# Patient Record
Sex: Female | Born: 1935
Health system: Southern US, Community
[De-identification: ages and names within clinical notes are randomized; demographics above are authoritative.]

## PROBLEM LIST (undated history)

## (undated) DIAGNOSIS — D696 Thrombocytopenia, unspecified: Secondary | ICD-10-CM

## (undated) DIAGNOSIS — Z9289 Personal history of other medical treatment: Secondary | ICD-10-CM

## (undated) DIAGNOSIS — R011 Cardiac murmur, unspecified: Secondary | ICD-10-CM

## (undated) DIAGNOSIS — C50912 Malignant neoplasm of unspecified site of left female breast: Secondary | ICD-10-CM

## (undated) DIAGNOSIS — M109 Gout, unspecified: Secondary | ICD-10-CM

## (undated) DIAGNOSIS — Z952 Presence of prosthetic heart valve: Secondary | ICD-10-CM

## (undated) DIAGNOSIS — E119 Type 2 diabetes mellitus without complications: Secondary | ICD-10-CM

## (undated) DIAGNOSIS — E6609 Other obesity due to excess calories: Secondary | ICD-10-CM

## (undated) DIAGNOSIS — E785 Hyperlipidemia, unspecified: Secondary | ICD-10-CM

## (undated) DIAGNOSIS — I5032 Chronic diastolic (congestive) heart failure: Secondary | ICD-10-CM

## (undated) DIAGNOSIS — I4819 Other persistent atrial fibrillation: Secondary | ICD-10-CM

## (undated) DIAGNOSIS — I6529 Occlusion and stenosis of unspecified carotid artery: Secondary | ICD-10-CM

## (undated) DIAGNOSIS — I10 Essential (primary) hypertension: Secondary | ICD-10-CM

## (undated) DIAGNOSIS — N189 Chronic kidney disease, unspecified: Secondary | ICD-10-CM

## (undated) DIAGNOSIS — K649 Unspecified hemorrhoids: Secondary | ICD-10-CM

## (undated) DIAGNOSIS — IMO0002 Reserved for concepts with insufficient information to code with codable children: Secondary | ICD-10-CM

## (undated) DIAGNOSIS — Z87442 Personal history of urinary calculi: Secondary | ICD-10-CM

## (undated) DIAGNOSIS — I447 Left bundle-branch block, unspecified: Secondary | ICD-10-CM

## (undated) DIAGNOSIS — I5020 Unspecified systolic (congestive) heart failure: Secondary | ICD-10-CM

## (undated) DIAGNOSIS — I350 Nonrheumatic aortic (valve) stenosis: Secondary | ICD-10-CM

## (undated) DIAGNOSIS — I2510 Atherosclerotic heart disease of native coronary artery without angina pectoris: Secondary | ICD-10-CM

## (undated) DIAGNOSIS — M1990 Unspecified osteoarthritis, unspecified site: Secondary | ICD-10-CM

## (undated) HISTORY — PX: SHOULDER ARTHROSCOPY W/ ROTATOR CUFF REPAIR: SHX2400

## (undated) HISTORY — DX: Left bundle-branch block, unspecified: I44.7

## (undated) HISTORY — DX: Cardiac murmur, unspecified: R01.1

## (undated) HISTORY — PX: JOINT REPLACEMENT: SHX530

## (undated) HISTORY — PX: CATARACT EXTRACTION W/ INTRAOCULAR LENS  IMPLANT, BILATERAL: SHX1307

## (undated) HISTORY — DX: Other obesity due to excess calories: E66.09

## (undated) HISTORY — PX: BREAST BIOPSY: SHX20

## (undated) HISTORY — PX: COLONOSCOPY: SHX5424

## (undated) HISTORY — PX: EYE SURGERY: SHX253

## (undated) HISTORY — PX: REPLACEMENT UNICONDYLAR JOINT KNEE: SUR1227

## (undated) HISTORY — DX: Hyperlipidemia, unspecified: E78.5

## (undated) HISTORY — DX: Essential (primary) hypertension: I10

## (undated) HISTORY — PX: TONSILLECTOMY: SUR1361

## (undated) HISTORY — DX: Thrombocytopenia, unspecified: D69.6

## (undated) HISTORY — PX: DILATION AND CURETTAGE OF UTERUS: SHX78

## (undated) HISTORY — DX: Other persistent atrial fibrillation: I48.19

## (undated) HISTORY — PX: OTHER SURGICAL HISTORY: SHX169

## (undated) NOTE — *Deleted (*Deleted)
Progress Note  Patient Name: Carrie Monroe Date of Encounter: 03/14/2020  Primary Cardiologist: Sanda Klein, MD   Subjective   ***  Inpatient Medications    Scheduled Meds: . amiodarone  400 mg Oral BID  . apixaban  2.5 mg Oral BID  . digoxin  0.125 mg Oral QODAY  . metoprolol tartrate  12.5 mg Oral BID  . polyethylene glycol  17 g Oral BID  . potassium chloride  40 mEq Oral BID  . rosuvastatin  10 mg Oral Daily  . sodium chloride flush  3 mL Intravenous Q12H   Continuous Infusions: . sodium chloride     PRN Meds: sodium chloride, acetaminophen, docusate sodium, ondansetron (ZOFRAN) IV, sodium chloride flush   Vital Signs    Vitals:   03/13/20 2315 03/14/20 0419 03/14/20 0457 03/14/20 0804  BP: (!) 113/51 (!) 124/47  (!) 113/44  Pulse: 71 75  73  Resp: 19 20  20   Temp: 98.5 F (36.9 C) (!) 97.2 F (36.2 C)  98 F (36.7 C)  TempSrc: Oral Oral  Oral  SpO2: 96% 95%  94%  Weight:   94.3 kg   Height:        Intake/Output Summary (Last 24 hours) at 03/14/2020 0920 Last data filed at 03/14/2020 0430 Gross per 24 hour  Intake 446 ml  Output 1420 ml  Net -974 ml   Filed Weights   03/12/20 0609 03/13/20 0700 03/14/20 0457  Weight: 92.4 kg 93.8 kg 94.3 kg    Physical Exam   GEN: Well nourished, well developed, in no acute distress.  HEENT: Grossly normal.  Neck: Supple, no JVD, carotid bruits, or masses. Cardiac: RRR, no murmurs, rubs, or gallops. No clubbing, cyanosis, edema.  Radials/DP/PT 2+ and equal bilaterally.  Respiratory:  Respirations regular and unlabored, clear to auscultation bilaterally. GI: Soft, nontender, nondistended, BS + x 4. MS: no deformity or atrophy. Skin: warm and dry, no rash. Neuro:  Strength and sensation are intact. Psych: AAOx3.  Normal affect.  Labs    Chemistry Recent Labs  Lab 03/11/20 0146 03/12/20 0057 03/14/20 0153  NA 135 137 142  K 4.6 4.1 5.0  CL 90* 93* 102  CO2 36* 33* 33*  GLUCOSE 144*  133* 131*  BUN 66* 67* 67*  CREATININE 1.71* 2.14* 2.22*  CALCIUM 9.3 9.3 9.6  GFRNONAA 27* 21* 20*  ANIONGAP 9 11 7      Hematology Recent Labs  Lab 03/13/20 0836 03/14/20 0153  WBC 5.5 5.4  RBC 4.05 3.96  HGB 13.2 12.5  HCT 41.1 40.2  MCV 101.5* 101.5*  MCH 32.6 31.6  MCHC 32.1 31.1  RDW 16.1* 16.1*  PLT 105* 103*    Cardiac EnzymesNo results for input(s): TROPONINI in the last 168 hours. No results for input(s): TROPIPOC in the last 168 hours.   BNPNo results for input(s): BNP, PROBNP in the last 168 hours.   DDimer No results for input(s): DDIMER in the last 168 hours.   Radiology    No results found.  Telemetry    *** - Personally Reviewed  ECG    No new tracing as of 03/14/20 - Personally Reviewed  Cardiac Studies   Echo:  1. Left ventricular ejection fraction, by estimation, is 40 to 45%. The  left ventricle has mildly decreased function. The left ventricle  demonstrates global hypokinesis. There is mild concentric left ventricular  hypertrophy. Left ventricular diastolic  function could not be evaluated.  2. Right ventricular  systolic function is mildly reduced. The right  ventricular size is mildly enlarged. There is moderately elevated  pulmonary artery systolic pressure. The estimated right ventricular  systolic pressure is 0000000 mmHg.  3. Left atrial size was severely dilated.  4. Right atrial size was severely dilated.  5. The mitral valve is normal in structure. Mild to moderate mitral valve  regurgitation.  6. The aortic valve has been repaired/replaced. Aortic valve  regurgitation is not visualized. There is a 26 mm Medtronic  CoreValve-EvolutR prosthetic (TAVR) valve present in the aortic position.  Procedure Date: September 2018. Echo findings are  consistent with normal structure and function of the aortic valve  prosthesis.  7. The inferior vena cava is dilated in size with <50% respiratory  variability, suggesting right  atrial pressure of 15 mmHg.   Comparison(s): Changes from prior study are noted. The left ventricular  function is worsened.   Patient Profile     76 y.o. female admitted for decompensated heart failure and atrial fibrillation with rapid ventricular response.  History of severe AS status post TAVR 01/2017 with 26 mm Medtronic CoreValve evolute pro, CAD with multivessel PCI in [id number], systolic heart failure with reduced ejection fraction EF 40 to 45%, persistent atrial fibrillation on Eliquis, breast cancer s/p lumpectomy and history of radiation, DM2, hypertension,and  hyperlipidemia.  Assessment & Plan    1. Persistent atrial fibrillation: -Rate currently well controlled on digoxin, metoprolol and amiodarone  -CHA2DS2VASc 6 (HF, HTN, age x2, CVD, female) -Continue Eliquis 2.5mg  BID due to renal dysfunction \   2. Acute on chronic systolic HF: -    3. Acute on chronic kidney disease: -Baseline creatinine appears to be in the 1.4 range -Previously on Lasix infusion        2.     Persistent atrial fibrillation- CHA2DS2-VASc score is 6 (heart failure, hypertension, age x2, CAD, female sex) -Continue Eliquis, 2.5 mg twice daily-reduced dose due to age and renal function -Rate is currently well controlled, continue digoxin and metoprolol as well as amiodarone.  When better compensated from a heart failure standpoint will consider DCCV, likely early next week.  Acute on chronic systolic and diastolic heart failure- AKI on CKD, baseline Cr 1.4 - previously on lasix drip. Cr increased yesterday so Lasix held. Volume status is stable. Will give bolus dose of lasix 80 mg IV now, will reassess for oral diuretics in AM with morning labs available. Patient declines repeat lab draw today due to unpleasant experience this morning.  CAD status post PCI in 2018 -Continues on DOAC.  Not currently on aspirin -Continue statin and beta-blocker  Aortic stenosis status post TAVR -Stable per  last echo.     Signed, Kathyrn Drown NP-C [facility] Pager: [phone] 03/14/2020, 9:20 AM     For questions or updates, please contact   Please consult [email or web address] for contact info under Cardiology/STEMI.

---

## 1998-09-29 ENCOUNTER — Ambulatory Visit (HOSPITAL_BASED_OUTPATIENT_CLINIC_OR_DEPARTMENT_OTHER): Admission: RE | Admit: 1998-09-29 | Discharge: 1998-09-29 | Payer: Self-pay | Admitting: Orthopedic Surgery

## 2000-08-06 ENCOUNTER — Other Ambulatory Visit: Admission: RE | Admit: 2000-08-06 | Discharge: 2000-08-06 | Payer: Self-pay | Admitting: *Deleted

## 2000-08-12 ENCOUNTER — Ambulatory Visit (HOSPITAL_COMMUNITY): Admission: RE | Admit: 2000-08-12 | Discharge: 2000-08-12 | Payer: Self-pay | Admitting: *Deleted

## 2002-10-28 ENCOUNTER — Encounter: Payer: Self-pay | Admitting: Orthopedic Surgery

## 2002-11-02 ENCOUNTER — Inpatient Hospital Stay (HOSPITAL_COMMUNITY): Admission: RE | Admit: 2002-11-02 | Discharge: 2002-11-06 | Payer: Self-pay | Admitting: Orthopedic Surgery

## 2002-11-02 ENCOUNTER — Encounter: Payer: Self-pay | Admitting: Orthopedic Surgery

## 2007-05-29 DIAGNOSIS — C50912 Malignant neoplasm of unspecified site of left female breast: Secondary | ICD-10-CM

## 2007-05-29 HISTORY — DX: Malignant neoplasm of unspecified site of left female breast: C50.912

## 2007-10-14 ENCOUNTER — Encounter: Admission: RE | Admit: 2007-10-14 | Discharge: 2007-10-14 | Payer: Self-pay | Admitting: Internal Medicine

## 2007-10-23 ENCOUNTER — Ambulatory Visit: Payer: Self-pay | Admitting: Internal Medicine

## 2007-11-03 ENCOUNTER — Encounter: Admission: RE | Admit: 2007-11-03 | Discharge: 2007-11-03 | Payer: Self-pay | Admitting: General Surgery

## 2007-11-11 ENCOUNTER — Encounter: Admission: RE | Admit: 2007-11-11 | Discharge: 2007-11-11 | Payer: Self-pay | Admitting: General Surgery

## 2007-11-26 ENCOUNTER — Ambulatory Visit (HOSPITAL_BASED_OUTPATIENT_CLINIC_OR_DEPARTMENT_OTHER): Admission: RE | Admit: 2007-11-26 | Discharge: 2007-11-26 | Payer: Self-pay | Admitting: Surgery

## 2007-11-26 ENCOUNTER — Encounter (INDEPENDENT_AMBULATORY_CARE_PROVIDER_SITE_OTHER): Payer: Self-pay | Admitting: Surgery

## 2007-11-26 HISTORY — PX: BREAST LUMPECTOMY: SHX2

## 2007-12-11 ENCOUNTER — Ambulatory Visit: Payer: Self-pay | Admitting: Oncology

## 2007-12-25 LAB — CBC WITH DIFFERENTIAL/PLATELET
BASO%: 0.2 % (ref 0.0–2.0)
EOS%: 3.2 % (ref 0.0–7.0)
MCH: 31.2 pg (ref 26.0–34.0)
MCHC: 34.9 g/dL (ref 32.0–36.0)
NEUT%: 54.3 % (ref 39.6–76.8)
RDW: 12.9 % (ref 11.3–14.5)
lymph#: 2.1 10*3/uL (ref 0.9–3.3)

## 2007-12-26 LAB — COMPREHENSIVE METABOLIC PANEL
ALT: 22 U/L (ref 0–35)
AST: 21 U/L (ref 0–37)
Calcium: 10.1 mg/dL (ref 8.4–10.5)
Chloride: 105 mEq/L (ref 96–112)
Creatinine, Ser: 0.77 mg/dL (ref 0.40–1.20)
Potassium: 4.2 mEq/L (ref 3.5–5.3)

## 2007-12-30 ENCOUNTER — Ambulatory Visit: Admission: RE | Admit: 2007-12-30 | Discharge: 2008-02-24 | Payer: Self-pay | Admitting: Radiation Oncology

## 2007-12-30 ENCOUNTER — Ambulatory Visit (HOSPITAL_COMMUNITY): Admission: RE | Admit: 2007-12-30 | Discharge: 2007-12-30 | Payer: Self-pay | Admitting: Oncology

## 2008-01-06 ENCOUNTER — Ambulatory Visit (HOSPITAL_COMMUNITY): Admission: RE | Admit: 2008-01-06 | Discharge: 2008-01-06 | Payer: Self-pay | Admitting: Oncology

## 2008-03-24 ENCOUNTER — Ambulatory Visit: Payer: Self-pay | Admitting: Oncology

## 2008-05-05 ENCOUNTER — Encounter: Admission: RE | Admit: 2008-05-05 | Discharge: 2008-05-05 | Payer: Self-pay | Admitting: Internal Medicine

## 2008-05-13 ENCOUNTER — Ambulatory Visit: Payer: Self-pay | Admitting: Oncology

## 2008-05-17 LAB — CBC WITH DIFFERENTIAL/PLATELET
BASO%: 0.2 % (ref 0.0–2.0)
HCT: 40.3 % (ref 34.8–46.6)
MCHC: 34.8 g/dL (ref 32.0–36.0)
MONO#: 0.6 10*3/uL (ref 0.1–0.9)
NEUT#: 4.3 10*3/uL (ref 1.5–6.5)
RBC: 4.41 10*6/uL (ref 3.70–5.32)
lymph#: 1.5 10*3/uL (ref 0.9–3.3)

## 2008-05-18 LAB — COMPREHENSIVE METABOLIC PANEL
ALT: 25 U/L (ref 0–35)
Albumin: 4.1 g/dL (ref 3.5–5.2)
CO2: 19 mEq/L (ref 19–32)
Calcium: 9.3 mg/dL (ref 8.4–10.5)
Chloride: 107 mEq/L (ref 96–112)
Sodium: 141 mEq/L (ref 135–145)
Total Protein: 7.4 g/dL (ref 6.0–8.3)

## 2008-05-18 LAB — VITAMIN D 25 HYDROXY (VIT D DEFICIENCY, FRACTURES): Vit D, 25-Hydroxy: 32 ng/mL (ref 30–89)

## 2008-05-28 HISTORY — PX: CHOLECYSTECTOMY: SHX55

## 2008-08-17 ENCOUNTER — Encounter: Admission: RE | Admit: 2008-08-17 | Discharge: 2008-08-17 | Payer: Self-pay | Admitting: Surgery

## 2008-09-10 ENCOUNTER — Ambulatory Visit: Payer: Self-pay | Admitting: Oncology

## 2008-09-14 LAB — CBC WITH DIFFERENTIAL/PLATELET
BASO%: 0.4 % (ref 0.0–2.0)
Eosinophils Absolute: 0.1 10*3/uL (ref 0.0–0.5)
MONO#: 0.5 10*3/uL (ref 0.1–0.9)
NEUT#: 4.3 10*3/uL (ref 1.5–6.5)
RBC: 4.4 10*6/uL (ref 3.70–5.45)
RDW: 13.7 % (ref 11.2–14.5)
WBC: 6.6 10*3/uL (ref 3.9–10.3)

## 2008-09-15 LAB — COMPREHENSIVE METABOLIC PANEL
ALT: 19 U/L (ref 0–35)
Albumin: 4.3 g/dL (ref 3.5–5.2)
Alkaline Phosphatase: 97 U/L (ref 39–117)
CO2: 20 mEq/L (ref 19–32)
Glucose, Bld: 106 mg/dL — ABNORMAL HIGH (ref 70–99)
Potassium: 4.3 mEq/L (ref 3.5–5.3)
Sodium: 139 mEq/L (ref 135–145)
Total Protein: 7.6 g/dL (ref 6.0–8.3)

## 2008-09-15 LAB — CANCER ANTIGEN 27.29: CA 27.29: 13 U/mL (ref 0–39)

## 2008-09-15 LAB — LACTATE DEHYDROGENASE: LDH: 138 U/L (ref 94–250)

## 2008-10-26 ENCOUNTER — Encounter: Admission: RE | Admit: 2008-10-26 | Discharge: 2008-10-26 | Payer: Self-pay | Admitting: Surgery

## 2009-01-18 ENCOUNTER — Encounter (INDEPENDENT_AMBULATORY_CARE_PROVIDER_SITE_OTHER): Payer: Self-pay | Admitting: Surgery

## 2009-01-18 ENCOUNTER — Ambulatory Visit (HOSPITAL_COMMUNITY): Admission: RE | Admit: 2009-01-18 | Discharge: 2009-01-19 | Payer: Self-pay | Admitting: Surgery

## 2009-02-17 ENCOUNTER — Ambulatory Visit: Payer: Self-pay | Admitting: Oncology

## 2009-02-22 LAB — CBC WITH DIFFERENTIAL/PLATELET
BASO%: 0.5 % (ref 0.0–2.0)
Eosinophils Absolute: 0.2 10*3/uL (ref 0.0–0.5)
HCT: 38.8 % (ref 34.8–46.6)
LYMPH%: 26.1 % (ref 14.0–49.7)
MCHC: 34.5 g/dL (ref 31.5–36.0)
MONO#: 0.7 10*3/uL (ref 0.1–0.9)
NEUT%: 61 % (ref 38.4–76.8)
Platelets: 195 10*3/uL (ref 145–400)
WBC: 6.9 10*3/uL (ref 3.9–10.3)

## 2009-02-23 LAB — COMPREHENSIVE METABOLIC PANEL
BUN: 22 mg/dL (ref 6–23)
CO2: 19 mEq/L (ref 19–32)
Creatinine, Ser: 1.04 mg/dL (ref 0.40–1.20)
Glucose, Bld: 123 mg/dL — ABNORMAL HIGH (ref 70–99)
Total Bilirubin: 0.5 mg/dL (ref 0.3–1.2)

## 2009-02-23 LAB — LACTATE DEHYDROGENASE: LDH: 135 U/L (ref 94–250)

## 2009-02-23 LAB — VITAMIN D 25 HYDROXY (VIT D DEFICIENCY, FRACTURES): Vit D, 25-Hydroxy: 43 ng/mL (ref 30–89)

## 2009-02-23 LAB — CANCER ANTIGEN 27.29: CA 27.29: 17 U/mL (ref 0–39)

## 2009-08-19 ENCOUNTER — Ambulatory Visit: Payer: Self-pay | Admitting: Oncology

## 2009-08-23 LAB — CBC WITH DIFFERENTIAL/PLATELET
BASO%: 0.6 % (ref 0.0–2.0)
EOS%: 3.3 % (ref 0.0–7.0)
MCH: 30.4 pg (ref 25.1–34.0)
MCHC: 32.5 g/dL (ref 31.5–36.0)
MONO#: 0.5 10*3/uL (ref 0.1–0.9)
RBC: 4.08 10*6/uL (ref 3.70–5.45)
RDW: 13.9 % (ref 11.2–14.5)
WBC: 6.4 10*3/uL (ref 3.9–10.3)
lymph#: 1.7 10*3/uL (ref 0.9–3.3)
nRBC: 0 % (ref 0–0)

## 2009-08-23 LAB — COMPREHENSIVE METABOLIC PANEL
ALT: 16 U/L (ref 0–35)
AST: 16 U/L (ref 0–37)
Albumin: 3.7 g/dL (ref 3.5–5.2)
Alkaline Phosphatase: 97 U/L (ref 39–117)
BUN: 22 mg/dL (ref 6–23)
Potassium: 4.2 mEq/L (ref 3.5–5.3)
Sodium: 139 mEq/L (ref 135–145)
Total Protein: 6.8 g/dL (ref 6.0–8.3)

## 2009-08-23 LAB — VITAMIN D 25 HYDROXY (VIT D DEFICIENCY, FRACTURES): Vit D, 25-Hydroxy: 32 ng/mL (ref 30–89)

## 2009-08-31 ENCOUNTER — Encounter: Admission: RE | Admit: 2009-08-31 | Discharge: 2009-08-31 | Payer: Self-pay | Admitting: Surgery

## 2009-10-31 ENCOUNTER — Encounter: Admission: RE | Admit: 2009-10-31 | Discharge: 2009-10-31 | Payer: Self-pay | Admitting: Surgery

## 2010-02-14 ENCOUNTER — Encounter (INDEPENDENT_AMBULATORY_CARE_PROVIDER_SITE_OTHER): Payer: Self-pay | Admitting: *Deleted

## 2010-03-17 ENCOUNTER — Encounter (INDEPENDENT_AMBULATORY_CARE_PROVIDER_SITE_OTHER): Payer: Self-pay

## 2010-03-21 ENCOUNTER — Ambulatory Visit: Payer: Self-pay | Admitting: Internal Medicine

## 2010-03-21 ENCOUNTER — Encounter (INDEPENDENT_AMBULATORY_CARE_PROVIDER_SITE_OTHER): Payer: Self-pay

## 2010-04-04 ENCOUNTER — Ambulatory Visit: Payer: Self-pay | Admitting: Internal Medicine

## 2010-04-08 ENCOUNTER — Encounter: Payer: Self-pay | Admitting: Internal Medicine

## 2010-05-12 ENCOUNTER — Ambulatory Visit: Payer: Self-pay | Admitting: Cardiology

## 2010-05-15 ENCOUNTER — Ambulatory Visit: Payer: Self-pay

## 2010-05-15 ENCOUNTER — Ambulatory Visit (HOSPITAL_COMMUNITY)
Admission: RE | Admit: 2010-05-15 | Discharge: 2010-05-15 | Payer: Self-pay | Source: Home / Self Care | Attending: Cardiology | Admitting: Cardiology

## 2010-05-15 ENCOUNTER — Encounter: Payer: Self-pay | Admitting: Cardiology

## 2010-05-15 HISTORY — PX: US ECHOCARDIOGRAPHY: HXRAD669

## 2010-05-16 ENCOUNTER — Encounter: Payer: Self-pay | Admitting: Cardiology

## 2010-06-18 ENCOUNTER — Encounter: Payer: Self-pay | Admitting: General Surgery

## 2010-06-27 NOTE — Procedures (Signed)
Summary: Colonoscopy  Patient: Carrie Monroe Note: All result statuses are Final unless otherwise noted.  Tests: (1) Colonoscopy (COL)   COL Colonoscopy           [facility] [employer]     [place], [place]  [address]           COLONOSCOPY PROCEDURE REPORT           PATIENT:  Carrie Monroe, Carrie Monroe  MR#:  [id number]     BIRTHDATE:  Mar 15, 1936, 74 yrs. old  GENDER:  female     ENDOSCOPIST:  Lowella Bandy. Olevia Perches, MD     REF. BY:  [name], M.D.     PROCEDURE DATE:  04/04/2010     PROCEDURE:  Colonoscopy [id number]     ASA CLASS:  Class II     INDICATIONS:  Routine Risk Screening     MEDICATIONS:   Versed 5 mg, Fentanyl 50 mcg           DESCRIPTION OF PROCEDURE:   After the risks benefits and     alternatives of the procedure were thoroughly explained, informed     consent was obtained.  Digital rectal exam was performed and     revealed no rectal masses.   The [id number] endoscope was     introduced through the anus and advanced to the cecum, which was     identified by both the appendix and ileocecal valve, without     limitations.  The quality of the prep was good, using MiraLax.     The instrument was then slowly withdrawn as the colon was fully     examined.     <<PROCEDUREIMAGES>>     FINDINGS:  There were multiple polyps identified and removed. 4     sessile polyps 5-8 mm at 100 cm, right colon,and one 4 mm polyp at     20 cm Polyps were snared without cautery. Retrieval was successful     (see image1, image2, image4, image5, image8, and image10). snare     polyp  Mild diverticulosis was found in the sigmoid colon (see     image9).  This was otherwise a normal examination of the colon     (see image6, image7, and image11).   Retroflexed views in the     rectum revealed no abnormalities.    The scope was then w     ithdrawn from the patient and the procedure completed.           COMPLICATIONS:  None     ENDOSCOPIC IMPRESSION:     1) Polyps,  multiple     2) Mild diverticulosis in the sigmoid colon     3) Otherwise normal examination     RECOMMENDATIONS:     1) Await pathology results     2) high fiber diet     no ASA or Aleve x 2 weeks     REPEAT EXAM:  In 3 year(s) for.           ______________________________     Lowella Bandy. Olevia Perches, MD           CC:           n.     eSIGNED:   Lowella Bandy. Brodie at 04/04/2010 09:43 AM           Emmaline Kluver, [id number]  Note: An exclamation mark (!) indicates a result that was  not dispersed into the flowsheet. Document Creation Date: 04/04/2010 9:44 AM _______________________________________________________________________  (1) Order result status: Final Collection or observation date-time: 04/04/2010 09:27 Requested date-time:  Receipt date-time:  Reported date-time:  Referring Physician:   Ordering Physician: Delfin Edis [id number]) Specimen Source:  Source: Tawanna Cooler Order Number: [id number] Lab site:   Appended Document: Colonoscopy     Procedures Next Due Date:    Colonoscopy: 03/2015

## 2010-06-27 NOTE — Letter (Signed)
Summary: Pre Visit Letter Revised  [facility]  [place], [place] [address]   Phone: [phone]  Fax: [phone]        02/14/2010 MRN: [id number] Carrie Monroe [address] [place], [place]  [address]             Procedure Date:  04-04-10   Welcome to the Gastroenterology Division at [facility].    You are scheduled to see a nurse for your pre-procedure visit on 03-21-10 at 11:00a.m. on the 3rd floor at [facility], [place] [employer].  We ask that you try to arrive at our office 15 minutes prior to your appointment time to allow for check-in.  Please take a minute to review the attached form.  If you answer "Yes" to one or more of the questions on the first page, we ask that you call the person listed at your earliest opportunity.  If you answer "No" to all of the questions, please complete the rest of the form and bring it to your appointment.    Your nurse visit will consist of discussing your medical and surgical history, your immediate family medical history, and your medications.   If you are unable to list all of your medications on the form, please bring the medication bottles to your appointment and we will list them.  We will need to be aware of both prescribed and over the counter drugs.  We will need to know exact dosage information as well.    Please be prepared to read and sign documents such as consent forms, a financial agreement, and acknowledgement forms.  If necessary, and with your consent, a friend or relative is welcome to sit-in on the nurse visit with you.  Please bring your insurance card so that we may make a copy of it.  If your insurance requires a referral to see a specialist, please bring your referral form from your primary care physician.  No co-pay is required for this nurse visit.     If you cannot keep your appointment, please call [phone] to cancel or reschedule prior to your appointment date.   This allows [place] the opportunity to schedule an appointment for another patient in need of care.    Thank you for choosing [facility] for your medical needs.  We appreciate the opportunity to care for you.  Please visit [place] at our website  to learn more about our practice.  Sincerely, The Gastroenterology Division

## 2010-06-27 NOTE — Letter (Signed)
Summary: Patient Notice- Polyp Results  [facility]  [address] [place], [place] [address]   Phone: [phone]  Fax: [phone]        April 08, 2010 MRN: [id number]    [place], [place]  [address]    Dear [name],  I am pleased to inform you that the colon polyp(s) removed during your recent colonoscopy was (were) found to be benign (no cancer detected) upon pathologic examination.The polyps were mostly hyperplastic ( not precancerous) and one was adenomatous ( precancerous)  I recommend you have a repeat colonoscopy examination in 5_ years to look for recurrent polyps, as having colon polyps increases your risk for having recurrent polyps or even colon cancer in the future.  Should you develop new or worsening symptoms of abdominal pain, bowel habit changes or bleeding from the rectum or bowels, please schedule an evaluation with either your primary care physician or with me.  Additional information/recommendations:  _x_ No further action with gastroenterology is needed at this time. Please      follow-up with your primary care physician for your other healthcare      needs.  __ Please call [phone] to schedule a return visit to review your      situation.  __ Please keep your follow-up visit as already scheduled.  __ Continue treatment plan as outlined the day of your exam.  Please call us if you are having persistent problems or have questions about your condition that have not been fully answered at this time.  Sincerely,  Lafayette Dragon MD  This letter has been electronically signed by your physician.  Appended Document: Patient Notice- Polyp Results Letter mailed

## 2010-06-27 NOTE — Letter (Signed)
Summary: [facility] Instructions  [facility]  [place], [place] [address]   Phone: [phone]  Fax: [phone]       Carrie Monroe    73-09-1935    MRN: [id number]       Procedure Day /Date: Tuesday 04-04-10     Arrival Time:  8:00 am     Procedure Time: 9:00 am     Location of Procedure:                    _x _  [facility] (4th Floor)   [place]  Starting 5 days prior to your procedure  03-30-10  do not eat nuts, seeds, popcorn, corn, beans, peas,  salads, or any raw vegetables.  Do not take any fiber supplements (e.g. Metamucil, Citrucel, and Benefiber). ____________________________________________________________________________________________________   THE DAY BEFORE YOUR PROCEDURE         DATE:  04-03-10  DAY:  Monday  1   Drink clear liquids the entire day-NO SOLID FOOD  2   Do not drink anything colored red or purple.  Avoid juices with pulp.  No orange juice.  3   Drink at least 64 oz. (8 glasses) of fluid/clear liquids during the day to prevent dehydration and help the prep work efficiently.  CLEAR LIQUIDS INCLUDE: Water Jello Ice Popsicles Tea (sugar ok, no milk/cream) Powdered fruit flavored drinks Coffee (sugar ok, no milk/cream) Gatorade Juice: apple, white grape, white cranberry  Lemonade Clear bullion, consomm, broth Carbonated beverages (any kind) Strained chicken noodle soup Hard Candy  4   Mix the entire bottle of Miralax with 64 oz. of Gatorade/Powerade in the morning and put in the refrigerator to chill.  5   At 3:00 pm take 2 Dulcolax/Bisacodyl tablets.  6   At 4:30 pm take one Reglan/Metoclopramide tablet.  7  Starting at 5:00 pm drink one 8 oz glass of the Miralax mixture every 15-20 minutes until you have finished drinking the entire 64 oz.  You should finish drinking prep around 7:30 or 8:00 pm.  8   If you are nauseated, you may take the 2nd  Reglan/Metoclopramide tablet at 6:30 pm.        9    At 8:00 pm take 2 more DULCOLAX/Bisacodyl tablets.     THE DAY OF YOUR PROCEDURE      DATE:   04-04-10  DAY:  Tuesday  You may drink clear liquids until  7:00 a.m. (2 HOURS BEFORE PROCEDURE).   MEDICATION INSTRUCTIONS  Unless otherwise instructed, you should take regular prescription medications with a small sip of water as early as possible the morning of your procedure.  Diabetic patients - see separate instructions.         OTHER INSTRUCTIONS  You will need a responsible adult at least 73 years of age to accompany you and drive you home.   This person must remain in the waiting room during your procedure.  Wear loose fitting clothing that is easily removed.  Leave jewelry and other valuables at home.  However, you may wish to bring a book to read or an iPod/MP3 player to listen to music as you wait for your procedure to start.  Remove all body piercing jewelry and leave at home.  Total time from sign-in until discharge is approximately 2-3 hours.  You should go home directly after your procedure and rest.  You can resume normal activities the day after your procedure.  The day of your procedure you should not:   Drive   Make legal decisions   Operate machinery   Drink alcohol   Return to work  You will receive specific instructions about eating, activities and medications before you leave.   The above instructions have been reviewed and explained to me by   Cornelia Copa RN  March 21, 2010 11:38 AM     I fully understand and can verbalize these instructions _____________________________ Date _______

## 2010-06-27 NOTE — Miscellaneous (Signed)
Summary: Lec previsit  Clinical Lists Changes  Medications: Added new medication of MIRALAX   POWD (POLYETHYLENE GLYCOL 3350) As per prep  instructions. - Signed Added new medication of REGLAN 10 MG  TABS (METOCLOPRAMIDE HCL) As per prep instructions. - Signed Added new medication of DULCOLAX 5 MG  TBEC (BISACODYL) Day before procedure take 2 at 3pm and 2 at 8pm. - Signed Rx of MIRALAX   POWD (POLYETHYLENE GLYCOL 3350) As per prep  instructions.;  #255gm x 0;  Signed;  Entered by: Cornelia Copa RN;  Authorized by: Lafayette Dragon MD;  Method used: Electronically to [facility] [place] [place], [place]  [address], Ph: [phone] or [phone], Fax: [phone] Rx of REGLAN 10 MG  TABS (METOCLOPRAMIDE HCL) As per prep instructions.;  #2 x 0;  Signed;  Entered by: Cornelia Copa RN;  Authorized by: Lafayette Dragon MD;  Method used: Electronically to [facility] [place] [place], [place]  [address], Ph: [phone] or [phone], Fax: [phone] Rx of DULCOLAX 5 MG  TBEC (BISACODYL) Day before procedure take 2 at 3pm and 2 at 8pm.;  #4 x 0;  Signed;  Entered by: Cornelia Copa RN;  Authorized by: Lafayette Dragon MD;  Method used: Electronically to [facility] [place], [place], [place]  [address], Ph: [phone] or [phone], Fax: [phone] Observations: Added new observation of NKA: T (03/21/2010 10:46)    Prescriptions: DULCOLAX 5 MG  TBEC (BISACODYL) Day before procedure take 2 at 3pm and 2 at 8pm.  #4 x 0   Entered by:   Cornelia Copa RN   Authorized by:   Lafayette Dragon MD   Signed by:   Cornelia Copa RN on 03/21/2010   Method used:   Electronically to        [facility] (retail)       [place] [place], [place]  [address]       Ph: [phone] or  [phone]       Fax: [phone]   RxID:   [id number] REGLAN 10 MG  TABS (METOCLOPRAMIDE HCL) As per prep instructions.  #2 x 0   Entered by:   Cornelia Copa RN   Authorized by:   Lafayette Dragon MD   Signed by:   Cornelia Copa RN on 03/21/2010   Method used:   Electronically to        [facility] (retail)       [place] [place],   [address]       Ph: [phone] or [phone]       Fax: [phone]   RxID:   [id number] MIRALAX   POWD (POLYETHYLENE GLYCOL 3350) As per prep  instructions.  #255gm x 0   Entered by:   Cornelia Copa RN   Authorized by:   Lafayette Dragon MD   Signed by:   Cornelia Copa RN on 03/21/2010   Method used:   Electronically to        [facility] (retail)       [place] [place], [place]  [address]       Ph: [phone] or [phone]       Fax: [phone]   RxID:   [id number]

## 2010-06-27 NOTE — Letter (Signed)
Summary: Diabetic Instructions  [facility]  [place], [place] [address]   Phone: [phone]  Fax: [phone]    Carrie Monroe July 31, 1935 MRN: [id number]   _  _   ORAL DIABETIC MEDICATION INSTRUCTIONS  The day before your procedure:   Take your diabetic pill as you do normally  The day of your procedure:   Do not take your diabetic pill    We will check your blood sugar levels during the admission process and again in Recovery before discharging you home  ________________________________________________________________________  _  _   INSULIN (LONG ACTING) MEDICATION INSTRUCTIONS (Lantus, NPH, 70/30, Humulin, Novolin-N)   The day before your procedure:   Take  your regular evening dose    The day of your procedure:   Do not take your morning dose    _  _   INSULIN (SHORT ACTING) MEDICATION INSTRUCTIONS (Regular, Humulog, Novolog)   The day before your procedure:   Do not take your evening dose   The day of your procedure:   Do not take your morning dose   _  _   INSULIN PUMP MEDICATION INSTRUCTIONS  We will contact the physician managing your diabetic care for written dosage instructions for the day before your procedure and the day of your procedure.  Once we have received the instructions, we will contact you.

## 2010-08-24 ENCOUNTER — Other Ambulatory Visit: Payer: Self-pay | Admitting: Oncology

## 2010-08-24 ENCOUNTER — Encounter (HOSPITAL_BASED_OUTPATIENT_CLINIC_OR_DEPARTMENT_OTHER): Payer: Medicare Other | Admitting: Oncology

## 2010-08-24 DIAGNOSIS — Z170 Estrogen receptor positive status [ER+]: Secondary | ICD-10-CM

## 2010-08-24 DIAGNOSIS — C50419 Malignant neoplasm of upper-outer quadrant of unspecified female breast: Secondary | ICD-10-CM

## 2010-08-24 LAB — CBC WITH DIFFERENTIAL/PLATELET
Eosinophils Absolute: 0.2 10*3/uL (ref 0.0–0.5)
HGB: 12.2 g/dL (ref 11.6–15.9)
LYMPH%: 29.8 % (ref 14.0–49.7)
MONO#: 0.6 10*3/uL (ref 0.1–0.9)
NEUT#: 3.2 10*3/uL (ref 1.5–6.5)
Platelets: 149 10*3/uL (ref 145–400)
RBC: 3.79 10*6/uL (ref 3.70–5.45)
RDW: 13.2 % (ref 11.2–14.5)
WBC: 5.7 10*3/uL (ref 3.9–10.3)

## 2010-08-25 LAB — COMPREHENSIVE METABOLIC PANEL
Albumin: 3.7 g/dL (ref 3.5–5.2)
CO2: 19 mEq/L (ref 19–32)
Glucose, Bld: 146 mg/dL — ABNORMAL HIGH (ref 70–99)
Potassium: 4.3 mEq/L (ref 3.5–5.3)
Sodium: 138 mEq/L (ref 135–145)
Total Protein: 6.8 g/dL (ref 6.0–8.3)

## 2010-08-25 LAB — LACTATE DEHYDROGENASE: LDH: 156 U/L (ref 94–250)

## 2010-08-25 LAB — CANCER ANTIGEN 27.29: CA 27.29: 15 U/mL (ref 0–39)

## 2010-08-31 ENCOUNTER — Other Ambulatory Visit: Payer: Self-pay | Admitting: Oncology

## 2010-08-31 ENCOUNTER — Encounter (HOSPITAL_BASED_OUTPATIENT_CLINIC_OR_DEPARTMENT_OTHER): Payer: Medicare Other | Admitting: Oncology

## 2010-08-31 DIAGNOSIS — C50419 Malignant neoplasm of upper-outer quadrant of unspecified female breast: Secondary | ICD-10-CM

## 2010-08-31 DIAGNOSIS — Z9889 Other specified postprocedural states: Secondary | ICD-10-CM

## 2010-08-31 DIAGNOSIS — Z170 Estrogen receptor positive status [ER+]: Secondary | ICD-10-CM

## 2010-09-02 LAB — DIFFERENTIAL
Eosinophils Relative: 2 % (ref 0–5)
Lymphocytes Relative: 22 % (ref 12–46)
Lymphs Abs: 1.4 10*3/uL (ref 0.7–4.0)

## 2010-09-02 LAB — COMPREHENSIVE METABOLIC PANEL
AST: 22 U/L (ref 0–37)
Albumin: 3.7 g/dL (ref 3.5–5.2)
Calcium: 9.9 mg/dL (ref 8.4–10.5)
Creatinine, Ser: 0.88 mg/dL (ref 0.4–1.2)
GFR calc Af Amer: >60 mL/min (ref >60)
GFR calc non Af Amer: >60 mL/min (ref >60)

## 2010-09-02 LAB — CBC
MCHC: 34.7 g/dL (ref 30.0–36.0)
RDW: 14.3 % (ref 11.5–15.5)

## 2010-09-25 ENCOUNTER — Ambulatory Visit (INDEPENDENT_AMBULATORY_CARE_PROVIDER_SITE_OTHER): Payer: No Typology Code available for payment source | Admitting: Psychiatry

## 2010-09-25 DIAGNOSIS — F329 Major depressive disorder, single episode, unspecified: Secondary | ICD-10-CM

## 2010-10-10 NOTE — Discharge Summary (Signed)
NAMECHARLI, [name]           ACCOUNT NO.:  [id number]   MEDICAL RECORD NO.:  [id number]          PATIENT TYPE:  OIB   LOCATION:  [id number]                         FACILITY[facility]   PHYSICIAN:  Joyice Faster. Cornett, M.D.DATE OF BIRTH:  February 05, 1936   DATE OF ADMISSION:  01/18/2009  DATE OF DISCHARGE:  01/19/2009                               DISCHARGE SUMMARY   ADMISSION DIAGNOSES:  1. Symptomatic cholelithiasis.  2. Umbilical hernia.   DISCHARGE DIAGNOSES:  1. Symptomatic cholelithiasis.  2. Umbilical hernia.   PROCEDURE PERFORMED:  Laparoscopic cholecystectomy with cholangiogram  and repair of umbilical hernia.   BRIEF HISTORY:  The patient is a 75 year old female admitted after  laparoscopic cholecystectomy and umbilical hernia repair.   HOSPITAL COURSE:  Unremarkable.   DISCHARGE INSTRUCTIONS:  She will be discharged home on Percocet one to  two tablets q.4h. p.r.n. pain and follow up in 2-3 weeks.  She will  drive in [id number] days.  To resume a regular diet and may shower today.  She  will refrain from any heavy lifting until I see her back in the office,  above 10 pounds.   CONDITION ON DISCHARGE:  Good.   Her medical reconciliation list was reviewed and signed by me.  Please  see this for her complete list of discharge medications.      Thomas A. Cornett, M.D.  Electronically Signed     TAC/MEDQ  D:  01/19/2009  T:  01/19/2009  Job:  [id number]

## 2010-10-10 NOTE — Op Note (Signed)
NAMEHERTHA, Carrie Monroe           ACCOUNT NO.:  [id number]   MEDICAL RECORD NO.:  [id number]          PATIENT TYPE:  AMB   LOCATION:  [place]                          FACILITY:  [facility]   PHYSICIAN:  Thomas A. Cornett, M.D.DATE OF BIRTH:  November 07, 1935   DATE OF PROCEDURE:  11/26/2007  DATE OF DISCHARGE:                               OPERATIVE REPORT   PREOPERATIVE DIAGNOSIS:  Left breast cancer.   POSTOPERATIVE DIAGNOSIS:  Left breast cancer.   PROCEDURES:  1. Left breast needle-localized lumpectomy.  2. Left axillary sentinel lymph node mapping with injection of      methylene blue dye.   SURGEON:  Marcello Moores A. Malachi Paradise, MD   ASSISTANT:  Yetta Glassman, PA, student.   ANESTHESIA:  LMA with 0.25% Sensorcaine local.   ESTIMATED BLOOD LOSS:  30 mL.   SPECIMEN:  1. Three left axillary sentinel nodes negative by touch prep.  2. Left lumpectomy specimen wire and clip found to be present by      radiography.   DRAINS:  None.   INDICATIONS FOR PROCEDURE:  The patient is a 75 year old female found to  have a T1c N0 stage I left breast cancer.  She wished to undergo breast  conserving surgery and presents today for that.   DESCRIPTION OF PROCEDURE:  The patient was brought to the operating room  and placed supine.  She underwent needle localization and injection of  technetium sulfur colloid prior to coming to the operating room.  After  induction of LMA anesthesia, the left breast and left axilla were  prepped and draped in a sterile fashion.  The lumpectomy was done first.  The wire exited the left lateral breast.  I infiltrated the left lateral  breast with 0.25% Sensorcaine.  Curvilinear incision was made.  Wire was  brought out the incision.  Of note, 4 mL of methylene blue dye were  injected in a subareolar position prior to doing this and massaged for 5  minutes.  The entire area was excised with the wire in its entirety.  We  then were able to access the left axillary nodes through  the left  axilla.  Neoprobe was used.  There were 2 hot 9 blue sentinel nodes  discovered.  A 3 node felt abnormal when I took it, but it was not a  sentinel node.  All 3 nodes were sent for touch prep and were negative.  Hemostasis was achieved.  Surgicel was used in the axilla due to some  oozing and did a very nice trap to control any oozing.  Irrigation was  used and suctioned out.  Hemostasis otherwise was excellent.  I closed  the deep layer of the breast with 3-0 Vicryl.  A 4-0 Monocryl was then used to close the skin in a subcuticular  fashion.  All final counts of sponge, needle and instruments were found  to be correct at this portion of the case.  Dermabond was applied.  The  patient was awoke, taken to recovery in satisfactory condition.      Thomas A. Cornett, M.D.  Electronically Signed     TAC/MEDQ  D:  11/26/2007  T:  11/27/2007  Job:  [id number]   cc:   Marton Redwood, Dr.

## 2010-10-10 NOTE — Op Note (Signed)
NAMEBRANDA, FORST           ACCOUNT NO.:  [id number]   MEDICAL RECORD NO.:  [id number]          PATIENT TYPE:  AMB   LOCATION:  DAY                          FACILITY:  [facility]   PHYSICIAN:  Thomas A. Cornett, M.D.DATE OF BIRTH:  28-May-1936   DATE OF PROCEDURE:  01/18/2009  DATE OF DISCHARGE:                               OPERATIVE REPORT   PREOPERATIVE DIAGNOSES:  1. Symptomatic cholelithiasis.  2. Umbilical hernia.   POSTOPERATIVE DIAGNOSES:  1. Symptomatic cholelithiasis.  2. Umbilical hernia.   PROCEDURES:  1. Laparoscopic cholecystectomy with intraoperative cholangiogram.  2. Repair of umbilical hernia with primary suture repair.   SURGEON:  Erroll Luna, MD.   ASSISTANT:  Orson Ape. Rise Patience, M.D.   ANESTHESIA:  General endotracheal anesthesia with 0.25% Sensorcaine  local.   ESTIMATED BLOOD LOSS:  20 mL.   SPECIMEN:  1. Omentum.  2. Gallbladder with gallstones.   DRAINS:  None.   INDICATIONS FOR PROCEDURE:  The patient is a 75 year old female who  presents with symptomatic cholelithiasis and an umbilical hernia.  She  presents for repair of the above due to symptoms.  Informed consent was  obtained in the office and this is outlined in my history and physical.   DESCRIPTION OF PROCEDURE:  The patient was brought to the operating  room, placed supine.  After induction of general anesthesia, the abdomen  was prepped and draped in a sterile fashion.  An infraumbilical incision  was made and the hernia was identified.  I opened the hernia sac and  pulled out some omentum and to amputate this in order to reduce it,  which I did between [employer] clamps and 3-0 Vicryl ties.  I reduced the  remainder of the omentum back in the abdominal cavity without  difficulty.  The defect was identified and grabbed by [employer].  It was  about 2.5-3 cm in maximal diameter.  A pursestring suture of 0 Vicryl  was placed around this and a 12-mm Hasson cannula was placed under  direct vision.  Pneumoperitoneum was created to 15 mmHg of CO2 pressure.  The patient was placed in reverse Trendelenburg and rolled to her left.  Laparoscopy was performed.  The small bowel and colon appeared normal.  The liver had some fatty changes to it but no cirrhotic changes.  The  stomach was normal.  An 11-mm subxiphoid port was placed under direct  vision and two 5-mm ports were placed in the right upper quadrant; both  under direct vision.  The gallbladder was identified, grabbed by its  dome, and retracted toward the patient's right shoulder.  A second  grasper was used to grab the infundibulum of the gallbladder and pull it  toward the patient's right lower quadrant.  We peeled away peritoneal  coverings to expose the junction of the cystic duct and the infundibulum  of the gallbladder.  I was able to dissect around this circumferentially  without much difficulty.  Clips were placed on the gallbladder side.  A  small incision was made in the cystic duct for cholangiogram.  Through a  separate stab incision, a Cook cholangiogram catheter was  introduced and  placed into the cystic duct and held in place by a single clip.  After  that, intraoperative cholangiogram was performed using one half-strength  Hypaque dye, which showed a tortuous long cystic duct, free flow of  contrast into a mildly dilated bile duct which flowed into the duodenum  without stricture or stone or extravasation.  There was free flow of  contrast into the common hepatic duct and into the right and left  hepatic ducts without any obvious abnormality.  We removed the catheter,  re-placed 4 clips across the cystic duct stump and divided it.  The  cystic artery was divided between clips in a similar fashion.  There was  1 large posterior branch of the cystic artery I controlled between clips  as well.  The cautery was used to dissect the gallbladder from the  gallbladder fossa.  This was partially intrahepatic.   Once a small hole  was made in the gallbladder, with leakage of bile, but no evidence of  stone loss.  We got the remainder of the gallbladder out of the  gallbladder bed.  There was some oozing from the gallbladder bed,  controlled with electrocautery and Surgicel, with good result.  I  reinspected the gallbladder bed, saw no evidence of any active bleeding  or bile leakage.  The clips were on securely to the cystic duct and  cystic artery respectively.  I then suctioned out any irrigation we used  at this point in time.  The gallbladder was placed in an EndoCatch bag.  I then put the laparoscope through the subxiphoid port and pulled the  gallbladder out through the umbilicus and passed it off the field.  I  then closed the umbilical defect, which was about 3 cm in actual  diameter, with interrupted #1 Novofil and a running 0 Prolene.  We then  reinspected the closure with laparoscopic assistance and saw that there  was no bowel caught up in the closure and the closure appeared more than  adequate.  We reached the gallbladder bed and found it to be hemostatic.  I then removed the ports from the right upper quadrant.  There were no  signs of any port site bleeding.  CO2 was allowed to escape.  The  subxiphoid port was removed and 4-0 Monocryl was used to close all skin  incisions and Dermabond was applied as dressing.  All final counts of  sponge, needles and instruments were found to be correct at this portion  of the case.  The patient was then extubated and  taken to the recovery  room in satisfactory condition.      Thomas A. Cornett, M.D.  Electronically Signed     TAC/MEDQ  D:  01/18/2009  T:  01/18/2009  Job:  [id number]   cc:   Lutricia Feil, MD

## 2010-10-13 NOTE — H&P (Signed)
NAME:  Carrie Monroe, Carrie Monroe                     ACCOUNT NO.:  [id number]   MEDICAL RECORD NO.:  [id number]                   PATIENT TYPE:  INP   LOCATION:  NA                                   FACILITY:  [facility]   PHYSICIAN:  Tarri Glenn, M.D.               DATE OF BIRTH:  1936/01/03   DATE OF ADMISSION:  11/02/2002  DATE OF DISCHARGE:                                HISTORY & PHYSICAL   CHIEF COMPLAINT:  Pain in right knee.   HISTORY OF PRESENT ILLNESS:  Patient is a 75 year old female who was seen by  Dr. Shellia Carwin for evaluation of pain and locking of her right knee.  She has  been previously seen about her right knee on April 06, 1998, by Dr.  Wonda Olds, who felt she had medial compartment arthritis in the right knee.  Since that time, she has noted popping in the knee and frank locking of her  knee when she was walking back in the early part of May.  She also complains  of problems flexing and extending her knee.  X-rays revealed bone-on-bone  contact of the medial compartment of the right knee.  Dr. Shellia Carwin felt it  was best to proceed with a medial compartment unity knee replacement.  Risks  and benefits of the surgery have been discussed with the patient, and  patient wishes to proceed.   PAST MEDICAL HISTORY:  Hypertension.   PAST SURGICAL HISTORY:  1. Skin graft to the right hand.  2. Fibroid tumor removal.   MEDICATIONS:  No current prescription medications.   ALLERGIES:  No known drug allergies.   SOCIAL HISTORY:  Patient denies any tobacco or alcohol use.  She is married.  She lives in a one-story house, and her husband will be her caregiver after  surgery.   FAMILY HISTORY:  Father deceased of coronary artery disease, hypertension,  and diabetes mellitus.  Mother deceased of pancreatic cancer and diabetes  mellitus.   REVIEW OF SYSTEMS:  GENERAL:  Denies fevers, chills, night sweats, bleeding  tendency.  CNS:  Denies blurry or double vision, seizures,  headaches,  paralysis.  RESPIRATORY:  Denies shortness of breath, productive cough,  hemoptysis.  CV:  Denies chest pain, angina, or orthopnea.  GI:  Denies  nausea, vomiting, diarrhea, constipation, melena, or bloody stools.  GU:  Denies dysuria, hematuria, or discharge.  MUSCULOSKELETAL:  Pertinent to  HPI.   PHYSICAL EXAMINATION:  VITAL SIGNS:  Blood pressure 130/70, pulse 60,  respirations 12.  GENERAL:  A well-developed, well-nourished 75 year old female.  HEENT:  Normocephalic, atraumatic.  Pupils are equal, round, and reactive to  light.  NECK:  Supple.  No carotid bruit noted.  CHEST:  Clear to auscultation bilaterally.  No wheezes or crackles.  HEART:  Regular rate and rhythm.  A 2/6 systolic murmur, best heard on the  left sternal border.  No rubs or gallops.  ABDOMEN:  Soft, nontender, and  nondistended.  Positive bowel sounds times  four.  EXTREMITIES:  Lacks 5 to 10 degrees of extension.  Walks with an antalgic  gait, keeping her knee in a flexed position.  She is very tender to  palpation of the medial joint line.  She is neurovascularly intact distally.  SKIN:  No rashes or lesions.   LABORATORY DATA:  X-ray reveals bone-on-bone contact of the medial  compartment of the right knee.   IMPRESSION:  1. Medial compartment osteoarthritis of right knee.  2. Hypertension.   PLAN:  Patient will be admitted to [facility] on November 02, 2002, and  undergo a medial compartment unity on the right by Dr. Tarri Glenn.     Pedro Earls, P.A.-C.                   Tarri Glenn, M.D.    SW/MEDQ  D:  10/27/2002  T:  10/27/2002  Job:  [id number]

## 2010-10-16 ENCOUNTER — Ambulatory Visit: Payer: Medicare Other | Admitting: Psychiatry

## 2010-11-02 ENCOUNTER — Ambulatory Visit
Admission: RE | Admit: 2010-11-02 | Discharge: 2010-11-02 | Disposition: A | Discharge status: Home / Self Care | Payer: Medicare Other | Source: Ambulatory Visit | Attending: Oncology | Admitting: Oncology

## 2010-11-02 DIAGNOSIS — Z9889 Other specified postprocedural states: Secondary | ICD-10-CM

## 2011-02-22 LAB — BASIC METABOLIC PANEL
CO2: 22
Chloride: 105
Creatinine, Ser: 0.79
GFR calc Af Amer: >60
Potassium: 3.9

## 2011-03-16 ENCOUNTER — Encounter (INDEPENDENT_AMBULATORY_CARE_PROVIDER_SITE_OTHER): Payer: Self-pay | Admitting: Surgery

## 2011-03-16 ENCOUNTER — Ambulatory Visit (INDEPENDENT_AMBULATORY_CARE_PROVIDER_SITE_OTHER): Payer: Medicare Other | Admitting: Surgery

## 2011-03-16 VITALS — BP 138/74 | HR 68 | Temp 97.6°F | Resp 20 | Ht 64.0 in | Wt 287.0 lb

## 2011-03-16 DIAGNOSIS — Z853 Personal history of malignant neoplasm of breast: Secondary | ICD-10-CM

## 2011-03-16 DIAGNOSIS — C50412 Malignant neoplasm of upper-outer quadrant of left female breast: Secondary | ICD-10-CM | POA: Insufficient documentation

## 2011-03-16 NOTE — Progress Notes (Signed)
Chief Complaint  Patient presents with  . Breast Cancer Long Term Follow Up    br reck lt br c    HPI Carrie Monroe is a 75 y.o. female.  HPI The patient returns for her one-year followup for her left breast cancer. She was diagnosed in 2009 with a stage I left breast cancer and underwent a left lumpectomy with sentinel lymph node mapping. This was ER and PR positive. She is being maintained on tamoxifen. She has no new complaints. She does have some intermittent discomfort in the left breast at the lumpectomy bed.  Past Medical History  Diagnosis Date  . Cancer     left breast  . Hypertension   . Hyperlipidemia   . Heart murmur   . Diabetes mellitus   . Depression   . Anxiety     Past Surgical History  Procedure Date  . Breast lumpectomy 2009    left  . Cholecystectomy 2010  . Shoulder surgery     right  . Knee surgery     right    Family History  Problem Relation Age of Onset  . Cancer Mother     pancreatic  . Heart disease Father     Social History History  Substance Use Topics  . Smoking status: Never Smoker   . Smokeless tobacco: Not on file  . Alcohol Use: No    No Known Allergies  Current Outpatient Prescriptions  Medication Sig Dispense Refill  . bisoprolol-hydrochlorothiazide (ZIAC) 10-6.25 MG per tablet daily.      . Cholecalciferol (VITAMIN D-3 PO) Take by mouth daily.        Marland Kitchen lisinopril (PRINIVIL,ZESTRIL) 20 MG tablet daily.      . metFORMIN (GLUCOPHAGE) 1000 MG tablet BID times 48H.      Marland Kitchen TAMOXIFEN CITRATE PO Take by mouth daily.        Marland Kitchen VICTOZA 18 MG/3ML SOLN daily.      Marland Kitchen VITAMIN E PO Take by mouth daily.          Review of Systems Review of Systems  Constitutional: Negative for fever, chills and unexpected weight change.  HENT: Negative for hearing loss, congestion, sore throat, trouble swallowing and voice change.   Eyes: Negative for visual disturbance.  Respiratory: Negative for cough and wheezing.   Cardiovascular:  Negative for chest pain, palpitations and leg swelling.  Gastrointestinal: Negative for nausea, vomiting, abdominal pain, diarrhea, constipation, blood in stool, abdominal distention and anal bleeding.  Genitourinary: Negative for hematuria, vaginal bleeding and difficulty urinating.  Musculoskeletal: Negative for arthralgias.  Skin: Negative for rash and wound.  Neurological: Negative for seizures, syncope and headaches.  Hematological: Negative for adenopathy. Does not bruise/bleed easily.  Psychiatric/Behavioral: Negative for confusion.    Blood pressure 138/74, pulse 68, temperature 97.6 F (36.4 C), temperature source Temporal, resp. rate 20, height 5\' 4"  (1.626 m), weight 287 lb (130.182 kg).  Physical Exam Physical Exam  Constitutional: She appears well-developed and well-nourished.  HENT:  Head: Normocephalic and atraumatic.  Nose: Nose normal.  Eyes: EOM are normal. Pupils are equal, round, and reactive to light.  Neck: Normal range of motion. Neck supple. No tracheal deviation present.  Cardiovascular: Normal rate.  Exam reveals no gallop and no friction rub.   No murmur heard. Pulmonary/Chest: Effort normal and breath sounds normal.       Left breast with post surgical changes. Chronic left breast seroma measured 1 cm. Unchanged from last years exam. No left axillary adenopathy.  No other left breast masses. Right breast is without mass. Right axilla normal.  Musculoskeletal: Normal range of motion.    Data Reviewed Mammography from 2012 shows no new lesions.  Assessment    Stage I left breast cancer  Date of surgery July 2009  Left breast lumpectomy with sentinel lymph node mapping  ER positive PR positive HER-2 U. negative  Maintained on tamoxifen    Plan    Followup one year.       Maleek Craver A. 03/16/2011, 10:39 AM

## 2011-03-16 NOTE — Patient Instructions (Signed)
Follow up in 1 year.  Continue mammography.  Breast self exams monthly.  Continue follow up with oncology.

## 2011-04-28 ENCOUNTER — Telehealth: Payer: Self-pay | Admitting: Oncology

## 2011-04-28 NOTE — Telephone Encounter (Signed)
per pof 04/05  called pt and scheduled her appts for april2013

## 2011-05-03 ENCOUNTER — Encounter: Payer: Self-pay | Admitting: Cardiology

## 2011-05-08 ENCOUNTER — Encounter: Payer: Self-pay | Admitting: Cardiology

## 2011-05-08 ENCOUNTER — Ambulatory Visit (INDEPENDENT_AMBULATORY_CARE_PROVIDER_SITE_OTHER): Payer: Medicare Other | Admitting: Cardiology

## 2011-05-08 DIAGNOSIS — E7800 Pure hypercholesterolemia, unspecified: Secondary | ICD-10-CM

## 2011-05-08 DIAGNOSIS — E119 Type 2 diabetes mellitus without complications: Secondary | ICD-10-CM

## 2011-05-08 DIAGNOSIS — I350 Nonrheumatic aortic (valve) stenosis: Secondary | ICD-10-CM | POA: Insufficient documentation

## 2011-05-08 DIAGNOSIS — E785 Hyperlipidemia, unspecified: Secondary | ICD-10-CM | POA: Insufficient documentation

## 2011-05-08 DIAGNOSIS — I119 Hypertensive heart disease without heart failure: Secondary | ICD-10-CM

## 2011-05-08 DIAGNOSIS — I359 Nonrheumatic aortic valve disorder, unspecified: Secondary | ICD-10-CM

## 2011-05-08 DIAGNOSIS — E1169 Type 2 diabetes mellitus with other specified complication: Secondary | ICD-10-CM | POA: Insufficient documentation

## 2011-05-08 DIAGNOSIS — E669 Obesity, unspecified: Secondary | ICD-10-CM | POA: Insufficient documentation

## 2011-05-08 DIAGNOSIS — I5032 Chronic diastolic (congestive) heart failure: Secondary | ICD-10-CM | POA: Insufficient documentation

## 2011-05-08 NOTE — Assessment & Plan Note (Signed)
The patient has a past history of essential hypertension.  She has occasional dizzy spells.  Her main complaint is lack of energy.  This is almost certainly related to her marked exogenous obesity.  She is not motivated to try any further diet plans at this point.

## 2011-05-08 NOTE — Assessment & Plan Note (Signed)
The patient is on Lipitor 40 mg daily.  She is not having any myalgias from the statin therapy.

## 2011-05-08 NOTE — Assessment & Plan Note (Signed)
The patient has not had any of the cardinal symptoms of severe aortic stenosis.  He has occasional dizzy spells but not necessarily related to exertion.  Is not having any angina pectoris or symptoms of congestive heart failure.

## 2011-05-08 NOTE — Progress Notes (Signed)
Carrie Monroe Date of Birth:  1935-07-27 [facility] [id number]   [place], [place]  [address] [phone]           Fax   [phone]  History of Present Illness: This pleasant 75 year old married Caucasian female is seen for a one-year followup office visit.  He has a past history of central hypertension, hypercholesterolemia, exogenous obesity, diabetes, and aortic stenosis.  She is followed medically by Dr. Brigitte Pulse.  Since last visit she has had no new cardiac symptoms.  She does note occasional palpitations.  She states that she has difficulty getting started to do things.  She wonders if she may be having some depression.  She mentioned that Dr. Manuella Ghazi had prescribed an antidepressant but that she had not taken it because she was concerned that it might make her getting even more weight.  She states that she has lost over 100 pounds 3 times in her life but each time she has gained it all back.  The patient has a history of breast cancer.  She had lumpectomy and radiation therapy to the left breast 2 years ago.  Current Outpatient Prescriptions  Medication Sig Dispense Refill  . atorvastatin (LIPITOR) 80 MG tablet Take 40 mg by mouth daily.       . bisoprolol-hydrochlorothiazide (ZIAC) 10-6.25 MG per tablet daily.      . Cholecalciferol (VITAMIN D-3 PO) Take by mouth daily.        Marland Kitchen lisinopril (PRINIVIL,ZESTRIL) 20 MG tablet daily.      . metFORMIN (GLUCOPHAGE) 1000 MG tablet BID times 48H.      Marland Kitchen NOVOFINE 32G X 6 MM MISC daily.       Marland Kitchen TAMOXIFEN CITRATE PO Take by mouth daily.        Marland Kitchen VICTOZA 18 MG/3ML SOLN daily.      Marland Kitchen VITAMIN E PO Take by mouth daily.          No Known Allergies  Patient Active Problem List  Diagnoses  . History of breast cancer  . Pure hypercholesterolemia  . Diabetes mellitus without mention of complication  . Benign hypertensive heart disease without heart failure  . Aortic stenosis, moderate    History    Smoking status  . Never Smoker   Smokeless tobacco  . Not on file    History  Alcohol Use No    Family History  Problem Relation Age of Onset  . Cancer Mother     pancreatic  . Heart disease Father     Review of Systems: Constitutional: no fever chills diaphoresis or fatigue or change in weight.  Head and neck: no hearing loss, no epistaxis, no photophobia or visual disturbance. Respiratory: No cough, shortness of breath or wheezing. Cardiovascular: No chest pain peripheral edema, palpitations. Gastrointestinal: No abdominal distention, no abdominal pain, no change in bowel habits hematochezia or melena. Genitourinary: No dysuria, no frequency, no urgency, no nocturia. Musculoskeletal:No arthralgias, no back pain, no gait disturbance or myalgias. Neurological: No dizziness, no headaches, no numbness, no seizures, no syncope, no weakness, no tremors. Hematologic: No lymphadenopathy, no easy bruising. Psychiatric: No confusion, no hallucinations, no sleep disturbance.    Physical Exam: Filed Vitals:   05/08/11 1354  BP: 130/70  Pulse: 63   General appearance reveals a large middle-aged woman in no distress.Pupils equal and reactive.   Extraocular Movements are full.  There is no scleral icterus.  The mouth and pharynx are normal.  The neck  is supple.  The carotids reveal no bruits.  The jugular venous pressure is normal.  The thyroid is not enlarged.  There is no lymphadenopathy.  The chest is clear to percussion and auscultation. There are no rales or rhonchi. Expansion of the chest is symmetrical.  Heart reveals a grade 2/6 harsh systolic ejection murmur at the base which radiates toward the neck bilaterally.  No diastolic murmur.  No gallop or rub.The abdomen is soft and nontender. Bowel sounds are normal. The liver and spleen are not enlarged. There Are no abdominal masses. There are no bruits.  Extremities show good pedal pulses and no phlebitis or edema.Strength is  normal and symmetrical in all extremities.  There is no lateralizing weakness.  There are no sensory deficits.  The skin is warm and dry.  There is no rash. EKG today shows normal sinus rhythm and is within normal limits.  Assessment / Plan:  The patient will return for a two-dimensional echocardiogram later this month to evaluate her aortic stenosis.  Recheck in one year for followup office visit and EKG.  No change in medications.  I did encourage her to try to lose weight

## 2011-05-08 NOTE — Patient Instructions (Addendum)
Your physician has requested that you have an echocardiogram. Echocardiography is a painless test that uses sound waves to create images of your heart. It provides your doctor with information about the size and shape of your heart and how well your heart's chambers and valves are working. This procedure takes approximately one hour. There are no restrictions for this procedure. AFTER 05/16/11  Your physician recommends that you continue on your current medications as directed. Please refer to the Current Medication list given to you today. Your physician wants you to follow-up in:1 year You will receive a reminder letter in the mail two months in advance. If you don't receive a letter, please call our office to schedule the follow-up appointment.

## 2011-05-27 ENCOUNTER — Other Ambulatory Visit: Payer: Self-pay | Admitting: Cardiology

## 2011-06-05 ENCOUNTER — Ambulatory Visit (HOSPITAL_COMMUNITY): Payer: Medicare Other | Attending: Cardiology | Admitting: Radiology

## 2011-06-05 DIAGNOSIS — E785 Hyperlipidemia, unspecified: Secondary | ICD-10-CM | POA: Insufficient documentation

## 2011-06-05 DIAGNOSIS — E119 Type 2 diabetes mellitus without complications: Secondary | ICD-10-CM | POA: Insufficient documentation

## 2011-06-05 DIAGNOSIS — I10 Essential (primary) hypertension: Secondary | ICD-10-CM | POA: Insufficient documentation

## 2011-06-05 DIAGNOSIS — I350 Nonrheumatic aortic (valve) stenosis: Secondary | ICD-10-CM

## 2011-06-05 DIAGNOSIS — R002 Palpitations: Secondary | ICD-10-CM | POA: Insufficient documentation

## 2011-06-05 DIAGNOSIS — I359 Nonrheumatic aortic valve disorder, unspecified: Secondary | ICD-10-CM | POA: Insufficient documentation

## 2011-06-05 DIAGNOSIS — R42 Dizziness and giddiness: Secondary | ICD-10-CM | POA: Insufficient documentation

## 2011-06-08 ENCOUNTER — Telehealth: Payer: Self-pay | Admitting: *Deleted

## 2011-06-08 NOTE — Telephone Encounter (Signed)
Advised of echo results

## 2011-06-08 NOTE — Telephone Encounter (Signed)
Message copied by Earvin Hansen on Fri Jun 08, 2011  6:10 PM ------      Message from: Darlin Coco      Created: Tue Jun 05, 2011  7:56 PM       Pl report.  The echo is slightly better than last time.  The aortic gradient has improved slightly.Continue same meds.

## 2011-07-23 ENCOUNTER — Other Ambulatory Visit: Payer: Self-pay | Admitting: *Deleted

## 2011-07-23 DIAGNOSIS — C50419 Malignant neoplasm of upper-outer quadrant of unspecified female breast: Secondary | ICD-10-CM

## 2011-07-23 MED ORDER — TAMOXIFEN CITRATE 20 MG PO TABS: 20.0000 mg | ORAL_TABLET | Freq: Every day | ORAL | Status: AC

## 2011-08-30 ENCOUNTER — Other Ambulatory Visit (HOSPITAL_BASED_OUTPATIENT_CLINIC_OR_DEPARTMENT_OTHER): Payer: Medicare Other | Admitting: Lab

## 2011-08-30 ENCOUNTER — Other Ambulatory Visit: Payer: Self-pay | Admitting: Oncology

## 2011-08-30 ENCOUNTER — Ambulatory Visit (HOSPITAL_BASED_OUTPATIENT_CLINIC_OR_DEPARTMENT_OTHER): Payer: Medicare Other | Admitting: Physician Assistant

## 2011-08-30 VITALS — BP 141/79 | HR 73 | Temp 97.9°F | Ht 64.0 in | Wt 270.0 lb

## 2011-08-30 DIAGNOSIS — E119 Type 2 diabetes mellitus without complications: Secondary | ICD-10-CM

## 2011-08-30 DIAGNOSIS — Z170 Estrogen receptor positive status [ER+]: Secondary | ICD-10-CM

## 2011-08-30 DIAGNOSIS — I10 Essential (primary) hypertension: Secondary | ICD-10-CM

## 2011-08-30 DIAGNOSIS — C50419 Malignant neoplasm of upper-outer quadrant of unspecified female breast: Secondary | ICD-10-CM

## 2011-08-30 DIAGNOSIS — C50912 Malignant neoplasm of unspecified site of left female breast: Secondary | ICD-10-CM

## 2011-08-30 LAB — CBC WITH DIFFERENTIAL/PLATELET
BASO%: 1.2 % (ref 0.0–2.0)
EOS%: 4.8 % (ref 0.0–7.0)
LYMPH%: 31.8 % (ref 14.0–49.7)
MCHC: 33.7 g/dL (ref 31.5–36.0)
MONO#: 0.4 10*3/uL (ref 0.1–0.9)
MONO%: 9.2 % (ref 0.0–14.0)
Platelets: 113 10*3/uL — ABNORMAL LOW (ref 145–400)
RBC: 3.57 10*6/uL — ABNORMAL LOW (ref 3.70–5.45)
WBC: 4.6 10*3/uL (ref 3.9–10.3)
nRBC: 0 % (ref 0–0)

## 2011-08-30 NOTE — Progress Notes (Signed)
Hematology and Oncology Follow Up Visit  ALICEN DAI [id number] 14-Sep-1935 76 y.o. 08/30/2011    HPI: Mrs. Silcott is a 76 year old [place], [place] woman with a history of a T1c, N0, ER/PR positive, HER-2 negative left breast carcinoma for which she underwent a left lumpectomy with sentinel node dissection July 2009 followed by radiation therapy, completed December of 2009, initiation of Aromasin then switched to tamoxifen, of which she continues to take. 2. Diabetes mellitus, 3. Hypertension  Interim History:   Schylar is seen today for followup pertaining to her history of a stage I ER/PR positive left breast carcinoma. She continues on tamoxifen 20 mg by mouth daily, and really has no complaints. She denies any unexplained fevers, chills, unusual night sweats, shortness of breath, or chest pain. No unusual nausea, emesis, diarrhea or constipation issues. Her appetite is "fine". She is managed by Dr. Marton Redwood in regards to her primary care needs, she continues to be followed closely for history of diabetes and hypertension. She'll be due for her annual mammogram in June of 2013. A detailed review of systems is otherwise noncontributory as noted below.  Review of Systems: Constitutional:  no weight loss, fever, night sweats and feels well Eyes: No complaints ENT: No complaints Cardiovascular: no chest pain or dyspnea on exertion Respiratory: no cough, shortness of breath, or wheezing Neurological: no TIA or stroke symptoms Dermatological: negative Gastrointestinal: no abdominal pain, change in bowel habits, or black or bloody stools Genito-Urinary: no dysuria, trouble voiding, or hematuria Hematological and Lymphatic: negative Breast: negative Musculoskeletal: negative Remaining ROS negative.   Medications:   I have reviewed the patient's current medications.  Current Outpatient Prescriptions  Medication Sig Dispense Refill  . atorvastatin (LIPITOR) 80  MG tablet Take 40 mg by mouth daily.       . bisoprolol-hydrochlorothiazide (ZIAC) 10-6.25 MG per tablet TAKE 1 TABLET DAILY  90 tablet  2  . Cholecalciferol (VITAMIN D-3 PO) Take by mouth daily.        Marland Kitchen lisinopril (PRINIVIL,ZESTRIL) 20 MG tablet daily.      . metFORMIN (GLUCOPHAGE) 1000 MG tablet BID times 48H.      Marland Kitchen NOVOFINE 32G X 6 MM MISC daily.       . tamoxifen (NOLVADEX) 20 MG tablet Take 20 mg by mouth daily.      Marland Kitchen VITAMIN E PO Take by mouth daily.        Marland Kitchen VICTOZA 18 MG/3ML SOLN daily.        Allergies: No Known Allergies  Physical Exam: Filed Vitals:   08/30/11 1314  BP: 141/79  Pulse: 73  Temp: 97.9 F (36.6 C)    Body mass index is 46.35 kg/(m^2). Weight: 270 lbs. HEENT:  Sclerae anicteric, conjunctivae pink.  Oropharynx clear.  No mucositis or candidiasis.   Nodes:  No cervical, supraclavicular, or axillary lymphadenopathy palpated.  Breast Exam:  The right breast lumpectomy scar is essentially benign, chronic telangiectasias are present. There is evidence of a seroma deep which is chronic in nature. No nipple inversion or discharge, skin is fairly clear. No palpable lymphadenopathy. The right breast is free of masses, skin changes nipple inversion or discharge, and the axilla is also clear. Lungs:  Clear to auscultation bilaterally.  No crackles, rhonchi, or wheezes.   Heart:  Regular rate and rhythm.   Abdomen:  Soft, nontender.  Positive bowel sounds.  No organomegaly or masses palpated.   Musculoskeletal:  No focal spinal tenderness to palpation.  Extremities:  Benign.  No peripheral edema or cyanosis.   Skin:  Benign.   Neuro:  Nonfocal, alert and oriented x 3.   Lab Results: Lab Results  Component Value Date   WBC 4.6 08/30/2011   HGB 11.5* 08/30/2011   HCT 34.2* 08/30/2011   MCV 95.8 08/30/2011   PLT 113* 08/30/2011   NEUTROABS 2.4 08/30/2011     Chemistry      Component Value Date/Time   NA 138 08/24/2010 1341   NA 138 08/24/2010 1341   NA 138 08/24/2010 1341    K 4.3 08/24/2010 1341   K 4.3 08/24/2010 1341   K 4.3 08/24/2010 1341   CL 106 08/24/2010 1341   CL 106 08/24/2010 1341   CL 106 08/24/2010 1341   CO2 19 08/24/2010 1341   CO2 19 08/24/2010 1341   CO2 19 08/24/2010 1341   BUN 16 08/24/2010 1341   BUN 16 08/24/2010 1341   BUN 16 08/24/2010 1341   CREATININE 0.80 08/24/2010 1341   CREATININE 0.80 08/24/2010 1341   CREATININE 0.80 08/24/2010 1341      Component Value Date/Time   CALCIUM 9.5 08/24/2010 1341   CALCIUM 9.5 08/24/2010 1341   CALCIUM 9.5 08/24/2010 1341   ALKPHOS 74 08/24/2010 1341   ALKPHOS 74 08/24/2010 1341   ALKPHOS 74 08/24/2010 1341   AST 37 08/24/2010 1341   AST 37 08/24/2010 1341   AST 37 08/24/2010 1341   ALT 33 08/24/2010 1341   ALT 33 08/24/2010 1341   ALT 33 08/24/2010 1341   BILITOT 0.4 08/24/2010 1341   BILITOT 0.4 08/24/2010 1341   BILITOT 0.4 08/24/2010 1341      Lab Results  Component Value Date   LABCA2 15 08/24/2010   LABCA2 15 08/24/2010    Radiological Studies: 11/02/10 DIGITAL DIAGNOSTIC BILATERAL MAMMOGRAM WITH CAD  Comparison: Previous examinations.  Findings: Stable predominately fatty tissue in both breasts.  Stable post lumpectomy and postradiation changes on the left. No new findings suspicious for malignancy in either breast.  Mammographic images were processed with CAD.  IMPRESSION:  No evidence of malignancy. A follow up bilateral diagnostic  mammogram is recommended in one year. Also recommended is consideration of biennial screening MR of the breasts.  BI-RADS CATEGORY 2: Benign finding(s).  Original Report Authenticated By: Gerald Stabs, M.D.   Assessment:  Mrs. Goulette is a 76 year old [place], [place] woman with a history of a T1c, N0, ER/PR positive, HER-2 negative left breast carcinoma for which she underwent a left lumpectomy with sentinel node dissection July 2009 followed by radiation therapy, completed December of 2009, initiation of Aromasin then switched to tamoxifen, of  which she continues to take.  No evidence of recurrent or metastatic disease. 2. Diabetes mellitus, 3. Hypertension  Case has been reviewed with Dr. Eston Esters.  Plan:  Patient will continue on tamoxifen 20 mg by mouth daily. We will schedule her [facility]. We will plan on seeing her back in April of 2014, though sooner if the need should arise.  This plan was reviewed with the patient, who voices understanding and agreement.  She knows to call with any changes or problems.    Dre Gamino T, PA-C 08/30/2011

## 2011-08-31 ENCOUNTER — Telehealth: Payer: Self-pay | Admitting: *Deleted

## 2011-08-31 LAB — COMPREHENSIVE METABOLIC PANEL
AST: 30 U/L (ref 0–37)
Albumin: 3.6 g/dL (ref 3.5–5.2)
Alkaline Phosphatase: 71 U/L (ref 39–117)
BUN: 22 mg/dL (ref 6–23)
Creatinine, Ser: 0.85 mg/dL (ref 0.50–1.10)
Glucose, Bld: 109 mg/dL — ABNORMAL HIGH (ref 70–99)
Potassium: 4.4 mEq/L (ref 3.5–5.3)
Total Bilirubin: 0.5 mg/dL (ref 0.3–1.2)

## 2011-08-31 LAB — VITAMIN D 25 HYDROXY (VIT D DEFICIENCY, FRACTURES): Vit D, 25-Hydroxy: 48 ng/mL (ref 30–89)

## 2011-08-31 NOTE — Telephone Encounter (Signed)
[facility] NEEDS TO SAY DIAGNOSTIC MAMMOGRAM SENT CHRISTINE A MESSAGE INFORMING HER OF THE CHANGE

## 2011-10-02 ENCOUNTER — Other Ambulatory Visit: Payer: Self-pay | Admitting: Oncology

## 2011-10-02 DIAGNOSIS — Z853 Personal history of malignant neoplasm of breast: Secondary | ICD-10-CM

## 2011-11-06 ENCOUNTER — Ambulatory Visit
Admission: RE | Admit: 2011-11-06 | Discharge: 2011-11-06 | Disposition: A | Discharge status: Home / Self Care | Payer: Medicare Other | Source: Ambulatory Visit | Attending: Oncology | Admitting: Oncology

## 2011-11-06 ENCOUNTER — Other Ambulatory Visit: Payer: Self-pay | Admitting: Oncology

## 2011-11-06 DIAGNOSIS — Z853 Personal history of malignant neoplasm of breast: Secondary | ICD-10-CM

## 2012-04-07 ENCOUNTER — Other Ambulatory Visit: Payer: Self-pay | Admitting: Oncology

## 2012-04-07 DIAGNOSIS — R922 Inconclusive mammogram: Secondary | ICD-10-CM

## 2012-05-01 ENCOUNTER — Ambulatory Visit
Admission: RE | Admit: 2012-05-01 | Discharge: 2012-05-01 | Disposition: A | Discharge status: Home / Self Care | Payer: Medicare Other | Source: Ambulatory Visit | Attending: Oncology | Admitting: Oncology

## 2012-05-01 DIAGNOSIS — R922 Inconclusive mammogram: Secondary | ICD-10-CM

## 2012-07-05 ENCOUNTER — Encounter: Payer: Self-pay | Admitting: *Deleted

## 2012-08-14 ENCOUNTER — Other Ambulatory Visit: Payer: Self-pay | Admitting: *Deleted

## 2012-08-14 DIAGNOSIS — C50919 Malignant neoplasm of unspecified site of unspecified female breast: Secondary | ICD-10-CM

## 2012-08-14 MED ORDER — TAMOXIFEN CITRATE 20 MG PO TABS: 20.0000 mg | ORAL_TABLET | Freq: Every day | ORAL | Status: DC

## 2012-08-26 ENCOUNTER — Other Ambulatory Visit: Payer: Medicare Other | Admitting: Lab

## 2012-08-26 ENCOUNTER — Ambulatory Visit: Payer: Medicare Other | Admitting: Oncology

## 2012-08-28 ENCOUNTER — Encounter: Payer: Self-pay | Admitting: Gynecologic Oncology

## 2012-08-28 ENCOUNTER — Ambulatory Visit (HOSPITAL_BASED_OUTPATIENT_CLINIC_OR_DEPARTMENT_OTHER): Payer: Medicare Other | Admitting: Lab

## 2012-08-28 ENCOUNTER — Telehealth: Payer: Self-pay | Admitting: *Deleted

## 2012-08-28 ENCOUNTER — Telehealth: Payer: Self-pay | Admitting: Oncology

## 2012-08-28 ENCOUNTER — Ambulatory Visit (HOSPITAL_COMMUNITY)
Admission: RE | Admit: 2012-08-28 | Discharge: 2012-08-28 | Disposition: A | Discharge status: Home / Self Care | Payer: Medicare Other | Source: Ambulatory Visit | Attending: Oncology | Admitting: Oncology

## 2012-08-28 ENCOUNTER — Ambulatory Visit (HOSPITAL_BASED_OUTPATIENT_CLINIC_OR_DEPARTMENT_OTHER): Payer: Medicare Other | Admitting: Gynecologic Oncology

## 2012-08-28 VITALS — BP 135/65 | HR 79 | Temp 98.5°F | Resp 20 | Ht 64.0 in | Wt 284.6 lb

## 2012-08-28 DIAGNOSIS — M7989 Other specified soft tissue disorders: Secondary | ICD-10-CM

## 2012-08-28 DIAGNOSIS — C50919 Malignant neoplasm of unspecified site of unspecified female breast: Secondary | ICD-10-CM | POA: Insufficient documentation

## 2012-08-28 DIAGNOSIS — C50912 Malignant neoplasm of unspecified site of left female breast: Secondary | ICD-10-CM

## 2012-08-28 DIAGNOSIS — I350 Nonrheumatic aortic (valve) stenosis: Secondary | ICD-10-CM

## 2012-08-28 DIAGNOSIS — R609 Edema, unspecified: Secondary | ICD-10-CM | POA: Insufficient documentation

## 2012-08-28 LAB — CBC WITH DIFFERENTIAL/PLATELET
BASO%: 1.1 % (ref 0.0–2.0)
Basophils Absolute: 0.1 10*3/uL (ref 0.0–0.1)
EOS%: 4.3 % (ref 0.0–7.0)
HGB: 11.8 g/dL (ref 11.6–15.9)
MCH: 31.2 pg (ref 25.1–34.0)
MONO#: 0.5 10*3/uL (ref 0.1–0.9)
RDW: 13.7 % (ref 11.2–14.5)
WBC: 5.9 10*3/uL (ref 3.9–10.3)
lymph#: 1.6 10*3/uL (ref 0.9–3.3)

## 2012-08-28 LAB — COMPREHENSIVE METABOLIC PANEL (CC13)
ALT: 25 U/L (ref 0–55)
AST: 28 U/L (ref 5–34)
Albumin: 3.3 g/dL — ABNORMAL LOW (ref 3.5–5.0)
Calcium: 9.3 mg/dL (ref 8.4–10.4)
Chloride: 111 mEq/L — ABNORMAL HIGH (ref 98–107)
Potassium: 4.3 mEq/L (ref 3.5–5.1)

## 2012-08-28 NOTE — Patient Instructions (Addendum)
Hold on taking your Tamoxifen until we call you with the results of your doppler.  We will call you with the results of your lab work also.  Plan to follow up with Dr. Brigitte Pulse as soon as possible for evaluation of recent weight gain and BLE edema and Dr. Humphrey Rolls in October 2014 with lab work or sooner if needed.  Please call for any questions or concerns.

## 2012-08-28 NOTE — Progress Notes (Signed)
VASCULAR LAB PRELIMINARY  PRELIMINARY  PRELIMINARY  PRELIMINARY  Bilateral lower extremity venous duplex completed.    Preliminary report:  Bilateral:  No evidence of DVT, superficial thrombosis, or Baker's Cyst. Mild to moderate interstitial fluid noted bilaterally throughout the thigh   Carrie Monroe, RVS 08/28/2012, 2:36 PM

## 2012-08-28 NOTE — Progress Notes (Signed)
OFFICE PROGRESS NOTE  CC  Carrie Rouse, MD [place], [place]. [place] [address]  DIAGNOSIS:  Carrie Monroe is a 77 year old Carrie Monroe, who underwent a mammogram on 10/21/2007, which showed a density in the upper outer left breast.  She returned for a spot compression view and ultrasound on 10/24/2007, which showed a 0.6 cm hypoechoic mass 0.8 x 0.8 at 2 o'clock position 6 cm from the nipple.  A biopsy was recommended.  Biopsy on 10/28/2007 showed invasive mammary carcinoma with some tubular features.  Definite LVI was not seen.  This appeared to be low-grade, ER and PR positive 98% and 10% respectively, 5% proliferative index, HER-2 was 2+, and FISH ratio was 1.42.  The patient subsequently had a preoperative MRI scan, which demonstrated a solitary 1 x 0.8 x 1.1 cm enhancement 3 o'clock position left breast.  No other abnormalities were seen.    PRIOR THERAPY:  She underwent a left lumpectomy and sentinel lymph node evaluation on 11/26/2007.  Final pathology showed a 1.2 cm grade 1 of 3 invasive ductal cancer with no evidence of lymphovascular invasion.  Three sentinel lymph nodes were identified all of which were negative for malignancy.  Surgical margins were ultimately rendered clear of cancer.  She underwent radiation therapy under the care of Dr. Valere Dross from 01/22/08 to 02/19/08.  She started Femara in October 2009 and was switched to Aromasin shortly after initiation due to severe hot flashes.  Due to side effects, she was switched to Tamoxifen in December 2009 and has tolerated it well since.    CURRENT THERAPY:  Tamoxifen 20 mg daily.  INTERVAL HISTORY: Carrie Monroe 77 y.o. female returns for continued follow up.  She is tolerating Tamoxifen well, with hot flashes tapering off after several months of use.  She denies vaginal bleeding or discharge.  She reports the onset of bilateral lower extremity edema one week ago.  She reports  pain behind her left knee that "has been there for a long time."  She reports difficulty ambulating with the edema in her legs.  She reports gaining 35 lbs over the past several months and she is unable to attribute her weight gain with any recent changes or events.  She reports difficulty performing activities of daily living due to shortness of breath.  States that she is unable to walk long distances.  She is currently sleeping in a recliner every night.  She reports having a non-productive cough for the past two to three weeks with no fever or chills.  She reports checking her blood sugar last one week ago with the fasting total of 156.  Her last echocardiogram was in January 2013 and she reports having a history of hypertension, dyslipidemia, diabetes, and a heart murmur.  She states that she has not notified her primary care provider of her new onset of lower extremity edema and her last appointment was cancelled due to weather.  She states that she is worried about her heart and denies having a history of congestive heart failure.       MEDICAL HISTORY: Past Medical History  Diagnosis Date  . Cancer     left breast  . Hypertension   . Hyperlipidemia   . Heart murmur   . Diabetes mellitus   . Depression   . Anxiety   . Exogenous obesity   . Aortic stenosis   . Breast cancer   . Bilateral lower extremity edema  ALLERGIES:  has No Known Allergies.  MEDICATIONS:  Current Outpatient Prescriptions  Medication Sig Dispense Refill  . atorvastatin (LIPITOR) 80 MG tablet Take 40 mg by mouth daily.       . bisoprolol-hydrochlorothiazide (ZIAC) 10-6.25 MG per tablet TAKE 1 TABLET DAILY  90 tablet  2  . Calcium Carbonate-Vitamin D (CALCIUM + D PO) Take 1 capsule by mouth daily.      . Cholecalciferol (VITAMIN D-3 PO) Take by mouth daily.        Marland Kitchen lisinopril (PRINIVIL,ZESTRIL) 20 MG tablet daily.      . metFORMIN (GLUCOPHAGE) 1000 MG tablet BID times 48H.      Marland Kitchen NOVOFINE 32G X 6 MM MISC  daily.       . tamoxifen (NOLVADEX) 20 MG tablet Take 1 tablet (20 mg total) by mouth daily.  90 tablet  0  . VITAMIN E PO Take by mouth daily.         No current facility-administered medications for this visit.    SURGICAL HISTORY:  Past Surgical History  Procedure Laterality Date  . Breast lumpectomy  2009    left  . Cholecystectomy  2010  . Shoulder surgery      right  . Knee surgery      right  . US echocardiography  05/15/2010    EF 60-65%    REVIEW OF SYSTEMS:   Constitutional: Feels well.  No fever or chills. Cardiovascular: Shortness of breath on exertion.  Bilateral lower extremity edema x 1 week.  No chest pain. Pulmonary: Non-productive cough for two to three weeks.  No wheeze.  Gastrointestinal: No nausea, vomiting, or diarrhea. No bright red blood per rectum or change in bowel movement.  Genitourinary: No frequency, urgency, or dysuria. No vaginal bleeding or discharge.  Musculoskeletal: No myalgia or joint pain.  Limited with ambulating due to edema and shortness of breath. Neurologic: Reports "my left leg almost gave out on me when I was walking because of the swelling."  No weakness, numbness, or change in gait.  Psychology: Reports episodes of insomnia that resolve after sleeping in her recliner.  No depression or anxiety.  HEALTH MAINTENANCE: Mammogram: 11/06/2011 Colonoscopy: 2 to 3 years ago Bone Density:  Due to have, refuses to have since she does not want to take medication for bone loss. Pap Smear:  02/2012 Eye Exam:  Yearly Vitamin D: 08/2011 Lipid Panel: Managed by Dr. Brigitte Pulse  PHYSICAL EXAMINATION: Blood pressure 135/65, pulse 79, temperature 98.5 F (36.9 C), temperature source Oral, resp. rate 20, height 5\' 4"  (1.626 m), weight 284 lb 9.6 oz (129.094 kg). Body mass index is 48.83 kg/(m^2). ECOG PERFORMANCE STATUS: 1 - Symptomatic but completely ambulatory  General: Well developed, well nourished female in no acute distress. Alert and oriented x  3.  Head/ Neck: Oropharynx clear.  Sclerae anicteric.  Supple without any enlargements.  Lymph node survey: No cervical, supraclavicular, or axillary adenopathy  Cardiovascular: Regular rate and rhythm. S1 and S2 normal.  Systolic murmur noted.  Lungs: Clear to auscultation bilaterally. No wheezes/crackles/rhonchi noted.  Skin: No rashes or lesions present. Back: No CVA tenderness.  Abdomen: Abdomen soft, non-tender and obese. Active bowel sounds in all quadrants. No evidence of a fluid wave or abdominal masses.  Breasts: Inspection negative with no nodularity, masses, erythema, or discharge noted bilaterally.  Left breast lumpectomy scar benign with chronic telangiectasias present.  Evidence of a deep seroma present, which was noted as chronic in nature at her last visit.  No lymphadenopathy. Extremities: 2+ pitting edema noted in bilateral lower extremities.  Edema noted to the back of the left knee with no tenderness, erythema, or increased warmth observed.  Limited range of motion of the lower extremities due to edema.  Negative Homan's sign bilaterally.  No bilateral cyanosis or clubbing.    LABORATORY DATA: Lab Results  Component Value Date   WBC 5.9 08/28/2012   HGB 11.8 08/28/2012   HCT 35.5 08/28/2012   MCV 94.0 08/28/2012   PLT 127* 08/28/2012      Chemistry      Component Value Date/Time   NA 141 08/28/2012 1224   NA 140 08/30/2011 1250   K 4.3 08/28/2012 1224   K 4.4 08/30/2011 1250   CL 111* 08/28/2012 1224   CL 108 08/30/2011 1250   CO2 20* 08/28/2012 1224   CO2 20 08/30/2011 1250   BUN 25.3 08/28/2012 1224   BUN 22 08/30/2011 1250   CREATININE 0.9 08/28/2012 1224   CREATININE 0.85 08/30/2011 1250      Component Value Date/Time   CALCIUM 9.3 08/28/2012 1224   CALCIUM 9.4 08/30/2011 1250   ALKPHOS 71 08/28/2012 1224   ALKPHOS 71 08/30/2011 1250   AST 28 08/28/2012 1224   AST 30 08/30/2011 1250   ALT 25 08/28/2012 1224   ALT 28 08/30/2011 1250   BILITOT 0.45 08/28/2012 1224   BILITOT 0.5 08/30/2011 1250        RADIOGRAPHIC STUDIES:  No results found.  ASSESSMENT: 77 year old Carrie Garden Monroe: #1  S/P left lumpectomy with sentinel lymph node biopsy on 11/26/07 for a T1c N0 IDC of the left breast, Stage I, grade 1, ER 98%, PR 10%, Ki67 5%, HER2 2+.  #2  She underwent radiation therapy under the care of Dr. Valere Dross from 01/22/08 to 02/19/08.  #3  She started Femara in October of 2009, was switched to Aromasin for a short time, then to Tamoxifen in December of 2009.  #4  New onset of bilateral lower extremity edema for one week.     PLAN:  She is to have bilateral venous dopplers as soon as possible to rule out DVT since she is on Tamoxifen.  She is advised to hold on taking Tamoxifen starting today until she is contacted with her doppler results.  She is to follow up with Dr. Brigitte Pulse as soon as possible for evaluation of recent weight gain, BLE edema, and cough.  She is to see Dr. Humphrey Rolls in October 2014 with lab work or sooner if needed.     All questions were answered. The patient knows to call the clinic with any problems, questions or concerns. We can certainly see the patient much sooner if necessary.  I spent 30 minutes counseling the patient face to face and Dr. Humphrey Rolls spoke with the patient about future plans and recommendations along with assessment of BLE edema. The total time spent in the appointment was 60 minutes.   Joylene John, NP Medical/Oncology [facility] [phone] (Office)  08/28/2012, 2:02 PM

## 2012-08-28 NOTE — Telephone Encounter (Signed)
REPORT IS NEGATIVE FOR DVT. THERE IS MILD TO MODERATE INTERSTITIAL FLUIDS THROUGH BOTH CALVES. THIS INFORMATION WAS GIVEN TO MELISSA CROSS,NP. VERBAL ORDER AND READ BACK TO MELISSA CROSS,NP- GIVE BILATERAL VENOUS STUDY RESULTS TO PT. INSTRUCT HER TO MAKE AN APPOINTMENT WITH HER PRIMARY CARE PHYSICIAN AS SOON AS POSSIBLE. NOTIFIED PT. SHE VOICES UNDERSTANDING.

## 2012-08-29 ENCOUNTER — Telehealth: Payer: Self-pay | Admitting: Gynecologic Oncology

## 2012-08-29 NOTE — Telephone Encounter (Signed)
Patient informed of lab results.  She reports that she has not scheduled an appt with her primary care provider but she is going to call today.  She states that her BLE edema has decreased some since yesterday.  Instructed to call for any questions or concerns.  No issues voiced.

## 2012-10-06 ENCOUNTER — Other Ambulatory Visit: Payer: Self-pay | Admitting: Internal Medicine

## 2012-10-06 DIAGNOSIS — Z853 Personal history of malignant neoplasm of breast: Secondary | ICD-10-CM

## 2012-10-09 ENCOUNTER — Encounter: Payer: Self-pay | Admitting: Cardiology

## 2012-10-09 ENCOUNTER — Ambulatory Visit (INDEPENDENT_AMBULATORY_CARE_PROVIDER_SITE_OTHER): Payer: Medicare Other | Admitting: Cardiology

## 2012-10-09 VITALS — BP 143/44 | HR 68 | Ht 64.0 in | Wt 274.0 lb

## 2012-10-09 DIAGNOSIS — R609 Edema, unspecified: Secondary | ICD-10-CM

## 2012-10-09 DIAGNOSIS — I359 Nonrheumatic aortic valve disorder, unspecified: Secondary | ICD-10-CM

## 2012-10-09 DIAGNOSIS — R0600 Dyspnea, unspecified: Secondary | ICD-10-CM | POA: Insufficient documentation

## 2012-10-09 DIAGNOSIS — R600 Localized edema: Secondary | ICD-10-CM

## 2012-10-09 DIAGNOSIS — IMO0001 Reserved for inherently not codable concepts without codable children: Secondary | ICD-10-CM

## 2012-10-09 DIAGNOSIS — I350 Nonrheumatic aortic (valve) stenosis: Secondary | ICD-10-CM

## 2012-10-09 DIAGNOSIS — R0609 Other forms of dyspnea: Secondary | ICD-10-CM

## 2012-10-09 NOTE — Assessment & Plan Note (Signed)
The patient has had recently increased exertional dyspnea.  She saw Dr. Brigitte Pulse 2 days ago and a chest x-ray was obtained and the patient was told that there was fluid around her heart.  She was placed on Lasix yesterday and diuresed 5 pounds overnight and her previous pedal edema has improved already.

## 2012-10-09 NOTE — Patient Instructions (Signed)
Your physician has requested that you have an echocardiogram. Echocardiography is a painless test that uses sound waves to create images of your heart. It provides your doctor with information about the size and shape of your heart and how well your heart's chambers and valves are working. This procedure takes approximately one hour. There are no restrictions for this procedure.  Your physician recommends that you continue on your current medications as directed. Please refer to the Current Medication list given to you today.  Your physician wants you to follow-up in: 6 months You will receive a reminder letter in the mail two months in advance. If you don't receive a letter, please call our office to schedule the follow-up appointment.

## 2012-10-09 NOTE — Assessment & Plan Note (Addendum)
The patient has a history of aortic stenosis.  Her last echocardiogram on 06/05/2011 showed normal left ventricular systolic function with an ejection fraction of 60% and she had moderate aortic stenosis with peak gradient of 36 and a mean gradient 21.

## 2012-10-09 NOTE — Assessment & Plan Note (Signed)
The patient has not been having any hypoglycemic episodes 

## 2012-10-09 NOTE — Progress Notes (Signed)
Carrie Monroe Date of Birth:  February 16, 1936 [facility] [address] [place], [place]  [address] [phone]         Fax   [phone]  History of Present Illness: This pleasant 77 year old married Caucasian female is seen for a workin followup office visit. He has a past history of essential hypertension, hypercholesterolemia, exogenous obesity, diabetes, and aortic stenosis. She is followed medically by Dr. Brigitte Monroe.  She recently saw Dr. Brigitte Monroe regarding increased shortness of breath for the past 2-3 months.  She has gained weight over the past several months but is weighing less than when we last saw her in 2012. The patient has a history of breast cancer. She had lumpectomy and radiation therapy to the left breast 2 years ago.    Current Outpatient Prescriptions  Medication Sig Dispense Refill  . atorvastatin (LIPITOR) 80 MG tablet Take 40 mg by mouth daily.       . bisoprolol-hydrochlorothiazide (ZIAC) 10-6.25 MG per tablet TAKE 1 TABLET DAILY  90 tablet  2  . Calcium Carbonate-Vitamin D (CALCIUM + D PO) Take 1 capsule by mouth daily.      . Cholecalciferol (VITAMIN D-3 PO) Take by mouth daily.        . furosemide (LASIX) 40 MG tablet Take 40 mg by mouth daily.      Marland Kitchen lisinopril (PRINIVIL,ZESTRIL) 20 MG tablet daily.      . metFORMIN (GLUCOPHAGE) 1000 MG tablet BID times 48H.      . tamoxifen (NOLVADEX) 20 MG tablet Take 1 tablet (20 mg total) by mouth daily.  90 tablet  0  . VITAMIN E PO Take by mouth daily.         No current facility-administered medications for this visit.    No Known Allergies  Patient Active Problem List   Diagnosis Date Noted  . Pure hypercholesterolemia 05/08/2011  . Diabetes mellitus without mention of complication 99991111  . Benign hypertensive heart disease without heart failure 05/08/2011  . Aortic stenosis, moderate 05/08/2011  . History of breast cancer 03/16/2011    History  Smoking status  . Never Smoker     Smokeless tobacco  . Not on file    History  Alcohol Use No    Family History  Problem Relation Age of Onset  . Cancer Mother     pancreatic  . Heart disease Father     Review of Systems: Constitutional: no fever chills diaphoresis or fatigue or change in weight.  Head and neck: no hearing loss, no epistaxis, no photophobia or visual disturbance. Respiratory: No cough, shortness of breath or wheezing. Cardiovascular: No chest pain peripheral edema, palpitations. Gastrointestinal: No abdominal distention, no abdominal pain, no change in bowel habits hematochezia or melena. Genitourinary: No dysuria, no frequency, no urgency, no nocturia. Musculoskeletal:No arthralgias, no back pain, no gait disturbance or myalgias. Neurological: No dizziness, no headaches, no numbness, no seizures, no syncope, no weakness, no tremors. Hematologic: No lymphadenopathy, no easy bruising. Psychiatric: No confusion, no hallucinations, no sleep disturbance.    Physical Exam: Filed Vitals:   10/09/12 1339  BP: 143/44  Monroe: 68   the general appearance reveals a obese woman in no acute distress.  Today's weight is 274.The head and neck exam reveals pupils equal and reactive.  Extraocular movements are full.  There is no scleral icterus.  The mouth and pharynx are normal.  The neck is supple.  The carotids reveal no bruits.  The jugular venous pressure is normal.  The  thyroid is not enlarged.  There is no lymphadenopathy.  The chest is clear to percussion and auscultation.  There are no rales or rhonchi.  Expansion of the chest is symmetrical.  The precordium is quiet.  The first heart sound is normal.  The second heart sound is physiologically split.  There is no  gallop rub or click.  There is a grade 99991111 harsh systolic ejection murmur of aortic stenosis at the base.  There is no abnormal lift or heave.  The abdomen is soft and nontender.  The bowel sounds are normal.  The liver and spleen are not  enlarged.  There are no abdominal masses.  There are no abdominal bruits.  Extremities reveal good pedal pulses.  There is mild pretibial and pedal edema.  There is no cyanosis or clubbing.  Strength is normal and symmetrical in all extremities.  There is no lateralizing weakness.  There are no sensory deficits.  The skin is warm and dry.  There is no rash.   EKG today shows normal sinus rhythm and nonspecific T-wave flattening and is unchanged from 05/08/11  Assessment / Plan: Continue same medication.  We will have her return soon for an updated two-dimensional echocardiogram to assess her aortic stenosis in particular as well as her left ventricular function

## 2012-11-03 ENCOUNTER — Ambulatory Visit (HOSPITAL_COMMUNITY): Payer: Medicare Other | Attending: Cardiology | Admitting: Radiology

## 2012-11-03 DIAGNOSIS — Z853 Personal history of malignant neoplasm of breast: Secondary | ICD-10-CM | POA: Insufficient documentation

## 2012-11-03 DIAGNOSIS — I359 Nonrheumatic aortic valve disorder, unspecified: Secondary | ICD-10-CM

## 2012-11-03 DIAGNOSIS — E7800 Pure hypercholesterolemia, unspecified: Secondary | ICD-10-CM | POA: Insufficient documentation

## 2012-11-03 DIAGNOSIS — I350 Nonrheumatic aortic (valve) stenosis: Secondary | ICD-10-CM

## 2012-11-03 NOTE — Progress Notes (Signed)
Echocardiogram performed.  

## 2012-11-04 ENCOUNTER — Other Ambulatory Visit: Payer: Self-pay | Admitting: *Deleted

## 2012-11-04 DIAGNOSIS — C50919 Malignant neoplasm of unspecified site of unspecified female breast: Secondary | ICD-10-CM

## 2012-11-04 MED ORDER — TAMOXIFEN CITRATE 20 MG PO TABS: 20.0000 mg | ORAL_TABLET | Freq: Every day | ORAL | Status: DC

## 2012-11-07 ENCOUNTER — Ambulatory Visit
Admission: RE | Admit: 2012-11-07 | Discharge: 2012-11-07 | Disposition: A | Discharge status: Home / Self Care | Payer: Medicare Other | Source: Ambulatory Visit | Attending: Internal Medicine | Admitting: Internal Medicine

## 2012-11-07 DIAGNOSIS — Z853 Personal history of malignant neoplasm of breast: Secondary | ICD-10-CM

## 2012-11-19 ENCOUNTER — Other Ambulatory Visit (HOSPITAL_COMMUNITY): Payer: Self-pay | Admitting: Internal Medicine

## 2012-11-19 DIAGNOSIS — R0602 Shortness of breath: Secondary | ICD-10-CM

## 2012-11-25 ENCOUNTER — Ambulatory Visit (HOSPITAL_COMMUNITY)
Admission: RE | Admit: 2012-11-25 | Discharge: 2012-11-25 | Disposition: A | Discharge status: Home / Self Care | Payer: Medicare Other | Source: Ambulatory Visit | Attending: Internal Medicine | Admitting: Internal Medicine

## 2012-11-25 DIAGNOSIS — R0602 Shortness of breath: Secondary | ICD-10-CM | POA: Insufficient documentation

## 2012-11-25 MED ORDER — ALBUTEROL SULFATE (5 MG/ML) 0.5% IN NEBU
2.5000 mg | INHALATION_SOLUTION | Freq: Once | RESPIRATORY_TRACT | Status: AC
  Administered 2012-11-25: 2.5 mg via RESPIRATORY_TRACT

## 2013-03-04 ENCOUNTER — Ambulatory Visit (HOSPITAL_BASED_OUTPATIENT_CLINIC_OR_DEPARTMENT_OTHER): Payer: Medicare Other | Admitting: Oncology

## 2013-03-04 ENCOUNTER — Telehealth: Payer: Self-pay | Admitting: *Deleted

## 2013-03-04 ENCOUNTER — Other Ambulatory Visit (HOSPITAL_BASED_OUTPATIENT_CLINIC_OR_DEPARTMENT_OTHER): Payer: Medicare Other | Admitting: Lab

## 2013-03-04 ENCOUNTER — Encounter: Payer: Self-pay | Admitting: Oncology

## 2013-03-04 VITALS — BP 133/75 | HR 62 | Temp 98.2°F | Resp 19 | Ht 64.0 in | Wt 268.4 lb

## 2013-03-04 DIAGNOSIS — Z853 Personal history of malignant neoplasm of breast: Secondary | ICD-10-CM

## 2013-03-04 DIAGNOSIS — C50912 Malignant neoplasm of unspecified site of left female breast: Secondary | ICD-10-CM

## 2013-03-04 DIAGNOSIS — C50419 Malignant neoplasm of upper-outer quadrant of unspecified female breast: Secondary | ICD-10-CM

## 2013-03-04 DIAGNOSIS — Z170 Estrogen receptor positive status [ER+]: Secondary | ICD-10-CM

## 2013-03-04 LAB — CBC WITH DIFFERENTIAL/PLATELET
BASO%: 1.2 % (ref 0.0–2.0)
EOS%: 6 % (ref 0.0–7.0)
MCH: 32.3 pg (ref 25.1–34.0)
MCHC: 34.1 g/dL (ref 31.5–36.0)
RBC: 3.58 10*6/uL — ABNORMAL LOW (ref 3.70–5.45)
RDW: 13 % (ref 11.2–14.5)
lymph#: 1.6 10*3/uL (ref 0.9–3.3)

## 2013-03-04 LAB — COMPREHENSIVE METABOLIC PANEL (CC13)
ALT: 18 U/L (ref 0–55)
AST: 20 U/L (ref 5–34)
Albumin: 3.1 g/dL — ABNORMAL LOW (ref 3.5–5.0)
Calcium: 9.5 mg/dL (ref 8.4–10.4)
Chloride: 109 mEq/L (ref 98–109)
Creatinine: 1.1 mg/dL (ref 0.6–1.1)
Potassium: 3.9 mEq/L (ref 3.5–5.1)

## 2013-03-04 NOTE — Progress Notes (Signed)
OFFICE PROGRESS NOTE  CC  [name], MD [place], [place]. [place] [address]  DIAGNOSIS:  Carrie Monroe is a 77 year old [place] woman, who underwent a mammogram on 10/21/2007, which showed a density in the upper outer left breast.  She returned for a spot compression view and ultrasound on 10/24/2007, which showed a 0.6 cm hypoechoic mass 0.8 x 0.8 at 2 o'clock position 6 cm from the nipple.  A biopsy was recommended.  Biopsy on 10/28/2007 showed invasive mammary carcinoma with some tubular features.  Definite LVI was not seen.  This appeared to be low-grade, ER and PR positive 98% and 10% respectively, 5% proliferative index, HER-2 was 2+, and FISH ratio was 1.42.  The patient subsequently had a preoperative MRI scan, which demonstrated a solitary 1 x 0.8 x 1.1 cm enhancement 3 o'clock position left breast.  No other abnormalities were seen.    PRIOR THERAPY:  She underwent a left lumpectomy and sentinel lymph node evaluation on 11/26/2007.  Final pathology showed a 1.2 cm grade 1 of 3 invasive ductal cancer with no evidence of lymphovascular invasion.  Three sentinel lymph nodes were identified all of which were negative for malignancy.  Surgical margins were ultimately rendered clear of cancer.  She underwent radiation therapy under the care of Dr. Valere Dross from 01/22/08 to 02/19/08.  She started Femara in October 2009 and was switched to Aromasin shortly after initiation due to severe hot flashes.  Due to side effects, she was switched to Tamoxifen in December 2009 and has tolerated it well since.    CURRENT THERAPY:  Tamoxifen 20 mg daily.  INTERVAL HISTORY: Carrie Monroe 77 y.o. female returns for continued follow up.  She is tolerating Tamoxifen well, with hot flashes tapering off after several months of use.  She denies vaginal bleeding or discharge.  Her swelling in the lower extremities has improved. She has not noticed any blood  clots no tenderness. Remainder of the 10 point review of systems is negative.   MEDICAL HISTORY: Past Medical History  Diagnosis Date  . Cancer     left breast  . Hypertension   . Hyperlipidemia   . Heart murmur   . Diabetes mellitus   . Depression   . Anxiety   . Exogenous obesity   . Aortic stenosis   . Breast cancer   . Bilateral lower extremity edema     ALLERGIES:  has No Known Allergies.  MEDICATIONS:  Current Outpatient Prescriptions  Medication Sig Dispense Refill  . atorvastatin (LIPITOR) 80 MG tablet Take 40 mg by mouth daily.       . bisoprolol-hydrochlorothiazide (ZIAC) 10-6.25 MG per tablet TAKE 1 TABLET DAILY  90 tablet  2  . Calcium Carbonate-Vitamin D (CALCIUM + D PO) Take 1 capsule by mouth daily.      . Cholecalciferol (VITAMIN D-3 PO) Take by mouth daily.        . furosemide (LASIX) 40 MG tablet Take 40 mg by mouth daily.      Marland Kitchen lisinopril (PRINIVIL,ZESTRIL) 20 MG tablet daily.      . metFORMIN (GLUCOPHAGE) 1000 MG tablet BID times 48H.      . tamoxifen (NOLVADEX) 20 MG tablet Take 1 tablet (20 mg total) by mouth daily.  90 tablet  1  . VITAMIN E PO Take by mouth daily.         No current facility-administered medications for this visit.    SURGICAL HISTORY:  Past Surgical History  Procedure Laterality Date  . Breast lumpectomy  2009    left  . Cholecystectomy  2010  . Shoulder surgery      right  . Knee surgery      right  . US echocardiography  05/15/2010    EF 60-65%    REVIEW OF SYSTEMS:   Constitutional: Feels well.  No fever or chills. Cardiovascular: Shortness of breath on exertion.  Bilateral lower extremity edema x 1 week.  No chest pain. Pulmonary: Non-productive cough for two to three weeks.  No wheeze.  Gastrointestinal: No nausea, vomiting, or diarrhea. No bright red blood per rectum or change in bowel movement.  Genitourinary: No frequency, urgency, or dysuria. No vaginal bleeding or discharge.  Musculoskeletal: No myalgia or  joint pain.  Limited with ambulating due to edema and shortness of breath. Neurologic: Reports "my left leg almost gave out on me when I was walking because of the swelling."  No weakness, numbness, or change in gait.  Psychology: Reports episodes of insomnia that resolve after sleeping in her recliner.  No depression or anxiety.  HEALTH MAINTENANCE: Mammogram: 11/06/2011 Colonoscopy: 2 to 3 years ago Bone Density:  Due to have, refuses to have since she does not want to take medication for bone loss. Pap Smear:  02/2012 Eye Exam:  Yearly Vitamin D: 08/2011 Lipid Panel: Managed by Dr. Brigitte Pulse  PHYSICAL EXAMINATION: Blood pressure 133/75, pulse 62, temperature 98.2 F (36.8 C), temperature source Oral, resp. rate 19, height 5\' 4"  (1.626 m), weight 268 lb 6.4 oz (121.745 kg). Body mass index is 46.05 kg/(m^2). ECOG PERFORMANCE STATUS: 1 - Symptomatic but completely ambulatory  General: Well developed, well nourished female in no acute distress. Alert and oriented x 3.  Head/ Neck: Oropharynx clear.  Sclerae anicteric.  Supple without any enlargements.  Lymph node survey: No cervical, supraclavicular, or axillary adenopathy  Cardiovascular: Regular rate and rhythm. S1 and S2 normal.  Systolic murmur noted.  Lungs: Clear to auscultation bilaterally. No wheezes/crackles/rhonchi noted.  Skin: No rashes or lesions present. Back: No CVA tenderness.  Abdomen: Abdomen soft, non-tender and obese. Active bowel sounds in all quadrants. No evidence of a fluid wave or abdominal masses.  Breasts: Inspection negative with no nodularity, masses, erythema, or discharge noted bilaterally.  Left breast lumpectomy scar benign with chronic telangiectasias present.  Evidence of a deep seroma present, which was noted as chronic in nature at her last visit.  No lymphadenopathy. Extremities: 2+ pitting edema noted in bilateral lower extremities.  Edema noted to the back of the left knee with no tenderness, erythema, or  increased warmth observed.  Limited range of motion of the lower extremities due to edema.  Negative Homan's sign bilaterally.  No bilateral cyanosis or clubbing.    LABORATORY DATA: Lab Results  Component Value Date   WBC 5.3 03/04/2013   HGB 11.5* 03/04/2013   HCT 33.8* 03/04/2013   MCV 94.6 03/04/2013   PLT 119* 03/04/2013      Chemistry      Component Value Date/Time   NA 143 03/04/2013 1122   NA 140 08/30/2011 1250   K 3.9 03/04/2013 1122   K 4.4 08/30/2011 1250   CL 111* 08/28/2012 1224   CL 108 08/30/2011 1250   CO2 25 03/04/2013 1122   CO2 20 08/30/2011 1250   BUN 23.0 03/04/2013 1122   BUN 22 08/30/2011 1250   CREATININE 1.1 03/04/2013 1122   CREATININE 0.85 08/30/2011 1250  Component Value Date/Time   CALCIUM 9.5 03/04/2013 1122   CALCIUM 9.4 08/30/2011 1250   ALKPHOS 64 03/04/2013 1122   ALKPHOS 71 08/30/2011 1250   AST 20 03/04/2013 1122   AST 30 08/30/2011 1250   ALT 18 03/04/2013 1122   ALT 28 08/30/2011 1250   BILITOT 0.45 03/04/2013 1122   BILITOT 0.5 08/30/2011 1250       RADIOGRAPHIC STUDIES:  No results found.  ASSESSMENT: 77 year old [place] woman: #1  S/P left lumpectomy with sentinel lymph node biopsy on 11/26/07 for a T1c N0 IDC of the left breast, Stage I, grade 1, ER 98%, PR 10%, Ki67 5%, HER2 2+.  #2  She underwent radiation therapy under the care of Dr. Valere Dross from 01/22/08 to 02/19/08.  #3  She started Femara in October of 2009, was switched to Aromasin for a short time, then to Tamoxifen in December of 2009. #4 thrombocytopenia observation    PLAN:    #1 continue tamoxifen 20 mg daily.  #2 she'll be seen back in 6 months time for followup   All questions were answered. The patient knows to call the clinic with any problems, questions or concerns. We can certainly see the patient much sooner if necessary.  Marcy Panning, MD Medical/Oncology [facility] [phone] (beeper) [phone] (Office)  03/04/2013, 4:44 PM

## 2013-03-04 NOTE — Telephone Encounter (Signed)
appts made and printed...td 

## 2013-06-08 ENCOUNTER — Telehealth: Payer: Self-pay | Admitting: *Deleted

## 2013-06-08 NOTE — Telephone Encounter (Signed)
Scheduled appointment for patient.

## 2013-06-08 NOTE — Telephone Encounter (Signed)
Left message to call back  Received surgical clearance request from [facility], needs appointment per  Dr. Mare Ferrari.

## 2013-06-22 ENCOUNTER — Ambulatory Visit (INDEPENDENT_AMBULATORY_CARE_PROVIDER_SITE_OTHER): Payer: Medicare Other | Admitting: Cardiology

## 2013-06-22 ENCOUNTER — Encounter (INDEPENDENT_AMBULATORY_CARE_PROVIDER_SITE_OTHER): Payer: Self-pay

## 2013-06-22 VITALS — BP 164/72 | HR 63 | Ht 64.0 in | Wt 274.0 lb

## 2013-06-22 DIAGNOSIS — R0609 Other forms of dyspnea: Secondary | ICD-10-CM

## 2013-06-22 DIAGNOSIS — R0989 Other specified symptoms and signs involving the circulatory and respiratory systems: Secondary | ICD-10-CM

## 2013-06-22 DIAGNOSIS — R079 Chest pain, unspecified: Secondary | ICD-10-CM

## 2013-06-22 DIAGNOSIS — M109 Gout, unspecified: Secondary | ICD-10-CM | POA: Insufficient documentation

## 2013-06-22 DIAGNOSIS — I350 Nonrheumatic aortic (valve) stenosis: Secondary | ICD-10-CM | POA: Insufficient documentation

## 2013-06-22 DIAGNOSIS — I359 Nonrheumatic aortic valve disorder, unspecified: Secondary | ICD-10-CM

## 2013-06-22 DIAGNOSIS — R0600 Dyspnea, unspecified: Secondary | ICD-10-CM

## 2013-06-22 NOTE — Assessment & Plan Note (Signed)
The patient is not having any symptoms from her mild aortic stenosis at this point

## 2013-06-22 NOTE — Assessment & Plan Note (Signed)
Her symptoms of chest tightness and bilateral arm tightness with exertion are concerning for possible ischemic heart disease.  Prior to quitting cardiac clearance for upcoming orthopedic surgery, I have suggested that we do a 2 day Lexa scan Myoview stress test to rule out ischemia.  I also want her to restart taking a baby aspirin daily.

## 2013-06-22 NOTE — Assessment & Plan Note (Signed)
The patient has had recent acute gout of the right great toe.  She states that she has never been on allopurinol.  We are checking blood work today and I am going to also check a uric acid.  Since she was noted to have an toe pain I told her to stop taking the Indocin since it might have an adverse effect on her cardiovascular system and her blood pressure.

## 2013-06-22 NOTE — Patient Instructions (Addendum)
Will obtain labs today and call you with the results (bmet/hfp/uric acid)  START ASPIRIN 81 MG DAILY  STOP YOUR INDOCIN   Your physician has requested that you have a lexiscan myoview. For further information please visit [email or web address]. Please follow instruction sheet, as given. 2 DAY  Return in February for office visit

## 2013-06-22 NOTE — Progress Notes (Signed)
Carrie Monroe Date of Birth:  1935/12/12 [address] [place], [place]  [address] [phone]         Fax   [phone]  History of Present Illness: This pleasant 78 year old married Caucasian female is seen for a scheduled office visit. He has a past history of essential hypertension, hypercholesterolemia, exogenous obesity, diabetes, and aortic stenosis.  The patient has a past history of breast cancer. She had lumpectomy and radiation therapy to the left breast 3 years ago.  Recently the patient has had more problems with arthritis.  She is scheduled for a left total knee replacement by Dr. Wynelle Link pending surgical clearance. The patient gives a history of having been experiencing recent chest pressure with bilateral arm pressure which occurs when she exercises or walks and is relieved by sitting down and resting.  The patient had stopped taking her furosemide.  She has had recent increase in fluid accumulation.  She restarted her Lasix 3 days ago and the symptoms of chest pressure have improved since restarting Lasix the patient does not have any history of known ischemic heart disease though she does have multiple risk factors for ischemic heart disease. The patient is also been battling acute gout in her right great toe.  Her podiatrist gave her some Indocin which has helped and that the toe is no longer uncomfortable. The patient had an echocardiogram to follow her aortic stenosis on 11/03/12.  This showed that she has mild aortic stenosis with a peak gradient of 33 and a mean gradient of 21.  She has normal LV function with ejection fraction 60%   Current Outpatient Prescriptions  Medication Sig Dispense Refill  . aspirin 81 MG tablet Take 81 mg by mouth daily.      Marland Kitchen atorvastatin (LIPITOR) 80 MG tablet Take 40 mg by mouth daily.       . bisoprolol-hydrochlorothiazide (ZIAC) 10-6.25 MG per tablet TAKE 1 TABLET DAILY  90 tablet  2  . Cholecalciferol (VITAMIN D-3 PO)  Take by mouth daily.        . furosemide (LASIX) 40 MG tablet Take 40 mg by mouth daily.      Marland Kitchen lisinopril (PRINIVIL,ZESTRIL) 20 MG tablet daily.      . tamoxifen (NOLVADEX) 20 MG tablet Take 1 tablet (20 mg total) by mouth daily.  90 tablet  1  . VITAMIN E PO Take by mouth daily.         No current facility-administered medications for this visit.    No Known Allergies  Patient Active Problem List   Diagnosis Date Noted  . Chest pain on exertion 06/22/2013  . Gout 06/22/2013  . Dyspnea on exertion 10/09/2012  . Pure hypercholesterolemia 05/08/2011  . Type II or unspecified type diabetes mellitus without mention of complication, uncontrolled 05/08/2011  . Benign hypertensive heart disease without heart failure 05/08/2011  . Aortic stenosis, moderate 05/08/2011  . History of breast cancer 03/16/2011    History  Smoking status  . Never Smoker   Smokeless tobacco  . Not on file    History  Alcohol Use No    Family History  Problem Relation Age of Onset  . Cancer Mother     pancreatic  . Heart disease Father     Review of Systems: Constitutional: no fever chills diaphoresis or fatigue or change in weight.  Head and neck: no hearing loss, no epistaxis, no photophobia or visual disturbance. Respiratory: No cough, shortness of breath or wheezing. Cardiovascular:  No chest pain peripheral edema, palpitations. Gastrointestinal: No abdominal distention, no abdominal pain, no change in bowel habits hematochezia or melena. Genitourinary: No dysuria, no frequency, no urgency, no nocturia. Musculoskeletal:No arthralgias, no back pain, no gait disturbance or myalgias. Neurological: No dizziness, no headaches, no numbness, no seizures, no syncope, no weakness, no tremors. Hematologic: No lymphadenopathy, no easy bruising. Psychiatric: No confusion, no hallucinations, no sleep disturbance.    Physical Exam: Filed Vitals:   06/22/13 1509  BP: 164/72  Pulse: 63   the general  appearance reveals a obese woman in no acute distress.  Today's weight is 274.The head and neck exam reveals pupils equal and reactive.  Extraocular movements are full.  There is no scleral icterus.  The mouth and pharynx are normal.  The neck is supple.  The carotids reveal no bruits.  The jugular venous pressure is normal.  The  thyroid is not enlarged.  There is no lymphadenopathy.  The chest is clear to percussion and auscultation.  There are no rales or rhonchi.  Expansion of the chest is symmetrical.  The precordium is quiet.  The first heart sound is normal.  The second heart sound is physiologically split.  There is no  gallop rub or click.  There is a grade 99991111 harsh systolic ejection murmur of aortic stenosis at the base.  There is no abnormal lift or heave.  The abdomen is soft and nontender.  The bowel sounds are normal.  The liver and spleen are not enlarged.  There are no abdominal masses.  There are no abdominal bruits.  Extremities reveal good pedal pulses.  There is mild pretibial and pedal edema.  There is no cyanosis or clubbing.  Strength is normal and symmetrical in all extremities.  There is no lateralizing weakness.  There are no sensory deficits.  The skin is warm and dry.  There is no rash.   EKG today shows normal sinus rhythm and nonspecific T-wave flattening and is unchanged from 10/09/12.  She has U waves suggesting possible hypokalemia.  Assessment / Plan: Continue same medication.  Add baby aspirin one daily.  Return for a two-day Myoview stress test with Lexa scan. Must lose weight.  Continue daily Lasix.  We're checking lab work today including basal metabolic panel, hepatic function panel, and uric acid level.  She may be a candidate for allopurinol.  I have asked her to let me recheck her in February at which point we would be able to decide on surgical clearance for her March 9 orthopedic surgery on her left knee

## 2013-06-23 LAB — BASIC METABOLIC PANEL
BUN: 43 mg/dL — ABNORMAL HIGH (ref 6–23)
CALCIUM: 9.9 mg/dL (ref 8.4–10.5)
CO2: 25 meq/L (ref 19–32)
Chloride: 112 mEq/L (ref 96–112)
Creatinine, Ser: 1.3 mg/dL — ABNORMAL HIGH (ref 0.4–1.2)
GFR: 43.56 mL/min — AB (ref >60.00)
GLUCOSE: 244 mg/dL — AB (ref 70–99)
POTASSIUM: 3.9 meq/L (ref 3.5–5.1)
SODIUM: 144 meq/L (ref 135–145)

## 2013-06-23 LAB — HEPATIC FUNCTION PANEL
ALK PHOS: 75 U/L (ref 39–117)
ALT: 30 U/L (ref 0–35)
AST: 24 U/L (ref 0–37)
Albumin: 3.6 g/dL (ref 3.5–5.2)
BILIRUBIN DIRECT: 0.1 mg/dL (ref 0.0–0.3)
TOTAL PROTEIN: 7 g/dL (ref 6.0–8.3)
Total Bilirubin: 0.9 mg/dL (ref 0.3–1.2)

## 2013-06-23 LAB — URIC ACID: Uric Acid, Serum: 10 mg/dL — ABNORMAL HIGH (ref 2.4–7.0)

## 2013-06-24 ENCOUNTER — Telehealth: Payer: Self-pay | Admitting: Cardiology

## 2013-06-24 DIAGNOSIS — M109 Gout, unspecified: Secondary | ICD-10-CM

## 2013-06-24 MED ORDER — ALLOPURINOL 100 MG PO TABS: ORAL_TABLET | ORAL | Status: DC

## 2013-06-24 NOTE — Telephone Encounter (Signed)
Advised patient of lab results, sent to PCP, and Rx sent to [facility]. Patient verbalized understanding.

## 2013-06-24 NOTE — Telephone Encounter (Signed)
New message ° ° ° ° ° ° ° ° ° °Pt returning nurses call °

## 2013-06-24 NOTE — Telephone Encounter (Signed)
Message copied by Earvin Hansen on Wed Jun 24, 2013  1:13 PM ------      Message from: Darlin Coco      Created: Tue Jun 23, 2013  3:33 PM       Blood sugar very high 244. Needs to be on a strict diabetic diet and needs to follow up with her PCP.  The kidney function is worse probably secondary to the recent indocin.   The LFTs are normal. The uric acid level is high c/w gout.  Add allopurinol 100 mg two tablets daily. ------

## 2013-07-09 ENCOUNTER — Encounter (HOSPITAL_COMMUNITY): Payer: Medicare Other | Attending: Cardiovascular Disease | Admitting: Radiology

## 2013-07-09 VITALS — BP 97/51 | HR 52 | Ht 64.0 in | Wt 271.0 lb

## 2013-07-09 DIAGNOSIS — R0600 Dyspnea, unspecified: Secondary | ICD-10-CM

## 2013-07-09 DIAGNOSIS — R079 Chest pain, unspecified: Secondary | ICD-10-CM

## 2013-07-09 DIAGNOSIS — R0602 Shortness of breath: Secondary | ICD-10-CM

## 2013-07-09 DIAGNOSIS — I350 Nonrheumatic aortic (valve) stenosis: Secondary | ICD-10-CM

## 2013-07-09 DIAGNOSIS — R0609 Other forms of dyspnea: Secondary | ICD-10-CM

## 2013-07-09 DIAGNOSIS — I359 Nonrheumatic aortic valve disorder, unspecified: Secondary | ICD-10-CM | POA: Insufficient documentation

## 2013-07-09 MED ORDER — REGADENOSON 0.4 MG/5ML IV SOLN
0.4000 mg | Freq: Once | INTRAVENOUS | Status: AC
  Administered 2013-07-09: 0.4 mg via INTRAVENOUS

## 2013-07-09 MED ORDER — TECHNETIUM TC 99M SESTAMIBI GENERIC - CARDIOLITE
33.0000 | Freq: Once | INTRAVENOUS | Status: AC | PRN
  Administered 2013-07-09: 33 via INTRAVENOUS

## 2013-07-09 NOTE — Progress Notes (Signed)
[facility] [address] [place], [place] [address] [phone]    Cardiology Nuclear Med Study  Carrie Monroe is a 78 y.o. female     MRN : [id number]     DOB: 01/25/1936  Procedure Date: 07/09/2013  Nuclear Med Background Indication for Stress Test:  Evaluation for Ischemia and Surgical Clearance: TKR with Dr. Wynelle Link on 08/03/13 History:  No known CAD, Echo 2014 EF 60%,  Cardiac Risk Factors: Family History - CAD, Hypertension, Lipids and NIDDM  Symptoms:  Chest Pain, Chest Pain with Exertion (last date of chest discomfort was 3 weeks ago) and DOE   Nuclear Pre-Procedure Caffeine/Decaff Intake:  None NPO After: 8:00am   Lungs:  clear O2 Sat: 96% on room air. IV 0.9% NS with Angio Cath:  22g  IV Site: R Forearm  IV Started by:  Annye Rusk, CNMT  Chest Size (in):  52 Cup Size: D  Height: 5\' 4"  (1.626 m)  Weight:  271 lb (122.925 kg)  BMI:  Body mass index is 46.49 kg/(m^2). Tech Comments:  No Ziac x 24 hrs    Nuclear Med Study 1 or 2 day study: 2 day  Stress Test Type:  Lexiscan  Reading MD: n/a  Order Authorizing Provider:  Henrene Dodge  Resting Radionuclide: Technetium 106m Sestamibi  Resting Radionuclide Dose: 33.0 mCi on 07/13/13   Stress Radionuclide:  Technetium 62m Sestamibi  Stress Radionuclide Dose: 33.0 mCi on 07/09/13           Stress Protocol Rest HR: 52 Stress HR: 75  Rest BP: 97/51 Stress BP: 128/68  Exercise Time (min): n/a METS: n/a           Dose of Adenosine (mg):  n/a Dose of Lexiscan: 0.4 mg  Dose of Atropine (mg): n/a Dose of Dobutamine: n/a mcg/kg/min (at max HR)  Stress Test Technologist: Glade Lloyd, BS-ES  Nuclear Technologist:  Charlton Amor, CNMT     Rest Procedure:  Myocardial perfusion imaging was performed at rest 45 minutes following the intravenous administration of Technetium 28m Sestamibi. Rest ECG: NSR - Normal EKG  Stress Procedure:  The patient received IV Lexiscan 0.4 mg over  15-seconds.  Technetium 47m Sestamibi injected at 30-seconds.  Quantitative spect images were obtained after a 45 minute delay.  During the infusion of Lexiscan, the patient had a cough and complained of SOB and head feeling funny. These symptoms began to resolve in recovery.  Stress ECG: No significant change from baseline ECG  QPS Raw Data Images:  Normal; no motion artifact; normal heart/lung ratio. Stress Images:  There is a small area moderate severity perfusion defect in the basal and mid anterolateral wall. Rest Images:  There is a small area mild severity perfusion defect in the basal anterolateral wall. Subtraction (SDS):  A partially reversible defect in the basal and mid anterolateral walls. (SDS 4) Transient Ischemic Dilatation (Normal <1.22):  0.78 Lung/Heart Ratio (Normal <0.45):  0.37  Quantitative Gated Spect Images QGS EDV:  92 ml QGS ESV:  37 ml  Impression Exercise Capacity:  Lexiscan with no exercise. BP Response:  Normal blood pressure response. Clinical Symptoms:  There is dyspnea. ECG Impression:  No significant ST segment change suggestive of ischemia. Comparison with Prior Nuclear Study: No previous nuclear study performed  Overall Impression:  Intermediate risk stress nuclear study with a small scar and periinfarct ischemia in the LCX/OM territory (SDS 4). .  LV Ejection Fraction: 60%.  LV Wall Motion:  NL LV Function; NL Wall Motion  [name], [name] 07/13/2013

## 2013-07-13 ENCOUNTER — Ambulatory Visit (HOSPITAL_COMMUNITY): Payer: Medicare Other | Attending: Cardiology | Admitting: Radiology

## 2013-07-13 ENCOUNTER — Other Ambulatory Visit (HOSPITAL_COMMUNITY): Payer: Self-pay | Admitting: Cardiology

## 2013-07-13 DIAGNOSIS — R079 Chest pain, unspecified: Secondary | ICD-10-CM

## 2013-07-13 DIAGNOSIS — R0989 Other specified symptoms and signs involving the circulatory and respiratory systems: Secondary | ICD-10-CM

## 2013-07-13 MED ORDER — TECHNETIUM TC 99M SESTAMIBI GENERIC - CARDIOLITE
33.0000 | Freq: Once | INTRAVENOUS | Status: AC | PRN
  Administered 2013-07-13: 33 via INTRAVENOUS

## 2013-07-15 ENCOUNTER — Encounter (HOSPITAL_COMMUNITY): Payer: Medicare Other

## 2013-07-16 ENCOUNTER — Encounter (HOSPITAL_COMMUNITY): Payer: Self-pay | Admitting: *Deleted

## 2013-07-16 ENCOUNTER — Telehealth: Payer: Self-pay | Admitting: Cardiology

## 2013-07-16 NOTE — Telephone Encounter (Signed)
Received request from Nurse fax box, documents faxed for surgical clearance. To: [employer] Fax number: [phone] Attention: [id number]/kdm

## 2013-07-20 NOTE — Progress Notes (Signed)
Need orders in EPIC.  Surgery scheduled for 08/03/13.  Preop on 3/3/at 1pm.  Thank You.

## 2013-07-22 ENCOUNTER — Ambulatory Visit (HOSPITAL_COMMUNITY): Payer: Medicare Other | Attending: Cardiology

## 2013-07-22 ENCOUNTER — Ambulatory Visit (INDEPENDENT_AMBULATORY_CARE_PROVIDER_SITE_OTHER): Payer: Medicare Other | Admitting: Cardiology

## 2013-07-22 ENCOUNTER — Encounter: Payer: Self-pay | Admitting: Cardiology

## 2013-07-22 VITALS — BP 160/82 | HR 64 | Ht 63.0 in | Wt 277.0 lb

## 2013-07-22 DIAGNOSIS — R0989 Other specified symptoms and signs involving the circulatory and respiratory systems: Secondary | ICD-10-CM

## 2013-07-22 DIAGNOSIS — E785 Hyperlipidemia, unspecified: Secondary | ICD-10-CM | POA: Insufficient documentation

## 2013-07-22 DIAGNOSIS — R079 Chest pain, unspecified: Secondary | ICD-10-CM

## 2013-07-22 DIAGNOSIS — IMO0002 Reserved for concepts with insufficient information to code with codable children: Secondary | ICD-10-CM

## 2013-07-22 DIAGNOSIS — M179 Osteoarthritis of knee, unspecified: Secondary | ICD-10-CM

## 2013-07-22 DIAGNOSIS — E119 Type 2 diabetes mellitus without complications: Secondary | ICD-10-CM | POA: Insufficient documentation

## 2013-07-22 DIAGNOSIS — I6529 Occlusion and stenosis of unspecified carotid artery: Secondary | ICD-10-CM

## 2013-07-22 DIAGNOSIS — M1710 Unilateral primary osteoarthritis, unspecified knee: Secondary | ICD-10-CM | POA: Insufficient documentation

## 2013-07-22 DIAGNOSIS — I658 Occlusion and stenosis of other precerebral arteries: Secondary | ICD-10-CM | POA: Insufficient documentation

## 2013-07-22 DIAGNOSIS — I119 Hypertensive heart disease without heart failure: Secondary | ICD-10-CM

## 2013-07-22 MED ORDER — POTASSIUM CHLORIDE CRYS ER 20 MEQ PO TBCR: EXTENDED_RELEASE_TABLET | ORAL | Status: DC

## 2013-07-22 NOTE — Assessment & Plan Note (Signed)
The patient has not had any TIA symptoms.  Continue baby aspirin daily.

## 2013-07-22 NOTE — Patient Instructions (Signed)
ADD K DUR 20 MEQ 1-2 A DAY  TAKE YOUR LASIX EVERY DAY  Your physician recommends that you schedule a follow-up appointment in: 2 MONTH OV/BMET

## 2013-07-22 NOTE — Progress Notes (Signed)
Surgery scheduled for 08/03/13.  Preop on 07/28/13 at 100pm.  Need orders in EPIC.  Thank You.

## 2013-07-22 NOTE — Assessment & Plan Note (Signed)
The patient has not been experiencing any exertional chest pressure.  She has not had to take any sublingual nitroglycerin.

## 2013-07-22 NOTE — Assessment & Plan Note (Signed)
The patient is very limited in terms of being able to walk because of her left knee pain.  She should benefit from left total knee replacement.  Following her surgery [facility].  Her [facility] will also be placed at [facility] for respite care until she is able to look after him at home

## 2013-07-22 NOTE — Assessment & Plan Note (Signed)
Systolic blood pressure is elevated today.  This is secondary to not taking her Lasix on a regular basis and she does have mild volume overload on exam with mild pretibial peripheral edema.  I encouraged her to take the Lasix every day without fail.  We will add K. Dur 20 mEq daily.

## 2013-07-22 NOTE — Progress Notes (Signed)
[name] Date of Birth:  Feb 11, 1936 [address] [place], [place]  [address] [phone]         Fax   [phone]  History of Present Illness: This pleasant 78 year old married Caucasian female is seen for a scheduled office visit. He has a past history of essential hypertension, hypercholesterolemia, exogenous obesity, diabetes, and aortic stenosis.  The patient has a past history of breast cancer. She had lumpectomy and radiation therapy to the left breast 3 years ago.  Recently the patient has had more problems with arthritis.  The patient is scheduled for left total knee replacement on March 9 by Dr. Wynelle Link. The patient has a history of diastolic heart failure.  If she fails to take her Lasix she experiences shortness of breath.  She previously had also complained of some chest discomfort which resolved after her heart failure improved.  The patient had a Lexi scan Myoview stress test on 07/14/13 showing an ejection fraction of 60% with no wall motion abnormalities and a small scar in the basal anterolateral area with minimal peri-infarct ischemia.  She has been cleared for surgery.  The patient has known mild aortic stenosis.The patient had an echocardiogram on 11/03/12.  This showed that she has mild aortic stenosis with a peak gradient of 33 and a mean gradient of 21.  She has normal LV function with ejection fraction 60%.  The patient has a harsh systolic murmur at the base radiating to the neck.  She underwent carotid duplex on 07/22/13 showing moderate heterogeneous plaque bilaterally with 60-79% bilateral internal carotid artery stenosis and patent vertebral arteries with antegrade flow.  The patient has not been having any TIA symptoms.  She is on baby aspirin daily. The patient has had a 3 pound weight gain since last visit.  She had hoped to lose weight.  She has not been taking her Lasix on a regular basis however because of leg cramps after taking Lasix.  We  will supplement with potassium and encourage her to take her Lasix every day.   Current Outpatient Prescriptions  Medication Sig Dispense Refill  . allopurinol (ZYLOPRIM) 100 MG tablet 2 tablets daily  60 tablet  5  . aspirin 81 MG tablet Take 81 mg by mouth daily.      Marland Kitchen atorvastatin (LIPITOR) 40 MG tablet Take 40 mg by mouth daily.      . bisoprolol-hydrochlorothiazide (ZIAC) 10-6.25 MG per tablet TAKE 1 TABLET DAILY  90 tablet  2  . Cholecalciferol (VITAMIN D-3 PO) Take by mouth daily.        . furosemide (LASIX) 40 MG tablet Take 40 mg by mouth as directed. EVERY OTHER DAY OR EVERY TWO DAYS      . lisinopril (PRINIVIL,ZESTRIL) 20 MG tablet daily.      . tamoxifen (NOLVADEX) 20 MG tablet Take 1 tablet (20 mg total) by mouth daily.  90 tablet  1  . VITAMIN E PO Take by mouth daily.        . potassium chloride SA (K-DUR,KLOR-CON) 20 MEQ tablet 1 TO 2 DAILAY  60 tablet  3   No current facility-administered medications for this visit.    No Known Allergies  Patient Active Problem List   Diagnosis Date Noted  . Carotid artery bruit 07/22/2013  . Bilateral leg cramps 07/22/2013  . Osteoarthritis of knee 07/22/2013  . Chest pain on exertion 06/22/2013  . Gout 06/22/2013  . Aortic stenosis, mild 06/22/2013  . Dyspnea  on exertion 10/09/2012  . Pure hypercholesterolemia 05/08/2011  . Type II or unspecified type diabetes mellitus without mention of complication, uncontrolled 05/08/2011  . Benign hypertensive heart disease without heart failure 05/08/2011  . History of breast cancer 03/16/2011    History  Smoking status  . Never Smoker   Smokeless tobacco  . Not on file    History  Alcohol Use No    Family History  Problem Relation Age of Onset  . Cancer Mother     pancreatic  . Heart disease Father     Review of Systems: Constitutional: no fever chills diaphoresis or fatigue or change in weight.  Head and neck: no hearing loss, no epistaxis, no photophobia or visual  disturbance. Respiratory: No cough, shortness of breath or wheezing. Cardiovascular: No chest pain peripheral edema, palpitations. Gastrointestinal: No abdominal distention, no abdominal pain, no change in bowel habits hematochezia or melena. Genitourinary: No dysuria, no frequency, no urgency, no nocturia. Musculoskeletal:No arthralgias, no back pain, no gait disturbance or myalgias. Neurological: No dizziness, no headaches, no numbness, no seizures, no syncope, no weakness, no tremors. Hematologic: No lymphadenopathy, no easy bruising. Psychiatric: No confusion, no hallucinations, no sleep disturbance.    Physical Exam: Filed Vitals:   07/22/13 1258  BP: 160/82  Pulse: 64   the general appearance reveals a obese woman in no acute distress.  Today's weight is 274.The head and neck exam reveals pupils equal and reactive.  Extraocular movements are full.  There is no scleral icterus.  The mouth and pharynx are normal.  The neck is supple.  The carotids reveal bilateral bruits.  The jugular venous pressure is normal.  The  thyroid is not enlarged.  There is no lymphadenopathy.  The chest is clear to percussion and auscultation.  There are no rales or rhonchi.  Expansion of the chest is symmetrical.  The precordium is quiet.  The first heart sound is normal.  The second heart sound is physiologically split.  There is no  gallop rub or click.  There is a grade [id number] harsh systolic ejection murmur of aortic stenosis at the base with radiation to the neck bilaterally.  There is no abnormal lift or heave.  The abdomen is soft and nontender.  The bowel sounds are normal.  The liver and spleen are not enlarged.  There are no abdominal masses.  There are no abdominal bruits.  Extremities reveal good pedal pulses.  There is mild pretibial and pedal edema.  There is no cyanosis or clubbing.  Strength is normal and symmetrical in all extremities.  There is no lateralizing weakness.  There are no sensory  deficits.  The skin is warm and dry.  There is no rash.     Assessment / Plan: 1. chronic diastolic CHF with fluid retention 2. ischemic heart disease with old small anterolateral scar on recent Myoview.  Ejection fraction 60%. 3. diabetes mellitus 4. exogenous obesity 5. Hyperlipidemia 6. essential hypertension 7. asymptomatic moderate bilateral carotid artery disease. 8. osteoarthritis of left knee 9. mild aortic stenosis with radiation of murmur up into the neck bilaterally 10.  Past history of breast cancer  Plan: Proceed with left knee replacement as planned. Importance of taking her Lasix 40 mg every day was stressed to the patient We will add K-Dur 20 milliequivalents one or 2 daily to prevent diuretic induced leg cramps. The patient has not been taking her Lipitor on a regular basis.  With demonstrated carotid plaque she needs to take her  Lipitor every day.  Recheck here in 2 months for followup office visit and basal metabolic panel.

## 2013-07-24 ENCOUNTER — Encounter (HOSPITAL_COMMUNITY): Payer: Self-pay | Admitting: Pharmacy Technician

## 2013-07-24 NOTE — Progress Notes (Signed)
Surgery on 08/03/13 .  Preop on 3/3 at 100pm.  Need orders in EPIC.  Thank You.

## 2013-07-28 ENCOUNTER — Encounter (HOSPITAL_COMMUNITY)
Admission: RE | Admit: 2013-07-28 | Discharge: 2013-07-28 | Disposition: A | Discharge status: Home / Self Care | Payer: Medicare Other | Source: Ambulatory Visit | Attending: Orthopedic Surgery | Admitting: Orthopedic Surgery

## 2013-07-28 ENCOUNTER — Encounter (HOSPITAL_COMMUNITY): Payer: Self-pay

## 2013-07-28 ENCOUNTER — Other Ambulatory Visit: Payer: Self-pay | Admitting: Orthopedic Surgery

## 2013-07-28 DIAGNOSIS — Z01812 Encounter for preprocedural laboratory examination: Secondary | ICD-10-CM | POA: Insufficient documentation

## 2013-07-28 HISTORY — DX: Personal history of urinary calculi: Z87.442

## 2013-07-28 HISTORY — DX: Unspecified hemorrhoids: K64.9

## 2013-07-28 HISTORY — DX: Occlusion and stenosis of unspecified carotid artery: I65.29

## 2013-07-28 HISTORY — DX: Gout, unspecified: M10.9

## 2013-07-28 HISTORY — DX: Unspecified osteoarthritis, unspecified site: M19.90

## 2013-07-28 LAB — URINE MICROSCOPIC-ADD ON

## 2013-07-28 LAB — APTT: aPTT: 26 seconds (ref 24–37)

## 2013-07-28 LAB — COMPREHENSIVE METABOLIC PANEL
ALK PHOS: 91 U/L (ref 39–117)
ALT: 25 U/L (ref 0–35)
AST: 27 U/L (ref 0–37)
Albumin: 3.7 g/dL (ref 3.5–5.2)
BUN: 28 mg/dL — AB (ref 6–23)
CO2: 27 meq/L (ref 19–32)
Calcium: 9.9 mg/dL (ref 8.4–10.5)
Chloride: 99 mEq/L (ref 96–112)
Creatinine, Ser: 1.15 mg/dL — ABNORMAL HIGH (ref 0.50–1.10)
GFR calc Af Amer: 53 mL/min — ABNORMAL LOW (ref >90)
GFR, EST NON AFRICAN AMERICAN: 45 mL/min — AB (ref >90)
Glucose, Bld: 208 mg/dL — ABNORMAL HIGH (ref 70–99)
Potassium: 4.9 mEq/L (ref 3.7–5.3)
SODIUM: 138 meq/L (ref 137–147)
Total Bilirubin: 0.5 mg/dL (ref 0.3–1.2)
Total Protein: 7.2 g/dL (ref 6.0–8.3)

## 2013-07-28 LAB — CBC
HEMATOCRIT: 37.4 % (ref 36.0–46.0)
HEMOGLOBIN: 12.2 g/dL (ref 12.0–15.0)
MCH: 31.1 pg (ref 26.0–34.0)
MCHC: 32.6 g/dL (ref 30.0–36.0)
MCV: 95.4 fL (ref 78.0–100.0)
Platelets: 176 10*3/uL (ref 150–400)
RBC: 3.92 MIL/uL (ref 3.87–5.11)
RDW: 14 % (ref 11.5–15.5)
WBC: 6.9 10*3/uL (ref 4.0–10.5)

## 2013-07-28 LAB — URINALYSIS, ROUTINE W REFLEX MICROSCOPIC
BILIRUBIN URINE: NEGATIVE
Glucose, UA: NEGATIVE mg/dL
HGB URINE DIPSTICK: NEGATIVE
Ketones, ur: NEGATIVE mg/dL
Nitrite: NEGATIVE
Protein, ur: NEGATIVE mg/dL
SPECIFIC GRAVITY, URINE: 1.019 (ref 1.005–1.030)
Urobilinogen, UA: 1 mg/dL (ref 0.0–1.0)
pH: 7 (ref 5.0–8.0)

## 2013-07-28 LAB — PROTIME-INR
INR: 1.04 (ref 0.00–1.49)
PROTHROMBIN TIME: 13.4 s (ref 11.6–15.2)

## 2013-07-28 LAB — SURGICAL PCR SCREEN
MRSA, PCR: NEGATIVE
Staphylococcus aureus: NEGATIVE

## 2013-07-28 LAB — ABO/RH: ABO/RH(D): A POS

## 2013-07-28 NOTE — Patient Instructions (Addendum)
   YOUR SURGERY IS SCHEDULED AT [facility]  ON:  Monday  3/9  REPORT TO  SHORT STAY CENTER AT:  9:40 AM      PHONE # FOR SHORT STAY IS [phone]  DO NOT EAT ANYTHING AFTER MIDNIGHT THE NIGHT BEFORE YOUR SURGERY.  YOU MAY BRUSH YOUR TEETH  - NO FOOD, NO CHEWING GUM, NO MINTS, NO CANDIES, NO CHEWING TOBACCO. YOU MAY HAVE CLEAR LIQUIDS TO DRINK FROM MIDNIGHT UNTIL 6:30 AM DAY OF SURGERY - LIKE WATER, SODA.  NOTHING TO DRINK AFTER 6:30 AM DAY OF SURGERY.  PLEASE TAKE THE FOLLOWING MEDICATIONS THE AM OF YOUR SURGERY WITH A FEW SIPS OF WATER:  ALLOPURINOL, TAMOXIFEN.  DO NOT BRING VALUABLES, MONEY, CREDIT CARDS.  DO NOT WEAR JEWELRY, MAKE-UP, NAIL POLISH AND NO METAL PINS OR CLIPS IN YOUR HAIR. CONTACT LENS, DENTURES / PARTIALS, GLASSES SHOULD NOT BE WORN TO SURGERY AND IN MOST CASES-HEARING AIDS WILL NEED TO BE REMOVED.  BRING YOUR GLASSES CASE, ANY EQUIPMENT NEEDED FOR YOUR CONTACT LENS. FOR PATIENTS ADMITTED TO THE HOSPITAL--CHECK OUT TIME THE DAY OF DISCHARGE IS 11:00 AM.  ALL INPATIENT ROOMS ARE PRIVATE - WITH BATHROOM, TELEPHONE, TELEVISION AND WIFI INTERNET.                                                    PLEASE READ OVER ANY  FACT SHEETS THAT YOU WERE GIVEN: MRSA INFORMATION, BLOOD TRANSFUSION INFORMATION, INCENTIVE SPIROMETER INFORMATION.  FAILURE TO FOLLOW THESE INSTRUCTIONS MAY RESULT IN THE CANCELLATION OF YOUR SURGERY. PLEASE BE AWARE THAT YOU MAY NEED ADDITIONAL BLOOD DRAWN DAY OF YOUR SURGERY  PATIENT SIGNATURE_________________________________  PT NOTIFIED HER SURGERY TIME WAS MOVED UP TO 7:15 AM ON Monday  3/9  - SO SHE NEEDS TO ARRIVE TO SHORT STAY BY 5:00 AM - AND NOTHING TO EAT OR DRINK AFTER MIDNIGHT NIGHT BEFORE SURGERY - JUST A COUPLE OF SIPS OF WATER TO TAKE HER MEDICATIONS - MAY NOT HAVE CLEAR LIQUIDS AFTER MIDNIGHT.

## 2013-07-28 NOTE — Pre-Procedure Instructions (Addendum)
CXR REPORT ON CHART FROM DR. SHAW - DONE 10/07/12.  MEDICAL CLEARANCE ON CHART FROM DR. SHAW. CARDIAC CLEARANCE DR. BRACKBILL ON CHART; EKG 06/22/13  AND CARDIOLOGY OFFICE NOTES 07/22/13, NUCLEAR STRESS TEST 07/09/13  AND ECHO 11/04/12 [facility] CAROTID STUDIES 2/25 15  IN EPIC.

## 2013-07-29 NOTE — Pre-Procedure Instructions (Signed)
PT'S PREOP CMET REPORT FAXED TO DR. ALUISIO'S OFFICE FOR REVIEW OF BUN, CREATININE.

## 2013-08-02 ENCOUNTER — Other Ambulatory Visit: Payer: Self-pay | Admitting: Orthopedic Surgery

## 2013-08-02 MED ORDER — DEXTROSE 5 % IV SOLN
3.0000 g | INTRAVENOUS | Status: AC
  Administered 2013-08-03: 3 g via INTRAVENOUS
  Filled 2013-08-02 (×2): qty 3000

## 2013-08-02 NOTE — H&P (Signed)
Carrie Monroe  DOB: [birth year] Married / Language: [name] / Race: White Female  Date of Admission:  08-03-2013  Chief Complaint:  Left Knee Pain  History of Present Illness The patient is a 78 year old female who comes in today for a preoperative History and Physical. The patient is scheduled for a left total knee arthroplasty to be performed by Dr. Dione Plover. Aluisio, MD at [facility] on 08-03-2013. They have been seen for left knee pain, swelling and instability. The patient is a 78 year old female who presents for follow up of their knee. The patient is being followed for their left knee pain. They are now several week(s) out from Synvisc injections. Symptoms reported today include: pain. The patient feels that they are doing poorly. The following medication has been used for pain control: Ultram and Aleve. Note for "Follow-up Knee": Patient states that she has not had any improvement with the injections or the pain medicine that she is taking. Unfortunately, the Synvisc did not help her at all. She is having pain at all times. The knee is limiting what she can and cannot do. She has had Synvisc and cortisone without benefit. She is ready to proceed with the knee replacement. They have been treated conservatively in the past for the above stated problem and despite conservative measures, they continue to have progressive pain and severe functional limitations and dysfunction. They have failed non-operative management including home exercise, medications, and injections. It is felt that they would benefit from undergoing total joint replacement. Risks and benefits of the procedure have been discussed with the patient and they elect to proceed with surgery. There are no active contraindications to surgery such as ongoing infection or rapidly progressive neurological disease.  Allergies No Known Drug Allergies  Problem List/Past Medical Primary osteoarthritis of one knee  (715.16). left Breast Cancer. Left Sided - AVOID LEFT ARM Heart murmur Kidney Stone Shingles Depression Hemorrhoids Urinary Tract Infection Non-Insulin Dependent Diabetes Mellitus Gout Menopause Hypercholesterolemia  Family History Heart Disease. father Hypertension. father Cancer. First Degree Relatives. mother and brother  Social History Pain Contract. no Number of flights of stairs before winded. less than 1 Current work status. retired Marital status. married Living situation. live with spouse Illicit drug use. no Drug/Alcohol Rehab (Currently). no Alcohol use. never consumed alcohol Tobacco use. Never smoker. never smoker Children. 4 Exercise. Exercises rarely Drug/Alcohol Rehab (Previously). no Post-Surgical Plans. Plan is to look into [facility] for inpatient skilled rehab.   Medication History TraMADol HCl (50MG  Tablet, 1-2 Tablet Oral every 6-8 hours as needed for pain, Taken starting 07/03/2013) Active. Bisoprolol-Hydrochlorothiazide (10-6.25MG  Tablet, Oral) Active. Furosemide (40MG  Tablet, Oral) Active. Lisinopril (20MG  Tablet, Oral) Active. Tamoxifen Citrate (20MG  Tablet, Oral) Active. Aleve (220MG  Capsule, 1 (one) Oral) Active. Vitamin D (400UNIT Capsule, 1 (one) Oral) Active. Vitamin E (200UNIT Capsule, 1 (one) Oral) Active. Allopurinol ( Oral) Specific dose unknown - Active. Aspirin Childrens (81MG  Tablet Chewable, Oral) Active. Lipitor (40MG  Tablet, Oral) Active.  Past Surgical History Gallbladder Surgery. laporoscopic Rotator Cuff Repair. right Breast Biopsy. left Breast Mass; Local Excision. left Cataract Surgery. bilateral Arthroscopy of Knee. right  Review of Systems General:Not Present- Chills, Fever, Night Sweats, Fatigue, Weight Gain, Weight Loss and Memory Loss. Skin:Not Present- Hives, Itching, Rash, Eczema and Lesions. HEENT:Not Present- Tinnitus, Headache, Double Vision, Visual Loss,  Hearing Loss and Dentures. Respiratory:Present- Shortness of breath with exertion (but relieved with rest). Not Present- Shortness of breath at rest, Allergies, Coughing up  blood and Chronic Cough. Cardiovascular:Not Present- Chest Pain, Racing/skipping heartbeats, Difficulty Breathing Lying Down, Murmur, Swelling and Palpitations. Gastrointestinal:Not Present- Bloody Stool, Heartburn, Abdominal Pain, Vomiting, Nausea, Constipation, Diarrhea, Difficulty Swallowing, Jaundice and Loss of appetitie. Female Genitourinary:Not Present- Blood in Urine, Urinary frequency, Weak urinary stream, Discharge, Flank Pain, Incontinence, Painful Urination, Urgency, Urinary Retention and Urinating at Night. Musculoskeletal:Present- Joint Pain and Back Pain. Not Present- Muscle Weakness, Muscle Pain, Joint Swelling, Morning Stiffness and Spasms. Neurological:Not Present- Tremor, Dizziness, Blackout spells, Paralysis, Difficulty with balance and Weakness. Psychiatric:Not Present- Insomnia.    Vitals Weight: 260 lb Height: 63 in Body Surface Area: 2.29 m Body Mass Index: 46.06 kg/m Pulse: 68 (Regular) Resp.: 12 (Unlabored) BP: 140/80 (Sitting, Left Arm, Standard)     Physical Exam The physical exam findings are as follows:   General Mental Status - Alert, cooperative and good historian. General Appearance- pleasant. Not in acute distress. Orientation- Oriented X3. Build & Nutrition- Overweight, Obese and Well developed.   Head and Neck Head- normocephalic, atraumatic . Neck Global Assessment- bruit auscultated on the right, bruit auscultated on the left and supple. Carotid Arteries- Left- bruit. Right- bruit.   Eye Pupil- Bilateral- Regular and Round. Motion- Bilateral- EOMI.   Chest and Lung Exam Auscultation: Breath sounds:- clear at anterior chest wall and - clear at posterior chest wall. Adventitious sounds:- No Adventitious  sounds.   Cardiovascular Auscultation:Rhythm- Regular rate and rhythm. Heart Sounds- S1 WNL and S2 WNL. Murmurs & Other Heart Sounds: Murmur 1:Location- Aortic Area, Carotids, Pulmonic Area and Sternal Border - Left. Timing- Holosystolic. Grade- III/VI. Character- Crescendo/Decrescendo and Holosystolic.   Abdomen Inspection:Contour- Generalized moderate distention. Palpation/Percussion:Tenderness- Abdomen is non-tender to palpation. Rigidity (guarding)- Abdomen is soft. Auscultation:Auscultation of the abdomen reveals - Bowel sounds normal.   Female Genitourinary Not done, not pertinent to present illness  Musculoskeletal On exam well developed female in no distress. Her left knee shows no effusion. Her left hip has motion with no discomfort. The knee shows range of motion about 5 to 120. There is moderate crepitus on range of motion. There is tenderness medial greater than lateral with no instability noted.  RADIOGRAPHS: Radiographs are reviewed from two months ago and she has bone on bone arthritis medial and patellofemoral.   Assessment & Plan Primary osteoarthritis of one knee (715.16) Story: left Impression: Left Knee  Note: Plan is for a Left Total Knee Replacement by Dr. Wynelle Link.  Plan is to go to [facility]  PCP - Dr. Brigitte Pulse Patient has been seen preoperatively and felt to be stable for surgery. Cardiology - Dr. Mare Ferrari - At the time of the preop visit, the patient was currently undergoing workup which was pending at that time.  The patient will not receive TXA (tranexamic acid) due to: Breast Cancer  Arlee Muslim, PA-C

## 2013-08-03 ENCOUNTER — Encounter (HOSPITAL_COMMUNITY): Payer: Self-pay | Admitting: Anesthesiology

## 2013-08-03 ENCOUNTER — Inpatient Hospital Stay (HOSPITAL_COMMUNITY): Payer: Medicare Other | Admitting: Certified Registered Nurse Anesthetist

## 2013-08-03 ENCOUNTER — Encounter (HOSPITAL_COMMUNITY): Payer: Medicare Other | Admitting: Certified Registered Nurse Anesthetist

## 2013-08-03 ENCOUNTER — Encounter (HOSPITAL_COMMUNITY)
Admission: RE | Disposition: A | Discharge status: Skilled Nursing Facility | Payer: Self-pay | Source: Ambulatory Visit | Attending: Orthopedic Surgery

## 2013-08-03 ENCOUNTER — Inpatient Hospital Stay (HOSPITAL_COMMUNITY)
Admission: RE | Admit: 2013-08-03 | Discharge: 2013-08-05 | DRG: 470 | Disposition: A | Discharge status: Skilled Nursing Facility | Payer: Medicare Other | Source: Ambulatory Visit | Attending: Orthopedic Surgery | Admitting: Orthopedic Surgery

## 2013-08-03 DIAGNOSIS — Z7982 Long term (current) use of aspirin: Secondary | ICD-10-CM

## 2013-08-03 DIAGNOSIS — M179 Osteoarthritis of knee, unspecified: Secondary | ICD-10-CM

## 2013-08-03 DIAGNOSIS — D62 Acute posthemorrhagic anemia: Secondary | ICD-10-CM | POA: Diagnosis not present

## 2013-08-03 DIAGNOSIS — Z87442 Personal history of urinary calculi: Secondary | ICD-10-CM

## 2013-08-03 DIAGNOSIS — C50919 Malignant neoplasm of unspecified site of unspecified female breast: Secondary | ICD-10-CM | POA: Diagnosis present

## 2013-08-03 DIAGNOSIS — E785 Hyperlipidemia, unspecified: Secondary | ICD-10-CM | POA: Diagnosis present

## 2013-08-03 DIAGNOSIS — I6529 Occlusion and stenosis of unspecified carotid artery: Secondary | ICD-10-CM | POA: Diagnosis present

## 2013-08-03 DIAGNOSIS — E871 Hypo-osmolality and hyponatremia: Secondary | ICD-10-CM | POA: Diagnosis not present

## 2013-08-03 DIAGNOSIS — I10 Essential (primary) hypertension: Secondary | ICD-10-CM | POA: Diagnosis present

## 2013-08-03 DIAGNOSIS — I359 Nonrheumatic aortic valve disorder, unspecified: Secondary | ICD-10-CM | POA: Diagnosis present

## 2013-08-03 DIAGNOSIS — F329 Major depressive disorder, single episode, unspecified: Secondary | ICD-10-CM | POA: Diagnosis present

## 2013-08-03 DIAGNOSIS — M109 Gout, unspecified: Secondary | ICD-10-CM | POA: Diagnosis present

## 2013-08-03 DIAGNOSIS — Z79899 Other long term (current) drug therapy: Secondary | ICD-10-CM

## 2013-08-03 DIAGNOSIS — Z96652 Presence of left artificial knee joint: Secondary | ICD-10-CM

## 2013-08-03 DIAGNOSIS — Z6841 Body Mass Index (BMI) 40.0 and over, adult: Secondary | ICD-10-CM

## 2013-08-03 DIAGNOSIS — Z8249 Family history of ischemic heart disease and other diseases of the circulatory system: Secondary | ICD-10-CM

## 2013-08-03 DIAGNOSIS — M1710 Unilateral primary osteoarthritis, unspecified knee: Principal | ICD-10-CM | POA: Diagnosis present

## 2013-08-03 DIAGNOSIS — E7800 Pure hypercholesterolemia, unspecified: Secondary | ICD-10-CM | POA: Diagnosis present

## 2013-08-03 DIAGNOSIS — Z923 Personal history of irradiation: Secondary | ICD-10-CM

## 2013-08-03 DIAGNOSIS — I509 Heart failure, unspecified: Secondary | ICD-10-CM | POA: Diagnosis present

## 2013-08-03 DIAGNOSIS — E119 Type 2 diabetes mellitus without complications: Secondary | ICD-10-CM | POA: Diagnosis present

## 2013-08-03 DIAGNOSIS — F411 Generalized anxiety disorder: Secondary | ICD-10-CM | POA: Diagnosis present

## 2013-08-03 DIAGNOSIS — R011 Cardiac murmur, unspecified: Secondary | ICD-10-CM | POA: Diagnosis present

## 2013-08-03 DIAGNOSIS — F3289 Other specified depressive episodes: Secondary | ICD-10-CM | POA: Diagnosis present

## 2013-08-03 HISTORY — PX: TOTAL KNEE ARTHROPLASTY: SHX125

## 2013-08-03 LAB — GLUCOSE, CAPILLARY
Glucose-Capillary: 182 mg/dL — ABNORMAL HIGH (ref 70–99)
Glucose-Capillary: 210 mg/dL — ABNORMAL HIGH (ref 70–99)

## 2013-08-03 LAB — TYPE AND SCREEN
ABO/RH(D): A POS
Antibody Screen: NEGATIVE

## 2013-08-03 SURGERY — ARTHROPLASTY, KNEE, TOTAL
Anesthesia: Spinal | Site: Knee | Laterality: Left

## 2013-08-03 MED ORDER — BISOPROLOL-HYDROCHLOROTHIAZIDE 10-6.25 MG PO TABS: 1.0000 | ORAL_TABLET | Freq: Every morning | ORAL | Status: DC

## 2013-08-03 MED ORDER — LACTATED RINGERS IV SOLN
INTRAVENOUS | Status: DC | PRN
  Administered 2013-08-03: 07:00:00 via INTRAVENOUS

## 2013-08-03 MED ORDER — POTASSIUM CHLORIDE CRYS ER 20 MEQ PO TBCR
20.0000 meq | EXTENDED_RELEASE_TABLET | Freq: Every day | ORAL | Status: DC
  Administered 2013-08-03 – 2013-08-05 (×3): 20 meq via ORAL
  Filled 2013-08-03 (×3): qty 1

## 2013-08-03 MED ORDER — OXYCODONE HCL 5 MG PO TABS
5.0000 mg | ORAL_TABLET | ORAL | Status: DC | PRN
  Administered 2013-08-03 – 2013-08-05 (×12): 10 mg via ORAL
  Filled 2013-08-03 (×13): qty 2

## 2013-08-03 MED ORDER — DIPHENHYDRAMINE HCL 12.5 MG/5ML PO ELIX: 12.5000 mg | ORAL_SOLUTION | ORAL | Status: DC | PRN

## 2013-08-03 MED ORDER — BUPIVACAINE HCL 0.25 % IJ SOLN
INTRAMUSCULAR | Status: DC | PRN
  Administered 2013-08-03: 20 mL

## 2013-08-03 MED ORDER — SODIUM CHLORIDE 0.9 % IV SOLN
INTRAVENOUS | Status: DC
  Administered 2013-08-03 – 2013-08-04 (×2): via INTRAVENOUS

## 2013-08-03 MED ORDER — HYDROCHLOROTHIAZIDE 10 MG/ML ORAL SUSPENSION
6.2500 mg | Freq: Every day | ORAL | Status: DC
  Administered 2013-08-03 – 2013-08-05 (×3): 6.25 mg via ORAL
  Filled 2013-08-03 (×3): qty 1.25

## 2013-08-03 MED ORDER — METOCLOPRAMIDE HCL 10 MG PO TABS: 5.0000 mg | ORAL_TABLET | Freq: Three times a day (TID) | ORAL | Status: DC | PRN

## 2013-08-03 MED ORDER — FENTANYL CITRATE 0.05 MG/ML IJ SOLN
INTRAMUSCULAR | Status: DC | PRN
  Administered 2013-08-03: 50 ug via INTRAVENOUS

## 2013-08-03 MED ORDER — MIDAZOLAM HCL 2 MG/2ML IJ SOLN
INTRAMUSCULAR | Status: AC
  Filled 2013-08-03: qty 2

## 2013-08-03 MED ORDER — ONDANSETRON HCL 4 MG/2ML IJ SOLN
INTRAMUSCULAR | Status: AC
  Filled 2013-08-03: qty 2

## 2013-08-03 MED ORDER — SODIUM CHLORIDE 0.9 % IJ SOLN
INTRAMUSCULAR | Status: AC
  Filled 2013-08-03: qty 10

## 2013-08-03 MED ORDER — METHOCARBAMOL 100 MG/ML IJ SOLN
500.0000 mg | Freq: Four times a day (QID) | INTRAMUSCULAR | Status: DC | PRN
  Filled 2013-08-03: qty 5

## 2013-08-03 MED ORDER — DEXAMETHASONE SODIUM PHOSPHATE 10 MG/ML IJ SOLN
10.0000 mg | Freq: Every day | INTRAMUSCULAR | Status: AC
  Filled 2013-08-03: qty 1

## 2013-08-03 MED ORDER — CHLORHEXIDINE GLUCONATE 4 % EX LIQD: 60.0000 mL | Freq: Once | CUTANEOUS | Status: DC

## 2013-08-03 MED ORDER — ACETAMINOPHEN 650 MG RE SUPP: 650.0000 mg | Freq: Four times a day (QID) | RECTAL | Status: DC | PRN

## 2013-08-03 MED ORDER — INSULIN ASPART 100 UNIT/ML ~~LOC~~ SOLN
0.0000 [IU] | Freq: Three times a day (TID) | SUBCUTANEOUS | Status: DC
  Administered 2013-08-03: 5 [IU] via SUBCUTANEOUS

## 2013-08-03 MED ORDER — EPHEDRINE SULFATE 50 MG/ML IJ SOLN
INTRAMUSCULAR | Status: AC
  Filled 2013-08-03: qty 1

## 2013-08-03 MED ORDER — HYDROMORPHONE HCL PF 1 MG/ML IJ SOLN: 0.2500 mg | INTRAMUSCULAR | Status: DC | PRN

## 2013-08-03 MED ORDER — ACETAMINOPHEN 325 MG PO TABS: 650.0000 mg | ORAL_TABLET | Freq: Four times a day (QID) | ORAL | Status: DC | PRN

## 2013-08-03 MED ORDER — SODIUM CHLORIDE 0.9 % IJ SOLN
INTRAMUSCULAR | Status: AC
  Filled 2013-08-03: qty 20

## 2013-08-03 MED ORDER — BISACODYL 10 MG RE SUPP: 10.0000 mg | Freq: Every day | RECTAL | Status: DC | PRN

## 2013-08-03 MED ORDER — KETOROLAC TROMETHAMINE 15 MG/ML IJ SOLN
7.5000 mg | Freq: Four times a day (QID) | INTRAMUSCULAR | Status: AC | PRN
  Administered 2013-08-03 – 2013-08-04 (×2): 7.5 mg via INTRAVENOUS
  Filled 2013-08-03 (×2): qty 1

## 2013-08-03 MED ORDER — FUROSEMIDE 40 MG PO TABS
40.0000 mg | ORAL_TABLET | ORAL | Status: DC
  Administered 2013-08-05: 40 mg via ORAL
  Filled 2013-08-03 (×2): qty 1

## 2013-08-03 MED ORDER — FENTANYL CITRATE 0.05 MG/ML IJ SOLN
INTRAMUSCULAR | Status: AC
  Filled 2013-08-03: qty 2

## 2013-08-03 MED ORDER — BUPIVACAINE IN DEXTROSE 0.75-8.25 % IT SOLN
INTRATHECAL | Status: DC | PRN
  Administered 2013-08-03: 2 mL via INTRATHECAL

## 2013-08-03 MED ORDER — FLEET ENEMA 7-19 GM/118ML RE ENEM: 1.0000 | ENEMA | Freq: Once | RECTAL | Status: AC | PRN

## 2013-08-03 MED ORDER — PROPOFOL 10 MG/ML IV BOLUS
INTRAVENOUS | Status: DC | PRN
  Administered 2013-08-03 (×2): 20 mg via INTRAVENOUS

## 2013-08-03 MED ORDER — ONDANSETRON HCL 4 MG PO TABS: 4.0000 mg | ORAL_TABLET | Freq: Four times a day (QID) | ORAL | Status: DC | PRN

## 2013-08-03 MED ORDER — TAMOXIFEN CITRATE 10 MG PO TABS
20.0000 mg | ORAL_TABLET | Freq: Every day | ORAL | Status: DC
  Administered 2013-08-04 – 2013-08-05 (×2): 20 mg via ORAL
  Filled 2013-08-03 (×3): qty 2

## 2013-08-03 MED ORDER — PROMETHAZINE HCL 25 MG/ML IJ SOLN: 6.2500 mg | INTRAMUSCULAR | Status: DC | PRN

## 2013-08-03 MED ORDER — POLYETHYLENE GLYCOL 3350 17 G PO PACK: 17.0000 g | PACK | Freq: Every day | ORAL | Status: DC | PRN

## 2013-08-03 MED ORDER — STERILE WATER FOR IRRIGATION IR SOLN
Status: DC | PRN
  Administered 2013-08-03: 1500 mL

## 2013-08-03 MED ORDER — TAMOXIFEN CITRATE 20 MG PO TABS: 20.0000 mg | ORAL_TABLET | ORAL | Status: DC

## 2013-08-03 MED ORDER — PROPOFOL 10 MG/ML IV BOLUS
INTRAVENOUS | Status: AC
  Filled 2013-08-03: qty 20

## 2013-08-03 MED ORDER — MORPHINE SULFATE 2 MG/ML IJ SOLN
1.0000 mg | INTRAMUSCULAR | Status: DC | PRN
  Administered 2013-08-03: 1 mg via INTRAVENOUS
  Administered 2013-08-04 (×2): 2 mg via INTRAVENOUS
  Filled 2013-08-03 (×3): qty 1

## 2013-08-03 MED ORDER — PROPOFOL INFUSION 10 MG/ML OPTIME
INTRAVENOUS | Status: DC | PRN
  Administered 2013-08-03: 25 ug/kg/min via INTRAVENOUS

## 2013-08-03 MED ORDER — RIVAROXABAN 10 MG PO TABS
10.0000 mg | ORAL_TABLET | Freq: Every day | ORAL | Status: DC
  Administered 2013-08-04 – 2013-08-05 (×2): 10 mg via ORAL
  Filled 2013-08-03 (×3): qty 1

## 2013-08-03 MED ORDER — ONDANSETRON HCL 4 MG/2ML IJ SOLN
INTRAMUSCULAR | Status: DC | PRN
  Administered 2013-08-03: 4 mg via INTRAVENOUS

## 2013-08-03 MED ORDER — ONDANSETRON HCL 4 MG/2ML IJ SOLN: 4.0000 mg | Freq: Four times a day (QID) | INTRAMUSCULAR | Status: DC | PRN

## 2013-08-03 MED ORDER — DEXAMETHASONE SODIUM PHOSPHATE 10 MG/ML IJ SOLN
10.0000 mg | Freq: Once | INTRAMUSCULAR | Status: AC
  Administered 2013-08-03: 10 mg via INTRAVENOUS

## 2013-08-03 MED ORDER — SODIUM CHLORIDE 0.9 % IV SOLN: INTRAVENOUS | Status: DC

## 2013-08-03 MED ORDER — ALLOPURINOL 100 MG PO TABS
100.0000 mg | ORAL_TABLET | Freq: Two times a day (BID) | ORAL | Status: DC
  Administered 2013-08-03 – 2013-08-05 (×4): 100 mg via ORAL
  Filled 2013-08-03 (×6): qty 1

## 2013-08-03 MED ORDER — DOCUSATE SODIUM 100 MG PO CAPS
100.0000 mg | ORAL_CAPSULE | Freq: Two times a day (BID) | ORAL | Status: DC
  Administered 2013-08-03 – 2013-08-05 (×4): 100 mg via ORAL

## 2013-08-03 MED ORDER — INSULIN ASPART 100 UNIT/ML ~~LOC~~ SOLN
SUBCUTANEOUS | Status: AC
  Administered 2013-08-03: 5 [IU] via SUBCUTANEOUS
  Filled 2013-08-03: qty 1

## 2013-08-03 MED ORDER — BUPIVACAINE HCL (PF) 0.25 % IJ SOLN
INTRAMUSCULAR | Status: AC
  Filled 2013-08-03: qty 30

## 2013-08-03 MED ORDER — BISOPROLOL FUMARATE 10 MG PO TABS
10.0000 mg | ORAL_TABLET | Freq: Every day | ORAL | Status: DC
  Administered 2013-08-03 – 2013-08-05 (×3): 10 mg via ORAL
  Filled 2013-08-03 (×3): qty 1

## 2013-08-03 MED ORDER — METHOCARBAMOL 500 MG PO TABS
500.0000 mg | ORAL_TABLET | Freq: Four times a day (QID) | ORAL | Status: DC | PRN
  Administered 2013-08-03 – 2013-08-05 (×7): 500 mg via ORAL
  Filled 2013-08-03 (×7): qty 1

## 2013-08-03 MED ORDER — PHENOL 1.4 % MT LIQD: 1.0000 | OROMUCOSAL | Status: DC | PRN

## 2013-08-03 MED ORDER — DEXAMETHASONE 4 MG PO TABS
10.0000 mg | ORAL_TABLET | Freq: Every day | ORAL | Status: AC
  Administered 2013-08-04: 10 mg via ORAL
  Filled 2013-08-03: qty 1

## 2013-08-03 MED ORDER — MENTHOL 3 MG MT LOZG: 1.0000 | LOZENGE | OROMUCOSAL | Status: DC | PRN

## 2013-08-03 MED ORDER — BUPIVACAINE LIPOSOME 1.3 % IJ SUSP
20.0000 mL | Freq: Once | INTRAMUSCULAR | Status: DC
Start: 2013-08-03 — End: 2013-08-03
  Filled 2013-08-03: qty 20

## 2013-08-03 MED ORDER — TRAMADOL HCL 50 MG PO TABS
50.0000 mg | ORAL_TABLET | Freq: Four times a day (QID) | ORAL | Status: DC | PRN
  Administered 2013-08-03: 100 mg via ORAL
  Filled 2013-08-03: qty 2

## 2013-08-03 MED ORDER — FUROSEMIDE 10 MG/ML IJ SOLN
20.0000 mg | Freq: Once | INTRAMUSCULAR | Status: AC
  Administered 2013-08-03: 20 mg via INTRAVENOUS
  Filled 2013-08-03: qty 2

## 2013-08-03 MED ORDER — ATORVASTATIN CALCIUM 40 MG PO TABS
40.0000 mg | ORAL_TABLET | Freq: Every day | ORAL | Status: DC
  Administered 2013-08-03 – 2013-08-04 (×2): 40 mg via ORAL
  Filled 2013-08-03 (×3): qty 1

## 2013-08-03 MED ORDER — BUPIVACAINE LIPOSOME 1.3 % IJ SUSP
INTRAMUSCULAR | Status: DC | PRN
  Administered 2013-08-03: 20 mL

## 2013-08-03 MED ORDER — LIDOCAINE HCL (CARDIAC) 20 MG/ML IV SOLN
INTRAVENOUS | Status: DC | PRN
  Administered 2013-08-03: 40 mg via INTRAVENOUS

## 2013-08-03 MED ORDER — CEFAZOLIN SODIUM-DEXTROSE 2-3 GM-% IV SOLR
2.0000 g | Freq: Four times a day (QID) | INTRAVENOUS | Status: AC
  Administered 2013-08-03 (×2): 2 g via INTRAVENOUS
  Filled 2013-08-03 (×2): qty 50

## 2013-08-03 MED ORDER — DEXAMETHASONE SODIUM PHOSPHATE 10 MG/ML IJ SOLN
INTRAMUSCULAR | Status: AC
  Filled 2013-08-03: qty 1

## 2013-08-03 MED ORDER — MIDAZOLAM HCL 5 MG/5ML IJ SOLN
INTRAMUSCULAR | Status: DC | PRN
  Administered 2013-08-03: 1 mg via INTRAVENOUS

## 2013-08-03 MED ORDER — 0.9 % SODIUM CHLORIDE (POUR BTL) OPTIME
TOPICAL | Status: DC | PRN
  Administered 2013-08-03: 1000 mL

## 2013-08-03 MED ORDER — ACETAMINOPHEN 500 MG PO TABS
1000.0000 mg | ORAL_TABLET | Freq: Four times a day (QID) | ORAL | Status: AC
  Administered 2013-08-03 – 2013-08-04 (×3): 1000 mg via ORAL
  Filled 2013-08-03 (×3): qty 2

## 2013-08-03 MED ORDER — ATROPINE SULFATE 0.4 MG/ML IJ SOLN
INTRAMUSCULAR | Status: AC
  Filled 2013-08-03: qty 1

## 2013-08-03 MED ORDER — EPHEDRINE SULFATE 50 MG/ML IJ SOLN
INTRAMUSCULAR | Status: DC | PRN
  Administered 2013-08-03 (×2): 5 mg via INTRAVENOUS
  Administered 2013-08-03: 10 mg via INTRAVENOUS

## 2013-08-03 MED ORDER — ACETAMINOPHEN 10 MG/ML IV SOLN
1000.0000 mg | Freq: Once | INTRAVENOUS | Status: AC
  Administered 2013-08-03: 1000 mg via INTRAVENOUS
  Filled 2013-08-03: qty 100

## 2013-08-03 MED ORDER — METOCLOPRAMIDE HCL 5 MG/ML IJ SOLN: 5.0000 mg | Freq: Three times a day (TID) | INTRAMUSCULAR | Status: DC | PRN

## 2013-08-03 MED ORDER — BISOPROLOL FUMARATE 10 MG PO TABS
10.0000 mg | ORAL_TABLET | Freq: Once | ORAL | Status: AC
  Administered 2013-08-03: 10 mg via ORAL
  Filled 2013-08-03: qty 1

## 2013-08-03 MED ORDER — LIDOCAINE HCL (CARDIAC) 20 MG/ML IV SOLN
INTRAVENOUS | Status: AC
  Filled 2013-08-03: qty 5

## 2013-08-03 SURGICAL SUPPLY — 58 items
BAG SPEC THK2 15X12 ZIP CLS (MISCELLANEOUS) ×1
BAG ZIPLOCK 12X15 (MISCELLANEOUS) ×2 IMPLANT
BANDAGE ELASTIC 6 VELCRO ST LF (GAUZE/BANDAGES/DRESSINGS) ×2 IMPLANT
BANDAGE ESMARK 6X9 LF (GAUZE/BANDAGES/DRESSINGS) ×1 IMPLANT
BLADE SAG 18X100X1.27 (BLADE) ×2 IMPLANT
BLADE SAW SGTL 11.0X1.19X90.0M (BLADE) ×2 IMPLANT
BNDG CMPR 9X6 STRL LF SNTH (GAUZE/BANDAGES/DRESSINGS) ×1
BNDG ESMARK 6X9 LF (GAUZE/BANDAGES/DRESSINGS) ×2
BOWL SMART MIX CTS (DISPOSABLE) ×2 IMPLANT
CAPT RP KNEE ×1 IMPLANT
CEMENT HV SMART SET (Cement) ×4 IMPLANT
CLOSURE STERI-STRIP 1/4X4 (GAUZE/BANDAGES/DRESSINGS) ×1 IMPLANT
CUFF TOURN SGL QUICK 34 (TOURNIQUET CUFF) ×2
CUFF TRNQT CYL 34X4X40X1 (TOURNIQUET CUFF) ×1 IMPLANT
DECANTER SPIKE VIAL GLASS SM (MISCELLANEOUS) ×4 IMPLANT
DRAPE EXTREMITY T 121X128X90 (DRAPE) ×2 IMPLANT
DRAPE POUCH INSTRU U-SHP 10X18 (DRAPES) ×2 IMPLANT
DRAPE U-SHAPE 47X51 STRL (DRAPES) ×2 IMPLANT
DRSG ADAPTIC 3X8 NADH LF (GAUZE/BANDAGES/DRESSINGS) ×2 IMPLANT
DURAPREP 26ML APPLICATOR (WOUND CARE) ×2 IMPLANT
ELECT REM PT RETURN 9FT ADLT (ELECTROSURGICAL) ×2
ELECTRODE REM PT RTRN 9FT ADLT (ELECTROSURGICAL) ×1 IMPLANT
EVACUATOR 1/8 PVC DRAIN (DRAIN) ×2 IMPLANT
FACESHIELD LNG OPTICON STERILE (SAFETY) ×10 IMPLANT
GLOVE BIO SURGEON STRL SZ7.5 (GLOVE) IMPLANT
GLOVE BIO SURGEON STRL SZ8 (GLOVE) ×2 IMPLANT
GLOVE BIOGEL PI IND STRL 8 (GLOVE) ×2 IMPLANT
GLOVE BIOGEL PI INDICATOR 8 (GLOVE) ×2
GLOVE SURG SS PI 6.5 STRL IVOR (GLOVE) ×3 IMPLANT
GOWN STRL REUS W/TWL LRG LVL3 (GOWN DISPOSABLE) ×3 IMPLANT
GOWN STRL REUS W/TWL XL LVL3 (GOWN DISPOSABLE) ×2 IMPLANT
HANDPIECE INTERPULSE COAX TIP (DISPOSABLE) ×2
IMMOBILIZER KNEE 20 (SOFTGOODS) ×4 IMPLANT
IMMOBILIZER KNEE 20 THIGH 36 (SOFTGOODS) IMPLANT
KIT BASIN OR (CUSTOM PROCEDURE TRAY) ×2 IMPLANT
MANIFOLD NEPTUNE II (INSTRUMENTS) ×2 IMPLANT
NDL SAFETY ECLIPSE 18X1.5 (NEEDLE) ×2 IMPLANT
NEEDLE HYPO 18GX1.5 SHARP (NEEDLE) ×4
NS IRRIG 1000ML POUR BTL (IV SOLUTION) ×2 IMPLANT
PACK TOTAL JOINT (CUSTOM PROCEDURE TRAY) ×2 IMPLANT
PAD ABD 8X10 STRL (GAUZE/BANDAGES/DRESSINGS) ×2 IMPLANT
PADDING CAST COTTON 6X4 STRL (CAST SUPPLIES) ×4 IMPLANT
POSITIONER SURGICAL ARM (MISCELLANEOUS) ×2 IMPLANT
SET HNDPC FAN SPRY TIP SCT (DISPOSABLE) ×1 IMPLANT
SPONGE GAUZE 4X4 12PLY (GAUZE/BANDAGES/DRESSINGS) ×2 IMPLANT
STRIP CLOSURE SKIN 1/2X4 (GAUZE/BANDAGES/DRESSINGS) ×4 IMPLANT
SUCTION FRAZIER 12FR DISP (SUCTIONS) ×2 IMPLANT
SUT MNCRL AB 4-0 PS2 18 (SUTURE) ×2 IMPLANT
SUT VIC AB 2-0 CT1 27 (SUTURE) ×6
SUT VIC AB 2-0 CT1 TAPERPNT 27 (SUTURE) ×3 IMPLANT
SUT VLOC 180 0 24IN GS25 (SUTURE) ×2 IMPLANT
SYR 20CC LL (SYRINGE) ×2 IMPLANT
SYR 50ML LL SCALE MARK (SYRINGE) ×2 IMPLANT
TOWEL OR 17X26 10 PK STRL BLUE (TOWEL DISPOSABLE) ×2 IMPLANT
TOWEL OR NON WOVEN STRL DISP B (DISPOSABLE) IMPLANT
TRAY FOLEY CATH 14FRSI W/METER (CATHETERS) ×2 IMPLANT
WATER STERILE IRR 1500ML POUR (IV SOLUTION) ×2 IMPLANT
WRAP KNEE MAXI GEL POST OP (GAUZE/BANDAGES/DRESSINGS) ×2 IMPLANT

## 2013-08-03 NOTE — Anesthesia Procedure Notes (Signed)
Spinal  Patient location during procedure: OR Staffing Anesthesiologist: Salley Scarlet Performed by: anesthesiologist  Preanesthetic Checklist Completed: patient identified, site marked, surgical consent, pre-op evaluation, timeout performed, IV checked, risks and benefits discussed and monitors and equipment checked Spinal Block Patient position: sitting Prep: Betadine Patient monitoring: heart rate, continuous pulse ox and blood pressure Approach: midline Location: L4-5 Injection technique: single-shot Needle Needle type: Spinocan  Needle gauge: 22 G Needle length: 9 cm Additional Notes Expiration date of kit checked and confirmed. Patient tolerated procedure well, without complications. No paresthesia. CSF clear.

## 2013-08-03 NOTE — H&P (View-Only) (Signed)
Carrie Monroe  DOB: 1936-03-06 Married / Language: [name] / Race: White Female  Date of Admission:  08-03-2013  Chief Complaint:  Left Knee Pain  History of Present Illness The patient is a 78 year old female who comes in today for a preoperative History and Physical. The patient is scheduled for a left total knee arthroplasty to be performed by Dr. Dione Plover. Aluisio, MD at [facility] on 08-03-2013. They have been seen for left knee pain, swelling and instability. The patient is a 78 year old female who presents for follow up of their knee. The patient is being followed for their left knee pain. They are now several week(s) out from Synvisc injections. Symptoms reported today include: pain. The patient feels that they are doing poorly. The following medication has been used for pain control: Ultram and Aleve. Note for "Follow-up Knee": Patient states that she has not had any improvement with the injections or the pain medicine that she is taking. Unfortunately, the Synvisc did not help her at all. She is having pain at all times. The knee is limiting what she can and cannot do. She has had Synvisc and cortisone without benefit. She is ready to proceed with the knee replacement. They have been treated conservatively in the past for the above stated problem and despite conservative measures, they continue to have progressive pain and severe functional limitations and dysfunction. They have failed non-operative management including home exercise, medications, and injections. It is felt that they would benefit from undergoing total joint replacement. Risks and benefits of the procedure have been discussed with the patient and they elect to proceed with surgery. There are no active contraindications to surgery such as ongoing infection or rapidly progressive neurological disease.  Allergies No Known Drug Allergies  Problem List/Past Medical Primary osteoarthritis of one knee  (715.16). left Breast Cancer. Left Sided - AVOID LEFT ARM Heart murmur Kidney Stone Shingles Depression Hemorrhoids Urinary Tract Infection Non-Insulin Dependent Diabetes Mellitus Gout Menopause Hypercholesterolemia  Family History Heart Disease. father Hypertension. father Cancer. First Degree Relatives. mother and brother  Social History Pain Contract. no Number of flights of stairs before winded. less than 1 Current work status. retired Marital status. married Living situation. live with spouse Illicit drug use. no Drug/Alcohol Rehab (Currently). no Alcohol use. never consumed alcohol Tobacco use. Never smoker. never smoker Children. 4 Exercise. Exercises rarely Drug/Alcohol Rehab (Previously). no Post-Surgical Plans. Plan is to look into [facility] for inpatient skilled rehab.   Medication History TraMADol HCl (50MG  Tablet, 1-2 Tablet Oral every 6-8 hours as needed for pain, Taken starting 07/03/2013) Active. Bisoprolol-Hydrochlorothiazide (10-6.25MG  Tablet, Oral) Active. Furosemide (40MG  Tablet, Oral) Active. Lisinopril (20MG  Tablet, Oral) Active. Tamoxifen Citrate (20MG  Tablet, Oral) Active. Aleve (220MG  Capsule, 1 (one) Oral) Active. Vitamin D (400UNIT Capsule, 1 (one) Oral) Active. Vitamin E (200UNIT Capsule, 1 (one) Oral) Active. Allopurinol ( Oral) Specific dose unknown - Active. Aspirin Childrens (81MG  Tablet Chewable, Oral) Active. Lipitor (40MG  Tablet, Oral) Active.  Past Surgical History Gallbladder Surgery. laporoscopic Rotator Cuff Repair. right Breast Biopsy. left Breast Mass; Local Excision. left Cataract Surgery. bilateral Arthroscopy of Knee. right  Review of Systems General:Not Present- Chills, Fever, Night Sweats, Fatigue, Weight Gain, Weight Loss and Memory Loss. Skin:Not Present- Hives, Itching, Rash, Eczema and Lesions. HEENT:Not Present- Tinnitus, Headache, Double Vision, Visual Loss,  Hearing Loss and Dentures. Respiratory:Present- Shortness of breath with exertion (but relieved with rest). Not Present- Shortness of breath at rest, Allergies, Coughing up  blood and Chronic Cough. Cardiovascular:Not Present- Chest Pain, Racing/skipping heartbeats, Difficulty Breathing Lying Down, Murmur, Swelling and Palpitations. Gastrointestinal:Not Present- Bloody Stool, Heartburn, Abdominal Pain, Vomiting, Nausea, Constipation, Diarrhea, Difficulty Swallowing, Jaundice and Loss of appetitie. Female Genitourinary:Not Present- Blood in Urine, Urinary frequency, Weak urinary stream, Discharge, Flank Pain, Incontinence, Painful Urination, Urgency, Urinary Retention and Urinating at Night. Musculoskeletal:Present- Joint Pain and Back Pain. Not Present- Muscle Weakness, Muscle Pain, Joint Swelling, Morning Stiffness and Spasms. Neurological:Not Present- Tremor, Dizziness, Blackout spells, Paralysis, Difficulty with balance and Weakness. Psychiatric:Not Present- Insomnia.    Vitals Weight: 260 lb Height: 63 in Body Surface Area: 2.29 m Body Mass Index: 46.06 kg/m Pulse: 68 (Regular) Resp.: 12 (Unlabored) BP: 140/80 (Sitting, Left Arm, Standard)     Physical Exam The physical exam findings are as follows:   General Mental Status - Alert, cooperative and good historian. General Appearance- pleasant. Not in acute distress. Orientation- Oriented X3. Build & Nutrition- Overweight, Obese and Well developed.   Head and Neck Head- normocephalic, atraumatic . Neck Global Assessment- bruit auscultated on the right, bruit auscultated on the left and supple. Carotid Arteries- Left- bruit. Right- bruit.   Eye Pupil- Bilateral- Regular and Round. Motion- Bilateral- EOMI.   Chest and Lung Exam Auscultation: Breath sounds:- clear at anterior chest wall and - clear at posterior chest wall. Adventitious sounds:- No Adventitious  sounds.   Cardiovascular Auscultation:Rhythm- Regular rate and rhythm. Heart Sounds- S1 WNL and S2 WNL. Murmurs & Other Heart Sounds: Murmur 1:Location- Aortic Area, Carotids, Pulmonic Area and Sternal Border - Left. Timing- Holosystolic. Grade- III/VI. Character- Crescendo/Decrescendo and Holosystolic.   Abdomen Inspection:Contour- Generalized moderate distention. Palpation/Percussion:Tenderness- Abdomen is non-tender to palpation. Rigidity (guarding)- Abdomen is soft. Auscultation:Auscultation of the abdomen reveals - Bowel sounds normal.   Female Genitourinary Not done, not pertinent to present illness  Musculoskeletal On exam well developed female in no distress. Her left knee shows no effusion. Her left hip has motion with no discomfort. The knee shows range of motion about 5 to 120. There is moderate crepitus on range of motion. There is tenderness medial greater than lateral with no instability noted.  RADIOGRAPHS: Radiographs are reviewed from two months ago and she has bone on bone arthritis medial and patellofemoral.   Assessment & Plan Primary osteoarthritis of one knee (715.16) Story: left Impression: Left Knee  Note: Plan is for a Left Total Knee Replacement by Dr. Wynelle Link.  Plan is to go to [facility] [place]  PCP - Dr. Brigitte Pulse Patient has been seen preoperatively and felt to be stable for surgery. Cardiology - Dr. Mare Ferrari - At the time of the preop visit, the patient was currently undergoing workup which was pending at that time.  The patient will not receive TXA (tranexamic acid) due to: Breast Cancer  Arlee Muslim, PA-C

## 2013-08-03 NOTE — Progress Notes (Signed)
Utilization review completed.  

## 2013-08-03 NOTE — Preoperative (Addendum)
Beta Blockers   Reason not to administer Beta Blockers:Not Applicable, bisoprolol 10 mg given 08/03/13 0545

## 2013-08-03 NOTE — Transfer of Care (Signed)
Immediate Anesthesia Transfer of Care Note  Patient: Carrie Monroe  Procedure(s) Performed: Procedure(s) (LRB): TOTAL LEFT KNEE ARTHROPLASTY (Left)  Patient Location: PACU  Anesthesia Type: Spinal  Level of Consciousness: sedated, patient cooperative and responds to stimulation  Airway & Oxygen Therapy: Patient Spontanous Breathing and Patient connected to face mask oxgen  Post-op Assessment: Report given to PACU RN and Post -op Vital signs reviewed and stable  Post vital signs: Reviewed and stable  Complications: No apparent anesthesia complications

## 2013-08-03 NOTE — Progress Notes (Signed)
PACU note-------Dr. Delma Post notified of CBG result; order rec'd and med given

## 2013-08-03 NOTE — Op Note (Signed)
Pre-operative diagnosis- Osteoarthritis  Left knee(s)  Post-operative diagnosis- Osteoarthritis Left knee(s)  Procedure-  Left  Total Knee Arthroplasty  Surgeon- Dione Plover. Carrie Sharpless, MD  Assistant- Ardeen Jourdain, PA-C   Anesthesia-  Spinal EBL-* No blood loss amount entered *  Drains Hemovac  Tourniquet time-  Total Tourniquet Time Documented: Thigh (Left) - 40 minutes Total: Thigh (Left) - 40 minutes    Complications- None  Condition-PACU - hemodynamically stable.   Brief Clinical Note  Carrie Monroe is a 78 y.o. year old female with end stage OA of her left knee with progressively worsening pain and dysfunction. She has constant pain, with activity and at rest and significant functional deficits with difficulties even with ADLs. She has had extensive non-op management including analgesics, injections of cortisone and viscosupplements, and home exercise program, but remains in significant pain with significant dysfunction. Radiographs show bone on bone arthritis medial and patellofemoral. She presents now for left Total Knee Arthroplasty.    Procedure in detail---   The patient is brought into the operating room and positioned supine on the operating table. After successful administration of  Spinal,   a tourniquet is placed high on the  Left thigh(s) and the lower extremity is prepped and draped in the usual sterile fashion. Time out is performed by the operating team and then the  Left lower extremity is wrapped in Esmarch, knee flexed and the tourniquet inflated to 300 mmHg.       A midline incision is made with a ten blade through the subcutaneous tissue to the level of the extensor mechanism. A fresh blade is used to make a medial parapatellar arthrotomy. Soft tissue over the proximal medial tibia is subperiosteally elevated to the joint line with a knife and into the semimembranosus bursa with a Cobb elevator. Soft tissue over the proximal lateral tibia is elevated with  attention being paid to avoiding the patellar tendon on the tibial tubercle. The patella is everted, knee flexed 90 degrees and the ACL and PCL are removed. Findings are bone on bone medial and patellofemoral with large medial osteophytes.        The drill is used to create a starting hole in the distal femur and the canal is thoroughly irrigated with sterile saline to remove the fatty contents. The 5 degree Left  valgus alignment guide is placed into the femoral canal and the distal femoral cutting block is pinned to remove 10 mm off the distal femur. Resection is made with an oscillating saw.      The tibia is subluxed forward and the menisci are removed. The extramedullary alignment guide is placed referencing proximally at the medial aspect of the tibial tubercle and distally along the second metatarsal axis and tibial crest. The block is pinned to remove 50mm off the more deficient medial  side. Resection is made with an oscillating saw. Size 3is the most appropriate size for the tibia and the proximal tibia is prepared with the modular drill and keel punch for that size.      The femoral sizing guide is placed and size 3 is most appropriate. Rotation is marked off the epicondylar axis and confirmed by creating a rectangular flexion gap at 90 degrees. The size 3 cutting block is pinned in this rotation and the anterior, posterior and chamfer cuts are made with the oscillating saw. The intercondylar block is then placed and that cut is made.      Trial size 3 tibial component, trial size 3  posterior stabilized femur and a 15  mm posterior stabilized rotating platform insert trial is placed. Full extension is achieved with excellent varus/valgus and anterior/posterior balance throughout full range of motion. The patella is everted and thickness measured to be 24  mm. Free hand resection is taken to 14 mm, a 38 template is placed, lug holes are drilled, trial patella is placed, and it tracks normally.  Osteophytes are removed off the posterior femur with the trial in place. All trials are removed and the cut bone surfaces prepared with pulsatile lavage. Cement is mixed and once ready for implantation, the size 3 tibial implant, size  3 posterior stabilized femoral component, and the size 38 patella are cemented in place and the patella is held with the clamp. The trial insert is placed and the knee held in full extension. The Exparel (20 ml mixed with 30 ml saline) and .25% Bupivicaine, are injected into the extensor mechanism, posterior capsule, medial and lateral gutters and subcutaneous tissues.  All extruded cement is removed and once the cement is hard the permanent 15 mm posterior stabilized rotating platform insert is placed into the tibial tray.      The wound is copiously irrigated with saline solution and the extensor mechanism closed over a hemovac drain with #1 PDS suture. The tourniquet is released for a total tourniquet time of 40  minutes. Flexion against gravity is 140 degrees and the patella tracks normally. Subcutaneous tissue is closed with 2.0 vicryl and subcuticular with running 4.0 Monocryl. The incision is cleaned and dried and steri-strips and a bulky sterile dressing are applied. The limb is placed into a knee immobilizer and the patient is awakened and transported to recovery in stable condition.      Please note that a surgical assistant was a medical necessity for this procedure in order to perform it in a safe and expeditious manner. Surgical assistant was necessary to retract the ligaments and vital neurovascular structures to prevent injury to them and also necessary for proper positioning of the limb to allow for anatomic placement of the prosthesis.   Dione Plover [name], MD    08/03/2013, 8:22 AM

## 2013-08-03 NOTE — Anesthesia Postprocedure Evaluation (Signed)
  Anesthesia Post-op Note  Patient: Carrie Monroe  Procedure(s) Performed: Procedure(s) (LRB): TOTAL LEFT KNEE ARTHROPLASTY (Left)  Patient Location: PACU  Anesthesia Type: Spinal  Level of Consciousness: awake and alert   Airway and Oxygen Therapy: Patient Spontanous Breathing  Post-op Pain: mild  Post-op Assessment: Post-op Vital signs reviewed, Patient's Cardiovascular Status Stable, Respiratory Function Stable, Patent Airway and No signs of Nausea or vomiting  Last Vitals:  Filed Vitals:   08/03/13 1230  BP: 136/65  Pulse: 68  Temp: 36.5 C  Resp: 18    Post-op Vital Signs: stable   Complications: No apparent anesthesia complications

## 2013-08-03 NOTE — Evaluation (Signed)
Physical Therapy Evaluation Patient Details Name: Carrie Monroe MRN: [id number] DOB: 1935-12-04 Today's Date: 08/03/2013 Time: [id number] PT Time Calculation (min): 21 min  PT Assessment / Plan / Recommendation History of Present Illness  s/p L TKA  Clinical Impression  Pt will benefit from PT to address deficits below; plan is for SNF    PT Assessment  Patient needs continued PT services    Follow Up Recommendations  SNF    Does the patient have the potential to tolerate intense rehabilitation      Barriers to Discharge        Equipment Recommendations  None recommended by PT (wide RW if home)    Recommendations for Other Services     Frequency 7X/week    Precautions / Restrictions Precautions Precautions: Knee Required Braces or Orthoses: Knee Immobilizer - Left Knee Immobilizer - Left: Discontinue once straight leg raise with < 10 degree lag   Pertinent Vitals/Pain sats 89-93% on RA, O2 removed per RN Pain controlled      Mobility  Bed Mobility Overal bed mobility: +2 for physical assistance;Needs Assistance Bed Mobility: Supine to Sit Supine to sit: Min assist;+2 for physical assistance;+2 for safety/equipment General bed mobility comments: cues for tehchinque, HOB elevated Transfers Overall transfer level: Needs assistance Equipment used: Rolling walker (2 wheeled) Transfers: Sit to/from Stand Sit to Stand: +2 physical assistance;Min assist General transfer comment: cues for hand placement, wt shift and LLE position Ambulation/Gait Ambulation/Gait assistance: Min assist;+2 safety/equipment Ambulation Distance (Feet): 22 Feet Assistive device: Rolling walker (2 wheeled) Gait Pattern/deviations: Step-to pattern;Antalgic Gait velocity: decr General Gait Details: cues for sequence, RW position    Exercises Total Joint Exercises Ankle Circles/Pumps: AROM;Both;10 reps   PT Diagnosis: Difficulty walking  PT Problem List: Decreased strength;Decreased  range of motion;Decreased activity tolerance;Decreased balance;Decreased mobility;Decreased knowledge of use of DME;Decreased knowledge of precautions PT Treatment Interventions: DME instruction;Gait training;Functional mobility training;Therapeutic activities;Therapeutic exercise;Patient/family education     PT Goals(Current goals can be found in the care plan section) Acute Rehab PT Goals Patient Stated Goal: I after rehab PT Goal Formulation: With patient Time For Goal Achievement: 08/07/13 Potential to Achieve Goals: Good  Visit Information  Last PT Received On: 08/03/13 Assistance Needed: +2 (safety) History of Present Illness: s/p L TKA       Prior Functioning  Home Living Family/patient expects to be discharged to:: Skilled nursing facility Additional Comments: pt wanted to go to [facility] but they are not provider for her ins per pt Prior Function Level of Independence: Independent Communication Communication: No difficulties    Cognition  Cognition Arousal/Alertness: Awake/alert Behavior During Therapy: WFL for tasks assessed/performed Overall Cognitive Status: Within Functional Limits for tasks assessed    Extremity/Trunk Assessment Upper Extremity Assessment Upper Extremity Assessment: Defer to OT evaluation Lower Extremity Assessment Lower Extremity Assessment: LLE deficits/detail LLE Deficits / Details: ankle WFL, able to assist minimally with SLR--quads 2/5 LLE: Unable to fully assess due to pain   Balance    End of Session PT - End of Session Equipment Utilized During Treatment: Gait belt;Left knee immobilizer Activity Tolerance: Patient tolerated treatment well Patient left: in chair;with family/visitor present;with call bell/phone within reach Nurse Communication: Mobility status CPM Left Knee CPM Left Knee: Off  GP     [facility] 08/03/2013, 4:17 PM

## 2013-08-03 NOTE — Interval H&P Note (Signed)
History and Physical Interval Note:  08/03/2013 6:48 AM  Carrie Monroe  has presented today for surgery, with the diagnosis of OA LEFT KNEE   The various methods of treatment have been discussed with the patient and family. After consideration of risks, benefits and other options for treatment, the patient has consented to  Procedure(s): TOTAL LEFT KNEE ARTHROPLASTY (Left) as a surgical intervention .  [name] history has been reviewed, patient examined, no change in status, stable for surgery.  I have reviewed the patient's chart and labs.  Questions were answered to the patient's satisfaction.     Gearlean Alf

## 2013-08-03 NOTE — Anesthesia Preprocedure Evaluation (Addendum)
Anesthesia Evaluation  Patient identified by MRN, date of birth, ID band Patient awake    Reviewed: Allergy & Precautions, H&P , NPO status , Patient's Chart, lab work & pertinent test results  Airway Mallampati: II TM Distance: >3 FB Neck ROM: Full    Dental no notable dental hx.    Pulmonary shortness of breath,  breath sounds clear to auscultation  Pulmonary exam normal       Cardiovascular hypertension, Pt. on medications and Pt. on home beta blockers + Peripheral Vascular Disease and +CHF Rhythm:Regular Rate:Normal  Most recent cardiology evaluation in February reviewed. EF 60 %. Mild aortic stenosis. Diastolic dysfunction.   Neuro/Psych PSYCHIATRIC DISORDERS Anxiety Depression negative neurological ROS     GI/Hepatic negative GI ROS, Neg liver ROS,   Endo/Other  diabetesMorbid obesity  Renal/GU negative Renal ROS  negative genitourinary   Musculoskeletal negative musculoskeletal ROS (+)   Abdominal   Peds negative pediatric ROS (+)  Hematology negative hematology ROS (+)   Anesthesia Other Findings   Reproductive/Obstetrics negative OB ROS                         Anesthesia Physical Anesthesia Plan  ASA: III  Anesthesia Plan: Spinal   Post-op Pain Management:    Induction: Intravenous  Airway Management Planned:   Additional Equipment:   Intra-op Plan:   Post-operative Plan:   Informed Consent: I have reviewed the patients History and Physical, chart, labs and discussed the procedure including the risks, benefits and alternatives for the proposed anesthesia with the patient or authorized representative who has indicated his/her understanding and acceptance.   Dental advisory given  Plan Discussed with: CRNA  Anesthesia Plan Comments: (Discussed r/b general versus spinal. She prefers spinal. Discussed risks/benefits of spinal including headache, backache, failure, bleeding,  infection, and nerve damage. Patient consents to spinal. Questions answered. Coagulation studies and platelet count acceptable.She did not take her lasix yesterday or today and we will try to give her some intraop.)       Anesthesia Quick Evaluation

## 2013-08-04 DIAGNOSIS — I5043 Acute on chronic combined systolic (congestive) and diastolic (congestive) heart failure: Secondary | ICD-10-CM | POA: Insufficient documentation

## 2013-08-04 DIAGNOSIS — E871 Hypo-osmolality and hyponatremia: Secondary | ICD-10-CM | POA: Diagnosis not present

## 2013-08-04 DIAGNOSIS — E119 Type 2 diabetes mellitus without complications: Secondary | ICD-10-CM | POA: Diagnosis present

## 2013-08-04 DIAGNOSIS — D62 Acute posthemorrhagic anemia: Secondary | ICD-10-CM | POA: Diagnosis not present

## 2013-08-04 LAB — BASIC METABOLIC PANEL
BUN: 29 mg/dL — ABNORMAL HIGH (ref 6–23)
CALCIUM: 8.9 mg/dL (ref 8.4–10.5)
CHLORIDE: 101 meq/L (ref 96–112)
CO2: 23 meq/L (ref 19–32)
CREATININE: 1.06 mg/dL (ref 0.50–1.10)
GFR calc Af Amer: 58 mL/min — ABNORMAL LOW (ref >90)
GFR calc non Af Amer: 50 mL/min — ABNORMAL LOW (ref >90)
GLUCOSE: 253 mg/dL — AB (ref 70–99)
Potassium: 5 mEq/L (ref 3.7–5.3)
SODIUM: 136 meq/L — AB (ref 137–147)

## 2013-08-04 LAB — CBC
HCT: 28.3 % — ABNORMAL LOW (ref 36.0–46.0)
HEMOGLOBIN: 9.6 g/dL — AB (ref 12.0–15.0)
MCH: 31.6 pg (ref 26.0–34.0)
MCHC: 33.9 g/dL (ref 30.0–36.0)
MCV: 93.1 fL (ref 78.0–100.0)
PLATELETS: 134 10*3/uL — AB (ref 150–400)
RBC: 3.04 MIL/uL — ABNORMAL LOW (ref 3.87–5.11)
RDW: 13.7 % (ref 11.5–15.5)
WBC: 10.2 10*3/uL (ref 4.0–10.5)

## 2013-08-04 MED ORDER — POLYSACCHARIDE IRON COMPLEX 150 MG PO CAPS
150.0000 mg | ORAL_CAPSULE | Freq: Every day | ORAL | Status: DC
  Administered 2013-08-04 – 2013-08-05 (×2): 150 mg via ORAL
  Filled 2013-08-04 (×2): qty 1

## 2013-08-04 NOTE — Progress Notes (Signed)
Physical Therapy Treatment Patient Details Name: Carrie Monroe MRN: [id number] DOB: 1935/08/31 Today's Date: 08/04/2013 Time: [id number] PT Time Calculation (min): 24 min  PT Assessment / Plan / Recommendation  History of Present Illness s/p L TKA   PT Comments   Pt ambulated in room. Plans SNF.   Follow Up Recommendations  SNF     Does the patient have the potential to tolerate intense rehabilitation     Barriers to Discharge        Equipment Recommendations  None recommended by PT    Recommendations for Other Services    Frequency 7X/week   Progress towards PT Goals Progress towards PT goals: Progressing toward goals  Plan Current plan remains appropriate    Precautions / Restrictions     Pertinent Vitals/Pain L knee 4     Mobility  Bed Mobility Bed Mobility: Supine to Sit Supine to sit: Min assist Sit to supine: Min assist General bed mobility comments: cues for technique, support L leg off of/onto bed. Transfers Overall transfer level: Needs assistance Equipment used: Rolling walker (2 wheeled) Transfers: Sit to/from Stand Sit to Stand: Min assist;From elevated surface General transfer comment: cues for Hand placement to/ from [facility]. Ambulation/Gait Ambulation/Gait assistance: Min assist Ambulation Distance (Feet): 15 Feet (x2) Assistive device: Rolling walker (2 wheeled) Gait Pattern/deviations: Step-to pattern;Antalgic;Trunk flexed Gait velocity: decr General Gait Details: cues for sequence, RW position    Exercises     PT Diagnosis:    PT Problem List:   PT Treatment Interventions:     PT Goals (current goals can now be found in the care plan section)    Visit Information  Last PT Received On: 08/04/13 Assistance Needed: +1 History of Present Illness: s/p L TKA    Subjective Data      Cognition  Cognition Arousal/Alertness: Awake/alert    Balance     End of Session PT - End of Session Activity Tolerance: Patient tolerated treatment  well Patient left: in bed;with call bell/phone within reach Nurse Communication: Mobility status;Patient requests pain meds CPM Left Knee CPM Left Knee: Off   GP     Loanne, [place] 08/04/2013, 4:50 PM Tresa Endo PT [phone]

## 2013-08-04 NOTE — Progress Notes (Signed)
Inpatient Diabetes Program Recommendations  AACE/ADA: New Consensus Statement on Inpatient Glycemic Control (2013)  Target Ranges:  Prepandial:   less than 140 mg/dL      Peak postprandial:   less than 180 mg/dL (1-2 hours)      Critically ill patients:  140 - 180 mg/dL   Reason for Visit: Hyperglycemia  Diabetes history: Type 2 DM Outpatient Diabetes medications: None Current orders for Inpatient glycemic control: None  Results for DALARIE, LONGTIN (MRN [id number]) as of 08/04/2013 13:58  Ref. Range 08/03/2013 05:48 08/03/2013 08:59  Glucose-Capillary Latest Range: 70-99 mg/dL 182 (H) 210 (H)  Results for DYNETTA, BAYLIFF (MRN [id number]) as of 08/04/2013 13:58  Ref. Range 07/28/2013 14:00 08/04/2013 04:50  Glucose Latest Range: 70-99 mg/dl 208 (H) 253 (H)   Hyperglycemia likely d/t Decadron.  Consider addition of Novolog sensitive tidwc.  Thank you. Lorenda Peck, RD, LDN, CDE Inpatient Diabetes Coordinator [phone]

## 2013-08-04 NOTE — Progress Notes (Signed)
Clinical Social Work Department CLINICAL SOCIAL WORK PLACEMENT NOTE 08/04/2013  Patient:  Carrie Monroe, Carrie Monroe  Account Number:  [id number] Admit date:  08/03/2013  Clinical Social Worker:  Werner Lean, LCSW  Date/time:  08/04/2013 09:05 AM  Clinical Social Work is seeking post-discharge placement for this patient at the following level of care:   SKILLED NURSING   (*CSW will update this form in Epic as items are completed)   08/04/2013  Patient/family provided with [facility] of Clinical Social Work's list of facilities offering this level of care within the geographic area requested by the patient (or if unable, by the patient's family).  08/04/2013  Patient/family informed of their freedom to choose among providers that offer the needed level of care, that participate in Medicare, Medicaid or managed care program needed by the patient, have an available bed and are willing to accept the patient.    Patient/family informed of [facility]' ownership interest in [facility], as well as of the fact that they are under no obligation to receive care at this facility.  PASARR submitted to EDS on 08/04/2013 PASARR number received from EDS on 08/04/2013  FL2 transmitted to all facilities in geographic area requested by pt/family on  08/04/2013 FL2 transmitted to all facilities within larger geographic area on   Patient informed that his/her managed care company has contracts with or will negotiate with  certain facilities, including the following:     Patient/family informed of bed offers received:  08/04/2013 Patient chooses bed at [facility] recommends and patient chooses bed at    Patient to be transferred to [place] on   Patient to be transferred to facility by   The following physician request were entered in Epic:   Additional Comments:

## 2013-08-04 NOTE — Progress Notes (Signed)
Subjective: 1 Day Post-Op Procedure(s) (LRB): TOTAL LEFT KNEE ARTHROPLASTY (Left) Patient reports doing very well this morning.  She feels good today, much better than compared to her previous knee about 10 years ago on the other side. Patient seen in rounds with Dr. Wynelle Link. Patient is well, and has had no acute complaints or problems We will start therapy today.  Plan is to go SNF after hospital stay.  Will get the Social Worker to assist with placement.  Objective: Vital signs in last 24 hours: Temp:  [97.4 F (36.3 C)-98.3 F (36.8 C)] 97.4 F (36.3 C) (03/10 0613) Pulse Rate:  [61-73] 73 (03/10 0613) Resp:  [12-20] 18 (03/10 0613) BP: (108-145)/(43-122) 116/74 mmHg (03/10 0613) SpO2:  [92 %-100 %] 96 % (03/10 0613) Weight:  [123.3 kg (271 lb 13.2 oz)] 123.3 kg (271 lb 13.2 oz) (03/09 1300)  Intake/Output from previous day:  Intake/Output Summary (Last 24 hours) at 08/04/13 0819 Last data filed at 08/04/13 0700  Gross per 24 hour  Intake   2680 ml  Output   2875 ml  Net   -195 ml    Intake/Output this shift: UOP 600 since MN -195 at this time.  Labs:  Recent Labs  08/04/13 0450  HGB 9.6*    Recent Labs  08/04/13 0450  WBC 10.2  RBC 3.04*  HCT 28.3*  PLT 134*    Recent Labs  08/04/13 0450  NA 136*  K 5.0  CL 101  CO2 23  BUN 29*  CREATININE 1.06  GLUCOSE 253*  CALCIUM 8.9   No results found for this basename: LABPT, INR,  in the last 72 hours  EXAM General - Patient is Alert, Appropriate and Oriented Extremity - Neurovascular intact Sensation intact distally Dorsiflexion/Plantar flexion intact Dressing - dressing C/D/I Motor Function - intact, moving foot and toes well on exam.  Hemovac pulled without difficulty.  Past Medical History  Diagnosis Date  . Hypertension   . Hyperlipidemia   . Depression   . Anxiety   . Exogenous obesity   . Aortic stenosis   . Bilateral lower extremity edema   . CHF (congestive heart failure)   .  Heart murmur     hx mild aortic stenosis  . Carotid artery stenosis     bilateral  . Arthritis     oa left knee  . Shortness of breath     if she does not take her lasix and with exertion  . Chest pain on exertion   . Gout   . Diabetes mellitus     DIET CONTROL  . Cancer     left breast CANCER, LUMPECTOMY AND RADIATION  . Breast cancer   . Leg cramps     BILATERAL  . Decreased range of motion     RIGHT ARM - HX OF SHOULDER SURGERY AND LIMITED ABILTIY TO RAISE RT ARM ABOVE HEAD  . Hemorrhoids   . History of kidney stones   . Shingles     YRS AGO - NO RESIDUAL PROBLEMS    Assessment/Plan: 1 Day Post-Op Procedure(s) (LRB): TOTAL LEFT KNEE ARTHROPLASTY (Left) Principal Problem:   OA (osteoarthritis) of knee Active Problems:   Pure hypercholesterolemia - resumed Lipitor   Hyponatremia, Postop - reduce fluids this am and saline lock IV later today is taking POs well.   Postoperative anemia due to acute blood loss - add Iron   Diabetes mellitus type II, controlled - diet controlled, elevated glucose postop from surgical stress,  monitor. Diabetic diet. Past History of CHF - takes Lasix.  I&O's are balanced.  Continue lasix.  Estimated body mass index is 48.16 kg/(m^2) as calculated from the following:   Height as of this encounter: 5\' 3"  (1.6 m).   Weight as of this encounter: 123.3 kg (271 lb 13.2 oz). Advance diet Up with therapy Discharge to SNF  DVT Prophylaxis - Xarelto Weight-Bearing as tolerated to left leg D/C O2 and Pulse OX and try on Room [address]  Mickel Crow 08/04/2013, 8:19 AM

## 2013-08-04 NOTE — Progress Notes (Signed)
Physical Therapy Treatment Patient Details Name: Carrie Monroe MRN: [id number] DOB: 21-Mar-1936 Today's Date: 08/04/2013 Time: [id number] PT Time Calculation (min): 27 min  PT Assessment / Plan / Recommendation  History of Present Illness s/p L TKA   PT Comments   Progressing; may need O2 next amb  Follow Up Recommendations  SNF     Does the patient have the potential to tolerate intense rehabilitation     Barriers to Discharge        Equipment Recommendations  None recommended by PT    Recommendations for Other Services    Frequency 7X/week   Progress towards PT Goals Progress towards PT goals: Progressing toward goals  Plan Current plan remains appropriate    Precautions / Restrictions Precautions Precautions: Knee Knee Immobilizer - Left: Discontinue once straight leg raise with < 10 degree lag Restrictions Weight Bearing Restrictions: No Other Position/Activity Restrictions: WBAT   Pertinent Vitals/Pain sats 86% on RA after amb 97% with O2 replaced    Mobility  Bed Mobility Overal bed mobility: Needs Assistance Bed Mobility: Sit to Supine Supine to sit: Min assist Sit to supine: Min assist General bed mobility comments: cues for technique Transfers Overall transfer level: Needs assistance Equipment used: Rolling walker (2 wheeled) Transfers: Sit to/from [facility] Sit to Stand: Min assist;From elevated surface Stand pivot transfers: Min assist General transfer comment: cues for UE/LE position Ambulation/Gait Ambulation/Gait assistance: Min assist Ambulation Distance (Feet): 85 Feet Assistive device: Rolling walker (2 wheeled) Gait Pattern/deviations: Trunk flexed;Antalgic;Step-to pattern Gait velocity: decr General Gait Details: cues for sequence, RW position    Exercises Total Joint Exercises Ankle Circles/Pumps: AROM;Both;10 reps Quad Sets: AROM;Both;10 reps Short Arc Quad: AROM;AAROM;Left;10 reps Heel Slides:  AROM;AAROM;Left;10 reps Straight Leg Raises: AROM;AAROM;Left;10 reps Goniometric ROM: 85*   PT Diagnosis:    PT Problem List:   PT Treatment Interventions:     PT Goals (current goals can now be found in the care plan section) Acute Rehab PT Goals Patient Stated Goal: independent PT Goal Formulation: With patient Time For Goal Achievement: 08/07/13 Potential to Achieve Goals: Good  Visit Information  Last PT Received On: 08/04/13 Assistance Needed: +1 History of Present Illness: s/p L TKA    Subjective Data  Patient Stated Goal: independent   Cognition  Cognition Arousal/Alertness: Awake/alert Behavior During Therapy: WFL for tasks assessed/performed Overall Cognitive Status: Within Functional Limits for tasks assessed    Balance     End of Session PT - End of Session Equipment Utilized During Treatment: Gait belt;Left knee immobilizer Activity Tolerance: Patient tolerated treatment well Patient left: in bed;with call bell/phone within reach Nurse Communication: Mobility status   GP     [facility] 08/04/2013, 11:47 AM

## 2013-08-04 NOTE — Progress Notes (Signed)
Clinical Social Work Department BRIEF PSYCHOSOCIAL ASSESSMENT 08/04/2013  Patient:  Carrie Monroe, Carrie Monroe     Account Number:  [id number]     Admit date:  08/03/2013  Clinical Social Worker:  Lacie Scotts  Date/Time:  08/04/2013 08:53 AM  Referred by:  Physician  Date Referred:  08/04/2013 Referred for  SNF Placement   Other Referral:   Interview type:  Patient Other interview type:    PSYCHOSOCIAL DATA Living Status:  HUSBAND Admitted from facility:   Level of care:   Primary support name:  Staci Acosta Primary support relationship to patient:  SPOUSE Degree of support available:   supportive    CURRENT CONCERNS Current Concerns  Post-Acute Placement   Other Concerns:    SOCIAL WORK ASSESSMENT / PLAN Pt is a 77 yr old female living at home prior to hospitalization . CSW met with pt to assist with d/c planning. ST Rehab will be needed following hospital d/c. SNF search has been initiated and bed offers provided. Pt would like rehab at [facility]. SNF has been contacted and decision is pending. Pt has [employer]. CSW will request authorization for rehab prior to d/c.   Assessment/plan status:  Psychosocial Support/Ongoing Assessment of Needs Other assessment/ plan:   Information/referral to community resources:   SNF list with bed offers provided. Insurance coverage for SNF and ambulance transport reviewed.    PATIENT'S/FAMILY'S RESPONSE TO PLAN OF CARE: Pt had a good night following surgery. She is motivated to work with therapy and is looking forward to rehab placement, hopefully at [facility].    Werner Lean LCSW [phone]

## 2013-08-04 NOTE — Evaluation (Signed)
Occupational Therapy Evaluation Patient Details Name: Carrie Monroe MRN: [id number] DOB: 16-Apr-1936 Today's Date: 08/04/2013 Time: [id number] OT Time Calculation (min): 29 min  OT Assessment / Plan / Recommendation History of present illness s/p L TKA   Clinical Impression   Pt was admitted for the above surgery. She will benefit from skilled OT to increase safety and independence with adls.  Goals in acute are for supervision to min A level.  She was independent with adls prior to admission and assisted her husband.      OT Assessment  Patient needs continued OT Services    Follow Up Recommendations  SNF    Barriers to Discharge      Equipment Recommendations  3 in 1 bedside comode    Recommendations for Other Services    Frequency  Min 2X/week    Precautions / Restrictions Precautions Precautions: Knee Knee Immobilizer - Left: Discontinue once straight leg raise with < 10 degree lag Restrictions Weight Bearing Restrictions: No   Pertinent Vitals/Pain Uncomfortable in bed; min pain with weightbearing:  Repositioned with ice    ADL  Grooming: Set up;Wash/dry face Where Assessed - Grooming: Unsupported sitting Upper Body Bathing: Set up Where Assessed - Upper Body Bathing: Unsupported sitting Lower Body Bathing: Moderate assistance Where Assessed - Lower Body Bathing: Supported sit to stand Upper Body Dressing: Minimal assistance (iv) Where Assessed - Upper Body Dressing: Unsupported sitting Lower Body Dressing: Maximal assistance Where Assessed - Lower Body Dressing: Supported sit to stand Toilet Transfer: Minimal assistance Toilet Transfer Method: Stand pivot Toileting - Clothing Manipulation and Hygiene: Minimal assistance Where Assessed - Best boy and Hygiene: Sit to stand from 3-in-1 or toilet Equipment Used: Rolling walker;Sock aid;Reacher Transfers/Ambulation Related to ADLs: spt to chair.  Cues for sequence/safety ADL Comments:  Performed bathing from EOB and changed gown.  Educated on AE--pt used sock aid only for R foot    OT Diagnosis: Generalized weakness  OT Problem List: Decreased strength;Decreased activity tolerance;Decreased knowledge of use of DME or AE;Decreased knowledge of precautions;Pain OT Treatment Interventions: Self-care/ADL training;DME and/or AE instruction;Patient/family education   OT Goals(Current goals can be found in the care plan section) Acute Rehab OT Goals Patient Stated Goal: independent OT Goal Formulation: With patient Time For Goal Achievement: 08/11/13 Potential to Achieve Goals: Good ADL Goals Pt Will Perform Grooming: with supervision;standing Pt Will Perform Lower Body Bathing: with min assist;with adaptive equipment;sit to/from stand Pt Will Perform Lower Body Dressing: with min assist;with adaptive equipment;sit to/from stand Pt Will Transfer to Toilet: with min guard assist;ambulating;bedside commode Pt Will Perform Toileting - Clothing Manipulation and hygiene: with min guard assist;sit to/from stand  Visit Information  Last OT Received On: 08/04/13 Assistance Needed: +2 (+1 SPT) History of Present Illness: s/p L TKA       Prior Functioning     Home Living Family/patient expects to be discharged to:: Skilled nursing facility Additional Comments: husband uses RW all the time; has neuropathy and pt assists him Prior Function Level of Independence: Independent Communication Communication: No difficulties Dominant Hand: Right         Vision/Perception     Cognition  Cognition Arousal/Alertness: Awake/alert Behavior During Therapy: WFL for tasks assessed/performed Overall Cognitive Status: Within Functional Limits for tasks assessed    Extremity/Trunk Assessment Upper Extremity Assessment Upper Extremity Assessment: Generalized weakness (h/o RCR on R and bad RC on L.  Difficulty sustaining hands o)     Mobility Bed Mobility Supine to sit: Min  assist  General bed mobility comments: cues for technique Transfers Transfers: Sit to/from Stand Sit to Stand: Min assist;From elevated surface General transfer comment: cues for UE/LE position     Exercise     Balance     End of Session OT - End of Session Activity Tolerance: Patient tolerated treatment well Patient left: in chair;with call bell/phone within reach CPM Left Knee CPM Left Knee: Off  GO     Nguyen Todorov 08/04/2013, 10:23 AM Lesle Chris, OTR/L [phone] 08/04/2013

## 2013-08-04 NOTE — Discharge Instructions (Addendum)
° °Dr. Frank Aluisio °Total Joint Specialist °[facility] °[address] °[place], [place] [address] °[phone] ° °TOTAL KNEE REPLACEMENT POSTOPERATIVE DIRECTIONS ° ° ° °Knee Rehabilitation, Guidelines Following Surgery  °Results after knee surgery are often greatly improved when you follow the exercise, range of motion and muscle strengthening exercises prescribed by your doctor. Safety measures are also important to protect the knee from further injury. Any time any of these exercises cause you to have increased pain or swelling in your knee joint, decrease the amount until you are comfortable again and slowly increase them. If you have problems or questions, call your caregiver or physical therapist for advice.  ° °HOME CARE INSTRUCTIONS  °Remove items at home which could result in a fall. This includes throw rugs or furniture in walking pathways.  °Continue medications as instructed at time of discharge. °You may have some home medications which will be placed on hold until you complete the course of blood thinner medication.  °You may start showering once you are discharged home but do not submerge the incision under water. Just pat the incision dry and apply a dry gauze dressing on daily. °Walk with walker as instructed.  °You may resume a sexual relationship in one month or when given the OK by  your doctor.  °· Use walker as long as suggested by your caregivers. °· Avoid periods of inactivity such as sitting longer than an hour when not asleep. This helps prevent blood clots.  °You may put full weight on your legs and walk as much as is comfortable.  °You may return to work once you are cleared by your doctor.  °Do not drive a car for 6 weeks or until released by you surgeon.  °· Do not drive while taking narcotics.  °Wear the elastic stockings for three weeks following surgery during the day but you may remove then at night. °Make sure you keep all of your appointments after your  operation with all of your doctors and caregivers. You should call the office at the above phone number and make an appointment for approximately two weeks after the date of your surgery. °Change the dressing daily and reapply a dry dressing each time. °Please pick up a stool softener and laxative for home use as long as you are requiring pain medications. °· Continue to use ice on the knee for pain and swelling from surgery. You may notice swelling that will progress down to the foot and ankle.  This is normal after surgery.  Elevate the leg when you are not up walking on it.   °It is important for you to complete the blood thinner medication as prescribed by your doctor. °· Continue to use the breathing machine which will help keep your temperature down.  It is common for your temperature to cycle up and down following surgery, especially at night when you are not up moving around and exerting yourself.  The breathing machine keeps your lungs expanded and your temperature down. ° °RANGE OF MOTION AND STRENGTHENING EXERCISES  °Rehabilitation of the knee is important following a knee injury or an operation. After just a few days of immobilization, the muscles of the thigh which control the knee become weakened and shrink (atrophy). Knee exercises are designed to build up the tone and strength of the thigh muscles and to improve knee motion. Often times heat used for twenty to thirty minutes before working out will loosen up your tissues and help with improving the   range of motion but do not use heat for the first two weeks following surgery. These exercises can be done on a training (exercise) mat, on the floor, on a table or on a bed. Use what ever works the best and is most comfortable for you Knee exercises include:  Leg Lifts - While your knee is still immobilized in a splint or cast, you can do straight leg raises. Lift the leg to 60 degrees, hold for 3 sec, and slowly lower the leg. Repeat 10-20 times 2-3  times daily. Perform this exercise against resistance later as your knee gets better.  Quad and Hamstring Sets - Tighten up the muscle on the front of the thigh (Quad) and hold for 5-10 sec. Repeat this 10-20 times hourly. Hamstring sets are done by pushing the foot backward against an object and holding for 5-10 sec. Repeat as with quad sets.  A rehabilitation program following serious knee injuries can speed recovery and prevent re-injury in the future due to weakened muscles. Contact your doctor or a physical therapist for more information on knee rehabilitation.   SKILLED REHAB INSTRUCTIONS: If the patient is transferred to a skilled rehab facility following release from the hospital, a list of the current medications will be sent to the facility for the patient to continue.  When discharged from the skilled rehab facility, please have the facility set up the patient's [place] prior to being released. Also, the skilled facility will be responsible for providing the patient with their medications at time of release from the facility to include their pain medication, the muscle relaxants, and their blood thinner medication. If the patient is still at the rehab facility at time of the two week follow up appointment, the skilled rehab facility will also need to assist the patient in arranging follow up appointment in our office and any transportation needs.  MAKE SURE YOU:  Understand these instructions.  Will watch your condition.  Will get help right away if you are not doing well or get worse.    Pick up stool softner and laxative for home. Do not submerge incision under water. May shower. Continue to use ice for pain and swelling from surgery.   Take Xarelto for two and a half more weeks, then discontinue Xarelto. Once the patient has completed the Xarelto, they may resume the 81 mg Aspirin.  When discharged from the skilled rehab facility, please have the facility set  up the patient's [place] prior to being released.  Also provide the patient with their medications at time of release from the facility to include their pain medication, the muscle relaxants, and their blood thinner medication.  If the patient is still at the rehab facility at time of follow up appointment, please also assist the patient in arranging follow up appointment in our office and any transportation needs.     Information on my medicine - XARELTO (Rivaroxaban)  This medication education was reviewed with me or my healthcare representative as part of my discharge preparation.  The pharmacist that spoke with me during my hospital stay was:  Absher, Julieta Bellini, RPH  Why was Xarelto prescribed for you? Xarelto was prescribed for you to reduce the risk of blood clots forming after orthopedic surgery. The medical term for these abnormal blood clots is venous thromboembolism (VTE).  What do you need to know about xarelto ? Take your Xarelto ONCE DAILY at the same time every day. You may take it  either with or without food.  If you have difficulty swallowing the tablet whole, you may crush it and mix in applesauce just prior to taking your dose.  Take Xarelto exactly as prescribed by your doctor and DO NOT stop taking Xarelto without talking to the doctor who prescribed the medication.  Stopping without other VTE prevention medication to take the place of Xarelto may increase your risk of developing a clot.  After discharge, you should have regular check-up appointments with your healthcare provider that is prescribing your Xarelto.    What do you do if you miss a dose? If you miss a dose, take it as soon as you remember on the same day then continue your regularly scheduled once daily regimen the next day. Do not take two doses of Xarelto on the same day.   Important Safety Information A possible side effect of Xarelto is bleeding. You should call your  healthcare provider right away if you experience any of the following:   Bleeding from an injury or your nose that does not stop.   Unusual colored urine (red or dark brown) or unusual colored stools (red or black).   Unusual bruising for unknown reasons.   A serious fall or if you hit your head (even if there is no bleeding).  Some medicines may interact with Xarelto and might increase your risk of bleeding while on Xarelto. To help avoid this, consult your healthcare provider or pharmacist prior to using any new prescription or non-prescription medications, including herbals, vitamins, non-steroidal anti-inflammatory drugs (NSAIDs) and supplements.  This website has more information on Xarelto: [email or web address]

## 2013-08-05 ENCOUNTER — Other Ambulatory Visit: Payer: Self-pay | Admitting: *Deleted

## 2013-08-05 ENCOUNTER — Encounter: Payer: Self-pay | Admitting: *Deleted

## 2013-08-05 LAB — CBC
HCT: 28.6 % — ABNORMAL LOW (ref 36.0–46.0)
Hemoglobin: 9.3 g/dL — ABNORMAL LOW (ref 12.0–15.0)
MCH: 31 pg (ref 26.0–34.0)
MCHC: 32.5 g/dL (ref 30.0–36.0)
MCV: 95.3 fL (ref 78.0–100.0)
Platelets: 132 10*3/uL — ABNORMAL LOW (ref 150–400)
RBC: 3 MIL/uL — AB (ref 3.87–5.11)
RDW: 14 % (ref 11.5–15.5)
WBC: 10.3 10*3/uL (ref 4.0–10.5)

## 2013-08-05 LAB — BASIC METABOLIC PANEL
BUN: 26 mg/dL — ABNORMAL HIGH (ref 6–23)
CALCIUM: 9.2 mg/dL (ref 8.4–10.5)
CO2: 23 mEq/L (ref 19–32)
CREATININE: 0.9 mg/dL (ref 0.50–1.10)
Chloride: 102 mEq/L (ref 96–112)
GFR calc Af Amer: 71 mL/min — ABNORMAL LOW (ref >90)
GFR calc non Af Amer: 61 mL/min — ABNORMAL LOW (ref >90)
GLUCOSE: 259 mg/dL — AB (ref 70–99)
Potassium: 5.1 mEq/L (ref 3.7–5.3)
Sodium: 136 mEq/L — ABNORMAL LOW (ref 137–147)

## 2013-08-05 MED ORDER — ACETAMINOPHEN 325 MG PO TABS: 650.0000 mg | ORAL_TABLET | Freq: Four times a day (QID) | ORAL | Status: DC | PRN

## 2013-08-05 MED ORDER — METOCLOPRAMIDE HCL 5 MG PO TABS: 5.0000 mg | ORAL_TABLET | Freq: Three times a day (TID) | ORAL | Status: DC | PRN

## 2013-08-05 MED ORDER — RIVAROXABAN 10 MG PO TABS: 10.0000 mg | ORAL_TABLET | Freq: Every day | ORAL | Status: DC

## 2013-08-05 MED ORDER — TRAMADOL HCL 50 MG PO TABS: 50.0000 mg | ORAL_TABLET | Freq: Four times a day (QID) | ORAL | Status: DC | PRN

## 2013-08-05 MED ORDER — TRAMADOL HCL 50 MG PO TABS: ORAL_TABLET | ORAL | Status: DC

## 2013-08-05 MED ORDER — POLYETHYLENE GLYCOL 3350 17 G PO PACK: 17.0000 g | PACK | Freq: Every day | ORAL | Status: DC | PRN

## 2013-08-05 MED ORDER — POLYSACCHARIDE IRON COMPLEX 150 MG PO CAPS: 150.0000 mg | ORAL_CAPSULE | Freq: Every day | ORAL | Status: DC

## 2013-08-05 MED ORDER — METHOCARBAMOL 500 MG PO TABS: 500.0000 mg | ORAL_TABLET | Freq: Four times a day (QID) | ORAL | Status: DC | PRN

## 2013-08-05 MED ORDER — BISACODYL 10 MG RE SUPP: 10.0000 mg | Freq: Every day | RECTAL | Status: DC | PRN

## 2013-08-05 MED ORDER — OXYCODONE HCL 5 MG PO TABS: 5.0000 mg | ORAL_TABLET | ORAL | Status: DC | PRN

## 2013-08-05 MED ORDER — DSS 100 MG PO CAPS: 100.0000 mg | ORAL_CAPSULE | Freq: Two times a day (BID) | ORAL | Status: DC

## 2013-08-05 MED ORDER — ONDANSETRON HCL 4 MG PO TABS: 4.0000 mg | ORAL_TABLET | Freq: Four times a day (QID) | ORAL | Status: DC | PRN

## 2013-08-05 NOTE — Telephone Encounter (Signed)
[place] [facility]

## 2013-08-05 NOTE — Discharge Summary (Signed)
Physician Discharge Summary   Patient ID: Carrie Monroe MRN: [id number] DOB/AGE: 1935/07/06 78 y.o.  Admit date: 08/03/2013 Discharge date: 08-05-2013  Primary Diagnosis:  Osteoarthritis Left knee(s)  Admission Diagnoses:  Past Medical History  Diagnosis Date  . Hypertension   . Hyperlipidemia   . Depression   . Anxiety   . Exogenous obesity   . Aortic stenosis   . Bilateral lower extremity edema   . CHF (congestive heart failure)   . Heart murmur     hx mild aortic stenosis  . Carotid artery stenosis     bilateral  . Arthritis     oa left knee  . Shortness of breath     if she does not take her lasix and with exertion  . Chest pain on exertion   . Gout   . Diabetes mellitus     DIET CONTROL  . Cancer     left breast CANCER, LUMPECTOMY AND RADIATION  . Breast cancer   . Leg cramps     BILATERAL  . Decreased range of motion     RIGHT ARM - HX OF SHOULDER SURGERY AND LIMITED ABILTIY TO RAISE RT ARM ABOVE HEAD  . Hemorrhoids   . History of kidney stones   . Shingles     YRS AGO - NO RESIDUAL PROBLEMS   Discharge Diagnoses:   Principal Problem:   OA (osteoarthritis) of knee Active Problems:   Pure hypercholesterolemia   Hyponatremia   Postoperative anemia due to acute blood loss   Diabetes mellitus type II, controlled  Estimated body mass index is 48.16 kg/(m^2) as calculated from the following:   Height as of this encounter: 5' 3"  (1.6 m).   Weight as of this encounter: 123.3 kg (271 lb 13.2 oz).  Procedure:  Procedure(s) (LRB): TOTAL LEFT KNEE ARTHROPLASTY (Left)   Consults: None  HPI: Carrie Monroe is a 78 y.o. year old female with end stage OA of her left knee with progressively worsening pain and dysfunction. She has constant pain, with activity and at rest and significant functional deficits with difficulties even with ADLs. She has had extensive non-op management including analgesics, injections of cortisone and viscosupplements, and home  exercise program, but remains in significant pain with significant dysfunction. Radiographs show bone on bone arthritis medial and patellofemoral. She presents now for left Total Knee Arthroplasty.   Laboratory Data: Admission on 08/03/2013  Component Date Value Ref Range Status  . Glucose-Capillary 08/03/2013 182* 70 - 99 mg/dL Final  . Comment 1 08/03/2013 Documented in Chart   Final  . Glucose-Capillary 08/03/2013 210* 70 - 99 mg/dL Final  . WBC 08/04/2013 10.2  4.0 - 10.5 K/uL Final  . RBC 08/04/2013 3.04* 3.87 - 5.11 MIL/uL Final  . Hemoglobin 08/04/2013 9.6* 12.0 - 15.0 g/dL Final  . HCT 08/04/2013 28.3* 36.0 - 46.0 % Final  . MCV 08/04/2013 93.1  78.0 - 100.0 fL Final  . MCH 08/04/2013 31.6  26.0 - 34.0 pg Final  . MCHC 08/04/2013 33.9  30.0 - 36.0 g/dL Final  . RDW 08/04/2013 13.7  11.5 - 15.5 % Final  . Platelets 08/04/2013 134* 150 - 400 K/uL Final  . Sodium 08/04/2013 136* 137 - 147 mEq/L Final  . Potassium 08/04/2013 5.0  3.7 - 5.3 mEq/L Final  . Chloride 08/04/2013 101  96 - 112 mEq/L Final  . CO2 08/04/2013 23  19 - 32 mEq/L Final  . Glucose, Bld 08/04/2013 253* 70 - 99 mg/dL Final  .  BUN 08/04/2013 29* 6 - 23 mg/dL Final  . Creatinine, Ser 08/04/2013 1.06  0.50 - 1.10 mg/dL Final  . Calcium 08/04/2013 8.9  8.4 - 10.5 mg/dL Final  . GFR calc non Af Amer 08/04/2013 50* >90 mL/min Final  . GFR calc Af Amer 08/04/2013 58* >90 mL/min Final   Comment: (NOTE)                          The eGFR has been calculated using the CKD EPI equation.                          This calculation has not been validated in all clinical situations.                          eGFR's persistently <90 mL/min signify possible Chronic Kidney                          Disease.  . WBC 08/05/2013 10.3  4.0 - 10.5 K/uL Final  . RBC 08/05/2013 3.00* 3.87 - 5.11 MIL/uL Final  . Hemoglobin 08/05/2013 9.3* 12.0 - 15.0 g/dL Final  . HCT 08/05/2013 28.6* 36.0 - 46.0 % Final  . MCV 08/05/2013 95.3  78.0 -  100.0 fL Final  . MCH 08/05/2013 31.0  26.0 - 34.0 pg Final  . MCHC 08/05/2013 32.5  30.0 - 36.0 g/dL Final  . RDW 08/05/2013 14.0  11.5 - 15.5 % Final  . Platelets 08/05/2013 132* 150 - 400 K/uL Final  . Sodium 08/05/2013 136* 137 - 147 mEq/L Final  . Potassium 08/05/2013 5.1  3.7 - 5.3 mEq/L Final  . Chloride 08/05/2013 102  96 - 112 mEq/L Final  . CO2 08/05/2013 23  19 - 32 mEq/L Final  . Glucose, Bld 08/05/2013 259* 70 - 99 mg/dL Final  . BUN 08/05/2013 26* 6 - 23 mg/dL Final  . Creatinine, Ser 08/05/2013 0.90  0.50 - 1.10 mg/dL Final  . Calcium 08/05/2013 9.2  8.4 - 10.5 mg/dL Final  . GFR calc non Af Amer 08/05/2013 61* >90 mL/min Final  . GFR calc Af Amer 08/05/2013 71* >90 mL/min Final   Comment: (NOTE)                          The eGFR has been calculated using the CKD EPI equation.                          This calculation has not been validated in all clinical situations.                          eGFR's persistently <90 mL/min signify possible Chronic Kidney                          Disease.  Hospital Outpatient Visit on 07/28/2013  Component Date Value Ref Range Status  . MRSA, PCR 07/28/2013 NEGATIVE  NEGATIVE Final  . Staphylococcus aureus 07/28/2013 NEGATIVE  NEGATIVE Final   Comment:                                 The Xpert SA Assay (FDA  approved for NASAL specimens                          in patients over 34 years of age),                          is one component of                          a comprehensive surveillance                          program.  Test performance has                          been validated by [employer] for patients greater                          than or equal to 51 year old.                          It is not intended                          to diagnose infection nor to                          guide or monitor treatment.  Marland Kitchen aPTT 07/28/2013 26  24 - 37 seconds Final  . WBC  07/28/2013 6.9  4.0 - 10.5 K/uL Final  . RBC 07/28/2013 3.92  3.87 - 5.11 MIL/uL Final  . Hemoglobin 07/28/2013 12.2  12.0 - 15.0 g/dL Final  . HCT 07/28/2013 37.4  36.0 - 46.0 % Final  . MCV 07/28/2013 95.4  78.0 - 100.0 fL Final  . MCH 07/28/2013 31.1  26.0 - 34.0 pg Final  . MCHC 07/28/2013 32.6  30.0 - 36.0 g/dL Final  . RDW 07/28/2013 14.0  11.5 - 15.5 % Final  . Platelets 07/28/2013 176  150 - 400 K/uL Final  . Sodium 07/28/2013 138  137 - 147 mEq/L Final  . Potassium 07/28/2013 4.9  3.7 - 5.3 mEq/L Final  . Chloride 07/28/2013 99  96 - 112 mEq/L Final  . CO2 07/28/2013 27  19 - 32 mEq/L Final  . Glucose, Bld 07/28/2013 208* 70 - 99 mg/dL Final  . BUN 07/28/2013 28* 6 - 23 mg/dL Final  . Creatinine, Ser 07/28/2013 1.15* 0.50 - 1.10 mg/dL Final  . Calcium 07/28/2013 9.9  8.4 - 10.5 mg/dL Final  . Total Protein 07/28/2013 7.2  6.0 - 8.3 g/dL Final  . Albumin 07/28/2013 3.7  3.5 - 5.2 g/dL Final  . AST 07/28/2013 27  0 - 37 U/L Final  . ALT 07/28/2013 25  0 - 35 U/L Final  . Alkaline Phosphatase 07/28/2013 91  39 - 117 U/L Final  . Total Bilirubin 07/28/2013 0.5  0.3 - 1.2 mg/dL Final  . GFR calc non Af Amer 07/28/2013 45* >90 mL/min Final  . GFR calc Af Amer 07/28/2013 53* >90 mL/min Final   Comment: (NOTE)  The eGFR has been calculated using the CKD EPI equation.                          This calculation has not been validated in all clinical situations.                          eGFR's persistently <90 mL/min signify possible Chronic Kidney                          Disease.  Marland Kitchen Prothrombin Time 07/28/2013 13.4  11.6 - 15.2 seconds Final  . INR 07/28/2013 1.04  0.00 - 1.49 Final  . ABO/RH(D) 07/28/2013 A POS   Final  . Antibody Screen 07/28/2013 NEG   Final  . Sample Expiration 07/28/2013 08/06/2013   Final  . Color, Urine 07/28/2013 YELLOW  YELLOW Final  . APPearance 07/28/2013 CLEAR  CLEAR Final  . Specific Gravity, Urine 07/28/2013 1.019  1.005 -  1.030 Final  . pH 07/28/2013 7.0  5.0 - 8.0 Final  . Glucose, UA 07/28/2013 NEGATIVE  NEGATIVE mg/dL Final  . Hgb urine dipstick 07/28/2013 NEGATIVE  NEGATIVE Final  . Bilirubin Urine 07/28/2013 NEGATIVE  NEGATIVE Final  . Ketones, ur 07/28/2013 NEGATIVE  NEGATIVE mg/dL Final  . Protein, ur 07/28/2013 NEGATIVE  NEGATIVE mg/dL Final  . Urobilinogen, UA 07/28/2013 1.0  0.0 - 1.0 mg/dL Final  . Nitrite 07/28/2013 NEGATIVE  NEGATIVE Final  . Leukocytes, UA 07/28/2013 TRACE* NEGATIVE Final  . Squamous Epithelial / LPF 07/28/2013 FEW* RARE Final  . WBC, UA 07/28/2013 3-6  <3 WBC/hpf Final  . RBC / HPF 07/28/2013 0-2  <3 RBC/hpf Final  . Bacteria, UA 07/28/2013 FEW* RARE Final  . Urine-Other 07/28/2013 FEW YEAST   Final  . ABO/RH(D) 07/28/2013 A POS   Final  Office Visit on 06/22/2013  Component Date Value Ref Range Status  . Sodium 06/22/2013 144  135 - 145 mEq/L Final  . Potassium 06/22/2013 3.9  3.5 - 5.1 mEq/L Final  . Chloride 06/22/2013 112  96 - 112 mEq/L Final  . CO2 06/22/2013 25  19 - 32 mEq/L Final  . Glucose, Bld 06/22/2013 244* 70 - 99 mg/dL Final  . BUN 06/22/2013 43* 6 - 23 mg/dL Final  . Creatinine, Ser 06/22/2013 1.3* 0.4 - 1.2 mg/dL Final  . Calcium 06/22/2013 9.9  8.4 - 10.5 mg/dL Final  . GFR 06/22/2013 43.56* >60.00 mL/min Final  . Total Bilirubin 06/22/2013 0.9  0.3 - 1.2 mg/dL Final  . Bilirubin, Direct 06/22/2013 0.1  0.0 - 0.3 mg/dL Final  . Alkaline Phosphatase 06/22/2013 75  39 - 117 U/L Final  . AST 06/22/2013 24  0 - 37 U/L Final  . ALT 06/22/2013 30  0 - 35 U/L Final  . Total Protein 06/22/2013 7.0  6.0 - 8.3 g/dL Final  . Albumin 06/22/2013 3.6  3.5 - 5.2 g/dL Final  . Uric Acid, Serum 06/22/2013 10.0* 2.4 - 7.0 mg/dL Final     X-Rays:No results found.  EKG: Orders placed in visit on 06/22/13  . EKG 12-LEAD     Hospital Course: Carrie Monroe is a 78 y.o. who was admitted to [facility]. They were brought to the operating room on  08/03/2013 and underwent Procedure(s): TOTAL LEFT KNEE ARTHROPLASTY.  Patient tolerated the procedure well and was later transferred to the recovery room and then to  the orthopaedic floor for postoperative care.  They were given PO and IV analgesics for pain control following their surgery.  They were given 24 hours of postoperative antibiotics of  Anti-infectives   Start     Dose/Rate Route Frequency Ordered Stop   08/03/13 1400  ceFAZolin (ANCEF) IVPB 2 g/50 mL premix     2 g 100 mL/hr over 30 Minutes Intravenous Every 6 hours 08/03/13 1048 08/03/13 2029   08/03/13 0600  ceFAZolin (ANCEF) 3 g in dextrose 5 % 50 mL IVPB     3 g 160 mL/hr over 30 Minutes Intravenous On call to O.R. 08/02/13 1348 08/03/13 2025     and started on DVT prophylaxis in the form of Xarelto.   PT and OT were ordered for total joint protocol.  Discharge planning consulted to help with postop disposition and equipment needs.  Patient had a very good night on the evening of surgery.  They started to get up OOB with therapy on day one. Hemovac drain was pulled without difficulty.  Continued to work with therapy into day two.  Dressing was changed on day two and the incision was healing well.  Patient was seen in rounds and it was determined that the patinet's insurnace would allow transfer after two day stay was she was ready to go to the SNF [facility].   Discharge Medications: Prior to Admission medications   Medication Sig Start Date End Date Taking? Authorizing Provider  allopurinol (ZYLOPRIM) 100 MG tablet Take 100 mg by mouth 2 (two) times daily. 06/24/13  Yes Darlin Coco, MD  atorvastatin (LIPITOR) 40 MG tablet Take 40 mg by mouth at bedtime.    Yes Historical Provider, MD  bisoprolol-hydrochlorothiazide (ZIAC) 10-6.25 MG per tablet Take 1 tablet by mouth every morning.   Yes Historical Provider, MD  furosemide (LASIX) 40 MG tablet Take 40 mg by mouth daily. EVERY OTHER DAY OR EVERY TWO DAYS   Yes Historical  Provider, MD  lisinopril (PRINIVIL,ZESTRIL) 20 MG tablet Take 20 mg by mouth every morning.  01/23/11  Yes Historical Provider, MD  potassium chloride SA (K-DUR,KLOR-CON) 20 MEQ tablet Take 20 mEq by mouth daily.   Yes Historical Provider, MD  tamoxifen (NOLVADEX) 20 MG tablet Take 20 mg by mouth every morning. 11/04/12  Yes Deatra Robinson, MD  acetaminophen (TYLENOL) 325 MG tablet Take 2 tablets (650 mg total) by mouth every 6 (six) hours as needed for mild pain (or Fever >/= 101). 08/05/13   Alexzandrew Dara Lords, PA-C  bisacodyl (DULCOLAX) 10 MG suppository Place 1 suppository (10 mg total) rectally daily as needed for moderate constipation. 08/05/13   Alexzandrew Perkins, PA-C  docusate sodium 100 MG CAPS Take 100 mg by mouth 2 (two) times daily. 08/05/13   Alexzandrew Dara Lords, PA-C  iron polysaccharides (NIFEREX) 150 MG capsule Take 1 capsule (150 mg total) by mouth daily. 08/05/13   Alexzandrew Dara Lords, PA-C  methocarbamol (ROBAXIN) 500 MG tablet Take 1 tablet (500 mg total) by mouth every 6 (six) hours as needed for muscle spasms. 08/05/13   Alexzandrew Dara Lords, PA-C  metoCLOPramide (REGLAN) 5 MG tablet Take 1-2 tablets (5-10 mg total) by mouth every 8 (eight) hours as needed for nausea (if ondansetron (ZOFRAN) ineffective.). 08/05/13   Alexzandrew Perkins, PA-C  ondansetron (ZOFRAN) 4 MG tablet Take 1 tablet (4 mg total) by mouth every 6 (six) hours as needed for nausea. 08/05/13   Alexzandrew Perkins, PA-C  oxyCODONE (OXY IR/ROXICODONE) 5 MG immediate release tablet Take 1-2  tablets (5-10 mg total) by mouth every 3 (three) hours as needed for moderate pain, severe pain or breakthrough pain. 08/05/13   Alexzandrew Perkins, PA-C  polyethylene glycol (MIRALAX / GLYCOLAX) packet Take 17 g by mouth daily as needed for mild constipation. 08/05/13   Alexzandrew Dara Lords, PA-C  rivaroxaban (XARELTO) 10 MG TABS tablet Take 1 tablet (10 mg total) by mouth daily with breakfast. Take Xarelto for two and a half more  weeks, then discontinue Xarelto. Once the patient has completed the Xarelto, they may resume the 81 mg Aspirin. 08/05/13   Alexzandrew Perkins, PA-C  traMADol (ULTRAM) 50 MG tablet Take 1 tablet (50 mg total) by mouth every 6 (six) hours as needed for moderate pain. 08/05/13   Alexzandrew Dara Lords, PA-C    Discharge to SNF  Diet - Cardiac diet and Diabetic diet  Follow up - in 2 weeks  Activity - WBAT  Disposition - Skilled nursing facility  Condition Upon Discharge - Good  D/C Meds - See DC Summary  DVT Prophylaxis - Xarelto       Discharge Orders   Future Appointments Provider Department Dept Phone   09/07/2013 12:00 PM [facility] [phone]   09/07/2013 12:30 PM Deatra Robinson, MD [facility] [phone]   09/30/2013 9:05 AM [facility] [phone]   09/30/2013 2:15 PM Darlin Coco, MD [place] [facility] [phone]   Future Orders Complete By Expires   Call MD / Call 911  As directed    Comments:     If you experience chest pain or shortness of breath, CALL 911 and be transported to the hospital emergency room.  If you develope a fever above 101 F, pus (white drainage) or increased drainage or redness at the wound, or calf pain, call your surgeon's office.   Change dressing  As directed    Comments:     Change dressing daily with sterile 4 x 4 inch gauze dressing and apply TED hose. Do not submerge the incision under water.   Constipation Prevention  As directed    Comments:     Drink plenty of fluids.  Prune juice may be helpful.  You may use a stool softener, such as Colace (over the counter) 100 mg twice a day.  Use MiraLax (over the counter) for constipation as needed.   Diet - low sodium heart healthy  As directed    Discharge instructions  As directed    Comments:     Pick up stool softner and laxative for home. Do not submerge incision under  water. May shower. Continue to use ice for pain and swelling from surgery.  Take Xarelto for two and a half more weeks, then discontinue Xarelto. Once the patient has completed the Xarelto, they may resume the 81 mg Aspirin.  When discharged from the skilled rehab facility, please have the facility set up the patient's [place] prior to being released.  Also provide the patient with their medications at time of release from the facility to include their pain medication, the muscle relaxants, and their blood thinner medication.  If the patient is still at the rehab facility at time of follow up appointment, please also assist the patient in arranging follow up appointment in our office and any transportation needs.   Do not put a pillow under the knee. Place it under the heel.  As directed  Do not sit on low chairs, stoools or toilet seats, as it may be difficult to get up from low surfaces  As directed    Driving restrictions  As directed    Comments:     No driving until released by the physician.   Increase activity slowly as tolerated  As directed    Lifting restrictions  As directed    Comments:     No lifting until released by the physician.   Patient may shower  As directed    Comments:     You may shower without a dressing once there is no drainage.  Do not wash over the wound.  If drainage remains, do not shower until drainage stops.   TED hose  As directed    Comments:     Use stockings (TED hose) for 3 weeks on both leg(s).  You may remove them at night for sleeping.   Weight bearing as tolerated  As directed    Questions:     Laterality:     Extremity:         Medication List    STOP taking these medications       aspirin EC 81 MG tablet     naproxen sodium 220 MG tablet  Commonly known as:  ANAPROX     VITAMIN D-3 PO     vitamin E 1000 UNIT capsule      TAKE these medications       acetaminophen 325 MG tablet  Commonly known as:   TYLENOL  Take 2 tablets (650 mg total) by mouth every 6 (six) hours as needed for mild pain (or Fever >/= 101).     allopurinol 100 MG tablet  Commonly known as:  ZYLOPRIM  Take 100 mg by mouth 2 (two) times daily.     atorvastatin 40 MG tablet  Commonly known as:  LIPITOR  Take 40 mg by mouth at bedtime.     bisacodyl 10 MG suppository  Commonly known as:  DULCOLAX  Place 1 suppository (10 mg total) rectally daily as needed for moderate constipation.     bisoprolol-hydrochlorothiazide 10-6.25 MG per tablet  Commonly known as:  ZIAC  Take 1 tablet by mouth every morning.     DSS 100 MG Caps  Take 100 mg by mouth 2 (two) times daily.     furosemide 40 MG tablet  Commonly known as:  LASIX  Take 40 mg by mouth daily. EVERY OTHER DAY OR EVERY TWO DAYS     iron polysaccharides 150 MG capsule  Commonly known as:  NIFEREX  Take 1 capsule (150 mg total) by mouth daily.     lisinopril 20 MG tablet  Commonly known as:  PRINIVIL,ZESTRIL  Take 20 mg by mouth every morning.     methocarbamol 500 MG tablet  Commonly known as:  ROBAXIN  Take 1 tablet (500 mg total) by mouth every 6 (six) hours as needed for muscle spasms.     metoCLOPramide 5 MG tablet  Commonly known as:  REGLAN  Take 1-2 tablets (5-10 mg total) by mouth every 8 (eight) hours as needed for nausea (if ondansetron (ZOFRAN) ineffective.).     ondansetron 4 MG tablet  Commonly known as:  ZOFRAN  Take 1 tablet (4 mg total) by mouth every 6 (six) hours as needed for nausea.     oxyCODONE 5 MG immediate release tablet  Commonly known as:  Oxy IR/ROXICODONE  Take 1-2 tablets (5-10 mg total) by  mouth every 3 (three) hours as needed for moderate pain, severe pain or breakthrough pain.     polyethylene glycol packet  Commonly known as:  MIRALAX / GLYCOLAX  Take 17 g by mouth daily as needed for mild constipation.     potassium chloride SA 20 MEQ tablet  Commonly known as:  K-DUR,KLOR-CON  Take 20 mEq by mouth daily.      rivaroxaban 10 MG Tabs tablet  Commonly known as:  XARELTO  - Take 1 tablet (10 mg total) by mouth daily with breakfast. Take Xarelto for two and a half more weeks, then discontinue Xarelto.  - Once the patient has completed the Xarelto, they may resume the 81 mg Aspirin.     tamoxifen 20 MG tablet  Commonly known as:  NOLVADEX  Take 20 mg by mouth every morning.     traMADol 50 MG tablet  Commonly known as:  ULTRAM  Take 1 tablet (50 mg total) by mouth every 6 (six) hours as needed for moderate pain.       Follow-up Information   Follow up with Gearlean Alf, MD. Schedule an appointment as soon as possible for a visit in 2 weeks.   Specialty:  Orthopedic Surgery   Contact information:   [address] [place] [address] [phone]       Signed: Mickel Crow 08/05/2013, 8:14 AM

## 2013-08-05 NOTE — Clinical Documentation Improvement (Signed)
Possible Clinical conditions:  Morbid Obesity W/ BMI=48.16 kg/m Underweight w/BMI Other condition Cannot clinically determine   Supporting Information: Weight: 271 lb 13.2 oz (123.3 kg) "  Height: 5\' 3"  (1.6 m)   BMI: 48.16 kg/m   Thank You, Alessandra Grout, RN, BSN, CCDS, Clinical Documentation Specialist:  [phone]   CELL= [phone] [place] Information Management

## 2013-08-05 NOTE — Plan of Care (Signed)
Problem: Consults Goal: Diagnosis- Total Joint Replacement Outcome: Completed/Met Date Met:  08/05/13 Primary Total Knee LEFT

## 2013-08-05 NOTE — Progress Notes (Signed)
Subjective: 2 Days Post-Op Procedure(s) (LRB): TOTAL LEFT KNEE ARTHROPLASTY (Left) Patient reports pain as mild.   Patient seen in rounds with Dr. Wynelle Link. Patient is well, and has had no acute complaints or problems Patient is ready to go to [facility] today.  Objective: Vital signs in last 24 hours: Temp:  [97.3 F (36.3 C)-98.3 F (36.8 C)] 98.2 F (36.8 C) (03/11 0619) Pulse Rate:  [62-84] 71 (03/11 0619) Resp:  [18-20] 20 (03/11 0619) BP: (106-152)/(65-82) 152/82 mmHg (03/11 0619) SpO2:  [96 %-98 %] 97 % (03/11 0619)  Intake/Output from previous day:  Intake/Output Summary (Last 24 hours) at 08/05/13 0808 Last data filed at 08/05/13 [id number]  Gross per 24 hour  Intake 2337.67 ml  Output   3150 ml  Net -812.33 ml    Intake/Output this shift: Total I/O In: 240 [P.O.:240] Out: 1000 [Urine:1000]  Labs:  Recent Labs  08/04/13 0450 08/05/13 0506  HGB 9.6* 9.3*    Recent Labs  08/04/13 0450 08/05/13 0506  WBC 10.2 10.3  RBC 3.04* 3.00*  HCT 28.3* 28.6*  PLT 134* 132*    Recent Labs  08/04/13 0450 08/05/13 0506  NA 136* 136*  K 5.0 5.1  CL 101 102  CO2 23 23  BUN 29* 26*  CREATININE 1.06 0.90  GLUCOSE 253* 259*  CALCIUM 8.9 9.2   No results found for this basename: LABPT, INR,  in the last 72 hours  EXAM: General - Patient is Alert and Appropriate Extremity - Neurovascular intact Sensation intact distally Incision - clean, dry, no drainage Motor Function - intact, moving foot and toes well on exam.   Assessment/Plan: 2 Days Post-Op Procedure(s) (LRB): TOTAL LEFT KNEE ARTHROPLASTY (Left) Procedure(s) (LRB): TOTAL LEFT KNEE ARTHROPLASTY (Left) Past Medical History  Diagnosis Date  . Hypertension   . Hyperlipidemia   . Depression   . Anxiety   . Exogenous obesity   . Aortic stenosis   . Bilateral lower extremity edema   . CHF (congestive heart failure)   . Heart murmur     hx mild aortic stenosis  . Carotid artery stenosis    bilateral  . Arthritis     oa left knee  . Shortness of breath     if she does not take her lasix and with exertion  . Chest pain on exertion   . Gout   . Diabetes mellitus     DIET CONTROL  . Cancer     left breast CANCER, LUMPECTOMY AND RADIATION  . Breast cancer   . Leg cramps     BILATERAL  . Decreased range of motion     RIGHT ARM - HX OF SHOULDER SURGERY AND LIMITED ABILTIY TO RAISE RT ARM ABOVE HEAD  . Hemorrhoids   . History of kidney stones   . Shingles     YRS AGO - NO RESIDUAL PROBLEMS   Principal Problem:   OA (osteoarthritis) of knee Active Problems:   Pure hypercholesterolemia   Hyponatremia   Postoperative anemia due to acute blood loss   Diabetes mellitus type II, controlled  Estimated body mass index is 48.16 kg/(m^2) as calculated from the following:   Height as of this encounter: 5\' 3"  (1.6 m).   Weight as of this encounter: 123.3 kg (271 lb 13.2 oz). Up with therapy Discharge to SNF Diet - Cardiac diet and Diabetic diet Follow up - in 2 weeks Activity - WBAT Disposition - Skilled nursing facility Condition Upon Discharge - Good D/C  Meds - See DC Summary DVT Prophylaxis - Xarelto  Vadis Slabach 08/05/2013, 8:08 AM

## 2013-08-05 NOTE — Progress Notes (Signed)
Physical Therapy Treatment Patient Details Name: CLEMETINE ODENS MRN: [id number] DOB: Jul 16, 1935 Today's Date: 08/05/2013 Time: 1425-1440 PT Time Calculation (min): 15 min  PT Assessment / Plan / Recommendation  History of Present Illness s/p L TKA   PT Comments   POD # 2 assisted pt OOB to amb limited distance due to c/o fatigue and "some soreness but not bad".    Follow Up Recommendations  SNF     Does the patient have the potential to tolerate intense rehabilitation     Barriers to Discharge        Equipment Recommendations       Recommendations for Other Services    Frequency 7X/week   Progress towards PT Goals Progress towards PT goals: Progressing toward goals  Plan      Precautions / Restrictions     Pertinent Vitals/Pain     Mobility  Bed Mobility Overal bed mobility: Needs Assistance Bed Mobility: Supine to Sit Supine to sit: Min assist General bed mobility comments: cues for technique, support L leg off of/onto bed. Transfers Overall transfer level: Needs assistance Equipment used: Rolling walker (2 wheeled) Transfers: Sit to/from Stand Sit to Stand: Min assist;From elevated surface General transfer comment: cues for Hand placement and safety Ambulation/Gait Ambulation/Gait assistance: Min assist Ambulation Distance (Feet): 25 Feet Assistive device: Rolling walker (2 wheeled) Gait velocity: decr General Gait Details: cues for sequence, RW position    PT Goals (current goals can now be found in the care plan section)    Visit Information  Last PT Received On: 08/05/13 Assistance Needed: +1 History of Present Illness: s/p L TKA    Subjective Data      Cognition       Balance     End of Session PT - End of Session Equipment Utilized During Treatment: Gait belt Activity Tolerance: Patient tolerated treatment well Patient left: in chair;with call bell/phone within reach   Rica Koyanagi  PTA [facility]  Acute  Rehab Pager      [phone]

## 2013-08-05 NOTE — Progress Notes (Signed)
08/05/2013 1145 Pt scheduled to go to [facility] for rehab. Jonnie Finner RN CCM Case Mgmt phone [phone]

## 2013-08-05 NOTE — Progress Notes (Signed)
RN called report to Kathlee Nations at [facility]. All questions answered.   Packet sent with transport to SNF.

## 2013-08-06 ENCOUNTER — Encounter: Payer: Self-pay | Admitting: *Deleted

## 2013-08-06 NOTE — Progress Notes (Signed)
Clinical Social Work Department CLINICAL SOCIAL WORK PLACEMENT NOTE 08/06/2013  Patient:  CHRYEL, STINSON  Account Number:  [id number] Admit date:  08/03/2013  Clinical Social Worker:  Werner Lean, LCSW  Date/time:  08/04/2013 09:05 AM  Clinical Social Work is seeking post-discharge placement for this patient at the following level of care:   SKILLED NURSING   (*CSW will update this form in Epic as items are completed)   08/04/2013  Patient/family provided with [facility] Department of Clinical Social Work's list of facilities offering this level of care within the geographic area requested by the patient (or if unable, by the patient's family).  08/04/2013  Patient/family informed of their freedom to choose among providers that offer the needed level of care, that participate in Medicare, Medicaid or managed care program needed by the patient, have an available bed and are willing to accept the patient.    Patient/family informed of [facility]' ownership interest in [facility], as well as of the fact that they are under no obligation to receive care at this facility.  PASARR submitted to EDS on 08/04/2013 PASARR number received from EDS on 08/04/2013  FL2 transmitted to all facilities in geographic area requested by pt/family on  08/04/2013 FL2 transmitted to all facilities within larger geographic area on   Patient informed that his/her managed care company has contracts with or will negotiate with  certain facilities, including the following:     Patient/family informed of bed offers received:  08/04/2013 Patient chooses bed at [facility] Physician recommends and patient chooses bed at    Patient to be transferred to [facility] on  08/05/2013 Patient to be transferred to facility by [facility]  The following physician request were entered in Epic:   Additional Comments: [employer] provided authorization for SNF placement.  Werner Lean LCSW  [phone]

## 2013-08-11 ENCOUNTER — Other Ambulatory Visit: Payer: Self-pay | Admitting: *Deleted

## 2013-08-11 MED ORDER — OXYCODONE HCL 5 MG PO TABS: ORAL_TABLET | ORAL | Status: DC

## 2013-08-11 NOTE — Telephone Encounter (Signed)
[facility] 

## 2013-08-13 ENCOUNTER — Non-Acute Institutional Stay (SKILLED_NURSING_FACILITY): Payer: Medicare Other | Admitting: Internal Medicine

## 2013-08-13 ENCOUNTER — Encounter: Payer: Self-pay | Admitting: Internal Medicine

## 2013-08-13 DIAGNOSIS — I119 Hypertensive heart disease without heart failure: Secondary | ICD-10-CM

## 2013-08-13 DIAGNOSIS — Z96659 Presence of unspecified artificial knee joint: Secondary | ICD-10-CM

## 2013-08-13 DIAGNOSIS — D62 Acute posthemorrhagic anemia: Secondary | ICD-10-CM

## 2013-08-13 DIAGNOSIS — E119 Type 2 diabetes mellitus without complications: Secondary | ICD-10-CM

## 2013-08-13 DIAGNOSIS — E7800 Pure hypercholesterolemia, unspecified: Secondary | ICD-10-CM

## 2013-08-13 DIAGNOSIS — Z853 Personal history of malignant neoplasm of breast: Secondary | ICD-10-CM

## 2013-08-13 DIAGNOSIS — M109 Gout, unspecified: Secondary | ICD-10-CM

## 2013-08-13 NOTE — Assessment & Plan Note (Signed)
Apparently diet controlled?
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
## 2013-08-13 NOTE — Assessment & Plan Note (Signed)
Continue lipitor 40mg  

## 2013-08-13 NOTE — Assessment & Plan Note (Signed)
Continue Ziac, lasix , lisinopril

## 2013-08-13 NOTE — Assessment & Plan Note (Signed)
Continue allopurinol 

## 2013-08-13 NOTE — Assessment & Plan Note (Signed)
Continue tamoxifen 

## 2013-08-13 NOTE — Assessment & Plan Note (Signed)
2/2 OA; pain meds, robaxin and xarelto as prophylaxis; admitted for OT/PT

## 2013-08-13 NOTE — Assessment & Plan Note (Signed)
Pre-op 12.2, post op HB 9.3; monitor Hb, pt is on iron

## 2013-08-27 ENCOUNTER — Non-Acute Institutional Stay (SKILLED_NURSING_FACILITY): Payer: Medicare Other | Admitting: Internal Medicine

## 2013-08-27 ENCOUNTER — Encounter: Payer: Self-pay | Admitting: Internal Medicine

## 2013-08-27 DIAGNOSIS — M109 Gout, unspecified: Secondary | ICD-10-CM

## 2013-08-27 DIAGNOSIS — Z853 Personal history of malignant neoplasm of breast: Secondary | ICD-10-CM

## 2013-08-27 DIAGNOSIS — E119 Type 2 diabetes mellitus without complications: Secondary | ICD-10-CM

## 2013-08-27 DIAGNOSIS — Z96659 Presence of unspecified artificial knee joint: Secondary | ICD-10-CM

## 2013-08-27 DIAGNOSIS — I119 Hypertensive heart disease without heart failure: Secondary | ICD-10-CM

## 2013-08-27 DIAGNOSIS — E7800 Pure hypercholesterolemia, unspecified: Secondary | ICD-10-CM

## 2013-08-27 DIAGNOSIS — D62 Acute posthemorrhagic anemia: Secondary | ICD-10-CM
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
## 2013-09-07 ENCOUNTER — Other Ambulatory Visit: Payer: Medicare Other

## 2013-09-07 ENCOUNTER — Ambulatory Visit: Payer: Medicare Other | Admitting: Oncology

## 2013-09-10 ENCOUNTER — Telehealth: Payer: Self-pay | Admitting: Oncology

## 2013-09-10 NOTE — Telephone Encounter (Signed)
, °

## 2013-09-30 ENCOUNTER — Ambulatory Visit: Payer: Medicare Other | Admitting: Cardiology

## 2013-09-30 ENCOUNTER — Other Ambulatory Visit: Payer: Medicare Other

## 2013-10-28 ENCOUNTER — Encounter (HOSPITAL_COMMUNITY): Payer: Medicare Other

## 2013-10-28 ENCOUNTER — Other Ambulatory Visit (HOSPITAL_COMMUNITY): Payer: Self-pay | Admitting: Orthopedic Surgery

## 2013-10-28 ENCOUNTER — Ambulatory Visit (HOSPITAL_COMMUNITY)
Admission: RE | Admit: 2013-10-28 | Discharge: 2013-10-28 | Disposition: A | Discharge status: Home / Self Care | Payer: Medicare Other | Source: Ambulatory Visit | Attending: Cardiovascular Disease | Admitting: Cardiovascular Disease

## 2013-10-28 DIAGNOSIS — Z471 Aftercare following joint replacement surgery: Secondary | ICD-10-CM | POA: Insufficient documentation

## 2013-10-28 DIAGNOSIS — Z96659 Presence of unspecified artificial knee joint: Secondary | ICD-10-CM | POA: Insufficient documentation

## 2013-10-28 DIAGNOSIS — M1710 Unilateral primary osteoarthritis, unspecified knee: Secondary | ICD-10-CM | POA: Insufficient documentation

## 2013-10-28 NOTE — Progress Notes (Signed)
Left Lower Extremity Venous Duplex Completed. No evidence for DVT or SVT. °Brianna L Mazza,RVT °

## 2013-10-29 ENCOUNTER — Telehealth (HOSPITAL_COMMUNITY): Payer: Self-pay | Admitting: *Deleted

## 2013-11-04 ENCOUNTER — Telehealth (HOSPITAL_COMMUNITY): Payer: Self-pay | Admitting: *Deleted

## 2013-11-16 ENCOUNTER — Other Ambulatory Visit: Payer: Self-pay | Admitting: Adult Health

## 2013-11-16 DIAGNOSIS — Z853 Personal history of malignant neoplasm of breast: Secondary | ICD-10-CM

## 2013-11-24 ENCOUNTER — Telehealth: Payer: Self-pay | Admitting: Hematology and Oncology

## 2013-11-24 NOTE — Telephone Encounter (Signed)
, °

## 2013-11-25 ENCOUNTER — Ambulatory Visit
Admission: RE | Admit: 2013-11-25 | Discharge: 2013-11-25 | Disposition: A | Discharge status: Home / Self Care | Payer: Medicare Other | Source: Ambulatory Visit | Attending: Adult Health | Admitting: Adult Health

## 2013-11-25 ENCOUNTER — Encounter: Payer: Self-pay | Admitting: Cardiology

## 2013-11-25 ENCOUNTER — Ambulatory Visit (INDEPENDENT_AMBULATORY_CARE_PROVIDER_SITE_OTHER): Payer: Medicare Other | Admitting: *Deleted

## 2013-11-25 ENCOUNTER — Ambulatory Visit (INDEPENDENT_AMBULATORY_CARE_PROVIDER_SITE_OTHER): Payer: Medicare Other | Admitting: Cardiology

## 2013-11-25 VITALS — BP 151/65 | HR 64 | Ht 63.0 in | Wt 275.0 lb

## 2013-11-25 DIAGNOSIS — IMO0001 Reserved for inherently not codable concepts without codable children: Secondary | ICD-10-CM

## 2013-11-25 DIAGNOSIS — M109 Gout, unspecified: Secondary | ICD-10-CM

## 2013-11-25 DIAGNOSIS — Z853 Personal history of malignant neoplasm of breast: Secondary | ICD-10-CM

## 2013-11-25 DIAGNOSIS — I119 Hypertensive heart disease without heart failure: Secondary | ICD-10-CM

## 2013-11-25 DIAGNOSIS — M1009 Idiopathic gout, multiple sites: Secondary | ICD-10-CM

## 2013-11-25 DIAGNOSIS — R0989 Other specified symptoms and signs involving the circulatory and respiratory systems: Secondary | ICD-10-CM

## 2013-11-25 DIAGNOSIS — E1165 Type 2 diabetes mellitus with hyperglycemia: Secondary | ICD-10-CM

## 2013-11-25 DIAGNOSIS — I350 Nonrheumatic aortic (valve) stenosis: Secondary | ICD-10-CM

## 2013-11-25 DIAGNOSIS — M1712 Unilateral primary osteoarthritis, left knee: Secondary | ICD-10-CM

## 2013-11-25 DIAGNOSIS — I359 Nonrheumatic aortic valve disorder, unspecified: Secondary | ICD-10-CM

## 2013-11-25 DIAGNOSIS — M1710 Unilateral primary osteoarthritis, unspecified knee: Secondary | ICD-10-CM

## 2013-11-25 LAB — BASIC METABOLIC PANEL
BUN: 35 mg/dL — ABNORMAL HIGH (ref 6–23)
CHLORIDE: 105 meq/L (ref 96–112)
CO2: 22 meq/L (ref 19–32)
CREATININE: 1 mg/dL (ref 0.4–1.2)
Calcium: 9.9 mg/dL (ref 8.4–10.5)
GFR: 58 mL/min — ABNORMAL LOW (ref >60.00)
Glucose, Bld: 291 mg/dL — ABNORMAL HIGH (ref 70–99)
POTASSIUM: 4.5 meq/L (ref 3.5–5.1)
Sodium: 136 mEq/L (ref 135–145)

## 2013-11-25 NOTE — Assessment & Plan Note (Signed)
The patient has not had any symptoms from her mild aortic stenosis.

## 2013-11-25 NOTE — Assessment & Plan Note (Signed)
The patient has not been having any TIA symptoms referable to her bilateral carotid bruits.  She is on statins and she is on baby aspirin 81 mg daily.

## 2013-11-25 NOTE — Assessment & Plan Note (Signed)
[name] uric acid level prior to starting allopurinol was 10.0.  We are rechecking a uric acid level today.  She did have an episode of acute gout in her right foot when she missed her allopurinol for 4 days while in the rehabilitation facility.

## 2013-11-25 NOTE — Assessment & Plan Note (Signed)
She is pleased overall with the outcome from her left total knee replacement.  There is still some residual swelling which is improving gradually.

## 2013-11-25 NOTE — Progress Notes (Addendum)
Carrie Monroe Date of Birth:  1935/08/14 [place] [address] [place], [place]  [address] [phone]        Fax   [phone]   History of Present Illness:  This pleasant 78 year old married Caucasian female is seen for a scheduled office visit. He has a past history of essential hypertension, hypercholesterolemia, exogenous obesity, diabetes, and aortic stenosis. The patient has a past history of breast cancer. She had lumpectomy and radiation therapy to the left breast 3 years ago.  Since we last saw her she underwent left total knee replacement  by Dr. Wynelle Link.  Postoperatively she went to a rehabilitation facility before returning home. The patient has a history of diastolic heart failure. If she fails to take her Lasix she experiences shortness of breath. She previously had also complained of some chest discomfort which resolved after her heart failure improved. The patient had a Lexi scan Myoview stress test on 07/14/13 showing an ejection fraction of 60% with no wall motion abnormalities and a small scar in the basal anterolateral area with minimal peri-infarct ischemia. She has been cleared for surgery. The patient has known mild aortic stenosis.The patient had an echocardiogram on 11/03/12. This showed that she has mild aortic stenosis with a peak gradient of 33 and a mean gradient of 21. She has normal LV function with ejection fraction 60%. The patient has a harsh systolic murmur at the base radiating to the neck. She underwent carotid duplex on 07/22/13 showing moderate heterogeneous plaque bilaterally with 60-79% bilateral internal carotid artery stenosis and patent vertebral arteries with antegrade flow. The patient has not been having any TIA symptoms. She is on baby aspirin daily.  The patient has a history of hyperuricemia and has had attacks of gout.  In January 2015 her uric acid level was elevated at 10.0.  She is now on allopurinol 200 mg daily.   When she was in the rehabilitation facility she did not get her allopurinol initially and she did experience a flare up of gout in the right foot.  We are checking a uric acid level today on allopurinol. The patient has had morbid obesity.  Her BMI is in the range of 49.  She has lost 2 pounds since last visit.  Current Outpatient Prescriptions  Medication Sig Dispense Refill  . allopurinol (ZYLOPRIM) 100 MG tablet Take 100 mg by mouth 2 (two) times daily. For gout      . aspirin 81 MG tablet Take 81 mg by mouth daily.      Marland Kitchen atorvastatin (LIPITOR) 40 MG tablet Take 40 mg by mouth at bedtime. For hyperlipidemia      . bisoprolol-hydrochlorothiazide (ZIAC) 10-6.25 MG per tablet Take 1 tablet by mouth every morning. Treat blood pressure      . furosemide (LASIX) 40 MG tablet Take 40 mg by mouth daily. Every other day or every two days for HTN or edema.      Marland Kitchen lisinopril (PRINIVIL,ZESTRIL) 20 MG tablet Take 20 mg by mouth every morning. For HTN      . potassium chloride SA (K-DUR,KLOR-CON) 20 MEQ tablet Take 20 mEq by mouth daily. For treatment of low potassium.      . tamoxifen (NOLVADEX) 20 MG tablet Take 20 mg by mouth every morning. For treatment or prevent breast cancer.      . iron polysaccharides (NIFEREX) 150 MG capsule Take 150 mg by mouth daily. For prevention and treatment of iron deficiency.  No current facility-administered medications for this visit.    No Known Allergies  Patient Active Problem List   Diagnosis Date Noted  . S/P total knee arthroplasty 08/13/2013  . Hyponatremia 08/04/2013  . Postoperative anemia due to acute blood loss 08/04/2013  . Diabetes mellitus type II, controlled 08/04/2013  . Congestive heart failure, unspecified 08/04/2013  . OA (osteoarthritis) of knee 08/03/2013  . Carotid artery bruit 07/22/2013  . Bilateral leg cramps 07/22/2013  . Osteoarthritis of knee 07/22/2013  . Chest pain on exertion 06/22/2013  . Gout 06/22/2013  . Aortic  stenosis, mild 06/22/2013  . Dyspnea on exertion 10/09/2012  . Pure hypercholesterolemia 05/08/2011  . Type II or unspecified type diabetes mellitus without mention of complication, uncontrolled 05/08/2011  . Benign hypertensive heart disease without heart failure 05/08/2011  . History of breast cancer 03/16/2011    History  Smoking status  . Never Smoker   Smokeless tobacco  . Never Used    History  Alcohol Use No    Family History  Problem Relation Age of Onset  . Cancer Mother     pancreatic  . Heart disease Father   . Hypertension Father   . Cancer Brother     Review of Systems: Constitutional: no fever chills diaphoresis or fatigue or change in weight.  Head and neck: no hearing loss, no epistaxis, no photophobia or visual disturbance. Respiratory: No cough, shortness of breath or wheezing. Cardiovascular: No chest pain peripheral edema, palpitations. Gastrointestinal: No abdominal distention, no abdominal pain, no change in bowel habits hematochezia or melena. Genitourinary: No dysuria, no frequency, no urgency, no nocturia. Musculoskeletal:No arthralgias, no back pain, no gait disturbance or myalgias. Neurological: No dizziness, no headaches, no numbness, no seizures, no syncope, no weakness, no tremors. Hematologic: No lymphadenopathy, no easy bruising. Psychiatric: No confusion, no hallucinations, no sleep disturbance.    Physical Exam: Filed Vitals:   11/25/13 1124  BP: 151/65  Pulse: 64   the general appearance reveals a obese pleasant woman in no distress.The head and neck exam reveals pupils equal and reactive.  Extraocular movements are full.  There is no scleral icterus.  The mouth and pharynx are normal.  The neck is supple.  The carotids reveal bilateral carotid bruits. The jugular venous pressure is normal.  The  thyroid is not enlarged.  There is no lymphadenopathy.  The chest is clear to percussion and auscultation.  There are no rales or rhonchi.   Expansion of the chest is symmetrical.  The precordium is quiet.  The first heart sound is normal.  The second heart sound is physiologically split.  There is grade 2/6 systolic ejection murmur of aortic stenosis at the base. There is no abnormal lift or heave.  The abdomen is soft and nontender.  The bowel sounds are normal.  The liver and spleen are not enlarged.  There are no abdominal masses.  There are no abdominal bruits.  Extremities reveal good pedal pulses.  There is mild edema of the left lower leg and foot.  There is no cyanosis or clubbing.  Strength is normal and symmetrical in all extremities.  There is no lateralizing weakness.  There are no sensory deficits.  The skin is warm and dry.  There is no rash.    Assessment / Plan: 1. Hypertensive heart disease without heart failure 2. mild aortic stenosis 3. diabetes mellitus type II 4.  Bilateral carotid internal artery stenosis 60-79%. 5. history of gout 6.  History of hypercholesterolemia 7.  history of postop anemia 8. osteoarthritis, status post left total knee replacement. 9. morbid obesity  Plan: Continue same medication.  Work harder on weight loss and salt restriction.  We are checking a basal metabolic panel and a uric acid level today. She will be getting notification for a followup carotid Doppler for August 2015. We will see her in 4 months for followup office visit. She'll be having her annual physical with Dr. Brigitte Pulse soon.

## 2013-11-25 NOTE — Assessment & Plan Note (Signed)
Her systolic blood pressure is slightly elevated today.  She has been rushing around.  She will continue current medication blood work harder on salt restriction and weight loss.  She has not been experiencing any angina pectoris or palpitations or symptoms of CHF

## 2013-11-25 NOTE — Progress Notes (Signed)
Quick Note:  Please report to patient. The recent labs are stable. Continue same medication and careful diet. Blood sugar still very high. Followup with PCP. Kidneys dry. Drink more water. ______

## 2013-11-25 NOTE — Patient Instructions (Signed)
Your physician recommends that you continue on your current medications as directed. Please refer to the Current Medication list given to you today.  Your physician wants you to follow-up in: 4 month ov You will receive a reminder letter in the mail two months in advance. If you don't receive a letter, please call our office to schedule the follow-up appointment.   Work harder on diet and weight loss

## 2013-11-26 ENCOUNTER — Ambulatory Visit: Payer: Medicare Other

## 2013-11-26 DIAGNOSIS — M109 Gout, unspecified: Secondary | ICD-10-CM

## 2013-11-26 LAB — URIC ACID: URIC ACID, SERUM: 6.7 mg/dL (ref 2.4–7.0)

## 2013-11-30 ENCOUNTER — Telehealth: Payer: Self-pay | Admitting: Cardiology

## 2013-11-30 NOTE — Telephone Encounter (Signed)
Left message to call back  

## 2013-11-30 NOTE — Telephone Encounter (Signed)
Advised patient of lab results and will send to Dr Brigitte Pulse her PCP

## 2013-11-30 NOTE — Telephone Encounter (Signed)
Message copied by Earvin Hansen on Mon Nov 30, 2013 10:36 AM ------      Message from: Darlin Coco      Created: Wed Nov 25, 2013  9:47 PM       Please report to patient.  The recent labs are stable. Continue same medication and careful diet. Blood sugar still very high.  Followup with PCP.  Kidneys dry. Drink more water. ------

## 2013-11-30 NOTE — Telephone Encounter (Signed)
Message copied by Earvin Hansen on Mon Nov 30, 2013 10:36 AM ------      Message from: Darlin Coco      Created: Sun Nov 29, 2013  5:43 PM       Uric acid is normal now on current dose of allopurinol.  CSD ------

## 2013-11-30 NOTE — Telephone Encounter (Signed)
New problem   Pt returning your call. Please call pt.

## 2013-12-04 ENCOUNTER — Ambulatory Visit: Payer: Medicare Other | Admitting: Oncology

## 2013-12-04 ENCOUNTER — Other Ambulatory Visit: Payer: Medicare Other

## 2013-12-10 ENCOUNTER — Other Ambulatory Visit: Payer: Self-pay | Admitting: Dermatology

## 2013-12-22 ENCOUNTER — Other Ambulatory Visit: Payer: Self-pay | Admitting: *Deleted

## 2013-12-22 MED ORDER — ALLOPURINOL 100 MG PO TABS: 100.0000 mg | ORAL_TABLET | Freq: Two times a day (BID) | ORAL | Status: DC

## 2013-12-22 MED ORDER — POTASSIUM CHLORIDE CRYS ER 20 MEQ PO TBCR
20.0000 meq | EXTENDED_RELEASE_TABLET | Freq: Every day | ORAL | Status: DC
Start: 2013-12-22 — End: 2015-03-16

## 2014-02-11 ENCOUNTER — Other Ambulatory Visit: Payer: Self-pay

## 2014-02-11 DIAGNOSIS — Z853 Personal history of malignant neoplasm of breast: Secondary | ICD-10-CM

## 2014-02-12 ENCOUNTER — Encounter: Payer: Self-pay | Admitting: Hematology and Oncology

## 2014-02-12 ENCOUNTER — Other Ambulatory Visit (HOSPITAL_BASED_OUTPATIENT_CLINIC_OR_DEPARTMENT_OTHER): Payer: Medicare Other

## 2014-02-12 ENCOUNTER — Ambulatory Visit (HOSPITAL_BASED_OUTPATIENT_CLINIC_OR_DEPARTMENT_OTHER): Payer: Medicare Other | Admitting: Hematology and Oncology

## 2014-02-12 ENCOUNTER — Telehealth: Payer: Self-pay | Admitting: Hematology and Oncology

## 2014-02-12 VITALS — BP 162/62 | HR 79 | Temp 98.1°F | Resp 18 | Ht 63.0 in | Wt 275.0 lb

## 2014-02-12 DIAGNOSIS — F3289 Other specified depressive episodes: Secondary | ICD-10-CM

## 2014-02-12 DIAGNOSIS — Z853 Personal history of malignant neoplasm of breast: Secondary | ICD-10-CM

## 2014-02-12 DIAGNOSIS — F329 Major depressive disorder, single episode, unspecified: Secondary | ICD-10-CM

## 2014-02-12 DIAGNOSIS — C50412 Malignant neoplasm of upper-outer quadrant of left female breast: Secondary | ICD-10-CM

## 2014-02-12 LAB — COMPREHENSIVE METABOLIC PANEL (CC13)
ALT: 20 U/L (ref 0–55)
ANION GAP: 10 meq/L (ref 3–11)
AST: 25 U/L (ref 5–34)
Albumin: 3.3 g/dL — ABNORMAL LOW (ref 3.5–5.0)
Alkaline Phosphatase: 81 U/L (ref 40–150)
BILIRUBIN TOTAL: 0.49 mg/dL (ref 0.20–1.20)
BUN: 13.6 mg/dL (ref 7.0–26.0)
CALCIUM: 9.4 mg/dL (ref 8.4–10.4)
CHLORIDE: 110 meq/L — AB (ref 98–109)
CO2: 21 mEq/L — ABNORMAL LOW (ref 22–29)
CREATININE: 0.9 mg/dL (ref 0.6–1.1)
GLUCOSE: 191 mg/dL — AB (ref 70–140)
Potassium: 4.3 mEq/L (ref 3.5–5.1)
Sodium: 141 mEq/L (ref 136–145)
Total Protein: 6.8 g/dL (ref 6.4–8.3)

## 2014-02-12 LAB — CBC WITH DIFFERENTIAL/PLATELET
BASO%: 0.6 % (ref 0.0–2.0)
BASOS ABS: 0 10*3/uL (ref 0.0–0.1)
EOS ABS: 0.2 10*3/uL (ref 0.0–0.5)
EOS%: 4.2 % (ref 0.0–7.0)
HCT: 36.9 % (ref 34.8–46.6)
HEMOGLOBIN: 12.1 g/dL (ref 11.6–15.9)
LYMPH#: 1.5 10*3/uL (ref 0.9–3.3)
LYMPH%: 31.2 % (ref 14.0–49.7)
MCH: 31.3 pg (ref 25.1–34.0)
MCHC: 32.8 g/dL (ref 31.5–36.0)
MCV: 95.3 fL (ref 79.5–101.0)
MONO#: 0.4 10*3/uL (ref 0.1–0.9)
MONO%: 8.5 % (ref 0.0–14.0)
NEUT%: 55.5 % (ref 38.4–76.8)
NEUTROS ABS: 2.7 10*3/uL (ref 1.5–6.5)
PLATELETS: 113 10*3/uL — AB (ref 145–400)
RBC: 3.87 10*6/uL (ref 3.70–5.45)
RDW: 13.5 % (ref 11.2–14.5)
WBC: 4.8 10*3/uL (ref 3.9–10.3)

## 2014-02-12 NOTE — Progress Notes (Signed)
Patient Care Team: Marton Redwood, MD as PCP - General (Internal Medicine)  DIAGNOSIS: No matching staging information was found for the patient.  SUMMARY OF ONCOLOGIC HISTORY:   Breast cancer of upper-outer quadrant of left female breast   10/28/2007 Initial Diagnosis Breast cancer of upper-outer quadrant of left female breast: Invasive mammary cancer with tubular features low-grade ER 98% PR 10% Ki-67 5% HER-2 negative ratio 1.42: MRI: 1 x 0.8 x 1.1 cm enhancement 3:00 position   11/26/2007 Surgery Left lumpectomy with sentinel lymph node biopsy, 1.2 cm in grade 1, IDC 3 SLN negative margins clear T1 B. N0 M0 stage IA   01/22/2008 - 02/19/2008 Radiation Therapy Radiation therapy to lumpectomy site by Dr. Valere Dross   02/26/2008 -  Anti-estrogen oral therapy Femara then switched to Aromasin then switched to Tamoxifen 20 mg (Dec 2009) daily due to hot flashes    CHIEF COMPLIANT: Annual followup with a history of left breast cancer  INTERVAL HISTORY: Carrie Monroe is a 78 year old lady with above-mentioned history of left breast cancer. She was diagnosed in 2009 underwent lumpectomy and radiation and has been on antiestrogen therapy for the past 6 years. She has been on tamoxifen for the past 5 years. She reports that she is tolerating it very well except for loss of libido and some fatigue issues and maybe even mild depression symptoms. But she does not want to take anything for any of these symptoms. She wanted to know if she can continue the tamoxifen beyond 5 years. She continues to have the thickening of the left breast where she had surgery and radiation.  REVIEW OF SYSTEMS:   Constitutional: Denies fevers, chills or abnormal weight loss Eyes: Denies blurriness of vision Ears, nose, mouth, throat, and face: Denies mucositis or sore throat Respiratory: Denies cough, dyspnea or wheezes Cardiovascular: Denies palpitation, chest discomfort or lower extremity swelling Gastrointestinal:  Denies nausea,  heartburn or change in bowel habits Skin: Denies abnormal skin rashes Lymphatics: Denies new lymphadenopathy or easy bruising Neurological:Denies numbness, tingling or new weaknesses Behavioral/Psych: Mood is stable, no new changes  Breast: Left breast upper outer quadrant still thickened and fibrotic from prior radiation All other systems were reviewed with the patient and are negative.  I have reviewed the past medical history, past surgical history, social history and family history with the patient and they are unchanged from previous note.  ALLERGIES:  has No Known Allergies.  MEDICATIONS:  Current Outpatient Prescriptions  Medication Sig Dispense Refill  . allopurinol (ZYLOPRIM) 100 MG tablet Take 1 tablet (100 mg total) by mouth 2 (two) times daily. For gout  180 tablet  1  . aspirin 81 MG tablet Take 81 mg by mouth daily.      Marland Kitchen atorvastatin (LIPITOR) 40 MG tablet Take 40 mg by mouth at bedtime. For hyperlipidemia      . bisoprolol-hydrochlorothiazide (ZIAC) 10-6.25 MG per tablet Take 1 tablet by mouth every morning. Treat blood pressure      . furosemide (LASIX) 40 MG tablet Take 40 mg by mouth daily. Every other day or every two days for HTN or edema.      Marland Kitchen lisinopril (PRINIVIL,ZESTRIL) 20 MG tablet Take 20 mg by mouth every morning. For HTN      . metFORMIN (GLUCOPHAGE) 1000 MG tablet Take 1,000 mg by mouth 2 (two) times daily with a meal.      . potassium chloride SA (K-DUR,KLOR-CON) 20 MEQ tablet Take 1 tablet (20 mEq total) by mouth daily. For treatment  of low potassium.  90 tablet  1  . tamoxifen (NOLVADEX) 20 MG tablet Take 20 mg by mouth every morning. For treatment or prevent breast cancer.       No current facility-administered medications for this visit.    PHYSICAL EXAMINATION: ECOG PERFORMANCE STATUS: 0 - Asymptomatic  Filed Vitals:   02/12/14 1113  BP: 162/62  Pulse: 79  Temp: 98.1 F (36.7 C)  Resp: 18   Filed Weights   02/12/14 1113  Weight: 275 lb  (124.739 kg)    GENERAL:alert, no distress and comfortable SKIN: skin color, texture, turgor are normal, no rashes or significant lesions EYES: normal, Conjunctiva are pink and non-injected, sclera clear OROPHARYNX:no exudate, no erythema and lips, buccal mucosa, and tongue normal  NECK: supple, thyroid normal size, non-tender, without nodularity LYMPH:  no palpable lymphadenopathy in the cervical, axillary or inguinal LUNGS: clear to auscultation and percussion with normal breathing effort HEART: regular rate & rhythm and no murmurs and no lower extremity edema ABDOMEN:abdomen soft, non-tender and normal bowel sounds Musculoskeletal:no cyanosis of digits and no clubbing  NEURO: alert & oriented x 3 with fluent speech, no focal motor/sensory deficits BREAST: No palpable masses or nodules in either right or left breasts. No palpable axillary supraclavicular or infraclavicular adenopathy no breast tenderness or nipple discharge.   LABORATORY DATA:  I have reviewed the data as listed   Chemistry      Component Value Date/Time   NA 141 02/12/2014 1058   NA 136 11/25/2013 1120   K 4.3 02/12/2014 1058   K 4.5 11/25/2013 1120   CL 105 11/25/2013 1120   CL 111* 08/28/2012 1224   CO2 21* 02/12/2014 1058   CO2 22 11/25/2013 1120   BUN 13.6 02/12/2014 1058   BUN 35* 11/25/2013 1120   CREATININE 0.9 02/12/2014 1058   CREATININE 1.0 11/25/2013 1120      Component Value Date/Time   CALCIUM 9.4 02/12/2014 1058   CALCIUM 9.9 11/25/2013 1120   ALKPHOS 81 02/12/2014 1058   ALKPHOS 91 07/28/2013 1400   AST 25 02/12/2014 1058   AST 27 07/28/2013 1400   ALT 20 02/12/2014 1058   ALT 25 07/28/2013 1400   BILITOT 0.49 02/12/2014 1058   BILITOT 0.5 07/28/2013 1400       Lab Results  Component Value Date   WBC 4.8 02/12/2014   HGB 12.1 02/12/2014   HCT 36.9 02/12/2014   MCV 95.3 02/12/2014   PLT 113* 02/12/2014   NEUTROABS 2.7 02/12/2014     RADIOGRAPHIC STUDIES: I have personally reviewed the radiology reports and  agreed with their findings. No results found.   ASSESSMENT & PLAN:  Breast cancer of upper-outer quadrant of left female breast Stage IA breast cancer diagnosed in 2009 ER/PR positive HER-2 negative currently on antiestrogen therapy with tamoxifen tolerating it very well. She is being on antiestrogen therapy for now over 5 years. Patient wishes to continue this therapy beyond 5 years.  Her major side effects and treatment are loss of libido and some underlying mild depression symptoms. But she does not think it is influencing her lifestyle to the point that she needs to stop therapy. I did suggest that she could stop tamoxifen and see how it makes her feel. But she is not keen on stopping it and would like to continue it further. I reviewed her mammograms were normal and today's breast exam was also normal.  Return to clinic in one year with another mammogram and followup.  Survivorship: Discussed the importance of physical exercise in decreasing the likelihood of breast cancer recurrence. Recommended 30 mins daily 6 days a week. Encouraged patient to eat more fruits and vegetables and decrease red meat.     No orders of the defined types were placed in this encounter.   The patient has a good understanding of the overall plan. she agrees with it. She will call with any problems that may develop before her next visit here.  I spent 25 minutes counseling the patient face to face. The total time spent in the appointment was 30 minutes and more than 50% was on counseling and review of test results    Rulon Eisenmenger, MD 02/12/2014 12:16 PM

## 2014-02-12 NOTE — Addendum Note (Signed)
Addended by: Prentiss Bells on: 02/12/2014 05:51 PM   Modules accepted: Orders

## 2014-02-12 NOTE — Assessment & Plan Note (Signed)
Stage IA breast cancer diagnosed in 2009 ER/PR positive HER-2 negative currently on antiestrogen therapy with tamoxifen tolerating it very well. She is being on antiestrogen therapy for now over 5 years. Patient wishes to continue this therapy beyond 5 years.  Her major side effects and treatment are loss of libido and some underlying mild depression symptoms. But she does not think it is influencing her lifestyle to the point that she needs to stop therapy. I did suggest that she could stop tamoxifen and see how it makes her feel. But she is not keen on stopping it and would like to continue it further. I reviewed her mammograms were normal and today's breast exam was also normal.  Return to clinic in one year with another mammogram and followup.  Survivorship: Discussed the importance of physical exercise in decreasing the likelihood of breast cancer recurrence. Recommended 30 mins daily 6 days a week. Encouraged patient to eat more fruits and vegetables and decrease red meat.

## 2014-02-12 NOTE — Telephone Encounter (Signed)
, °

## 2014-03-01 ENCOUNTER — Other Ambulatory Visit: Payer: Self-pay

## 2014-03-01 DIAGNOSIS — C50412 Malignant neoplasm of upper-outer quadrant of left female breast: Secondary | ICD-10-CM

## 2014-03-01 MED ORDER — TAMOXIFEN CITRATE 20 MG PO TABS: 20.0000 mg | ORAL_TABLET | ORAL | Status: DC

## 2014-03-01 NOTE — Telephone Encounter (Signed)
Let pt know refill for Tamoxifen has been sent to [facility] - 90 days supply with 3 refills, receipt confirmed.  Pt voiced understanding.

## 2014-08-25 ENCOUNTER — Other Ambulatory Visit: Payer: Self-pay | Admitting: *Deleted

## 2014-08-25 DIAGNOSIS — C50412 Malignant neoplasm of upper-outer quadrant of left female breast: Secondary | ICD-10-CM

## 2014-08-25 MED ORDER — TAMOXIFEN CITRATE 20 MG PO TABS: 20.0000 mg | ORAL_TABLET | ORAL | Status: DC

## 2014-11-03 ENCOUNTER — Encounter: Payer: Self-pay | Admitting: Internal Medicine

## 2014-12-02 ENCOUNTER — Ambulatory Visit
Admission: RE | Admit: 2014-12-02 | Discharge: 2014-12-02 | Disposition: A | Discharge status: Home / Self Care | Payer: Medicare Other | Source: Ambulatory Visit | Attending: Hematology and Oncology | Admitting: Hematology and Oncology

## 2014-12-02 DIAGNOSIS — C50412 Malignant neoplasm of upper-outer quadrant of left female breast: Secondary | ICD-10-CM

## 2014-12-06 ENCOUNTER — Other Ambulatory Visit: Payer: Self-pay | Admitting: General Surgery

## 2014-12-06 DIAGNOSIS — R921 Mammographic calcification found on diagnostic imaging of breast: Secondary | ICD-10-CM

## 2014-12-15 ENCOUNTER — Other Ambulatory Visit: Payer: Medicare Other

## 2014-12-16 ENCOUNTER — Ambulatory Visit
Admission: RE | Admit: 2014-12-16 | Discharge: 2014-12-16 | Disposition: A | Discharge status: Home / Self Care | Payer: Medicare Other | Source: Ambulatory Visit | Attending: General Surgery | Admitting: General Surgery

## 2014-12-16 DIAGNOSIS — R921 Mammographic calcification found on diagnostic imaging of breast: Secondary | ICD-10-CM

## 2014-12-16 MED ORDER — GADOBENATE DIMEGLUMINE 529 MG/ML IV SOLN
20.0000 mL | Freq: Once | INTRAVENOUS | Status: AC | PRN
  Administered 2014-12-16: 20 mL via INTRAVENOUS

## 2014-12-24 ENCOUNTER — Other Ambulatory Visit: Payer: Medicare Other

## 2014-12-31 ENCOUNTER — Other Ambulatory Visit: Payer: Self-pay | Admitting: General Surgery

## 2014-12-31 DIAGNOSIS — R921 Mammographic calcification found on diagnostic imaging of breast: Secondary | ICD-10-CM

## 2015-01-05 ENCOUNTER — Encounter (HOSPITAL_BASED_OUTPATIENT_CLINIC_OR_DEPARTMENT_OTHER)
Admission: RE | Admit: 2015-01-05 | Discharge: 2015-01-05 | Disposition: A | Discharge status: Home / Self Care | Payer: Medicare Other | Source: Ambulatory Visit | Attending: General Surgery | Admitting: General Surgery

## 2015-01-05 DIAGNOSIS — Z6837 Body mass index (BMI) 37.0-37.9, adult: Secondary | ICD-10-CM | POA: Diagnosis not present

## 2015-01-05 DIAGNOSIS — R921 Mammographic calcification found on diagnostic imaging of breast: Secondary | ICD-10-CM | POA: Diagnosis present

## 2015-01-05 DIAGNOSIS — Z853 Personal history of malignant neoplasm of breast: Secondary | ICD-10-CM | POA: Diagnosis not present

## 2015-01-05 DIAGNOSIS — I509 Heart failure, unspecified: Secondary | ICD-10-CM | POA: Diagnosis not present

## 2015-01-05 DIAGNOSIS — Z79899 Other long term (current) drug therapy: Secondary | ICD-10-CM | POA: Diagnosis not present

## 2015-01-05 DIAGNOSIS — Z79810 Long term (current) use of selective estrogen receptor modulators (SERMs): Secondary | ICD-10-CM | POA: Diagnosis not present

## 2015-01-05 DIAGNOSIS — I10 Essential (primary) hypertension: Secondary | ICD-10-CM | POA: Diagnosis not present

## 2015-01-05 DIAGNOSIS — E119 Type 2 diabetes mellitus without complications: Secondary | ICD-10-CM | POA: Diagnosis not present

## 2015-01-05 DIAGNOSIS — Z7982 Long term (current) use of aspirin: Secondary | ICD-10-CM | POA: Diagnosis not present

## 2015-01-05 LAB — BASIC METABOLIC PANEL
ANION GAP: 10 (ref 5–15)
BUN: 21 mg/dL — AB (ref 6–20)
CHLORIDE: 108 mmol/L (ref 101–111)
CO2: 22 mmol/L (ref 22–32)
CREATININE: 1.14 mg/dL — AB (ref 0.44–1.00)
Calcium: 9.7 mg/dL (ref 8.9–10.3)
GFR calc Af Amer: 53 mL/min — ABNORMAL LOW (ref >60)
GFR calc non Af Amer: 46 mL/min — ABNORMAL LOW (ref >60)
GLUCOSE: 109 mg/dL — AB (ref 65–99)
Potassium: 4.6 mmol/L (ref 3.5–5.1)
Sodium: 140 mmol/L (ref 135–145)

## 2015-01-05 NOTE — Progress Notes (Signed)
PT in for EKG and BMET.  Seen by Dr. Al Corpus for anesthesia consult.  Pt is c/o tooth pain on back left side.  Temp 98.5.  Dr Marlou Starks and Dr Al Corpus aware. OK for surgery in am.

## 2015-01-06 ENCOUNTER — Ambulatory Visit (HOSPITAL_COMMUNITY)
Admission: RE | Admit: 2015-01-06 | Discharge: 2015-01-06 | Disposition: A | Discharge status: Home / Self Care | Payer: Medicare Other | Source: Ambulatory Visit | Attending: General Surgery | Admitting: General Surgery

## 2015-01-06 ENCOUNTER — Ambulatory Visit
Admission: RE | Admit: 2015-01-06 | Discharge: 2015-01-06 | Disposition: A | Discharge status: Home / Self Care | Payer: Medicare Other | Source: Ambulatory Visit | Attending: General Surgery | Admitting: General Surgery

## 2015-01-06 ENCOUNTER — Ambulatory Visit (HOSPITAL_BASED_OUTPATIENT_CLINIC_OR_DEPARTMENT_OTHER): Payer: Medicare Other | Admitting: Anesthesiology

## 2015-01-06 ENCOUNTER — Encounter (HOSPITAL_BASED_OUTPATIENT_CLINIC_OR_DEPARTMENT_OTHER)
Admission: RE | Disposition: A | Discharge status: Home / Self Care | Payer: Self-pay | Source: Ambulatory Visit | Attending: General Surgery

## 2015-01-06 ENCOUNTER — Encounter (HOSPITAL_COMMUNITY): Payer: Self-pay | Admitting: Anesthesiology

## 2015-01-06 DIAGNOSIS — R921 Mammographic calcification found on diagnostic imaging of breast: Secondary | ICD-10-CM

## 2015-01-06 DIAGNOSIS — Z6837 Body mass index (BMI) 37.0-37.9, adult: Secondary | ICD-10-CM | POA: Insufficient documentation

## 2015-01-06 DIAGNOSIS — Z79899 Other long term (current) drug therapy: Secondary | ICD-10-CM | POA: Insufficient documentation

## 2015-01-06 DIAGNOSIS — Z853 Personal history of malignant neoplasm of breast: Secondary | ICD-10-CM | POA: Insufficient documentation

## 2015-01-06 DIAGNOSIS — Z79810 Long term (current) use of selective estrogen receptor modulators (SERMs): Secondary | ICD-10-CM | POA: Insufficient documentation

## 2015-01-06 DIAGNOSIS — I10 Essential (primary) hypertension: Secondary | ICD-10-CM | POA: Insufficient documentation

## 2015-01-06 DIAGNOSIS — E119 Type 2 diabetes mellitus without complications: Secondary | ICD-10-CM | POA: Insufficient documentation

## 2015-01-06 DIAGNOSIS — I509 Heart failure, unspecified: Secondary | ICD-10-CM | POA: Insufficient documentation

## 2015-01-06 DIAGNOSIS — Z7982 Long term (current) use of aspirin: Secondary | ICD-10-CM | POA: Insufficient documentation

## 2015-01-06 HISTORY — PX: BREAST LUMPECTOMY WITH NEEDLE LOCALIZATION: SHX5759

## 2015-01-06 LAB — GLUCOSE, CAPILLARY
GLUCOSE-CAPILLARY: 136 mg/dL — AB (ref 65–99)
Glucose-Capillary: 152 mg/dL — ABNORMAL HIGH (ref 65–99)

## 2015-01-06 LAB — POCT HEMOGLOBIN-HEMACUE: HEMOGLOBIN: 9.2 g/dL — AB (ref 12.0–15.0)

## 2015-01-06 SURGERY — BREAST LUMPECTOMY WITH NEEDLE LOCALIZATION
Anesthesia: General | Site: Breast | Laterality: Left

## 2015-01-06 MED ORDER — OXYCODONE HCL 5 MG PO TABS
ORAL_TABLET | ORAL | Status: AC
  Filled 2015-01-06: qty 1

## 2015-01-06 MED ORDER — OXYCODONE HCL 5 MG PO TABS
5.0000 mg | ORAL_TABLET | Freq: Once | ORAL | Status: AC | PRN
  Administered 2015-01-06: 5 mg via ORAL

## 2015-01-06 MED ORDER — SCOPOLAMINE 1 MG/3DAYS TD PT72: 1.0000 | MEDICATED_PATCH | Freq: Once | TRANSDERMAL | Status: DC | PRN

## 2015-01-06 MED ORDER — OXYCODONE-ACETAMINOPHEN 5-325 MG PO TABS: 1.0000 | ORAL_TABLET | ORAL | Status: DC | PRN

## 2015-01-06 MED ORDER — LACTATED RINGERS IV SOLN
INTRAVENOUS | Status: DC | PRN
Start: 2015-01-06 — End: 2015-01-06
  Administered 2015-01-06: 11:00:00 via INTRAVENOUS

## 2015-01-06 MED ORDER — GLYCOPYRROLATE 0.2 MG/ML IJ SOLN: 0.2000 mg | Freq: Once | INTRAMUSCULAR | Status: DC | PRN

## 2015-01-06 MED ORDER — BUPIVACAINE HCL (PF) 0.25 % IJ SOLN
INTRAMUSCULAR | Status: DC | PRN
  Administered 2015-01-06: 10 mL

## 2015-01-06 MED ORDER — EPHEDRINE SULFATE 50 MG/ML IJ SOLN
INTRAMUSCULAR | Status: DC | PRN
  Administered 2015-01-06: 10 mg via INTRAVENOUS

## 2015-01-06 MED ORDER — PROPOFOL 10 MG/ML IV BOLUS
INTRAVENOUS | Status: DC | PRN
  Administered 2015-01-06: 150 mg via INTRAVENOUS

## 2015-01-06 MED ORDER — FENTANYL CITRATE (PF) 100 MCG/2ML IJ SOLN
INTRAMUSCULAR | Status: DC | PRN
  Administered 2015-01-06 (×2): 25 ug via INTRAVENOUS

## 2015-01-06 MED ORDER — HYDROMORPHONE HCL 1 MG/ML IJ SOLN
0.2500 mg | INTRAMUSCULAR | Status: DC | PRN
  Administered 2015-01-06: 0.5 mg via INTRAVENOUS

## 2015-01-06 MED ORDER — CEFAZOLIN SODIUM-DEXTROSE 2-3 GM-% IV SOLR
2.0000 g | INTRAVENOUS | Status: AC
  Administered 2015-01-06: 2 g via INTRAVENOUS

## 2015-01-06 MED ORDER — CHLORHEXIDINE GLUCONATE 4 % EX LIQD: 1.0000 "application " | Freq: Once | CUTANEOUS | Status: DC

## 2015-01-06 MED ORDER — CEFAZOLIN SODIUM-DEXTROSE 2-3 GM-% IV SOLR
INTRAVENOUS | Status: AC
  Filled 2015-01-06: qty 50

## 2015-01-06 MED ORDER — LACTATED RINGERS IV SOLN: INTRAVENOUS | Status: DC

## 2015-01-06 MED ORDER — PHENYLEPHRINE HCL 10 MG/ML IJ SOLN
INTRAMUSCULAR | Status: DC | PRN
  Administered 2015-01-06: 40 ug via INTRAVENOUS

## 2015-01-06 MED ORDER — ONDANSETRON HCL 4 MG/2ML IJ SOLN
INTRAMUSCULAR | Status: DC | PRN
  Administered 2015-01-06: 4 mg via INTRAVENOUS

## 2015-01-06 MED ORDER — FENTANYL CITRATE (PF) 100 MCG/2ML IJ SOLN
INTRAMUSCULAR | Status: AC
  Filled 2015-01-06: qty 6

## 2015-01-06 MED ORDER — MEPERIDINE HCL 25 MG/ML IJ SOLN: 6.2500 mg | INTRAMUSCULAR | Status: DC | PRN

## 2015-01-06 MED ORDER — HYDROMORPHONE HCL 1 MG/ML IJ SOLN
INTRAMUSCULAR | Status: AC
  Filled 2015-01-06: qty 1

## 2015-01-06 MED ORDER — OXYCODONE HCL 5 MG/5ML PO SOLN: 5.0000 mg | Freq: Once | ORAL | Status: AC | PRN

## 2015-01-06 MED ORDER — LIDOCAINE HCL (CARDIAC) 20 MG/ML IV SOLN
INTRAVENOUS | Status: DC | PRN
  Administered 2015-01-06: 30 mg via INTRAVENOUS

## 2015-01-06 SURGICAL SUPPLY — 38 items
BLADE SURG 15 STRL LF DISP TIS (BLADE) ×1 IMPLANT
BLADE SURG 15 STRL SS (BLADE) ×2
CANISTER SUCT 1200ML W/VALVE (MISCELLANEOUS) ×2 IMPLANT
CHLORAPREP W/TINT 26ML (MISCELLANEOUS) ×2 IMPLANT
CLIP TI WIDE RED SMALL 6 (CLIP) IMPLANT
COVER BACK TABLE 60X90IN (DRAPES) ×2 IMPLANT
COVER MAYO STAND STRL (DRAPES) ×2 IMPLANT
DECANTER SPIKE VIAL GLASS SM (MISCELLANEOUS) ×2 IMPLANT
DEVICE DUBIN W/COMP PLATE 8390 (MISCELLANEOUS) ×2 IMPLANT
DRAPE LAPAROSCOPIC ABDOMINAL (DRAPES) ×2 IMPLANT
DRAPE UTILITY XL STRL (DRAPES) ×2 IMPLANT
ELECT COATED BLADE 2.86 ST (ELECTRODE) ×2 IMPLANT
ELECT REM PT RETURN 9FT ADLT (ELECTROSURGICAL) ×2
ELECTRODE REM PT RTRN 9FT ADLT (ELECTROSURGICAL) ×1 IMPLANT
GLOVE BIO SURGEON STRL SZ 6.5 (GLOVE) ×1 IMPLANT
GLOVE BIO SURGEON STRL SZ7.5 (GLOVE) ×2 IMPLANT
GLOVE BIOGEL PI IND STRL 7.0 (GLOVE) IMPLANT
GLOVE BIOGEL PI INDICATOR 7.0 (GLOVE) ×2
GOWN STRL REUS W/ TWL LRG LVL3 (GOWN DISPOSABLE) ×2 IMPLANT
GOWN STRL REUS W/TWL LRG LVL3 (GOWN DISPOSABLE) ×4
KIT MARKER MARGIN INK (KITS) ×1 IMPLANT
LIQUID BAND (GAUZE/BANDAGES/DRESSINGS) ×2 IMPLANT
NDL HYPO 25X1 1.5 SAFETY (NEEDLE) ×1 IMPLANT
NEEDLE HYPO 25X1 1.5 SAFETY (NEEDLE) ×2 IMPLANT
NS IRRIG 1000ML POUR BTL (IV SOLUTION) ×2 IMPLANT
PACK BASIN DAY SURGERY FS (CUSTOM PROCEDURE TRAY) ×2 IMPLANT
PENCIL BUTTON HOLSTER BLD 10FT (ELECTRODE) ×2 IMPLANT
SLEEVE SCD COMPRESS KNEE MED (MISCELLANEOUS) ×2 IMPLANT
SPONGE LAP 18X18 X RAY DECT (DISPOSABLE) ×2 IMPLANT
STAPLER VISISTAT 35W (STAPLE) IMPLANT
SUT MON AB 4-0 PC3 18 (SUTURE) ×2 IMPLANT
SUT SILK 2 0 SH (SUTURE) ×2 IMPLANT
SUT VICRYL 3-0 CR8 SH (SUTURE) ×2 IMPLANT
SYR CONTROL 10ML LL (SYRINGE) ×2 IMPLANT
TOWEL OR 17X24 6PK STRL BLUE (TOWEL DISPOSABLE) ×2 IMPLANT
TOWEL OR NON WOVEN STRL DISP B (DISPOSABLE) ×2 IMPLANT
TUBE CONNECTING 20X1/4 (TUBING) ×2 IMPLANT
YANKAUER SUCT BULB TIP NO VENT (SUCTIONS) ×2 IMPLANT

## 2015-01-06 NOTE — H&P (Signed)
Carrie Monroe 12/28/2014 9:16 AM Location: [facility] Patient #: [id number] DOB: [birth year] Married / Language: English / Race: White Female  History of Present Illness [name] [name]; 12/31/2014 11:09 AM) Patient words: breast f/u/ MRI.  The patient is a 79 year old female who presents for a follow-up for Breast cancer. The patient is a 79 year old white female who is about 7 years status post left breast lumpectomy for a T1 CN 0 left breast cancer. She was ER and PR positive and HER-2 negative with a Ki-67 of 5%. She was recently found to have a new area of calcification in the subareolar left breast with some skin thickening. The skin thickening has been noted previously and thought to be secondary to her radiation. Since her last visit she also underwent an MRI study in the MRI study failed to visualize any mass or calcification of the left breast.   Allergies ([name], CMA; 12/28/2014 9:16 AM) No Known Drug Allergies07/03/2015  Medication History ([name], CMA; 12/28/2014 9:17 AM) MetFORMIN HCl (1000MG Tablet, Oral) Active. Lipitor (40MG Tablet, Oral) Active. Aspirin (81MG Tablet DR, Oral) Active. Vitamin D (Cholecalciferol) (1000UNIT Capsule, Oral) Active. Tamoxifen Citrate (10MG Tablet, Oral) Active. Medications Reconciled  Review of Systems [name] S. Toth MD; 12/31/2014 11:09 AM) General Not Present- Appetite Loss, Chills, Fatigue, Fever, Night Sweats, Weight Gain and Weight Loss. Skin Not Present- Change in Wart/Mole, Dryness, Hives, Jaundice, New Lesions, Non-Healing Wounds, Rash and Ulcer. HEENT Not Present- Earache, Hearing Loss, Hoarseness, Nose Bleed, Oral Ulcers, Ringing in the Ears, Seasonal Allergies, Sinus Pain, Sore Throat, Visual Disturbances, Wears glasses/contact lenses and Yellow Eyes. Respiratory Not Present- Bloody sputum, Chronic Cough, Difficulty Breathing, Snoring and Wheezing. Breast Present- Skin Changes. Not Present- Breast Mass,  Breast Pain and Nipple Discharge. Cardiovascular Present- Leg Cramps. Not Present- Chest Pain, Difficulty Breathing Lying Down, Palpitations, Rapid Heart Rate, Shortness of Breath and Swelling of Extremities. Gastrointestinal Present- Hemorrhoids. Not Present- Abdominal Pain, Bloating, Bloody Stool, Change in Bowel Habits, Chronic diarrhea, Constipation, Difficulty Swallowing, Excessive gas, Gets full quickly at meals, Indigestion, Nausea, Rectal Pain and Vomiting. Female Genitourinary Not Present- Frequency, Nocturia, Painful Urination, Pelvic Pain and Urgency. Musculoskeletal Not Present- Back Pain, Joint Pain, Joint Stiffness, Muscle Pain, Muscle Weakness and Swelling of Extremities. Neurological Not Present- Decreased Memory, Fainting, Headaches, Numbness, Seizures, Tingling, Tremor, Trouble walking and Weakness. Psychiatric Present- Depression. Not Present- Anxiety, Bipolar, Change in Sleep Pattern, Fearful and Frequent crying. Endocrine Present- Hair Changes. Not Present- Cold Intolerance, Excessive Hunger, Heat Intolerance, Hot flashes and New Diabetes. Hematology Not Present- Easy Bruising, Excessive bleeding, Gland problems, HIV and Persistent Infections.   Vitals ([name] CMA; 12/28/2014 9:16 AM) 12/28/2014 9:16 AM Weight: 221 lb Height: 64in Body Surface Area: 2.13 m Body Mass Index: 37.93 kg/m Temp.: 64F(Temporal)  Pulse: 79 (Regular)  BP: 130/80 (Sitting, Left Arm, Standard)    Physical Exam [name] [name]; 12/31/2014 11:09 AM) General Mental Status-Alert. General Appearance-Consistent with stated age. Hydration-Well hydrated. Voice-Normal.  Head and Neck Head-normocephalic, atraumatic with no lesions or palpable masses. Trachea-midline. Thyroid Gland Characteristics - normal size and consistency.  Eye Eyeball - Bilateral-Extraocular movements intact. Sclera/Conjunctiva - Bilateral-No scleral icterus.  Chest and Lung Exam Chest and  lung exam reveals -quiet, even and easy respiratory effort with no use of accessory muscles and on auscultation, normal breath sounds, no adventitious sounds and normal vocal resonance. Inspection Chest Wall - Normal. Back - normal.  Breast Note: There is some skin thickening to  the left breast which has been present on previous notes from her oncologist. There is no redness to the left breast. There is some generalized thickness in the outer portion of the left breast as well as a palpable seroma cavity in the upper outer left breast. There is no palpable mass in the right breast. There is no palpable axillary, supraclavicular, or cervical lymphadenopathy.   Cardiovascular Cardiovascular examination reveals -normal heart sounds, regular rate and rhythm with no murmurs and normal pedal pulses bilaterally.  Abdomen Inspection Inspection of the abdomen reveals - No Hernias. Skin - Scar - no surgical scars. Palpation/Percussion Palpation and Percussion of the abdomen reveal - Soft, Non Tender, No Rebound tenderness, No Rigidity (guarding) and No hepatosplenomegaly. Auscultation Auscultation of the abdomen reveals - Bowel sounds normal.  Neurologic Neurologic evaluation reveals -alert and oriented x 3 with no impairment of recent or remote memory. Mental Status-Normal.  Musculoskeletal Normal Exam - Left-Upper Extremity Strength Normal and Lower Extremity Strength Normal. Normal Exam - Right-Upper Extremity Strength Normal and Lower Extremity Strength Normal.  Lymphatic Head & Neck  General Head & Neck Lymphatics: Bilateral - Description - Normal. Axillary  General Axillary Region: Bilateral - Description - Normal. Tenderness - Non Tender. Femoral & Inguinal  Generalized Femoral & Inguinal Lymphatics: Bilateral - Description - Normal. Tenderness - Non Tender.    Assessment & Plan [name] [name]; 12/28/2014 9:54 AM) BREAST CALCIFICATION, LEFT (793.89   R92.1) Impression: The patient has some new subareolar left breast calcifications and a history of left breast cancer 7 years ago. These calcifications are not amenable to stereotactic biopsy in her MRI only showed the skin thickening which has been chronic. Because of this I think she will require an open biopsy of the area to make sure that she does not have a recurrence of her cancer. She will also need a skin biopsy done at the same time. I have discussed with her in detail the risks and benefits of the operation to do this as well as some of the technical aspects and she understands and wishes to proceed     Signed by Luella Cook, MD (12/31/2014 11:13 AM)

## 2015-01-06 NOTE — Anesthesia Postprocedure Evaluation (Signed)
  Anesthesia Post-op Note  Patient: Carrie Monroe  Procedure(s) Performed: Procedure(s): BREAST BIOPSY WITH NEEDLE LOCALIZATION AND SKIN BIOPSY (Left)  Patient Location: PACU  Anesthesia Type: General   Level of Consciousness: awake, alert  and oriented  Airway and Oxygen Therapy: Patient Spontanous Breathing  Post-op Pain: none  Post-op Assessment: Post-op Vital signs reviewed  Post-op Vital Signs: Reviewed  Last Vitals:  Filed Vitals:   01/06/15 1215  BP: 155/64  Pulse: 105  Temp:   Resp: 23    Complications: No apparent anesthesia complications

## 2015-01-06 NOTE — Transfer of Care (Signed)
Immediate Anesthesia Transfer of Care Note  Patient: Carrie Monroe  Procedure(s) Performed: Procedure(s): BREAST BIOPSY WITH NEEDLE LOCALIZATION AND SKIN BIOPSY (Left)  Patient Location: PACU  Anesthesia Type:General  Level of Consciousness: awake and patient cooperative  Airway & Oxygen Therapy: Patient Spontanous Breathing and Patient connected to face mask oxygen  Post-op Assessment: Report given to RN and Post -op Vital signs reviewed and stable  Post vital signs: Reviewed and stable  Last Vitals:  Filed Vitals:   01/06/15 1051  BP: 140/59  Pulse:   Temp:   Resp:     Complications: No apparent anesthesia complications

## 2015-01-06 NOTE — Op Note (Signed)
01/06/2015  12:01 PM  PATIENT:  Carrie Monroe  79 y.o. female  PRE-OPERATIVE DIAGNOSIS:  LEFT BREAST CALCIFICATIONS  POST-OPERATIVE DIAGNOSIS:  LEFT BREAST CALCIFICATIONS  PROCEDURE:  Procedure(s): BREAST BIOPSY WITH NEEDLE LOCALIZATION AND SKIN BIOPSY (Left)  SURGEON:  Surgeon(s) and Role:    * Jovita Kussmaul, MD - Primary  PHYSICIAN ASSISTANT:   ASSISTANTS: none   ANESTHESIA:   general  EBL:     BLOOD ADMINISTERED:none  DRAINS: none   LOCAL MEDICATIONS USED:  MARCAINE     SPECIMEN:  Source of Specimen:  left breast tissue  DISPOSITION OF SPECIMEN:  PATHOLOGY  COUNTS:  YES  TOURNIQUET:  * No tourniquets in log *  DICTATION: .Dragon Dictation  After informed consent was obtained the patient was brought to the operating room and placed in the supine position on the operating room table. After adequate induction of general anesthesia the patient's left breast was prepped with ChloraPrep, allowed to dry, and draped in usual sterile manner. Earlier in the day the patient underwent a wire localization procedure and the wire was entering the left breast medially and travels subareolar to mark an area of abnormal calcification. The patient also had thickening of the skin of the left breast. At this point an elliptical incision was made around the wire entry site with a 15 blade knife. The incision was carried through the skin and subcutaneous tissue sharply with electrocautery. An oblong section of breast tissue was excised sharply with the electrocautery around the path of the wire. Once the specimen was removed it was oriented with a short stitch on the superior surface and a long stitch on the lateral surface. The skin was medial. A specimen radiograph was obtained that showed a tremendous number of calcifications in the specimen. The specimen was then sent to pathology for further evaluation. Hemostasis was achieved using the Bovie electrocautery. The wound was infiltrated  with quarter percent Marcaine and irrigated with saline. The deep layer of the wound was closed with interrupted 3-0 Vicryl stitches. The skin was then closed with interrupted 4-0 Monocryl subcuticular stitches. Dermabond dressings were applied. The patient tolerated the procedure well. At the end of the case all needle sponge and instrument counts were correct. The patient was then awakened and taken to recovery in stable condition.  PLAN OF CARE: Discharge to home after PACU  PATIENT DISPOSITION:  PACU - hemodynamically stable.   Delay start of Pharmacological VTE agent (>24hrs) due to surgical blood loss or risk of bleeding: not applicable

## 2015-01-06 NOTE — Anesthesia Procedure Notes (Signed)
Procedure Name: LMA Insertion Date/Time: 01/06/2015 11:25 AM Performed by: Toula Moos L Pre-anesthesia Checklist: Patient identified, Emergency Drugs available, Suction available, Patient being monitored and Timeout performed Patient Re-evaluated:Patient Re-evaluated prior to inductionOxygen Delivery Method: Circle System Utilized Preoxygenation: Pre-oxygenation with 100% oxygen Intubation Type: IV induction Ventilation: Mask ventilation without difficulty LMA: LMA inserted LMA Size: 4.0 Number of attempts: 1 Airway Equipment and Method: Bite block Placement Confirmation: positive ETCO2 Tube secured with: Tape Dental Injury: Teeth and Oropharynx as per pre-operative assessment

## 2015-01-06 NOTE — Interval H&P Note (Signed)
History and Physical Interval Note:  01/06/2015 7:09 AM  Carrie Monroe  has presented today for surgery, with the diagnosis of LEFT BREAST CALCIFICATIONS  The various methods of treatment have been discussed with the patient and family. After consideration of risks, benefits and other options for treatment, the patient has consented to  Procedure(s): BREAST BIOPSY WITH NEEDLE LOCALIZATION AND SKIN BIOPSY (Left) as a surgical intervention .  [name] history has been reviewed, patient examined, no change in status, stable for surgery.  I have reviewed the patient's chart and labs.  Questions were answered to the patient's satisfaction.     TOTH III,PAUL S

## 2015-01-06 NOTE — Anesthesia Preprocedure Evaluation (Signed)
Anesthesia Evaluation  Patient identified by MRN, date of birth, ID band Patient awake    Reviewed: Allergy & Precautions, NPO status , Patient's Chart, lab work & pertinent test results  Airway Mallampati: I  TM Distance: >3 FB Neck ROM: Full    Dental  (+) Teeth Intact, Dental Advisory Given   Pulmonary  breath sounds clear to auscultation        Cardiovascular hypertension, Pt. on medications +CHF + Valvular Problems/Murmurs (Mild AS on 6/16 Echo) AS Rhythm:Regular Rate:Normal + Systolic murmurs (99991111 SEM)    Neuro/Psych    GI/Hepatic   Endo/Other  diabetes, Well Controlled, Type 2, Oral Hypoglycemic AgentsMorbid obesity  Renal/GU      Musculoskeletal   Abdominal   Peds  Hematology   Anesthesia Other Findings   Reproductive/Obstetrics                             Anesthesia Physical Anesthesia Plan  ASA: III  Anesthesia Plan: General   Post-op Pain Management:    Induction: Intravenous  Airway Management Planned: LMA  Additional Equipment:   Intra-op Plan:   Post-operative Plan: Extubation in OR  Informed Consent: I have reviewed the patients History and Physical, chart, labs and discussed the procedure including the risks, benefits and alternatives for the proposed anesthesia with the patient or authorized representative who has indicated his/her understanding and acceptance.   Dental advisory given  Plan Discussed with: CRNA, Anesthesiologist and Surgeon  Anesthesia Plan Comments:         Anesthesia Quick Evaluation

## 2015-01-06 NOTE — Discharge Instructions (Signed)

## 2015-01-07 ENCOUNTER — Encounter (HOSPITAL_BASED_OUTPATIENT_CLINIC_OR_DEPARTMENT_OTHER): Payer: Self-pay | Admitting: General Surgery

## 2015-02-14 ENCOUNTER — Telehealth: Payer: Self-pay | Admitting: Hematology and Oncology

## 2015-02-14 NOTE — Telephone Encounter (Signed)
Patient had called in to reschedule her appointment

## 2015-02-15 ENCOUNTER — Other Ambulatory Visit: Payer: Medicare Other

## 2015-02-15 ENCOUNTER — Ambulatory Visit: Payer: Medicare Other | Admitting: Hematology and Oncology

## 2015-02-22 ENCOUNTER — Other Ambulatory Visit: Payer: Self-pay | Admitting: *Deleted

## 2015-02-22 DIAGNOSIS — C50412 Malignant neoplasm of upper-outer quadrant of left female breast: Secondary | ICD-10-CM

## 2015-02-23 ENCOUNTER — Other Ambulatory Visit (HOSPITAL_BASED_OUTPATIENT_CLINIC_OR_DEPARTMENT_OTHER): Payer: Medicare Other

## 2015-02-23 ENCOUNTER — Ambulatory Visit (HOSPITAL_BASED_OUTPATIENT_CLINIC_OR_DEPARTMENT_OTHER): Payer: Medicare Other | Admitting: Hematology and Oncology

## 2015-02-23 ENCOUNTER — Encounter: Payer: Self-pay | Admitting: Hematology and Oncology

## 2015-02-23 ENCOUNTER — Telehealth: Payer: Self-pay | Admitting: Hematology and Oncology

## 2015-02-23 VITALS — BP 168/59 | HR 66 | Temp 98.1°F | Resp 18 | Ht 63.0 in | Wt 229.2 lb

## 2015-02-23 DIAGNOSIS — C50412 Malignant neoplasm of upper-outer quadrant of left female breast: Secondary | ICD-10-CM | POA: Diagnosis not present

## 2015-02-23 LAB — COMPREHENSIVE METABOLIC PANEL (CC13)
ALT: 10 U/L (ref 0–55)
ANION GAP: 6 meq/L (ref 3–11)
AST: 15 U/L (ref 5–34)
Albumin: 3.4 g/dL — ABNORMAL LOW (ref 3.5–5.0)
Alkaline Phosphatase: 76 U/L (ref 40–150)
BUN: 20.2 mg/dL (ref 7.0–26.0)
CALCIUM: 9.5 mg/dL (ref 8.4–10.4)
CO2: 26 mEq/L (ref 22–29)
CREATININE: 1.3 mg/dL — AB (ref 0.6–1.1)
Chloride: 109 mEq/L (ref 98–109)
EGFR: 40 mL/min/{1.73_m2} — ABNORMAL LOW (ref >90)
Glucose: 193 mg/dl — ABNORMAL HIGH (ref 70–140)
Potassium: 4.3 mEq/L (ref 3.5–5.1)
Sodium: 141 mEq/L (ref 136–145)
Total Bilirubin: 0.56 mg/dL (ref 0.20–1.20)
Total Protein: 6.6 g/dL (ref 6.4–8.3)

## 2015-02-23 LAB — CBC WITH DIFFERENTIAL/PLATELET
BASO%: 0.9 % (ref 0.0–2.0)
BASOS ABS: 0 10*3/uL (ref 0.0–0.1)
EOS ABS: 0.2 10*3/uL (ref 0.0–0.5)
EOS%: 4.8 % (ref 0.0–7.0)
HEMATOCRIT: 33.6 % — AB (ref 34.8–46.6)
HGB: 11.2 g/dL — ABNORMAL LOW (ref 11.6–15.9)
LYMPH#: 1.3 10*3/uL (ref 0.9–3.3)
LYMPH%: 28.9 % (ref 14.0–49.7)
MCH: 31.6 pg (ref 25.1–34.0)
MCHC: 33.2 g/dL (ref 31.5–36.0)
MCV: 95.1 fL (ref 79.5–101.0)
MONO#: 0.3 10*3/uL (ref 0.1–0.9)
MONO%: 8 % (ref 0.0–14.0)
NEUT#: 2.5 10*3/uL (ref 1.5–6.5)
NEUT%: 57.4 % (ref 38.4–76.8)
PLATELETS: 121 10*3/uL — AB (ref 145–400)
RBC: 3.54 10*6/uL — ABNORMAL LOW (ref 3.70–5.45)
RDW: 12.9 % (ref 11.2–14.5)
WBC: 4.3 10*3/uL (ref 3.9–10.3)

## 2015-02-23 NOTE — Progress Notes (Signed)
Patient Care Team: Marton Redwood, MD as PCP - General (Internal Medicine)  DIAGNOSIS: No matching staging information was found for the patient.  SUMMARY OF ONCOLOGIC HISTORY:   Breast cancer of upper-outer quadrant of left female breast   10/28/2007 Initial Diagnosis Breast cancer of upper-outer quadrant of left female breast: Invasive mammary cancer with tubular features low-grade ER 98% PR 10% Ki-67 5% HER-2 negative ratio 1.42: MRI: 1 x 0.8 x 1.1 cm enhancement 3:00 position   11/26/2007 Surgery Left lumpectomy with sentinel lymph node biopsy, 1.2 cm in grade 1, IDC 3 SLN negative margins clear T1 B. N0 M0 stage IA   01/22/2008 - 02/19/2008 Radiation Therapy Radiation therapy to lumpectomy site by Dr. Valere Dross   02/26/2008 -  Anti-estrogen oral therapy Femara then switched to Aromasin then switched to Tamoxifen 20 mg (Dec 2009) daily due to hot flashes   01/06/2015 Surgery Left lumpectomy: Calcified nodule no malignancy identified 01 cm nodule of dense calcifications with focal ossification associated with fibrosis and degenerated fat    CHIEF COMPLIANT: Follow-up of breast cancer and tamoxifen  INTERVAL HISTORY: Carrie Monroe is a 79 year old above-mentioned history of left breast cancer currently on tamoxifen therapy. The plan is to continue tamoxifen for 10 years. She recently had abnormal calcifications in the left breast and underwent a lumpectomy. These turned out to be benign calcifications. Otherwise she feels well without any major problems or concerns. Denies any recent health concerns either. Denies any lumps or nodules in the breasts.  REVIEW OF SYSTEMS:   Constitutional: Denies fevers, chills or abnormal weight loss Eyes: Denies blurriness of vision Ears, nose, mouth, throat, and face: Denies mucositis or sore throat Respiratory: Denies cough, dyspnea or wheezes Cardiovascular: Denies palpitation, chest discomfort or lower extremity swelling Gastrointestinal:  Denies nausea,  heartburn or change in bowel habits Skin: Denies abnormal skin rashes Lymphatics: Denies new lymphadenopathy or easy bruising Neurological:Denies numbness, tingling or new weaknesses Behavioral/Psych: Mood is stable, no new changes  Breast:  Recent lumpectomy All other systems were reviewed with the patient and are negative.  I have reviewed the past medical history, past surgical history, social history and family history with the patient and they are unchanged from previous note.  ALLERGIES:  has No Known Allergies.  MEDICATIONS:  Current Outpatient Prescriptions  Medication Sig Dispense Refill  . DULoxetine (CYMBALTA) 20 MG capsule Take 20 mg by mouth daily.    . metFORMIN (GLUCOPHAGE) 1000 MG tablet Take 1,000 mg by mouth 2 (two) times daily with a meal.    . potassium chloride SA (K-DUR,KLOR-CON) 20 MEQ tablet Take 1 tablet (20 mEq total) by mouth daily. For treatment of low potassium. 90 tablet 1  . tamoxifen (NOLVADEX) 20 MG tablet Take 1 tablet (20 mg total) by mouth every morning. For treatment or prevent breast cancer. 90 tablet 1   No current facility-administered medications for this visit.    PHYSICAL EXAMINATION: ECOG PERFORMANCE STATUS: 1 - Symptomatic but completely ambulatory  Filed Vitals:   02/23/15 1102  BP: 168/59  Pulse: 66  Temp: 98.1 F (36.7 C)  Resp: 18   Filed Weights   02/23/15 1102  Weight: 229 lb 3.2 oz (103.964 kg)    GENERAL:alert, no distress and comfortable SKIN: skin color, texture, turgor are normal, no rashes or significant lesions EYES: normal, Conjunctiva are pink and non-injected, sclera clear OROPHARYNX:no exudate, no erythema and lips, buccal mucosa, and tongue normal  NECK: supple, thyroid normal size, non-tender, without nodularity LYMPH:  no  palpable lymphadenopathy in the cervical, axillary or inguinal LUNGS: clear to auscultation and percussion with normal breathing effort HEART: regular rate & rhythm and no murmurs and no  lower extremity edema ABDOMEN:abdomen soft, non-tender and normal bowel sounds Musculoskeletal:no cyanosis of digits and no clubbing  NEURO: alert & oriented x 3 with fluent speech, no focal motor/sensory deficits  LABORATORY DATA:  I have reviewed the data as listed   Chemistry      Component Value Date/Time   NA 141 02/23/2015 1029   NA 140 01/05/2015 1435   K 4.3 02/23/2015 1029   K 4.6 01/05/2015 1435   CL 108 01/05/2015 1435   CL 111* 08/28/2012 1224   CO2 26 02/23/2015 1029   CO2 22 01/05/2015 1435   BUN 20.2 02/23/2015 1029   BUN 21* 01/05/2015 1435   CREATININE 1.3* 02/23/2015 1029   CREATININE 1.14* 01/05/2015 1435      Component Value Date/Time   CALCIUM 9.5 02/23/2015 1029   CALCIUM 9.7 01/05/2015 1435   ALKPHOS 76 02/23/2015 1029   ALKPHOS 91 07/28/2013 1400   AST 15 02/23/2015 1029   AST 27 07/28/2013 1400   ALT 10 02/23/2015 1029   ALT 25 07/28/2013 1400   BILITOT 0.56 02/23/2015 1029   BILITOT 0.5 07/28/2013 1400       Lab Results  Component Value Date   WBC 4.3 02/23/2015   HGB 11.2* 02/23/2015   HCT 33.6* 02/23/2015   MCV 95.1 02/23/2015   PLT 121* 02/23/2015   NEUTROABS 2.5 02/23/2015   ASSESSMENT & PLAN:  Breast cancer of upper-outer quadrant of left female breast Stage IA breast cancer diagnosed in 2009 ER/PR positive HER-2 negative currently on antiestrogen therapy with tamoxifen tolerating it very well. She is being on antiestrogen therapy since 02/26/2008. Patient wishes to continue this therapy for 10 years  Left Lumpectomy August 2016: Benign breast calcifications  Tamoxifen toxicities: Mild depression    Breast Cancer Surveillance: 1. Breast exam 02/23/2015: Normal 2. Mammogram July 2016 indeterminate left breast calcifications   Left breast calcifications: Status post lumpectomy benign August 2016  Return to clinic in 1 year for continued surveillance and follow-up   No orders of the defined types were placed in this  encounter.   The patient has a good understanding of the overall plan. she agrees with it. she will call with any problems that may develop before the next visit here.   Rulon Eisenmenger, MD

## 2015-02-23 NOTE — Telephone Encounter (Signed)
Gave avs & calendar for September 2017 °

## 2015-02-23 NOTE — Assessment & Plan Note (Signed)
Stage IA breast cancer diagnosed in 2009 ER/PR positive HER-2 negative currently on antiestrogen therapy with tamoxifen tolerating it very well. She is being on antiestrogen therapy since 02/26/2008. Patient wishes to continue this therapy for 10 years   Tamoxifen toxicities: 1. Decreased libido 2. Mild depression    Breast Cancer Surveillance: 1. Breast exam 02/23/2015: Normal 2. Mammogram July 2016 indeterminate left breast calcifications   Left breast calcifications: Status post lumpectomy benign August 2016  Return to clinic in 1 year for continued surveillance and follow-up

## 2015-03-16 ENCOUNTER — Other Ambulatory Visit: Payer: Self-pay | Admitting: Cardiology

## 2015-03-16 NOTE — Telephone Encounter (Signed)
Okay to refill one time. Needs OV

## 2015-03-16 NOTE — Telephone Encounter (Signed)
Please advise on refill as patient was due to follow up 03/2014.

## 2015-05-06 ENCOUNTER — Other Ambulatory Visit: Payer: Self-pay | Admitting: Cardiology

## 2015-06-18 ENCOUNTER — Encounter: Payer: Self-pay | Admitting: Gastroenterology

## 2015-06-22 ENCOUNTER — Other Ambulatory Visit: Payer: Self-pay

## 2015-06-22 DIAGNOSIS — C50412 Malignant neoplasm of upper-outer quadrant of left female breast: Secondary | ICD-10-CM

## 2015-06-22 MED ORDER — TAMOXIFEN CITRATE 20 MG PO TABS: 20.0000 mg | ORAL_TABLET | ORAL | Status: DC

## 2015-06-23 ENCOUNTER — Telehealth: Payer: Self-pay

## 2015-06-23 NOTE — Telephone Encounter (Signed)
-----   Message from Garret Reddish, [place] sent at 06/23/2015 9:23 AM EST -----    Patient requesting refill of potassium. Ok to refill potassium chloride 20 meq?    Pt overdue for follow up. Spoke with pt and scheduled her to see Dr. Mare Ferrari tomorrow at 10:15AM. Advised pt to let CMA know when here tomorrow that she needs K+ refilled. Pt verbalized understanding and was in agreement with this plan.

## 2015-06-24 ENCOUNTER — Ambulatory Visit (INDEPENDENT_AMBULATORY_CARE_PROVIDER_SITE_OTHER): Payer: Medicare Other | Admitting: Cardiology

## 2015-06-24 ENCOUNTER — Encounter: Payer: Self-pay | Admitting: Cardiology

## 2015-06-24 VITALS — BP 130/64 | HR 80 | Ht 63.0 in | Wt 233.8 lb

## 2015-06-24 DIAGNOSIS — I350 Nonrheumatic aortic (valve) stenosis: Secondary | ICD-10-CM

## 2015-06-24 DIAGNOSIS — I119 Hypertensive heart disease without heart failure: Secondary | ICD-10-CM | POA: Diagnosis not present

## 2015-06-24 DIAGNOSIS — R0989 Other specified symptoms and signs involving the circulatory and respiratory systems: Secondary | ICD-10-CM | POA: Diagnosis not present

## 2015-06-24 MED ORDER — POTASSIUM CHLORIDE CRYS ER 20 MEQ PO TBCR: EXTENDED_RELEASE_TABLET | ORAL | Status: DC

## 2015-06-24 NOTE — Patient Instructions (Signed)
Medication Instructions:  Your physician recommends that you continue on your current medications as directed. Please refer to the Current Medication list given to you today.   Labwork: none  Testing/Procedures: Your physician has requested that you have a carotid duplex. This test is an ultrasound of the carotid arteries in your neck. It looks at blood flow through these arteries that supply the brain with blood. Allow one hour for this exam. There are no restrictions or special instructions.  Your physician has requested that you have an echocardiogram. Echocardiography is a painless test that uses sound waves to create images of your heart. It provides your doctor with information about the size and shape of your heart and how well your heart's chambers and valves are working. This procedure takes approximately one hour. There are no restrictions for this procedure.    Follow-Up: Your physician wants you to follow-up in: 6 months with Dr. Sallyanne Kuster. You will receive a reminder letter in the mail two months in advance. If you don't receive a letter, please call our office to schedule the follow-up appointment.   Any Other Special Instructions Will Be Listed Below (If Applicable).     If you need a refill on your cardiac medications before your next appointment, please call your pharmacy.

## 2015-06-24 NOTE — Progress Notes (Signed)
Cardiology Office Note   Date:  06/24/2015   ID:  Carrie Monroe, DOB 11/20/1935, MRN [id number]  PCP:  Marton Redwood, MD  Cardiologist: Darlin Coco MD  Chief Complaint  Patient presents with  . scheduled follow up    CAD. Denies any chest pain, shortness of breath, le edema, or claudication      History of Present Illness: Carrie Monroe is a 80 y.o. female who presents for a six-month follow-up visit  This pleasant 80 year old married Caucasian female is seen for a scheduled office visit. He has a past history of essential hypertension, hypercholesterolemia, exogenous obesity, diabetes, and aortic stenosis. The patient has a past history of breast cancer. She had lumpectomy and radiation therapy to the left breast 4 years ago. Since we last saw her she underwent left total knee replacement by Dr. Wynelle Link. Postoperatively she went to a rehabilitation facility before returning home. The patient has a history of diastolic heart failure. If she fails to take her Lasix she experiences shortness of breath. She previously had also complained of some chest discomfort which resolved after her heart failure improved. The patient had a Lexi scan Myoview stress test on 07/14/13 showing an ejection fraction of 60% with no wall motion abnormalities and a small scar in the basal anterolateral area with minimal peri-infarct ischemia. The patient has known mild aortic stenosis.The patient had an echocardiogram on 11/03/12. This showed that she has mild aortic stenosis with a peak gradient of 33 and a mean gradient of 21. She has normal LV function with ejection fraction 60%. The patient has a harsh systolic murmur at the base radiating to the neck. She underwent carotid duplex on 07/22/13 showing moderate heterogeneous plaque bilaterally with 60-79% bilateral internal carotid artery stenosis and patent vertebral arteries with antegrade flow. The patient has not been having any TIA symptoms.  She is on baby aspirin daily.  The patient has a history of morbid obesity.  She has lost about 40 pounds altogether on the [employer].  Past Medical History  Diagnosis Date  . Hypertension   . Hyperlipidemia   . Depression   . Anxiety   . Exogenous obesity   . Aortic stenosis   . Bilateral lower extremity edema   . CHF (congestive heart failure) ([place])   . Heart murmur     hx mild aortic stenosis  . Carotid artery stenosis     bilateral  . Arthritis     oa left knee  . Shortness of breath     if she does not take her lasix and with exertion  . Chest pain on exertion   . Gout   . Diabetes mellitus     DIET CONTROL  . Cancer [facility])     left breast CANCER, LUMPECTOMY AND RADIATION  . Breast cancer ([place])   . Leg cramps     BILATERAL  . Decreased range of motion     RIGHT ARM - HX OF SHOULDER SURGERY AND LIMITED ABILTIY TO RAISE RT ARM ABOVE HEAD  . Hemorrhoids   . History of kidney stones   . Shingles     YRS AGO - NO RESIDUAL PROBLEMS    Past Surgical History  Procedure Laterality Date  . Breast lumpectomy  2009    left  . Cholecystectomy  2010  . Shoulder surgery      right  . Knee surgery      right PARTIAL KNEE REPLACEMENT  . US echocardiography  05/15/2010    EF 60-65%  . Skin graft to right hand      AFTER BURN TO THE HAND  . Eye surgery      BILATERAL CATARACT EXTRACTIONS AND LENS IMPLANTS  . Total knee arthroplasty Left 08/03/2013    Procedure: TOTAL LEFT KNEE ARTHROPLASTY;  Surgeon: Gearlean Alf, MD;  Location: [facility];  Service: Orthopedics;  Laterality: Left;  . Breast lumpectomy with needle localization Left 01/06/2015    Procedure: BREAST BIOPSY WITH NEEDLE LOCALIZATION AND SKIN BIOPSY;  Surgeon: Autumn Messing III, MD;  Location: [facility];  Service: General;  Laterality: Left;     Current Outpatient Prescriptions  Medication Sig Dispense Refill  . buPROPion (WELLBUTRIN XL) 150 MG 24 hr tablet Take 150 mg by mouth daily.  0  .  DULoxetine (CYMBALTA) 30 MG capsule Take 30 mg by mouth daily. Decreasing dose to wean off. Will start Wellbutrin in about a week.  0  . metFORMIN (GLUCOPHAGE) 1000 MG tablet Take 1,000 mg by mouth 2 (two) times daily with a meal.    . potassium chloride SA (K-DUR,KLOR-CON) 20 MEQ tablet Take 1 by mouth daily for low potassium. Call the office for refills, no more until seen 90 tablet 3  . tamoxifen (NOLVADEX) 20 MG tablet Take 1 tablet (20 mg total) by mouth every morning. For treatment or prevent breast cancer. 90 tablet 2   No current facility-administered medications for this visit.    Allergies:   Review of patient's allergies indicates no known allergies.    Social History:  The patient  reports that she has never smoked. She has never used smokeless tobacco. She reports that she does not drink alcohol or use illicit drugs.   Family History:  [name] family history includes Cancer in her brother and mother; Heart disease in her father; Hypertension in her father.    ROS:  Please see the history of present illness.   Otherwise, review of systems are positive for none.   All other systems are reviewed and negative.    PHYSICAL EXAM: VS:  BP 130/64 mmHg  Pulse 80  Ht 5\' 3"  (1.6 m)  Wt 233 lb 12.8 oz (106.051 kg)  BMI 41.43 kg/m2 , BMI Body mass index is 41.43 kg/(m^2). GEN: Well nourished, well developed, in no acute distress HEENT: normal Neck: no JVD, there is a loud left carotid bruit Cardiac: RRR; there is a grade 3/6 harsh systolic ejection murmur heard widely across the precordium but loudest at the base Respiratory:  clear to auscultation bilaterally, normal work of breathing GI: soft, nontender, nondistended, + BS MS: no deformity or atrophy Skin: warm and dry, no rash Neuro:  Strength and sensation are intact Psych: euthymic mood, full affect   EKG:  EKG is not ordered today.   Recent Labs: 02/23/2015: ALT 10; BUN 20.2; Creatinine 1.3*; HGB 11.2*; Platelets  121*; Potassium 4.3; Sodium 141    Lipid Panel No results found for: CHOL, TRIG, HDL, CHOLHDL, VLDL, LDLCALC, LDLDIRECT    Wt Readings from Last 3 Encounters:  06/24/15 233 lb 12.8 oz (106.051 kg)  02/23/15 229 lb 3.2 oz (103.964 kg)  01/06/15 227 lb (102.967 kg)       ASSESSMENT AND PLAN:  1. Hypertensive heart disease without heart failure 2. mild aortic stenosis 3. diabetes mellitus type II 4. Bilateral carotid internal artery stenosis 60-79%. 5. history of gout 6. History of hypercholesterolemia 7. history of postop anemia 8. osteoarthritis, status post  left total knee replacement. 9. morbid obesity   Current medicines are reviewed at length with the patient today.  The patient does not have concerns regarding medicines.  The following changes have been made:  no change  Labs/ tests ordered today include:   Orders Placed This Encounter  Procedures  . ECHOCARDIOGRAM COMPLETE     Disposition:  The patient will continue current medication.  We will update her 2-D echo and her carotid Dopplers.  She will return following my retirement to see Dr. Sallyanne Kuster in 6 months  Signed, Darlin Coco MD 06/24/2015 1:50 PM    [address], [place], [place]  [address] Phone: [phone]; Fax: [phone]

## 2015-07-01 ENCOUNTER — Ambulatory Visit (HOSPITAL_COMMUNITY)
Admission: RE | Admit: 2015-07-01 | Discharge: 2015-07-01 | Disposition: A | Discharge status: Home / Self Care | Payer: Medicare Other | Source: Ambulatory Visit | Attending: Cardiology | Admitting: Cardiology

## 2015-07-01 DIAGNOSIS — E669 Obesity, unspecified: Secondary | ICD-10-CM | POA: Diagnosis not present

## 2015-07-01 DIAGNOSIS — I10 Essential (primary) hypertension: Secondary | ICD-10-CM | POA: Insufficient documentation

## 2015-07-01 DIAGNOSIS — R0989 Other specified symptoms and signs involving the circulatory and respiratory systems: Secondary | ICD-10-CM

## 2015-07-01 DIAGNOSIS — Z6841 Body Mass Index (BMI) 40.0 and over, adult: Secondary | ICD-10-CM | POA: Insufficient documentation

## 2015-07-01 DIAGNOSIS — I059 Rheumatic mitral valve disease, unspecified: Secondary | ICD-10-CM | POA: Insufficient documentation

## 2015-07-01 DIAGNOSIS — I350 Nonrheumatic aortic (valve) stenosis: Secondary | ICD-10-CM | POA: Diagnosis present

## 2015-07-01 DIAGNOSIS — I517 Cardiomegaly: Secondary | ICD-10-CM | POA: Diagnosis not present

## 2015-07-01 DIAGNOSIS — E785 Hyperlipidemia, unspecified: Secondary | ICD-10-CM | POA: Insufficient documentation

## 2015-07-01 DIAGNOSIS — I6523 Occlusion and stenosis of bilateral carotid arteries: Secondary | ICD-10-CM | POA: Diagnosis not present

## 2015-10-03 ENCOUNTER — Telehealth: Payer: Self-pay

## 2015-10-03 NOTE — Telephone Encounter (Signed)
Letter dated July 28, 2015, from [facility]. Approval for KCL ER 20 Meq tabs at Tier 1. Good through 07/26/2016.

## 2015-12-09 ENCOUNTER — Other Ambulatory Visit: Payer: Self-pay | Admitting: General Surgery

## 2015-12-09 DIAGNOSIS — Z1231 Encounter for screening mammogram for malignant neoplasm of breast: Secondary | ICD-10-CM

## 2016-01-06 ENCOUNTER — Ambulatory Visit
Admission: RE | Admit: 2016-01-06 | Discharge: 2016-01-06 | Disposition: A | Discharge status: Home / Self Care | Payer: Medicare Other | Source: Ambulatory Visit | Attending: General Surgery | Admitting: General Surgery

## 2016-01-06 DIAGNOSIS — Z1231 Encounter for screening mammogram for malignant neoplasm of breast: Secondary | ICD-10-CM

## 2016-01-25 ENCOUNTER — Ambulatory Visit (INDEPENDENT_AMBULATORY_CARE_PROVIDER_SITE_OTHER): Payer: Medicare Other | Admitting: Cardiovascular Disease

## 2016-01-25 ENCOUNTER — Encounter: Payer: Self-pay | Admitting: Cardiovascular Disease

## 2016-01-25 VITALS — BP 158/73 | HR 71 | Ht 64.0 in | Wt 246.0 lb

## 2016-01-25 DIAGNOSIS — I6521 Occlusion and stenosis of right carotid artery: Secondary | ICD-10-CM

## 2016-01-25 DIAGNOSIS — E1169 Type 2 diabetes mellitus with other specified complication: Secondary | ICD-10-CM

## 2016-01-25 DIAGNOSIS — R9439 Abnormal result of other cardiovascular function study: Secondary | ICD-10-CM

## 2016-01-25 DIAGNOSIS — E119 Type 2 diabetes mellitus without complications: Secondary | ICD-10-CM

## 2016-01-25 DIAGNOSIS — I350 Nonrheumatic aortic (valve) stenosis: Secondary | ICD-10-CM

## 2016-01-25 DIAGNOSIS — I10 Essential (primary) hypertension: Secondary | ICD-10-CM | POA: Diagnosis not present

## 2016-01-25 DIAGNOSIS — I5032 Chronic diastolic (congestive) heart failure: Secondary | ICD-10-CM | POA: Diagnosis not present

## 2016-01-25 DIAGNOSIS — E669 Obesity, unspecified: Secondary | ICD-10-CM

## 2016-01-25 DIAGNOSIS — R931 Abnormal findings on diagnostic imaging of heart and coronary circulation: Secondary | ICD-10-CM

## 2016-01-25 NOTE — Progress Notes (Signed)
Cardiology Office Note    Date:  01/27/2016   ID:  Carrie Monroe, DOB 1935-10-22, MRN [id number]  PCP:  Marton Redwood, MD  Cardiologist:   Sanda Klein, MD   Chief Complaint  Patient presents with  . New Patient (Initial Visit)    here to get estab.    History of Present Illness:  Carrie Monroe is a 80 y.o. female here to establish cardiology care after Dr. Sherryl Barters retirement. She has aortic stenosis, chronic diastolic heart failure, hypertension, hyperlipidemia, obesity, diabetes mellitus and remote history of left breast surgery and radiation therapy for cancer roughly 4 years ago.  She describes fatigue, but thinks this is because she is the only caregiver for her husband who is ill and housebound. She denies exertional dyspnea or angina and has never experienced syncope or near syncope. She does not have leg edema and denies intermittent claudication.  Her last echocardiogram February 2017 shows normal left ventricular systolic function with ejection fraction of 55-60% and mild left ventricular hypertrophy. There was moderate to severe aortic stenosis with a mean gradient of 37 mmHg, peak gradient 65 mmHg and estimated valve area of 1.25 cm. Her left atrium was moderately dilated. Doppler findings showing considerable turbulence in the aortic arch and ascending thoracic aorta is mentioned, possibly some concern about coarctation.  2 years ago her nuclear stress test suggested a very small scar in the basal anterolateral wall with minimal peri-infarct ischemia. She underwent knee replacement surgery and postsurgical rehabilitation without a problem last year. Carotid duplex ultrasonography performed February 2017 showed 60-79 percent right carotid stenosis and less than 40% left carotid stenosis.  Past Medical History:  Diagnosis Date  . Anxiety   . Aortic stenosis   . Arthritis    oa left knee  . Bilateral lower extremity edema   . Breast cancer ([place])   .  Cancer [facility])    left breast CANCER, LUMPECTOMY AND RADIATION  . Carotid artery stenosis    bilateral  . Chest pain on exertion   . CHF (congestive heart failure) ([place])   . Decreased range of motion    RIGHT ARM - HX OF SHOULDER SURGERY AND LIMITED ABILTIY TO RAISE RT ARM ABOVE HEAD  . Depression   . Diabetes mellitus    DIET CONTROL  . Exogenous obesity   . Gout   . Heart murmur    hx mild aortic stenosis  . Hemorrhoids   . History of kidney stones   . Hyperlipidemia   . Hypertension   . Leg cramps    BILATERAL  . Shingles    YRS AGO - NO RESIDUAL PROBLEMS  . Shortness of breath    if she does not take her lasix and with exertion  . Stenosis of right carotid artery 01/27/2016    Past Surgical History:  Procedure Laterality Date  . BREAST LUMPECTOMY  2009   left  . BREAST LUMPECTOMY WITH NEEDLE LOCALIZATION Left 01/06/2015   Procedure: BREAST BIOPSY WITH NEEDLE LOCALIZATION AND SKIN BIOPSY;  Surgeon: Autumn Messing III, MD;  Location: [place] [facility];  Service: General;  Laterality: Left;  . CHOLECYSTECTOMY  2010  . EYE SURGERY     BILATERAL CATARACT EXTRACTIONS AND LENS IMPLANTS  . KNEE SURGERY     right PARTIAL KNEE REPLACEMENT  . SHOULDER SURGERY     right  . SKIN GRAFT TO RIGHT HAND     AFTER BURN TO THE HAND  . TOTAL KNEE ARTHROPLASTY Left  08/03/2013   Procedure: TOTAL LEFT KNEE ARTHROPLASTY;  Surgeon: Gearlean Alf, MD;  Location[facility];  Service: Orthopedics;  Laterality: Left;  . US ECHOCARDIOGRAPHY  05/15/2010   EF 60-65%    Current Medications: Outpatient Medications Prior to Visit  Medication Sig Dispense Refill  . metFORMIN (GLUCOPHAGE) 1000 MG tablet Take 1,000 mg by mouth 2 (two) times daily with a meal.    . tamoxifen (NOLVADEX) 20 MG tablet Take 1 tablet (20 mg total) by mouth every morning. For treatment or prevent breast cancer. 90 tablet 2  . buPROPion (WELLBUTRIN XL) 150 MG 24 hr tablet Take 150 mg by mouth daily.  0  . DULoxetine  (CYMBALTA) 30 MG capsule Take 30 mg by mouth daily. Decreasing dose to wean off. Will start Wellbutrin in about a week.  0  . potassium chloride SA (K-DUR,KLOR-CON) 20 MEQ tablet Take 1 by mouth daily for low potassium. Call the office for refills, no more until seen 90 tablet 3   No facility-administered medications prior to visit.      Allergies:   Review of patient's allergies indicates no known allergies.   Social History   Social History  . Marital status: Married    Spouse name: N/A  . Number of children: N/A  . Years of education: N/A   Social History Main Topics  . Smoking status: Never Smoker  . Smokeless tobacco: Never Used  . Alcohol use No  . Drug use: No  . Sexual activity: Not Currently   Other Topics Concern  . None   Social History Narrative   Epworth Sleepiness scale score =11 as of 01/25/16     Family History:  [name] family history includes Cancer in her brother and mother; Diabetes in her sister; Heart disease in her father; Hypertension in her father.   ROS:   Please see the history of present illness.    ROS All other systems reviewed and are negative.   PHYSICAL EXAM:   VS:  BP (!) 158/73 (BP Location: Right Arm, Patient Position: Sitting, Cuff Size: Large)   Pulse 71   Ht 5\' 4"  (1.626 m)   Wt 246 lb (111.6 kg)   BMI 42.23 kg/m    GEN: Morbid obesity limits physical exam, well developed, in no acute distress  HEENT: normal  Neck: no JVD, carotid bruits, or masses Cardiac: RRR; Mid peaking 3/6 systolic ejection murmur in the aortic focus, no diastolic murmurs, rubs, or gallops,no edema  Respiratory:  clear to auscultation bilaterally, normal work of breathing GI: soft, nontender, nondistended, + BS MS: no deformity or atrophy  Skin: warm and dry, no rash Neuro:  Alert and Oriented x 3, Strength and sensation are intact Psych: euthymic mood, full affect  Wt Readings from Last 3 Encounters:  01/25/16 246 lb (111.6 kg)  06/24/15 233 lb  12.8 oz (106.1 kg)  02/23/15 229 lb 3.2 oz (104 kg)      Studies/Labs Reviewed:   EKG:  EKG is ordered today.  The ekg ordered today demonstrates Sinus rhythm, LVH with voltage partially masked by obesity, mild repolarization abnormalities  Recent Labs: 02/23/2015: ALT 10; BUN 20.2; Creatinine 1.3; HGB 11.2; Platelets 121; Potassium 4.3; Sodium 141   Lipid Panel No results found for: CHOL, TRIG, HDL, CHOLHDL, VLDL, LDLCALC, LDLDIRECT  Additional studies/ records that were reviewed today include:  Echo, carotid duplex ultrasound images. Dr. Sherryl Barters last several notes.    ASSESSMENT:    1. Chronic diastolic heart failure (  [place])   2. Aortic stenosis, moderate   3. Essential hypertension   4. Abnormal nuclear stress test   5. Diabetes mellitus type 2 in obese ([facility])   6. Stenosis of right carotid artery   7. Morbid obesity due to excess calories ([place])      PLAN:  In order of problems listed above:  1. CHF: Appears clinically compensated, euvolemic. Suspect left ventricular hypertrophy is due to both hypertension and aortic stenosis 2. AS: Recent echo has shown substantial worsening of transvalvular gradients. Discussed warning symptoms of exertional angina/dyspnea/syncope which should prompt further evaluation. Would like to repeat an echo in 6 months since the most recent studies showed unusually rapid progression 3. HTN: Good blood pressure control usually, slightly elevated blood pressure today, probably attributable to seeing a new physician. 4. Abnormal stress test: It is just as likely that the fixed defect on her nuclear test was artifactual, rather than scar. As long as she is asymptomatic further evaluation is not needed. She will eventually require coronary angiography when her aortic valve stenosis progresses to the point that she needs surgical intervention. 5. DM: Reports good control. Needs to get her lipid profile. 6. Right carotid stenosis: Asymptomatic. Needs to  take aspirin and aggressively treat risk factors. 7. Obesity: Now is the time to start losing weight and getting into good physical shape, to permit easier recovery when she requires aortic valve intervention in the next several years. Epworth Sleepiness Scale is abnormal and suggests possible need for further evaluation for obstructive sleep apnea.    Medication Adjustments/Labs and Tests Ordered: Current medicines are reviewed at length with the patient today.  Concerns regarding medicines are outlined above.  Medication changes, Labs and Tests ordered today are listed in the Patient Instructions below. Patient Instructions  Medication Instructions: Dr Sallyanne Kuster recommends that you continue on your current medications as directed. Please refer to the Current Medication list given to you today.  Labwork: NONE ORDERED  Testing/Procedures: 1. Echocardiogram - Your physician has requested that you have an echocardiogram. Echocardiography is a painless test that uses sound waves to create images of your heart. It provides your doctor with information about the size and shape of your heart and how well your heart's chambers and valves are working. This procedure takes approximately one hour. There are no restrictions for this procedure. This will be performed at our [facility] location - [address], [address].  Follow-up: Dr Sallyanne Kuster recommends that you schedule a follow-up appointment in 6 months. You will receive a reminder letter in the mail two months in advance. If you don't receive a letter, please call our office to schedule the follow-up appointment.  If you need a refill on your cardiac medications before your next appointment, please call your pharmacy.    Signed, Sanda Klein, MD  01/27/2016 10:16 AM    [address], [place], [place]  [address] Phone: [phone]; Fax: [phone]

## 2016-01-25 NOTE — Patient Instructions (Signed)
Medication Instructions: Dr Sallyanne Kuster recommends that you continue on your current medications as directed. Please refer to the Current Medication list given to you today.  Labwork: NONE ORDERED  Testing/Procedures: 1. Echocardiogram - Your physician has requested that you have an echocardiogram. Echocardiography is a painless test that uses sound waves to create images of your heart. It provides your doctor with information about the size and shape of your heart and how well your heart's chambers and valves are working. This procedure takes approximately one hour. There are no restrictions for this procedure. This will be performed at our [facility] location - [address].  Follow-up: Dr Sallyanne Kuster recommends that you schedule a follow-up appointment in 6 months. You will receive a reminder letter in the mail two months in advance. If you don't receive a letter, please call our office to schedule the follow-up appointment.  If you need a refill on your cardiac medications before your next appointment, please call your pharmacy.

## 2016-01-27 ENCOUNTER — Encounter: Payer: Self-pay | Admitting: Cardiovascular Disease

## 2016-01-27 DIAGNOSIS — I6521 Occlusion and stenosis of right carotid artery: Secondary | ICD-10-CM | POA: Insufficient documentation

## 2016-01-27 DIAGNOSIS — R9439 Abnormal result of other cardiovascular function study: Secondary | ICD-10-CM | POA: Insufficient documentation

## 2016-01-27 DIAGNOSIS — I10 Essential (primary) hypertension: Secondary | ICD-10-CM | POA: Insufficient documentation

## 2016-02-08 ENCOUNTER — Other Ambulatory Visit: Payer: Self-pay

## 2016-02-08 ENCOUNTER — Ambulatory Visit (HOSPITAL_COMMUNITY): Payer: Medicare Other | Attending: Cardiovascular Disease

## 2016-02-08 DIAGNOSIS — I5032 Chronic diastolic (congestive) heart failure: Secondary | ICD-10-CM | POA: Diagnosis not present

## 2016-02-08 DIAGNOSIS — I110 Hypertensive heart disease with heart failure: Secondary | ICD-10-CM | POA: Insufficient documentation

## 2016-02-08 DIAGNOSIS — I340 Nonrheumatic mitral (valve) insufficiency: Secondary | ICD-10-CM | POA: Diagnosis not present

## 2016-02-08 DIAGNOSIS — I350 Nonrheumatic aortic (valve) stenosis: Secondary | ICD-10-CM | POA: Insufficient documentation

## 2016-02-08 DIAGNOSIS — E119 Type 2 diabetes mellitus without complications: Secondary | ICD-10-CM | POA: Diagnosis not present

## 2016-02-09 ENCOUNTER — Telehealth: Payer: Self-pay

## 2016-02-09 DIAGNOSIS — I350 Nonrheumatic aortic (valve) stenosis: Secondary | ICD-10-CM

## 2016-02-09 NOTE — Telephone Encounter (Signed)
-----   Message from Sanda Klein, MD sent at 02/08/2016  5:08 PM EDT ----- Compared with February there has not been any further progression of aortic stenosis which remains moderate to severe. Please repeat in 12 months. Ask her to promptly report exertional dyspnea, exertional angina or exertional syncope, please.

## 2016-02-18 ENCOUNTER — Encounter (HOSPITAL_COMMUNITY): Payer: Self-pay | Admitting: Emergency Medicine

## 2016-02-18 ENCOUNTER — Ambulatory Visit (HOSPITAL_COMMUNITY)
Admission: EM | Admit: 2016-02-18 | Discharge: 2016-02-18 | Disposition: A | Discharge status: Home / Self Care | Payer: Medicare Other | Attending: Internal Medicine | Admitting: Internal Medicine

## 2016-02-18 DIAGNOSIS — M25571 Pain in right ankle and joints of right foot: Secondary | ICD-10-CM

## 2016-02-18 DIAGNOSIS — W5501XA Bitten by cat, initial encounter: Secondary | ICD-10-CM | POA: Diagnosis not present

## 2016-02-18 MED ORDER — AMOXICILLIN-POT CLAVULANATE 875-125 MG PO TABS: 1.0000 | ORAL_TABLET | Freq: Two times a day (BID) | ORAL | 0 refills | Status: DC

## 2016-02-18 NOTE — ED Provider Notes (Signed)
CSN: [id number]     Arrival date & time 02/18/16  1202 History   First MD Initiated Contact with Patient 02/18/16 1307     Chief Complaint  Patient presents with  . Animal Bite   (Consider location/radiation/quality/duration/timing/severity/associated sxs/prior Treatment) 80 year old female states that she has had a cat for over a year and that it often tends to follow her around and bite her on her ankles. She has several small pinpoint healing scars to both ankles. She has developed pain to the right ankle over the past 6-7 days. There is minor swelling to the medial aspect. Denies any injury. Denies history of infection from previous cat bites. She states her cat is up-to-date on all immunizations.      Past Medical History:  Diagnosis Date  . Anxiety   . Aortic stenosis   . Arthritis    oa left knee  . Bilateral lower extremity edema   . Breast cancer ([place])   . Cancer [facility])    left breast CANCER, LUMPECTOMY AND RADIATION  . Carotid artery stenosis    bilateral  . Chest pain on exertion   . CHF (congestive heart failure) ([place])   . Decreased range of motion    RIGHT ARM - HX OF SHOULDER SURGERY AND LIMITED ABILTIY TO RAISE RT ARM ABOVE HEAD  . Depression   . Diabetes mellitus    DIET CONTROL  . Exogenous obesity   . Gout   . Heart murmur    hx mild aortic stenosis  . Hemorrhoids   . History of kidney stones   . Hyperlipidemia   . Hypertension   . Leg cramps    BILATERAL  . Shingles    YRS AGO - NO RESIDUAL PROBLEMS  . Shortness of breath    if she does not take her lasix and with exertion  . Stenosis of right carotid artery 01/27/2016   Past Surgical History:  Procedure Laterality Date  . BREAST LUMPECTOMY  2009   left  . BREAST LUMPECTOMY WITH NEEDLE LOCALIZATION Left 01/06/2015   Procedure: BREAST BIOPSY WITH NEEDLE LOCALIZATION AND SKIN BIOPSY;  Surgeon: Autumn Messing III, MD;  Location: [facility];  Service: General;  Laterality: Left;  .  CHOLECYSTECTOMY  2010  . EYE SURGERY     BILATERAL CATARACT EXTRACTIONS AND LENS IMPLANTS  . KNEE SURGERY     right PARTIAL KNEE REPLACEMENT  . SHOULDER SURGERY     right  . SKIN GRAFT TO RIGHT HAND     AFTER BURN TO THE HAND  . TOTAL KNEE ARTHROPLASTY Left 08/03/2013   Procedure: TOTAL LEFT KNEE ARTHROPLASTY;  Surgeon: Gearlean Alf, MD;  Location[facility];  Service: Orthopedics;  Laterality: Left;  . US ECHOCARDIOGRAPHY  05/15/2010   EF 60-65%   Family History  Problem Relation Age of Onset  . Cancer Mother     pancreatic  . Heart disease Father   . Hypertension Father   . Cancer Brother   . Diabetes Sister    Social History  Substance Use Topics  . Smoking status: Never Smoker  . Smokeless tobacco: Never Used  . Alcohol use No   OB History    No data available     Review of Systems  Constitutional: Negative.  Negative for chills and fever.  HENT: Negative.   Gastrointestinal: Negative.   Musculoskeletal: Negative.   Skin: Positive for wound.  Neurological: Negative.   Hematological: Negative.   All other systems reviewed and  are negative.   Allergies  Review of patient's allergies indicates no known allergies.  Home Medications   Prior to Admission medications   Medication Sig Start Date End Date Taking? Authorizing Provider  lisinopril (PRINIVIL,ZESTRIL) 20 MG tablet Take 1 tablet by mouth daily. 12/20/15  Yes Historical Provider, MD  metFORMIN (GLUCOPHAGE) 1000 MG tablet Take 1,000 mg by mouth 2 (two) times daily with a meal.   Yes Historical Provider, MD  tamoxifen (NOLVADEX) 20 MG tablet Take 1 tablet (20 mg total) by mouth every morning. For treatment or prevent breast cancer. 06/22/15  Yes Nicholas Lose, MD  amoxicillin-clavulanate (AUGMENTIN) 875-125 MG tablet Take 1 tablet by mouth every 12 (twelve) hours. 02/18/16   Janne Napoleon, NP  aspirin EC 81 MG tablet Take 81 mg by mouth once a week.    Historical Provider, MD  rosuvastatin (CRESTOR) 10 MG tablet  Take 1 tablet by mouth daily. 01/17/16   Historical Provider, MD  VITAMIN D, ERGOCALCIFEROL, PO Take 1 tablet by mouth daily.    Historical Provider, MD   Meds Ordered and Administered this Visit  Medications - No data to display  BP 151/85 (BP Location: Right Arm)   Pulse 78   Temp 97.9 F (36.6 C) (Oral)   Resp 16   SpO2 98%  No data found.   Physical Exam  Constitutional: She appears well-developed and well-nourished. No distress.  Neck: Neck supple.  Cardiovascular: Normal rate.   Pulmonary/Chest: Effort normal.  Musculoskeletal: Normal range of motion.  Minor puffiness to the medial aspect of the right ankle. There are several pinpoint healing scab-like lesions to both ankles. No appreciable erythema. Positive for tenderness to the medial aspect of the right ankle. The pain is constant and is not worse with weightbearing or ambulation. No other skin changes. No papules, vesicles or cellulitis. No drainage or bleeding.  Nursing note and vitals reviewed.   Urgent Care Course   Clinical Course    Procedures (including critical care time)  Labs Review Labs Reviewed - No data to display  Imaging Review No results found.   Visual Acuity Review  Right Eye Distance:   Left Eye Distance:   Bilateral Distance:    Right Eye Near:   Left Eye Near:    Bilateral Near:         MDM   1. Cat bite, initial encounter    Continue to wash the area of bite with warm soap and water daily and after each bite. Keep the area clean. Apply warm compresses. Elevate as needed. Take medication as directed. Seek medical attn promtply if worse. Meds ordered this encounter  Medications  . amoxicillin-clavulanate (AUGMENTIN) 875-125 MG tablet    Sig: Take 1 tablet by mouth every 12 (twelve) hours.    Dispense:  14 tablet    Refill:  0    Order Specific Question:   Supervising Provider    Answer:   Sherlene Shams [[id number]]  Patient states she believes she is up-to-date on tetanus  however she will call her PCP on Monday to make sure. She understands that if she has not had one in less than 5 years that she will obtain one.    Janne Napoleon, NP 02/18/16 1325

## 2016-02-18 NOTE — Discharge Instructions (Signed)
Continue to wash the area of bite with warm soap and water daily and after each bite. Keep the area clean. Apply warm compresses. Elevate as needed. Take medication as directed. For any worsening such as deep redness, more swelling or pain seek medical attention promptly.

## 2016-02-18 NOTE — ED Triage Notes (Signed)
Pt reports her cat has been "nipping" at her foot over a year now and now reports mult bite marks to right foot   Sx today include: swelling and pain  States cat is UTD w/vaccinations  A&O x4... Steady gait.Marland Kitchen NAD

## 2016-02-23 ENCOUNTER — Ambulatory Visit (HOSPITAL_BASED_OUTPATIENT_CLINIC_OR_DEPARTMENT_OTHER): Payer: Medicare Other | Admitting: Hematology and Oncology

## 2016-02-23 ENCOUNTER — Encounter: Payer: Self-pay | Admitting: Hematology and Oncology

## 2016-02-23 DIAGNOSIS — Z170 Estrogen receptor positive status [ER+]: Secondary | ICD-10-CM

## 2016-02-23 DIAGNOSIS — R921 Mammographic calcification found on diagnostic imaging of breast: Secondary | ICD-10-CM | POA: Diagnosis not present

## 2016-02-23 DIAGNOSIS — C50412 Malignant neoplasm of upper-outer quadrant of left female breast: Secondary | ICD-10-CM | POA: Diagnosis not present

## 2016-02-23 NOTE — Assessment & Plan Note (Signed)
Stage IA breast cancer diagnosed in 2009 ER/PR positive HER-2 negative currently on antiestrogen therapy with tamoxifen tolerating it very well. She is being on antiestrogen therapy since 02/26/2008. Patient wishes to continue this therapy for 10 years  Left Lumpectomy August 2016: Benign breast calcifications  Tamoxifen toxicities: Mild depression    Breast Cancer Surveillance: 1. Breast exam 02/23/2016: Normal 2. Mammogram July 2017 benign, breast density category B  Left breast calcifications: Status post lumpectomy benign August 2016  Return to clinic in 1 year for continued surveillance and follow-up

## 2016-02-23 NOTE — Progress Notes (Signed)
Patient Care Team: Marton Redwood, MD as PCP - General (Internal Medicine)  DIAGNOSIS: No matching staging information was found for the patient.  SUMMARY OF ONCOLOGIC HISTORY:   Breast cancer of upper-outer quadrant of left female breast ([place])   10/28/2007 Initial Diagnosis    Breast cancer of upper-outer quadrant of left female breast: Invasive mammary cancer with tubular features low-grade ER 98% PR 10% Ki-67 5% HER-2 negative ratio 1.42: MRI: 1 x 0.8 x 1.1 cm enhancement 3:00 position      11/26/2007 Surgery    Left lumpectomy with sentinel lymph node biopsy, 1.2 cm in grade 1, IDC 3 SLN negative margins clear T1 B. N0 M0 stage IA      01/22/2008 - 02/19/2008 Radiation Therapy    Radiation therapy to lumpectomy site by Dr. Valere Dross      02/26/2008 -  Anti-estrogen oral therapy    Femara then switched to Aromasin then switched to Tamoxifen 20 mg (Dec 2009) daily due to hot flashes      01/06/2015 Surgery    Left lumpectomy: Calcified nodule no malignancy identified 01 cm nodule of dense calcifications with focal ossification associated with fibrosis and degenerated fat       CHIEF COMPLIANT: Follow-up on tamoxifen therapy  INTERVAL HISTORY: Carrie Monroe is a 80 year old with above-mentioned history of left breast cancer currently on adjuvant tamoxifen. She is tolerating tamoxifen fairly well. She tells me that her cat has been biting her feet and she has had frequent infections. She denies any problems tolerating tamoxifen. She denies any lumps or nodules. She has intermittent discomfort in the breast.  REVIEW OF SYSTEMS:   Constitutional: Denies fevers, chills or abnormal weight loss Eyes: Denies blurriness of vision Ears, nose, mouth, throat, and face: Denies mucositis or sore throat Respiratory: Denies cough, dyspnea or wheezes Cardiovascular: Denies palpitation, chest discomfort Gastrointestinal:  Denies nausea, heartburn or change in bowel habits Skin: Denies abnormal  skin rashes Lymphatics: Denies new lymphadenopathy or easy bruising Neurological:Denies numbness, tingling or new weaknesses Behavioral/Psych: Mood is stable, no new changes  Extremities: No lower extremity edema Breast:  intermittent left breast pain  All other systems were reviewed with the patient and are negative.  I have reviewed the past medical history, past surgical history, social history and family history with the patient and they are unchanged from previous note.  ALLERGIES:  has No Known Allergies.  MEDICATIONS:  Current Outpatient Prescriptions  Medication Sig Dispense Refill  . amoxicillin-clavulanate (AUGMENTIN) 875-125 MG tablet Take 1 tablet by mouth every 12 (twelve) hours. 14 tablet 0  . aspirin EC 81 MG tablet Take 81 mg by mouth once a week.    Marland Kitchen lisinopril (PRINIVIL,ZESTRIL) 20 MG tablet Take 1 tablet by mouth daily.  2  . metFORMIN (GLUCOPHAGE) 1000 MG tablet Take 1,000 mg by mouth 2 (two) times daily with a meal.    . rosuvastatin (CRESTOR) 10 MG tablet Take 1 tablet by mouth daily.  0  . tamoxifen (NOLVADEX) 20 MG tablet Take 1 tablet (20 mg total) by mouth every morning. For treatment or prevent breast cancer. 90 tablet 2  . VITAMIN D, ERGOCALCIFEROL, PO Take 1 tablet by mouth daily.     No current facility-administered medications for this visit.     PHYSICAL EXAMINATION: ECOG PERFORMANCE STATUS: 1 - Symptomatic but completely ambulatory  Vitals:   02/23/16 1104  BP: (!) 152/51  Pulse: 61  Resp: 18  Temp: 98.3 F (36.8 C)   Filed Weights  02/23/16 1104  Weight: 246 lb 11.2 oz (111.9 kg)    GENERAL:alert, no distress and comfortable SKIN: skin color, texture, turgor are normal, no rashes or significant lesions EYES: normal, Conjunctiva are pink and non-injected, sclera clear OROPHARYNX:no exudate, no erythema and lips, buccal mucosa, and tongue normal  NECK: supple, thyroid normal size, non-tender, without nodularity LYMPH:  no palpable  lymphadenopathy in the cervical, axillary or inguinal LUNGS: clear to auscultation and percussion with normal breathing effort HEART: regular rate & rhythm and no murmurs and no lower extremity edema ABDOMEN:abdomen soft, non-tender and normal bowel sounds MUSCULOSKELETAL:no cyanosis of digits and no clubbing  NEURO: alert & oriented x 3 with fluent speech, no focal motor/sensory deficits EXTREMITIES: No lower extremity edema BREAST: Left breast lymphedema. There is a seroma on the upper outer quadrant. Scar tissue from prior surgery and radiation. No palpable lumps or nodules in the right breast or axilla. (exam performed in the presence of a chaperone)  LABORATORY DATA:  I have reviewed the data as listed   Chemistry      Component Value Date/Time   NA 141 02/23/2015 1029   K 4.3 02/23/2015 1029   CL 108 01/05/2015 1435   CL 111 (H) 08/28/2012 1224   CO2 26 02/23/2015 1029   BUN 20.2 02/23/2015 1029   CREATININE 1.3 (H) 02/23/2015 1029      Component Value Date/Time   CALCIUM 9.5 02/23/2015 1029   ALKPHOS 76 02/23/2015 1029   AST 15 02/23/2015 1029   ALT 10 02/23/2015 1029   BILITOT 0.56 02/23/2015 1029       Lab Results  Component Value Date   WBC 4.3 02/23/2015   HGB 11.2 (L) 02/23/2015   HCT 33.6 (L) 02/23/2015   MCV 95.1 02/23/2015   PLT 121 (L) 02/23/2015   NEUTROABS 2.5 02/23/2015     ASSESSMENT & PLAN:  Breast cancer of upper-outer quadrant of left female breast Stage IA breast cancer diagnosed in 2009 ER/PR positive HER-2 negative currently on antiestrogen therapy with tamoxifen tolerating it very well. She is being on antiestrogen therapy since 02/26/2008. Patient wishes to continue this therapy for 10 years  Left Lumpectomy August 2016: Benign breast calcifications  Tamoxifen toxicities: Mild depression    Breast Cancer Surveillance: 1. Breast exam 02/23/2016: Normal 2. Mammogram July 2017 benign, breast density category B  Left breast  calcifications: Status post lumpectomy benign August 2016  Return to clinic in 1 year for continued surveillance and follow-up   No orders of the defined types were placed in this encounter.  The patient has a good understanding of the overall plan. she agrees with it. she will call with any problems that may develop before the next visit here.   Rulon Eisenmenger, MD 02/23/16

## 2016-03-24 ENCOUNTER — Other Ambulatory Visit: Payer: Self-pay | Admitting: Hematology and Oncology

## 2016-03-24 DIAGNOSIS — C50412 Malignant neoplasm of upper-outer quadrant of left female breast: Secondary | ICD-10-CM

## 2016-03-26 ENCOUNTER — Other Ambulatory Visit: Payer: Self-pay

## 2016-06-29 ENCOUNTER — Encounter (HOSPITAL_COMMUNITY): Payer: Self-pay | Admitting: Radiology

## 2016-08-15 ENCOUNTER — Encounter: Payer: Self-pay | Admitting: Cardiovascular Disease

## 2016-09-04 ENCOUNTER — Other Ambulatory Visit: Payer: Self-pay

## 2016-09-04 ENCOUNTER — Ambulatory Visit (HOSPITAL_COMMUNITY): Payer: Medicare Other | Attending: Internal Medicine

## 2016-09-04 DIAGNOSIS — I080 Rheumatic disorders of both mitral and aortic valves: Secondary | ICD-10-CM | POA: Insufficient documentation

## 2016-09-04 DIAGNOSIS — I110 Hypertensive heart disease with heart failure: Secondary | ICD-10-CM | POA: Insufficient documentation

## 2016-09-04 DIAGNOSIS — Z6841 Body Mass Index (BMI) 40.0 and over, adult: Secondary | ICD-10-CM | POA: Diagnosis not present

## 2016-09-04 DIAGNOSIS — I350 Nonrheumatic aortic (valve) stenosis: Secondary | ICD-10-CM | POA: Diagnosis present

## 2016-09-04 DIAGNOSIS — E785 Hyperlipidemia, unspecified: Secondary | ICD-10-CM | POA: Insufficient documentation

## 2016-09-04 DIAGNOSIS — I509 Heart failure, unspecified: Secondary | ICD-10-CM | POA: Insufficient documentation

## 2016-09-04 DIAGNOSIS — E669 Obesity, unspecified: Secondary | ICD-10-CM | POA: Diagnosis not present

## 2016-09-04 DIAGNOSIS — E119 Type 2 diabetes mellitus without complications: Secondary | ICD-10-CM | POA: Insufficient documentation

## 2016-09-04 DIAGNOSIS — Z853 Personal history of malignant neoplasm of breast: Secondary | ICD-10-CM | POA: Insufficient documentation

## 2016-10-01 ENCOUNTER — Ambulatory Visit (INDEPENDENT_AMBULATORY_CARE_PROVIDER_SITE_OTHER): Payer: Medicare Other | Admitting: Cardiovascular Disease

## 2016-10-01 ENCOUNTER — Encounter: Payer: Self-pay | Admitting: Cardiovascular Disease

## 2016-10-01 VITALS — BP 130/60 | HR 90 | Ht 64.0 in | Wt 257.0 lb

## 2016-10-01 DIAGNOSIS — I10 Essential (primary) hypertension: Secondary | ICD-10-CM

## 2016-10-01 DIAGNOSIS — I350 Nonrheumatic aortic (valve) stenosis: Secondary | ICD-10-CM | POA: Diagnosis not present

## 2016-10-01 DIAGNOSIS — E669 Obesity, unspecified: Secondary | ICD-10-CM | POA: Diagnosis not present

## 2016-10-01 DIAGNOSIS — I6521 Occlusion and stenosis of right carotid artery: Secondary | ICD-10-CM

## 2016-10-01 DIAGNOSIS — Z9289 Personal history of other medical treatment: Secondary | ICD-10-CM | POA: Diagnosis not present

## 2016-10-01 DIAGNOSIS — E1169 Type 2 diabetes mellitus with other specified complication: Secondary | ICD-10-CM

## 2016-10-01 DIAGNOSIS — I5032 Chronic diastolic (congestive) heart failure: Secondary | ICD-10-CM | POA: Diagnosis not present

## 2016-10-01 NOTE — Patient Instructions (Signed)
Dr Sallyanne Kuster has referred you to Dr Sherren Mocha to discuss TAVR.  Your physician recommends that you schedule a follow-up appointment in 6 months.  If you need a refill on your cardiac medications before your next appointment, please call your pharmacy.

## 2016-10-01 NOTE — Progress Notes (Addendum)
Cardiology Office Note    Date:  10/02/2016   ID:  Carrie Monroe, DOB [birth year], MRN [id number]  PCP:  Marton Redwood, MD  Cardiologist:   Sanda Klein, MD   Chief Complaint  Patient presents with  . Follow-up    pt c/o DOE    History of Present Illness:  Carrie Monroe is a 81 y.o. female with aortic stenosis, chronic diastolic heart failure, hypertension, hyperlipidemia, obesity, diabetes mellitus and remote history of left breast surgery and radiation therapy for cancer roughly 4 years ago.  She is accompanied here today by her daughter.  Since her appointment a year ago she has developed exertional dyspnea. She becomes short of breath walking the roughly 50 feet from her den to her bedroom. She is very sedentary and rarely engages in any social activities. She is easily short of breath when she goes shopping with her daughter. She has had a few attacks of gout involving her great toe and ankles. She has also noticed some mild ankle edema, but has only taken furosemide roughly once every 3 months. Denies exertional angina or syncope.  She remains the only caregiver for her husband who is ill and housebound.   Her last echocardiogram April 2018 shows normal left ventricular systolic function with ejection fraction of 60-65% and moderate left ventricular hypertrophy. There was severe aortic stenosis with a mean gradient of 46 mmHg, peak gradient 81 mmHg and estimated valve area of 0.72 cm, substantially worsened from 1 year before. Her left atrium was moderately dilated.   In February 2015, her nuclear stress test suggested a very small scar in the basal anterolateral wall with minimal peri-infarct ischemia. She underwent knee replacement surgery and postsurgical rehabilitation without a problem in 2016. Carotid duplex ultrasonography performed February 2017 showed 60-79% right carotid stenosis and less than 40% left carotid stenosis.  Past Medical History:  Diagnosis  Date  . Anxiety   . Aortic stenosis   . Arthritis    oa left knee  . Bilateral lower extremity edema   . Breast cancer ([place])   . Cancer [facility])    left breast CANCER, LUMPECTOMY AND RADIATION  . Carotid artery stenosis    bilateral  . Chest pain on exertion   . CHF (congestive heart failure) ([place])   . Decreased range of motion    RIGHT ARM - HX OF SHOULDER SURGERY AND LIMITED ABILTIY TO RAISE RT ARM ABOVE HEAD  . Depression   . Diabetes mellitus    DIET CONTROL  . Exogenous obesity   . Gout   . Heart murmur    hx mild aortic stenosis  . Hemorrhoids   . History of kidney stones   . Hyperlipidemia   . Hypertension   . Leg cramps    BILATERAL  . Shingles    YRS AGO - NO RESIDUAL PROBLEMS  . Shortness of breath    if she does not take her lasix and with exertion  . Stenosis of right carotid artery 01/27/2016    Past Surgical History:  Procedure Laterality Date  . BREAST LUMPECTOMY  2009   left  . BREAST LUMPECTOMY WITH NEEDLE LOCALIZATION Left 01/06/2015   Procedure: BREAST BIOPSY WITH NEEDLE LOCALIZATION AND SKIN BIOPSY;  Surgeon: Autumn Messing III, MD;  Location: [facility];  Service: General;  Laterality: Left;  . CHOLECYSTECTOMY  2010  . EYE SURGERY     BILATERAL CATARACT EXTRACTIONS AND LENS IMPLANTS  . KNEE SURGERY  right PARTIAL KNEE REPLACEMENT  . SHOULDER SURGERY     right  . SKIN GRAFT TO RIGHT HAND     AFTER BURN TO THE HAND  . TOTAL KNEE ARTHROPLASTY Left 08/03/2013   Procedure: TOTAL LEFT KNEE ARTHROPLASTY;  Surgeon: Gearlean Alf, MD;  [facility];  Service: Orthopedics;  Laterality: Left;  . US ECHOCARDIOGRAPHY  05/15/2010   EF 60-65%    Current Medications: Outpatient Medications Prior to Visit  Medication Sig Dispense Refill  . lisinopril (PRINIVIL,ZESTRIL) 20 MG tablet Take 1 tablet by mouth daily.  2  . metFORMIN (GLUCOPHAGE) 1000 MG tablet Take 1,000 mg by mouth 2 (two) times daily with a meal.    . rosuvastatin (CRESTOR) 10  MG tablet Take 1 tablet by mouth daily.  0  . tamoxifen (NOLVADEX) 20 MG tablet Take 1 tablet by mouth every morning. 90 tablet 3  . VITAMIN D, ERGOCALCIFEROL, PO Take 2,000 Units by mouth daily.     Marland Kitchen amoxicillin-clavulanate (AUGMENTIN) 875-125 MG tablet Take 1 tablet by mouth every 12 (twelve) hours. 14 tablet 0  . aspirin EC 81 MG tablet Take 81 mg by mouth once a week.     No facility-administered medications prior to visit.      Allergies:   Patient has no known allergies.   Social History   Social History  . Marital status: Married    Spouse name: N/A  . Number of children: N/A  . Years of education: N/A   Social History Main Topics  . Smoking status: Never Smoker  . Smokeless tobacco: Never Used  . Alcohol use No  . Drug use: No  . Sexual activity: Not Currently   Other Topics Concern  . None   Social History Narrative   [place] Sleepiness scale score =11 as of 01/25/16     Family History:  [name] family history includes Cancer in her brother and mother; Diabetes in her sister; Heart disease in her father; Hypertension in her father.   ROS:   Please see the history of present illness.    ROS All other systems reviewed and are negative.   PHYSICAL EXAM:   VS:  BP 130/60   Pulse 90   Ht 5\' 4"  (1.626 m)   Wt 116.6 kg (257 lb)   BMI 44.11 kg/m    GEN: Morbid obesity limits physical exam, well developed, in no acute distress  HEENT: normal  Neck: no JVD, carotid bruits, or masses Cardiac: RRR; Mid peaking 3/6 systolic ejection murmur in the aortic focus, no diastolic murmurs, rubs, or gallops,no edema  Respiratory:  clear to auscultation bilaterally, normal work of breathing GI: soft, nontender, nondistended, + BS MS: no deformity or atrophy  Skin: warm and dry, no rash Neuro:  Alert and Oriented x 3, Strength and sensation are intact Psych: euthymic mood, full affect  Wt Readings from Last 3 Encounters:  10/01/16 116.6 kg (257 lb)  02/23/16 111.9  kg (246 lb 11.2 oz)  01/25/16 111.6 kg (246 lb)      Studies/Labs Reviewed:   EKG:  EKG is ordered today.  The ekg ordered today demonstrates Sinus rhythm, LVH with voltage partially masked by obesity, mild repolarization abnormalities   ASSESSMENT:    1. Aortic valve stenosis, severe   2. Chronic diastolic heart failure ([place])   3. Essential hypertension   4. History of nuclear stress test   5. Diabetes mellitus type 2 in obese ([facility])   6. Stenosis of right  carotid artery   7. Morbid obesity due to excess calories ([place])      PLAN:  In order of problems listed above:  1. Carrie Monroe her last appointment she has developed NYHA functional class II-III a exertional symptoms. She also has mild evidence of hypervolemia on physical exam. 2. AS: Recent echo has shown progression to severe stenosis. Together with development of clinical symptoms consistent with aortic stenosis suggest that she will soon need intervention. We discussed SAVR versus TAVR. Due to her age, history of radiation to the chest, but especially her poor functional status and obesity, I think she has intermediate surgical risk and is better suited for the less invasive approach. I suggested a valve clinic visit with Dr. Burt Knack or Dr. Angelena Form. She is reluctant, but I told her that the workup process is lengthy and we should start now, since she is symptomatic. 3. HTN: Good blood pressure control. Stop hydrochlorothiazide due to worsening gout. We'll not replace it with an alternative antihypertensive medication in view of the severe aortic stenosis.  4. Abnormal stress test:  May represent artifact. She will require coronary angiography anyway.  5. DM: Reports good control.  6. Right carotid stenosis: Asymptomatic. On aspirin and statin, no TIA/CVA. 7. Obesity: Together with her history of chest radiation, age and severe deconditioning, increases her surgical risk with aortic valve replacement..     Medication  Adjustments/Labs and Tests Ordered: Current medicines are reviewed at length with the patient today.  Concerns regarding medicines are outlined above.  Medication changes, Labs and Tests ordered today are listed in the Patient Instructions below. Patient Instructions  Dr Sallyanne Kuster has referred you to Dr Sherren Mocha to discuss TAVR.  Your physician recommends that you schedule a follow-up appointment in 6 months.  If you need a refill on your cardiac medications before your next appointment, please call your pharmacy.    Signed, Sanda Klein, MD  10/02/2016 8:45 AM    [facility] [place], [place], [place]  [address] Phone: [phone]; Fax: [phone]

## 2016-10-08 ENCOUNTER — Institutional Professional Consult (permissible substitution): Payer: Medicare Other | Admitting: Cardiovascular Disease

## 2016-10-10 ENCOUNTER — Ambulatory Visit (INDEPENDENT_AMBULATORY_CARE_PROVIDER_SITE_OTHER): Payer: Medicare Other | Admitting: Cardiovascular Disease

## 2016-10-10 ENCOUNTER — Encounter: Payer: Self-pay | Admitting: Cardiovascular Disease

## 2016-10-10 VITALS — BP 146/76 | HR 77 | Ht 64.0 in | Wt 252.2 lb

## 2016-10-10 DIAGNOSIS — I350 Nonrheumatic aortic (valve) stenosis: Secondary | ICD-10-CM

## 2016-10-10 NOTE — Progress Notes (Signed)
Valve Clinic Consult Note  Referring: Croituro  History of Present Illness: 81 yo female with history of chronic diastolic CHF, HTN, HLD, DM, gout, breast cancer and aortic stenosis who is referred today by Dr. Recardo Evangelist to discuss her aortic valve stenosis and possible TAVR. She had been followed for years by Dr. Mare Ferrari and most recently by Dr. Recardo Evangelist. She is known to have aortic stenosis for years. Most recent echo 09/04/16 with normal LV systolic function, severe aortic stenosis with calcified leaflets, restricted leaflet motion, mean gradient 46 mmHg, peak gradient 81 mmHg, AVA 0.72 cm2. She has been having worsened dyspnea with exertion for the past 6 months. She has mild LE edema. No chest pain. No dizziness, near syncope or syncope. Energy level is down some. She is fairly sedentary but she does her housework, drives and does her own cooking. She is married. 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   Primary Care Physician: Marton Redwood, MD Primary cardiology: Dr. Recardo Evangelist  Past Medical History:  Diagnosis Date  . Anxiety   . Aortic stenosis   . Arthritis    oa left knee  . Bilateral lower extremity edema   . Breast cancer ([place])   . Cancer [facility])    left breast CANCER, LUMPECTOMY AND RADIATION  . Carotid artery stenosis    bilateral  . Chest pain on exertion   . CHF (congestive heart failure) ([place])   . Decreased range of motion    RIGHT ARM - HX OF SHOULDER SURGERY AND LIMITED ABILTIY TO RAISE RT ARM ABOVE HEAD  . Depression   . Diabetes mellitus    DIET CONTROL  . Exogenous obesity   . Gout   . Heart murmur    hx mild aortic stenosis  . Hemorrhoids   . History of kidney stones   . Hyperlipidemia   . Hypertension   . Leg cramps    BILATERAL  . Shingles    YRS AGO - NO RESIDUAL PROBLEMS  . Shortness of breath    if she does not take her lasix and with exertion  . Stenosis of right carotid artery 01/27/2016    Past Surgical History:  Procedure Laterality  Date  . BREAST LUMPECTOMY  2009   left  . BREAST LUMPECTOMY WITH NEEDLE LOCALIZATION Left 01/06/2015   Procedure: BREAST BIOPSY WITH NEEDLE LOCALIZATION AND SKIN BIOPSY;  Surgeon: Autumn Messing III, MD;  Location: [facility];  Service: General;  Laterality: Left;  . CHOLECYSTECTOMY  2010  . EYE SURGERY     BILATERAL CATARACT EXTRACTIONS AND LENS IMPLANTS  . KNEE SURGERY     right PARTIAL KNEE REPLACEMENT  . SHOULDER SURGERY     right  . SKIN GRAFT TO RIGHT HAND     AFTER BURN TO THE HAND  . TOTAL KNEE ARTHROPLASTY Left 08/03/2013   Procedure: TOTAL LEFT KNEE ARTHROPLASTY;  Surgeon: Gearlean Alf, MD;  [facility];  Service: Orthopedics;  Laterality: Left;  . US ECHOCARDIOGRAPHY  05/15/2010   EF 60-65%    Current Outpatient Prescriptions  Medication Sig Dispense Refill  . allopurinol (ZYLOPRIM) 300 MG tablet Take 300 mg by mouth daily as needed (gout).    . furosemide (LASIX) 40 MG tablet Take 40 mg by mouth as needed.  2  . lisinopril (PRINIVIL,ZESTRIL) 20 MG tablet Take 1 tablet by mouth daily.  2  . metFORMIN (GLUCOPHAGE) 1000 MG tablet Take 1,000 mg by mouth daily.     Marland Kitchen  rosuvastatin (CRESTOR) 10 MG tablet Take 1 tablet by mouth daily.  0  . tamoxifen (NOLVADEX) 20 MG tablet Take 1 tablet by mouth every morning. 90 tablet 3  . VITAMIN D, ERGOCALCIFEROL, PO Take 2,000 Units by mouth daily.      No current facility-administered medications for this visit.     No Known Allergies  Social History   Social History  . Marital status: Married    Spouse name: N/A  . Number of children: 4  . Years of education: N/A   Occupational History  . 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 Smoking status: Never Smoker  . Smokeless tobacco: Never Used  . Alcohol use No  . Drug use: No  . Sexual activity: Not Currently   Other Topics Concern  . Not on file   Social History Narrative   Epworth Sleepiness scale score =11 as of 01/25/16     Family History  Problem Relation Age of Onset  . Cancer Mother        pancreatic  . Heart disease Father   . Hypertension Father   . Cancer Brother   . Diabetes Sister     Review of Systems:  As stated in the HPI and otherwise negative.   BP (!) 146/76   Pulse 77   Ht 5\' 4"  (1.626 m)   Wt 252 lb 3.2 oz (114.4 kg)   SpO2 95%   BMI 43.29 kg/m   Physical Examination: General: Well developed, well nourished, NAD  HEENT: OP clear, mucus membranes moist  SKIN: warm, dry. No rashes. Neuro: No focal deficits  Musculoskeletal: Muscle strength 5/5 all ext  Psychiatric: Mood and affect normal  Neck: No JVD, no carotid bruits, no thyromegaly, no lymphadenopathy.  Lungs:Clear bilaterally, no wheezes, rhonci, crackles Cardiovascular: Regular rate and rhythm. Loud harsh systolic murmur. No gallops or rubs. Abdomen:Soft. Bowel sounds present. Non-tender.  Extremities: No lower extremity edema. Pulses are 2 + in the bilateral DP/PT.  Echo 09/04/16: Left ventricle: The cavity size was normal. Wall thickness was   increased in a pattern of moderate LVH. Systolic function was   normal. The estimated ejection fraction was in the range of 60%   to 65%. Wall motion was normal; there were no regional wall   motion abnormalities. Doppler parameters are consistent with   pseudonormal left ventricular relaxation (grade 2 diastolic   dysfunction). LV filling pressure is elevated. - Aortic valve: Moderately calcified leaflets. Severe stenosis.   Mean gradient (S): 46 mm Hg. Peak gradient (S): 81 mm Hg. Valve   area (Vmax): 0.72 cm^2. - Mitral valve: Posterior calcified annulus. Mildly thickened   leaflets . - Left atrium: Moderately dilated. - Pulmonic valve: There was mild regurgitation. - Inferior vena cava: The vessel was dilated. The respirophasic   diameter changes were blunted (< 50%), consistent with elevated   central venous pressure.  Impressions:  - Compared to a prior study  in 2017, there is now severe aortic   valve stenosis. AVA around 0.7 cm2 - mean gradient of 46 mmHg.   LVEF is stable at 60-65%.  EKG:  EKG is not ordered today. The ekg ordered today demonstrates   Recent Labs: No results found for requested labs within last 8760 hours.   Lipid Panel No results found for: CHOL, TRIG, HDL, CHOLHDL, VLDL, LDLCALC, LDLDIRECT   Wt Readings from Last 3 Encounters:  10/10/16 252 lb 3.2 oz (114.4 kg)  10/01/16 257  lb (116.6 kg)  02/23/16 246 lb 11.2 oz (111.9 kg)     Other studies Reviewed: Additional studies/ records that were reviewed today include: . Review of the above records demonstrates:   Assessment and Plan:   1. Severe aortic valve stenosis: She has stage D symptomatic aortic valve stenosis. I have personally reviewed the echo images. The aortic valve is thickened, calcified with limited leaflet mobility. I think she would benefit from AVR. Given advanced age, she is not a good candidate for conventional AVR by surgical approach. I think she may be a good candidate for TAVR. She is a very functional 81 yo patient. I have reviewed the TAVR procedure in detail today with the patient and her family. She would like to proceed with planning for TAVR after she has time to think it over. She will call back to arrange cardiac cath. If she decides to hold off, I will plan on seeing her back in 2 months to discuss further.    Current medicines are reviewed at length with the patient today.  The patient does not have concerns regarding medicines.  The following changes have been made:  no change  Labs/ tests ordered today include:  No orders of the defined types were placed in this encounter.  Disposition:   FU with me in 8 weeks  Signed, Lauree Chandler, MD 10/10/2016 2:24 PM    [facility] [place], [place], [place]  [address] Phone: [phone]; Fax: [phone]

## 2016-10-10 NOTE — Patient Instructions (Signed)
Medication Instructions:  Your physician recommends that you continue on your current medications as directed. Please refer to the Current Medication list given to you today.   Labwork: none  Testing/Procedures: none  Follow-Up: Your physician recommends that you schedule a follow-up appointment in: July.  Scheduled for July 27,2018 at 10:00    Any Other Special Instructions Will Be Listed Below (If Applicable).     If you need a refill on your cardiac medications before your next appointment, please call your pharmacy.

## 2016-10-29 ENCOUNTER — Telehealth: Payer: Self-pay | Admitting: Cardiovascular Disease

## 2016-10-29 NOTE — Telephone Encounter (Signed)
I spoke with pt and she would like to proceed with cardiac cath.  She is asking how long she would be at hospital after procedure.  I explained time frame for cath only and time frame if she had intervention done. He daughters will be out of town from 6/19-7/2.  Pt is available for cath next week but states she is not in a hurry to have procedure done and would be fine to wait until after family returns on 7/2

## 2016-10-29 NOTE — Telephone Encounter (Signed)
Carrie Monroe is calling to get the appt schedule for the valve replacement

## 2016-10-30 NOTE — Telephone Encounter (Signed)
Can we arrange for her to come back and see me to discuss her cath? Carrie Monroe

## 2016-10-30 NOTE — Telephone Encounter (Signed)
I reviewed with Dr. Angelena Form and he would like to see pt back in office to schedule cath and have lab work drawn.  I spoke with pt and she would like to wait until after 7/2 when her daughters will be back in town.  I scheduled pt to see Dr. Angelena Form on 12/06/16 at 4:00

## 2016-11-01 ENCOUNTER — Telehealth: Payer: Self-pay | Admitting: Cardiovascular Disease

## 2016-11-01 NOTE — Telephone Encounter (Signed)
New Message     Please call she would like to speak to you about her next appt

## 2016-11-01 NOTE — Telephone Encounter (Signed)
Pt states sister is flying in to assist her while she is going through the process for TAVR. Pt wanted to know an approximate time from it would be from consultation with Dr. Angelena Form to recovery from TAVR so sister can plan how long to be here.  Advised I will send message to Dr. Camillia Herter nurse to follow up with pt as I am unsure of timing.  Pt appreciative for call.

## 2016-11-12 NOTE — Telephone Encounter (Signed)
I spoke with pt and explained steps in TAVR process to her.  I told her I could not give an exact time for TAVR but that it is usually several weeks after catheterization.  She is planning on being here for appointment on 7/12 to discuss and arrange catheterization.

## 2016-11-16 ENCOUNTER — Encounter: Payer: Self-pay | Admitting: *Deleted

## 2016-12-06 ENCOUNTER — Encounter: Payer: Self-pay | Admitting: Cardiovascular Disease

## 2016-12-06 ENCOUNTER — Encounter: Payer: Self-pay | Admitting: *Deleted

## 2016-12-06 ENCOUNTER — Ambulatory Visit (INDEPENDENT_AMBULATORY_CARE_PROVIDER_SITE_OTHER): Payer: Medicare Other | Admitting: Cardiovascular Disease

## 2016-12-06 VITALS — BP 153/70 | HR 93 | Ht 64.0 in | Wt 251.8 lb

## 2016-12-06 DIAGNOSIS — I350 Nonrheumatic aortic (valve) stenosis: Secondary | ICD-10-CM | POA: Diagnosis not present

## 2016-12-06 NOTE — Progress Notes (Signed)
Valve Clinic Note  Referring: Croituro  History of Present Illness: 81 yo female with history of chronic diastolic CHF, HTN, HLD, DM, gout, breast cancer and severe aortic stenosis who is here today to discuss TAVR. I saw her as a new patient 10/10/16. She was referred by Dr. Recardo Evangelist to discuss her aortic valve stenosis and possible TAVR. She had been followed for years by Dr. Mare Ferrari and most recently by Dr. Recardo Evangelist. She is known to have aortic stenosis for several years. Most recent echo 09/04/16 with normal LV systolic function, severe aortic stenosis with calcified leaflets, restricted leaflet motion, mean gradient 46 mmHg, peak gradient 81 mmHg, AVA 0.72 cm2. She has been having worsened dyspnea with exertion for the past 6 months with mild LE edema. No chest pain. No dizziness, near syncope or syncope. Energy level is down some. She is fairly sedentary but she does her housework, drives and does her own cooking. She is married. 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   At her first visit in the valve clinic, she wanted to have time to think about the TAVR before scheduling any tests. She is back today to discuss TAVR. She continues to have dyspnea which has worsened. She is now having LE edema. She has been taking lasix daily for the last two days. Prior to this she was using it as needed. No chest pain. No dizziness.   Primary Care Physician: Marton Redwood, MD Primary cardiology: Dr. Recardo Evangelist  Past Medical History:  Diagnosis Date  . Anxiety   . Aortic stenosis   . Arthritis    oa left knee  . Bilateral lower extremity edema   . Breast cancer ([place])   . Cancer [facility])    left breast CANCER, LUMPECTOMY AND RADIATION  . Carotid artery stenosis    bilateral  . Chest pain on exertion   . CHF (congestive heart failure) ([place])   . Decreased range of motion    RIGHT ARM - HX OF SHOULDER SURGERY AND LIMITED ABILTIY TO RAISE RT ARM ABOVE HEAD  . Depression   . Diabetes mellitus      DIET CONTROL  . Exogenous obesity   . Gout   . Heart murmur    hx mild aortic stenosis  . Hemorrhoids   . History of kidney stones   . Hyperlipidemia   . Hypertension   . Leg cramps    BILATERAL  . Shingles    YRS AGO - NO RESIDUAL PROBLEMS  . Shortness of breath    if she does not take her lasix and with exertion  . Stenosis of right carotid artery 01/27/2016    Past Surgical History:  Procedure Laterality Date  . BREAST LUMPECTOMY  2009   left  . BREAST LUMPECTOMY WITH NEEDLE LOCALIZATION Left 01/06/2015   Procedure: BREAST BIOPSY WITH NEEDLE LOCALIZATION AND SKIN BIOPSY;  Surgeon: Autumn Messing III, MD;  Location: [facility];  Service: General;  Laterality: Left;  . CHOLECYSTECTOMY  2010  . EYE SURGERY     BILATERAL CATARACT EXTRACTIONS AND LENS IMPLANTS  . KNEE SURGERY     right PARTIAL KNEE REPLACEMENT  . SHOULDER SURGERY     right  . SKIN GRAFT TO RIGHT HAND     AFTER BURN TO THE HAND  . TOTAL KNEE ARTHROPLASTY Left 08/03/2013   Procedure: TOTAL LEFT KNEE ARTHROPLASTY;  Surgeon: Gearlean Alf, MD;  [facility];  Service: Orthopedics;  Laterality: Left;  .  US ECHOCARDIOGRAPHY  05/15/2010   EF 60-65%    Current Outpatient Prescriptions  Medication Sig Dispense Refill  . allopurinol (ZYLOPRIM) 300 MG tablet Take 300 mg by mouth daily as needed (gout).    . furosemide (LASIX) 40 MG tablet Take 40 mg by mouth as needed.  2  . lisinopril (PRINIVIL,ZESTRIL) 20 MG tablet Take 1 tablet by mouth daily.  2  . metFORMIN (GLUCOPHAGE) 1000 MG tablet Take 1,000 mg by mouth daily.     . rosuvastatin (CRESTOR) 10 MG tablet Take 1 tablet by mouth daily.  0  . tamoxifen (NOLVADEX) 20 MG tablet Take 1 tablet by mouth every morning. 90 tablet 3  . VITAMIN D, ERGOCALCIFEROL, PO Take 2,000 Units by mouth daily.      No current facility-administered medications for this visit.     No Known Allergies  Social History   Social History  . Marital status: Married     Spouse name: N/A  . Number of children: 4  . Years of education: N/A   Occupational History  . 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 Smoking status: Never Smoker  . Smokeless tobacco: Never Used  . Alcohol use No  . Drug use: No  . Sexual activity: Not Currently   Other Topics Concern  . Not on file   Social History Narrative   Epworth Sleepiness scale score =11 as of 01/25/16    Family History  Problem Relation Age of Onset  . Cancer Mother        pancreatic  . Heart disease Father   . Hypertension Father   . Cancer Brother   . Diabetes Sister     Review of Systems:  As stated in the HPI and otherwise negative.   BP (!) 153/70   Pulse 93   Ht 5\' 4"  (1.626 m)   Wt 251 lb 12.8 oz (114.2 kg)   SpO2 94%   BMI 43.22 kg/m   Physical Examination: General: Well developed, well nourished, NAD  HEENT: OP clear, mucus membranes moist  SKIN: warm, dry. No rashes. Neuro: No focal deficits  Musculoskeletal: Muscle strength 5/5 all ext  Psychiatric: Mood and affect normal  Neck: No JVD, no carotid bruits, no thyromegaly, no lymphadenopathy.  Lungs:Clear bilaterally, no wheezes, rhonci, crackles Cardiovascular: Regular rate and rhythm. Loud harsh systolic murmur. No gallops or rubs. Abdomen:Soft. Bowel sounds present. Non-tender.  Extremities: 1+ bilateral lower extremity edema.   Echo 09/04/16: Left ventricle: The cavity size was normal. Wall thickness was   increased in a pattern of moderate LVH. Systolic function was   normal. The estimated ejection fraction was in the range of 60%   to 65%. Wall motion was normal; there were no regional wall   motion abnormalities. Doppler parameters are consistent with   pseudonormal left ventricular relaxation (grade 2 diastolic   dysfunction). LV filling pressure is elevated. - Aortic valve: Moderately calcified leaflets. Severe stenosis.   Mean gradient (S): 46 mm Hg. Peak gradient (S): 81 mm Hg.  Valve   area (Vmax): 0.72 cm^2. - Mitral valve: Posterior calcified annulus. Mildly thickened   leaflets . - Left atrium: Moderately dilated. - Pulmonic valve: There was mild regurgitation. - Inferior vena cava: The vessel was dilated. The respirophasic   diameter changes were blunted (< 50%), consistent with elevated   central venous pressure.  Impressions:  - Compared to a prior study in 2017, there is now severe aortic  valve stenosis. AVA around 0.7 cm2 - mean gradient of 46 mmHg.   LVEF is stable at 60-65%.  EKG:  EKG is not ordered today. The ekg ordered today demonstrates   Recent Labs: No results found for requested labs within last 8760 hours.   Lipid Panel No results found for: CHOL, TRIG, HDL, CHOLHDL, VLDL, LDLCALC, LDLDIRECT   Wt Readings from Last 3 Encounters:  12/06/16 251 lb 12.8 oz (114.2 kg)  10/10/16 252 lb 3.2 oz (114.4 kg)  10/01/16 257 lb (116.6 kg)     Other studies Reviewed: Additional studies/ records that were reviewed today include: . Review of the above records demonstrates:   STS Risk Score:  Risk of Mortality: 3.783%  Morbidity or Mortality: 18.894%  Long Length of Stay: 8.702%  Short Length of Stay: 25.804%  Permanent Stroke: 1.591%  Prolonged Ventilation: 12.901%  DSW Infection: 0.476%  Renal Failure: 7.079%  Reoperation: 6.324%   Assessment and Plan:   1. Severe aortic valve stenosis: She has stage D symptomatic aortic valve stenosis. We had a previous discussion regarding TAVR. I think she would be a good candidate for TAVR. She would not be an optimal candidate for surgical AVR. I have reviewed her echo images again today. Her valve leaflets are thickened and do not open well. She is now willing to proceed with TAVR. I have reviewed all risks of the procedure again with the patient and her daughter. I will arrange a right and left heart cath at [facility] 12/17/16 at 7:30am. She will need to be seen by one of the CT surgeons on our TAVR  team following the cath. We will arrange cardiac CT and CTA of the chest/abd/pelvis after her cath. Risks of cath reviewed. Pre-cath labs today.   Current medicines are reviewed at length with the patient today.  The patient does not have concerns regarding medicines.  The following changes have been made:  no change  Labs/ tests ordered today include:  No orders of the defined types were placed in this encounter.  Disposition:   FU with me in 8 weeks  Signed, Lauree Chandler, MD 12/06/2016 4:16 PM    [facility] [place], [place], [place]  [address] Phone: [phone]; Fax: [phone]

## 2016-12-06 NOTE — Patient Instructions (Addendum)
Medication Instructions:  Your physician recommends that you continue on your current medications as directed. Please refer to the Current Medication list given to you today.   Labwork: Lab work to be done today--BMP, CBC, PT  Testing/Procedures: Your physician has requested that you have a cardiac catheterization. Cardiac catheterization is used to diagnose and/or treat various heart conditions. Doctors may recommend this procedure for a number of different reasons. The most common reason is to evaluate chest pain. Chest pain can be a symptom of coronary artery disease (CAD), and cardiac catheterization can show whether plaque is narrowing or blocking your heart's arteries. This procedure is also used to evaluate the valves, as well as measure the blood flow and oxygen levels in different parts of your heart. For further information please visit [email or web address]. Please follow instruction sheet, as given.  Scheduled for July 23,2018   Follow-Up: To be determined after catheterization  Any Other Special Instructions Will Be Listed Below (If Applicable).     If you need a refill on your cardiac medications before your next appointment, please call your pharmacy.

## 2016-12-07 ENCOUNTER — Other Ambulatory Visit: Payer: Self-pay | Admitting: *Deleted

## 2016-12-07 DIAGNOSIS — I350 Nonrheumatic aortic (valve) stenosis: Secondary | ICD-10-CM

## 2016-12-07 DIAGNOSIS — I5032 Chronic diastolic (congestive) heart failure: Secondary | ICD-10-CM

## 2016-12-07 LAB — BASIC METABOLIC PANEL
BUN / CREAT RATIO: 23 (ref 12–28)
BUN: 35 mg/dL — AB (ref 8–27)
CHLORIDE: 107 mmol/L — AB (ref 96–106)
CO2: 18 mmol/L — AB (ref 20–29)
Calcium: 10.1 mg/dL (ref 8.7–10.3)
Creatinine, Ser: 1.52 mg/dL — ABNORMAL HIGH (ref 0.57–1.00)
GFR calc Af Amer: 38 mL/min/{1.73_m2} — ABNORMAL LOW (ref >59)
GFR calc non Af Amer: 33 mL/min/{1.73_m2} — ABNORMAL LOW (ref >59)
GLUCOSE: 136 mg/dL — AB (ref 65–99)
Potassium: 4.5 mmol/L (ref 3.5–5.2)
Sodium: 144 mmol/L (ref 134–144)

## 2016-12-07 LAB — CBC WITH DIFFERENTIAL/PLATELET
BASOS ABS: 0 10*3/uL (ref 0.0–0.2)
Basos: 0 %
EOS (ABSOLUTE): 0.3 10*3/uL (ref 0.0–0.4)
Eos: 4 %
Hematocrit: 32.6 % — ABNORMAL LOW (ref 34.0–46.6)
Hemoglobin: 10.7 g/dL — ABNORMAL LOW (ref 11.1–15.9)
IMMATURE GRANULOCYTES: 0 %
Immature Grans (Abs): 0 10*3/uL (ref 0.0–0.1)
LYMPHS ABS: 2 10*3/uL (ref 0.7–3.1)
Lymphs: 33 %
MCH: 31.8 pg (ref 26.6–33.0)
MCHC: 32.8 g/dL (ref 31.5–35.7)
MCV: 97 fL (ref 79–97)
MONOS ABS: 0.4 10*3/uL (ref 0.1–0.9)
Monocytes: 7 %
NEUTROS PCT: 56 %
Neutrophils Absolute: 3.3 10*3/uL (ref 1.4–7.0)
PLATELETS: 130 10*3/uL — AB (ref 150–379)
RBC: 3.37 x10E6/uL — AB (ref 3.77–5.28)
RDW: 13.9 % (ref 12.3–15.4)
WBC: 6.1 10*3/uL (ref 3.4–10.8)

## 2016-12-07 LAB — PROTIME-INR
INR: 1 (ref 0.8–1.2)
PROTHROMBIN TIME: 10.6 s (ref 9.1–12.0)

## 2016-12-07 MED ORDER — FUROSEMIDE 40 MG PO TABS: ORAL_TABLET | ORAL | 3 refills | Status: DC

## 2016-12-11 ENCOUNTER — Telehealth: Payer: Self-pay | Admitting: Cardiovascular Disease

## 2016-12-11 NOTE — Telephone Encounter (Signed)
Returned pts call and she just wanted to go over her heart cath instructions and medications again. I had pt get her instructions and we went over them again, together. Pt thanked me for my call.

## 2016-12-11 NOTE — Telephone Encounter (Signed)
New Message     Does patient need pre-med for heart cath Monday ?

## 2016-12-13 ENCOUNTER — Other Ambulatory Visit: Payer: Medicare Other | Admitting: *Deleted

## 2016-12-13 DIAGNOSIS — I350 Nonrheumatic aortic (valve) stenosis: Secondary | ICD-10-CM

## 2016-12-13 DIAGNOSIS — I5032 Chronic diastolic (congestive) heart failure: Secondary | ICD-10-CM

## 2016-12-14 ENCOUNTER — Telehealth: Payer: Self-pay

## 2016-12-14 LAB — BASIC METABOLIC PANEL
BUN / CREAT RATIO: 25 (ref 12–28)
BUN: 34 mg/dL — ABNORMAL HIGH (ref 8–27)
CALCIUM: 9.6 mg/dL (ref 8.7–10.3)
CO2: 23 mmol/L (ref 20–29)
Chloride: 103 mmol/L (ref 96–106)
Creatinine, Ser: 1.37 mg/dL — ABNORMAL HIGH (ref 0.57–1.00)
GFR, EST AFRICAN AMERICAN: 43 mL/min/{1.73_m2} — AB (ref >59)
GFR, EST NON AFRICAN AMERICAN: 37 mL/min/{1.73_m2} — AB (ref >59)
Glucose: 154 mg/dL — ABNORMAL HIGH (ref 65–99)
POTASSIUM: 4.5 mmol/L (ref 3.5–5.2)
Sodium: 141 mmol/L (ref 134–144)

## 2016-12-14 NOTE — Telephone Encounter (Signed)
Patient contacted pre-catheterization at [facility] scheduled for: 12/17/2016 @ 0730 Verified arrival time and place:  [facility] @ 0530 Confirmed AM meds to be taken pre-cath with sip of water:  Notified to take ASA prior to arrival.  Notified to hold lasix morning of procedure.  Reiterated Metformin instruction, per Pt she is going to just stop taking it now and then will resume 48 hours after procedure.   Confirmed patient has responsible person to drive home post procedure and observe patient for 24 hours:  yes Addl concerns:  None noted

## 2016-12-17 ENCOUNTER — Inpatient Hospital Stay (HOSPITAL_COMMUNITY)
Admission: AD | Admit: 2016-12-17 | Discharge: 2016-12-22 | DRG: 981 | Disposition: A | Discharge status: Home / Self Care | Payer: Medicare Other | Source: Ambulatory Visit | Attending: Cardiovascular Disease | Admitting: Cardiovascular Disease

## 2016-12-17 ENCOUNTER — Inpatient Hospital Stay (HOSPITAL_COMMUNITY)
Admission: AD | Disposition: A | Discharge status: Home / Self Care | Payer: Self-pay | Source: Ambulatory Visit | Attending: Cardiovascular Disease

## 2016-12-17 ENCOUNTER — Encounter (HOSPITAL_COMMUNITY): Payer: Self-pay | Admitting: Cardiovascular Disease

## 2016-12-17 DIAGNOSIS — I5043 Acute on chronic combined systolic (congestive) and diastolic (congestive) heart failure: Secondary | ICD-10-CM | POA: Diagnosis present

## 2016-12-17 DIAGNOSIS — I200 Unstable angina: Secondary | ICD-10-CM | POA: Insufficient documentation

## 2016-12-17 DIAGNOSIS — Z9049 Acquired absence of other specified parts of digestive tract: Secondary | ICD-10-CM | POA: Diagnosis not present

## 2016-12-17 DIAGNOSIS — I25110 Atherosclerotic heart disease of native coronary artery with unstable angina pectoris: Principal | ICD-10-CM | POA: Diagnosis present

## 2016-12-17 DIAGNOSIS — K045 Chronic apical periodontitis: Secondary | ICD-10-CM | POA: Diagnosis present

## 2016-12-17 DIAGNOSIS — Z96653 Presence of artificial knee joint, bilateral: Secondary | ICD-10-CM | POA: Diagnosis present

## 2016-12-17 DIAGNOSIS — S3012XA Contusion of groin, initial encounter: Secondary | ICD-10-CM | POA: Diagnosis not present

## 2016-12-17 DIAGNOSIS — N183 Chronic kidney disease, stage 3 unspecified: Secondary | ICD-10-CM | POA: Diagnosis present

## 2016-12-17 DIAGNOSIS — Z853 Personal history of malignant neoplasm of breast: Secondary | ICD-10-CM | POA: Diagnosis not present

## 2016-12-17 DIAGNOSIS — Z01818 Encounter for other preprocedural examination: Secondary | ICD-10-CM | POA: Diagnosis not present

## 2016-12-17 DIAGNOSIS — Z79810 Long term (current) use of selective estrogen receptor modulators (SERMs): Secondary | ICD-10-CM | POA: Diagnosis not present

## 2016-12-17 DIAGNOSIS — Z809 Family history of malignant neoplasm, unspecified: Secondary | ICD-10-CM | POA: Diagnosis not present

## 2016-12-17 DIAGNOSIS — Z800 Family history of malignant neoplasm of digestive organs: Secondary | ICD-10-CM | POA: Diagnosis not present

## 2016-12-17 DIAGNOSIS — I959 Hypotension, unspecified: Secondary | ICD-10-CM | POA: Diagnosis not present

## 2016-12-17 DIAGNOSIS — I5033 Acute on chronic diastolic (congestive) heart failure: Secondary | ICD-10-CM | POA: Diagnosis not present

## 2016-12-17 DIAGNOSIS — Z6841 Body Mass Index (BMI) 40.0 and over, adult: Secondary | ICD-10-CM

## 2016-12-17 DIAGNOSIS — I10 Essential (primary) hypertension: Secondary | ICD-10-CM | POA: Diagnosis not present

## 2016-12-17 DIAGNOSIS — Z9842 Cataract extraction status, left eye: Secondary | ICD-10-CM

## 2016-12-17 DIAGNOSIS — Z961 Presence of intraocular lens: Secondary | ICD-10-CM | POA: Diagnosis present

## 2016-12-17 DIAGNOSIS — I6521 Occlusion and stenosis of right carotid artery: Secondary | ICD-10-CM | POA: Diagnosis not present

## 2016-12-17 DIAGNOSIS — K083 Retained dental root: Secondary | ICD-10-CM | POA: Diagnosis present

## 2016-12-17 DIAGNOSIS — E1122 Type 2 diabetes mellitus with diabetic chronic kidney disease: Secondary | ICD-10-CM | POA: Diagnosis present

## 2016-12-17 DIAGNOSIS — E669 Obesity, unspecified: Secondary | ICD-10-CM | POA: Diagnosis not present

## 2016-12-17 DIAGNOSIS — Z833 Family history of diabetes mellitus: Secondary | ICD-10-CM

## 2016-12-17 DIAGNOSIS — Z8249 Family history of ischemic heart disease and other diseases of the circulatory system: Secondary | ICD-10-CM | POA: Diagnosis not present

## 2016-12-17 DIAGNOSIS — D62 Acute posthemorrhagic anemia: Secondary | ICD-10-CM | POA: Diagnosis not present

## 2016-12-17 DIAGNOSIS — N2889 Other specified disorders of kidney and ureter: Secondary | ICD-10-CM | POA: Diagnosis present

## 2016-12-17 DIAGNOSIS — S301XXD Contusion of abdominal wall, subsequent encounter: Secondary | ICD-10-CM | POA: Diagnosis not present

## 2016-12-17 DIAGNOSIS — I130 Hypertensive heart and chronic kidney disease with heart failure and stage 1 through stage 4 chronic kidney disease, or unspecified chronic kidney disease: Secondary | ICD-10-CM | POA: Diagnosis present

## 2016-12-17 DIAGNOSIS — S301XXA Contusion of abdominal wall, initial encounter: Secondary | ICD-10-CM | POA: Diagnosis not present

## 2016-12-17 DIAGNOSIS — I2510 Atherosclerotic heart disease of native coronary artery without angina pectoris: Secondary | ICD-10-CM | POA: Diagnosis present

## 2016-12-17 DIAGNOSIS — E119 Type 2 diabetes mellitus without complications: Secondary | ICD-10-CM | POA: Diagnosis present

## 2016-12-17 DIAGNOSIS — Z01810 Encounter for preprocedural cardiovascular examination: Secondary | ICD-10-CM | POA: Diagnosis not present

## 2016-12-17 DIAGNOSIS — I97630 Postprocedural hematoma of a circulatory system organ or structure following a cardiac catheterization: Secondary | ICD-10-CM | POA: Diagnosis not present

## 2016-12-17 DIAGNOSIS — Z7984 Long term (current) use of oral hypoglycemic drugs: Secondary | ICD-10-CM

## 2016-12-17 DIAGNOSIS — R0600 Dyspnea, unspecified: Secondary | ICD-10-CM | POA: Diagnosis present

## 2016-12-17 DIAGNOSIS — I350 Nonrheumatic aortic (valve) stenosis: Secondary | ICD-10-CM | POA: Diagnosis present

## 2016-12-17 DIAGNOSIS — M109 Gout, unspecified: Secondary | ICD-10-CM | POA: Diagnosis present

## 2016-12-17 DIAGNOSIS — Z9841 Cataract extraction status, right eye: Secondary | ICD-10-CM

## 2016-12-17 DIAGNOSIS — R0609 Other forms of dyspnea: Secondary | ICD-10-CM | POA: Diagnosis present

## 2016-12-17 DIAGNOSIS — E1169 Type 2 diabetes mellitus with other specified complication: Secondary | ICD-10-CM | POA: Diagnosis present

## 2016-12-17 DIAGNOSIS — K036 Deposits [accretions] on teeth: Secondary | ICD-10-CM | POA: Diagnosis present

## 2016-12-17 DIAGNOSIS — E785 Hyperlipidemia, unspecified: Secondary | ICD-10-CM | POA: Diagnosis present

## 2016-12-17 DIAGNOSIS — K047 Periapical abscess without sinus: Secondary | ICD-10-CM | POA: Diagnosis present

## 2016-12-17 DIAGNOSIS — M264 Malocclusion, unspecified: Secondary | ICD-10-CM | POA: Diagnosis present

## 2016-12-17 DIAGNOSIS — C50412 Malignant neoplasm of upper-outer quadrant of left female breast: Secondary | ICD-10-CM | POA: Diagnosis present

## 2016-12-17 HISTORY — PX: LEFT HEART CATH AND CORONARY ANGIOGRAPHY: CATH118249

## 2016-12-17 HISTORY — DX: Atherosclerotic heart disease of native coronary artery without angina pectoris: I25.10

## 2016-12-17 LAB — GLUCOSE, CAPILLARY
Glucose-Capillary: 173 mg/dL — ABNORMAL HIGH (ref 65–99)
Glucose-Capillary: 182 mg/dL — ABNORMAL HIGH (ref 65–99)
Glucose-Capillary: 187 mg/dL — ABNORMAL HIGH (ref 65–99)
Glucose-Capillary: 222 mg/dL — ABNORMAL HIGH (ref 65–99)
Glucose-Capillary: 230 mg/dL — ABNORMAL HIGH (ref 65–99)

## 2016-12-17 SURGERY — LEFT HEART CATH AND CORONARY ANGIOGRAPHY
Anesthesia: LOCAL

## 2016-12-17 MED ORDER — FUROSEMIDE 10 MG/ML IJ SOLN
40.0000 mg | Freq: Two times a day (BID) | INTRAMUSCULAR | Status: DC
  Administered 2016-12-17 – 2016-12-19 (×5): 40 mg via INTRAVENOUS
  Filled 2016-12-17 (×6): qty 4

## 2016-12-17 MED ORDER — FUROSEMIDE 10 MG/ML IJ SOLN: 40.0000 mg | Freq: Two times a day (BID) | INTRAMUSCULAR | Status: DC

## 2016-12-17 MED ORDER — SODIUM CHLORIDE 0.9% FLUSH: 3.0000 mL | INTRAVENOUS | Status: DC | PRN

## 2016-12-17 MED ORDER — INSULIN ASPART 100 UNIT/ML ~~LOC~~ SOLN
0.0000 [IU] | Freq: Three times a day (TID) | SUBCUTANEOUS | Status: DC
  Administered 2016-12-17: 5 [IU] via SUBCUTANEOUS
  Administered 2016-12-18: 8 [IU] via SUBCUTANEOUS
  Administered 2016-12-18 – 2016-12-19 (×4): 3 [IU] via SUBCUTANEOUS
  Administered 2016-12-19: 8 [IU] via SUBCUTANEOUS
  Administered 2016-12-20: 5 [IU] via SUBCUTANEOUS
  Administered 2016-12-20 (×2): 3 [IU] via SUBCUTANEOUS
  Administered 2016-12-21: 5 [IU] via SUBCUTANEOUS
  Administered 2016-12-21 – 2016-12-22 (×3): 3 [IU] via SUBCUTANEOUS

## 2016-12-17 MED ORDER — SODIUM CHLORIDE 0.9 % IV SOLN: 250.0000 mL | INTRAVENOUS | Status: DC | PRN

## 2016-12-17 MED ORDER — FENTANYL CITRATE (PF) 100 MCG/2ML IJ SOLN
INTRAMUSCULAR | Status: AC
  Filled 2016-12-17: qty 2

## 2016-12-17 MED ORDER — SODIUM CHLORIDE 0.9% FLUSH
3.0000 mL | Freq: Two times a day (BID) | INTRAVENOUS | Status: DC
  Administered 2016-12-17 – 2016-12-20 (×4): 3 mL via INTRAVENOUS

## 2016-12-17 MED ORDER — SODIUM CHLORIDE 0.9% FLUSH: 3.0000 mL | Freq: Two times a day (BID) | INTRAVENOUS | Status: DC

## 2016-12-17 MED ORDER — HEPARIN (PORCINE) IN NACL 2-0.9 UNIT/ML-% IJ SOLN
INTRAMUSCULAR | Status: AC
  Filled 2016-12-17: qty 1000

## 2016-12-17 MED ORDER — SODIUM CHLORIDE 0.9 % IV SOLN: INTRAVENOUS | Status: AC

## 2016-12-17 MED ORDER — LIDOCAINE HCL (PF) 1 % IJ SOLN
INTRAMUSCULAR | Status: AC
  Filled 2016-12-17: qty 30

## 2016-12-17 MED ORDER — ASPIRIN 81 MG PO CHEW
81.0000 mg | CHEWABLE_TABLET | Freq: Every day | ORAL | Status: DC
  Administered 2016-12-18 – 2016-12-22 (×4): 81 mg via ORAL
  Filled 2016-12-17 (×4): qty 1

## 2016-12-17 MED ORDER — METOPROLOL TARTRATE 25 MG PO TABS
25.0000 mg | ORAL_TABLET | Freq: Two times a day (BID) | ORAL | Status: DC
  Administered 2016-12-17 – 2016-12-22 (×11): 25 mg via ORAL
  Filled 2016-12-17 (×11): qty 1

## 2016-12-17 MED ORDER — TAMOXIFEN CITRATE 10 MG PO TABS
20.0000 mg | ORAL_TABLET | Freq: Every morning | ORAL | Status: DC
  Administered 2016-12-17 – 2016-12-22 (×5): 20 mg via ORAL
  Filled 2016-12-17 (×6): qty 2

## 2016-12-17 MED ORDER — IOPAMIDOL (ISOVUE-370) INJECTION 76%
INTRAVENOUS | Status: AC
  Filled 2016-12-17: qty 100

## 2016-12-17 MED ORDER — MIDAZOLAM HCL 2 MG/2ML IJ SOLN
INTRAMUSCULAR | Status: AC
  Filled 2016-12-17: qty 2

## 2016-12-17 MED ORDER — HYDRALAZINE HCL 25 MG PO TABS
25.0000 mg | ORAL_TABLET | Freq: Three times a day (TID) | ORAL | Status: DC
  Administered 2016-12-17 – 2016-12-20 (×7): 25 mg via ORAL
  Filled 2016-12-17 (×8): qty 1

## 2016-12-17 MED ORDER — MIDAZOLAM HCL 2 MG/2ML IJ SOLN
INTRAMUSCULAR | Status: DC | PRN
  Administered 2016-12-17: 0.5 mg via INTRAVENOUS
  Administered 2016-12-17: 1 mg via INTRAVENOUS
  Administered 2016-12-17: 0.5 mg via INTRAVENOUS

## 2016-12-17 MED ORDER — FENTANYL CITRATE (PF) 100 MCG/2ML IJ SOLN
INTRAMUSCULAR | Status: DC | PRN
  Administered 2016-12-17 (×2): 12.5 ug via INTRAVENOUS
  Administered 2016-12-17: 25 ug via INTRAVENOUS

## 2016-12-17 MED ORDER — HEPARIN (PORCINE) IN NACL 2-0.9 UNIT/ML-% IJ SOLN
INTRAMUSCULAR | Status: AC | PRN
  Administered 2016-12-17 (×2): 1000 mL

## 2016-12-17 MED ORDER — ONDANSETRON HCL 4 MG/2ML IJ SOLN
4.0000 mg | Freq: Four times a day (QID) | INTRAMUSCULAR | Status: DC | PRN
  Administered 2016-12-21: 4 mg via INTRAVENOUS

## 2016-12-17 MED ORDER — ASPIRIN 81 MG PO CHEW: 81.0000 mg | CHEWABLE_TABLET | ORAL | Status: DC

## 2016-12-17 MED ORDER — VERAPAMIL HCL 2.5 MG/ML IV SOLN
INTRAVENOUS | Status: AC
  Filled 2016-12-17: qty 2

## 2016-12-17 MED ORDER — LIDOCAINE HCL (PF) 1 % IJ SOLN
INTRAMUSCULAR | Status: DC | PRN
  Administered 2016-12-17: 25 mL

## 2016-12-17 MED ORDER — ROSUVASTATIN CALCIUM 10 MG PO TABS
10.0000 mg | ORAL_TABLET | Freq: Every day | ORAL | Status: DC
  Administered 2016-12-17 – 2016-12-22 (×5): 10 mg via ORAL
  Filled 2016-12-17 (×5): qty 1

## 2016-12-17 MED ORDER — ACETAMINOPHEN 325 MG PO TABS: 650.0000 mg | ORAL_TABLET | ORAL | Status: DC | PRN

## 2016-12-17 MED ORDER — FENTANYL CITRATE (PF) 100 MCG/2ML IJ SOLN
50.0000 ug | Freq: Once | INTRAMUSCULAR | Status: AC
  Administered 2016-12-17: 50 ug via INTRAVENOUS

## 2016-12-17 MED ORDER — IOPAMIDOL (ISOVUE-370) INJECTION 76%
INTRAVENOUS | Status: DC | PRN
  Administered 2016-12-17: 85 mL via INTRA_ARTERIAL

## 2016-12-17 SURGICAL SUPPLY — 20 items
CATH INFINITI 5FR AL1 (CATHETERS) ×1 IMPLANT
CATH INFINITI 5FR MULTPACK ANG (CATHETERS) ×1 IMPLANT
CATH SWAN GANZ 7F STRAIGHT (CATHETERS) ×1 IMPLANT
COVER PRB 48X5XTLSCP FOLD TPE (BAG) IMPLANT
COVER PROBE 5X48 (BAG) ×2
GLIDESHEATH SLEND SS 6F .021 (SHEATH) ×1 IMPLANT
GUIDEWIRE INQWIRE 1.5J.035X260 (WIRE) IMPLANT
HOVERMATT SINGLE USE (MISCELLANEOUS) ×1 IMPLANT
INQWIRE 1.5J .035X260CM (WIRE) ×2
KIT HEART LEFT (KITS) ×2 IMPLANT
KIT MICROINTRODUCER STIFF 5F (SHEATH) ×1 IMPLANT
PACK CARDIAC CATHETERIZATION (CUSTOM PROCEDURE TRAY) ×2 IMPLANT
SHEATH FAST CATH BRACH 5F 5CM (SHEATH) IMPLANT
SHEATH GLIDE SLENDER 4/5FR (SHEATH) ×1 IMPLANT
SHEATH PINNACLE 5F 10CM (SHEATH) ×1 IMPLANT
SHEATH PINNACLE 7F 10CM (SHEATH) ×1 IMPLANT
TRANSDUCER W/STOPCOCK (MISCELLANEOUS) ×2 IMPLANT
TUBING CIL FLEX 10 FLL-RA (TUBING) ×2 IMPLANT
WIRE EMERALD 3MM-J .035X150CM (WIRE) ×1 IMPLANT
WIRE EMERALD ST .035X150CM (WIRE) ×1 IMPLANT

## 2016-12-17 NOTE — Progress Notes (Signed)
Notified Mc[place]hany of CBG 222 with no coverage orders. I will continue to monitor the client closely.   Saddie Benders RN

## 2016-12-17 NOTE — Progress Notes (Addendum)
Site area: RFA Site Prior to Removal:  Level 0 Pressure Applied For:45 min Manual: yes   Patient Status During Pull:   Post Pull Site:  Level 2 Post Pull Instructions Given:  yes Post Pull Pulses Present: palpable Dressing Applied:  tegaderm Bedrest begins @ 1000 till 1400 Comments: hematoma formed distal to site. Relieved By Sherlyn Lick 15 min into hold. [place] started at 2L/M 10- Dr Shon Hough in to evaluate pt.

## 2016-12-17 NOTE — H&P (View-Only) (Signed)
Valve Clinic Note  Referring: Croituro  History of Present Illness: 81 yo female with history of chronic diastolic CHF, HTN, HLD, DM, gout, breast cancer and severe aortic stenosis who is here today to discuss TAVR. I saw her as a new patient 10/10/16. She was referred by Dr. Recardo Evangelist to discuss her aortic valve stenosis and possible TAVR. She had been followed for years by Dr. Mare Ferrari and most recently by Dr. Recardo Evangelist. She is known to have aortic stenosis for several years. Most recent echo 09/04/16 with normal LV systolic function, severe aortic stenosis with calcified leaflets, restricted leaflet motion, mean gradient 46 mmHg, peak gradient 81 mmHg, AVA 0.72 cm2. She has been having worsened dyspnea with exertion for the past 6 months with mild LE edema. No chest pain. No dizziness, near syncope or syncope. Energy level is down some. She is fairly sedentary but she does her housework, drives and does her own cooking. She is married. 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   At her first visit in the valve clinic, she wanted to have time to think about the TAVR before scheduling any tests. She is back today to discuss TAVR. She continues to have dyspnea which has worsened. She is now having LE edema. She has been taking lasix daily for the last two days. Prior to this she was using it as needed. No chest pain. No dizziness.   Primary Care Physician: Marton Redwood, MD Primary cardiology: Dr. Recardo Evangelist  Past Medical History:  Diagnosis Date  . Anxiety   . Aortic stenosis   . Arthritis    oa left knee  . Bilateral lower extremity edema   . Breast cancer ([place])   . Cancer [facility])    left breast CANCER, LUMPECTOMY AND RADIATION  . Carotid artery stenosis    bilateral  . Chest pain on exertion   . CHF (congestive heart failure) ([place])   . Decreased range of motion    RIGHT ARM - HX OF SHOULDER SURGERY AND LIMITED ABILTIY TO RAISE RT ARM ABOVE HEAD  . Depression   . Diabetes mellitus      DIET CONTROL  . Exogenous obesity   . Gout   . Heart murmur    hx mild aortic stenosis  . Hemorrhoids   . History of kidney stones   . Hyperlipidemia   . Hypertension   . Leg cramps    BILATERAL  . Shingles    YRS AGO - NO RESIDUAL PROBLEMS  . Shortness of breath    if she does not take her lasix and with exertion  . Stenosis of right carotid artery 01/27/2016    Past Surgical History:  Procedure Laterality Date  . BREAST LUMPECTOMY  2009   left  . BREAST LUMPECTOMY WITH NEEDLE LOCALIZATION Left 01/06/2015   Procedure: BREAST BIOPSY WITH NEEDLE LOCALIZATION AND SKIN BIOPSY;  Surgeon: Autumn Messing III, MD;  Location: [facility];  Service: General;  Laterality: Left;  . CHOLECYSTECTOMY  2010  . EYE SURGERY     BILATERAL CATARACT EXTRACTIONS AND LENS IMPLANTS  . KNEE SURGERY     right PARTIAL KNEE REPLACEMENT  . SHOULDER SURGERY     right  . SKIN GRAFT TO RIGHT HAND     AFTER BURN TO THE HAND  . TOTAL KNEE ARTHROPLASTY Left 08/03/2013   Procedure: TOTAL LEFT KNEE ARTHROPLASTY;  Surgeon: Gearlean Alf, MD;  Location: [facility];  Service: Orthopedics;  Laterality: Left;  .  US ECHOCARDIOGRAPHY  05/15/2010   EF 60-65%    Current Outpatient Prescriptions  Medication Sig Dispense Refill  . allopurinol (ZYLOPRIM) 300 MG tablet Take 300 mg by mouth daily as needed (gout).    . furosemide (LASIX) 40 MG tablet Take 40 mg by mouth as needed.  2  . lisinopril (PRINIVIL,ZESTRIL) 20 MG tablet Take 1 tablet by mouth daily.  2  . metFORMIN (GLUCOPHAGE) 1000 MG tablet Take 1,000 mg by mouth daily.     . rosuvastatin (CRESTOR) 10 MG tablet Take 1 tablet by mouth daily.  0  . tamoxifen (NOLVADEX) 20 MG tablet Take 1 tablet by mouth every morning. 90 tablet 3  . VITAMIN D, ERGOCALCIFEROL, PO Take 2,000 Units by mouth daily.      No current facility-administered medications for this visit.     No Known Allergies  Social History   Social History  . Marital status: Married     Spouse name: N/A  . Number of children: 4  . Years of education: N/A   Occupational History  . 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 Smoking status: Never Smoker  . Smokeless tobacco: Never Used  . Alcohol use No  . Drug use: No  . Sexual activity: Not Currently   Other Topics Concern  . Not on file   Social History Narrative   Epworth Sleepiness scale score =11 as of 01/25/16    Family History  Problem Relation Age of Onset  . Cancer Mother        pancreatic  . Heart disease Father   . Hypertension Father   . Cancer Brother   . Diabetes Sister     Review of Systems:  As stated in the HPI and otherwise negative.   BP (!) 153/70   Pulse 93   Ht 5\' 4"  (1.626 m)   Wt 251 lb 12.8 oz (114.2 kg)   SpO2 94%   BMI 43.22 kg/m   Physical Examination: General: Well developed, well nourished, NAD  HEENT: OP clear, mucus membranes moist  SKIN: warm, dry. No rashes. Neuro: No focal deficits  Musculoskeletal: Muscle strength 5/5 all ext  Psychiatric: Mood and affect normal  Neck: No JVD, no carotid bruits, no thyromegaly, no lymphadenopathy.  Lungs:Clear bilaterally, no wheezes, rhonci, crackles Cardiovascular: Regular rate and rhythm. Loud harsh systolic murmur. No gallops or rubs. Abdomen:Soft. Bowel sounds present. Non-tender.  Extremities: 1+ bilateral lower extremity edema.   Echo 09/04/16: Left ventricle: The cavity size was normal. Wall thickness was   increased in a pattern of moderate LVH. Systolic function was   normal. The estimated ejection fraction was in the range of 60%   to 65%. Wall motion was normal; there were no regional wall   motion abnormalities. Doppler parameters are consistent with   pseudonormal left ventricular relaxation (grade 2 diastolic   dysfunction). LV filling pressure is elevated. - Aortic valve: Moderately calcified leaflets. Severe stenosis.   Mean gradient (S): 46 mm Hg. Peak gradient (S): 81 mm Hg.  Valve   area (Vmax): 0.72 cm^2. - Mitral valve: Posterior calcified annulus. Mildly thickened   leaflets . - Left atrium: Moderately dilated. - Pulmonic valve: There was mild regurgitation. - Inferior vena cava: The vessel was dilated. The respirophasic   diameter changes were blunted (< 50%), consistent with elevated   central venous pressure.  Impressions:  - Compared to a prior study in 2017, there is now severe aortic  valve stenosis. AVA around 0.7 cm2 - mean gradient of 46 mmHg.   LVEF is stable at 60-65%.  EKG:  EKG is not ordered today. The ekg ordered today demonstrates   Recent Labs: No results found for requested labs within last 8760 hours.   Lipid Panel No results found for: CHOL, TRIG, HDL, CHOLHDL, VLDL, LDLCALC, LDLDIRECT   Wt Readings from Last 3 Encounters:  12/06/16 251 lb 12.8 oz (114.2 kg)  10/10/16 252 lb 3.2 oz (114.4 kg)  10/01/16 257 lb (116.6 kg)     Other studies Reviewed: Additional studies/ records that were reviewed today include: . Review of the above records demonstrates:   STS Risk Score:  Risk of Mortality: 3.783%  Morbidity or Mortality: 18.894%  Long Length of Stay: 8.702%  Short Length of Stay: 25.804%  Permanent Stroke: 1.591%  Prolonged Ventilation: 12.901%  DSW Infection: 0.476%  Renal Failure: 7.079%  Reoperation: 6.324%   Assessment and Plan:   1. Severe aortic valve stenosis: She has stage D symptomatic aortic valve stenosis. We had a previous discussion regarding TAVR. I think she would be a good candidate for TAVR. She would not be an optimal candidate for surgical AVR. I have reviewed her echo images again today. Her valve leaflets are thickened and do not open well. She is now willing to proceed with TAVR. I have reviewed all risks of the procedure again with the patient and her daughter. I will arrange a right and left heart cath at [facility] 12/17/16 at 7:30am. She will need to be seen by one of the CT surgeons on our TAVR  team following the cath. We will arrange cardiac CT and CTA of the chest/abd/pelvis after her cath. Risks of cath reviewed. Pre-cath labs today.   Current medicines are reviewed at length with the patient today.  The patient does not have concerns regarding medicines.  The following changes have been made:  no change  Labs/ tests ordered today include:  No orders of the defined types were placed in this encounter.  Disposition:   FU with me in 8 weeks  Signed, Lauree Chandler, MD 12/06/2016 4:16 PM    [facility] [address], [place], [place]  [address] Phone: [phone]; Fax: [phone]

## 2016-12-17 NOTE — H&P (Signed)
Patient ID: Carrie Monroe MRN: [id number] DOB/AGE: 06-05-1935 81 y.o. Admit date: 12/17/2016  Primary Care Physician: Marton Redwood, MD Primary Cardiologist: Croituro  HPI:  81 yo female with history of chronic diastolic CHF, HTN, HLD, DM, gout, breast cancer and severe aortic stenosis who is here today for cardiac cath and will be admitted post cath for diuresis. She was referred by Dr. Recardo Evangelist to me to discuss her aortic valve stenosis and possible TAVR. She had been followed for years by Dr. Mare Ferrari and most recently by Dr. Recardo Evangelist. She is known to have aortic stenosis for several years. Most recent echo 09/04/16 with normal LV systolic function, severe aortic stenosis with calcified leaflets, restricted leaflet motion, mean gradient 46 mmHg, peak gradient 81 mmHg, AVA 0.72 cm2. She has been having worsened dyspnea with exertion for the past 6 months with mild LE edema. No chest pain. No dizziness, near syncope or syncope. Energy level is down some. She is fairly sedentary but she does her housework, drives and does her own cooking.  She is here today for cardiac cath and is being admitted post cath for diuresis. Her cardiac cath showed severe triple vessel CAD. She has severe AS. She is volume overloaded. Could not complete right heart cath due to inability to get venous access. She continue to have dyspnea with minimal exertion.   Review of systems complete and found to be negative unless listed above   Past Medical History:  Diagnosis Date  . Anxiety   . Aortic stenosis   . Arthritis    oa left knee  . Bilateral lower extremity edema   . Breast cancer ([place])   . Cancer [facility])    left breast CANCER, LUMPECTOMY AND RADIATION  . Carotid artery stenosis    bilateral  . Chest pain on exertion   . CHF (congestive heart failure) ([place])   . Decreased range of motion    RIGHT ARM - HX OF SHOULDER SURGERY AND LIMITED ABILTIY TO RAISE RT ARM ABOVE HEAD  . Depression   . Diabetes  mellitus    DIET CONTROL  . Exogenous obesity   . Gout   . Heart murmur    hx mild aortic stenosis  . Hemorrhoids   . History of kidney stones   . Hyperlipidemia   . Hypertension   . Leg cramps    BILATERAL  . Shingles    YRS AGO - NO RESIDUAL PROBLEMS  . Shortness of breath    if she does not take her lasix and with exertion  . Stenosis of right carotid artery 01/27/2016    Family History  Problem Relation Age of Onset  . Cancer Mother        pancreatic  . Heart disease Father   . Hypertension Father   . Cancer Brother   . Diabetes Sister     Social History   Social History  . Marital status: Married    Spouse name: N/A  . Number of children: 4  . Years of education: N/A   Occupational History  . 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 Smoking status: Never Smoker  . Smokeless tobacco: Never Used  . Alcohol use No  . Drug use: No  . Sexual activity: Not Currently   Other Topics Concern  . Not on file   Social History Narrative   Epworth Sleepiness scale score =11 as of 01/25/16    Past Surgical History:  Procedure Laterality Date  . BREAST LUMPECTOMY  2009   left  . BREAST LUMPECTOMY WITH NEEDLE LOCALIZATION Left 01/06/2015   Procedure: BREAST BIOPSY WITH NEEDLE LOCALIZATION AND SKIN BIOPSY;  Surgeon: Autumn Messing III, MD;  Location: [facility];  Service: General;  Laterality: Left;  . CHOLECYSTECTOMY  2010  . EYE SURGERY     BILATERAL CATARACT EXTRACTIONS AND LENS IMPLANTS  . KNEE SURGERY     right PARTIAL KNEE REPLACEMENT  . SHOULDER SURGERY     right  . SKIN GRAFT TO RIGHT HAND     AFTER BURN TO THE HAND  . TOTAL KNEE ARTHROPLASTY Left 08/03/2013   Procedure: TOTAL LEFT KNEE ARTHROPLASTY;  Surgeon: Gearlean Alf, MD;  [facility];  Service: Orthopedics;  Laterality: Left;  . US ECHOCARDIOGRAPHY  05/15/2010   EF 60-65%    No Known Allergies  Prior to Admission Meds:  Prior to Admission medications     Medication Sig Start Date End Date Taking? Authorizing Provider  allopurinol (ZYLOPRIM) 300 MG tablet Take 300 mg by mouth daily as needed (gout).   Yes [provider]  Cholecalciferol (VITAMIN D) 2000 units CAPS Take 2,000 Units by mouth daily.   Yes [provider]  furosemide (LASIX) 40 MG tablet Take half tablet by mouth daily as needed for swelling 12/07/16  Yes Burnell Blanks, MD  lisinopril (PRINIVIL,ZESTRIL) 20 MG tablet Take 20 mg by mouth daily.  12/20/15  Yes [provider]  metFORMIN (GLUCOPHAGE) 1000 MG tablet Take 1,000 mg by mouth daily.    Yes [provider]  rosuvastatin (CRESTOR) 10 MG tablet Take 10 mg by mouth daily.  01/17/16  Yes [provider]  tamoxifen (NOLVADEX) 20 MG tablet Take 1 tablet by mouth every morning. 03/26/16  Yes Nicholas Lose, MD  naproxen sodium (ANAPROX) 220 MG tablet Take 220 mg by mouth daily as needed (pain).    [provider]    Physical Exam: Blood pressure (!) 158/45, pulse 77, temperature 97.9 F (36.6 C), temperature source Oral, resp. rate 16, height 5\' 3"  (1.6 m), weight 251 lb (113.9 kg), SpO2 95 %.    General: Well developed, well nourished, NAD  HEENT: OP clear, mucus membranes moist  SKIN: warm, dry. No rashes.  Neuro: No focal deficits  Musculoskeletal: Muscle strength 5/5 all ext  Psychiatric: Mood and affect normal  Neck: No JVD, no carotid bruits, no thyromegaly, no lymphadenopathy.  Lungs:Clear bilaterally, no wheezes, rhonci, crackles  Cardiovascular: Regular rate and rhythm with loud harsh systolic murmur  Abdomen:Soft. Bowel sounds present. Non-tender.  Extremities: 2-3+bilateral lower extremity edema. Pulses are 2 + in the bilateral DP/PT.   Labs:   Lab Results  Component Value Date   WBC 6.1 12/06/2016   HGB 10.7 (L) 12/06/2016   HCT 32.6 (L) 12/06/2016   MCV 97 12/06/2016   PLT 130 (L) 12/06/2016    Recent Labs Lab 12/13/16 1600  NA 141  K 4.5   CL 103  CO2 23  BUN 34*  CREATININE 1.37*  CALCIUM 9.6  GLUCOSE 154*    ASSESSMENT AND PLAN:   1. 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 Severe aortic stenosis 3. Acute on chronic diastolic CHF  Will admit to telemetry and start diuresis with IV Lasix. Will need to follow renal function closely. I will ask CT surgery to see her to evaluate for CABG and surgical AVR. She may be a better candidate for multi-vessel  PCI/stenting and TAVR. Will watch right groin closely as she developed a hematoma post cath. If she is not felt to be a surgical candidate, will need PCI and stenting of the LAD/Circumflex and RCA later this week pending renal function.  Will hold Ace-inh. Will start Hydralazine and beta blocker.   Darlina Guys, MD 12/17/2016, 10:39 AM

## 2016-12-17 NOTE — Interval H&P Note (Signed)
History and Physical Interval Note:  12/17/2016 7:17 AM  Carrie Monroe  has presented today for cardiac cath with the diagnosis of arotic stenosis  The various methods of treatment have been discussed with the patient and family. After consideration of risks, benefits and other options for treatment, the patient has consented to  Procedure(s): Right/Left Heart Cath and Coronary Angiography (N/A) as a surgical intervention .  [name] history has been reviewed, patient examined, no change in status, stable for surgery.  I have reviewed the patient's chart and labs.  Questions were answered to the patient's satisfaction.     Cath Lab Visit (complete for each Cath Lab visit)  Clinical Evaluation Leading to the Procedure:   ACS: No.  Non-ACS:    Anginal Classification: CCS II  Anti-ischemic medical therapy: No Therapy  Non-Invasive Test Results: No non-invasive testing performed  Prior CABG: No previous CABG        Lauree Chandler

## 2016-12-18 ENCOUNTER — Other Ambulatory Visit: Payer: Self-pay | Admitting: *Deleted

## 2016-12-18 ENCOUNTER — Encounter (HOSPITAL_COMMUNITY): Payer: Self-pay | Admitting: *Deleted

## 2016-12-18 ENCOUNTER — Inpatient Hospital Stay (HOSPITAL_COMMUNITY): Payer: Medicare Other

## 2016-12-18 DIAGNOSIS — I200 Unstable angina: Secondary | ICD-10-CM

## 2016-12-18 DIAGNOSIS — D62 Acute posthemorrhagic anemia: Secondary | ICD-10-CM

## 2016-12-18 DIAGNOSIS — I350 Nonrheumatic aortic (valve) stenosis: Secondary | ICD-10-CM

## 2016-12-18 DIAGNOSIS — S301XXA Contusion of abdominal wall, initial encounter: Secondary | ICD-10-CM

## 2016-12-18 DIAGNOSIS — N183 Chronic kidney disease, stage 3 (moderate): Secondary | ICD-10-CM

## 2016-12-18 DIAGNOSIS — I2510 Atherosclerotic heart disease of native coronary artery without angina pectoris: Secondary | ICD-10-CM

## 2016-12-18 LAB — CBC
HCT: 24.5 % — ABNORMAL LOW (ref 36.0–46.0)
HEMOGLOBIN: 7.9 g/dL — AB (ref 12.0–15.0)
MCH: 30.9 pg (ref 26.0–34.0)
MCHC: 32.2 g/dL (ref 30.0–36.0)
MCV: 95.7 fL (ref 78.0–100.0)
Platelets: 121 10*3/uL — ABNORMAL LOW (ref 150–400)
RBC: 2.56 MIL/uL — ABNORMAL LOW (ref 3.87–5.11)
RDW: 14.7 % (ref 11.5–15.5)
WBC: 7.2 10*3/uL (ref 4.0–10.5)

## 2016-12-18 LAB — BASIC METABOLIC PANEL
Anion gap: 6 (ref 5–15)
BUN: 36 mg/dL — ABNORMAL HIGH (ref 6–20)
CALCIUM: 9.1 mg/dL (ref 8.9–10.3)
CHLORIDE: 109 mmol/L (ref 101–111)
CO2: 23 mmol/L (ref 22–32)
CREATININE: 1.76 mg/dL — AB (ref 0.44–1.00)
GFR calc Af Amer: 31 mL/min — ABNORMAL LOW (ref >60)
GFR calc non Af Amer: 27 mL/min — ABNORMAL LOW (ref >60)
Glucose, Bld: 176 mg/dL — ABNORMAL HIGH (ref 65–99)
Potassium: 4.6 mmol/L (ref 3.5–5.1)
Sodium: 138 mmol/L (ref 135–145)

## 2016-12-18 LAB — HEPATIC FUNCTION PANEL
ALT: 12 U/L — ABNORMAL LOW (ref 14–54)
AST: 19 U/L (ref 15–41)
Albumin: 2.9 g/dL — ABNORMAL LOW (ref 3.5–5.0)
Alkaline Phosphatase: 48 U/L (ref 38–126)
Bilirubin, Direct: 0.1 mg/dL (ref 0.1–0.5)
Indirect Bilirubin: 0.7 mg/dL (ref 0.3–0.9)
Total Bilirubin: 0.8 mg/dL (ref 0.3–1.2)
Total Protein: 5.3 g/dL — ABNORMAL LOW (ref 6.5–8.1)

## 2016-12-18 LAB — GLUCOSE, CAPILLARY
GLUCOSE-CAPILLARY: 258 mg/dL — AB (ref 65–99)
Glucose-Capillary: 177 mg/dL — ABNORMAL HIGH (ref 65–99)
Glucose-Capillary: 158 mg/dL — ABNORMAL HIGH (ref 65–99)
Glucose-Capillary: 235 mg/dL — ABNORMAL HIGH (ref 65–99)

## 2016-12-18 LAB — ABO/RH: ABO/RH(D): A POS

## 2016-12-18 LAB — HEMOGLOBIN AND HEMATOCRIT, BLOOD
HEMATOCRIT: 25.3 % — AB (ref 36.0–46.0)
Hemoglobin: 8.2 g/dL — ABNORMAL LOW (ref 12.0–15.0)

## 2016-12-18 LAB — PREPARE RBC (CROSSMATCH)

## 2016-12-18 MED ORDER — NAPROXEN 250 MG PO TABS
250.0000 mg | ORAL_TABLET | Freq: Once | ORAL | Status: AC
  Administered 2016-12-18: 250 mg via ORAL
  Filled 2016-12-18: qty 1

## 2016-12-18 MED ORDER — SODIUM CHLORIDE 0.9 % IV SOLN
Freq: Once | INTRAVENOUS | Status: AC
  Administered 2016-12-18: 06:00:00 via INTRAVENOUS

## 2016-12-18 MED FILL — Verapamil HCl IV Soln 2.5 MG/ML: INTRAVENOUS | Qty: 2 | Status: AC

## 2016-12-18 NOTE — Progress Notes (Signed)
Text paged PA [facility] re: significant drop in H/H, the only symptom is low B/P. Patient is feeling tired but hasn't slept well in last 2 nights and had some Naprosyn earlier for generalized achiness, awaiting call back, will continue to monitor.

## 2016-12-18 NOTE — Consult Note (Signed)
[address]       [place] [address]             [phone]      Cardiothoracic Surgery Consultation  Reason for Consult: Severe multi-vessel coronary artery disease and severe aortic stenosis. Referring Physician: Dr. Lauree Chandler  Carrie Monroe is an 81 y.o. female.  HPI:   The patient is a 81 year old woman with diabetes, Stage III chronic kidney disease, hyperlipidemia, hypertension, morbid obesity, gout and breast cancer, s/p lumpectomy and XRT who has a several year hx of aortic stenosis followed by Dr. Sallyanne Kuster. She reports a six month history of progressive exertional fatigue and shortness of breath. Her echo on 09/04/2016 showed severe AS with a mean gradient of 46 mm Hg and a peak of 81. LVEF was 60-65% with grade 2 diastolic dysfunction. She reports developing marked lower extremity edema over the past 3 weeks and was started on lasix with improvement.  She underwent cath yesterday showing severe 3-vessel CAD with 90% proximal to mid LAD at the origin of a moderate sized diagonal that has 70% ostial stenosis. The LCX has 99% mid vessel stenosis. The RCA has 80% mid vessel stenosis. She was volume overloaded at the time of cath and admitted for diuresis.  Past Medical History:  Diagnosis Date  . Anxiety   . Aortic stenosis   . Arthritis    oa left knee  . Bilateral lower extremity edema   . Breast cancer ([place])   . Cancer [facility])    left breast CANCER, LUMPECTOMY AND RADIATION  . Carotid artery stenosis    bilateral  . Chest pain on exertion   . CHF (congestive heart failure) ([place])   . Coronary artery disease   . Decreased range of motion    RIGHT ARM - HX OF SHOULDER SURGERY AND LIMITED ABILTIY TO RAISE RT ARM ABOVE HEAD  . Depression   . Diabetes mellitus    DIET CONTROL  . Exogenous obesity   . Gout   . Heart murmur    hx mild aortic stenosis  . Hemorrhoids   . History of kidney stones   . Hyperlipidemia   . Hypertension     . Leg cramps    BILATERAL  . Shingles    YRS AGO - NO RESIDUAL PROBLEMS  . Shortness of breath    if she does not take her lasix and with exertion  . Stenosis of right carotid artery 01/27/2016  . Unstable angina [facility])     Past Surgical History:  Procedure Laterality Date  . BREAST LUMPECTOMY  2009   left  . BREAST LUMPECTOMY WITH NEEDLE LOCALIZATION Left 01/06/2015   Procedure: BREAST BIOPSY WITH NEEDLE LOCALIZATION AND SKIN BIOPSY;  Surgeon: Autumn Messing III, MD;  Location: [facility];  Service: General;  Laterality: Left;  . CATARACT EXTRACTION    . CHOLECYSTECTOMY  2010  . EYE SURGERY     BILATERAL CATARACT EXTRACTIONS AND LENS IMPLANTS  . KNEE SURGERY     right PARTIAL KNEE REPLACEMENT  . LEFT HEART CATH AND CORONARY ANGIOGRAPHY N/A 12/17/2016   Procedure: Left Heart Cath and Coronary Angiography;  Surgeon: Burnell Blanks, MD;  Location: [facility];  Service: Cardiovascular;  Laterality: N/A;  . SHOULDER SURGERY     right  . SKIN GRAFT TO RIGHT HAND     AFTER BURN TO THE HAND  . TOTAL KNEE ARTHROPLASTY Left 08/03/2013  Procedure: TOTAL LEFT KNEE ARTHROPLASTY;  Surgeon: Gearlean Alf, MD;  Location: [facility];  Service: Orthopedics;  Laterality: Left;  . US ECHOCARDIOGRAPHY  05/15/2010   EF 60-65%    Family History  Problem Relation Age of Onset  . Cancer Mother        pancreatic  . Heart disease Father   . Hypertension Father   . Cancer Brother   . Diabetes Sister     Social History:  reports that she has never smoked. She has never used smokeless tobacco. She reports that she does not drink alcohol or use drugs.  Allergies: No Known Allergies  Medications:  I have reviewed the patient's current medications. Prior to Admission:  Prescriptions Prior to Admission  Medication Sig Dispense Refill Last Dose  . allopurinol (ZYLOPRIM) 300 MG tablet Take 300 mg by mouth daily as needed (gout).   12/16/2016 at 1800  . Cholecalciferol (VITAMIN  D) 2000 units CAPS Take 2,000 Units by mouth daily.   12/16/2016 at 1800  . furosemide (LASIX) 40 MG tablet Take half tablet by mouth daily as needed for swelling 30 tablet 3 Past Week at Unknown time  . lisinopril (PRINIVIL,ZESTRIL) 20 MG tablet Take 20 mg by mouth daily.   2 12/16/2016 at 1800  . metFORMIN (GLUCOPHAGE) 1000 MG tablet Take 1,000 mg by mouth daily.    12/14/2016  . rosuvastatin (CRESTOR) 10 MG tablet Take 10 mg by mouth daily.   0 12/16/2016 at 1800  . tamoxifen (NOLVADEX) 20 MG tablet Take 1 tablet by mouth every morning. 90 tablet 3 12/16/2016 at 1800  . naproxen sodium (ANAPROX) 220 MG tablet Take 220 mg by mouth daily as needed (pain).   More than a month at Unknown time   Scheduled: . aspirin  81 mg Oral Daily  . furosemide  40 mg Intravenous BID  . hydrALAZINE  25 mg Oral Q8H  . insulin aspart  0-15 Units Subcutaneous TID WC  . metoprolol tartrate  25 mg Oral BID  . rosuvastatin  10 mg Oral Daily  . sodium chloride flush  3 mL Intravenous Q12H  . tamoxifen  20 mg Oral q morning - 10a   Continuous: . sodium chloride     ZOX:WRUEAV chloride, acetaminophen, ondansetron (ZOFRAN) IV, sodium chloride flush Anti-infectives    None      Results for orders placed or performed during the hospital encounter of 12/17/16 (from the past 48 hour(s))  Glucose, capillary     Status: Abnormal   Collection Time: 12/17/16  5:47 AM  Result Value Ref Range   Glucose-Capillary 173 (H) 65 - 99 mg/dL  Glucose, capillary     Status: Abnormal   Collection Time: 12/17/16  9:05 AM  Result Value Ref Range   Glucose-Capillary 187 (H) 65 - 99 mg/dL  Glucose, capillary     Status: Abnormal   Collection Time: 12/17/16 12:00 PM  Result Value Ref Range   Glucose-Capillary 222 (H) 65 - 99 mg/dL  Glucose, capillary     Status: Abnormal   Collection Time: 12/17/16  4:32 PM  Result Value Ref Range   Glucose-Capillary 230 (H) 65 - 99 mg/dL  Glucose, capillary     Status: Abnormal   Collection  Time: 12/17/16 11:52 PM  Result Value Ref Range   Glucose-Capillary 182 (H) 65 - 99 mg/dL  CBC     Status: Abnormal   Collection Time: 12/18/16  2:30 AM  Result Value Ref Range   WBC 7.2  4.0 - 10.5 K/uL   RBC 2.56 (L) 3.87 - 5.11 MIL/uL   Hemoglobin 7.9 (L) 12.0 - 15.0 g/dL   HCT 24.5 (L) 36.0 - 46.0 %   MCV 95.7 78.0 - 100.0 fL   MCH 30.9 26.0 - 34.0 pg   MCHC 32.2 30.0 - 36.0 g/dL   RDW 14.7 11.5 - 15.5 %   Platelets 121 (L) 150 - 400 K/uL  Basic metabolic panel     Status: Abnormal   Collection Time: 12/18/16  2:30 AM  Result Value Ref Range   Sodium 138 135 - 145 mmol/L   Potassium 4.6 3.5 - 5.1 mmol/L   Chloride 109 101 - 111 mmol/L   CO2 23 22 - 32 mmol/L   Glucose, Bld 176 (H) 65 - 99 mg/dL   BUN 36 (H) 6 - 20 mg/dL   Creatinine, Ser 1.76 (H) 0.44 - 1.00 mg/dL   Calcium 9.1 8.9 - 10.3 mg/dL   GFR calc non Af Amer 27 (L) >60 mL/min   GFR calc Af Amer 31 (L) >60 mL/min    Comment: (NOTE) The eGFR has been calculated using the CKD EPI equation. This calculation has not been validated in all clinical situations. eGFR's persistently <60 mL/min signify possible Chronic Kidney Disease.    Anion gap 6 5 - 15  Hepatic function panel     Status: Abnormal   Collection Time: 12/18/16  2:30 AM  Result Value Ref Range   Total Protein 5.3 (L) 6.5 - 8.1 g/dL   Albumin 2.9 (L) 3.5 - 5.0 g/dL   AST 19 15 - 41 U/L   ALT 12 (L) 14 - 54 U/L   Alkaline Phosphatase 48 38 - 126 U/L   Total Bilirubin 0.8 0.3 - 1.2 mg/dL   Bilirubin, Direct 0.1 0.1 - 0.5 mg/dL   Indirect Bilirubin 0.7 0.3 - 0.9 mg/dL  Hemoglobin and Hematocrit     Status: Abnormal   Collection Time: 12/18/16  7:18 AM  Result Value Ref Range   Hemoglobin 8.2 (L) 12.0 - 15.0 g/dL   HCT 25.3 (L) 36.0 - 46.0 %  Type and screen [place]     Status: None (Preliminary result)   Collection Time: 12/18/16  7:21 AM  Result Value Ref Range   ABO/RH(D) A POS    Antibody Screen NEG    Sample Expiration  12/21/2016    Unit Number [id number]    Blood Component Type RBC LR PHER2    Unit division 00    Status of Unit ALLOCATED    Transfusion Status OK TO TRANSFUSE    Crossmatch Result Compatible    Unit Number [id number]    Blood Component Type RED CELLS,LR    Unit division 00    Status of Unit ISSUED    Transfusion Status OK TO TRANSFUSE    Crossmatch Result Compatible   Prepare RBC     Status: None   Collection Time: 12/18/16  7:21 AM  Result Value Ref Range   Order Confirmation ORDER PROCESSED BY BLOOD BANK   ABO/Rh     Status: None   Collection Time: 12/18/16  7:21 AM  Result Value Ref Range   ABO/RH(D) A POS   Glucose, capillary     Status: Abnormal   Collection Time: 12/18/16  7:33 AM  Result Value Ref Range   Glucose-Capillary 177 (H) 65 - 99 mg/dL  Glucose, capillary     Status: Abnormal   Collection Time: 12/18/16 11:22  AM  Result Value Ref Range   Glucose-Capillary 258 (H) 65 - 99 mg/dL    Ct Abdomen Pelvis Wo Contrast  Result Date: 12/18/2016 CLINICAL DATA:  81 year old female post cardiac catheterization with right groin hematoma. Low hemoglobin. Initial encounter. EXAM: CT ABDOMEN AND PELVIS WITHOUT CONTRAST TECHNIQUE: Multidetector CT imaging of the abdomen and pelvis was performed following the standard protocol without IV contrast. COMPARISON:  None. FINDINGS: Lower chest: Right middle lobe 3.1 mm nodule (series 4, image 9 and series 7, image 52). Mild cardiomegaly. Aortic and mitral valve calcifications. Trace coronary artery calcifications. Skin thickening left breast incompletely assessed. Correlation with clinical exam and mammography may be considered. Per medical chart patient has a history of breast cancer. Hepatobiliary: Slightly lobulated liver without other findings of cirrhosis. Two small calcifications. Taking into account limitation by non contrast imaging, no worrisome hepatic lesion. Postcholecystectomy. Pancreas: Taking into account limitation by  non contrast imaging, no mass or inflammation. Spleen: Taking into account limitation by non contrast imaging, no primary splenic lesion. The splenic artery is tortuous and calcified. Without contrast, cannot evaluate for the presence of an aneurysm. Prominent splenic renal collaterals along the inferior aspect of the spleen. Adrenals/Urinary Tract: No hydronephrosis. Bilateral renal cysts. Taking into account limitation by non contrast imaging, no worrisome renal or adrenal lesion. Contrast filled urinary bladder without gross abnormality. Stomach/Bowel: No primary bowel inflammatory process. Portions of stomach and colon under distended. Moderate stool throughout the colon. Mild rectal prolapse. Vascular/Lymphatic: Hematoma right femoral artery/ vein region predominantly superficial to upper thigh musculature and involving subcutaneous region. No extension along the psoas muscle. No retroperitoneal hematoma. Atherosclerotic changes aorta without aneurysm. Atherosclerotic changes iliac artery is and femoral arteries. Scattered normal size lymph nodes. Reproductive: No obvious abnormality. Other: No free peritoneal air. Diastases rectus muscles with postsurgical changes/ scar. No bowel containing hernia. Right inguinal fat containing hernia. Musculoskeletal: Scoliosis lumbar spine with superimposed degenerative changes. Schmorl's node deformity of the sacrum. No focal compression fracture. IMPRESSION: Hematoma right femoral artery/ vein region predominantly superficial to upper thigh musculature and involving subcutaneous region. No extension along the psoas muscle. No retroperitoneal hematoma. Splenic artery is tortuous and calcified. Without contrast, cannot evaluate for the presence of an aneurysm. Prominent splenic renal collaterals along the inferior aspect of the spleen. Skin thickening left breast incompletely assessed. Correlation with clinical exam and mammography (if not recently performed) may be  considered. Per medical chart patient has a history of breast cancer. Right middle lobe 3.1 mm nodule (series 4, image 9 and series 7, image 52). No follow-up needed if patient is low-risk. Non-contrast chest CT can be considered in 12 months if patient is high-risk. This recommendation follows the consensus statement: Guidelines for Management of Incidental Pulmonary Nodules Detected on CT Images: From the Fleischner Society 2017; Radiology 2017; 284:228-243. Aortic Atherosclerosis (ICD10-I70.0). Electronically Signed   By: Genia Del M.D.   On: 12/18/2016 07:58    Review of Systems  Constitutional: Positive for malaise/fatigue. Negative for chills and fever.       Weight gain  HENT:       Told by dentist recently that she had an abscessed tooth that required treatment. She denies any pain.  Eyes: Negative.   Respiratory: Positive for shortness of breath.   Cardiovascular: Positive for orthopnea and leg swelling. Negative for chest pain, palpitations and PND.  Gastrointestinal: Negative.   Genitourinary: Negative.   Musculoskeletal: Positive for joint pain.  Skin: Negative.   Neurological: Negative for dizziness,  focal weakness and loss of consciousness.  Endo/Heme/Allergies: Negative.   Psychiatric/Behavioral: Positive for depression.   Blood pressure (!) 115/39, pulse (!) 59, temperature 98 F (36.7 C), temperature source Oral, resp. rate 14, height 5' 3"  (1.6 m), weight 116.9 kg (257 lb 11.2 oz), SpO2 99 %. Physical Exam  Constitutional: She is oriented to person, place, and time.  Morbidly obese woman in no distress  HENT:  Head: Normocephalic and atraumatic.  Teeth in poor condition. Partial upper plate  Eyes: Pupils are equal, round, and reactive to light. EOM are normal.  Neck: Normal range of motion. Neck supple. No JVD present. No thyromegaly present.  Bilateral cervical bruit or transmitted murmur  Cardiovascular: Normal rate and regular rhythm.   Murmur heard. 3/6  systolic murmur RSB  Respiratory: Effort normal and breath sounds normal. No respiratory distress.  GI: Soft. Bowel sounds are normal. She exhibits no distension and no mass. There is no tenderness.  Musculoskeletal: She exhibits edema.  Lymphadenopathy:    She has no cervical adenopathy.  Neurological: She is alert and oriented to person, place, and time. She has normal strength. No cranial nerve deficit or sensory deficit.  Skin: Skin is warm and dry.  Psychiatric: She has a normal mood and affect.                           [facility]                        [address], [place] [address]                            [phone]  ------------------------------------------------------------------- Transthoracic Echocardiography  Patient:    Trudie, Cervantes MR #:       [id number] Study Date: 09/04/2016 Gender:     F Age:        71 Height:     162.6 cm Weight:     111.9 kg BSA:        2.31 m^2 Pt. Status: Room:   SONOGRAPHER  Cindy Hazy, [place] Croitoru, MD  [place] Croitoru, MD  ATTENDING    Lyman Bishop MD  PERFORMING   [facility], Outpatient  cc:  ------------------------------------------------------------------- LV EF: 60% -   65%  ------------------------------------------------------------------- Indications:      I35.9 Aortic Valve Disorder.  ------------------------------------------------------------------- History:   PMH:  Acquired from the patient and from the patient&'s chart.  PMH:  History of Breast Cancer. Chest pain. CHF. Murmur. Risk factors:  Hypertension. Diabetes mellitus. Obese. Dyslipidemia.  ------------------------------------------------------------------- Study Conclusions  - Left ventricle: The cavity size was normal. Wall thickness was   increased in a pattern of moderate LVH. Systolic function was   normal. The estimated ejection fraction was in the  range of 60%   to 65%. Wall motion was normal; there were no regional wall   motion abnormalities. Doppler parameters are consistent with   pseudonormal left ventricular relaxation (grade 2 diastolic   dysfunction). LV filling pressure is elevated. - Aortic valve: Moderately calcified leaflets. Severe stenosis.   Mean gradient (S): 46 mm Hg. Peak gradient (S): 81 mm Hg. Valve   area (Vmax): 0.72 cm^2. -  Mitral valve: Posterior calcified annulus. Mildly thickened   leaflets . - Left atrium: Moderately dilated. - Pulmonic valve: There was mild regurgitation. - Inferior vena cava: The vessel was dilated. The respirophasic   diameter changes were blunted (< 50%), consistent with elevated   central venous pressure.  Impressions:  - Compared to a prior study in 2017, there is now severe aortic   valve stenosis. AVA around 0.7 cm2 - mean gradient of 46 mmHg.   LVEF is stable at 60-65%.  ------------------------------------------------------------------- Study data:   Study status:  Routine.  Procedure:  The patient reported no pain pre or post test. Transthoracic echocardiography for left ventricular function evaluation, for right ventricular function evaluation, and for assessment of valvular function. Image quality was adequate.  Study completion:  There were no complications.          Transthoracic echocardiography.  M-mode, complete 2D, spectral Doppler, and color Doppler.  Birthdate: Patient birthdate: 05/29/1935.  Age:  Patient is 81 yr old.  Sex: Gender: female.    BMI: 42.3 kg/m^2.  Blood pressure:     158/73 Patient status:  Outpatient.  Study date:  Study date: 09/04/2016. Study time: 09:37 AM.  Location:  [facility]  -------------------------------------------------------------------  ------------------------------------------------------------------- Left ventricle:  The cavity size was normal. Wall thickness was increased in a pattern of moderate LVH. Systolic  function was normal. The estimated ejection fraction was in the range of 60% to 65%. Wall motion was normal; there were no regional wall motion abnormalities. Doppler parameters are consistent with pseudonormal left ventricular relaxation (grade 2 diastolic dysfunction). LV filling pressure is elevated.  ------------------------------------------------------------------- Aortic valve:  Moderately calcified leaflets. Severe stenosis. Doppler:     VTI ratio of LVOT to aortic valve: 0.3. Indexed valve area (VTI): 0.41 cm^2/m^2. Peak velocity ratio of LVOT to aortic valve: 0.23. Valve area (Vmax): 0.72 cm^2. Indexed valve area (Vmax): 0.31 cm^2/m^2. Mean velocity ratio of LVOT to aortic valve: 0.3. Valve area (Vmean): 0.94 cm^2. Indexed valve area (Vmean): 0.41 cm^2/m^2.    Mean gradient (S): 46 mm Hg. Peak gradient (S): 81 mm Hg.  ------------------------------------------------------------------- Aorta:  Aortic root: The aortic root was normal in size. Ascending aorta: The ascending aorta was normal in size.  ------------------------------------------------------------------- Mitral valve:  Posterior calcified annulus. Mildly thickened leaflets .  Doppler:  There was trivial regurgitation.    Peak gradient (D): 5 mm Hg.  ------------------------------------------------------------------- Left atrium:  Moderately dilated.  ------------------------------------------------------------------- Atrial septum:  No defect or patent foramen ovale was identified.   ------------------------------------------------------------------- Pulmonic valve:    Doppler:  There was mild regurgitation.  ------------------------------------------------------------------- Tricuspid valve:   Doppler:  There was no significant regurgitation.  ------------------------------------------------------------------- Pulmonary artery:   The main pulmonary artery was  normal-sized.  ------------------------------------------------------------------- Right atrium:  The atrium was at the upper limits of normal in size.  ------------------------------------------------------------------- Pericardium:  There was no pericardial effusion.  ------------------------------------------------------------------- Systemic veins: Inferior vena cava: The vessel was dilated. The respirophasic diameter changes were blunted (< 50%), consistent with elevated central venous pressure.  ------------------------------------------------------------------- Measurements   Left ventricle                            Value          Reference  LV ID, ED, PLAX chordal                   48.2  mm  43 - 52  LV ID, ES, PLAX chordal                   32.9  mm       23 - 38  LV fx shortening, PLAX chordal            32    %        >=29  LV PW thickness, ED                       13.7  mm       ---------  IVS/LV PW ratio, ED                       1.09           <=1.3  Stroke volume, 2D                         85    ml       ---------  Stroke volume/bsa, 2D                     37    ml/m^2   ---------  LV e&', lateral                            10.4  cm/s     ---------  LV E/e&', lateral                          10.58          ---------  LV e&', medial                             6.36  cm/s     ---------  LV E/e&', medial                           17.3           ---------  LV e&', average                            8.38  cm/s     ---------  LV E/e&', average                          13.13          ---------    Ventricular septum                        Value          Reference  IVS thickness, ED                         15    mm       ---------    LVOT                                      Value          Reference  LVOT ID, S  20    mm       ---------  LVOT area                                 3.14  cm^2     ---------  LVOT ID                                    20    mm       ---------  LVOT peak velocity, S                     103   cm/s     ---------  LVOT mean velocity, S                     81.5  cm/s     ---------  LVOT VTI, S                               27    cm       ---------  LVOT peak gradient, S                     4     mm Hg    ---------  Stroke volume (SV), LVOT DP               84.8  ml       ---------  Stroke index (SV/bsa), LVOT DP            36.7  ml/m^2   ---------    Aortic valve                              Value          Reference  Aortic valve peak velocity, S             449   cm/s     ---------  Aortic valve mean velocity, S             272   cm/s     ---------  Aortic valve VTI, S                       90.4  cm       ---------  Aortic mean gradient, S                   33    mm Hg    ---------  Aortic peak gradient, S                   81    mm Hg    ---------  VTI ratio, LVOT/AV                        0.3            ---------  Aortic valve area/bsa, VTI                0.41  cm^2/m^2 ---------  Velocity ratio, peak, LVOT/AV             0.23           ---------  Aortic valve area, peak velocity          0.72  cm^2     ---------  Aortic valve area/bsa, peak               0.31  cm^2/m^2 ---------  velocity  Velocity ratio, mean, LVOT/AV             0.3            ---------  Aortic valve area, mean velocity          0.94  cm^2     ---------  Aortic valve area/bsa, mean               0.41  cm^2/m^2 ---------  velocity    Aorta                                     Value          Reference  Aortic root ID, ED                        26    mm       ---------  Ascending aorta ID, A-P, S                35    mm       ---------    Left atrium                               Value          Reference  LA ID, A-P, ES                            52    mm       ---------  LA ID/bsa, A-P                    (H)     2.25  cm/m^2   <=2.2  LA volume, S                              89    ml       ---------  LA volume/bsa, S                           38.6  ml/m^2   ---------  LA volume, ES, 1-p A4C                    89    ml       ---------  LA volume/bsa, ES, 1-p A4C                38.6  ml/m^2   ---------  LA volume, ES, 1-p A2C                    89    ml       ---------  LA volume/bsa, ES, 1-p A2C                38.6  ml/m^2   ---------    Mitral valve  Value          Reference  Mitral E-wave peak velocity               110   cm/s     ---------  Mitral A-wave peak velocity               87.9  cm/s     ---------  Mitral deceleration time                  183   ms       150 - 230  Mitral peak gradient, D                   5     mm Hg    ---------  Mitral E/A ratio, peak                    1.3            ---------    Right ventricle                           Value          Reference  RV s&', lateral, S                         16.3  cm/s     ---------  Legend: (L)  and  (H)  mark values outside specified reference range.  ------------------------------------------------------------------- Prepared and Electronically Authenticated by  Lyman Bishop MD 2018-04-10T10:23:15  [name]  Cardiac catheterization  Order# [id number]  Reading physician: Burnell Blanks, MD Ordering physician: Burnell Blanks, MD Study date: 12/17/16  Physicians   Panel Physicians Referring Physician Case Authorizing Physician  Burnell Blanks, MD (Primary)    Procedures   Left Heart Cath and Coronary Angiography  Conclusion     Prox Cx to Mid Cx lesion, 99 %stenosed.  Ost 2nd Diag to 2nd Diag lesion, 70 %stenosed.  Mid LAD to Dist LAD lesion, 90 %stenosed.  Ost RCA to Prox RCA lesion, 40 %stenosed.  Mid RCA lesion, 80 %stenosed.  There is severe aortic valve stenosis.   1. Severe triple vessel CAD 2. Severe stenosis mid LAD involving a moderate caliber diagonal branch 3. Severe stenosis distal Circumflex artery 4. Severe stenosis mid RCA 5.  Severe aortic valve stenosis (mean gradient 32 mmHg, peak to peak gradient 40 mmHg)  Recommendations: Will admit to telemetry today for diuresis. She is grossly volume overloaded on exam. I was unable to obtain access for a right heart catheterization today. I will begin diuresis with IV Lasix and follow renal function closely. I will ask CT surgery to see her today to discuss CABG and AVR but I do not think she is a good candidate for a complex surgical procedure. If she is not felt to be a surgical candidate, will plan PCI of the LAD, Circumflex and RCA later this week and then continue planning for TAVR. If we bring her back later this week for PCI, will plan right heart cath at that time from right antecubital approach which may be easier if we can unload volume.    Indications   Severe aortic valve stenosis [I35.0 (ICD-10-CM)]  Coronary artery disease involving native coronary artery of native heart with unstable angina pectoris ([place]) [I25.110 (ICD-10-CM)]  Procedural Details/Technique   Technical Details Indication: Severe AS, dyspnea  c/w unstable angina  Procedure: The risks, benefits, complications, treatment options, and expected outcomes were discussed with the patient. The patient and/or family concurred with the proposed plan, giving informed consent. The patient was brought to the cath lab after IV hydration was given. The patient was further sedated with Versed and Fentanyl. We were unable to obtain access in the right antecubital vein. The right groin was prepped and draped in the usual manner. I attempted to place a sheath in the right femoral vein but was unsuccessful. A 5 French sheath as placed in the right femoral artery. Standard diagnostic catheters were used to perform selective coronary angiography. I was able to cross the aortic valve with an AL-1 catheter and a straight wire. LV pressures measured. No LV gram. I was unable to obtain venous access. I could not visualize a left  femoral vein with U/S.   There were no immediate complications. The patient was taken to the recovery area in stable condition.    Estimated blood loss <50 mL.  During this procedure the patient was administered the following to achieve and maintain moderate conscious sedation: Versed 2 mg, Fentanyl 50 mcg, while the patient's heart rate, blood pressure, and oxygen saturation were continuously monitored. The period of conscious sedation was 68 minutes, of which I was present face-to-face 100% of this time.    Complications   Complications documented before study signed (12/17/2016 8:57 AM EDT)    LEFT HEART CATH AND CORONARY ANGIOGRAPHY   None Documented by Burnell Blanks, MD 12/17/2016 8:57 AM EDT  Time Range: Intra-procedure      Coronary Findings   Dominance: Right  Left Anterior Descending  Vessel is large.  Mid LAD to Dist LAD lesion, 90% stenosed.  Second Diagonal Branch  Vessel is moderate in size.  Ost 2nd Diag to 2nd Diag lesion, 70% stenosed.  Second Septal Branch  Vessel is small in size.  Third Diagonal Branch  Vessel is small in size.  Third Septal Branch  Vessel is small in size.  Ramus Intermedius  Vessel is moderate in size.  Left Circumflex  Vessel is large.  Prox Cx to Mid Cx lesion, 99% stenosed.  First Obtuse Marginal Branch  Vessel is small in size.  Second Obtuse Marginal Branch  Vessel is small in size.  Third Obtuse Marginal Branch  Vessel is moderate in size.  Right Coronary Artery  Ost RCA to Prox RCA lesion, 40% stenosed.  Mid RCA lesion, 80% stenosed.  Left Heart   Aortic Valve There is severe aortic valve stenosis. The aortic valve is calcified.    Coronary Diagrams   Diagnostic Diagram       Implants     No implant documentation for this case.  PACS Images   Show images for Cardiac catheterization   Link to Procedure Log   Procedure Log    Hemo Data    Most Recent Value  Aortic Mean Gradient 40.2 mmHg   Aortic Peak Gradient 32 mmHg  AO Systolic Pressure 300 mmHg  AO Diastolic Pressure 65 mmHg  AO Mean 99 mmHg  LV Systolic Pressure 511 mmHg  LV Diastolic Pressure 10 mmHg  LV EDP 24 mmHg  Arterial Occlusion Pressure Extended Systolic Pressure 021 mmHg  Arterial Occlusion Pressure Extended Diastolic Pressure 68 mmHg  Arterial Occlusion Pressure Extended Mean Pressure 106 mmHg  Left Ventricular Apex Extended Systolic Pressure 117 mmHg  Left Ventricular Apex Extended Diastolic Pressure 7 mmHg  Left Ventricular Apex Extended EDP Pressure  27 mmHg     Assessment/Plan:  This 81 year old woman has stage D severe symptomatic aortic stenosis and severe 3-vessel coronary artery disease with NYHA class III symptoms of progressive exertional fatigue and shortness of breath consistent with chronic diastolic heart failure. I think she would be at high risk for open surgical AVR and CABG given her age, stage III CKD, DM, morbid obesity and other medical risk factors. I think she is an operative candidate but would have high risk and a long slow recovery. The other option would be PCI of her CAD and then TAVR for her severe AS. Either way her risk of worsening renal failure is increased due to the need for IV contrast vs a prolonged pump run. I discussed the options with her and she has no interest in open surgery. She says that she is the sole caregiver for her husband who can't ambulate due to neuropathy. She would rather have PCI and TAVR. She also may have an abscessed tooth that will need to be evaluated further by Dr. Enrique Sack while here and if it requires extraction that should be done before she gets on Plavix. I will discuss the case further with Dr. Angelena Form and if a decision is made to proceed with PCI and TAVR then timing of further TAVR workup CTA studies will be delayed until her renal function is stable after PCI. Hopefully her renal function will improve off ACE I.   I spent 60 minutes performing  this consultation and > 50% of this time was spent face to face counseling and coordinating the care of this patient's severe aortic stenosis and multi-vessel coronary artery disease.  Gaye Pollack 12/18/2016, 1:34 PM

## 2016-12-18 NOTE — Progress Notes (Signed)
Report received in patient's room via Otila Kluver RN using SBAR format, reviewed VS, PMH, tests and patient's right groin site, assumed care of patient.

## 2016-12-18 NOTE — Care Management (Signed)
1549 12-18-16 [name], RN,BSN [phone] CM did speak with pt in regards to concerns about her husband being home alone. Pt is the primary caregiver for her husband. Pt has children/ neighbors that are checking in on the husband. CM did provide pt with [facility] List that is an out of pocket fee if pt/ husband does not have long term care policy. Pt stated she would have her daughter to call the agencies for availability. CM did state that if pt needs Skilled Services ex PT, RN pt will have to contact the husbands PCP- to get orders written. Pt appreciative of information provided. No further needs at this time. Bethena Roys, RN,BSN, Case Manager [phone]

## 2016-12-18 NOTE — Progress Notes (Signed)
TAVR team note:   Carrie Monroe is stable this afternoon. She is likely not a candidate for CABG/AVR. She will need diuresis this week and close monitoring of her H/H. I would anticipate PCI/stenting of the LAD and circumflex next week if renal function is stable. Will need to use radial approach for PCI. Given renal insufficiency, will likely have to stage CT scans in TAVR workup. She is still volume overloaded and will need continued diuresis this week.   Lauree Chandler 12/18/2016 4:19 PM

## 2016-12-18 NOTE — Progress Notes (Signed)
Progress Note  Patient Name: Carrie Monroe Date of Encounter: 12/18/2016  Primary Cardiologist: Dr. Jerilynn Mages. [place]   Subjective   No chest pain and no SOB but not walking.  No rt groin pain.  Inpatient Medications    Scheduled Meds: . aspirin  81 mg Oral Daily  . furosemide  40 mg Intravenous BID  . hydrALAZINE  25 mg Oral Q8H  . insulin aspart  0-15 Units Subcutaneous TID WC  . metoprolol tartrate  25 mg Oral BID  . rosuvastatin  10 mg Oral Daily  . sodium chloride flush  3 mL Intravenous Q12H  . tamoxifen  20 mg Oral q morning - 10a   Continuous Infusions: . sodium chloride     PRN Meds: sodium chloride, acetaminophen, ondansetron (ZOFRAN) IV, sodium chloride flush   Vital Signs    Vitals:   12/18/16 0500 12/18/16 0546 12/18/16 0900 12/18/16 1050  BP: (!) 107/47 99/72 (!) 137/27 139/67  Pulse: (!) 59  63 60  Resp: 14     Temp: 99 F (37.2 C)   98.3 F (36.8 C)  TempSrc: Oral   Oral  SpO2: 96%   95%  Weight: 257 lb 11.2 oz (116.9 kg)     Height:        Intake/Output Summary (Last 24 hours) at 12/18/16 1103 Last data filed at 12/18/16 1038  Gross per 24 hour  Intake             1175 ml  Output             1700 ml  Net             -525 ml   Filed Weights   12/17/16 0544 12/18/16 0500  Weight: 251 lb (113.9 kg) 257 lb 11.2 oz (116.9 kg)    Telemetry    SR - Personally Reviewed  ECG    SR and no acute changes  yesterday - Personally Reviewed  Physical Exam   GEN: No acute distress.   Neck: No JVD Cardiac: RRR, + 3/6 aortic murmur, no rubs, or gallops.  Respiratory: Clear to auscultation bilaterally ant. only. GI: Soft, nontender, non-distended  MS: No to 1+ edema; rt groin with ecchymosis and soft hematoma. Neuro:  Nonfocal  Psych: Normal affect   Labs    Chemistry Recent Labs Lab 12/13/16 1600 12/18/16 0230  NA 141 138  K 4.5 4.6  CL 103 109  CO2 23 23  GLUCOSE 154* 176*  BUN 34* 36*  CREATININE 1.37* 1.76*  CALCIUM 9.6  9.1  PROT  --  5.3*  ALBUMIN  --  2.9*  AST  --  19  ALT  --  12*  ALKPHOS  --  48  BILITOT  --  0.8  GFRNONAA 37* 27*  GFRAA 43* 31*  ANIONGAP  --  6     Hematology Recent Labs Lab 12/18/16 0230 12/18/16 0718  WBC 7.2  --   RBC 2.56*  --   HGB 7.9* 8.2*  HCT 24.5* 25.3*  MCV 95.7  --   MCH 30.9  --   MCHC 32.2  --   RDW 14.7  --   PLT 121*  --     Cardiac EnzymesNo results for input(s): TROPONINI in the last 168 hours. No results for input(s): TROPIPOC in the last 168 hours.   BNPNo results for input(s): BNP, PROBNP in the last 168 hours.   DDimer No results for input(s): DDIMER in the last  168 hours.   Radiology    Ct Abdomen Pelvis Wo Contrast  Result Date: 12/18/2016 CLINICAL DATA:  81 year old female post cardiac catheterization with right groin hematoma. Low hemoglobin. Initial encounter. EXAM: CT ABDOMEN AND PELVIS WITHOUT CONTRAST TECHNIQUE: Multidetector CT imaging of the abdomen and pelvis was performed following the standard protocol without IV contrast. COMPARISON:  None. FINDINGS: Lower chest: Right middle lobe 3.1 mm nodule (series 4, image 9 and series 7, image 52). Mild cardiomegaly. Aortic and mitral valve calcifications. Trace coronary artery calcifications. Skin thickening left breast incompletely assessed. Correlation with clinical exam and mammography may be considered. Per medical chart patient has a history of breast cancer. Hepatobiliary: Slightly lobulated liver without other findings of cirrhosis. Two small calcifications. Taking into account limitation by non contrast imaging, no worrisome hepatic lesion. Postcholecystectomy. Pancreas: Taking into account limitation by non contrast imaging, no mass or inflammation. Spleen: Taking into account limitation by non contrast imaging, no primary splenic lesion. The splenic artery is tortuous and calcified. Without contrast, cannot evaluate for the presence of an aneurysm. Prominent splenic renal collaterals  along the inferior aspect of the spleen. Adrenals/Urinary Tract: No hydronephrosis. Bilateral renal cysts. Taking into account limitation by non contrast imaging, no worrisome renal or adrenal lesion. Contrast filled urinary bladder without gross abnormality. Stomach/Bowel: No primary bowel inflammatory process. Portions of stomach and colon under distended. Moderate stool throughout the colon. Mild rectal prolapse. Vascular/Lymphatic: Hematoma right femoral artery/ vein region predominantly superficial to upper thigh musculature and involving subcutaneous region. No extension along the psoas muscle. No retroperitoneal hematoma. Atherosclerotic changes aorta without aneurysm. Atherosclerotic changes iliac artery is and femoral arteries. Scattered normal size lymph nodes. Reproductive: No obvious abnormality. Other: No free peritoneal air. Diastases rectus muscles with postsurgical changes/ scar. No bowel containing hernia. Right inguinal fat containing hernia. Musculoskeletal: Scoliosis lumbar spine with superimposed degenerative changes. Schmorl's node deformity of the sacrum. No focal compression fracture. IMPRESSION: Hematoma right femoral artery/ vein region predominantly superficial to upper thigh musculature and involving subcutaneous region. No extension along the psoas muscle. No retroperitoneal hematoma. Splenic artery is tortuous and calcified. Without contrast, cannot evaluate for the presence of an aneurysm. Prominent splenic renal collaterals along the inferior aspect of the spleen. Skin thickening left breast incompletely assessed. Correlation with clinical exam and mammography (if not recently performed) may be considered. Per medical chart patient has a history of breast cancer. Right middle lobe 3.1 mm nodule (series 4, image 9 and series 7, image 52). No follow-up needed if patient is low-risk. Non-contrast chest CT can be considered in 12 months if patient is high-risk. This recommendation follows  the consensus statement: Guidelines for Management of Incidental Pulmonary Nodules Detected on CT Images: From the Fleischner Society 2017; Radiology 2017; 284:228-243. Aortic Atherosclerosis (ICD10-I70.0). Electronically Signed   By: Genia Del M.D.   On: 12/18/2016 07:58    Cardiac Studies   Cath 12/17/16 Procedures   Left Heart Cath and Coronary Angiography  Conclusion     Prox Cx to Mid Cx lesion, 99 %stenosed.  Ost 2nd Diag to 2nd Diag lesion, 70 %stenosed.  Mid LAD to Dist LAD lesion, 90 %stenosed.  Ost RCA to Prox RCA lesion, 40 %stenosed.  Mid RCA lesion, 80 %stenosed.  There is severe aortic valve stenosis.   1. Severe triple vessel CAD 2. Severe stenosis mid LAD involving a moderate caliber diagonal branch 3. Severe stenosis distal Circumflex artery 4. Severe stenosis mid RCA 5. Severe aortic valve stenosis (mean gradient  32 mmHg, peak to peak gradient 40 mmHg)  Recommendations: Will admit to telemetry today for diuresis. She is grossly volume overloaded on exam. I was unable to obtain access for a right heart catheterization today. I will begin diuresis with IV Lasix and follow renal function closely. I will ask CT surgery to see her today to discuss CABG and AVR but I do not think she is a good candidate for a complex surgical procedure. If she is not felt to be a surgical candidate, will plan PCI of the LAD, Circumflex and RCA later this week and then continue planning for TAVR. If we bring her back later this week for PCI, will plan right heart cath at that time from right antecubital approach which may be easier if we can unload volume.       Patient Profile     81 y.o. female with history of chronic diastolic CHF, HTN, HLD, DM, gout, breast cancer and severe aortic stenosis who is here today for cardiac cath and will be admitted post cath for diuresis.    Assessment & Plan    CAD severe triple CAD- Dr Cyndia Bent to see today no current chest pain  Severe  AS- Dr. Cyndia Bent to see   Acute on chronic diastolic HF and due to AS with DOE neg 525 since admit.  On lasix 40 mg BID   Rt groin hematoma- CT without contrast with + hematoma but no retroperitoneal bleed  2+ pedal pulse  Acute blood loss anemia. Associated with hypotension. With drop in hgb from 10 to 7.9 plan  transfuse 1 u PRBCs  HLD on Crestor   Signed, Cecilie Kicks, NP  12/18/2016, 11:03 AM    I have seen, examined and evaluated the patient this PM along with Cecilie Kicks, NP.  After reviewing all the available data and chart, we discussed the patients laboratory, study & physical findings as well as symptoms in detail. I agree with her findings, examination as well as impression recommendations as per our discussion.    Sabiha is feeling relatively fine today. Her dyspnea is nonissue as long as she is not ambulating. She has a large right groin hematoma that is soft at present hematoma but no retroperitoneal hematoma noted on CT abdomen and pelvis. She did have significant drop in hemoglobin levels which are fleeting of at least 1 unit transfusion.  She has severe stenosis with multivessel coronary disease and is undergoing evaluation for CABG/AVR versus PCI/TAVR -- with significantly elevated LVEDP and right heart pressures yesterday, she was admitted for diuresis.  . I suspect her mild bump in creatinine is probably more related to her hemoglobin dropped, but we need to gently and monitor renal function with diuresis. I anticipate IV diuretics today, tomorrow and then likely convert to 80 mg by mouth daily the following day.  She is only on hydralazine (for afterload reduction in Lieu of ACE inhibitor with recent cath) along with Lopressor with relatively controlled blood pressure.  Awaiting Dr. Vivi Martens consultation to discuss pros and cons of surgical versus percutaneous revascularization/AVR.    Glenetta Hew, M.D., M.S. Interventional Cardiologist   Pager #  [phone] Phone # [phone] [address]. [place], [place] [address]

## 2016-12-18 NOTE — Progress Notes (Signed)
Inpatient Diabetes Program Recommendations  AACE/ADA: New Consensus Statement on Inpatient Glycemic Control (2015)  Target Ranges:  Prepandial:   less than 140 mg/dL      Peak postprandial:   less than 180 mg/dL (1-2 hours)      Critically ill patients:  140 - 180 mg/dL   Results for Carrie Monroe, Carrie Monroe (MRN [id number]) as of 12/18/2016 13:53  Ref. Range 12/17/2016 05:47 12/17/2016 09:05 12/17/2016 12:00 12/17/2016 16:32 12/17/2016 23:52 12/18/2016 07:33 12/18/2016 11:22  Glucose-Capillary Latest Ref Range: 65 - 99 mg/dL 173 (H) 187 (H) 222 (H) 230 (H) 182 (H) 177 (H) 258 (H)   Review of Glycemic Control  Diabetes history: DM 2 Outpatient Diabetes medications: Metformin 1000 mg BID Current orders for Inpatient glycemic control: Novolog Moderate Correction 0-15 units tid  Inpatient Diabetes Program Recommendations:    Glucose increases at meal times. Fasting glucose below 180 mg/dl. Patient eating 100% of meals. Please consider adding Novolog 5 units tid meal coverage in addition to correction scale if patient continues to eat at least 50% of meals.  Thanks,  Tama Headings RN, MSN, [facility] Inpatient Diabetes Coordinator Team Pager [phone] (8a-5p)

## 2016-12-18 NOTE — Progress Notes (Signed)
Called with drop in H/H, + hematoma post cath.  Will check CT abd pelvis without contrast and give 250 ns for lower BP, type and cross 2 units PRBC

## 2016-12-19 ENCOUNTER — Inpatient Hospital Stay (HOSPITAL_COMMUNITY): Payer: Medicare Other

## 2016-12-19 ENCOUNTER — Encounter (HOSPITAL_COMMUNITY): Payer: Medicare Other

## 2016-12-19 ENCOUNTER — Encounter (HOSPITAL_COMMUNITY): Payer: Self-pay | Admitting: Dentistry

## 2016-12-19 DIAGNOSIS — N183 Chronic kidney disease, stage 3 (moderate): Secondary | ICD-10-CM

## 2016-12-19 DIAGNOSIS — S301XXD Contusion of abdominal wall, subsequent encounter: Secondary | ICD-10-CM

## 2016-12-19 DIAGNOSIS — I350 Nonrheumatic aortic (valve) stenosis: Secondary | ICD-10-CM

## 2016-12-19 DIAGNOSIS — I6521 Occlusion and stenosis of right carotid artery: Secondary | ICD-10-CM

## 2016-12-19 DIAGNOSIS — Z01818 Encounter for other preprocedural examination: Secondary | ICD-10-CM

## 2016-12-19 DIAGNOSIS — I2510 Atherosclerotic heart disease of native coronary artery without angina pectoris: Secondary | ICD-10-CM

## 2016-12-19 DIAGNOSIS — Z01810 Encounter for preprocedural cardiovascular examination: Secondary | ICD-10-CM

## 2016-12-19 LAB — VAS US CAROTID
LCCADSYS: 158 cm/s
LEFT ECA DIAS: 0 cm/s
LEFT VERTEBRAL DIAS: 4 cm/s
Left CCA dist dias: 23 cm/s
Left CCA prox dias: 40 cm/s
Left CCA prox sys: 169 cm/s
Left ICA dist dias: -52 cm/s
Left ICA dist sys: -159 cm/s
Left ICA prox dias: -33 cm/s
Left ICA prox sys: -161 cm/s
RCCADSYS: -154 cm/s
RCCAPDIAS: -9 cm/s
RIGHT VERTEBRAL DIAS: 22 cm/s
Right CCA prox sys: -202 cm/s

## 2016-12-19 LAB — BASIC METABOLIC PANEL WITH GFR
Anion gap: 7 (ref 5–15)
BUN: 45 mg/dL — ABNORMAL HIGH (ref 6–20)
CO2: 24 mmol/L (ref 22–32)
Calcium: 8.9 mg/dL (ref 8.9–10.3)
Chloride: 105 mmol/L (ref 101–111)
Creatinine, Ser: 1.88 mg/dL — ABNORMAL HIGH (ref 0.44–1.00)
GFR calc Af Amer: 28 mL/min — ABNORMAL LOW
GFR calc non Af Amer: 24 mL/min — ABNORMAL LOW
Glucose, Bld: 173 mg/dL — ABNORMAL HIGH (ref 65–99)
Potassium: 4.1 mmol/L (ref 3.5–5.1)
Sodium: 136 mmol/L (ref 135–145)

## 2016-12-19 LAB — GLUCOSE, CAPILLARY
GLUCOSE-CAPILLARY: 281 mg/dL — AB (ref 65–99)
GLUCOSE-CAPILLARY: 158 mg/dL — AB (ref 65–99)
Glucose-Capillary: 175 mg/dL — ABNORMAL HIGH (ref 65–99)
Glucose-Capillary: 224 mg/dL — ABNORMAL HIGH (ref 65–99)

## 2016-12-19 LAB — CBC
HCT: 24.4 % — ABNORMAL LOW (ref 36.0–46.0)
Hemoglobin: 8.3 g/dL — ABNORMAL LOW (ref 12.0–15.0)
MCH: 31.8 pg (ref 26.0–34.0)
MCHC: 34 g/dL (ref 30.0–36.0)
MCV: 93.5 fL (ref 78.0–100.0)
Platelets: 106 10*3/uL — ABNORMAL LOW (ref 150–400)
RBC: 2.61 MIL/uL — ABNORMAL LOW (ref 3.87–5.11)
RDW: 15.7 % — ABNORMAL HIGH (ref 11.5–15.5)
WBC: 6.5 10*3/uL (ref 4.0–10.5)

## 2016-12-19 LAB — PREPARE RBC (CROSSMATCH)

## 2016-12-19 LAB — HEMOGLOBIN AND HEMATOCRIT, BLOOD
HEMATOCRIT: 30.1 % — AB (ref 36.0–46.0)
Hemoglobin: 9.9 g/dL — ABNORMAL LOW (ref 12.0–15.0)

## 2016-12-19 MED ORDER — SODIUM CHLORIDE 0.9 % IV SOLN: Freq: Once | INTRAVENOUS | Status: DC

## 2016-12-19 NOTE — Progress Notes (Signed)
Physical Therapy - Pre-TAVR evaluation  (see PT evaluation as well)   6 Minute Walk Test  Distance - 480 ft    (2 standing rest breaks)      Pre    Post  BP     138/41    146/53  HR    56    76 (high 83)  SaO2    98    96  Modified Borg Dyspnea 0    7  RPE    6    19    5  Meter Walk Test  Trial   1) 7.54 sec   2) 6.82 sec  3) 6.42 sec  Average - 6.93 sec = 2.37 ft/sec   Clinical Frailty Scale - 5    Carrie Monroe. Carrie Monroe, [place] Pager [phone]

## 2016-12-19 NOTE — Evaluation (Signed)
Physical Therapy Evaluation Patient Details Name: Carrie Monroe MRN: [id number] DOB: 06/08/1935 Today's Date: 12/19/2016   History of Present Illness  This is a 81 y.o. female who was admitted 2 days ago to have a cardiac catheterization prior to possible TAVR for severe aortic stenosis.  Her cardiac catheterization revealed severe 3 vessel CAD with 90% proximal to mid LAD, left Cx has 99% stenosis and RCA has 80% stenosis.    She was volume overloaded and admitted for diuresis.       Clinical Impression  Patient is functioning at supervision level with all mobility and gait.  Completed Pre-TAVR testing - see below and in progress note).  Encouraged patient to ambulate with nursing on unit. **MD:  Please re-order PT consult if needed following patient's procedures/surgeries.  Thank you.    Follow Up Recommendations No PT follow up;Supervision - Intermittent    Equipment Recommendations  None recommended by PT    Recommendations for Other Services       Precautions / Restrictions Precautions Precautions: None Restrictions Weight Bearing Restrictions: No      Mobility  Bed Mobility               General bed mobility comments: Patient in chair as PT entered room.  Transfers Overall transfer level: Needs assistance Equipment used: None Transfers: Sit to/from Stand Sit to Stand: Supervision         General transfer comment: No physical assist needed.  Supervision for safety only.  Ambulation/Gait Ambulation/Gait assistance: Min guard Ambulation Distance (Feet): 530 Feet Assistive device: None Gait Pattern/deviations: Step-through pattern;Decreased stride length;Drifts right/left;Wide base of support Gait velocity: decreased Gait velocity interpretation: Below normal speed for age/gender General Gait Details: Patient with slower gait speed, and occasionally unsteady.  Patient staggering x3 and able to self-correct.  No loss of balance.  Limited by DOE,  fatigue.  PRE-TAVR TESTING: 6 Minute Walk Test  Distance - 480 ft    (2 standing rest breaks)  (Average for age - 58 ft)                                                  Pre                                           Post  BP                                           138/41                                     146/53  HR                                           56  76 (high 83)  SaO2                                       98                                            96  Modified Borg Dyspnea          0                                              7  RPE                                         6                                              19    5  Meter Walk Test  Trial   1) 7.54 sec   2) 6.82 sec  3) 6.42 sec  Average - 6.93 sec = 2.37 ft/sec   Clinical Frailty Scale - 5     Stairs            Wheelchair Mobility    Modified Rankin (Stroke Patients Only)       Balance Overall balance assessment: Needs assistance         Standing balance support: No upper extremity supported Standing balance-Leahy Scale: Good                               Pertinent Vitals/Pain Pain Assessment: No/denies pain    Home Living Family/patient expects to be discharged to:: Private residence Living Arrangements: Spouse/significant other Available Help at Discharge: Family Type of Home: House                Prior Function Level of Independence: Independent               Hand Dominance   Dominant Hand: Right    Extremity/Trunk Assessment   Upper Extremity Assessment Upper Extremity Assessment: Overall WFL for tasks assessed;RUE deficits/detail RUE Deficits / Details: Decreased Rt shoulder ROM from prior injury    Lower Extremity Assessment Lower Extremity Assessment: Generalized weakness       Communication   Communication: No difficulties  Cognition  Arousal/Alertness: Awake/alert Behavior During Therapy: WFL for tasks assessed/performed Overall Cognitive Status: Within Functional Limits for tasks assessed                                 General Comments: Appears to be a little overwhelmed with test results and future procedures.      General Comments      Exercises     Assessment/Plan    PT Assessment Patent does not need any further PT services  PT Problem List         PT  Treatment Interventions      PT Goals (Current goals can be found in the Care Plan section)  Acute Rehab PT Goals PT Goal Formulation: All assessment and education complete, DC therapy    Frequency     Barriers to discharge        Co-evaluation               AM-PAC PT "6 Clicks" Daily Activity  Outcome Measure Difficulty turning over in bed (including adjusting bedclothes, sheets and blankets)?: None Difficulty moving from lying on back to sitting on the side of the bed? : None Difficulty sitting down on and standing up from a chair with arms (e.g., wheelchair, bedside commode, etc,.)?: None Help needed moving to and from a bed to chair (including a wheelchair)?: None Help needed walking in hospital room?: None Help needed climbing 3-5 steps with a railing? : A Little 6 Click Score: 23    End of Session   Activity Tolerance: Patient limited by fatigue Patient left: in chair;with call bell/phone within reach;with family/visitor present Nurse Communication: Mobility status PT Visit Diagnosis: Muscle weakness (generalized) (M62.81)    Time: 3846-6599 PT Time Calculation (min) (ACUTE ONLY): 36 min   Charges:   PT Evaluation $PT Eval Moderate Complexity: 1 Procedure PT Treatments $Gait Training: 8-22 mins   PT G Codes:        Carita Pian. Sanjuana Kava, Vp [facility] Acute Rehab Services Pager [place] 12/19/2016, 5:26 PM

## 2016-12-19 NOTE — Progress Notes (Signed)
Report given to Josh RN in patient's room using SBAR format, reviewed tests, VS, labs,PMH and patient's general condition, transferred care to him in stable condition.

## 2016-12-19 NOTE — Progress Notes (Signed)
Updated report received via Maynard RN using SBAR format, reviewed new orders and events of the day, will go see patient after [place] report and will assume patient care at that time.

## 2016-12-19 NOTE — Progress Notes (Signed)
*  PRELIMINARY RESULTS* Vascular Ultrasound Carotid Duplex (Doppler) has been completed.   The right internal carotid artery exhibits elevated velocities suggestive of 80-99% stenosis. The left internal carotid artery exhibits elevated velocities suggestive of upper range 1-39% versus low range 40-59% stenosis. Vertebral arteries are patent with antegrade flow.  12/19/2016 10:04 AM Maudry Mayhew, BS, RVT, RDCS, RDMS

## 2016-12-19 NOTE — Consult Note (Signed)
DENTAL CONSULTATION  Date of Consultation:  12/19/2016 Patient Name:   Carrie Monroe Date of Birth:   March 31, 1936 Medical Record Number: [id number]  VITALS: BP (!) 131/44   Pulse 60   Temp 98.5 F (36.9 C) (Oral)   Resp 12   Ht 5\' 3"  (1.6 m)   Wt 255 lb 3.2 oz (115.8 kg)   SpO2 99%   BMI 45.21 kg/m   CHIEF COMPLAINT: Patient referred by Dr. Angelena Form and Dr. Cyndia Bent for a dental consultation.  HPI: Carrie Monroe 81 year old female recently diagnosed with severe aortic stenosis, coronary artery disease, and carotid artery disease.  Patient with anticipated angioplasty and stenting by Dr. Angelena Form followed by aortic valve replacement with Dr. Cyndia Bent. Patient now seen as part of a medically necessary pre-heart valve surgery dental protocol to rule out dental infection that may affect the patient's systemic health and the anticipated heart valve surgery.  The patient currently denies acute toothaches, swellings, or abscesses. Patient was last seen by her primary dentist, Dr. Pia Mau in [place], approximately 1-2 months ago. She was being planned for extraction of remaining upper teeth and subsequent insertion of intermediate upper complete denture by patient report.  Patient was unaware of the plan for her lower teeth. Patient has a maxillary cast partial denture that "fits okay" after recent adjustment by Dr. Pia Mau. Patient denies having a lower partial denture. Patient denies having dental phobia.   PROBLEM LIST: Patient Active Problem List   Diagnosis Date Noted  . Unstable angina ([place]) 12/17/2016  . Coronary artery disease involving native coronary artery of native heart with unstable angina pectoris ([place])   . Essential hypertension 01/27/2016  . Stenosis of right carotid artery 01/27/2016  . Abnormal nuclear stress test 01/27/2016  . Morbid obesity due to excess calories ([place]) 01/27/2016  . S/P total knee arthroplasty 08/13/2013  . Hyponatremia 08/04/2013  .  Postoperative anemia due to acute blood loss 08/04/2013  . Diabetes mellitus type II, controlled ([place]) 08/04/2013  . Congestive heart failure, unspecified 08/04/2013  . OA (osteoarthritis) of knee 08/03/2013  . Carotid artery bruit 07/22/2013  . Bilateral leg cramps 07/22/2013  . Osteoarthritis of knee 07/22/2013  . Chest pain on exertion 06/22/2013  . Gout 06/22/2013  . Dyspnea on exertion 10/09/2012  . Pure hypercholesterolemia 05/08/2011  . Diabetes mellitus type 2 in obese ([place]) 05/08/2011  . Chronic diastolic heart failure ([place]) 05/08/2011  . Severe aortic stenosis 05/08/2011  . Breast cancer of upper-outer quadrant of left female breast ([place]) 03/16/2011    PMH: Past Medical History:  Diagnosis Date  . Anxiety   . Aortic stenosis   . Arthritis    oa left knee  . Bilateral lower extremity edema   . Breast cancer ([place])   . Cancer [facility])    left breast CANCER, LUMPECTOMY AND RADIATION  . Carotid artery stenosis    bilateral  . Chest pain on exertion   . CHF (congestive heart failure) ([place])   . Coronary artery disease   . Decreased range of motion    RIGHT ARM - HX OF SHOULDER SURGERY AND LIMITED ABILTIY TO RAISE RT ARM ABOVE HEAD  . Depression   . Diabetes mellitus    DIET CONTROL  . Exogenous obesity   . Gout   . Heart murmur    hx mild aortic stenosis  . Hemorrhoids   . History of kidney stones   . Hyperlipidemia   . Hypertension   . Leg cramps  BILATERAL  . Shingles    YRS AGO - NO RESIDUAL PROBLEMS  . Shortness of breath    if she does not take her lasix and with exertion  . Stenosis of right carotid artery 01/27/2016  . Unstable angina ([facility])     PSH: Past Surgical History:  Procedure Laterality Date  . BREAST LUMPECTOMY  2009   left  . BREAST LUMPECTOMY WITH NEEDLE LOCALIZATION Left 01/06/2015   Procedure: BREAST BIOPSY WITH NEEDLE LOCALIZATION AND SKIN BIOPSY;  Surgeon: Autumn Messing III, MD;  Location: [facility];  Service: General;   Laterality: Left;  . CATARACT EXTRACTION    . CHOLECYSTECTOMY  2010  . EYE SURGERY     BILATERAL CATARACT EXTRACTIONS AND LENS IMPLANTS  . KNEE SURGERY     right PARTIAL KNEE REPLACEMENT  . LEFT HEART CATH AND CORONARY ANGIOGRAPHY N/A 12/17/2016   Procedure: Left Heart Cath and Coronary Angiography;  Surgeon: Burnell Blanks, MD;  Location: [facility];  Service: Cardiovascular;  Laterality: N/A;  . SHOULDER SURGERY     right  . SKIN GRAFT TO RIGHT HAND     AFTER BURN TO THE HAND  . TOTAL KNEE ARTHROPLASTY Left 08/03/2013   Procedure: TOTAL LEFT KNEE ARTHROPLASTY;  Surgeon: Gearlean Alf, MD;  Location: [facility];  Service: Orthopedics;  Laterality: Left;  . US ECHOCARDIOGRAPHY  05/15/2010   EF 60-65%    ALLERGIES: No Known Allergies  MEDICATIONS: Current Facility-Administered Medications  Medication Dose Route Frequency Provider Last Rate Last Dose  . 0.9 %  sodium chloride infusion  250 mL Intravenous PRN Burnell Blanks, MD      . 0.9 %  sodium chloride infusion   Intravenous Once Reino Bellis B, NP      . acetaminophen (TYLENOL) tablet 650 mg  650 mg Oral Q4H PRN Burnell Blanks, MD      . aspirin chewable tablet 81 mg  81 mg Oral Daily Burnell Blanks, MD   81 mg at 12/19/16 0949  . furosemide (LASIX) injection 40 mg  40 mg Intravenous BID Burnell Blanks, MD   40 mg at 12/19/16 0948  . hydrALAZINE (APRESOLINE) tablet 25 mg  25 mg Oral Q8H Burnell Blanks, MD   25 mg at 12/19/16 0554  . insulin aspart (novoLOG) injection 0-15 Units  0-15 Units Subcutaneous TID WC Burnell Blanks, MD   3 Units at 12/19/16 [phone]  . metoprolol tartrate (LOPRESSOR) tablet 25 mg  25 mg Oral BID Burnell Blanks, MD   25 mg at 12/19/16 0948  . ondansetron (ZOFRAN) injection 4 mg  4 mg Intravenous Q6H PRN Burnell Blanks, MD      . rosuvastatin (CRESTOR) tablet 10 mg  10 mg Oral Daily Burnell Blanks, MD   10 mg at  12/19/16 0949  . sodium chloride flush (NS) 0.9 % injection 3 mL  3 mL Intravenous Q12H Burnell Blanks, MD   3 mL at 12/18/16 2200  . sodium chloride flush (NS) 0.9 % injection 3 mL  3 mL Intravenous PRN Burnell Blanks, MD      . tamoxifen (NOLVADEX) tablet 20 mg  20 mg Oral q morning - 10a Burnell Blanks, MD   20 mg at 12/19/16 2979    LABS: Lab Results  Component Value Date   WBC 6.5 12/19/2016   HGB 8.3 (L) 12/19/2016   HCT 24.4 (L) 12/19/2016   MCV 93.5 12/19/2016  PLT 106 (L) 12/19/2016      Component Value Date/Time   NA 136 12/19/2016 0200   NA 141 12/13/2016 1600   NA 141 02/23/2015 1029   K 4.1 12/19/2016 0200   K 4.3 02/23/2015 1029   CL 105 12/19/2016 0200   CL 111 (H) 08/28/2012 1224   CO2 24 12/19/2016 0200   CO2 26 02/23/2015 1029   GLUCOSE 173 (H) 12/19/2016 0200   GLUCOSE 193 (H) 02/23/2015 1029   GLUCOSE 103 (H) 08/28/2012 1224   BUN 45 (H) 12/19/2016 0200   BUN 34 (H) 12/13/2016 1600   BUN 20.2 02/23/2015 1029   CREATININE 1.88 (H) 12/19/2016 0200   CREATININE 1.3 (H) 02/23/2015 1029   CALCIUM 8.9 12/19/2016 0200   CALCIUM 9.5 02/23/2015 1029   GFRNONAA 24 (L) 12/19/2016 0200   GFRAA 28 (L) 12/19/2016 0200   Lab Results  Component Value Date   INR 1.0 12/06/2016   INR 1.04 07/28/2013   No results found for: PTT  SOCIAL HISTORY: Social History   Social History  . Marital status: Married    Spouse name: N/A  . Number of children: 4  . Years of education: N/A   Occupational History  . 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 Smoking status: Never Smoker  . Smokeless tobacco: Never Used  . Alcohol use No  . Drug use: No  . Sexual activity: Not Currently   Other Topics Concern  . Not on file   Social History Narrative   Epworth Sleepiness scale score =11 as of 01/25/16    FAMILY HISTORY: Family History  Problem Relation Age of Onset  . Cancer Mother        pancreatic  . Heart  disease Father   . Hypertension Father   . Cancer Brother   . Diabetes Sister     REVIEW OF SYSTEMS: Reviewed with the patient as per History of present illness. Psych: Patient denies having dental phobia.  DENTAL HISTORY: CHIEF COMPLAINT: Patient referred by Dr. Angelena Form and Dr. Cyndia Bent for a dental consultation.  HPI: Carrie Monroe 81 year old female recently diagnosed with severe aortic stenosis, coronary artery disease, and carotid artery disease.  Patient with anticipated angioplasty and stenting by Dr. Angelena Form followed by aortic valve replacement with Dr. Cyndia Bent. Patient now seen as part of a medically necessary pre-heart valve surgery dental protocol to rule out dental infection that may affect the patient's systemic health and the anticipated heart valve surgery.  The patient currently denies acute toothaches, swellings, or abscesses. Patient was last seen by her primary dentist, Dr. Pia Mau in [place], approximately 1-2 months ago. She was being planned for extraction of remaining upper teeth and subsequent insertion of intermediate upper complete denture by patient report.  Patient was unaware of the plan for her lower teeth. Patient has a maxillary cast partial denture that "fits okay" after recent adjustment by Dr. Pia Mau. Patient denies having a lower partial denture. Patient denies having dental phobia.  DENTAL EXAMINATION: GENERAL: Patient is a well-developed, obese female in no acute distress. HEAD AND NECK: There is no palpable neck lymphadenopathy. The patient denies acute TMJ symptoms. INTRAORAL EXAM: Patient has normal saliva. There is no evidence of oral abscess formation. There is atrophy of the edentulous alveolar ridges. DENTITION: Patient with multiple missing teeth #'s 1,2,4-8, 13,15-20, 28, and 30-32. PERIODONTAL: The patient has chronic periodontitis with plaque and calculus accumulations, generalized gingival recession, and tooth mobility. There is  moderate to severe bone loss noted. DENTAL CARIES/SUBOPTIMAL RESTORATIONS: Patient has dental caries involving the retained root segment in the area of #12. ENDODONTIC: Patient currently denies acute pulpitis symptoms. Patient is a periapical pathology associated with the apex of tooth number 12 and possibly #29. CROWN AND BRIDGE: There are no obvious crown restorations noted. PROSTHODONTIC: Patient has a maxillary cast partial dentures that is less than ideal but "fits ok" by patient report. Patient denies having a lower partial denture. OCCLUSION: Patient has a poor occlusal scheme and malocclusion.  RADIOGRAPHIC INTERPRETATION: Orthopantogram was taken by radiology-today. There are multiple missing teeth. There is moderate to severe bone loss noted. Radiographic calculus is noted. Dental caries are noted. There is a retained root segment in the area #12. There is supra-eruption and drifting of the unopposed teeth into the edentulous areas. There is incipient periapical radiolucency associated with tooth numbers 12 and 29.   ASSESSMENTS: 1. Severe aortic stenosis 2. Coronary artery disease 3. Carotid artery disease 4. Pre-heart valve surgery dental protocol 5. Chronic apical periodontitis 5. Dental caries 6. Retained root segment  7. Chronic periodontitis of bone loss 8. Accretions 9. Gingival recession 10. Loose teeth 11. Multiple missing teeth 12. Less than ideal maxillary cast partial denture 13. Supra-eruption and drifting of the unopposed teeth into the edentulous areas 14. Poor occlusal scheme and malocclusion 15. Need for evaluation for antibiotic premedication prior to invasive dental procedures due to previous total knee replacement  16. Risk for Complications up to and including death with anticipated invasive dental procedures in the operating room 17. Risk for bleeding with invasive dental procedures secondary to current thrombocytopenia.   PLAN/RECOMMENDATIONS: 1. I  discussed the risks, benefits, and complications of various treatment options with the patient in relationship to her medical and dental conditions, anticipated angioplasty and stenting, anticipated heart valve surgery, and risk for endocarditis. We discussed various treatment options to include no treatment, multiple extractions with alveoloplasty, pre-prosthetic surgery as indicated, periodontal therapy, dental restorations, root canal therapy, crown and bridge therapy, implant therapy, and replacement of missing teeth as indicated. The patient currently wishes to proceed with extraction of tooth #12, 23, 24, 33, 26, and 29 with alveoloplasty and gross debridement of remaining dentition in the operating room with general anesthesia or monitored anesthesia care. I do not feel that extraction of remaining maxillary teeth is indicated at this time. Timing of these proposed dental procedures will be coordinated with Dr. Angelena Form, as the patient may need carotid intervention along with the anticipated angioplasty and stenting procedures. Due to the anticipated use of Plavix therapy after the stenting procedures, performing dental procedures prior to stenting may be prudent as Dr. Cyndia Bent has suggested in his consultation. I had a discussion with Dr. Angelena Form and he will contact Dental Medicine when she is able to proceed with the anticipated dental extraction procedures in the operating room.   2. Discussion of findings with medical team and coordination of future medical and dental care as needed.    Lenn Cal, DDS

## 2016-12-19 NOTE — Consult Note (Signed)
Hospital Consult    Reason for Consult:  Carotid stenosis Requesting Physician:  Reino Bellis, PA MRN #:  [id number]  History of Present Illness: This is a 81 y.o. female who was admitted 2 days ago to have a cardiac catheterization prior to possible TAVR for severe aortic stenosis.  Her cardiac catheterization revealed severe 3 vessel CAD with 90% proximal to mid LAD, left Cx has 99% stenosis and RCA has 80% stenosis.    She was volume overloaded and admitted for diuresis.   CT surgery discussed open vs TAVR and pt would like to go the TAVR route.  She does have CKD III and will be at increased risk for renal failure regardless of open vs PCI/TAVR.   Her creatinine today is up at 1.88  During her workup, she underwent carotid duplex, which revealed 80-99% stenosis of the right carotid artery.  VVS is consulted for possible intervention.  She denies amaurosis fugax, speech difficulty, or hemiplegia or weakness.  She states that her legs are swollen, but much better than they were.  She states she has never had swollen legs even with her 4 pregnancies.    She is right hand dominant.  She had an echo in April 2018 and has a LVEF of 60-65%.    She was on oral agents PTA for diabetes.  The pt is on a statin for cholesterol management.  She is now on a beta blocker.    She states that there is no family hx of stroke in her family.  Her father did have open heart surgery back in the 39's.  She has never smoked or drank.     Past Medical History:  Diagnosis Date  . Anxiety   . Aortic stenosis   . Arthritis    oa left knee  . Bilateral lower extremity edema   . Breast cancer ([place])   . Cancer [facility])    left breast CANCER, LUMPECTOMY AND RADIATION  . Carotid artery stenosis    bilateral  . Chest pain on exertion   . CHF (congestive heart failure) ([place])   . Coronary artery disease   . Decreased range of motion    RIGHT ARM - HX OF SHOULDER SURGERY AND LIMITED ABILTIY TO RAISE RT ARM  ABOVE HEAD  . Depression   . Diabetes mellitus    DIET CONTROL  . Exogenous obesity   . Gout   . Heart murmur    hx mild aortic stenosis  . Hemorrhoids   . History of kidney stones   . Hyperlipidemia   . Hypertension   . Leg cramps    BILATERAL  . Shingles    YRS AGO - NO RESIDUAL PROBLEMS  . Shortness of breath    if she does not take her lasix and with exertion  . Stenosis of right carotid artery 01/27/2016  . Unstable angina [facility])     Past Surgical History:  Procedure Laterality Date  . BREAST LUMPECTOMY  2009   left  . BREAST LUMPECTOMY WITH NEEDLE LOCALIZATION Left 01/06/2015   Procedure: BREAST BIOPSY WITH NEEDLE LOCALIZATION AND SKIN BIOPSY;  Surgeon: Autumn Messing III, MD;  Location: [place];  Service: General;  Laterality: Left;  . CATARACT EXTRACTION    . CHOLECYSTECTOMY  2010  . EYE SURGERY     BILATERAL CATARACT EXTRACTIONS AND LENS IMPLANTS  . KNEE SURGERY     right PARTIAL KNEE REPLACEMENT  . LEFT HEART CATH AND CORONARY ANGIOGRAPHY N/A 12/17/2016  Procedure: Left Heart Cath and Coronary Angiography;  Surgeon: Burnell Blanks, MD;  Location: [place] CV LAB;  Service: Cardiovascular;  Laterality: N/A;  . SHOULDER SURGERY     right  . SKIN GRAFT TO RIGHT HAND     AFTER BURN TO THE HAND  . TOTAL KNEE ARTHROPLASTY Left 08/03/2013   Procedure: TOTAL LEFT KNEE ARTHROPLASTY;  Surgeon: Gearlean Alf, MD;  Location: [facility];  Service: Orthopedics;  Laterality: Left;  . US ECHOCARDIOGRAPHY  05/15/2010   EF 60-65%    No Known Allergies  Prior to Admission medications   Medication Sig Start Date End Date Taking? Authorizing Provider  allopurinol (ZYLOPRIM) 300 MG tablet Take 300 mg by mouth daily as needed (gout).   Yes [provider]  Cholecalciferol (VITAMIN D) 2000 units CAPS Take 2,000 Units by mouth daily.   Yes [provider]  furosemide (LASIX) 40 MG tablet Take half tablet by mouth daily as needed for swelling  12/07/16  Yes Burnell Blanks, MD  lisinopril (PRINIVIL,ZESTRIL) 20 MG tablet Take 20 mg by mouth daily.  12/20/15  Yes [provider]  metFORMIN (GLUCOPHAGE) 1000 MG tablet Take 1,000 mg by mouth daily.    Yes [provider]  rosuvastatin (CRESTOR) 10 MG tablet Take 10 mg by mouth daily.  01/17/16  Yes [provider]  tamoxifen (NOLVADEX) 20 MG tablet Take 1 tablet by mouth every morning. 03/26/16  Yes Nicholas Lose, MD  naproxen sodium (ANAPROX) 220 MG tablet Take 220 mg by mouth daily as needed (pain).    [provider]    Social History   Social History  . Marital status: Married    Spouse name: N/A  . Number of children: 4  . Years of education: N/A   Occupational History  . 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 Smoking status: Never Smoker  . Smokeless tobacco: Never Used  . Alcohol use No  . Drug use: No  . Sexual activity: Not Currently   Other Topics Concern  . Not on file   Social History Narrative   Epworth Sleepiness scale score =11 as of 01/25/16     Family History  Problem Relation Age of Onset  . Cancer Mother        pancreatic  . Heart disease Father   . Hypertension Father   . Cancer Brother   . Diabetes Sister     ROS: [x]  Positive   [ ]  Negative   [ ]  All sytems reviewed and are negative  Cardiac: []  chest pain/pressure []  palpitations [x]  SOB lying flat [x]  DOE [x]  CHF/diastolic  Vascular: []  pain in legs while walking []  pain in legs at rest []  pain in legs at night []  non-healing ulcers []  hx of DVT [x]  swelling in legs  Pulmonary: []  productive cough []  asthma/wheezing []  home O2  Neurologic: []  weakness in []  arms []  legs []  numbness in []  arms []  legs []  hx of CVA []  mini stroke [] difficulty speaking or slurred speech []  temporary loss of vision in one eye []  dizziness  Hematologic: [x]  hx of cancer-breast []  bleeding problems []  problems with  blood clotting easily  Endocrine:   [x]  diabetes []  thyroid disease  GI []  vomiting blood []  blood in stool  GU: [x]  CKD/renal failure []  HD--[]  M/W/F or []  T/T/S []  burning with urination []  blood in urine [x]  hx kidney stones  Psychiatric: []  anxiety [x]  depression  Musculoskeletal: [x]  arthritis []  joint pain [x]  gout  Integumentary: []  rashes []  ulcers  Constitutional: []  fever []  chills   Physical Examination  Vitals:   12/19/16 0544 12/19/16 0554  BP: (!) 131/44 (!) 131/44  Pulse: 60   Resp:    Temp: 98.5 F (36.9 C)    Body mass index is 45.21 kg/m.  General:  WDWN in NAD Gait: Not observed HENT: WNL, normocephalic Pulmonary: normal non-labored breathing, without Rales, rhonchi,  wheezing Cardiac: regular, with systolic murmur; + bilateral carotid bruits, possibly radiating from murmur Abdomen:  soft, NT/ND, no masses Skin: without rashes Vascular Exam/Pulses:  Right Left  Radial 2+ (normal) 2+ (normal)  Ulnar 2+ (normal) 2+ (normal)  DP 2+ (normal) 2+ (normal)  PT Unable to palpate  Unable to palpate    Extremities: without ischemic changes, without Gangrene , without cellulitis; without open wounds; extensive ecchymosis right groin; BLE swelling Musculoskeletal: no muscle wasting or atrophy  Neurologic: A&O X 3;  No focal weakness or paresthesias are detected; speech is fluent/normal Psychiatric:  The pt has flat affect.   CBC    Component Value Date/Time   WBC 6.5 12/19/2016 0200   RBC 2.61 (L) 12/19/2016 0200   HGB 8.3 (L) 12/19/2016 0200   HGB 10.7 (L) 12/06/2016 1646   HGB 11.2 (L) 02/23/2015 1029   HCT 24.4 (L) 12/19/2016 0200   HCT 32.6 (L) 12/06/2016 1646   HCT 33.6 (L) 02/23/2015 1029   PLT 106 (L) 12/19/2016 0200   PLT 130 (L) 12/06/2016 1646   MCV 93.5 12/19/2016 0200   MCV 97 12/06/2016 1646   MCV 95.1 02/23/2015 1029   MCH 31.8 12/19/2016 0200   MCHC 34.0 12/19/2016 0200   RDW 15.7 (H) 12/19/2016 0200   RDW 13.9  12/06/2016 1646   RDW 12.9 02/23/2015 1029   LYMPHSABS 2.0 12/06/2016 1646   LYMPHSABS 1.3 02/23/2015 1029   MONOABS 0.3 02/23/2015 1029   EOSABS 0.3 12/06/2016 1646   BASOSABS 0.0 12/06/2016 1646   BASOSABS 0.0 02/23/2015 1029    BMET    Component Value Date/Time   NA 136 12/19/2016 0200   NA 141 12/13/2016 1600   NA 141 02/23/2015 1029   K 4.1 12/19/2016 0200   K 4.3 02/23/2015 1029   CL 105 12/19/2016 0200   CL 111 (H) 08/28/2012 1224   CO2 24 12/19/2016 0200   CO2 26 02/23/2015 1029   GLUCOSE 173 (H) 12/19/2016 0200   GLUCOSE 193 (H) 02/23/2015 1029   GLUCOSE 103 (H) 08/28/2012 1224   BUN 45 (H) 12/19/2016 0200   BUN 34 (H) 12/13/2016 1600   BUN 20.2 02/23/2015 1029   CREATININE 1.88 (H) 12/19/2016 0200   CREATININE 1.3 (H) 02/23/2015 1029   CALCIUM 8.9 12/19/2016 0200   CALCIUM 9.5 02/23/2015 1029   GFRNONAA 24 (L) 12/19/2016 0200   GFRAA 28 (L) 12/19/2016 0200    COAGS: Lab Results  Component Value Date   INR 1.0 12/06/2016   INR 1.04 07/28/2013     Non-Invasive Vascular Imaging:   Carotid Duplex (Doppler) has been completed 12/19/16  The right internal carotid artery exhibits elevated velocities suggestive of 80-99% stenosis. The left internal carotid artery exhibits elevated velocities suggestive of upper range 1-39% versus low range 40-59% stenosis. Vertebral arteries are patent with antegrade flow.  Statin:  Yes.   Beta Blocker:  Yes Aspirin:  No. ACEI:  Yes.   ARB:  No. CCB use:  No Other antiplatelets/anticoagulants:  No.  ASSESSMENT/PLAN: This is a 81 y.o. female with right carotid artery stenosis found during workup for PCI/TAVR   -pt has asymptomatic right carotid artery stenosis; given her severe AS and CAD, will consider elective right carotid endarterectomy once she is over PCI and TAVR.  Pt has 5% risk of stoke per year if she does not undergo carotid endarterectomy.  1-2% risk of stroke with carotid endarterectomy. -continue daily  aspirin/statin   Leontine Locket, PA-C [facility] [phone]   Agree with above.  Pt needs cardiac issues addressed prior to proceeding with CEA.  Indications for CEA or stenting prior to cardiac intervention would be carotid symptoms of TIA amaurosis or stroke or bilateral > 80% stenosis.    Will see electively after cardiac status stable  ASA daily and statin for now  Ruta Hinds, MD [facility] [place]: [phone] Pager: [phone]

## 2016-12-19 NOTE — Progress Notes (Signed)
Progress Note  Patient Name: Carrie Monroe Date of Encounter: 12/19/2016  Primary Cardiologist: Croitoru  Subjective   Breathing improved today. No chest pain  Inpatient Medications    Scheduled Meds: . aspirin  81 mg Oral Daily  . furosemide  40 mg Intravenous BID  . hydrALAZINE  25 mg Oral Q8H  . insulin aspart  0-15 Units Subcutaneous TID WC  . metoprolol tartrate  25 mg Oral BID  . rosuvastatin  10 mg Oral Daily  . sodium chloride flush  3 mL Intravenous Q12H  . tamoxifen  20 mg Oral q morning - 10a   Continuous Infusions: . sodium chloride     PRN Meds: sodium chloride, acetaminophen, ondansetron (ZOFRAN) IV, sodium chloride flush   Vital Signs    Vitals:   12/18/16 2303 12/18/16 2305 12/19/16 0544 12/19/16 0554  BP: 121/72 121/72 (!) 131/44 (!) 131/44  Pulse: 65  60   Resp:      Temp:   98.5 F (36.9 C)   TempSrc:   Oral   SpO2:   99%   Weight:   255 lb 3.2 oz (115.8 kg)   Height:        Intake/Output Summary (Last 24 hours) at 12/19/16 0944 Last data filed at 12/19/16 0541  Gross per 24 hour  Intake              916 ml  Output             3100 ml  Net            -2184 ml   Filed Weights   12/17/16 0544 12/18/16 0500 12/19/16 0544  Weight: 251 lb (113.9 kg) 257 lb 11.2 oz (116.9 kg) 255 lb 3.2 oz (115.8 kg)    Telemetry    SR - Personally Reviewed  ECG    N/a - Personally Reviewed  Physical Exam   General: Pleasant obese female appearing in no acute distress. Head: Normocephalic, atraumatic.  Neck: Supple without bruits, JVD. Lungs:  Resp regular and unlabored, CTA. Heart: RRR, S1, S2, no S3, S4, 3/6 systolic murmur; no rub. Abdomen: Soft, non-tender, non-distended with normoactive bowel sounds. No hepatomegaly. No rebound/guarding. No obvious abdominal masses. Extremities: No clubbing, cyanosis, edema. Distal pedal pulses are 2+ bilaterally. Right groin bruising.  Neuro: Alert and oriented X 3. Moves all extremities  spontaneously. Psych: Normal affect.  Labs    Chemistry Recent Labs Lab 12/13/16 1600 12/18/16 0230 12/19/16 0200  NA 141 138 136  K 4.5 4.6 4.1  CL 103 109 105  CO2 23 23 24   GLUCOSE 154* 176* 173*  BUN 34* 36* 45*  CREATININE 1.37* 1.76* 1.88*  CALCIUM 9.6 9.1 8.9  PROT  --  5.3*  --   ALBUMIN  --  2.9*  --   AST  --  19  --   ALT  --  12*  --   ALKPHOS  --  48  --   BILITOT  --  0.8  --   GFRNONAA 37* 27* 24*  GFRAA 43* 31* 28*  ANIONGAP  --  6 7     Hematology Recent Labs Lab 12/18/16 0230 12/18/16 0718 12/19/16 0200  WBC 7.2  --  6.5  RBC 2.56*  --  2.61*  HGB 7.9* 8.2* 8.3*  HCT 24.5* 25.3* 24.4*  MCV 95.7  --  93.5  MCH 30.9  --  31.8  MCHC 32.2  --  34.0  RDW 14.7  --  15.7*  PLT 121*  --  106*    Cardiac EnzymesNo results for input(s): TROPONINI in the last 168 hours. No results for input(s): TROPIPOC in the last 168 hours.   BNPNo results for input(s): BNP, PROBNP in the last 168 hours.   DDimer No results for input(s): DDIMER in the last 168 hours.    Radiology    Ct Abdomen Pelvis Wo Contrast  Result Date: 12/18/2016 CLINICAL DATA:  81 year old female post cardiac catheterization with right groin hematoma. Low hemoglobin. Initial encounter. EXAM: CT ABDOMEN AND PELVIS WITHOUT CONTRAST TECHNIQUE: Multidetector CT imaging of the abdomen and pelvis was performed following the standard protocol without IV contrast. COMPARISON:  None. FINDINGS: Lower chest: Right middle lobe 3.1 mm nodule (series 4, image 9 and series 7, image 52). Mild cardiomegaly. Aortic and mitral valve calcifications. Trace coronary artery calcifications. Skin thickening left breast incompletely assessed. Correlation with clinical exam and mammography may be considered. Per medical chart patient has a history of breast cancer. Hepatobiliary: Slightly lobulated liver without other findings of cirrhosis. Two small calcifications. Taking into account limitation by non contrast  imaging, no worrisome hepatic lesion. Postcholecystectomy. Pancreas: Taking into account limitation by non contrast imaging, no mass or inflammation. Spleen: Taking into account limitation by non contrast imaging, no primary splenic lesion. The splenic artery is tortuous and calcified. Without contrast, cannot evaluate for the presence of an aneurysm. Prominent splenic renal collaterals along the inferior aspect of the spleen. Adrenals/Urinary Tract: No hydronephrosis. Bilateral renal cysts. Taking into account limitation by non contrast imaging, no worrisome renal or adrenal lesion. Contrast filled urinary bladder without gross abnormality. Stomach/Bowel: No primary bowel inflammatory process. Portions of stomach and colon under distended. Moderate stool throughout the colon. Mild rectal prolapse. Vascular/Lymphatic: Hematoma right femoral artery/ vein region predominantly superficial to upper thigh musculature and involving subcutaneous region. No extension along the psoas muscle. No retroperitoneal hematoma. Atherosclerotic changes aorta without aneurysm. Atherosclerotic changes iliac artery is and femoral arteries. Scattered normal size lymph nodes. Reproductive: No obvious abnormality. Other: No free peritoneal air. Diastases rectus muscles with postsurgical changes/ scar. No bowel containing hernia. Right inguinal fat containing hernia. Musculoskeletal: Scoliosis lumbar spine with superimposed degenerative changes. Schmorl's node deformity of the sacrum. No focal compression fracture. IMPRESSION: Hematoma right femoral artery/ vein region predominantly superficial to upper thigh musculature and involving subcutaneous region. No extension along the psoas muscle. No retroperitoneal hematoma. Splenic artery is tortuous and calcified. Without contrast, cannot evaluate for the presence of an aneurysm. Prominent splenic renal collaterals along the inferior aspect of the spleen. Skin thickening left breast  incompletely assessed. Correlation with clinical exam and mammography (if not recently performed) may be considered. Per medical chart patient has a history of breast cancer. Right middle lobe 3.1 mm nodule (series 4, image 9 and series 7, image 52). No follow-up needed if patient is low-risk. Non-contrast chest CT can be considered in 12 months if patient is high-risk. This recommendation follows the consensus statement: Guidelines for Management of Incidental Pulmonary Nodules Detected on CT Images: From the Fleischner Society 2017; Radiology 2017; 284:228-243. Aortic Atherosclerosis (ICD10-I70.0). Electronically Signed   By: Genia Del M.D.   On: 12/18/2016 07:58   Dg Orthopantogram  Result Date: 12/19/2016 CLINICAL DATA:  Severe aortic stenosis.  Poor dentition EXAM: ORTHOPANTOGRAM/PANORAMIC COMPARISON:  None. FINDINGS: Atrophic changes in the body of the mandible bilaterally. No fracture or bone lesion. Bone loss around right lower pre molar. Lucency in the left upper premolar compatible with  caries. Multiple fillings. IMPRESSION: Mandibular bone loss and caries. Electronically Signed   By: Franchot Gallo M.D.   On: 12/19/2016 09:03    Cardiac Studies   Cath 12/17/16 Procedures   Left Heart Cath and Coronary Angiography  Conclusion     Prox Cx to Mid Cx lesion, 99 %stenosed.  Ost 2nd Diag to 2nd Diag lesion, 70 %stenosed.  Mid LAD to Dist LAD lesion, 90 %stenosed.  Ost RCA to Prox RCA lesion, 40 %stenosed.  Mid RCA lesion, 80 %stenosed.  There is severe aortic valve stenosis.  1. Severe triple vessel CAD 2. Severe stenosis mid LAD involving a moderate caliber diagonal branch 3. Severe stenosis distal Circumflex artery 4. Severe stenosis mid RCA 5. Severe aortic valve stenosis (mean gradient 32 mmHg, peak to peak gradient 40 mmHg)  Recommendations: Will admit to telemetry today for diuresis. She is grossly volume overloaded on exam. I was unable to obtain access for a  right heart catheterization today. I will begin diuresis with IV Lasix and follow renal function closely. I will ask CT surgery to see her today to discuss CABG and AVR but I do not think she is a good candidate for a complex surgical procedure. If she is not felt to be a surgical candidate, will plan PCI of the LAD, Circumflex and RCA later this week and then continue planning for TAVR. If we bring her back later this week for PCI, will plan right heart cath at that time from right antecubital approach which may be easier if we can unload volume.     Patient Profile     81 y.o. female with history of chronic diastolic CHF, HTN, HLD, DM, gout, breast cancer and severe aortic stenosis who is here today for cardiac cath and will be admitted post cath for diuresis.  Assessment & Plan    1. CAD: Multivessel disease noted on cath. Was seen by Dr. Cyndia Bent for CABG options, but felt best that she undergo staged PCI. No reports of chest pain. This will be planned possibly for next week pending her renal function and respiratory status.   2. Severe AS: Not a good candidate surgical candidate. Plans for possible TAVR once able to complete work up.   3. Acute on Chronic diastolic HF: diuresing well with IV lasix. Breathing much improved. UOP 3.1L yesterday. Weight trending down.  -- continue IV lasix  -- daily weights -- I&Os   4. Anemia: Hgb 7.9 with improvement to 8.3 after one unit. Will plan to additional unit PRBCs today.  -- daily CBC  5. Right groin hematoma: Improved today. Bruising noted. No RP bleed.   6. HL: on statin  7. CKD: Cr 1.76>>1.88. Follow closely while diuresing.  -- BMET daily   Signed, Reino Bellis, NP  12/19/2016, 9:44 AM

## 2016-12-19 NOTE — Plan of Care (Signed)
Problem: Education: Goal: Understanding of CV disease, CV risk reduction, and recovery process will improve Outcome: Progressing Reviewed with patient care of her incision site after discharge and when to call for assistance. Patient instructed on s/s of infection and how to look for bleeding and to get help. Patient verbalized understanding and answered questions appropriately.

## 2016-12-19 NOTE — Progress Notes (Signed)
Inpatient Diabetes Program Recommendations  AACE/ADA: New Consensus Statement on Inpatient Glycemic Control (2015)  Target Ranges:  Prepandial:   less than 140 mg/dL      Peak postprandial:   less than 180 mg/dL (1-2 hours)      Critically ill patients:  140 - 180 mg/dL   Results for [name], Carrie Monroe (MRN [id number]) as of 12/19/2016 14:47  Ref. Range 12/18/2016 07:33 12/18/2016 11:22 12/18/2016 16:55 12/18/2016 21:19  Glucose-Capillary Latest Ref Range: 65 - 99 mg/dL 177 (H) 258 (H) 158 (H) 235 (H)   Results for Carrie Monroe, Carrie Monroe (MRN [id number]) as of 12/19/2016 14:47  Ref. Range 12/19/2016 07:23 12/19/2016 11:50  Glucose-Capillary Latest Ref Range: 65 - 99 mg/dL 175 (H) 281 (H)    Home DM Meds: Metformin 1000 mg daily  Current Insulin Orders: Novolog Moderate Correction Scale/ SSI (0-15 units) TID AC     MD- Note patient is eating 100% of meals.  Having elevated postprandial glucose readings.  Please consider starting Novolog Meal Coverage while home Metformin on hold:  Novolog 5 units TID with meals (hold if pt eats <50% of meal)     --Will follow patient during hospitalization--  Wyn Quaker RN, MSN, CDE Diabetes Coordinator Inpatient Glycemic Control Team Team Pager: [phone] (8a-5p)

## 2016-12-19 NOTE — Plan of Care (Signed)
Problem: Cardiovascular: Goal: Ability to achieve and maintain adequate cardiovascular perfusion will improve Outcome: Progressing Patient's right groin site still remains ecchymotic but is soft and only slightly tender at the groin with palpation, no s/s of complications noted.

## 2016-12-19 NOTE — Plan of Care (Signed)
Problem: Safety: Goal: Ability to remain free from injury will improve Outcome: Completed/Met Date Met: 12/19/16 Pt educated on the importance of calling for assistance if patient needs to get up. Pt agreeable to notifying staff if any assistance is needed   

## 2016-12-20 ENCOUNTER — Inpatient Hospital Stay (HOSPITAL_COMMUNITY)
Admission: AD | Admit: 2016-12-20 | Discharge: 2016-12-20 | Disposition: A | Discharge status: Home / Self Care | Payer: Medicare Other | Source: Ambulatory Visit | Attending: Surgery | Admitting: Surgery

## 2016-12-20 DIAGNOSIS — I10 Essential (primary) hypertension: Secondary | ICD-10-CM

## 2016-12-20 DIAGNOSIS — E669 Obesity, unspecified: Secondary | ICD-10-CM

## 2016-12-20 DIAGNOSIS — I350 Nonrheumatic aortic (valve) stenosis: Secondary | ICD-10-CM

## 2016-12-20 DIAGNOSIS — E1169 Type 2 diabetes mellitus with other specified complication: Secondary | ICD-10-CM

## 2016-12-20 DIAGNOSIS — I5043 Acute on chronic combined systolic (congestive) and diastolic (congestive) heart failure: Secondary | ICD-10-CM

## 2016-12-20 LAB — TYPE AND SCREEN
ABO/RH(D): A POS
Antibody Screen: NEGATIVE
UNIT DIVISION: 0
Unit division: 0

## 2016-12-20 LAB — PULMONARY FUNCTION TEST
DL/VA % pred: 70 %
DL/VA: 3.24 ml/min/mmHg/L
DLCO UNC: 9.79 ml/min/mmHg
DLCO cor % pred: 53 %
DLCO cor: 11.83 ml/min/mmHg
DLCO unc % pred: 44 %
FEF 25-75 Post: 2.59 L/sec
FEF 25-75 Pre: 1.86 L/sec
FEF2575-%Change-Post: 39 %
FEF2575-%PRED-POST: 187 %
FEF2575-%Pred-Pre: 134 %
FEV1-%CHANGE-POST: 8 %
FEV1-%PRED-POST: 93 %
FEV1-%PRED-PRE: 86 %
FEV1-POST: 1.72 L
FEV1-Pre: 1.59 L
FEV1FVC-%Change-Post: 0 %
FEV1FVC-%Pred-Pre: 115 %
FEV6-%Change-Post: 8 %
FEV6-%PRED-PRE: 78 %
FEV6-%Pred-Post: 85 %
FEV6-POST: 2 L
FEV6-PRE: 1.85 L
FEV6FVC-%PRED-POST: 106 %
FEV6FVC-%PRED-PRE: 106 %
FVC-%CHANGE-POST: 7 %
FVC-%PRED-POST: 80 %
FVC-%PRED-PRE: 75 %
FVC-POST: 2.01 L
FVC-PRE: 1.87 L
POST FEV6/FVC RATIO: 100 %
PRE FEV6/FVC RATIO: 100 %
Post FEV1/FVC ratio: 86 %
Pre FEV1/FVC ratio: 85 %
RV % PRED: 92 %
RV: 2.11 L
TLC % pred: 84 %
TLC: 4.1 L

## 2016-12-20 LAB — BPAM RBC
BLOOD PRODUCT EXPIRATION DATE: 201808082359
Blood Product Expiration Date: 201808082359
ISSUE DATE / TIME: 201807241042
ISSUE DATE / TIME: 201807251124
UNIT TYPE AND RH: 6200
Unit Type and Rh: 6200

## 2016-12-20 LAB — CBC
HEMATOCRIT: 27.6 % — AB (ref 36.0–46.0)
Hemoglobin: 8.9 g/dL — ABNORMAL LOW (ref 12.0–15.0)
MCH: 29.4 pg (ref 26.0–34.0)
MCHC: 32.2 g/dL (ref 30.0–36.0)
MCV: 91.1 fL (ref 78.0–100.0)
Platelets: 117 10*3/uL — ABNORMAL LOW (ref 150–400)
RBC: 3.03 MIL/uL — ABNORMAL LOW (ref 3.87–5.11)
RDW: 16.4 % — ABNORMAL HIGH (ref 11.5–15.5)
WBC: 7.4 10*3/uL (ref 4.0–10.5)

## 2016-12-20 LAB — BASIC METABOLIC PANEL
ANION GAP: 8 (ref 5–15)
BUN: 50 mg/dL — ABNORMAL HIGH (ref 6–20)
CHLORIDE: 105 mmol/L (ref 101–111)
CO2: 24 mmol/L (ref 22–32)
Calcium: 9.2 mg/dL (ref 8.9–10.3)
Creatinine, Ser: 1.9 mg/dL — ABNORMAL HIGH (ref 0.44–1.00)
GFR calc non Af Amer: 24 mL/min — ABNORMAL LOW (ref >60)
GFR, EST AFRICAN AMERICAN: 28 mL/min — AB (ref >60)
Glucose, Bld: 169 mg/dL — ABNORMAL HIGH (ref 65–99)
Potassium: 4.1 mmol/L (ref 3.5–5.1)
Sodium: 137 mmol/L (ref 135–145)

## 2016-12-20 LAB — GLUCOSE, CAPILLARY
Glucose-Capillary: 191 mg/dL — ABNORMAL HIGH (ref 65–99)
Glucose-Capillary: 223 mg/dL — ABNORMAL HIGH (ref 65–99)
Glucose-Capillary: 193 mg/dL — ABNORMAL HIGH (ref 65–99)
Glucose-Capillary: 180 mg/dL — ABNORMAL HIGH (ref 65–99)

## 2016-12-20 MED ORDER — HYDRALAZINE HCL 50 MG PO TABS
50.0000 mg | ORAL_TABLET | Freq: Three times a day (TID) | ORAL | Status: DC
  Administered 2016-12-20 – 2016-12-22 (×5): 50 mg via ORAL
  Filled 2016-12-20 (×5): qty 1

## 2016-12-20 MED ORDER — CEFAZOLIN SODIUM-DEXTROSE 2-4 GM/100ML-% IV SOLN
2.0000 g | INTRAVENOUS | Status: AC
  Administered 2016-12-21: 2 g via INTRAVENOUS
  Filled 2016-12-20 (×3): qty 100

## 2016-12-20 MED ORDER — FUROSEMIDE 10 MG/ML IJ SOLN
40.0000 mg | Freq: Two times a day (BID) | INTRAMUSCULAR | Status: AC
  Administered 2016-12-20: 40 mg via INTRAVENOUS
  Filled 2016-12-20: qty 4

## 2016-12-20 MED ORDER — FUROSEMIDE 40 MG PO TABS
40.0000 mg | ORAL_TABLET | Freq: Two times a day (BID) | ORAL | Status: DC
  Administered 2016-12-21 – 2016-12-22 (×2): 40 mg via ORAL
  Filled 2016-12-20 (×2): qty 1

## 2016-12-20 MED ORDER — ALBUTEROL SULFATE (2.5 MG/3ML) 0.083% IN NEBU
2.5000 mg | INHALATION_SOLUTION | Freq: Once | RESPIRATORY_TRACT | Status: AC
  Administered 2016-12-20: 2.5 mg via RESPIRATORY_TRACT

## 2016-12-20 NOTE — Progress Notes (Signed)
Progress Note  Patient Name: Carrie Monroe Date of Encounter: 12/20/2016  Primary Cardiologist: Croitoru   Subjective   Feeling well this morning.   Inpatient Medications    Scheduled Meds: . aspirin  81 mg Oral Daily  . [START ON 12/21/2016] furosemide  40 mg Oral BID  . hydrALAZINE  50 mg Oral Q8H  . insulin aspart  0-15 Units Subcutaneous TID WC  . metoprolol tartrate  25 mg Oral BID  . rosuvastatin  10 mg Oral Daily  . sodium chloride flush  3 mL Intravenous Q12H  . tamoxifen  20 mg Oral q morning - 10a   Continuous Infusions: . sodium chloride    . sodium chloride     PRN Meds: sodium chloride, acetaminophen, ondansetron (ZOFRAN) IV, sodium chloride flush   Vital Signs    Vitals:   12/19/16 1154 12/19/16 1429 12/19/16 2107 12/20/16 0541  BP: (!) 141/59 (!) 132/59 (!) 167/77 (!) 147/59  Pulse: 68 62 63 62  Resp: 16 16 18 18   Temp: 98.2 F (36.8 C) 98.5 F (36.9 C) 98.4 F (36.9 C) 98.7 F (37.1 C)  TempSrc: Oral Oral Oral Oral  SpO2: 96% 98% 96% 95%  Weight:    253 lb 14.4 oz (115.2 kg)  Height:        Intake/Output Summary (Last 24 hours) at 12/20/16 1003 Last data filed at 12/20/16 0550  Gross per 24 hour  Intake              527 ml  Output             3600 ml  Net            -3073 ml   Filed Weights   12/18/16 0500 12/19/16 0544 12/20/16 0541  Weight: 257 lb 11.2 oz (116.9 kg) 255 lb 3.2 oz (115.8 kg) 253 lb 14.4 oz (115.2 kg)    Telemetry    SR - Personally Reviewed  ECG    N/a - Personally Reviewed  Physical Exam   General: Pleasant obese older female appearing in no acute distress. Head: Normocephalic, atraumatic.  Neck: Supple without bruits, JVD. Lungs:  Resp regular and unlabored, CTA. Heart: RRR, S1, S2, no S3, S4, 3/6 systolic murmur; no rub. Abdomen: Soft, non-tender, non-distended with normoactive bowel sounds. No hepatomegaly. No rebound/guarding. No obvious abdominal masses. Extremities: No clubbing, cyanosis,  edema. Distal pedal pulses are 2+ bilaterally. Right groin bruising.  Neuro: Alert and oriented X 3. Moves all extremities spontaneously. Psych: Normal affect.  Labs    Chemistry Recent Labs Lab 12/18/16 0230 12/19/16 0200 12/20/16 0238  NA 138 136 137  K 4.6 4.1 4.1  CL 109 105 105  CO2 23 24 24   GLUCOSE 176* 173* 169*  BUN 36* 45* 50*  CREATININE 1.76* 1.88* 1.90*  CALCIUM 9.1 8.9 9.2  PROT 5.3*  --   --   ALBUMIN 2.9*  --   --   AST 19  --   --   ALT 12*  --   --   ALKPHOS 48  --   --   BILITOT 0.8  --   --   GFRNONAA 27* 24* 24*  GFRAA 31* 28* 28*  ANIONGAP 6 7 8      Hematology Recent Labs Lab 12/18/16 0230  12/19/16 0200 12/19/16 2026 12/20/16 0238  WBC 7.2  --  6.5  --  7.4  RBC 2.56*  --  2.61*  --  3.03*  HGB 7.9*  < >  8.3* 9.9* 8.9*  HCT 24.5*  < > 24.4* 30.1* 27.6*  MCV 95.7  --  93.5  --  91.1  MCH 30.9  --  31.8  --  29.4  MCHC 32.2  --  34.0  --  32.2  RDW 14.7  --  15.7*  --  16.4*  PLT 121*  --  106*  --  117*  < > = values in this interval not displayed.  Cardiac EnzymesNo results for input(s): TROPONINI in the last 168 hours. No results for input(s): TROPIPOC in the last 168 hours.   BNPNo results for input(s): BNP, PROBNP in the last 168 hours.   DDimer No results for input(s): DDIMER in the last 168 hours.    Radiology    Dg Orthopantogram  Result Date: 12/19/2016 CLINICAL DATA:  Severe aortic stenosis.  Poor dentition EXAM: ORTHOPANTOGRAM/PANORAMIC COMPARISON:  None. FINDINGS: Atrophic changes in the body of the mandible bilaterally. No fracture or bone lesion. Bone loss around right lower pre molar. Lucency in the left upper premolar compatible with caries. Multiple fillings. IMPRESSION: Mandibular bone loss and caries. Electronically Signed   By: Franchot Gallo M.D.   On: 12/19/2016 09:03    Cardiac Studies   Cath 12/17/16 Procedures   Left Heart Cath and Coronary Angiography  Conclusion     Prox Cx to Mid Cx lesion, 99  %stenosed.  Ost 2nd Diag to 2nd Diag lesion, 70 %stenosed.  Mid LAD to Dist LAD lesion, 90 %stenosed.  Ost RCA to Prox RCA lesion, 40 %stenosed.  Mid RCA lesion, 80 %stenosed.  There is severe aortic valve stenosis.  1. Severe triple vessel CAD 2. Severe stenosis mid LAD involving a moderate caliber diagonal branch 3. Severe stenosis distal Circumflex artery 4. Severe stenosis mid RCA 5. Severe aortic valve stenosis (mean gradient 32 mmHg, peak to peak gradient 40 mmHg)  Recommendations: Will admit to telemetry today for diuresis. She is grossly volume overloaded on exam. I was unable to obtain access for a right heart catheterization today. I will begin diuresis with IV Lasix and follow renal function closely. I will ask CT surgery to see her today to discuss CABG and AVR but I do not think she is a good candidate for a complex surgical procedure. If she is not felt to be a surgical candidate, will plan PCI of the LAD, Circumflex and RCA later this week and then continue planning for TAVR. If we bring her back later this week for PCI, will plan right heart cath at that time from right antecubital approach which may be easier if we can unload volume.       Patient Profile     81 y.o. female with history of chronic diastolic CHF, HTN, HLD, DM, gout, breast cancer and severe aortic stenosis who is here today for cardiac cath and will be admitted post cath for diuresis.  Assessment & Plan    1. CAD: Multivessel disease noted on cath. Was seen by Dr. Cyndia Bent for CABG options, but felt best that she undergo staged PCI. No reports of chest pain. This will be planned possibly for next week pending her renal function and respiratory status.  -- continue with medical therapy  2. Severe AS: Not a good candidate surgical candidate. Plans for possible TAVR once able to complete work up.  -- As part of her Pre-Op evaluation, she has pending dental surgery for teeth extraction --> planned for  1045 tomorrow morning with Dr. Enrique Sack --> will  likely need at least 2 day recovery.  3. Acute on Chronic diastolic HF: diuresing well with IV lasix. Breathing much improved. UOP 3.6L yesterday. Weight trending down.  -- transition to oral lasix tomorrow -- daily weights -- I&Os   4. Anemia: Hgb 7.9 with improvement to 8.3 after one unit. Given an additional unit yesterday with improvement to 8.9 -- daily CBC. -- We will start iron supplementation  5. Right groin hematoma: Improved today. Bruising noted. No RP bleed.   Would like to allow some time for this to heal prior to planned staged PCI  6. HL: on statin  7. CKD: Cr 1.76>>1.88>>1.9. Holding evening dose of lasix, with plans for oral dosing tomorrow. -- BMET daily   8. Right carotid stenosis: Seen by [facility] yesterday with plans for follow up after other cardiac issues addressed.   Signed, Carrie Bellis, NP  12/20/2016, 10:03 AM     I have seen, examined and evaluated the patient this AM along with Carrie Bellis, NP.  After reviewing all the available data and chart, we discussed the patients laboratory, study & physical findings as well as symptoms in detail. I agree with her findings, examination as well as impression recommendations as per our discussion.    Attending adjustments noted in italics.   She looks great today. By labs would anticipate that she is borderline euvolemic with her slight creatinine and bicarbonate/BUN increase. -> She had IV Lasix today, we'll switch to by mouth tomorrow.  In order to complete her pre-TAVR/pre-PCI workup, she will need to have teeth removed. Dr. Jorja Loa has scheduled her for 1045 tomorrow morning. - I do expect that she will probably need at least 2 days to recover after that procedure. - As she is borderline euvolemic now, she would potentially be a very discharged over the weekend, but this we provided we can arrange staged PCI on Thursday or Friday of next week with Dr.  Angelena Form.    Glenetta Hew, M.D., M.S. Interventional Cardiologist   Pager # [phone] Phone # [phone] [address] [place], [place] [address]

## 2016-12-21 ENCOUNTER — Inpatient Hospital Stay (HOSPITAL_COMMUNITY): Payer: Medicare Other | Admitting: Registered Nurse

## 2016-12-21 ENCOUNTER — Ambulatory Visit: Payer: Medicare Other | Admitting: Cardiovascular Disease

## 2016-12-21 ENCOUNTER — Encounter (HOSPITAL_COMMUNITY): Payer: Self-pay | Admitting: Surgery

## 2016-12-21 ENCOUNTER — Encounter (HOSPITAL_COMMUNITY)
Admission: AD | Disposition: A | Discharge status: Home / Self Care | Payer: Self-pay | Source: Ambulatory Visit | Attending: Cardiovascular Disease

## 2016-12-21 DIAGNOSIS — K0889 Other specified disorders of teeth and supporting structures: Secondary | ICD-10-CM

## 2016-12-21 DIAGNOSIS — Z01818 Encounter for other preprocedural examination: Secondary | ICD-10-CM

## 2016-12-21 DIAGNOSIS — K036 Deposits [accretions] on teeth: Secondary | ICD-10-CM

## 2016-12-21 DIAGNOSIS — K083 Retained dental root: Secondary | ICD-10-CM

## 2016-12-21 DIAGNOSIS — K0530 Chronic periodontitis, unspecified: Secondary | ICD-10-CM

## 2016-12-21 DIAGNOSIS — I350 Nonrheumatic aortic (valve) stenosis: Secondary | ICD-10-CM

## 2016-12-21 DIAGNOSIS — K045 Chronic apical periodontitis: Secondary | ICD-10-CM

## 2016-12-21 HISTORY — PX: MULTIPLE EXTRACTIONS WITH ALVEOLOPLASTY: SHX5342

## 2016-12-21 LAB — CBC
HCT: 27.4 % — ABNORMAL LOW (ref 36.0–46.0)
Hemoglobin: 9 g/dL — ABNORMAL LOW (ref 12.0–15.0)
MCH: 29.9 pg (ref 26.0–34.0)
MCHC: 32.8 g/dL (ref 30.0–36.0)
MCV: 91 fL (ref 78.0–100.0)
PLATELETS: 124 10*3/uL — AB (ref 150–400)
RBC: 3.01 MIL/uL — AB (ref 3.87–5.11)
RDW: 16.2 % — ABNORMAL HIGH (ref 11.5–15.5)
WBC: 6.9 10*3/uL (ref 4.0–10.5)

## 2016-12-21 LAB — BASIC METABOLIC PANEL
Anion gap: 9 (ref 5–15)
BUN: 56 mg/dL — AB (ref 6–20)
CHLORIDE: 106 mmol/L (ref 101–111)
CO2: 23 mmol/L (ref 22–32)
CREATININE: 1.83 mg/dL — AB (ref 0.44–1.00)
Calcium: 9.3 mg/dL (ref 8.9–10.3)
GFR calc non Af Amer: 25 mL/min — ABNORMAL LOW (ref >60)
GFR, EST AFRICAN AMERICAN: 29 mL/min — AB (ref >60)
GLUCOSE: 159 mg/dL — AB (ref 65–99)
Potassium: 4.7 mmol/L (ref 3.5–5.1)
SODIUM: 138 mmol/L (ref 135–145)

## 2016-12-21 LAB — GLUCOSE, CAPILLARY
GLUCOSE-CAPILLARY: 171 mg/dL — AB (ref 65–99)
GLUCOSE-CAPILLARY: 229 mg/dL — AB (ref 65–99)
Glucose-Capillary: 187 mg/dL — ABNORMAL HIGH (ref 65–99)
Glucose-Capillary: 201 mg/dL — ABNORMAL HIGH (ref 65–99)

## 2016-12-21 SURGERY — MULTIPLE EXTRACTION WITH ALVEOLOPLASTY
Anesthesia: General

## 2016-12-21 MED ORDER — FENTANYL CITRATE (PF) 100 MCG/2ML IJ SOLN: 25.0000 ug | INTRAMUSCULAR | Status: DC | PRN

## 2016-12-21 MED ORDER — LIDOCAINE 2% (20 MG/ML) 5 ML SYRINGE
INTRAMUSCULAR | Status: AC
  Filled 2016-12-21: qty 5

## 2016-12-21 MED ORDER — SUCCINYLCHOLINE CHLORIDE 200 MG/10ML IV SOSY
PREFILLED_SYRINGE | INTRAVENOUS | Status: AC
  Filled 2016-12-21: qty 10

## 2016-12-21 MED ORDER — HEMOSTATIC AGENTS (NO CHARGE) OPTIME
TOPICAL | Status: DC | PRN
  Administered 2016-12-21: 1 via TOPICAL

## 2016-12-21 MED ORDER — OXYMETAZOLINE HCL 0.05 % NA SOLN
NASAL | Status: DC | PRN
  Administered 2016-12-21: 1

## 2016-12-21 MED ORDER — ESMOLOL HCL 100 MG/10ML IV SOLN
INTRAVENOUS | Status: DC | PRN
  Administered 2016-12-21 (×4): 10 mg via INTRAVENOUS

## 2016-12-21 MED ORDER — ONDANSETRON HCL 4 MG/2ML IJ SOLN
INTRAMUSCULAR | Status: AC
  Filled 2016-12-21: qty 2

## 2016-12-21 MED ORDER — FENTANYL CITRATE (PF) 100 MCG/2ML IJ SOLN
INTRAMUSCULAR | Status: DC | PRN
Start: 2016-12-21 — End: 2016-12-21
  Administered 2016-12-21: 50 ug via INTRAVENOUS
  Administered 2016-12-21: 100 ug via INTRAVENOUS

## 2016-12-21 MED ORDER — PROPOFOL 10 MG/ML IV BOLUS
INTRAVENOUS | Status: AC
  Filled 2016-12-21: qty 20

## 2016-12-21 MED ORDER — LACTATED RINGERS IV SOLN
INTRAVENOUS | Status: DC
  Administered 2016-12-21 (×3): via INTRAVENOUS

## 2016-12-21 MED ORDER — OXYMETAZOLINE HCL 0.05 % NA SOLN
NASAL | Status: AC
  Filled 2016-12-21: qty 15

## 2016-12-21 MED ORDER — ENSURE ENLIVE PO LIQD
237.0000 mL | Freq: Three times a day (TID) | ORAL | Status: DC
  Administered 2016-12-21 – 2016-12-22 (×2): 237 mL via ORAL

## 2016-12-21 MED ORDER — HYDROCODONE-ACETAMINOPHEN 5-325 MG PO TABS
1.0000 | ORAL_TABLET | Freq: Four times a day (QID) | ORAL | Status: DC | PRN
  Administered 2016-12-21: 1 via ORAL
  Administered 2016-12-22: 2 via ORAL
  Filled 2016-12-21: qty 1
  Filled 2016-12-21: qty 2

## 2016-12-21 MED ORDER — MIDAZOLAM HCL 2 MG/2ML IJ SOLN
INTRAMUSCULAR | Status: AC
  Filled 2016-12-21: qty 2

## 2016-12-21 MED ORDER — SUGAMMADEX SODIUM 500 MG/5ML IV SOLN
INTRAVENOUS | Status: DC | PRN
  Administered 2016-12-21: 250 mg via INTRAVENOUS

## 2016-12-21 MED ORDER — BUPIVACAINE-EPINEPHRINE (PF) 0.5% -1:200000 IJ SOLN
INTRAMUSCULAR | Status: AC
  Filled 2016-12-21: qty 3.6

## 2016-12-21 MED ORDER — LIDOCAINE-EPINEPHRINE 2 %-1:100000 IJ SOLN
INTRAMUSCULAR | Status: DC | PRN
  Administered 2016-12-21 (×4): 1.7 mL via INTRADERMAL

## 2016-12-21 MED ORDER — AMINOCAPROIC ACID SOLUTION 5% (50 MG/ML)
10.0000 mL | ORAL | Status: AC
  Administered 2016-12-21 – 2016-12-22 (×9): 10 mL via ORAL

## 2016-12-21 MED ORDER — ROCURONIUM BROMIDE 100 MG/10ML IV SOLN
INTRAVENOUS | Status: DC | PRN
  Administered 2016-12-21: 5 mg via INTRAVENOUS
  Administered 2016-12-21: 30 mg via INTRAVENOUS
  Administered 2016-12-21: 5 mg via INTRAVENOUS

## 2016-12-21 MED ORDER — SUCCINYLCHOLINE CHLORIDE 20 MG/ML IJ SOLN
INTRAMUSCULAR | Status: DC | PRN
  Administered 2016-12-21: 160 mg via INTRAVENOUS

## 2016-12-21 MED ORDER — LIDOCAINE HCL (CARDIAC) 20 MG/ML IV SOLN
INTRAVENOUS | Status: DC | PRN
  Administered 2016-12-21: 60 mg via INTRAVENOUS

## 2016-12-21 MED ORDER — 0.9 % SODIUM CHLORIDE (POUR BTL) OPTIME
TOPICAL | Status: DC | PRN
  Administered 2016-12-21: 1000 mL

## 2016-12-21 MED ORDER — EPHEDRINE SULFATE 50 MG/ML IJ SOLN
INTRAMUSCULAR | Status: DC | PRN
  Administered 2016-12-21 (×2): 2.5 mg via INTRAVENOUS
  Administered 2016-12-21: 5 mg via INTRAVENOUS
  Administered 2016-12-21 (×2): 2.5 mg via INTRAVENOUS

## 2016-12-21 MED ORDER — SUGAMMADEX SODIUM 500 MG/5ML IV SOLN
INTRAVENOUS | Status: AC
  Filled 2016-12-21: qty 5

## 2016-12-21 MED ORDER — BUPIVACAINE-EPINEPHRINE 0.5% -1:200000 IJ SOLN
INTRAMUSCULAR | Status: DC | PRN
  Administered 2016-12-21 (×2): 1.8 mL

## 2016-12-21 MED ORDER — FENTANYL CITRATE (PF) 250 MCG/5ML IJ SOLN
INTRAMUSCULAR | Status: AC
  Filled 2016-12-21: qty 5

## 2016-12-21 MED ORDER — ETOMIDATE 2 MG/ML IV SOLN
INTRAVENOUS | Status: DC | PRN
  Administered 2016-12-21: 12 mg via INTRAVENOUS

## 2016-12-21 MED ORDER — LIDOCAINE-EPINEPHRINE 2 %-1:100000 IJ SOLN
INTRAMUSCULAR | Status: AC
Start: 2016-12-21 — End: 2016-12-21
  Filled 2016-12-21: qty 10.2

## 2016-12-21 MED ORDER — MIDAZOLAM HCL 2 MG/2ML IJ SOLN
INTRAMUSCULAR | Status: DC | PRN
  Administered 2016-12-21: 1 mg via INTRAVENOUS

## 2016-12-21 MED ORDER — AMINOCAPROIC ACID SOLUTION 5% (50 MG/ML)
10.0000 mL | ORAL | Status: DC
  Filled 2016-12-21: qty 100

## 2016-12-21 MED ORDER — PROPOFOL 10 MG/ML IV BOLUS
INTRAVENOUS | Status: DC | PRN
  Administered 2016-12-21: 20 mg via INTRAVENOUS

## 2016-12-21 MED ORDER — AMINOCAPROIC ACID SOLUTION 5% (50 MG/ML)
10.0000 mL | ORAL | Status: DC
  Administered 2016-12-21: 10 mL via ORAL
  Filled 2016-12-21: qty 100

## 2016-12-21 MED ORDER — ROCURONIUM BROMIDE 10 MG/ML (PF) SYRINGE
PREFILLED_SYRINGE | INTRAVENOUS | Status: AC
  Filled 2016-12-21: qty 5

## 2016-12-21 MED ORDER — GLYCOPYRROLATE 0.2 MG/ML IJ SOLN
INTRAMUSCULAR | Status: DC | PRN
  Administered 2016-12-21: .1 mg via INTRAVENOUS
  Administered 2016-12-21: 0.1 mg via INTRAVENOUS

## 2016-12-21 SURGICAL SUPPLY — 37 items
ALCOHOL 70% 16 OZ (MISCELLANEOUS) ×2 IMPLANT
ATTRACTOMAT 16X20 MAGNETIC DRP (DRAPES) ×2 IMPLANT
BLADE SURG 15 STRL LF DISP TIS (BLADE) ×2 IMPLANT
BLADE SURG 15 STRL SS (BLADE) ×4
COVER SURGICAL LIGHT HANDLE (MISCELLANEOUS) ×2 IMPLANT
GAUZE PACKING FOLDED 2  STR (GAUZE/BANDAGES/DRESSINGS) ×1
GAUZE PACKING FOLDED 2 STR (GAUZE/BANDAGES/DRESSINGS) ×1 IMPLANT
GAUZE SPONGE 4X4 12PLY STRL (GAUZE/BANDAGES/DRESSINGS) ×1 IMPLANT
GAUZE SPONGE 4X4 16PLY XRAY LF (GAUZE/BANDAGES/DRESSINGS) ×2 IMPLANT
GLOVE BIOGEL PI IND STRL 6 (GLOVE) ×1 IMPLANT
GLOVE BIOGEL PI INDICATOR 6 (GLOVE) ×1
GLOVE SURG ORTHO 8.0 STRL STRW (GLOVE) ×2 IMPLANT
GLOVE SURG SS PI 6.0 STRL IVOR (GLOVE) ×2 IMPLANT
GOWN STRL REUS W/ TWL LRG LVL3 (GOWN DISPOSABLE) ×1 IMPLANT
GOWN STRL REUS W/TWL 2XL LVL3 (GOWN DISPOSABLE) ×2 IMPLANT
GOWN STRL REUS W/TWL LRG LVL3 (GOWN DISPOSABLE) ×2
HEMOSTAT SURGICEL 2X14 (HEMOSTASIS) ×2 IMPLANT
KIT BASIN OR (CUSTOM PROCEDURE TRAY) ×2 IMPLANT
KIT ROOM TURNOVER OR (KITS) ×2 IMPLANT
MANIFOLD NEPTUNE WASTE (CANNULA) ×2 IMPLANT
NDL BLUNT 16X1.5 OR ONLY (NEEDLE) ×1 IMPLANT
NEEDLE BLUNT 16X1.5 OR ONLY (NEEDLE) ×2 IMPLANT
NS IRRIG 1000ML POUR BTL (IV SOLUTION) ×2 IMPLANT
PACK EENT II TURBAN DRAPE (CUSTOM PROCEDURE TRAY) ×2 IMPLANT
PAD ARMBOARD 7.5X6 YLW CONV (MISCELLANEOUS) ×2 IMPLANT
SPONGE SURGIFOAM ABS GEL 100 (HEMOSTASIS) ×1 IMPLANT
SPONGE SURGIFOAM ABS GEL 12-7 (HEMOSTASIS) IMPLANT
SPONGE SURGIFOAM ABS GEL SZ50 (HEMOSTASIS) IMPLANT
SUCTION FRAZIER HANDLE 10FR (MISCELLANEOUS) ×1
SUCTION TUBE FRAZIER 10FR DISP (MISCELLANEOUS) ×1 IMPLANT
SUT CHROMIC 3 0 PS 2 (SUTURE) ×4 IMPLANT
SUT CHROMIC 4 0 P 3 18 (SUTURE) IMPLANT
SYR 50ML SLIP (SYRINGE) ×2 IMPLANT
TOWEL OR 17X26 10 PK STRL BLUE (TOWEL DISPOSABLE) ×2 IMPLANT
TUBE CONNECTING 12X1/4 (SUCTIONS) ×2 IMPLANT
WATER TABLETS ICX (MISCELLANEOUS) ×2 IMPLANT
YANKAUER SUCT BULB TIP NO VENT (SUCTIONS) ×2 IMPLANT

## 2016-12-21 NOTE — Care Management Note (Addendum)
Case Management Note  Patient Details  Name: MARCELLENE SHIVLEY MRN: [id number] Date of Birth: 05/05/1936  Subjective/Objective: Pt presented for worsening Dyspnea. Post Cardiac cath revealed triple vessel CAD with Severe Aortic Stenosis. Pre TAVR/ PCI workup in process. Dental is following the patient. Pt is from home with husband. CM did provide patient with information in regards to personal care services for husband.                    Action/Plan: CM will continue to monitor for additional needs.   Expected Discharge Date:                  Expected Discharge Plan:  [place]  In-House Referral:  NA  Discharge planning Services  CM Consult  Post Acute Care Choice:    Choice offered to:     DME Arranged:    DME Agency:     HH Arranged:    HH Agency:     Status of Service:  In process, will continue to follow  If discussed at Long Length of Stay Meetings, dates discussed:    Additional Comments:  Bethena Roys, RN 12/21/2016, 3:43 PM

## 2016-12-21 NOTE — Anesthesia Preprocedure Evaluation (Addendum)
Anesthesia Evaluation  Patient identified by MRN, date of birth, ID band Patient awake    Reviewed: Allergy & Precautions, NPO status , Patient's Chart, lab work & pertinent test results  Airway Mallampati: II  TM Distance: >3 FB     Dental   Pulmonary shortness of breath,    breath sounds clear to auscultation       Cardiovascular hypertension, + angina + CAD, + Peripheral Vascular Disease and +CHF  + Valvular Problems/Murmurs AS  Rhythm:Regular Rate:Normal + Systolic murmurs    Neuro/Psych    GI/Hepatic negative GI ROS, Neg liver ROS,   Endo/Other  diabetes  Renal/GU negative Renal ROS     Musculoskeletal  (+) Arthritis ,   Abdominal   Peds  Hematology  (+) anemia ,   Anesthesia Other Findings   Reproductive/Obstetrics                            Anesthesia Physical Anesthesia Plan  ASA: IV  Anesthesia Plan: General   Post-op Pain Management:    Induction: Intravenous  PONV Risk Score and Plan: 3 and Ondansetron and Treatment may vary due to age or medical condition  Airway Management Planned: Oral ETT  Additional Equipment:   Intra-op Plan:   Post-operative Plan: Possible Post-op intubation/ventilation  Informed Consent: I have reviewed the patients History and Physical, chart, labs and discussed the procedure including the risks, benefits and alternatives for the proposed anesthesia with the patient or authorized representative who has indicated his/her understanding and acceptance.   Dental advisory given  Plan Discussed with: CRNA and Anesthesiologist  Anesthesia Plan Comments:        Anesthesia Quick Evaluation

## 2016-12-21 NOTE — Progress Notes (Signed)
Nutrition Brief Note  Received consult S/P multiple tooth extractions earlier today. Currently on clear liquids with plans for diet advancement as tolerated. Patient willing to drink Ensure supplements to maximize intake until oral intake is adequate.  Body mass index is 44.46 kg/m. Patient meets criteria for class 3, extreme/morbid obesity based on current BMI.   Labs and medications reviewed.   No further nutrition interventions warranted at this time. If nutrition issues arise, please consult RD.   Molli Barrows, RD, LDN, [place] Pager [phone] After Hours Pager [phone]

## 2016-12-21 NOTE — Anesthesia Procedure Notes (Signed)
Procedure Name: Intubation Date/Time: 12/21/2016 11:46 AM Performed by: Shirlyn Goltz Pre-anesthesia Checklist: Patient identified, Emergency Drugs available, Suction available and Patient being monitored Patient Re-evaluated:Patient Re-evaluated prior to induction Oxygen Delivery Method: Circle system utilized Preoxygenation: Pre-oxygenation with 100% oxygen Induction Type: IV induction Ventilation: Mask ventilation without difficulty Laryngoscope Size: Glidescope and 3 Grade View: Grade I Tube type: Oral Tube size: 7.0 mm Number of attempts: 1 Airway Equipment and Method: Stylet Placement Confirmation: ETT inserted through vocal cords under direct vision,  positive ETCO2 and breath sounds checked- equal and bilateral Secured at: 24 cm Tube secured with: Tape Dental Injury: Teeth and Oropharynx as per pre-operative assessment

## 2016-12-21 NOTE — Progress Notes (Signed)
Pt with mouthwash scheduled every hour for 10 doses. Pt received one dose in PACU however medication not available once patient arrived back to floor. Medication discontinued when transfer orders released but re-entered as previously prescribed. Multiple attempts have been made to contact pharmacy to get medication to floor. Per pharmacy, medication is being made and should arrive soon. Doses and times adjusted so patient would receive all 10 doses per order.

## 2016-12-21 NOTE — Anesthesia Procedure Notes (Signed)
Arterial Line Insertion Start/End7/27/2018 10:34 AM, 12/21/2016 10:34 AM Performed by: Josephine Igo, CRNA  Patient location: Pre-op. Preanesthetic checklist: patient identified, IV checked, risks and benefits discussed, surgical consent, pre-op evaluation and timeout performed Lidocaine 1% used for infiltration Right, radial was placed Catheter size: 20 G Hand hygiene performed  and maximum sterile barriers used   Attempts: 1 Procedure performed without using ultrasound guided technique. Following insertion, dressing applied and Biopatch. Post procedure assessment: normal  Patient tolerated the procedure well with no immediate complications.

## 2016-12-21 NOTE — Op Note (Signed)
OPERATIVE REPORT  Patient:            Carrie Monroe Date of Birth:  [birth year] MRN:                [id number]   DATE OF PROCEDURE:  12/21/2016  PREOPERATIVE DIAGNOSES: 1. Severe aortic stenosis 2. Pre-heart valve surgery dental protocol 3. Chronic apical periodontitis 4. Retained root segment 5. Chronic periodontitis 6. Accretions 7. Loose teeth  POSTOPERATIVE DIAGNOSES: 1. Severe aortic stenosis 2. Pre-heart valve surgery dental protocol 3. Chronic apical periodontitis 4. Retained root segment 5. Chronic periodontitis 6. Accretions 7. Loose teeth  OPERATIONS: 1. Multiple extraction of tooth numbers 12, 23, 24, 25, 26, and 29 2. 3 Quadrants of alveoloplasty 3. Gross debridement of remaining dentition   SURGEON: Lenn Cal, DDS  ASSISTANT: Camie Patience, (dental assistant)  ANESTHESIA: General anesthesia via oral endotracheal tube.  MEDICATIONS: 1. Ancef 2 g IV prior to invasive dental procedures. 2. Local anesthesia with a total utilization of 2 carpules each containing 34 mg of lidocaine with 0.017 mg of epinephrine as well as 2 carpules each containing 9 mg of bupivacaine with 0.009 mg of epinephrine.  SPECIMENS: There are 6 teeth that were discarded.  DRAINS: None  CULTURES: None  COMPLICATIONS: None   ESTIMATED BLOOD LOSS: less than 50 mls.  INTRAVENOUS FLUIDS: 900 mLs of Lactated ringers solution.  INDICATIONS: The patient was recently diagnosed with severe aortic stenosis.  A likely necessary dental consultation was then requested to evaluate poor dentition.  The patient was examined and treatment planned for level extractions with alveoloplasty and grocery remaining dentition in the operating room with general anesthesia.  This treatment plan was formulated to decrease the risks and complications associated with dental infection from affecting the patient's systemic health and anticipated heart valve surgery  OPERATIVE FINDINGS: Patient  was examined operating room number 10.  The teeth were identified for extraction. The patient was noted be affected by chronic periodontitis,  accretions, loose teeth, retained root segment, and chronic apical periodontitis.   DESCRIPTION OF PROCEDURE: Patient was brought to the main operating room number 10. Patient was then placed in the supine position on the operating table.  general anesthesia was then induced per the anesthesia team. The patient was then prepped and draped in the usual manner for dental medicine procedure. A timeout was performed. The patient was identified and procedures were verified. A throat pack was placed at this time. The oral cavity was then thoroughly examined with the findings noted above. The patient was then ready for dental medicine procedure as follows:  Local anesthesia was then administered sequentially with a total utilization of 2 carpules each containing 34 mg of lidocaine with 0.017 mg of epinephrine as well as 2 carpules  each containing 9 mg bupivacaine with 0.009 mg of epinephrine.  The Maxillary left quadrant was first approached. Anesthesia was then delivered utilizing infiltration with lidocaine with epinephrine. A #15 blade incision was then made from the distal of #13 and extended to the mesial of #11 .  A  surgical flap was then carefully reflected.  Tooth #12 was then subluxated with a series straight elevators and removed with a 150 forceps without complications. Alveoloplasty was then performed utilizing a ronguers and bone file to help achieve primary closure.  The surgical site was then irrigated with copious amounts of sterile saline. The tissues were approximated and trimmed appropriately.  A piece of Surgifoam was placed at extraction sockets appropriately.The surgical site  was then closed from the distal of #13 and extended to the distal of #11 utilizing 3-0 chromic gut suture in a continuous interrupted suture technique 1.  At this point time,  the mandibular quadrants were approached. The patient was given bilateral inferior alveolar nerve blocks and long buccal nerve blocks utilizing the bupivacaine with epinephrine. Further infiltration was then achieved utilizing the lidocaine with epinephrine. A 15 blade incision was then made from the  mesial of #22 and extended to the mesial #27. A surgical flap was then carefully reflected.  Tooth numbers 22 through 24 and tooth #29 were then removed with a 151 forceps without complications. The sockets were curetted and compressed appropriately.  Alveoloplasty was then performed utilizing a rongeurs and bone file. The tissues were approximated and trimmed appropriately. The surgical sites were then irrigated with copious amounts of sterile saline. The mandibular anterior surgical site was then closed from the mesial #22 and extended to the mesial of #27 utilizing 3-0 chromic gut suture in a continuous interrupted suture technique 1. Extraction site area #29 was then closed utilizing a figure-of-eight suture technique and 3-0 chromic gut material.   At this point time a gross debridement procedure was performed utilizing the KaVo sonic scaler. A series of hand curettes were then used to further remove accretions. A sonic scaler was then again used to further refine removal of accretions. This completed the gross debridement procedure.    At this point time, the entire mouth was irrigated with copious amounts of sterile saline. The patient was examined for complications, seeing none, the dental medicine procedure was deemed to be complete. The throat pack was removed at this time. A series of 4 x 4 gauze moistened with Amicar 5% rinse  were placed in the mouth to aid hemostasis. The patient was then handed over to the anesthesia team for final disposition. After an appropriate amount of time, the patient was extubated and taken to the postanesthsia care unit in good condition. All counts were correct for the  dental medicine procedure. The patient will continue the Amicar 5% rinse post operatively. Patient is to rinse with 10 ML's every hour for the next 10 hours in a swish and spit manner. The patient will be followed postoperatively by the Cardiology team and will be discharged at the discretion of the cardiology team as indicated.    Lenn Cal, DDS.

## 2016-12-21 NOTE — Progress Notes (Signed)
PRE-OPERATIVE NOTE:  12/21/2016 Carrie Monroe [id number]  VITALS: BP (!) 140/52   Pulse (!) 57   Temp 97.8 F (36.6 C) (Oral)   Resp 17   Ht 5\' 3"  (1.6 m)   Wt 251 lb 6.4 oz (114 kg)   SpO2 94%   BMI 44.53 kg/m   Lab Results  Component Value Date   WBC 6.9 12/21/2016   HGB 9.0 (L) 12/21/2016   HCT 27.4 (L) 12/21/2016   MCV 91.0 12/21/2016   PLT 124 (L) 12/21/2016   BMET    Component Value Date/Time   NA 138 12/21/2016 0328   NA 141 12/13/2016 1600   NA 141 02/23/2015 1029   K 4.7 12/21/2016 0328   K 4.3 02/23/2015 1029   CL 106 12/21/2016 0328   CL 111 (H) 08/28/2012 1224   CO2 23 12/21/2016 0328   CO2 26 02/23/2015 1029   GLUCOSE 159 (H) 12/21/2016 0328   GLUCOSE 193 (H) 02/23/2015 1029   GLUCOSE 103 (H) 08/28/2012 1224   BUN 56 (H) 12/21/2016 0328   BUN 34 (H) 12/13/2016 1600   BUN 20.2 02/23/2015 1029   CREATININE 1.83 (H) 12/21/2016 0328   CREATININE 1.3 (H) 02/23/2015 1029   CALCIUM 9.3 12/21/2016 0328   CALCIUM 9.5 02/23/2015 1029   GFRNONAA 25 (L) 12/21/2016 0328   GFRAA 29 (L) 12/21/2016 0328    Lab Results  Component Value Date   INR 1.0 12/06/2016   INR 1.04 07/28/2013   No results found for: PTT   Carrie Monroe presents for multiple dental extractions with alveoloplasty and gross debridement of remaining teeth in the operating room.   SUBJECTIVE: The patient denies any acute medical or dental changes and agrees to proceed with treatment as planned.  EXAM: No sign of acute dental changes.  ASSESSMENT: Patient is affected by retained root segment, history of acute pulpitis, chronic apical periodontitis, chronic periodontitis, loose teeth, and accretions.  PLAN: Patient agrees to proceed with treatment as planned in the operating room as previously discussed and accepts the risks, benefits, and complications of the proposed treatment. Patient is aware of the risk for bleeding, bruising, swelling, infection, pain, nerve damage,  soft tissue damage, damage to adjacent teeth, sinus involvement, root tip fracture, mandible fracture, and the risks of complications associated with the anesthesia. Patient also is aware of the potential for other complications up to and including death due to overall cardiovascular and medical compromise.   Lenn Cal, DDS

## 2016-12-21 NOTE — Transfer of Care (Signed)
Immediate Anesthesia Transfer of Care Note  Patient: Carrie Monroe  Procedure(s) Performed: Procedure(s): Extraction of tooth #'s 12, [id number] and 29 with alveoloplasty and gross debridement of remaining teeth. (N/A)  Patient Location: PACU  Anesthesia Type:General  Level of Consciousness: awake, alert , oriented and patient cooperative  Airway & Oxygen Therapy: Patient Spontanous Breathing and Patient connected to face mask oxygen  Post-op Assessment: Report given to RN and Post -op Vital signs reviewed and stable  Post vital signs: Reviewed and stable  Last Vitals:  Vitals:   12/21/16 1245 12/21/16 1251  BP: (!) 173/54 (!) 172/52  Pulse: 87 87  Resp: 13 15  Temp:      Last Pain:  Vitals:   12/21/16 0740  TempSrc:   PainSc: 0-No pain      Patients Stated Pain Goal: 0 (62/[id number])  Complications: No apparent anesthesia complications

## 2016-12-21 NOTE — Care Management Important Message (Signed)
Important Message  Patient Details  Name: Carrie Monroe MRN: [id number] Date of Birth: 03-30-1936   Medicare Important Message Given:  Yes    Cherye Gaertner [address] 12/21/2016, 2:43 PM

## 2016-12-21 NOTE — Progress Notes (Signed)
Progress Note  Patient Name: Carrie Monroe Date of Encounter: 12/21/2016  Primary Cardiologist: Croitoru   Subjective   I saw her today after her dental extraction.  Currently resting in bed with her ice packs tied in place. Joking and laughing with her family No chest pain or pressure. No dyspnea. No arrhythmias.  Inpatient Medications    Scheduled Meds: . aminocaproic acid  10 mL Oral Q1H  . aspirin  81 mg Oral Daily  . feeding supplement (ENSURE ENLIVE)  237 mL Oral TID BM  . furosemide  40 mg Oral BID  . hydrALAZINE  50 mg Oral Q8H  . insulin aspart  0-15 Units Subcutaneous TID WC  . metoprolol tartrate  25 mg Oral BID  . rosuvastatin  10 mg Oral Daily  . tamoxifen  20 mg Oral q morning - 10a   Continuous Infusions: . sodium chloride     PRN Meds: sodium chloride, acetaminophen, HYDROcodone-acetaminophen, ondansetron (ZOFRAN) IV   Vital Signs    Vitals:   12/21/16 1322 12/21/16 1330 12/21/16 1333 12/21/16 1514  BP:  (!) 148/50 (!) 148/50 (!) 123/46  Pulse: 80 79 78 74  Resp: 15 17 17 18   Temp:   (!) 97.4 F (36.3 C) 98.2 F (36.8 C)  TempSrc:    Axillary  SpO2: 98% 99% 99% 99%  Weight:      Height:        Intake/Output Summary (Last 24 hours) at 12/21/16 1740 Last data filed at 12/21/16 1254  Gross per 24 hour  Intake             1243 ml  Output             1625 ml  Net             -382 ml   Filed Weights   12/20/16 0541 12/21/16 0542 12/21/16 0950  Weight: 253 lb 14.4 oz (115.2 kg) 251 lb 6.4 oz (114 kg) 251 lb (113.9 kg)    Telemetry   Maintaining sinus rhythm - Personally Reviewed  ECG    N/a - Personally Reviewed  Physical Exam   Physical Exam  Constitutional: She is oriented to person, place, and time. She appears well-developed and well-nourished. No distress.  HENT:  Head: Normocephalic.  Has ice packs on her jaws following dental extraction  Eyes: Pupils are equal, round, and reactive to light. EOM are normal.    Cardiovascular: Normal rate and regular rhythm.   Occasional extrasystoles are present.  Murmur heard.  Harsh crescendo-decrescendo mid to late systolic murmur is present with a grade of 4/6  at the upper right sternal border, upper left sternal border radiating to the neck Pulmonary/Chest: Effort normal and breath sounds normal. No respiratory distress.  Abdominal: Soft. Bowel sounds are normal. She exhibits no distension. There is no tenderness.  Musculoskeletal: Normal range of motion. She exhibits no edema.  Right groin cath site has extensive ecchymosis, but not tender, not firm.  Neurological: She is alert and oriented to person, place, and time.  Skin: Skin is warm and dry. No erythema.  Psychiatric: She has a normal mood and affect. Her behavior is normal. Judgment and thought content normal.    Labs    Chemistry  Recent Labs Lab 12/18/16 0230 12/19/16 0200 12/20/16 0238 12/21/16 0328  NA 138 136 137 138  K 4.6 4.1 4.1 4.7  CL 109 105 105 106  CO2 23 24 24 23   GLUCOSE 176* 173* 169*  159*  BUN 36* 45* 50* 56*  CREATININE 1.76* 1.88* 1.90* 1.83*  CALCIUM 9.1 8.9 9.2 9.3  PROT 5.3*  --   --   --   ALBUMIN 2.9*  --   --   --   AST 19  --   --   --   ALT 12*  --   --   --   ALKPHOS 48  --   --   --   BILITOT 0.8  --   --   --   GFRNONAA 27* 24* 24* 25*  GFRAA 31* 28* 28* 29*  ANIONGAP 6 7 8 9      Hematology  Recent Labs Lab 12/19/16 0200 12/19/16 2026 12/20/16 0238 12/21/16 0328  WBC 6.5  --  7.4 6.9  RBC 2.61*  --  3.03* 3.01*  HGB 8.3* 9.9* 8.9* 9.0*  HCT 24.4* 30.1* 27.6* 27.4*  MCV 93.5  --  91.1 91.0  MCH 31.8  --  29.4 29.9  MCHC 34.0  --  32.2 32.8  RDW 15.7*  --  16.4* 16.2*  PLT 106*  --  117* 124*    Cardiac EnzymesNo results for input(s): TROPONINI in the last 168 hours. No results for input(s): TROPIPOC in the last 168 hours.   BNPNo results for input(s): BNP, PROBNP in the last 168 hours.   DDimer No results for input(s): DDIMER in the  last 168 hours.    Radiology    No results found.  Cardiac Studies   Cath 12/17/16 Procedures   Left Heart Cath and Coronary Angiography  Conclusion     Prox Cx to Mid Cx lesion, 99 %stenosed.  Ost 2nd Diag to 2nd Diag lesion, 70 %stenosed.  Mid LAD to Dist LAD lesion, 90 %stenosed.  Ost RCA to Prox RCA lesion, 40 %stenosed.  Mid RCA lesion, 80 %stenosed.  There is severe aortic valve stenosis.  1. Severe triple vessel CAD 2. Severe stenosis mid LAD involving a moderate caliber diagonal branch 3. Severe stenosis distal Circumflex artery 4. Severe stenosis mid RCA 5. Severe aortic valve stenosis (mean gradient 32 mmHg, peak to peak gradient 40 mmHg)  Recommendations: Will admit to telemetry today for diuresis. She is grossly volume overloaded on exam. I was unable to obtain access for a right heart catheterization today. I will begin diuresis with IV Lasix and follow renal function closely. I will ask CT surgery to see her today to discuss CABG and AVR but I do not think she is a good candidate for a complex surgical procedure. If she is not felt to be a surgical candidate, will plan PCI of the LAD, Circumflex and RCA later this week and then continue planning for TAVR. If we bring her back later this week for PCI, will plan right heart cath at that time from right antecubital approach which may be easier if we can unload volume.       Patient Profile     81 y.o. female with history of chronic diastolic CHF, HTN, HLD, DM, gout, breast cancer and severe aortic stenosis who is here today for cardiac cath and will be admitted post cath for diuresis.  Assessment & Plan    1. CAD: Multivessel disease noted on cath. - Not felt to be a good CABG/AVR candidate. Plan is for staged PCI  discussed with Dr. Angelena Form, he will be back in the Cath Lab Thursday or Friday of next week.. We have tentative schedule her for Friday of this coming  week. - She will need outpatient cath  lab orders written when she is discharged  She is not very active and has not had active angina symptoms.  She is on beta blocker, aspirin and statin. She is on ranolazine for afterload reduction given her  Renal insufficiency.  2. Severe AS: Not a good candidate surgical candidate. Plans for possible TAVR once able to complete work up.  -- Had multiple teeth extraction today by Dr. Enrique Sack  Plan is to continue with outpatient pre-TAVR evaluation including staged PCI  3. Acute on Chronic diastolic HF: diuresing well with IV lasix. Breathing much improved. UOP 3.6L yesterday. Weight trending down.   Basically euvolemic on exam. No edema. No orthopnea.   We transitioned to oral Lasix today, and her renal function is stabilized.  Would discharge her on 40 mg twice a day standing dose of Lasix = >  will defer to Dr. Angelena Form if he would like to do a -- daily weights -- I&Os   4. Anemia: Hgb 7.9 with improvement to 8.3 after one unit. Given an additional unit yesterday with improvement to 8.9 -- daily CBC - stable hemoglobin -- we will need to start iron supplementation On discharge   5. Right groin hematoma: Improved today. Bruising noted. No RP bleed.   Large area of ecchymosis   6. HL: on statin  7. CKD: Cr 1.76>>1.88>>1.9. Holding evening dose of lasix, with plans for oral dosing tomorrow. -- BMET daily   8. Right carotid stenosis: Seen by [facility] during this hospitalization. Plan is to defer management until after PCI then TAVR  - I do expect that she will probably need at least 2 days to recover after her teeth extraction - As she is borderline euvolemic now, she would potentially be a very discharged over the weekend, she is tentatively posted for PCI on Friday of next week with Dr. Angelena Form.    Glenetta Hew, M.D., M.S. Interventional Cardiologist   Pager # [phone] Phone # [phone] [address] [place], [place] [address]

## 2016-12-22 ENCOUNTER — Other Ambulatory Visit: Payer: Self-pay | Admitting: Cardiology

## 2016-12-22 DIAGNOSIS — K047 Periapical abscess without sinus: Secondary | ICD-10-CM | POA: Diagnosis present

## 2016-12-22 DIAGNOSIS — N183 Chronic kidney disease, stage 3 unspecified: Secondary | ICD-10-CM | POA: Diagnosis present

## 2016-12-22 DIAGNOSIS — S301XXA Contusion of abdominal wall, initial encounter: Secondary | ICD-10-CM | POA: Diagnosis not present

## 2016-12-22 LAB — BASIC METABOLIC PANEL
Anion gap: 6 (ref 5–15)
BUN: 44 mg/dL — AB (ref 6–20)
CALCIUM: 9.2 mg/dL (ref 8.9–10.3)
CO2: 24 mmol/L (ref 22–32)
Chloride: 105 mmol/L (ref 101–111)
Creatinine, Ser: 1.69 mg/dL — ABNORMAL HIGH (ref 0.44–1.00)
GFR calc Af Amer: 32 mL/min — ABNORMAL LOW (ref >60)
GFR, EST NON AFRICAN AMERICAN: 28 mL/min — AB (ref >60)
GLUCOSE: 234 mg/dL — AB (ref 65–99)
Potassium: 4.3 mmol/L (ref 3.5–5.1)
Sodium: 135 mmol/L (ref 135–145)

## 2016-12-22 LAB — CBC
HEMATOCRIT: 26.3 % — AB (ref 36.0–46.0)
Hemoglobin: 8.4 g/dL — ABNORMAL LOW (ref 12.0–15.0)
MCH: 30 pg (ref 26.0–34.0)
MCHC: 31.9 g/dL (ref 30.0–36.0)
MCV: 93.9 fL (ref 78.0–100.0)
PLATELETS: 116 10*3/uL — AB (ref 150–400)
RBC: 2.8 MIL/uL — ABNORMAL LOW (ref 3.87–5.11)
RDW: 16.4 % — AB (ref 11.5–15.5)
WBC: 6.8 10*3/uL (ref 4.0–10.5)

## 2016-12-22 LAB — GLUCOSE, CAPILLARY
GLUCOSE-CAPILLARY: 175 mg/dL — AB (ref 65–99)
Glucose-Capillary: 196 mg/dL — ABNORMAL HIGH (ref 65–99)

## 2016-12-22 MED ORDER — ENSURE ENLIVE PO LIQD: 237.0000 mL | Freq: Three times a day (TID) | ORAL | 12 refills | Status: DC

## 2016-12-22 MED ORDER — HYDROCODONE-ACETAMINOPHEN 5-325 MG PO TABS: 1.0000 | ORAL_TABLET | Freq: Four times a day (QID) | ORAL | 0 refills | Status: DC | PRN

## 2016-12-22 MED ORDER — METOPROLOL TARTRATE 25 MG PO TABS: 25.0000 mg | ORAL_TABLET | Freq: Two times a day (BID) | ORAL | 5 refills | Status: DC

## 2016-12-22 MED ORDER — ACETAMINOPHEN 325 MG PO TABS: 650.0000 mg | ORAL_TABLET | Freq: Four times a day (QID) | ORAL | Status: DC | PRN

## 2016-12-22 MED ORDER — FUROSEMIDE 40 MG PO TABS: 40.0000 mg | ORAL_TABLET | Freq: Every day | ORAL | 3 refills | Status: DC

## 2016-12-22 MED ORDER — HYDRALAZINE HCL 50 MG PO TABS: 50.0000 mg | ORAL_TABLET | Freq: Three times a day (TID) | ORAL | 3 refills | Status: DC

## 2016-12-22 MED ORDER — NITROGLYCERIN 0.4 MG SL SUBL: 0.4000 mg | SUBLINGUAL_TABLET | SUBLINGUAL | Status: DC | PRN

## 2016-12-22 MED ORDER — ASPIRIN 81 MG PO CHEW: 81.0000 mg | CHEWABLE_TABLET | Freq: Every day | ORAL | Status: DC

## 2016-12-22 NOTE — Discharge Instructions (Addendum)
You are scheduled for PCI on Friday August 3rd. The office will call you with further instructions. If you haven't heard from them by Wednesday, please give the office a call. You will need to be nothing to eat or drink after midnight the night before your procedure.   MOUTH CARE AFTER SURGERY  FACTS:  Ice used in ice bag helps keep the swelling down, and can help lessen the pain.  It is easier to treat pain BEFORE it happens.  Spitting disturbs the clot and may cause bleeding to start again, or to get worse.  Smoking delays healing and can cause complications.  Sharing prescriptions can be dangerous.  Do not take medications not recently prescribed for you.  Antibiotics may stop birth control pills from working.  Use other means of birth control while on antibiotics.  Warm salt water rinses after the first 24 hours will help lessen the swelling:  Use 1/2 teaspoonful of table salt per oz.of water.  DO NOT:  Do not spit.  Do not drink through a straw.  Strongly advised not to smoke, dip snuff or chew tobacco at least for 3 days.  Do not eat sharp or crunchy foods.  Avoid the area of surgery when chewing.  Do not stop your antibiotics before your instructions say to do so.  Do not eat hot foods until bleeding has stopped.  If you need to, let your food cool down to room temperature.  EXPECT:  Some swelling, especially first 2-3 days.  Soreness or discomfort in varying degrees.  Follow your dentist's instructions about how to handle pain before it starts.  Pinkish saliva or light blood in saliva, or on your pillow in the morning.  This can last around 24 hours.  Bruising inside or outside the mouth.  This may not show up until 2-3 days after surgery.  Don't worry, it will go away in time.  Pieces of "bone" may work themselves loose.  It's OK.  If they bother you, let us know.  WHAT TO DO IMMEDIATELY AFTER SURGERY:  Bite on the gauze with steady pressure for 1-2 hours.   Don't chew on the gauze.  Do not lie down flat.  Raise your head support especially for the first 24 hours.  Apply ice to your face on the side of the surgery.  You may apply it 20 minutes on and a few minutes off.  Ice for 8-12 hours.  You may use ice up to 24 hours.  Before the numbness wears off, take a pain pill as instructed.  Prescription pain medication is not always required.  SWELLING:  Expect swelling for the first couple of days.  It should get better after that.  If swelling increases 3 days or so after surgery; let us know as soon as possible.  FEVER:  Take Tylenol every 4 hours if needed to lower your temperature, especially if it is at 100F or higher.  Drink lots of fluids.  If the fever does not go away, let us know.  BREATHING TROUBLE:  Any unusual difficulty breathing means you have to have someone bring you to the emergency room ASAP  BLEEDING:  Light oozing is expected for 24 hours or so.  Prop head up with pillows  Avoid spitting  Do not confuse bright red fresh flowing blood with lots of saliva colored with a little bit of blood.  If you notice some bleeding, place gauze or a tea bag where it is bleeding and apply  CONSTANT pressure by biting down for 1 hour.  Avoid talking during this time.  Do not remove the gauze or tea bag during this hour to "check" the bleeding.  If you notice bright RED bleeding FLOWING out of particular area, and filling the floor of your mouth, put a wad of gauze on that area, bite down firmly and constantly.  Call us immediately.  If we're closed, have someone bring you to the emergency room.  ORAL HYGIENE:  Brush your teeth as usual after meals and before bedtime.  Use a soft toothbrush around the area of surgery.  DO NOT AVOID BRUSHING.  Otherwise bacteria(germs) will grow and may delay healing or encourage infection.  Since you cannot spit, just gently rinse and let the water flow out of your mouth.  DO NOT SWISH  HARD.  EATING:  Cool liquids are a good point to start.  Increase to soft foods as tolerated.  PRESCRIPTIONS:  Follow the directions for your prescriptions exactly as written.  If Dr. Enrique Sack gave you a narcotic pain medication, do not drive, operate machinery or drink alcohol when on that medication.  QUESTIONS:  Call our office during office hours [phone] or call the Emergency Room at [phone].

## 2016-12-22 NOTE — Progress Notes (Signed)
Progress Note  Patient Name: Carrie Monroe Date of Encounter: 12/22/2016  Primary Cardiologist: Croitoru  Subjective   Partial doesn't fit after dental extraction no dyspnea or chest pain  Inpatient Medications    Scheduled Meds: . aspirin  81 mg Oral Daily  . feeding supplement (ENSURE ENLIVE)  237 mL Oral TID BM  . furosemide  40 mg Oral BID  . hydrALAZINE  50 mg Oral Q8H  . insulin aspart  0-15 Units Subcutaneous TID WC  . metoprolol tartrate  25 mg Oral BID  . rosuvastatin  10 mg Oral Daily  . tamoxifen  20 mg Oral q morning - 10a   Continuous Infusions: . sodium chloride     PRN Meds: sodium chloride, acetaminophen, HYDROcodone-acetaminophen, ondansetron (ZOFRAN) IV   Vital Signs    Vitals:   12/21/16 2136 12/21/16 2142 12/22/16 0518 12/22/16 0805  BP: (!) 132/43 (!) 145/48 (!) 156/60 (!) 123/42  Pulse: 82 82 68 90  Resp:  19 17 17   Temp:  99.5 F (37.5 C) 98.2 F (36.8 C)   TempSrc:  Axillary Oral   SpO2:  94% 96% (!) 86%  Weight:   255 lb 3.2 oz (115.8 kg)   Height:        Intake/Output Summary (Last 24 hours) at 12/22/16 0913 Last data filed at 12/22/16 0800  Gross per 24 hour  Intake             1840 ml  Output             1175 ml  Net              665 ml   Filed Weights   12/21/16 0542 12/21/16 0950 12/22/16 0518  Weight: 251 lb 6.4 oz (114 kg) 251 lb (113.9 kg) 255 lb 3.2 oz (115.8 kg)    Telemetry    NSR 12/22/2016  - Personally Reviewed  ECG    NSR no acute ST changes  - Personally Reviewed  Physical Exam   GEN: No acute distress.   Neck: No JVD Cardiac: RRR, no murmurs, rubs, or gallops.  Respiratory: Clear to auscultation bilaterally. GI: Soft, nontender, non-distended  MS: No edema; No deformity. Neuro:  Nonfocal  Psych: Normal affect  Post dental extraction  Labs    Chemistry Recent Labs Lab 12/18/16 0230  12/20/16 0238 12/21/16 0328 12/22/16 0218  NA 138  < > 137 138 135  K 4.6  < > 4.1 4.7 4.3  CL 109   < > 105 106 105  CO2 23  < > 24 23 24   GLUCOSE 176*  < > 169* 159* 234*  BUN 36*  < > 50* 56* 44*  CREATININE 1.76*  < > 1.90* 1.83* 1.69*  CALCIUM 9.1  < > 9.2 9.3 9.2  PROT 5.3*  --   --   --   --   ALBUMIN 2.9*  --   --   --   --   AST 19  --   --   --   --   ALT 12*  --   --   --   --   ALKPHOS 48  --   --   --   --   BILITOT 0.8  --   --   --   --   GFRNONAA 27*  < > 24* 25* 28*  GFRAA 31*  < > 28* 29* 32*  ANIONGAP 6  < > 8 9 6   < > =  values in this interval not displayed.   Hematology Recent Labs Lab 12/20/16 0238 12/21/16 0328 12/22/16 0218  WBC 7.4 6.9 6.8  RBC 3.03* 3.01* 2.80*  HGB 8.9* 9.0* 8.4*  HCT 27.6* 27.4* 26.3*  MCV 91.1 91.0 93.9  MCH 29.4 29.9 30.0  MCHC 32.2 32.8 31.9  RDW 16.4* 16.2* 16.4*  PLT 117* 124* 116*    Cardiac EnzymesNo results for input(s): TROPONINI in the last 168 hours. No results for input(s): TROPIPOC in the last 168 hours.   BNPNo results for input(s): BNP, PROBNP in the last 168 hours.   DDimer No results for input(s): DDIMER in the last 168 hours.   Radiology    No results found.  Cardiac Studies   Cath 12/17/16  Left Heart Cath and Coronary Angiography  Conclusion     Prox Cx to Mid Cx lesion, 99 %stenosed.  Ost 2nd Diag to 2nd Diag lesion, 70 %stenosed.  Mid LAD to Dist LAD lesion, 90 %stenosed.  Ost RCA to Prox RCA lesion, 40 %stenosed.  Mid RCA lesion, 80 %stenosed.  There is severe aortic valve stenosis.   1. Severe triple vessel CAD 2. Severe stenosis mid LAD involving a moderate caliber diagonal branch 3. Severe stenosis distal Circumflex artery 4. Severe stenosis mid RCA 5. Severe aortic valve stenosis (mean gradient 32 mmHg, peak to peak gradient 40 mmHg)    Echo: 08/2016 Impressions:  - Compared to a prior study in 2017, there is now severe aortic   valve stenosis. AVA around 0.7 cm2 - mean gradient of 46 mmHg.   LVEF is stable at 60-65%. Patient Profile     81 y.o. female with history  of chronic diastolic CHF, HTN, HLD, DM, gout, breast cancer and severe aortic stenosis   Assessment & Plan    1) CAD:  For PCI Friday with Dr Dinah Beers continue ASA and beta blocker 2) AS:  TAVR evaluation once CAD stabilized 3) Dental:  Post tooth extraction encouraged her to set up appt with private dentist as her partial Plate may need to be revised.   Will need lab work Thursday to recheck labs and orders for cath with Dr Jearld Lesch Friday PA to arrange  Signed, Jenkins Rouge, MD  12/22/2016, 9:13 AM

## 2016-12-22 NOTE — Discharge Summary (Signed)
Patient ID: Carrie Monroe,  MRN: [id number], DOB/AGE: [birth year] 81 y.o.  Admit date: 12/17/2016 Discharge date: 12/22/2016  Primary Care Provider: Marton Redwood, MD Primary Cardiologist: Dr Croitoru/ Dr Julianne Handler for TAVR  Discharge Diagnoses Principal Problem:   Dyspnea on exertion Active Problems:   Severe aortic stenosis   Acute on chronic combined systolic and diastolic heart failure, NYHA class 3 (HCC)   CAD- severe 3V CAD    Chronic renal insufficiency, stage III (moderate)   Breast cancer of upper-outer quadrant of left female breast (HCC)   Diabetes mellitus type 2 in obese (HCC)   Postoperative anemia due to acute blood loss   Essential hypertension   Stenosis of right carotid artery   Morbid obesity due to excess calories (HCC)   Groin hematoma post cath 12/17/16   Dental abscess- s/p multiple tooth extraction 12/21/16   Procedures: Coronary angiogram 12/17/16                        Dental extraction 12/21/16   Hospital Course:  81 yo female with history of chronic diastolic CHF, HTN, HLD, DM, CRI-3, morbid obesity, gout, breast cancer, and severe aortic stenosis who saw Dr Julianne Handler to discuss TAVR.  She is known to have had aortic stenosis for several years. Most recent echo 09/04/16 with normal LV systolic function, severe aortic stenosis with calcified leaflets, restricted leaflet motion, mean gradient 46 mmHg, peak gradient 81 mmHg, AVA 0.72 cm2. She has been having worsened dyspnea with exertion for the past 6 months with mild LE edema. Dr Julianne Handler decided to proceed with coronary angiogram and this was done 12/17/16. Her cardiac cath showed severe triple vessel CAD. She has severe AS. She was volume overloaded and he could not complete her right heart cath due to inability to get venous access. The procedure was also complicated by a Rt groin hematoma. She was admitted for diuresis and CVTS consult.   The pt was seen by Dr Cyndia Bent on 12/18/16. He felt that  shewould be at high risk for open surgical AVR and CABG given her age and multiple co morbidities. The other option would be PCI of her CAD and then TAVR for her severe AS. Either way her risk of worsening renal failure is increased due to the need for IV contrast vs a prolonged pump run. The pt indicated she had no interest in open surgery. She says that she is the sole caregiver for her husband who can't ambulate due to neuropathy. She would rather have PCI and TAVR. She also had an abscessed tooth and was seen by Dr Enrique Sack 12/19/16. She underwent multiple tooth extraction 12/21/16. She was seen by Dr Johnsie Cancel on 12/22/16 and felt to be stable for discharge. She return Friday for elective PCI, labs Thursday. She'll need a radial approach secondary to her groin hematoma. After her renal function stabilizes post PCI she'll need a CTA to complete her TAVR work up.    Discharge Vitals:  Blood pressure (!) 123/42, pulse 90, temperature 98.2 F (36.8 C), temperature source Oral, resp. rate 17, height 5\' 3"  (1.6 m), weight 255 lb 3.2 oz (115.8 kg), SpO2 (!) 86 %.    Labs: Results for orders placed or performed during the hospital encounter of 12/17/16 (from the past 24 hour(s))  Glucose, capillary     Status: Abnormal   Collection Time: 12/21/16 12:46 PM  Result Value Ref Range   Glucose-Capillary 171 (H) 65 - 99  mg/dL   Comment 1 Notify RN    Comment 2 Document in Chart   Glucose, capillary     Status: Abnormal   Collection Time: 12/21/16  4:31 PM  Result Value Ref Range   Glucose-Capillary 201 (H) 65 - 99 mg/dL   Comment 1 Notify RN   Glucose, capillary     Status: Abnormal   Collection Time: 12/21/16  9:41 PM  Result Value Ref Range   Glucose-Capillary 229 (H) 65 - 99 mg/dL  CBC     Status: Abnormal   Collection Time: 12/22/16  2:18 AM  Result Value Ref Range   WBC 6.8 4.0 - 10.5 K/uL   RBC 2.80 (L) 3.87 - 5.11 MIL/uL   Hemoglobin 8.4 (L) 12.0 - 15.0 g/dL   HCT 26.3 (L) 36.0 - 46.0 %    MCV 93.9 78.0 - 100.0 fL   MCH 30.0 26.0 - 34.0 pg   MCHC 31.9 30.0 - 36.0 g/dL   RDW 16.4 (H) 11.5 - 15.5 %   Platelets 116 (L) 150 - 400 K/uL  Basic metabolic panel     Status: Abnormal   Collection Time: 12/22/16  2:18 AM  Result Value Ref Range   Sodium 135 135 - 145 mmol/L   Potassium 4.3 3.5 - 5.1 mmol/L   Chloride 105 101 - 111 mmol/L   CO2 24 22 - 32 mmol/L   Glucose, Bld 234 (H) 65 - 99 mg/dL   BUN 44 (H) 6 - 20 mg/dL   Creatinine, Ser 1.69 (H) 0.44 - 1.00 mg/dL   Calcium 9.2 8.9 - 10.3 mg/dL   GFR calc non Af Amer 28 (L) >60 mL/min   GFR calc Af Amer 32 (L) >60 mL/min   Anion gap 6 5 - 15  Glucose, capillary     Status: Abnormal   Collection Time: 12/22/16  7:21 AM  Result Value Ref Range   Glucose-Capillary 175 (H) 65 - 99 mg/dL    Disposition:  Follow-up Information    Call Lenn Cal, DDS.   Specialty:  Dentistry Why:  For suture removal in 7-10 days Contact information: [place] [address] [phone]           Discharge Medications:  Allergies as of 12/22/2016   No Known Allergies     Medication List    STOP taking these medications   lisinopril 20 MG tablet Commonly known as:  PRINIVIL,ZESTRIL   metFORMIN 1000 MG tablet Commonly known as:  GLUCOPHAGE   naproxen sodium 220 MG tablet Commonly known as:  ANAPROX     TAKE these medications   acetaminophen 325 MG tablet Commonly known as:  TYLENOL Take 2 tablets (650 mg total) by mouth every 6 (six) hours as needed for mild pain or headache.   allopurinol 300 MG tablet Commonly known as:  ZYLOPRIM Take 300 mg by mouth daily as needed (gout).   aspirin 81 MG chewable tablet Chew 1 tablet (81 mg total) by mouth daily.   feeding supplement (ENSURE ENLIVE) Liqd Take 237 mLs by mouth 3 (three) times daily between meals.   furosemide 40 MG tablet Commonly known as:  LASIX Take 1 tablet (40 mg total) by mouth daily. Take half tablet by mouth daily as needed for  swelling What changed:  how much to take  how to take this  when to take this   hydrALAZINE 50 MG tablet Commonly known as:  APRESOLINE Take 1 tablet (50 mg total) by  mouth every 8 (eight) hours.   metoprolol tartrate 25 MG tablet Commonly known as:  LOPRESSOR Take 1 tablet (25 mg total) by mouth 2 (two) times daily.   rosuvastatin 10 MG tablet Commonly known as:  CRESTOR Take 10 mg by mouth daily.   tamoxifen 20 MG tablet Commonly known as:  NOLVADEX Take 1 tablet by mouth every morning.   Vitamin D 2000 units Caps Take 2,000 Units by mouth daily.        Duration of Discharge Encounter: Greater than 30 minutes including physician time.  Angelena Form PA-C 12/22/2016 10:00 AM

## 2016-12-24 ENCOUNTER — Encounter (HOSPITAL_COMMUNITY): Payer: Self-pay | Admitting: Dentistry

## 2016-12-24 NOTE — Anesthesia Postprocedure Evaluation (Signed)
Anesthesia Post Note  Patient: ZYRA PARRILLO  Procedure(s) Performed: Procedure(s) (LRB): Extraction of tooth #'s 12, [id number] and 29 with alveoloplasty and gross debridement of remaining teeth. (N/A)     Patient location during evaluation: PACU Anesthesia Type: General Level of consciousness: awake Pain management: pain level controlled Vital Signs Assessment: post-procedure vital signs reviewed and stable Respiratory status: spontaneous breathing Cardiovascular status: stable Postop Assessment: no signs of nausea or vomiting Anesthetic complications: no    Last Vitals:  Vitals:   12/22/16 0518 12/22/16 0805  BP: (!) 156/60 (!) 123/42  Pulse: 68 90  Resp: 17 17  Temp: 36.8 C     Last Pain:  Vitals:   12/22/16 0910  TempSrc:   PainSc: Asleep                 Reinhart Saulters

## 2016-12-25 ENCOUNTER — Telehealth: Payer: Self-pay | Admitting: *Deleted

## 2016-12-25 DIAGNOSIS — I350 Nonrheumatic aortic (valve) stenosis: Secondary | ICD-10-CM

## 2016-12-25 DIAGNOSIS — I25110 Atherosclerotic heart disease of native coronary artery with unstable angina pectoris: Secondary | ICD-10-CM

## 2016-12-25 MED ORDER — HYDRALAZINE HCL 50 MG PO TABS
25.0000 mg | ORAL_TABLET | Freq: Three times a day (TID) | ORAL | 3 refills | Status: DC
Start: 2016-12-25 — End: 2017-01-18

## 2016-12-25 NOTE — Telephone Encounter (Signed)
Pt.notified

## 2016-12-25 NOTE — Telephone Encounter (Signed)
She can reduce the dose of hydralazine down to 25 mg TID and see if this helps. Carrie Monroe

## 2016-12-25 NOTE — Telephone Encounter (Signed)
Follow Up:; ° ° °Returning your call. °

## 2016-12-25 NOTE — Telephone Encounter (Signed)
Received message from cath lab that pt would need CBC, PT, BMP on 8/2.  Pt is scheduled for PCI on 8/3.  I spoke with pt and she will come in AM of 8/2 for labs.  I verbally went over procedure instructions with her.  She is aware to arrive at 8:00. She is no longer taking metformin (stopped in hospital) She reports since Sunday she has been feeling weak. Hears her heart beat in her ears.  No chest pain. No shortness of breath.  I told pt to let us know if weak feeling worsens or she has any other change in how she is feeling.

## 2016-12-25 NOTE — Telephone Encounter (Signed)
Thanks. cdm 

## 2016-12-25 NOTE — Telephone Encounter (Signed)
I spoke with pt. She reports since being discharged from the hospital she has felt weak.  Legs feel weak. She reports weakness in both arms.  This is constant. She states it "is hard to describe."  Not painful. She is afraid to drive and doesn't feel like she has the energy to walk out to the car. I told pt she should not drive while feeling this way.  She reports hydralazine and metoprolol were started while she was in the hospital and she thinks symptoms are related to these medications.She states she did not feel like this prior to starting these medications.  She is asking if medications can be changed.

## 2016-12-27 ENCOUNTER — Other Ambulatory Visit: Payer: Medicare Other | Admitting: *Deleted

## 2016-12-27 DIAGNOSIS — I350 Nonrheumatic aortic (valve) stenosis: Secondary | ICD-10-CM

## 2016-12-27 DIAGNOSIS — I25110 Atherosclerotic heart disease of native coronary artery with unstable angina pectoris: Secondary | ICD-10-CM

## 2016-12-27 LAB — BASIC METABOLIC PANEL
BUN/Creatinine Ratio: 31 — ABNORMAL HIGH (ref 12–28)
BUN: 45 mg/dL — ABNORMAL HIGH (ref 8–27)
CO2: 24 mmol/L (ref 20–29)
Calcium: 9.9 mg/dL (ref 8.7–10.3)
Chloride: 102 mmol/L (ref 96–106)
Creatinine, Ser: 1.46 mg/dL — ABNORMAL HIGH (ref 0.57–1.00)
GFR calc Af Amer: 39 mL/min/{1.73_m2} — ABNORMAL LOW (ref >59)
GFR, EST NON AFRICAN AMERICAN: 34 mL/min/{1.73_m2} — AB (ref >59)
GLUCOSE: 178 mg/dL — AB (ref 65–99)
POTASSIUM: 3.8 mmol/L (ref 3.5–5.2)
SODIUM: 136 mmol/L (ref 134–144)

## 2016-12-27 LAB — PROTIME-INR
INR: 1 (ref 0.8–1.2)
PROTHROMBIN TIME: 10.3 s (ref 9.1–12.0)

## 2016-12-27 LAB — CBC WITH DIFFERENTIAL/PLATELET
BASOS: 1 %
Basophils Absolute: 0 10*3/uL (ref 0.0–0.2)
EOS (ABSOLUTE): 0.2 10*3/uL (ref 0.0–0.4)
Eos: 3 %
HEMOGLOBIN: 10.1 g/dL — AB (ref 11.1–15.9)
Hematocrit: 29.6 % — ABNORMAL LOW (ref 34.0–46.6)
LYMPHS ABS: 1.4 10*3/uL (ref 0.7–3.1)
Lymphs: 23 %
MCH: 30.9 pg (ref 26.6–33.0)
MCHC: 34.1 g/dL (ref 31.5–35.7)
MCV: 91 fL (ref 79–97)
MONOS ABS: 0.5 10*3/uL (ref 0.1–0.9)
Monocytes: 9 %
NEUTROS PCT: 64 %
Neutrophils Absolute: 3.8 10*3/uL (ref 1.4–7.0)
PLATELETS: 145 10*3/uL — AB (ref 150–379)
RBC: 3.27 x10E6/uL — ABNORMAL LOW (ref 3.77–5.28)
RDW: 15.4 % (ref 12.3–15.4)
WBC: 5.9 10*3/uL (ref 3.4–10.8)

## 2016-12-28 ENCOUNTER — Other Ambulatory Visit: Payer: Self-pay | Admitting: *Deleted

## 2016-12-28 ENCOUNTER — Encounter (HOSPITAL_COMMUNITY): Payer: Self-pay | Admitting: Cardiovascular Disease

## 2016-12-28 ENCOUNTER — Ambulatory Visit (HOSPITAL_COMMUNITY)
Admission: RE | Admit: 2016-12-28 | Discharge: 2016-12-29 | Disposition: A | Discharge status: Home / Self Care | Payer: Medicare Other | Source: Ambulatory Visit | Attending: Cardiovascular Disease | Admitting: Cardiovascular Disease

## 2016-12-28 ENCOUNTER — Encounter (HOSPITAL_COMMUNITY)
Admission: RE | Disposition: A | Discharge status: Home / Self Care | Payer: Self-pay | Source: Ambulatory Visit | Attending: Cardiovascular Disease

## 2016-12-28 DIAGNOSIS — M109 Gout, unspecified: Secondary | ICD-10-CM | POA: Diagnosis present

## 2016-12-28 DIAGNOSIS — Z96652 Presence of left artificial knee joint: Secondary | ICD-10-CM | POA: Insufficient documentation

## 2016-12-28 DIAGNOSIS — N183 Chronic kidney disease, stage 3 unspecified: Secondary | ICD-10-CM | POA: Diagnosis present

## 2016-12-28 DIAGNOSIS — I10 Essential (primary) hypertension: Secondary | ICD-10-CM | POA: Diagnosis present

## 2016-12-28 DIAGNOSIS — I200 Unstable angina: Secondary | ICD-10-CM | POA: Diagnosis present

## 2016-12-28 DIAGNOSIS — C50412 Malignant neoplasm of upper-outer quadrant of left female breast: Secondary | ICD-10-CM | POA: Diagnosis not present

## 2016-12-28 DIAGNOSIS — I25110 Atherosclerotic heart disease of native coronary artery with unstable angina pectoris: Secondary | ICD-10-CM | POA: Diagnosis present

## 2016-12-28 DIAGNOSIS — K047 Periapical abscess without sinus: Secondary | ICD-10-CM | POA: Diagnosis not present

## 2016-12-28 DIAGNOSIS — M1711 Unilateral primary osteoarthritis, right knee: Secondary | ICD-10-CM | POA: Insufficient documentation

## 2016-12-28 DIAGNOSIS — Z79899 Other long term (current) drug therapy: Secondary | ICD-10-CM | POA: Insufficient documentation

## 2016-12-28 DIAGNOSIS — I350 Nonrheumatic aortic (valve) stenosis: Secondary | ICD-10-CM | POA: Insufficient documentation

## 2016-12-28 DIAGNOSIS — E1122 Type 2 diabetes mellitus with diabetic chronic kidney disease: Secondary | ICD-10-CM | POA: Insufficient documentation

## 2016-12-28 DIAGNOSIS — Z6841 Body Mass Index (BMI) 40.0 and over, adult: Secondary | ICD-10-CM | POA: Insufficient documentation

## 2016-12-28 DIAGNOSIS — I2510 Atherosclerotic heart disease of native coronary artery without angina pectoris: Secondary | ICD-10-CM | POA: Diagnosis present

## 2016-12-28 DIAGNOSIS — Z7984 Long term (current) use of oral hypoglycemic drugs: Secondary | ICD-10-CM | POA: Diagnosis not present

## 2016-12-28 DIAGNOSIS — E785 Hyperlipidemia, unspecified: Secondary | ICD-10-CM | POA: Diagnosis not present

## 2016-12-28 DIAGNOSIS — E669 Obesity, unspecified: Secondary | ICD-10-CM

## 2016-12-28 DIAGNOSIS — E1169 Type 2 diabetes mellitus with other specified complication: Secondary | ICD-10-CM | POA: Diagnosis present

## 2016-12-28 DIAGNOSIS — I5032 Chronic diastolic (congestive) heart failure: Secondary | ICD-10-CM | POA: Insufficient documentation

## 2016-12-28 DIAGNOSIS — I130 Hypertensive heart and chronic kidney disease with heart failure and stage 1 through stage 4 chronic kidney disease, or unspecified chronic kidney disease: Secondary | ICD-10-CM | POA: Diagnosis not present

## 2016-12-28 DIAGNOSIS — I6529 Occlusion and stenosis of unspecified carotid artery: Secondary | ICD-10-CM | POA: Diagnosis present

## 2016-12-28 DIAGNOSIS — N289 Disorder of kidney and ureter, unspecified: Secondary | ICD-10-CM

## 2016-12-28 HISTORY — DX: Chronic diastolic (congestive) heart failure: I50.32

## 2016-12-28 HISTORY — DX: Nonrheumatic aortic (valve) stenosis: I35.0

## 2016-12-28 HISTORY — DX: Chronic kidney disease, unspecified: N18.9

## 2016-12-28 HISTORY — PX: CORONARY STENT INTERVENTION: CATH118234

## 2016-12-28 LAB — GLUCOSE, CAPILLARY
GLUCOSE-CAPILLARY: 143 mg/dL — AB (ref 65–99)
GLUCOSE-CAPILLARY: 139 mg/dL — AB (ref 65–99)
GLUCOSE-CAPILLARY: 182 mg/dL — AB (ref 65–99)
Glucose-Capillary: 163 mg/dL — ABNORMAL HIGH (ref 65–99)

## 2016-12-28 LAB — POCT ACTIVATED CLOTTING TIME
ACTIVATED CLOTTING TIME: 577 s
Activated Clotting Time: 565 seconds
Activated Clotting Time: 252 seconds

## 2016-12-28 SURGERY — CORONARY STENT INTERVENTION
Anesthesia: LOCAL

## 2016-12-28 MED ORDER — SODIUM CHLORIDE 0.9 % IV SOLN: 250.0000 mL | INTRAVENOUS | Status: DC | PRN

## 2016-12-28 MED ORDER — CLOPIDOGREL BISULFATE 75 MG PO TABS
75.0000 mg | ORAL_TABLET | Freq: Every day | ORAL | Status: DC
  Administered 2016-12-29: 07:00:00 75 mg via ORAL
  Filled 2016-12-28: qty 1

## 2016-12-28 MED ORDER — FENTANYL CITRATE (PF) 100 MCG/2ML IJ SOLN
INTRAMUSCULAR | Status: AC
  Filled 2016-12-28: qty 2

## 2016-12-28 MED ORDER — FUROSEMIDE 40 MG PO TABS
40.0000 mg | ORAL_TABLET | Freq: Every day | ORAL | Status: DC
  Administered 2016-12-28 – 2016-12-29 (×2): 40 mg via ORAL
  Filled 2016-12-28 (×2): qty 1

## 2016-12-28 MED ORDER — VITAMIN D 1000 UNITS PO TABS
2000.0000 [IU] | ORAL_TABLET | Freq: Every day | ORAL | Status: DC
  Administered 2016-12-29: 09:00:00 2000 [IU] via ORAL
  Filled 2016-12-28: qty 2

## 2016-12-28 MED ORDER — IOPAMIDOL (ISOVUE-370) INJECTION 76%
INTRAVENOUS | Status: DC | PRN
  Administered 2016-12-28: 185 mL via INTRA_ARTERIAL

## 2016-12-28 MED ORDER — LABETALOL HCL 5 MG/ML IV SOLN: 10.0000 mg | INTRAVENOUS | Status: AC | PRN

## 2016-12-28 MED ORDER — HYDROCODONE-ACETAMINOPHEN 5-325 MG PO TABS
1.0000 | ORAL_TABLET | Freq: Four times a day (QID) | ORAL | Status: DC | PRN
  Filled 2016-12-28: qty 1

## 2016-12-28 MED ORDER — IOPAMIDOL (ISOVUE-370) INJECTION 76%
INTRAVENOUS | Status: AC
  Filled 2016-12-28: qty 125

## 2016-12-28 MED ORDER — HEPARIN (PORCINE) IN NACL 2-0.9 UNIT/ML-% IJ SOLN
INTRAMUSCULAR | Status: AC
  Filled 2016-12-28: qty 1000

## 2016-12-28 MED ORDER — ACTIVE PARTNERSHIP FOR HEALTH OF YOUR HEART BOOK
Freq: Once | Status: AC
  Administered 2016-12-28: 20:00:00
  Filled 2016-12-28: qty 1

## 2016-12-28 MED ORDER — ROSUVASTATIN CALCIUM 10 MG PO TABS
10.0000 mg | ORAL_TABLET | Freq: Every day | ORAL | Status: DC
  Administered 2016-12-29: 10 mg via ORAL
  Filled 2016-12-28: qty 1

## 2016-12-28 MED ORDER — HYDRALAZINE HCL 20 MG/ML IJ SOLN: 5.0000 mg | INTRAMUSCULAR | Status: AC | PRN

## 2016-12-28 MED ORDER — SODIUM CHLORIDE 0.9 % IV SOLN
INTRAVENOUS | Status: DC
  Administered 2016-12-28: 09:00:00 via INTRAVENOUS

## 2016-12-28 MED ORDER — MIDAZOLAM HCL 2 MG/2ML IJ SOLN
INTRAMUSCULAR | Status: AC
  Filled 2016-12-28: qty 2

## 2016-12-28 MED ORDER — IOPAMIDOL (ISOVUE-370) INJECTION 76%
INTRAVENOUS | Status: AC
  Filled 2016-12-28: qty 50

## 2016-12-28 MED ORDER — HEPARIN (PORCINE) IN NACL 2-0.9 UNIT/ML-% IJ SOLN
INTRAMUSCULAR | Status: DC | PRN
  Administered 2016-12-28: 1000 mL

## 2016-12-28 MED ORDER — FENTANYL CITRATE (PF) 100 MCG/2ML IJ SOLN
INTRAMUSCULAR | Status: DC | PRN
  Administered 2016-12-28 (×3): 25 ug via INTRAVENOUS

## 2016-12-28 MED ORDER — ALLOPURINOL 300 MG PO TABS: 300.0000 mg | ORAL_TABLET | Freq: Every day | ORAL | Status: DC | PRN

## 2016-12-28 MED ORDER — ACETAMINOPHEN 325 MG PO TABS: 650.0000 mg | ORAL_TABLET | ORAL | Status: DC | PRN

## 2016-12-28 MED ORDER — SODIUM CHLORIDE 0.9 % IV SOLN
INTRAVENOUS | Status: AC
  Administered 2016-12-28: 13:00:00 via INTRAVENOUS

## 2016-12-28 MED ORDER — NITROGLYCERIN 1 MG/10 ML FOR IR/CATH LAB
INTRA_ARTERIAL | Status: AC
  Filled 2016-12-28: qty 10

## 2016-12-28 MED ORDER — TAMOXIFEN CITRATE 10 MG PO TABS
20.0000 mg | ORAL_TABLET | Freq: Every morning | ORAL | Status: DC
  Administered 2016-12-29: 09:00:00 20 mg via ORAL
  Filled 2016-12-28: qty 2

## 2016-12-28 MED ORDER — VERAPAMIL HCL 2.5 MG/ML IV SOLN
INTRAVENOUS | Status: AC
  Filled 2016-12-28: qty 2

## 2016-12-28 MED ORDER — HYDRALAZINE HCL 25 MG PO TABS
25.0000 mg | ORAL_TABLET | Freq: Three times a day (TID) | ORAL | Status: DC
  Administered 2016-12-28 – 2016-12-29 (×3): 25 mg via ORAL
  Filled 2016-12-28 (×3): qty 1

## 2016-12-28 MED ORDER — ONDANSETRON HCL 4 MG/2ML IJ SOLN: 4.0000 mg | Freq: Four times a day (QID) | INTRAMUSCULAR | Status: DC | PRN

## 2016-12-28 MED ORDER — VERAPAMIL HCL 2.5 MG/ML IV SOLN
INTRAVENOUS | Status: DC | PRN
  Administered 2016-12-28: 10 mL via INTRA_ARTERIAL

## 2016-12-28 MED ORDER — ASPIRIN 81 MG PO CHEW
81.0000 mg | CHEWABLE_TABLET | Freq: Every day | ORAL | Status: DC
  Administered 2016-12-29: 81 mg via ORAL
  Filled 2016-12-28: qty 1

## 2016-12-28 MED ORDER — NITROGLYCERIN 1 MG/10 ML FOR IR/CATH LAB
INTRA_ARTERIAL | Status: DC | PRN
  Administered 2016-12-28: 200 ug via INTRACORONARY

## 2016-12-28 MED ORDER — HEPARIN SODIUM (PORCINE) 1000 UNIT/ML IJ SOLN
INTRAMUSCULAR | Status: AC
  Filled 2016-12-28: qty 1

## 2016-12-28 MED ORDER — SODIUM CHLORIDE 0.9% FLUSH: 3.0000 mL | INTRAVENOUS | Status: DC | PRN

## 2016-12-28 MED ORDER — CLOPIDOGREL BISULFATE 300 MG PO TABS
ORAL_TABLET | ORAL | Status: AC
  Administered 2016-12-28: 600 mg via ORAL
  Filled 2016-12-28: qty 2

## 2016-12-28 MED ORDER — ANGIOPLASTY BOOK
Freq: Once | Status: AC
  Administered 2016-12-28: 20:00:00
  Filled 2016-12-28: qty 1

## 2016-12-28 MED ORDER — METOPROLOL TARTRATE 12.5 MG HALF TABLET
25.0000 mg | ORAL_TABLET | Freq: Two times a day (BID) | ORAL | Status: DC
  Administered 2016-12-28 – 2016-12-29 (×2): 25 mg via ORAL
  Filled 2016-12-28 (×2): qty 2

## 2016-12-28 MED ORDER — LIDOCAINE HCL (PF) 1 % IJ SOLN
INTRAMUSCULAR | Status: AC
  Filled 2016-12-28: qty 30

## 2016-12-28 MED ORDER — MIDAZOLAM HCL 2 MG/2ML IJ SOLN
INTRAMUSCULAR | Status: DC | PRN
  Administered 2016-12-28 (×3): 1 mg via INTRAVENOUS

## 2016-12-28 MED ORDER — NITROGLYCERIN 0.4 MG SL SUBL: 0.4000 mg | SUBLINGUAL_TABLET | SUBLINGUAL | Status: DC | PRN

## 2016-12-28 MED ORDER — SODIUM CHLORIDE 0.9% FLUSH: 3.0000 mL | Freq: Two times a day (BID) | INTRAVENOUS | Status: DC

## 2016-12-28 MED ORDER — LIDOCAINE HCL (PF) 1 % IJ SOLN
INTRAMUSCULAR | Status: DC | PRN
  Administered 2016-12-28: 2 mL via INTRADERMAL

## 2016-12-28 MED ORDER — HEPARIN SODIUM (PORCINE) 1000 UNIT/ML IJ SOLN
INTRAMUSCULAR | Status: DC | PRN
  Administered 2016-12-28: 12000 [IU] via INTRAVENOUS

## 2016-12-28 MED ORDER — CLOPIDOGREL BISULFATE 300 MG PO TABS
600.0000 mg | ORAL_TABLET | Freq: Once | ORAL | Status: AC
  Administered 2016-12-28: 600 mg via ORAL

## 2016-12-28 SURGICAL SUPPLY — 23 items
BALLN SAPPHIRE 2.0X20 (BALLOONS) ×2
BALLN SAPPHIRE 2.5X12 (BALLOONS) ×2
BALLN SAPPHIRE ~~LOC~~ 2.75X12 (BALLOONS) ×1 IMPLANT
BALLN SAPPHIRE ~~LOC~~ 3.25X12 (BALLOONS) ×1 IMPLANT
BALLOON SAPPHIRE 2.0X20 (BALLOONS) IMPLANT
BALLOON SAPPHIRE 2.5X12 (BALLOONS) IMPLANT
CATH LAUNCHER 6FR EBU3.5 (CATHETERS) ×1 IMPLANT
CATH VISTA GUIDE 6FR XBLAD3.0 (CATHETERS) ×1 IMPLANT
COVER PRB 48X5XTLSCP FOLD TPE (BAG) IMPLANT
COVER PROBE 5X48 (BAG) ×2
DEVICE RAD COMP TR BAND LRG (VASCULAR PRODUCTS) ×1 IMPLANT
GLIDESHEATH SLEND SS 6F .021 (SHEATH) ×1 IMPLANT
GUIDEWIRE INQWIRE 1.5J.035X260 (WIRE) IMPLANT
INQWIRE 1.5J .035X260CM (WIRE) ×2
KIT ENCORE 26 ADVANTAGE (KITS) ×2 IMPLANT
KIT HEART LEFT (KITS) ×2 IMPLANT
PACK CARDIAC CATHETERIZATION (CUSTOM PROCEDURE TRAY) ×2 IMPLANT
STENT SYNERGY DES 2.25X12 (Permanent Stent) ×1 IMPLANT
STENT SYNERGY DES 2.5X28 (Permanent Stent) ×1 IMPLANT
STENT SYNERGY DES 3X16 (Permanent Stent) ×1 IMPLANT
TRANSDUCER W/STOPCOCK (MISCELLANEOUS) ×2 IMPLANT
TUBING CIL FLEX 10 FLL-RA (TUBING) ×2 IMPLANT
WIRE COUGAR XT STRL 190CM (WIRE) ×2 IMPLANT

## 2016-12-28 NOTE — Interval H&P Note (Signed)
History and Physical Interval Note:  12/28/2016 9:55 AM  Carrie Monroe  has presented today for cardiac cath/PCI with the diagnosis of severe CAD. The various methods of treatment have been discussed with the patient and family. After consideration of risks, benefits and other options for treatment, the patient has consented to  Procedure(s): Coronary Stent Intervention (N/A) as a surgical intervention .  [name] history has been reviewed, patient examined, no change in status, stable for surgery.  I have reviewed the patient's chart and labs.  Questions were answered to the patient's satisfaction.    Cath Lab Visit (complete for each Cath Lab visit)  Clinical Evaluation Leading to the Procedure:   ACS: No.  Non-ACS:    Anginal Classification: CCS III  Anti-ischemic medical therapy: Minimal Therapy (1 class of medications)  Non-Invasive Test Results: No non-invasive testing performed  Prior CABG: No previous CABG         Lauree Chandler

## 2016-12-28 NOTE — Progress Notes (Signed)
TR BAND REMOVAL  LOCATION:    right radial  DEFLATED PER PROTOCOL:    Yes.    TIME BAND OFF / DRESSING APPLIED:    1630   SITE UPON ARRIVAL:    Level 0  SITE AFTER BAND REMOVAL:    Level 0  CIRCULATION SENSATION AND MOVEMENT:    Within Normal Limits   Yes.    COMMENTS:    

## 2016-12-28 NOTE — H&P (View-Only) (Signed)
Valve Clinic Note  Referring: [place]  History of Present Illness: 81 yo female with history of chronic diastolic CHF, HTN, HLD, DM, gout, breast cancer and severe aortic stenosis who is here today to discuss TAVR. I saw her as a new patient 10/10/16. She was referred by Dr. Recardo Evangelist to discuss her aortic valve stenosis and possible TAVR. She had been followed for years by Dr. Mare Ferrari and most recently by Dr. Recardo Evangelist. She is known to have aortic stenosis for several years. Most recent echo 09/04/16 with normal LV systolic function, severe aortic stenosis with calcified leaflets, restricted leaflet motion, mean gradient 46 mmHg, peak gradient 81 mmHg, AVA 0.72 cm2. She has been having worsened dyspnea with exertion for the past 6 months with mild LE edema. No chest pain. No dizziness, near syncope or syncope. Energy level is down some. She is fairly sedentary but she does her housework, drives and does her own cooking. She is married. 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   At her first visit in the valve clinic, she wanted to have time to think about the TAVR before scheduling any tests. She is back today to discuss TAVR. She continues to have dyspnea which has worsened. She is now having LE edema. She has been taking lasix daily for the last two days. Prior to this she was using it as needed. No chest pain. No dizziness.   Primary Care Physician: Marton Redwood, MD Primary cardiology: Dr. Recardo Evangelist  Past Medical History:  Diagnosis Date  . Anxiety   . Aortic stenosis   . Arthritis    oa left knee  . Bilateral lower extremity edema   . Breast cancer ([place])   . Cancer [facility])    left breast CANCER, LUMPECTOMY AND RADIATION  . Carotid artery stenosis    bilateral  . Chest pain on exertion   . CHF (congestive heart failure) ([place])   . Decreased range of motion    RIGHT ARM - HX OF SHOULDER SURGERY AND LIMITED ABILTIY TO RAISE RT ARM ABOVE HEAD  . Depression   . Diabetes mellitus      DIET CONTROL  . Exogenous obesity   . Gout   . Heart murmur    hx mild aortic stenosis  . Hemorrhoids   . History of kidney stones   . Hyperlipidemia   . Hypertension   . Leg cramps    BILATERAL  . Shingles    YRS AGO - NO RESIDUAL PROBLEMS  . Shortness of breath    if she does not take her lasix and with exertion  . Stenosis of right carotid artery 01/27/2016    Past Surgical History:  Procedure Laterality Date  . BREAST LUMPECTOMY  2009   left  . BREAST LUMPECTOMY WITH NEEDLE LOCALIZATION Left 01/06/2015   Procedure: BREAST BIOPSY WITH NEEDLE LOCALIZATION AND SKIN BIOPSY;  Surgeon: Autumn Messing III, MD;  Location: [place];  Service: General;  Laterality: Left;  . CHOLECYSTECTOMY  2010  . EYE SURGERY     BILATERAL CATARACT EXTRACTIONS AND LENS IMPLANTS  . KNEE SURGERY     right PARTIAL KNEE REPLACEMENT  . SHOULDER SURGERY     right  . SKIN GRAFT TO RIGHT HAND     AFTER BURN TO THE HAND  . TOTAL KNEE ARTHROPLASTY Left 08/03/2013   Procedure: TOTAL LEFT KNEE ARTHROPLASTY;  Surgeon: Gearlean Alf, MD;  Location: [facility];  Service: Orthopedics;  Laterality: Left;  .  US ECHOCARDIOGRAPHY  05/15/2010   EF 60-65%    Current Outpatient Prescriptions  Medication Sig Dispense Refill  . allopurinol (ZYLOPRIM) 300 MG tablet Take 300 mg by mouth daily as needed (gout).    . furosemide (LASIX) 40 MG tablet Take 40 mg by mouth as needed.  2  . lisinopril (PRINIVIL,ZESTRIL) 20 MG tablet Take 1 tablet by mouth daily.  2  . metFORMIN (GLUCOPHAGE) 1000 MG tablet Take 1,000 mg by mouth daily.     . rosuvastatin (CRESTOR) 10 MG tablet Take 1 tablet by mouth daily.  0  . tamoxifen (NOLVADEX) 20 MG tablet Take 1 tablet by mouth every morning. 90 tablet 3  . VITAMIN D, ERGOCALCIFEROL, PO Take 2,000 Units by mouth daily.      No current facility-administered medications for this visit.     No Known Allergies  Social History   Social History  . Marital status: Married     Spouse name: N/A  . Number of children: 4  . Years of education: N/A   Occupational History  . 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 Smoking status: Never Smoker  . Smokeless tobacco: Never Used  . Alcohol use No  . Drug use: No  . Sexual activity: Not Currently   Other Topics Concern  . Not on file   Social History Narrative   Epworth Sleepiness scale score =11 as of 01/25/16    Family History  Problem Relation Age of Onset  . Cancer Mother        pancreatic  . Heart disease Father   . Hypertension Father   . Cancer Brother   . Diabetes Sister     Review of Systems:  As stated in the HPI and otherwise negative.   BP (!) 153/70   Pulse 93   Ht 5\' 4"  (1.626 m)   Wt 251 lb 12.8 oz (114.2 kg)   SpO2 94%   BMI 43.22 kg/m   Physical Examination: General: Well developed, well nourished, NAD  HEENT: OP clear, mucus membranes moist  SKIN: warm, dry. No rashes. Neuro: No focal deficits  Musculoskeletal: Muscle strength 5/5 all ext  Psychiatric: Mood and affect normal  Neck: No JVD, no carotid bruits, no thyromegaly, no lymphadenopathy.  Lungs:Clear bilaterally, no wheezes, rhonci, crackles Cardiovascular: Regular rate and rhythm. Loud harsh systolic murmur. No gallops or rubs. Abdomen:Soft. Bowel sounds present. Non-tender.  Extremities: 1+ bilateral lower extremity edema.   Echo 09/04/16: Left ventricle: The cavity size was normal. Wall thickness was   increased in a pattern of moderate LVH. Systolic function was   normal. The estimated ejection fraction was in the range of 60%   to 65%. Wall motion was normal; there were no regional wall   motion abnormalities. Doppler parameters are consistent with   pseudonormal left ventricular relaxation (grade 2 diastolic   dysfunction). LV filling pressure is elevated. - Aortic valve: Moderately calcified leaflets. Severe stenosis.   Mean gradient (S): 46 mm Hg. Peak gradient (S): 81 mm Hg.  Valve   area (Vmax): 0.72 cm^2. - Mitral valve: Posterior calcified annulus. Mildly thickened   leaflets . - Left atrium: Moderately dilated. - Pulmonic valve: There was mild regurgitation. - Inferior vena cava: The vessel was dilated. The respirophasic   diameter changes were blunted (< 50%), consistent with elevated   central venous pressure.  Impressions:  - Compared to a prior study in 2017, there is now severe aortic  valve stenosis. AVA around 0.7 cm2 - mean gradient of 46 mmHg.   LVEF is stable at 60-65%.  EKG:  EKG is not ordered today. The ekg ordered today demonstrates   Recent Labs: No results found for requested labs within last 8760 hours.   Lipid Panel No results found for: CHOL, TRIG, HDL, CHOLHDL, VLDL, LDLCALC, LDLDIRECT   Wt Readings from Last 3 Encounters:  12/06/16 251 lb 12.8 oz (114.2 kg)  10/10/16 252 lb 3.2 oz (114.4 kg)  10/01/16 257 lb (116.6 kg)     Other studies Reviewed: Additional studies/ records that were reviewed today include: . Review of the above records demonstrates:   STS Risk Score:  Risk of Mortality: 3.783%  Morbidity or Mortality: 18.894%  Long Length of Stay: 8.702%  Short Length of Stay: 25.804%  Permanent Stroke: 1.591%  Prolonged Ventilation: 12.901%  DSW Infection: 0.476%  Renal Failure: 7.079%  Reoperation: 6.324%   Assessment and Plan:   1. Severe aortic valve stenosis: She has stage D symptomatic aortic valve stenosis. We had a previous discussion regarding TAVR. I think she would be a good candidate for TAVR. She would not be an optimal candidate for surgical AVR. I have reviewed her echo images again today. Her valve leaflets are thickened and do not open well. She is now willing to proceed with TAVR. I have reviewed all risks of the procedure again with the patient and her daughter. I will arrange a right and left heart cath at [facility] 12/17/16 at 7:30am. She will need to be seen by one of the CT surgeons on our TAVR  team following the cath. We will arrange cardiac CT and CTA of the chest/abd/pelvis after her cath. Risks of cath reviewed. Pre-cath labs today.   Current medicines are reviewed at length with the patient today.  The patient does not have concerns regarding medicines.  The following changes have been made:  no change  Labs/ tests ordered today include:  No orders of the defined types were placed in this encounter.  Disposition:   FU with me in 8 weeks  Signed, Lauree Chandler, MD 12/06/2016 4:16 PM    [facility] [place], [place], [place]  [address] Phone: [phone]; Fax: [phone]

## 2016-12-28 NOTE — Care Management Note (Signed)
Case Management Note  Patient Details  Name: Carrie Monroe MRN: [id number] Date of Birth: 1935-10-29  Subjective/Objective:   From home with spouse,  s/p coronary stent intervention, will be on plavix.                   Action/Plan: NCM will follow for dc needs.   Expected Discharge Date:                  Expected Discharge Plan:  Home/Self Care  In-House Referral:     Discharge planning Services  CM Consult  Post Acute Care Choice:    Choice offered to:     DME Arranged:    DME Agency:     HH Arranged:    [place] Agency:     Status of Service:  Completed, signed off  If discussed at [employer] of Stay Meetings, dates discussed:    Additional Comments:  [name], RN 12/28/2016, 12:16 PM

## 2016-12-29 ENCOUNTER — Other Ambulatory Visit: Payer: Self-pay | Admitting: Physician Assistant

## 2016-12-29 ENCOUNTER — Encounter (HOSPITAL_COMMUNITY): Payer: Self-pay | Admitting: Physician Assistant

## 2016-12-29 DIAGNOSIS — I6529 Occlusion and stenosis of unspecified carotid artery: Secondary | ICD-10-CM | POA: Diagnosis present

## 2016-12-29 DIAGNOSIS — E785 Hyperlipidemia, unspecified: Secondary | ICD-10-CM | POA: Diagnosis not present

## 2016-12-29 DIAGNOSIS — I350 Nonrheumatic aortic (valve) stenosis: Secondary | ICD-10-CM | POA: Diagnosis not present

## 2016-12-29 DIAGNOSIS — E1122 Type 2 diabetes mellitus with diabetic chronic kidney disease: Secondary | ICD-10-CM | POA: Diagnosis not present

## 2016-12-29 DIAGNOSIS — I779 Disorder of arteries and arterioles, unspecified: Secondary | ICD-10-CM | POA: Diagnosis not present

## 2016-12-29 DIAGNOSIS — C50412 Malignant neoplasm of upper-outer quadrant of left female breast: Secondary | ICD-10-CM | POA: Diagnosis not present

## 2016-12-29 DIAGNOSIS — I200 Unstable angina: Secondary | ICD-10-CM | POA: Diagnosis not present

## 2016-12-29 DIAGNOSIS — I25110 Atherosclerotic heart disease of native coronary artery with unstable angina pectoris: Secondary | ICD-10-CM | POA: Diagnosis not present

## 2016-12-29 DIAGNOSIS — N189 Chronic kidney disease, unspecified: Secondary | ICD-10-CM

## 2016-12-29 DIAGNOSIS — I2510 Atherosclerotic heart disease of native coronary artery without angina pectoris: Secondary | ICD-10-CM | POA: Diagnosis present

## 2016-12-29 LAB — BASIC METABOLIC PANEL
Anion gap: 6 (ref 5–15)
BUN: 40 mg/dL — AB (ref 6–20)
CALCIUM: 9.2 mg/dL (ref 8.9–10.3)
CO2: 22 mmol/L (ref 22–32)
Chloride: 109 mmol/L (ref 101–111)
Creatinine, Ser: 1.51 mg/dL — ABNORMAL HIGH (ref 0.44–1.00)
GFR calc Af Amer: 37 mL/min — ABNORMAL LOW (ref >60)
GFR, EST NON AFRICAN AMERICAN: 32 mL/min — AB (ref >60)
GLUCOSE: 171 mg/dL — AB (ref 65–99)
POTASSIUM: 3.9 mmol/L (ref 3.5–5.1)
Sodium: 137 mmol/L (ref 135–145)

## 2016-12-29 LAB — CBC
HEMATOCRIT: 27.8 % — AB (ref 36.0–46.0)
Hemoglobin: 9 g/dL — ABNORMAL LOW (ref 12.0–15.0)
MCH: 29.8 pg (ref 26.0–34.0)
MCHC: 32.4 g/dL (ref 30.0–36.0)
MCV: 92.1 fL (ref 78.0–100.0)
Platelets: 129 10*3/uL — ABNORMAL LOW (ref 150–400)
RBC: 3.02 MIL/uL — ABNORMAL LOW (ref 3.87–5.11)
RDW: 15.8 % — AB (ref 11.5–15.5)
WBC: 6 10*3/uL (ref 4.0–10.5)

## 2016-12-29 LAB — GLUCOSE, CAPILLARY: Glucose-Capillary: 163 mg/dL — ABNORMAL HIGH (ref 65–99)

## 2016-12-29 MED ORDER — CLOPIDOGREL BISULFATE 75 MG PO TABS: 75.0000 mg | ORAL_TABLET | Freq: Every day | ORAL | 6 refills | Status: DC

## 2016-12-29 NOTE — Discharge Instructions (Signed)

## 2016-12-29 NOTE — Discharge Summary (Signed)
Discharge Summary    Patient ID: Carrie Monroe,  MRN: [id number], DOB/AGE: 10-31-1935 81 y.o.  Admit date: 12/28/2016 Discharge date: 12/29/2016  Primary Care Provider: Marton Redwood Primary Cardiologist: Dr. Sallyanne Kuster Dr. Angelena Form (TAVR)   Discharge Diagnoses    Principal Problem:   Unstable angina [facility]) Active Problems:   Breast cancer of upper-outer quadrant of left female breast ([place])   Dyslipidemia   Diabetes mellitus type 2 in obese ([place])   Chronic diastolic heart failure ([place])   Severe aortic stenosis   Gout   Essential hypertension   Morbid obesity due to excess calories ([place])   Chronic renal insufficiency, stage III (moderate)   Dental abscess- s/p multiple tooth extraction 12/21/16   Carotid artery stenosis   Coronary artery disease   Allergies No Known Allergies   History of Present Illness     SHARLISA Monroe is a 81 y.o. female with a history of known severe AS being worked up for TAVR, chronic diastolic CHF, HTN, HLD, obesity, DMT2, CKD stage III, breast cancer s/p surgery and XRT, carotid artery stenosis, and recently diagnosed multivessel CAD who presented to [facility] on 12/28/16 for planned PCI.   She had been followed for years by Dr. Mare Ferrari and most recently by Dr. Recardo Evangelist. She is known to have aortic stenosis for several years. Most recent echo 09/04/16 showed normal LV systolic function, severe aortic stenosis with calcified leaflets, restricted leaflet motion, mean gradient 46 mmHg, peak gradient 81 mmHg, AVA 0.72 cm2. She has been having worsened dyspnea with exertion for the past 6 months with mild LE edema. She was referred to Dr. Angelena Form for evaluation for TAVR.   She underwent pre operative L/RHC on 12/17/16 which showed severe multivessel CAD with with 90% proximal to mid LAD at the origin of a moderate sized diagonal that has 70% ostial stenosis, 99% mLCX stenosis, 80% mRCA stenosis and severe AS. She was volume overloaded at the time of  cath and admitted for diuresis. Dr. Cyndia Bent with [facility] was consulted on 12/18/16 who felt that she was at prohibitive risk for SAVR and CABG. He recommended TAVR and PCI and patient was in agreement. Of note, she also had an abscessed tooth and was seen by Dr Enrique Sack 12/19/16. She underwent multiple tooth extraction 12/21/16. During her workup, she underwent carotid duplex which revealed 80-99% stenosis of the right carotid artery. Dr. Oneida Alar with [facility] was consulted who recommended elective right carotid endarterectomy once she is over PCI and TAVR. She was discharged home on 12/22/16 with plans to return on 12/28/16 for PCI.    Hospital Course     Consultants: none   She underwent successful PCI/DES to [facility], PCI/DES to mLAD and PCTA of ostial diagonal on 12/28/16. She was placed on ASA and plavix. Creat remained stable ~ 1.5. The office will call her to arrange follow up BMET mid next week. She will proceed with staged CT scans for pre TAVR work up given renal insufficiency. She has a second surgical consult arranged with Dr. Roxy Manns on 01/16/17.    The patient has had an uncomplicated hospital course and is recovering well. The radial catheter site is stable. She has been seen by Dr. Recardo Evangelist today and deemed ready for discharge home. All follow-up appointments have been scheduled. Discharge medications are listed below.  _____________  Discharge Vitals Blood pressure (!) 134/46, pulse 64, temperature 97.8 F (36.6 C), temperature source Oral, resp. rate 14, height 5\' 3"  (1.6 m), weight 255  lb 6.4 oz (115.8 kg), SpO2 98 %.  Filed Weights   12/28/16 0812 12/29/16 0225  Weight: 251 lb (113.9 kg) 255 lb 6.4 oz (115.8 kg)    Labs & Radiologic Studies     CBC  Recent Labs  12/27/16 0831 12/29/16 0543  WBC 5.9 6.0  NEUTROABS 3.8  --   HGB 10.1* 9.0*  HCT 29.6* 27.8*  MCV 91 92.1  PLT 145* 496*   Basic Metabolic Panel  Recent Labs  12/27/16 0831 12/29/16 0543  NA 136 137  K 3.8 3.9  CL 102  109  CO2 24 22  GLUCOSE 178* 171*  BUN 45* 40*  CREATININE 1.46* 1.51*  CALCIUM 9.9 9.2   Liver Function Tests No results for input(s): AST, ALT, ALKPHOS, BILITOT, PROT, ALBUMIN in the last 72 hours. No results for input(s): LIPASE, AMYLASE in the last 72 hours. Cardiac Enzymes No results for input(s): CKTOTAL, CKMB, CKMBINDEX, TROPONINI in the last 72 hours. BNP Invalid input(s): POCBNP D-Dimer No results for input(s): DDIMER in the last 72 hours. Hemoglobin A1C No results for input(s): HGBA1C in the last 72 hours. Fasting Lipid Panel No results for input(s): CHOL, HDL, LDLCALC, TRIG, CHOLHDL, LDLDIRECT in the last 72 hours. Thyroid Function Tests No results for input(s): TSH, T4TOTAL, T3FREE, THYROIDAB in the last 72 hours.  Invalid input(s): FREET3  Ct Abdomen Pelvis Wo Contrast  Result Date: 12/18/2016 CLINICAL DATA:  81 year old female post cardiac catheterization with right groin hematoma. Low hemoglobin. Initial encounter. EXAM: CT ABDOMEN AND PELVIS WITHOUT CONTRAST TECHNIQUE: Multidetector CT imaging of the abdomen and pelvis was performed following the standard protocol without IV contrast. COMPARISON:  None. FINDINGS: Lower chest: Right middle lobe 3.1 mm nodule (series 4, image 9 and series 7, image 52). Mild cardiomegaly. Aortic and mitral valve calcifications. Trace coronary artery calcifications. Skin thickening left breast incompletely assessed. Correlation with clinical exam and mammography may be considered. Per medical chart patient has a history of breast cancer. Hepatobiliary: Slightly lobulated liver without other findings of cirrhosis. Two small calcifications. Taking into account limitation by non contrast imaging, no worrisome hepatic lesion. Postcholecystectomy. Pancreas: Taking into account limitation by non contrast imaging, no mass or inflammation. Spleen: Taking into account limitation by non contrast imaging, no primary splenic lesion. The splenic artery is  tortuous and calcified. Without contrast, cannot evaluate for the presence of an aneurysm. Prominent splenic renal collaterals along the inferior aspect of the spleen. Adrenals/Urinary Tract: No hydronephrosis. Bilateral renal cysts. Taking into account limitation by non contrast imaging, no worrisome renal or adrenal lesion. Contrast filled urinary bladder without gross abnormality. Stomach/Bowel: No primary bowel inflammatory process. Portions of stomach and colon under distended. Moderate stool throughout the colon. Mild rectal prolapse. Vascular/Lymphatic: Hematoma right femoral artery/ vein region predominantly superficial to upper thigh musculature and involving subcutaneous region. No extension along the psoas muscle. No retroperitoneal hematoma. Atherosclerotic changes aorta without aneurysm. Atherosclerotic changes iliac artery is and femoral arteries. Scattered normal size lymph nodes. Reproductive: No obvious abnormality. Other: No free peritoneal air. Diastases rectus muscles with postsurgical changes/ scar. No bowel containing hernia. Right inguinal fat containing hernia. Musculoskeletal: Scoliosis lumbar spine with superimposed degenerative changes. Schmorl's node deformity of the sacrum. No focal compression fracture. IMPRESSION: Hematoma right femoral artery/ vein region predominantly superficial to upper thigh musculature and involving subcutaneous region. No extension along the psoas muscle. No retroperitoneal hematoma. Splenic artery is tortuous and calcified. Without contrast, cannot evaluate for the presence of an aneurysm. Prominent splenic  renal collaterals along the inferior aspect of the spleen. Skin thickening left breast incompletely assessed. Correlation with clinical exam and mammography (if not recently performed) may be considered. Per medical chart patient has a history of breast cancer. Right middle lobe 3.1 mm nodule (series 4, image 9 and series 7, image 52). No follow-up needed if  patient is low-risk. Non-contrast chest CT can be considered in 12 months if patient is high-risk. This recommendation follows the consensus statement: Guidelines for Management of Incidental Pulmonary Nodules Detected on CT Images: From the Fleischner Society 2017; Radiology 2017; 284:228-243. Aortic Atherosclerosis (ICD10-I70.0). Electronically Signed   By: Genia Del M.D.   On: 12/18/2016 07:58   Dg Orthopantogram  Result Date: 12/19/2016 CLINICAL DATA:  Severe aortic stenosis.  Poor dentition EXAM: ORTHOPANTOGRAM/PANORAMIC COMPARISON:  None. FINDINGS: Atrophic changes in the body of the mandible bilaterally. No fracture or bone lesion. Bone loss around right lower pre molar. Lucency in the left upper premolar compatible with caries. Multiple fillings. IMPRESSION: Mandibular bone loss and caries. Electronically Signed   By: Franchot Gallo M.D.   On: 12/19/2016 09:03     Diagnostic Studies/Procedures    CATH 12/28/16 Procedures  Coronary Stent Intervention  Conclusion    A STENT SYNERGY DES 3X16 drug eluting stent was successfully placed.  Mid Cx lesion, 99 %stenosed.  Post intervention, there is a 0% residual stenosis.  A STENT SYNERGY DES 2.5X28 drug eluting stent was successfully placed.  Mid LAD lesion, 90 %stenosed.  Post intervention, there is a 0% residual stenosis.  Ost 2nd Diag lesion, 70 %stenosed.  Post intervention, there is a 60% residual stenosis.   1. Severe stenosis mid LAD at bifurcation of the Diagonal. Successful PTCA/DES x 1 mid LAD. Successful PTCA with balloon angioplasty only ostium Diagonal.  2. Severe stenosis mid Circumflex. Successful PTCA/DES x 1 mid to distal Circumflex.   Recommendations. Will continue ASA and Plavix for at least 3 months. Follow renal function and H/H in am. I will plan medical management of the moderate stenosis in the mid RCA. I will discuss f/u with Dr. Roxy Manns for second surgical consult. She will need staged CT scans in  preparation for TAVR based on renal function. She will have TAVR then will have carotid endarterectomy when all cardiac issues are stable in several months.      _____________  Carotid artery duplex 12/19/16 Summary: The right internal carotid artery exhibits elevated velocities suggestive of 80-99% stenosis. The left internal carotid artery exhibits elevated velocities suggestive of upper range 1-39% versus low range 40-59% stenosis. Vertebral arteries are patent with antegrade flow.  Disposition   Pt is being discharged home today in good condition.  Follow-up Plans & Appointments    Follow-up Information    Rexene Alberts, MD. Go on 01/16/2017.   Specialty:  Cardiothoracic Surgery Why:  @ 4pm Contact information: [place] [address] [phone]        [facility]. Go on 01/01/2017.   Why:  You will need lab work to check your kidneys. The office will call you to have this arranged.  Contact information: [place] [phone] [phone]           Discharge Medications     Medication List    STOP taking these medications   naproxen sodium 220 MG tablet Commonly known as:  ANAPROX     TAKE these medications   acetaminophen  325 MG tablet Commonly known as:  TYLENOL Take 2 tablets (650 mg total) by mouth every 6 (six) hours as needed for mild pain or headache.   allopurinol 300 MG tablet Commonly known as:  ZYLOPRIM Take 300 mg by mouth daily as needed (gout).   aspirin 81 MG chewable tablet Chew 1 tablet (81 mg total) by mouth daily.   clopidogrel 75 MG tablet Commonly known as:  PLAVIX Take 1 tablet (75 mg total) by mouth daily with breakfast.   feeding supplement (ENSURE ENLIVE) Liqd Take 237 mLs by mouth 3 (three) times daily between meals. What changed:  when to take this   furosemide 40 MG tablet Commonly known as:   LASIX Take 1 tablet (40 mg total) by mouth daily. Take half tablet by mouth daily as needed for swelling   hydrALAZINE 50 MG tablet Commonly known as:  APRESOLINE Take 0.5 tablets (25 mg total) by mouth every 8 (eight) hours.   HYDROcodone-acetaminophen 5-325 MG tablet Commonly known as:  NORCO/VICODIN Take 1-2 tablets by mouth every 6 (six) hours as needed for moderate pain or severe pain.   metoprolol tartrate 25 MG tablet Commonly known as:  LOPRESSOR Take 1 tablet (25 mg total) by mouth 2 (two) times daily.   rosuvastatin 10 MG tablet Commonly known as:  CRESTOR Take 10 mg by mouth daily.   tamoxifen 20 MG tablet Commonly known as:  NOLVADEX Take 1 tablet by mouth every morning.   Vitamin D 2000 units Caps Take 2,000 Units by mouth daily.          Outstanding Labs/Studies   BMET.   Duration of Discharge Encounter   Greater than 30 minutes including physician time.  Signed, Angelena Form PA-C 12/29/2016, 11:11 AM   I have examined the patient and reviewed assessment and plan and discussed with patient.  Agree with above as stated.  Please see my note from earlier today. S/p 2 vessel stenting.  TAVR planned and then carotid endarterectomy.  Plan for DAPT and secondary prevention.   Larae Grooms

## 2016-12-29 NOTE — Progress Notes (Addendum)
Progress Note  Patient Name: Carrie Monroe Date of Encounter: 12/29/2016  Primary Cardiologist: Drs. Croitoru/McAlhany  Subjective   No CP. Ambulating on her own.  Inpatient Medications    Scheduled Meds: . aspirin  81 mg Oral Daily  . cholecalciferol  2,000 Units Oral Daily  . clopidogrel  75 mg Oral Q breakfast  . furosemide  40 mg Oral Daily  . hydrALAZINE  25 mg Oral Q8H  . metoprolol tartrate  25 mg Oral BID  . rosuvastatin  10 mg Oral Daily  . sodium chloride flush  3 mL Intravenous Q12H  . tamoxifen  20 mg Oral q morning - 10a   Continuous Infusions: . sodium chloride     PRN Meds: sodium chloride, acetaminophen, allopurinol, HYDROcodone-acetaminophen, nitroGLYCERIN, ondansetron (ZOFRAN) IV, sodium chloride flush   Vital Signs    Vitals:   12/28/16 1931 12/28/16 2000 12/29/16 0225 12/29/16 0700  BP: (!) 141/43 (!) 170/99 (!) 150/43 (!) 134/46  Pulse: 73 66 69 64  Resp: 15 16 15 14   Temp: 98.1 F (36.7 C)  (!) 97.3 F (36.3 C) 97.8 F (36.6 C)  TempSrc: Oral  Oral Oral  SpO2: 95% 100% 98% 98%  Weight:   255 lb 6.4 oz (115.8 kg)   Height:        Intake/Output Summary (Last 24 hours) at 12/29/16 0934 Last data filed at 12/29/16 0755  Gross per 24 hour  Intake              480 ml  Output             4400 ml  Net            -3920 ml   Filed Weights   12/28/16 0812 12/29/16 0225  Weight: 251 lb (113.9 kg) 255 lb 6.4 oz (115.8 kg)    Telemetry    NSR - Personally Reviewed  ECG    NSR, no ST segment changes- Personally Reviewed  Physical Exam   GEN: No acute distress.   Neck: No JVD Cardiac: RRR, 2/6 murmurs, ; no rubs, or gallops. , tr edema Respiratory: Clear to auscultation bilaterally. GI: Soft, nontender, non-distended  MS: No edema; No deformity.  Right wrist stable.  No hematoma. Neuro:  Nonfocal  Psych: Normal affect   Labs    Chemistry Recent Labs Lab 12/27/16 0831 12/29/16 0543  NA 136 137  K 3.8 3.9  CL 102 109    CO2 24 22  GLUCOSE 178* 171*  BUN 45* 40*  CREATININE 1.46* 1.51*  CALCIUM 9.9 9.2  GFRNONAA 34* 32*  GFRAA 39* 37*  ANIONGAP  --  6     Hematology Recent Labs Lab 12/27/16 0831 12/29/16 0543  WBC 5.9 6.0  RBC 3.27* 3.02*  HGB 10.1* 9.0*  HCT 29.6* 27.8*  MCV 91 92.1  MCH 30.9 29.8  MCHC 34.1 32.4  RDW 15.4 15.8*  PLT 145* 129*    Cardiac EnzymesNo results for input(s): TROPONINI in the last 168 hours. No results for input(s): TROPIPOC in the last 168 hours.   BNPNo results for input(s): BNP, PROBNP in the last 168 hours.   DDimer No results for input(s): DDIMER in the last 168 hours.   Radiology    No results found.  Cardiac Studies   Cath report reviewed, 2 vessel PCI done  Patient Profile     81 y.o. female with CAD, AS, carotid disease  Assessment & Plan    1) CAD: 2  vessel.  Needs DAPT with aspirin and plavix.  Will need f/u [facility] at office visit.  Cr stable this AM.  Lower than prior readings from July.  COntinue aggressive secondary prevention.   2) TAVR w/u planned.  She has  avisit with the surgeon planned.    3) CEA after TAVR.    4) Resume home meds for HTN and hyperlipidemia.  Signed, Larae Grooms, MD  12/29/2016, 9:34 AM

## 2016-12-29 NOTE — Progress Notes (Addendum)
   PHASE I CARDIAC REHAB  [phone]  Pt ambulating in hallway independently.  Ambulation deferred. Education completed including risk factor modification, low fat-low cholesterol diet, exercise, and medication compliance.  Pt oriented to outpatient cardiac rehab.  Pt TAVR procedure pending.  Cardiac rehab referral will be deferred until TAVR performed.  Understanding verbalized

## 2016-12-31 ENCOUNTER — Ambulatory Visit (HOSPITAL_COMMUNITY): Payer: Self-pay | Admitting: Dentistry

## 2016-12-31 ENCOUNTER — Other Ambulatory Visit: Payer: Self-pay | Admitting: *Deleted

## 2016-12-31 VITALS — BP 141/41 | HR 66 | Temp 98.0°F

## 2016-12-31 DIAGNOSIS — N289 Disorder of kidney and ureter, unspecified: Secondary | ICD-10-CM

## 2016-12-31 DIAGNOSIS — I350 Nonrheumatic aortic (valve) stenosis: Secondary | ICD-10-CM

## 2016-12-31 DIAGNOSIS — K08199 Complete loss of teeth due to other specified cause, unspecified class: Secondary | ICD-10-CM

## 2016-12-31 DIAGNOSIS — Z01818 Encounter for other preprocedural examination: Secondary | ICD-10-CM

## 2016-12-31 DIAGNOSIS — M264 Malocclusion, unspecified: Secondary | ICD-10-CM

## 2016-12-31 DIAGNOSIS — K08409 Partial loss of teeth, unspecified cause, unspecified class: Secondary | ICD-10-CM

## 2016-12-31 MED ORDER — CHLORHEXIDINE GLUCONATE 0.12 % MT SOLN: OROMUCOSAL | 99 refills | Status: DC

## 2016-12-31 NOTE — Progress Notes (Signed)
POST OPERATIVE NOTE:  12/31/2016 [name] [id number]  VITALS: BP (!) 141/41 (BP Location: Right Arm)   Pulse 66   Temp 98 F (36.7 C) (Oral)   LABS:  Lab Results  Component Value Date   WBC 6.0 12/29/2016   HGB 9.0 (L) 12/29/2016   HCT 27.8 (L) 12/29/2016   MCV 92.1 12/29/2016   PLT 129 (L) 12/29/2016   BMET    Component Value Date/Time   NA 137 12/29/2016 0543   NA 136 12/27/2016 0831   NA 141 02/23/2015 1029   K 3.9 12/29/2016 0543   K 4.3 02/23/2015 1029   CL 109 12/29/2016 0543   CL 111 (H) 08/28/2012 1224   CO2 22 12/29/2016 0543   CO2 26 02/23/2015 1029   GLUCOSE 171 (H) 12/29/2016 0543   GLUCOSE 193 (H) 02/23/2015 1029   GLUCOSE 103 (H) 08/28/2012 1224   BUN 40 (H) 12/29/2016 0543   BUN 45 (H) 12/27/2016 0831   BUN 20.2 02/23/2015 1029   CREATININE 1.51 (H) 12/29/2016 0543   CREATININE 1.3 (H) 02/23/2015 1029   CALCIUM 9.2 12/29/2016 0543   CALCIUM 9.5 02/23/2015 1029   GFRNONAA 32 (L) 12/29/2016 0543   GFRAA 37 (L) 12/29/2016 0543    Lab Results  Component Value Date   INR 1.0 12/27/2016   INR 1.0 12/06/2016   INR 1.04 07/28/2013   No results found for: PTT   [name] is status post Multiple extractions with alveoloplasty and gross debridement of remaining dentition the operative room with general anesthesia on 12/21/2016. The patient then underwent angioplasty with stenting with Dr. Angelena Form on 12/28/2016.  Plavix therapy was started at that time. The patient now presents for evaluation of healing and suture removal as indicated.  SUBJECTIVE: The patient is without complaints of pain from dental extraction sites.  Patient indicates several sutures still remain. Patient is able to wear her maxillary partial denture without problems.  EXAM: There is no sign of infection, heme, or ooze. Sutures are loosely intact. Patient is healing in by a combination of primary closure and secondary intention. Maxillary partial denture is  stable and retentive but has a clasp in the area of #13 that should be evaluated for removal at this time due to the potential for trauma to soft tissues. Class was not adding any retention at this time.  PROCEDURE: The patient was given a chlorhexidine gluconate rinse for 30 seconds. Sutures were then removed without complication. Patient tolerated the procedure well. The maxillary partial denture that had the clasp associated with the area #13 removed. The partial denture was polish. Patient accepted the results of the denture adjustment.  ASSESSMENT: Post operative course is consistent with dental procedures performed in the operating room. Loss of teeth due to extraction.  PLAN: 1. Suggested start of chlorhexidine rinse. The patient is to rinse with 15 ML's twice daily after breakfast and at bedtime. Patient is to use in a swish and spit manner.  2. Use salt water rinses in between the chlorhexidine rinses as needed to aid healing. 3. Brush teeth after meals and at bedtime. 4. Patient is currently cleared for heart valve surgery. 5. Patient to call if problems arise. 6. Patient is to follow-up with her regular primary dentist once medically stable from the anticipated heart valve surgery to evaluate for additional extractions and subsequent fabrication of upper complete denture and lower partial denture. Dr. Cyndia Bent is to provide clearance for these additional dental procedures.  Pete Glatter.  Enrique Sack, DDS

## 2016-12-31 NOTE — Patient Instructions (Signed)
PLAN: 1. Suggested start of chlorhexidine rinse. The patient is to rinse with 15 ML's twice daily after breakfast and at bedtime. Patient is to use in a swish and spit manner.  2. Use salt water rinses in between the chlorhexidine rinses as needed to aid healing. 3. Brush teeth after meals and at bedtime. 4. Patient is currently cleared for heart valve surgery. 5. Patient to call if problems arise. 6. Patient is to follow-up with her regular primary dentist once medically stable from the anticipated heart valve surgery to evaluate for additional extractions and subsequent fabrication of upper complete denture and lower partial denture. Dr. Cyndia Bent is to provide clearance for these additional dental procedures.  Lenn Cal, DDS

## 2017-01-04 ENCOUNTER — Other Ambulatory Visit: Payer: Medicare Other | Admitting: *Deleted

## 2017-01-04 DIAGNOSIS — N189 Chronic kidney disease, unspecified: Secondary | ICD-10-CM

## 2017-01-04 LAB — BASIC METABOLIC PANEL
BUN / CREAT RATIO: 24 (ref 12–28)
BUN: 33 mg/dL — AB (ref 8–27)
CO2: 16 mmol/L — ABNORMAL LOW (ref 20–29)
CREATININE: 1.39 mg/dL — AB (ref 0.57–1.00)
Calcium: 9.2 mg/dL (ref 8.7–10.3)
Chloride: 105 mmol/L (ref 96–106)
GFR calc non Af Amer: 36 mL/min/{1.73_m2} — ABNORMAL LOW (ref >59)
GFR, EST AFRICAN AMERICAN: 42 mL/min/{1.73_m2} — AB (ref >59)
Glucose: 221 mg/dL — ABNORMAL HIGH (ref 65–99)
Potassium: 4.4 mmol/L (ref 3.5–5.2)
SODIUM: 140 mmol/L (ref 134–144)

## 2017-01-10 ENCOUNTER — Ambulatory Visit (HOSPITAL_COMMUNITY): Admit: 2017-01-10 | Payer: Medicare Other

## 2017-01-10 ENCOUNTER — Ambulatory Visit (HOSPITAL_COMMUNITY)
Admission: RE | Admit: 2017-01-10 | Discharge: 2017-01-10 | Disposition: A | Discharge status: Home / Self Care | Payer: Medicare Other | Source: Ambulatory Visit | Attending: Cardiovascular Disease | Admitting: Cardiovascular Disease

## 2017-01-10 ENCOUNTER — Ambulatory Visit (HOSPITAL_COMMUNITY): Payer: Medicare Other

## 2017-01-10 DIAGNOSIS — I517 Cardiomegaly: Secondary | ICD-10-CM | POA: Insufficient documentation

## 2017-01-10 DIAGNOSIS — R921 Mammographic calcification found on diagnostic imaging of breast: Secondary | ICD-10-CM | POA: Diagnosis not present

## 2017-01-10 DIAGNOSIS — R932 Abnormal findings on diagnostic imaging of liver and biliary tract: Secondary | ICD-10-CM | POA: Insufficient documentation

## 2017-01-10 DIAGNOSIS — R161 Splenomegaly, not elsewhere classified: Secondary | ICD-10-CM | POA: Diagnosis not present

## 2017-01-10 DIAGNOSIS — I358 Other nonrheumatic aortic valve disorders: Secondary | ICD-10-CM | POA: Insufficient documentation

## 2017-01-10 DIAGNOSIS — I350 Nonrheumatic aortic (valve) stenosis: Secondary | ICD-10-CM | POA: Diagnosis not present

## 2017-01-10 DIAGNOSIS — N289 Disorder of kidney and ureter, unspecified: Secondary | ICD-10-CM

## 2017-01-10 DIAGNOSIS — J811 Chronic pulmonary edema: Secondary | ICD-10-CM | POA: Insufficient documentation

## 2017-01-10 MED ORDER — SODIUM BICARBONATE BOLUS VIA INFUSION
INTRAVENOUS | Status: AC
  Administered 2017-01-10: 300 meq via INTRAVENOUS
  Filled 2017-01-10: qty 1

## 2017-01-10 MED ORDER — IOPAMIDOL (ISOVUE-370) INJECTION 76%
INTRAVENOUS | Status: AC
  Filled 2017-01-10: qty 100

## 2017-01-10 MED ORDER — SODIUM BICARBONATE 8.4 % IV SOLN
INTRAVENOUS | Status: AC
  Administered 2017-01-10: 08:00:00 via INTRAVENOUS
  Filled 2017-01-10: qty 500

## 2017-01-10 MED ORDER — IOPAMIDOL (ISOVUE-370) INJECTION 76%
INTRAVENOUS | Status: AC
  Administered 2017-01-10: 120 mL via INTRAVENOUS
  Filled 2017-01-10: qty 50

## 2017-01-16 ENCOUNTER — Encounter: Payer: Self-pay | Admitting: Thoracic Surgery (Cardiothoracic Vascular Surgery)

## 2017-01-16 ENCOUNTER — Institutional Professional Consult (permissible substitution) (INDEPENDENT_AMBULATORY_CARE_PROVIDER_SITE_OTHER): Payer: Medicare Other | Admitting: Thoracic Surgery (Cardiothoracic Vascular Surgery)

## 2017-01-16 VITALS — BP 170/70 | HR 73 | Resp 16 | Ht 63.0 in | Wt 241.0 lb

## 2017-01-16 DIAGNOSIS — I350 Nonrheumatic aortic (valve) stenosis: Secondary | ICD-10-CM

## 2017-01-16 DIAGNOSIS — I2510 Atherosclerotic heart disease of native coronary artery without angina pectoris: Secondary | ICD-10-CM

## 2017-01-16 NOTE — Patient Instructions (Signed)
Continue all previous medications without any changes at this time  

## 2017-01-16 NOTE — Progress Notes (Signed)
HE[facility] AND [name] [facility] CONSULTATION REPORT  Referring Provider is Burnell Blanks*, MD Primary Cardiologist is Croitoru, Dani Gobble, MD PCP is Marton Redwood, MD  Chief Complaint  Patient presents with  . Aortic Stenosis    2nd TAVR eval, review all studies    HPI:  Patient is a 81 year old obese white female with history of aortic stenosis, coronary artery disease, hypertension, stage III chronic kidney disease, type 2 diabetes mellitus with complications, hyperlipidemia, gout, and breast cancer status post lumpectomy with radiation therapy who has been referred for second surgical opinion to discuss treatment options for management of severe symptomatic aortic stenosis. The patient has been known to have a heart murmur for several years and has been followed for the last few years by Dr. Sallyanne Kuster for aortic stenosis. Over the past 6-8 months patient has developed progressive exertional fatigue and shortness of breath without any associated symptoms of chest tightness or chest pressure. Follow-up echocardiogram performed 09/04/2016 revealed severe aortic stenosis with normal left ventricular systolic function. She was referred to the multidisciplinary valve clinic and underwent diagnostic cardiac catheterization by Dr. Angelena Form 12/17/2016 which confirmed the presence of severe aortic stenosis and was notable for severe multivessel coronary artery disease. She was seen in consultation by Dr. Cyndia Bent and options were discussed including conventional surgical aortic valve replacement with coronary artery bypass grafting versus multivessel PCI and stenting with delayed transcatheter aortic valve replacement. CT angiography was performed to further evaluate the feasibility of transcatheter aortic valve replacement, and the patient underwent successful PCI and stenting of the left anterior descending coronary artery and the right  coronary artery on 12/28/2016 by Dr.McAlhany.  The patient returns to the office today for second surgical consultation.  The patient is married and lives with her husband in [place]. Her husband is reportedly quite ill and essentially an invalid. The patient serves as his primary care giver.  She has been retired for 12 years but now spends all of her days looking after her husband. She has remained reasonably active physically although she admits that she has never exercise much and she has been overweight for most of her adult life. Her activity and mobility is limited primarily by exertional shortness of breath which has progressed over the past 6-8 months. She now gets short of breath with moderate low level activity, such as just walking 50-75 feet and ordinary pace. She denies any history of resting shortness breath, PND, orthopnea, palpitations, dizzy spells, or syncope. She has never had any chest pain or chest tightness either with activity or at rest. She did not experience any improvement in her symptoms after she underwent PCI and stenting.   Past Medical History:  Diagnosis Date  . Aortic stenosis, severe   . Breast cancer ([place])    a. L breast cancer s/p lumpectomy and XRT  . Carotid artery stenosis    a. 11/2016: 80-99% RICA stenosis, 1-39% vs low range 28-36% LICA   . Chronic diastolic CHF (congestive heart failure) ([place])   . CKD (chronic kidney disease)   . Coronary artery disease    a. 11/2016: diagnosed with multivessel CAD, turned down for CABG and underwent PCI/DES to mLCx, PCI/DES to mLAD and PCTA of ostial diagonal on 12/28/16  . Diabetes mellitus    a. diet controlled   . Exogenous obesity   . Gout   . Hemorrhoids   . History of kidney stones   . Hyperlipidemia   .  Hypertension     Past Surgical History:  Procedure Laterality Date  . BREAST LUMPECTOMY  2009   left  . BREAST LUMPECTOMY WITH NEEDLE LOCALIZATION Left 01/06/2015   Procedure: BREAST BIOPSY WITH  NEEDLE LOCALIZATION AND SKIN BIOPSY;  Surgeon: Autumn Messing III, MD;  Location: [facility];  Service: General;  Laterality: Left;  . CATARACT EXTRACTION    . CHOLECYSTECTOMY  2010  . CORONARY STENT INTERVENTION N/A 12/28/2016   Procedure: Coronary Stent Intervention;  Surgeon: Burnell Blanks, MD;  Location: [facility];  Service: Cardiovascular;  Laterality: N/A;  . EYE SURGERY     BILATERAL CATARACT EXTRACTIONS AND LENS IMPLANTS  . KNEE SURGERY     right PARTIAL KNEE REPLACEMENT  . LEFT HEART CATH AND CORONARY ANGIOGRAPHY N/A 12/17/2016   Procedure: Left Heart Cath and Coronary Angiography;  Surgeon: Burnell Blanks, MD;  Location: [facility];  Service: Cardiovascular;  Laterality: N/A;  . MULTIPLE EXTRACTIONS WITH ALVEOLOPLASTY N/A 12/21/2016   Procedure: Extraction of tooth #'s 12, 23,24,25,26 and 29 with alveoloplasty and gross debridement of remaining teeth.;  Surgeon: Lenn Cal, DDS;  Location: [facility];  Service: Oral Surgery;  Laterality: N/A;  . SHOULDER SURGERY     right  . SKIN GRAFT TO RIGHT HAND     AFTER BURN TO THE HAND  . TOTAL KNEE ARTHROPLASTY Left 08/03/2013   Procedure: TOTAL LEFT KNEE ARTHROPLASTY;  Surgeon: Gearlean Alf, MD;  Location: [facility];  Service: Orthopedics;  Laterality: Left;  . US ECHOCARDIOGRAPHY  05/15/2010   EF 60-65%    Family History  Problem Relation Age of Onset  . Cancer Mother        pancreatic  . Heart disease Father   . Hypertension Father   . Cancer Brother   . Diabetes Sister     Social History   Social History  . Marital status: Married    Spouse name: N/A  . Number of children: 4  . Years of education: N/A   Occupational History  . 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 Smoking status: Never Smoker  . Smokeless tobacco: Never Used  . Alcohol use No  . Drug use: No  . Sexual activity: Not Currently   Other Topics Concern  . Not on file   Social  History Narrative   Epworth Sleepiness scale score =11 as of 01/25/16    Current Outpatient Prescriptions  Medication Sig Dispense Refill  . allopurinol (ZYLOPRIM) 300 MG tablet Take 300 mg by mouth daily as needed (gout).    Marland Kitchen aspirin 81 MG chewable tablet Chew 1 tablet (81 mg total) by mouth daily.    . chlorhexidine (PERIDEX) 0.12 % solution Rinse with 15 mls twice daily for 30 seconds. Use after breakfast and at bedtime. Spit out excess. Do not swallow. 480 mL prn  . Cholecalciferol (VITAMIN D) 2000 units CAPS Take 2,000 Units by mouth daily.    . clopidogrel (PLAVIX) 75 MG tablet Take 1 tablet (75 mg total) by mouth daily with breakfast. 30 tablet 6  . furosemide (LASIX) 40 MG tablet Take 1 tablet (40 mg total) by mouth daily. Take half tablet by mouth daily as needed for swelling 30 tablet 3  . hydrALAZINE (APRESOLINE) 50 MG tablet Take 0.5 tablets (25 mg total) by mouth every 8 (eight) hours. 100 tablet 3  . metoprolol tartrate (LOPRESSOR) 25 MG tablet Take 1 tablet (25 mg  total) by mouth 2 (two) times daily. 60 tablet 5  . rosuvastatin (CRESTOR) 10 MG tablet Take 10 mg by mouth daily.   0  . tamoxifen (NOLVADEX) 20 MG tablet Take 1 tablet by mouth every morning. 90 tablet 3   Current Facility-Administered Medications  Medication Dose Route Frequency Provider Last Rate Last Dose  . nitroGLYCERIN (NITROSTAT) SL tablet 0.4 mg  0.4 mg Sublingual Q5 min PRN Kilroy, Luke K, PA-C        No Known Allergies    Review of Systems:   General:  normal appetite, decreased energy, no weight gain, no weight loss, no fever  Cardiac:  no chest pain with exertion, no chest pain at rest, + SOB with exertion, no resting SOB, no PND, no orthopnea, no palpitations, no arrhythmia, no atrial fibrillation, + LE edema, no dizzy spells, no syncope  Respiratory:  + exertional shortness of breath, no home oxygen, no productive cough, no dry cough, no bronchitis, no wheezing, no hemoptysis, no asthma, no pain  with inspiration or cough, no sleep apnea, no CPAP at night  GI:   no difficulty swallowing, no reflux, no frequent heartburn, no hiatal hernia, no abdominal pain, no constipation, no diarrhea, no hematochezia, no hematemesis, no melena  GU:   no dysuria,  no frequency, no urinary tract infection, no hematuria, no kidney stones, + kidney disease  Vascular:  no pain suggestive of claudication, no pain in feet, no leg cramps, no varicose veins, no DVT, no non-healing foot ulcer  Neuro:   no stroke, no TIA's, no seizures, no headaches, no temporary blindness one eye,  no slurred speech, no peripheral neuropathy, no chronic pain, no instability of gait, no memory/cognitive dysfunction  Musculoskeletal: mild arthritis in hands, no joint swelling, no myalgias, no difficulty walking, normal mobility   Skin:   no rash, no itching, no skin infections, no pressure sores or ulcerations  Psych:   no anxiety, no depression, no nervousness, no unusual recent stress  Eyes:   no blurry vision, + floaters, no recent vision changes, no wears glasses or contacts  ENT:   no hearing loss, no loose or painful teeth, + partial dentures, last saw dentist during recent hospitalization  Hematologic:  no easy bruising, no abnormal bleeding, no clotting disorder, no frequent epistaxis  Endocrine:  + diabetes, doe check CBG's at home           Physical Exam:   BP (!) 170/70 (BP Location: Right Arm, Patient Position: Sitting, Cuff Size: Large)   Pulse 73   Resp 16   Ht 5\' 3"  (1.6 m)   Wt 241 lb (109.3 kg)   SpO2 93% Comment: RA  BMI 42.69 kg/m   General:  Obese female NAD    HEENT:  Unremarkable   Neck:   no JVD, no bruits, no adenopathy   Chest:   clear to auscultation, symmetrical breath sounds, no wheezes, no rhonchi   CV:   RRR, grade III/VI crescendo/decrescendo murmur heard best at RSB,  no diastolic murmur  Abdomen:  soft, non-tender, no masses   Extremities:  warm, well-perfused, pulses diminished, no LE  edema  Rectal/GU  Deferred  Neuro:   Grossly non-focal and symmetrical throughout  Skin:   Clean and dry, no rashes, no breakdown   Diagnostic Tests:  Transthoracic Echocardiography  Patient:    Carrie Monroe, Carrie Monroe MR #:       [id number] Study Date: 09/04/2016 Gender:     F Age:  80 Height:     162.6 cm Weight:     111.9 kg BSA:        2.31 m^2 Pt. Status: Room:   SONOGRAPHER  Cindy Hazy, [place] Croitoru, MD  [place] Croitoru, MD  ATTENDING    Lyman Bishop MD  PERFORMING   [facility], Outpatient  cc:  ------------------------------------------------------------------- LV EF: 60% -   65%  ------------------------------------------------------------------- Indications:      I35.9 Aortic Valve Disorder.  ------------------------------------------------------------------- History:   PMH:  Acquired from the patient and from the patient&'s chart.  PMH:  History of Breast Cancer. Chest pain. CHF. Murmur. Risk factors:  Hypertension. Diabetes mellitus. Obese. Dyslipidemia.  ------------------------------------------------------------------- Study Conclusions  - Left ventricle: The cavity size was normal. Wall thickness was   increased in a pattern of moderate LVH. Systolic function was   normal. The estimated ejection fraction was in the range of 60%   to 65%. Wall motion was normal; there were no regional wall   motion abnormalities. Doppler parameters are consistent with   pseudonormal left ventricular relaxation (grade 2 diastolic   dysfunction). LV filling pressure is elevated. - Aortic valve: Moderately calcified leaflets. Severe stenosis.   Mean gradient (S): 46 mm Hg. Peak gradient (S): 81 mm Hg. Valve   area (Vmax): 0.72 cm^2. - Mitral valve: Posterior calcified annulus. Mildly thickened   leaflets . - Left atrium: Moderately dilated. - Pulmonic valve: There was mild regurgitation. - Inferior vena cava: The vessel  was dilated. The respirophasic   diameter changes were blunted (< 50%), consistent with elevated   central venous pressure.  Impressions:  - Compared to a prior study in 2017, there is now severe aortic   valve stenosis. AVA around 0.7 cm2 - mean gradient of 46 mmHg.   LVEF is stable at 60-65%.  ------------------------------------------------------------------- Study data:   Study status:  Routine.  Procedure:  The patient reported no pain pre or post test. Transthoracic echocardiography for left ventricular function evaluation, for right ventricular function evaluation, and for assessment of valvular function. Image quality was adequate.  Study completion:  There were no complications.          Transthoracic echocardiography.  M-mode, complete 2D, spectral Doppler, and color Doppler.  Birthdate: Patient birthdate: 06-23-1935.  Age:  Patient is 81 yr old.  Sex: Gender: female.    BMI: 42.3 kg/m^2.  Blood pressure:     158/73 Patient status:  Outpatient.  Study date:  Study date: 09/04/2016. Study time: 09:37 AM.  Location:  [facility]  -------------------------------------------------------------------  ------------------------------------------------------------------- Left ventricle:  The cavity size was normal. Wall thickness was increased in a pattern of moderate LVH. Systolic function was normal. The estimated ejection fraction was in the range of 60% to 65%. Wall motion was normal; there were no regional wall motion abnormalities. Doppler parameters are consistent with pseudonormal left ventricular relaxation (grade 2 diastolic dysfunction). LV filling pressure is elevated.  ------------------------------------------------------------------- Aortic valve:  Moderately calcified leaflets. Severe stenosis. Doppler:     VTI ratio of LVOT to aortic valve: 0.3. Indexed valve area (VTI): 0.41 cm^2/m^2. Peak velocity ratio of LVOT to aortic valve: 0.23. Valve area  (Vmax): 0.72 cm^2. Indexed valve area (Vmax): 0.31 cm^2/m^2. Mean velocity ratio of LVOT to aortic valve: 0.3. Valve area (Vmean): 0.94 cm^2. Indexed valve area (Vmean): 0.41 cm^2/m^2.    Mean gradient (S): 46 mm Hg. Peak gradient (S): 81 mm Hg.  -------------------------------------------------------------------  Aorta:  Aortic root: The aortic root was normal in size. Ascending aorta: The ascending aorta was normal in size.  ------------------------------------------------------------------- Mitral valve:  Posterior calcified annulus. Mildly thickened leaflets .  Doppler:  There was trivial regurgitation.    Peak gradient (D): 5 mm Hg.  ------------------------------------------------------------------- Left atrium:  Moderately dilated.  ------------------------------------------------------------------- Atrial septum:  No defect or patent foramen ovale was identified.   ------------------------------------------------------------------- Pulmonic valve:    Doppler:  There was mild regurgitation.  ------------------------------------------------------------------- Tricuspid valve:   Doppler:  There was no significant regurgitation.  ------------------------------------------------------------------- Pulmonary artery:   The main pulmonary artery was normal-sized.  ------------------------------------------------------------------- Right atrium:  The atrium was at the upper limits of normal in size.  ------------------------------------------------------------------- Pericardium:  There was no pericardial effusion.  ------------------------------------------------------------------- Systemic veins: Inferior vena cava: The vessel was dilated. The respirophasic diameter changes were blunted (< 50%), consistent with elevated central venous pressure.  ------------------------------------------------------------------- Measurements   Left ventricle                             Value          Reference  LV ID, ED, PLAX chordal                   48.2  mm       43 - 52  LV ID, ES, PLAX chordal                   32.9  mm       23 - 38  LV fx shortening, PLAX chordal            32    %        >=29  LV PW thickness, ED                       13.7  mm       ---------  IVS/LV PW ratio, ED                       1.09           <=1.3  Stroke volume, 2D                         85    ml       ---------  Stroke volume/bsa, 2D                     37    ml/m^2   ---------  LV e&', lateral                            10.4  cm/s     ---------  LV E/e&', lateral                          10.58          ---------  LV e&', medial                             6.36  cm/s     ---------  LV E/e&', medial  17.3           ---------  LV e&', average                            8.38  cm/s     ---------  LV E/e&', average                          13.13          ---------    Ventricular septum                        Value          Reference  IVS thickness, ED                         15    mm       ---------    LVOT                                      Value          Reference  LVOT ID, S                                20    mm       ---------  LVOT area                                 3.14  cm^2     ---------  LVOT ID                                   20    mm       ---------  LVOT peak velocity, S                     103   cm/s     ---------  LVOT mean velocity, S                     81.5  cm/s     ---------  LVOT VTI, S                               27    cm       ---------  LVOT peak gradient, S                     4     mm Hg    ---------  Stroke volume (SV), LVOT DP               84.8  ml       ---------  Stroke index (SV/bsa), LVOT DP            36.7  ml/m^2   ---------    Aortic valve                              Value  Reference  Aortic valve peak velocity, S             449   cm/s     ---------  Aortic valve mean velocity, S             272    cm/s     ---------  Aortic valve VTI, S                       90.4  cm       ---------  Aortic mean gradient, S                   33    mm Hg    ---------  Aortic peak gradient, S                   81    mm Hg    ---------  VTI ratio, LVOT/AV                        0.3            ---------  Aortic valve area/bsa, VTI                0.41  cm^2/m^2 ---------  Velocity ratio, peak, LVOT/AV             0.23           ---------  Aortic valve area, peak velocity          0.72  cm^2     ---------  Aortic valve area/bsa, peak               0.31  cm^2/m^2 ---------  velocity  Velocity ratio, mean, LVOT/AV             0.3            ---------  Aortic valve area, mean velocity          0.94  cm^2     ---------  Aortic valve area/bsa, mean               0.41  cm^2/m^2 ---------  velocity    Aorta                                     Value          Reference  Aortic root ID, ED                        26    mm       ---------  Ascending aorta ID, A-P, S                35    mm       ---------    Left atrium                               Value          Reference  LA ID, A-P, ES                            52    mm       ---------  LA ID/bsa, A-P                    (  H)     2.25  cm/m^2   <=2.2  LA volume, S                              89    ml       ---------  LA volume/bsa, S                          38.6  ml/m^2   ---------  LA volume, ES, 1-p A4C                    89    ml       ---------  LA volume/bsa, ES, 1-p A4C                38.6  ml/m^2   ---------  LA volume, ES, 1-p A2C                    89    ml       ---------  LA volume/bsa, ES, 1-p A2C                38.6  ml/m^2   ---------    Mitral valve                              Value          Reference  Mitral E-wave peak velocity               110   cm/s     ---------  Mitral A-wave peak velocity               87.9  cm/s     ---------  Mitral deceleration time                  183   ms       150 - 230  Mitral peak gradient, D                    5     mm Hg    ---------  Mitral E/A ratio, peak                    1.3            ---------    Right ventricle                           Value          Reference  RV s&', lateral, S                         16.3  cm/s     ---------  Legend: (L)  and  (H)  mark values outside specified reference range.  ------------------------------------------------------------------- Prepared and Electronically Authenticated by  Lyman Bishop MD 2018-04-10T10:23:15    Left Heart Cath and Coronary Angiography  Conclusion     Prox Cx to Mid Cx lesion, 99 %stenosed.  Ost 2nd Diag to 2nd Diag lesion, 70 %stenosed.  Mid LAD to Dist LAD lesion, 90 %stenosed.  Ost RCA to Prox RCA lesion, 40 %stenosed.  Mid RCA lesion, 80 %stenosed.  There is severe aortic valve stenosis.   1. Severe triple vessel  CAD 2. Severe stenosis mid LAD involving a moderate caliber diagonal branch 3. Severe stenosis distal Circumflex artery 4. Severe stenosis mid RCA 5. Severe aortic valve stenosis (mean gradient 32 mmHg, peak to peak gradient 40 mmHg)  Recommendations: Will admit to telemetry today for diuresis. She is grossly volume overloaded on exam. I was unable to obtain access for a right heart catheterization today. I will begin diuresis with IV Lasix and follow renal function closely. I will ask CT surgery to see her today to discuss CABG and AVR but I do not think she is a good candidate for a complex surgical procedure. If she is not felt to be a surgical candidate, will plan PCI of the LAD, Circumflex and RCA later this week and then continue planning for TAVR. If we bring her back later this week for PCI, will plan right heart cath at that time from right antecubital approach which may be easier if we can unload volume.    Indications   Severe aortic valve stenosis [I35.0 (ICD-10-CM)]  Coronary artery disease involving native coronary artery of native heart with unstable angina pectoris (HCC)  [I25.110 (ICD-10-CM)]  Procedural Details/Technique   Technical Details Indication: Severe AS, dyspnea c/w unstable angina  Procedure: The risks, benefits, complications, treatment options, and expected outcomes were discussed with the patient. The patient and/or family concurred with the proposed plan, giving informed consent. The patient was brought to the cath lab after IV hydration was given. The patient was further sedated with Versed and Fentanyl. We were unable to obtain access in the right antecubital vein. The right groin was prepped and draped in the usual manner. I attempted to place a sheath in the right femoral vein but was unsuccessful. A 5 French sheath as placed in the right femoral artery. Standard diagnostic catheters were used to perform selective coronary angiography. I was able to cross the aortic valve with an AL-1 catheter and a straight wire. LV pressures measured. No LV gram. I was unable to obtain venous access. I could not visualize a left femoral vein with U/S.   There were no immediate complications. The patient was taken to the recovery area in stable condition.    Estimated blood loss <50 mL.  During this procedure the patient was administered the following to achieve and maintain moderate conscious sedation: Versed 2 mg, Fentanyl 50 mcg, while the patient's heart rate, blood pressure, and oxygen saturation were continuously monitored. The period of conscious sedation was 68 minutes, of which I was present face-to-face 100% of this time.    Complications   Complications documented before study signed (12/17/2016 8:57 AM EDT)    LEFT HEART CATH AND CORONARY ANGIOGRAPHY   None Documented by Burnell Blanks, MD 12/17/2016 8:57 AM EDT  Time Range: Intra-procedure      Coronary Findings   Dominance: Right  Left Anterior Descending  Vessel is large.  Mid LAD to Dist LAD lesion, 90% stenosed.  Second Diagonal Branch  Vessel is moderate in size.  Ost 2nd  Diag to 2nd Diag lesion, 70% stenosed.  Second Septal Branch  Vessel is small in size.  Third Diagonal Branch  Vessel is small in size.  Third Septal Branch  Vessel is small in size.  Ramus Intermedius  Vessel is moderate in size.  Left Circumflex  Vessel is large.  Prox Cx to Mid Cx lesion, 99% stenosed.  First Obtuse Marginal Branch  Vessel is small in size.  Second Obtuse Marginal Branch  Vessel  is small in size.  Third Obtuse Marginal Branch  Vessel is moderate in size.  Right Coronary Artery  Ost RCA to Prox RCA lesion, 40% stenosed.  Mid RCA lesion, 80% stenosed.  Left Heart   Aortic Valve There is severe aortic valve stenosis. The aortic valve is calcified.    Coronary Diagrams   Diagnostic Diagram       Implants     No implant documentation for this case.  PACS Images   Show images for Cardiac catheterization   Link to Procedure Log   Procedure Log    Hemo Data    Most Recent Value  Aortic Mean Gradient 40.2 mmHg  Aortic Peak Gradient 32 mmHg  AO Systolic Pressure 578 mmHg  AO Diastolic Pressure 65 mmHg  AO Mean 99 mmHg  LV Systolic Pressure 469 mmHg  LV Diastolic Pressure 10 mmHg  LV EDP 24 mmHg  Arterial Occlusion Pressure Extended Systolic Pressure 629 mmHg  Arterial Occlusion Pressure Extended Diastolic Pressure 68 mmHg  Arterial Occlusion Pressure Extended Mean Pressure 106 mmHg  Left Ventricular Apex Extended Systolic Pressure 528 mmHg  Left Ventricular Apex Extended Diastolic Pressure 7 mmHg  Left Ventricular Apex Extended EDP Pressure 27 mmHg  Order-Level Documents - 12/17/2016:   Scan on 12/23/2016 11:39 AM by Default, Provider, MD      Encounter-Level Documents - 12/17/2016:   Scan on 12/23/2016 11:43 AM by Default, Provider, MD  Scan on 12/23/2016 11:39 AM by Default, Provider, MD  Document on 12/22/2016 1:35 PM by Cathi Roan, RN : IP After Visit Summary  Scan on 12/22/2016 9:08 AM by Default, Provider, MD  Scan on 12/17/2016  11:44 AM by Default, Provider, MD  Scan on 12/17/2016 8:42 AM by Default, Provider, MD  Electronic signature on 12/17/2016 5:35 AM  Electronic signature on 12/17/2016 5:35 AM      Coronary Stent Intervention  Conclusion     A STENT SYNERGY DES 3X16 drug eluting stent was successfully placed.  Mid Cx lesion, 99 %stenosed.  Post intervention, there is a 0% residual stenosis.  A STENT SYNERGY DES 2.5X28 drug eluting stent was successfully placed.  Mid LAD lesion, 90 %stenosed.  Post intervention, there is a 0% residual stenosis.  Ost 2nd Diag lesion, 70 %stenosed.  Post intervention, there is a 60% residual stenosis.   1. Severe stenosis mid LAD at bifurcation of the Diagonal. Successful PTCA/DES x 1 mid LAD. Successful PTCA with balloon angioplasty only ostium Diagonal.  2. Severe stenosis mid Circumflex. Successful PTCA/DES x 1 mid to distal Circumflex.   Recommendations. Will continue ASA and Plavix for at least 3 months. Follow renal function and H/H in am. I will plan medical management of the moderate stenosis in the mid RCA. I will discuss f/u with Dr. Roxy Manns for second surgical consult. She will need staged CT scans in preparation for TAVR based on renal function. She will have TAVR then will have carotid endarterectomy when all cardiac issues are stable in several months.    Indications   Unstable angina (HCC) [I20.0 (ICD-10-CM)]  Procedural Details/Technique   Technical Details Indication: 81 yo female with critical carotid stenosis, severe double vessel CAD, severe AS, anemia, CKD here today for planned PCI.   Procedure: The risks, benefits, complications, treatment options, and expected outcomes were discussed with the patient. The patient and/or family concurred with the proposed plan, giving informed consent. The patient was brought to the cath lab after IV hydration was begun. The patient was further  sedated with Versed and Fentanyl. The right wrist was prepped and  draped in a sterile fashion. 1% lidocaine was used for local anesthesia. Using the modified Seldinger access technique, a 5 French sheath was placed in the right radial artery. 3 mg Verapamil was given through the sheath. 12,000 units IV heparin was given. IV heparin used for anti-coagulation. ACT over 300.   Lesion #1: (mid Circumflex): I engaged the left main with an EBU 3.5. Guiding catheter. I passed a Cougar IC wire down the circumflex artery. 2.5 x 12 mm balloon used to pre-dilate the stenosis in the mid vessel. A 3.0 16 mm Synergy DES was deployed in the mid Circumflex. The stent was post-dilated with a 3.25 x 12 mm [place] balloon x 1.   Lesion #2: (mid LAD/bifurcation Diagonal): Cougar IC wire down the LAD. Cougar IC wire down the Diagonal. I used a 2.0 x 20 mm balloon to pre-dilate the mid LAD. I then used a 2.5 x 12 mm balloon to pre-dilate the ostium of the diagonal branch. I then deployed a 2.5 x 28 mm Synergy DES in the mid LAD. The stent was post-dilated with a 2.75 x 12 mm [place] balloon x 2.   The sheath was removed from the right radial artery and a Terumo hemostasis band was applied at the arteriotomy site on the right wrist.     Estimated blood loss <50 mL.  During this procedure the patient was administered the following to achieve and maintain moderate conscious sedation: Versed 3 mg, Fentanyl 75 mcg, while the patient's heart rate, blood pressure, and oxygen saturation were continuously monitored. The period of conscious sedation was 83 minutes, of which I was present face-to-face 100% of this time.    Complications   Complications documented before study signed (12/28/2016 12:16 PM EDT)    CORONARY STENT INTERVENTION   None Documented by Burnell Blanks, MD 12/28/2016 12:16 PM EDT  Time Range: Intra-procedure      Coronary Findings   Dominance: Right  Left Anterior Descending  Mid LAD lesion, 90% stenosed.  Angioplasty: Pre-stent angioplasty was performed using a  BALLOON SAPPHIRE 2.0X20. A STENT SYNERGY DES 2.5X28 drug eluting stent was successfully placed. Stent strut is well apposed. Post-stent angioplasty was performed using a BALLOON SAPPHIRE [place] [id number]. The pre-interventional distal flow is normal (TIMI 3). The post-interventional distal flow is normal (TIMI 3). The intervention was successful . No complications occurred at this lesion.  There is no residual stenosis post intervention.  Second Diagonal [employer] 2nd Diag lesion, 70% stenosed.  Angioplasty: Angioplasty alone was performed using a BALLOON SAPPHIRE 2.5X12. The pre-interventional distal flow is normal (TIMI 3). The post-interventional distal flow is normal (TIMI 3). The intervention was successful . No complications occurred at this lesion.  There is a 60% residual stenosis post intervention.  Left Circumflex  Mid Cx lesion, 99% stenosed.  Angioplasty: Pre-stent angioplasty was performed using a BALLOON SAPPHIRE 2.5X12. A STENT SYNERGY DES 3X16 drug eluting stent was successfully placed. Stent strut is well apposed. Post-stent angioplasty was performed using a BALLOON SAPPHIRE [place] 3.25X12. The pre-interventional distal flow is normal (TIMI 3). The post-interventional distal flow is normal (TIMI 3). The intervention was successful . No complications occurred at this lesion.  There is no residual stenosis post intervention.  Coronary Diagrams   Diagnostic Diagram       Post-Intervention Diagram          Cardiac TAVR CT  TECHNIQUE: The patient was scanned  on a Philips 202 slice scanner . A 120 kV retrospective scan was triggered in the ascending thoracic aorta at 140 HU's. Gantry rotation speed was 250 msecs and collimation was .6 mm. No beta blockade or nitro were given. The 3D data set was reconstructed in 5% intervals of the R-R cycle. Systolic and diastolic phases were analyzed on a dedicated work station using MPR, MIP and VRT modes. The patient received 80 cc of  contrast.  FINDINGS: Aortic Valve: Tri leaflet and calcified with restricted leaflet motion  Aorta: Mild calcific atherosclerotic debris. No aneurysm normal arch vessel origin  Sinotubular Junction:  25.4 mm  Ascending Thoracic Aorta:  33 mm  Aortic Arch:  26 mm  Descending Thoracic Aorta:  26 mm  Sinus of Valsalva Measurements:  Non-coronary:  26 mm  Right -coronary:  24.5 mm  Left -coronary:  24.5 mm  Coronary Artery Height above Annulus:  Left Main:  11.4 mm above annulus  Right Coronary:  11.2 mm above annulus  Virtual Basal Annulus Measurements:  Maximum/Minimum Diameter:  21.8 mm x 25.8 mm  Perimeter:  75 mm  Area:  437 mm2  Coronary Arteries:  Sufficient height above annulus for deployment  Optimum Fluoroscopic Angle for Delivery: LAO 29 degrees Caudal 14 degrees  IMPRESSION: 1) Calcified tri leaflet aortic valve with annulus 437 mm2 suitable for a 26 mm Sapien 2 valve  2) Optimum angle for deployment LAO 29 degrees Caudal 14 degrees  3) Normal aortic root 33 mm  4) Coronary arteries sufficient height above annulus for deployment  Jenkins Rouge   Electronically Signed   By: Jenkins Rouge M.D.   On: 01/10/2017 12:22   CT ANGIOGRAPHY CHEST, ABDOMEN AND PELVIS  TECHNIQUE: Multidetector CT imaging through the chest, abdomen and pelvis was performed using the standard protocol during bolus administration of intravenous contrast. Multiplanar reconstructed images and MIPs were obtained and reviewed to evaluate the vascular anatomy.  CONTRAST:  120 mL of Isovue 370.  COMPARISON:  CT the abdomen and pelvis 12/18/2016. Chest CT 01/06/2008.  FINDINGS: CTA CHEST FINDINGS  Cardiovascular: Heart size is mildly enlarged. There is no significant pericardial fluid, thickening or pericardial calcification. There is aortic atherosclerosis, as well as atherosclerosis of the great vessels of the mediastinum and  the coronary arteries, including calcified atherosclerotic plaque in the left anterior descending, left circumflex and right coronary arteries. Severe calcifications of the aortic valve. Moderate calcifications of the mitral annulus.  Mediastinum/Lymph Nodes: No pathologically enlarged mediastinal or hilar lymph nodes. Esophagus is unremarkable in appearance. No axillary lymphadenopathy.  Lungs/Pleura: There is a background of patchy ground-glass attenuation scattered throughout both lungs with some associated mild interlobular septal thickening, the appearance of which is most suggestive of a background of mild interstitial pulmonary edema. No confluent consolidative airspace disease. No pleural effusions. 4 mm pulmonary nodule in the right middle lobe (axial image 41 of series 16), stable in retrospect compared to prior study from 2009, considered definitively benign. No other larger more suspicious appearing pulmonary nodules or masses are noted elsewhere in the lungs. Peripheral architectural distortion and subpleural reticulation in the left upper lobe deep to the left breast, presumably from prior radiation therapy.  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 There are no aggressive appearing lytic or blastic lesions noted in  the visualized portions of the skeleton.  CTA ABDOMEN AND PELVIS FINDINGS  Hepatobiliary: Liver has a slightly shrunken and nodular contour, suggesting underlying cirrhosis. No discrete cystic or solid hepatic lesions. Small calcified granuloma in the central aspect of the liver. No intra or extrahepatic biliary ductal dilatation. Status post cholecystectomy.  Pancreas: No  pancreatic mass. No pancreatic ductal dilatation. No pancreatic or peripancreatic fluid or inflammatory changes.  Spleen: Spleen is mildly enlarged measuring 13.5 x 4.9 x 13.4 cm (estimated splenic volume of 443 mL).  Adrenals/Urinary Tract: 1.8 cm low-attenuation lesion in the anterior aspect of the upper pole of the right kidney is compatible with a simple cyst. Several other subcentimeter low-attenuation lesions are noted in both kidneys, too small to definitively characterize, but statistically likely to represent tiny cysts. Bilateral adrenal glands are normal in appearance. No hydroureteronephrosis. Urinary bladder is normal in appearance.  Stomach/Bowel: The appearance of the stomach is normal. There is no pathologic dilatation of small bowel or colon. The appendix is not confidently identified and may be surgically absent. Regardless, there are no inflammatory changes noted adjacent to the cecum to suggest the presence of an acute appendicitis at this time.  Vascular/Lymphatic: Aortic atherosclerosis, with vascular findings and measurements pertinent to potential TAVR procedure, as detailed below. No aneurysm or dissection noted in the abdominal or pelvic vasculature. The celiac axis, superior mesenteric artery and inferior mesenteric artery and their major branches are all widely patent without hemodynamically significant stenosis. Two renal arteries are noted for both kidneys, all of which appear patent without hemodynamically significant stenosis. No lymphadenopathy identified in the abdomen or pelvis.  Reproductive: Uterus and ovaries are unremarkable in appearance.  Other: No significant volume of ascites.  No pneumoperitoneum.  Musculoskeletal: There are no aggressive appearing lytic or blastic lesions noted in the visualized portions of the skeleton.  VASCULAR MEASUREMENTS PERTINENT TO TAVR:  AORTA:  Minimal Aortic Diameter -  16 x 15 mm  Severity  of Aortic Calcification -  mild  RIGHT PELVIS:  Right Common Iliac Artery -  Minimal Diameter - 10.8 x 10.1 mm  Tortuosity - mild  Calcification - mild  Right External Iliac Artery -  Minimal Diameter - 7.1 x 7.8 mm  Tortuosity - moderate  Calcification - none  Right Common Femoral Artery -  Minimal Diameter - 8.2 x 7.8 mm  Tortuosity - mild  Calcification - none  LEFT PELVIS:  Left Common Iliac Artery -  Minimal Diameter - 11.1 x 9.5 mm  Tortuosity - mild  Calcification - mild  Left External Iliac Artery -  Minimal Diameter - 8.2 x 7.7 mm  Tortuosity - moderate  Calcification - none  Left Common Femoral Artery -  Minimal Diameter - 7.9 x 8.3 mm  Tortuosity - mild  Calcification - minimal  Review of the MIP images confirms the above findings.  IMPRESSION: 1. Vascular findings and measurements pertinent to potential TAVR procedure, as detailed above. This patient does appear to have suitable pelvic arterial access bilaterally. 2. Severe thickening calcification of the aortic valve, compatible with the reported clinical history of severe aortic stenosis. 3. Cardiomegaly with evidence of mild interstitial pulmonary edema, suggesting a background of mild congestive heart failure. 4. Postoperative changes and areas of architectural distortion in the left breast. The largest of these areas measures 4.9 x 3.7 cm and demonstrates some internal and peripheral calcifications. While this is favored to represent a chronic postoperative seroma, correlation with nonemergent mammography is recommended. 5. Morphologic changes  in the liver suggestive of underlying cirrhosis. There is also mild splenomegaly. 6. Additional incidental findings, as above. Aortic Atherosclerosis (ICD10-I70.0).   Electronically Signed   By: Vinnie Langton M.D.   On: 01/10/2017 13:23    STS Risk Calculator  Procedure    AVR + CABG  Risk of  Mortality   5.3% Morbidity or Mortality  30.9% Prolonged LOS   18.4% Short LOS    12.3% Permanent Stroke   3.0% Prolonged Vent Support  22.6% DSW Infection    0.9% Renal Failure    13.8% Reoperation    9.7%    Impression:  Patient has stage D severe symptomatic aortic stenosis and multivessel coronary artery disease. She presents with gradual progression of symptoms of exertional shortness of breath and fatigue consistent with chronic diastolic congestive heart failure, [place] Heart Association functional class III. I have personally reviewed the patient's recent echocardiogram, cardiac catheterizations, and CT angiograms. Transthoracic echocardiogram performed last April demonstrates presence of severe aortic stenosis with normal left ventricular systolic function. Peak velocity across the aortic valve measured 4.5 m/s corresponding to mean transvalvular gradient estimated 33 mmHg. The DVI was reported 0.30. All 3 leaflets of the patient's aortic valve were moderately thickened and calcified. 2 of the 3 leaflets were severely restricted, the third leaflet moves a little bit better than the other 2. Diagnostic cardiac catheterization confirmed the presence of severe aortic stenosis and also revealed significant multivessel coronary artery disease. She is diabetic and symptoms could be consistent with angina pectoris, although the patient reports that she has not experienced any significant improvement in her symptoms of exertional shortness of breath since she underwent PCI and stenting. Risks associated with conventional surgery would be at least moderately elevated because of the patient's age and comorbid medical problems.  Cardiac-gated CTA of the heart reveals anatomical characteristics consistent with aortic stenosis suitable for treatment by transcatheter aortic valve replacement without any significant complicating features and CTA of the aorta and iliac vessels demonstrate what appears to be  adequate pelvic vascular access to facilitate a transfemoral approach.    Plan:  The patient and her son-in-law were counseled at length regarding treatment alternatives for management of severe symptomatic aortic stenosis.  The natural history of aortic stenosis and long-term prognosis with medical therapy were discussed.  Alternative approaches such as conventional surgical aortic valve replacement, transcatheter aortic valve replacement, and palliative medical therapy were compared and contrasted at length. Expectations regarding the patient's postoperative convalescence after both conventional surgery and TAVR were discussed.  Issues specific to TAVR were discussed including questions regarding long term valve durability, potentially increased risk of need for permanent pacemaker placement, and the possibility of significant paravalvular leak.  This discussion was placed in the context of the patient's own specific clinical presentation and past medical history.  All of their questions been addressed.  The patient is interested in proceeding with transcatheter aortic valve replacement in the near future. She is reluctant to schedule a date at this time because she wants to discuss timing with other family members, but she hopes to proceed within the next month or so.  Following the decision to proceed with transcatheter aortic valve replacement, a discussion has been held regarding what types of management strategies would be attempted intraoperatively in the event of life-threatening complications, including whether or not the patient would be considered a candidate for the use of cardiopulmonary bypass and/or conversion to open sternotomy for attempted surgical intervention.  The patient has been  advised of a variety of complications that might develop including but not limited to risks of death, stroke, paravalvular leak, aortic dissection or other major vascular complications, aortic annulus rupture,  device embolization, cardiac rupture or perforation, mitral regurgitation, acute myocardial infarction, arrhythmia, heart block or bradycardia requiring permanent pacemaker placement, congestive heart failure, respiratory failure, renal failure, pneumonia, infection, other late complications related to structural valve deterioration or migration, or other complications that might ultimately cause a temporary or permanent loss of functional independence or other long term morbidity.  The patient provides full informed consent for the procedure as described and all questions were answered.  I spent in excess of 90 minutes during the conduct of this office consultation and >50% of this time involved direct face-to-face encounter with the patient for counseling and/or coordination of their care.    Valentina Gu. Roxy Manns, MD 01/16/2017 4:51 PM

## 2017-01-16 NOTE — Progress Notes (Signed)
Thanks, [employer]

## 2017-01-18 ENCOUNTER — Telehealth: Payer: Self-pay | Admitting: Cardiovascular Disease

## 2017-01-18 MED ORDER — HYDRALAZINE HCL 50 MG PO TABS: 50.0000 mg | ORAL_TABLET | Freq: Three times a day (TID) | ORAL | 3 refills | Status: DC

## 2017-01-18 NOTE — Telephone Encounter (Signed)
I spoke with pt who reports BP was 170/70 when she saw Dr Roxy Manns recently. She does not check BP at home. Hydralazine was decreased per phone note on 7/31 due to pt feeling weak.  Pt is asking if she should increase hydralazine back to whole tablet (50 mg) due to recent BP reading.  She states weakness has improved and thinks it was related to just getting out of the hospital.

## 2017-01-18 NOTE — Telephone Encounter (Signed)
Patient calling, states that she has some questions about the medication that she is taking.

## 2017-01-18 NOTE — Telephone Encounter (Signed)
I reviewed with Dr. Irish Lack (DOD) and OK for pt to increase hydralazine back to 50 mg three times daily. Pt notified.

## 2017-01-21 ENCOUNTER — Other Ambulatory Visit: Payer: Self-pay | Admitting: *Deleted

## 2017-01-21 DIAGNOSIS — I350 Nonrheumatic aortic (valve) stenosis: Secondary | ICD-10-CM

## 2017-01-21 MED FILL — Sodium Bicarbonate IV Soln 8.4%: INTRAVENOUS | Qty: 75 | Status: AC

## 2017-01-21 MED FILL — Dextrose Inj 5%: INTRAVENOUS | Qty: 500 | Status: AC

## 2017-01-26 DIAGNOSIS — Z9289 Personal history of other medical treatment: Secondary | ICD-10-CM

## 2017-01-26 HISTORY — DX: Personal history of other medical treatment: Z92.89

## 2017-02-01 ENCOUNTER — Encounter (HOSPITAL_COMMUNITY): Payer: Self-pay

## 2017-02-01 ENCOUNTER — Encounter (HOSPITAL_COMMUNITY)
Admission: RE | Admit: 2017-02-01 | Discharge: 2017-02-01 | Disposition: A | Discharge status: Home / Self Care | Payer: Medicare Other | Source: Ambulatory Visit | Attending: Cardiovascular Disease | Admitting: Cardiovascular Disease

## 2017-02-01 ENCOUNTER — Ambulatory Visit (HOSPITAL_COMMUNITY)
Admission: RE | Admit: 2017-02-01 | Discharge: 2017-02-01 | Disposition: A | Discharge status: Home / Self Care | Payer: Medicare Other | Source: Ambulatory Visit | Attending: Cardiovascular Disease | Admitting: Cardiovascular Disease

## 2017-02-01 DIAGNOSIS — Z01812 Encounter for preprocedural laboratory examination: Secondary | ICD-10-CM | POA: Insufficient documentation

## 2017-02-01 DIAGNOSIS — I350 Nonrheumatic aortic (valve) stenosis: Secondary | ICD-10-CM

## 2017-02-01 DIAGNOSIS — Z01810 Encounter for preprocedural cardiovascular examination: Secondary | ICD-10-CM | POA: Insufficient documentation

## 2017-02-01 LAB — CBC
HCT: 31.1 % — ABNORMAL LOW (ref 36.0–46.0)
Hemoglobin: 10 g/dL — ABNORMAL LOW (ref 12.0–15.0)
MCH: 30.7 pg (ref 26.0–34.0)
MCHC: 32.2 g/dL (ref 30.0–36.0)
MCV: 95.4 fL (ref 78.0–100.0)
PLATELETS: 142 10*3/uL — AB (ref 150–400)
RBC: 3.26 MIL/uL — ABNORMAL LOW (ref 3.87–5.11)
RDW: 14.7 % (ref 11.5–15.5)
WBC: 7 10*3/uL (ref 4.0–10.5)

## 2017-02-01 LAB — BLOOD GAS, ARTERIAL
ACID-BASE DEFICIT: 0.4 mmol/L (ref 0.0–2.0)
Bicarbonate: 23.2 mmol/L (ref 20.0–28.0)
Drawn by: 449841
FIO2: 0.21
O2 Saturation: 98.8 %
PATIENT TEMPERATURE: 98.6
PO2 ART: 122 mmHg — AB (ref 83.0–108.0)
pCO2 arterial: 34.2 mmHg (ref 32.0–48.0)
pH, Arterial: 7.446 (ref 7.350–7.450)

## 2017-02-01 LAB — COMPREHENSIVE METABOLIC PANEL
ALT: 15 U/L (ref 14–54)
ANION GAP: 10 (ref 5–15)
AST: 21 U/L (ref 15–41)
Albumin: 3.6 g/dL (ref 3.5–5.0)
Alkaline Phosphatase: 59 U/L (ref 38–126)
BUN: 38 mg/dL — ABNORMAL HIGH (ref 6–20)
CALCIUM: 9.6 mg/dL (ref 8.9–10.3)
CHLORIDE: 108 mmol/L (ref 101–111)
CO2: 21 mmol/L — AB (ref 22–32)
Creatinine, Ser: 1.41 mg/dL — ABNORMAL HIGH (ref 0.44–1.00)
GFR calc non Af Amer: 35 mL/min — ABNORMAL LOW (ref >60)
GFR, EST AFRICAN AMERICAN: 40 mL/min — AB (ref >60)
Glucose, Bld: 165 mg/dL — ABNORMAL HIGH (ref 65–99)
Potassium: 4 mmol/L (ref 3.5–5.1)
SODIUM: 139 mmol/L (ref 135–145)
Total Bilirubin: 0.7 mg/dL (ref 0.3–1.2)
Total Protein: 6.8 g/dL (ref 6.5–8.1)

## 2017-02-01 LAB — APTT: APTT: 22 s — AB (ref 24–36)

## 2017-02-01 LAB — PROTIME-INR
INR: 1.06
PROTHROMBIN TIME: 13.7 s (ref 11.4–15.2)

## 2017-02-01 LAB — GLUCOSE, CAPILLARY: GLUCOSE-CAPILLARY: 171 mg/dL — AB (ref 65–99)

## 2017-02-01 LAB — SURGICAL PCR SCREEN
MRSA, PCR: NEGATIVE
STAPHYLOCOCCUS AUREUS: NEGATIVE

## 2017-02-01 LAB — URINALYSIS, ROUTINE W REFLEX MICROSCOPIC
BILIRUBIN URINE: NEGATIVE
Glucose, UA: NEGATIVE mg/dL
HGB URINE DIPSTICK: NEGATIVE
KETONES UR: NEGATIVE mg/dL
Leukocytes, UA: NEGATIVE
NITRITE: NEGATIVE
PH: 6 (ref 5.0–8.0)
Protein, ur: NEGATIVE mg/dL
Specific Gravity, Urine: 1.013 (ref 1.005–1.030)

## 2017-02-01 LAB — TYPE AND SCREEN
ABO/RH(D): A POS
ANTIBODY SCREEN: NEGATIVE

## 2017-02-01 LAB — HEMOGLOBIN A1C
Hgb A1c MFr Bld: 5.8 % — ABNORMAL HIGH (ref 4.8–5.6)
MEAN PLASMA GLUCOSE: 119.76 mg/dL

## 2017-02-01 NOTE — Pre-Procedure Instructions (Addendum)
Carrie Monroe  02/01/2017      [facility] - [place] - [address] [place] [address] Phone: [phone] Fax: [phone]  [facility] [place], [place] [address] Phone: [phone] Fax: [phone]  [facility] [place], [place] - [place] [address] [place] [address] Phone: [phone] Fax: [phone]  [place], [place] [address] [place] [address] Phone: [phone] Fax: [phone]    Your procedure is scheduled on Tues. Sept. 11  Report to [facility] Admitting at 5:30A.M.  Call this number if you have problems the morning of surgery:  [phone]   Remember:  Do not eat food or drink liquids after midnight on Mon. Sept. 10   Take these medicines the morning of surgery with A SIP OF WATER : hydralazine (apresoline), tamoxifen (nolvadex)              Stop: advil, motrin, aleve, , ibuprofen, BC Powders, Goody's, vitamins and herbal medicines: fish oil              Stop aspirin and plavix per Dr. Roxy Manns and Dr. Angelena Form     How to Manage Your Diabetes Before and After Surgery  Why is it important to control my blood sugar before and after surgery? . Improving blood sugar levels before and after surgery helps healing and can limit problems. . A way of improving blood sugar control is eating a healthy diet by: o  Eating less sugar and carbohydrates o  Increasing activity/exercise o  Talking with your doctor about reaching your blood sugar goals . High blood sugars (greater than 180 mg/dL) can raise your risk of infections and slow your recovery, so you will need to focus on controlling your diabetes during the weeks before surgery. . Make sure that the doctor who takes care of your  diabetes knows about your planned surgery including the date and location.  How do I manage my blood sugar before surgery? . Check your blood sugar at least 4 times a day, starting 2 days before surgery, to make sure that the level is not too high or low. o Check your blood sugar the morning of your surgery when you wake up and every 2 hours until you get to the Short Stay unit. . If your blood sugar is less than 70 mg/dL, you will need to treat for low blood sugar: o Do not take insulin. o Treat a low blood sugar (less than 70 mg/dL) with  cup of clear juice (cranberry or apple), 4 glucose tablets, OR glucose gel. o Recheck blood sugar in 15 minutes after treatment (to make sure it is greater than 70 mg/dL). If your blood sugar is not greater than 70 mg/dL on recheck, call [phone] for further instructions. . Report your blood sugar to the short stay nurse when you get to Short Stay.  . If you are admitted to the hospital after surgery: o Your blood sugar will be checked by the staff and you will probably be given insulin after surgery (instead of oral diabetes medicines) to make sure you have good blood sugar levels. o The goal for blood sugar control after surgery is 80-180 mg/dL.     WHAT DO I DO ABOUT MY DIABETES MEDICATION?   Marland Kitchen Do not  take oral diabetes medicines (pills) the morning of surgery.     Do not wear jewelry, make-up or nail polish.  Do not wear lotions, powders, or perfumes, or deoderant.  Do not shave 48 hours prior to surgery.  Men may shave face and neck.  Do not bring valuables to the hospital.  [facility] is not responsible for any belongings or valuables.  Contacts, dentures or bridgework may not be worn into surgery.  Leave your suitcase in the car.  After surgery it may be brought to your room.  For patients admitted to the hospital, discharge time will be determined by your treatment team.  Patients discharged the day of surgery will not be allowed to  drive home.    Special instructions:   [place]- Preparing For Surgery  Before surgery, you can play an important role. Because skin is not sterile, your skin needs to be as free of germs as possible. You can reduce the number of germs on your skin by washing with CHG (chlorahexidine gluconate) Soap before surgery.  CHG is an antiseptic cleaner which kills germs and bonds with the skin to continue killing germs even after washing.  Please do not use if you have an allergy to CHG or antibacterial soaps. If your skin becomes reddened/irritated stop using the CHG.  Do not shave (including legs and underarms) for at least 48 hours prior to first CHG shower. It is OK to shave your face.  Please follow these instructions carefully.   1. Shower the NIGHT BEFORE SURGERY and the MORNING OF SURGERY with CHG.   2. If you chose to wash your hair, wash your hair first as usual with your normal shampoo.  3. After you shampoo, rinse your hair and body thoroughly to remove the shampoo.  4. Use CHG as you would any other liquid soap. You can apply CHG directly to the skin and wash gently with a scrungie or a clean washcloth.   5. Apply the CHG Soap to your body ONLY FROM THE NECK DOWN.  Do not use on open wounds or open sores. Avoid contact with your eyes, ears, mouth and genitals (private parts). Wash genitals (private parts) with your normal soap.  6. Wash thoroughly, paying special attention to the area where your surgery will be performed.  7. Thoroughly rinse your body with warm water from the neck down.  8. DO NOT shower/wash with your normal soap after using and rinsing off the CHG Soap.  9. Pat yourself dry with a CLEAN TOWEL.   10. Wear CLEAN PAJAMAS   11. Place CLEAN SHEETS on your bed the night of your first shower and DO NOT SLEEP WITH PETS.    Day of Surgery: Do not apply any deodorants/lotions. Please wear clean clothes to the hospital/surgery center.      Please read over  the following fact sheets that you were given. Coughing and Deep Breathing, MRSA Information and Surgical Site Infection Prevention

## 2017-02-01 NOTE — Progress Notes (Addendum)
PCP:Dr. Johny Sax @ [place] [place]  Pt. Reported she usually doesn't check sugars, it's been awhile,last fasting sugar 156  Left message for [name] @ Dr. [name] office if pt. Needs to stop aspirin and also plavix.Pt. Reported she had a instruction sheet to continue plavix but wasn't sure to take it day of surgery. Instructed pt. If she hasn't heard from [facility] by early afternoon  For her to call the office. Pt. Verbalized understanding.  [name] returned call, stated pt. To continue aspirin and plavix but not to take it day of surgery.

## 2017-02-04 MED ORDER — VANCOMYCIN HCL 10 G IV SOLR
1250.0000 mg | INTRAVENOUS | Status: DC
  Filled 2017-02-04: qty 1250

## 2017-02-04 MED ORDER — DOPAMINE-DEXTROSE 3.2-5 MG/ML-% IV SOLN
0.0000 ug/kg/min | INTRAVENOUS | Status: DC
  Administered 2017-02-05: 5 ug/kg/min via INTRAVENOUS
  Filled 2017-02-04: qty 250

## 2017-02-04 MED ORDER — MAGNESIUM SULFATE 50 % IJ SOLN
40.0000 meq | INTRAMUSCULAR | Status: DC
  Filled 2017-02-04: qty 10

## 2017-02-04 MED ORDER — DEXTROSE 5 % IV SOLN
1.5000 g | INTRAVENOUS | Status: AC
  Administered 2017-02-05: 1.5 g via INTRAVENOUS
  Filled 2017-02-04 (×2): qty 1.5

## 2017-02-04 MED ORDER — DEXMEDETOMIDINE HCL IN NACL 400 MCG/100ML IV SOLN
0.1000 ug/kg/h | INTRAVENOUS | Status: AC
  Administered 2017-02-05: 1.5 ug/kg/h via INTRAVENOUS
  Filled 2017-02-04: qty 100

## 2017-02-04 MED ORDER — SODIUM CHLORIDE 0.9 % IV SOLN
30.0000 ug/min | INTRAVENOUS | Status: DC
  Filled 2017-02-04: qty 2

## 2017-02-04 MED ORDER — NITROGLYCERIN IN D5W 200-5 MCG/ML-% IV SOLN
2.0000 ug/min | INTRAVENOUS | Status: DC
  Filled 2017-02-04: qty 250

## 2017-02-04 MED ORDER — NOREPINEPHRINE BITARTRATE 1 MG/ML IV SOLN
0.0000 ug/min | INTRAVENOUS | Status: DC
  Filled 2017-02-04: qty 4

## 2017-02-04 MED ORDER — DEXTROSE 5 % IV SOLN
0.0000 ug/min | INTRAVENOUS | Status: DC
  Filled 2017-02-04: qty 4

## 2017-02-04 MED ORDER — POTASSIUM CHLORIDE 2 MEQ/ML IV SOLN
80.0000 meq | INTRAVENOUS | Status: DC
  Filled 2017-02-04: qty 40

## 2017-02-04 MED ORDER — HEPARIN SODIUM (PORCINE) 1000 UNIT/ML IJ SOLN
INTRAMUSCULAR | Status: DC
  Filled 2017-02-04: qty 30

## 2017-02-04 MED ORDER — SODIUM CHLORIDE 0.9 % IV SOLN
INTRAVENOUS | Status: DC
  Filled 2017-02-04: qty 1

## 2017-02-04 NOTE — Anesthesia Preprocedure Evaluation (Addendum)
Anesthesia Evaluation  Patient identified by MRN, date of birth, ID band Patient awake    Reviewed: Allergy & Precautions, H&P , NPO status , Patient's Chart, lab work & pertinent test results  Airway Mallampati: III  TM Distance: >3 FB Neck ROM: Full    Dental no notable dental hx. (+) Poor Dentition, Dental Advisory Given   Pulmonary neg pulmonary ROS,    Pulmonary exam normal breath sounds clear to auscultation       Cardiovascular Exercise Tolerance: Good hypertension, Pt. on medications and Pt. on home beta blockers + angina + CAD, + Cardiac Stents and +CHF  + Valvular Problems/Murmurs AS  Rhythm:Regular Rate:Normal + Systolic murmurs    Neuro/Psych negative neurological ROS  negative psych ROS   GI/Hepatic negative GI ROS, Neg liver ROS,   Endo/Other  diabetesMorbid obesity  Renal/GU Renal InsufficiencyRenal disease  negative genitourinary   Musculoskeletal  (+) Arthritis ,   Abdominal   Peds  Hematology negative hematology ROS (+)   Anesthesia Other Findings   Reproductive/Obstetrics negative OB ROS                           Anesthesia Physical Anesthesia Plan  ASA: IV  Anesthesia Plan: MAC   Post-op Pain Management:    Induction: Intravenous  PONV Risk Score and Plan: 3 and Ondansetron, Midazolam, Treatment may vary due to age or medical condition and Propofol infusion  Airway Management Planned: Simple Face Mask  Additional Equipment: Arterial line, CVP and Ultrasound Guidance Line Placement  Intra-op Plan:   Post-operative Plan:   Informed Consent: I have reviewed the patients History and Physical, chart, labs and discussed the procedure including the risks, benefits and alternatives for the proposed anesthesia with the patient or authorized representative who has indicated his/her understanding and acceptance.   Dental advisory given  Plan Discussed with: CRNA  and Surgeon  Anesthesia Plan Comments:        Anesthesia Quick Evaluation

## 2017-02-05 ENCOUNTER — Inpatient Hospital Stay (HOSPITAL_COMMUNITY): Payer: Medicare Other

## 2017-02-05 ENCOUNTER — Encounter (HOSPITAL_COMMUNITY)
Admission: RE | Disposition: A | Discharge status: Skilled Nursing Facility | Payer: Self-pay | Source: Ambulatory Visit | Attending: Cardiovascular Disease

## 2017-02-05 ENCOUNTER — Inpatient Hospital Stay (HOSPITAL_COMMUNITY)
Admission: RE | Admit: 2017-02-05 | Discharge: 2017-02-27 | DRG: 266 | Disposition: A | Discharge status: Skilled Nursing Facility | Payer: Medicare Other | Source: Ambulatory Visit | Attending: Cardiovascular Disease | Admitting: Cardiovascular Disease

## 2017-02-05 ENCOUNTER — Inpatient Hospital Stay (HOSPITAL_COMMUNITY): Payer: Medicare Other | Admitting: Certified Registered Nurse Anesthetist

## 2017-02-05 ENCOUNTER — Inpatient Hospital Stay (HOSPITAL_COMMUNITY): Payer: Medicare Other | Admitting: Certified Registered"

## 2017-02-05 ENCOUNTER — Encounter (HOSPITAL_COMMUNITY): Payer: Self-pay | Admitting: *Deleted

## 2017-02-05 DIAGNOSIS — Z923 Personal history of irradiation: Secondary | ICD-10-CM

## 2017-02-05 DIAGNOSIS — Z7902 Long term (current) use of antithrombotics/antiplatelets: Secondary | ICD-10-CM

## 2017-02-05 DIAGNOSIS — Z955 Presence of coronary angioplasty implant and graft: Secondary | ICD-10-CM

## 2017-02-05 DIAGNOSIS — S301XXA Contusion of abdominal wall, initial encounter: Secondary | ICD-10-CM | POA: Diagnosis not present

## 2017-02-05 DIAGNOSIS — D6959 Other secondary thrombocytopenia: Secondary | ICD-10-CM | POA: Diagnosis not present

## 2017-02-05 DIAGNOSIS — Z953 Presence of xenogenic heart valve: Secondary | ICD-10-CM | POA: Diagnosis not present

## 2017-02-05 DIAGNOSIS — R578 Other shock: Secondary | ICD-10-CM | POA: Diagnosis not present

## 2017-02-05 DIAGNOSIS — D62 Acute posthemorrhagic anemia: Secondary | ICD-10-CM | POA: Diagnosis not present

## 2017-02-05 DIAGNOSIS — E1122 Type 2 diabetes mellitus with diabetic chronic kidney disease: Secondary | ICD-10-CM | POA: Diagnosis present

## 2017-02-05 DIAGNOSIS — Z23 Encounter for immunization: Secondary | ICD-10-CM | POA: Diagnosis not present

## 2017-02-05 DIAGNOSIS — C50412 Malignant neoplasm of upper-outer quadrant of left female breast: Secondary | ICD-10-CM | POA: Diagnosis present

## 2017-02-05 DIAGNOSIS — E119 Type 2 diabetes mellitus without complications: Secondary | ICD-10-CM | POA: Diagnosis present

## 2017-02-05 DIAGNOSIS — Z87442 Personal history of urinary calculi: Secondary | ICD-10-CM

## 2017-02-05 DIAGNOSIS — I447 Left bundle-branch block, unspecified: Secondary | ICD-10-CM | POA: Diagnosis not present

## 2017-02-05 DIAGNOSIS — J9811 Atelectasis: Secondary | ICD-10-CM

## 2017-02-05 DIAGNOSIS — N200 Calculus of kidney: Secondary | ICD-10-CM | POA: Diagnosis present

## 2017-02-05 DIAGNOSIS — J9601 Acute respiratory failure with hypoxia: Secondary | ICD-10-CM | POA: Diagnosis not present

## 2017-02-05 DIAGNOSIS — Z0189 Encounter for other specified special examinations: Secondary | ICD-10-CM

## 2017-02-05 DIAGNOSIS — Z961 Presence of intraocular lens: Secondary | ICD-10-CM | POA: Diagnosis present

## 2017-02-05 DIAGNOSIS — I724 Aneurysm of artery of lower extremity: Secondary | ICD-10-CM | POA: Diagnosis not present

## 2017-02-05 DIAGNOSIS — N170 Acute kidney failure with tubular necrosis: Secondary | ICD-10-CM | POA: Diagnosis not present

## 2017-02-05 DIAGNOSIS — E875 Hyperkalemia: Secondary | ICD-10-CM | POA: Diagnosis not present

## 2017-02-05 DIAGNOSIS — I97618 Postprocedural hemorrhage and hematoma of a circulatory system organ or structure following other circulatory system procedure: Secondary | ICD-10-CM | POA: Diagnosis not present

## 2017-02-05 DIAGNOSIS — Z6841 Body Mass Index (BMI) 40.0 and over, adult: Secondary | ICD-10-CM | POA: Diagnosis not present

## 2017-02-05 DIAGNOSIS — N183 Chronic kidney disease, stage 3 unspecified: Secondary | ICD-10-CM | POA: Diagnosis present

## 2017-02-05 DIAGNOSIS — E785 Hyperlipidemia, unspecified: Secondary | ICD-10-CM | POA: Diagnosis present

## 2017-02-05 DIAGNOSIS — Z952 Presence of prosthetic heart valve: Secondary | ICD-10-CM

## 2017-02-05 DIAGNOSIS — I5032 Chronic diastolic (congestive) heart failure: Secondary | ICD-10-CM | POA: Diagnosis present

## 2017-02-05 DIAGNOSIS — D631 Anemia in chronic kidney disease: Secondary | ICD-10-CM | POA: Diagnosis present

## 2017-02-05 DIAGNOSIS — I130 Hypertensive heart and chronic kidney disease with heart failure and stage 1 through stage 4 chronic kidney disease, or unspecified chronic kidney disease: Secondary | ICD-10-CM | POA: Diagnosis present

## 2017-02-05 DIAGNOSIS — N2889 Other specified disorders of kidney and ureter: Secondary | ICD-10-CM | POA: Diagnosis present

## 2017-02-05 DIAGNOSIS — Z7982 Long term (current) use of aspirin: Secondary | ICD-10-CM

## 2017-02-05 DIAGNOSIS — I25110 Atherosclerotic heart disease of native coronary artery with unstable angina pectoris: Secondary | ICD-10-CM | POA: Diagnosis present

## 2017-02-05 DIAGNOSIS — Z96653 Presence of artificial knee joint, bilateral: Secondary | ICD-10-CM | POA: Diagnosis present

## 2017-02-05 DIAGNOSIS — R791 Abnormal coagulation profile: Secondary | ICD-10-CM | POA: Diagnosis not present

## 2017-02-05 DIAGNOSIS — I10 Essential (primary) hypertension: Secondary | ICD-10-CM | POA: Diagnosis present

## 2017-02-05 DIAGNOSIS — Z9049 Acquired absence of other specified parts of digestive tract: Secondary | ICD-10-CM

## 2017-02-05 DIAGNOSIS — N179 Acute kidney failure, unspecified: Secondary | ICD-10-CM | POA: Diagnosis not present

## 2017-02-05 DIAGNOSIS — I6529 Occlusion and stenosis of unspecified carotid artery: Secondary | ICD-10-CM | POA: Diagnosis present

## 2017-02-05 DIAGNOSIS — I5033 Acute on chronic diastolic (congestive) heart failure: Secondary | ICD-10-CM | POA: Diagnosis present

## 2017-02-05 DIAGNOSIS — I959 Hypotension, unspecified: Secondary | ICD-10-CM | POA: Diagnosis not present

## 2017-02-05 DIAGNOSIS — E1169 Type 2 diabetes mellitus with other specified complication: Secondary | ICD-10-CM | POA: Diagnosis present

## 2017-02-05 DIAGNOSIS — Y838 Other surgical procedures as the cause of abnormal reaction of the patient, or of later complication, without mention of misadventure at the time of the procedure: Secondary | ICD-10-CM | POA: Diagnosis not present

## 2017-02-05 DIAGNOSIS — B9620 Unspecified Escherichia coli [E. coli] as the cause of diseases classified elsewhere: Secondary | ICD-10-CM | POA: Diagnosis not present

## 2017-02-05 DIAGNOSIS — K661 Hemoperitoneum: Secondary | ICD-10-CM | POA: Diagnosis not present

## 2017-02-05 DIAGNOSIS — Z8249 Family history of ischemic heart disease and other diseases of the circulatory system: Secondary | ICD-10-CM

## 2017-02-05 DIAGNOSIS — R000 Tachycardia, unspecified: Secondary | ICD-10-CM | POA: Diagnosis not present

## 2017-02-05 DIAGNOSIS — Z09 Encounter for follow-up examination after completed treatment for conditions other than malignant neoplasm: Secondary | ICD-10-CM

## 2017-02-05 DIAGNOSIS — I350 Nonrheumatic aortic (valve) stenosis: Principal | ICD-10-CM | POA: Diagnosis present

## 2017-02-05 DIAGNOSIS — Z006 Encounter for examination for normal comparison and control in clinical research program: Secondary | ICD-10-CM

## 2017-02-05 DIAGNOSIS — R571 Hypovolemic shock: Secondary | ICD-10-CM | POA: Diagnosis not present

## 2017-02-05 DIAGNOSIS — E669 Obesity, unspecified: Secondary | ICD-10-CM

## 2017-02-05 DIAGNOSIS — Z833 Family history of diabetes mellitus: Secondary | ICD-10-CM

## 2017-02-05 DIAGNOSIS — D72829 Elevated white blood cell count, unspecified: Secondary | ICD-10-CM | POA: Diagnosis present

## 2017-02-05 DIAGNOSIS — J9611 Chronic respiratory failure with hypoxia: Secondary | ICD-10-CM

## 2017-02-05 DIAGNOSIS — Z79899 Other long term (current) drug therapy: Secondary | ICD-10-CM

## 2017-02-05 DIAGNOSIS — M109 Gout, unspecified: Secondary | ICD-10-CM | POA: Diagnosis present

## 2017-02-05 DIAGNOSIS — R5381 Other malaise: Secondary | ICD-10-CM | POA: Diagnosis not present

## 2017-02-05 DIAGNOSIS — B964 Proteus (mirabilis) (morganii) as the cause of diseases classified elsewhere: Secondary | ICD-10-CM | POA: Diagnosis not present

## 2017-02-05 DIAGNOSIS — R911 Solitary pulmonary nodule: Secondary | ICD-10-CM | POA: Diagnosis present

## 2017-02-05 DIAGNOSIS — E878 Other disorders of electrolyte and fluid balance, not elsewhere classified: Secondary | ICD-10-CM | POA: Diagnosis present

## 2017-02-05 DIAGNOSIS — I2510 Atherosclerotic heart disease of native coronary artery without angina pectoris: Secondary | ICD-10-CM | POA: Diagnosis present

## 2017-02-05 DIAGNOSIS — Z954 Presence of other heart-valve replacement: Secondary | ICD-10-CM | POA: Diagnosis not present

## 2017-02-05 DIAGNOSIS — E1165 Type 2 diabetes mellitus with hyperglycemia: Secondary | ICD-10-CM | POA: Diagnosis present

## 2017-02-05 DIAGNOSIS — I700 Atherosclerosis of aorta: Secondary | ICD-10-CM | POA: Diagnosis present

## 2017-02-05 DIAGNOSIS — I97638 Postprocedural hematoma of a circulatory system organ or structure following other circulatory system procedure: Secondary | ICD-10-CM

## 2017-02-05 DIAGNOSIS — I9789 Other postprocedural complications and disorders of the circulatory system, not elsewhere classified: Secondary | ICD-10-CM | POA: Diagnosis not present

## 2017-02-05 HISTORY — DX: Presence of prosthetic heart valve: Z95.2

## 2017-02-05 HISTORY — PX: APPLICATION OF WOUND VAC: SHX5189

## 2017-02-05 HISTORY — PX: FEMORAL ARTERY EXPLORATION: SHX5160

## 2017-02-05 HISTORY — PX: TRANSCATHETER AORTIC VALVE REPLACEMENT, TRANSFEMORAL: SHX6400

## 2017-02-05 HISTORY — PX: TEE WITHOUT CARDIOVERSION: SHX5443

## 2017-02-05 LAB — POCT I-STAT 4, (NA,K, GLUC, HGB,HCT)
GLUCOSE: 416 mg/dL — AB (ref 65–99)
GLUCOSE: 320 mg/dL — AB (ref 65–99)
Glucose, Bld: 242 mg/dL — ABNORMAL HIGH (ref 65–99)
Glucose, Bld: 291 mg/dL — ABNORMAL HIGH (ref 65–99)
HCT: 18 % — ABNORMAL LOW (ref 36.0–46.0)
HCT: 42 % (ref 36.0–46.0)
HCT: 32 % — ABNORMAL LOW (ref 36.0–46.0)
HEMATOCRIT: 33 % — AB (ref 36.0–46.0)
Hemoglobin: 6.1 g/dL — CL (ref 12.0–15.0)
Hemoglobin: 14.3 g/dL (ref 12.0–15.0)
Hemoglobin: 10.9 g/dL — ABNORMAL LOW (ref 12.0–15.0)
Hemoglobin: 11.2 g/dL — ABNORMAL LOW (ref 12.0–15.0)
Potassium: 3.9 mmol/L (ref 3.5–5.1)
Potassium: 5.5 mmol/L — ABNORMAL HIGH (ref 3.5–5.1)
Potassium: 4.1 mmol/L (ref 3.5–5.1)
Potassium: 4.2 mmol/L (ref 3.5–5.1)
SODIUM: 140 mmol/L (ref 135–145)
SODIUM: 147 mmol/L — AB (ref 135–145)
Sodium: 139 mmol/L (ref 135–145)
Sodium: 150 mmol/L — ABNORMAL HIGH (ref 135–145)

## 2017-02-05 LAB — POCT I-STAT, CHEM 8
BUN: 34 mg/dL — ABNORMAL HIGH (ref 6–20)
BUN: 35 mg/dL — ABNORMAL HIGH (ref 6–20)
BUN: 34 mg/dL — ABNORMAL HIGH (ref 6–20)
CALCIUM ION: 1.26 mmol/L (ref 1.15–1.40)
CALCIUM ION: 1.28 mmol/L (ref 1.15–1.40)
CALCIUM ION: 1.29 mmol/L (ref 1.15–1.40)
CREATININE: 1.5 mg/dL — AB (ref 0.44–1.00)
CREATININE: 1.5 mg/dL — AB (ref 0.44–1.00)
Chloride: 108 mmol/L (ref 101–111)
Chloride: 107 mmol/L (ref 101–111)
Chloride: 106 mmol/L (ref 101–111)
Creatinine, Ser: 1.5 mg/dL — ABNORMAL HIGH (ref 0.44–1.00)
GLUCOSE: 189 mg/dL — AB (ref 65–99)
GLUCOSE: 253 mg/dL — AB (ref 65–99)
GLUCOSE: 257 mg/dL — AB (ref 65–99)
HCT: 24 % — ABNORMAL LOW (ref 36.0–46.0)
HCT: 20 % — ABNORMAL LOW (ref 36.0–46.0)
HEMATOCRIT: 23 % — AB (ref 36.0–46.0)
HEMOGLOBIN: 7.8 g/dL — AB (ref 12.0–15.0)
Hemoglobin: 8.2 g/dL — ABNORMAL LOW (ref 12.0–15.0)
Hemoglobin: 6.8 g/dL — CL (ref 12.0–15.0)
POTASSIUM: 3.7 mmol/L (ref 3.5–5.1)
POTASSIUM: 3.9 mmol/L (ref 3.5–5.1)
Potassium: 3.9 mmol/L (ref 3.5–5.1)
Sodium: 142 mmol/L (ref 135–145)
Sodium: 142 mmol/L (ref 135–145)
Sodium: 141 mmol/L (ref 135–145)
TCO2: 22 mmol/L (ref 22–32)
TCO2: 23 mmol/L (ref 22–32)
TCO2: 23 mmol/L (ref 22–32)

## 2017-02-05 LAB — COMPREHENSIVE METABOLIC PANEL
ALK PHOS: 37 U/L — AB (ref 38–126)
ALK PHOS: 34 U/L — AB (ref 38–126)
ALT: 11 U/L — AB (ref 14–54)
ALT: 36 U/L (ref 14–54)
ANION GAP: 5 (ref 5–15)
AST: 27 U/L (ref 15–41)
AST: 64 U/L — ABNORMAL HIGH (ref 15–41)
Albumin: 2 g/dL — ABNORMAL LOW (ref 3.5–5.0)
Albumin: 2.3 g/dL — ABNORMAL LOW (ref 3.5–5.0)
Anion gap: 11 (ref 5–15)
BILIRUBIN TOTAL: 0.6 mg/dL (ref 0.3–1.2)
BILIRUBIN TOTAL: 2.1 mg/dL — AB (ref 0.3–1.2)
BUN: 35 mg/dL — ABNORMAL HIGH (ref 6–20)
BUN: 35 mg/dL — ABNORMAL HIGH (ref 6–20)
CALCIUM: 6.9 mg/dL — AB (ref 8.9–10.3)
CALCIUM: 8.9 mg/dL (ref 8.9–10.3)
CO2: 12 mmol/L — AB (ref 22–32)
CO2: 24 mmol/L (ref 22–32)
CREATININE: 1.64 mg/dL — AB (ref 0.44–1.00)
Chloride: 113 mmol/L — ABNORMAL HIGH (ref 101–111)
Chloride: 116 mmol/L — ABNORMAL HIGH (ref 101–111)
Creatinine, Ser: 1.7 mg/dL — ABNORMAL HIGH (ref 0.44–1.00)
GFR calc non Af Amer: 28 mL/min — ABNORMAL LOW (ref >60)
GFR calc non Af Amer: 29 mL/min — ABNORMAL LOW (ref >60)
GFR, EST AFRICAN AMERICAN: 32 mL/min — AB (ref >60)
GFR, EST AFRICAN AMERICAN: 33 mL/min — AB (ref >60)
GLUCOSE: 439 mg/dL — AB (ref 65–99)
GLUCOSE: 255 mg/dL — AB (ref 65–99)
Potassium: 5.7 mmol/L — ABNORMAL HIGH (ref 3.5–5.1)
Potassium: 3.9 mmol/L (ref 3.5–5.1)
SODIUM: 136 mmol/L (ref 135–145)
SODIUM: 145 mmol/L (ref 135–145)
TOTAL PROTEIN: 3.9 g/dL — AB (ref 6.5–8.1)
TOTAL PROTEIN: 3.9 g/dL — AB (ref 6.5–8.1)

## 2017-02-05 LAB — POCT I-STAT 7, (LYTES, BLD GAS, ICA,H+H)
ACID-BASE DEFICIT: 15 mmol/L — AB (ref 0.0–2.0)
Acid-base deficit: 9 mmol/L — ABNORMAL HIGH (ref 0.0–2.0)
BICARBONATE: 19.8 mmol/L — AB (ref 20.0–28.0)
Bicarbonate: 15.4 mmol/L — ABNORMAL LOW (ref 20.0–28.0)
CALCIUM ION: 0.43 mmol/L — AB (ref 1.15–1.40)
CALCIUM ION: 0.93 mmol/L — AB (ref 1.15–1.40)
HCT: 28 % — ABNORMAL LOW (ref 36.0–46.0)
HEMATOCRIT: 28 % — AB (ref 36.0–46.0)
HEMOGLOBIN: 9.5 g/dL — AB (ref 12.0–15.0)
Hemoglobin: 9.5 g/dL — ABNORMAL LOW (ref 12.0–15.0)
O2 Saturation: 100 %
O2 Saturation: 100 %
PH ART: 7.192 — AB (ref 7.350–7.450)
POTASSIUM: 4.7 mmol/L (ref 3.5–5.1)
Patient temperature: 34.4
Potassium: 4.6 mmol/L (ref 3.5–5.1)
SODIUM: 147 mmol/L — AB (ref 135–145)
SODIUM: 147 mmol/L — AB (ref 135–145)
TCO2: 21 mmol/L — AB (ref 22–32)
TCO2: 17 mmol/L — ABNORMAL LOW (ref 22–32)
pCO2 arterial: 49.8 mmHg — ABNORMAL HIGH (ref 32.0–48.0)
pCO2 arterial: 50.6 mmHg — ABNORMAL HIGH (ref 32.0–48.0)
pH, Arterial: 7.066 — CL (ref 7.350–7.450)
pO2, Arterial: 393 mmHg — ABNORMAL HIGH (ref 83.0–108.0)
pO2, Arterial: 402 mmHg — ABNORMAL HIGH (ref 83.0–108.0)

## 2017-02-05 LAB — POCT I-STAT 3, ART BLOOD GAS (G3+)
ACID-BASE DEFICIT: 4 mmol/L — AB (ref 0.0–2.0)
Acid-base deficit: 1 mmol/L (ref 0.0–2.0)
BICARBONATE: 24.6 mmol/L (ref 20.0–28.0)
BICARBONATE: 23.2 mmol/L (ref 20.0–28.0)
BICARBONATE: 25.4 mmol/L (ref 20.0–28.0)
O2 SAT: 100 %
O2 Saturation: 100 %
O2 Saturation: 100 %
PCO2 ART: 37.8 mmHg (ref 32.0–48.0)
PCO2 ART: 50.3 mmHg — AB (ref 32.0–48.0)
PH ART: 7.411 (ref 7.350–7.450)
PH ART: 7.408 (ref 7.350–7.450)
PO2 ART: 267 mmHg — AB (ref 83.0–108.0)
PO2 ART: 175 mmHg — AB (ref 83.0–108.0)
Patient temperature: 94.4
Patient temperature: 96.3
TCO2: 26 mmol/L (ref 22–32)
TCO2: 25 mmol/L (ref 22–32)
TCO2: 27 mmol/L (ref 22–32)
pCO2 arterial: 39.8 mmHg (ref 32.0–48.0)
pH, Arterial: 7.272 — ABNORMAL LOW (ref 7.350–7.450)
pO2, Arterial: 181 mmHg — ABNORMAL HIGH (ref 83.0–108.0)

## 2017-02-05 LAB — POCT I-STAT 3, VENOUS BLOOD GAS (G3P V)
ACID-BASE DEFICIT: 14 mmol/L — AB (ref 0.0–2.0)
Bicarbonate: 13.8 mmol/L — ABNORMAL LOW (ref 20.0–28.0)
O2 SAT: 100 %
TCO2: 15 mmol/L — ABNORMAL LOW (ref 22–32)
pCO2, Ven: 40.3 mmHg — ABNORMAL LOW (ref 44.0–60.0)
pH, Ven: 7.144 — CL (ref 7.250–7.430)
pO2, Ven: 334 mmHg — ABNORMAL HIGH (ref 32.0–45.0)

## 2017-02-05 LAB — CBC
HCT: 37.3 % (ref 36.0–46.0)
HEMATOCRIT: 23.7 % — AB (ref 36.0–46.0)
HEMATOCRIT: 44.2 % (ref 36.0–46.0)
HEMOGLOBIN: 14.2 g/dL (ref 12.0–15.0)
Hemoglobin: 7.9 g/dL — ABNORMAL LOW (ref 12.0–15.0)
Hemoglobin: 12.3 g/dL (ref 12.0–15.0)
MCH: 31.9 pg (ref 26.0–34.0)
MCH: 30.1 pg (ref 26.0–34.0)
MCH: 29.7 pg (ref 26.0–34.0)
MCHC: 33.3 g/dL (ref 30.0–36.0)
MCHC: 32.1 g/dL (ref 30.0–36.0)
MCHC: 33 g/dL (ref 30.0–36.0)
MCV: 95.6 fL (ref 78.0–100.0)
MCV: 93.8 fL (ref 78.0–100.0)
MCV: 90.1 fL (ref 78.0–100.0)
PLATELETS: 105 10*3/uL — AB (ref 150–400)
PLATELETS: 44 10*3/uL — AB (ref 150–400)
Platelets: 82 10*3/uL — ABNORMAL LOW (ref 150–400)
RBC: 2.48 MIL/uL — ABNORMAL LOW (ref 3.87–5.11)
RBC: 4.71 MIL/uL (ref 3.87–5.11)
RBC: 4.14 MIL/uL (ref 3.87–5.11)
RDW: 15 % (ref 11.5–15.5)
RDW: 15.3 % (ref 11.5–15.5)
RDW: 14 % (ref 11.5–15.5)
WBC: 7.1 10*3/uL (ref 4.0–10.5)
WBC: 18.5 10*3/uL — ABNORMAL HIGH (ref 4.0–10.5)
WBC: 9.6 10*3/uL (ref 4.0–10.5)

## 2017-02-05 LAB — GLUCOSE, CAPILLARY
GLUCOSE-CAPILLARY: 216 mg/dL — AB (ref 65–99)
GLUCOSE-CAPILLARY: 193 mg/dL — AB (ref 65–99)
GLUCOSE-CAPILLARY: 171 mg/dL — AB (ref 65–99)
Glucose-Capillary: 251 mg/dL — ABNORMAL HIGH (ref 65–99)
Glucose-Capillary: 238 mg/dL — ABNORMAL HIGH (ref 65–99)

## 2017-02-05 LAB — ECHOCARDIOGRAM LIMITED
Height: 63 in
Weight: 3936 oz

## 2017-02-05 LAB — PREPARE RBC (CROSSMATCH)

## 2017-02-05 LAB — PROTIME-INR
INR: 1.43
INR: 1.72
INR: 1.38
PROTHROMBIN TIME: 17.4 s — AB (ref 11.4–15.2)
PROTHROMBIN TIME: 20 s — AB (ref 11.4–15.2)
PROTHROMBIN TIME: 16.8 s — AB (ref 11.4–15.2)

## 2017-02-05 LAB — DIC (DISSEMINATED INTRAVASCULAR COAGULATION) PANEL
INR: 1.6
PROTHROMBIN TIME: 18.9 s — AB (ref 11.4–15.2)

## 2017-02-05 LAB — DIC (DISSEMINATED INTRAVASCULAR COAGULATION)PANEL
D-Dimer, Quant: 7.66 ug/mL-FEU — ABNORMAL HIGH (ref 0.00–0.50)
Fibrinogen: 168 mg/dL — ABNORMAL LOW (ref 210–475)
Platelets: 45 10*3/uL — ABNORMAL LOW (ref 150–400)
Smear Review: NONE SEEN
aPTT: 50 seconds — ABNORMAL HIGH (ref 24–36)

## 2017-02-05 LAB — APTT
APTT: 53 s — AB (ref 24–36)
aPTT: 27 seconds (ref 24–36)

## 2017-02-05 SURGERY — IMPLANTATION, AORTIC VALVE, TRANSCATHETER, FEMORAL APPROACH
Anesthesia: General

## 2017-02-05 SURGERY — EXPLORATION, ARTERY, FEMORAL
Anesthesia: General | Site: Groin

## 2017-02-05 MED ORDER — NOREPINEPHRINE BITARTRATE 1 MG/ML IV SOLN
0.0000 ug/min | INTRAVENOUS | Status: DC
  Administered 2017-02-05: 80 ug/min via INTRAVENOUS
  Filled 2017-02-05 (×2): qty 16

## 2017-02-05 MED ORDER — ONDANSETRON HCL 4 MG/2ML IJ SOLN
4.0000 mg | Freq: Four times a day (QID) | INTRAMUSCULAR | Status: DC | PRN
  Administered 2017-02-17: 4 mg via INTRAVENOUS
  Filled 2017-02-05: qty 2

## 2017-02-05 MED ORDER — SODIUM CHLORIDE 0.9 % IV SOLN
0.0000 ug/kg/h | INTRAVENOUS | Status: DC
  Administered 2017-02-05: 0.7 ug/kg/h via INTRAVENOUS
  Administered 2017-02-05: 0.5 ug/kg/h via INTRAVENOUS
  Administered 2017-02-05: 0.7 ug/kg/h via INTRAVENOUS
  Administered 2017-02-06: 0.3 ug/kg/h via INTRAVENOUS
  Administered 2017-02-06 (×2): 0.7 ug/kg/h via INTRAVENOUS
  Filled 2017-02-05 (×7): qty 2

## 2017-02-05 MED ORDER — FAMOTIDINE IN NACL 20-0.9 MG/50ML-% IV SOLN
20.0000 mg | Freq: Two times a day (BID) | INTRAVENOUS | Status: DC
  Administered 2017-02-05: 20 mg via INTRAVENOUS
  Filled 2017-02-05 (×2): qty 50

## 2017-02-05 MED ORDER — SODIUM CHLORIDE 0.9 % IV SOLN: 10.0000 mL/h | Freq: Once | INTRAVENOUS | Status: DC

## 2017-02-05 MED ORDER — DEXTROSE 5 % IV SOLN
INTRAVENOUS | Status: DC | PRN
  Administered 2017-02-05: 20 ug/min via INTRAVENOUS

## 2017-02-05 MED ORDER — SODIUM CHLORIDE 0.9 % IV SOLN
INTRAVENOUS | Status: DC | PRN
  Administered 2017-02-05: 14:00:00

## 2017-02-05 MED ORDER — FENTANYL CITRATE (PF) 100 MCG/2ML IJ SOLN
INTRAMUSCULAR | Status: DC | PRN
  Administered 2017-02-05: 100 ug via INTRAVENOUS
  Administered 2017-02-05: 50 ug via INTRAVENOUS
  Administered 2017-02-05: 100 ug via INTRAVENOUS

## 2017-02-05 MED ORDER — NITROGLYCERIN 0.2 MG/ML ON CALL CATH LAB
INTRAVENOUS | Status: DC | PRN
  Administered 2017-02-05: 20 ug via INTRAVENOUS

## 2017-02-05 MED ORDER — LACTATED RINGERS IV SOLN
INTRAVENOUS | Status: DC | PRN
  Administered 2017-02-05: 07:00:00 via INTRAVENOUS

## 2017-02-05 MED ORDER — SODIUM CHLORIDE 0.9 % IV SOLN
0.0000 ug/min | INTRAVENOUS | Status: DC
  Filled 2017-02-05: qty 4

## 2017-02-05 MED ORDER — SODIUM CHLORIDE 0.45 % IV SOLN: INTRAVENOUS | Status: DC | PRN

## 2017-02-05 MED ORDER — ACETAMINOPHEN 160 MG/5ML PO SOLN
650.0000 mg | Freq: Once | ORAL | Status: AC
Start: 2017-02-05 — End: 2017-02-05
  Administered 2017-02-05: 650 mg
  Filled 2017-02-05: qty 20.3

## 2017-02-05 MED ORDER — FENTANYL CITRATE (PF) 250 MCG/5ML IJ SOLN
INTRAMUSCULAR | Status: DC | PRN
  Administered 2017-02-05: 50 ug via INTRAVENOUS

## 2017-02-05 MED ORDER — ACETAMINOPHEN 650 MG RE SUPP: 650.0000 mg | Freq: Once | RECTAL | Status: AC

## 2017-02-05 MED ORDER — SODIUM CHLORIDE 0.9 % IV SOLN
INTRAVENOUS | Status: DC
  Administered 2017-02-05: 4.6 [IU]/h via INTRAVENOUS
  Administered 2017-02-06: 8.9 [IU]/h via INTRAVENOUS
  Filled 2017-02-05 (×2): qty 1

## 2017-02-05 MED ORDER — SODIUM CHLORIDE 0.9 % IV SOLN: INTRAVENOUS | Status: DC

## 2017-02-05 MED ORDER — METOPROLOL TARTRATE 25 MG PO TABS: 25.0000 mg | ORAL_TABLET | Freq: Two times a day (BID) | ORAL | Status: DC

## 2017-02-05 MED ORDER — POTASSIUM CHLORIDE 10 MEQ/50ML IV SOLN: 10.0000 meq | INTRAVENOUS | Status: AC

## 2017-02-05 MED ORDER — ROCURONIUM BROMIDE 10 MG/ML (PF) SYRINGE
PREFILLED_SYRINGE | INTRAVENOUS | Status: AC
  Filled 2017-02-05: qty 5

## 2017-02-05 MED ORDER — ASPIRIN EC 325 MG PO TBEC: 325.0000 mg | DELAYED_RELEASE_TABLET | Freq: Every day | ORAL | Status: DC

## 2017-02-05 MED ORDER — MORPHINE SULFATE (PF) 4 MG/ML IV SOLN: 1.0000 mg | INTRAVENOUS | Status: DC | PRN

## 2017-02-05 MED ORDER — MIDAZOLAM HCL 2 MG/2ML IJ SOLN
INTRAMUSCULAR | Status: DC | PRN
  Administered 2017-02-05: 2 mg via INTRAVENOUS

## 2017-02-05 MED ORDER — HYDRALAZINE HCL 50 MG PO TABS: 50.0000 mg | ORAL_TABLET | Freq: Two times a day (BID) | ORAL | Status: DC

## 2017-02-05 MED ORDER — HEPARIN SODIUM (PORCINE) 1000 UNIT/ML IJ SOLN
INTRAMUSCULAR | Status: DC | PRN
  Administered 2017-02-05: 12000 [IU] via INTRAVENOUS

## 2017-02-05 MED ORDER — 0.9 % SODIUM CHLORIDE (POUR BTL) OPTIME
TOPICAL | Status: DC | PRN
  Administered 2017-02-05: 1000 mL

## 2017-02-05 MED ORDER — PROPOFOL 500 MG/50ML IV EMUL
INTRAVENOUS | Status: DC | PRN
  Administered 2017-02-05: 25 ug/kg/min via INTRAVENOUS

## 2017-02-05 MED ORDER — SODIUM CHLORIDE 0.9 % IV SOLN
0.4000 ug/kg/h | INTRAVENOUS | Status: DC
  Filled 2017-02-05: qty 2

## 2017-02-05 MED ORDER — OXYCODONE HCL 5 MG PO TABS: 5.0000 mg | ORAL_TABLET | ORAL | Status: DC | PRN

## 2017-02-05 MED ORDER — SODIUM CHLORIDE 0.9 % IV SOLN
INTRAVENOUS | Status: DC | PRN
  Administered 2017-02-05: 500 mL

## 2017-02-05 MED ORDER — LACTATED RINGERS IV SOLN
INTRAVENOUS | Status: DC | PRN
  Administered 2017-02-05: 13:00:00 via INTRAVENOUS

## 2017-02-05 MED ORDER — SUCCINYLCHOLINE CHLORIDE 200 MG/10ML IV SOSY
PREFILLED_SYRINGE | INTRAVENOUS | Status: AC
  Filled 2017-02-05: qty 10

## 2017-02-05 MED ORDER — GLYCOPYRROLATE 0.2 MG/ML IJ SOLN
INTRAMUSCULAR | Status: DC | PRN
  Administered 2017-02-05: 0.2 mg via INTRAVENOUS

## 2017-02-05 MED ORDER — FENTANYL CITRATE (PF) 250 MCG/5ML IJ SOLN
INTRAMUSCULAR | Status: AC
  Filled 2017-02-05: qty 5

## 2017-02-05 MED ORDER — SODIUM CHLORIDE 0.9% FLUSH
3.0000 mL | INTRAVENOUS | Status: DC | PRN
  Administered 2017-02-12 – 2017-02-23 (×2): 3 mL via INTRAVENOUS
  Filled 2017-02-05 (×2): qty 3

## 2017-02-05 MED ORDER — VANCOMYCIN HCL 1000 MG IV SOLR
INTRAVENOUS | Status: DC | PRN
  Administered 2017-02-05: 1250 mg via INTRAVENOUS

## 2017-02-05 MED ORDER — CHLORHEXIDINE GLUCONATE 4 % EX LIQD: 60.0000 mL | Freq: Once | CUTANEOUS | Status: DC

## 2017-02-05 MED ORDER — 0.9 % SODIUM CHLORIDE (POUR BTL) OPTIME
TOPICAL | Status: DC | PRN
  Administered 2017-02-05: 1000 mL
  Administered 2017-02-05: 2000 mL

## 2017-02-05 MED ORDER — DOPAMINE-DEXTROSE 3.2-5 MG/ML-% IV SOLN: 0.0000 ug/kg/min | INTRAVENOUS | Status: DC

## 2017-02-05 MED ORDER — MORPHINE SULFATE (PF) 4 MG/ML IV SOLN
1.0000 mg | INTRAVENOUS | Status: DC | PRN
  Administered 2017-02-06 (×2): 2 mg via INTRAVENOUS
  Filled 2017-02-05 (×2): qty 1

## 2017-02-05 MED ORDER — LACTATED RINGERS IV SOLN: 500.0000 mL | Freq: Once | INTRAVENOUS | Status: DC | PRN

## 2017-02-05 MED ORDER — NITROGLYCERIN IN D5W 200-5 MCG/ML-% IV SOLN: 0.0000 ug/min | INTRAVENOUS | Status: DC

## 2017-02-05 MED ORDER — CHLORHEXIDINE GLUCONATE 4 % EX LIQD: 30.0000 mL | CUTANEOUS | Status: DC

## 2017-02-05 MED ORDER — MORPHINE SULFATE (PF) 2 MG/ML IV SOLN: 2.0000 mg | INTRAVENOUS | Status: DC | PRN

## 2017-02-05 MED ORDER — SODIUM CHLORIDE 0.9 % IV SOLN
INTRAVENOUS | Status: DC
  Administered 2017-02-05: 17:00:00 via INTRAVENOUS

## 2017-02-05 MED ORDER — CALCIUM CHLORIDE 10 % IV SOLN
INTRAVENOUS | Status: DC | PRN
  Administered 2017-02-05: .5 g via INTRAVENOUS
  Administered 2017-02-05 (×3): 0.5 g via INTRAVENOUS
  Administered 2017-02-05: .5 g via INTRAVENOUS
  Administered 2017-02-05: 0.5 g via INTRAVENOUS

## 2017-02-05 MED ORDER — TAMOXIFEN CITRATE 10 MG PO TABS: 20.0000 mg | ORAL_TABLET | Freq: Every morning | ORAL | Status: DC

## 2017-02-05 MED ORDER — LIDOCAINE HCL (PF) 1 % IJ SOLN
INTRAMUSCULAR | Status: DC | PRN
  Administered 2017-02-05: 15 mL

## 2017-02-05 MED ORDER — DEXTROSE 5 % IV SOLN
1.5000 g | Freq: Two times a day (BID) | INTRAVENOUS | Status: DC
  Administered 2017-02-05 – 2017-02-06 (×2): 1.5 g via INTRAVENOUS
  Filled 2017-02-05 (×2): qty 1.5

## 2017-02-05 MED ORDER — MIDAZOLAM HCL 5 MG/5ML IJ SOLN
INTRAMUSCULAR | Status: DC | PRN
  Administered 2017-02-05: 2 mg via INTRAVENOUS

## 2017-02-05 MED ORDER — NOREPINEPHRINE BITARTRATE 1 MG/ML IV SOLN
INTRAVENOUS | Status: DC | PRN
  Administered 2017-02-05: 40 ug/min via INTRAVENOUS

## 2017-02-05 MED ORDER — ACETAMINOPHEN 160 MG/5ML PO SOLN: 1000.0000 mg | Freq: Four times a day (QID) | ORAL | Status: AC

## 2017-02-05 MED ORDER — LIDOCAINE 2% (20 MG/ML) 5 ML SYRINGE
INTRAMUSCULAR | Status: AC
  Filled 2017-02-05: qty 5

## 2017-02-05 MED ORDER — SODIUM BICARBONATE 8.4 % IV SOLN
INTRAVENOUS | Status: DC | PRN
  Administered 2017-02-05 (×4): 50 meq via INTRAVENOUS

## 2017-02-05 MED ORDER — DIPHENHYDRAMINE HCL 50 MG/ML IJ SOLN
INTRAMUSCULAR | Status: DC | PRN
  Administered 2017-02-05: 12.5 mg via INTRAVENOUS

## 2017-02-05 MED ORDER — FAMOTIDINE IN NACL 20-0.9 MG/50ML-% IV SOLN: 20.0000 mg | Freq: Two times a day (BID) | INTRAVENOUS | Status: DC

## 2017-02-05 MED ORDER — METOPROLOL TARTRATE 5 MG/5ML IV SOLN: 2.5000 mg | INTRAVENOUS | Status: DC | PRN

## 2017-02-05 MED ORDER — SODIUM CHLORIDE 0.9 % IV SOLN: Freq: Once | INTRAVENOUS | Status: DC

## 2017-02-05 MED ORDER — EPINEPHRINE PF 1 MG/10ML IJ SOSY
PREFILLED_SYRINGE | INTRAMUSCULAR | Status: DC | PRN
  Administered 2017-02-05: 100 ug via INTRAVENOUS
  Administered 2017-02-05: 200 ug via INTRAVENOUS
  Administered 2017-02-05: 500 ug via INTRAVENOUS
  Administered 2017-02-05: 200 ug via INTRAVENOUS

## 2017-02-05 MED ORDER — MIDAZOLAM HCL 2 MG/2ML IJ SOLN
INTRAMUSCULAR | Status: AC
  Filled 2017-02-05: qty 2

## 2017-02-05 MED ORDER — DOPAMINE-DEXTROSE 1.6-5 MG/ML-% IV SOLN
INTRAVENOUS | Status: DC | PRN
  Administered 2017-02-05: 5 ug/kg/min via INTRAVENOUS

## 2017-02-05 MED ORDER — CHLORHEXIDINE GLUCONATE 0.12 % MT SOLN
15.0000 mL | OROMUCOSAL | Status: AC
  Administered 2017-02-05: 15 mL via OROMUCOSAL

## 2017-02-05 MED ORDER — CLOPIDOGREL BISULFATE 75 MG PO TABS: 75.0000 mg | ORAL_TABLET | Freq: Every day | ORAL | Status: DC

## 2017-02-05 MED ORDER — LACTATED RINGERS IV SOLN
INTRAVENOUS | Status: DC
Start: 2017-02-05 — End: 2017-02-06

## 2017-02-05 MED ORDER — ACETAMINOPHEN 500 MG PO TABS: 1000.0000 mg | ORAL_TABLET | Freq: Four times a day (QID) | ORAL | Status: DC

## 2017-02-05 MED ORDER — PROPOFOL 10 MG/ML IV BOLUS
INTRAVENOUS | Status: AC
  Filled 2017-02-05: qty 20

## 2017-02-05 MED ORDER — ROCURONIUM BROMIDE 100 MG/10ML IV SOLN
INTRAVENOUS | Status: DC | PRN
  Administered 2017-02-05 (×2): 100 mg via INTRAVENOUS

## 2017-02-05 MED ORDER — ACETAMINOPHEN 160 MG/5ML PO SOLN: 1000.0000 mg | Freq: Four times a day (QID) | ORAL | Status: DC

## 2017-02-05 MED ORDER — VANCOMYCIN HCL IN DEXTROSE 1-5 GM/200ML-% IV SOLN
1000.0000 mg | Freq: Once | INTRAVENOUS | Status: AC
  Administered 2017-02-06: 1000 mg via INTRAVENOUS
  Filled 2017-02-05: qty 200

## 2017-02-05 MED ORDER — PANTOPRAZOLE SODIUM 40 MG PO TBEC: 40.0000 mg | DELAYED_RELEASE_TABLET | Freq: Every day | ORAL | Status: DC

## 2017-02-05 MED ORDER — INSULIN REGULAR BOLUS VIA INFUSION
0.0000 [IU] | Freq: Three times a day (TID) | INTRAVENOUS | Status: DC
  Filled 2017-02-05: qty 10

## 2017-02-05 MED ORDER — MORPHINE SULFATE (PF) 4 MG/ML IV SOLN: 2.0000 mg | INTRAVENOUS | Status: DC | PRN

## 2017-02-05 MED ORDER — FENTANYL CITRATE (PF) 250 MCG/5ML IJ SOLN
INTRAMUSCULAR | Status: AC
Start: 2017-02-05 — End: 2017-02-05
  Filled 2017-02-05: qty 5

## 2017-02-05 MED ORDER — MIDAZOLAM HCL 2 MG/2ML IJ SOLN: 2.0000 mg | INTRAMUSCULAR | Status: DC | PRN

## 2017-02-05 MED ORDER — LACTATED RINGERS IV SOLN: INTRAVENOUS | Status: DC

## 2017-02-05 MED ORDER — IODIXANOL 320 MG/ML IV SOLN
INTRAVENOUS | Status: DC | PRN
  Administered 2017-02-05: 39.5 mL via INTRAVENOUS

## 2017-02-05 MED ORDER — SODIUM CHLORIDE 0.9% FLUSH
3.0000 mL | Freq: Two times a day (BID) | INTRAVENOUS | Status: DC
  Administered 2017-02-06 – 2017-02-10 (×10): 3 mL via INTRAVENOUS
  Administered 2017-02-11: 10 mL via INTRAVENOUS
  Administered 2017-02-12 – 2017-02-27 (×27): 3 mL via INTRAVENOUS

## 2017-02-05 MED ORDER — ROSUVASTATIN CALCIUM 10 MG PO TABS: 10.0000 mg | ORAL_TABLET | Freq: Every day | ORAL | Status: DC

## 2017-02-05 MED ORDER — EPHEDRINE SULFATE 50 MG/ML IJ SOLN
INTRAMUSCULAR | Status: DC | PRN
  Administered 2017-02-05: 15 mg via INTRAVENOUS

## 2017-02-05 MED ORDER — SODIUM CHLORIDE 0.9 % IV SOLN
0.0000 ug/min | INTRAVENOUS | Status: DC
  Filled 2017-02-05: qty 2

## 2017-02-05 MED ORDER — DEXMEDETOMIDINE HCL IN NACL 200 MCG/50ML IV SOLN
INTRAVENOUS | Status: AC
  Filled 2017-02-05: qty 50

## 2017-02-05 MED ORDER — EPINEPHRINE PF 1 MG/10ML IJ SOSY
PREFILLED_SYRINGE | INTRAMUSCULAR | Status: AC
  Filled 2017-02-05: qty 10

## 2017-02-05 MED ORDER — MAGNESIUM SULFATE 4 GM/100ML IV SOLN: 4.0000 g | Freq: Once | INTRAVENOUS | Status: DC

## 2017-02-05 MED ORDER — DEXTROSE 5 % IV SOLN
0.0000 ug/min | INTRAVENOUS | Status: DC
  Administered 2017-02-05: 2 ug/min via INTRAVENOUS
  Administered 2017-02-06: 4 ug/min via INTRAVENOUS
  Filled 2017-02-05 (×2): qty 4

## 2017-02-05 MED ORDER — NOREPINEPHRINE BITARTRATE 1 MG/ML IV SOLN
0.0000 ug/min | INTRAVENOUS | Status: DC
  Filled 2017-02-05: qty 16

## 2017-02-05 MED ORDER — ACETAMINOPHEN 500 MG PO TABS
1000.0000 mg | ORAL_TABLET | Freq: Four times a day (QID) | ORAL | Status: AC
  Administered 2017-02-06 – 2017-02-09 (×12): 1000 mg via ORAL
  Administered 2017-02-09: 500 mg via ORAL
  Administered 2017-02-10 (×3): 1000 mg via ORAL
  Filled 2017-02-05 (×16): qty 2

## 2017-02-05 MED ORDER — CHLORHEXIDINE GLUCONATE 0.12 % MT SOLN
15.0000 mL | Freq: Once | OROMUCOSAL | Status: AC
  Administered 2017-02-05 – 2017-02-06 (×2): 15 mL via OROMUCOSAL
  Filled 2017-02-05: qty 15

## 2017-02-05 MED ORDER — ASPIRIN 81 MG PO CHEW: 81.0000 mg | CHEWABLE_TABLET | Freq: Every day | ORAL | Status: DC

## 2017-02-05 MED ORDER — TRAMADOL HCL 50 MG PO TABS: 50.0000 mg | ORAL_TABLET | ORAL | Status: DC | PRN

## 2017-02-05 MED ORDER — SODIUM CHLORIDE 0.9 % IV SOLN: 250.0000 mL | INTRAVENOUS | Status: DC

## 2017-02-05 MED ORDER — DEXTROSE 5 % IV SOLN
1.5000 g | Freq: Two times a day (BID) | INTRAVENOUS | Status: DC
  Filled 2017-02-05: qty 1.5

## 2017-02-05 MED ORDER — VASOPRESSIN 20 UNIT/ML IV SOLN
40.0000 [IU] | Freq: Once | INTRAVENOUS | Status: AC
  Administered 2017-02-05: 2 [IU] via INTRAVENOUS
  Filled 2017-02-05: qty 2

## 2017-02-05 MED ORDER — SODIUM CHLORIDE 0.9 % IV SOLN
INTRAVENOUS | Status: DC
  Administered 2017-02-05 (×2): via INTRAVENOUS

## 2017-02-05 MED ORDER — ALBUMIN HUMAN 5 % IV SOLN: 250.0000 mL | INTRAVENOUS | Status: AC | PRN

## 2017-02-05 MED ORDER — ASPIRIN 81 MG PO CHEW: 324.0000 mg | CHEWABLE_TABLET | Freq: Every day | ORAL | Status: DC

## 2017-02-05 MED ORDER — PHENYLEPHRINE HCL 10 MG/ML IJ SOLN
INTRAVENOUS | Status: DC | PRN
  Administered 2017-02-05: 100 ug/min via INTRAVENOUS

## 2017-02-05 MED ORDER — FUROSEMIDE 40 MG PO TABS: 40.0000 mg | ORAL_TABLET | Freq: Every day | ORAL | Status: DC

## 2017-02-05 MED ORDER — ALBUMIN HUMAN 5 % IV SOLN: 250.0000 mL | INTRAVENOUS | Status: DC | PRN

## 2017-02-05 MED ORDER — EPINEPHRINE PF 1 MG/ML IJ SOLN
0.0000 ug/min | INTRAVENOUS | Status: DC
  Administered 2017-02-05: 10 ug/min via INTRAVENOUS
  Filled 2017-02-05: qty 4

## 2017-02-05 MED ORDER — HEPARIN SODIUM (PORCINE) 1000 UNIT/ML IJ SOLN
INTRAMUSCULAR | Status: AC
  Filled 2017-02-05: qty 1

## 2017-02-05 MED ORDER — PROTAMINE SULFATE 10 MG/ML IV SOLN
INTRAVENOUS | Status: DC | PRN
  Administered 2017-02-05: 50 mg via INTRAVENOUS
  Administered 2017-02-05: 10 mg via INTRAVENOUS
  Administered 2017-02-05: 20 mg via INTRAVENOUS
  Administered 2017-02-05: 40 mg via INTRAVENOUS

## 2017-02-05 MED ORDER — ONDANSETRON HCL 4 MG/2ML IJ SOLN: 4.0000 mg | Freq: Four times a day (QID) | INTRAMUSCULAR | Status: DC | PRN

## 2017-02-05 MED ORDER — ACETAMINOPHEN 160 MG/5ML PO SOLN: 650.0000 mg | Freq: Once | ORAL | Status: DC

## 2017-02-05 MED ORDER — VANCOMYCIN HCL IN DEXTROSE 1-5 GM/200ML-% IV SOLN
1000.0000 mg | Freq: Once | INTRAVENOUS | Status: DC
  Filled 2017-02-05: qty 200

## 2017-02-05 MED ORDER — LIDOCAINE HCL (PF) 1 % IJ SOLN
INTRAMUSCULAR | Status: AC
  Filled 2017-02-05: qty 30

## 2017-02-05 MED ORDER — ACETAMINOPHEN 650 MG RE SUPP: 650.0000 mg | Freq: Once | RECTAL | Status: DC

## 2017-02-05 MED FILL — Medication: Qty: 1 | Status: AC

## 2017-02-05 MED FILL — Heparin Sodium (Porcine) Inj 1000 Unit/ML: INTRAMUSCULAR | Qty: 30 | Status: AC

## 2017-02-05 MED FILL — Magnesium Sulfate Inj 50%: INTRAMUSCULAR | Qty: 10 | Status: AC

## 2017-02-05 MED FILL — Potassium Chloride Inj 2 mEq/ML: INTRAVENOUS | Qty: 40 | Status: AC

## 2017-02-05 SURGICAL SUPPLY — 111 items
ADAPTER UNIV SWAN GANZ BIP (ADAPTER) ×1 IMPLANT
ADAPTER UNV SWAN GANZ BIP (ADAPTER) ×1
ADH SKN CLS APL DERMABOND .7 (GAUZE/BANDAGES/DRESSINGS) ×1
ADPR CATH UNV NS SG CATH (ADAPTER) ×1
BAG BANDED W/RUBBER/TAPE 36X54 (MISCELLANEOUS) ×2 IMPLANT
BAG DECANTER FOR FLEXI CONT (MISCELLANEOUS) IMPLANT
BAG EQP BAND 135X91 W/RBR TAPE (MISCELLANEOUS) ×1
BAG SNAP BAND KOVER 36X36 (MISCELLANEOUS) ×4 IMPLANT
BLADE CLIPPER SURG (BLADE) IMPLANT
BLADE OSCILLATING /SAGITTAL (BLADE) IMPLANT
BLADE STERNUM SYSTEM 6 (BLADE) ×2 IMPLANT
CABLE ADAPT CONN TEMP 6FT (ADAPTER) ×2 IMPLANT
CABLE PACING FASLOC BIEGE (MISCELLANEOUS) ×2 IMPLANT
CABLE PACING FASLOC BLUE (MISCELLANEOUS) ×1 IMPLANT
CANNULA FEM VENOUS REMOTE 22FR (CANNULA) IMPLANT
CANNULA OPTISITE PERFUSION 16F (CANNULA) IMPLANT
CANNULA OPTISITE PERFUSION 18F (CANNULA) IMPLANT
CATH DIAG EXPO 6F VENT PIG 145 (CATHETERS) ×4 IMPLANT
CATH EXPO 5FR AL1 (CATHETERS) ×3 IMPLANT
CATH S G BIP PACING (SET/KITS/TRAYS/PACK) ×4 IMPLANT
CLIP VESOCCLUDE MED 24/CT (CLIP) ×1 IMPLANT
CLIP VESOCCLUDE SM WIDE 24/CT (CLIP) ×1 IMPLANT
CONT SPEC 4OZ CLIKSEAL STRL BL (MISCELLANEOUS) ×9 IMPLANT
COVER BACK TABLE 24X17X13 BIG (DRAPES) ×2 IMPLANT
COVER BACK TABLE 60X90IN (DRAPES) ×2 IMPLANT
COVER BACK TABLE 80X110 HD (DRAPES) ×2 IMPLANT
COVER DOME SNAP 22 D (MISCELLANEOUS) ×2 IMPLANT
COVER MAYO STAND STRL (DRAPES) ×1 IMPLANT
COVER PROBE W GEL 5X96 (DRAPES) ×1 IMPLANT
CRADLE DONUT ADULT HEAD (MISCELLANEOUS) ×2 IMPLANT
DERMABOND ADVANCED (GAUZE/BANDAGES/DRESSINGS) ×1
DERMABOND ADVANCED .7 DNX12 (GAUZE/BANDAGES/DRESSINGS) ×1 IMPLANT
DEVICE CLOSURE PERCLS PRGLD 6F (VASCULAR PRODUCTS) ×2 IMPLANT
DRAPE INCISE IOBAN 66X45 STRL (DRAPES) IMPLANT
DRAPE SLUSH MACHINE 52X66 (DRAPES) ×2 IMPLANT
DRSG OPSITE POSTOP 3X4 (GAUZE/BANDAGES/DRESSINGS) ×1 IMPLANT
DRSG TEGADERM 4X4.75 (GAUZE/BANDAGES/DRESSINGS) ×2 IMPLANT
DRYSEAL FLEXSHEATH 16FR 33CM (SHEATH) ×1
ELECT REM PT RETURN 9FT ADLT (ELECTROSURGICAL) ×4
ELECTRODE REM PT RTRN 9FT ADLT (ELECTROSURGICAL) ×2 IMPLANT
FELT TEFLON 6X6 (MISCELLANEOUS) ×1 IMPLANT
FEMORAL VENOUS CANN RAP (CANNULA) IMPLANT
GAUZE SPONGE 4X4 12PLY STRL (GAUZE/BANDAGES/DRESSINGS) ×2 IMPLANT
GAUZE SPONGE 4X4 12PLY STRL LF (GAUZE/BANDAGES/DRESSINGS) ×2 IMPLANT
GLOVE BIO SURGEON STRL SZ8 (GLOVE) ×4 IMPLANT
GLOVE EUDERMIC 7 POWDERFREE (GLOVE) ×2 IMPLANT
GLOVE ORTHO TXT STRL SZ7.5 (GLOVE) ×4 IMPLANT
GOWN STRL REUS W/ TWL LRG LVL3 (GOWN DISPOSABLE) ×3 IMPLANT
GOWN STRL REUS W/ TWL XL LVL3 (GOWN DISPOSABLE) ×6 IMPLANT
GOWN STRL REUS W/TWL LRG LVL3 (GOWN DISPOSABLE) ×8
GOWN STRL REUS W/TWL XL LVL3 (GOWN DISPOSABLE) ×8
GUIDEWIRE AMPLATZ STIFF 0.35 (WIRE) ×1 IMPLANT
GUIDEWIRE SAF TJ AMPL .035X180 (WIRE) ×2 IMPLANT
GUIDEWIRE SAFE TJ AMPLATZ EXST (WIRE) ×2 IMPLANT
GUIDEWIRE STRAIGHT .035 260CM (WIRE) ×2 IMPLANT
INSERT FOGARTY 61MM (MISCELLANEOUS) ×1 IMPLANT
INSERT FOGARTY SM (MISCELLANEOUS) ×2 IMPLANT
INSERT FOGARTY XLG (MISCELLANEOUS) IMPLANT
KIT BASIN OR (CUSTOM PROCEDURE TRAY) ×2 IMPLANT
KIT DILATOR VASC 18G NDL (KITS) IMPLANT
KIT HEART LEFT (KITS) ×3 IMPLANT
KIT ROOM TURNOVER OR (KITS) ×2 IMPLANT
KIT SUCTION CATH 14FR (SUCTIONS) ×2 IMPLANT
NDL PERC 18GX7CM (NEEDLE) ×1 IMPLANT
NEEDLE PERC 18GX7CM (NEEDLE) ×2 IMPLANT
NS IRRIG 1000ML POUR BTL (IV SOLUTION) ×6 IMPLANT
PACK AORTA (CUSTOM PROCEDURE TRAY) ×2 IMPLANT
PAD ARMBOARD 7.5X6 YLW CONV (MISCELLANEOUS) ×4 IMPLANT
PAD ELECT DEFIB RADIOL ZOLL (MISCELLANEOUS) ×2 IMPLANT
PERCLOSE PROGLIDE 6F (VASCULAR PRODUCTS) ×4
SET MICROPUNCTURE 5F STIFF (MISCELLANEOUS) ×2 IMPLANT
SHEATH AVANTI 11CM 8FR (MISCELLANEOUS) ×2 IMPLANT
SHEATH DRYSEAL FLEX 16FR 33CM (SHEATH) IMPLANT
SHEATH PINNACLE 6F 10CM (SHEATH) ×4 IMPLANT
SLEEVE REPOSITIONING LENGTH 30 (MISCELLANEOUS) ×2 IMPLANT
SPONGE LAP 4X18 X RAY DECT (DISPOSABLE) ×1 IMPLANT
STOPCOCK MORSE 400PSI 3WAY (MISCELLANEOUS) ×9 IMPLANT
SUT ETHIBOND X763 2 0 SH 1 (SUTURE) ×1 IMPLANT
SUT GORETEX CV 4 TH 22 36 (SUTURE) ×1 IMPLANT
SUT GORETEX CV4 TH-18 (SUTURE) ×3 IMPLANT
SUT GORETEX TH-18 36 INCH (SUTURE) ×2 IMPLANT
SUT MNCRL AB 3-0 PS2 18 (SUTURE) ×1 IMPLANT
SUT PROLENE 3 0 SH1 36 (SUTURE) IMPLANT
SUT PROLENE 4 0 RB 1 (SUTURE)
SUT PROLENE 4-0 RB1 .5 CRCL 36 (SUTURE) ×1 IMPLANT
SUT PROLENE 5 0 C 1 36 (SUTURE) ×2 IMPLANT
SUT PROLENE 6 0 C 1 30 (SUTURE) ×2 IMPLANT
SUT SILK  1 MH (SUTURE) ×1
SUT SILK 1 MH (SUTURE) ×1 IMPLANT
SUT SILK 2 0 SH CR/8 (SUTURE) IMPLANT
SUT VIC AB 2-0 CT1 27 (SUTURE)
SUT VIC AB 2-0 CT1 TAPERPNT 27 (SUTURE) ×1 IMPLANT
SUT VIC AB 2-0 CTX 36 (SUTURE) IMPLANT
SUT VIC AB 3-0 SH 8-18 (SUTURE) ×2 IMPLANT
SYR 10ML LL (SYRINGE) ×6 IMPLANT
SYR 30ML LL (SYRINGE) ×4 IMPLANT
SYR 50ML LL SCALE MARK (SYRINGE) ×2 IMPLANT
SYR CONTROL 10ML LL (SYRINGE) ×1 IMPLANT
SYS DELIVERY ENVEO PRO 16FR (MISCELLANEOUS) ×2
SYS LOAD ENVEOPRO 26/29MMX34MM (MISCELLANEOUS) ×2
SYSTEM DELIVERY ENVEO PRO 16FR (MISCELLANEOUS) IMPLANT
SYSTEM LOAD ENVPR 26/29MMX34MM (MISCELLANEOUS) IMPLANT
TAPE CLOTH SURG 4X10 WHT LF (GAUZE/BANDAGES/DRESSINGS) ×1 IMPLANT
TOWEL OR 17X26 10 PK STRL BLUE (TOWEL DISPOSABLE) ×3 IMPLANT
TRANSDUCER W/STOPCOCK (MISCELLANEOUS) ×4 IMPLANT
TRAY FOLEY SILVER 16FR TEMP (SET/KITS/TRAYS/PACK) ×1 IMPLANT
TUBING HIGH PRESSURE 120CM (CONNECTOR) ×2 IMPLANT
VALVE AORTIC EVOLUT PRO 26MM (Prosthesis & Implant Heart) ×1 IMPLANT
WIRE AMPLATZ SS-J .035X180CM (WIRE) ×3 IMPLANT
WIRE BENTSON .035X145CM (WIRE) ×2 IMPLANT
WIRE MINI STICK MAX (SHEATH) IMPLANT

## 2017-02-05 SURGICAL SUPPLY — 60 items
ADH SKN CLS APL DERMABOND .7 (GAUZE/BANDAGES/DRESSINGS) ×2
BANDAGE ACE 4X5 VEL STRL LF (GAUZE/BANDAGES/DRESSINGS) IMPLANT
BANDAGE ESMARK 6X9 LF (GAUZE/BANDAGES/DRESSINGS) IMPLANT
BNDG CMPR 9X6 STRL LF SNTH (GAUZE/BANDAGES/DRESSINGS)
BNDG ESMARK 6X9 LF (GAUZE/BANDAGES/DRESSINGS)
CANISTER SUCT 3000ML PPV (MISCELLANEOUS) ×3 IMPLANT
CLIP VESOCCLUDE MED 24/CT (CLIP) ×3 IMPLANT
CLIP VESOCCLUDE SM WIDE 24/CT (CLIP) ×3 IMPLANT
CONNECTOR Y ATS VAC SYSTEM (MISCELLANEOUS) ×1 IMPLANT
CUFF TOURNIQUET SINGLE 24IN (TOURNIQUET CUFF) IMPLANT
CUFF TOURNIQUET SINGLE 34IN LL (TOURNIQUET CUFF) IMPLANT
CUFF TOURNIQUET SINGLE 44IN (TOURNIQUET CUFF) IMPLANT
DERMABOND ADVANCED (GAUZE/BANDAGES/DRESSINGS) ×1
DERMABOND ADVANCED .7 DNX12 (GAUZE/BANDAGES/DRESSINGS) ×2 IMPLANT
DRAIN CHANNEL 15F RND FF W/TCR (WOUND CARE) IMPLANT
DRAPE INCISE IOBAN 66X45 STRL (DRAPES) ×1 IMPLANT
DRAPE X-RAY CASS 24X20 (DRAPES) IMPLANT
DRESSING HYDROCOLLOID 4X4 (GAUZE/BANDAGES/DRESSINGS) ×2 IMPLANT
DRSG VAC ATS MED SENSATRAC (GAUZE/BANDAGES/DRESSINGS) ×1 IMPLANT
ELECT REM PT RETURN 9FT ADLT (ELECTROSURGICAL) ×3
ELECTRODE REM PT RTRN 9FT ADLT (ELECTROSURGICAL) ×2 IMPLANT
EVACUATOR SILICONE 100CC (DRAIN) IMPLANT
GLOVE BIOGEL PI IND STRL 7.5 (GLOVE) ×2 IMPLANT
GLOVE BIOGEL PI INDICATOR 7.5 (GLOVE) ×3
GLOVE ECLIPSE 7.0 STRL STRAW (GLOVE) ×1 IMPLANT
GLOVE ECLIPSE 7.5 STRL STRAW (GLOVE) ×1 IMPLANT
GLOVE SURG SS PI 7.5 STRL IVOR (GLOVE) ×3 IMPLANT
GOWN STRL REUS W/ TWL LRG LVL3 (GOWN DISPOSABLE) ×4 IMPLANT
GOWN STRL REUS W/ TWL XL LVL3 (GOWN DISPOSABLE) ×2 IMPLANT
GOWN STRL REUS W/TWL LRG LVL3 (GOWN DISPOSABLE) ×6
GOWN STRL REUS W/TWL XL LVL3 (GOWN DISPOSABLE) ×3
HEMOSTAT SNOW SURGICEL 2X4 (HEMOSTASIS) IMPLANT
KIT BASIN OR (CUSTOM PROCEDURE TRAY) ×3 IMPLANT
KIT ROOM TURNOVER OR (KITS) ×3 IMPLANT
MARKER GRAFT CORONARY BYPASS (MISCELLANEOUS) IMPLANT
NS IRRIG 1000ML POUR BTL (IV SOLUTION) ×6 IMPLANT
PACK PERIPHERAL VASCULAR (CUSTOM PROCEDURE TRAY) ×3 IMPLANT
PAD ARMBOARD 7.5X6 YLW CONV (MISCELLANEOUS) ×6 IMPLANT
SET COLLECT BLD 21X3/4 12 (NEEDLE) IMPLANT
SPONGE LAP 18X18 X RAY DECT (DISPOSABLE) ×3 IMPLANT
SPONGE TONSIL 1 RF SGL (DISPOSABLE) ×1 IMPLANT
STOPCOCK 4 WAY LG BORE MALE ST (IV SETS) IMPLANT
SUT ETHILON 3 0 PS 1 (SUTURE) IMPLANT
SUT PROLENE 5 0 C 1 24 (SUTURE) ×8 IMPLANT
SUT PROLENE 6 0 BV (SUTURE) ×6 IMPLANT
SUT PROLENE 7 0 BV 1 (SUTURE) IMPLANT
SUT SILK 0 TIES 10X30 (SUTURE) ×1 IMPLANT
SUT SILK 2 0 TIES 10X30 (SUTURE) ×1 IMPLANT
SUT SILK 3 0 (SUTURE)
SUT SILK 3-0 18XBRD TIE 12 (SUTURE) IMPLANT
SUT VIC AB 2-0 CT1 27 (SUTURE) ×6
SUT VIC AB 2-0 CT1 TAPERPNT 27 (SUTURE) ×4 IMPLANT
SUT VIC AB 3-0 SH 27 (SUTURE) ×3
SUT VIC AB 3-0 SH 27X BRD (SUTURE) ×4 IMPLANT
SUT VICRYL 4-0 PS2 18IN ABS (SUTURE) ×5 IMPLANT
TRAY FOLEY W/METER SILVER 16FR (SET/KITS/TRAYS/PACK) ×2 IMPLANT
TUBING EXTENTION W/L.L. (IV SETS) IMPLANT
UNDERPAD 30X30 (UNDERPADS AND DIAPERS) ×3 IMPLANT
WATER STERILE IRR 1000ML POUR (IV SOLUTION) ×3 IMPLANT
YANKAUER SUCT BULB TIP NO VENT (SUCTIONS) ×2 IMPLANT

## 2017-02-05 NOTE — Progress Notes (Signed)
Dr. Julianne Handler in unit.  Made aware of patient's status.  Into see patient.  MD felt that patient's left groin was different from when she was in OR.  Pressure held and [place] notified from Cath Lab to come and see patient.  Barbaraann Rondo took over holding pressure on patient.  Patient with noted hypotension.  PRBC's being infused emergently per orders of MD.  Patient moaning and restless.  Continuing to monitor patient.

## 2017-02-05 NOTE — OR Nursing (Signed)
SICU charge call by Fenton Foy RN for on the way

## 2017-02-05 NOTE — Progress Notes (Signed)
Prior to going back to OR, patient received 8 units of PRBC's and 2 FFP emergently as per orders.  Please see green blood product identification tags in patient shadow chart.

## 2017-02-05 NOTE — Transfer of Care (Signed)
Immediate Anesthesia Transfer of Care Note  Patient: Carrie Monroe  Procedure(s) Performed: Procedure(s): Evacuation of Retroperitoneal Hematoma and Primary Repair of Femoral Artery and FEMORAL ARTERY EXPLORATION (N/A) APPLICATION OF WOUND VAC (Left)  Patient Location: SICU  Anesthesia Type:General  Level of Consciousness: comatose and Patient remains intubated per anesthesia plan  Airway & Oxygen Therapy: Patient remains intubated per anesthesia plan and Patient placed on Ventilator (see vital sign flow sheet for setting)  Post-op Assessment: Report given to RN and Post -op Vital signs reviewed and stable  Post vital signs: Reviewed and stable  Last Vitals:  Vitals:   02/05/17 1300 02/05/17 1513  BP: 110/65   Pulse: (!) 129   Resp: (!) 30   Temp:    SpO2: 99% 98%    Last Pain:  Vitals:   02/05/17 1045  TempSrc: Oral      Patients Stated Pain Goal: 3 (51/46/04 7998)  Complications: No apparent anesthesia complications

## 2017-02-05 NOTE — Progress Notes (Signed)
  Echocardiogram 2D Echocardiogram has been performed.  Carrie Monroe 02/05/2017, 10:55 AM

## 2017-02-05 NOTE — Progress Notes (Signed)
Patient continue to have low blood pressure.  Dr. Julianne Handler and Dr. Haroldine Laws and Dr. Roxy Manns all in room with patient.  Blood continue to be given emergently per rapid infuser at this time.  Barbaraann Rondo still holding pressure on left groin.  Patient immediately taken to OR for emergent re-exploration of left groin per Dr. Trula Slade.

## 2017-02-05 NOTE — Progress Notes (Signed)
RT note-Ventilator changes post ABG

## 2017-02-05 NOTE — Anesthesia Procedure Notes (Signed)
Arterial Line Insertion Start/End9/03/2017 6:55 AM, 02/05/2017 6:50 AM Performed by: Mariea Clonts, CRNA  Patient location: Pre-op. Preanesthetic checklist: patient identified, IV checked, site marked, risks and benefits discussed, surgical consent, monitors and equipment checked, pre-op evaluation, timeout performed and anesthesia consent Lidocaine 1% used for infiltration and patient sedated Right, radial was placed Catheter size: 18 G Hand hygiene performed  and maximum sterile barriers used   Attempts: 1 Procedure performed without using ultrasound guided technique. Following insertion, dressing applied and Biopatch. Post procedure assessment: normal  Patient tolerated the procedure well with no immediate complications.

## 2017-02-05 NOTE — CV Procedure (Signed)
[facility]  TAVR OPERATIVE NOTE   Date of Procedure:  02/05/2017  Preoperative Diagnosis: Severe Aortic Stenosis   Postoperative Diagnosis: Same   Procedure:    Transcatheter Aortic Valve Replacement - Transfemoral Approach  Medtronic Evolut Pro 26 mm Serial no.: [id number] Model/Cat no.: [id number]    Co-Surgeons:  Lauree Chandler, MD and Valentina Gu. Roxy Manns, MD   Anesthesiologist:  Ola Spurr  Echocardiographer:  Johnsie Cancel  Pre-operative Echo Findings:  Severe aortic stenosis  Normal left ventricular systolic function  Post-operative Echo Findings:  Trivial paravalvular leak  Normal left ventricular systolic function  BRIEF CLINICAL NOTE AND INDICATIONS FOR SURGERY  81 year old obese white female with history of aortic stenosis, coronary artery disease, hypertension, stage III chronic kidney disease, type 2 diabetes mellitus with complications, hyperlipidemia, gout, and breast cancer status post lumpectomy with radiation therapy, severe right carotid stenosis here today for TAVR. Followed for several years by Dr. Recardo Evangelist for aortic stenosis. Over the past 6-8 months patient has developed progressive exertional fatigue and shortness of breath without any associated symptoms of chest tightness or chest pressure. Follow-up echocardiogram performed 09/04/2016 revealed severe aortic stenosis with normal left ventricular systolic function. She was referred to the multidisciplinary valve clinic and underwent diagnostic cardiac catheterization on 12/17/2016 which confirmed the presence of severe aortic stenosis and was notable for severe multivessel coronary artery disease. She was seen in consultation by Dr. Cyndia Bent and options were discussed including conventional surgical aortic valve replacement with coronary artery bypass grafting versus multivessel PCI and stenting with delayed transcatheter aortic valve replacement. CT angiography was performed to further evaluate  the feasibility of transcatheter aortic valve replacement, and the patient underwent successful PCI and stenting of the left anterior descending coronary artery and the circumflex artery on 12/28/2016.   During the course of the patient's preoperative work up they have been evaluated comprehensively by a multidisciplinary team of specialists coordinated through the [facility] in the [facility].  They have been demonstrated to suffer from symptomatic severe aortic stenosis as noted above. The patient has been counseled extensively as to the relative risks and benefits of all options for the treatment of severe aortic stenosis including long term medical therapy, conventional surgery for aortic valve replacement, and transcatheter aortic valve replacement.  The patient has been independently evaluated by two cardiac surgeons including Dr Roxy Manns and Dr. Cyndia Bent, and they are felt to be at high risk for conventional surgical aortic valve replacement. Both surgeons indicated the patient would be a poor candidate for conventional surgery. Based upon review of all of the patient's preoperative diagnostic tests they are felt to be candidate for transcatheter aortic valve replacement using the transfemoral approach as an alternative to high risk conventional surgery.    Following the decision to proceed with transcatheter aortic valve replacement, a discussion has been held regarding what types of management strategies would be attempted intraoperatively in the event of life-threatening complications, including whether or not the patient would be considered a candidate for the use of cardiopulmonary bypass and/or conversion to open sternotomy for attempted surgical intervention.  The patient has been advised of a variety of complications that might develop peculiar to this approach including but not limited to risks of death, stroke, paravalvular leak, aortic dissection  or other major vascular complications, aortic annulus rupture, device embolization, cardiac rupture or perforation, acute myocardial infarction, arrhythmia, heart block or bradycardia requiring permanent pacemaker placement, congestive heart failure, respiratory failure, renal failure, pneumonia,  infection, other late complications related to structural valve deterioration or migration, or other complications that might ultimately cause a temporary or permanent loss of functional independence or other long term morbidity.  The patient provides full informed consent for the procedure as described and all questions were answered preoperatively.    DETAILS OF THE OPERATIVE PROCEDURE  PREPARATION:   The patient is brought to the operating room on the above mentioned date and central monitoring was established by the anesthesia team including placement of a radial arterial line. The patient is placed in the supine position on the operating table.  Intravenous antibiotics are administered. Conscious sedation is used.   Baseline transesophageal echocardiogram was performed. [name] chest, abdomen, both groins, and both lower extremities are prepared and draped in a sterile manner. A time out procedure is performed.   PERIPHERAL ACCESS:   Using the modified Seldinger technique, femoral arterial and venous access were obtained with placement of 6 Fr sheaths on the right side.  A pigtail diagnostic catheter was passed through the femoral arterial sheath under fluoroscopic guidance into the aortic root.  A temporary transvenous pacemaker catheter was passed through the femoral venous sheath under fluoroscopic guidance into the right ventricle.  The pacemaker was tested to ensure stable lead placement and pacemaker capture.   TRANSFEMORAL ACCESS:  A micropuncture kit was used to gain access to the left femoral artery. Position confirmed with angiography. Pre-closure with double ProGlide closure devices.  The patient was heparinized systemically and ACT verified > 250 seconds.    A 16 French Gore Dryseal sheath was then advanced over an [employer] Stiff wire after first dilating with a 16 [place] dilator. An AL-1 catheter was used to direct a straight tip exchange wire across the native aortic valve into the left ventricle. This was exchanged out for a pigtail catheter and position confirmed in the LV apex. The pigtail catheter was then exchanged for a Confida wire. Aortic root angiography confirmed that the pigtail was in the non coronary cusp.   A Medtronic Evolut Pro 26 mm valve was then prepared and loaded per manufacturer's guidelines, and the proper orientation of the valve is confirmed on the [facility] Pro delivery system. The 42 French sheath was removed and the delivery system was then advanced over the wire, utilizing the in-line sheath. The valve was carefully positioned across the aortic valve annulus. The sheath was then retracted slowly while pacing at 120 bpm. TTE confirmed appropriate location of the valve. The valve was then released from the delivery system. The delivery system and wire were removed. There is felt to be trivial paravalvular leak and no central aortic insufficiency.  [name] hemodynamic recovery following valve deployment is good.  Echo demostrated acceptable post-procedural gradients, stable mitral valve function, and trivial AI.    PROCEDURE COMPLETION:  The sheath was then removed and closure devices were completed. Protamine was administered once femoral arterial repair was complete. I placed an 8 [place] Angioseal due to inadequate hemostasis with the ProGlide closure devices. The temporary pacemaker, pigtail catheters and femoral sheaths were removed with manual pressure used for hemostasis.   The patient tolerated the procedure well and is transported to the surgical intensive care in stable condition. There were no immediate intraoperative complications. All  sponge instrument and needle counts are verified correct at completion of the operation.   No blood products were administered during the operation.  The patient received a total of 39 mL of intravenous contrast during the procedure.  Lauree Chandler MD 02/05/2017 10:12 AM

## 2017-02-05 NOTE — Consult Note (Signed)
Advanced Heart Failure Team Consult Note   Referring Cardiologist:  McAlhany  Reason for Consultation: Hemorrhagic Shock  HPI:    Carrie Monroe is seen today for evaluation of hemorrhagic shock at the request of Dr Angelena Form.  Carrie Monroe is a 81 year old with history of AS, CAD, HTN, CKD Stage III, DMII, hyperlipidemia admitted for TAVR.   Earlier today she had TAVR. When 16 french cath was removed pre close suture became dislodged. She required angio seal and manual pressure in the OR to achieve hemostasis. Initially hemodynamically stable but left groin was indurated. Manual pressure was held > 60 minutes with no improvement. Vascular Surgeon assessed. Distal pulses ok but hemoglobin dropped and BP dropped continue to fall so pressors started.Also developed chest pain and respiratory failure requiring urgent intubation. Limited ECHO performed by Dr Marlou Porch with normal EF and evidence of dissection. Placed vasopressin, neo, and norepinephrine.Hemoglobin dropped from 10>6.1. Received 8UPRBCs. BP dropped vasopressin, neo, and norepinephrine. Persistently hypotensive so epinephrine was added. Received 2UFFP.    Review of Systems: [y] = yes, [ ]  = no Patient is encephalopathic and or intubated. Therefore history has been obtained from chart review.   General: Weight gain [ ] ; Weight loss [ ] ; Anorexia [ ] ; Fatigue [ ] ; Fever [ ] ; Chills [ ] ; Weakness [ ]   Cardiac: Chest pain/pressure [ ] ; Resting SOB [ ] ; Exertional SOB [ ] ; Orthopnea [ ] ; Pedal Edema [ ] ; Palpitations [ ] ; Syncope [ ] ; Presyncope [ ] ; Paroxysmal nocturnal dyspnea[ ]   Pulmonary: Cough [ ] ; Wheezing[ ] ; Hemoptysis[ ] ; Sputum [ ] ; Snoring [ ]   GI: Vomiting[ ] ; Dysphagia[ ] ; Melena[ ] ; Hematochezia [ ] ; Heartburn[ ] ; Abdominal pain [ ] ; Constipation [ ] ; Diarrhea [ ] ; BRBPR [ ]   GU: Hematuria[ ] ; Dysuria [ ] ; Nocturia[ ]   Vascular: Pain in legs with walking [ ] ; Pain in feet with lying flat [ ] ; Non-healing sores [ ] ;  Stroke [ ] ; TIA [ ] ; Slurred speech [ ] ;  Neuro: Headaches[ ] ; Vertigo[ ] ; Seizures[ ] ; Paresthesias[ ] ;Blurred vision [ ] ; Diplopia [ ] ; Vision changes [ ]   Ortho/Skin: Arthritis [ ] ; Joint pain [ ] ; Muscle pain [ ] ; Joint swelling [ ] ; Back Pain [ ] ; Rash [ ]   Psych: Depression[ ] ; Anxiety[ ]   Heme: Bleeding problems [ ] ; Clotting disorders [ ] ; Anemia [ ]   Endocrine: Diabetes [ ] ; Thyroid dysfunction[ ]   Home Medications Prior to Admission medications   Medication Sig Start Date End Date Taking? Authorizing Provider  aspirin 81 MG chewable tablet Chew 1 tablet (81 mg total) by mouth daily. 12/23/16  Yes Kilroy, Doreene Burke, PA-C  Cholecalciferol (VITAMIN D PO) Take 1 capsule by mouth daily.   Yes [provider]  clopidogrel (PLAVIX) 75 MG tablet Take 1 tablet (75 mg total) by mouth daily with breakfast. 12/30/16  Yes Eileen Stanford, PA-C  furosemide (LASIX) 40 MG tablet Take 1 tablet (40 mg total) by mouth daily. Take half tablet by mouth daily as needed for swelling Patient taking differently: Take 40 mg by mouth daily as needed for fluid.  12/22/16  Yes Kilroy, Luke K, PA-C  hydrALAZINE (APRESOLINE) 50 MG tablet Take 1 tablet (50 mg total) by mouth every 8 (eight) hours. Patient taking differently: Take 50 mg by mouth 2 (two) times daily.  01/18/17  Yes Burnell Blanks, MD  metoprolol tartrate (LOPRESSOR) 25 MG tablet Take 1 tablet (25 mg total) by mouth 2 (two) times  daily. 12/22/16  Yes Kilroy, Lurena Joiner K, PA-C  rosuvastatin (CRESTOR) 10 MG tablet Take 10 mg by mouth daily.  01/17/16  Yes [provider]  tamoxifen (NOLVADEX) 20 MG tablet Take 1 tablet by mouth every morning. 03/26/16  Yes Nicholas Lose, MD  allopurinol (ZYLOPRIM) 300 MG tablet Take 300 mg by mouth daily as needed (gout).    [provider]  colchicine 0.6 MG tablet Take 0.6 mg by mouth every 8 (eight) hours as needed (gout flare).    [provider]    Past Medical History: Past  Medical History:  Diagnosis Date  . Aortic stenosis, severe   . Arthritis   . Breast cancer ([place])    a. L breast cancer s/p lumpectomy and XRT  . Carotid artery stenosis    a. 11/2016: 80-99% RICA stenosis, 1-39% vs low range 69-79% LICA   . Chronic diastolic CHF (congestive heart failure) ([place])   . CKD (chronic kidney disease)   . Coronary artery disease    a. 11/2016: diagnosed with multivessel CAD, turned down for CABG and underwent PCI/DES to mLCx, PCI/DES to mLAD and PCTA of ostial diagonal on 12/28/16  . Diabetes mellitus    a. diet controlled   . Dyspnea    on exertion  . Exogenous obesity   . Gout   . Hemorrhoids   . History of kidney stones   . Hyperlipidemia   . Hypertension     Past Surgical History: Past Surgical History:  Procedure Laterality Date  . BREAST LUMPECTOMY  2009   left  . BREAST LUMPECTOMY WITH NEEDLE LOCALIZATION Left 01/06/2015   Procedure: BREAST BIOPSY WITH NEEDLE LOCALIZATION AND SKIN BIOPSY;  Surgeon: Autumn Messing III, MD;  Location: [facility];  Service: General;  Laterality: Left;  . CATARACT EXTRACTION    . CHOLECYSTECTOMY  2010  . CORONARY STENT INTERVENTION N/A 12/28/2016   Procedure: Coronary Stent Intervention;  Surgeon: Burnell Blanks, MD;  Location: [facility];  Service: Cardiovascular;  Laterality: N/A;  . EYE SURGERY     BILATERAL CATARACT EXTRACTIONS AND LENS IMPLANTS  . KNEE SURGERY     right PARTIAL KNEE REPLACEMENT  . LEFT HEART CATH AND CORONARY ANGIOGRAPHY N/A 12/17/2016   Procedure: Left Heart Cath and Coronary Angiography;  Surgeon: Burnell Blanks, MD;  Location: [facility];  Service: Cardiovascular;  Laterality: N/A;  . MULTIPLE EXTRACTIONS WITH ALVEOLOPLASTY N/A 12/21/2016   Procedure: Extraction of tooth #'s 12, 23,24,25,26 and 29 with alveoloplasty and gross debridement of remaining teeth.;  Surgeon: Lenn Cal, DDS;  Location: [facility];  Service: Oral Surgery;  Laterality: N/A;    . SHOULDER SURGERY     right  . SKIN GRAFT TO RIGHT HAND     AFTER BURN TO THE HAND  . TOTAL KNEE ARTHROPLASTY Left 08/03/2013   Procedure: TOTAL LEFT KNEE ARTHROPLASTY;  Surgeon: Gearlean Alf, MD;  Location: [facility];  Service: Orthopedics;  Laterality: Left;  . US ECHOCARDIOGRAPHY  05/15/2010   EF 60-65%    Family History: Family History  Problem Relation Age of Onset  . Cancer Mother        pancreatic  . Heart disease Father   . Hypertension Father   . Cancer Brother   . Diabetes Sister     Social History: Social History   Social History  . Marital status: Married    Spouse name: N/A  . Number of children: 4  . Years  of education: N/A   Occupational History  . 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 Smoking status: Never Smoker  . Smokeless tobacco: Never Used  . Alcohol use No  . Drug use: No  . Sexual activity: Not Currently   Other Topics Concern  . None   Social History Narrative   Epworth Sleepiness scale score =11 as of 01/25/16    Allergies:  No Known Allergies  Objective:    Vital Signs:   Temp:  [94.4 F (34.7 C)-97.9 F (36.6 C)] 94.4 F (34.7 C) (09/11 1045) Pulse Rate:  [39-185] 130 (09/11 1245) Resp:  [16-28] 28 (09/11 1245) BP: (72-167)/(36-48) 72/36 (09/11 1245) SpO2:  [97 %-100 %] 100 % (09/11 1245) Arterial Line BP: (78-97)/(22-29) 78/22 (09/11 1100) FiO2 (%):  [100 %] 100 % (09/11 1245) Weight:  [246 lb (111.6 kg)] 246 lb (111.6 kg) (09/11 0609)    Weight change: Filed Weights   02/05/17 0609  Weight: 246 lb (111.6 kg)    Intake/Output:   Intake/Output Summary (Last 24 hours) at 02/05/17 1302 Last data filed at 02/05/17 1145  Gross per 24 hour  Intake             1415 ml  Output              500 ml  Net              915 ml      Physical Exam   . General:  Intubated/Sedated Pale HEENT: normal Neck: supple. JVP not elevated. . Carotids 2+ bilat; no bruits. No lymphadenopathy or  thyromegaly appreciated. Cor: PMI nondisplaced. Tachy Regular rate & rhythm. No rubs, gallops or murmurs. Lungs: clear Abdomen: soft, nondistended. No hepatosplenomegaly. No bruits or masses. Good bowel sounds. Extremities: no cyanosis, clubbing, rash, edema. L groin induration  Neuro: Intubated/sedated  t   Telemetry   Sinus Tach 120-130s  EKG    Sinus Tach 129 bpm persnally reviewed   Labs   Basic Metabolic Panel:  Recent Labs Lab 02/01/17 1048 02/05/17 0755 02/05/17 0910 02/05/17 0956 02/05/17 1044  NA 139 142 142 141 139  K 4.0 3.7 3.9 3.9 3.9  CL 108 108 107 106  --   CO2 21*  --   --   --   --   GLUCOSE 165* 189* 253* 257* 242*  BUN 38* 34* 35* 34*  --   CREATININE 1.41* 1.50* 1.50* 1.50*  --   CALCIUM 9.6  --   --   --   --     Liver Function Tests:  Recent Labs Lab 02/01/17 1048  AST 21  ALT 15  ALKPHOS 59  BILITOT 0.7  PROT 6.8  ALBUMIN 3.6   No results for input(s): LIPASE, AMYLASE in the last 168 hours. No results for input(s): AMMONIA in the last 168 hours.  CBC:  Recent Labs Lab 02/01/17 1048  02/05/17 0910 02/05/17 0956 02/05/17 1044 02/05/17 1046 02/05/17 1242  WBC 7.0  --   --   --   --  7.1 18.5*  HGB 10.0*  < > 7.8* 6.8* 6.1* 7.9* 14.2  HCT 31.1*  < > 23.0* 20.0* 18.0* 23.7* 44.2  MCV 95.4  --   --   --   --  95.6 93.8  PLT 142*  --   --   --   --  105* 82*  < > = values in this interval not displayed.  Cardiac  Enzymes: No results for input(s): CKTOTAL, CKMB, CKMBINDEX, TROPONINI in the last 168 hours.  BNP: BNP (last 3 results) No results for input(s): BNP in the last 8760 hours.  ProBNP (last 3 results) No results for input(s): PROBNP in the last 8760 hours.   CBG:  Recent Labs Lab 02/01/17 0948  GLUCAP 171*    Coagulation Studies:  Recent Labs  02/05/17 1046  LABPROT 17.4*  INR 1.43     Imaging   Dg Chest Port 1 View  Result Date: 02/05/2017 CLINICAL DATA:  Status post TAVR. EXAM: PORTABLE  CHEST 1 VIEW COMPARISON:  Chest x-ray dated February 01, 2017. FINDINGS: Interval TAVR. Right internal jugular central venous catheter with the tip projecting over the distal SVC. Stable cardiomegaly. Mild pulmonary vascular congestion. Atherosclerotic calcification of the aortic arch. Bibasilar atelectasis. No pneumothorax or pleural effusion. No acute osseous abnormality. IMPRESSION: Interval TAVR. Mild pulmonary vascular congestion with bibasilar atelectasis. Electronically Signed   By: Titus Dubin M.D.   On: 02/05/2017 10:56      Medications:     Current Medications: . [START ON 02/06/2017] acetaminophen  1,000 mg Oral Q6H   Or  . [START ON 02/06/2017] acetaminophen (TYLENOL) oral liquid 160 mg/5 mL  1,000 mg Per Tube Q6H  . acetaminophen (TYLENOL) oral liquid 160 mg/5 mL  650 mg Per Tube Once   Or  . acetaminophen  650 mg Rectal Once  . [START ON 02/06/2017] aspirin  81 mg Oral Daily  . [START ON 02/06/2017] clopidogrel  75 mg Oral Q breakfast  . [START ON 02/06/2017] furosemide  40 mg Oral Daily  . hydrALAZINE  50 mg Oral BID  . [START ON 02/06/2017] metoprolol tartrate  25 mg Oral BID  . [START ON 02/07/2017] pantoprazole  40 mg Oral Daily  . [START ON 02/06/2017] rosuvastatin  10 mg Oral Daily  . [START ON 02/06/2017] tamoxifen  20 mg Oral q morning - 10a     Infusions: . sodium chloride    . sodium chloride    . sodium chloride    . sodium chloride    . sodium chloride    . sodium chloride    . albumin human    . cefUROXime (ZINACEF)  IV    . dexmedetomidine (PRECEDEX) IV infusion    . famotidine (PEPCID) IV    . lactated ringers    . norepinephrine (LEVOPHED) Adult infusion    . phenylephrine (NEO-SYNEPHRINE) Adult infusion    . vancomycin         Patient Profile   Carrie Monroe is a 81 year old with history of AS, CAD, HTN, CKD Stage III, DMII, hyperlipidemia admitted for TAVR.   Post op developed hypotension. Concern for hemorrhagic shock    Assessment/Plan     1. AS- S/P TAVR 2. Hemorrhagic Shock -SBP in the 50s. Hgb down from 10>6. Received multiple blood products. On Neo/Norepi/Vasopressin/Epi.  3 Acute Respiratory Failure - Urgent intubation 9/11/20118  She was taken urgently to the OR for exploration by Dr Radene Gunning.    Length of Stay: 0  Darrick Grinder, NP  02/05/2017, 1:02 PM  Advanced Heart Failure Team Pager [phone] (M-F; 7a - 4p)  Please contact [facility] for night-coverage after hours (4p -7a ) and weekends on [email or web address]  On my arrival patient in ICU. Intubated on multiple pressors with expanding left groin hematoma.SBP 60-70s. Multiple providers assisting with care. Patient started on epi drip with multiple boluses of epi and bicarb to support pressure.  Had already received 2 u RBCs. We called for 2 more units RBCs and 2 FFP which were infused with rapid infusor. Patient continued to deteriorate.  On exam  Obese pale intuabted Cor tachy regular Lungs mechanical BS Ab obese with expanding hematoma Ext pale cool   Taken emergently to OR and we continued to support her resuscitation while she was being prepped. Surgery performed by Drs. Roxy Manns and [place] to have large hole in femoral artery with profuse ongoing bleeding. Femoral artery defect repaired and hematoma evacuated.   CRITICAL CARE Performed by: Glori Bickers  Total critical care time: 90 minutes  Critical care time was exclusive of separately billable procedures and treating other patients.  Critical care was necessary to treat or prevent imminent or life-threatening deterioration.  Critical care was time spent personally by me (independent of midlevel providers or residents) on the following activities: development of treatment plan with patient and/or surrogate as well as nursing, discussions with consultants, evaluation of patient's response to treatment, examination of patient, obtaining history from patient or surrogate, ordering and performing  treatments and interventions, ordering and review of laboratory studies, ordering and review of radiographic studies, pulse oximetry and re-evaluation of patient's condition.  Glori Bickers, MD  3:31 PM

## 2017-02-05 NOTE — Progress Notes (Signed)
  Echocardiogram 2D Echocardiogram Limited S/P TAVR has been performed.  Darlina Sicilian M 02/05/2017, 1:22 PM

## 2017-02-05 NOTE — Op Note (Signed)
    Patient name: Carrie Monroe MRN: [id number] DOB: 12-01-1935 Sex: female  02/05/2017 Pre-operative Diagnosis: Left retroperitoneal hematoma with bleeding from left femoral artery Post-operative diagnosis:  Same Surgeon:  [name] Co--surgeon:  [place] Asst.: Gerri Lins Procedure:   #1 evacuation of left retroperitoneal hematoma   #2: Primary repair of left femoral artery ruptured pseudoaneurysm    #3: Placement of wound VAC Anesthesia:  Gen. Blood Loss:  See anesthesia record Specimens:  None  Findings:  Arteriotomy from the sheath on the anterior surface of the common femoral artery just above the bifurcation was repaired primarily.  Large retroperitoneal hematoma, successfully evacuated  Indications:  The patient underwent TAVR earlier today.  There was difficulty with closure of the left common femoral artery.  The patient had been stable in the ICU, however became progressively more hypotensive and was then urgently rushed to the operating room.  Procedure:  The patient was identified in the holding area and taken to [place] [facility]  The patient was then placed supine on the table. general anesthesia was administered.  The patient was prepped and draped in the usual sterile fashion.  A time out was called and antibiotics were administered.  When I arrived to the operating room, Dr. Roxy Manns had already made a longitudinal incision in the left groin and had divided the subcutaneous tissue anterior to the inguinal ligament.  Retractors were in place.  There was a significant amount of bleeding which was being controlled with a sponge stick.  Upon additional sharp dissection, the common femoral artery was able to be isolated above the epigastric branches and encircled with a vessel loop.  This gave [place] proximal control.  Blunt dissection was used to help expose the femoral bifurcation.  I passed a right angle and between the superficial femoral and profunda femoral artery.  This  didn't injure the sidewall of the profunda and superficial femoral artery.  I was able to get control of the vessels with a [place] clamp.  I then placed a 5-0 Prolene suture on either side of the arteriotomy in the common femoral artery and closed the artery transversely.  I then used horizontal mattress 5-0 Prolene to close the defect in the profunda and the superficial femoral artery.  At this point, the wound was hemostatic.  Hand-held Doppler identified a multiphasic signal in the common femoral profunda femoral and superficial femoral arteries.  The patient also had brisk Doppler signals at the foot.  At this point we continue to wash out the wound and evacuated a significant amount of the hematoma in the retroperitoneum.  The wound was then copiously irrigated.  I elected to use a wound VAC for closure.  I used a 2-0 Vicryl to reapproximate the subcutaneous tissue in multiple layers over top of the common femoral artery and then used a wound VAC to cover the wound.  This was connected to suction.  The patient will return to the ICU.   Disposition:  To ICU in critical condition   V. [name], M.D. [facility] Office: [phone] Pager:  [phone]

## 2017-02-05 NOTE — Care Management Note (Addendum)
Case Management Note  Patient Details  Name: Carrie Monroe MRN: [id number] Date of Birth: Apr 09, 1936  Subjective/Objective:  From home, presents with severe aortic stenosis, for  TAVR.    9/12 [place], BSN - in [place]  patient having hypotension and tachycardia, requiring pressors , hbg down, received transfusion (8 units prbc) 2 units FFP, chest pain and resp distress, left groin bleed , required intubation and exploratory evacuation of left retroperitoneal hematoma with wound vac.   9/14 [place], BSN - Evacuation of retroperitoneal hematoma and repair of femoral artery exploration, hs severe thrombocytopenia, wound vac removed, to return to OR for re-exploration.    [place], BSN - TAVR, Acute Resp failure, Hemorrhagic shock- evacuation of retroperitoneal hematoma, s/p re-exploration , JP drain to suction, per Cards note, she will need a R CEA after she recovers from TAVR.  Awaiting pt eval.               Action/Plan: NCM will follow for dc needs.   Expected Discharge Date:                  Expected Discharge Plan:     In-House Referral:     Discharge planning Services  CM Consult  Post Acute Care Choice:    Choice offered to:     DME Arranged:    DME Agency:     HH Arranged:    HH Agency:     Status of Service:  In process, will continue to follow  If discussed at Long Length of Stay Meetings, dates discussed:    Additional Comments:  [name], RN 02/05/2017, 1:03 PM

## 2017-02-05 NOTE — Progress Notes (Signed)
Patient now complaining of chest pain 8/10 and moaning and restless.  Dr. Julianne Handler renotified and in unit immediately.  Patient with continued low blood pressure.  Ht. Rate tachy 120's/130's.  Barbaraann Rondo continuing to hold pressure on left groin.  Neosynephrine, Dopamine and Levophed quad strength all infusing as per orders of Dr. Julianne Handler.  Continuing to infuse PRBC's emergently.

## 2017-02-05 NOTE — Progress Notes (Signed)
Patient with continued low blood pressure despite 6 units of PRBC's and pressors infusing.  Continuing to moan and c/o chest pain and having problems breathing.  Dr. Julianne Handler in unit with patient.  Please see code sheet for meds given.  Labs sent on patient.

## 2017-02-05 NOTE — H&P (View-Only) (Signed)
[facility] CONSULTATION REPORT  Referring Provider is Burnell Blanks*, MD Primary Cardiologist is Croitoru, Dani Gobble, MD PCP is Marton Redwood, MD  Chief Complaint  Patient presents with  . Aortic Stenosis    2nd TAVR eval, review all studies    HPI:  Patient is a 81 year old obese white female with history of aortic stenosis, coronary artery disease, hypertension, stage III chronic kidney disease, type 2 diabetes mellitus with complications, hyperlipidemia, gout, and breast cancer status post lumpectomy with radiation therapy who has been referred for second surgical opinion to discuss treatment options for management of severe symptomatic aortic stenosis. The patient has been known to have a heart murmur for several years and has been followed for the last few years by Dr. Sallyanne Kuster for aortic stenosis. Over the past 6-8 months patient has developed progressive exertional fatigue and shortness of breath without any associated symptoms of chest tightness or chest pressure. Follow-up echocardiogram performed 09/04/2016 revealed severe aortic stenosis with normal left ventricular systolic function. She was referred to the multidisciplinary valve clinic and underwent diagnostic cardiac catheterization by Dr. Angelena Form 12/17/2016 which confirmed the presence of severe aortic stenosis and was notable for severe multivessel coronary artery disease. She was seen in consultation by Dr. Cyndia Bent and options were discussed including conventional surgical aortic valve replacement with coronary artery bypass grafting versus multivessel PCI and stenting with delayed transcatheter aortic valve replacement. CT angiography was performed to further evaluate the feasibility of transcatheter aortic valve replacement, and the patient underwent successful PCI and stenting of the left anterior descending coronary artery and the right  coronary artery on 12/28/2016 by Dr.McAlhany.  The patient returns to the office today for second surgical consultation.  The patient is married and lives with her husband in [place]. Her husband is reportedly quite ill and essentially an invalid. The patient serves as his primary care giver.  She has been retired for 12 years but now spends all of her days looking after her husband. She has remained reasonably active physically although she admits that she has never exercise much and she has been overweight for most of her adult life. Her activity and mobility is limited primarily by exertional shortness of breath which has progressed over the past 6-8 months. She now gets short of breath with moderate low level activity, such as just walking 50-75 feet and ordinary pace. She denies any history of resting shortness breath, PND, orthopnea, palpitations, dizzy spells, or syncope. She has never had any chest pain or chest tightness either with activity or at rest. She did not experience any improvement in her symptoms after she underwent PCI and stenting.   Past Medical History:  Diagnosis Date  . Aortic stenosis, severe   . Breast cancer ([place])    a. L breast cancer s/p lumpectomy and XRT  . Carotid artery stenosis    a. 11/2016: 80-99% RICA stenosis, 1-39% vs low range 69-45% LICA   . Chronic diastolic CHF (congestive heart failure) ([place])   . CKD (chronic kidney disease)   . Coronary artery disease    a. 11/2016: diagnosed with multivessel CAD, turned down for CABG and underwent PCI/DES to mLCx, PCI/DES to mLAD and PCTA of ostial diagonal on 12/28/16  . Diabetes mellitus    a. diet controlled   . Exogenous obesity   . Gout   . Hemorrhoids   . History of kidney stones   . Hyperlipidemia   .  Hypertension     Past Surgical History:  Procedure Laterality Date  . BREAST LUMPECTOMY  2009   left  . BREAST LUMPECTOMY WITH NEEDLE LOCALIZATION Left 01/06/2015   Procedure: BREAST BIOPSY WITH  NEEDLE LOCALIZATION AND SKIN BIOPSY;  Surgeon: Autumn Messing III, MD;  Location: [facility];  Service: General;  Laterality: Left;  . CATARACT EXTRACTION    . CHOLECYSTECTOMY  2010  . CORONARY STENT INTERVENTION N/A 12/28/2016   Procedure: Coronary Stent Intervention;  Surgeon: Burnell Blanks, MD;  Location: [facility];  Service: Cardiovascular;  Laterality: N/A;  . EYE SURGERY     BILATERAL CATARACT EXTRACTIONS AND LENS IMPLANTS  . KNEE SURGERY     right PARTIAL KNEE REPLACEMENT  . LEFT HEART CATH AND CORONARY ANGIOGRAPHY N/A 12/17/2016   Procedure: Left Heart Cath and Coronary Angiography;  Surgeon: Burnell Blanks, MD;  Location: [facility];  Service: Cardiovascular;  Laterality: N/A;  . MULTIPLE EXTRACTIONS WITH ALVEOLOPLASTY N/A 12/21/2016   Procedure: Extraction of tooth #'s 12, 23,24,25,26 and 29 with alveoloplasty and gross debridement of remaining teeth.;  Surgeon: Lenn Cal, DDS;  Location: [facility];  Service: Oral Surgery;  Laterality: N/A;  . SHOULDER SURGERY     right  . SKIN GRAFT TO RIGHT HAND     AFTER BURN TO THE HAND  . TOTAL KNEE ARTHROPLASTY Left 08/03/2013   Procedure: TOTAL LEFT KNEE ARTHROPLASTY;  Surgeon: Gearlean Alf, MD;  Location: [facility];  Service: Orthopedics;  Laterality: Left;  . US ECHOCARDIOGRAPHY  05/15/2010   EF 60-65%    Family History  Problem Relation Age of Onset  . Cancer Mother        pancreatic  . Heart disease Father   . Hypertension Father   . Cancer Brother   . Diabetes Sister     Social History   Social History  . Marital status: Married    Spouse name: N/A  . Number of children: 4  . Years of education: N/A   Occupational History  . 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 Smoking status: Never Smoker  . Smokeless tobacco: Never Used  . Alcohol use No  . Drug use: No  . Sexual activity: Not Currently   Other Topics Concern  . Not on file   Social  History Narrative   Epworth Sleepiness scale score =11 as of 01/25/16    Current Outpatient Prescriptions  Medication Sig Dispense Refill  . allopurinol (ZYLOPRIM) 300 MG tablet Take 300 mg by mouth daily as needed (gout).    Marland Kitchen aspirin 81 MG chewable tablet Chew 1 tablet (81 mg total) by mouth daily.    . chlorhexidine (PERIDEX) 0.12 % solution Rinse with 15 mls twice daily for 30 seconds. Use after breakfast and at bedtime. Spit out excess. Do not swallow. 480 mL prn  . Cholecalciferol (VITAMIN D) 2000 units CAPS Take 2,000 Units by mouth daily.    . clopidogrel (PLAVIX) 75 MG tablet Take 1 tablet (75 mg total) by mouth daily with breakfast. 30 tablet 6  . furosemide (LASIX) 40 MG tablet Take 1 tablet (40 mg total) by mouth daily. Take half tablet by mouth daily as needed for swelling 30 tablet 3  . hydrALAZINE (APRESOLINE) 50 MG tablet Take 0.5 tablets (25 mg total) by mouth every 8 (eight) hours. 100 tablet 3  . metoprolol tartrate (LOPRESSOR) 25 MG tablet Take 1 tablet (25 mg  total) by mouth 2 (two) times daily. 60 tablet 5  . rosuvastatin (CRESTOR) 10 MG tablet Take 10 mg by mouth daily.   0  . tamoxifen (NOLVADEX) 20 MG tablet Take 1 tablet by mouth every morning. 90 tablet 3   Current Facility-Administered Medications  Medication Dose Route Frequency Provider Last Rate Last Dose  . nitroGLYCERIN (NITROSTAT) SL tablet 0.4 mg  0.4 mg Sublingual Q5 min PRN Kilroy, Luke K, PA-C        No Known Allergies    Review of Systems:   General:  normal appetite, decreased energy, no weight gain, no weight loss, no fever  Cardiac:  no chest pain with exertion, no chest pain at rest, + SOB with exertion, no resting SOB, no PND, no orthopnea, no palpitations, no arrhythmia, no atrial fibrillation, + LE edema, no dizzy spells, no syncope  Respiratory:  + exertional shortness of breath, no home oxygen, no productive cough, no dry cough, no bronchitis, no wheezing, no hemoptysis, no asthma, no pain  with inspiration or cough, no sleep apnea, no CPAP at night  GI:   no difficulty swallowing, no reflux, no frequent heartburn, no hiatal hernia, no abdominal pain, no constipation, no diarrhea, no hematochezia, no hematemesis, no melena  GU:   no dysuria,  no frequency, no urinary tract infection, no hematuria, no kidney stones, + kidney disease  Vascular:  no pain suggestive of claudication, no pain in feet, no leg cramps, no varicose veins, no DVT, no non-healing foot ulcer  Neuro:   no stroke, no TIA's, no seizures, no headaches, no temporary blindness one eye,  no slurred speech, no peripheral neuropathy, no chronic pain, no instability of gait, no memory/cognitive dysfunction  Musculoskeletal: mild arthritis in hands, no joint swelling, no myalgias, no difficulty walking, normal mobility   Skin:   no rash, no itching, no skin infections, no pressure sores or ulcerations  Psych:   no anxiety, no depression, no nervousness, no unusual recent stress  Eyes:   no blurry vision, + floaters, no recent vision changes, no wears glasses or contacts  ENT:   no hearing loss, no loose or painful teeth, + partial dentures, last saw dentist during recent hospitalization  Hematologic:  no easy bruising, no abnormal bleeding, no clotting disorder, no frequent epistaxis  Endocrine:  + diabetes, doe check CBG's at home           Physical Exam:   BP (!) 170/70 (BP Location: Right Arm, Patient Position: Sitting, Cuff Size: Large)   Pulse 73   Resp 16   Ht 5\' 3"  (1.6 m)   Wt 241 lb (109.3 kg)   SpO2 93% Comment: RA  BMI 42.69 kg/m   General:  Obese female NAD    HEENT:  Unremarkable   Neck:   no JVD, no bruits, no adenopathy   Chest:   clear to auscultation, symmetrical breath sounds, no wheezes, no rhonchi   CV:   RRR, grade III/VI crescendo/decrescendo murmur heard best at RSB,  no diastolic murmur  Abdomen:  soft, non-tender, no masses   Extremities:  warm, well-perfused, pulses diminished, no LE  edema  Rectal/GU  Deferred  Neuro:   Grossly non-focal and symmetrical throughout  Skin:   Clean and dry, no rashes, no breakdown   Diagnostic Tests:  Transthoracic Echocardiography  Patient:    Fumiko, Cham MR #:       [id number] Study Date: 09/04/2016 Gender:     F Age:  80 Height:     162.6 cm Weight:     111.9 kg BSA:        2.31 m^2 Pt. Status: Room:   SONOGRAPHER  Cindy Hazy, Blaine Croitoru, MD  [place] Croitoru, MD  ATTENDING    Lyman Bishop MD  PERFORMING   [facility], Outpatient  cc:  ------------------------------------------------------------------- LV EF: 60% -   65%  ------------------------------------------------------------------- Indications:      I35.9 Aortic Valve Disorder.  ------------------------------------------------------------------- History:   PMH:  Acquired from the patient and from the patient&'s chart.  PMH:  History of Breast Cancer. Chest pain. CHF. Murmur. Risk factors:  Hypertension. Diabetes mellitus. Obese. Dyslipidemia.  ------------------------------------------------------------------- Study Conclusions  - Left ventricle: The cavity size was normal. Wall thickness was   increased in a pattern of moderate LVH. Systolic function was   normal. The estimated ejection fraction was in the range of 60%   to 65%. Wall motion was normal; there were no regional wall   motion abnormalities. Doppler parameters are consistent with   pseudonormal left ventricular relaxation (grade 2 diastolic   dysfunction). LV filling pressure is elevated. - Aortic valve: Moderately calcified leaflets. Severe stenosis.   Mean gradient (S): 46 mm Hg. Peak gradient (S): 81 mm Hg. Valve   area (Vmax): 0.72 cm^2. - Mitral valve: Posterior calcified annulus. Mildly thickened   leaflets . - Left atrium: Moderately dilated. - Pulmonic valve: There was mild regurgitation. - Inferior vena cava: The vessel  was dilated. The respirophasic   diameter changes were blunted (< 50%), consistent with elevated   central venous pressure.  Impressions:  - Compared to a prior study in 2017, there is now severe aortic   valve stenosis. AVA around 0.7 cm2 - mean gradient of 46 mmHg.   LVEF is stable at 60-65%.  ------------------------------------------------------------------- Study data:   Study status:  Routine.  Procedure:  The patient reported no pain pre or post test. Transthoracic echocardiography for left ventricular function evaluation, for right ventricular function evaluation, and for assessment of valvular function. Image quality was adequate.  Study completion:  There were no complications.          Transthoracic echocardiography.  M-mode, complete 2D, spectral Doppler, and color Doppler.  Birthdate: Patient birthdate: [birth year].  Age:  Patient is 81 yr old.  Sex: Gender: female.    BMI: 42.3 kg/m^2.  Blood pressure:     158/73 Patient status:  Outpatient.  Study date:  Study date: 09/04/2016. Study time: 09:37 AM.  Location:  [facility]  -------------------------------------------------------------------  ------------------------------------------------------------------- Left ventricle:  The cavity size was normal. Wall thickness was increased in a pattern of moderate LVH. Systolic function was normal. The estimated ejection fraction was in the range of 60% to 65%. Wall motion was normal; there were no regional wall motion abnormalities. Doppler parameters are consistent with pseudonormal left ventricular relaxation (grade 2 diastolic dysfunction). LV filling pressure is elevated.  ------------------------------------------------------------------- Aortic valve:  Moderately calcified leaflets. Severe stenosis. Doppler:     VTI ratio of LVOT to aortic valve: 0.3. Indexed valve area (VTI): 0.41 cm^2/m^2. Peak velocity ratio of LVOT to aortic valve: 0.23. Valve area  (Vmax): 0.72 cm^2. Indexed valve area (Vmax): 0.31 cm^2/m^2. Mean velocity ratio of LVOT to aortic valve: 0.3. Valve area (Vmean): 0.94 cm^2. Indexed valve area (Vmean): 0.41 cm^2/m^2.    Mean gradient (S): 46 mm Hg. Peak gradient (S): 81 mm Hg.  -------------------------------------------------------------------  Aorta:  Aortic root: The aortic root was normal in size. Ascending aorta: The ascending aorta was normal in size.  ------------------------------------------------------------------- Mitral valve:  Posterior calcified annulus. Mildly thickened leaflets .  Doppler:  There was trivial regurgitation.    Peak gradient (D): 5 mm Hg.  ------------------------------------------------------------------- Left atrium:  Moderately dilated.  ------------------------------------------------------------------- Atrial septum:  No defect or patent foramen ovale was identified.   ------------------------------------------------------------------- Pulmonic valve:    Doppler:  There was mild regurgitation.  ------------------------------------------------------------------- Tricuspid valve:   Doppler:  There was no significant regurgitation.  ------------------------------------------------------------------- Pulmonary artery:   The main pulmonary artery was normal-sized.  ------------------------------------------------------------------- Right atrium:  The atrium was at the upper limits of normal in size.  ------------------------------------------------------------------- Pericardium:  There was no pericardial effusion.  ------------------------------------------------------------------- Systemic veins: Inferior vena cava: The vessel was dilated. The respirophasic diameter changes were blunted (< 50%), consistent with elevated central venous pressure.  ------------------------------------------------------------------- Measurements   Left ventricle                             Value          Reference  LV ID, ED, PLAX chordal                   48.2  mm       43 - 52  LV ID, ES, PLAX chordal                   32.9  mm       23 - 38  LV fx shortening, PLAX chordal            32    %        >=29  LV PW thickness, ED                       13.7  mm       ---------  IVS/LV PW ratio, ED                       1.09           <=1.3  Stroke volume, 2D                         85    ml       ---------  Stroke volume/bsa, 2D                     37    ml/m^2   ---------  LV e&', lateral                            10.4  cm/s     ---------  LV E/e&', lateral                          10.58          ---------  LV e&', medial                             6.36  cm/s     ---------  LV E/e&', medial  17.3           ---------  LV e&', average                            8.38  cm/s     ---------  LV E/e&', average                          13.13          ---------    Ventricular septum                        Value          Reference  IVS thickness, ED                         15    mm       ---------    LVOT                                      Value          Reference  LVOT ID, S                                20    mm       ---------  LVOT area                                 3.14  cm^2     ---------  LVOT ID                                   20    mm       ---------  LVOT peak velocity, S                     103   cm/s     ---------  LVOT mean velocity, S                     81.5  cm/s     ---------  LVOT VTI, S                               27    cm       ---------  LVOT peak gradient, S                     4     mm Hg    ---------  Stroke volume (SV), LVOT DP               84.8  ml       ---------  Stroke index (SV/bsa), LVOT DP            36.7  ml/m^2   ---------    Aortic valve                              Value  Reference  Aortic valve peak velocity, S             449   cm/s     ---------  Aortic valve mean velocity, S             272    cm/s     ---------  Aortic valve VTI, S                       90.4  cm       ---------  Aortic mean gradient, S                   33    mm Hg    ---------  Aortic peak gradient, S                   81    mm Hg    ---------  VTI ratio, LVOT/AV                        0.3            ---------  Aortic valve area/bsa, VTI                0.41  cm^2/m^2 ---------  Velocity ratio, peak, LVOT/AV             0.23           ---------  Aortic valve area, peak velocity          0.72  cm^2     ---------  Aortic valve area/bsa, peak               0.31  cm^2/m^2 ---------  velocity  Velocity ratio, mean, LVOT/AV             0.3            ---------  Aortic valve area, mean velocity          0.94  cm^2     ---------  Aortic valve area/bsa, mean               0.41  cm^2/m^2 ---------  velocity    Aorta                                     Value          Reference  Aortic root ID, ED                        26    mm       ---------  Ascending aorta ID, A-P, S                35    mm       ---------    Left atrium                               Value          Reference  LA ID, A-P, ES                            52    mm       ---------  LA ID/bsa, A-P                    (  H)     2.25  cm/m^2   <=2.2  LA volume, S                              89    ml       ---------  LA volume/bsa, S                          38.6  ml/m^2   ---------  LA volume, ES, 1-p A4C                    89    ml       ---------  LA volume/bsa, ES, 1-p A4C                38.6  ml/m^2   ---------  LA volume, ES, 1-p A2C                    89    ml       ---------  LA volume/bsa, ES, 1-p A2C                38.6  ml/m^2   ---------    Mitral valve                              Value          Reference  Mitral E-wave peak velocity               110   cm/s     ---------  Mitral A-wave peak velocity               87.9  cm/s     ---------  Mitral deceleration time                  183   ms       150 - 230  Mitral peak gradient, D                    5     mm Hg    ---------  Mitral E/A ratio, peak                    1.3            ---------    Right ventricle                           Value          Reference  RV s&', lateral, S                         16.3  cm/s     ---------  Legend: (L)  and  (H)  mark values outside specified reference range.  ------------------------------------------------------------------- Prepared and Electronically Authenticated by  Lyman Bishop MD 2018-04-10T10:23:15    Left Heart Cath and Coronary Angiography  Conclusion     Prox Cx to Mid Cx lesion, 99 %stenosed.  Ost 2nd Diag to 2nd Diag lesion, 70 %stenosed.  Mid LAD to Dist LAD lesion, 90 %stenosed.  Ost RCA to Prox RCA lesion, 40 %stenosed.  Mid RCA lesion, 80 %stenosed.  There is severe aortic valve stenosis.   1. Severe triple vessel  CAD 2. Severe stenosis mid LAD involving a moderate caliber diagonal branch 3. Severe stenosis distal Circumflex artery 4. Severe stenosis mid RCA 5. Severe aortic valve stenosis (mean gradient 32 mmHg, peak to peak gradient 40 mmHg)  Recommendations: Will admit to telemetry today for diuresis. She is grossly volume overloaded on exam. I was unable to obtain access for a right heart catheterization today. I will begin diuresis with IV Lasix and follow renal function closely. I will ask CT surgery to see her today to discuss CABG and AVR but I do not think she is a good candidate for a complex surgical procedure. If she is not felt to be a surgical candidate, will plan PCI of the LAD, Circumflex and RCA later this week and then continue planning for TAVR. If we bring her back later this week for PCI, will plan right heart cath at that time from right antecubital approach which may be easier if we can unload volume.    Indications   Severe aortic valve stenosis [I35.0 (ICD-10-CM)]  Coronary artery disease involving native coronary artery of native heart with unstable angina pectoris (HCC)  [I25.110 (ICD-10-CM)]  Procedural Details/Technique   Technical Details Indication: Severe AS, dyspnea c/w unstable angina  Procedure: The risks, benefits, complications, treatment options, and expected outcomes were discussed with the patient. The patient and/or family concurred with the proposed plan, giving informed consent. The patient was brought to the cath lab after IV hydration was given. The patient was further sedated with Versed and Fentanyl. We were unable to obtain access in the right antecubital vein. The right groin was prepped and draped in the usual manner. I attempted to place a sheath in the right femoral vein but was unsuccessful. A 5 French sheath as placed in the right femoral artery. Standard diagnostic catheters were used to perform selective coronary angiography. I was able to cross the aortic valve with an AL-1 catheter and a straight wire. LV pressures measured. No LV gram. I was unable to obtain venous access. I could not visualize a left femoral vein with U/S.   There were no immediate complications. The patient was taken to the recovery area in stable condition.    Estimated blood loss <50 mL.  During this procedure the patient was administered the following to achieve and maintain moderate conscious sedation: Versed 2 mg, Fentanyl 50 mcg, while the patient's heart rate, blood pressure, and oxygen saturation were continuously monitored. The period of conscious sedation was 68 minutes, of which I was present face-to-face 100% of this time.    Complications   Complications documented before study signed (12/17/2016 8:57 AM EDT)    LEFT HEART CATH AND CORONARY ANGIOGRAPHY   None Documented by Burnell Blanks, MD 12/17/2016 8:57 AM EDT  Time Range: Intra-procedure      Coronary Findings   Dominance: Right  Left Anterior Descending  Vessel is large.  Mid LAD to Dist LAD lesion, 90% stenosed.  Second Diagonal Branch  Vessel is moderate in size.  Ost 2nd  Diag to 2nd Diag lesion, 70% stenosed.  Second Septal Branch  Vessel is small in size.  Third Diagonal Branch  Vessel is small in size.  Third Septal Branch  Vessel is small in size.  Ramus Intermedius  Vessel is moderate in size.  Left Circumflex  Vessel is large.  Prox Cx to Mid Cx lesion, 99% stenosed.  First Obtuse Marginal Branch  Vessel is small in size.  Second Obtuse Marginal Branch  Vessel  is small in size.  Third Obtuse Marginal Branch  Vessel is moderate in size.  Right Coronary Artery  Ost RCA to Prox RCA lesion, 40% stenosed.  Mid RCA lesion, 80% stenosed.  Left Heart   Aortic Valve There is severe aortic valve stenosis. The aortic valve is calcified.    Coronary Diagrams   Diagnostic Diagram       Implants     No implant documentation for this case.  PACS Images   Show images for Cardiac catheterization   Link to Procedure Log   Procedure Log    Hemo Data    Most Recent Value  Aortic Mean Gradient 40.2 mmHg  Aortic Peak Gradient 32 mmHg  AO Systolic Pressure 578 mmHg  AO Diastolic Pressure 65 mmHg  AO Mean 99 mmHg  LV Systolic Pressure 469 mmHg  LV Diastolic Pressure 10 mmHg  LV EDP 24 mmHg  Arterial Occlusion Pressure Extended Systolic Pressure 629 mmHg  Arterial Occlusion Pressure Extended Diastolic Pressure 68 mmHg  Arterial Occlusion Pressure Extended Mean Pressure 106 mmHg  Left Ventricular Apex Extended Systolic Pressure 528 mmHg  Left Ventricular Apex Extended Diastolic Pressure 7 mmHg  Left Ventricular Apex Extended EDP Pressure 27 mmHg  Order-Level Documents - 12/17/2016:   Scan on 12/23/2016 11:39 AM by Default, Provider, MD      Encounter-Level Documents - 12/17/2016:   Scan on 12/23/2016 11:43 AM by Default, Provider, MD  Scan on 12/23/2016 11:39 AM by Default, Provider, MD  Document on 12/22/2016 1:35 PM by Cathi Roan, RN : IP After Visit Summary  Scan on 12/22/2016 9:08 AM by Default, Provider, MD  Scan on 12/17/2016  11:44 AM by Default, Provider, MD  Scan on 12/17/2016 8:42 AM by Default, Provider, MD  Electronic signature on 12/17/2016 5:35 AM  Electronic signature on 12/17/2016 5:35 AM      Coronary Stent Intervention  Conclusion     A STENT SYNERGY DES 3X16 drug eluting stent was successfully placed.  Mid Cx lesion, 99 %stenosed.  Post intervention, there is a 0% residual stenosis.  A STENT SYNERGY DES 2.5X28 drug eluting stent was successfully placed.  Mid LAD lesion, 90 %stenosed.  Post intervention, there is a 0% residual stenosis.  Ost 2nd Diag lesion, 70 %stenosed.  Post intervention, there is a 60% residual stenosis.   1. Severe stenosis mid LAD at bifurcation of the Diagonal. Successful PTCA/DES x 1 mid LAD. Successful PTCA with balloon angioplasty only ostium Diagonal.  2. Severe stenosis mid Circumflex. Successful PTCA/DES x 1 mid to distal Circumflex.   Recommendations. Will continue ASA and Plavix for at least 3 months. Follow renal function and H/H in am. I will plan medical management of the moderate stenosis in the mid RCA. I will discuss f/u with Dr. Roxy Manns for second surgical consult. She will need staged CT scans in preparation for TAVR based on renal function. She will have TAVR then will have carotid endarterectomy when all cardiac issues are stable in several months.    Indications   Unstable angina (HCC) [I20.0 (ICD-10-CM)]  Procedural Details/Technique   Technical Details Indication: 81 yo female with critical carotid stenosis, severe double vessel CAD, severe AS, anemia, CKD here today for planned PCI.   Procedure: The risks, benefits, complications, treatment options, and expected outcomes were discussed with the patient. The patient and/or family concurred with the proposed plan, giving informed consent. The patient was brought to the cath lab after IV hydration was begun. The patient was further  sedated with Versed and Fentanyl. The right wrist was prepped and  draped in a sterile fashion. 1% lidocaine was used for local anesthesia. Using the modified Seldinger access technique, a 5 French sheath was placed in the right radial artery. 3 mg Verapamil was given through the sheath. 12,000 units IV heparin was given. IV heparin used for anti-coagulation. ACT over 300.   Lesion #1: (mid Circumflex): I engaged the left main with an EBU 3.5. Guiding catheter. I passed a Cougar IC wire down the circumflex artery. 2.5 x 12 mm balloon used to pre-dilate the stenosis in the mid vessel. A 3.0 16 mm Synergy DES was deployed in the mid Circumflex. The stent was post-dilated with a 3.25 x 12 mm [place] balloon x 1.   Lesion #2: (mid LAD/bifurcation Diagonal): Cougar IC wire down the LAD. Cougar IC wire down the Diagonal. I used a 2.0 x 20 mm balloon to pre-dilate the mid LAD. I then used a 2.5 x 12 mm balloon to pre-dilate the ostium of the diagonal branch. I then deployed a 2.5 x 28 mm Synergy DES in the mid LAD. The stent was post-dilated with a 2.75 x 12 mm [place] balloon x 2.   The sheath was removed from the right radial artery and a Terumo hemostasis band was applied at the arteriotomy site on the right wrist.     Estimated blood loss <50 mL.  During this procedure the patient was administered the following to achieve and maintain moderate conscious sedation: Versed 3 mg, Fentanyl 75 mcg, while the patient's heart rate, blood pressure, and oxygen saturation were continuously monitored. The period of conscious sedation was 83 minutes, of which I was present face-to-face 100% of this time.    Complications   Complications documented before study signed (12/28/2016 12:16 PM EDT)    CORONARY STENT INTERVENTION   None Documented by Burnell Blanks, MD 12/28/2016 12:16 PM EDT  Time Range: Intra-procedure      Coronary Findings   Dominance: Right  Left Anterior Descending  Mid LAD lesion, 90% stenosed.  Angioplasty: Pre-stent angioplasty was performed using a  BALLOON SAPPHIRE 2.0X20. A STENT SYNERGY DES 2.5X28 drug eluting stent was successfully placed. Stent strut is well apposed. Post-stent angioplasty was performed using a BALLOON SAPPHIRE [place] [id number]. The pre-interventional distal flow is normal (TIMI 3). The post-interventional distal flow is normal (TIMI 3). The intervention was successful . No complications occurred at this lesion.  There is no residual stenosis post intervention.  Second Diagonal [employer] 2nd Diag lesion, 70% stenosed.  Angioplasty: Angioplasty alone was performed using a BALLOON SAPPHIRE 2.5X12. The pre-interventional distal flow is normal (TIMI 3). The post-interventional distal flow is normal (TIMI 3). The intervention was successful . No complications occurred at this lesion.  There is a 60% residual stenosis post intervention.  Left Circumflex  Mid Cx lesion, 99% stenosed.  Angioplasty: Pre-stent angioplasty was performed using a BALLOON SAPPHIRE 2.5X12. A STENT SYNERGY DES 3X16 drug eluting stent was successfully placed. Stent strut is well apposed. Post-stent angioplasty was performed using a BALLOON SAPPHIRE [place] 3.25X12. The pre-interventional distal flow is normal (TIMI 3). The post-interventional distal flow is normal (TIMI 3). The intervention was successful . No complications occurred at this lesion.  There is no residual stenosis post intervention.  Coronary Diagrams   Diagnostic Diagram       Post-Intervention Diagram          Cardiac TAVR CT  TECHNIQUE: The patient was scanned  on a Philips 539 slice scanner . A 120 kV retrospective scan was triggered in the ascending thoracic aorta at 140 HU's. Gantry rotation speed was 250 msecs and collimation was .6 mm. No beta blockade or nitro were given. The 3D data set was reconstructed in 5% intervals of the R-R cycle. Systolic and diastolic phases were analyzed on a dedicated work station using MPR, MIP and VRT modes. The patient received 80 cc of  contrast.  FINDINGS: Aortic Valve: Tri leaflet and calcified with restricted leaflet motion  Aorta: Mild calcific atherosclerotic debris. No aneurysm normal arch vessel origin  Sinotubular Junction:  25.4 mm  Ascending Thoracic Aorta:  33 mm  Aortic Arch:  26 mm  Descending Thoracic Aorta:  26 mm  Sinus of Valsalva Measurements:  Non-coronary:  26 mm  Right -coronary:  24.5 mm  Left -coronary:  24.5 mm  Coronary Artery Height above Annulus:  Left Main:  11.4 mm above annulus  Right Coronary:  11.2 mm above annulus  Virtual Basal Annulus Measurements:  Maximum/Minimum Diameter:  21.8 mm x 25.8 mm  Perimeter:  75 mm  Area:  437 mm2  Coronary Arteries:  Sufficient height above annulus for deployment  Optimum Fluoroscopic Angle for Delivery: LAO 29 degrees Caudal 14 degrees  IMPRESSION: 1) Calcified tri leaflet aortic valve with annulus 437 mm2 suitable for a 26 mm Sapien 2 valve  2) Optimum angle for deployment LAO 29 degrees Caudal 14 degrees  3) Normal aortic root 33 mm  4) Coronary arteries sufficient height above annulus for deployment  Jenkins Rouge   Electronically Signed   By: Jenkins Rouge M.D.   On: 01/10/2017 12:22   CT ANGIOGRAPHY CHEST, ABDOMEN AND PELVIS  TECHNIQUE: Multidetector CT imaging through the chest, abdomen and pelvis was performed using the standard protocol during bolus administration of intravenous contrast. Multiplanar reconstructed images and MIPs were obtained and reviewed to evaluate the vascular anatomy.  CONTRAST:  120 mL of Isovue 370.  COMPARISON:  CT the abdomen and pelvis 12/18/2016. Chest CT 01/06/2008.  FINDINGS: CTA CHEST FINDINGS  Cardiovascular: Heart size is mildly enlarged. There is no significant pericardial fluid, thickening or pericardial calcification. There is aortic atherosclerosis, as well as atherosclerosis of the great vessels of the mediastinum and  the coronary arteries, including calcified atherosclerotic plaque in the left anterior descending, left circumflex and right coronary arteries. Severe calcifications of the aortic valve. Moderate calcifications of the mitral annulus.  Mediastinum/Lymph Nodes: No pathologically enlarged mediastinal or hilar lymph nodes. Esophagus is unremarkable in appearance. No axillary lymphadenopathy.  Lungs/Pleura: There is a background of patchy ground-glass attenuation scattered throughout both lungs with some associated mild interlobular septal thickening, the appearance of which is most suggestive of a background of mild interstitial pulmonary edema. No confluent consolidative airspace disease. No pleural effusions. 4 mm pulmonary nodule in the right middle lobe (axial image 41 of series 16), stable in retrospect compared to prior study from 2009, considered definitively benign. No other larger more suspicious appearing pulmonary nodules or masses are noted elsewhere in the lungs. Peripheral architectural distortion and subpleural reticulation in the left upper lobe deep to the left breast, presumably from prior radiation therapy.  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 There are no aggressive appearing lytic or blastic lesions noted in  the visualized portions of the skeleton.  CTA ABDOMEN AND PELVIS FINDINGS  Hepatobiliary: Liver has a slightly shrunken and nodular contour, suggesting underlying cirrhosis. No discrete cystic or solid hepatic lesions. Small calcified granuloma in the central aspect of the liver. No intra or extrahepatic biliary ductal dilatation. Status post cholecystectomy.  Pancreas: No  pancreatic mass. No pancreatic ductal dilatation. No pancreatic or peripancreatic fluid or inflammatory changes.  Spleen: Spleen is mildly enlarged measuring 13.5 x 4.9 x 13.4 cm (estimated splenic volume of 443 mL).  Adrenals/Urinary Tract: 1.8 cm low-attenuation lesion in the anterior aspect of the upper pole of the right kidney is compatible with a simple cyst. Several other subcentimeter low-attenuation lesions are noted in both kidneys, too small to definitively characterize, but statistically likely to represent tiny cysts. Bilateral adrenal glands are normal in appearance. No hydroureteronephrosis. Urinary bladder is normal in appearance.  Stomach/Bowel: The appearance of the stomach is normal. There is no pathologic dilatation of small bowel or colon. The appendix is not confidently identified and may be surgically absent. Regardless, there are no inflammatory changes noted adjacent to the cecum to suggest the presence of an acute appendicitis at this time.  Vascular/Lymphatic: Aortic atherosclerosis, with vascular findings and measurements pertinent to potential TAVR procedure, as detailed below. No aneurysm or dissection noted in the abdominal or pelvic vasculature. The celiac axis, superior mesenteric artery and inferior mesenteric artery and their major branches are all widely patent without hemodynamically significant stenosis. Two renal arteries are noted for both kidneys, all of which appear patent without hemodynamically significant stenosis. No lymphadenopathy identified in the abdomen or pelvis.  Reproductive: Uterus and ovaries are unremarkable in appearance.  Other: No significant volume of ascites.  No pneumoperitoneum.  Musculoskeletal: There are no aggressive appearing lytic or blastic lesions noted in the visualized portions of the skeleton.  VASCULAR MEASUREMENTS PERTINENT TO TAVR:  AORTA:  Minimal Aortic Diameter -  16 x 15 mm  Severity  of Aortic Calcification -  mild  RIGHT PELVIS:  Right Common Iliac Artery -  Minimal Diameter - 10.8 x 10.1 mm  Tortuosity - mild  Calcification - mild  Right External Iliac Artery -  Minimal Diameter - 7.1 x 7.8 mm  Tortuosity - moderate  Calcification - none  Right Common Femoral Artery -  Minimal Diameter - 8.2 x 7.8 mm  Tortuosity - mild  Calcification - none  LEFT PELVIS:  Left Common Iliac Artery -  Minimal Diameter - 11.1 x 9.5 mm  Tortuosity - mild  Calcification - mild  Left External Iliac Artery -  Minimal Diameter - 8.2 x 7.7 mm  Tortuosity - moderate  Calcification - none  Left Common Femoral Artery -  Minimal Diameter - 7.9 x 8.3 mm  Tortuosity - mild  Calcification - minimal  Review of the MIP images confirms the above findings.  IMPRESSION: 1. Vascular findings and measurements pertinent to potential TAVR procedure, as detailed above. This patient does appear to have suitable pelvic arterial access bilaterally. 2. Severe thickening calcification of the aortic valve, compatible with the reported clinical history of severe aortic stenosis. 3. Cardiomegaly with evidence of mild interstitial pulmonary edema, suggesting a background of mild congestive heart failure. 4. Postoperative changes and areas of architectural distortion in the left breast. The largest of these areas measures 4.9 x 3.7 cm and demonstrates some internal and peripheral calcifications. While this is favored to represent a chronic postoperative seroma, correlation with nonemergent mammography is recommended. 5. Morphologic changes  in the liver suggestive of underlying cirrhosis. There is also mild splenomegaly. 6. Additional incidental findings, as above. Aortic Atherosclerosis (ICD10-I70.0).   Electronically Signed   By: Vinnie Langton M.D.   On: 01/10/2017 13:23    STS Risk Calculator  Procedure    AVR + CABG  Risk of  Mortality   5.3% Morbidity or Mortality  30.9% Prolonged LOS   18.4% Short LOS    12.3% Permanent Stroke   3.0% Prolonged Vent Support  22.6% DSW Infection    0.9% Renal Failure    13.8% Reoperation    9.7%    Impression:  Patient has stage D severe symptomatic aortic stenosis and multivessel coronary artery disease. She presents with gradual progression of symptoms of exertional shortness of breath and fatigue consistent with chronic diastolic congestive heart failure, New [place] Heart Association functional class III. I have personally reviewed the patient's recent echocardiogram, cardiac catheterizations, and CT angiograms. Transthoracic echocardiogram performed last April demonstrates presence of severe aortic stenosis with normal left ventricular systolic function. Peak velocity across the aortic valve measured 4.5 m/s corresponding to mean transvalvular gradient estimated 33 mmHg. The DVI was reported 0.30. All 3 leaflets of the patient's aortic valve were moderately thickened and calcified. 2 of the 3 leaflets were severely restricted, the third leaflet moves a little bit better than the other 2. Diagnostic cardiac catheterization confirmed the presence of severe aortic stenosis and also revealed significant multivessel coronary artery disease. She is diabetic and symptoms could be consistent with angina pectoris, although the patient reports that she has not experienced any significant improvement in her symptoms of exertional shortness of breath since she underwent PCI and stenting. Risks associated with conventional surgery would be at least moderately elevated because of the patient's age and comorbid medical problems.  Cardiac-gated CTA of the heart reveals anatomical characteristics consistent with aortic stenosis suitable for treatment by transcatheter aortic valve replacement without any significant complicating features and CTA of the aorta and iliac vessels demonstrate what appears to be  adequate pelvic vascular access to facilitate a transfemoral approach.    Plan:  The patient and her son-in-law were counseled at length regarding treatment alternatives for management of severe symptomatic aortic stenosis.  The natural history of aortic stenosis and long-term prognosis with medical therapy were discussed.  Alternative approaches such as conventional surgical aortic valve replacement, transcatheter aortic valve replacement, and palliative medical therapy were compared and contrasted at length. Expectations regarding the patient's postoperative convalescence after both conventional surgery and TAVR were discussed.  Issues specific to TAVR were discussed including questions regarding long term valve durability, potentially increased risk of need for permanent pacemaker placement, and the possibility of significant paravalvular leak.  This discussion was placed in the context of the patient's own specific clinical presentation and past medical history.  All of their questions been addressed.  The patient is interested in proceeding with transcatheter aortic valve replacement in the near future. She is reluctant to schedule a date at this time because she wants to discuss timing with other family members, but she hopes to proceed within the next month or so.  Following the decision to proceed with transcatheter aortic valve replacement, a discussion has been held regarding what types of management strategies would be attempted intraoperatively in the event of life-threatening complications, including whether or not the patient would be considered a candidate for the use of cardiopulmonary bypass and/or conversion to open sternotomy for attempted surgical intervention.  The patient has been  advised of a variety of complications that might develop including but not limited to risks of death, stroke, paravalvular leak, aortic dissection or other major vascular complications, aortic annulus rupture,  device embolization, cardiac rupture or perforation, mitral regurgitation, acute myocardial infarction, arrhythmia, heart block or bradycardia requiring permanent pacemaker placement, congestive heart failure, respiratory failure, renal failure, pneumonia, infection, other late complications related to structural valve deterioration or migration, or other complications that might ultimately cause a temporary or permanent loss of functional independence or other long term morbidity.  The patient provides full informed consent for the procedure as described and all questions were answered.  I spent in excess of 90 minutes during the conduct of this office consultation and >50% of this time involved direct face-to-face encounter with the patient for counseling and/or coordination of their care.    Valentina Gu. Roxy Manns, MD 01/16/2017 4:51 PM

## 2017-02-05 NOTE — Anesthesia Procedure Notes (Signed)
Procedure Name: Intubation Date/Time: 02/05/2017 11:39 AM Performed by: Rejeana Brock L Pre-anesthesia Checklist: Patient identified, Emergency Drugs available, Suction available and Patient being monitored Patient Re-evaluated:Patient Re-evaluated prior to induction Oxygen Delivery Method: Circle System Utilized Preoxygenation: Pre-oxygenation with 100% oxygen Induction Type: IV induction Ventilation: Mask ventilation without difficulty Laryngoscope Size: Glidescope and 4 Grade View: Grade I Tube type: Subglottic suction tube Tube size: 7.0 mm Number of attempts: 1 Airway Equipment and Method: Stylet and Oral airway Placement Confirmation: ETT inserted through vocal cords under direct vision,  positive ETCO2 and breath sounds checked- equal and bilateral Secured at: 22 cm Tube secured with: Tape Dental Injury: Teeth and Oropharynx as per pre-operative assessment

## 2017-02-05 NOTE — Anesthesia Postprocedure Evaluation (Signed)
Anesthesia Post Note  Patient: [name]  Procedure(s) Performed: Procedure(s) (LRB): TRANSCATHETER AORTIC VALVE REPLACEMENT, TRANSFEMORAL (N/A) TRANSESOPHAGEAL ECHOCARDIOGRAM (TEE) (N/A)     Patient location during evaluation: PACU Anesthesia Type: MAC Level of consciousness: awake and alert Pain management: pain level controlled Vital Signs Assessment: vitals unstable Respiratory status: spontaneous breathing, nonlabored ventilation, respiratory function stable and patient connected to nasal cannula oxygen Cardiovascular status: unstable Anesthetic complications: no Comments: Pt became unstable in ICU. Thought to have bleeding from femoral artery. ECHO looks good. Will go back to OR for femoral artery exploration.    Last Vitals:  Vitals:   02/05/17 1245 02/05/17 1300  BP: (!) 72/36 110/65  Pulse: (!) 130 (!) 129  Resp: (!) 28 (!) 30  Temp:    SpO2: 100% 99%    Last Pain:  Vitals:   02/05/17 1045  TempSrc: Oral                 Calvin Chura,W. EDMOND

## 2017-02-05 NOTE — Progress Notes (Signed)
  Echocardiogram 2D Echocardiogram Limited has been performed.  Darlina Sicilian M 02/05/2017, 1:08 PM

## 2017-02-05 NOTE — Transfer of Care (Signed)
Immediate Anesthesia Transfer of Care Note  Patient: Carrie Monroe  Procedure(s) Performed: Procedure(s): TRANSCATHETER AORTIC VALVE REPLACEMENT, TRANSFEMORAL (N/A) TRANSESOPHAGEAL ECHOCARDIOGRAM (TEE) (N/A)  Patient Location: ICU  Anesthesia Type:MAC  Level of Consciousness: awake, alert  and oriented  Airway & Oxygen Therapy: Patient Spontanous Breathing and Patient connected to face mask oxygen  Post-op Assessment: Report given to RN, Post -op Vital signs reviewed and stable and Patient moving all extremities X 4  Post vital signs: Reviewed and stable  Last Vitals:  Vitals:   02/05/17 1004 02/05/17 1009  BP:    Pulse: (!) 185 (!) 130  Resp:    Temp:    SpO2:      Last Pain:  Vitals:   02/05/17 0552  TempSrc: Oral      Patients Stated Pain Goal: 3 (97/02/63 [id number])  Complications: No apparent anesthesia complications

## 2017-02-05 NOTE — Op Note (Signed)
[facility]ART [facility] VASC[facility]ENTER   MULTIDISCIPLINARY HEART VALVE TEAM   TAVR OPERATIVE NOTE   Date of Procedure:  02/05/2017  Preoperative Diagnosis: Severe Aortic Stenosis   Postoperative Diagnosis: Same   Procedure:    Transcatheter Aortic Valve Replacement - Percutaneous Left Transfemoral Approach  Medtronic CoreValve Evolut Pro (size 26 mm, serial # [id number])   Co-Surgeons:  Valentina Gu. Roxy Manns, MD and Lauree Chandler, MD  Anesthesiologist:  Arabella Merles, MD  Echocardiographer:  Jenkins Rouge, MD  Pre-operative Echo Findings:  Severe aortic stenosis  Normal left ventricular systolic function  Post-operative Echo Findings:  No paravalvular leak  Normal left ventricular systolic function   BRIEF CLINICAL NOTE AND INDICATIONS FOR SURGERY  Patient is a 81 year old obese white female with history of aortic stenosis, coronary artery disease, hypertension, stage III chronic kidney disease, type 2 diabetes mellitus with complications, hyperlipidemia, gout, and breast cancer status post lumpectomy with radiation therapy who has been referred for second surgical opinion to discuss treatment options for management of severe symptomatic aortic stenosis. The patient has been known to have a heart murmur for several years and has been followed for the last few years by Dr. Sallyanne Kuster for aortic stenosis. Over the past 6-8 months patient has developed progressive exertional fatigue and shortness of breath without any associated symptoms of chest tightness or chest pressure. Follow-up echocardiogram performed 09/04/2016 revealed severe aortic stenosis with normal left ventricular systolic function. She was referred to the multidisciplinary valve clinic and underwent diagnostic cardiac catheterization by Dr. Angelena Form 12/17/2016 which confirmed the presence of severe aortic stenosis and was notable for severe multivessel coronary artery disease. She was seen in consultation by Dr. Cyndia Bent  and options were discussed including conventional surgical aortic valve replacement with coronary artery bypass grafting versus multivessel PCI and stenting with delayed transcatheter aortic valve replacement. CT angiography was performed to further evaluate the feasibility of transcatheter aortic valve replacement, and the patient underwent successful PCI and stenting of the left anterior descending coronary artery and the right coronary artery on 12/28/2016 by Dr.McAlhany.  The patient returns to the office today for second surgical consultation.  During the course of the patient's preoperative work up they have been evaluated comprehensively by a multidisciplinary team of specialists coordinated through the [facility] in the [facility].  They have been demonstrated to suffer from symptomatic severe aortic stenosis as noted above. The patient has been counseled extensively as to the relative risks and benefits of all options for the treatment of severe aortic stenosis including long term medical therapy, conventional surgery for aortic valve replacement, and transcatheter aortic valve replacement.  All questions have been answered, and the patient provides full informed consent for the operation as described.   DETAILS OF THE OPERATIVE PROCEDURE  PREPARATION:    The patient is brought to the operating room on the above mentioned date and central monitoring was established by the anesthesia team including placement of a central venous line and radial arterial line. The patient is placed in the supine position on the operating table.  Intravenous antibiotics are administered. The patient is monitored closely throughout the procedure under conscious sedation.  Baseline transthoracic echocardiogram was performed. [name] chest, abdomen, both groins, and both lower extremities are prepared and draped in a sterile manner. A time out procedure is  performed.   PERIPHERAL ACCESS:    Using the modified Seldinger technique, femoral arterial and venous access was obtained with placement of 6  Fr sheaths on the right side.  A pigtail diagnostic catheter was passed through the right arterial sheath under fluoroscopic guidance into the aortic root.  The pigtail catheter is positioned in the non-coronary sinus of Valsalva.  A temporary transvenous pacemaker catheter was passed through the right femoral venous sheath under fluoroscopic guidance into the right ventricle.  The pacemaker was tested to ensure stable lead placement and pacemaker capture. Aortic root angiography was performed in order to determine the optimal angiographic angle for valve deployment.   TRANSFEMORAL ACCESS:   Percutaneous transfemoral access and sheath placement was performed by Dr. Angelena Form using ultrasound guidance.  The left common femoral artery was cannulated using a micropuncture needle and appropriate location was verified using hand injection angiogram.  A pair of Abbott Perclose percutaneous closure devices were placed and a 6 French sheath replaced into the femoral artery.  The patient was heparinized systemically and ACT verified > 250 seconds.    A 16 Fr transfemoral Gore DrySeal sheath was introduced into the right common femoral artery after progressively dilating over an Amplatz superstiff wire. An AL-1 catheter was used to direct a straight-tip exchange length wire across the native aortic valve into the left ventricle. This was exchanged out for a pigtail catheter and position was confirmed in the LV apex. The pigtail catheter was exchanged for Confida wire in the LV apex.  Echocardiography was utilized to confirm appropriate wire position and no sign of entanglement in the mitral subvalvular apparatus.   TRANSCATHETER HEART VALVE DEPLOYMENT:   A Medtronic CoreValve Evolut Pro transcatheter heart valve (size26 mm, serial #[id number]) was prepared and crimped per  manufacturer's guidelines, and the proper loading of the valve is confirmed on the [facility] Pro delivery system using flouroscopy. The DrySeal sheath was removed and the valve and delivery system were advanced over the guidewire, through the iliac arteries and aorta, and advanced across the aortic arch using flouroscopy. The valve was carefully positioned across the aortic valve annulus. Once appropriate position of the valve has been confirmed by angiographic assessment, the valve is deployed gradually to 80%, at which time a second aortogram was performed to confirm the appropriate depth and position of deployment.  Once final position was confirmed, deployment was completed, the valve released, and the delivery system carefully removed from the aortic root. Valve function is assessed using echocardiography. There is felt to be trivial paravalvular leak and no central aortic insufficiency.  [name] hemodynamic recovery following valve deployment is good.     PROCEDURE COMPLETION:   The deployment system is and guidewire were removed and femoral artery closure performed by Dr. Angelena Form.  Initially there was considerable bleeding suggesting that the Perclose sutures were not adequately secure. Subsequently an 8 French Angio-Seal device was placed and satisfactory hemostasis achieved. Protamine was administered once femoral arterial repair was complete. The temporary pacemaker, pigtail catheters and femoral sheaths were removed with manual pressure used for hemostasis.   The patient tolerated the procedure well and is transported to the surgical intensive care in stable condition. There were no immediate intraoperative complications. All sponge instrument and needle counts are verified correct at completion of the operation.   No blood products were administered during the operation.  The patient received a total of 39.5 mL of intravenous contrast during the procedure.   Rexene Alberts,  MD 02/05/2017 9:56 AM

## 2017-02-05 NOTE — Progress Notes (Signed)
    Patient underwent successful TAVR today. When 69 F sheath was removed from left groin after valve deployment, her pre close suture became dislodged. Angioseal and manual pressure in the OR were required to achieve hemostasis. Hg noted to drop from 10--> 6.1 She was hemodynamically stable and brought to ICU for recovery where she was started on blood transfusion. ECG showed sinus with new LBBB s/p TAVR. Shortly after arrival to ICU her left groin was noted to be significantly more bruised, hard and indurated. Dr. Lonna Cobb was called to the bedside. Manual pressure was used and groin became significantly more soft. She also became hypotensive requiring levophed and dopamine. Dr. Trula Slade from [facility] was consulted . Her groin was improving and she had good pedal pulses, so it was felt that good hemostasis had been achieved. However, she continued to have hypotension and tachycardia requiring escalation of pressors (levophed, neo senepherine, epi, vasopressin, DA). She got a total of 8 units of blood. Hypotension improved with blood administration and then would fall shortly after completion. She also got two units of FFP. She then developed chest pain and respiratory distress. ECG showed LBBB with sinus tachy. She ultimately required intubation and transfer to OR for exploratory surgery.    Angelena Form PA-C  [facility]

## 2017-02-05 NOTE — Interval H&P Note (Signed)
History and Physical Interval Note:  02/05/2017 7:51 AM  Carrie Monroe  has presented today for surgery, with the diagnosis of Severe AS  The various methods of treatment have been discussed with the patient and family. After consideration of risks, benefits and other options for treatment, the patient has consented to  Procedure(s): TRANSCATHETER AORTIC VALVE REPLACEMENT, TRANSFEMORAL (N/A) TRANSESOPHAGEAL ECHOCARDIOGRAM (TEE) (N/A) as a surgical intervention .  [name] history has been reviewed, patient examined, no change in status, stable for surgery.  I have reviewed the patient's chart and labs.  Questions were answered to the patient's satisfaction.     Lauree Chandler

## 2017-02-05 NOTE — Consult Note (Signed)
[facility] [place]  Patient name: Carrie Monroe MRN: [id number] DOB: [birth year] Sex: female   REQUESTING PROVIDER:    Dr. Roxy Manns Dr. Angelena Form   REASON FOR CONSULT:    Left groin hematoma  HISTORY OF PRESENT ILLNESS:   Carrie Monroe is a 81 y.o. female, who Underwent TAVR earlier today for aortic stenosis.  There was difficulty with sheath removal and closure device in the left groin, necessitating manual pressure.  The patient was brought back to the intensive care unit where she became hypotensive.  Daniel pressure continued to be held when I was called.  Upon my arrival, the patient became hypotensive.  She did respond to IV fluid resuscitation.  The patient has a history of coronary artery disease, hypertension, stage III renal insufficiency, hyperlipidemia on a statin, diabetes, and breast cancer status post lumpectomy and radiation.  PAST MEDICAL HISTORY    Past Medical History:  Diagnosis Date  . Aortic stenosis, severe   . Arthritis   . Breast cancer ([place])    a. L breast cancer s/p lumpectomy and XRT  . Carotid artery stenosis    a. 11/2016: 80-99% RICA stenosis, 1-39% vs low range 67-67% LICA   . Chronic diastolic CHF (congestive heart failure) ([place])   . CKD (chronic kidney disease)   . Coronary artery disease    a. 11/2016: diagnosed with multivessel CAD, turned down for CABG and underwent PCI/DES to mLCx, PCI/DES to mLAD and PCTA of ostial diagonal on 12/28/16  . Diabetes mellitus    a. diet controlled   . Dyspnea    on exertion  . Exogenous obesity   . Gout   . Hemorrhoids   . History of kidney stones   . Hyperlipidemia   . Hypertension   . 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   26 mm Medtronic CorValve Evolut Pro transcatheter heart valve placed via percutaneous left transfemoral approach      FAMILY HISTORY   Family History  Problem Relation Age of Onset  .  Cancer Mother        pancreatic  . Heart disease Father   . Hypertension Father   . Cancer Brother   . Diabetes Sister     SOCIAL HISTORY:   Social History   Social History  . Marital status: Married    Spouse name: N/A  . Number of children: 4  . Years of education: N/A   Occupational History  . 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 Smoking status: Never Smoker  . Smokeless tobacco: Never Used  . Alcohol use No  . Drug use: No  . Sexual activity: Not Currently   Other Topics Concern  . Not on file   Social History Narrative   Epworth Sleepiness scale score =11 as of 01/25/16    ALLERGIES:    No Known Allergies  CURRENT MEDICATIONS:    Current Facility-Administered Medications  Medication Dose Route Frequency Provider Last Rate Last Dose  . 0.45 % sodium chloride infusion   Intravenous Continuous PRN Rexene Alberts, MD      . 0.9 %  sodium chloride infusion   Intravenous Continuous Rexene Alberts, MD      . Derrill Memo ON 02/06/2017] 0.9 %  sodium chloride infusion  250 mL Intravenous Continuous Rexene Alberts, MD      . Derrill Memo ON 02/06/2017] acetaminophen (TYLENOL) tablet 1,000 mg  1,000 mg Oral Q6H Darylene Price  H, MD       Or  . Derrill Memo ON 02/06/2017] acetaminophen (TYLENOL) solution 1,000 mg  1,000 mg Per Tube Q6H Rexene Alberts, MD      . acetaminophen (TYLENOL) solution 650 mg  650 mg Per Tube Once Rexene Alberts, MD       Or  . acetaminophen (TYLENOL) suppository 650 mg  650 mg Rectal Once Rexene Alberts, MD      . albumin human 5 % solution 250 mL  250 mL Intravenous Q15 min PRN Rexene Alberts, MD      . Derrill Memo ON 02/06/2017] aspirin EC tablet 325 mg  325 mg Oral Daily Rexene Alberts, MD       Or  . Derrill Memo ON 02/06/2017] aspirin chewable tablet 324 mg  324 mg Per Tube Daily Rexene Alberts, MD      . cefUROXime (ZINACEF) 1.5 g in dextrose 5 % 50 mL IVPB  1.5 g Intravenous Q12H Rexene Alberts, MD      .  chlorhexidine (PERIDEX) 0.12 % solution 15 mL  15 mL Mouth/Throat NOW Rexene Alberts, MD      . dexmedetomidine (PRECEDEX) 200 mcg in sodium chloride 0.9 % 50 mL (4 mcg/mL) infusion  0-0.7 mcg/kg/hr Intravenous Continuous Rexene Alberts, MD      . DOPamine (INTROPIN) 800 mg in dextrose 5 % 250 mL (3.2 mg/mL) infusion  0-10 mcg/kg/min Intravenous Titrated Rexene Alberts, MD      . EPINEPHrine (ADRENALIN) 1 MG/10ML injection           . EPINEPHrine (ADRENALIN) 1 MG/10ML injection           . EPINEPHrine (ADRENALIN) 4 mg in dextrose 5 % 250 mL (0.016 mg/mL) infusion  0-10 mcg/min Intravenous Titrated Rexene Alberts, MD      . famotidine (PEPCID) IVPB 20 mg premix  20 mg Intravenous Q12H Rexene Alberts, MD      . insulin regular (NOVOLIN R,HUMULIN R) 100 Units in sodium chloride 0.9 % 100 mL (1 Units/mL) infusion   Intravenous Continuous Rexene Alberts, MD      . insulin regular bolus via infusion 0-10 Units  0-10 Units Intravenous TID WC Rexene Alberts, MD      . lactated ringers infusion 500 mL  500 mL Intravenous Once PRN Rexene Alberts, MD      . lactated ringers infusion   Intravenous Continuous Rexene Alberts, MD      . lactated ringers infusion   Intravenous Continuous Rexene Alberts, MD      . magnesium sulfate IVPB 4 g 100 mL  4 g Intravenous Once Rexene Alberts, MD      . metoprolol tartrate (LOPRESSOR) injection 2.5-5 mg  2.5-5 mg Intravenous Q2H PRN Rexene Alberts, MD      . midazolam (VERSED) injection 2 mg  2 mg Intravenous Q1H PRN Rexene Alberts, MD      . morphine 4 MG/ML injection 1-2 mg  1-2 mg Intravenous Q1H PRN Rexene Alberts, MD      . morphine 4 MG/ML injection 1-4 mg  1-4 mg Intravenous Q1H PRN Rexene Alberts, MD      . nitroGLYCERIN 50 mg in dextrose 5 % 250 mL (0.2 mg/mL) infusion  0-100 mcg/min Intravenous Titrated Rexene Alberts, MD      . norepinephrine (LEVOPHED) 4 mg in dextrose 5 % 250 mL (0.016 mg/mL) infusion  0-10  mcg/min Intravenous  Titrated Rexene Alberts, MD      . ondansetron [facility]) injection 4 mg  4 mg Intravenous Q6H PRN Rexene Alberts, MD      . Derrill Memo ON 02/07/2017] pantoprazole (PROTONIX) EC tablet 40 mg  40 mg Oral Daily Rexene Alberts, MD      . phenylephrine (NEO-SYNEPHRINE) 20 mg in sodium chloride 0.9 % 250 mL (0.08 mg/mL) infusion  0-100 mcg/min Intravenous Titrated Rexene Alberts, MD      . potassium chloride 10 mEq in 50 mL *CENTRAL LINE* IVPB  10 mEq Intravenous Q1 Hr x 3 Rexene Alberts, MD      . Derrill Memo ON 02/06/2017] sodium chloride flush (NS) 0.9 % injection 3 mL  3 mL Intravenous Q12H Rexene Alberts, MD      . Derrill Memo ON 02/06/2017] sodium chloride flush (NS) 0.9 % injection 3 mL  3 mL Intravenous PRN Rexene Alberts, MD      . vancomycin (VANCOCIN) IVPB 1000 mg/200 mL premix  1,000 mg Intravenous Once Rexene Alberts, MD        REVIEW OF SYSTEMS:    Unable to obtain secondary to patient being on responsive PHYSICAL EXAM:   Vitals:   02/05/17 1237 02/05/17 1245 02/05/17 1300 02/05/17 1513  BP:  (!) 72/36 110/65   Pulse:  (!) 130 (!) 129   Resp:  (!) 28 (!) 30   Temp:      TempSrc:      SpO2: 100% 100% 99% 98%  Weight:      Height:         CARDIAC: There is a regular rate and rhythm, Tachycardic VASCULAR: Significant ecchymosis in the left groin, the area appears soft.  She has a palpable left pedal pulse PULMONARY: Nonlabored respirations ABDOMEN: Soft and non-tender with normal pitched bowel sounds.  MUSCULOSKELETAL: There are no major deformities or cyanosis. SKIN: There are no ulcers or rashes noted.   STUDIES:   None  ASSESSMENT and PLAN   Left groin hematoma: The patient appears to be somewhat stable with manual pressure.  She had a 48 French sheath placed in the left groin.  Pro-glide devices were used for pre-closure, however they were unsuccessful and ultimately a 8 [place] Angioseal was deployed in the groin became hemostatic.  Manual pressure was being held in  the ICU when she became hypotensive.  She seems to respond to pressure and volume replacement.  The area does not appear to be expanding at this time.  After discussing with the cardiac team, we have elected to continue to observe the area, as she appears to be stabilizing with manual pressure and volume.  If things change, she would need operative exploration.   [name], MD [facility] [phone] Pager [phone]

## 2017-02-05 NOTE — Anesthesia Procedure Notes (Addendum)
Central Venous Catheter Insertion Performed by: Suzette Battiest, anesthesiologist Start/End9/03/2017 1:45 PM, 02/05/2017 2:00 PM Patient location: Pre-op. Preanesthetic checklist: patient identified, IV checked, site marked, risks and benefits discussed, surgical consent, monitors and equipment checked, pre-op evaluation, timeout performed and anesthesia consent Position: Trendelenburg Lidocaine 1% used for infiltration and patient sedated Hand hygiene performed , maximum sterile barriers used  and Seldinger technique used Catheter size: 8.5 Fr Total catheter length 10. Central line was placed.Sheath introducer Swan type:thermodilution PA Cath depth:50 Procedure performed using ultrasound guided technique. Ultrasound Notes:anatomy identified, needle tip was noted to be adjacent to the nerve/plexus identified, no ultrasound evidence of intravascular and/or intraneural injection and image(s) printed for medical record Attempts: 5 or more Following insertion, line sutured and dressing applied. Post procedure assessment: blood return through all ports, free fluid flow and no air  Patient tolerated the procedure well with no immediate complications.

## 2017-02-05 NOTE — Progress Notes (Signed)
Patient admitted to [facility] 09 from [facility].  Patient with low blood pressure noted upon arrival.  Rt. Groin checked with Smokey and soft level 0.  Lt. Groin checked and hard upon arrival with pandus noted to have a 3+ inch shelf.  Pointed out to [facility], stated that it was like that upon leaving the cath lab.  No changes.  Will continue to monitor.  Levophed restarted upon arrival as well as blood hung as per orders.  Will continue to monitor.

## 2017-02-05 NOTE — Anesthesia Procedure Notes (Signed)
Central Venous Catheter Insertion Performed by: Duane Boston, anesthesiologist Start/End9/03/2017 6:31 AM, 02/05/2017 6:41 AM Patient location: Pre-op. Preanesthetic checklist: patient identified, IV checked, site marked, risks and benefits discussed, surgical consent, monitors and equipment checked, pre-op evaluation, timeout performed and anesthesia consent Position: Trendelenburg Lidocaine 1% used for infiltration and patient sedated Hand hygiene performed , maximum sterile barriers used  and Seldinger technique used Catheter size: 8 Fr Total catheter length 16. Central line was placed.Double lumen Procedure performed using ultrasound guided technique. Ultrasound Notes:image(s) printed for medical record Attempts: 1 Following insertion, dressing applied, line sutured and Biopatch. Post procedure assessment: blood return through all ports, free fluid flow and no air  Patient tolerated the procedure well with no immediate complications.

## 2017-02-05 NOTE — Progress Notes (Signed)
S/P repair of bleeding left groin.  Off Pressors Remains intubated Wound vac functioning properly Feet well perfused  Continue with supportive care.   [name]

## 2017-02-05 NOTE — Op Note (Signed)
CARDIOTHORACIC SURGERY OPERATIVE NOTE  Date of Procedure:   02/05/2017  Preoperative Diagnosis:  Bleeding and retroperitoneal hematoma from left femoral artery  Postoperative Diagnosis:  same  Procedure:    Exploration of left femoral artery, evacuation of retroperitoneal hematoma, and primary repair of left femoral artery  Co-Surgeons:   [name], MD and Valentina Gu. Roxy Manns, MD  Assistant:    Gerri Lins, PA-C  Anesthesia:    Suzette Battiest, MD  Operative Findings:   Massive left femoral and retroperitoneal hematoma  Laceration of left common femoral artery      BRIEF CLINICAL NOTE AND INDICATIONS FOR SURGERY  Patient is a 81 year old female with multiple medical problems who underwent transcatheter aortic valve replacement via percutaneous left transfemoral approach on the morning of 02/05/2017. Shortly after arrival in the surgical intensive care unit the patient became hypotensive and tachycardic and was noted to have enlarging hematoma in the left groin. Manual pressure on the groin was obtained and held for an extended period time, during which time the patient was resuscitated using multiple units of packed red blood cells for acute blood loss anemia. The patient was intubated and placed on mechanical ventilation. She was seen in consultation by the vascular surgical team.  Although initially the patient appeared to stabilize, during the subsequent 2 hours the patient remained hemodynamically unstable requiring aggressive resuscitation with transfusion of blood products, volume administration, and administration of pressors to maintain adequate blood pressure. Examination of the groin revealed enlarging hematoma with ecchymosis extending up the flank consistent with likely massive retroperitoneal hematoma.  Bedside transthoracic echocardiogram revealed no pericardial effusion, normal functioning bioprosthetic tissue valve in the aortic position, and no other complicating  features. Preparations are made to return the patient emergently to the operating room for surgical exploration and repair.   DETAILS OF THE OPERATIVE PROCEDURE  The patient is brought directly from the surgical intensive care unit to the operating room on the above mentioned date.  The patient is placed in the supine position on the operative table and continued aggressive resuscitation with administration of volume and blood products is performed under the care and direction of the anesthesia team.  [name] abdomen, both groins, and both anterior thighs are rapidly prepared and draped. Vertical incision is made overlying the left groin and extending up above the inguinal crease along the lower aspect of the patient's large abdominal pannus. Blunt dissection is utilized to open the large femoral hematoma and a large amount of bright red blood and old clot is evacuated. Through the incision blunt dissection is utilized to develop the planes down to the femoral artery and manual pressure is held on the external iliac artery to control ongoing blood loss. Retractors are placed to facilitate exposure.  Site of bleeding is identified along the anterior surface of the left common femoral artery immediately above the bifurcation. Once control of the bleeding was ascertained the patient's hemodynamics stabilized fairly quickly. At this juncture Dr. Trula Slade assumes the role as primary surgeon to proceed with direct primary repair of the patient's femoral artery with subsequent evacuation of massive retroperitoneal hematoma.  The details of the remainder of the operative procedure but documented in a separate note by Dr. Trula Slade.  Following completion of the procedure the patient is transported back to the surgical intensive care unit intubated, sedated, in hemodynamically stable but critical condition.  [name] family is advised of the developments of the afternoon and findings in the operating room. All  questions answered. There  were no intraoperative consultations.    Valentina Gu. Roxy Manns MD 02/05/2017 2:59 PM

## 2017-02-05 NOTE — Anesthesia Preprocedure Evaluation (Addendum)
Anesthesia Evaluation  Patient identified by MRN, date of birth, ID band Patient awake    Reviewed: Allergy & Precautions, H&P , NPO status , Patient's Chart, lab work & pertinent test resultsPreop documentation limited or incomplete due to emergent nature of procedure.  Airway Mallampati: III  TM Distance: >3 FB Neck ROM: Full    Dental no notable dental hx. (+) Poor Dentition, Dental Advisory Given   Pulmonary neg pulmonary ROS,    Pulmonary exam normal breath sounds clear to auscultation       Cardiovascular Exercise Tolerance: Good hypertension, Pt. on medications and Pt. on home beta blockers + angina + CAD, + Cardiac Stents and +CHF  + Valvular Problems/Murmurs AS  Rhythm:Regular Rate:Normal + Systolic murmurs    Neuro/Psych negative neurological ROS  negative psych ROS   GI/Hepatic negative GI ROS, Neg liver ROS,   Endo/Other  diabetesMorbid obesity  Renal/GU Renal InsufficiencyRenal disease  negative genitourinary   Musculoskeletal  (+) Arthritis ,   Abdominal   Peds  Hematology negative hematology ROS (+)   Anesthesia Other Findings   Reproductive/Obstetrics negative OB ROS                            Anesthesia Physical  Anesthesia Plan  ASA: IV and emergent  Anesthesia Plan: General   Post-op Pain Management:    Induction: Intravenous  PONV Risk Score and Plan: 3 and Ondansetron, Midazolam, Treatment may vary due to age or medical condition and Propofol infusion  Airway Management Planned: Oral ETT  Additional Equipment: Arterial line, CVP and Ultrasound Guidance Line Placement  Intra-op Plan:   Post-operative Plan: Post-operative intubation/ventilation  Informed Consent: I have reviewed the patients History and Physical, chart, labs and discussed the procedure including the risks, benefits and alternatives for the proposed anesthesia with the patient or authorized  representative who has indicated his/her understanding and acceptance.   Dental advisory given  Plan Discussed with: CRNA and Surgeon  Anesthesia Plan Comments:        Anesthesia Quick Evaluation

## 2017-02-05 NOTE — Consult Note (Signed)
PULMONARY / CRITICAL CARE MEDICINE   Name: Carrie Monroe MRN: [id number] DOB: [birth year]    ADMISSION DATE:  02/05/2017 CONSULTATION DATE: 02/05/2017  REFERRING MD:  Dr. Angelena Form  CHIEF COMPLAINT:  Shock, resp failure  HISTORY OF PRESENT ILLNESS:  Patient is encephalopathic and/or intubated. Therefore history has been obtained from chart review. 81 year old female with PMH as below, which is significant for severe AS, CKD III, CAD, DM, and breast Ca s/p lumpectomy and radiation.  She recently underwent LHC with successful stenting of LAD and RCA 8/3. She presented to [facility]/11 for elective TAVR. The procedure itself was without complication, however, there was some difficulty achieving hemostasis at groin site and angioseal was used. Hgb drop to 6.1 noted post op. Blood transfusion was given and she was sent to ICU for recovery. Soon after arrival to ICU L femoral hematoma expanded and patient became shocky requiring intubation and return trip to OR for exploration and repair of L femoral site. She underwent evacuation of RP hematoma and repair of left femoral artery ruptures pseudoaneurysm. She required large amount of blood products for perioperative resuscitation including at least 18 units PRBC, 10 units FFP, and 3 units platelets. Post-operatively she was sent to ICU for recovery. PCCM asked to see.   PAST MEDICAL HISTORY :  She  has a past medical history of Aortic stenosis, severe; Arthritis; Breast cancer ([place]); Carotid artery stenosis; Chronic diastolic CHF (congestive heart failure) ([place]); CKD (chronic kidney disease); Coronary artery disease; Diabetes mellitus; Dyspnea; Exogenous obesity; Gout; Hemorrhoids; History of kidney stones; Hyperlipidemia; Hypertension; and S/P TAVR (transcatheter aortic valve replacement) (02/05/2017).  PAST SURGICAL HISTORY: She  has a past surgical history that includes Breast lumpectomy (2009); Cholecystectomy (2010); Shoulder surgery; Knee surgery;  US ECHOCARDIOGRAPHY (05/15/2010); SKIN GRAFT TO RIGHT HAND; Eye surgery; Total knee arthroplasty (Left, 08/03/2013); Breast lumpectomy with needle localization (Left, 01/06/2015); LEFT HEART CATH AND CORONARY ANGIOGRAPHY (N/A, 12/17/2016); Cataract extraction; Multiple extractions with alveoloplasty (N/A, 12/21/2016); and CORONARY STENT INTERVENTION (N/A, 12/28/2016).  No Known Allergies  No current facility-administered medications on file prior to encounter.    Current Outpatient Prescriptions on File Prior to Encounter  Medication Sig  . aspirin 81 MG chewable tablet Chew 1 tablet (81 mg total) by mouth daily.  . clopidogrel (PLAVIX) 75 MG tablet Take 1 tablet (75 mg total) by mouth daily with breakfast.  . furosemide (LASIX) 40 MG tablet Take 1 tablet (40 mg total) by mouth daily. Take half tablet by mouth daily as needed for swelling (Patient taking differently: Take 40 mg by mouth daily as needed for fluid. )  . hydrALAZINE (APRESOLINE) 50 MG tablet Take 1 tablet (50 mg total) by mouth every 8 (eight) hours. (Patient taking differently: Take 50 mg by mouth 2 (two) times daily. )  . metoprolol tartrate (LOPRESSOR) 25 MG tablet Take 1 tablet (25 mg total) by mouth 2 (two) times daily.  . rosuvastatin (CRESTOR) 10 MG tablet Take 10 mg by mouth daily.   . tamoxifen (NOLVADEX) 20 MG tablet Take 1 tablet by mouth every morning.  Marland Kitchen allopurinol (ZYLOPRIM) 300 MG tablet Take 300 mg by mouth daily as needed (gout).    FAMILY HISTORY:  Her indicated that her mother is deceased. She indicated that her father is deceased. She indicated that the status of her sister is unknown. She indicated that both of her brothers are deceased. She indicated that her maternal grandmother is deceased. She indicated that her maternal grandfather is deceased.  She indicated that her paternal grandmother is deceased. She indicated that her paternal grandfather is deceased.    SOCIAL HISTORY: She  reports that she has never  smoked. She has never used smokeless tobacco. She reports that she does not drink alcohol or use drugs.  REVIEW OF SYSTEMS:   Unable as patient is encephalopathic and intubated  SUBJECTIVE:    VITAL SIGNS: BP 110/65   Pulse (!) 129   Temp (!) 94.4 F (34.7 C) (Oral)   Resp (!) 30   Ht 5\' 3"  (1.6 m)   Wt 111.6 kg (246 lb)   SpO2 98%   BMI 43.58 kg/m   HEMODYNAMICS:    VENTILATOR SETTINGS: Vent Mode: PRVC FiO2 (%):  [100 %] 100 % Set Rate:  [14 bmp-18 bmp] 18 bmp Vt Set:  [420 mL] 420 mL PEEP:  [0 cmH20-5 cmH20] 5 cmH20 Plateau Pressure:  [18 cmH20] 18 cmH20  INTAKE / OUTPUT: No intake/output data recorded.  PHYSICAL EXAMINATION: General:  Morbidly obese female on vent Neuro:  Sedated HEENT:  [place]/AT, PERRL, no JVD Cardiovascular:  RRR, no MRG Lungs:  Clear bilateral breath sounds Abdomen:  Soft, non-tender, non-distended Musculoskeletal:  No acute deformity, surgical wound dressing L groin Skin:  Grossly intact  LABS:  BMET  Recent Labs Lab 02/01/17 1048  02/05/17 0910 02/05/17 0956  02/05/17 1242 02/05/17 1301 02/05/17 1527  NA 139  < > 142 141  < > 136 140 147*  K 4.0  < > 3.9 3.9  < > 5.7* 5.5* 4.1  CL 108  < > 107 106  --  113*  --   --   CO2 21*  --   --   --   --  12*  --   --   BUN 38*  < > 35* 34*  --  35*  --   --   CREATININE 1.41*  < > 1.50* 1.50*  --  1.70*  --   --   GLUCOSE 165*  < > 253* 257*  < > 439* 416* 291*  < > = values in this interval not displayed.  Electrolytes  Recent Labs Lab 02/01/17 1048 02/05/17 1242  CALCIUM 9.6 6.9*    CBC  Recent Labs Lab 02/05/17 1046 02/05/17 1242 02/05/17 1301 02/05/17 1438 02/05/17 1518 02/05/17 1527  WBC 7.1 18.5*  --   --  9.6  --   HGB 7.9* 14.2 14.3  --  12.3 11.2*  HCT 23.7* 44.2 42.0  --  37.3 33.0*  PLT 105* 82*  --  45* 44*  --     Coag's  Recent Labs Lab 02/05/17 1046 02/05/17 1242 02/05/17 1438 02/05/17 1518  APTT 27  --  50* 53*  INR 1.43 1.72 1.60 1.38     Sepsis Markers No results for input(s): LATICACIDVEN, PROCALCITON, O2SATVEN in the last 168 hours.  ABG  Recent Labs Lab 02/01/17 1116 02/05/17 1049 02/05/17 1545  PHART 7.446 7.411 7.272*  PCO2ART 34.2 37.8 50.3*  PO2ART 122* 181.0* 267.0*    Liver Enzymes  Recent Labs Lab 02/01/17 1048 02/05/17 1242  AST 21 27  ALT 15 11*  ALKPHOS 59 37*  BILITOT 0.7 0.6  ALBUMIN 3.6 2.0*    Cardiac Enzymes No results for input(s): TROPONINI, PROBNP in the last 168 hours.  Glucose  Recent Labs Lab 02/01/17 0948  GLUCAP 171*    Imaging Dg Abd 1 View  Result Date: 02/05/2017 CLINICAL DATA:  Postop. Evaluate for foreign body.  Evacuation of retroperitoneal hematoma. EXAM: ABDOMEN - 1 VIEW COMPARISON:  CT 01/10/2017 FINDINGS: Surgical clip noted lateral to the acetabulum. No additional radiopaque foreign body. Nonobstructive bowel gas pattern. Mild degenerative changes in the hips. No acute bony abnormality. IMPRESSION: No visible unexpected radiopaque foreign body. Electronically Signed   By: Rolm Baptise M.D.   On: 02/05/2017 15:10   Dg Chest Port 1 View  Result Date: 02/05/2017 CLINICAL DATA:  Postop for TAVR. EXAM: PORTABLE CHEST 1 VIEW COMPARISON:  Not 2018, earlier the same day FINDINGS: 1551 hours. Leftward patient rotation. Endotracheal tube tip is 8 mm above the base of the carina and could be retracted 10-15 mm for more appropriate positioning. NG tube appears to be looped upon itself with the tip positioned in the distal esophagus and directed cranially. Left IJ sheath is evident. A right IJ central line tip projects over the expected location of the mid SVC. Cardiopericardial silhouette is enlarged. There is vascular congestion with bibasilar atelectasis. Bones are diffusely demineralized. Telemetry leads overlie the chest. IMPRESSION: 1. Endotracheal tube tip is 8 mm above the base of the carina. 2. NG tube tip is looped upon itself with the tip directed cranially and  positioned in the distal esophagus. 3. Cardiomegaly with vascular congestion and bibasilar atelectasis. I discussed these findings by telephone with the patient's nurse, Altha Harm , at 1610 hours on 02/05/2017. Electronically Signed   By: Misty Stanley M.D.   On: 02/05/2017 16:11   Dg Chest Port 1 View  Result Date: 02/05/2017 CLINICAL DATA:  Status post TAVR. EXAM: PORTABLE CHEST 1 VIEW COMPARISON:  Chest x-ray dated February 01, 2017. FINDINGS: Interval TAVR. Right internal jugular central venous catheter with the tip projecting over the distal SVC. Stable cardiomegaly. Mild pulmonary vascular congestion. Atherosclerotic calcification of the aortic arch. Bibasilar atelectasis. No pneumothorax or pleural effusion. No acute osseous abnormality. IMPRESSION: Interval TAVR. Mild pulmonary vascular congestion with bibasilar atelectasis. Electronically Signed   By: Titus Dubin M.D.   On: 02/05/2017 10:56     STUDIES:    CULTURES:   ANTIBIOTICS: Cefuroxime 9/11 periop > Vancomycin 9/11 periop >  SIGNIFICANT EVENTS: 1/74 TAVR complicated by massive blood loss RP hematoma, repaired in OR.   LINES/TUBES: ETT 9/11 > CVL 9/11 double lumen RIJ 9/11 > Cordis 9/11 LIJ > R rad art line 9/11 >  DISCUSSION: 81 year old female admitted for elective TAVR complicated by RP bleed needing massive transfusion. On vent in ICU.   ASSESSMENT / PLAN:  PULMONARY A: Inability to protect airway in post op setting  P:   Full vent support ABG CXR for ETT placement VAP bundle   CARDIOVASCULAR A:  Shock, hemorrhagic (improved) Severe AS s/p TAVR 9/44 Chronic diastolic CHF HTN  P:  Telemetry monitoring MAP goal > 70mmHg Now off pressors Per cardiology, CVTS  RENAL A:   AKI CKD Hyperkalemia (improved)  P:   KVO Repeat BMP now  GASTROINTESTINAL A:   No acute issues  P:   NPO Protonix for SUP  HEMATOLOGIC A:   Acute blood loss anemia s/p significant transfusion  P:  Hold  further transfusion Repeat CBC Consider lasix  INFECTIOUS A:   Periop abx  P:   Cefuroxime Vanco  ENDOCRINE A:   DM  P:   CBG monitoring and SSI  NEUROLOGIC A:   Acute metabolic encephalopathy  P:   RASS goal: -1 to -2 Precedex for sedation WUA daily   FAMILY  - Updates: family updated by  cardiology  - Inter-disciplinary family meet or Palliative Care meeting due by:  9/18    Georgann Housekeeper, AGACNP-BC [place] [facility] Pager [phone] or [phone]  02/05/2017 5:34 PM

## 2017-02-05 NOTE — Progress Notes (Addendum)
Patient has an small open wound about  4 cm length and width.  It is red around the area and scabbed over.  There is one part of the wound that is open with no scab and it is appears to be blood. It appears to be a pressure sore.  Patient's bottom is also reddened from sitting down at home a lot.  Patient stated that she sits a lot at home.  Will Make Surgeon aware  This was present on admission upon my assessemnt  6:58 AM Spoke with Nell Range Pa about sore on patients bottom

## 2017-02-06 ENCOUNTER — Inpatient Hospital Stay (HOSPITAL_COMMUNITY): Payer: Medicare Other

## 2017-02-06 ENCOUNTER — Encounter (HOSPITAL_COMMUNITY): Payer: Self-pay | Admitting: Cardiovascular Disease

## 2017-02-06 DIAGNOSIS — D62 Acute posthemorrhagic anemia: Secondary | ICD-10-CM

## 2017-02-06 DIAGNOSIS — I350 Nonrheumatic aortic (valve) stenosis: Secondary | ICD-10-CM

## 2017-02-06 DIAGNOSIS — R578 Other shock: Secondary | ICD-10-CM

## 2017-02-06 DIAGNOSIS — J9601 Acute respiratory failure with hypoxia: Secondary | ICD-10-CM

## 2017-02-06 DIAGNOSIS — Z954 Presence of other heart-valve replacement: Secondary | ICD-10-CM

## 2017-02-06 DIAGNOSIS — J9611 Chronic respiratory failure with hypoxia: Secondary | ICD-10-CM

## 2017-02-06 LAB — GLUCOSE, CAPILLARY
GLUCOSE-CAPILLARY: 159 mg/dL — AB (ref 65–99)
GLUCOSE-CAPILLARY: 171 mg/dL — AB (ref 65–99)
GLUCOSE-CAPILLARY: 122 mg/dL — AB (ref 65–99)
GLUCOSE-CAPILLARY: 114 mg/dL — AB (ref 65–99)
GLUCOSE-CAPILLARY: 115 mg/dL — AB (ref 65–99)
GLUCOSE-CAPILLARY: 108 mg/dL — AB (ref 65–99)
GLUCOSE-CAPILLARY: 96 mg/dL (ref 65–99)
GLUCOSE-CAPILLARY: 87 mg/dL (ref 65–99)
GLUCOSE-CAPILLARY: 162 mg/dL — AB (ref 65–99)
GLUCOSE-CAPILLARY: 142 mg/dL — AB (ref 65–99)
Glucose-Capillary: 102 mg/dL — ABNORMAL HIGH (ref 65–99)
Glucose-Capillary: 106 mg/dL — ABNORMAL HIGH (ref 65–99)
Glucose-Capillary: 110 mg/dL — ABNORMAL HIGH (ref 65–99)
Glucose-Capillary: 141 mg/dL — ABNORMAL HIGH (ref 65–99)
Glucose-Capillary: 146 mg/dL — ABNORMAL HIGH (ref 65–99)
Glucose-Capillary: 148 mg/dL — ABNORMAL HIGH (ref 65–99)
Glucose-Capillary: 93 mg/dL (ref 65–99)
Glucose-Capillary: 99 mg/dL (ref 65–99)

## 2017-02-06 LAB — BPAM CRYOPRECIPITATE
Blood Product Expiration Date: 201809111934
Blood Product Expiration Date: 201809111945
ISSUE DATE / TIME: 201809111346
UNIT TYPE AND RH: 6200
Unit Type and Rh: 6200

## 2017-02-06 LAB — POCT I-STAT 3, ART BLOOD GAS (G3+)
Acid-Base Excess: 2 mmol/L (ref 0.0–2.0)
BICARBONATE: 24.8 mmol/L (ref 20.0–28.0)
Bicarbonate: 25.9 mmol/L (ref 20.0–28.0)
O2 Saturation: 100 %
O2 Saturation: 99 %
PH ART: 7.473 — AB (ref 7.350–7.450)
PH ART: 7.425 (ref 7.350–7.450)
Patient temperature: 97.8
TCO2: 27 mmol/L (ref 22–32)
TCO2: 26 mmol/L (ref 22–32)
pCO2 arterial: 35.3 mmHg (ref 32.0–48.0)
pCO2 arterial: 37.7 mmHg (ref 32.0–48.0)
pO2, Arterial: 176 mmHg — ABNORMAL HIGH (ref 83.0–108.0)
pO2, Arterial: 121 mmHg — ABNORMAL HIGH (ref 83.0–108.0)

## 2017-02-06 LAB — PREPARE FRESH FROZEN PLASMA
UNIT DIVISION: 0
UNIT DIVISION: 0
UNIT DIVISION: 0
UNIT DIVISION: 0
UNIT DIVISION: 0
UNIT DIVISION: 0
UNIT DIVISION: 0
UNIT DIVISION: 0
UNIT DIVISION: 0
UNIT DIVISION: 0
UNIT DIVISION: 0
UNIT DIVISION: 0
UNIT DIVISION: 0
UNIT DIVISION: 0
UNIT DIVISION: 0
UNIT DIVISION: 0
Unit division: 0
Unit division: 0
Unit division: 0
Unit division: 0
Unit division: 0
Unit division: 0
Unit division: 0
Unit division: 0
Unit division: 0
Unit division: 0
Unit division: 0
Unit division: 0
Unit division: 0
Unit division: 0
Unit division: 0

## 2017-02-06 LAB — POCT I-STAT, CHEM 8
BUN: 33 mg/dL — ABNORMAL HIGH (ref 6–20)
Calcium, Ion: 1.19 mmol/L (ref 1.15–1.40)
Chloride: 109 mmol/L (ref 101–111)
Creatinine, Ser: 2 mg/dL — ABNORMAL HIGH (ref 0.44–1.00)
Glucose, Bld: 136 mg/dL — ABNORMAL HIGH (ref 65–99)
HEMATOCRIT: 22 % — AB (ref 36.0–46.0)
HEMOGLOBIN: 7.5 g/dL — AB (ref 12.0–15.0)
POTASSIUM: 4.2 mmol/L (ref 3.5–5.1)
SODIUM: 143 mmol/L (ref 135–145)
TCO2: 24 mmol/L (ref 22–32)

## 2017-02-06 LAB — BPAM FFP
BLOOD PRODUCT EXPIRATION DATE: 201809142359
BLOOD PRODUCT EXPIRATION DATE: 201809152359
BLOOD PRODUCT EXPIRATION DATE: 201809152359
BLOOD PRODUCT EXPIRATION DATE: 201809152359
BLOOD PRODUCT EXPIRATION DATE: 201809152359
BLOOD PRODUCT EXPIRATION DATE: 201809152359
BLOOD PRODUCT EXPIRATION DATE: 201809152359
BLOOD PRODUCT EXPIRATION DATE: 201809152359
BLOOD PRODUCT EXPIRATION DATE: 201809152359
BLOOD PRODUCT EXPIRATION DATE: 201809152359
BLOOD PRODUCT EXPIRATION DATE: 201809162359
BLOOD PRODUCT EXPIRATION DATE: 201809162359
BLOOD PRODUCT EXPIRATION DATE: 201809162359
BLOOD PRODUCT EXPIRATION DATE: 201809162359
BLOOD PRODUCT EXPIRATION DATE: 201809192359
BLOOD PRODUCT EXPIRATION DATE: 201809242359
BLOOD PRODUCT EXPIRATION DATE: 201809242359
Blood Product Expiration Date: 201809152359
Blood Product Expiration Date: 201809152359
Blood Product Expiration Date: 201809152359
Blood Product Expiration Date: 201809152359
Blood Product Expiration Date: 201809152359
Blood Product Expiration Date: 201809152359
Blood Product Expiration Date: 201809152359
Blood Product Expiration Date: 201809162359
Blood Product Expiration Date: 201809192359
Blood Product Expiration Date: 201810022359
Blood Product Expiration Date: 201810032359
Blood Product Expiration Date: 201809162359
Blood Product Expiration Date: 201809162359
Blood Product Expiration Date: 201809162359
ISSUE DATE / TIME: 201809111305
ISSUE DATE / TIME: 201809111305
ISSUE DATE / TIME: 201809111305
ISSUE DATE / TIME: 201809111319
ISSUE DATE / TIME: 201809111319
ISSUE DATE / TIME: 201809111332
ISSUE DATE / TIME: 201809111332
ISSUE DATE / TIME: 201809111335
ISSUE DATE / TIME: 201809111335
ISSUE DATE / TIME: 201809111335
ISSUE DATE / TIME: 201809111335
ISSUE DATE / TIME: 201809111343
ISSUE DATE / TIME: 201809111354
ISSUE DATE / TIME: 201809111354
ISSUE DATE / TIME: 201809111357
ISSUE DATE / TIME: 201809111357
ISSUE DATE / TIME: 201809112227
ISSUE DATE / TIME: 201809112227
ISSUE DATE / TIME: 201809111251
ISSUE DATE / TIME: 201809111251
ISSUE DATE / TIME: 201809111305
ISSUE DATE / TIME: 201809111319
ISSUE DATE / TIME: 201809111319
ISSUE DATE / TIME: 201809111343
ISSUE DATE / TIME: 201809111348
ISSUE DATE / TIME: 201809112127
ISSUE DATE / TIME: 201809112127
ISSUE DATE / TIME: 201809112141
UNIT TYPE AND RH: 600
UNIT TYPE AND RH: 600
UNIT TYPE AND RH: 600
UNIT TYPE AND RH: 600
UNIT TYPE AND RH: 600
UNIT TYPE AND RH: 6200
UNIT TYPE AND RH: 6200
UNIT TYPE AND RH: 6200
UNIT TYPE AND RH: 6200
UNIT TYPE AND RH: 6200
UNIT TYPE AND RH: 6200
UNIT TYPE AND RH: 6200
UNIT TYPE AND RH: 6200
UNIT TYPE AND RH: 6200
Unit Type and Rh: 600
Unit Type and Rh: 600
Unit Type and Rh: 6200
Unit Type and Rh: 6200
Unit Type and Rh: 6200
Unit Type and Rh: 6200
Unit Type and Rh: 6200
Unit Type and Rh: 6200
Unit Type and Rh: 6200
Unit Type and Rh: 6200
Unit Type and Rh: 6200
Unit Type and Rh: 6200
Unit Type and Rh: 6200
Unit Type and Rh: 6200
Unit Type and Rh: 6200
Unit Type and Rh: 6200
Unit Type and Rh: 6200

## 2017-02-06 LAB — BASIC METABOLIC PANEL
Anion gap: 4 — ABNORMAL LOW (ref 5–15)
BUN: 32 mg/dL — AB (ref 6–20)
CHLORIDE: 116 mmol/L — AB (ref 101–111)
CO2: 24 mmol/L (ref 22–32)
CREATININE: 1.74 mg/dL — AB (ref 0.44–1.00)
Calcium: 8.4 mg/dL — ABNORMAL LOW (ref 8.9–10.3)
GFR calc non Af Amer: 27 mL/min — ABNORMAL LOW (ref >60)
GFR, EST AFRICAN AMERICAN: 31 mL/min — AB (ref >60)
Glucose, Bld: 122 mg/dL — ABNORMAL HIGH (ref 65–99)
Potassium: 4.1 mmol/L (ref 3.5–5.1)
Sodium: 144 mmol/L (ref 135–145)

## 2017-02-06 LAB — BPAM PLATELET PHERESIS
BLOOD PRODUCT EXPIRATION DATE: 201809132359
BLOOD PRODUCT EXPIRATION DATE: 201809132359
ISSUE DATE / TIME: 201809111533
ISSUE DATE / TIME: 201809111332
UNIT TYPE AND RH: 5100
Unit Type and Rh: 6200

## 2017-02-06 LAB — CREATININE, SERUM
Creatinine, Ser: 2.08 mg/dL — ABNORMAL HIGH (ref 0.44–1.00)
GFR calc non Af Amer: 22 mL/min — ABNORMAL LOW (ref >60)
GFR, EST AFRICAN AMERICAN: 25 mL/min — AB (ref >60)

## 2017-02-06 LAB — CBC
HCT: 28.4 % — ABNORMAL LOW (ref 36.0–46.0)
HCT: 25.3 % — ABNORMAL LOW (ref 36.0–46.0)
HEMOGLOBIN: 9.9 g/dL — AB (ref 12.0–15.0)
Hemoglobin: 8.7 g/dL — ABNORMAL LOW (ref 12.0–15.0)
MCH: 29.6 pg (ref 26.0–34.0)
MCH: 29.4 pg (ref 26.0–34.0)
MCHC: 34.9 g/dL (ref 30.0–36.0)
MCHC: 34.4 g/dL (ref 30.0–36.0)
MCV: 84.8 fL (ref 78.0–100.0)
MCV: 85.5 fL (ref 78.0–100.0)
Platelets: 60 10*3/uL — ABNORMAL LOW (ref 150–400)
Platelets: 55 10*3/uL — ABNORMAL LOW (ref 150–400)
RBC: 3.35 MIL/uL — ABNORMAL LOW (ref 3.87–5.11)
RBC: 2.96 MIL/uL — ABNORMAL LOW (ref 3.87–5.11)
RDW: 14.5 % (ref 11.5–15.5)
RDW: 15.4 % (ref 11.5–15.5)
WBC: 13.2 10*3/uL — ABNORMAL HIGH (ref 4.0–10.5)
WBC: 17.1 10*3/uL — AB (ref 4.0–10.5)

## 2017-02-06 LAB — PREPARE PLATELET PHERESIS
UNIT DIVISION: 0
Unit division: 0

## 2017-02-06 LAB — PREPARE RBC (CROSSMATCH)

## 2017-02-06 LAB — ECHOCARDIOGRAM COMPLETE
Height: 63 in
Weight: 4433.89 oz

## 2017-02-06 LAB — MASSIVE TRANSFUSION PROTOCOL ORDER (BLOOD BANK NOTIFICATION)

## 2017-02-06 LAB — PREPARE CRYOPRECIPITATE
UNIT DIVISION: 0
Unit division: 0

## 2017-02-06 LAB — MAGNESIUM
Magnesium: 1.5 mg/dL — ABNORMAL LOW (ref 1.7–2.4)
Magnesium: 2.3 mg/dL (ref 1.7–2.4)

## 2017-02-06 MED ORDER — INSULIN ASPART 100 UNIT/ML ~~LOC~~ SOLN
0.0000 [IU] | SUBCUTANEOUS | Status: DC
  Administered 2017-02-06 (×3): 2 [IU] via SUBCUTANEOUS
  Administered 2017-02-07: 4 [IU] via SUBCUTANEOUS
  Administered 2017-02-07: 8 [IU] via SUBCUTANEOUS
  Administered 2017-02-07 (×2): 2 [IU] via SUBCUTANEOUS
  Administered 2017-02-07 – 2017-02-08 (×2): 4 [IU] via SUBCUTANEOUS
  Administered 2017-02-08: 2 [IU] via SUBCUTANEOUS
  Administered 2017-02-08: 4 [IU] via SUBCUTANEOUS
  Administered 2017-02-08: 8 [IU] via SUBCUTANEOUS
  Administered 2017-02-08: 4 [IU] via SUBCUTANEOUS
  Administered 2017-02-09: 2 [IU] via SUBCUTANEOUS
  Administered 2017-02-09: 4 [IU] via SUBCUTANEOUS
  Administered 2017-02-09: 2 [IU] via SUBCUTANEOUS
  Administered 2017-02-09: 4 [IU] via SUBCUTANEOUS
  Administered 2017-02-09 (×2): 2 [IU] via SUBCUTANEOUS
  Administered 2017-02-10: 8 [IU] via SUBCUTANEOUS
  Administered 2017-02-10: 2 [IU] via SUBCUTANEOUS
  Administered 2017-02-10 (×2): 4 [IU] via SUBCUTANEOUS
  Administered 2017-02-10 – 2017-02-11 (×2): 2 [IU] via SUBCUTANEOUS
  Administered 2017-02-11: 4 [IU] via SUBCUTANEOUS
  Administered 2017-02-11: 8 [IU] via SUBCUTANEOUS
  Administered 2017-02-11: 2 [IU] via SUBCUTANEOUS
  Administered 2017-02-11: 4 [IU] via SUBCUTANEOUS
  Administered 2017-02-11: 8 [IU] via SUBCUTANEOUS
  Administered 2017-02-11: 2 [IU] via SUBCUTANEOUS
  Administered 2017-02-12: 4 [IU] via SUBCUTANEOUS
  Administered 2017-02-12: 8 [IU] via SUBCUTANEOUS
  Administered 2017-02-12: 2 [IU] via SUBCUTANEOUS
  Administered 2017-02-12 (×2): 4 [IU] via SUBCUTANEOUS
  Administered 2017-02-13 (×3): 2 [IU] via SUBCUTANEOUS
  Administered 2017-02-14: 8 [IU] via SUBCUTANEOUS
  Administered 2017-02-14: 2 [IU] via SUBCUTANEOUS
  Administered 2017-02-14: 4 [IU] via SUBCUTANEOUS
  Administered 2017-02-14 – 2017-02-15 (×2): 2 [IU] via SUBCUTANEOUS

## 2017-02-06 MED ORDER — ORAL CARE MOUTH RINSE
15.0000 mL | Freq: Four times a day (QID) | OROMUCOSAL | Status: DC
  Administered 2017-02-06 – 2017-02-11 (×16): 15 mL via OROMUCOSAL

## 2017-02-06 MED ORDER — SODIUM CHLORIDE 0.9 % IV SOLN
Freq: Once | INTRAVENOUS | Status: AC
  Administered 2017-02-06: 19:00:00 via INTRAVENOUS

## 2017-02-06 MED ORDER — FUROSEMIDE 10 MG/ML IJ SOLN
40.0000 mg | Freq: Two times a day (BID) | INTRAMUSCULAR | Status: DC
  Administered 2017-02-06 – 2017-02-07 (×3): 40 mg via INTRAVENOUS
  Filled 2017-02-06 (×3): qty 4

## 2017-02-06 MED ORDER — PERFLUTREN LIPID MICROSPHERE
1.0000 mL | INTRAVENOUS | Status: AC | PRN
  Administered 2017-02-06: 2 mL via INTRAVENOUS
  Filled 2017-02-06: qty 10

## 2017-02-06 MED ORDER — DEXTROSE 5 % IV SOLN
1.5000 g | Freq: Two times a day (BID) | INTRAVENOUS | Status: AC
  Administered 2017-02-06 – 2017-02-07 (×2): 1.5 g via INTRAVENOUS
  Filled 2017-02-06 (×2): qty 1.5

## 2017-02-06 MED ORDER — FENTANYL CITRATE (PF) 100 MCG/2ML IJ SOLN: 25.0000 ug | INTRAMUSCULAR | Status: DC | PRN

## 2017-02-06 MED ORDER — MAGNESIUM SULFATE 4 GM/100ML IV SOLN
4.0000 g | Freq: Once | INTRAVENOUS | Status: AC
  Administered 2017-02-06: 4 g via INTRAVENOUS
  Filled 2017-02-06: qty 100

## 2017-02-06 MED ORDER — MIDAZOLAM HCL 2 MG/2ML IJ SOLN: 1.0000 mg | INTRAMUSCULAR | Status: DC | PRN

## 2017-02-06 MED ORDER — FUROSEMIDE 10 MG/ML IJ SOLN
40.0000 mg | Freq: Once | INTRAMUSCULAR | Status: AC
  Administered 2017-02-06: 40 mg via INTRAVENOUS
  Filled 2017-02-06: qty 4

## 2017-02-06 MED ORDER — PANTOPRAZOLE SODIUM 40 MG IV SOLR
40.0000 mg | INTRAVENOUS | Status: DC
  Administered 2017-02-06: 40 mg via INTRAVENOUS
  Filled 2017-02-06: qty 40

## 2017-02-06 MED ORDER — CHLORHEXIDINE GLUCONATE 0.12% ORAL RINSE (MEDLINE KIT)
15.0000 mL | Freq: Two times a day (BID) | OROMUCOSAL | Status: DC
  Administered 2017-02-06 – 2017-02-11 (×7): 15 mL via OROMUCOSAL

## 2017-02-06 MED ORDER — INSULIN DETEMIR 100 UNIT/ML ~~LOC~~ SOLN
28.0000 [IU] | Freq: Two times a day (BID) | SUBCUTANEOUS | Status: DC
  Administered 2017-02-06 – 2017-02-27 (×42): 28 [IU] via SUBCUTANEOUS
  Filled 2017-02-06 (×45): qty 0.28

## 2017-02-06 MED ORDER — DEXTROSE 5 % IV SOLN
1.5000 g | Freq: Two times a day (BID) | INTRAVENOUS | Status: DC
  Filled 2017-02-06: qty 1.5

## 2017-02-06 MED FILL — Sodium Chloride IV Soln 0.9%: INTRAVENOUS | Qty: 3000 | Status: AC

## 2017-02-06 MED FILL — Heparin Sodium (Porcine) Inj 1000 Unit/ML: INTRAMUSCULAR | Qty: 30 | Status: AC

## 2017-02-06 NOTE — Progress Notes (Addendum)
[address]       [place] [address]             [phone]        CARDIOTHORACIC SURGERY PROGRESS NOTE   R1 Day Post-Op Procedure(s) (LRB): Evacuation of Retroperitoneal Hematoma and Primary Repair of Femoral Artery and FEMORAL ARTERY EXPLORATION (N/A) APPLICATION OF WOUND VAC (Left)  Subjective: Sedated on vent, reportedly will wake up and follow commands  Objective: Vital signs: BP Readings from Last 1 Encounters:  02/06/17 (!) 121/47   Pulse Readings from Last 1 Encounters:  02/06/17 79   Resp Readings from Last 1 Encounters:  02/06/17 14   Temp Readings from Last 1 Encounters:  02/05/17 98.4 F (36.9 C) (Axillary)    Hemodynamics: CVP:  [4 mmHg-12 mmHg] 6 mmHg  Physical Exam:  Rhythm:   sinus  Breath sounds: clear  Heart sounds:  RRR w/out murmur  Incisions:  Wound VAC intact, draining thin serosanguinous drainage  Abdomen:  Soft, non-distended  Extremities:  Warm, well-perfused, + pulses   Intake/Output from previous day: 09/11 0701 - 09/12 0700 In: 7053.2 [I.V.:5368.2; Blood:1545; NG/GT:90; IV Piggyback:50] Out: 4530 [Urine:980; Emesis/NG output:300; Drains:250; Blood:3000] Intake/Output this shift: Total I/O In: -  Out: 15 [Urine:15]  Lab Results:  CBC: Recent Labs  02/05/17 1518 02/05/17 1527 02/06/17 0302  WBC 9.6  --  13.2*  HGB 12.3 11.2* 9.9*  HCT 37.3 33.0* 28.4*  PLT 44*  --  60*    BMET:  Recent Labs  02/05/17 1857 02/06/17 0302  NA 145 144  K 3.9 4.1  CL 116* 116*  CO2 24 24  GLUCOSE 255* 122*  BUN 35* 32*  CREATININE 1.64* 1.74*  CALCIUM 8.9 8.4*     PT/INR:   Recent Labs  02/05/17 1518  LABPROT 16.8*  INR 1.38    CBG (last 3)   Recent Labs  02/06/17 0405 02/06/17 0509 02/06/17 0616  GLUCAP 114* 106* 110*    ABG    Component Value Date/Time   PHART 7.473 (H) 02/06/2017 0314   PCO2ART 35.3 02/06/2017 0314   PO2ART 176.0 (H) 02/06/2017 0314   HCO3 25.9 02/06/2017 0314   TCO2 27 02/06/2017 0314   ACIDBASEDEF 4.0 (H) 02/05/2017 1545   O2SAT 100.0 02/06/2017 0314    CXR: PORTABLE CHEST 1 VIEW  COMPARISON:  Portable chest x-ray of February 05, 2017  FINDINGS: The lungs are well-expanded. The interstitial markings are less conspicuous. The central pulmonary vascularity remains prominent. There is a trace of pleural fluid on the left. The cardiac silhouette is enlarged. The prosthetic aortic valve and aortic stent appear stable. The endotracheal tube tip projects 3.2 cm above the carina. The esophagogastric tube tip projects below the inferior margin of the image. The right internal jugular venous catheter tip projects over the midportion of the SVC.  IMPRESSION: Decreased pulmonary interstitial edema and pulmonary vascular congestion. Tiny left pleural effusion. The support tubes are in stable position.  Thoracic aortic atherosclerosis.   Electronically Signed   By: David  Martinique M.D.   On: 02/06/2017 07:40  Assessment/Plan: S/P Procedure(s) (LRB): Evacuation of Retroperitoneal Hematoma and Primary Repair of Femoral Artery and FEMORAL ARTERY EXPLORATION (N/A) APPLICATION OF WOUND VAC (Left)  Clinically stable overnight Maintaining NSR w/ stable hemodynamics on low dose levophed for BP support O2 sats 100% on 50% FiO2 and CXR looks clear Renal function stable close to preop baseline w/ adequate UOP Expected post op acute blood loss  anemia, stable overnight Post op thrombocytopenia, improving Chronic diastolic CHF with expected post-op volume excess, weight allegedly 31 lbs > preop   Proceed w/ acute vent wean and possible extubation  Mobilize post extubation  Lasix to stimulate UOP  Add levemir insulin and wean drip off  Restart DAPT once off vent  Routine POD1 ECHO  Wound management and VAC per VVS  I spent in excess of 15 minutes during the conduct of this hospital encounter and >50% of this time involved direct  face-to-face encounter with the patient for counseling and/or coordination of their care.   Rexene Alberts, MD 02/06/2017 8:11 AM

## 2017-02-06 NOTE — Progress Notes (Signed)
  Echocardiogram 2D Echocardiogram has been performed.  Carrie Monroe 02/06/2017, 9:49 AM

## 2017-02-06 NOTE — Progress Notes (Signed)
PULMONARY / CRITICAL CARE MEDICINE   Name: Carrie Monroe MRN: [id number] DOB: 01-26-1936    ADMISSION DATE:  02/05/2017   CONSULTATION DATE: 02/05/2017  REFERRING MD:  Dr. Angelena Form  CHIEF COMPLAINT:  Shock, resp failure  HISTORY OF PRESENT ILLNESS:  Patient is encephalopathic and/or intubated. Therefore history has been obtained from chart review. 81 y.o. female with PMH as below, which is significant for severe AS, CKD III, CAD, DM, and breast Ca s/p lumpectomy and radiation.  She recently underwent LHC with successful stenting of LAD and RCA 8/3. She presented to [facility] 9/11 for elective TAVR. The procedure itself was without complication, however, there was some difficulty achieving hemostasis at groin site and angioseal was used. Hgb drop to 6.1 noted post op. Blood transfusion was given and she was sent to ICU for recovery. Soon after arrival to ICU L femoral hematoma expanded and patient became shocky requiring intubation and return trip to OR for exploration and repair of L femoral site. She underwent evacuation of RP hematoma and repair of left femoral artery ruptures pseudoaneurysm. She required large amount of blood products for perioperative resuscitation including at least 18 units PRBC, 10 units FFP, and 3 units platelets. Post-operatively she was sent to ICU for recovery. PCCM asked to see.   SUBJECTIVE: Patient taken back to the operating room yesterday due to massive blood loss from TAVR placement complication.  REVIEW OF SYSTEMS:  Unable to obtain with patient intubated and sedated.  VITAL SIGNS: BP (!) 121/47   Pulse 79   Temp 98.4 F (36.9 C) (Axillary)   Resp 14   Ht 5\' 3"  (1.6 m)   Wt 277 lb 1.9 oz (125.7 kg)   SpO2 100%   BMI 49.09 kg/m   HEMODYNAMICS: CVP:  [4 mmHg-12 mmHg] 6 mmHg  VENTILATOR SETTINGS: Vent Mode: CPAP;PSV FiO2 (%):  [40 %-100 %] 40 % Set Rate:  [14 bmp-20 bmp] 20 bmp Vt Set:  [420 mL-500 mL] 500 mL PEEP:  [0 cmH20-5 cmH20] 5  cmH20 Pressure Support:  [10 cmH20] 10 cmH20 Plateau Pressure:  [18 cmH20-19 cmH20] 19 cmH20  INTAKE / OUTPUT: I/O last 3 completed shifts: In: 7053.2 [I.V.:5368.2; Blood:1545; NG/GT:90; IV Piggyback:50] Out: 3295 [Urine:980; Emesis/NG output:300; Drains:250; Blood:3000]  PHYSICAL EXAMINATION: General:  No family at bedside. Awake. No distress. Integument:  Warm. Dry. No rash on exposed skin. Wound VAC in place over left groin. Extremities:  No cyanosis or clubbing.  HEENT:  Endotracheal tube in place. No scleral icterus. Moist membranes. Cardiovascular:  Regular rate. Sinus rhythm. Unable to appreciate JVD. Pulmonary:  Normal work of breathing on pressure support 10/5. There bilaterally to auscultation. Abdomen: Soft. Protuberant. Normal bowel sounds. Neurological: Moving all 4 extremities equally. Nods to questions.  LABS:  BMET  Recent Labs Lab 02/05/17 1242  02/05/17 1527 02/05/17 1857 02/06/17 0302  NA 136  < > 147* 145 144  K 5.7*  < > 4.1 3.9 4.1  CL 113*  --   --  116* 116*  CO2 12*  --   --  24 24  BUN 35*  --   --  35* 32*  CREATININE 1.70*  --   --  1.64* 1.74*  GLUCOSE 439*  < > 291* 255* 122*  < > = values in this interval not displayed.  Electrolytes  Recent Labs Lab 02/05/17 1242 02/05/17 1857 02/06/17 0302  CALCIUM 6.9* 8.9 8.4*  MG  --   --  1.5*    CBC  Recent Labs Lab 02/05/17 1242  02/05/17 1438 02/05/17 1518 02/05/17 1527 02/06/17 0302  WBC 18.5*  --   --  9.6  --  13.2*  HGB 14.2  < >  --  12.3 11.2* 9.9*  HCT 44.2  < >  --  37.3 33.0* 28.4*  PLT 82*  --  45* 44*  --  60*  < > = values in this interval not displayed.  Coag's  Recent Labs Lab 02/05/17 1046 02/05/17 1242 02/05/17 1438 02/05/17 1518  APTT 27  --  50* 53*  INR 1.43 1.72 1.60 1.38    Sepsis Markers No results for input(s): LATICACIDVEN, PROCALCITON, O2SATVEN in the last 168 hours.  ABG  Recent Labs Lab 02/05/17 1545 02/05/17 1910 02/06/17 0314   PHART 7.272* 7.408 7.473*  PCO2ART 50.3* 39.8 35.3  PO2ART 267.0* 175.0* 176.0*    Liver Enzymes  Recent Labs Lab 02/01/17 1048 02/05/17 1242 02/05/17 1857  AST 21 27 64*  ALT 15 11* 36  ALKPHOS 59 37* 34*  BILITOT 0.7 0.6 2.1*  ALBUMIN 3.6 2.0* 2.3*    Cardiac Enzymes No results for input(s): TROPONINI, PROBNP in the last 168 hours.  Glucose  Recent Labs Lab 02/06/17 0105 02/06/17 0209 02/06/17 0312 02/06/17 0405 02/06/17 0509 02/06/17 0616  GLUCAP 171* 141* 122* 114* 106* 110*    Imaging Dg Abd 1 View  Result Date: 02/05/2017 CLINICAL DATA:  Postop. Evaluate for foreign body. Evacuation of retroperitoneal hematoma. EXAM: ABDOMEN - 1 VIEW COMPARISON:  CT 01/10/2017 FINDINGS: Surgical clip noted lateral to the acetabulum. No additional radiopaque foreign body. Nonobstructive bowel gas pattern. Mild degenerative changes in the hips. No acute bony abnormality. IMPRESSION: No visible unexpected radiopaque foreign body. Electronically Signed   By: Rolm Baptise M.D.   On: 02/05/2017 15:10   Dg Chest Port 1 View  Result Date: 02/06/2017 CLINICAL DATA:  Intubated patient, status post aortic valve replacement yesterday. EXAM: PORTABLE CHEST 1 VIEW COMPARISON:  Portable chest x-ray of February 05, 2017 FINDINGS: The lungs are well-expanded. The interstitial markings are less conspicuous. The central pulmonary vascularity remains prominent. There is a trace of pleural fluid on the left. The cardiac silhouette is enlarged. The prosthetic aortic valve and aortic stent appear stable. The endotracheal tube tip projects 3.2 cm above the carina. The esophagogastric tube tip projects below the inferior margin of the image. The right internal jugular venous catheter tip projects over the midportion of the SVC. IMPRESSION: Decreased pulmonary interstitial edema and pulmonary vascular congestion. Tiny left pleural effusion. The support tubes are in stable position. Thoracic aortic  atherosclerosis. Electronically Signed   By: David  Martinique M.D.   On: 02/06/2017 07:40   Dg Chest Port 1 View  Result Date: 02/05/2017 CLINICAL DATA:  Postop for TAVR. EXAM: PORTABLE CHEST 1 VIEW COMPARISON:  Not 2018, earlier the same day FINDINGS: 1551 hours. Leftward patient rotation. Endotracheal tube tip is 8 mm above the base of the carina and could be retracted 10-15 mm for more appropriate positioning. NG tube appears to be looped upon itself with the tip positioned in the distal esophagus and directed cranially. Left IJ sheath is evident. A right IJ central line tip projects over the expected location of the mid SVC. Cardiopericardial silhouette is enlarged. There is vascular congestion with bibasilar atelectasis. Bones are diffusely demineralized. Telemetry leads overlie the chest. IMPRESSION: 1. Endotracheal tube tip is 8 mm above the base of the carina. 2. NG tube tip is looped upon itself  with the tip directed cranially and positioned in the distal esophagus. 3. Cardiomegaly with vascular congestion and bibasilar atelectasis. I discussed these findings by telephone with the patient's nurse, Altha Harm , at 1610 hours on 02/05/2017. Electronically Signed   By: Misty Stanley M.D.   On: 02/05/2017 16:11   Dg Chest Port 1 View  Result Date: 02/05/2017 CLINICAL DATA:  Status post TAVR. EXAM: PORTABLE CHEST 1 VIEW COMPARISON:  Chest x-ray dated February 01, 2017. FINDINGS: Interval TAVR. Right internal jugular central venous catheter with the tip projecting over the distal SVC. Stable cardiomegaly. Mild pulmonary vascular congestion. Atherosclerotic calcification of the aortic arch. Bibasilar atelectasis. No pneumothorax or pleural effusion. No acute osseous abnormality. IMPRESSION: Interval TAVR. Mild pulmonary vascular congestion with bibasilar atelectasis. Electronically Signed   By: Titus Dubin M.D.   On: 02/05/2017 10:56   Dg Abd Portable 1v  Result Date: 02/05/2017 CLINICAL DATA:   Encounter for orogastric tube placement EXAM: PORTABLE ABDOMEN - 1 VIEW COMPARISON:  02/05/2017 at 1444 hours FINDINGS: Targeted study of the left hemiabdomen from orogastric tube position. The tip and side port of a gastric tube are identified in the left upper quadrant of the abdomen in the expected location of the proximal stomach. A vascular stent projects over the included heart. Blunting the left costophrenic angle is seen. Surgical clips project over the left breast shadow. IMPRESSION: Gastric tube tip and side-port are in the expected location the stomach. Electronically Signed   By: Ashley Royalty M.D.   On: 02/05/2017 20:12     STUDIES:  TTE 09/04/16:  Moderate LVH with EF 60-65% and normal regional wall motion. Grade 2 diastolic dysfunction. LA moderately dilated & RA upper limits of normal in size. Severe aortic stenosis. Aortic root normal in size. Trivial mitral regurgitation. Mild pulmonic regurgitation. No significant tricuspid regurgitation. No pericardial effusion. PFT 12/20/16: FVC 1.87 L (75%) FEV1 1.59 L (86%) FEV1/FVC 0.85 FEF 25-75 1.86 L (134%) negative bronchodilator response TLC 4.10 L (84%) RV 92% DLCO corrected 53% PORT CXR 9/12:  Personally reviewed by me. Questionable right hilar opacity and possible consolidation with air bronchograms. Continued silhouetting left hemidiaphragm and left lower lung opacification that could represent pleural effusion versus atelectasis versus consolidation. Endotracheal tube in good position. Right and left internal jugular central venous catheter is in good position. Enteric feeding tube coursing below diaphragm.  MICROBIOLOGY: MRSA PCR 9/7:  Negative   ANTIBIOTICS: Cefuroxime 9/11 - 9/13 (PERIOP) Vancomycin 9/12 (PERIOP)  SIGNIFICANT EVENTS: 1/02 - TAVR complicated by massive blood loss RP hematoma, repaired in OR.   LINES/TUBES:  OETT 9/11 >>> R IJ DL CVL 9/11 >>> L IJ CORDIS 9/11 >>> R RAD ART LINE 9/11 >>> FOLEY 9/11 >>> OGT 9/11  >>> PIV  DISCUSSION:  81 y.o. female status post transaortic valve replacement complicated by retroperitoneal bleed with massive transfusion. Patient was reintubated and taken to the OR emergently yesterday for repair.  ASSESSMENT / PLAN:    PULMONARY A: Acute hypoxic respiratory failure: Multifactorial in setting of acute blood loss.  P:   Continuing full ventilator support Daily pressure support weaning and spontaneous breathing trial Continuous pulse oximetry monitoring  CARDIOVASCULAR A:  Shock: Hemorrhagic. Resolved. Severe aortic stenosis: Status post transaortic valve replacement 9/11. Stage II chronic diastolic congestive heart failure History of essential hypertension History of carotid artery stenosis bilaterally  P:  Management per primary service & cardiothoracic surgery  RENAL A:   Acute on chronic renal failure: Unclear renal stage. Mild. Hypomagnesemia:  Replaced. Hyperkalemia: Resolved. Hyperchloremia: Likely due to volume resuscitation. Mild.  P:   Trending urine output with Foley catheter Maintaining MAP >65 Monitoring electrolytes and renal function daily Ionized calcium pending  GASTROINTESTINAL A:   No acute issues.  P:   Holding on tube feedings.  HEMATOLOGIC A:   Anemia: Secondary to acute blood loss post procedure. Retroperitoneal hematoma. Status post transfusion on 9/11. Thrombocytopenia: Likely significant component of consumption. Leukocytosis: Unlikely secondary to infection. Likely demargination from stress response.  P:  Trending cell counts daily with CBC Transfusion goal as per primary service and other consultants  INFECTIOUS A:   No acute infection.  P:   Perioperative cefuroxime & vancomycin. Plan to culture for fever  ENDOCRINE A:   History of diabetes mellitus: Glucose control.  P:   Management per primary service and cardiothoracic surgery.  NEUROLOGIC A:   Sedation on ventilator  P:   RASS goal: 0 to  -1 Precedex drip as per primary service Fentanyl IV when necessary pain Versed IV when necessary sedation  Prophylaxis:  Changing from PO to IV Protonix daily. SCDs. Diet:  NPO. Holding on tube feedings anticipating extubation in the next 24 hours. Code Status:  Full Code per previous physician discussions. Disposition:  Remains tenuous & critically ill. Family Update:  No family at bedside at the time of my exam.  I have personally spent a total of 31 minutes of critical care time today caring for the patient & reviewing the patient's electronic medical record.  Remainder of care as per primary service and other consultants.  Sonia Baller Ashok Cordia, M.D. [facility] Pager:  [phone] After 3pm or if no response, call [phone] 02/06/2017 8:31 AM

## 2017-02-06 NOTE — Procedures (Signed)
Extubation Procedure Note  Patient Details:   Name: MAHEEN CWIKLA DOB: Jun 29, 1935 MRN: [id number]   Airway Documentation:     Evaluation  O2 sats: stable throughout Complications: No apparent complications Patient did tolerate procedure well. Bilateral Breath Sounds: Clear   Yes   Patient was extubated to a 4L [place] per MD order. Cuff leak was heard. No stridor was noted. Family and RN at bedside with RT during extubation. Patient tolerated well.  Renato Gails [facility] 02/06/2017, 12:07 PM

## 2017-02-06 NOTE — Progress Notes (Signed)
Advanced Heart Failure Rounding Note  Primary Cardiologist: Croituru/McAlhany  Subjective:    Pt underwent elective TAVR procedure 02/05/17. Post op, developed profound hypotension, groin hematoma, and marked anemia. HF team called to assist with profound shock.She required pressor support and 18 total units of PRBCs, Cryoprecipitate, FFP, and platelets.  She was taken emergently to OR for closure of her femoral artery.    Intubated and sedated. Vent O2 at 50%, PEEP 5. Remains on 4 of Norepi.   Creatinine 1.7 from baseline of 1.4 - 1.5. Will watch closely for ATN after profound shock 02/05/17.  Objective:   Weight Range: 277 lb 1.9 oz (125.7 kg) Body mass index is 49.09 kg/m.   Vital Signs:   Temp:  [94.4 F (34.7 C)-98.4 F (36.9 C)] 98.4 F (36.9 C) (09/11 2300) Pulse Rate:  [39-185] 79 (09/12 0730) Resp:  [14-30] 14 (09/12 0802) BP: (70-246)/(36-103) 121/47 (09/12 0802) SpO2:  [98 %-100 %] 100 % (09/12 0730) Arterial Line BP: (41-272)/(22-89) 92/53 (09/12 0730) FiO2 (%):  [40 %-100 %] 40 % (09/12 0803) Weight:  [277 lb 1.9 oz (125.7 kg)] 277 lb 1.9 oz (125.7 kg) (09/12 0433) Last BM Date:  (UTA)  Weight change: Filed Weights   02/05/17 0609 02/06/17 0433  Weight: 246 lb (111.6 kg) 277 lb 1.9 oz (125.7 kg)    Intake/Output:   Intake/Output Summary (Last 24 hours) at 02/06/17 0818 Last data filed at 02/06/17 0800  Gross per 24 hour  Intake          7053.24 ml  Output             4545 ml  Net          2508.24 ml      Physical Exam    General:  Intubated and sedated.  HEENT: Normal Neck: Supple. JVP 6-7 cm. Carotids 2+ bilat; no bruits. No lymphadenopathy or thyromegaly appreciated. Cor: PMI nondisplaced. Regular rate & rhythm. No rubs, gallops or murmurs. Lungs: Clear anteriorly. + Mechanical breathing sounds.  Abdomen: Soft, nontender, nondistended. No hepatosplenomegaly. No bruits or masses. Good bowel sounds. Extremities: No cyanosis, clubbing, or rash.  Trace ankle edema. Warm to the touch. L wound vac in place.  Neuro: Intubated and sedated.   Telemetry   NSR in 80s. Personally reviewed.   EKG    NSR 81 bpm this am. Personally reviewed.  Labs    CBC  Recent Labs  02/05/17 1518 02/05/17 1527 02/06/17 0302  WBC 9.6  --  13.2*  HGB 12.3 11.2* 9.9*  HCT 37.3 33.0* 28.4*  MCV 90.1  --  84.8  PLT 44*  --  60*   Basic Metabolic Panel  Recent Labs  02/05/17 1857 02/06/17 0302  NA 145 144  K 3.9 4.1  CL 116* 116*  CO2 24 24  GLUCOSE 255* 122*  BUN 35* 32*  CREATININE 1.64* 1.74*  CALCIUM 8.9 8.4*  MG  --  1.5*   Liver Function Tests  Recent Labs  02/05/17 1242 02/05/17 1857  AST 27 64*  ALT 11* 36  ALKPHOS 37* 34*  BILITOT 0.6 2.1*  PROT 3.9* 3.9*  ALBUMIN 2.0* 2.3*   No results for input(s): LIPASE, AMYLASE in the last 72 hours. Cardiac Enzymes No results for input(s): CKTOTAL, CKMB, CKMBINDEX, TROPONINI in the last 72 hours.  BNP: BNP (last 3 results) No results for input(s): BNP in the last 8760 hours.  ProBNP (last 3 results) No results for input(s): PROBNP in the  last 8760 hours.   D-Dimer  Recent Labs  02/05/17 1438  DDIMER 7.66*   Hemoglobin A1C No results for input(s): HGBA1C in the last 72 hours. Fasting Lipid Panel No results for input(s): CHOL, HDL, LDLCALC, TRIG, CHOLHDL, LDLDIRECT in the last 72 hours. Thyroid Function Tests No results for input(s): TSH, T4TOTAL, T3FREE, THYROIDAB in the last 72 hours.  Invalid input(s): FREET3  Other results:   Imaging    Dg Abd 1 View  Result Date: 02/05/2017 CLINICAL DATA:  Postop. Evaluate for foreign body. Evacuation of retroperitoneal hematoma. EXAM: ABDOMEN - 1 VIEW COMPARISON:  CT 01/10/2017 FINDINGS: Surgical clip noted lateral to the acetabulum. No additional radiopaque foreign body. Nonobstructive bowel gas pattern. Mild degenerative changes in the hips. No acute bony abnormality. IMPRESSION: No visible unexpected radiopaque  foreign body. Electronically Signed   By: Rolm Baptise M.D.   On: 02/05/2017 15:10   Dg Chest Port 1 View  Result Date: 02/06/2017 CLINICAL DATA:  Intubated patient, status post aortic valve replacement yesterday. EXAM: PORTABLE CHEST 1 VIEW COMPARISON:  Portable chest x-ray of February 05, 2017 FINDINGS: The lungs are well-expanded. The interstitial markings are less conspicuous. The central pulmonary vascularity remains prominent. There is a trace of pleural fluid on the left. The cardiac silhouette is enlarged. The prosthetic aortic valve and aortic stent appear stable. The endotracheal tube tip projects 3.2 cm above the carina. The esophagogastric tube tip projects below the inferior margin of the image. The right internal jugular venous catheter tip projects over the midportion of the SVC. IMPRESSION: Decreased pulmonary interstitial edema and pulmonary vascular congestion. Tiny left pleural effusion. The support tubes are in stable position. Thoracic aortic atherosclerosis. Electronically Signed   By: David  Martinique M.D.   On: 02/06/2017 07:40   Dg Chest Port 1 View  Result Date: 02/05/2017 CLINICAL DATA:  Postop for TAVR. EXAM: PORTABLE CHEST 1 VIEW COMPARISON:  Not 2018, earlier the same day FINDINGS: 1551 hours. Leftward patient rotation. Endotracheal tube tip is 8 mm above the base of the carina and could be retracted 10-15 mm for more appropriate positioning. NG tube appears to be looped upon itself with the tip positioned in the distal esophagus and directed cranially. Left IJ sheath is evident. A right IJ central line tip projects over the expected location of the mid SVC. Cardiopericardial silhouette is enlarged. There is vascular congestion with bibasilar atelectasis. Bones are diffusely demineralized. Telemetry leads overlie the chest. IMPRESSION: 1. Endotracheal tube tip is 8 mm above the base of the carina. 2. NG tube tip is looped upon itself with the tip directed cranially and positioned  in the distal esophagus. 3. Cardiomegaly with vascular congestion and bibasilar atelectasis. I discussed these findings by telephone with the patient's nurse, Altha Harm , at 1610 hours on 02/05/2017. Electronically Signed   By: Misty Stanley M.D.   On: 02/05/2017 16:11   Dg Chest Port 1 View  Result Date: 02/05/2017 CLINICAL DATA:  Status post TAVR. EXAM: PORTABLE CHEST 1 VIEW COMPARISON:  Chest x-ray dated February 01, 2017. FINDINGS: Interval TAVR. Right internal jugular central venous catheter with the tip projecting over the distal SVC. Stable cardiomegaly. Mild pulmonary vascular congestion. Atherosclerotic calcification of the aortic arch. Bibasilar atelectasis. No pneumothorax or pleural effusion. No acute osseous abnormality. IMPRESSION: Interval TAVR. Mild pulmonary vascular congestion with bibasilar atelectasis. Electronically Signed   By: Titus Dubin M.D.   On: 02/05/2017 10:56   Dg Abd Portable 1v  Result Date: 02/05/2017 CLINICAL DATA:  Encounter for orogastric tube placement EXAM: PORTABLE ABDOMEN - 1 VIEW COMPARISON:  02/05/2017 at 1444 hours FINDINGS: Targeted study of the left hemiabdomen from orogastric tube position. The tip and side port of a gastric tube are identified in the left upper quadrant of the abdomen in the expected location of the proximal stomach. A vascular stent projects over the included heart. Blunting the left costophrenic angle is seen. Surgical clips project over the left breast shadow. IMPRESSION: Gastric tube tip and side-port are in the expected location the stomach. Electronically Signed   By: Ashley Royalty M.D.   On: 02/05/2017 20:12      Medications:     Scheduled Medications: . acetaminophen  1,000 mg Oral Q6H   Or  . acetaminophen (TYLENOL) oral liquid 160 mg/5 mL  1,000 mg Per Tube Q6H  . aspirin EC  325 mg Oral Daily   Or  . aspirin  324 mg Per Tube Daily  . furosemide  40 mg Intravenous Once  . insulin aspart  0-24 Units Subcutaneous Q4H    . insulin detemir  28 Units Subcutaneous BID  . insulin regular  0-10 Units Intravenous TID WC  . [START ON 02/07/2017] pantoprazole  40 mg Oral Daily  . sodium chloride flush  3 mL Intravenous Q12H     Infusions: . sodium chloride    . sodium chloride    . albumin human    . cefUROXime (ZINACEF)  IV 1.5 g (02/06/17 0627)  . dexmedetomidine (PRECEDEX) IV infusion 0.3 mcg/kg/hr (02/06/17 0755)  . famotidine (PEPCID) IV Stopped (02/05/17 2146)  . insulin (NOVOLIN-R) infusion 6.1 Units/hr (02/06/17 0731)  . lactated ringers    . lactated ringers    . lactated ringers    . magnesium sulfate    . norepinephrine (LEVOPHED) Adult infusion 4 mcg/min (02/06/17 0755)     PRN Medications:  sodium chloride, albumin human, lactated ringers, metoprolol tartrate, midazolam, morphine injection, ondansetron (ZOFRAN) IV, sodium chloride flush    Patient Profile   Carrie Monroe is a 81 year old with history of AS, CAD, HTN, CKD Stage III, DMII, hyperlipidemia admitted for TAVR.   Post op developed hypotension with hemorrhagic shock.  Assessment/Plan   1. Hemorrhagic shock - Hgb dropped from 10.1 PTA to 6.1 post op.  - Received total 18 units of combination of PRBCs, platelets, cryo, and FFP.  - Required emergent OR with closure of her R femoral artery.  2. Acute respiratory failure - Intubated with shock yesterday.  - Plan ? Extubation today as tolerated. 3. Acute on chronic diastolic CHF - Echo 7/56/43 LVEF 60-65%, Grade 2 DD. Plan for repeat echo today. - With expected post op volume excess - Plan for very gentle diuresis today with lasix 40 mg IV ONCE per Dr. Roxy Manns.  4. Aortic Stenosis s/p TAVR - s/p TAVR with 26 mm [employer]onic Bioprosth valve - ASA and plavix on hold with bleeding - Echo today to assess valve.  5. S/p closure of L femoral artery - Wound vac in place and stable.  - VVS following.   She is stable though tenuous. We are available to provide any assistance as  needed.   Length of Stay: 1  Carrie Monroe  02/06/2017, 8:18 AM  Advanced Heart Failure Team Pager [phone] (M-F; 7a - 4p)  Please contact [facility] for night-coverage after hours (4p -7a ) and weekends on [email or web address]  Patient seen and examined with the above-signed Advanced Practice Provider and/or Housestaff. I  personally reviewed laboratory data, imaging studies and relevant notes. I independently examined the patient and formulated the important aspects of the plan. I have edited the note to reflect any of my changes or salient points. I have personally discussed the plan with the patient and/or family.  She has made an amazing recovery. Awake on vent. SBP stable. Creatinine up only slightly. Hgb 10.1. Needs diuresis followed by vent wean. Post-TAVR echo today.   Carrie Bickers, MD  4:03 PM

## 2017-02-06 NOTE — Progress Notes (Signed)
Progress Note  Patient Name: Carrie Monroe Date of Encounter: 02/06/2017  Primary Cardiologist: Croituro/McAlhany  Subjective   Sedated, intubated  Inpatient Medications    Scheduled Meds: . acetaminophen  1,000 mg Oral Q6H   Or  . acetaminophen (TYLENOL) oral liquid 160 mg/5 mL  1,000 mg Per Tube Q6H  . aspirin EC  325 mg Oral Daily   Or  . aspirin  324 mg Per Tube Daily  . furosemide  40 mg Intravenous Once  . insulin aspart  0-24 Units Subcutaneous Q4H  . insulin detemir  28 Units Subcutaneous BID  . insulin regular  0-10 Units Intravenous TID WC  . [START ON 02/07/2017] pantoprazole  40 mg Oral Daily  . sodium chloride flush  3 mL Intravenous Q12H   Continuous Infusions: . sodium chloride    . sodium chloride    . albumin human    . cefUROXime (ZINACEF)  IV 1.5 g (02/06/17 0627)  . dexmedetomidine (PRECEDEX) IV infusion 0.3 mcg/kg/hr (02/06/17 0755)  . famotidine (PEPCID) IV Stopped (02/05/17 2146)  . insulin (NOVOLIN-R) infusion 6.1 Units/hr (02/06/17 0731)  . lactated ringers    . lactated ringers    . lactated ringers    . magnesium sulfate    . norepinephrine (LEVOPHED) Adult infusion 4 mcg/min (02/06/17 0755)   PRN Meds: sodium chloride, albumin human, lactated ringers, metoprolol tartrate, midazolam, morphine injection, ondansetron (ZOFRAN) IV, sodium chloride flush   Vital Signs    Vitals:   02/06/17 0700 02/06/17 0715 02/06/17 0730 02/06/17 0802  BP:    (!) 121/47  Pulse: 80 78 79   Resp: 20 20 17 14   Temp:      TempSrc:      SpO2: 100% 100% 100%   Weight:      Height:        Intake/Output Summary (Last 24 hours) at 02/06/17 0817 Last data filed at 02/06/17 0800  Gross per 24 hour  Intake          7053.24 ml  Output             4545 ml  Net          2508.24 ml   Filed Weights   02/05/17 0609 02/06/17 0433  Weight: 246 lb (111.6 kg) 277 lb 1.9 oz (125.7 kg)    Telemetry    Sinus- Personally Reviewed  ECG    Sinus, LBBB -  Personally Reviewed  Physical Exam   General: Well developed, well nourished, intubated HEENT: OP clear, mucus membranes moist  SKIN: warm, dry. No rashes. Neuro: No focal deficits  Psychiatric: Mood and affect normal  Neck: No JVD  Lungs:Clear bilaterally, no wheezes, rhonci, crackles Cardiovascular: Regular rate and rhythm. No murmurs, gallops or rubs. Abdomen:Soft. Bowel sounds present. Non-tender.  Extremities: No lower extremity edema. Pulses are 2 + in the bilateral DP/PT.   Labs    Chemistry Recent Labs Lab 02/01/17 1048  02/05/17 1242  02/05/17 1527 02/05/17 1857 02/06/17 0302  NA 139  < > 136  < > 147* 145 144  K 4.0  < > 5.7*  < > 4.1 3.9 4.1  CL 108  < > 113*  --   --  116* 116*  CO2 21*  --  12*  --   --  24 24  GLUCOSE 165*  < > 439*  < > 291* 255* 122*  BUN 38*  < > 35*  --   --  35* 32*  CREATININE 1.41*  < > 1.70*  --   --  1.64* 1.74*  CALCIUM 9.6  --  6.9*  --   --  8.9 8.4*  PROT 6.8  --  3.9*  --   --  3.9*  --   ALBUMIN 3.6  --  2.0*  --   --  2.3*  --   AST 21  --  27  --   --  64*  --   ALT 15  --  11*  --   --  36  --   ALKPHOS 59  --  37*  --   --  34*  --   BILITOT 0.7  --  0.6  --   --  2.1*  --   GFRNONAA 35*  --  28*  --   --  29* 27*  GFRAA 40*  --  32*  --   --  33* 31*  ANIONGAP 10  --  11  --   --  5 4*  < > = values in this interval not displayed.   Hematology Recent Labs Lab 02/05/17 1242  02/05/17 1438 02/05/17 1518 02/05/17 1527 02/06/17 0302  WBC 18.5*  --   --  9.6  --  13.2*  RBC 4.71  --   --  4.14  --  3.35*  HGB 14.2  < >  --  12.3 11.2* 9.9*  HCT 44.2  < >  --  37.3 33.0* 28.4*  MCV 93.8  --   --  90.1  --  84.8  MCH 30.1  --   --  29.7  --  29.6  MCHC 32.1  --   --  33.0  --  34.9  RDW 15.3  --   --  14.0  --  14.5  PLT 82*  --  45* 44*  --  60*  < > = values in this interval not displayed.  DDimer   Recent Labs Lab 02/05/17 1438  DDIMER 7.66*     Radiology    Dg Abd 1 View  Result Date:  02/05/2017 CLINICAL DATA:  Postop. Evaluate for foreign body. Evacuation of retroperitoneal hematoma. EXAM: ABDOMEN - 1 VIEW COMPARISON:  CT 01/10/2017 FINDINGS: Surgical clip noted lateral to the acetabulum. No additional radiopaque foreign body. Nonobstructive bowel gas pattern. Mild degenerative changes in the hips. No acute bony abnormality. IMPRESSION: No visible unexpected radiopaque foreign body. Electronically Signed   By: Rolm Baptise M.D.   On: 02/05/2017 15:10   Dg Chest Port 1 View  Result Date: 02/06/2017 CLINICAL DATA:  Intubated patient, status post aortic valve replacement yesterday. EXAM: PORTABLE CHEST 1 VIEW COMPARISON:  Portable chest x-ray of February 05, 2017 FINDINGS: The lungs are well-expanded. The interstitial markings are less conspicuous. The central pulmonary vascularity remains prominent. There is a trace of pleural fluid on the left. The cardiac silhouette is enlarged. The prosthetic aortic valve and aortic stent appear stable. The endotracheal tube tip projects 3.2 cm above the carina. The esophagogastric tube tip projects below the inferior margin of the image. The right internal jugular venous catheter tip projects over the midportion of the SVC. IMPRESSION: Decreased pulmonary interstitial edema and pulmonary vascular congestion. Tiny left pleural effusion. The support tubes are in stable position. Thoracic aortic atherosclerosis. Electronically Signed   By: David  Martinique M.D.   On: 02/06/2017 07:40   Dg Chest Port 1 View  Result Date: 02/05/2017 CLINICAL DATA:  Postop for TAVR. EXAM: PORTABLE  CHEST 1 VIEW COMPARISON:  Not 2018, earlier the same day FINDINGS: 1551 hours. Leftward patient rotation. Endotracheal tube tip is 8 mm above the base of the carina and could be retracted 10-15 mm for more appropriate positioning. NG tube appears to be looped upon itself with the tip positioned in the distal esophagus and directed cranially. Left IJ sheath is evident. A right IJ  central line tip projects over the expected location of the mid SVC. Cardiopericardial silhouette is enlarged. There is vascular congestion with bibasilar atelectasis. Bones are diffusely demineralized. Telemetry leads overlie the chest. IMPRESSION: 1. Endotracheal tube tip is 8 mm above the base of the carina. 2. NG tube tip is looped upon itself with the tip directed cranially and positioned in the distal esophagus. 3. Cardiomegaly with vascular congestion and bibasilar atelectasis. I discussed these findings by telephone with the patient's nurse, Altha Harm , at 1610 hours on 02/05/2017. Electronically Signed   By: Misty Stanley M.D.   On: 02/05/2017 16:11   Dg Chest Port 1 View  Result Date: 02/05/2017 CLINICAL DATA:  Status post TAVR. EXAM: PORTABLE CHEST 1 VIEW COMPARISON:  Chest x-ray dated February 01, 2017. FINDINGS: Interval TAVR. Right internal jugular central venous catheter with the tip projecting over the distal SVC. Stable cardiomegaly. Mild pulmonary vascular congestion. Atherosclerotic calcification of the aortic arch. Bibasilar atelectasis. No pneumothorax or pleural effusion. No acute osseous abnormality. IMPRESSION: Interval TAVR. Mild pulmonary vascular congestion with bibasilar atelectasis. Electronically Signed   By: Titus Dubin M.D.   On: 02/05/2017 10:56   Dg Abd Portable 1v  Result Date: 02/05/2017 CLINICAL DATA:  Encounter for orogastric tube placement EXAM: PORTABLE ABDOMEN - 1 VIEW COMPARISON:  02/05/2017 at 1444 hours FINDINGS: Targeted study of the left hemiabdomen from orogastric tube position. The tip and side port of a gastric tube are identified in the left upper quadrant of the abdomen in the expected location of the proximal stomach. A vascular stent projects over the included heart. Blunting the left costophrenic angle is seen. Surgical clips project over the left breast shadow. IMPRESSION: Gastric tube tip and side-port are in the expected location the stomach.  Electronically Signed   By: Ashley Royalty M.D.   On: 02/05/2017 20:12    Cardiac Studies     Patient Profile     81 y.o. female with history of CAD, severe aortic stenosis and severe carotid artery disease admitted following TAVR on 02/05/17. Following the case she had an active bleed from her left groin that we were unable to control with manual hemostasis. She had a significant blood loss with shock and was taken to the OR for femoral artery repair.   Assessment & Plan    1. Severe aortic stenosis: She is now one day post TAVR with placement of a 26 mm Medtronic Evolut Pro bioprosthetic valve. ASA and Plavix on hold with bleeding yesterday. Echo today to assess valve. Intubated. Hopefully can extubate today.   2. Left groin hematoma: Post op she had a large hematoma with active bleeding resulting in hypovolemic shock requiring pressors and resuscitation with 18 units of pRBCs, platelets, cryo, FFP and closure of the artery in the OR. Wound vac being following by [facility].   3. Acute on chronic kidney disease: Renal function stable post intervention and bleeding event. Lasix this am. Follow renal function closely.   4. Anemia, acute on chronic: H/H stable this am post transfusion.   For questions or updates, please contact [place] Please consult  [email or web address] for contact info under Cardiology/STEMI.      Signed, Lauree Chandler, MD  02/06/2017, 8:17 AM

## 2017-02-06 NOTE — Progress Notes (Signed)
[facility] BRIEF SICU PROGRESS NOTE  1 Day Post-Op  S/P Procedure(s) (LRB): Evacuation of Retroperitoneal Hematoma and Primary Repair of Femoral Artery and FEMORAL ARTERY EXPLORATION (N/A) APPLICATION OF WOUND VAC (Left)   Extubated uneventfully around noon Looks good and reports feeling well NSR w/ stable BP although still on low dose levophed Breathing comfortably w/ O2 sats 99% on 4 L/min UOP adequate Labs okay although Hgb down slightly 8.7 and creatinine up 2.0  Plan: Continue current plan.  Will transfuse 1 unit PRBC's  Rexene Alberts, MD 02/06/2017 5:30 PM

## 2017-02-06 NOTE — Anesthesia Postprocedure Evaluation (Signed)
Anesthesia Post Note  Patient: Carrie Monroe  Procedure(s) Performed: Procedure(s) (LRB): Evacuation of Retroperitoneal Hematoma and Primary Repair of Femoral Artery and FEMORAL ARTERY EXPLORATION (N/A) APPLICATION OF WOUND VAC (Left)     Patient location during evaluation: SICU Anesthesia Type: General Level of consciousness: sedated Pain management: pain level controlled Vital Signs Assessment: post-procedure vital signs reviewed and stable Respiratory status: patient remains intubated per anesthesia plan Cardiovascular status: stable Postop Assessment: no signs of nausea or vomiting Anesthetic complications: no    Last Vitals:  Vitals:   02/06/17 1245 02/06/17 1300  BP:    Pulse: 88 88  Resp: 11 17  Temp:    SpO2: 99% 97%    Last Pain:  Vitals:   02/06/17 1141  TempSrc: Oral   Pain Goal: Patients Stated Pain Goal: 3 (02/05/17 1700)               Tiajuana Amass

## 2017-02-06 NOTE — Progress Notes (Signed)
PCCM Attending SBT Note:  Patient on PS 0/5 with FiO2 0.4 for over 30 minutes now with a tidal volume of >1000cc with deep inhalation. Normal work of breathing & hemodynamically stable. Family updated at bedside regarding plan. Extubating patient to nasal cannula & weaning FiO2 for Sat >94%. Incentive spirometry with pulmonary toilette post extubation.  Sonia Baller Ashok Cordia, M.D. [facility] Pager:  [phone] After 3pm or if no response, call [phone] 11:50 AM 02/06/17

## 2017-02-06 NOTE — Progress Notes (Signed)
Progress Note    02/06/2017 7:33 AM 1 Day Post-Op  Subjective:  Intubated/sedated  Afebrile HR 70's-90's NSR 56'D-149'F systolic 026% .37CHY8   Gtts: Insulin Dexmedetomidine Norepi   Vitals:   02/06/17 0715 02/06/17 0730  BP:    Pulse: 78 79  Resp: 20 17  Temp:    SpO2: 100% 100%    Physical Exam: Incisions:  Left groin with wound vac in place with good seal; extensive ecchymosis left groin onto panus  Extremities:  Biphasic doppler signals left DP/PT   CBC    Component Value Date/Time   WBC 13.2 (H) 02/06/2017 0302   RBC 3.35 (L) 02/06/2017 0302   HGB 9.9 (L) 02/06/2017 0302   HGB 10.1 (L) 12/27/2016 0831   HGB 11.2 (L) 02/23/2015 1029   HCT 28.4 (L) 02/06/2017 0302   HCT 29.6 (L) 12/27/2016 0831   HCT 33.6 (L) 02/23/2015 1029   PLT 60 (L) 02/06/2017 0302   PLT 145 (L) 12/27/2016 0831   MCV 84.8 02/06/2017 0302   MCV 91 12/27/2016 0831   MCV 95.1 02/23/2015 1029   MCH 29.6 02/06/2017 0302   MCHC 34.9 02/06/2017 0302   RDW 14.5 02/06/2017 0302   RDW 15.4 12/27/2016 0831   RDW 12.9 02/23/2015 1029   LYMPHSABS 1.4 12/27/2016 0831   LYMPHSABS 1.3 02/23/2015 1029   MONOABS 0.3 02/23/2015 1029   EOSABS 0.2 12/27/2016 0831   BASOSABS 0.0 12/27/2016 0831   BASOSABS 0.0 02/23/2015 1029    BMET    Component Value Date/Time   NA 144 02/06/2017 0302   NA 140 01/04/2017 1101   NA 141 02/23/2015 1029   K 4.1 02/06/2017 0302   K 4.3 02/23/2015 1029   CL 116 (H) 02/06/2017 0302   CL 111 (H) 08/28/2012 1224   CO2 24 02/06/2017 0302   CO2 26 02/23/2015 1029   GLUCOSE 122 (H) 02/06/2017 0302   GLUCOSE 193 (H) 02/23/2015 1029   GLUCOSE 103 (H) 08/28/2012 1224   BUN 32 (H) 02/06/2017 0302   BUN 33 (H) 01/04/2017 1101   BUN 20.2 02/23/2015 1029   CREATININE 1.74 (H) 02/06/2017 0302   CREATININE 1.3 (H) 02/23/2015 1029   CALCIUM 8.4 (L) 02/06/2017 0302   CALCIUM 9.5 02/23/2015 1029   GFRNONAA 27 (L) 02/06/2017 0302   GFRAA 31 (L) 02/06/2017 0302     INR    Component Value Date/Time   INR 1.38 02/05/2017 1518     Intake/Output Summary (Last 24 hours) at 02/06/17 0733 Last data filed at 02/06/17 0700  Gross per 24 hour  Intake          7053.24 ml  Output             4530 ml  Net          2523.24 ml     Assessment:  81 y.o. female is s/p:  Procedure:   #1 evacuation of left retroperitoneal hematoma                         #2: Primary repair of left femoral artery ruptured pseudoaneurysm                           #3: Placement of wound VAC  1 Day Post-Op  Plan: -pt with biphasic DP/PT doppler signals -wound vac left groin with good seal; 150cc out -acute blood loss anemia-received 3 units PRBC's-improved this am -thrombocytopenia-platelets up  to 60k (low 44k) -DVT prophylaxis:  SCD's   Leontine Locket, PA-C [facility] [phone] 02/06/2017 7:33 AM

## 2017-02-06 NOTE — Progress Notes (Signed)
Cards fellow paged regarding low UOP. Orders received for 500cc NS bolus if CVP less than 6. CVP 10-12. Will continue to monitor.  Eleonore Chiquito RN 2 Heart

## 2017-02-06 NOTE — Progress Notes (Signed)
CRITICAL VALUE ALERT  Critical Value:  Calcium 8.4  Date & Time Notied:  02/06/17 @ 0830   Provider Notified: Dr. Julianne Handler  Orders Received/Actions taken: Draw an ionized calcium

## 2017-02-07 ENCOUNTER — Inpatient Hospital Stay (HOSPITAL_COMMUNITY): Payer: Medicare Other

## 2017-02-07 LAB — MAGNESIUM: MAGNESIUM: 2.2 mg/dL (ref 1.7–2.4)

## 2017-02-07 LAB — CBC
HCT: 25.7 % — ABNORMAL LOW (ref 36.0–46.0)
Hemoglobin: 8.9 g/dL — ABNORMAL LOW (ref 12.0–15.0)
MCH: 30.2 pg (ref 26.0–34.0)
MCHC: 34.6 g/dL (ref 30.0–36.0)
MCV: 87.1 fL (ref 78.0–100.0)
PLATELETS: 49 10*3/uL — AB (ref 150–400)
RBC: 2.95 MIL/uL — ABNORMAL LOW (ref 3.87–5.11)
RDW: 15.3 % (ref 11.5–15.5)
WBC: 13.3 10*3/uL — AB (ref 4.0–10.5)

## 2017-02-07 LAB — CBC WITH DIFFERENTIAL/PLATELET
BASOS ABS: 0 10*3/uL (ref 0.0–0.1)
BASOS PCT: 0 %
Eosinophils Absolute: 0 10*3/uL (ref 0.0–0.7)
Eosinophils Relative: 0 %
HCT: 25.3 % — ABNORMAL LOW (ref 36.0–46.0)
Hemoglobin: 8.6 g/dL — ABNORMAL LOW (ref 12.0–15.0)
Lymphocytes Relative: 12 %
Lymphs Abs: 1.5 10*3/uL (ref 0.7–4.0)
MCH: 30 pg (ref 26.0–34.0)
MCHC: 34 g/dL (ref 30.0–36.0)
MCV: 88.2 fL (ref 78.0–100.0)
MONO ABS: 1.4 10*3/uL — AB (ref 0.1–1.0)
Monocytes Relative: 11 %
NEUTROS PCT: 77 %
Neutro Abs: 9.6 10*3/uL — ABNORMAL HIGH (ref 1.7–7.7)
Platelets: 45 10*3/uL — ABNORMAL LOW (ref 150–400)
RBC: 2.87 MIL/uL — ABNORMAL LOW (ref 3.87–5.11)
RDW: 15.7 % — AB (ref 11.5–15.5)
WBC: 12.5 10*3/uL — ABNORMAL HIGH (ref 4.0–10.5)

## 2017-02-07 LAB — GLUCOSE, CAPILLARY
GLUCOSE-CAPILLARY: 144 mg/dL — AB (ref 65–99)
Glucose-Capillary: 139 mg/dL — ABNORMAL HIGH (ref 65–99)
Glucose-Capillary: 149 mg/dL — ABNORMAL HIGH (ref 65–99)
Glucose-Capillary: 156 mg/dL — ABNORMAL HIGH (ref 65–99)
Glucose-Capillary: 183 mg/dL — ABNORMAL HIGH (ref 65–99)
Glucose-Capillary: 171 mg/dL — ABNORMAL HIGH (ref 65–99)
Glucose-Capillary: 216 mg/dL — ABNORMAL HIGH (ref 65–99)

## 2017-02-07 LAB — RENAL FUNCTION PANEL
Albumin: 2 g/dL — ABNORMAL LOW (ref 3.5–5.0)
Anion gap: 7 (ref 5–15)
BUN: 42 mg/dL — ABNORMAL HIGH (ref 6–20)
CALCIUM: 8.1 mg/dL — AB (ref 8.9–10.3)
CHLORIDE: 109 mmol/L (ref 101–111)
CO2: 23 mmol/L (ref 22–32)
CREATININE: 2.36 mg/dL — AB (ref 0.44–1.00)
GFR calc Af Amer: 22 mL/min — ABNORMAL LOW (ref >60)
GFR calc non Af Amer: 19 mL/min — ABNORMAL LOW (ref >60)
GLUCOSE: 144 mg/dL — AB (ref 65–99)
Phosphorus: 5.5 mg/dL — ABNORMAL HIGH (ref 2.5–4.6)
Potassium: 4.9 mmol/L (ref 3.5–5.1)
SODIUM: 139 mmol/L (ref 135–145)

## 2017-02-07 LAB — CALCIUM, IONIZED: Calcium, Ionized, Serum: 5.1 mg/dL (ref 4.5–5.6)

## 2017-02-07 MED ORDER — FENTANYL CITRATE (PF) 100 MCG/2ML IJ SOLN
25.0000 ug | INTRAMUSCULAR | Status: DC | PRN
  Administered 2017-02-08 – 2017-02-12 (×4): 50 ug via INTRAVENOUS
  Administered 2017-02-16: 25 ug via INTRAVENOUS
  Filled 2017-02-07 (×5): qty 2

## 2017-02-07 MED ORDER — SODIUM CHLORIDE 0.9% FLUSH
10.0000 mL | INTRAVENOUS | Status: DC | PRN
  Administered 2017-02-11: 20 mL
  Filled 2017-02-07: qty 40

## 2017-02-07 MED ORDER — CHLORHEXIDINE GLUCONATE CLOTH 2 % EX PADS
6.0000 | MEDICATED_PAD | Freq: Every day | CUTANEOUS | Status: DC
  Administered 2017-02-07 – 2017-02-11 (×5): 6 via TOPICAL

## 2017-02-07 MED ORDER — ASPIRIN EC 81 MG PO TBEC
81.0000 mg | DELAYED_RELEASE_TABLET | Freq: Every day | ORAL | Status: DC
  Administered 2017-02-07: 81 mg via ORAL
  Filled 2017-02-07: qty 1

## 2017-02-07 MED ORDER — ROSUVASTATIN CALCIUM 10 MG PO TABS
5.0000 mg | ORAL_TABLET | Freq: Every day | ORAL | Status: DC
  Administered 2017-02-07 – 2017-02-27 (×21): 5 mg via ORAL
  Filled 2017-02-07 (×21): qty 1

## 2017-02-07 MED ORDER — METOPROLOL TARTRATE 25 MG PO TABS
25.0000 mg | ORAL_TABLET | Freq: Two times a day (BID) | ORAL | Status: DC
  Administered 2017-02-07 – 2017-02-27 (×40): 25 mg via ORAL
  Filled 2017-02-07 (×41): qty 1

## 2017-02-07 MED ORDER — DOCUSATE SODIUM 100 MG PO CAPS
100.0000 mg | ORAL_CAPSULE | Freq: Every day | ORAL | Status: DC | PRN
  Administered 2017-02-07 – 2017-02-22 (×4): 100 mg via ORAL
  Filled 2017-02-07 (×4): qty 1

## 2017-02-07 MED ORDER — CLOPIDOGREL BISULFATE 75 MG PO TABS
75.0000 mg | ORAL_TABLET | Freq: Every day | ORAL | Status: DC
  Administered 2017-02-07: 75 mg via ORAL
  Filled 2017-02-07: qty 1

## 2017-02-07 MED ORDER — OXYCODONE-ACETAMINOPHEN 5-325 MG PO TABS
1.0000 | ORAL_TABLET | Freq: Four times a day (QID) | ORAL | Status: DC | PRN
  Administered 2017-02-07 – 2017-02-26 (×22): 1 via ORAL
  Filled 2017-02-07 (×25): qty 1

## 2017-02-07 NOTE — Progress Notes (Signed)
Subjective  - POD #2  Feels ok this am   Physical Exam:  Palpable pedal pules Wound vac in place       Assessment/Plan:  POD #2  Extubated yesterday, off pressors Looks significantly better Plan to change wound vac tomorrow.  May be OOB with VAC  [place], [place] 02/07/2017 7:24 AM --  Vitals:   02/07/17 0500 02/07/17 0600  BP:    Pulse: (!) 107 (!) 104  Resp: 16 15  Temp:    SpO2: 91% 98%    Intake/Output Summary (Last 24 hours) at 02/07/17 0724 Last data filed at 02/07/17 0600  Gross per 24 hour  Intake           1032.9 ml  Output              810 ml  Net            222.9 ml     Laboratory CBC    Component Value Date/Time   WBC 12.5 (H) 02/07/2017 0439   HGB 8.6 (L) 02/07/2017 0439   HGB 10.1 (L) 12/27/2016 0831   HGB 11.2 (L) 02/23/2015 1029   HCT 25.3 (L) 02/07/2017 0439   HCT 29.6 (L) 12/27/2016 0831   HCT 33.6 (L) 02/23/2015 1029   PLT 45 (L) 02/07/2017 0439   PLT 145 (L) 12/27/2016 0831    BMET    Component Value Date/Time   NA 139 02/07/2017 0439   NA 140 01/04/2017 1101   NA 141 02/23/2015 1029   K 4.9 02/07/2017 0439   K 4.3 02/23/2015 1029   CL 109 02/07/2017 0439   CL 111 (H) 08/28/2012 1224   CO2 23 02/07/2017 0439   CO2 26 02/23/2015 1029   GLUCOSE 144 (H) 02/07/2017 0439   GLUCOSE 193 (H) 02/23/2015 1029   GLUCOSE 103 (H) 08/28/2012 1224   BUN 42 (H) 02/07/2017 0439   BUN 33 (H) 01/04/2017 1101   BUN 20.2 02/23/2015 1029   CREATININE 2.36 (H) 02/07/2017 0439   CREATININE 1.3 (H) 02/23/2015 1029   CALCIUM 8.1 (L) 02/07/2017 0439   CALCIUM 9.5 02/23/2015 1029   GFRNONAA 19 (L) 02/07/2017 0439   GFRAA 22 (L) 02/07/2017 0439    COAG Lab Results  Component Value Date   INR 1.38 02/05/2017   INR 1.60 02/05/2017   INR 1.72 02/05/2017   No results found for: PTT  Antibiotics Anti-infectives    Start     Dose/Rate Route Frequency Ordered Stop   02/06/17 1800  cefUROXime (ZINACEF) 1.5 g in dextrose 5 % 50 mL IVPB      1.5 g 100 mL/hr over 30 Minutes Intravenous Every 12 hours 02/06/17 1005 02/07/17 0550   02/06/17 1015  cefUROXime (ZINACEF) 1.5 g in dextrose 5 % 50 mL IVPB  Status:  Discontinued     1.5 g 100 mL/hr over 30 Minutes Intravenous Every 12 hours 02/06/17 1001 02/06/17 1005   02/05/17 2300  vancomycin (VANCOCIN) IVPB 1000 mg/200 mL premix     1,000 mg 200 mL/hr over 60 Minutes Intravenous  Once 02/05/17 1516 02/06/17 0120   02/05/17 2000  cefUROXime (ZINACEF) 1.5 g in dextrose 5 % 50 mL IVPB  Status:  Discontinued     1.5 g 100 mL/hr over 30 Minutes Intravenous Every 12 hours 02/05/17 1032 02/05/17 1516   02/05/17 1900  cefUROXime (ZINACEF) 1.5 g in dextrose 5 % 50 mL IVPB  Status:  Discontinued     1.5 g 100  mL/hr over 30 Minutes Intravenous Every 12 hours 02/05/17 1516 02/06/17 1001   02/05/17 1800  vancomycin (VANCOCIN) IVPB 1000 mg/200 mL premix  Status:  Discontinued     1,000 mg 200 mL/hr over 60 Minutes Intravenous  Once 02/05/17 1032 02/05/17 1516   02/05/17 0400  vancomycin (VANCOCIN) 1,250 mg in sodium chloride 0.9 % 250 mL IVPB  Status:  Discontinued     1,250 mg 166.7 mL/hr over 90 Minutes Intravenous To Surgery 02/04/17 1352 02/05/17 1032   02/05/17 0400  cefUROXime (ZINACEF) 1.5 g in dextrose 5 % 50 mL IVPB     1.5 g 100 mL/hr over 30 Minutes Intravenous To Surgery 02/04/17 1352 02/05/17 0811       V. Leia Alf, M.D. [facility] [place] Office: [phone] Pager:  [phone]

## 2017-02-07 NOTE — Progress Notes (Signed)
[facility] VALVE TEAM   Patient Name: Carrie Monroe Date of Encounter: 02/07/2017  Primary Cardiologist: Dr. [facility] Problem List     Active Problems:   S/P TAVR (transcatheter aortic valve replacement)   Acute respiratory failure with hypoxia (HCC)   Hemorrhagic shock (HCC)     Subjective   Awake and alert. Feeling okay. Having a lot of groin pain. No chest pain or SOB.  Inpatient Medications    Scheduled Meds: . acetaminophen  1,000 mg Oral Q6H   Or  . acetaminophen (TYLENOL) oral liquid 160 mg/5 mL  1,000 mg Per Tube Q6H  . chlorhexidine gluconate (MEDLINE KIT)  15 mL Mouth Rinse BID  . Chlorhexidine Gluconate Cloth  6 each Topical Daily  . furosemide  40 mg Intravenous BID  . insulin aspart  0-24 Units Subcutaneous Q4H  . insulin detemir  28 Units Subcutaneous BID  . insulin regular  0-10 Units Intravenous TID WC  . mouth rinse  15 mL Mouth Rinse QID  . sodium chloride flush  3 mL Intravenous Q12H   Continuous Infusions: . sodium chloride    . sodium chloride    . dexmedetomidine (PRECEDEX) IV infusion Stopped (02/06/17 1000)  . insulin (NOVOLIN-R) infusion Stopped (02/06/17 1400)  . lactated ringers    . norepinephrine (LEVOPHED) Adult infusion Stopped (02/07/17 0442)   PRN Meds: sodium chloride, fentaNYL (SUBLIMAZE) injection, lactated ringers, metoprolol tartrate, ondansetron (ZOFRAN) IV, sodium chloride flush, sodium chloride flush   Vital Signs    Vitals:   02/07/17 0600 02/07/17 0700 02/07/17 0800 02/07/17 0808  BP:      Pulse: (!) 104 (!) 106 100 (!) 102  Resp: 15 (!) _0 Temp:      TempSrc:      SpO2: 98% 97% 98% 98%  Weight:      Height:        Intake/Output Summary (Last 24 hours) at 02/07/17 0836 Last data filed at 02/07/17 0800  Gross per 24 hour  Intake           976.92 ml  Output              795 ml  Net           181.92 ml   Filed Weights   02/05/17 0609  02/06/17 0433 02/07/17 0442  Weight: 246 lb (111.6 kg) 277 lb 1.9 oz (125.7 kg) 285 lb 11.5 oz (129.6 kg)    Physical Exam   GEN: Well nourished, well developed, in no acute distress. Morbidly obese HEENT: Grossly normal.  Neck: Supple, no JVD, carotid bruits, or masses. Cardiac: RRR, 2/6 soft murmur _1 , rubs, or gallops. No clubbing, cyanosis, edema.  Radials/DP/PT 2+ and equal bilaterally.  Respiratory:  Respirations regular and unlabored, clear to auscultation bilaterally. GI: Soft, nontender, nondistended, BS + x 4. MS: no deformity or atrophy. Skin: warm and dry, no rash. Large, tender left groin hematoma with wound vac in place. Also moderate hematoma In right groin Neuro:  Strength and sensation are intact. Psych: AAOx3.  Normal affect.  Labs    CBC  Recent Labs  02/07/17 0227 02/07/17 0439  WBC 13.3* 12.5*  NEUTROABS  --  9.6*  HGB 8.9* 8.6*  HCT 25.7* 25.3*  MCV 87.1 88.2  PLT 49* 45*   Basic Metabolic Panel  Recent Labs  02/06/17 0302 02/06/17 1617 02/06/17 1624 02/07/17 0439  NA 144  --  143 139  K 4.1  --  4.2 4.9  CL 116*  --  109 109  CO2 24  --   --  23  GLUCOSE 122*  --  136* 144*  BUN 32*  --  33* 42*  CREATININE 1.74* 2.08* 2.00* 2.36*  CALCIUM 8.4*  --   --  8.1*  MG 1.5* 2.3  --  2.2  PHOS  --   --   --  5.5*   Liver Function Tests  Recent Labs  02/05/17 1242 02/05/17 1857 02/07/17 0439  AST 27 64*  --   ALT 11* 36  --   ALKPHOS 37* 34*  --   BILITOT 0.6 2.1*  --   PROT 3.9* 3.9*  --   ALBUMIN 2.0* 2.3* 2.0*   No results for input(s): LIPASE, AMYLASE in the last 72 hours. Cardiac Enzymes No results for input(s): CKTOTAL, CKMB, CKMBINDEX, TROPONINI in the last 72 hours. BNP Invalid input(s): POCBNP D-Dimer  Recent Labs  02/05/17 1438  DDIMER 7.66*   Hemoglobin A1C No results for input(s): HGBA1C in the last 72 hours. Fasting Lipid Panel No results for input(s): CHOL, HDL, LDLCALC, TRIG, CHOLHDL, LDLDIRECT in the  last 72 hours. Thyroid Function Tests No results for input(s): TSH, T4TOTAL, T3FREE, THYROIDAB in the last 72 hours.  Invalid input(s): FREET3  Telemetry    NSR, HR 80s - Personally Reviewed  ECG    NSR, LBBB - Personally Reviewed  Radiology    Dg Abd 1 View  Result Date: 02/05/2017 CLINICAL DATA:  Postop. Evaluate for foreign body. Evacuation of retroperitoneal hematoma. EXAM: ABDOMEN - 1 VIEW COMPARISON:  CT 01/10/2017 FINDINGS: Surgical clip noted lateral to the acetabulum. No additional radiopaque foreign body. Nonobstructive bowel gas pattern. Mild degenerative changes in the hips. No acute bony abnormality. IMPRESSION: No visible unexpected radiopaque foreign body. Electronically Signed   By: Rolm Baptise M.D.   On: 02/05/2017 15:10   Dg Chest Port 1 View  Result Date: 02/07/2017 CLINICAL DATA:  Status post transcatheter aortic valve replacement on February 05, 2017 EXAM: PORTABLE CHEST 1 VIEW COMPARISON:  Portable chest x-ray of February 06, 2017 FINDINGS: The lungs are well-expanded. Bibasilar densities persist. There is no pneumothorax or significant pleural effusion. There is subsegmental atelectasis in the left lower lung. The cardiac silhouette is top-normal in size. The pulmonary vascularity is less congested. There is calcification in the wall of the aortic arch. The right internal jugular venous catheter tip projects over the midportion of the SVC. IMPRESSION: Improving CHF. Bibasilar subsegmental atelectasis. No significant pleural effusion. Thoracic aortic athero scleroses. Electronically Signed   By: David  Martinique M.D.   On: 02/07/2017 07:33   Dg Chest Port 1 View  Result Date: 02/06/2017 CLINICAL DATA:  Intubated patient, status post aortic valve replacement yesterday. EXAM: PORTABLE CHEST 1 VIEW COMPARISON:  Portable chest x-ray of February 05, 2017 FINDINGS: The lungs are well-expanded. The interstitial markings are less conspicuous. The central pulmonary vascularity  remains prominent. There is a trace of pleural fluid on the left. The cardiac silhouette is enlarged. The prosthetic aortic valve and aortic stent appear stable. The endotracheal tube tip projects 3.2 cm above the carina. The esophagogastric tube tip projects below the inferior margin of the image. The right internal jugular venous catheter tip projects over the midportion of the SVC. IMPRESSION: Decreased pulmonary interstitial edema and pulmonary vascular congestion. Tiny left pleural effusion. The support tubes are in stable position. Thoracic aortic atherosclerosis. Electronically Signed   By:  David  Martinique M.D.   On: 02/06/2017 07:40   Dg Chest Port 1 View  Result Date: 02/05/2017 CLINICAL DATA:  Postop for TAVR. EXAM: PORTABLE CHEST 1 VIEW COMPARISON:  Not 2018, earlier the same day FINDINGS: 1551 hours. Leftward patient rotation. Endotracheal tube tip is 8 mm above the base of the carina and could be retracted 10-15 mm for more appropriate positioning. NG tube appears to be looped upon itself with the tip positioned in the distal esophagus and directed cranially. Left IJ sheath is evident. A right IJ central line tip projects over the expected location of the mid SVC. Cardiopericardial silhouette is enlarged. There is vascular congestion with bibasilar atelectasis. Bones are diffusely demineralized. Telemetry leads overlie the chest. IMPRESSION: 1. Endotracheal tube tip is 8 mm above the base of the carina. 2. NG tube tip is looped upon itself with the tip directed cranially and positioned in the distal esophagus. 3. Cardiomegaly with vascular congestion and bibasilar atelectasis. I discussed these findings by telephone with the patient's nurse, Altha Harm , at 1610 hours on 02/05/2017. Electronically Signed   By: Misty Stanley M.D.   On: 02/05/2017 16:11   Dg Chest Port 1 View  Result Date: 02/05/2017 CLINICAL DATA:  Status post TAVR. EXAM: PORTABLE CHEST 1 VIEW COMPARISON:  Chest x-ray dated  February 01, 2017. FINDINGS: Interval TAVR. Right internal jugular central venous catheter with the tip projecting over the distal SVC. Stable cardiomegaly. Mild pulmonary vascular congestion. Atherosclerotic calcification of the aortic arch. Bibasilar atelectasis. No pneumothorax or pleural effusion. No acute osseous abnormality. IMPRESSION: Interval TAVR. Mild pulmonary vascular congestion with bibasilar atelectasis. Electronically Signed   By: Titus Dubin M.D.   On: 02/05/2017 10:56   Dg Abd Portable 1v  Result Date: 02/05/2017 CLINICAL DATA:  Encounter for orogastric tube placement EXAM: PORTABLE ABDOMEN - 1 VIEW COMPARISON:  02/05/2017 at 1444 hours FINDINGS: Targeted study of the left hemiabdomen from orogastric tube position. The tip and side port of a gastric tube are identified in the left upper quadrant of the abdomen in the expected location of the proximal stomach. A vascular stent projects over the included heart. Blunting the left costophrenic angle is seen. Surgical clips project over the left breast shadow. IMPRESSION: Gastric tube tip and side-port are in the expected location the stomach. Electronically Signed   By: Ashley Royalty M.D.   On: 02/05/2017 20:12    Cardiac Studies   2D ECHO: 02/06/2017 LV EF: 50% -55% Study Conclusions - Left ventricle: The cavity size was normal. There was mild   concentric hypertrophy. Systolic function was normal. The   estimated ejection fraction was in the range of 50% to 55%. Wall   motion was normal; there were no regional wall motion   abnormalities. Doppler parameters are consistent with abnormal   left ventricular relaxation (grade 1 diastolic dysfunction). - Ventricular septum: Septal motion showed abnormal function and   dyssynergy. - Aortic valve: Medtronic CoreValve Evolut Pro (size 13m) was   functioning normally. Peak velocity (S): 156 cm/s. Mean gradient   (S): 5 mm Hg. Valve area (Vmax): 1.38 cm^2. - Mitral valve: Moderately  calcified annulus. - Pericardium, extracardiac: There was a left pleural effusion.  Patient Profile     81y.o. female with history of chronic diastolic CHF, HTN, HLD, DM, gout, breast cancer, CAD, severe aortic stenosis and severe carotid artery disease who was admitted following TAVR on 02/05/17. Following the case she had an active bleed from her left groin  that we were unable to control with manual hemostasis. She had a significant blood loss with shock and was taken to the OR for femoral artery repair.   Assessment & Plan    Severe AS: s/p successful TAVR with a  26 mm Medtronic CoreValve Evolut Pro via L TF approach on 02/05/17. Post operative echo 02/06/17 showed good valve placement with no PVL. ECG with new LBBB but no bradyarrhythmia. ASA/plavix restarted now that bleed has stabilized. Plan to DC arterial and central line. Up to chair.   Left groin hematoma: Post op she had a large hematoma with active bleeding resulting in hypovolemic shock requiring pressors and resuscitation with 18 units of pRBCs, platelets, cryo, FFP and closure of the artery in the OR. Wound vac being following by VVS. Wound vac to be removed tomorrow.  Acute respiratory failure/shock: now extubated and doing well. Now off pressors   Acute on chronic diastolic CHF: currently on IV Lasix 31m BID. CXR with improving CHF  AKI: creatinine 1.7 -> 2.36. Likely from ATN 2/2 hypovolemic shock   Acute blood loss anemia: H/H down to 7.5 yesterday. S/p one unit pRBCs last night. Hg stable around 8.6 today  Signed, KAngelena Form PA-C  02/07/2017, 8:36 AM    I have seen and examined the patient and agree with the assessment and plan as outlined.  Overall doing well under the circumstances.  Creatinine still trending up but good UOP.  Mobilize.  Restart DAPT today.  Wound care per VVS.   I spent in excess of 15 minutes during the conduct of this hospital encounter and >50% of this time involved direct face-to-face  encounter with the patient for counseling and/or coordination of their care.    CRexene Alberts MD 02/07/2017 1:34 PM

## 2017-02-07 NOTE — Progress Notes (Signed)
Inpatient Diabetes Program Recommendations  AACE/ADA: New Consensus Statement on Inpatient Glycemic Control (2015)  Target Ranges:  Prepandial:   less than 140 mg/dL      Peak postprandial:   less than 180 mg/dL (1-2 hours)      Critically ill patients:  140 - 180 mg/dL   Lab Results  Component Value Date   GLUCAP 144 (H) 02/07/2017   HGBA1C 5.8 (H) 02/01/2017    Review of Glycemic Control  Results for Carrie Monroe, Carrie Monroe (MRN [id number]) as of 02/07/2017 10:58  Ref. Range 02/06/2017 20:33 02/06/2017 23:27 02/06/2017 23:41 02/07/2017 03:44 02/07/2017 08:39  Glucose-Capillary Latest Ref Range: 65 - 99 mg/dL 139 (H) 142 (H) 146 (H) 156 (H) 144 (H)    Diabetes history: Type 2 Outpatient Diabetes medications: none noted Current orders for Inpatient glycemic control: Novolog 0-24 units q4h, Levemir 28 units bid  Inpatient Diabetes Program Recommendations: Agree with current medications for blood sugar management.   Gentry Fitz, RN, BA, MHA, CDE Diabetes Coordinator Inpatient Diabetes Program  [phone] (Team Pager) [phone] ([place]) 02/07/2017 11:06 AM

## 2017-02-07 NOTE — Progress Notes (Signed)
Advanced Heart Failure Rounding Note  Primary Cardiologist: Croituru/McAlhany  Subjective:    Pt underwent elective TAVR procedure 02/05/17. Post op, developed profound hypotension, groin hematoma, and marked anemia. HF team called to assist with profound shock. Intubated. She required pressor support and 18 total units of PRBCs, Cryoprecipitate, FFP, and platelets.  She was taken emergently to OR for closure of her femoral artery.    Tired but otherwise no complaints, just feels malaise. Denies SOB, lightheadedness or dizziness. Pain well controlled from groin wound.   Hgb relatively stable at 8.6  Weight up another 8 lbs. ? Accuracy. ? Up 30-40 lbs pre-op.   Creatinine 1.7 -> 2.36. Likely from ATN after profound shock 02/05/17.  Echo 02/06/17 LVEF 50-55%, Grade 1 DD, Stable AV, Moderately calcified annulus. Left pleural effusion.  Objective:   Weight Range: 285 lb 11.5 oz (129.6 kg) Body mass index is 50.61 kg/m.   Vital Signs:   Temp:  [97.7 F (36.5 C)-99.1 F (37.3 C)] 98.7 F (37.1 C) (09/13 0345) Pulse Rate:  [74-112] 106 (09/13 0700) Resp:  [11-26] 22 (09/13 0700) BP: (94-133)/(40-53) 123/42 (09/13 0400) SpO2:  [91 %-100 %] 97 % (09/13 0700) Arterial Line BP: (88-168)/(33-58) 142/47 (09/13 0700) Weight:  [285 lb 11.5 oz (129.6 kg)] 285 lb 11.5 oz (129.6 kg) (09/13 0442) Last BM Date:  (UTA)  Weight change: Filed Weights   02/05/17 0609 02/06/17 0433 02/07/17 0442  Weight: 246 lb (111.6 kg) 277 lb 1.9 oz (125.7 kg) 285 lb 11.5 oz (129.6 kg)    Intake/Output:   Intake/Output Summary (Last 24 hours) at 02/07/17 0809 Last data filed at 02/07/17 0600  Gross per 24 hour  Intake           976.92 ml  Output              795 ml  Net           181.92 ml      Physical Exam    General: Elderly appearing. Lying in bed. NAD.  HEENT: Normal. O2 via [place] Neck: Supple. JVP does not appear elevated. Carotids 2+ bilat; no bruits. No thyromegaly or nodule noted. Cor:  PMI nondisplaced. RRR, 2/6 AS murmur crisp s2  Lungs: Clear anteriorly. Abdomen: morbidly obese. Soft. + distended with marked ecchymosis and edema over left flank  Extremities: No cyanosis, clubbing, or rash. BLE with trace edema. 2+ edema. Left femoral wound vac + foley in place  Neuro: Alert & orientedx3, cranial nerves grossly intact. moves all 4 extremities w/o difficulty. Affect pleasant    Telemetry   NSR in 80s, Personally reviewed.   EKG    NSR 81 bpm this am. Personally reviewed.  Labs    CBC  Recent Labs  02/07/17 0227 02/07/17 0439  WBC 13.3* 12.5*  NEUTROABS  --  9.6*  HGB 8.9* 8.6*  HCT 25.7* 25.3*  MCV 87.1 88.2  PLT 49* 45*   Basic Metabolic Panel  Recent Labs  02/06/17 0302 02/06/17 1617 02/06/17 1624 02/07/17 0439  NA 144  --  143 139  K 4.1  --  4.2 4.9  CL 116*  --  109 109  CO2 24  --   --  23  GLUCOSE 122*  --  136* 144*  BUN 32*  --  33* 42*  CREATININE 1.74* 2.08* 2.00* 2.36*  CALCIUM 8.4*  --   --  8.1*  MG 1.5* 2.3  --  2.2  PHOS  --   --   --  5.5*   Liver Function Tests  Recent Labs  02/05/17 1242 02/05/17 1857 02/07/17 0439  AST 27 64*  --   ALT 11* 36  --   ALKPHOS 37* 34*  --   BILITOT 0.6 2.1*  --   PROT 3.9* 3.9*  --   ALBUMIN 2.0* 2.3* 2.0*   No results for input(s): LIPASE, AMYLASE in the last 72 hours. Cardiac Enzymes No results for input(s): CKTOTAL, CKMB, CKMBINDEX, TROPONINI in the last 72 hours.  BNP: BNP (last 3 results) No results for input(s): BNP in the last 8760 hours.  ProBNP (last 3 results) No results for input(s): PROBNP in the last 8760 hours.   D-Dimer  Recent Labs  02/05/17 1438  DDIMER 7.66*   Hemoglobin A1C No results for input(s): HGBA1C in the last 72 hours. Fasting Lipid Panel No results for input(s): CHOL, HDL, LDLCALC, TRIG, CHOLHDL, LDLDIRECT in the last 72 hours. Thyroid Function Tests No results for input(s): TSH, T4TOTAL, T3FREE, THYROIDAB in the last 72  hours.  Invalid input(s): FREET3  Other results:   Imaging    Dg Chest Port 1 View  Result Date: 02/07/2017 CLINICAL DATA:  Status post transcatheter aortic valve replacement on February 05, 2017 EXAM: PORTABLE CHEST 1 VIEW COMPARISON:  Portable chest x-ray of February 06, 2017 FINDINGS: The lungs are well-expanded. Bibasilar densities persist. There is no pneumothorax or significant pleural effusion. There is subsegmental atelectasis in the left lower lung. The cardiac silhouette is top-normal in size. The pulmonary vascularity is less congested. There is calcification in the wall of the aortic arch. The right internal jugular venous catheter tip projects over the midportion of the SVC. IMPRESSION: Improving CHF. Bibasilar subsegmental atelectasis. No significant pleural effusion. Thoracic aortic athero scleroses. Electronically Signed   By: David  Martinique M.D.   On: 02/07/2017 07:33     Medications:     Scheduled Medications: . acetaminophen  1,000 mg Oral Q6H   Or  . acetaminophen (TYLENOL) oral liquid 160 mg/5 mL  1,000 mg Per Tube Q6H  . chlorhexidine gluconate (MEDLINE KIT)  15 mL Mouth Rinse BID  . Chlorhexidine Gluconate Cloth  6 each Topical Daily  . furosemide  40 mg Intravenous BID  . insulin aspart  0-24 Units Subcutaneous Q4H  . insulin detemir  28 Units Subcutaneous BID  . insulin regular  0-10 Units Intravenous TID WC  . mouth rinse  15 mL Mouth Rinse QID  . pantoprazole (PROTONIX) IV  40 mg Intravenous Q24H  . sodium chloride flush  3 mL Intravenous Q12H    Infusions: . sodium chloride    . sodium chloride    . dexmedetomidine (PRECEDEX) IV infusion Stopped (02/06/17 1000)  . insulin (NOVOLIN-R) infusion Stopped (02/06/17 1400)  . lactated ringers    . norepinephrine (LEVOPHED) Adult infusion Stopped (02/07/17 0442)    PRN Medications: sodium chloride, fentaNYL (SUBLIMAZE) injection, lactated ringers, metoprolol tartrate, midazolam, ondansetron (ZOFRAN)  IV, sodium chloride flush, sodium chloride flush    Patient Profile   Carrie Monroe is a 81 year old with history of AS, CAD, HTN, CKD Stage III, DMII, hyperlipidemia admitted for TAVR.   Post op developed hypotension with hemorrhagic shock.  Assessment/Plan   1. Hemorrhagic shock - Hgb dropped from 10.1 PTA to 6.1 post op.  - Received total 18 units of combination of PRBCs, platelets, cryo, and FFP.  - Required emergent OR with closure of her R femoral artery.  - Hgb relatively stable at 8.6. 2. Acute  respiratory failure - Intubated with shock 02/05/17. Extubated 02/06/17 after mild diuresis - Stable on O2 via [place] currently.  3. Acute on chronic diastolic CHF - Echo 3/56/86 LVEF 60-65%, Grade 2 DD -> Echo 02/06/17 LVEF 50-55% s/p TAVR.  - Volume status up after significant amount of blood transfusion.  - Continue low dose IV lasix for now.   - Pressures in 110-120s  4. Aortic Stenosis s/p TAVR - s/p TAVR with 26 mm Medtronic Bioprosth valve - ASA and plavix on hold with bleeding - Echo 02/06/17 with stable AVR.  5. S/p closure of L femoral artery - Wound vac in place and stable. To be changed today.  - [facility]VS following.   Length of Stay: 2  Shirley Friar, PA-C  02/07/2017, 8:09 AM  Advanced Heart Failure Team Pager [phone] (M-F; 7a - 4p)  Please contact [facility] for night-coverage after hours (4p -7a ) and weekends on [email or web address]  Patient seen and examined with the above-signed Advanced Practice Provider and/or Housestaff. I personally reviewed laboratory data, imaging studies and relevant notes. I independently examined the patient and formulated the important aspects of the plan. I have edited the note to reflect any of my changes or salient points. I have personally discussed the plan with the patient and/or family.  She remains massively volume overloaded. Weight up about 40 pounds after resuscitation. However CVP low so likely 3rd spacing. Will place  compression stocking and continue with slow diuresis. Hgb down and received another unit of RBCs. Wound site ok. Echo shows stable TAVR. Respiratory status much improved can likely go to SDU later today or in am. D/W Dr. Angelena Form.   Would leave Foley in 1-2 more days if possible until volume status a bit better.   Glori Bickers, MD  5:11 PM

## 2017-02-07 NOTE — Progress Notes (Signed)
Arterial line removed per MD order. Manual pressure was held x 20 minutes. Right radial level zero. VSS. Will continue to monitor closely.  Lucius Conn, RN

## 2017-02-07 NOTE — Progress Notes (Signed)
Pt right central line removed per MD order. Manual pressure held for 15 minutes. Site level zero. VSS.  Lucius Conn, RN

## 2017-02-07 NOTE — Progress Notes (Signed)
Patient ID: Carrie Monroe, female   DOB: [birth year], 81 y.o.   MRN: [id number]  SICU Evening Rounds  Hemodynamically stable in sinus rhythm Says she feels better, smiling No shortness of breath Some groin pain Taking po well and urine output good.

## 2017-02-07 NOTE — Progress Notes (Signed)
PULMONARY / CRITICAL CARE MEDICINE   Name: Carrie Monroe MRN: [id number] DOB: Jul 02, 1935    ADMISSION DATE:  02/05/2017   CONSULTATION DATE: 02/05/2017  REFERRING MD:  Dr. Angelena Form  CHIEF COMPLAINT:  Shock, resp failure  HISTORY OF PRESENT ILLNESS:  Patient is encephalopathic and/or intubated. Therefore history has been obtained from chart review. 81 y.o. female with PMH as below, which is significant for severe AS, CKD III, CAD, DM, and breast Ca s/p lumpectomy and radiation.  She recently underwent LHC with successful stenting of LAD and RCA 8/3. She presented to [facility] 9/11 for elective TAVR. The procedure itself was without complication, however, there was some difficulty achieving hemostasis at groin site and angioseal was used. Hgb drop to 6.1 noted post op. Blood transfusion was given and she was sent to ICU for recovery. Soon after arrival to ICU L femoral hematoma expanded and patient became shocky requiring intubation and return trip to OR for exploration and repair of L femoral site. She underwent evacuation of RP hematoma and repair of left femoral artery ruptures pseudoaneurysm. She required large amount of blood products for perioperative resuscitation including at least 18 units PRBC, 10 units FFP, and 3 units platelets. Post-operatively she was sent to ICU for recovery. PCCM asked to see.   SUBJECTIVE: Patient extubated yesterday. Denies any chest pain or pressure. Reports dyspnea is improving. Minimal cough.  REVIEW OF SYSTEMS:  Minimal nausea. No frank abdominal pain. No subjective fever or chills. No headache.  VITAL SIGNS: BP (!) 123/42 (BP Location: Left Arm)   Pulse (!) 106   Temp 98.7 F (37.1 C) (Oral)   Resp (!) 22   Ht 5\' 3"  (1.6 m)   Wt 285 lb 11.5 oz (129.6 kg)   SpO2 97%   BMI 50.61 kg/m   HEMODYNAMICS: CVP:  [2 mmHg-17 mmHg] 8 mmHg  VENTILATOR SETTINGS:    INTAKE / OUTPUT: I/O last 3 completed shifts: In: 2536.5 [I.V.:2177.5; Blood:119;  NG/GT:90; IV Piggyback:150] Out: 6010 [Urine:1055; Emesis/NG output:300; Drains:210]  PHYSICAL EXAMINATION: General: Awake. No family at bedside. No acute distress. Integument:  Wound VAC present over left groin. 1. Dry. Extremities:  No cyanosis or clubbing.  HEENT:  Was mucous membranes. No scleral icterus. Pupils symmetric Cardiovascular: Slightly tachycardic. Unable to appreciate JVD with body habitus. Pulmonary:  Slightly diminished breath sounds bilateral lung bases. Able to draw greater than 1500 mL on incentive spirometer. Normal work of breathing on nasal cannula. Abdomen: Protuberant. Soft. Grossly nontender. Neurological:  Oriented 3. No meningismus. Grossly nonfocal.  LABS:  BMET  Recent Labs Lab 02/05/17 1857 02/06/17 0302 02/06/17 1617 02/06/17 1624 02/07/17 0439  NA 145 144  --  143 139  K 3.9 4.1  --  4.2 4.9  CL 116* 116*  --  109 109  CO2 24 24  --   --  23  BUN 35* 32*  --  33* 42*  CREATININE 1.64* 1.74* 2.08* 2.00* 2.36*  GLUCOSE 255* 122*  --  136* 144*    Electrolytes  Recent Labs Lab 02/05/17 1857 02/06/17 0302 02/06/17 1617 02/07/17 0439  CALCIUM 8.9 8.4*  --  8.1*  MG  --  1.5* 2.3 2.2  PHOS  --   --   --  5.5*    CBC  Recent Labs Lab 02/06/17 1617 02/06/17 1624 02/07/17 0227 02/07/17 0439  WBC 17.1*  --  13.3* 12.5*  HGB 8.7* 7.5* 8.9* 8.6*  HCT 25.3* 22.0* 25.7* 25.3*  PLT 55*  --  49* 45*    Coag's  Recent Labs Lab 02/05/17 1046 02/05/17 1242 02/05/17 1438 02/05/17 1518  APTT 27  --  50* 53*  INR 1.43 1.72 1.60 1.38    Sepsis Markers No results for input(s): LATICACIDVEN, PROCALCITON, O2SATVEN in the last 168 hours.  ABG  Recent Labs Lab 02/05/17 1910 02/06/17 0314 02/06/17 0952  PHART 7.408 7.473* 7.425  PCO2ART 39.8 35.3 37.7  PO2ART 175.0* 176.0* 121.0*    Liver Enzymes  Recent Labs Lab 02/01/17 1048 02/05/17 1242 02/05/17 1857 02/07/17 0439  AST 21 27 64*  --   ALT 15 11* 36  --    ALKPHOS 59 37* 34*  --   BILITOT 0.7 0.6 2.1*  --   ALBUMIN 3.6 2.0* 2.3* 2.0*    Cardiac Enzymes No results for input(s): TROPONINI, PROBNP in the last 168 hours.  Glucose  Recent Labs Lab 02/06/17 1303 02/06/17 1407 02/06/17 2005 02/06/17 2327 02/06/17 2341 02/07/17 0344  GLUCAP 96 87 162* 142* 146* 156*    Imaging Dg Chest Port 1 View  Result Date: 02/07/2017 CLINICAL DATA:  Status post transcatheter aortic valve replacement on February 05, 2017 EXAM: PORTABLE CHEST 1 VIEW COMPARISON:  Portable chest x-ray of February 06, 2017 FINDINGS: The lungs are well-expanded. Bibasilar densities persist. There is no pneumothorax or significant pleural effusion. There is subsegmental atelectasis in the left lower lung. The cardiac silhouette is top-normal in size. The pulmonary vascularity is less congested. There is calcification in the wall of the aortic arch. The right internal jugular venous catheter tip projects over the midportion of the SVC. IMPRESSION: Improving CHF. Bibasilar subsegmental atelectasis. No significant pleural effusion. Thoracic aortic athero scleroses. Electronically Signed   By: David  Martinique M.D.   On: 02/07/2017 07:33     STUDIES:  TTE 09/04/16:  Moderate LVH with EF 60-65% and normal regional wall motion. Grade 2 diastolic dysfunction. LA moderately dilated & RA upper limits of normal in size. Severe aortic stenosis. Aortic root normal in size. Trivial mitral regurgitation. Mild pulmonic regurgitation. No significant tricuspid regurgitation. No pericardial effusion. PFT 12/20/16: FVC 1.87 L (75%) FEV1 1.59 L (86%) FEV1/FVC 0.85 FEF 25-75 1.86 L (134%) negative bronchodilator response TLC 4.10 L (84%) RV 92% DLCO corrected 53% PORT CXR 9/12:  Previously reviewed by me. Questionable right hilar opacity and possible consolidation with air bronchograms. Continued silhouetting left hemidiaphragm and left lower lung opacification that could represent pleural effusion  versus atelectasis versus consolidation. Endotracheal tube in good position. Right and left internal jugular central venous catheter is in good position. Enteric feeding tube coursing below diaphragm. PORT CXR 9/13:  Personally reviewed by me. Bilateral iinternal jugular central venous catheters in place. Endotracheal tube and enteric feeding tube removed. Persistent silhouetting of left hemidiaphragm with right hilar fullness. No new focal opacity.  MICROBIOLOGY: MRSA PCR 9/7:  Negative   ANTIBIOTICS: Cefuroxime 9/11 - 9/13 (PERIOP) Vancomycin 9/12 (PERIOP)  SIGNIFICANT EVENTS: 7/90 - TAVR complicated by massive blood loss RP hematoma, repaired in OR.   LINES/TUBES:  OETT 9/11 - 9/12 OGT 9/11 - 9/12 R IJ DL CVL 9/11 >>> L IJ CORDIS 9/11 >>> R RAD ART LINE 9/11 >>> FOLEY 9/11 >>> PIV  ASSESSMENT / PLAN:  81 y.o. female status post transaortic valve replacement with postprocedural complication of massive bleed requiring significant resuscitation efforts with blood products. Patient subsequently reintubated and taken back to the operating room.Stable postextubation yesterday with continually improving respiratory status.  1. Acute hypoxic respiratory failure:  Steadily improving. Patient counseled on appropriate technique with incentive spirometer. 2. Shock: Resolved. Likely multifactorial but predominantly hemorrhagic. 3. Acute on chronic renal failure: Unclear renal failure stage. Continuing to drain urine output with Foley catheter. Mild worsening today. Recommend continuing to monitor electrolytes and renal function daily. 4. Anemia & thrombocytopenia: Likely secondary to consumption in the setting of acute blood loss. Seems to be stable.no signs of active bleeding. Recommend continuing to trend cell counts daily. Transfusion goals as per primary service and other consultants.  Prophylaxis:  SCDs. Discontinuing Protonix. Diet:  Heart healthy diet Code Status:  Full Code per previous  physician discussions. Disposition:  As per primary service. Family Update:  Patient updated at the time of my exam.  I have spent a total of 36 minutes of time today caring for the patient, reviewing the patient's electronic medical record, and with more than 50% of that time spent coordinating care with the patient as well as reviewing the continuing plan of care with the patient at bedside.  Remainder of care as per primary service and other consultants. PCCM will sign off. Please let me know if there are other questions or concerns.  Sonia Baller Ashok Cordia, M.D. [facility] Pager:  [phone] After 3pm or if no response, call [phone] 02/07/2017 8:12 AM

## 2017-02-07 NOTE — Progress Notes (Signed)
Progress Note  Patient Name: Carrie Monroe Date of Encounter: 02/07/2017  Primary Cardiologist: Croituro/Alvis Edgell  Subjective   No chest pain or dyspnea  Inpatient Medications    Scheduled Meds: . acetaminophen  1,000 mg Oral Q6H   Or  . acetaminophen (TYLENOL) oral liquid 160 mg/5 mL  1,000 mg Per Tube Q6H  . chlorhexidine gluconate (MEDLINE KIT)  15 mL Mouth Rinse BID  . Chlorhexidine Gluconate Cloth  6 each Topical Daily  . furosemide  40 mg Intravenous BID  . insulin aspart  0-24 Units Subcutaneous Q4H  . insulin detemir  28 Units Subcutaneous BID  . insulin regular  0-10 Units Intravenous TID WC  . mouth rinse  15 mL Mouth Rinse QID  . pantoprazole (PROTONIX) IV  40 mg Intravenous Q24H  . sodium chloride flush  3 mL Intravenous Q12H   Continuous Infusions: . sodium chloride    . sodium chloride    . dexmedetomidine (PRECEDEX) IV infusion Stopped (02/06/17 1000)  . insulin (NOVOLIN-R) infusion Stopped (02/06/17 1400)  . lactated ringers    . norepinephrine (LEVOPHED) Adult infusion Stopped (02/07/17 0442)   PRN Meds: sodium chloride, fentaNYL (SUBLIMAZE) injection, lactated ringers, metoprolol tartrate, midazolam, ondansetron (ZOFRAN) IV, sodium chloride flush, sodium chloride flush   Vital Signs    Vitals:   02/07/17 0442 02/07/17 0500 02/07/17 0600 02/07/17 0700  BP:      Pulse:  (!) 107 (!) 104 (!) 106  Resp:  16 15 (!) 22  Temp:      TempSrc:      SpO2:  91% 98% 97%  Weight: 285 lb 11.5 oz (129.6 kg)     Height:        Intake/Output Summary (Last 24 hours) at 02/07/17 0802 Last data filed at 02/07/17 0600  Gross per 24 hour  Intake           976.92 ml  Output              795 ml  Net           181.92 ml   Filed Weights   02/05/17 0609 02/06/17 0433 02/07/17 0442  Weight: 246 lb (111.6 kg) 277 lb 1.9 oz (125.7 kg) 285 lb 11.5 oz (129.6 kg)    Telemetry    Sinus tach- Personally Reviewed  ECG    Personally Reviewed  Physical Exam     General: Well developed, well nourished, NAD  HEENT: OP clear, mucus membranes moist  SKIN: warm, dry. No rashes. Neuro: No focal deficits  Musculoskeletal: Muscle strength 5/5 all ext  Psychiatric: Mood and affect normal  Neck: No JVD, no carotid bruits, no thyromegaly, no lymphadenopathy.  Lungs:Clear bilaterally, no wheezes, rhonci, crackles Cardiovascular: Regular and tachy. Soft systolic murmur.  Abdomen:Soft. Bowel sounds present. Non-tender.  Extremities: 1+ bilateral lower extremity edema.    Labs    Chemistry Recent Labs Lab 02/01/17 1048  02/05/17 1242  02/05/17 1857 02/06/17 0302 02/06/17 1617 02/06/17 1624 02/07/17 0439  NA 139  < > 136  < > 145 144  --  143 139  K 4.0  < > 5.7*  < > 3.9 4.1  --  4.2 4.9  CL 108  < > 113*  --  116* 116*  --  109 109  CO2 21*  --  12*  --  24 24  --   --  23  GLUCOSE 165*  < > 439*  < > 255* 122*  --  136* 144*  BUN 38*  < > 35*  --  35* 32*  --  33* 42*  CREATININE 1.41*  < > 1.70*  --  1.64* 1.74* 2.08* 2.00* 2.36*  CALCIUM 9.6  --  6.9*  --  8.9 8.4*  --   --  8.1*  PROT 6.8  --  3.9*  --  3.9*  --   --   --   --   ALBUMIN 3.6  --  2.0*  --  2.3*  --   --   --  2.0*  AST 21  --  27  --  64*  --   --   --   --   ALT 15  --  11*  --  36  --   --   --   --   ALKPHOS 59  --  37*  --  34*  --   --   --   --   BILITOT 0.7  --  0.6  --  2.1*  --   --   --   --   GFRNONAA 35*  --  28*  --  29* 27* 22*  --  19*  GFRAA 40*  --  32*  --  33* 31* 25*  --  22*  ANIONGAP 10  --  11  --  5 4*  --   --  7  < > = values in this interval not displayed.   Hematology  Recent Labs Lab 02/06/17 1617 02/06/17 1624 02/07/17 0227 02/07/17 0439  WBC 17.1*  --  13.3* 12.5*  RBC 2.96*  --  2.95* 2.87*  HGB 8.7* 7.5* 8.9* 8.6*  HCT 25.3* 22.0* 25.7* 25.3*  MCV 85.5  --  87.1 88.2  MCH 29.4  --  30.2 30.0  MCHC 34.4  --  34.6 34.0  RDW 15.4  --  15.3 15.7*  PLT 55*  --  49* 45*    DDimer   Recent Labs Lab 02/05/17 1438  DDIMER  7.66*     Radiology    Dg Abd 1 View  Result Date: 02/05/2017 CLINICAL DATA:  Postop. Evaluate for foreign body. Evacuation of retroperitoneal hematoma. EXAM: ABDOMEN - 1 VIEW COMPARISON:  CT 01/10/2017 FINDINGS: Surgical clip noted lateral to the acetabulum. No additional radiopaque foreign body. Nonobstructive bowel gas pattern. Mild degenerative changes in the hips. No acute bony abnormality. IMPRESSION: No visible unexpected radiopaque foreign body. Electronically Signed   By: Rolm Baptise M.D.   On: 02/05/2017 15:10   Dg Chest Port 1 View  Result Date: 02/07/2017 CLINICAL DATA:  Status post transcatheter aortic valve replacement on February 05, 2017 EXAM: PORTABLE CHEST 1 VIEW COMPARISON:  Portable chest x-ray of February 06, 2017 FINDINGS: The lungs are well-expanded. Bibasilar densities persist. There is no pneumothorax or significant pleural effusion. There is subsegmental atelectasis in the left lower lung. The cardiac silhouette is top-normal in size. The pulmonary vascularity is less congested. There is calcification in the wall of the aortic arch. The right internal jugular venous catheter tip projects over the midportion of the SVC. IMPRESSION: Improving CHF. Bibasilar subsegmental atelectasis. No significant pleural effusion. Thoracic aortic athero scleroses. Electronically Signed   By: David  Martinique M.D.   On: 02/07/2017 07:33   Dg Chest Port 1 View  Result Date: 02/06/2017 CLINICAL DATA:  Intubated patient, status post aortic valve replacement yesterday. EXAM: PORTABLE CHEST 1 VIEW COMPARISON:  Portable chest x-ray of February 05, 2017 FINDINGS:  The lungs are well-expanded. The interstitial markings are less conspicuous. The central pulmonary vascularity remains prominent. There is a trace of pleural fluid on the left. The cardiac silhouette is enlarged. The prosthetic aortic valve and aortic stent appear stable. The endotracheal tube tip projects 3.2 cm above the carina. The  esophagogastric tube tip projects below the inferior margin of the image. The right internal jugular venous catheter tip projects over the midportion of the SVC. IMPRESSION: Decreased pulmonary interstitial edema and pulmonary vascular congestion. Tiny left pleural effusion. The support tubes are in stable position. Thoracic aortic atherosclerosis. Electronically Signed   By: David  Martinique M.D.   On: 02/06/2017 07:40   Dg Chest Port 1 View  Result Date: 02/05/2017 CLINICAL DATA:  Postop for TAVR. EXAM: PORTABLE CHEST 1 VIEW COMPARISON:  Not 2018, earlier the same day FINDINGS: 1551 hours. Leftward patient rotation. Endotracheal tube tip is 8 mm above the base of the carina and could be retracted 10-15 mm for more appropriate positioning. NG tube appears to be looped upon itself with the tip positioned in the distal esophagus and directed cranially. Left IJ sheath is evident. A right IJ central line tip projects over the expected location of the mid SVC. Cardiopericardial silhouette is enlarged. There is vascular congestion with bibasilar atelectasis. Bones are diffusely demineralized. Telemetry leads overlie the chest. IMPRESSION: 1. Endotracheal tube tip is 8 mm above the base of the carina. 2. NG tube tip is looped upon itself with the tip directed cranially and positioned in the distal esophagus. 3. Cardiomegaly with vascular congestion and bibasilar atelectasis. I discussed these findings by telephone with the patient's nurse, Altha Harm , at 1610 hours on 02/05/2017. Electronically Signed   By: Misty Stanley M.D.   On: 02/05/2017 16:11   Dg Chest Port 1 View  Result Date: 02/05/2017 CLINICAL DATA:  Status post TAVR. EXAM: PORTABLE CHEST 1 VIEW COMPARISON:  Chest x-ray dated February 01, 2017. FINDINGS: Interval TAVR. Right internal jugular central venous catheter with the tip projecting over the distal SVC. Stable cardiomegaly. Mild pulmonary vascular congestion. Atherosclerotic calcification of the  aortic arch. Bibasilar atelectasis. No pneumothorax or pleural effusion. No acute osseous abnormality. IMPRESSION: Interval TAVR. Mild pulmonary vascular congestion with bibasilar atelectasis. Electronically Signed   By: Titus Dubin M.D.   On: 02/05/2017 10:56   Dg Abd Portable 1v  Result Date: 02/05/2017 CLINICAL DATA:  Encounter for orogastric tube placement EXAM: PORTABLE ABDOMEN - 1 VIEW COMPARISON:  02/05/2017 at 1444 hours FINDINGS: Targeted study of the left hemiabdomen from orogastric tube position. The tip and side port of a gastric tube are identified in the left upper quadrant of the abdomen in the expected location of the proximal stomach. A vascular stent projects over the included heart. Blunting the left costophrenic angle is seen. Surgical clips project over the left breast shadow. IMPRESSION: Gastric tube tip and side-port are in the expected location the stomach. Electronically Signed   By: Ashley Royalty M.D.   On: 02/05/2017 20:12    Cardiac Studies   Echo 02/06/17: - Left ventricle: The cavity size was normal. There was mild   concentric hypertrophy. Systolic function was normal. The   estimated ejection fraction was in the range of 50% to 55%. Wall   motion was normal; there were no regional wall motion   abnormalities. Doppler parameters are consistent with abnormal   left ventricular relaxation (grade 1 diastolic dysfunction). - Ventricular septum: Septal motion showed abnormal function and   dyssynergy. -  Aortic valve: Medtronic CoreValve Evolut Pro (size 36m) was   functioning normally. Peak velocity (S): 156 cm/s. Mean gradient   (S): 5 mm Hg. Valve area (Vmax): 1.38 cm^2. - Mitral valve: Moderately calcified annulus. - Pericardium, extracardiac: There was a left pleural effusion.  Patient Profile     81y.o. female with history of CAD, severe aortic stenosis and severe carotid artery disease admitted following TAVR on 02/05/17. Following the case she had an  active bleed from her left groin that we were unable to control with manual hemostasis. She had a significant blood loss with shock and was taken to the OR for femoral artery repair.   Assessment & Plan    1. Severe aortic stenosis: She is two days post TAVR with placement of 26 mm Medtronic Evolut Pro bioprosthetic valve. Echo 02/06/17 shows normal valve function.  Will restart ASA an Plavix today.   2. Left groin hematoma: Large hematoma left groin post procedure with active bleeding, unable to control with manual hemostasis and requiring surgical exploration with direct vessel closure. H/H down to 7.5 yesterday. S/p one unit pRBCs last night. Wound vac to be removed by [facility] tomorrow.    3. Acute on chronic kidney disease: Renal function worse than on admission but not far from baseline.  Will continue IV Lasix today with volume overload. CVP was 3 but will try to use Lasix to mobilize fluid (40lb weight gain over last 2 days after volume replacement)  4. Anemia, acute on chronic due to blood loss post procedure: H/h stable this am.   5. CAD: No chest pain. Restart beta blocker, statin, ASA and Plavix.   D/c arterial line. D/C central line later today. Up to chair. Daily sacral wound checks.   For questions or updates, please contact CBoylePlease consult [email or web address] for contact info under Cardiology/STEMI.      Signed, CLauree Chandler MD  02/07/2017, 8:02 AM

## 2017-02-08 ENCOUNTER — Inpatient Hospital Stay (HOSPITAL_COMMUNITY): Payer: Medicare Other | Admitting: Certified Registered Nurse Anesthetist

## 2017-02-08 ENCOUNTER — Encounter (HOSPITAL_COMMUNITY)
Admission: RE | Disposition: A | Discharge status: Skilled Nursing Facility | Payer: Self-pay | Source: Ambulatory Visit | Attending: Cardiovascular Disease

## 2017-02-08 ENCOUNTER — Encounter (HOSPITAL_COMMUNITY): Payer: Self-pay | Admitting: *Deleted

## 2017-02-08 DIAGNOSIS — I350 Nonrheumatic aortic (valve) stenosis: Secondary | ICD-10-CM

## 2017-02-08 DIAGNOSIS — N179 Acute kidney failure, unspecified: Secondary | ICD-10-CM

## 2017-02-08 DIAGNOSIS — I97638 Postprocedural hematoma of a circulatory system organ or structure following other circulatory system procedure: Secondary | ICD-10-CM

## 2017-02-08 DIAGNOSIS — Z954 Presence of other heart-valve replacement: Secondary | ICD-10-CM

## 2017-02-08 HISTORY — PX: HEMATOMA EVACUATION: SHX5118

## 2017-02-08 LAB — RENAL FUNCTION PANEL
ALBUMIN: 1.9 g/dL — AB (ref 3.5–5.0)
ANION GAP: 3 — AB (ref 5–15)
BUN: 47 mg/dL — ABNORMAL HIGH (ref 6–20)
CALCIUM: 7.9 mg/dL — AB (ref 8.9–10.3)
CO2: 26 mmol/L (ref 22–32)
Chloride: 107 mmol/L (ref 101–111)
Creatinine, Ser: 2.48 mg/dL — ABNORMAL HIGH (ref 0.44–1.00)
GFR calc non Af Amer: 18 mL/min — ABNORMAL LOW (ref >60)
GFR, EST AFRICAN AMERICAN: 20 mL/min — AB (ref >60)
Glucose, Bld: 170 mg/dL — ABNORMAL HIGH (ref 65–99)
PHOSPHORUS: 4.1 mg/dL (ref 2.5–4.6)
POTASSIUM: 3.4 mmol/L — AB (ref 3.5–5.1)
SODIUM: 136 mmol/L (ref 135–145)

## 2017-02-08 LAB — CBC WITH DIFFERENTIAL/PLATELET
BASOS ABS: 0 10*3/uL (ref 0.0–0.1)
BASOS PCT: 0 %
Eosinophils Absolute: 0.1 10*3/uL (ref 0.0–0.7)
Eosinophils Relative: 1 %
HEMATOCRIT: 19.3 % — AB (ref 36.0–46.0)
HEMOGLOBIN: 6.5 g/dL — AB (ref 12.0–15.0)
Lymphocytes Relative: 12 %
Lymphs Abs: 1.1 10*3/uL (ref 0.7–4.0)
MCH: 30.4 pg (ref 26.0–34.0)
MCHC: 33.7 g/dL (ref 30.0–36.0)
MCV: 90.2 fL (ref 78.0–100.0)
Monocytes Absolute: 0.7 10*3/uL (ref 0.1–1.0)
Monocytes Relative: 8 %
NEUTROS PCT: 79 %
Neutro Abs: 6.8 10*3/uL (ref 1.7–7.7)
Platelets: 42 10*3/uL — ABNORMAL LOW (ref 150–400)
RBC: 2.14 MIL/uL — ABNORMAL LOW (ref 3.87–5.11)
RDW: 15.6 % — ABNORMAL HIGH (ref 11.5–15.5)
WBC: 8.7 10*3/uL (ref 4.0–10.5)

## 2017-02-08 LAB — PREPARE RBC (CROSSMATCH)

## 2017-02-08 LAB — BPAM FFP
BLOOD PRODUCT EXPIRATION DATE: 201809152359
Blood Product Expiration Date: 201809152359
ISSUE DATE / TIME: 201809141049
ISSUE DATE / TIME: 201809141049
UNIT TYPE AND RH: 600
Unit Type and Rh: 6200

## 2017-02-08 LAB — GLUCOSE, CAPILLARY
GLUCOSE-CAPILLARY: 168 mg/dL — AB (ref 65–99)
GLUCOSE-CAPILLARY: 201 mg/dL — AB (ref 65–99)
GLUCOSE-CAPILLARY: 208 mg/dL — AB (ref 65–99)
Glucose-Capillary: 151 mg/dL — ABNORMAL HIGH (ref 65–99)
Glucose-Capillary: 167 mg/dL — ABNORMAL HIGH (ref 65–99)
Glucose-Capillary: 190 mg/dL — ABNORMAL HIGH (ref 65–99)

## 2017-02-08 LAB — PREPARE FRESH FROZEN PLASMA
UNIT DIVISION: 0
Unit division: 0

## 2017-02-08 LAB — CBC
HEMATOCRIT: 26.4 % — AB (ref 36.0–46.0)
HEMOGLOBIN: 9 g/dL — AB (ref 12.0–15.0)
MCH: 30.1 pg (ref 26.0–34.0)
MCHC: 34.1 g/dL (ref 30.0–36.0)
MCV: 88.3 fL (ref 78.0–100.0)
Platelets: 60 10*3/uL — ABNORMAL LOW (ref 150–400)
RBC: 2.99 MIL/uL — AB (ref 3.87–5.11)
RDW: 15.9 % — AB (ref 11.5–15.5)
WBC: 9.3 10*3/uL (ref 4.0–10.5)

## 2017-02-08 LAB — DIC (DISSEMINATED INTRAVASCULAR COAGULATION)PANEL
D-Dimer, Quant: 3.34 ug/mL-FEU — ABNORMAL HIGH (ref 0.00–0.50)
INR: 1.12
Platelets: 61 10*3/uL — ABNORMAL LOW (ref 150–400)
Prothrombin Time: 14.3 seconds (ref 11.4–15.2)
aPTT: 21 seconds — ABNORMAL LOW (ref 24–36)

## 2017-02-08 LAB — DIC (DISSEMINATED INTRAVASCULAR COAGULATION) PANEL
FIBRINOGEN: 379 mg/dL (ref 210–475)
SMEAR REVIEW: NONE SEEN

## 2017-02-08 LAB — MAGNESIUM: Magnesium: 2 mg/dL (ref 1.7–2.4)

## 2017-02-08 SURGERY — EVACUATION HEMATOMA
Anesthesia: General | Site: Groin | Laterality: Left

## 2017-02-08 MED ORDER — SODIUM CHLORIDE 0.9 % IV SOLN: 10.0000 mL/h | Freq: Once | INTRAVENOUS | Status: DC

## 2017-02-08 MED ORDER — SUCCINYLCHOLINE CHLORIDE 20 MG/ML IJ SOLN
INTRAMUSCULAR | Status: DC | PRN
  Administered 2017-02-08: 100 mg via INTRAVENOUS

## 2017-02-08 MED ORDER — DIPHENHYDRAMINE HCL 25 MG PO CAPS
25.0000 mg | ORAL_CAPSULE | Freq: Once | ORAL | Status: AC
  Administered 2017-02-08: 25 mg via ORAL
  Filled 2017-02-08: qty 1

## 2017-02-08 MED ORDER — LACTATED RINGERS IV SOLN
INTRAVENOUS | Status: DC | PRN
  Administered 2017-02-08: 11:00:00 via INTRAVENOUS

## 2017-02-08 MED ORDER — ONDANSETRON HCL 4 MG/2ML IJ SOLN: 4.0000 mg | Freq: Once | INTRAMUSCULAR | Status: DC | PRN

## 2017-02-08 MED ORDER — DEXTROSE 5 % IV SOLN
INTRAVENOUS | Status: DC | PRN
  Administered 2017-02-08: 1.5 g via INTRAVENOUS

## 2017-02-08 MED ORDER — SODIUM CHLORIDE 0.9 % IR SOLN
Status: DC | PRN
  Administered 2017-02-08: 2000 mL

## 2017-02-08 MED ORDER — SODIUM CHLORIDE 0.9 % IV SOLN: Freq: Once | INTRAVENOUS | Status: DC

## 2017-02-08 MED ORDER — MIDAZOLAM HCL 2 MG/2ML IJ SOLN
INTRAMUSCULAR | Status: DC | PRN
  Administered 2017-02-08: 1 mg via INTRAVENOUS

## 2017-02-08 MED ORDER — SODIUM CHLORIDE 0.9 % IV SOLN
Freq: Once | INTRAVENOUS | Status: AC
  Administered 2017-02-08: 10 mL/h via INTRAVENOUS

## 2017-02-08 MED ORDER — FENTANYL CITRATE (PF) 250 MCG/5ML IJ SOLN
INTRAMUSCULAR | Status: AC
Start: 2017-02-08 — End: 2017-02-08
  Filled 2017-02-08: qty 5

## 2017-02-08 MED ORDER — CEFUROXIME SODIUM 750 MG IJ SOLR
INTRAMUSCULAR | Status: AC
  Filled 2017-02-08: qty 1500

## 2017-02-08 MED ORDER — PROPOFOL 10 MG/ML IV BOLUS
INTRAVENOUS | Status: DC | PRN
  Administered 2017-02-08: 120 mg via INTRAVENOUS

## 2017-02-08 MED ORDER — HYDROMORPHONE HCL 1 MG/ML IJ SOLN: 0.2500 mg | INTRAMUSCULAR | Status: DC | PRN

## 2017-02-08 MED ORDER — MIDAZOLAM HCL 2 MG/2ML IJ SOLN
INTRAMUSCULAR | Status: AC
  Filled 2017-02-08: qty 2

## 2017-02-08 MED ORDER — FENTANYL CITRATE (PF) 250 MCG/5ML IJ SOLN
INTRAMUSCULAR | Status: DC | PRN
  Administered 2017-02-08: 100 ug via INTRAVENOUS

## 2017-02-08 MED ORDER — CEFAZOLIN SODIUM-DEXTROSE 1-4 GM/50ML-% IV SOLN
1.0000 g | INTRAVENOUS | Status: AC
  Administered 2017-02-08: 1 g via INTRAVENOUS

## 2017-02-08 MED ORDER — ROCURONIUM BROMIDE 10 MG/ML (PF) SYRINGE
PREFILLED_SYRINGE | INTRAVENOUS | Status: DC | PRN
  Administered 2017-02-08: 40 mg via INTRAVENOUS

## 2017-02-08 MED ORDER — ONDANSETRON HCL 4 MG/2ML IJ SOLN
INTRAMUSCULAR | Status: DC | PRN
  Administered 2017-02-08: 4 mg via INTRAVENOUS

## 2017-02-08 MED ORDER — SUGAMMADEX SODIUM 200 MG/2ML IV SOLN
INTRAVENOUS | Status: DC | PRN
  Administered 2017-02-08: 254 mg via INTRAVENOUS

## 2017-02-08 MED FILL — Sodium Chloride IV Soln 0.9%: INTRAVENOUS | Qty: 250 | Status: AC

## 2017-02-08 MED FILL — Phenylephrine HCl Inj 10 MG/ML: INTRAMUSCULAR | Qty: 2 | Status: AC

## 2017-02-08 SURGICAL SUPPLY — 26 items
CANISTER SUCT 3000ML PPV (MISCELLANEOUS) ×2 IMPLANT
DRAIN CHANNEL 19F RND (DRAIN) ×1 IMPLANT
DRESSING PREVENA PLUS CUSTOM (GAUZE/BANDAGES/DRESSINGS) IMPLANT
DRSG PREVENA PLUS CUSTOM (GAUZE/BANDAGES/DRESSINGS) ×2
ELECT REM PT RETURN 9FT ADLT (ELECTROSURGICAL) ×2
ELECTRODE REM PT RTRN 9FT ADLT (ELECTROSURGICAL) ×1 IMPLANT
EVACUATOR SILICONE 100CC (DRAIN) ×1 IMPLANT
GAUZE SPONGE 4X4 12PLY STRL LF (GAUZE/BANDAGES/DRESSINGS) ×2 IMPLANT
GLOVE SS BIOGEL STRL SZ 7.5 (GLOVE) ×1 IMPLANT
GLOVE SUPERSENSE BIOGEL SZ 7.5 (GLOVE) ×1
GOWN STRL REUS W/ TWL LRG LVL3 (GOWN DISPOSABLE) ×3 IMPLANT
GOWN STRL REUS W/TWL LRG LVL3 (GOWN DISPOSABLE) ×6
KIT BASIN OR (CUSTOM PROCEDURE TRAY) ×2 IMPLANT
KIT ROOM TURNOVER OR (KITS) ×2 IMPLANT
NS IRRIG 1000ML POUR BTL (IV SOLUTION) ×4 IMPLANT
PACK PERIPHERAL VASCULAR (CUSTOM PROCEDURE TRAY) ×1 IMPLANT
PAD ABD 8X10 STRL (GAUZE/BANDAGES/DRESSINGS) ×2 IMPLANT
PAD ARMBOARD 7.5X6 YLW CONV (MISCELLANEOUS) ×4 IMPLANT
STAPLER VISISTAT 35W (STAPLE) ×1 IMPLANT
SUT PROLENE 1 XLH 60 (SUTURE) ×2 IMPLANT
SUT PROLENE 5 0 C 1 24 (SUTURE) ×1 IMPLANT
SUT PROLENE 6 0 CC (SUTURE) ×1 IMPLANT
TAPE CLOTH SURG 4X10 WHT LF (GAUZE/BANDAGES/DRESSINGS) ×1 IMPLANT
TAPE PAPER 3X10 WHT MICROPORE (GAUZE/BANDAGES/DRESSINGS) ×2 IMPLANT
UNDERPAD 30X30 (UNDERPADS AND DIAPERS) ×2 IMPLANT
WATER STERILE IRR 1000ML POUR (IV SOLUTION) ×2 IMPLANT

## 2017-02-08 NOTE — CV Procedure (Signed)
    OPERATIVE REPORT  DATE OF SURGERY: 02/08/2017  PATIENT: Carrie Monroe, 81 y.o. female MRN: [id number]  DOB: Feb 25, 1936  PRE-OPERATIVE DIAGNOSIS: Left groin hematoma status post repair of left common femoral artery afterTAVR procedure on 02/05/2017  POST-OPERATIVE DIAGNOSIS:  Same  PROCEDURE: Evacuation of hematoma and Blake drain placement and primary closure  SURGEON:  Curt Jews, M.D.  PHYSICIAN ASSISTANT: Samantha Rhyne PA-C  ANESTHESIA:  Gen.  EBL: 51 old blood ml  Total I/O In: 671.5 [Blood:671.5] Out: 600 [Urine:300; Blood:300]  BLOOD ADMINISTERED: None  DRAINS: 19 Blake drain  SPECIMEN: None  COUNTS CORRECT:  YES  PLAN OF CARE: PACU stable   PATIENT DISPOSITION:  PACU - hemodynamically stable  PROCEDURE DETAILS: The patient is a 3 day status post emergent repair of the bleeding from left common femoral artery. She was undergoing a VAC drain change this morning and     a large amount of old dark blood from her incision. She had no hypotension associated with this. She has had slow drop in her him hemoglobin and hematocrit. Recommend that she be taken to the operating room for wound exploration and potential bleeding control.  The left groin was prepped in sterile fashion. The incision was opened removing several Vicryl sutures in the subcutaneous fat. The skin had been left open. There was a great deal of old blood present but no arterial or venous active bleeding. The patient had a massive cavity where the large hematoma had been. I could literally get entire fist and forearm into her cavity that extended from her groin extraperitoneally to the left back towards her buttocks. This area was irrigated and there was no bleeding in this area. The arterial closure was examined and there was no bleeding from the arterial closure from several days prior. A 19 Blake drain was brought onto the field and was brought in through a separate stab incision on her anterior  thigh. Into this was positioned back into the wound space after cutting it to the appropriate length. The catheter was secured to the skin with a 3-0 nylon stitch. #1 Prolene sutures were used as a mattress sutures to close the skin and subcutaneous tissue. The skin edges were closed with skin staples. Sterile dressing was applied the patient was transferred to the recovery room in stable condition      Carrie Monroe, M.D., [facility] 02/08/2017 12:13 PM

## 2017-02-08 NOTE — Progress Notes (Signed)
Removed VAC from pt.  Immediately dumped about 500 cc of venous looking blood.  I held pressure and looked again and another 500 cc dumped out.  Pt BP is reasonable unit of blood is hanging.  Will return to OR for reexploration by Dr Early who is also at [place], MD [facility] Office: [phone] Pager: [phone]

## 2017-02-08 NOTE — Progress Notes (Signed)
Dr. Darcey Nora making evening rounds, made aware JP drain putting out >100cc blood q1h and requiring frequent emptying. Pt BP stable and she reports no pain or discomfort at this time. MD acknowledged and suggested hooking drain up to suction, which this RN has done at this time. No orders currently, will continue to monitor closely.

## 2017-02-08 NOTE — Transfer of Care (Signed)
Immediate Anesthesia Transfer of Care Note  Patient: Carrie Monroe  Procedure(s) Performed: Procedure(s): EVACUATION HEMATOMA (Left)  Patient Location: PACU  Anesthesia Type:General  Level of Consciousness: drowsy and patient cooperative  Airway & Oxygen Therapy: Patient Spontanous Breathing and Patient connected to nasal cannula oxygen  Post-op Assessment: Report given to RN, Post -op Vital signs reviewed and stable and Patient moving all extremities X 4  Post vital signs: Reviewed and stable  Last Vitals:  Vitals:   02/08/17 1025 02/08/17 1035  BP: (!) 153/65 (!) 165/55  Pulse: 90 88  Resp: (!) 25 (!) 21  Temp:  36.9 C  SpO2: 94% 93%    Last Pain:  Vitals:   02/08/17 1035  TempSrc: Oral  PainSc:       Patients Stated Pain Goal: 0 (71/16/57 9038)  Complications: No apparent anesthesia complications

## 2017-02-08 NOTE — Progress Notes (Signed)
      [address]       [place] [address]             [phone]        CARDIOTHORACIC SURGERY PROGRESS NOTE   R3 Days Post-Op Procedure(s) (LRB): Evacuation of Retroperitoneal Hematoma and Primary Repair of Femoral Artery and FEMORAL ARTERY EXPLORATION (N/A) APPLICATION OF WOUND VAC (Left)  Subjective: Looks good and feels okay.  Some soreness left groin and flank.  No SOB  Objective: Vital signs: BP Readings from Last 1 Encounters:  02/08/17 (!) 162/63   Pulse Readings from Last 1 Encounters:  02/08/17 69   Resp Readings from Last 1 Encounters:  02/08/17 16   Temp Readings from Last 1 Encounters:  02/08/17 (P) 98.7 F (37.1 C) ((P) Oral)    Hemodynamics: CVP:  [3 mmHg] 3 mmHg  Physical Exam:  Rhythm:   sinus  Breath sounds: clear  Heart sounds:  RRR w/out murmur  Incisions:  Wound VAC intact w/ relatively low serosanguinous output  Abdomen:  Soft, non-distended, mildly tender left groin/flank  Extremities:  Warm, well-perfused   Intake/Output from previous day: 09/13 0701 - 09/14 0700 In: 2100 [P.O.:1850; I.V.:220; Blood:30] Out: 5050 [Urine:5050] Intake/Output this shift: Total I/O In: 356.5 [Blood:356.5] Out: -   Lab Results:  CBC: Recent Labs  02/07/17 0439 02/08/17 0437  WBC 12.5* 8.7  HGB 8.6* 6.5*  HCT 25.3* 19.3*  PLT 45* 42*    BMET:  Recent Labs  02/07/17 0439 02/08/17 0437  NA 139 136  K 4.9 3.4*  CL 109 107  CO2 23 26  GLUCOSE 144* 170*  BUN 42* 47*  CREATININE 2.36* 2.48*  CALCIUM 8.1* 7.9*     PT/INR:   Recent Labs  02/05/17 1518  LABPROT 16.8*  INR 1.38    CBG (last 3)   Recent Labs  02/08/17 0004 02/08/17 0405 02/08/17 0833  GLUCAP 190* 168* 151*    ABG    Component Value Date/Time   PHART 7.425 02/06/2017 0952   PCO2ART 37.7 02/06/2017 0952   PO2ART 121.0 (H) 02/06/2017 0952   HCO3 24.8 02/06/2017 0952   TCO2 24 02/06/2017 1624   ACIDBASEDEF 4.0 (H) 02/05/2017 1545   O2SAT  99.0 02/06/2017 0952    CXR: n/a  Assessment/Plan: S/P Procedure(s) (LRB): Evacuation of Retroperitoneal Hematoma and Primary Repair of Femoral Artery and FEMORAL ARTERY EXPLORATION (N/A) APPLICATION OF WOUND VAC (Left)  Clinically stable although significant drop in Hgb overnight.  Severe thrombocytopenia persists.  Likely still coagulopathic.  I agree w/ plans for transfusion PRBCs, platelets and likely FFP and cryo depending on results of DIC panel.  Hold ASA and Plavix for now.  CT abd/pelvis reasonable to look for retroperitoneal hematoma.  Wound VAC needs to be changed.  Will discuss w/ Dr Brabham/VVS  I spent in excess of 15 minutes during the conduct of this hospital encounter and >50% of this time involved direct face-to-face encounter with the patient for counseling and/or coordination of their care.     Rexene Alberts, MD 02/08/2017 8:57 AM

## 2017-02-08 NOTE — Anesthesia Procedure Notes (Signed)
Procedure Name: Intubation Date/Time: 02/08/2017 11:08 AM Performed by: Julieta Bellini Pre-anesthesia Checklist: Patient identified, Emergency Drugs available, Suction available and Patient being monitored Patient Re-evaluated:Patient Re-evaluated prior to induction Oxygen Delivery Method: Circle system utilized Preoxygenation: Pre-oxygenation with 100% oxygen Induction Type: IV induction, Rapid sequence and Cricoid Pressure applied Laryngoscope Size: Mac and 4 Grade View: Grade I Tube type: Oral Tube size: 7.0 mm Number of attempts: 1 Airway Equipment and Method: Stylet Placement Confirmation: ETT inserted through vocal cords under direct vision,  positive ETCO2 and breath sounds checked- equal and bilateral Secured at: 21 cm Tube secured with: Tape Dental Injury: Teeth and Oropharynx as per pre-operative assessment

## 2017-02-08 NOTE — Progress Notes (Signed)
CT surgery p.m. Rounds  Patient feels much more comfortable after evacuation of left retroperitoneal hematoma Serosanguineous drainage being drained by Jackson-Pratt system Vital signs stable

## 2017-02-08 NOTE — Progress Notes (Signed)
Procedures CRITICAL VALUE ALERT  Critical Value:  HGB 6.5  Date & Time Notied:  02/08/2017 0500  Provider Notified: Fransico Him MD  Orders Received/Actions taken:  1 unit PRBC w/ benadryl  Eleonore Chiquito RN 2 Heart

## 2017-02-08 NOTE — Progress Notes (Addendum)
Advanced Heart Failure Rounding Note  Primary Cardiologist: Croituru/McAlhany  Subjective:    Pt underwent elective TAVR procedure 02/05/17. Post op, developed profound hypotension, groin hematoma, and marked anemia. HF team called to assist with profound shock. Intubated. She required pressor support and 18 total units of PRBCs, Cryoprecipitate, FFP, and platelets.  She was taken emergently to OR for closure of her femoral artery.    Tired. Didn't sleep well.  Denies SOB. Has not had a BM. Good UO via foley.  She denies any increased tenderness at her groin site or along her abdomen. Denies lightheadedness or dizziness.   Hgb down again at 6.5 this am. Ordered for 1 unit so far.   Weight down 5 lbs. Remains up ~ 30 lbs from pre-op. Negative 2.9 L on lasix 40 IV BID.   Creatinine 1.7 -> 2.36 -> 2.48. Likely from ATN after profound shock 02/05/17.  Echo 02/06/17 LVEF 50-55%, Grade 1 DD, Stable AV, Moderately calcified annulus. Left pleural effusion.  Objective:   Weight Range: 280 lb 3.3 oz (127.1 kg) Body mass index is 49.64 kg/m.   Vital Signs:   Temp:  [97.8 F (36.6 C)-99.2 F (37.3 C)] 97.8 F (36.6 C) (09/14 0648) Pulse Rate:  [65-103] 69 (09/14 0715) Resp:  [14-21] 16 (09/14 0715) BP: (100-162)/(44-76) 162/63 (09/14 0715) SpO2:  [92 %-100 %] 99 % (09/14 0715) Arterial Line BP: (106-153)/(42-48) 123/43 (09/13 1022) Weight:  [280 lb 3.3 oz (127.1 kg)] 280 lb 3.3 oz (127.1 kg) (09/14 0500) Last BM Date:  (UTA)  Weight change: Filed Weights   02/06/17 0433 02/07/17 0442 02/08/17 0500  Weight: 277 lb 1.9 oz (125.7 kg) 285 lb 11.5 oz (129.6 kg) 280 lb 3.3 oz (127.1 kg)    Intake/Output:   Intake/Output Summary (Last 24 hours) at 02/08/17 0739 Last data filed at 02/08/17 0715  Gross per 24 hour  Intake             2144 ml  Output             5050 ml  Net            -2906 ml      Physical Exam     General:Elderly appearing. Lying in bed. NAD.  HEENT:  Normal. O2 via [place] Neck: Supple. JVP 4-5 cm (difficult due to body habitus). Carotids 2+ bilat; no bruits. No thyromegaly or nodule noted. Cor: PMI nondisplaced. RRR, 2/6 AS murmur crisp S2.  Lungs: Clear anteriorly. Abdomen: Morbidly obese. +Distended with marked ecchymosis and edema over left flank. She is somewhat tender but not out of proportion to exam. No bruits or masses. +BS  Extremities: No cyanosis, clubbing, or rash. 1-2+ edema bilaterally. Left femoral wound vac stable.  Neuro: Alert & orientedx3, cranial nerves grossly intact. moves all 4 extremities w/o difficulty. Affect pleasant  GU: Foley in place.   Telemetry   NSR in 80s, Personally reviewed.   EKG    NSR 81 bpm this am. Personally reviewed.  Labs    CBC  Recent Labs  02/07/17 0439 02/08/17 0437  WBC 12.5* 8.7  NEUTROABS 9.6* 6.8  HGB 8.6* 6.5*  HCT 25.3* 19.3*  MCV 88.2 90.2  PLT 45* 42*   Basic Metabolic Panel  Recent Labs  02/07/17 0439 02/08/17 0437  NA 139 136  K 4.9 3.4*  CL 109 107  CO2 23 26  GLUCOSE 144* 170*  BUN 42* 47*  CREATININE 2.36* 2.48*  CALCIUM 8.1* 7.9*  MG 2.2 2.0  PHOS 5.5* 4.1   Liver Function Tests  Recent Labs  02/05/17 1242 02/05/17 1857 02/07/17 0439 02/08/17 0437  AST 27 64*  --   --   ALT 11* 36  --   --   ALKPHOS 37* 34*  --   --   BILITOT 0.6 2.1*  --   --   PROT 3.9* 3.9*  --   --   ALBUMIN 2.0* 2.3* 2.0* 1.9*   No results for input(s): LIPASE, AMYLASE in the last 72 hours. Cardiac Enzymes No results for input(s): CKTOTAL, CKMB, CKMBINDEX, TROPONINI in the last 72 hours.  BNP: BNP (last 3 results) No results for input(s): BNP in the last 8760 hours.  ProBNP (last 3 results) No results for input(s): PROBNP in the last 8760 hours.   D-Dimer  Recent Labs  02/05/17 1438  DDIMER 7.66*   Hemoglobin A1C No results for input(s): HGBA1C in the last 72 hours. Fasting Lipid Panel No results for input(s): CHOL, HDL, LDLCALC, TRIG, CHOLHDL,  LDLDIRECT in the last 72 hours. Thyroid Function Tests No results for input(s): TSH, T4TOTAL, T3FREE, THYROIDAB in the last 72 hours.  Invalid input(s): FREET3  Other results:   Imaging    No results found.   Medications:     Scheduled Medications: . acetaminophen  1,000 mg Oral Q6H   Or  . acetaminophen (TYLENOL) oral liquid 160 mg/5 mL  1,000 mg Per Tube Q6H  . aspirin EC  81 mg Oral Daily  . chlorhexidine gluconate (MEDLINE KIT)  15 mL Mouth Rinse BID  . Chlorhexidine Gluconate Cloth  6 each Topical Daily  . clopidogrel  75 mg Oral Daily  . furosemide  40 mg Intravenous BID  . insulin aspart  0-24 Units Subcutaneous Q4H  . insulin detemir  28 Units Subcutaneous BID  . insulin regular  0-10 Units Intravenous TID WC  . mouth rinse  15 mL Mouth Rinse QID  . metoprolol tartrate  25 mg Oral BID  . rosuvastatin  5 mg Oral q1800  . sodium chloride flush  3 mL Intravenous Q12H    Infusions: . sodium chloride    . sodium chloride    . sodium chloride    . dexmedetomidine (PRECEDEX) IV infusion Stopped (02/06/17 1000)  . insulin (NOVOLIN-R) infusion Stopped (02/06/17 1400)  . lactated ringers    . norepinephrine (LEVOPHED) Adult infusion Stopped (02/07/17 0442)    PRN Medications: sodium chloride, docusate sodium, fentaNYL (SUBLIMAZE) injection, lactated ringers, metoprolol tartrate, ondansetron (ZOFRAN) IV, oxyCODONE-acetaminophen, sodium chloride flush, sodium chloride flush    Patient Profile   Carrie Monroe is a 81 year old with history of AS, CAD, HTN, CKD Stage III, DMII, hyperlipidemia admitted for TAVR.   Post op developed hypotension with hemorrhagic shock.  Assessment/Plan   1. Hemorrhagic shock - Hgb dropped from 10.1 PTA to 6.1 post op.  - Hgb back down to 6.5 this am. Ordered for 1 unit of PRBCs. Will order additional unit and discuss need for [place] with VVS - Received total 18 units of combination of PRBCs, platelets, cryo, and FFP.  - Required  emergent OR with closure of her R femoral artery.  2. Acute respiratory failure - Intubated with shock 02/05/17. Extubated 02/06/17 after mild diuresis - Stable on O2 via [place] currently. No change. 3. Acute on chronic diastolic CHF - Echo 11/30/21 LVEF 60-65%, Grade 2 DD -> Echo 02/06/17 LVEF 50-55% s/p TAVR.  - Volume status markedly elevated peripherally but  CVP in 4-6 range.  - Per Dr. Haroldine Laws will hold lasix this am with drop in Hgb.  4. Aortic Stenosis s/p TAVR - s/p TAVR with 26 mm [employer] Bioprosth valve - ASA and plavix on hold with bleeding - Echo 02/06/17 with stable AVR. No change.  5. S/p closure of L femoral artery - Wound vac in place and stable. VVS following.  With drop in Hgb may need [place]. Will discuss with VVS.  6. AKI - Likely a combination of hypotension and possible intravascular volume depletion    Length of Stay: 3  Carrie Friar, PA-C  02/08/2017, 7:39 AM  Advanced Heart Failure Team Pager [phone] (M-F; 7a - 4p)  Please contact [facility] for night-coverage after hours (4p -7a ) and weekends on [email or web address]  Patient seen and examined with the above-signed Advanced Practice Provider and/or Housestaff. I personally reviewed laboratory data, imaging studies and relevant notes. I independently examined the patient and formulated the important aspects of the plan. I have edited the note to reflect any of my changes or salient points. I have personally discussed the plan with the patient and/or family.  Hgb continues to drop. No blood in wound vac. Getting 2u RBCs now. BP stable. Creatinine slightly worse. Due to previous hypotension +/- diuresis. Hold lasix for now until creatinine turns the corner.   Discussed anemia with Drs. Erin Sons, [employer] and [employer]. Will check CT scan. Also check fibrinogen (give cryo if < 150). PLTs low due to dilution thrombocytopenia will give some platelets.  Will d/w Wound Vac change with VVS.   Carrie Monroe, Daniel, Carrie Monroe    8:49 AM  Addendum:  Wound vac replaced today and patient taken back to OR due to residual hematoma. No active bleeding identified.   The AHF team will sign off. Please call us if we can help.   Carrie Bickers, Carrie Monroe  2:08 PM

## 2017-02-08 NOTE — Anesthesia Preprocedure Evaluation (Signed)
Anesthesia Evaluation  Patient identified by MRN, date of birth, ID band Patient awake    Reviewed: Allergy & Precautions, H&P , NPO status , Patient's Chart, lab work & pertinent test results, reviewed documented beta blocker date and time Preop documentation limited or incomplete due to emergent nature of procedure.  Airway Mallampati: III  TM Distance: >3 FB Neck ROM: Full    Dental no notable dental hx. (+) Poor Dentition, Dental Advisory Given   Pulmonary neg pulmonary ROS, shortness of breath, with exertion, at rest and lying,    Pulmonary exam normal breath sounds clear to auscultation       Cardiovascular Exercise Tolerance: Good hypertension, Pt. on medications and Pt. on home beta blockers + angina with exertion + CAD, + Cardiac Stents, + Peripheral Vascular Disease and +CHF  + Valvular Problems/Murmurs AS  Rhythm:Regular Rate:Normal + Systolic murmurs S/P TAVR on 6/80 complicated by posterior puncture of iliac artery which required urgent repair. Patient is again bleeding from iliac artery with hematoma in need of urgent evacuation and re exploration of iliac artery   Neuro/Psych negative neurological ROS  negative psych ROS   GI/Hepatic negative GI ROS, Neg liver ROS,   Endo/Other  diabetes, Well Controlled, Type 2, Oral Hypoglycemic AgentsMorbid obesityGout  Renal/GU Renal InsufficiencyRenal disease  negative genitourinary   Musculoskeletal  (+) Arthritis , Osteoarthritis,    Abdominal (+) + obese,   Peds  Hematology  (+) anemia , Thrombocytopenia   Anesthesia Other Findings   Reproductive/Obstetrics negative OB ROS                             Anesthesia Physical  Anesthesia Plan  ASA: IV and emergent  Anesthesia Plan: General   Post-op Pain Management:    Induction: Intravenous  PONV Risk Score and Plan: 3 and Ondansetron, Midazolam, Treatment may vary due to age or  medical condition and Propofol infusion  Airway Management Planned: Oral ETT  Additional Equipment: Arterial line and CVP  Intra-op Plan:   Post-operative Plan: Possible Post-op intubation/ventilation  Informed Consent: I have reviewed the patients History and Physical, chart, labs and discussed the procedure including the risks, benefits and alternatives for the proposed anesthesia with the patient or authorized representative who has indicated his/her understanding and acceptance.   Dental advisory given  Plan Discussed with: CRNA, Surgeon and Anesthesiologist  Anesthesia Plan Comments:         Anesthesia Quick Evaluation

## 2017-02-08 NOTE — Anesthesia Postprocedure Evaluation (Signed)
Anesthesia Post Note  Patient: Carrie Monroe  Procedure(s) Performed: Procedure(s) (LRB): EVACUATION HEMATOMA (Left)     Patient location during evaluation: PACU Anesthesia Type: General Level of consciousness: awake and alert Pain management: pain level controlled Vital Signs Assessment: post-procedure vital signs reviewed and stable Respiratory status: spontaneous breathing, nonlabored ventilation, respiratory function stable and patient connected to nasal cannula oxygen Cardiovascular status: blood pressure returned to baseline and stable Postop Assessment: no apparent nausea or vomiting Anesthetic complications: no    Last Vitals:  Vitals:   02/08/17 1210 02/08/17 1245  BP: (!) 139/51 (!) 150/70  Pulse:  78  Resp:  14  Temp:    SpO2:  100%    Last Pain:  Vitals:   02/08/17 1035  TempSrc: Oral  PainSc:                  Davieon Stockham A.

## 2017-02-08 NOTE — Progress Notes (Signed)
[place] [place] VALVE TEAM   Patient Name: Carrie Monroe Date of Encounter: 02/08/2017  Primary Cardiologist: Dr. [facility] Problem List     Active Problems:   S/P TAVR (transcatheter aortic valve replacement)   Acute respiratory failure with hypoxia (HCC)   Hemorrhagic shock (HCC)     Subjective   Sleepy this morning. Groin still tender. No other complaints.   Inpatient Medications    Scheduled Meds: . acetaminophen  1,000 mg Oral Q6H   Or  . acetaminophen (TYLENOL) oral liquid 160 mg/5 mL  1,000 mg Per Tube Q6H  . aspirin EC  81 mg Oral Daily  . chlorhexidine gluconate (MEDLINE KIT)  15 mL Mouth Rinse BID  . Chlorhexidine Gluconate Cloth  6 each Topical Daily  . clopidogrel  75 mg Oral Daily  . insulin aspart  0-24 Units Subcutaneous Q4H  . insulin detemir  28 Units Subcutaneous BID  . insulin regular  0-10 Units Intravenous TID WC  . mouth rinse  15 mL Mouth Rinse QID  . metoprolol tartrate  25 mg Oral BID  . rosuvastatin  5 mg Oral q1800  . sodium chloride flush  3 mL Intravenous Q12H   Continuous Infusions: . sodium chloride    . sodium chloride    . sodium chloride    . sodium chloride    . dexmedetomidine (PRECEDEX) IV infusion Stopped (02/06/17 1000)  . insulin (NOVOLIN-R) infusion Stopped (02/06/17 1400)  . lactated ringers    . norepinephrine (LEVOPHED) Adult infusion Stopped (02/07/17 0442)   PRN Meds: sodium chloride, docusate sodium, fentaNYL (SUBLIMAZE) injection, lactated ringers, metoprolol tartrate, ondansetron (ZOFRAN) IV, oxyCODONE-acetaminophen, sodium chloride flush, sodium chloride flush   Vital Signs    Vitals:   02/08/17 0700 02/08/17 0705 02/08/17 0710 02/08/17 0715  BP: (!) 131/58 137/76 (!) 147/66 (!) 162/63  Pulse: 70 70 74 69  Resp: 14 15 (!) 21 16  Temp:      TempSrc:      SpO2: 99% 100% 97% 99%  Weight:      Height:        Intake/Output Summary (Last 24 hours)  at 02/08/17 0801 Last data filed at 02/08/17 0715  Gross per 24 hour  Intake             2144 ml  Output             5050 ml  Net            -2906 ml   Filed Weights   02/06/17 0433 02/07/17 0442 02/08/17 0500  Weight: 277 lb 1.9 oz (125.7 kg) 285 lb 11.5 oz (129.6 kg) 280 lb 3.3 oz (127.1 kg)    Physical Exam   GEN: Well nourished, well developed, in no acute distress. Morbidly obese HEENT: Grossly normal.  Neck: Supple, no JVD, carotid bruits, or masses. Cardiac: RRR, 2/6 soft murmur @RUSB , rubs, or gallops. No clubbing, cyanosis, edema.  Radials/DP/PT 2+ and equal bilaterally.  Respiratory:  Respirations regular and unlabored, clear to auscultation bilaterally. GI: Soft, nontender, nondistended, BS + x 4. MS: no deformity or atrophy. Skin: warm and dry, no rash. Large, tender left groin hematoma with wound vac in place. Also moderate hematoma In right groin Neuro:  Strength and sensation are intact. Psych: AAOx3.  Normal affect.  Labs    CBC  Recent Labs  02/07/17 0439 02/08/17 0437  WBC 12.5* 8.7  NEUTROABS 9.6* 6.8  HGB 8.6* 6.5*  HCT 25.3* 19.3*  MCV 88.2 90.2  PLT 45* 42*   Basic Metabolic Panel  Recent Labs  02/07/17 0439 02/08/17 0437  NA 139 136  K 4.9 3.4*  CL 109 107  CO2 23 26  GLUCOSE 144* 170*  BUN 42* 47*  CREATININE 2.36* 2.48*  CALCIUM 8.1* 7.9*  MG 2.2 2.0  PHOS 5.5* 4.1   Liver Function Tests  Recent Labs  02/05/17 1242 02/05/17 1857 02/07/17 0439 02/08/17 0437  AST 27 64*  --   --   ALT 11* 36  --   --   ALKPHOS 37* 34*  --   --   BILITOT 0.6 2.1*  --   --   PROT 3.9* 3.9*  --   --   ALBUMIN 2.0* 2.3* 2.0* 1.9*   No results for input(s): LIPASE, AMYLASE in the last 72 hours. Cardiac Enzymes No results for input(s): CKTOTAL, CKMB, CKMBINDEX, TROPONINI in the last 72 hours. BNP Invalid input(s): POCBNP D-Dimer  Recent Labs  02/05/17 1438  DDIMER 7.66*   Hemoglobin A1C No results for input(s): HGBA1C in the last 72  hours. Fasting Lipid Panel No results for input(s): CHOL, HDL, LDLCALC, TRIG, CHOLHDL, LDLDIRECT in the last 72 hours. Thyroid Function Tests No results for input(s): TSH, T4TOTAL, T3FREE, THYROIDAB in the last 72 hours.  Invalid input(s): FREET3  Telemetry    NSR - Personally Reviewed  ECG    NSR, LBBB - Personally Reviewed  Radiology    Dg Chest Port 1 View  Result Date: 02/07/2017 CLINICAL DATA:  Status post transcatheter aortic valve replacement on February 05, 2017 EXAM: PORTABLE CHEST 1 VIEW COMPARISON:  Portable chest x-ray of February 06, 2017 FINDINGS: The lungs are well-expanded. Bibasilar densities persist. There is no pneumothorax or significant pleural effusion. There is subsegmental atelectasis in the left lower lung. The cardiac silhouette is top-normal in size. The pulmonary vascularity is less congested. There is calcification in the wall of the aortic arch. The right internal jugular venous catheter tip projects over the midportion of the SVC. IMPRESSION: Improving CHF. Bibasilar subsegmental atelectasis. No significant pleural effusion. Thoracic aortic athero scleroses. Electronically Signed   By: David  Martinique M.D.   On: 02/07/2017 07:33    Cardiac Studies   2D ECHO: 02/06/2017 LV EF: 50% -55% Study Conclusions - Left ventricle: The cavity size was normal. There was mild   concentric hypertrophy. Systolic function was normal. The   estimated ejection fraction was in the range of 50% to 55%. Wall   motion was normal; there were no regional wall motion   abnormalities. Doppler parameters are consistent with abnormal   left ventricular relaxation (grade 1 diastolic dysfunction). - Ventricular septum: Septal motion showed abnormal function and   dyssynergy. - Aortic valve: Medtronic CoreValve Evolut Pro (size 61m) was   functioning normally. Peak velocity (S): 156 cm/s. Mean gradient   (S): 5 mm Hg. Valve area (Vmax): 1.38 cm^2. - Mitral valve: Moderately  calcified annulus. - Pericardium, extracardiac: There was a left pleural effusion.  Patient Profile     81y.o. female with history of chronic diastolic CHF, HTN, HLD, DM, gout, breast cancer, CAD, severe aortic stenosis and severe carotid artery disease who was admitted following TAVR on 02/05/17. Following the case she had an active bleed from her left groin that we were unable to control with manual hemostasis. She had a significant blood loss with shock and was taken to the OR for femoral artery repair.  Assessment & Plan    Severe AS: s/p successful TAVR with a  26 mm Medtronic CoreValve Evolut Pro via L TF approach on 02/05/17. Post operative echo 02/06/17 showed good valve placement with no PVL. ECG with new LBBB but no bradyarrhythmia. ASA and Plavix initially held with significant bleeding, then resumed yesterday. Hg dropped to 6.5 last night and she is receiving more PRBCs. Will hold these until her Hg stabilizes. ( we would like to resume this as soon as possible given recent stenting and TAVR).   Acute blood loss anemia: Her Hg stabilized but then dropped to 6.5 this morning. She has gotten 1 unit of PRBCs. CHF ordered an additional unit this morning. Will order a CT abdomen pelvis wo contrast to assess for retroperitoneal bleeding.   Thrombocytopenia: PLT 42. Will also transfuse platelets and do a DIC panel. If fibrinogen under 150, will transfuse 13 units.   Left groin hematoma: Post op she had a large hematoma with active bleeding resulting in hypovolemic shock requiring pressors and resuscitation with 18 units of pRBCs, platelets, cryo, FFP and closure of the artery in the OR. Wound vac being following by [facility]. Her Hg stabilized but then dropped to 6.5 last night. She has gotten 1 unit of PRBCs. CHF ordered an additional unit this morning. They plan to call [facility] to discuss need for repeat groin [place] given drop in Hg.  Acute respiratory failure/shock: now extubated and doing well. Now off  pressors   Acute on chronic diastolic CHF: up 30 lbs from pre op. She was placed on IV Lasix 72m BID by CHF team. Now lasix on hold with drop in Hg.  AKI: creatinine 1.7 -> 2.36--> 2.48. Likely from ATN 2/2 hypovolemic shock. Continue to monitor.   CAD: she underwent DES to mLAD, PCTA to oDiag, and DES to LCx on 12/28/16. Plan to resume DAPT with ASA/plavix as soon as Hg stabilizes.   Carotid artery disease: she will require R CEA after she recovers from TAVR  Signed, KAngelena Form PA-C  02/08/2017, 8:01 AM   I have personally seen and examined this patient with KNell Range PA-C I agree with the assessment and plan as outlined above. She is feeling well this am. Her Hgb has dropped down to 6.5. One unit of blood last night. Plans for one more unit this am. Will check DIC panel. If Fibrinogen less than 150, will give one unit of cryo per every 10 kg body weight. Will give platelets this am. No clear source of bleeding but cannot exclude slow bleed from surgical site. Will arrange non contrast CT this am with plans for repeat non contrast CT tomorrow to assess for bleeding. Although not ideal given recent DES placement and TAVR, will have to hold anti-platelet therapy for now. Restart ASA and Plavix if no evidence of bleeding. CHF team following and appreciate their help. HOld Lasix this am but still volume overloaded with third spacing. Low CVP.   CLauree Chandler9/14/2018 8:55 AM

## 2017-02-09 ENCOUNTER — Encounter (HOSPITAL_COMMUNITY): Payer: Self-pay | Admitting: Vascular Surgery

## 2017-02-09 LAB — TYPE AND SCREEN
ABO/RH(D): A POS
ANTIBODY SCREEN: NEGATIVE
UNIT DIVISION: 0
UNIT DIVISION: 0
UNIT DIVISION: 0
UNIT DIVISION: 0
UNIT DIVISION: 0
UNIT DIVISION: 0
UNIT DIVISION: 0
UNIT DIVISION: 0
UNIT DIVISION: 0
UNIT DIVISION: 0
UNIT DIVISION: 0
UNIT DIVISION: 0
UNIT DIVISION: 0
UNIT DIVISION: 0
UNIT DIVISION: 0
UNIT DIVISION: 0
UNIT DIVISION: 0
UNIT DIVISION: 0
Unit division: 0
Unit division: 0
Unit division: 0
Unit division: 0
Unit division: 0
Unit division: 0
Unit division: 0
Unit division: 0
Unit division: 0
Unit division: 0
Unit division: 0
Unit division: 0
Unit division: 0
Unit division: 0
Unit division: 0
Unit division: 0
Unit division: 0
Unit division: 0
Unit division: 0
Unit division: 0
Unit division: 0
Unit division: 0
Unit division: 0

## 2017-02-09 LAB — BPAM RBC
BLOOD PRODUCT EXPIRATION DATE: 201809182359
BLOOD PRODUCT EXPIRATION DATE: 201809252359
BLOOD PRODUCT EXPIRATION DATE: 201809252359
BLOOD PRODUCT EXPIRATION DATE: 201810012359
BLOOD PRODUCT EXPIRATION DATE: 201810012359
BLOOD PRODUCT EXPIRATION DATE: 201810022359
BLOOD PRODUCT EXPIRATION DATE: 201810022359
BLOOD PRODUCT EXPIRATION DATE: 201810022359
BLOOD PRODUCT EXPIRATION DATE: 201810022359
BLOOD PRODUCT EXPIRATION DATE: 201810022359
BLOOD PRODUCT EXPIRATION DATE: 201810022359
BLOOD PRODUCT EXPIRATION DATE: 201810022359
BLOOD PRODUCT EXPIRATION DATE: 201810022359
BLOOD PRODUCT EXPIRATION DATE: 201810022359
BLOOD PRODUCT EXPIRATION DATE: 201810022359
BLOOD PRODUCT EXPIRATION DATE: 201810022359
BLOOD PRODUCT EXPIRATION DATE: 201810032359
BLOOD PRODUCT EXPIRATION DATE: 201810042359
BLOOD PRODUCT EXPIRATION DATE: 201810042359
BLOOD PRODUCT EXPIRATION DATE: 201810042359
BLOOD PRODUCT EXPIRATION DATE: 201810042359
BLOOD PRODUCT EXPIRATION DATE: 201810042359
BLOOD PRODUCT EXPIRATION DATE: 201810042359
BLOOD PRODUCT EXPIRATION DATE: 201810102359
Blood Product Expiration Date: 201809252359
Blood Product Expiration Date: 201809252359
Blood Product Expiration Date: 201809252359
Blood Product Expiration Date: 201809272359
Blood Product Expiration Date: 201809272359
Blood Product Expiration Date: 201810012359
Blood Product Expiration Date: 201810012359
Blood Product Expiration Date: 201810012359
Blood Product Expiration Date: 201810022359
Blood Product Expiration Date: 201810022359
Blood Product Expiration Date: 201810022359
Blood Product Expiration Date: 201810022359
Blood Product Expiration Date: 201810032359
Blood Product Expiration Date: 201810032359
Blood Product Expiration Date: 201810032359
Blood Product Expiration Date: 201810042359
Blood Product Expiration Date: 201810102359
ISSUE DATE / TIME: 201809111132
ISSUE DATE / TIME: 201809111132
ISSUE DATE / TIME: 201809111132
ISSUE DATE / TIME: 201809111251
ISSUE DATE / TIME: 201809111251
ISSUE DATE / TIME: 201809111319
ISSUE DATE / TIME: 201809111319
ISSUE DATE / TIME: 201809111321
ISSUE DATE / TIME: 201809111321
ISSUE DATE / TIME: 201809111347
ISSUE DATE / TIME: 201809120812
ISSUE DATE / TIME: 201809121855
ISSUE DATE / TIME: 201809131633
ISSUE DATE / TIME: 201809140241
ISSUE DATE / TIME: 201809140642
ISSUE DATE / TIME: 201809141049
ISSUE DATE / TIME: 201809141403
ISSUE DATE / TIME: 201809142041
ISSUE DATE / TIME: 201809142130
ISSUE DATE / TIME: 201809142130
ISSUE DATE / TIME: 201809142130
ISSUE DATE / TIME: 201809142348
ISSUE DATE / TIME: 201809111006
ISSUE DATE / TIME: 201809111006
ISSUE DATE / TIME: 201809111132
ISSUE DATE / TIME: 201809111321
ISSUE DATE / TIME: 201809111321
ISSUE DATE / TIME: 201809111326
ISSUE DATE / TIME: 201809111326
ISSUE DATE / TIME: 201809111332
ISSUE DATE / TIME: 201809111337
ISSUE DATE / TIME: 201809111342
ISSUE DATE / TIME: 201809121103
ISSUE DATE / TIME: 201809130844
ISSUE DATE / TIME: 201809132216
ISSUE DATE / TIME: 201809140829
ISSUE DATE / TIME: 201809141747
ISSUE DATE / TIME: 201809142130
ISSUE DATE / TIME: 201809150142
ISSUE DATE / TIME: 201809150349
ISSUE DATE / TIME: 201809150349
UNIT TYPE AND RH: 6200
UNIT TYPE AND RH: 6200
UNIT TYPE AND RH: 6200
UNIT TYPE AND RH: 6200
UNIT TYPE AND RH: 6200
UNIT TYPE AND RH: 6200
UNIT TYPE AND RH: 6200
UNIT TYPE AND RH: 6200
UNIT TYPE AND RH: 6200
UNIT TYPE AND RH: 6200
UNIT TYPE AND RH: 6200
UNIT TYPE AND RH: 6200
UNIT TYPE AND RH: 6200
UNIT TYPE AND RH: 6200
UNIT TYPE AND RH: 6200
UNIT TYPE AND RH: 6200
UNIT TYPE AND RH: 6200
UNIT TYPE AND RH: 6200
UNIT TYPE AND RH: 6200
UNIT TYPE AND RH: 6200
Unit Type and Rh: 5100
Unit Type and Rh: 5100
Unit Type and Rh: 600
Unit Type and Rh: 6200
Unit Type and Rh: 6200
Unit Type and Rh: 6200
Unit Type and Rh: 6200
Unit Type and Rh: 6200
Unit Type and Rh: 6200
Unit Type and Rh: 6200
Unit Type and Rh: 6200
Unit Type and Rh: 6200
Unit Type and Rh: 6200
Unit Type and Rh: 6200
Unit Type and Rh: 6200
Unit Type and Rh: 6200
Unit Type and Rh: 6200
Unit Type and Rh: 6200
Unit Type and Rh: 6200
Unit Type and Rh: 6200
Unit Type and Rh: 6200

## 2017-02-09 LAB — RENAL FUNCTION PANEL
ANION GAP: 4 — AB (ref 5–15)
Albumin: 2.1 g/dL — ABNORMAL LOW (ref 3.5–5.0)
BUN: 40 mg/dL — AB (ref 6–20)
CALCIUM: 8 mg/dL — AB (ref 8.9–10.3)
CO2: 26 mmol/L (ref 22–32)
Chloride: 108 mmol/L (ref 101–111)
Creatinine, Ser: 1.91 mg/dL — ABNORMAL HIGH (ref 0.44–1.00)
GFR calc Af Amer: 28 mL/min — ABNORMAL LOW (ref >60)
GFR calc non Af Amer: 24 mL/min — ABNORMAL LOW (ref >60)
GLUCOSE: 123 mg/dL — AB (ref 65–99)
Phosphorus: 3 mg/dL (ref 2.5–4.6)
Potassium: 3.8 mmol/L (ref 3.5–5.1)
SODIUM: 138 mmol/L (ref 135–145)

## 2017-02-09 LAB — CBC WITH DIFFERENTIAL/PLATELET
BASOS PCT: 0 %
Basophils Absolute: 0 10*3/uL (ref 0.0–0.1)
Eosinophils Absolute: 0.3 10*3/uL (ref 0.0–0.7)
Eosinophils Relative: 3 %
HEMATOCRIT: 23.9 % — AB (ref 36.0–46.0)
Hemoglobin: 8.1 g/dL — ABNORMAL LOW (ref 12.0–15.0)
Lymphocytes Relative: 16 %
Lymphs Abs: 1.3 10*3/uL (ref 0.7–4.0)
MCH: 30.2 pg (ref 26.0–34.0)
MCHC: 33.9 g/dL (ref 30.0–36.0)
MCV: 89.2 fL (ref 78.0–100.0)
MONO ABS: 0.7 10*3/uL (ref 0.1–1.0)
MONOS PCT: 8 %
NEUTROS ABS: 6 10*3/uL (ref 1.7–7.7)
Neutrophils Relative %: 73 %
Platelets: 59 10*3/uL — ABNORMAL LOW (ref 150–400)
RBC: 2.68 MIL/uL — ABNORMAL LOW (ref 3.87–5.11)
RDW: 16.3 % — AB (ref 11.5–15.5)
WBC: 8.2 10*3/uL (ref 4.0–10.5)

## 2017-02-09 LAB — PREPARE PLATELET PHERESIS
UNIT DIVISION: 0
UNIT DIVISION: 0
Unit division: 0

## 2017-02-09 LAB — GLUCOSE, CAPILLARY
GLUCOSE-CAPILLARY: 142 mg/dL — AB (ref 65–99)
GLUCOSE-CAPILLARY: 143 mg/dL — AB (ref 65–99)
GLUCOSE-CAPILLARY: 149 mg/dL — AB (ref 65–99)
GLUCOSE-CAPILLARY: 161 mg/dL — AB (ref 65–99)
Glucose-Capillary: 128 mg/dL — ABNORMAL HIGH (ref 65–99)
Glucose-Capillary: 113 mg/dL — ABNORMAL HIGH (ref 65–99)
Glucose-Capillary: 161 mg/dL — ABNORMAL HIGH (ref 65–99)

## 2017-02-09 LAB — BPAM PLATELET PHERESIS
Blood Product Expiration Date: 201809142359
Blood Product Expiration Date: 201809142359
Blood Product Expiration Date: 201809152359
ISSUE DATE / TIME: 201809141021
ISSUE DATE / TIME: 201809141208
UNIT TYPE AND RH: 5100
Unit Type and Rh: 5100
Unit Type and Rh: 8400

## 2017-02-09 LAB — CBC
HCT: 27.2 % — ABNORMAL LOW (ref 36.0–46.0)
Hemoglobin: 9 g/dL — ABNORMAL LOW (ref 12.0–15.0)
MCH: 30 pg (ref 26.0–34.0)
MCHC: 33.1 g/dL (ref 30.0–36.0)
MCV: 90.7 fL (ref 78.0–100.0)
Platelets: 69 10*3/uL — ABNORMAL LOW (ref 150–400)
RBC: 3 MIL/uL — ABNORMAL LOW (ref 3.87–5.11)
RDW: 16.4 % — ABNORMAL HIGH (ref 11.5–15.5)
WBC: 10.5 10*3/uL (ref 4.0–10.5)

## 2017-02-09 LAB — MAGNESIUM: Magnesium: 1.9 mg/dL (ref 1.7–2.4)

## 2017-02-09 NOTE — Progress Notes (Signed)
Subjective: Interval History: none.. Sitting up in the chair comfortable.  Objective: Vital signs in last 24 hours: Temp:  [97.7 F (36.5 C)-100.1 F (37.8 C)] 100.1 F (37.8 C) (09/15 0759) Pulse Rate:  [60-90] 65 (09/15 0630) Resp:  [14-26] 15 (09/15 0630) BP: (95-165)/(38-72) 115/66 (09/15 0949) SpO2:  [93 %-100 %] 97 % (09/15 0630) Weight:  [280 lb (127 kg)] 280 lb (127 kg) (09/14 1042)  Intake/Output from previous day: 09/14 0701 - 09/15 0700 In: 2896.3 [P.O.:1342; I.V.:300; Blood:1254.3] Out: 4880 [Urine:2075; Drains:2505; Blood:300] Intake/Output this shift: No intake/output data recorded.  Unable to assess groin with her sitting in chair with her obesity and pannus. 2+ left dorsalis pedis pulse.  Lab Results:  Recent Labs  02/08/17 1343 02/09/17 0204  WBC 9.3 8.2  HGB 9.0* 8.1*  HCT 26.4* 23.9*  PLT 60* 59*   BMET  Recent Labs  02/08/17 0437 02/09/17 0204  NA 136 138  K 3.4* 3.8  CL 107 108  CO2 26 26  GLUCOSE 170* 123*  BUN 47* 40*  CREATININE 2.48* 1.91*  CALCIUM 7.9* 8.0*    Studies/Results: Dg Chest 2 View  Result Date: 02/01/2017 CLINICAL DATA:  Aortic stenosis, preoperative evaluation EXAM: CHEST  2 VIEW COMPARISON:  01/14/2009, 01/10/2017 FINDINGS: Stable cardiomegaly with mild vascular congestion. Aorta is atherosclerotic. Mild hyperinflation without focal pneumonia, collapse or consolidation. Negative for edema, effusion or pneumothorax. Trachea is midline. Degenerative spondylosis of the spine. Stable exam. IMPRESSION: Stable chest exam.  No interval change or acute process Electronically Signed   By: Jerilynn Mages.  Shick M.D.   On: 02/01/2017 12:49   Dg Abd 1 View  Result Date: 02/05/2017 CLINICAL DATA:  Postop. Evaluate for foreign body. Evacuation of retroperitoneal hematoma. EXAM: ABDOMEN - 1 VIEW COMPARISON:  CT 01/10/2017 FINDINGS: Surgical clip noted lateral to the acetabulum. No additional radiopaque foreign body. Nonobstructive bowel gas  pattern. Mild degenerative changes in the hips. No acute bony abnormality. IMPRESSION: No visible unexpected radiopaque foreign body. Electronically Signed   By: Rolm Baptise M.D.   On: 02/05/2017 15:10   Ct Coronary Morph W/cta Cor W/score W/ca W/cm &/or Wo/cm  Addendum Date: 01/10/2017   ADDENDUM REPORT: 01/10/2017 12:22 CLINICAL DATA:  Aortic Stenosis EXAM: Cardiac TAVR CT TECHNIQUE: The patient was scanned on a Philips 161 slice scanner . A 120 kV retrospective scan was triggered in the ascending thoracic aorta at 140 HU's. Gantry rotation speed was 250 msecs and collimation was .6 mm. No beta blockade or nitro were given. The 3D data set was reconstructed in 5% intervals of the R-R cycle. Systolic and diastolic phases were analyzed on a dedicated work station using MPR, MIP and VRT modes. The patient received 80 cc of contrast. FINDINGS: Aortic Valve: Tri leaflet and calcified with restricted leaflet motion Aorta: Mild calcific atherosclerotic debris. No aneurysm normal arch vessel origin Sinotubular Junction:  25.4 mm Ascending Thoracic Aorta:  33 mm Aortic Arch:  26 mm Descending Thoracic Aorta:  26 mm Sinus of Valsalva Measurements: Non-coronary:  26 mm Right -coronary:  24.5 mm Left -coronary:  24.5 mm Coronary Artery Height above Annulus: Left Main:  11.4 mm above annulus Right Coronary:  11.2 mm above annulus Virtual Basal Annulus Measurements: Maximum/Minimum Diameter:  21.8 mm x 25.8 mm Perimeter:  75 mm Area:  437 mm2 Coronary Arteries:  Sufficient height above annulus for deployment Optimum Fluoroscopic Angle for Delivery: LAO 29 degrees Caudal 14 degrees IMPRESSION: 1) Calcified tri leaflet aortic valve with annulus 437  mm2 suitable for a 26 mm Sapien 2 valve 2) Optimum angle for deployment LAO 29 degrees Caudal 14 degrees 3) Normal aortic root 33 mm 4) Coronary arteries sufficient height above annulus for deployment [place] Electronically Signed   By: Jenkins Rouge M.D.   On: 01/10/2017  12:22   Result Date: 01/10/2017 EXAM: OVER-READ INTERPRETATION  CT CHEST The following report is an over-read performed by radiologist Dr. Rebekah Chesterfield [facility] on 01/10/2017. This over-read does not include interpretation of cardiac or coronary anatomy or pathology. The coronary calcium score/coronary CTA interpretation by the cardiologist is attached. COMPARISON:  Chest CT 01/06/2008. FINDINGS: Extracardiac findings will be described separately under report for contemporaneously obtained CTA of the chest, abdomen and pelvis. IMPRESSION: Please see separate dictation for contemporaneously obtained CTA of the chest, abdomen and pelvis 01/10/2017 for full description of extracardiac findings. Electronically Signed: By: Vinnie Langton M.D. On: 01/10/2017 12:13   Dg Chest Port 1 View  Result Date: 02/07/2017 CLINICAL DATA:  Status post transcatheter aortic valve replacement on February 05, 2017 EXAM: PORTABLE CHEST 1 VIEW COMPARISON:  Portable chest x-ray of February 06, 2017 FINDINGS: The lungs are well-expanded. Bibasilar densities persist. There is no pneumothorax or significant pleural effusion. There is subsegmental atelectasis in the left lower lung. The cardiac silhouette is top-normal in size. The pulmonary vascularity is less congested. There is calcification in the wall of the aortic arch. The right internal jugular venous catheter tip projects over the midportion of the SVC. IMPRESSION: Improving CHF. Bibasilar subsegmental atelectasis. No significant pleural effusion. Thoracic aortic athero scleroses. Electronically Signed   By: David  Martinique M.D.   On: 02/07/2017 07:33   Dg Chest Port 1 View  Result Date: 02/06/2017 CLINICAL DATA:  Intubated patient, status post aortic valve replacement yesterday. EXAM: PORTABLE CHEST 1 VIEW COMPARISON:  Portable chest x-ray of February 05, 2017 FINDINGS: The lungs are well-expanded. The interstitial markings are less conspicuous. The  central pulmonary vascularity remains prominent. There is a trace of pleural fluid on the left. The cardiac silhouette is enlarged. The prosthetic aortic valve and aortic stent appear stable. The endotracheal tube tip projects 3.2 cm above the carina. The esophagogastric tube tip projects below the inferior margin of the image. The right internal jugular venous catheter tip projects over the midportion of the SVC. IMPRESSION: Decreased pulmonary interstitial edema and pulmonary vascular congestion. Tiny left pleural effusion. The support tubes are in stable position. Thoracic aortic atherosclerosis. Electronically Signed   By: David  Martinique M.D.   On: 02/06/2017 07:40   Dg Chest Port 1 View  Result Date: 02/05/2017 CLINICAL DATA:  Postop for TAVR. EXAM: PORTABLE CHEST 1 VIEW COMPARISON:  Not 2018, earlier the same day FINDINGS: 1551 hours. Leftward patient rotation. Endotracheal tube tip is 8 mm above the base of the carina and could be retracted 10-15 mm for more appropriate positioning. NG tube appears to be looped upon itself with the tip positioned in the distal esophagus and directed cranially. Left IJ sheath is evident. A right IJ central line tip projects over the expected location of the mid SVC. Cardiopericardial silhouette is enlarged. There is vascular congestion with bibasilar atelectasis. Bones are diffusely demineralized. Telemetry leads overlie the chest. IMPRESSION: 1. Endotracheal tube tip is 8 mm above the base of the carina. 2. NG tube tip is looped upon itself with the tip directed cranially and positioned in the distal esophagus. 3. Cardiomegaly with vascular congestion and bibasilar atelectasis. I discussed these  findings by telephone with the patient's nurse, Altha Harm , at 1610 hours on 02/05/2017. Electronically Signed   By: Misty Stanley M.D.   On: 02/05/2017 16:11   Dg Chest Port 1 View  Result Date: 02/05/2017 CLINICAL DATA:  Status post TAVR. EXAM: PORTABLE CHEST 1 VIEW  COMPARISON:  Chest x-ray dated February 01, 2017. FINDINGS: Interval TAVR. Right internal jugular central venous catheter with the tip projecting over the distal SVC. Stable cardiomegaly. Mild pulmonary vascular congestion. Atherosclerotic calcification of the aortic arch. Bibasilar atelectasis. No pneumothorax or pleural effusion. No acute osseous abnormality. IMPRESSION: Interval TAVR. Mild pulmonary vascular congestion with bibasilar atelectasis. Electronically Signed   By: Titus Dubin M.D.   On: 02/05/2017 10:56   Dg Abd Portable 1v  Result Date: 02/05/2017 CLINICAL DATA:  Encounter for orogastric tube placement EXAM: PORTABLE ABDOMEN - 1 VIEW COMPARISON:  02/05/2017 at 1444 hours FINDINGS: Targeted study of the left hemiabdomen from orogastric tube position. The tip and side port of a gastric tube are identified in the left upper quadrant of the abdomen in the expected location of the proximal stomach. A vascular stent projects over the included heart. Blunting the left costophrenic angle is seen. Surgical clips project over the left breast shadow. IMPRESSION: Gastric tube tip and side-port are in the expected location the stomach. Electronically Signed   By: Ashley Royalty M.D.   On: 02/05/2017 20:12   Ct Angio Chest Aorta W/cm &/or Wo/cm  Result Date: 01/10/2017 CLINICAL DATA:  81 year old female with history of severe aortic stenosis. Preprocedural study prior to potential transcatheter aortic valve replacement (TAVR) procedure. EXAM: CT ANGIOGRAPHY CHEST, ABDOMEN AND PELVIS TECHNIQUE: Multidetector CT imaging through the chest, abdomen and pelvis was performed using the standard protocol during bolus administration of intravenous contrast. Multiplanar reconstructed images and MIPs were obtained and reviewed to evaluate the vascular anatomy. CONTRAST:  120 mL of Isovue 370. COMPARISON:  CT the abdomen and pelvis 12/18/2016. Chest CT 01/06/2008. FINDINGS: CTA CHEST FINDINGS Cardiovascular: Heart size  is mildly enlarged. There is no significant pericardial fluid, thickening or pericardial calcification. There is aortic atherosclerosis, as well as atherosclerosis of the great vessels of the mediastinum and the coronary arteries, including calcified atherosclerotic plaque in the left anterior descending, left circumflex and right coronary arteries. Severe calcifications of the aortic valve. Moderate calcifications of the mitral annulus. Mediastinum/Lymph Nodes: No pathologically enlarged mediastinal or hilar lymph nodes. Esophagus is unremarkable in appearance. No axillary lymphadenopathy. Lungs/Pleura: There is a background of patchy ground-glass attenuation scattered throughout both lungs with some associated mild interlobular septal thickening, the appearance of which is most suggestive of a background of mild interstitial pulmonary edema. No confluent consolidative airspace disease. No pleural effusions. 4 mm pulmonary nodule in the right middle lobe (axial image 41 of series 16), stable in retrospect compared to prior study from 2009, considered definitively benign. No other larger more suspicious appearing pulmonary nodules or masses are noted elsewhere in the lungs. Peripheral architectural distortion and subpleural reticulation in the left upper lobe deep to the left breast, presumably from prior radiation therapy. 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 There are no aggressive appearing lytic or blastic lesions noted in the visualized portions of the skeleton. CTA ABDOMEN AND PELVIS FINDINGS Hepatobiliary: Liver has a slightly shrunken and nodular  contour, suggesting underlying cirrhosis. No discrete cystic or solid hepatic lesions.  Small calcified granuloma in the central aspect of the liver. No intra or extrahepatic biliary ductal dilatation. Status post cholecystectomy. Pancreas: No pancreatic mass. No pancreatic ductal dilatation. No pancreatic or peripancreatic fluid or inflammatory changes. Spleen: Spleen is mildly enlarged measuring 13.5 x 4.9 x 13.4 cm (estimated splenic volume of 443 mL). Adrenals/Urinary Tract: 1.8 cm low-attenuation lesion in the anterior aspect of the upper pole of the right kidney is compatible with a simple cyst. Several other subcentimeter low-attenuation lesions are noted in both kidneys, too small to definitively characterize, but statistically likely to represent tiny cysts. Bilateral adrenal glands are normal in appearance. No hydroureteronephrosis. Urinary bladder is normal in appearance. Stomach/Bowel: The appearance of the stomach is normal. There is no pathologic dilatation of small bowel or colon. The appendix is not confidently identified and may be surgically absent. Regardless, there are no inflammatory changes noted adjacent to the cecum to suggest the presence of an acute appendicitis at this time. Vascular/Lymphatic: Aortic atherosclerosis, with vascular findings and measurements pertinent to potential TAVR procedure, as detailed below. No aneurysm or dissection noted in the abdominal or pelvic vasculature. The celiac axis, superior mesenteric artery and inferior mesenteric artery and their major branches are all widely patent without hemodynamically significant stenosis. Two renal arteries are noted for both kidneys, all of which appear patent without hemodynamically significant stenosis. No lymphadenopathy identified in the abdomen or pelvis. Reproductive: Uterus and ovaries are unremarkable in appearance. Other: No significant volume of ascites.  No pneumoperitoneum. Musculoskeletal: There are no aggressive appearing lytic or blastic lesions noted in the visualized portions of the skeleton.  VASCULAR MEASUREMENTS PERTINENT TO TAVR: AORTA: Minimal Aortic Diameter -  16 x 15 mm Severity of Aortic Calcification -  mild RIGHT PELVIS: Right Common Iliac Artery - Minimal Diameter - 10.8 x 10.1 mm Tortuosity - mild Calcification - mild Right External Iliac Artery - Minimal Diameter - 7.1 x 7.8 mm Tortuosity - moderate Calcification - none Right Common Femoral Artery - Minimal Diameter - 8.2 x 7.8 mm Tortuosity - mild Calcification - none LEFT PELVIS: Left Common Iliac Artery - Minimal Diameter - 11.1 x 9.5 mm Tortuosity - mild Calcification - mild Left External Iliac Artery - Minimal Diameter - 8.2 x 7.7 mm Tortuosity - moderate Calcification - none Left Common Femoral Artery - Minimal Diameter - 7.9 x 8.3 mm Tortuosity - mild Calcification - minimal Review of the MIP images confirms the above findings. IMPRESSION: 1. Vascular findings and measurements pertinent to potential TAVR procedure, as detailed above. This patient does appear to have suitable pelvic arterial access bilaterally. 2. Severe thickening calcification of the aortic valve, compatible with the reported clinical history of severe aortic stenosis. 3. Cardiomegaly with evidence of mild interstitial pulmonary edema, suggesting a background of mild congestive heart failure. 4. Postoperative changes and areas of architectural distortion in the left breast. The largest of these areas measures 4.9 x 3.7 cm and demonstrates some internal and peripheral calcifications. While this is favored to represent a chronic postoperative seroma, correlation with nonemergent mammography is recommended. 5. Morphologic changes in the liver suggestive of underlying cirrhosis. There is also mild splenomegaly. 6. Additional incidental findings, as above. Aortic Atherosclerosis (ICD10-I70.0). Electronically Signed   By: Vinnie Langton M.D.   On: 01/10/2017 13:23   Ct Angio Abd/pel W/ And/or W/o  Result Date: 01/10/2017 CLINICAL DATA:  81 year old female with  history of severe aortic stenosis. Preprocedural study  prior to potential transcatheter aortic valve replacement (TAVR) procedure. EXAM: CT ANGIOGRAPHY CHEST, ABDOMEN AND PELVIS TECHNIQUE: Multidetector CT imaging through the chest, abdomen and pelvis was performed using the standard protocol during bolus administration of intravenous contrast. Multiplanar reconstructed images and MIPs were obtained and reviewed to evaluate the vascular anatomy. CONTRAST:  120 mL of Isovue 370. COMPARISON:  CT the abdomen and pelvis 12/18/2016. Chest CT 01/06/2008. FINDINGS: CTA CHEST FINDINGS Cardiovascular: Heart size is mildly enlarged. There is no significant pericardial fluid, thickening or pericardial calcification. There is aortic atherosclerosis, as well as atherosclerosis of the great vessels of the mediastinum and the coronary arteries, including calcified atherosclerotic plaque in the left anterior descending, left circumflex and right coronary arteries. Severe calcifications of the aortic valve. Moderate calcifications of the mitral annulus. Mediastinum/Lymph Nodes: No pathologically enlarged mediastinal or hilar lymph nodes. Esophagus is unremarkable in appearance. No axillary lymphadenopathy. Lungs/Pleura: There is a background of patchy ground-glass attenuation scattered throughout both lungs with some associated mild interlobular septal thickening, the appearance of which is most suggestive of a background of mild interstitial pulmonary edema. No confluent consolidative airspace disease. No pleural effusions. 4 mm pulmonary nodule in the right middle lobe (axial image 41 of series 16), stable in retrospect compared to prior study from 2009, considered definitively benign. No other larger more suspicious appearing pulmonary nodules or masses are noted elsewhere in the lungs. Peripheral architectural distortion and subpleural reticulation in the left upper lobe deep to the left breast, presumably from prior radiation  therapy. 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 There are no aggressive appearing lytic or blastic lesions noted in the visualized portions of the skeleton. CTA ABDOMEN AND PELVIS FINDINGS Hepatobiliary: Liver has a slightly shrunken and nodular contour, suggesting underlying cirrhosis. No discrete cystic or solid hepatic lesions. Small calcified granuloma in the central aspect of the liver. No intra or extrahepatic biliary ductal dilatation. Status post cholecystectomy. Pancreas: No pancreatic mass. No pancreatic ductal dilatation. No pancreatic or peripancreatic fluid or inflammatory changes. Spleen: Spleen is mildly enlarged measuring 13.5 x 4.9 x 13.4 cm (estimated splenic volume of 443 mL). Adrenals/Urinary Tract: 1.8 cm low-attenuation lesion in the anterior aspect of the upper pole of the right kidney is compatible with a simple cyst. Several other subcentimeter low-attenuation lesions are noted in both kidneys, too small to definitively characterize, but statistically likely to represent tiny cysts. Bilateral adrenal glands are normal in appearance. No hydroureteronephrosis. Urinary bladder is normal in appearance. Stomach/Bowel: The appearance of the stomach is normal. There is no pathologic dilatation of small bowel or colon. The appendix is not confidently identified and may be surgically absent. Regardless, there are no inflammatory changes noted adjacent to the cecum to suggest the presence of an acute appendicitis at this time. Vascular/Lymphatic: Aortic atherosclerosis, with vascular findings and measurements pertinent to potential TAVR procedure, as detailed below. No aneurysm or dissection noted in the abdominal or pelvic  vasculature. The celiac axis, superior mesenteric artery and inferior mesenteric artery and their major branches are all widely patent without hemodynamically significant stenosis. Two renal arteries are noted for both kidneys, all of which appear patent without hemodynamically significant stenosis. No lymphadenopathy identified in the abdomen or pelvis. Reproductive: Uterus and ovaries are unremarkable in appearance. Other: No significant volume of ascites.  No pneumoperitoneum. Musculoskeletal: There are no aggressive appearing lytic or blastic lesions noted in the visualized portions of the skeleton. VASCULAR MEASUREMENTS PERTINENT TO TAVR: AORTA: Minimal Aortic Diameter -  16 x 15 mm Severity of Aortic Calcification -  mild RIGHT PELVIS: Right Common Iliac Artery - Minimal Diameter - 10.8 x 10.1 mm Tortuosity - mild Calcification - mild Right External Iliac Artery - Minimal Diameter - 7.1 x 7.8 mm Tortuosity - moderate Calcification - none Right Common Femoral Artery - Minimal Diameter - 8.2 x 7.8 mm Tortuosity - mild Calcification - none LEFT PELVIS: Left Common Iliac Artery - Minimal Diameter - 11.1 x 9.5 mm Tortuosity - mild Calcification - mild Left External Iliac Artery - Minimal Diameter - 8.2 x 7.7 mm Tortuosity - moderate Calcification - none Left Common Femoral Artery - Minimal Diameter - 7.9 x 8.3 mm Tortuosity - mild Calcification - minimal Review of the MIP images confirms the above findings. IMPRESSION: 1. Vascular findings and measurements pertinent to potential TAVR procedure, as detailed above. This patient does appear to have suitable pelvic arterial access bilaterally. 2. Severe thickening calcification of the aortic valve, compatible with the reported clinical history of severe aortic stenosis. 3. Cardiomegaly with evidence of mild interstitial pulmonary edema, suggesting a background of mild congestive heart failure. 4. Postoperative changes and areas of architectural distortion in the left  breast. The largest of these areas measures 4.9 x 3.7 cm and demonstrates some internal and peripheral calcifications. While this is favored to represent a chronic postoperative seroma, correlation with nonemergent mammography is recommended. 5. Morphologic changes in the liver suggestive of underlying cirrhosis. There is also mild splenomegaly. 6. Additional incidental findings, as above. Aortic Atherosclerosis (ICD10-I70.0). Electronically Signed   By: Vinnie Langton M.D.   On: 01/10/2017 13:23   Anti-infectives: Anti-infectives    Start     Dose/Rate Route Frequency Ordered Stop   02/08/17 1015  ceFAZolin (ANCEF) IVPB 1 g/50 mL premix    Comments:  Send with pt to OR   1 g 100 mL/hr over 30 Minutes Intravenous On call 02/08/17 1010 02/08/17 1045   02/06/17 1800  cefUROXime (ZINACEF) 1.5 g in dextrose 5 % 50 mL IVPB     1.5 g 100 mL/hr over 30 Minutes Intravenous Every 12 hours 02/06/17 1005 02/07/17 0550   02/06/17 1015  cefUROXime (ZINACEF) 1.5 g in dextrose 5 % 50 mL IVPB  Status:  Discontinued     1.5 g 100 mL/hr over 30 Minutes Intravenous Every 12 hours 02/06/17 1001 02/06/17 1005   02/05/17 2300  vancomycin (VANCOCIN) IVPB 1000 mg/200 mL premix     1,000 mg 200 mL/hr over 60 Minutes Intravenous  Once 02/05/17 1516 02/06/17 0120   02/05/17 2000  cefUROXime (ZINACEF) 1.5 g in dextrose 5 % 50 mL IVPB  Status:  Discontinued     1.5 g 100 mL/hr over 30 Minutes Intravenous Every 12 hours 02/05/17 1032 02/05/17 1516   02/05/17 1900  cefUROXime (ZINACEF) 1.5 g in dextrose 5 % 50 mL IVPB  Status:  Discontinued     1.5 g 100 mL/hr over 30 Minutes Intravenous Every 12 hours 02/05/17 1516 02/06/17 1001   02/05/17 1800  vancomycin (VANCOCIN) IVPB 1000 mg/200 mL premix  Status:  Discontinued     1,000 mg 200 mL/hr over 60 Minutes Intravenous  Once 02/05/17 1032 02/05/17 1516   02/05/17 0400  vancomycin (VANCOCIN) 1,250 mg in sodium chloride 0.9 % 250 mL IVPB  Status:  Discontinued  1,250  mg 166.7 mL/hr over 90 Minutes Intravenous To Surgery 02/04/17 1352 02/05/17 1032   02/05/17 0400  cefUROXime (ZINACEF) 1.5 g in dextrose 5 % 50 mL IVPB     1.5 g 100 mL/hr over 30 Minutes Intravenous To Surgery 02/04/17 1352 02/05/17 0811      Assessment/Plan: s/p Procedure(s): EVACUATION HEMATOMA (Left) Stable following repair of her left femoral artery and then return the operating room for evacuation of a large hematoma. She continues to have a large output from her drain which is not surprising. She had a huge hematoma defect which had refilled with blood. Does have decreased platelets and has had drop in her hematocrit to 23-26. Remains hemodynamically stable. Would consider transfusion.   LOS: 4 days   Curt Jews 02/09/2017, 10:17 AM

## 2017-02-09 NOTE — Progress Notes (Signed)
[place] VALVE TEAM   Patient Name: Carrie Monroe Date of Encounter: 02/09/2017  Primary Cardiologist: Dr. [facility] Problem List     Active Problems:   S/P TAVR (transcatheter aortic valve replacement)   Acute respiratory failure with hypoxia (HCC)   Hemorrhagic shock [facility])   Patient Profile     81 y.o. female with history of chronic diastolic CHF, HTN, HLD, DM, gout, breast cancer, CAD, severe aortic stenosis and severe carotid artery disease who was admitted following TAVR on 02/05/17. Following the case she had an active bleed from her left groin that we were unable to control with manual hemostasis. She had a significant blood loss with shock and was taken to the OR for femoral artery repair;  She needed a second surgery 9/14>> no active bleeding "but massive cavity where the large hematoma had been. I could literally get entire fist and forearm into her cavity that extended from her groin extraperitoneally to the left back towards her buttocks." (TE)   Subjective  Feels much better   Less leg pain and no sob or chest pain   Inpatient Medications    Scheduled Meds: . acetaminophen  1,000 mg Oral Q6H   Or  . acetaminophen (TYLENOL) oral liquid 160 mg/5 mL  1,000 mg Per Tube Q6H  . chlorhexidine gluconate (MEDLINE KIT)  15 mL Mouth Rinse BID  . Chlorhexidine Gluconate Cloth  6 each Topical Daily  . insulin aspart  0-24 Units Subcutaneous Q4H  . insulin detemir  28 Units Subcutaneous BID  . mouth rinse  15 mL Mouth Rinse QID  . metoprolol tartrate  25 mg Oral BID  . rosuvastatin  5 mg Oral q1800  . sodium chloride flush  3 mL Intravenous Q12H   Continuous Infusions: . sodium chloride    . dexmedetomidine (PRECEDEX) IV infusion Stopped (02/06/17 1000)  . insulin (NOVOLIN-R) infusion Stopped (02/06/17 1400)  . lactated ringers    . norepinephrine (LEVOPHED) Adult infusion Stopped (02/07/17 0442)   PRN  Meds: docusate sodium, fentaNYL (SUBLIMAZE) injection, lactated ringers, metoprolol tartrate, ondansetron (ZOFRAN) IV, oxyCODONE-acetaminophen, sodium chloride flush, sodium chloride flush   Vital Signs    Vitals:   02/09/17 0530 02/09/17 0600 02/09/17 0630 02/09/17 0759  BP: (!) 128/51 (!) 139/58 (!) 137/54   Pulse: 63 66 65   Resp: 19 16 15    Temp:    100.1 F (37.8 C)  TempSrc:    Oral  SpO2: 97% 97% 97%   Weight:      Height:        Intake/Output Summary (Last 24 hours) at 02/09/17 0829 Last data filed at 02/09/17 0630  Gross per 24 hour  Intake          2852.25 ml  Output             4880 ml  Net         -2027.75 ml   Filed Weights   02/07/17 0442 02/08/17 0500 02/08/17 1042  Weight: 285 lb 11.5 oz (129.6 kg) 280 lb 3.3 oz (127.1 kg) 280 lb (127 kg)    Physical Exam   Well developed and Morbidly obese in no acute distress HENT normal Neck supple with JVP-flat Carotids brisk and full without bruits Clear Regular rate and rhythm 2/6 m Abd-soft with active BS without hepatomegaly No Clubbing cyanosis 1+ edema Skin-warm and dry A & Oriented  Grossly normal sensory and motor function t.  Labs  CBC  Recent Labs  02/08/17 0437  02/08/17 1343 02/09/17 0204  WBC 8.7  --  9.3 8.2  NEUTROABS 6.8  --   --  6.0  HGB 6.5*  --  9.0* 8.1*  HCT 19.3*  --  26.4* 23.9*  MCV 90.2  --  88.3 89.2  PLT 42*  < > 60* 59*  < > = values in this interval not displayed. Basic Metabolic Panel  Recent Labs  02/08/17 0437 02/09/17 0204  NA 136 138  K 3.4* 3.8  CL 107 108  CO2 26 26  GLUCOSE 170* 123*  BUN 47* 40*  CREATININE 2.48* 1.91*  CALCIUM 7.9* 8.0*  MG 2.0 1.9  PHOS 4.1 3.0   Liver Function Tests  Recent Labs  02/08/17 0437 02/09/17 0204  ALBUMIN 1.9* 2.1*   No results for input(s): LIPASE, AMYLASE in the last 72 hours. Cardiac Enzymes No results for input(s): CKTOTAL, CKMB, CKMBINDEX, TROPONINI in the last 72 hours. BNP Invalid input(s):  POCBNP D-Dimer  Recent Labs  02/08/17 1241  DDIMER 3.34*   Hemoglobin A1C No results for input(s): HGBA1C in the last 72 hours. Fasting Lipid Panel No results for input(s): CHOL, HDL, LDLCALC, TRIG, CHOLHDL, LDLDIRECT in the last 72 hours. Thyroid Function Tests No results for input(s): TSH, T4TOTAL, T3FREE, THYROIDAB in the last 72 hours.  Invalid input(s): FREET3  Telemetry    NSR - Personally Reviewed  ECG    NSR, LBBB - Personally Reviewed  Radiology    No results found.  Cardiac Studies   2D ECHO: 02/06/2017 LV EF: 50% -55% Study Conclusions - Left ventricle: The cavity size was normal. There was mild   concentric hypertrophy. Systolic function was normal. The   estimated ejection fraction was in the range of 50% to 55%. Wall   motion was normal; there were no regional wall motion   abnormalities. Doppler parameters are consistent with abnormal   left ventricular relaxation (grade 1 diastolic dysfunction). - Ventricular septum: Septal motion showed abnormal function and   dyssynergy. - Aortic valve: Medtronic CoreValve Evolut Pro (size 20m) was   functioning normally. Peak velocity (S): 156 cm/s. Mean gradient   (S): 5 mm Hg. Valve area (Vmax): 1.38 cm^2. - Mitral valve: Moderately calcified annulus. - Pericardium, extracardiac: There was a left pleural effusion.    Assessment & Plan    Severe AS: s/p successful TAVR with a  26 mm Medtronic CoreValve Evolut Pro via L TF approach on 02/05/17. Post operative echo 02/06/17 showed good valve placement with no PVL. ECG with new LBBB but no bradyarrhythmia. ASA and Plavix initially held with significant bleeding, then resumed yesterday. Hg dropped to 6.5 last night and she is receiving more PRBCs. Will hold these until her Hg stabilizes. ( we would like to resume this as soon as possible given recent stenting and TAVR).   Hemorrhagic Shock - required pressors   Groin bleed:  Hg >>> 6.5>>Tx >>8.1    Thrombocytopenia: PLT 42. Received FFP and Plts >>59   Acute on chronic diastolic CHF: up 30 lbs from pre op. She was placed on IV Lasix 460mBID by CHF team. Now lasix on hold with drop in Hg.  AKI: creatinine 1.7 -> 2.36--> 2.48.>>1.91 improving   CAD: she underwent DES to mLAD, PCTA to oDiag, and DES to LCx on 12/28/16. Plan to resume DAPT with ASA/plavix as soon as Hg stabilizes.   Carotid artery disease: she will require R CEA after she recovers from  TAVR    Much improved  Continuing to diurese on her own   Supportive care from surgery       Virl Axe 02/09/2017 8:29 AM

## 2017-02-09 NOTE — Progress Notes (Signed)
1 Day Post-Op Procedure(s) (LRB): EVACUATION HEMATOMA (Left) Subjective: Serosanguinous output from retroperitoneal drain Hb 8.1 , BP 110 mm Hg Up in chair Groin dressing dry  Objective: Vital signs in last 24 hours: Temp:  [97.7 F (36.5 C)-100.1 F (37.8 C)] 100.1 F (37.8 C) (09/15 0759) Pulse Rate:  [60-90] 65 (09/15 0630) Cardiac Rhythm: Normal sinus rhythm (09/15 0400) Resp:  [14-26] 15 (09/15 0630) BP: (95-165)/(38-76) 115/66 (09/15 0949) SpO2:  [93 %-100 %] 97 % (09/15 0630) Weight:  [280 lb (127 kg)] 280 lb (127 kg) (09/14 1042)  Hemodynamic parameters for last 24 hours:  stable  Intake/Output from previous day: 09/14 0701 - 09/15 0700 In: 2896.3 [P.O.:1342; I.V.:300; Blood:1254.3] Out: 4880 [Urine:2075; Drains:2505; Blood:300] Intake/Output this shift: No intake/output data recorded.       Exam    General- alert and comfortable   Lungs- clear without rales, wheezes   Cor- regular rate and rhythm, 1/6 SEM  murmur , gallop   Abdomen- soft, non-tender- drain in place   Extremities - warm, non-tender, minimal edema   Neuro- oriented, appropriate, no focal weakness   Lab Results:  Recent Labs  02/08/17 1343 02/09/17 0204  WBC 9.3 8.2  HGB 9.0* 8.1*  HCT 26.4* 23.9*  PLT 60* 59*   BMET:  Recent Labs  02/08/17 0437 02/09/17 0204  NA 136 138  K 3.4* 3.8  CL 107 108  CO2 26 26  GLUCOSE 170* 123*  BUN 47* 40*  CREATININE 2.48* 1.91*  CALCIUM 7.9* 8.0*    PT/INR:  Recent Labs  02/08/17 1241  LABPROT 14.3  INR 1.12   ABG    Component Value Date/Time   PHART 7.425 02/06/2017 0952   HCO3 24.8 02/06/2017 0952   TCO2 24 02/06/2017 1624   ACIDBASEDEF 4.0 (H) 02/05/2017 1545   O2SAT 99.0 02/06/2017 0952   CBG (last 3)   Recent Labs  02/09/17 0003 02/09/17 0359 02/09/17 0801  GLUCAP 161* 128* 113*    Assessment/Plan: S/P Procedure(s) (LRB): EVACUATION HEMATOMA (Left) Hb 9.0>>>8.1, will check this pm   LOS: 4 days    Tharon Aquas  Trigt III 02/09/2017

## 2017-02-09 NOTE — Progress Notes (Signed)
VVS rounding on the floor and updated on pt output from JP drain. No orders, will continue to monitor closely.

## 2017-02-09 NOTE — Plan of Care (Signed)
Problem: Pain Managment: Goal: General experience of comfort will improve Outcome: Progressing Patient reports no pain this shift.   Problem: Physical Regulation: Goal: Will remain free from infection Outcome: Progressing Daily CHG baths per protocol.  Problem: Fluid Volume: Goal: Ability to maintain a balanced intake and output will improve Outcome: Progressing Good urine output via foley catheter. JP drain putting out 100-200cc/hr, MDs are aware.   Problem: Bowel/Gastric: Goal: Will not experience complications related to bowel motility Outcome: Progressing Patient is passing gas, positive bowel sounds, but no BM since surgery.

## 2017-02-10 DIAGNOSIS — I447 Left bundle-branch block, unspecified: Secondary | ICD-10-CM

## 2017-02-10 LAB — GLUCOSE, CAPILLARY
GLUCOSE-CAPILLARY: 148 mg/dL — AB (ref 65–99)
GLUCOSE-CAPILLARY: 203 mg/dL — AB (ref 65–99)
Glucose-Capillary: 154 mg/dL — ABNORMAL HIGH (ref 65–99)
Glucose-Capillary: 180 mg/dL — ABNORMAL HIGH (ref 65–99)
Glucose-Capillary: 166 mg/dL — ABNORMAL HIGH (ref 65–99)
Glucose-Capillary: 240 mg/dL — ABNORMAL HIGH (ref 65–99)

## 2017-02-10 LAB — RENAL FUNCTION PANEL
Albumin: 2 g/dL — ABNORMAL LOW (ref 3.5–5.0)
Anion gap: 4 — ABNORMAL LOW (ref 5–15)
BUN: 40 mg/dL — ABNORMAL HIGH (ref 6–20)
CALCIUM: 8.1 mg/dL — AB (ref 8.9–10.3)
CHLORIDE: 106 mmol/L (ref 101–111)
CO2: 26 mmol/L (ref 22–32)
CREATININE: 1.75 mg/dL — AB (ref 0.44–1.00)
GFR calc Af Amer: 31 mL/min — ABNORMAL LOW (ref >60)
GFR calc non Af Amer: 27 mL/min — ABNORMAL LOW (ref >60)
GLUCOSE: 154 mg/dL — AB (ref 65–99)
Phosphorus: 2.3 mg/dL — ABNORMAL LOW (ref 2.5–4.6)
Potassium: 4.1 mmol/L (ref 3.5–5.1)
SODIUM: 136 mmol/L (ref 135–145)

## 2017-02-10 LAB — CBC WITH DIFFERENTIAL/PLATELET
BASOS PCT: 0 %
Basophils Absolute: 0 10*3/uL (ref 0.0–0.1)
EOS ABS: 0.3 10*3/uL (ref 0.0–0.7)
Eosinophils Relative: 3 %
HEMATOCRIT: 24.5 % — AB (ref 36.0–46.0)
HEMOGLOBIN: 7.8 g/dL — AB (ref 12.0–15.0)
LYMPHS ABS: 1.1 10*3/uL (ref 0.7–4.0)
Lymphocytes Relative: 11 %
MCH: 29.3 pg (ref 26.0–34.0)
MCHC: 31.8 g/dL (ref 30.0–36.0)
MCV: 92.1 fL (ref 78.0–100.0)
MONO ABS: 1.1 10*3/uL — AB (ref 0.1–1.0)
MONOS PCT: 12 %
Neutro Abs: 7.1 10*3/uL (ref 1.7–7.7)
Neutrophils Relative %: 74 %
Platelets: 67 10*3/uL — ABNORMAL LOW (ref 150–400)
RBC: 2.66 MIL/uL — ABNORMAL LOW (ref 3.87–5.11)
RDW: 16.1 % — AB (ref 11.5–15.5)
WBC: 9.6 10*3/uL (ref 4.0–10.5)

## 2017-02-10 LAB — MAGNESIUM: Magnesium: 2 mg/dL (ref 1.7–2.4)

## 2017-02-10 MED ORDER — FE FUMARATE-B12-VIT C-FA-IFC PO CAPS
1.0000 | ORAL_CAPSULE | Freq: Three times a day (TID) | ORAL | Status: DC
  Administered 2017-02-10 – 2017-02-27 (×49): 1 via ORAL
  Filled 2017-02-10 (×46): qty 1

## 2017-02-10 NOTE — Progress Notes (Signed)
[place] VALVE TEAM   Patient Name: Carrie Monroe Date of Encounter: 02/10/2017  Primary Cardiologist: Dr. [facility] Problem List     Active Problems:   S/P TAVR (transcatheter aortic valve replacement)   Acute respiratory failure with hypoxia (HCC)   Hemorrhagic shock [facility])   Patient Profile     81 y.o. female with history of chronic diastolic CHF, HTN, HLD, DM, gout, breast cancer, CAD, severe aortic stenosis and severe carotid artery disease who was admitted following TAVR on 02/05/17. Following the case she had an active bleed from her left groin that we were unable to control with manual hemostasis. She had a significant blood loss with shock and was taken to the OR for femoral artery repair;  She needed a second surgery 9/14>> no active bleeding "but massive cavity where the large hematoma had been. I could literally get entire fist and forearm into her cavity that extended from her groin extraperitoneally to the left back towards her buttocks." (TE)   Subjective   feells  Some better  Some leg pain on the L No chest pain or SOB Wants to get out of here but... :)  Inpatient Medications    Scheduled Meds: . acetaminophen  1,000 mg Oral Q6H   Or  . acetaminophen (TYLENOL) oral liquid 160 mg/5 mL  1,000 mg Per Tube Q6H  . chlorhexidine gluconate (MEDLINE KIT)  15 mL Mouth Rinse BID  . Chlorhexidine Gluconate Cloth  6 each Topical Daily  . insulin aspart  0-24 Units Subcutaneous Q4H  . insulin detemir  28 Units Subcutaneous BID  . mouth rinse  15 mL Mouth Rinse QID  . metoprolol tartrate  25 mg Oral BID  . rosuvastatin  5 mg Oral q1800  . sodium chloride flush  3 mL Intravenous Q12H   Continuous Infusions: . sodium chloride    . dexmedetomidine (PRECEDEX) IV infusion Stopped (02/06/17 1000)  . insulin (NOVOLIN-R) infusion Stopped (02/06/17 1400)  . lactated ringers    . norepinephrine (LEVOPHED) Adult  infusion Stopped (02/07/17 0442)   PRN Meds: docusate sodium, fentaNYL (SUBLIMAZE) injection, lactated ringers, metoprolol tartrate, ondansetron (ZOFRAN) IV, oxyCODONE-acetaminophen, sodium chloride flush, sodium chloride flush   Vital Signs    Vitals:   02/10/17 0400 02/10/17 0500 02/10/17 0600 02/10/17 0730  BP: (!) 142/50 (!) 138/43 (!) 128/48   Pulse: 81 73 69   Resp: _0 Temp:    99.1 F (37.3 C)  TempSrc:    Oral  SpO2: 97% 95% 93%   Weight:      Height:        Intake/Output Summary (Last 24 hours) at 02/10/17 0855 Last data filed at 02/10/17 0600  Gross per 24 hour  Intake             1680 ml  Output             3680 ml  Net            -2000 ml   Still large volume drain output    Filed Weights   02/08/17 0500 02/08/17 1042 02/10/17 0359  Weight: 280 lb 3.3 oz (127.1 kg) 280 lb (127 kg) 278 lb 3.5 oz (126.2 kg)    Physical Exam  Well developed and Morbidly obese  in no acute distress HENT normal Neck supple   Clear Regular rate and rhythm, 2/6  Abd-soft with active BS without hepatomegaly No Clubbing cyanosis  tr edema Skin-warm and dry A & Oriented  Grossly normal sensory and motor function   Labs    CBC  Recent Labs  02/09/17 0204 02/09/17 1603 02/10/17 0325  WBC 8.2 10.5 9.6  NEUTROABS 6.0  --  7.1  HGB 8.1* 9.0* 7.8*  HCT 23.9* 27.2* 24.5*  MCV 89.2 90.7 92.1  PLT 59* 69* 67*   Basic Metabolic Panel  Recent Labs  02/09/17 0204 02/10/17 0323  NA 138 136  K 3.8 4.1  CL 108 106  CO2 26 26  GLUCOSE 123* 154*  BUN 40* 40*  CREATININE 1.91* 1.75*  CALCIUM 8.0* 8.1*  MG 1.9 2.0  PHOS 3.0 2.3*   Liver Function Tests  Recent Labs  02/09/17 0204 02/10/17 0323  ALBUMIN 2.1* 2.0*   No results for input(s): LIPASE, AMYLASE in the last 72 hours. Cardiac Enzymes No results for input(s): CKTOTAL, CKMB, CKMBINDEX, TROPONINI in the last 72 hours. BNP Invalid input(s): POCBNP D-Dimer  Recent Labs  02/08/17 1241  DDIMER  3.34*   Hemoglobin A1C No results for input(s): HGBA1C in the last 72 hours. Fasting Lipid Panel No results for input(s): CHOL, HDL, LDLCALC, TRIG, CHOLHDL, LDLDIRECT in the last 72 hours. Thyroid Function Tests No results for input(s): TSH, T4TOTAL, T3FREE, THYROIDAB in the last 72 hours.  Invalid input(s): FREET3  Telemetry    NSR - Personally Reviewed  ECG    NSR, LBBB - Personally Reviewed  Radiology    No results found.  Cardiac Studies   2D ECHO: 02/06/2017 LV EF: 50% -55% Study Conclusions - Left ventricle: The cavity size was normal. There was mild   concentric hypertrophy. Systolic function was normal. The   estimated ejection fraction was in the range of 50% to 55%. Wall   motion was normal; there were no regional wall motion   abnormalities. Doppler parameters are consistent with abnormal   left ventricular relaxation (grade 1 diastolic dysfunction). - Ventricular septum: Septal motion showed abnormal function and   dyssynergy. - Aortic valve: Medtronic CoreValve Evolut Pro (size 41m) was   functioning normally. Peak velocity (S): 156 cm/s. Mean gradient   (S): 5 mm Hg. Valve area (Vmax): 1.38 cm^2. - Mitral valve: Moderately calcified annulus. - Pericardium, extracardiac: There was a left pleural effusion.    Assessment & Plan    AS:   s/p successful TAVR with a  26 mm Medtronic CoreValve Evolut Pro via L TF approach on 02/05/17. Post operative echo 02/06/17 showed good valve placement with no PVL.      Hemorrhagic Shock - required pressors now off  Groin bleed:  Hg >>> 6.5>>Tx >>8.1 >>7.4  Vascular following after surgical repair  Thrombocytopenia: PLT 42. Received FFP and Plts >>59>> 67   Acute on chronic diastolic CHF: up 30 lbs from pre op. She was placed on IV Lasix 422mBID by CHF team. Now lasix on hold with drop in Hg.  AKI: creatinine 1.7 -> 2.36--> 2.48.>>1.91>> 1.75 Continuing to improve   CAD: she underwent DES to mLAD, PCTA to oDiag,  and DES to LCx on 12/28/16. Plan to resume DAPT with ASA/plavix as soon as Hg stabilizes.   Carotid artery disease: she will require R CEA after she recovers from TAVR   LBBB  New post TAVR  Will recheck ECG   There is a whole literature emerging on new post TAVR LBBB    Not yet back on antiplatlet therapy  Await OK from surgery    StVirl Axe  02/10/2017 8:55 AM

## 2017-02-10 NOTE — Progress Notes (Addendum)
2 Days Post-Op Procedure(s) (LRB): EVACUATION HEMATOMA (Left) Subjective: Groin hematoma after TAVR Ambulating in hall Hb 8, 2L serosanguinous drainage from [facility] drain  Objective: Vital signs in last 24 hours: Temp:  [98.5 F (36.9 C)-100.3 F (37.9 C)] 98.5 F (36.9 C) (09/16 1100) Pulse Rate:  [61-90] 69 (09/16 0600) Cardiac Rhythm: Normal sinus rhythm (09/16 0400) Resp:  [13-21] 18 (09/16 0600) BP: (93-154)/(37-93) 124/48 (09/16 0952) SpO2:  [91 %-100 %] 93 % (09/16 0600) Weight:  [278 lb 3.5 oz (126.2 kg)] 278 lb 3.5 oz (126.2 kg) (09/16 0359)  Hemodynamic parameters for last 24 hours:  BP ok  Intake/Output from previous day: 09/15 0701 - 09/16 0700 In: 1800 [P.O.:1800] Out: 7672 [Urine:1780; Drains:1975] Intake/Output this shift: No intake/output data recorded.    Lab Results:  Recent Labs  02/09/17 1603 02/10/17 0325  WBC 10.5 9.6  HGB 9.0* 7.8*  HCT 27.2* 24.5*  PLT 69* 67*   BMET:  Recent Labs  02/09/17 0204 02/10/17 0323  NA 138 136  K 3.8 4.1  CL 108 106  CO2 26 26  GLUCOSE 123* 154*  BUN 40* 40*  CREATININE 1.91* 1.75*  CALCIUM 8.0* 8.1*    PT/INR:  Recent Labs  02/08/17 1241  LABPROT 14.3  INR 1.12   ABG    Component Value Date/Time   PHART 7.425 02/06/2017 0952   HCO3 24.8 02/06/2017 0952   TCO2 24 02/06/2017 1624   ACIDBASEDEF 4.0 (H) 02/05/2017 1545   O2SAT 99.0 02/06/2017 0952   CBG (last 3)   Recent Labs  02/10/17 0401 02/10/17 0740 02/10/17 1130  GLUCAP 148* 154* 180*    Assessment/Plan: S/P Procedure(s) (LRB): EVACUATION HEMATOMA (Left) Follow daily Hct Start po Iron, vitamin Hold anti- plt therapy  LOS: 5 days    Carrie Monroe 02/10/2017

## 2017-02-10 NOTE — Progress Notes (Signed)
Subjective: Interval History: none.. Looks good this morning. Just got up to the chair. Reports less discomfort in her left groin.  Objective: Vital signs in last 24 hours: Temp:  [99.4 F (37.4 C)-100.3 F (37.9 C)] 100.3 F (37.9 C) (09/16 0300) Pulse Rate:  [59-90] 81 (09/16 0400) Resp:  [13-33] 18 (09/16 0400) BP: (93-154)/(37-93) 142/50 (09/16 0400) SpO2:  [91 %-100 %] 97 % (09/16 0400) Weight:  [278 lb 3.5 oz (126.2 kg)] 278 lb 3.5 oz (126.2 kg) (09/16 0359)  Intake/Output from previous day: 09/15 0701 - 09/16 0700 In: 1800 [P.O.:1800] Out: 3430 [Urine:1605; Drains:1825] Intake/Output this shift: Total I/O In: -  Out: 1335 [Urine:660; Drains:675]  Groin dressing intact and had just been changed. 2+ left dorsalis pedis pulse.  Lab Results:  Recent Labs  02/09/17 1603 02/10/17 0325  WBC 10.5 9.6  HGB 9.0* 7.8*  HCT 27.2* 24.5*  PLT 69* 67*   BMET  Recent Labs  02/09/17 0204 02/10/17 0323  NA 138 136  K 3.8 4.1  CL 108 106  CO2 26 26  GLUCOSE 123* 154*  BUN 40* 40*  CREATININE 1.91* 1.75*  CALCIUM 8.0* 8.1*    Studies/Results: Dg Chest 2 View  Result Date: 02/01/2017 CLINICAL DATA:  Aortic stenosis, preoperative evaluation EXAM: CHEST  2 VIEW COMPARISON:  01/14/2009, 01/10/2017 FINDINGS: Stable cardiomegaly with mild vascular congestion. Aorta is atherosclerotic. Mild hyperinflation without focal pneumonia, collapse or consolidation. Negative for edema, effusion or pneumothorax. Trachea is midline. Degenerative spondylosis of the spine. Stable exam. IMPRESSION: Stable chest exam.  No interval change or acute process Electronically Signed   By: Jerilynn Mages.  Shick M.D.   On: 02/01/2017 12:49   Dg Abd 1 View  Result Date: 02/05/2017 CLINICAL DATA:  Postop. Evaluate for foreign body. Evacuation of retroperitoneal hematoma. EXAM: ABDOMEN - 1 VIEW COMPARISON:  CT 01/10/2017 FINDINGS: Surgical clip noted lateral to the acetabulum. No additional radiopaque foreign body.  Nonobstructive bowel gas pattern. Mild degenerative changes in the hips. No acute bony abnormality. IMPRESSION: No visible unexpected radiopaque foreign body. Electronically Signed   By: Rolm Baptise M.D.   On: 02/05/2017 15:10   Dg Chest Port 1 View  Result Date: 02/07/2017 CLINICAL DATA:  Status post transcatheter aortic valve replacement on February 05, 2017 EXAM: PORTABLE CHEST 1 VIEW COMPARISON:  Portable chest x-ray of February 06, 2017 FINDINGS: The lungs are well-expanded. Bibasilar densities persist. There is no pneumothorax or significant pleural effusion. There is subsegmental atelectasis in the left lower lung. The cardiac silhouette is top-normal in size. The pulmonary vascularity is less congested. There is calcification in the wall of the aortic arch. The right internal jugular venous catheter tip projects over the midportion of the SVC. IMPRESSION: Improving CHF. Bibasilar subsegmental atelectasis. No significant pleural effusion. Thoracic aortic athero scleroses. Electronically Signed   By: David  Martinique M.D.   On: 02/07/2017 07:33   Dg Chest Port 1 View  Result Date: 02/06/2017 CLINICAL DATA:  Intubated patient, status post aortic valve replacement yesterday. EXAM: PORTABLE CHEST 1 VIEW COMPARISON:  Portable chest x-ray of February 05, 2017 FINDINGS: The lungs are well-expanded. The interstitial markings are less conspicuous. The central pulmonary vascularity remains prominent. There is a trace of pleural fluid on the left. The cardiac silhouette is enlarged. The prosthetic aortic valve and aortic stent appear stable. The endotracheal tube tip projects 3.2 cm above the carina. The esophagogastric tube tip projects below the inferior margin of the image. The right internal jugular venous catheter  tip projects over the midportion of the SVC. IMPRESSION: Decreased pulmonary interstitial edema and pulmonary vascular congestion. Tiny left pleural effusion. The support tubes are in stable  position. Thoracic aortic atherosclerosis. Electronically Signed   By: David  [place]ique M.D.   On: 02/06/2017 07:40   Dg Chest Port 1 View  Result Date: 02/05/2017 CLINICAL DATA:  Postop for TAVR. EXAM: PORTABLE CHEST 1 VIEW COMPARISON:  Not 2018, earlier the same day FINDINGS: 1551 hours. Leftward patient rotation. Endotracheal tube tip is 8 mm above the base of the carina and could be retracted 10-15 mm for more appropriate positioning. NG tube appears to be looped upon itself with the tip positioned in the distal esophagus and directed cranially. Left IJ sheath is evident. A right IJ central line tip projects over the expected location of the mid SVC. Cardiopericardial silhouette is enlarged. There is vascular congestion with bibasilar atelectasis. Bones are diffusely demineralized. Telemetry leads overlie the chest. IMPRESSION: 1. Endotracheal tube tip is 8 mm above the base of the carina. 2. NG tube tip is looped upon itself with the tip directed cranially and positioned in the distal esophagus. 3. Cardiomegaly with vascular congestion and bibasilar atelectasis. I discussed these findings by telephone with the patient's nurse, Altha Harm , at 1610 hours on 02/05/2017. Electronically Signed   By: Misty Stanley M.D.   On: 02/05/2017 16:11   Dg Chest Port 1 View  Result Date: 02/05/2017 CLINICAL DATA:  Status post TAVR. EXAM: PORTABLE CHEST 1 VIEW COMPARISON:  Chest x-ray dated February 01, 2017. FINDINGS: Interval TAVR. Right internal jugular central venous catheter with the tip projecting over the distal SVC. Stable cardiomegaly. Mild pulmonary vascular congestion. Atherosclerotic calcification of the aortic arch. Bibasilar atelectasis. No pneumothorax or pleural effusion. No acute osseous abnormality. IMPRESSION: Interval TAVR. Mild pulmonary vascular congestion with bibasilar atelectasis. Electronically Signed   By: Titus Dubin M.D.   On: 02/05/2017 10:56   Dg Abd Portable 1v  Result Date:  02/05/2017 CLINICAL DATA:  Encounter for orogastric tube placement EXAM: PORTABLE ABDOMEN - 1 VIEW COMPARISON:  02/05/2017 at 1444 hours FINDINGS: Targeted study of the left hemiabdomen from orogastric tube position. The tip and side port of a gastric tube are identified in the left upper quadrant of the abdomen in the expected location of the proximal stomach. A vascular stent projects over the included heart. Blunting the left costophrenic angle is seen. Surgical clips project over the left breast shadow. IMPRESSION: Gastric tube tip and side-port are in the expected location the stomach. Electronically Signed   By: Ashley Royalty M.D.   On: 02/05/2017 20:12   Anti-infectives: Anti-infectives    Start     Dose/Rate Route Frequency Ordered Stop   02/08/17 1015  ceFAZolin (ANCEF) IVPB 1 g/50 mL premix    Comments:  Send with pt to OR   1 g 100 mL/hr over 30 Minutes Intravenous On call 02/08/17 1010 02/08/17 1045   02/06/17 1800  cefUROXime (ZINACEF) 1.5 g in dextrose 5 % 50 mL IVPB     1.5 g 100 mL/hr over 30 Minutes Intravenous Every 12 hours 02/06/17 1005 02/07/17 0550   02/06/17 1015  cefUROXime (ZINACEF) 1.5 g in dextrose 5 % 50 mL IVPB  Status:  Discontinued     1.5 g 100 mL/hr over 30 Minutes Intravenous Every 12 hours 02/06/17 1001 02/06/17 1005   02/05/17 2300  vancomycin (VANCOCIN) IVPB 1000 mg/200 mL premix     1,000 mg 200 mL/hr over 60 Minutes  Intravenous  Once 02/05/17 1516 02/06/17 0120   02/05/17 2000  cefUROXime (ZINACEF) 1.5 g in dextrose 5 % 50 mL IVPB  Status:  Discontinued     1.5 g 100 mL/hr over 30 Minutes Intravenous Every 12 hours 02/05/17 1032 02/05/17 1516   02/05/17 1900  cefUROXime (ZINACEF) 1.5 g in dextrose 5 % 50 mL IVPB  Status:  Discontinued     1.5 g 100 mL/hr over 30 Minutes Intravenous Every 12 hours 02/05/17 1516 02/06/17 1001   02/05/17 1800  vancomycin (VANCOCIN) IVPB 1000 mg/200 mL premix  Status:  Discontinued     1,000 mg 200 mL/hr over 60 Minutes  Intravenous  Once 02/05/17 1032 02/05/17 1516   02/05/17 0400  vancomycin (VANCOCIN) 1,250 mg in sodium chloride 0.9 % 250 mL IVPB  Status:  Discontinued     1,250 mg 166.7 mL/hr over 90 Minutes Intravenous To Surgery 02/04/17 1352 02/05/17 1032   02/05/17 0400  cefUROXime (ZINACEF) 1.5 g in dextrose 5 % 50 mL IVPB     1.5 g 100 mL/hr over 30 Minutes Intravenous To Surgery 02/04/17 1352 02/05/17 0811      Assessment/Plan: s/p Procedure(s): EVACUATION HEMATOMA (Left) Continued large output from her left Blake drain. 700 cc over the past 12 hours. Hematocrit down to 2427 down to 24 from 27 24 hours ago. Platelets 67,000. No evidence of active bleeding but continued large amount of serosanguineous drainage. Continue current treatment.   LOS: 5 days   Curt Jews 02/10/2017, 5:44 AM

## 2017-02-11 ENCOUNTER — Encounter: Payer: Self-pay | Admitting: Thoracic Surgery (Cardiothoracic Vascular Surgery)

## 2017-02-11 DIAGNOSIS — I5033 Acute on chronic diastolic (congestive) heart failure: Secondary | ICD-10-CM

## 2017-02-11 LAB — GLUCOSE, CAPILLARY
GLUCOSE-CAPILLARY: 127 mg/dL — AB (ref 65–99)
GLUCOSE-CAPILLARY: 138 mg/dL — AB (ref 65–99)
Glucose-Capillary: 162 mg/dL — ABNORMAL HIGH (ref 65–99)
Glucose-Capillary: 155 mg/dL — ABNORMAL HIGH (ref 65–99)
Glucose-Capillary: 238 mg/dL — ABNORMAL HIGH (ref 65–99)
Glucose-Capillary: 182 mg/dL — ABNORMAL HIGH (ref 65–99)

## 2017-02-11 LAB — CBC WITH DIFFERENTIAL/PLATELET
Basophils Absolute: 0 10*3/uL (ref 0.0–0.1)
Basophils Relative: 0 %
EOS ABS: 0.2 10*3/uL (ref 0.0–0.7)
EOS PCT: 2 %
HCT: 25.6 % — ABNORMAL LOW (ref 36.0–46.0)
HEMOGLOBIN: 8.3 g/dL — AB (ref 12.0–15.0)
LYMPHS ABS: 1.1 10*3/uL (ref 0.7–4.0)
Lymphocytes Relative: 10 %
MCH: 29.9 pg (ref 26.0–34.0)
MCHC: 32.4 g/dL (ref 30.0–36.0)
MCV: 92.1 fL (ref 78.0–100.0)
MONOS PCT: 12 %
Monocytes Absolute: 1.3 10*3/uL — ABNORMAL HIGH (ref 0.1–1.0)
Neutro Abs: 8.6 10*3/uL — ABNORMAL HIGH (ref 1.7–7.7)
Neutrophils Relative %: 77 %
PLATELETS: 87 10*3/uL — AB (ref 150–400)
RBC: 2.78 MIL/uL — ABNORMAL LOW (ref 3.87–5.11)
RDW: 15.9 % — ABNORMAL HIGH (ref 11.5–15.5)
WBC: 11.2 10*3/uL — ABNORMAL HIGH (ref 4.0–10.5)

## 2017-02-11 LAB — RENAL FUNCTION PANEL
Albumin: 2 g/dL — ABNORMAL LOW (ref 3.5–5.0)
Anion gap: 4 — ABNORMAL LOW (ref 5–15)
BUN: 40 mg/dL — ABNORMAL HIGH (ref 6–20)
CHLORIDE: 109 mmol/L (ref 101–111)
CO2: 23 mmol/L (ref 22–32)
CREATININE: 1.43 mg/dL — AB (ref 0.44–1.00)
Calcium: 8.1 mg/dL — ABNORMAL LOW (ref 8.9–10.3)
GFR calc non Af Amer: 34 mL/min — ABNORMAL LOW (ref >60)
GFR, EST AFRICAN AMERICAN: 40 mL/min — AB (ref >60)
Glucose, Bld: 129 mg/dL — ABNORMAL HIGH (ref 65–99)
POTASSIUM: 4.2 mmol/L (ref 3.5–5.1)
Phosphorus: 1.7 mg/dL — ABNORMAL LOW (ref 2.5–4.6)
Sodium: 136 mmol/L (ref 135–145)

## 2017-02-11 LAB — MAGNESIUM: MAGNESIUM: 1.8 mg/dL (ref 1.7–2.4)

## 2017-02-11 MED ORDER — INFLUENZA VAC SPLIT HIGH-DOSE 0.5 ML IM SUSY: 0.5000 mL | PREFILLED_SYRINGE | INTRAMUSCULAR | Status: DC | PRN

## 2017-02-11 MED ORDER — CLOPIDOGREL BISULFATE 75 MG PO TABS
75.0000 mg | ORAL_TABLET | Freq: Every day | ORAL | Status: DC
  Administered 2017-02-12 – 2017-02-27 (×15): 75 mg via ORAL
  Filled 2017-02-11 (×15): qty 1

## 2017-02-11 MED ORDER — DOCUSATE SODIUM 100 MG PO CAPS
200.0000 mg | ORAL_CAPSULE | Freq: Two times a day (BID) | ORAL | Status: DC
  Administered 2017-02-11 – 2017-02-27 (×26): 200 mg via ORAL
  Filled 2017-02-11 (×29): qty 2

## 2017-02-11 MED ORDER — FUROSEMIDE 10 MG/ML IJ SOLN
40.0000 mg | Freq: Two times a day (BID) | INTRAMUSCULAR | Status: DC
  Administered 2017-02-11 – 2017-02-18 (×16): 40 mg via INTRAVENOUS
  Filled 2017-02-11 (×15): qty 4

## 2017-02-11 MED ORDER — PNEUMOCOCCAL VAC POLYVALENT 25 MCG/0.5ML IJ INJ
0.5000 mL | INJECTION | INTRAMUSCULAR | Status: DC | PRN
Start: 2017-02-14 — End: 2017-02-27

## 2017-02-11 MED ORDER — ASPIRIN EC 81 MG PO TBEC
81.0000 mg | DELAYED_RELEASE_TABLET | Freq: Every day | ORAL | Status: DC
  Administered 2017-02-11 – 2017-02-27 (×16): 81 mg via ORAL
  Filled 2017-02-11 (×16): qty 1

## 2017-02-11 NOTE — Progress Notes (Signed)
Subjective  -   Sitting up in chair No complaints of pain   Physical Exam:  Large dressing in left groin JP in place, connected to suction Palpable pedal pulses    JP: 910   Assessment/Plan:    Hb increased this am Creatinine improved today Will need dressing change once back in bed OK to restart ASA and Plavix Keep JP in Place  [name] 02/11/2017 9:24 AM --  Vitals:   02/11/17 0700 02/11/17 0917  BP:  (!) 143/52  Pulse:  76  Resp:    Temp: 99.5 F (37.5 C)   SpO2:      Intake/Output Summary (Last 24 hours) at 02/11/17 0924 Last data filed at 02/11/17 0600  Gross per 24 hour  Intake              810 ml  Output             2515 ml  Net            -1705 ml     Laboratory CBC    Component Value Date/Time   WBC 11.2 (H) 02/11/2017 0700   HGB 8.3 (L) 02/11/2017 0700   HGB 10.1 (L) 12/27/2016 0831   HGB 11.2 (L) 02/23/2015 1029   HCT 25.6 (L) 02/11/2017 0700   HCT 29.6 (L) 12/27/2016 0831   HCT 33.6 (L) 02/23/2015 1029   PLT 87 (L) 02/11/2017 0700   PLT 145 (L) 12/27/2016 0831    BMET    Component Value Date/Time   NA 136 02/11/2017 0700   NA 140 01/04/2017 1101   NA 141 02/23/2015 1029   K 4.2 02/11/2017 0700   K 4.3 02/23/2015 1029   CL 109 02/11/2017 0700   CL 111 (H) 08/28/2012 1224   CO2 23 02/11/2017 0700   CO2 26 02/23/2015 1029   GLUCOSE 129 (H) 02/11/2017 0700   GLUCOSE 193 (H) 02/23/2015 1029   GLUCOSE 103 (H) 08/28/2012 1224   BUN 40 (H) 02/11/2017 0700   BUN 33 (H) 01/04/2017 1101   BUN 20.2 02/23/2015 1029   CREATININE 1.43 (H) 02/11/2017 0700   CREATININE 1.3 (H) 02/23/2015 1029   CALCIUM 8.1 (L) 02/11/2017 0700   CALCIUM 9.5 02/23/2015 1029   GFRNONAA 34 (L) 02/11/2017 0700   GFRAA 40 (L) 02/11/2017 0700    COAG Lab Results  Component Value Date   INR 1.12 02/08/2017   INR 1.38 02/05/2017   INR 1.60 02/05/2017   No results found for: PTT  Antibiotics Anti-infectives    Start     Dose/Rate Route  Frequency Ordered Stop   02/08/17 1015  ceFAZolin (ANCEF) IVPB 1 g/50 mL premix    Comments:  Send with pt to OR   1 g 100 mL/hr over 30 Minutes Intravenous On call 02/08/17 1010 02/08/17 1045   02/06/17 1800  cefUROXime (ZINACEF) 1.5 g in dextrose 5 % 50 mL IVPB     1.5 g 100 mL/hr over 30 Minutes Intravenous Every 12 hours 02/06/17 1005 02/07/17 0550   02/06/17 1015  cefUROXime (ZINACEF) 1.5 g in dextrose 5 % 50 mL IVPB  Status:  Discontinued     1.5 g 100 mL/hr over 30 Minutes Intravenous Every 12 hours 02/06/17 1001 02/06/17 1005   02/05/17 2300  vancomycin (VANCOCIN) IVPB 1000 mg/200 mL premix     1,000 mg 200 mL/hr over 60 Minutes Intravenous  Once 02/05/17 1516 02/06/17 0120   02/05/17 2000  cefUROXime (ZINACEF) 1.5  g in dextrose 5 % 50 mL IVPB  Status:  Discontinued     1.5 g 100 mL/hr over 30 Minutes Intravenous Every 12 hours 02/05/17 1032 02/05/17 1516   02/05/17 1900  cefUROXime (ZINACEF) 1.5 g in dextrose 5 % 50 mL IVPB  Status:  Discontinued     1.5 g 100 mL/hr over 30 Minutes Intravenous Every 12 hours 02/05/17 1516 02/06/17 1001   02/05/17 1800  vancomycin (VANCOCIN) IVPB 1000 mg/200 mL premix  Status:  Discontinued     1,000 mg 200 mL/hr over 60 Minutes Intravenous  Once 02/05/17 1032 02/05/17 1516   02/05/17 0400  vancomycin (VANCOCIN) 1,250 mg in sodium chloride 0.9 % 250 mL IVPB  Status:  Discontinued     1,250 mg 166.7 mL/hr over 90 Minutes Intravenous To Surgery 02/04/17 1352 02/05/17 1032   02/05/17 0400  cefUROXime (ZINACEF) 1.5 g in dextrose 5 % 50 mL IVPB     1.5 g 100 mL/hr over 30 Minutes Intravenous To Surgery 02/04/17 1352 02/05/17 0811       V. Leia Alf, M.D. [facility] Office: [phone] Pager:  [phone]

## 2017-02-11 NOTE — Care Management Note (Addendum)
Case Management Note  Patient Details  Name: Carrie Monroe MRN: [id number] Date of Birth: [birth year]  Subjective/Objective:     From home, presents with severe aortic stenosis, for  TAVR.    9/12 [place], BSN - in [place]  patient having hypotension and tachycardia, requiring pressors , hbg down, received transfusion (8 units prbc) 2 units FFP, chest pain and resp distress, left groin bleed , required intubation and exploratory evacuation of left retroperitoneal hematoma with wound vac.   9/14 [place], BSN - Evacuation of retroperitoneal hematoma and repair of femoral artery exploration, hs severe thrombocytopenia, wound vac removed, to return to OR for re-exploration.    [place], BSN - TAVR, Acute Resp failure, Hemorrhagic shock- evacuation of retroperitoneal hematoma, s/p re-exploration , JP drain to suction, per Cards note, she will need a R CEA after she recovers from TAVR.  Awaiting pt eval.   9/21 [place] RN, BSN -  Per pt eval rec SNF, CSW aware.                Action/Plan:  NCM will follow for dc needs.   Expected Discharge Date:                  Expected Discharge Plan:     In-House Referral:     Discharge planning Services  CM Consult  Post Acute Care Choice:    Choice offered to:     DME Arranged:    DME Agency:     HH Arranged:    HH Agency:     Status of Service:  In process, will continue to follow  If discussed at Long Length of Stay Meetings, dates discussed:    Additional Comments:  [name], RN 02/11/2017, 10:25 AM

## 2017-02-11 NOTE — Plan of Care (Signed)
Problem: Activity: Goal: Risk for activity intolerance will decrease Outcome: Progressing Worked with PT today

## 2017-02-11 NOTE — Progress Notes (Signed)
      [address] Center[address]       [place] [address]             [phone]        CARDIOTHORACIC SURGERY PROGRESS NOTE   R3 Days Post-Op Procedure(s) (LRB): EVACUATION HEMATOMA (Left)  Subjective: Reports feeling better.  Soreness LLQ and groin improved.  Ambulating short distances w/ assistance.  No SOB.  Good appetite.  No BM x 7 days  Objective: Vital signs: BP Readings from Last 1 Encounters:  02/11/17 (!) 143/52   Pulse Readings from Last 1 Encounters:  02/11/17 87   Resp Readings from Last 1 Encounters:  02/11/17 (!) 22   Temp Readings from Last 1 Encounters:  02/10/17 99.3 F (37.4 C) (Oral)    Hemodynamics:    Physical Exam:  Rhythm:   sinus  Breath sounds: clear  Heart sounds:  RRR w/ systolic murmur  Incisions:  Dressings dry, intact  Abdomen:  Soft, non-distended, minimally-tender  Extremities:  Warm, well-perfused  Drain:   significant volume thin sanguinous output, volume trending down   Intake/Output from previous day: 09/16 0701 - 09/17 0700 In: 1170 [P.O.:1170] Out: 2765 [Urine:1855; Drains:910] Intake/Output this shift: No intake/output data recorded.  Lab Results:  CBC: Recent Labs  02/10/17 0325 02/11/17 0700  WBC 9.6 11.2*  HGB 7.8* 8.3*  HCT 24.5* 25.6*  PLT 67* 87*    BMET:  Recent Labs  02/10/17 0323 02/11/17 0700  NA 136 136  K 4.1 4.2  CL 106 109  CO2 26 23  GLUCOSE 154* 129*  BUN 40* 40*  CREATININE 1.75* 1.43*  CALCIUM 8.1* 8.1*     PT/INR:   Recent Labs  02/08/17 1241  LABPROT 14.3  INR 1.12    CBG (last 3)   Recent Labs  02/10/17 2007 02/10/17 2350 02/11/17 0336  GLUCAP 240* 203* 162*    ABG    Component Value Date/Time   PHART 7.425 02/06/2017 0952   PCO2ART 37.7 02/06/2017 0952   PO2ART 121.0 (H) 02/06/2017 0952   HCO3 24.8 02/06/2017 0952   TCO2 24 02/06/2017 1624   ACIDBASEDEF 4.0 (H) 02/05/2017 1545   O2SAT 99.0 02/06/2017 0952    CXR: n/a  Assessment/Plan: S/P  Procedure(s) (LRB): EVACUATION HEMATOMA (Left)  Overall slowly improving Maintaining NSR w/ stable BP Breathing comfortably w/ O2 sats 95-96% on 2 L/min Expected post op acute blood loss anemia, Hgb up appropriately after transfusion yesterday Drain output significant but trending down Acute renal failure resolving Post op thrombocytopenia, resolving Acute on chronic diastolic CHF with expected post-op volume excess, weight reportedly 32 lbs > preop baseline CAD s/p multivessel PCI AS s/p TAVR Hypertension Type II diabetes mellitus, adequate glycemic control Morbid obesity Severe physical debility w/ limited mobility    Need to restart DAPT for CAD, recent stents and TAVR as soon as practical - will defer to Dr Trula Slade and Dr Angelena Form  Management of groin wound and drain remains a priority and potentially still a risk for blood loss and/or infection  PT consult to assist w/ mobility  Watch anemia, renal function closely  Diuresis  Stool softeners  I spent in excess of 15 minutes during the conduct of this hospital encounter and >50% of this time involved direct face-to-face encounter with the patient for counseling and/or coordination of their care.   Rexene Alberts, MD 02/11/2017 8:28 AM

## 2017-02-11 NOTE — Evaluation (Signed)
Physical Therapy Evaluation Patient Details Name: Carrie Monroe MRN: [id number] DOB: 10-Jun-1935 Today's Date: 02/11/2017   History of Present Illness  81 y.o. female with history of chronic diastolic CHF, HTN, HLD, DM, gout, breast cancer, CAD, severe aortic stenosis and severe carotid artery disease who was admitted following TAVR on 02/05/17. Following the case she had an active bleed from her left groin that we were unable to control with manual hemostasis. She had a significant blood loss with shock and was taken to the OR on 9/11 for femoral artery repair. Her Hg dropped again and she then went back to the OR on 9/14 for evacuation of hematoma and Blake drain placement with primary closure.   Clinical Impression  Patient is s/p above surgery resulting in functional limitations due to the deficits listed below (see PT Problem List). Pt is currently limited by ankle pain and fatigue. Pt is currently modA for bed mobility and transfers and minA for ambulation of 2 feet. Pt requested to stop due to ankle pain and reluctantly agreed to standing exercises.  Patient will benefit from skilled PT to increase their independence and safety with mobility to allow discharge to the venue listed below.       Follow Up Recommendations SNF    Equipment Recommendations  Other (comment) (to be determined at next venue)    Recommendations for Other Services       Precautions / Restrictions Precautions Precautions: None Restrictions Weight Bearing Restrictions: No      Mobility  Bed Mobility Overal bed mobility: Needs Assistance Bed Mobility: Supine to Sit     Supine to sit: Mod assist     General bed mobility comments: modA for pad scoot to EoB and trunk to upright, pt with excessive posterior lean requiring max verbal cuing to lean forward so as not to slide off of bed, pt reports too painful to lean forward  Transfers Overall transfer level: Needs assistance Equipment used: Rolling  walker (2 wheeled) Transfers: Sit to/from Stand Sit to Stand: Mod assist         General transfer comment: modA for powerup and steady with RW, pt with c/o of increased R ankle pain with standing  Ambulation/Gait Ambulation/Gait assistance: Min assist Ambulation Distance (Feet): 2 Feet Assistive device: Rolling walker (2 wheeled) Gait Pattern/deviations: Step-to pattern;Decreased step length - left;Decreased stance time - right;Antalgic;Shuffle Gait velocity: slowed Gait velocity interpretation: Below normal speed for age/gender General Gait Details: minA for steadying, pt able to bear weight through R LE with increase in pain ambulated 2 feet towards chair and requested to sit        Balance Overall balance assessment: Needs assistance Sitting-balance support: Bilateral upper extremity supported;Feet supported Sitting balance-Leahy Scale: Poor Sitting balance - Comments: requires UE support to maintain balance Postural control: Posterior lean Standing balance support: Bilateral upper extremity supported Standing balance-Leahy Scale: Poor Standing balance comment: requires RW assist                             Pertinent Vitals/Pain Pain Assessment: 0-10 Pain Score: 8  Pain Location: entire L side and R ankle Pain Descriptors / Indicators: Throbbing;Constant;Sore;Aching    Home Living Family/patient expects to be discharged to:: Private residence Living Arrangements: Spouse/significant other Available Help at Discharge:  (pt states that she won't have help once she is discharged) Type of Home: House Home Access: Ramped entrance     Home Layout: One level Home  Equipment: Bedside commode;Grab bars - tub/shower      Prior Function Level of Independence: Independent         Comments: community ambulator and driver, independent in ADLs and iADLs     Hand Dominance        Extremity/Trunk Assessment   Upper Extremity Assessment Upper Extremity  Assessment: Overall WFL for tasks assessed    Lower Extremity Assessment Lower Extremity Assessment: RLE deficits/detail;Generalized weakness RLE Deficits / Details: R ankle pain 2' to rolling her ankle yesterday while walking, pain greater with inversion and dorsiflexion, limiting ROM, hip and knee ROM limited by bocy habitus RLE: Unable to fully assess due to pain       Communication   Communication: No difficulties  Cognition Arousal/Alertness: Awake/alert Behavior During Therapy: Flat affect Overall Cognitive Status: Within Functional Limits for tasks assessed                                        General Comments General comments (skin integrity, edema, etc.): prior to activity BP 138/54 HR 86, with activity max HR 105bpm, after activity BP 148/57    Exercises Total Joint Exercises Ankle Circles/Pumps: AROM;10 reps;Standing Standing Hip Extension: AROM;Right;10 reps;Standing General Exercises - Lower Extremity Long Arc Quad: AROM;Left;10 reps;Seated Hip ABduction/ADduction: AROM;Right;10 reps;Standing Straight Leg Raises: AROM;Right;10 reps;Standing Hip Flexion/Marching: Both;AROM;10 reps;Standing;Seated   Assessment/Plan    PT Assessment Patient needs continued PT services  PT Problem List Decreased strength;Decreased activity tolerance;Decreased range of motion;Decreased balance;Decreased mobility;Cardiopulmonary status limiting activity;Pain       PT Treatment Interventions DME instruction;Gait training;Functional mobility training;Therapeutic activities;Therapeutic exercise;Balance training;Patient/family education    PT Goals (Current goals can be found in the Care Plan section)  Acute Rehab PT Goals Patient Stated Goal: be in less pain PT Goal Formulation: With patient Time For Goal Achievement: 02/25/17 Potential to Achieve Goals: Fair    Frequency Min 3X/week   Barriers to discharge Decreased caregiver support         AM-PAC PT "6  Clicks" Daily Activity  Outcome Measure Difficulty turning over in bed (including adjusting bedclothes, sheets and blankets)?: Unable Difficulty moving from lying on back to sitting on the side of the bed? : Unable Difficulty sitting down on and standing up from a chair with arms (e.g., wheelchair, bedside commode, etc,.)?: Unable Help needed moving to and from a bed to chair (including a wheelchair)?: A Little Help needed walking in hospital room?: A Little Help needed climbing 3-5 steps with a railing? : A Lot 6 Click Score: 11    End of Session Equipment Utilized During Treatment: Gait belt Activity Tolerance: Patient limited by pain;Patient limited by fatigue Patient left: in chair;with call bell/phone within reach Nurse Communication: Mobility status PT Visit Diagnosis: Unsteadiness on feet (R26.81);Other abnormalities of gait and mobility (R26.89);History of falling (Z91.81);Muscle weakness (generalized) (M62.81);Difficulty in walking, not elsewhere classified (R26.2);Pain Pain - Right/Left: Right Pain - part of body: Ankle and joints of foot    Time: 4580-9983 PT Time Calculation (min) (ACUTE ONLY): 38 min   Charges:   PT Evaluation $PT Eval Moderate Complexity: 1 Mod PT Treatments $Therapeutic Exercise: 8-22 mins $Therapeutic Activity: 8-22 mins   PT G Codes:        Kiesha B. Migdalia Dk PT, DPT Acute Rehabilitation  [phone] Pager [phone]    [place] 02/11/2017, 6:05 PM

## 2017-02-11 NOTE — Plan of Care (Signed)
Problem: Bowel/Gastric: Goal: Will not experience complications related to bowel motility Outcome: Progressing Had BM today

## 2017-02-11 NOTE — Progress Notes (Signed)
[facility]   MULTIDISCIPLINARY HEART VALVE TEAM   Patient Name: Carrie Monroe Date of Encounter: 02/11/2017  Primary Cardiologist: Dr. Croitoru/McAlhany  Hospital Problem List     Active Problems:   S/P TAVR (transcatheter aortic valve replacement)   Acute respiratory failure with hypoxia (HCC)   Hemorrhagic shock (HCC)     Subjective   Up in chair this morning. Slightly less tender in groin area. Weak and tired but otherwise slowly improving.   Inpatient Medications    Scheduled Meds: . chlorhexidine gluconate (MEDLINE KIT)  15 mL Mouth Rinse BID  . Chlorhexidine Gluconate Cloth  6 each Topical Daily  . docusate sodium  200 mg Oral BID  . ferrous fumarate-b12-vitamic C-folic acid  1 capsule Oral TID PC  . furosemide  40 mg Intravenous BID  . insulin aspart  0-24 Units Subcutaneous Q4H  . insulin detemir  28 Units Subcutaneous BID  . mouth rinse  15 mL Mouth Rinse QID  . metoprolol tartrate  25 mg Oral BID  . rosuvastatin  5 mg Oral q1800  . sodium chloride flush  3 mL Intravenous Q12H   Continuous Infusions: . sodium chloride    . lactated ringers     PRN Meds: docusate sodium, fentaNYL (SUBLIMAZE) injection, lactated ringers, metoprolol tartrate, ondansetron (ZOFRAN) IV, oxyCODONE-acetaminophen, sodium chloride flush, sodium chloride flush   Vital Signs    Vitals:   02/11/17 0500 02/11/17 0600 02/11/17 0700 02/11/17 0917  BP: (!) 148/58 (!) 143/52  (!) 143/52  Pulse: 88 87  76  Resp: (!) 24 (!) 22    Temp:   99.5 F (37.5 C)   TempSrc:   Oral   SpO2: 93% 96%    Weight:  278 lb 14.1 oz (126.5 kg)    Height:        Intake/Output Summary (Last 24 hours) at 02/11/17 0919 Last data filed at 02/11/17 0600  Gross per 24 hour  Intake              810 ml  Output             2515 ml  Net            -1705 ml   Filed Weights   02/08/17 1042 02/10/17 0359 02/11/17 0600  Weight: 280 lb (127 kg) 278 lb 3.5 oz (126.2 kg) 278 lb 14.1 oz  (126.5 kg)    Physical Exam   GEN: Well nourished, well developed, in no acute distress. Morbidly obese, sitting up in chair HEENT: Grossly normal.  Neck: Supple, no JVD, carotid bruits, or masses. Cardiac: RRR, 2/6 soft murmur @RUSB, rubs, or gallops. No clubbing, cyanosis, edema.  Radials/DP/PT 2+ and equal bilaterally.  Respiratory:  Respirations regular and unlabored, clear to auscultation bilaterally. GI: Soft, nontender, nondistended, BS + x 4. MS: no deformity or atrophy. Skin: warm and dry, no rash. Large, tender left groin hematoma with blake drain in place.  Neuro:  Strength and sensation are intact. Psych: AAOx3.  Normal affect.  Labs    CBC  Recent Labs  02/10/17 0325 02/11/17 0700  WBC 9.6 11.2*  NEUTROABS 7.1 8.6*  HGB 7.8* 8.3*  HCT 24.5* 25.6*  MCV 92.1 92.1  PLT 67* 87*   Basic Metabolic Panel  Recent Labs  02/10/17 0323 02/11/17 0700  NA 136 136  K 4.1 4.2  CL 106 109  CO2 26 23  GLUCOSE 154* 129*  BUN 40* 40*  CREATININE 1.75* 1.43*    CALCIUM 8.1* 8.1*  MG 2.0 1.8  PHOS 2.3* 1.7*   Liver Function Tests  Recent Labs  02/10/17 0323 02/11/17 0700  ALBUMIN 2.0* 2.0*   No results for input(s): LIPASE, AMYLASE in the last 72 hours. Cardiac Enzymes No results for input(s): CKTOTAL, CKMB, CKMBINDEX, TROPONINI in the last 72 hours. BNP Invalid input(s): POCBNP D-Dimer  Recent Labs  02/08/17 1241  DDIMER 3.34*   Hemoglobin A1C No results for input(s): HGBA1C in the last 72 hours. Fasting Lipid Panel No results for input(s): CHOL, HDL, LDLCALC, TRIG, CHOLHDL, LDLDIRECT in the last 72 hours. Thyroid Function Tests No results for input(s): TSH, T4TOTAL, T3FREE, THYROIDAB in the last 72 hours.  Invalid input(s): FREET3  Telemetry    NSR - Personally Reviewed  ECG    NSR, LBBB - Personally Reviewed  Radiology    No results found.  Cardiac Studies   2D ECHO: 02/06/2017 LV EF: 50% -55% Study Conclusions - Left ventricle:  The cavity size was normal. There was mild   concentric hypertrophy. Systolic function was normal. The   estimated ejection fraction was in the range of 50% to 55%. Wall   motion was normal; there were no regional wall motion   abnormalities. Doppler parameters are consistent with abnormal   left ventricular relaxation (grade 1 diastolic dysfunction). - Ventricular septum: Septal motion showed abnormal function and   dyssynergy. - Aortic valve: Medtronic CoreValve Evolut Pro (size 26mm) was   functioning normally. Peak velocity (S): 156 cm/s. Mean gradient   (S): 5 mm Hg. Valve area (Vmax): 1.38 cm^2. - Mitral valve: Moderately calcified annulus. - Pericardium, extracardiac: There was a left pleural effusion.  Patient Profile     81 y.o. female with history of chronic diastolic CHF, HTN, HLD, DM, gout, breast cancer, CAD, severe aortic stenosis and severe carotid artery disease who was admitted following TAVR on 02/05/17. Following the case she had an active bleed from her left groin that we were unable to control with manual hemostasis. She had a significant blood loss with shock and was taken to the OR on 9/11 for femoral artery repair. Her Hg dropped again and she then went back to the OR on 9/14 for evacuation of hematoma and Blake drain placement with primary closure.   Assessment & Plan    Severe AS: s/p successful TAVR with a 26 mm Medtronic CoreValve Evolut Pro via L TF approach on 02/05/17. Post operative echo 02/06/17 showed good valve placement with no PVL. ECG with new LBBB but no bradyarrhythmia. ASA/Plavix given once on 9/13 but otherwise has been held given on going bleeding. Her Hg has remained stable after her second surgery. I discussed case with Dr. Brabham this morning and okayed restarting ASA/Plavix given DES x2 to her LAD and LCx 8/3 and TAVR last week.   Acute blood loss anemia: Her Hg has stabilized after ~18 PRBCs transfusions and two trips to the OR. We will need to  watch this closely since we are resuming ASA/plavix.   Thrombocytopenia: PLTs improving 42--> 59--> 67--> 87.   Left groin hematoma: Post op she had a large hematoma with active bleeding resulting in hypovolemic shock requiring pressors and resuscitation with 18 units of pRBCs, platelets, cryo, FFP and closure of the artery in the OR on 9/11 and hematoma evacuation on 9/14. Now s/p Blake drain placement, which is being managed by [facility].   Acute respiratory failure/shock: now extubated and off all pressors   Acute on chronic diastolic CHF:   up 32 lbs from pre op. She was being diuresed with IV lasix, but this was held given drop in Hg and AKI. Kidney function improving. Dr. Owen ordered IV lasix 40mg BID.   AKI: creatinine improving 2.48--> 1.91--> 1.75--> 1.43. Likely from ATN 2/2 hypovolemic shock. Continue to monitor.   CAD: she underwent DES to mLAD, PCTA to oDiag, and DES to LCx on 12/28/16. Plan to resume DAPT with ASA/plavix   Carotid artery disease: she will require R CEA after she recovers from TAVR  Severe physical debility w/ limited mobility: will order PT consult  Signed, Kathryn Thompson, PA-C  02/11/2017, 9:19 AM    Kathryn Thompson 02/11/2017 9:19 AM  Patient seen and examined and history reviewed. Agree with above findings and plan. Hgb is improved. Platelet count is getting better. Plan to resume DAPT today with ASA and Plavix. She is volume overloaded and will need diuresis. Nurse reports she fell last night and complains of right ankle pain. Exam of the ankle reveals no swelling. Mild tenderness and good ROM. Will observe for now.    , MDFACC 02/11/2017 10:00 AM        

## 2017-02-12 DIAGNOSIS — I2510 Atherosclerotic heart disease of native coronary artery without angina pectoris: Secondary | ICD-10-CM

## 2017-02-12 LAB — MAGNESIUM: Magnesium: 1.8 mg/dL (ref 1.7–2.4)

## 2017-02-12 LAB — CBC WITH DIFFERENTIAL/PLATELET
BASOS ABS: 0 10*3/uL (ref 0.0–0.1)
Basophils Relative: 0 %
EOS ABS: 0.3 10*3/uL (ref 0.0–0.7)
EOS PCT: 3 %
HCT: 25 % — ABNORMAL LOW (ref 36.0–46.0)
Hemoglobin: 8.1 g/dL — ABNORMAL LOW (ref 12.0–15.0)
Lymphocytes Relative: 10 %
Lymphs Abs: 1.1 10*3/uL (ref 0.7–4.0)
MCH: 29.5 pg (ref 26.0–34.0)
MCHC: 32.4 g/dL (ref 30.0–36.0)
MCV: 90.9 fL (ref 78.0–100.0)
Monocytes Absolute: 1.3 10*3/uL — ABNORMAL HIGH (ref 0.1–1.0)
Monocytes Relative: 12 %
Neutro Abs: 7.9 10*3/uL — ABNORMAL HIGH (ref 1.7–7.7)
Neutrophils Relative %: 75 %
PLATELETS: 108 10*3/uL — AB (ref 150–400)
RBC: 2.75 MIL/uL — AB (ref 3.87–5.11)
RDW: 16.1 % — ABNORMAL HIGH (ref 11.5–15.5)
WBC: 10.6 10*3/uL — AB (ref 4.0–10.5)

## 2017-02-12 LAB — GLUCOSE, CAPILLARY
GLUCOSE-CAPILLARY: 121 mg/dL — AB (ref 65–99)
GLUCOSE-CAPILLARY: 171 mg/dL — AB (ref 65–99)
GLUCOSE-CAPILLARY: 175 mg/dL — AB (ref 65–99)
Glucose-Capillary: 102 mg/dL — ABNORMAL HIGH (ref 65–99)
Glucose-Capillary: 207 mg/dL — ABNORMAL HIGH (ref 65–99)
Glucose-Capillary: 190 mg/dL — ABNORMAL HIGH (ref 65–99)

## 2017-02-12 LAB — COMPREHENSIVE METABOLIC PANEL
ALK PHOS: 78 U/L (ref 38–126)
ALT: 21 U/L (ref 14–54)
ANION GAP: 6 (ref 5–15)
AST: 26 U/L (ref 15–41)
Albumin: 2.2 g/dL — ABNORMAL LOW (ref 3.5–5.0)
BILIRUBIN TOTAL: 1.5 mg/dL — AB (ref 0.3–1.2)
BUN: 44 mg/dL — ABNORMAL HIGH (ref 6–20)
CALCIUM: 8.6 mg/dL — AB (ref 8.9–10.3)
CO2: 24 mmol/L (ref 22–32)
Chloride: 107 mmol/L (ref 101–111)
Creatinine, Ser: 1.49 mg/dL — ABNORMAL HIGH (ref 0.44–1.00)
GFR calc non Af Amer: 32 mL/min — ABNORMAL LOW (ref >60)
GFR, EST AFRICAN AMERICAN: 38 mL/min — AB (ref >60)
Glucose, Bld: 117 mg/dL — ABNORMAL HIGH (ref 65–99)
POTASSIUM: 4 mmol/L (ref 3.5–5.1)
SODIUM: 137 mmol/L (ref 135–145)
TOTAL PROTEIN: 5.2 g/dL — AB (ref 6.5–8.1)

## 2017-02-12 LAB — PREALBUMIN: Prealbumin: 9.5 mg/dL — ABNORMAL LOW (ref 18–38)

## 2017-02-12 LAB — URIC ACID: Uric Acid, Serum: 8.4 mg/dL — ABNORMAL HIGH (ref 2.3–6.6)

## 2017-02-12 LAB — PHOSPHORUS: PHOSPHORUS: 2.3 mg/dL — AB (ref 2.5–4.6)

## 2017-02-12 MED ORDER — COLCHICINE 0.6 MG PO TABS
1.2000 mg | ORAL_TABLET | Freq: Once | ORAL | Status: AC
  Administered 2017-02-12: 1.2 mg via ORAL
  Filled 2017-02-12 (×2): qty 2

## 2017-02-12 MED ORDER — COLCHICINE 0.6 MG PO TABS
0.6000 mg | ORAL_TABLET | Freq: Three times a day (TID) | ORAL | Status: AC
  Administered 2017-02-12 – 2017-02-13 (×6): 0.6 mg via ORAL
  Filled 2017-02-12 (×4): qty 1

## 2017-02-12 NOTE — Clinical Social Work Note (Signed)
Clinical Social Work Assessment  Patient Details  Name: Carrie Monroe MRN: [id number] Date of Birth: 03/14/1936  Date of referral:  02/12/17               Reason for consult:  Facility Placement                Permission sought to share information with:  Facility Art therapist granted to share information::  Yes, Verbal Permission Granted  Name::        Agency::  SNF  Relationship::     Contact Information:     Housing/Transportation Living arrangements for the past 2 months:  Single Family Home Source of Information:  Patient Patient Interpreter Needed:  None Criminal Activity/Legal Involvement Pertinent to Current Situation/Hospitalization:  No - Comment as needed Significant Relationships:  Adult Children, Spouse Lives with:  Spouse Do you feel safe going back to the place where you live?  No Need for family participation in patient care:  No (Coment)  Care giving concerns:  Pt lives at home with spouse.  Patient reports her spouse is "an invalid" and that she is the primary caregiver.  Patient now with increased care needs and cannot care for herself properly.   Social Worker assessment / plan:  CSW spoke with pt concerning PT recommendation for SNF at this time.  Patient has had past SNF stay and is familiar.   Employment status:  Retired Nurse, adult PT Recommendations:  [place] / Referral to community resources:  [place]  Patient/Family's Response to care:  Patient agreeable to SNF for short term rehab- states her husband is being taken to [facility] for respite care so might be interested in their rehab program.  Patient/Family's Understanding of and Emotional Response to Diagnosis, Current Treatment, and Prognosis:  Pt expresses good understanding of current condition- a little down about the sudden decline but optimistic about her recovery.  Emotional  Assessment Appearance:  Appears stated age Attitude/Demeanor/Rapport:    Affect (typically observed):  Appropriate Orientation:  Oriented to Self, Oriented to Place, Oriented to  Time, Oriented to Situation Alcohol / Substance use:  Not Applicable Psych involvement (Current and /or in the community):  No (Comment)  Discharge Needs  Concerns to be addressed:  Care Coordination Readmission within the last 30 days:  No Current discharge risk:  Physical Impairment Barriers to Discharge:  Continued Medical Work up   Jorge Ny, LCSW 02/12/2017, 3:34 PM

## 2017-02-12 NOTE — NC FL2 (Signed)
[place] LEVEL OF CARE SCREENING TOOL     IDENTIFICATION  Patient Name: Carrie Monroe Birthdate: [birth year] Sex: female Admission Date (Current Location): 02/05/2017  [facility] and [place] Number:  Herbalist and Address:  [facility] [place]. [facility], [place] [address] [place], [place] [address]      Provider Number: [id number]  Attending Physician Name and Address:  Burnell Blanks,*  Relative Name and Phone Number:       Current Level of Care: Hospital Recommended Level of Care: [place] Prior Approval Number:    Date Approved/Denied:   PASRR Number: [id number]  Discharge Plan: SNF    Current Diagnoses: Patient Active Problem List   Diagnosis Date Noted  . Acute respiratory failure with hypoxia ([place])   . Hemorrhagic shock ([place])   . 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 Carotid artery stenosis   . Coronary artery disease   . Chronic renal insufficiency, stage III (moderate) 12/22/2016  . Dental abscess- s/p multiple tooth extraction 12/21/16 12/22/2016  . Unstable angina ([place]) 12/17/2016  . Essential hypertension 01/27/2016  . Morbid obesity due to excess calories ([place]) 01/27/2016  . Hyponatremia 08/04/2013  . Gout 06/22/2013  . Dyslipidemia 05/08/2011  . Diabetes mellitus type 2 in obese ([place]) 05/08/2011  . Chronic diastolic heart failure ([place]) 05/08/2011  . Severe aortic stenosis 05/08/2011  . Breast cancer of upper-outer quadrant of left female breast ([place]) 03/16/2011    Orientation RESPIRATION BLADDER Height & Weight     Self, Time, Situation, Place  Normal Continent Weight: 274 lb 4 oz (124.4 kg) Height:  5\' 3"  (160 cm)  BEHAVIORAL SYMPTOMS/MOOD NEUROLOGICAL BOWEL NUTRITION STATUS      Continent Diet (carb modified, cardiac)  AMBULATORY STATUS COMMUNICATION OF NEEDS Skin   Extensive Assist Verbally Surgical wounds (2x day gauze changes)                        Personal Care Assistance Level of Assistance  Bathing, Dressing Bathing Assistance: Maximum assistance   Dressing Assistance: Maximum assistance     Functional Limitations Info             SPECIAL CARE FACTORS FREQUENCY  PT (By licensed PT), OT (By licensed OT)     PT Frequency: 5/wk OT Frequency: 5/wk            Contractures      Additional Factors Info  Code Status, Allergies, Insulin Sliding Scale Code Status Info: FULL Allergies Info: NKA   Insulin Sliding Scale Info: 6day       Current Medications (02/12/2017):  This is the current hospital active medication list Current Facility-Administered Medications  Medication Dose Route Frequency Provider Last Rate Last Dose  . 0.9 %  sodium chloride infusion  250 mL Intravenous Continuous Rexene Alberts, MD      . aspirin EC tablet 81 mg  81 mg Oral Daily Rexene Alberts, MD   81 mg at 02/12/17 0911  . clopidogrel (PLAVIX) tablet 75 mg  75 mg Oral Q breakfast Rexene Alberts, MD   75 mg at 02/12/17 0900  . colchicine tablet 0.6 mg  0.6 mg Oral Q8H Eileen Stanford, PA-C   0.6 mg at 02/12/17 1349  . docusate sodium (COLACE) capsule 100 mg  100 mg Oral Daily PRN Sueanne Margarita, MD   100 mg at 02/10/17 2108  . docusate sodium (COLACE) capsule 200 mg  200 mg Oral BID Rexene Alberts, MD   200 mg at 02/12/17 0900  . fentaNYL (SUBLIMAZE) injection 25-50 mcg  25-50 mcg Intravenous Q1H PRN Javier Glazier, MD   50 mcg at 02/12/17 0375  . ferrous OHKGOVPC-H40-BTCYELY C-folic acid (TRINSICON / FOLTRIN) capsule 1 capsule  1 capsule Oral TID PC Prescott Gum, Collier [place]ina, MD   1 capsule at 02/12/17 1349  . furosemide (LASIX) injection 40 mg  40 mg Intravenous BID Rexene Alberts, MD   40 mg at 02/12/17 0900  . [START ON 02/14/2017] Influenza vac split quadrivalent PF (FLUZONE HIGH-DOSE) injection 0.5 mL  0.5 mL Intramuscular Prior to discharge Burnell Blanks, MD      . insulin aspart (novoLOG) injection 0-24 Units  0-24  Units Subcutaneous Q4H Rexene Alberts, MD   4 Units at 02/12/17 1139  . insulin detemir (LEVEMIR) injection 28 Units  28 Units Subcutaneous BID Rexene Alberts, MD   28 Units at 02/12/17 0915  . lactated ringers infusion 500 mL  500 mL Intravenous Once PRN Rexene Alberts, MD      . metoprolol tartrate (LOPRESSOR) injection 2.5-5 mg  2.5-5 mg Intravenous Q2H PRN Rexene Alberts, MD      . metoprolol tartrate (LOPRESSOR) tablet 25 mg  25 mg Oral BID Burnell Blanks, MD   25 mg at 02/12/17 0901  . ondansetron (ZOFRAN) injection 4 mg  4 mg Intravenous Q6H PRN Rexene Alberts, MD      . oxyCODONE-acetaminophen (PERCOCET/ROXICET) 5-325 MG per tablet 1 tablet  1 tablet Oral Q6H PRN Sueanne Margarita, MD   1 tablet at 02/09/17 2318  . [START ON 02/14/2017] pneumococcal 23 valent vaccine (PNU-IMMUNE) injection 0.5 mL  0.5 mL Intramuscular Prior to discharge Burnell Blanks, MD      . rosuvastatin (CRESTOR) tablet 5 mg  5 mg Oral q1800 Burnell Blanks, MD   5 mg at 02/11/17 1821  . sodium chloride flush (NS) 0.9 % injection 3 mL  3 mL Intravenous Q12H Rexene Alberts, MD   10 mL at 02/11/17 2232  . sodium chloride flush (NS) 0.9 % injection 3 mL  3 mL Intravenous PRN Rexene Alberts, MD   3 mL at 02/12/17 1140     Discharge Medications: Please see discharge summary for a list of discharge medications.  Relevant Imaging Results:  Relevant Lab Results:   Additional Information SS#: [id number]  Jorge Ny, LCSW

## 2017-02-12 NOTE — Progress Notes (Signed)
Subjective  -   C/o right leg pain, feels like gout   Physical Exam:  Sitting in chair Wound examined.  Incision intact  JP 500 x 24 hrs, 50 since midnight    Assessment/Plan:    Hb remains stable.  No active bleeding.  JP output trending down Will check uric acid levels as pt feels her right leg is gout.  She also fell 2 days ago, so may need to check Carrie Monroe 02/12/2017 9:55 AM --  Vitals:   02/12/17 0824 02/12/17 0829  BP:    Pulse: 83   Resp: 18   Temp:  98.8 F (37.1 C)  SpO2: 94%     Intake/Output Summary (Last 24 hours) at 02/12/17 0955 Last data filed at 02/12/17 0500  Gross per 24 hour  Intake             1950 ml  Output             2176 ml  Net             -226 ml     Laboratory CBC    Component Value Date/Time   WBC 10.6 (H) 02/12/2017 0514   HGB 8.1 (L) 02/12/2017 0514   HGB 10.1 (L) 12/27/2016 0831   HGB 11.2 (L) 02/23/2015 1029   HCT 25.0 (L) 02/12/2017 0514   HCT 29.6 (L) 12/27/2016 0831   HCT 33.6 (L) 02/23/2015 1029   PLT 108 (L) 02/12/2017 0514   PLT 145 (L) 12/27/2016 0831    BMET    Component Value Date/Time   NA 137 02/12/2017 0314   NA 140 01/04/2017 1101   NA 141 02/23/2015 1029   K 4.0 02/12/2017 0314   K 4.3 02/23/2015 1029   CL 107 02/12/2017 0314   CL 111 (H) 08/28/2012 1224   CO2 24 02/12/2017 0314   CO2 26 02/23/2015 1029   GLUCOSE 117 (H) 02/12/2017 0314   GLUCOSE 193 (H) 02/23/2015 1029   GLUCOSE 103 (H) 08/28/2012 1224   BUN 44 (H) 02/12/2017 0314   BUN 33 (H) 01/04/2017 1101   BUN 20.2 02/23/2015 1029   CREATININE 1.49 (H) 02/12/2017 0314   CREATININE 1.3 (H) 02/23/2015 1029   CALCIUM 8.6 (L) 02/12/2017 0314   CALCIUM 9.5 02/23/2015 1029   GFRNONAA 32 (L) 02/12/2017 0314   GFRAA 38 (L) 02/12/2017 0314    COAG Lab Results  Component Value Date   INR 1.12 02/08/2017   INR 1.38 02/05/2017   INR 1.60 02/05/2017   No results found for: PTT  Antibiotics Anti-infectives    Start      Dose/Rate Route Frequency Ordered Stop   02/08/17 1015  ceFAZolin (ANCEF) IVPB 1 g/50 mL premix    Comments:  Send with pt to OR   1 g 100 mL/hr over 30 Minutes Intravenous On call 02/08/17 1010 02/08/17 1045   02/06/17 1800  cefUROXime (ZINACEF) 1.5 g in dextrose 5 % 50 mL IVPB     1.5 g 100 mL/hr over 30 Minutes Intravenous Every 12 hours 02/06/17 1005 02/07/17 0550   02/06/17 1015  cefUROXime (ZINACEF) 1.5 g in dextrose 5 % 50 mL IVPB  Status:  Discontinued     1.5 g 100 mL/hr over 30 Minutes Intravenous Every 12 hours 02/06/17 1001 02/06/17 1005   02/05/17 2300  vancomycin (VANCOCIN) IVPB 1000 mg/200 mL premix     1,000 mg 200 mL/hr over 60 Minutes Intravenous  Once 02/05/17 1516  02/06/17 0120   02/05/17 2000  cefUROXime (ZINACEF) 1.5 g in dextrose 5 % 50 mL IVPB  Status:  Discontinued     1.5 g 100 mL/hr over 30 Minutes Intravenous Every 12 hours 02/05/17 1032 02/05/17 1516   02/05/17 1900  cefUROXime (ZINACEF) 1.5 g in dextrose 5 % 50 mL IVPB  Status:  Discontinued     1.5 g 100 mL/hr over 30 Minutes Intravenous Every 12 hours 02/05/17 1516 02/06/17 1001   02/05/17 1800  vancomycin (VANCOCIN) IVPB 1000 mg/200 mL premix  Status:  Discontinued     1,000 mg 200 mL/hr over 60 Minutes Intravenous  Once 02/05/17 1032 02/05/17 1516   02/05/17 0400  vancomycin (VANCOCIN) 1,250 mg in sodium chloride 0.9 % 250 mL IVPB  Status:  Discontinued     1,250 mg 166.7 mL/hr over 90 Minutes Intravenous To Surgery 02/04/17 1352 02/05/17 1032   02/05/17 0400  cefUROXime (ZINACEF) 1.5 g in dextrose 5 % 50 mL IVPB     1.5 g 100 mL/hr over 30 Minutes Intravenous To Surgery 02/04/17 1352 02/05/17 0811       V. Leia Alf, M.D. [facility] Office: [phone] Pager:  [phone]

## 2017-02-12 NOTE — Progress Notes (Signed)
[place] VALVE TEAM   Patient Name: Carrie Monroe Date of Encounter: 02/12/2017  Primary Cardiologist: Dr. [facility] Problem List     Active Problems:   S/P TAVR (transcatheter aortic valve replacement)   Acute respiratory failure with hypoxia ([place])   Hemorrhagic shock (HCC)     Subjective   Up in chair this morning. Biggest complaint is right ankle pain- similar to previous got. Also had a near fall the other night but son caught her.   Inpatient Medications    Scheduled Meds: . aspirin EC  81 mg Oral Daily  . clopidogrel  75 mg Oral Q breakfast  . docusate sodium  200 mg Oral BID  . ferrous [id number] C-folic acid  1 capsule Oral TID PC  . furosemide  40 mg Intravenous BID  . insulin aspart  0-24 Units Subcutaneous Q4H  . insulin detemir  28 Units Subcutaneous BID  . metoprolol tartrate  25 mg Oral BID  . rosuvastatin  5 mg Oral q1800  . sodium chloride flush  3 mL Intravenous Q12H   Continuous Infusions: . sodium chloride    . lactated ringers     PRN Meds: docusate sodium, fentaNYL (SUBLIMAZE) injection, [START ON 02/14/2017] Influenza vac split quadrivalent PF, lactated ringers, metoprolol tartrate, ondansetron (ZOFRAN) IV, oxyCODONE-acetaminophen, [START ON 02/14/2017] pneumococcal 23 valent vaccine, sodium chloride flush   Vital Signs    Vitals:   02/12/17 0600 02/12/17 0700 02/12/17 0824 02/12/17 0829  BP: (!) 131/58 (!) 150/58    Pulse: 68 82 83   Resp: 20 19 18    Temp:    98.8 F (37.1 C)  TempSrc:    Oral  SpO2: 92% 92% 94%   Weight:      Height:        Intake/Output Summary (Last 24 hours) at 02/12/17 0910 Last data filed at 02/12/17 0500  Gross per 24 hour  Intake             1950 ml  Output             2176 ml  Net             -226 ml   Filed Weights   02/10/17 0359 02/11/17 0600 02/12/17 0530  Weight: 278 lb 3.5 oz (126.2 kg) 278 lb 14.1 oz (126.5 kg) 274 lb 4  oz (124.4 kg)    Physical Exam   GEN: Well nourished, well developed, in no acute distress. Morbidly obese, sitting up in chair HEENT: Grossly normal.  Neck: Supple, no JVD, carotid bruits, or masses. Cardiac: RRR, 2/6 soft murmur @RUSB , rubs, or gallops. No clubbing, cyanosis, edema.  Radials/DP/PT 2+ and equal bilaterally.  Respiratory:  Respirations regular and unlabored, clear to auscultation bilaterally. GI: Soft, nontender, nondistended, BS + x 4. MS: no deformity or atrophy. Skin: warm and dry, no rash. Large, tender left groin hematoma with blake drain in place.  Neuro:  Strength and sensation are intact. Psych: AAOx3.  Normal affect.  Labs    CBC  Recent Labs  02/11/17 0700 02/12/17 0514  WBC 11.2* 10.6*  NEUTROABS 8.6* 7.9*  HGB 8.3* 8.1*  HCT 25.6* 25.0*  MCV 92.1 90.9  PLT 87* 564*   Basic Metabolic Panel  Recent Labs  02/11/17 0700 02/12/17 0314  NA 136 137  K 4.2 4.0  CL 109 107  CO2 23 24  GLUCOSE 129* 117*  BUN 40* 44*  CREATININE 1.43* 1.49*  CALCIUM 8.1* 8.6*  MG 1.8 1.8  PHOS 1.7* 2.3*   Liver Function Tests  Recent Labs  02/11/17 0700 02/12/17 0314  AST  --  26  ALT  --  21  ALKPHOS  --  78  BILITOT  --  1.5*  PROT  --  5.2*  ALBUMIN 2.0* 2.2*   No results for input(s): LIPASE, AMYLASE in the last 72 hours. Cardiac Enzymes No results for input(s): CKTOTAL, CKMB, CKMBINDEX, TROPONINI in the last 72 hours. BNP Invalid input(s): POCBNP D-Dimer No results for input(s): DDIMER in the last 72 hours. Hemoglobin A1C No results for input(s): HGBA1C in the last 72 hours. Fasting Lipid Panel No results for input(s): CHOL, HDL, LDLCALC, TRIG, CHOLHDL, LDLDIRECT in the last 72 hours. Thyroid Function Tests No results for input(s): TSH, T4TOTAL, T3FREE, THYROIDAB in the last 72 hours.  Invalid input(s): FREET3  Telemetry    NSR, few PVCs - Personally Reviewed  ECG    NSR, LBBB - Personally Reviewed  Radiology    No results  found.  Cardiac Studies   2D ECHO: 02/06/2017 LV EF: 50% -55% Study Conclusions - Left ventricle: The cavity size was normal. There was mild   concentric hypertrophy. Systolic function was normal. The   estimated ejection fraction was in the range of 50% to 55%. Wall   motion was normal; there were no regional wall motion   abnormalities. Doppler parameters are consistent with abnormal   left ventricular relaxation (grade 1 diastolic dysfunction). - Ventricular septum: Septal motion showed abnormal function and   dyssynergy. - Aortic valve: Medtronic CoreValve Evolut Pro (size 76mm) was   functioning normally. Peak velocity (S): 156 cm/s. Mean gradient   (S): 5 mm Hg. Valve area (Vmax): 1.38 cm^2. - Mitral valve: Moderately calcified annulus. - Pericardium, extracardiac: There was a left pleural effusion.  Patient Profile     81 y.o. female with history of chronic diastolic CHF, HTN, HLD, DM, gout, breast cancer, CAD, severe aortic stenosis and severe carotid artery disease who was admitted following TAVR on 02/05/17. Following the case she had an active bleed from her left groin that we were unable to control with manual hemostasis. She had a significant blood loss with shock and was taken to the OR on 9/11 for femoral artery repair. Her Hg dropped again and she then went back to the OR on 9/14 for evacuation of hematoma and Blake drain placement with primary closure.   Assessment & Plan    Severe AS: s/p successful TAVR with a 26 mm Medtronic CoreValve Evolut Pro via L TF approach on 02/05/17. Post operative echo 02/06/17 showed good valve placement with no PVL. ECG with new LBBB but no bradyarrhythmia. Her Hg has remained stable after her second surgery. She has been resumed on her ASA/Plavix given DES x2 to her LAD and LCx 8/3 and TAVR last week.   Acute blood loss anemia: Her Hg has stabilized after ~18 PRBCs transfusions and two trips to the OR. We will need to watch this closely  given antiplatelet therapy  Thrombocytopenia: PLTs improving 42--> 59--> 67--> 87--> 108  Left groin hematoma: Post op she had a large hematoma with active bleeding resulting in hypovolemic shock requiring pressors and resuscitation with 18 units of pRBCs, platelets, cryo, FFP and closure of the artery in the OR on 9/11 and hematoma evacuation on 9/14. Now s/p Blake drain placement, which is being managed by [facility].   Acute respiratory failure/shock: now extubated and off  all pressors   Acute on chronic diastolic CHF: up ~35 lbs from pre op. We are diuresing her with IV lasix and weight down 4 lbs from yesterday.   AKI: creatinine improving 2.48--> 1.91--> 1.75--> 1.43--> 1.49 which is her baseline.   CAD: she underwent DES to mLAD, PCTA to oDiag, and DES to LCx on 12/28/16. Continue DAPT with ASA/plavix   Carotid artery disease: she will require R CEA after she recovers from TAVR  Severe physical debility w/ limited mobility: PT recommending SNF at discharge  Right ankle pain: apparently she had a near fall the other night and her son caught her. She doesn't remember twisting her ankle or any blunt trauma. She has a history of gout and it does feel similar to her previous gout flares. Examination of foot without edema or erythema. Will send a uric acid and empirically start colchicine 1.2 tablets now followed by 0.6mg  in one hour followed by 0.6mg  q 8 hours as directed by pharmacy. She says colchicine usually helps her gout. If pain does not resolve we will need to think about steroids or an ankle X ray.   Signed, Angelena Form, PA-C  02/12/2017, 9:10 AM   I have personally seen and examined this patient with Angelena Form, PA-C. I agree with the assessment and plan as outlined above. She is doing well today. BP is stable. H/h stable. ASA and Plavix have been restarted. [facility]VS following wound/drain.   Possible move to telemetry bed tomorrow.   Carrie Monroe 02/12/2017 1:22  PM'

## 2017-02-13 ENCOUNTER — Inpatient Hospital Stay (HOSPITAL_COMMUNITY): Payer: Medicare Other

## 2017-02-13 LAB — CBC WITH DIFFERENTIAL/PLATELET
BASOS ABS: 0 10*3/uL (ref 0.0–0.1)
BASOS PCT: 0 %
Eosinophils Absolute: 0.3 10*3/uL (ref 0.0–0.7)
Eosinophils Relative: 3 %
HEMATOCRIT: 19.9 % — AB (ref 36.0–46.0)
HEMOGLOBIN: 6.6 g/dL — AB (ref 12.0–15.0)
Lymphocytes Relative: 14 %
Lymphs Abs: 1.4 10*3/uL (ref 0.7–4.0)
MCH: 30 pg (ref 26.0–34.0)
MCHC: 33.2 g/dL (ref 30.0–36.0)
MCV: 90.5 fL (ref 78.0–100.0)
Monocytes Absolute: 1.1 10*3/uL — ABNORMAL HIGH (ref 0.1–1.0)
Monocytes Relative: 11 %
NEUTROS PCT: 72 %
Neutro Abs: 7.2 10*3/uL (ref 1.7–7.7)
Platelets: 143 10*3/uL — ABNORMAL LOW (ref 150–400)
RBC: 2.2 MIL/uL — AB (ref 3.87–5.11)
RDW: 16 % — ABNORMAL HIGH (ref 11.5–15.5)
WBC: 10 10*3/uL (ref 4.0–10.5)

## 2017-02-13 LAB — GLUCOSE, CAPILLARY
GLUCOSE-CAPILLARY: 114 mg/dL — AB (ref 65–99)
GLUCOSE-CAPILLARY: 136 mg/dL — AB (ref 65–99)
Glucose-Capillary: 99 mg/dL (ref 65–99)
Glucose-Capillary: 136 mg/dL — ABNORMAL HIGH (ref 65–99)
Glucose-Capillary: 127 mg/dL — ABNORMAL HIGH (ref 65–99)

## 2017-02-13 LAB — RENAL FUNCTION PANEL
ANION GAP: 5 (ref 5–15)
Albumin: 2 g/dL — ABNORMAL LOW (ref 3.5–5.0)
BUN: 48 mg/dL — ABNORMAL HIGH (ref 6–20)
CALCIUM: 8.4 mg/dL — AB (ref 8.9–10.3)
CO2: 25 mmol/L (ref 22–32)
Chloride: 104 mmol/L (ref 101–111)
Creatinine, Ser: 1.51 mg/dL — ABNORMAL HIGH (ref 0.44–1.00)
GFR, EST AFRICAN AMERICAN: 37 mL/min — AB (ref >60)
GFR, EST NON AFRICAN AMERICAN: 32 mL/min — AB (ref >60)
Glucose, Bld: 107 mg/dL — ABNORMAL HIGH (ref 65–99)
PHOSPHORUS: 3.1 mg/dL (ref 2.5–4.6)
Potassium: 3.8 mmol/L (ref 3.5–5.1)
SODIUM: 134 mmol/L — AB (ref 135–145)

## 2017-02-13 LAB — HEMOGLOBIN AND HEMATOCRIT, BLOOD
HEMATOCRIT: 33.8 % — AB (ref 36.0–46.0)
HEMOGLOBIN: 11.2 g/dL — AB (ref 12.0–15.0)

## 2017-02-13 LAB — PREPARE RBC (CROSSMATCH)

## 2017-02-13 LAB — MAGNESIUM: MAGNESIUM: 1.7 mg/dL (ref 1.7–2.4)

## 2017-02-13 MED ORDER — SODIUM CHLORIDE 0.9 % IV SOLN
Freq: Once | INTRAVENOUS | Status: AC
  Administered 2017-02-13: 12:00:00 via INTRAVENOUS

## 2017-02-13 MED ORDER — SODIUM CHLORIDE 0.9 % IV SOLN: Freq: Once | INTRAVENOUS | Status: DC

## 2017-02-13 NOTE — Progress Notes (Signed)
PT Cancellation Note  Patient Details Name: Carrie Monroe MRN: [id number] DOB: [birth year]   Cancelled Treatment:    Reason Eval/Treat Not Completed: Medical issues which prohibited therapy (Pt needs 2 units of blood per nurse prior to PT. Will check back as able.)   Denice Paradise 02/13/2017, 10:16 AM  [facility] [phone] (pager)

## 2017-02-13 NOTE — Progress Notes (Signed)
[place] VALVE TEAM   Patient Name: Carrie Monroe Date of Encounter: 02/13/2017  Primary Cardiologist: Dr. [facility] Problem List     Active Problems:   S/P TAVR (transcatheter aortic valve replacement)   Acute respiratory failure with hypoxia (HCC)   Hemorrhagic shock (HCC)     Subjective   Feels okay. Wishes she could says that she is feeling better.  Inpatient Medications    Scheduled Meds: . aspirin EC  81 mg Oral Daily  . clopidogrel  75 mg Oral Q breakfast  . colchicine  0.6 mg Oral Q8H  . docusate sodium  200 mg Oral BID  . ferrous [id number] C-folic acid  1 capsule Oral TID PC  . furosemide  40 mg Intravenous BID  . insulin aspart  0-24 Units Subcutaneous Q4H  . insulin detemir  28 Units Subcutaneous BID  . metoprolol tartrate  25 mg Oral BID  . rosuvastatin  5 mg Oral q1800  . sodium chloride flush  3 mL Intravenous Q12H   Continuous Infusions: . sodium chloride    . sodium chloride    . lactated ringers     PRN Meds: docusate sodium, fentaNYL (SUBLIMAZE) injection, [START ON 02/14/2017] Influenza vac split quadrivalent PF, lactated ringers, metoprolol tartrate, ondansetron (ZOFRAN) IV, oxyCODONE-acetaminophen, [START ON 02/14/2017] pneumococcal 23 valent vaccine, sodium chloride flush   Vital Signs    Vitals:   02/13/17 0413 02/13/17 0500 02/13/17 0600 02/13/17 0700  BP:  (!) 139/57 (!) 120/49   Pulse:  72 (!) 58   Resp:  19 19   Temp: 99.3 F (37.4 C)   98.9 F (37.2 C)  TempSrc: Oral   Oral  SpO2:  94% 93%   Weight:  266 lb 15.6 oz (121.1 kg)    Height:        Intake/Output Summary (Last 24 hours) at 02/13/17 0939 Last data filed at 02/13/17 0600  Gross per 24 hour  Intake              660 ml  Output              950 ml  Net             -290 ml   Filed Weights   02/11/17 0600 02/12/17 0530 02/13/17 0500  Weight: 278 lb 14.1 oz (126.5 kg) 274 lb 4 oz (124.4 kg)  266 lb 15.6 oz (121.1 kg)    Physical Exam   GEN: Well nourished, well developed, in no acute distress. Morbidly obese, sitting up in chair HEENT: Grossly normal.  Neck: Supple, no JVD, carotid bruits, or masses. Cardiac: RRR, 2/6 soft murmur @RUSB , rubs, or gallops. No clubbing, cyanosis, edema.  Radials/DP/PT 2+ and equal bilaterally.  Respiratory:  Respirations regular and unlabored, clear to auscultation bilaterally. GI: Soft, nontender, nondistended, BS + x 4. MS: no deformity or atrophy. Skin: warm and dry, no rash. Large, tender left groin hematoma with drain in place.  Neuro:  Strength and sensation are intact. Psych: AAOx3.  Normal affect.  Labs    CBC  Recent Labs  02/12/17 0514 02/13/17 0415  WBC 10.6* 10.0  NEUTROABS 7.9* 7.2  HGB 8.1* 6.6*  HCT 25.0* 19.9*  MCV 90.9 90.5  PLT 108* 631*   Basic Metabolic Panel  Recent Labs  02/12/17 0314 02/13/17 0415  NA 137 134*  K 4.0 3.8  CL 107 104  CO2 24 25  GLUCOSE 117* 107*  BUN 44* 48*  CREATININE 1.49* 1.51*  CALCIUM 8.6* 8.4*  MG 1.8 1.7  PHOS 2.3* 3.1   Liver Function Tests  Recent Labs  02/12/17 0314 02/13/17 0415  AST 26  --   ALT 21  --   ALKPHOS 78  --   BILITOT 1.5*  --   PROT 5.2*  --   ALBUMIN 2.2* 2.0*   No results for input(s): LIPASE, AMYLASE in the last 72 hours. Cardiac Enzymes No results for input(s): CKTOTAL, CKMB, CKMBINDEX, TROPONINI in the last 72 hours. BNP Invalid input(s): POCBNP D-Dimer No results for input(s): DDIMER in the last 72 hours. Hemoglobin A1C No results for input(s): HGBA1C in the last 72 hours. Fasting Lipid Panel No results for input(s): CHOL, HDL, LDLCALC, TRIG, CHOLHDL, LDLDIRECT in the last 72 hours. Thyroid Function Tests No results for input(s): TSH, T4TOTAL, T3FREE, THYROIDAB in the last 72 hours.  Invalid input(s): FREET3  Telemetry    NSR, few PVCs - Personally Reviewed  ECG    NSR, LBBB - Personally Reviewed  Radiology    Ct  Abdomen Pelvis Wo Contrast  Result Date: 02/13/2017 CLINICAL DATA:  Generalized abdominal pain. Acute anemia. Hematemesis. Postop day 7 transcatheter aortic valve replacement. Postop day 4 hematoma evacuation. Personal history of left upper outer quadrant breast cancer post lumpectomy in 2016. EXAM: CT ABDOMEN AND PELVIS WITHOUT CONTRAST TECHNIQUE: Multidetector CT imaging of the abdomen and pelvis was performed following the standard protocol without IV contrast. COMPARISON:  12/18/2016. FINDINGS: Lower chest: 4 mm nodule in the right middle lobe (series 5, image 9) as seen on the prior examination and on the CTA chest 01/10/2017. Small bilateral pleural effusions, a new finding. Scattered areas of hyperlucency in the lower lobes, unchanged. Lungs otherwise clear. Aortic valve prosthesis post TAVR. Dense mitral annular calcification. Right coronary and left circumflex coronary artery atherosclerosis. Very small, insignificant pericardial effusion. Hepatobiliary: Two calcified granulomas in the liver. No significant focal hepatic parenchymal abnormality, allowing for the unenhanced technique. Surgically absent gallbladder. No biliary ductal dilation. Pancreas: Normal unenhanced appearance. Spleen: Normal unenhanced appearance. Adrenals/Urinary Tract: Normal appearing adrenal glands. Benign cyst in the upper pole of the right kidney measuring approximately 2 cm. Nonobstructing very small calculi involving a lower pole calyx of the right kidney and mid calices of the left kidney. No visible ureteral calculi on either side. Within the limits of the unenhanced technique, no significant focal parenchymal abnormality involving either kidney. Urinary bladder decompressed and unremarkable. Stomach/Bowel: Stomach decompressed and unremarkable. Normal-appearing small bowel. Large stool burden throughout the colon from cecum to rectum. Scattered diverticula throughout the colon without evidence of acute diverticulitis. Very  small decompressed appendix as noted previously without evidence acute appendicitis. Vascular/Lymphatic: Aortoiliofemoral atherosclerosis without evidence of aneurysm. No pathologic lymphadenopathy. Reproductive: Normal appearing uterus. No adnexal masses. Calcification within the right ovary as noted previously. Other: Large hematoma in the right lateral abdominal wall measuring approximately 10 x 16 cm (best seen on sagittal series 7, image 2). Surgical drain in the left lateral abdominal wall without evidence of hematoma. Retroperitoneal hematoma involving the left lower pelvis adjacent to the urinary bladder measuring approximately 9 x 7 x 8 cm. Likely post radiation skin thickening and trabecular thickening involving the left breast. Musculoskeletal: Osseous demineralization. DISH involving the lower thoracic spine. Multilevel degenerative disc disease and facet degenerative changes throughout the lumbar spine. Degenerative changes involving the symphysis pubis. Moderate to severe multifactorial spinal stenosis at L4-5. IMPRESSION: 1. Large hematoma involving the right lateral  abdominal wall measuring approximately 10 x 16 cm. 2. Retroperitoneal hematoma involving the left lower pelvis measuring approximately 9 x 7 x 8 cm. 3. Surgical drain in the left lateral abdominal wall without evidence of hematoma. 4. 4 mm right middle lobe lung nodule as noted previously. Please see prior recommendations for follow-up. 5. Nonobstructing bilateral renal calculi. 6. Large colonic stool burden. Scattered colonic diverticula without evidence of acute diverticulitis. 7. Likely post radiation skin thickening and trabecular thickening involving the left breast. Electronically Signed   By: Evangeline Dakin M.D.   On: 02/13/2017 09:24    Cardiac Studies   2D ECHO: 02/06/2017 LV EF: 50% -55% Study Conclusions - Left ventricle: The cavity size was normal. There was mild   concentric hypertrophy. Systolic function was  normal. The   estimated ejection fraction was in the range of 50% to 55%. Wall   motion was normal; there were no regional wall motion   abnormalities. Doppler parameters are consistent with abnormal   left ventricular relaxation (grade 1 diastolic dysfunction). - Ventricular septum: Septal motion showed abnormal function and   dyssynergy. - Aortic valve: Medtronic CoreValve Evolut Pro (size 53mm) was   functioning normally. Peak velocity (S): 156 cm/s. Mean gradient   (S): 5 mm Hg. Valve area (Vmax): 1.38 cm^2. - Mitral valve: Moderately calcified annulus. - Pericardium, extracardiac: There was a left pleural effusion.  CT abdomen 02/13/17 IMPRESSION: 1. Large hematoma involving the right lateral abdominal wall measuring approximately 10 x 16 cm. 2. Retroperitoneal hematoma involving the left lower pelvis measuring approximately 9 x 7 x 8 cm. 3. Surgical drain in the left lateral abdominal wall without evidence of hematoma. 4. 4 mm right middle lobe lung nodule as noted previously. Please see prior recommendations for follow-up. 5. Nonobstructing bilateral renal calculi. 6. Large colonic stool burden. Scattered colonic diverticula without evidence of acute diverticulitis. 7. Likely post radiation skin thickening and trabecular thickening involving the left breast  Patient Profile     81 y.o. female with history of chronic diastolic CHF, HTN, HLD, DM, gout, breast cancer, CAD, severe aortic stenosis and severe carotid artery disease who was admitted following TAVR on 02/05/17. Following the case she had an active bleed from her left groin that we were unable to control with manual hemostasis. She had a significant blood loss with shock and was taken to the OR on 9/11 for femoral artery repair. Her Hg dropped again and she then went back to the OR on 9/14 for evacuation of hematoma and Blake drain placement with primary closure.   Assessment & Plan    Severe AS: s/p successful TAVR  with a 26 mm Medtronic CoreValve Evolut Pro via L TF approach on 02/05/17. Post operative echo 02/06/17 showed good valve placement with no PVL. ECG with new LBBB but no bradyarrhythmia. She has been resumed on her ASA/Plavix given DES x2 to her LAD and LCx 8/3 and TAVR last week. Hg dropped to 6.6 this AM. Continue to monitor closely.   Acute blood loss anemia: Her Hg had stabilized after ~18 PRBCs transfusions and two trips to the OR. Now hg down to 6.6. Got 1 unit this morning. I will order another unit now and recheck H/H.   Left groin hematoma: Post op she had a large hematoma with active bleeding resulting in hypovolemic shock requiring pressors and resuscitation with 18 units of pRBCs, platelets, cryo, FFP and closure of the artery in the OR on 9/11 and hematoma evacuation on 9/14.  Hg dropped again today. CT abdomen today shows a large hematoma in right abdominal wall (10 x 16cm) and a retroperitoneal hematoma in left lower pelvis (9x7x8cm). Her BP has remained stable. Dr. Trula Slade following.   Thrombocytopenia: PLTs improving 42--> 59--> 67--> 87--> 108--> 143  Acute respiratory failure/shock: now extubated and off all pressors   Acute on chronic diastolic CHF: up ~61 lbs from pre op. We are diuresing her with IV lasix and weight down 12 lbs over past couple days. Baseline weight ~246 lbs.   AKI: creatinine improving 2.48--> 1.91--> 1.75--> 1.43--> 1.49 --> 1.51 which is around her baseline.   CAD: she underwent DES to mLAD, PCTA to oDiag, and DES to LCx on 12/28/16. Continue DAPT with ASA/plavix for now.   Carotid artery disease: she will require R CEA after she recovers from TAVR  Severe physical debility w/ limited mobility: PT recommending SNF at discharge  Gout: uric acid level elevated. Being treated with colchicine.    Signed, Angelena Form, PA-C  02/13/2017, 9:39 AM   I have personally seen and examined this patient with Angelena Form, PA-C. I agree with the assessment and  plan as outlined above. H/H drop over last 24 hours with some output from drain. CT scan with abdominal wall and RP hematoma. She is hemodynamically stable. No active arterial or venous bleeding when Dr. Donnetta Hutching explored the wound last week. Dr. Trula Slade is following. Further plans regarding wound management/hematoma management per [facility]. 2 units pRBCs given today. Will leave in ICU today.   Lauree Chandler 02/13/2017 2:33 PM

## 2017-02-13 NOTE — Progress Notes (Signed)
CRITICAL VALUE ALERT  Critical Value:  Hgb 6.6  Date & Time Notied:  02/13/2017 0540  Provider Notified: Aundra Dubin, MD  Orders Received/Actions taken: transfuse 1 unit PRBC now.  Team will reassess after.

## 2017-02-13 NOTE — Progress Notes (Signed)
Subjective  -   No complaints this am   Physical Exam:  Incision with increased drainage, saturated gauze Palpable pedal pulses       Assessment/Plan:    Decrease in Hb overnight from 8.1 to 6.6.  ASA?Plavix re-started yesterday.  Will repeat non-contrast CT scan to see if there is a new/residual hematoma.  I would transfuse her.  I will follow up after CT scan  Uric acid levels elevated, on colchicine  Carrie Monroe, [place] 02/13/2017 7:54 AM --  Vitals:   02/13/17 0500 02/13/17 0600  BP: (!) 139/57 (!) 120/49  Pulse: 72 (!) 58  Resp: 19 19  Temp:    SpO2: 94% 93%    Intake/Output Summary (Last 24 hours) at 02/13/17 0754 Last data filed at 02/13/17 0600  Gross per 24 hour  Intake              780 ml  Output              950 ml  Net             -170 ml     Laboratory CBC    Component Value Date/Time   WBC 10.0 02/13/2017 0415   HGB 6.6 (LL) 02/13/2017 0415   HGB 10.1 (L) 12/27/2016 0831   HGB 11.2 (L) 02/23/2015 1029   HCT 19.9 (L) 02/13/2017 0415   HCT 29.6 (L) 12/27/2016 0831   HCT 33.6 (L) 02/23/2015 1029   PLT 143 (L) 02/13/2017 0415   PLT 145 (L) 12/27/2016 0831    BMET    Component Value Date/Time   NA 134 (L) 02/13/2017 0415   NA 140 01/04/2017 1101   NA 141 02/23/2015 1029   K 3.8 02/13/2017 0415   K 4.3 02/23/2015 1029   CL 104 02/13/2017 0415   CL 111 (H) 08/28/2012 1224   CO2 25 02/13/2017 0415   CO2 26 02/23/2015 1029   GLUCOSE 107 (H) 02/13/2017 0415   GLUCOSE 193 (H) 02/23/2015 1029   GLUCOSE 103 (H) 08/28/2012 1224   BUN 48 (H) 02/13/2017 0415   BUN 33 (H) 01/04/2017 1101   BUN 20.2 02/23/2015 1029   CREATININE 1.51 (H) 02/13/2017 0415   CREATININE 1.3 (H) 02/23/2015 1029   CALCIUM 8.4 (L) 02/13/2017 0415   CALCIUM 9.5 02/23/2015 1029   GFRNONAA 32 (L) 02/13/2017 0415   GFRAA 37 (L) 02/13/2017 0415    COAG Lab Results  Component Value Date   INR 1.12 02/08/2017   INR 1.38 02/05/2017   INR 1.60 02/05/2017   No  results found for: PTT  Antibiotics Anti-infectives    Start     Dose/Rate Route Frequency Ordered Stop   02/08/17 1015  ceFAZolin (ANCEF) IVPB 1 g/50 mL premix    Comments:  Send with pt to OR   1 g 100 mL/hr over 30 Minutes Intravenous On call 02/08/17 1010 02/08/17 1045   02/06/17 1800  cefUROXime (ZINACEF) 1.5 g in dextrose 5 % 50 mL IVPB     1.5 g 100 mL/hr over 30 Minutes Intravenous Every 12 hours 02/06/17 1005 02/07/17 0550   02/06/17 1015  cefUROXime (ZINACEF) 1.5 g in dextrose 5 % 50 mL IVPB  Status:  Discontinued     1.5 g 100 mL/hr over 30 Minutes Intravenous Every 12 hours 02/06/17 1001 02/06/17 1005   02/05/17 2300  vancomycin (VANCOCIN) IVPB 1000 mg/200 mL premix     1,000 mg 200 mL/hr over 60 Minutes Intravenous  Once  02/05/17 1516 02/06/17 0120   02/05/17 2000  cefUROXime (ZINACEF) 1.5 g in dextrose 5 % 50 mL IVPB  Status:  Discontinued     1.5 g 100 mL/hr over 30 Minutes Intravenous Every 12 hours 02/05/17 1032 02/05/17 1516   02/05/17 1900  cefUROXime (ZINACEF) 1.5 g in dextrose 5 % 50 mL IVPB  Status:  Discontinued     1.5 g 100 mL/hr over 30 Minutes Intravenous Every 12 hours 02/05/17 1516 02/06/17 1001   02/05/17 1800  vancomycin (VANCOCIN) IVPB 1000 mg/200 mL premix  Status:  Discontinued     1,000 mg 200 mL/hr over 60 Minutes Intravenous  Once 02/05/17 1032 02/05/17 1516   02/05/17 0400  vancomycin (VANCOCIN) 1,250 mg in sodium chloride 0.9 % 250 mL IVPB  Status:  Discontinued     1,250 mg 166.7 mL/hr over 90 Minutes Intravenous To Surgery 02/04/17 1352 02/05/17 1032   02/05/17 0400  cefUROXime (ZINACEF) 1.5 g in dextrose 5 % 50 mL IVPB     1.5 g 100 mL/hr over 30 Minutes Intravenous To Surgery 02/04/17 1352 02/05/17 0811       V. Leia Alf, M.D. [facility] Office: [phone] Pager:  [phone]

## 2017-02-13 NOTE — Progress Notes (Signed)
CT scan reviewed with left RP hematoma and right abd wall hematoma.  Would not recommend exploration at this time.  Keep in ICU for monitoring.    Carrie Monroe

## 2017-02-14 ENCOUNTER — Telehealth: Payer: Self-pay | Admitting: Cardiovascular Disease

## 2017-02-14 LAB — RENAL FUNCTION PANEL
ALBUMIN: 2 g/dL — AB (ref 3.5–5.0)
ANION GAP: 7 (ref 5–15)
BUN: 49 mg/dL — ABNORMAL HIGH (ref 6–20)
CALCIUM: 8.3 mg/dL — AB (ref 8.9–10.3)
CO2: 25 mmol/L (ref 22–32)
Chloride: 102 mmol/L (ref 101–111)
Creatinine, Ser: 1.43 mg/dL — ABNORMAL HIGH (ref 0.44–1.00)
GFR, EST AFRICAN AMERICAN: 40 mL/min — AB (ref >60)
GFR, EST NON AFRICAN AMERICAN: 34 mL/min — AB (ref >60)
GLUCOSE: 114 mg/dL — AB (ref 65–99)
PHOSPHORUS: 2.8 mg/dL (ref 2.5–4.6)
Potassium: 3.9 mmol/L (ref 3.5–5.1)
SODIUM: 134 mmol/L — AB (ref 135–145)

## 2017-02-14 LAB — TYPE AND SCREEN
ABO/RH(D): A POS
ANTIBODY SCREEN: NEGATIVE
UNIT DIVISION: 0
Unit division: 0

## 2017-02-14 LAB — BPAM RBC
BLOOD PRODUCT EXPIRATION DATE: 201809202359
BLOOD PRODUCT EXPIRATION DATE: 201809232359
ISSUE DATE / TIME: 201809190950
ISSUE DATE / TIME: 201809191246
UNIT TYPE AND RH: 600
Unit Type and Rh: 6200

## 2017-02-14 LAB — CBC WITH DIFFERENTIAL/PLATELET
BASOS ABS: 0 10*3/uL (ref 0.0–0.1)
Basophils Relative: 0 %
EOS PCT: 3 %
Eosinophils Absolute: 0.4 10*3/uL (ref 0.0–0.7)
HEMATOCRIT: 30 % — AB (ref 36.0–46.0)
Hemoglobin: 9.9 g/dL — ABNORMAL LOW (ref 12.0–15.0)
LYMPHS PCT: 8 %
Lymphs Abs: 1 10*3/uL (ref 0.7–4.0)
MCH: 29.1 pg (ref 26.0–34.0)
MCHC: 33 g/dL (ref 30.0–36.0)
MCV: 88.2 fL (ref 78.0–100.0)
MONO ABS: 1.3 10*3/uL — AB (ref 0.1–1.0)
MONOS PCT: 11 %
NEUTROS ABS: 9.1 10*3/uL — AB (ref 1.7–7.7)
Neutrophils Relative %: 78 %
PLATELETS: 154 10*3/uL (ref 150–400)
RBC: 3.4 MIL/uL — ABNORMAL LOW (ref 3.87–5.11)
RDW: 16.3 % — AB (ref 11.5–15.5)
WBC: 11.7 10*3/uL — ABNORMAL HIGH (ref 4.0–10.5)

## 2017-02-14 LAB — GLUCOSE, CAPILLARY
GLUCOSE-CAPILLARY: 113 mg/dL — AB (ref 65–99)
GLUCOSE-CAPILLARY: 95 mg/dL (ref 65–99)
GLUCOSE-CAPILLARY: 143 mg/dL — AB (ref 65–99)
GLUCOSE-CAPILLARY: 136 mg/dL — AB (ref 65–99)
Glucose-Capillary: 148 mg/dL — ABNORMAL HIGH (ref 65–99)
Glucose-Capillary: 166 mg/dL — ABNORMAL HIGH (ref 65–99)
Glucose-Capillary: 223 mg/dL — ABNORMAL HIGH (ref 65–99)

## 2017-02-14 LAB — MAGNESIUM: Magnesium: 1.6 mg/dL — ABNORMAL LOW (ref 1.7–2.4)

## 2017-02-14 MED ORDER — SORBITOL 70 % SOLN
30.0000 mL | Freq: Every day | Status: DC | PRN
  Administered 2017-02-14 – 2017-02-23 (×2): 30 mL via ORAL
  Filled 2017-02-14 (×4): qty 30

## 2017-02-14 MED ORDER — COLCHICINE 0.6 MG PO TABS
0.6000 mg | ORAL_TABLET | Freq: Two times a day (BID) | ORAL | Status: DC
  Administered 2017-02-14 – 2017-02-27 (×25): 0.6 mg via ORAL
  Filled 2017-02-14 (×25): qty 1

## 2017-02-14 MED ORDER — COLCHICINE 0.6 MG PO TABS
0.6000 mg | ORAL_TABLET | Freq: Three times a day (TID) | ORAL | Status: DC
  Administered 2017-02-14: 0.6 mg via ORAL
  Filled 2017-02-14: qty 1

## 2017-02-14 NOTE — Progress Notes (Signed)
[place] VALVE TEAM   Patient Name: Carrie Monroe Date of Encounter: 02/14/2017  Primary Cardiologist: Dr. [facility] Problem List     Active Problems:   S/P TAVR (transcatheter aortic valve replacement)   Acute respiratory failure with hypoxia (HCC)   Hemorrhagic shock (HCC)     Subjective   Feeling a lot better today, in much better spirits  Inpatient Medications    Scheduled Meds: . aspirin EC  81 mg Oral Daily  . clopidogrel  75 mg Oral Q breakfast  . docusate sodium  200 mg Oral BID  . ferrous [id number] C-folic acid  1 capsule Oral TID PC  . furosemide  40 mg Intravenous BID  . insulin aspart  0-24 Units Subcutaneous Q4H  . insulin detemir  28 Units Subcutaneous BID  . metoprolol tartrate  25 mg Oral BID  . rosuvastatin  5 mg Oral q1800  . sodium chloride flush  3 mL Intravenous Q12H   Continuous Infusions: . sodium chloride    . sodium chloride    . lactated ringers     PRN Meds: docusate sodium, fentaNYL (SUBLIMAZE) injection, Influenza vac split quadrivalent PF, lactated ringers, metoprolol tartrate, ondansetron (ZOFRAN) IV, oxyCODONE-acetaminophen, pneumococcal 23 valent vaccine, sodium chloride flush   Vital Signs    Vitals:   02/14/17 1002 02/14/17 1003 02/14/17 1004 02/14/17 1005  BP:    126/74  Pulse: 78 77 77 75  Resp: 19 20 18 18   Temp:      TempSrc:      SpO2: 96% 96% 96% 95%  Weight:      Height:        Intake/Output Summary (Last 24 hours) at 02/14/17 1130 Last data filed at 02/14/17 0900  Gross per 24 hour  Intake          1112.83 ml  Output             3170 ml  Net         -2057.17 ml   Filed Weights   02/12/17 0530 02/13/17 0500 02/14/17 0659  Weight: 274 lb 4 oz (124.4 kg) 266 lb 15.6 oz (121.1 kg) 260 lb 9.6 oz (118.2 kg)    Physical Exam   GEN: Well nourished, well developed, in no acute distress. Morbidly obese, sitting up in chair HEENT:  Grossly normal.  Neck: Supple, no JVD, carotid bruits, or masses. Cardiac: RRR, 2/6 soft murmur @RUSB , rubs, or gallops. No clubbing, cyanosis, edema.  Radials/DP/PT 2+ and equal bilaterally.  Respiratory:  Respirations regular and unlabored, clear to auscultation bilaterally. GI: Soft, nontender, nondistended, BS + x 4. MS: no deformity or atrophy. Skin: warm and dry, no rash. Improving, tender left groin hematoma with drain in place.  Neuro:  Strength and sensation are intact. Psych: AAOx3.  Normal affect.  Labs    CBC  Recent Labs  02/13/17 0415 02/13/17 1904 02/14/17 0300  WBC 10.0  --  11.7*  NEUTROABS 7.2  --  9.1*  HGB 6.6* 11.2* 9.9*  HCT 19.9* 33.8* 30.0*  MCV 90.5  --  88.2  PLT 143*  --  706   Basic Metabolic Panel  Recent Labs  02/13/17 0415 02/14/17 0300  NA 134* 134*  K 3.8 3.9  CL 104 102  CO2 25 25  GLUCOSE 107* 114*  BUN 48* 49*  CREATININE 1.51* 1.43*  CALCIUM 8.4* 8.3*  MG 1.7 1.6*  PHOS 3.1 2.8  Liver Function Tests  Recent Labs  02/12/17 0314 02/13/17 0415 02/14/17 0300  AST 26  --   --   ALT 21  --   --   ALKPHOS 78  --   --   BILITOT 1.5*  --   --   PROT 5.2*  --   --   ALBUMIN 2.2* 2.0* 2.0*   No results for input(s): LIPASE, AMYLASE in the last 72 hours. Cardiac Enzymes No results for input(s): CKTOTAL, CKMB, CKMBINDEX, TROPONINI in the last 72 hours. BNP Invalid input(s): POCBNP D-Dimer No results for input(s): DDIMER in the last 72 hours. Hemoglobin A1C No results for input(s): HGBA1C in the last 72 hours. Fasting Lipid Panel No results for input(s): CHOL, HDL, LDLCALC, TRIG, CHOLHDL, LDLDIRECT in the last 72 hours. Thyroid Function Tests No results for input(s): TSH, T4TOTAL, T3FREE, THYROIDAB in the last 72 hours.  Invalid input(s): FREET3  Telemetry    NSR, few PVCs - Personally Reviewed  ECG    NSR, LBBB - Personally Reviewed  Radiology    Ct Abdomen Pelvis Wo Contrast  Result Date:  02/13/2017 CLINICAL DATA:  Generalized abdominal pain. Acute anemia. Hematemesis. Postop day 7 transcatheter aortic valve replacement. Postop day 4 hematoma evacuation. Personal history of left upper outer quadrant breast cancer post lumpectomy in 2016. EXAM: CT ABDOMEN AND PELVIS WITHOUT CONTRAST TECHNIQUE: Multidetector CT imaging of the abdomen and pelvis was performed following the standard protocol without IV contrast. COMPARISON:  12/18/2016. FINDINGS: Lower chest: 4 mm nodule in the right middle lobe (series 5, image 9) as seen on the prior examination and on the CTA chest 01/10/2017. Small bilateral pleural effusions, a new finding. Scattered areas of hyperlucency in the lower lobes, unchanged. Lungs otherwise clear. Aortic valve prosthesis post TAVR. Dense mitral annular calcification. Right coronary and left circumflex coronary artery atherosclerosis. Very small, insignificant pericardial effusion. Hepatobiliary: Two calcified granulomas in the liver. No significant focal hepatic parenchymal abnormality, allowing for the unenhanced technique. Surgically absent gallbladder. No biliary ductal dilation. Pancreas: Normal unenhanced appearance. Spleen: Normal unenhanced appearance. Adrenals/Urinary Tract: Normal appearing adrenal glands. Benign cyst in the upper pole of the right kidney measuring approximately 2 cm. Nonobstructing very small calculi involving a lower pole calyx of the right kidney and mid calices of the left kidney. No visible ureteral calculi on either side. Within the limits of the unenhanced technique, no significant focal parenchymal abnormality involving either kidney. Urinary bladder decompressed and unremarkable. Stomach/Bowel: Stomach decompressed and unremarkable. Normal-appearing small bowel. Large stool burden throughout the colon from cecum to rectum. Scattered diverticula throughout the colon without evidence of acute diverticulitis. Very small decompressed appendix as noted  previously without evidence acute appendicitis. Vascular/Lymphatic: Aortoiliofemoral atherosclerosis without evidence of aneurysm. No pathologic lymphadenopathy. Reproductive: Normal appearing uterus. No adnexal masses. Calcification within the right ovary as noted previously. Other: Large hematoma in the right lateral abdominal wall measuring approximately 10 x 16 cm (best seen on sagittal series 7, image 2). Surgical drain in the left lateral abdominal wall without evidence of hematoma. Retroperitoneal hematoma involving the left lower pelvis adjacent to the urinary bladder measuring approximately 9 x 7 x 8 cm. Likely post radiation skin thickening and trabecular thickening involving the left breast. Musculoskeletal: Osseous demineralization. DISH involving the lower thoracic spine. Multilevel degenerative disc disease and facet degenerative changes throughout the lumbar spine. Degenerative changes involving the symphysis pubis. Moderate to severe multifactorial spinal stenosis at L4-5. IMPRESSION: 1. Large hematoma involving the right lateral abdominal wall measuring  approximately 10 x 16 cm. 2. Retroperitoneal hematoma involving the left lower pelvis measuring approximately 9 x 7 x 8 cm. 3. Surgical drain in the left lateral abdominal wall without evidence of hematoma. 4. 4 mm right middle lobe lung nodule as noted previously. Please see prior recommendations for follow-up. 5. Nonobstructing bilateral renal calculi. 6. Large colonic stool burden. Scattered colonic diverticula without evidence of acute diverticulitis. 7. Likely post radiation skin thickening and trabecular thickening involving the left breast. Electronically Signed   By: Evangeline Dakin M.D.   On: 02/13/2017 09:24    Cardiac Studies   2D ECHO: 02/06/2017 LV EF: 50% -55% Study Conclusions - Left ventricle: The cavity size was normal. There was mild   concentric hypertrophy. Systolic function was normal. The   estimated ejection fraction  was in the range of 50% to 55%. Wall   motion was normal; there were no regional wall motion   abnormalities. Doppler parameters are consistent with abnormal   left ventricular relaxation (grade 1 diastolic dysfunction). - Ventricular septum: Septal motion showed abnormal function and   dyssynergy. - Aortic valve: [employer] CoreValve Evolut Pro (size 82mm) was   functioning normally. Peak velocity (S): 156 cm/s. Mean gradient   (S): 5 mm Hg. Valve area (Vmax): 1.38 cm^2. - Mitral valve: Moderately calcified annulus. - Pericardium, extracardiac: There was a left pleural effusion.  CT abdomen 02/13/17 IMPRESSION: 1. Large hematoma involving the right lateral abdominal wall measuring approximately 10 x 16 cm. 2. Retroperitoneal hematoma involving the left lower pelvis measuring approximately 9 x 7 x 8 cm. 3. Surgical drain in the left lateral abdominal wall without evidence of hematoma. 4. 4 mm right middle lobe lung nodule as noted previously. Please see prior recommendations for follow-up. 5. Nonobstructing bilateral renal calculi. 6. Large colonic stool burden. Scattered colonic diverticula without evidence of acute diverticulitis. 7. Likely post radiation skin thickening and trabecular thickening involving the left breast  Patient Profile     81 y.o. female with history of chronic diastolic CHF, HTN, HLD, DM, gout, breast cancer, CAD, severe aortic stenosis and severe carotid artery disease who was admitted following TAVR on 02/05/17. Following the case she had an active bleed from her left groin that we were unable to control with manual hemostasis. She had a significant blood loss with shock and was taken to the OR on 9/11 for femoral artery repair. Her Hg dropped again and she then went back to the OR on 9/14 for evacuation of hematoma and Blake drain placement with primary closure.   Assessment & Plan    Severe AS: s/p successful TAVR with a 26 mm [employer] CoreValve Evolut Pro  via L TF approach on 02/05/17. Post operative echo 02/06/17 showed good valve placement with no PVL. ECG with new LBBB but no bradyarrhythmia. She has been resumed on her ASA/Plavix given DES x2 to her LAD and LCx 8/3 and TAVR last week. Hg dropped to 6.6 yesterday and she was transfused 2U PRBCs. Hg has remained stable. Continue to monitor.   Acute blood loss anemia: Her Hg had stabilized after ~18 PRBCs transfusions and two trips to the OR. Hg dropped to 6.6 yesterday and she was transfused 2U PRBCs. Hg 9.9 this AM.   Left groin hematoma: Post op she had a large hematoma with active bleeding resulting in hypovolemic shock requiring pressors and resuscitation with 18 units of pRBCs, platelets, cryo, FFP and closure of the artery in the OR on 9/11 and hematoma evacuation on  9/14. CT abdomen 9/19 showed a large hematoma in right abdominal wall (10 x 16cm) and a retroperitoneal hematoma in left lower pelvis (9x7x8cm). Her BP has remained stable. She was transfused and Dr. Trula Slade recommends continued monitoring for now. No role for surgical exploration at this point.   Thrombocytopenia: resolved.   Acute respiratory failure/shock: now extubated and off all pressors   Acute on chronic diastolic CHF: up ~31 lbs from pre op. We are diuresing her with IV lasix and weight down 14 lbs over past couple days. Baseline weight ~246 lbs.   AKI: creatinine now at her baseline 1.4-1.5   CAD: she underwent DES to mLAD, PCTA to oDiag, and DES to LCx on 12/28/16. Continue DAPT with ASA/plavix for now.   Carotid artery disease: she will require R CEA after she recovers from TAVR  Severe physical debility w/ limited mobility: PT recommending SNF at discharge  Gout: uric acid level elevated. Being treated with colchicine.    If Hg remains stable, we may think about transfer to floor tomorrow.   Signed, Angelena Form, PA-C  02/14/2017, 11:30 AM   I have personally seen and examined this patient with Angelena Form, PA-C I agree with the assessment and plan as outlined above. She is stable today. H/H stable after 2 units pRBCs yesterday. There does not appear to be active bleeding VVS following. Will continue to monitor in ICU today. She is back on ASA and Plavix.   Lauree Chandler 02/14/2017 9:40 PM'

## 2017-02-14 NOTE — Telephone Encounter (Signed)
I spoke with Arci at [employer] and told her pt's surgery was 9/11

## 2017-02-14 NOTE — Telephone Encounter (Signed)
New message   Arci calling from [employer] stating that she needs to verify surgery dates for this patient. Was it Sept 11th or Sept 12th for this year? Requests call back

## 2017-02-14 NOTE — Progress Notes (Signed)
Subjective  -   Received pRBC yesterday Feels better this am   Physical Exam:  Sitting up in chair Breathing well Dressing in place to left groin     Assessment/Plan:    Hb better this am after transfusion, will continue to monitor.  No role for exploration   Will need to better evaluate left groin once she is back in bed.  [name] 02/14/2017 8:13 AM --  Vitals:   02/14/17 0628 02/14/17 0808  BP: 132/61   Pulse: 72   Resp: (!) 21   Temp:  98.7 F (37.1 C)  SpO2: 95%     Intake/Output Summary (Last 24 hours) at 02/14/17 0813 Last data filed at 02/14/17 0600  Gross per 24 hour  Intake          1052.83 ml  Output             3670 ml  Net         -2617.17 ml     Laboratory CBC    Component Value Date/Time   WBC 11.7 (H) 02/14/2017 0300   HGB 9.9 (L) 02/14/2017 0300   HGB 10.1 (L) 12/27/2016 0831   HGB 11.2 (L) 02/23/2015 1029   HCT 30.0 (L) 02/14/2017 0300   HCT 29.6 (L) 12/27/2016 0831   HCT 33.6 (L) 02/23/2015 1029   PLT 154 02/14/2017 0300   PLT 145 (L) 12/27/2016 0831    BMET    Component Value Date/Time   NA 134 (L) 02/14/2017 0300   NA 140 01/04/2017 1101   NA 141 02/23/2015 1029   K 3.9 02/14/2017 0300   K 4.3 02/23/2015 1029   CL 102 02/14/2017 0300   CL 111 (H) 08/28/2012 1224   CO2 25 02/14/2017 0300   CO2 26 02/23/2015 1029   GLUCOSE 114 (H) 02/14/2017 0300   GLUCOSE 193 (H) 02/23/2015 1029   GLUCOSE 103 (H) 08/28/2012 1224   BUN 49 (H) 02/14/2017 0300   BUN 33 (H) 01/04/2017 1101   BUN 20.2 02/23/2015 1029   CREATININE 1.43 (H) 02/14/2017 0300   CREATININE 1.3 (H) 02/23/2015 1029   CALCIUM 8.3 (L) 02/14/2017 0300   CALCIUM 9.5 02/23/2015 1029   GFRNONAA 34 (L) 02/14/2017 0300   GFRAA 40 (L) 02/14/2017 0300    COAG Lab Results  Component Value Date   INR 1.12 02/08/2017   INR 1.38 02/05/2017   INR 1.60 02/05/2017   No results found for: PTT  Antibiotics Anti-infectives    Start     Dose/Rate Route  Frequency Ordered Stop   02/08/17 1015  ceFAZolin (ANCEF) IVPB 1 g/50 mL premix    Comments:  Send with pt to OR   1 g 100 mL/hr over 30 Minutes Intravenous On call 02/08/17 1010 02/08/17 1045   02/06/17 1800  cefUROXime (ZINACEF) 1.5 g in dextrose 5 % 50 mL IVPB     1.5 g 100 mL/hr over 30 Minutes Intravenous Every 12 hours 02/06/17 1005 02/07/17 0550   02/06/17 1015  cefUROXime (ZINACEF) 1.5 g in dextrose 5 % 50 mL IVPB  Status:  Discontinued     1.5 g 100 mL/hr over 30 Minutes Intravenous Every 12 hours 02/06/17 1001 02/06/17 1005   02/05/17 2300  vancomycin (VANCOCIN) IVPB 1000 mg/200 mL premix     1,000 mg 200 mL/hr over 60 Minutes Intravenous  Once 02/05/17 1516 02/06/17 0120   02/05/17 2000  cefUROXime (ZINACEF) 1.5 g in dextrose 5 % 50 mL IVPB  Status:  Discontinued     1.5 g 100 mL/hr over 30 Minutes Intravenous Every 12 hours 02/05/17 1032 02/05/17 1516   02/05/17 1900  cefUROXime (ZINACEF) 1.5 g in dextrose 5 % 50 mL IVPB  Status:  Discontinued     1.5 g 100 mL/hr over 30 Minutes Intravenous Every 12 hours 02/05/17 1516 02/06/17 1001   02/05/17 1800  vancomycin (VANCOCIN) IVPB 1000 mg/200 mL premix  Status:  Discontinued     1,000 mg 200 mL/hr over 60 Minutes Intravenous  Once 02/05/17 1032 02/05/17 1516   02/05/17 0400  vancomycin (VANCOCIN) 1,250 mg in sodium chloride 0.9 % 250 mL IVPB  Status:  Discontinued     1,250 mg 166.7 mL/hr over 90 Minutes Intravenous To Surgery 02/04/17 1352 02/05/17 1032   02/05/17 0400  cefUROXime (ZINACEF) 1.5 g in dextrose 5 % 50 mL IVPB     1.5 g 100 mL/hr over 30 Minutes Intravenous To Surgery 02/04/17 1352 02/05/17 0811       V. Leia Alf, M.D. [facility] Office: [phone] Pager:  [phone]

## 2017-02-14 NOTE — Progress Notes (Signed)
Physical Therapy Treatment Patient Details Name: Carrie Monroe MRN: [id number] DOB: 1936-03-21 Today's Date: 02/14/2017    History of Present Illness 81 y.o. female with history of chronic diastolic CHF, HTN, HLD, DM, gout, breast cancer, CAD, severe aortic stenosis and severe carotid artery disease who was admitted following TAVR on 02/05/17. Following the case she had an active bleed from her left groin that we were unable to control with manual hemostasis. She had a significant blood loss with shock and was taken to the OR on 9/11 for femoral artery repair. Her Hg dropped again and she then went back to the OR on 9/14 for evacuation of hematoma and Blake drain placement with primary closure.     PT Comments    Patient required mod A +2 for sit to stand and stand pivot transfers this session with use of EVA walker.  Pt ambulated ~88ft to transfer from [facility] to EOB. Pt reported sitting up in recliner since 6 am and fatigued. Continue to progress as tolerated.   Follow Up Recommendations  SNF     Equipment Recommendations  Other (comment) (to be determined at next venue)    Recommendations for Other Services       Precautions / Restrictions Precautions Precautions: None Restrictions Weight Bearing Restrictions: No    Mobility  Bed Mobility Overal bed mobility: Needs Assistance Bed Mobility: Sit to Supine;Rolling Rolling: Mod assist     Sit to supine: Mod assist;+2 for physical assistance   General bed mobility comments: pt on BSC upon arrival with RN present and reported pt had been sitting up in recliner since 6 am; pt required assist to lower trunk and bring bilat LE into bed as well as roll toward L side for pericare with cues for sequencing and pt using bed rail to maintain sidelying  Transfers Overall transfer level: Needs assistance Equipment used:  (EVA walker) Transfers: Sit to/from [facility] Sit to Stand: Mod assist Stand pivot transfers: Min  assist;+2 physical assistance       General transfer comment: assist to power up into standing and to steady upon stand with cues for hand placement; pt with heavy reliance on bilat UE support in standing  Ambulation/Gait Ambulation/Gait assistance: Min assist;+2 safety/equipment Ambulation Distance (Feet): 2 Feet Assistive device:  (EVA walker) Gait Pattern/deviations: Step-to pattern;Decreased step length - left;Decreased stance time - right;Antalgic;Shuffle Gait velocity: slowed   General Gait Details: assist for balance and to steady EVA walker when turning as pt was relying heavily on UE support; cues for posture; pt was able to take forward and side steps to get to EOB   Stairs            Wheelchair Mobility    Modified Rankin (Stroke Patients Only)       Balance Overall balance assessment: Needs assistance Sitting-balance support: Feet supported;Single extremity supported Sitting balance-Leahy Scale: Fair     Standing balance support: Bilateral upper extremity supported Standing balance-Leahy Scale: Poor                              Cognition Arousal/Alertness: Awake/alert Behavior During Therapy: Flat affect Overall Cognitive Status: Within Functional Limits for tasks assessed                                        Exercises  General Comments        Pertinent Vitals/Pain Pain Assessment: Faces Faces Pain Scale: Hurts little more Pain Location: "all over" Pain Descriptors / Indicators: Sore;Grimacing Pain Intervention(s): Limited activity within patient's tolerance;Monitored during session;Repositioned    Home Living                      Prior Function            PT Goals (current goals can now be found in the care plan section) Acute Rehab PT Goals PT Goal Formulation: With patient Time For Goal Achievement: 02/25/17 Potential to Achieve Goals: Fair Progress towards PT goals: Progressing toward  goals    Frequency    Min 3X/week      PT Plan Current plan remains appropriate    Co-evaluation              AM-PAC PT "6 Clicks" Daily Activity  Outcome Measure  Difficulty turning over in bed (including adjusting bedclothes, sheets and blankets)?: Unable Difficulty moving from lying on back to sitting on the side of the bed? : Unable Difficulty sitting down on and standing up from a chair with arms (e.g., wheelchair, bedside commode, etc,.)?: Unable Help needed moving to and from a bed to chair (including a wheelchair)?: A Little Help needed walking in hospital room?: A Lot Help needed climbing 3-5 steps with a railing? : A Lot 6 Click Score: 10    End of Session Equipment Utilized During Treatment: Gait belt Activity Tolerance: Patient limited by fatigue Patient left: with call bell/phone within reach;in bed;with nursing/sitter in room Nurse Communication: Mobility status PT Visit Diagnosis: Unsteadiness on feet (R26.81);Other abnormalities of gait and mobility (R26.89);History of falling (Z91.81);Muscle weakness (generalized) (M62.81);Difficulty in walking, not elsewhere classified (R26.2);Pain Pain - Right/Left: Right Pain - part of body: Ankle and joints of foot     Time: 1440-1454 PT Time Calculation (min) (ACUTE ONLY): 14 min  Charges:  $Therapeutic Activity: 8-22 mins                    G Codes:       Earney Navy, PTA Pager: [phone]     Darliss Cheney 02/14/2017, 4:07 PM

## 2017-02-15 LAB — GLUCOSE, CAPILLARY
GLUCOSE-CAPILLARY: 101 mg/dL — AB (ref 65–99)
GLUCOSE-CAPILLARY: 119 mg/dL — AB (ref 65–99)
GLUCOSE-CAPILLARY: 93 mg/dL (ref 65–99)
Glucose-Capillary: 139 mg/dL — ABNORMAL HIGH (ref 65–99)
Glucose-Capillary: 143 mg/dL — ABNORMAL HIGH (ref 65–99)
Glucose-Capillary: 196 mg/dL — ABNORMAL HIGH (ref 65–99)

## 2017-02-15 LAB — CBC WITH DIFFERENTIAL/PLATELET
Basophils Absolute: 0 10*3/uL (ref 0.0–0.1)
Basophils Relative: 0 %
Eosinophils Absolute: 0.3 10*3/uL (ref 0.0–0.7)
Eosinophils Relative: 3 %
HEMATOCRIT: 30.3 % — AB (ref 36.0–46.0)
HEMOGLOBIN: 9.8 g/dL — AB (ref 12.0–15.0)
LYMPHS ABS: 1.2 10*3/uL (ref 0.7–4.0)
LYMPHS PCT: 9 %
MCH: 28.7 pg (ref 26.0–34.0)
MCHC: 32.3 g/dL (ref 30.0–36.0)
MCV: 88.6 fL (ref 78.0–100.0)
MONOS PCT: 10 %
Monocytes Absolute: 1.3 10*3/uL — ABNORMAL HIGH (ref 0.1–1.0)
NEUTROS ABS: 9.9 10*3/uL — AB (ref 1.7–7.7)
Neutrophils Relative %: 78 %
Platelets: 177 10*3/uL (ref 150–400)
RBC: 3.42 MIL/uL — AB (ref 3.87–5.11)
RDW: 16.1 % — ABNORMAL HIGH (ref 11.5–15.5)
WBC: 12.7 10*3/uL — ABNORMAL HIGH (ref 4.0–10.5)

## 2017-02-15 LAB — RENAL FUNCTION PANEL
ANION GAP: 7 (ref 5–15)
Albumin: 2 g/dL — ABNORMAL LOW (ref 3.5–5.0)
BUN: 51 mg/dL — ABNORMAL HIGH (ref 6–20)
CO2: 26 mmol/L (ref 22–32)
Calcium: 8.5 mg/dL — ABNORMAL LOW (ref 8.9–10.3)
Chloride: 101 mmol/L (ref 101–111)
Creatinine, Ser: 1.4 mg/dL — ABNORMAL HIGH (ref 0.44–1.00)
GFR, EST AFRICAN AMERICAN: 41 mL/min — AB (ref >60)
GFR, EST NON AFRICAN AMERICAN: 35 mL/min — AB (ref >60)
Glucose, Bld: 92 mg/dL (ref 65–99)
Phosphorus: 3.1 mg/dL (ref 2.5–4.6)
Potassium: 3.6 mmol/L (ref 3.5–5.1)
Sodium: 134 mmol/L — ABNORMAL LOW (ref 135–145)

## 2017-02-15 LAB — MAGNESIUM: Magnesium: 1.7 mg/dL (ref 1.7–2.4)

## 2017-02-15 MED ORDER — INSULIN ASPART 100 UNIT/ML ~~LOC~~ SOLN
0.0000 [IU] | Freq: Three times a day (TID) | SUBCUTANEOUS | Status: DC
  Administered 2017-02-15: 4 [IU] via SUBCUTANEOUS
  Administered 2017-02-15 – 2017-02-16 (×3): 2 [IU] via SUBCUTANEOUS
  Administered 2017-02-17: 4 [IU] via SUBCUTANEOUS
  Administered 2017-02-18 (×2): 2 [IU] via SUBCUTANEOUS
  Administered 2017-02-18: 4 [IU] via SUBCUTANEOUS
  Administered 2017-02-19: 2 [IU] via SUBCUTANEOUS
  Administered 2017-02-19: 4 [IU] via SUBCUTANEOUS
  Administered 2017-02-19: 8 [IU] via SUBCUTANEOUS
  Administered 2017-02-20 – 2017-02-22 (×5): 2 [IU] via SUBCUTANEOUS
  Administered 2017-02-22 – 2017-02-23 (×4): 4 [IU] via SUBCUTANEOUS
  Administered 2017-02-23: 2 [IU] via SUBCUTANEOUS
  Administered 2017-02-23: 4 [IU] via SUBCUTANEOUS
  Administered 2017-02-24: 2 [IU] via SUBCUTANEOUS
  Administered 2017-02-24: 4 [IU] via SUBCUTANEOUS
  Administered 2017-02-24 – 2017-02-27 (×6): 2 [IU] via SUBCUTANEOUS

## 2017-02-15 MED ORDER — POTASSIUM CHLORIDE 10 MEQ/100ML IV SOLN
10.0000 meq | INTRAVENOUS | Status: AC
  Administered 2017-02-15 (×3): 10 meq via INTRAVENOUS
  Filled 2017-02-15 (×3): qty 100

## 2017-02-15 MED ORDER — GERHARDT'S BUTT CREAM
TOPICAL_CREAM | Freq: Two times a day (BID) | CUTANEOUS | Status: DC
  Administered 2017-02-15 – 2017-02-19 (×10): via TOPICAL
  Administered 2017-02-20: 1 via TOPICAL
  Administered 2017-02-20 – 2017-02-27 (×13): via TOPICAL
  Filled 2017-02-15 (×2): qty 1

## 2017-02-15 MED ORDER — ACETAMINOPHEN 325 MG PO TABS
650.0000 mg | ORAL_TABLET | Freq: Four times a day (QID) | ORAL | Status: AC | PRN
  Administered 2017-02-15 – 2017-02-16 (×2): 650 mg via ORAL
  Filled 2017-02-15 (×2): qty 2

## 2017-02-15 NOTE — Progress Notes (Signed)
CSW provided patient with current list of bed offers- no official choice at this time  CSW will continue to follow  Jorge Ny, [place] Social Worker [phone]

## 2017-02-15 NOTE — Progress Notes (Signed)
Pt received from [facility] RN. Pt oriented to room and equipment. VSS. Telemetry applied, CCMD notified x2. Dinner tray re-routed. JP drain to wall suction - per 2H RN VVS team to come assess JP drain and assess need for JP to suction versus wound vac.  Fritz Pickerel, RN

## 2017-02-15 NOTE — Progress Notes (Signed)
Spoke with Dr. Trula Slade. JP drain to be left to continuous suction. Careful charting overnight of JP drainage to assess whether to continue suction or not in the morning.   Please leave patient in the bed in the morning so that Dr. Trula Slade can do the dressing change. Abdominal pads, 4x4, and tape placed at bedside for dressing change in the morning.  Fritz Pickerel, RN

## 2017-02-15 NOTE — Progress Notes (Signed)
[place] VALVE TEAM   Patient Name: Carrie Monroe Date of Encounter: 02/15/2017  Primary Cardiologist: Dr. [facility] Problem List     Active Problems:   S/P TAVR (transcatheter aortic valve replacement)   Acute respiratory failure with hypoxia (HCC)   Hemorrhagic shock (HCC)     Subjective   No complaints. Ankle pain improving. Groin pain improving. Overall feeling a lot better  Inpatient Medications    Scheduled Meds: . aspirin EC  81 mg Oral Daily  . clopidogrel  75 mg Oral Q breakfast  . colchicine  0.6 mg Oral BID  . docusate sodium  200 mg Oral BID  . ferrous [id number] C-folic acid  1 capsule Oral TID PC  . furosemide  40 mg Intravenous BID  . insulin aspart  0-24 Units Subcutaneous Q4H  . insulin detemir  28 Units Subcutaneous BID  . metoprolol tartrate  25 mg Oral BID  . rosuvastatin  5 mg Oral q1800  . sodium chloride flush  3 mL Intravenous Q12H   Continuous Infusions: . sodium chloride    . sodium chloride    . lactated ringers    . potassium chloride 10 mEq (02/15/17 0811)   PRN Meds: docusate sodium, fentaNYL (SUBLIMAZE) injection, Influenza vac split quadrivalent PF, lactated ringers, metoprolol tartrate, ondansetron (ZOFRAN) IV, oxyCODONE-acetaminophen, pneumococcal 23 valent vaccine, sodium chloride flush, sorbitol   Vital Signs    Vitals:   02/15/17 0600 02/15/17 0700 02/15/17 0800 02/15/17 0826  BP: 136/62 (!) 121/59 133/60   Pulse: 67 64 61   Resp: 20 (!) 21 18   Temp:    98.2 F (36.8 C)  TempSrc:    Oral  SpO2: 94% 97% 97%   Weight:      Height:        Intake/Output Summary (Last 24 hours) at 02/15/17 0953 Last data filed at 02/15/17 0800  Gross per 24 hour  Intake              460 ml  Output             3450 ml  Net            -2990 ml   Filed Weights   02/13/17 0500 02/14/17 0659 02/15/17 0500  Weight: 266 lb 15.6 oz (121.1 kg) 260 lb 9.6 oz  (118.2 kg) 254 lb 3.1 oz (115.3 kg)    Physical Exam   GEN: Well nourished, well developed, in no acute distress. Morbidly obese, sitting up in chair HEENT: Grossly normal.  Neck: Supple, no JVD, carotid bruits, or masses. Cardiac: RRR, 2/6 soft murmur @RUSB , rubs, or gallops. No clubbing, cyanosis, edema.  Radials/DP/PT 2+ and equal bilaterally.  Respiratory:  Respirations regular and unlabored, clear to auscultation bilaterally. GI: Soft, nontender, nondistended, BS + x 4. MS: no deformity or atrophy. Skin: warm and dry, no rash. Improving, tender left groin hematoma with drain in place.  Neuro:  Strength and sensation are intact. Psych: AAOx3.  Normal affect.  Labs    CBC  Recent Labs  02/14/17 0300 02/15/17 0258  WBC 11.7* 12.7*  NEUTROABS 9.1* 9.9*  HGB 9.9* 9.8*  HCT 30.0* 30.3*  MCV 88.2 88.6  PLT 154 174   Basic Metabolic Panel  Recent Labs  02/14/17 0300 02/15/17 0258  NA 134* 134*  K 3.9 3.6  CL 102 101  CO2 25 26  GLUCOSE 114* 92  BUN 49* 51*  CREATININE 1.43* 1.40*  CALCIUM 8.3* 8.5*  MG 1.6* 1.7  PHOS 2.8 3.1   Liver Function Tests  Recent Labs  02/14/17 0300 02/15/17 0258  ALBUMIN 2.0* 2.0*   No results for input(s): LIPASE, AMYLASE in the last 72 hours. Cardiac Enzymes No results for input(s): CKTOTAL, CKMB, CKMBINDEX, TROPONINI in the last 72 hours. BNP Invalid input(s): POCBNP D-Dimer No results for input(s): DDIMER in the last 72 hours. Hemoglobin A1C No results for input(s): HGBA1C in the last 72 hours. Fasting Lipid Panel No results for input(s): CHOL, HDL, LDLCALC, TRIG, CHOLHDL, LDLDIRECT in the last 72 hours. Thyroid Function Tests No results for input(s): TSH, T4TOTAL, T3FREE, THYROIDAB in the last 72 hours.  Invalid input(s): FREET3  Telemetry    NSR, few PVCs - Personally Reviewed  ECG    NSR, LBBB - Personally Reviewed  Radiology    No results found.  Cardiac Studies   2D ECHO: 02/06/2017 LV EF: 50%  -55% Study Conclusions - Left ventricle: The cavity size was normal. There was mild   concentric hypertrophy. Systolic function was normal. The   estimated ejection fraction was in the range of 50% to 55%. Wall   motion was normal; there were no regional wall motion   abnormalities. Doppler parameters are consistent with abnormal   left ventricular relaxation (grade 1 diastolic dysfunction). - Ventricular septum: Septal motion showed abnormal function and   dyssynergy. - Aortic valve: Medtronic CoreValve Evolut Pro (size 19mm) was   functioning normally. Peak velocity (S): 156 cm/s. Mean gradient   (S): 5 mm Hg. Valve area (Vmax): 1.38 cm^2. - Mitral valve: Moderately calcified annulus. - Pericardium, extracardiac: There was a left pleural effusion.  CT abdomen 02/13/17 IMPRESSION: 1. Large hematoma involving the right lateral abdominal wall measuring approximately 10 x 16 cm. 2. Retroperitoneal hematoma involving the left lower pelvis measuring approximately 9 x 7 x 8 cm. 3. Surgical drain in the left lateral abdominal wall without evidence of hematoma. 4. 4 mm right middle lobe lung nodule as noted previously. Please see prior recommendations for follow-up. 5. Nonobstructing bilateral renal calculi. 6. Large colonic stool burden. Scattered colonic diverticula without evidence of acute diverticulitis. 7. Likely post radiation skin thickening and trabecular thickening involving the left breast  Patient Profile     81 y.o. female with history of chronic diastolic CHF, HTN, HLD, DM, gout, breast cancer, CAD, severe aortic stenosis and severe carotid artery disease who was admitted following TAVR on 02/05/17. Following the case she had an active bleed from her left groin that we were unable to control with manual hemostasis. She had a significant blood loss with shock and was taken to the OR on 9/11 for femoral artery repair. Her Hg dropped again and she then went back to the OR on 9/14  for evacuation of hematoma and Blake drain placement with primary closure.   Assessment & Plan    Severe AS: s/p successful TAVR with a 26 mm Medtronic CoreValve Evolut Pro via L TF approach on 02/05/17. Post operative echo 02/06/17 showed good valve placement with no PVL. ECG with new LBBB but no bradyarrhythmia. She has been resumed on her ASA/Plavix given DES x2 to her LAD and LCx 8/3 and TAVR last week.  Acute blood loss anemia: Her Hg had stabilized after ~18 PRBCs transfusions and two trips to the OR. Hg dropped to 6.6 on 9/19 and she was transfused 2U PRBCs. Hg has remained stable since that time. Continue to monitor.  Left groin hematoma: Post op she had a large hematoma with active bleeding resulting in hypovolemic shock requiring pressors and resuscitation with 18 units of pRBCs, platelets, cryo, FFP and closure of the artery in the OR on 9/11 and hematoma evacuation on 9/14. CT abdomen 9/19 showed a large hematoma in right abdominal wall (10 x 16cm) and a retroperitoneal hematoma in left lower pelvis (9x7x8cm). Her BP has remained stable. She was transfused and Dr. Trula Slade recommends continued monitoring for now. No role for surgical exploration at this point.   Thrombocytopenia: resolved.   Acute respiratory failure/shock: now extubated and off all pressors   Acute on chronic diastolic CHF: she was up ~16 lbs from pre op. We are diuresing her with IV lasix and weight coming down nicely. Baseline weight ~246 lbs.   AKI: creatinine now at her baseline 1.4-1.5   CAD: she underwent DES to mLAD, PCTA to oDiag, and DES to LCx on 12/28/16. Continue DAPT with ASA/plavix for now.   Carotid artery disease: she will require R CEA after she recovers from TAVR  Severe physical debility w/ limited mobility: PT recommending SNF at discharge  L ankle Gout: uric acid level elevated. Responding well to colchicine    I will transfer her to tele.   Signed, Angelena Form, PA-C  02/15/2017, 9:53  AM   I have personally seen and examined this patient with Angelena Form, PA-C. I agree with the assessment and plan as outlined above. Hgb is stable. She has no complaints. Continue aSA and Plavix. Transfer to tele. VVS following wound/drain.   Lauree Chandler 02/15/2017 10:29 AM

## 2017-02-15 NOTE — Progress Notes (Signed)
Physical Therapy Treatment Patient Details Name: Carrie Monroe MRN: [id number] DOB: 1935-06-14 Today's Date: 02/15/2017    History of Present Illness 81 y.o. female with history of chronic diastolic CHF, HTN, HLD, DM, gout, breast cancer, CAD, severe aortic stenosis and severe carotid artery disease who was admitted following TAVR on 02/05/17. Following the case she had an active bleed from her left groin that we were unable to control with manual hemostasis. She had a significant blood loss with shock and was taken to the OR on 9/11 for femoral artery repair. Her Hg dropped again and she then went back to the OR on 9/14 for evacuation of hematoma and Blake drain placement with primary closure.     PT Comments    Pt is agreeable to walking today and says that her R ankle is feeling much better. Pt currently maxA for bed mobility to manage LE into bed, minA for transfers and minA for ambulation of 290 feet with EVA walker. Pt requires skilled PT to continue to progress ambulation and to improve strength and endurance to safely navigate in her discharge environment.    Follow Up Recommendations  SNF     Equipment Recommendations  Other (comment) (to be determined at next venue)    Recommendations for Other Services       Precautions / Restrictions Precautions Precautions: None Restrictions Weight Bearing Restrictions: No    Mobility  Bed Mobility Overal bed mobility: Needs Assistance Bed Mobility: Supine to Sit Rolling: Max assist     Sit to supine: Max assist   General bed mobility comments: maxA for management of LE against gravity into bed  Transfers Overall transfer level: Needs assistance Equipment used:  (EVA walker) Transfers: Sit to/from Stand Sit to Stand: Min assist;+2 physical assistance         General transfer comment: minA for power up into standing and for reaching to EVA walker, vc for hand placement for power up  Ambulation/Gait Ambulation/Gait  assistance: Min assist;+2 safety/equipment Ambulation Distance (Feet): 290 Feet Assistive device:  (EVA walker) Gait Pattern/deviations: Shuffle;Step-through pattern Gait velocity: slowed Gait velocity interpretation: Below normal speed for age/gender General Gait Details: assist for balance and to steady EVA walker when turning as pt was relying heavily on UE support; cues for posture      Balance Overall balance assessment: Needs assistance Sitting-balance support: Feet supported;Single extremity supported Sitting balance-Leahy Scale: Fair     Standing balance support: Bilateral upper extremity supported Standing balance-Leahy Scale: Poor                              Cognition Arousal/Alertness: Awake/alert Behavior During Therapy: WFL for tasks assessed/performed Overall Cognitive Status: Within Functional Limits for tasks assessed                                           General Comments General comments (skin integrity, edema, etc.): VSS      Pertinent Vitals/Pain Pain Assessment: Faces Faces Pain Scale: Hurts a little bit Pain Location: "all over" Pain Descriptors / Indicators: Sore;Grimacing Pain Intervention(s): Limited activity within patient's tolerance;Monitored during session;Repositioned           PT Goals (current goals can now be found in the care plan section) Acute Rehab PT Goals PT Goal Formulation: With patient Time For Goal Achievement: 02/25/17 Potential  to Achieve Goals: Fair Progress towards PT goals: Progressing toward goals    Frequency    Min 3X/week      PT Plan Current plan remains appropriate       AM-PAC PT "6 Clicks" Daily Activity  Outcome Measure  Difficulty turning over in bed (including adjusting bedclothes, sheets and blankets)?: Unable Difficulty moving from lying on back to sitting on the side of the bed? : Unable Difficulty sitting down on and standing up from a chair with arms (e.g.,  wheelchair, bedside commode, etc,.)?: Unable Help needed moving to and from a bed to chair (including a wheelchair)?: A Little Help needed walking in hospital room?: A Little Help needed climbing 3-5 steps with a railing? : A Lot 6 Click Score: 11    End of Session Equipment Utilized During Treatment: Gait belt Activity Tolerance: Patient tolerated treatment well Patient left: with call bell/phone within reach;in bed;with nursing/sitter in room Nurse Communication: Mobility status PT Visit Diagnosis: Unsteadiness on feet (R26.81);Other abnormalities of gait and mobility (R26.89);History of falling (Z91.81);Muscle weakness (generalized) (M62.81);Difficulty in walking, not elsewhere classified (R26.2)     Time: 1035-1100 PT Time Calculation (min) (ACUTE ONLY): 25 min  Charges:  $Gait Training: 8-22 mins $Therapeutic Activity: 8-22 mins                    G Codes:       Carrie Monroe PT, DPT Acute Rehabilitation  [phone] Pager [phone]     [place] 02/15/2017, 2:22 PM

## 2017-02-16 LAB — RENAL FUNCTION PANEL
ALBUMIN: 2.1 g/dL — AB (ref 3.5–5.0)
ANION GAP: 8 (ref 5–15)
BUN: 55 mg/dL — ABNORMAL HIGH (ref 6–20)
CO2: 25 mmol/L (ref 22–32)
Calcium: 8.6 mg/dL — ABNORMAL LOW (ref 8.9–10.3)
Chloride: 103 mmol/L (ref 101–111)
Creatinine, Ser: 1.53 mg/dL — ABNORMAL HIGH (ref 0.44–1.00)
GFR calc Af Amer: 36 mL/min — ABNORMAL LOW (ref >60)
GFR calc non Af Amer: 31 mL/min — ABNORMAL LOW (ref >60)
GLUCOSE: 88 mg/dL (ref 65–99)
PHOSPHORUS: 3.6 mg/dL (ref 2.5–4.6)
POTASSIUM: 4.4 mmol/L (ref 3.5–5.1)
Sodium: 136 mmol/L (ref 135–145)

## 2017-02-16 LAB — GLUCOSE, CAPILLARY
GLUCOSE-CAPILLARY: 85 mg/dL (ref 65–99)
GLUCOSE-CAPILLARY: 125 mg/dL — AB (ref 65–99)
Glucose-Capillary: 132 mg/dL — ABNORMAL HIGH (ref 65–99)
Glucose-Capillary: 95 mg/dL (ref 65–99)

## 2017-02-16 LAB — CBC WITH DIFFERENTIAL/PLATELET
BASOS ABS: 0 10*3/uL (ref 0.0–0.1)
Basophils Relative: 0 %
Eosinophils Absolute: 0.3 10*3/uL (ref 0.0–0.7)
Eosinophils Relative: 2 %
HEMATOCRIT: 32 % — AB (ref 36.0–46.0)
HEMOGLOBIN: 10.1 g/dL — AB (ref 12.0–15.0)
LYMPHS PCT: 9 %
Lymphs Abs: 1.1 10*3/uL (ref 0.7–4.0)
MCH: 28.5 pg (ref 26.0–34.0)
MCHC: 31.6 g/dL (ref 30.0–36.0)
MCV: 90.1 fL (ref 78.0–100.0)
MONO ABS: 1.1 10*3/uL — AB (ref 0.1–1.0)
Monocytes Relative: 9 %
NEUTROS ABS: 10 10*3/uL — AB (ref 1.7–7.7)
NEUTROS PCT: 80 %
Platelets: 198 10*3/uL (ref 150–400)
RBC: 3.55 MIL/uL — AB (ref 3.87–5.11)
RDW: 16.1 % — AB (ref 11.5–15.5)
WBC: 12.6 10*3/uL — ABNORMAL HIGH (ref 4.0–10.5)

## 2017-02-16 LAB — MAGNESIUM: Magnesium: 1.8 mg/dL (ref 1.7–2.4)

## 2017-02-16 NOTE — Progress Notes (Signed)
Progress Note  Patient Name: Carrie Monroe Date of Encounter: 02/16/2017  Primary Cardiologist: Ofilia Neas  Subjective   81 year old female with a history of aortic stenosis-status post recent TAVR. Large left groin hematoma and retroperitoneal bleed. She's had postoperative anemia requiring 18 units packed red blood cells.  Inpatient Medications    Scheduled Meds: . aspirin EC  81 mg Oral Daily  . clopidogrel  75 mg Oral Q breakfast  . colchicine  0.6 mg Oral BID  . docusate sodium  200 mg Oral BID  . ferrous [id number] C-folic acid  1 capsule Oral TID PC  . furosemide  40 mg Intravenous BID  . Gerhardt's butt cream   Topical BID  . insulin aspart  0-24 Units Subcutaneous TID AC & HS  . insulin detemir  28 Units Subcutaneous BID  . metoprolol tartrate  25 mg Oral BID  . rosuvastatin  5 mg Oral q1800  . sodium chloride flush  3 mL Intravenous Q12H   Continuous Infusions: . sodium chloride    . sodium chloride    . lactated ringers     PRN Meds: docusate sodium, fentaNYL (SUBLIMAZE) injection, Influenza vac split quadrivalent PF, lactated ringers, metoprolol tartrate, ondansetron (ZOFRAN) IV, oxyCODONE-acetaminophen, pneumococcal 23 valent vaccine, sodium chloride flush, sorbitol   Vital Signs    Vitals:   02/15/17 2000 02/16/17 0400 02/16/17 0501 02/16/17 1045  BP: (!) 152/58 (!) 132/58  (!) 176/67  Pulse: 71 63  75  Resp: (!) 21 20    Temp: 99.3 F (37.4 C) 98.4 F (36.9 C)    TempSrc: Oral Oral    SpO2: 95% 96%    Weight:   245 lb 2.4 oz (111.2 kg)   Height:        Intake/Output Summary (Last 24 hours) at 02/16/17 1155 Last data filed at 02/16/17 1046  Gross per 24 hour  Intake              243 ml  Output              815 ml  Net             -572 ml   Filed Weights   02/14/17 0659 02/15/17 0500 02/16/17 0501  Weight: 260 lb 9.6 oz (118.2 kg) 254 lb 3.1 oz (115.3 kg) 245 lb 2.4 oz (111.2 kg)    Telemetry    NSR , BBB  -  Personally Reviewed  ECG       NSR at 65. LBBB - Personally Reviewed  Physical Exam   GEN:  elderly , obese, white female. She appears to be chronically ill.   Neck: No JVD Cardiac: RRR, soft systolic murmur. Respiratory:  decreased breath sounds bilaterally. GI: Soft, nontender, non-distended  MS:  there is a drain in her left femoral area. Hematoma is dressed and will be addressed by Dr. Trula Slade Neuro:  Nonfocal  Psych: Normal affect   Labs    Chemistry Recent Labs Lab 02/12/17 0314  02/14/17 0300 02/15/17 0258 02/16/17 0433  NA 137  < > 134* 134* 136  K 4.0  < > 3.9 3.6 4.4  CL 107  < > 102 101 103  CO2 24  < > 25 26 25   GLUCOSE 117*  < > 114* 92 88  BUN 44*  < > 49* 51* 55*  CREATININE 1.49*  < > 1.43* 1.40* 1.53*  CALCIUM 8.6*  < > 8.3* 8.5* 8.6*  PROT 5.2*  --   --   --   --  ALBUMIN 2.2*  < > 2.0* 2.0* 2.1*  AST 26  --   --   --   --   ALT 21  --   --   --   --   ALKPHOS 78  --   --   --   --   BILITOT 1.5*  --   --   --   --   GFRNONAA 32*  < > 34* 35* 31*  GFRAA 38*  < > 40* 41* 36*  ANIONGAP 6  < > 7 7 8   < > = values in this interval not displayed.   Hematology Recent Labs Lab 02/14/17 0300 02/15/17 0258 02/16/17 0433  WBC 11.7* 12.7* 12.6*  RBC 3.40* 3.42* 3.55*  HGB 9.9* 9.8* 10.1*  HCT 30.0* 30.3* 32.0*  MCV 88.2 88.6 90.1  MCH 29.1 28.7 28.5  MCHC 33.0 32.3 31.6  RDW 16.3* 16.1* 16.1*  PLT 154 177 198    Cardiac EnzymesNo results for input(s): TROPONINI in the last 168 hours. No results for input(s): TROPIPOC in the last 168 hours.   BNPNo results for input(s): BNP, PROBNP in the last 168 hours.   DDimer No results for input(s): DDIMER in the last 168 hours.   Radiology    No results found.  Cardiac Studies     Patient Profile     81 y.o. female with aortic stenosis. She status post TAVR   Assessment & Plan    1. Severe aortic stenosis-status post TAVR with 26 mm Medtronic Evolut Pro via left femoral artery. The  procedure was complicated by new left bundle branch block. She's not had any heart block.  2. Coronary artery disease: The patient is status post stenting to LAD and left circumflex artery. Continue aspirin and Plavix  3. Large groin hematoma: She's had extensive bleeding and has required 18 units of packed red blood cells. Followed by vascular surgery  4. Acute on chronic diastolic congestive heart failure: Her ins and outs are negative at 12.3 L throughout this admission. Weight is currently 245 pounds.   For questions or updates, please contact [place] Please consult [email or web address] for contact info under Cardiology/STEMI.      Signed, Mertie Moores, MD  02/16/2017, 11:55 AM

## 2017-02-16 NOTE — Progress Notes (Signed)
Subjective  -  Feels better today   Physical Exam:  Left groin dressing changed. Significant fluid from wound     Assessment/Plan:    Will place wound vac to left groin to help keep area dry.  May need operative exploration of wound if it decompensates. Likely d/c JP tomorrow  [name] 02/16/2017 1:10 PM --  Vitals:   02/16/17 0400 02/16/17 1045  BP: (!) 132/58 (!) 176/67  Pulse: 63 75  Resp: 20   Temp: 98.4 F (36.9 C)   SpO2: 96%     Intake/Output Summary (Last 24 hours) at 02/16/17 1310 Last data filed at 02/16/17 1046  Gross per 24 hour  Intake                3 ml  Output             1665 ml  Net            -1662 ml     Laboratory CBC    Component Value Date/Time   WBC 12.6 (H) 02/16/2017 0433   HGB 10.1 (L) 02/16/2017 0433   HGB 10.1 (L) 12/27/2016 0831   HGB 11.2 (L) 02/23/2015 1029   HCT 32.0 (L) 02/16/2017 0433   HCT 29.6 (L) 12/27/2016 0831   HCT 33.6 (L) 02/23/2015 1029   PLT 198 02/16/2017 0433   PLT 145 (L) 12/27/2016 0831    BMET    Component Value Date/Time   NA 136 02/16/2017 0433   NA 140 01/04/2017 1101   NA 141 02/23/2015 1029   K 4.4 02/16/2017 0433   K 4.3 02/23/2015 1029   CL 103 02/16/2017 0433   CL 111 (H) 08/28/2012 1224   CO2 25 02/16/2017 0433   CO2 26 02/23/2015 1029   GLUCOSE 88 02/16/2017 0433   GLUCOSE 193 (H) 02/23/2015 1029   GLUCOSE 103 (H) 08/28/2012 1224   BUN 55 (H) 02/16/2017 0433   BUN 33 (H) 01/04/2017 1101   BUN 20.2 02/23/2015 1029   CREATININE 1.53 (H) 02/16/2017 0433   CREATININE 1.3 (H) 02/23/2015 1029   CALCIUM 8.6 (L) 02/16/2017 0433   CALCIUM 9.5 02/23/2015 1029   GFRNONAA 31 (L) 02/16/2017 0433   GFRAA 36 (L) 02/16/2017 0433    COAG Lab Results  Component Value Date   INR 1.12 02/08/2017   INR 1.38 02/05/2017   INR 1.60 02/05/2017   No results found for: PTT  Antibiotics Anti-infectives    Start     Dose/Rate Route Frequency Ordered Stop   02/08/17 1015  ceFAZolin  (ANCEF) IVPB 1 g/50 mL premix    Comments:  Send with pt to OR   1 g 100 mL/hr over 30 Minutes Intravenous On call 02/08/17 1010 02/08/17 1045   02/06/17 1800  cefUROXime (ZINACEF) 1.5 g in dextrose 5 % 50 mL IVPB     1.5 g 100 mL/hr over 30 Minutes Intravenous Every 12 hours 02/06/17 1005 02/07/17 0550   02/06/17 1015  cefUROXime (ZINACEF) 1.5 g in dextrose 5 % 50 mL IVPB  Status:  Discontinued     1.5 g 100 mL/hr over 30 Minutes Intravenous Every 12 hours 02/06/17 1001 02/06/17 1005   02/05/17 2300  vancomycin (VANCOCIN) IVPB 1000 mg/200 mL premix     1,000 mg 200 mL/hr over 60 Minutes Intravenous  Once 02/05/17 1516 02/06/17 0120   02/05/17 2000  cefUROXime (ZINACEF) 1.5 g in dextrose 5 % 50 mL IVPB  Status:  Discontinued  1.5 g 100 mL/hr over 30 Minutes Intravenous Every 12 hours 02/05/17 1032 02/05/17 1516   02/05/17 1900  cefUROXime (ZINACEF) 1.5 g in dextrose 5 % 50 mL IVPB  Status:  Discontinued     1.5 g 100 mL/hr over 30 Minutes Intravenous Every 12 hours 02/05/17 1516 02/06/17 1001   02/05/17 1800  vancomycin (VANCOCIN) IVPB 1000 mg/200 mL premix  Status:  Discontinued     1,000 mg 200 mL/hr over 60 Minutes Intravenous  Once 02/05/17 1032 02/05/17 1516   02/05/17 0400  vancomycin (VANCOCIN) 1,250 mg in sodium chloride 0.9 % 250 mL IVPB  Status:  Discontinued     1,250 mg 166.7 mL/hr over 90 Minutes Intravenous To Surgery 02/04/17 1352 02/05/17 1032   02/05/17 0400  cefUROXime (ZINACEF) 1.5 g in dextrose 5 % 50 mL IVPB     1.5 g 100 mL/hr over 30 Minutes Intravenous To Surgery 02/04/17 1352 02/05/17 0811       V. Leia Alf, M.D. [facility] Office: [phone] Pager:  [phone]

## 2017-02-17 LAB — CBC WITH DIFFERENTIAL/PLATELET
Basophils Absolute: 0 10*3/uL (ref 0.0–0.1)
Basophils Relative: 0 %
Eosinophils Absolute: 0.2 10*3/uL (ref 0.0–0.7)
Eosinophils Relative: 2 %
HEMATOCRIT: 30.7 % — AB (ref 36.0–46.0)
HEMOGLOBIN: 10.1 g/dL — AB (ref 12.0–15.0)
LYMPHS ABS: 1 10*3/uL (ref 0.7–4.0)
LYMPHS PCT: 11 %
MCH: 29.6 pg (ref 26.0–34.0)
MCHC: 32.9 g/dL (ref 30.0–36.0)
MCV: 90 fL (ref 78.0–100.0)
MONOS PCT: 13 %
Monocytes Absolute: 1.1 10*3/uL — ABNORMAL HIGH (ref 0.1–1.0)
NEUTROS ABS: 6.7 10*3/uL (ref 1.7–7.7)
NEUTROS PCT: 74 %
Platelets: 208 10*3/uL (ref 150–400)
RBC: 3.41 MIL/uL — ABNORMAL LOW (ref 3.87–5.11)
RDW: 15.9 % — ABNORMAL HIGH (ref 11.5–15.5)
WBC: 9 10*3/uL (ref 4.0–10.5)

## 2017-02-17 LAB — RENAL FUNCTION PANEL
ANION GAP: 7 (ref 5–15)
Albumin: 2 g/dL — ABNORMAL LOW (ref 3.5–5.0)
BUN: 47 mg/dL — ABNORMAL HIGH (ref 6–20)
CHLORIDE: 100 mmol/L — AB (ref 101–111)
CO2: 26 mmol/L (ref 22–32)
Calcium: 8.5 mg/dL — ABNORMAL LOW (ref 8.9–10.3)
Creatinine, Ser: 1.42 mg/dL — ABNORMAL HIGH (ref 0.44–1.00)
GFR calc non Af Amer: 34 mL/min — ABNORMAL LOW (ref >60)
GFR, EST AFRICAN AMERICAN: 40 mL/min — AB (ref >60)
Glucose, Bld: 79 mg/dL (ref 65–99)
Phosphorus: 3.6 mg/dL (ref 2.5–4.6)
Potassium: 3.8 mmol/L (ref 3.5–5.1)
Sodium: 133 mmol/L — ABNORMAL LOW (ref 135–145)

## 2017-02-17 LAB — GLUCOSE, CAPILLARY
GLUCOSE-CAPILLARY: 87 mg/dL (ref 65–99)
GLUCOSE-CAPILLARY: 96 mg/dL (ref 65–99)
GLUCOSE-CAPILLARY: 168 mg/dL — AB (ref 65–99)
Glucose-Capillary: 104 mg/dL — ABNORMAL HIGH (ref 65–99)

## 2017-02-17 LAB — MAGNESIUM: Magnesium: 1.9 mg/dL (ref 1.7–2.4)

## 2017-02-17 NOTE — Progress Notes (Signed)
Progress Note  Patient Name: Carrie Monroe Date of Encounter: 02/17/2017  Primary Cardiologist: Harlene Salts   Subjective   81 year old female with a history of aortic stenosis-status post recent TAVR. Large left groin hematoma and retroperitoneal bleed. She's had postoperative anemia requiring 18 units packed red blood cells.  Her JP drain is still draining quite a bit.   Inpatient Medications    Scheduled Meds: . aspirin EC  81 mg Oral Daily  . clopidogrel  75 mg Oral Q breakfast  . colchicine  0.6 mg Oral BID  . docusate sodium  200 mg Oral BID  . ferrous [id number] C-folic acid  1 capsule Oral TID PC  . furosemide  40 mg Intravenous BID  . Gerhardt's butt cream   Topical BID  . insulin aspart  0-24 Units Subcutaneous TID AC & HS  . insulin detemir  28 Units Subcutaneous BID  . metoprolol tartrate  25 mg Oral BID  . rosuvastatin  5 mg Oral q1800  . sodium chloride flush  3 mL Intravenous Q12H   Continuous Infusions: . sodium chloride    . sodium chloride    . lactated ringers     PRN Meds: docusate sodium, fentaNYL (SUBLIMAZE) injection, Influenza vac split quadrivalent PF, lactated ringers, metoprolol tartrate, ondansetron (ZOFRAN) IV, oxyCODONE-acetaminophen, pneumococcal 23 valent vaccine, sodium chloride flush, sorbitol   Vital Signs    Vitals:   02/16/17 2221 02/17/17 0000 02/17/17 0400 02/17/17 0700  BP: (!) 140/50 (!) 129/56 (!) 133/49   Pulse: 68 (!) 56 60 64  Resp:  19 (!) 21 19  Temp:   99.2 F (37.3 C)   TempSrc:   Oral   SpO2:  95% 93% 95%  Weight:   254 lb 1.6 oz (115.3 kg)   Height:        Intake/Output Summary (Last 24 hours) at 02/17/17 1055 Last data filed at 02/17/17 0700  Gross per 24 hour  Intake              270 ml  Output                0 ml  Net              270 ml   Filed Weights   02/15/17 0500 02/16/17 0501 02/17/17 0400  Weight: 254 lb 3.1 oz (115.3 kg) 245 lb 2.4 oz (111.2 kg) 254 lb 1.6 oz (115.3  kg)    Telemetry    NSR - Personally Reviewed  ECG     NSR  - Personally Reviewed  Physical Exam   GEN:  elderly female, appears fatigued, chronically ill   Neck: No JVD Cardiac: RRR,  Soft systolic murmur  Respiratory: Clear to auscultation bilaterally. GI: Soft, nontender, non-distended  MS: JP drain in left femoral area . Neuro:  Nonfocal  Psych: Normal affect   Labs    Chemistry Recent Labs Lab 02/12/17 0314  02/15/17 0258 02/16/17 0433 02/17/17 0423  NA 137  < > 134* 136 133*  K 4.0  < > 3.6 4.4 3.8  CL 107  < > 101 103 100*  CO2 24  < > 26 25 26   GLUCOSE 117*  < > 92 88 79  BUN 44*  < > 51* 55* 47*  CREATININE 1.49*  < > 1.40* 1.53* 1.42*  CALCIUM 8.6*  < > 8.5* 8.6* 8.5*  PROT 5.2*  --   --   --   --   ALBUMIN  2.2*  < > 2.0* 2.1* 2.0*  AST 26  --   --   --   --   ALT 21  --   --   --   --   ALKPHOS 78  --   --   --   --   BILITOT 1.5*  --   --   --   --   GFRNONAA 32*  < > 35* 31* 34*  GFRAA 38*  < > 41* 36* 40*  ANIONGAP 6  < > 7 8 7   < > = values in this interval not displayed.   Hematology Recent Labs Lab 02/15/17 0258 02/16/17 0433 02/17/17 0423  WBC 12.7* 12.6* 9.0  RBC 3.42* 3.55* 3.41*  HGB 9.8* 10.1* 10.1*  HCT 30.3* 32.0* 30.7*  MCV 88.6 90.1 90.0  MCH 28.7 28.5 29.6  MCHC 32.3 31.6 32.9  RDW 16.1* 16.1* 15.9*  PLT 177 198 208    Cardiac EnzymesNo results for input(s): TROPONINI in the last 168 hours. No results for input(s): TROPIPOC in the last 168 hours.   BNPNo results for input(s): BNP, PROBNP in the last 168 hours.   DDimer No results for input(s): DDIMER in the last 168 hours.   Radiology    No results found.  Cardiac Studies     Patient Profile     81 y.o. female s/p TAVR   Assessment & Plan    1. Aortic stenosis:  Status post T aVR with 26 mm Medtronic Evolut Pro via the left femoral artery. The procedure was competent by a new left bundle branch block and large left femoral hematoma and retroperitoneal  bleed. She's required multiple transfusions. He seems to be fairly stable at this point  2. Coronary artery disease: She status post stenting of her LAD and left circumflex proximal artery. Continue aspirin and Plavix.  3. Acute on chronic diastolic congestive heart failure. Continue current medications.  For questions or updates, please contact [place] Please consult [email or web address] for contact info under Cardiology/STEMI.      Signed, Mertie Moores, MD  02/17/2017, 10:55 AM

## 2017-02-18 LAB — CBC WITH DIFFERENTIAL/PLATELET
BASOS ABS: 0 10*3/uL (ref 0.0–0.1)
BASOS PCT: 0 %
Eosinophils Absolute: 0.1 10*3/uL (ref 0.0–0.7)
Eosinophils Relative: 1 %
HEMATOCRIT: 30.5 % — AB (ref 36.0–46.0)
HEMOGLOBIN: 9.8 g/dL — AB (ref 12.0–15.0)
LYMPHS PCT: 8 %
Lymphs Abs: 0.9 10*3/uL (ref 0.7–4.0)
MCH: 28.7 pg (ref 26.0–34.0)
MCHC: 32.1 g/dL (ref 30.0–36.0)
MCV: 89.2 fL (ref 78.0–100.0)
Monocytes Absolute: 1.3 10*3/uL — ABNORMAL HIGH (ref 0.1–1.0)
Monocytes Relative: 12 %
NEUTROS ABS: 8.4 10*3/uL — AB (ref 1.7–7.7)
NEUTROS PCT: 79 %
Platelets: 231 10*3/uL (ref 150–400)
RBC: 3.42 MIL/uL — AB (ref 3.87–5.11)
RDW: 15.8 % — AB (ref 11.5–15.5)
WBC: 10.7 10*3/uL — AB (ref 4.0–10.5)

## 2017-02-18 LAB — RENAL FUNCTION PANEL
ALBUMIN: 2.1 g/dL — AB (ref 3.5–5.0)
Anion gap: 8 (ref 5–15)
BUN: 47 mg/dL — AB (ref 6–20)
CO2: 26 mmol/L (ref 22–32)
Calcium: 8.5 mg/dL — ABNORMAL LOW (ref 8.9–10.3)
Chloride: 98 mmol/L — ABNORMAL LOW (ref 101–111)
Creatinine, Ser: 1.5 mg/dL — ABNORMAL HIGH (ref 0.44–1.00)
GFR calc Af Amer: 37 mL/min — ABNORMAL LOW (ref >60)
GFR, EST NON AFRICAN AMERICAN: 32 mL/min — AB (ref >60)
GLUCOSE: 99 mg/dL (ref 65–99)
PHOSPHORUS: 3.9 mg/dL (ref 2.5–4.6)
POTASSIUM: 3.7 mmol/L (ref 3.5–5.1)
SODIUM: 132 mmol/L — AB (ref 135–145)

## 2017-02-18 LAB — GLUCOSE, CAPILLARY
GLUCOSE-CAPILLARY: 144 mg/dL — AB (ref 65–99)
GLUCOSE-CAPILLARY: 174 mg/dL — AB (ref 65–99)
Glucose-Capillary: 93 mg/dL (ref 65–99)
Glucose-Capillary: 130 mg/dL — ABNORMAL HIGH (ref 65–99)

## 2017-02-18 LAB — MAGNESIUM: Magnesium: 1.8 mg/dL (ref 1.7–2.4)

## 2017-02-18 NOTE — Progress Notes (Signed)
CARDIAC REHAB PHASE I   PRE:  Rate/Rhythm: 76 SR  BP:  Supine:   Sitting: 107/53  Standing:    SaO2: 100%RA  MODE:  Ambulation: 160 ft   POST:  Rate/Rhythm: 107 ST  BP:  Supine:   Sitting: 134/41  Standing:    SaO2: 94%RA 1010-1037 Disconnected JP from suction to walk. Pt used rolling walker on RA with gait belt use and asst x 2. Wound vac also. Pt able to walk 160 ft with assistance. Tired by end of walk but did not want to stop and rest. Wanted to get back to room. Did not need BSC. To recliner and attached suction back to JP. Tolerated well. Call light given.   Graylon Good, RN BSN  02/18/2017 10:33 AM

## 2017-02-18 NOTE — Progress Notes (Addendum)
[facility]  Subjective  - Resting well.   Objective (!) 152/59 77 98.6 F (37 C) (Oral) 19 93%  Intake/Output Summary (Last 24 hours) at 02/18/17 0716 Last data filed at 02/18/17 0600  Gross per 24 hour  Intake              170 ml  Output              820 ml  Net             -650 ml    Left groin JP in place 20 cc, wound vac in place 30 cc out put   Assessment/Planning: Left groin hematoma status post repair of left common femoral artery afterTAVR procedure on 02/05/2017  Will make sure out put totals are correct in Computer before D/C JP drain.  Increase in WBC 10.7 with Tm 101.3  COLLINS, EMMA MAUREEN 02/18/2017 7:16 AM --  Laboratory Lab Results:  Recent Labs  02/17/17 0423 02/18/17 0210  WBC 9.0 10.7*  HGB 10.1* 9.8*  HCT 30.7* 30.5*  PLT 208 231   BMET  Recent Labs  02/17/17 0423 02/18/17 0210  NA 133* 132*  K 3.8 3.7  CL 100* 98*  CO2 26 26  GLUCOSE 79 99  BUN 47* 47*  CREATININE 1.42* 1.50*  CALCIUM 8.5* 8.5*    COAG Lab Results  Component Value Date   INR 1.12 02/08/2017   INR 1.38 02/05/2017   INR 1.60 02/05/2017   No results found for: PTT  I agree with the above.  I have seen and evaluated the patient.  The left groin wound VAC is now in place.  If the JP drain his numbers are accurate, I will remove the JP drain today.  We will keep a close watch on her groin incision.  It is possible that this will need to be opened up.  In addition, we will need to monitor her fever curve and white blood cell count as she is at risk for infection within her hematoma.  [name]

## 2017-02-18 NOTE — Progress Notes (Signed)
[facility] VALVE TEAM   Patient Name: Carrie Monroe Date of Encounter: 02/18/2017  Primary Cardiologist: Dr. [facility] Problem List     Active Problems:   S/P TAVR (transcatheter aortic valve replacement)   Acute respiratory failure with hypoxia (HCC)   Hemorrhagic shock (HCC)     Subjective   Up walking with cardiac rehab. Feeling a little stronger.   Inpatient Medications    Scheduled Meds: . aspirin EC  81 mg Oral Daily  . clopidogrel  75 mg Oral Q breakfast  . colchicine  0.6 mg Oral BID  . docusate sodium  200 mg Oral BID  . ferrous [id number] C-folic acid  1 capsule Oral TID PC  . furosemide  40 mg Intravenous BID  . Gerhardt's butt cream   Topical BID  . insulin aspart  0-24 Units Subcutaneous TID AC & HS  . insulin detemir  28 Units Subcutaneous BID  . metoprolol tartrate  25 mg Oral BID  . rosuvastatin  5 mg Oral q1800  . sodium chloride flush  3 mL Intravenous Q12H   Continuous Infusions: . sodium chloride    . sodium chloride    . lactated ringers     PRN Meds: docusate sodium, fentaNYL (SUBLIMAZE) injection, Influenza vac split quadrivalent PF, lactated ringers, metoprolol tartrate, ondansetron (ZOFRAN) IV, oxyCODONE-acetaminophen, pneumococcal 23 valent vaccine, sodium chloride flush, sorbitol   Vital Signs    Vitals:   02/17/17 2300 02/18/17 0500 02/18/17 0603 02/18/17 0800  BP: (!) 127/53  (!) 152/59 (!) 148/63  Pulse: 80  77 73  Resp:   19 19  Temp: 99 F (37.2 C)  98.6 F (37 C)   TempSrc: Oral  Oral   SpO2:   93% 93%  Weight:  249 lb 5 oz (113.1 kg)    Height:        Intake/Output Summary (Last 24 hours) at 02/18/17 1004 Last data filed at 02/18/17 0600  Gross per 24 hour  Intake              170 ml  Output              820 ml  Net             -650 ml   Filed Weights   02/16/17 0501 02/17/17 0400 02/18/17 0500  Weight: 245 lb 2.4 oz (111.2 kg) 254 lb  1.6 oz (115.3 kg) 249 lb 5 oz (113.1 kg)    Physical Exam   GEN: Well nourished, well developed, in no acute distress. Morbidly obese, sitting up in chair HEENT: Grossly normal.  Neck: Supple, no JVD, carotid bruits, or masses. Cardiac: RRR, 2/6 soft murmur @RUSB , rubs, or gallops. No clubbing, cyanosis, edema.  Radials/DP/PT 2+ and equal bilaterally.  Respiratory:  Respirations regular and unlabored, clear to auscultation bilaterally. GI: Soft, nontender, nondistended, BS + x 4. MS: no deformity or atrophy. Skin: warm and dry, no rash. Improving, tender left groin hematoma with drain and wound vac in place.  Neuro:  Strength and sensation are intact. Psych: AAOx3.  Normal affect.  Labs    CBC  Recent Labs  02/17/17 0423 02/18/17 0210  WBC 9.0 10.7*  NEUTROABS 6.7 8.4*  HGB 10.1* 9.8*  HCT 30.7* 30.5*  MCV 90.0 89.2  PLT 208 503   Basic Metabolic Panel  Recent Labs  02/17/17 0423 02/18/17 0210  NA 133* 132*  K 3.8 3.7  CL  100* 98*  CO2 26 26  GLUCOSE 79 99  BUN 47* 47*  CREATININE 1.42* 1.50*  CALCIUM 8.5* 8.5*  MG 1.9 1.8  PHOS 3.6 3.9   Liver Function Tests  Recent Labs  02/17/17 0423 02/18/17 0210  ALBUMIN 2.0* 2.1*   No results for input(s): LIPASE, AMYLASE in the last 72 hours. Cardiac Enzymes No results for input(s): CKTOTAL, CKMB, CKMBINDEX, TROPONINI in the last 72 hours. BNP Invalid input(s): POCBNP D-Dimer No results for input(s): DDIMER in the last 72 hours. Hemoglobin A1C No results for input(s): HGBA1C in the last 72 hours. Fasting Lipid Panel No results for input(s): CHOL, HDL, LDLCALC, TRIG, CHOLHDL, LDLDIRECT in the last 72 hours. Thyroid Function Tests No results for input(s): TSH, T4TOTAL, T3FREE, THYROIDAB in the last 72 hours.  Invalid input(s): FREET3  Telemetry    NSR, few PACs- Personally Reviewed  ECG    NSR, LBBB - Personally Reviewed  Radiology    No results found.  Cardiac Studies   2D ECHO:  02/06/2017 LV EF: 50% -55% Study Conclusions - Left ventricle: The cavity size was normal. There was mild   concentric hypertrophy. Systolic function was normal. The   estimated ejection fraction was in the range of 50% to 55%. Wall   motion was normal; there were no regional wall motion   abnormalities. Doppler parameters are consistent with abnormal   left ventricular relaxation (grade 1 diastolic dysfunction). - Ventricular septum: Septal motion showed abnormal function and   dyssynergy. - Aortic valve: Medtronic CoreValve Evolut Pro (size 49mm) was   functioning normally. Peak velocity (S): 156 cm/s. Mean gradient   (S): 5 mm Hg. Valve area (Vmax): 1.38 cm^2. - Mitral valve: Moderately calcified annulus. - Pericardium, extracardiac: There was a left pleural effusion.  CT abdomen 02/13/17 IMPRESSION: 1. Large hematoma involving the right lateral abdominal wall measuring approximately 10 x 16 cm. 2. Retroperitoneal hematoma involving the left lower pelvis measuring approximately 9 x 7 x 8 cm. 3. Surgical drain in the left lateral abdominal wall without evidence of hematoma. 4. 4 mm right middle lobe lung nodule as noted previously. Please see prior recommendations for follow-up. 5. Nonobstructing bilateral renal calculi. 6. Large colonic stool burden. Scattered colonic diverticula without evidence of acute diverticulitis. 7. Likely post radiation skin thickening and trabecular thickening involving the left breast  Patient Profile     81 y.o. female with history of chronic diastolic CHF, HTN, HLD, DM, gout, breast cancer, CAD, severe aortic stenosis and severe carotid artery disease who was admitted following TAVR on 02/05/17. Following the case she had an active bleed from her left groin that we were unable to control with manual hemostasis. She had a significant blood loss with shock and was taken to the OR on 9/11 for femoral artery repair. Her Hg dropped again and she then went  back to the OR on 9/14 for evacuation of hematoma and Blake drain placement with primary closure.   Assessment & Plan    Severe AS: s/p successful TAVR with a 26 mm Medtronic CoreValve Evolut Pro via L TF approach on 02/05/17. Post operative echo 02/06/17 showed good valve placement with no PVL. ECG with new LBBB but no bradyarrhythmia. She has been resumed on her ASA/Plavix given DES x2 to her LAD and LCx 8/3 and TAVR  Acute blood loss anemia: Her Hg had stabilized after ~18 PRBCs transfusions and two trips to the OR. Hg dropped to 6.6 on 9/19 and she was transfused 2U  PRBCs. Hg has remained stable since that time. Continue to monitor.   Left groin hematoma: Post op she had a large hematoma with active bleeding resulting in hypovolemic shock requiring pressors and resuscitation with 18 units of pRBCs, platelets, cryo, FFP and closure of the artery in the OR on 9/11 and hematoma evacuation on 9/14. CT abdomen 9/19 showed a large abdominal hematoma and retroperitoneal hematoma. Her BP has remained stable. She was transfused and Dr. Trula Slade recommends continued monitoring for now. She has an increased white count and fever up to 101.3. JP drain and wound vac in place. Per Dr. Trula Slade "It is possible that this will need to be opened up.  In addition, we will need to monitor her fever curve and white blood cell count as she is at risk for infection within her hematoma."  Thrombocytopenia: resolved.   Acute respiratory failure/shock: now extubated and off all pressors   Acute on chronic diastolic CHF: she was up ~46 lbs from pre op. We are diuresing her with IV lasix and weight coming down nicely. She is close to her baseline weight ~246 lbs.   AKI: creatinine now at her baseline 1.4-1.5   CAD: she underwent DES to mLAD, PCTA to oDiag, and DES to LCx on 12/28/16. Continue DAPT with ASA/plavix for now.   Carotid artery disease: she will require R CEA after she recovers from TAVR  Severe physical  debility w/ limited mobility: PT recommending SNF at discharge  L ankle Gout: uric acid level elevated.  Treated with colchicine    Signed, Angelena Form, PA-C  02/18/2017, 10:04 AM   I have personally seen and examined this patient. I agree with the assessment and plan as outlined above. Carrie Monroe is doing well from a cardiac standpoint. She is on ASA and Plavix given recent TAVR and recent drug eluting coronary stent. VVS following wound. Afebrile this afternoon. Repeat WBC count in am.   Lauree Chandler 02/18/2017 8:25 PM

## 2017-02-18 NOTE — Progress Notes (Signed)
Patient with no output from JP drain for 12 hours. JP drain connected to wall suction; suction increased without any results. Gerri Lins, PA on unit at this time and notified of the above findings. Attempted to remove JP drain with PA at bedside, but the tubing is under the wound vac dressing. Unable to remove JP drain without decreasing the suction/effectiveness of wound vac. Will leave in until tomorrow.   Joellen Jersey, RN

## 2017-02-18 NOTE — Care Management Important Message (Signed)
Important Message  Patient Details  Name: Carrie Monroe MRN: [id number] Date of Birth: [birth year]   Medicare Important Message Given:  Yes    Mega Kinkade Abena 02/18/2017, 10:11 AM

## 2017-02-18 NOTE — Consult Note (Signed)
WOC consulted for incisional NPWT, this was placed 02/16/17 per VVS team.  Updated NPWT orders to reflect incisional type NPWT.  Would not need routine dressing changes.  Normally weekly unless removed earlier per VVS team for assessment.   [facility] nurse will follow along [place], [place], [facility]

## 2017-02-18 NOTE — Progress Notes (Signed)
Physical Therapy Treatment Patient Details Name: Carrie Monroe MRN: [id number] DOB: Jan 20, 1936 Today's Date: 02/18/2017    History of Present Illness 81 y.o. female with history of chronic diastolic CHF, HTN, HLD, DM, gout, breast cancer, CAD, severe aortic stenosis and severe carotid artery disease who was admitted following TAVR on 02/05/17. Following the case she had an active bleed from her left groin that we were unable to control with manual hemostasis. She had a significant blood loss with shock and was taken to the OR on 9/11 for femoral artery repair. Her Hg dropped again and she then went back to the OR on 9/14 for evacuation of hematoma and Blake drain placement with primary closure.     PT Comments    Patient tolerated ambulating 284ft with min guard/min A and RW. Pt required 2 standing rest breaks. Pt required min A for functional transfers and max A for pericare. Current plan remains appropriate.    Follow Up Recommendations  SNF     Equipment Recommendations  Other (comment) (to be determined at next venue)    Recommendations for Other Services       Precautions / Restrictions Precautions Precautions: None    Mobility  Bed Mobility               General bed mobility comments: pt OOB in chair upon arrival  Transfers Overall transfer level: Needs assistance Equipment used: Rolling walker (2 wheeled) Transfers: Sit to/from Stand Sit to Stand: Min assist         General transfer comment: assist to power up from recliner and commode with use of grab bar; cues for safe hand placement  Ambulation/Gait Ambulation/Gait assistance: Min guard;Min assist Ambulation Distance (Feet): 200 Feet Assistive device: Rolling walker (2 wheeled) Gait Pattern/deviations: Step-through pattern;Decreased stride length;Decreased dorsiflexion - right;Decreased dorsiflexion - left;Trunk flexed Gait velocity: decreased   General Gait Details: cues for posture and proximity  of RW; min A to steady when turning but min guard overall for safety; 2 brief standing rest breaks due to c/o bilat UE fatigue   Stairs            Wheelchair Mobility    Modified Rankin (Stroke Patients Only)       Balance Overall balance assessment: Needs assistance Sitting-balance support: Feet supported Sitting balance-Leahy Scale: Fair     Standing balance support: Bilateral upper extremity supported Standing balance-Leahy Scale: Poor                              Cognition Arousal/Alertness: Awake/alert Behavior During Therapy: WFL for tasks assessed/performed Overall Cognitive Status: Within Functional Limits for tasks assessed                                        Exercises      General Comments        Pertinent Vitals/Pain Pain Assessment: Faces Faces Pain Scale: Hurts little more Pain Location: abdomen Pain Descriptors / Indicators: Sore;Guarding Pain Intervention(s): Limited activity within patient's tolerance;Monitored during session;Premedicated before session;Repositioned    Home Living                      Prior Function            PT Goals (current goals can now be found in the care plan section) Acute Rehab  PT Goals PT Goal Formulation: With patient Time For Goal Achievement: 02/25/17 Potential to Achieve Goals: Fair Progress towards PT goals: Progressing toward goals    Frequency    Min 3X/week      PT Plan Current plan remains appropriate    Co-evaluation              AM-PAC PT "6 Clicks" Daily Activity  Outcome Measure  Difficulty turning over in bed (including adjusting bedclothes, sheets and blankets)?: Unable Difficulty moving from lying on back to sitting on the side of the bed? : Unable Difficulty sitting down on and standing up from a chair with arms (e.g., wheelchair, bedside commode, etc,.)?: Unable Help needed moving to and from a bed to chair (including a  wheelchair)?: A Little Help needed walking in hospital room?: A Little Help needed climbing 3-5 steps with a railing? : A Lot 6 Click Score: 11    End of Session Equipment Utilized During Treatment: Gait belt Activity Tolerance: Patient tolerated treatment well Patient left: with call bell/phone within reach;in chair Nurse Communication: Mobility status PT Visit Diagnosis: Unsteadiness on feet (R26.81);Other abnormalities of gait and mobility (R26.89);History of falling (Z91.81);Muscle weakness (generalized) (M62.81);Difficulty in walking, not elsewhere classified (R26.2) Pain - Right/Left: Right Pain - part of body: Ankle and joints of foot     Time: 1413-1447 PT Time Calculation (min) (ACUTE ONLY): 34 min  Charges:  $Gait Training: 8-22 mins $Therapeutic Activity: 8-22 mins                    G Codes:       Earney Navy, PTA Pager: [phone]     Darliss Cheney 02/18/2017, 2:59 PM

## 2017-02-19 LAB — CBC WITH DIFFERENTIAL/PLATELET
Basophils Absolute: 0 10*3/uL (ref 0.0–0.1)
Basophils Relative: 0 %
EOS ABS: 0.2 10*3/uL (ref 0.0–0.7)
Eosinophils Relative: 2 %
HCT: 31.2 % — ABNORMAL LOW (ref 36.0–46.0)
HEMOGLOBIN: 10 g/dL — AB (ref 12.0–15.0)
LYMPHS ABS: 1.3 10*3/uL (ref 0.7–4.0)
Lymphocytes Relative: 14 %
MCH: 28.5 pg (ref 26.0–34.0)
MCHC: 32.1 g/dL (ref 30.0–36.0)
MCV: 88.9 fL (ref 78.0–100.0)
Monocytes Absolute: 0.8 10*3/uL (ref 0.1–1.0)
Monocytes Relative: 8 %
Neutro Abs: 7.3 10*3/uL (ref 1.7–7.7)
Neutrophils Relative %: 76 %
Platelets: 249 10*3/uL (ref 150–400)
RBC: 3.51 MIL/uL — AB (ref 3.87–5.11)
RDW: 15.5 % (ref 11.5–15.5)
WBC: 9.7 10*3/uL (ref 4.0–10.5)

## 2017-02-19 LAB — RENAL FUNCTION PANEL
ALBUMIN: 2.2 g/dL — AB (ref 3.5–5.0)
Anion gap: 9 (ref 5–15)
BUN: 55 mg/dL — ABNORMAL HIGH (ref 6–20)
CHLORIDE: 97 mmol/L — AB (ref 101–111)
CO2: 26 mmol/L (ref 22–32)
CREATININE: 1.63 mg/dL — AB (ref 0.44–1.00)
Calcium: 8.6 mg/dL — ABNORMAL LOW (ref 8.9–10.3)
GFR, EST AFRICAN AMERICAN: 34 mL/min — AB (ref >60)
GFR, EST NON AFRICAN AMERICAN: 29 mL/min — AB (ref >60)
Glucose, Bld: 74 mg/dL (ref 65–99)
PHOSPHORUS: 3.6 mg/dL (ref 2.5–4.6)
POTASSIUM: 3.7 mmol/L (ref 3.5–5.1)
Sodium: 132 mmol/L — ABNORMAL LOW (ref 135–145)

## 2017-02-19 LAB — GLUCOSE, CAPILLARY
GLUCOSE-CAPILLARY: 104 mg/dL — AB (ref 65–99)
GLUCOSE-CAPILLARY: 185 mg/dL — AB (ref 65–99)
GLUCOSE-CAPILLARY: 233 mg/dL — AB (ref 65–99)
Glucose-Capillary: 68 mg/dL (ref 65–99)
Glucose-Capillary: 125 mg/dL — ABNORMAL HIGH (ref 65–99)

## 2017-02-19 LAB — MAGNESIUM: MAGNESIUM: 1.9 mg/dL (ref 1.7–2.4)

## 2017-02-19 MED ORDER — HYDROCORTISONE ACETATE 25 MG RE SUPP
25.0000 mg | Freq: Two times a day (BID) | RECTAL | Status: DC | PRN
  Administered 2017-02-19 – 2017-02-20 (×3): 25 mg via RECTAL
  Filled 2017-02-19 (×3): qty 1

## 2017-02-19 MED ORDER — FUROSEMIDE 40 MG PO TABS
40.0000 mg | ORAL_TABLET | Freq: Every day | ORAL | Status: DC
  Administered 2017-02-19 – 2017-02-23 (×4): 40 mg via ORAL
  Filled 2017-02-19 (×4): qty 1

## 2017-02-19 NOTE — Progress Notes (Signed)
[place] VALVE TEAM   Patient Name: Carrie Monroe Date of Encounter: 02/19/2017  Primary Cardiologist: Dr. [facility] Problem List     Active Problems:   S/P TAVR (transcatheter aortic valve replacement)   Acute respiratory failure with hypoxia (HCC)   Hemorrhagic shock (HCC)     Subjective   Feeling a little better. Hoping she gets discharged before her [birth year]. She drained over 200 ccs overnight.   Inpatient Medications    Scheduled Meds: . aspirin EC  81 mg Oral Daily  . clopidogrel  75 mg Oral Q breakfast  . colchicine  0.6 mg Oral BID  . docusate sodium  200 mg Oral BID  . ferrous [id number] C-folic acid  1 capsule Oral TID PC  . furosemide  40 mg Intravenous BID  . Gerhardt's butt cream   Topical BID  . insulin aspart  0-24 Units Subcutaneous TID AC & HS  . insulin detemir  28 Units Subcutaneous BID  . metoprolol tartrate  25 mg Oral BID  . rosuvastatin  5 mg Oral q1800  . sodium chloride flush  3 mL Intravenous Q12H   Continuous Infusions: . sodium chloride    . sodium chloride    . lactated ringers     PRN Meds: docusate sodium, fentaNYL (SUBLIMAZE) injection, Influenza vac split quadrivalent PF, lactated ringers, metoprolol tartrate, ondansetron (ZOFRAN) IV, oxyCODONE-acetaminophen, pneumococcal 23 valent vaccine, sodium chloride flush, sorbitol   Vital Signs    Vitals:   02/18/17 2053 02/19/17 0429 02/19/17 0431 02/19/17 0819  BP: (!) 135/49  (!) 136/49   Pulse: 72 65 69   Resp: (!) 23 19 15    Temp: 98.5 F (36.9 C) 97.7 F (36.5 C)  98.4 F (36.9 C)  TempSrc: Oral Oral  Oral  SpO2: 100% 96% 96%   Weight:      Height:        Intake/Output Summary (Last 24 hours) at 02/19/17 0826 Last data filed at 02/19/17 0819  Gross per 24 hour  Intake             1110 ml  Output              550 ml  Net              560 ml   Filed Weights   02/16/17 0501  02/17/17 0400 02/18/17 0500  Weight: 245 lb 2.4 oz (111.2 kg) 254 lb 1.6 oz (115.3 kg) 249 lb 5 oz (113.1 kg)    Physical Exam   GEN: Well nourished, well developed, in no acute distress. Morbidly obese, sitting up in chair HEENT: Grossly normal.  Neck: Supple, no JVD, carotid bruits, or masses. Cardiac: RRR, 2/6 soft murmur @RUSB , rubs, or gallops. No clubbing, cyanosis, edema.  Radials/DP/PT 2+ and equal bilaterally.  Respiratory:  Respirations regular and unlabored, clear to auscultation bilaterally. GI: Soft, nontender, nondistended, BS + x 4. MS: no deformity or atrophy. Skin: warm and dry, no rash. Improving, tender left groin hematoma with drain and wound vac in place.  Neuro:  Strength and sensation are intact. Psych: AAOx3.  Normal affect.  Labs    CBC  Recent Labs  02/18/17 0210 02/19/17 0334  WBC 10.7* 9.7  NEUTROABS 8.4* 7.3  HGB 9.8* 10.0*  HCT 30.5* 31.2*  MCV 89.2 88.9  PLT 231 109   Basic Metabolic Panel  Recent Labs  02/18/17 0210 02/19/17 0334  NA 132* 132*  K 3.7 3.7  CL 98* 97*  CO2 26 26  GLUCOSE 99 74  BUN 47* 55*  CREATININE 1.50* 1.63*  CALCIUM 8.5* 8.6*  MG 1.8 1.9  PHOS 3.9 3.6   Liver Function Tests  Recent Labs  02/18/17 0210 02/19/17 0334  ALBUMIN 2.1* 2.2*   No results for input(s): LIPASE, AMYLASE in the last 72 hours. Cardiac Enzymes No results for input(s): CKTOTAL, CKMB, CKMBINDEX, TROPONINI in the last 72 hours. BNP Invalid input(s): POCBNP D-Dimer No results for input(s): DDIMER in the last 72 hours. Hemoglobin A1C No results for input(s): HGBA1C in the last 72 hours. Fasting Lipid Panel No results for input(s): CHOL, HDL, LDLCALC, TRIG, CHOLHDL, LDLDIRECT in the last 72 hours. Thyroid Function Tests No results for input(s): TSH, T4TOTAL, T3FREE, THYROIDAB in the last 72 hours.  Invalid input(s): FREET3  Telemetry    NSR, short run of SVT- Personally Reviewed  ECG    NSR, LBBB - Personally  Reviewed  Radiology    No results found.  Cardiac Studies   2D ECHO: 02/06/2017 LV EF: 50% -55% Study Conclusions - Left ventricle: The cavity size was normal. There was mild   concentric hypertrophy. Systolic function was normal. The   estimated ejection fraction was in the range of 50% to 55%. Wall   motion was normal; there were no regional wall motion   abnormalities. Doppler parameters are consistent with abnormal   left ventricular relaxation (grade 1 diastolic dysfunction). - Ventricular septum: Septal motion showed abnormal function and   dyssynergy. - Aortic valve: Medtronic CoreValve Evolut Pro (size 54mm) was   functioning normally. Peak velocity (S): 156 cm/s. Mean gradient   (S): 5 mm Hg. Valve area (Vmax): 1.38 cm^2. - Mitral valve: Moderately calcified annulus. - Pericardium, extracardiac: There was a left pleural effusion.  CT abdomen 02/13/17 IMPRESSION: 1. Large hematoma involving the right lateral abdominal wall measuring approximately 10 x 16 cm. 2. Retroperitoneal hematoma involving the left lower pelvis measuring approximately 9 x 7 x 8 cm. 3. Surgical drain in the left lateral abdominal wall without evidence of hematoma. 4. 4 mm right middle lobe lung nodule as noted previously. Please see prior recommendations for follow-up. 5. Nonobstructing bilateral renal calculi. 6. Large colonic stool burden. Scattered colonic diverticula without evidence of acute diverticulitis. 7. Likely post radiation skin thickening and trabecular thickening involving the left breast  Patient Profile     81 y.o. female with history of chronic diastolic CHF, HTN, HLD, DM, gout, breast cancer, CAD, severe aortic stenosis and severe carotid artery disease who was admitted following TAVR on 02/05/17. Following the case she had an active bleed from her left groin that we were unable to control with manual hemostasis. She had a significant blood loss with shock and was taken to the  OR on 9/11 for femoral artery repair. Her Hg dropped again and she then went back to the OR on 9/14 for evacuation of hematoma and Blake drain placement with primary closure.   Assessment & Plan    Severe AS: s/p successful TAVR with a 26 mm Medtronic CoreValve Evolut Pro via L TF approach on 02/05/17. Post operative echo 02/06/17 showed good valve placement with no PVL. ECG with new LBBB but no bradyarrhythmia. She has been resumed on her ASA/Plavix given DES x2 to her LAD and LCx 8/3 and TAVR  Acute on chronic diastolic CHF: she was up ~16 lbs from pre op. She was diuresed with IV  lasix and weight came down nicely. She is close to her baseline weight ~246 lbs and creat starting to creep up above baseline. Will stop IV lasix and resume home PO lasix 40mg  daily.   Acute blood loss anemia: Her Hg had stabilized after ~18 PRBCs transfusions and two trips to the OR. Hg dropped to 6.6 on 9/19 and she was transfused 2U PRBCs. Hg has remained stable since that time. Continue to monitor.   Left groin hematoma: Post op she had a large hematoma with active bleeding resulting in hypovolemic shock requiring pressors and resuscitation with 18 units of pRBCs, platelets, cryo, FFP and closure of the artery in the OR on 9/11 and hematoma evacuation on 9/14. CT abdomen 9/19 showed a large abdominal hematoma and retroperitoneal hematoma. Her BP has remained stable. She was transfused and Dr. Trula Slade recommends continued monitoring for now. She drained over 200 cc's last night. White count has now normalized and she is afebrile.   Thrombocytopenia: resolved.   Acute respiratory failure/shock: now extubated and off all pressors   AKI: creatinine 1.6 today, which is a little above his baseline of 1.4-1.5   CAD: she underwent DES to mLAD, PCTA to oDiag, and DES to LCx on 12/28/16. Continue DAPT with ASA/plavix   Carotid artery disease: she will require R CEA after she recovers from TAVR  Severe physical debility w/  limited mobility: PT recommending SNF at discharge  L ankle Gout: uric acid level elevated.  Treated with colchicine    Signed, Angelena Form, PA-C  02/19/2017, 8:26 AM   I have personally seen and examined this patient with Angelena Form, PA-C. I agree with the assessment and plan as outlined above. She is doing well today. H/H stable but 200 cc output from drain. Vascular surgery following wound. She is having no dyspnea or CP. Weight back to baseline. Change to po Lasix. Continue ASA/Plavix given recent coronary stent and TAVR. Will need SNF at discharge.   Lauree Chandler 02/19/2017 10:26 AM

## 2017-02-19 NOTE — Progress Notes (Signed)
CBG 68 Gave patient O.J. And cup of fruit patient had no complaints. Did not want her H.S. Snack last night

## 2017-02-19 NOTE — Progress Notes (Signed)
Pt in the bathroom.  Per the RN, the JP put out ~200cc/24hr, however, they felt it didn't have good suction and readjusted it and it has put out 100cc this morning.  Per RN, wound vac has good seal.     Leave JP drain in and continue wound vac. Will check on pt tomorrow.   Leontine Locket, [facility] 02/19/2017 10:51 AM   Brownish fluid in JP Will need to be present for [facility] change today Considering exploration of wound on Friday   [facility]

## 2017-02-19 NOTE — Progress Notes (Signed)
Per report from day shift R.N. No output via J.P. Drain. When Day shift R.N. And I got her up  to go to bathroom J.P. Became full .

## 2017-02-19 NOTE — Progress Notes (Signed)
CARDIAC REHAB PHASE I   PRE:  Rate/Rhythm:66 SR  BP:  Supine:   Sitting: 152/62 Standing:    SaO2: 93%RA  MODE:  Ambulation: 152 ft   POST:  Rate/Rhythm: 107 ST  BP:  Supine:   Sitting: 153/58 Standing:    SaO2: 94%RA 1121-1158 Pt walked 152 ft on RA with gait belt use, rolling walker and asst x 1. One asst to follow with rollator and to manage wound vac. Pt did not need to sit. Tolerated well. To bathroom after walk and then to recliner. Call light in reach. JP to suction and wound vac intact. Does tire easily.   Graylon Good, RN BSN  02/19/2017 11:52 AM

## 2017-02-19 NOTE — Progress Notes (Signed)
Patient drain a total of 200 ml via J.P. Drain tonight. And there was no marks or recordings in chart from vac dsg approx.  150 ml in canister. Marland Kitchen

## 2017-02-20 LAB — CBC WITH DIFFERENTIAL/PLATELET
BASOS PCT: 0 %
Basophils Absolute: 0 10*3/uL (ref 0.0–0.1)
EOS ABS: 0.2 10*3/uL (ref 0.0–0.7)
Eosinophils Relative: 2 %
HEMATOCRIT: 28.7 % — AB (ref 36.0–46.0)
Hemoglobin: 9.4 g/dL — ABNORMAL LOW (ref 12.0–15.0)
LYMPHS ABS: 0.9 10*3/uL (ref 0.7–4.0)
Lymphocytes Relative: 12 %
MCH: 29 pg (ref 26.0–34.0)
MCHC: 32.8 g/dL (ref 30.0–36.0)
MCV: 88.6 fL (ref 78.0–100.0)
MONOS PCT: 12 %
Monocytes Absolute: 0.9 10*3/uL (ref 0.1–1.0)
NEUTROS ABS: 5.3 10*3/uL (ref 1.7–7.7)
Neutrophils Relative %: 74 %
PLATELETS: 229 10*3/uL (ref 150–400)
RBC: 3.24 MIL/uL — AB (ref 3.87–5.11)
RDW: 15.3 % (ref 11.5–15.5)
WBC: 7.1 10*3/uL (ref 4.0–10.5)

## 2017-02-20 LAB — RENAL FUNCTION PANEL
Albumin: 2 g/dL — ABNORMAL LOW (ref 3.5–5.0)
Anion gap: 8 (ref 5–15)
BUN: 52 mg/dL — ABNORMAL HIGH (ref 6–20)
CO2: 27 mmol/L (ref 22–32)
Calcium: 8.5 mg/dL — ABNORMAL LOW (ref 8.9–10.3)
Chloride: 98 mmol/L — ABNORMAL LOW (ref 101–111)
Creatinine, Ser: 1.39 mg/dL — ABNORMAL HIGH (ref 0.44–1.00)
GFR calc Af Amer: 41 mL/min — ABNORMAL LOW
GFR calc non Af Amer: 35 mL/min — ABNORMAL LOW
Glucose, Bld: 74 mg/dL (ref 65–99)
Phosphorus: 3.2 mg/dL (ref 2.5–4.6)
Potassium: 3.4 mmol/L — ABNORMAL LOW (ref 3.5–5.1)
Sodium: 133 mmol/L — ABNORMAL LOW (ref 135–145)

## 2017-02-20 LAB — GLUCOSE, CAPILLARY: Glucose-Capillary: 80 mg/dL (ref 65–99)

## 2017-02-20 LAB — MAGNESIUM: Magnesium: 1.9 mg/dL (ref 1.7–2.4)

## 2017-02-20 NOTE — Progress Notes (Signed)
This am CBG was 68. 120 ml of orange juice given. CBG rechecked; it was 80.

## 2017-02-20 NOTE — Progress Notes (Signed)
Spoke with RN who suggested to hold on ambulation at this time due to [facility] being disconnected and needing debridement. Will f/u tomorrow. Yves Dill CES, ACSM 3:25 PM 02/20/2017

## 2017-02-20 NOTE — Progress Notes (Signed)
Physical Therapy Treatment Patient Details Name: Carrie Monroe MRN: [id number] DOB: 02-Dec-1935 Today's Date: 02/20/2017    History of Present Illness 81 y.o. female with history of chronic diastolic CHF, HTN, HLD, DM, gout, breast cancer, CAD, severe aortic stenosis and severe carotid artery disease who was admitted following TAVR on 02/05/17. Following the case she had an active bleed from her left groin that we were unable to control with manual hemostasis. She had a significant blood loss with shock and was taken to the OR on 9/11 for femoral artery repair. Her Hg dropped again and she then went back to the OR on 9/14 for evacuation of hematoma and Blake drain placement with primary closure.     PT Comments    Patient is making gradual progress toward mobility goals. Pt required less assist for sit to stand transfers and gait this session and mod A for bed mobility. Pt required max A for pericare. Current plan remains appropriate.    Follow Up Recommendations  SNF     Equipment Recommendations  Other (comment) (to be determined at next venue)    Recommendations for Other Services       Precautions / Restrictions Precautions Precautions: None Restrictions Weight Bearing Restrictions: No    Mobility  Bed Mobility Overal bed mobility: Needs Assistance Bed Mobility: Sit to Supine       Sit to supine: Mod assist   General bed mobility comments: cues for sequencing; assist to bring bilat LE into bed  Transfers Overall transfer level: Needs assistance Equipment used: Rolling walker (2 wheeled) Transfers: Sit to/from Stand Sit to Stand: Min guard         General transfer comment: min guard for safety; cues for safe hand placement  Ambulation/Gait Ambulation/Gait assistance: Supervision Ambulation Distance (Feet): 230 Feet Assistive device: Rolling walker (2 wheeled) Gait Pattern/deviations: Step-through pattern;Decreased stride length;Decreased dorsiflexion -  right;Decreased dorsiflexion - left;Trunk flexed Gait velocity: decreased   General Gait Details: cues for posture and proximity of RW; relies heavily on RW for support; 1 standing rest break due to c/o bilat UE fatigue   Stairs            Wheelchair Mobility    Modified Rankin (Stroke Patients Only)       Balance Overall balance assessment: Needs assistance Sitting-balance support: Feet supported;Bilateral upper extremity supported Sitting balance-Leahy Scale: Fair     Standing balance support: Bilateral upper extremity supported Standing balance-Leahy Scale: Poor Standing balance comment: requires RW assist                            Cognition Arousal/Alertness: Awake/alert Behavior During Therapy: WFL for tasks assessed/performed Overall Cognitive Status: Within Functional Limits for tasks assessed                                        Exercises      General Comments        Pertinent Vitals/Pain Pain Assessment: Faces Faces Pain Scale: Hurts little more Pain Location: location of wounds with bed mobility Pain Descriptors / Indicators: Sore;Guarding;Grimacing Pain Intervention(s): Limited activity within patient's tolerance;Monitored during session;Repositioned    Home Living                      Prior Function  PT Goals (current goals can now be found in the care plan section) Acute Rehab PT Goals PT Goal Formulation: With patient Time For Goal Achievement: 02/25/17 Potential to Achieve Goals: Fair Progress towards PT goals: Progressing toward goals    Frequency    Min 3X/week      PT Plan Current plan remains appropriate    Co-evaluation              AM-PAC PT "6 Clicks" Daily Activity  Outcome Measure  Difficulty turning over in bed (including adjusting bedclothes, sheets and blankets)?: Unable Difficulty moving from lying on back to sitting on the side of the bed? :  Unable Difficulty sitting down on and standing up from a chair with arms (e.g., wheelchair, bedside commode, etc,.)?: A Lot Help needed moving to and from a bed to chair (including a wheelchair)?: A Little Help needed walking in hospital room?: A Little Help needed climbing 3-5 steps with a railing? : A Lot 6 Click Score: 12    End of Session Equipment Utilized During Treatment: Gait belt Activity Tolerance: Patient tolerated treatment well Patient left: with call bell/phone within reach;in bed Nurse Communication: Mobility status PT Visit Diagnosis: Unsteadiness on feet (R26.81);Other abnormalities of gait and mobility (R26.89);History of falling (Z91.81);Muscle weakness (generalized) (M62.81);Difficulty in walking, not elsewhere classified (R26.2) Pain - Right/Left: Right Pain - part of body: Ankle and joints of foot     Time: 1030-1100 PT Time Calculation (min) (ACUTE ONLY): 30 min  Charges:  $Gait Training: 8-22 mins $Therapeutic Activity: 8-22 mins                    G Codes:       Earney Navy, PTA Pager: [phone]     Darliss Cheney 02/20/2017, 11:40 AM

## 2017-02-20 NOTE — Progress Notes (Signed)
[facility]  Subjective  - No new complaints.   Objective (!) 101/37 73 98.6 F (37 C) (Oral) 17 95%  Intake/Output Summary (Last 24 hours) at 02/20/17 1130 Last data filed at 02/20/17 0900  Gross per 24 hour  Intake             1020 ml  Output             1350 ml  Net             -330 ml    Left groin malodorous, green drainage Drain out put total 350 last 24 hours Dry ABD placed over wound incision    Assessment/Planning: Left groin hematoma status post repair of left common femoral artery afterTAVRprocedure on 02/05/2017  Afebrile, WBC 7.1 She may need to return to the OR for I & D of left groin.  I will wait until Dr. Trula Slade has inspected the left groin wound.   Laurence Slate [facility] 02/20/2017 11:30 AM --  Laboratory Lab Results:  Recent Labs  02/19/17 0334 02/20/17 0352  WBC 9.7 7.1  HGB 10.0* 9.4*  HCT 31.2* 28.7*  PLT 249 229   BMET  Recent Labs  02/19/17 0334 02/20/17 0352  NA 132* 133*  K 3.7 3.4*  CL 97* 98*  CO2 26 27  GLUCOSE 74 74  BUN 55* 52*  CREATININE 1.63* 1.39*  CALCIUM 8.6* 8.5*    COAG Lab Results  Component Value Date   INR 1.12 02/08/2017   INR 1.38 02/05/2017   INR 1.60 02/05/2017   No results found for: PTT

## 2017-02-20 NOTE — Progress Notes (Signed)
[facility] VALVE TEAM   Patient Name: Carrie Monroe Date of Encounter: 02/20/2017  Primary Cardiologist: Dr. [facility] Problem List     Active Problems:   S/P TAVR (transcatheter aortic valve replacement)   Acute respiratory failure with hypoxia (HCC)   Hemorrhagic shock (HCC)     Subjective   No complaints.   Inpatient Medications    Scheduled Meds: . aspirin EC  81 mg Oral Daily  . clopidogrel  75 mg Oral Q breakfast  . colchicine  0.6 mg Oral BID  . docusate sodium  200 mg Oral BID  . ferrous [id number] C-folic acid  1 capsule Oral TID PC  . furosemide  40 mg Oral Daily  . Gerhardt's butt cream   Topical BID  . insulin aspart  0-24 Units Subcutaneous TID AC & HS  . insulin detemir  28 Units Subcutaneous BID  . metoprolol tartrate  25 mg Oral BID  . rosuvastatin  5 mg Oral q1800  . sodium chloride flush  3 mL Intravenous Q12H   Continuous Infusions: . sodium chloride    . sodium chloride    . lactated ringers     PRN Meds: docusate sodium, fentaNYL (SUBLIMAZE) injection, hydrocortisone, Influenza vac split quadrivalent PF, lactated ringers, metoprolol tartrate, ondansetron (ZOFRAN) IV, oxyCODONE-acetaminophen, pneumococcal 23 valent vaccine, sodium chloride flush, sorbitol   Vital Signs    Vitals:   02/19/17 1220 02/19/17 1952 02/20/17 0420 02/20/17 0820  BP:  (!) 157/72 (!) 101/37   Pulse: 72 81 73   Resp: 15 (!) 26 17   Temp:  98.5 F (36.9 C) 98.3 F (36.8 C) 98.6 F (37 C)  TempSrc:  Oral Oral Oral  SpO2: 100% 99% 90% 95%  Weight:   244 lb 12.8 oz (111 kg)   Height:        Intake/Output Summary (Last 24 hours) at 02/20/17 0924 Last data filed at 02/20/17 0644  Gross per 24 hour  Intake              780 ml  Output             1050 ml  Net             -270 ml   Filed Weights   02/17/17 0400 02/18/17 0500 02/20/17 0420  Weight: 254 lb 1.6 oz (115.3 kg) 249 lb 5 oz  (113.1 kg) 244 lb 12.8 oz (111 kg)    Physical Exam   GEN: Well nourished, well developed, in no acute distress. Morbidly obese, sitting up in chair HEENT: Grossly normal.  Neck: Supple, no JVD, carotid bruits, or masses. Cardiac: RRR, 2/6 soft murmur @RUSB , rubs, or gallops. No clubbing, cyanosis, edema.  Radials/DP/PT 2+ and equal bilaterally.  Respiratory:  Respirations regular and unlabored, clear to auscultation bilaterally. GI: Soft, nontender, nondistended, BS + x 4. MS: no deformity or atrophy. Skin: warm and dry, no rash. Improving, tender left groin hematoma with drain and wound vac in place.  Neuro:  Strength and sensation are intact. Psych: AAOx3.  Normal affect.  Labs    CBC  Recent Labs  02/19/17 0334 02/20/17 0352  WBC 9.7 7.1  NEUTROABS 7.3 5.3  HGB 10.0* 9.4*  HCT 31.2* 28.7*  MCV 88.9 88.6  PLT 249 027   Basic Metabolic Panel  Recent Labs  02/19/17 0334 02/20/17 0352  NA 132* 133*  K 3.7 3.4*  CL 97* 98*  CO2  26 27  GLUCOSE 74 74  BUN 55* 52*  CREATININE 1.63* 1.39*  CALCIUM 8.6* 8.5*  MG 1.9 1.9  PHOS 3.6 3.2   Liver Function Tests  Recent Labs  02/19/17 0334 02/20/17 0352  ALBUMIN 2.2* 2.0*   No results for input(s): LIPASE, AMYLASE in the last 72 hours. Cardiac Enzymes No results for input(s): CKTOTAL, CKMB, CKMBINDEX, TROPONINI in the last 72 hours. BNP Invalid input(s): POCBNP D-Dimer No results for input(s): DDIMER in the last 72 hours. Hemoglobin A1C No results for input(s): HGBA1C in the last 72 hours. Fasting Lipid Panel No results for input(s): CHOL, HDL, LDLCALC, TRIG, CHOLHDL, LDLDIRECT in the last 72 hours. Thyroid Function Tests No results for input(s): TSH, T4TOTAL, T3FREE, THYROIDAB in the last 72 hours.  Invalid input(s): FREET3  Telemetry    NSR, short run of SVT- Personally Reviewed  ECG    NSR, LBBB - Personally Reviewed  Radiology    No results found.  Cardiac Studies   2D ECHO: 02/06/2017 LV  EF: 50% -55% Study Conclusions - Left ventricle: The cavity size was normal. There was mild   concentric hypertrophy. Systolic function was normal. The   estimated ejection fraction was in the range of 50% to 55%. Wall   motion was normal; there were no regional wall motion   abnormalities. Doppler parameters are consistent with abnormal   left ventricular relaxation (grade 1 diastolic dysfunction). - Ventricular septum: Septal motion showed abnormal function and   dyssynergy. - Aortic valve: Medtronic CoreValve Evolut Pro (size 65mm) was   functioning normally. Peak velocity (S): 156 cm/s. Mean gradient   (S): 5 mm Hg. Valve area (Vmax): 1.38 cm^2. - Mitral valve: Moderately calcified annulus. - Pericardium, extracardiac: There was a left pleural effusion.  CT abdomen 02/13/17 IMPRESSION: 1. Large hematoma involving the right lateral abdominal wall measuring approximately 10 x 16 cm. 2. Retroperitoneal hematoma involving the left lower pelvis measuring approximately 9 x 7 x 8 cm. 3. Surgical drain in the left lateral abdominal wall without evidence of hematoma. 4. 4 mm right middle lobe lung nodule as noted previously. Please see prior recommendations for follow-up. 5. Nonobstructing bilateral renal calculi. 6. Large colonic stool burden. Scattered colonic diverticula without evidence of acute diverticulitis. 7. Likely post radiation skin thickening and trabecular thickening involving the left breast  Patient Profile     81 y.o. female with history of chronic diastolic CHF, HTN, HLD, DM, gout, breast cancer, CAD, severe aortic stenosis and severe carotid artery disease who was admitted following TAVR on 02/05/17. Following the case she had an active bleed from her left groin that we were unable to control with manual hemostasis. She had a significant blood loss with shock and was taken to the OR on 9/11 for femoral artery repair. Her Hg dropped again and she then went back to the OR  on 9/14 for evacuation of hematoma and Blake drain placement with primary closure.   Assessment & Plan    Severe AS: s/p successful TAVR with a 26 mm Medtronic CoreValve Evolut Pro via L TF approach on 02/05/17. Post operative echo 02/06/17 showed good valve placement with no PVL. ECG with new LBBB but no bradyarrhythmia. She has been resumed on her ASA/Plavix given DES x2 to her LAD and LCx 8/3 and TAVR  Acute on chronic diastolic CHF: she was up ~13 lbs from pre op. She was diuresed with IV lasix and weight came down nicely. She is close to her baseline weight  and has been transitioned to home PO lasix.   Acute blood loss anemia: Her Hg had stabilized after ~18 PRBCs transfusions and two trips to the OR. Hg dropped to 6.6 on 9/19 and she was transfused 2U PRBCs. Hg has remained stable since that time.   Left groin hematoma: Post op she had a large hematoma with active bleeding resulting in hypovolemic shock requiring pressors and resuscitation with 18 units of pRBCs, platelets, cryo, FFP and closure of the artery in the OR on 9/11 and hematoma evacuation on 9/14. CT abdomen 9/19 showed a large abdominal hematoma and retroperitoneal hematoma. Her BP has remained stable. Wound vac and drain in place. Per Dr. Trula Slade, consider exploration of wound on Friday.   Thrombocytopenia: resolved.   Acute respiratory failure/shock: now extubated and off all pressors   AKI: creatinine back to baseline of 1.4-1.5   CAD: she underwent DES to mLAD, PCTA to oDiag, and DES to LCx on 12/28/16. Continue DAPT with ASA/plavix   Carotid artery disease: she will require R CEA after she recovers from TAVR  Severe physical debility w/ limited mobility: PT recommending SNF at discharge   Signed, Angelena Form, PA-C  02/20/2017, 9:24 AM   I have personally seen and examined this patient with Angelena Form, PA-C I agree with the assessment and plan as outlined above. She is stable today with stable BP and HR.  Creatinine improved. Volume status ok. H/H stable. Wound continues to be her big issue. The wound vac was removed this am. Wound is now malodorous. VVS to see today. She may need wound exploration. Her WBC is normal and she is afebrile.   Lauree Chandler 02/20/2017 1:24 PM

## 2017-02-21 LAB — CBC WITH DIFFERENTIAL/PLATELET
BASOS ABS: 0 10*3/uL (ref 0.0–0.1)
BASOS PCT: 0 %
EOS ABS: 0.2 10*3/uL (ref 0.0–0.7)
EOS PCT: 2 %
HEMATOCRIT: 28.7 % — AB (ref 36.0–46.0)
Hemoglobin: 9.2 g/dL — ABNORMAL LOW (ref 12.0–15.0)
LYMPHS PCT: 13 %
Lymphs Abs: 0.9 10*3/uL (ref 0.7–4.0)
MCH: 28.5 pg (ref 26.0–34.0)
MCHC: 32.1 g/dL (ref 30.0–36.0)
MCV: 88.9 fL (ref 78.0–100.0)
MONO ABS: 0.6 10*3/uL (ref 0.1–1.0)
Monocytes Relative: 9 %
Neutro Abs: 4.8 10*3/uL (ref 1.7–7.7)
Neutrophils Relative %: 76 %
Platelets: 219 10*3/uL (ref 150–400)
RBC: 3.23 MIL/uL — AB (ref 3.87–5.11)
RDW: 15.2 % (ref 11.5–15.5)
WBC: 6.4 10*3/uL (ref 4.0–10.5)

## 2017-02-21 LAB — RENAL FUNCTION PANEL
ALBUMIN: 1.9 g/dL — AB (ref 3.5–5.0)
Anion gap: 7 (ref 5–15)
BUN: 45 mg/dL — AB (ref 6–20)
CALCIUM: 8.6 mg/dL — AB (ref 8.9–10.3)
CO2: 27 mmol/L (ref 22–32)
Chloride: 100 mmol/L — ABNORMAL LOW (ref 101–111)
Creatinine, Ser: 1.27 mg/dL — ABNORMAL HIGH (ref 0.44–1.00)
GFR calc Af Amer: 46 mL/min — ABNORMAL LOW (ref >60)
GFR, EST NON AFRICAN AMERICAN: 39 mL/min — AB (ref >60)
GLUCOSE: 78 mg/dL (ref 65–99)
PHOSPHORUS: 2.8 mg/dL (ref 2.5–4.6)
POTASSIUM: 3.6 mmol/L (ref 3.5–5.1)
SODIUM: 134 mmol/L — AB (ref 135–145)

## 2017-02-21 LAB — GLUCOSE, CAPILLARY
GLUCOSE-CAPILLARY: 100 mg/dL — AB (ref 65–99)
Glucose-Capillary: 106 mg/dL — ABNORMAL HIGH (ref 65–99)
Glucose-Capillary: 149 mg/dL — ABNORMAL HIGH (ref 65–99)

## 2017-02-21 LAB — MAGNESIUM: Magnesium: 1.9 mg/dL (ref 1.7–2.4)

## 2017-02-21 MED ORDER — ADULT MULTIVITAMIN W/MINERALS CH
1.0000 | ORAL_TABLET | Freq: Every day | ORAL | Status: DC
  Administered 2017-02-21 – 2017-02-27 (×7): 1 via ORAL
  Filled 2017-02-21 (×8): qty 1

## 2017-02-21 MED ORDER — PRO-STAT SUGAR FREE PO LIQD
30.0000 mL | Freq: Two times a day (BID) | ORAL | Status: DC
  Administered 2017-02-21 – 2017-02-27 (×5): 30 mL via ORAL
  Filled 2017-02-21 (×8): qty 30

## 2017-02-21 MED ORDER — CEFAZOLIN SODIUM-DEXTROSE 2-4 GM/100ML-% IV SOLN
2.0000 g | Freq: Once | INTRAVENOUS | Status: AC
Start: 2017-02-22 — End: 2017-02-22
  Administered 2017-02-22: 2 g via INTRAVENOUS
  Filled 2017-02-21: qty 100

## 2017-02-21 MED ORDER — CEFAZOLIN SODIUM-DEXTROSE 1-4 GM/50ML-% IV SOLN: 1.0000 g | INTRAVENOUS | Status: DC

## 2017-02-21 NOTE — Assessment & Plan Note (Deleted)
Stage IA breast cancer diagnosed in 2009 ER/PR positive HER-2 negative currently on antiestrogen therapy with tamoxifen tolerating it very well. She is being on antiestrogen therapy since 02/26/2008. Patient wishes to continue this therapy for 10 years  Left Lumpectomy August 2016: Benign breast calcifications  Tamoxifen toxicities: Mild depression   Breast Cancer Surveillance: 1. Breast exam 02/22/2017: Normal 2. Mammogram July 2018 benign, breast density category B  Left breast calcifications: Status post lumpectomy benign August 2016  Return to clinic in 1 year for continued surveillance and follow-up

## 2017-02-21 NOTE — Progress Notes (Signed)
[facility] VALVE TEAM   Patient Name: Carrie Monroe Date of Encounter: 02/21/2017  Primary Cardiologist: Dr. [facility] Problem List     Active Problems:   S/P TAVR (transcatheter aortic valve replacement)   Acute respiratory failure with hypoxia (HCC)   Hemorrhagic shock (HCC)     Subjective   Trying to keep spirits up. Plan to go back to OR for wound exploration tomorrow.   Inpatient Medications    Scheduled Meds: . aspirin EC  81 mg Oral Daily  . clopidogrel  75 mg Oral Q breakfast  . colchicine  0.6 mg Oral BID  . docusate sodium  200 mg Oral BID  . ferrous [id number] C-folic acid  1 capsule Oral TID PC  . furosemide  40 mg Oral Daily  . Gerhardt's butt cream   Topical BID  . insulin aspart  0-24 Units Subcutaneous TID AC & HS  . insulin detemir  28 Units Subcutaneous BID  . metoprolol tartrate  25 mg Oral BID  . rosuvastatin  5 mg Oral q1800  . sodium chloride flush  3 mL Intravenous Q12H   Continuous Infusions: . sodium chloride    . lactated ringers     PRN Meds: docusate sodium, fentaNYL (SUBLIMAZE) injection, hydrocortisone, Influenza vac split quadrivalent PF, lactated ringers, metoprolol tartrate, ondansetron (ZOFRAN) IV, oxyCODONE-acetaminophen, pneumococcal 23 valent vaccine, sodium chloride flush, sorbitol   Vital Signs    Vitals:   02/20/17 2000 02/20/17 2200 02/21/17 0606 02/21/17 0616  BP: (!) 152/55   (!) 135/50  Pulse: 78 76    Resp:   (!) 21   Temp: 99.3 F (37.4 C)   98 F (36.7 C)  TempSrc: Oral   Oral  SpO2: 100%   95%  Weight:   232 lb 9.6 oz (105.5 kg)   Height:        Intake/Output Summary (Last 24 hours) at 02/21/17 0944 Last data filed at 02/21/17 0600  Gross per 24 hour  Intake              210 ml  Output              900 ml  Net             -690 ml   Filed Weights   02/18/17 0500 02/20/17 0420 02/21/17 0606  Weight: 249 lb 5 oz (113.1 kg)  244 lb 12.8 oz (111 kg) 232 lb 9.6 oz (105.5 kg)    Physical Exam   GEN: Well nourished, well developed, in no acute distress. Morbidly obese, sitting up in chair HEENT: Grossly normal.  Neck: Supple, no JVD, carotid bruits, or masses. Cardiac: RRR, 2/6 soft murmur @RUSB , rubs, or gallops. No clubbing, cyanosis, edema.  Radials/DP/PT 2+ and equal bilaterally.  Respiratory:  Respirations regular and unlabored, clear to auscultation bilaterally. GI: Soft, nontender, nondistended, BS + x 4. MS: no deformity or atrophy. Skin: warm and dry, no rash. malodorous left groin hematoma with drain and wound vac in place.  Neuro:  Strength and sensation are intact. Psych: AAOx3.  Normal affect.  Labs    CBC  Recent Labs  02/20/17 0352 02/21/17 0427  WBC 7.1 6.4  NEUTROABS 5.3 4.8  HGB 9.4* 9.2*  HCT 28.7* 28.7*  MCV 88.6 88.9  PLT 229 326   Basic Metabolic Panel  Recent Labs  02/20/17 0352 02/21/17 0427  NA 133* 134*  K 3.4* 3.6  CL  98* 100*  CO2 27 27  GLUCOSE 74 78  BUN 52* 45*  CREATININE 1.39* 1.27*  CALCIUM 8.5* 8.6*  MG 1.9 1.9  PHOS 3.2 2.8   Liver Function Tests  Recent Labs  02/20/17 0352 02/21/17 0427  ALBUMIN 2.0* 1.9*   No results for input(s): LIPASE, AMYLASE in the last 72 hours. Cardiac Enzymes No results for input(s): CKTOTAL, CKMB, CKMBINDEX, TROPONINI in the last 72 hours. BNP Invalid input(s): POCBNP D-Dimer No results for input(s): DDIMER in the last 72 hours. Hemoglobin A1C No results for input(s): HGBA1C in the last 72 hours. Fasting Lipid Panel No results for input(s): CHOL, HDL, LDLCALC, TRIG, CHOLHDL, LDLDIRECT in the last 72 hours. Thyroid Function Tests No results for input(s): TSH, T4TOTAL, T3FREE, THYROIDAB in the last 72 hours.  Invalid input(s): FREET3  Telemetry    NSR, short run of SVT- Personally Reviewed  ECG    NSR, LBBB - Personally Reviewed  Radiology    No results found.  Cardiac Studies   Postoperative  echo: 02/06/2017 LV EF: 50% -55% Study Conclusions - Left ventricle: The cavity size was normal. There was mild   concentric hypertrophy. Systolic function was normal. The   estimated ejection fraction was in the range of 50% to 55%. Wall   motion was normal; there were no regional wall motion   abnormalities. Doppler parameters are consistent with abnormal   left ventricular relaxation (grade 1 diastolic dysfunction). - Ventricular septum: Septal motion showed abnormal function and   dyssynergy. - Aortic valve: Medtronic CoreValve Evolut Pro (size 2mm) was   functioning normally. Peak velocity (S): 156 cm/s. Mean gradient   (S): 5 mm Hg. Valve area (Vmax): 1.38 cm^2. - Mitral valve: Moderately calcified annulus. - Pericardium, extracardiac: There was a left pleural effusion.  CT abdomen 02/13/17 IMPRESSION: 1. Large hematoma involving the right lateral abdominal wall measuring approximately 10 x 16 cm. 2. Retroperitoneal hematoma involving the left lower pelvis measuring approximately 9 x 7 x 8 cm. 3. Surgical drain in the left lateral abdominal wall without evidence of hematoma. 4. 4 mm right middle lobe lung nodule as noted previously. Please see prior recommendations for follow-up. 5. Nonobstructing bilateral renal calculi. 6. Large colonic stool burden. Scattered colonic diverticula without evidence of acute diverticulitis. 7. Likely post radiation skin thickening and trabecular thickening involving the left breast  Patient Profile     81 y.o. female with history of chronic diastolic CHF, HTN, HLD, DM, gout, breast cancer, CAD, severe aortic stenosis and severe carotid artery disease who was admitted following TAVR on 02/05/17. Following the case she had an active bleed from her left groin that we were unable to control with manual hemostasis. She had a significant blood loss with shock and was taken to the OR on 9/11 for femoral artery repair. Her Hg dropped again and she then  went back to the OR on 9/14 for evacuation of hematoma and Blake drain placement with primary closure. She has had a prolonged hospital course due to groin wound.   Assessment & Plan    Severe AS: s/p successful TAVR with a 26 mm Medtronic CoreValve Evolut Pro via L TF approach on 02/05/17. Post operative echo 02/06/17 showed good valve placement with no PVL. ECG with new LBBB but no bradyarrhythmia. She has been resumed on her ASA/Plavix given DES x2 to her LAD and LCx 8/3 and TAVR  Acute on chronic diastolic CHF: she was up ~53 lbs from pre op. She was diuresed  with IV lasix and weight came down nicely. She is now below her baseline weight and has been transitioned to home PO lasix.   Acute blood loss anemia: Her Hg had stabilized after ~18 PRBCs transfusions and two trips to the OR. Hg dropped to 6.6 on 9/19 and she was transfused 2U PRBCs. Hg has remained stable since that time.   Left groin hematoma: Post op she had a large hematoma with active bleeding resulting in hypovolemic shock requiring pressors and resuscitation with 18 units of pRBCs, platelets, cryo, FFP and closure of the artery in the OR on 9/11 and hematoma evacuation on 9/14. CT abdomen 9/19 showed a large abdominal hematoma and retroperitoneal hematoma. Her BP has remained stable. Wound vac and drain in place. No white count and afebrile but wound malodorous. Plan is to bring her back to OR tomorrow for wound exploration.  Thrombocytopenia: resolved.   Acute respiratory failure/shock: now extubated and off all pressors   AKI: creatinine back to baseline of 1.2-1.5   CAD: she underwent DES to mLAD, PCTA to oDiag, and DES to LCx on 12/28/16. Continue DAPT with ASA/plavix   Carotid artery disease: she will require R CEA after she recovers from TAVR  Severe physical debility w/ limited mobility: PT recommending SNF at discharge   Signed, Angelena Form, PA-C  02/21/2017, 9:44 AM   I have personally seen and examined this  patient with Angelena Form, PA-C. I agree with the assessment and plan as outlined above. She is stable this am. BP stable. HR stable. Sinus rhythm. No complaints. Wound is malodorous. Plans for wound exploration tomorrow per VVS. No changes today. WBC is normal. Afebrile. H/H stable.   Lauree Chandler 02/21/2017 11:07 AM

## 2017-02-21 NOTE — Progress Notes (Signed)
Initial Nutrition Assessment  DOCUMENTATION CODES:   Morbid obesity  INTERVENTION:    Pro-stat 30 ml PO BID with meals, each supplement provides 100 kcal and 15 gm protein  MVI daily to promote wound healing  NUTRITION DIAGNOSIS:   Increased nutrient needs related to wound healing as evidenced by estimated needs.  GOAL:   Patient will meet greater than or equal to 90% of their needs  MONITOR:   PO intake, Supplement acceptance, Skin, Labs  REASON FOR ASSESSMENT:   Consult Wound healing  ASSESSMENT:   81 yo female with PMH of HTN, HLD, obesity, DM, CAD, breast cancer, CHF, CKD who was admitted on 9/11 with severe aortic stenosis. S/P transcatheter aortic valve replacement on 9/11. Developed L groin hematoma after procedure, now a non-healing wound.  Spoke with patient who reports she has been eating variably since admission. For the past 2 days she has been consuming 75-100% of meals. Prior to that her intake was poorer. She has had some trouble chewing (especially meats) due to extraction of multiple teeth prior to surgery. She doesn't eat many meats. Has been ordering chicken and tuna salads because they are easier to chew. She does not want to try Ensure or Glucerna supplements; she thinks they will taste bad and is concerned that they contain soy which she tries to avoid given hx of breast cancer. She agreed to try Pro-stat protein supplement with meals. Discussed ways to increase protein intake.   L groin wound is starting to dehisce. Plans for return to OR on 9/28 for L groin exploration. Labs reviewed. sodium 134 (L) CBG's: 100 today Medications reviewed and include colace, Trinsicon, Lasix.  Diet Order:  Diet heart healthy/carb modified Room service appropriate? Yes; Fluid consistency: Thin Diet NPO time specified Except for: Sips with Meds  Skin:  Wound (see comment) (non-healing L groin wound; VAC; multiple surgical incisions; MASD coccyx & sacrum)  Last BM:   9/26  Height:   Ht Readings from Last 1 Encounters:  02/08/17 5\' 3"  (1.6 m)    Weight:   Wt Readings from Last 1 Encounters:  02/21/17 232 lb 9.6 oz (105.5 kg)    Ideal Body Weight:  52.3 kg  BMI:  Body mass index is 41.2 kg/m.  Estimated Nutritional Needs:   Kcal:  1700-1900  Protein:  105-120 gm  Fluid:  2 L  EDUCATION NEEDS:   Education needs addressed (discussed good sources of protein with patient)  Molli Barrows, [place], [place], [place] Pager [phone] After Hours Pager [phone]

## 2017-02-21 NOTE — Progress Notes (Signed)
CARDIAC REHAB PHASE I   Pt in chair, states she has not walked without VAC in place, states wound is draining. Will hold ambulation until after tomorrows procedure. Will follow.   Lenna Sciara, RN, BSN 02/21/2017 2:13 PM

## 2017-02-21 NOTE — Anesthesia Preprocedure Evaluation (Addendum)
Anesthesia Evaluation  Patient identified by MRN, date of birth, ID band Patient awake    Reviewed: Allergy & Precautions, H&P , NPO status , Patient's Chart, lab work & pertinent test results  Airway Mallampati: II  TM Distance: >3 FB Neck ROM: Full    Dental no notable dental hx. (+) Poor Dentition, Dental Advisory Given   Pulmonary neg pulmonary ROS,    Pulmonary exam normal breath sounds clear to auscultation       Cardiovascular Exercise Tolerance: Good hypertension, Pt. on medications and Pt. on home beta blockers + CAD, + Cardiac Stents, + Peripheral Vascular Disease and +CHF   Rhythm:Regular Rate:Normal  S/p TAVR   Neuro/Psych negative neurological ROS  negative psych ROS   GI/Hepatic negative GI ROS, Neg liver ROS,   Endo/Other  diabetes, Type 2, Insulin DependentMorbid obesity  Renal/GU Renal InsufficiencyRenal diseasenegative Renal ROS  negative genitourinary   Musculoskeletal  (+) Arthritis , Osteoarthritis,    Abdominal   Peds  Hematology negative hematology ROS (+)   Anesthesia Other Findings   Reproductive/Obstetrics negative OB ROS                          Anesthesia Physical Anesthesia Plan  ASA: III  Anesthesia Plan: General   Post-op Pain Management:    Induction: Intravenous  PONV Risk Score and Plan: 4 or greater and Ondansetron, Dexamethasone and Midazolam  Airway Management Planned: Oral ETT and LMA  Additional Equipment:   Intra-op Plan:   Post-operative Plan: Extubation in OR  Informed Consent: I have reviewed the patients History and Physical, chart, labs and discussed the procedure including the risks, benefits and alternatives for the proposed anesthesia with the patient or authorized representative who has indicated his/her understanding and acceptance.   Dental advisory given  Plan Discussed with: Surgeon, CRNA and Anesthesiologist  Anesthesia  Plan Comments:       Anesthesia Quick Evaluation

## 2017-02-21 NOTE — Progress Notes (Signed)
Progress Note    02/21/2017 10:02 AM 13 Days Post-Op  Subjective:  Says she doesn't feel great today and feels a little down  Afebrile HR  50's-80's  540'G-867'Y systolic 19% RA  Vitals:   02/21/17 0606 02/21/17 0616  BP:  (!) 135/50  Pulse:    Resp: (!) 21   Temp:  98 F (36.7 C)  SpO2:  95%    Physical Exam: Cardiac:  regular Lungs:  Non labored Incisions:  Left groin with some dehiscence; malodorous Extremities:  Easily palpable left DP pulse   CBC    Component Value Date/Time   WBC 6.4 02/21/2017 0427   RBC 3.23 (L) 02/21/2017 0427   HGB 9.2 (L) 02/21/2017 0427   HGB 10.1 (L) 12/27/2016 0831   HGB 11.2 (L) 02/23/2015 1029   HCT 28.7 (L) 02/21/2017 0427   HCT 29.6 (L) 12/27/2016 0831   HCT 33.6 (L) 02/23/2015 1029   PLT 219 02/21/2017 0427   PLT 145 (L) 12/27/2016 0831   MCV 88.9 02/21/2017 0427   MCV 91 12/27/2016 0831   MCV 95.1 02/23/2015 1029   MCH 28.5 02/21/2017 0427   MCHC 32.1 02/21/2017 0427   RDW 15.2 02/21/2017 0427   RDW 15.4 12/27/2016 0831   RDW 12.9 02/23/2015 1029   LYMPHSABS 0.9 02/21/2017 0427   LYMPHSABS 1.4 12/27/2016 0831   LYMPHSABS 1.3 02/23/2015 1029   MONOABS 0.6 02/21/2017 0427   MONOABS 0.3 02/23/2015 1029   EOSABS 0.2 02/21/2017 0427   EOSABS 0.2 12/27/2016 0831   BASOSABS 0.0 02/21/2017 0427   BASOSABS 0.0 12/27/2016 0831   BASOSABS 0.0 02/23/2015 1029    BMET    Component Value Date/Time   NA 134 (L) 02/21/2017 0427   NA 140 01/04/2017 1101   NA 141 02/23/2015 1029   K 3.6 02/21/2017 0427   K 4.3 02/23/2015 1029   CL 100 (L) 02/21/2017 0427   CL 111 (H) 08/28/2012 1224   CO2 27 02/21/2017 0427   CO2 26 02/23/2015 1029   GLUCOSE 78 02/21/2017 0427   GLUCOSE 193 (H) 02/23/2015 1029   GLUCOSE 103 (H) 08/28/2012 1224   BUN 45 (H) 02/21/2017 0427   BUN 33 (H) 01/04/2017 1101   BUN 20.2 02/23/2015 1029   CREATININE 1.27 (H) 02/21/2017 0427   CREATININE 1.3 (H) 02/23/2015 1029   CALCIUM 8.6 (L) 02/21/2017  0427   CALCIUM 9.5 02/23/2015 1029   GFRNONAA 39 (L) 02/21/2017 0427   GFRAA 46 (L) 02/21/2017 0427    INR    Component Value Date/Time   INR 1.12 02/08/2017 1241     Intake/Output Summary (Last 24 hours) at 02/21/17 1002 Last data filed at 02/21/17 0600  Gross per 24 hour  Intake              210 ml  Output              900 ml  Net             -690 ml     Assessment:  81 y.o. female is s/p:  Left groin hematoma status post repair of left common femoral artery afterTAVRprocedure on 02/05/2017 and evacuation hematoma on 02/08/17 13 Days Post-Op  Plan: -left groin with ABD pad in place-wound starting to dehisce and malodorous.  Continues to be afebrile and WBC is normal.  Change left groin dressing q4h due to drainage.  She is not on abx. -will ask dietitian to see pt to maximize nutritional status  for wound healing.  She states she had 6 teeth pulled prior to her heart operation and is difficult to eat. 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  Npo after MN    Leontine Locket, PA-C [facility] [phone] 02/21/2017 10:02 AM

## 2017-02-22 ENCOUNTER — Encounter (HOSPITAL_COMMUNITY): Payer: Self-pay | Admitting: Certified Registered Nurse Anesthetist

## 2017-02-22 ENCOUNTER — Inpatient Hospital Stay (HOSPITAL_COMMUNITY): Payer: Medicare Other | Admitting: Anesthesiology

## 2017-02-22 ENCOUNTER — Ambulatory Visit: Payer: Medicare Other | Admitting: Hematology and Oncology

## 2017-02-22 ENCOUNTER — Encounter (HOSPITAL_COMMUNITY)
Admission: RE | Disposition: A | Discharge status: Skilled Nursing Facility | Payer: Self-pay | Source: Ambulatory Visit | Attending: Cardiovascular Disease

## 2017-02-22 DIAGNOSIS — I9789 Other postprocedural complications and disorders of the circulatory system, not elsewhere classified: Secondary | ICD-10-CM

## 2017-02-22 DIAGNOSIS — Z953 Presence of xenogenic heart valve: Secondary | ICD-10-CM

## 2017-02-22 HISTORY — PX: I & D EXTREMITY: SHX5045

## 2017-02-22 HISTORY — PX: APPLICATION OF WOUND VAC: SHX5189

## 2017-02-22 LAB — RENAL FUNCTION PANEL
Albumin: 2 g/dL — ABNORMAL LOW (ref 3.5–5.0)
Anion gap: 8 (ref 5–15)
BUN: 42 mg/dL — AB (ref 6–20)
CALCIUM: 8.5 mg/dL — AB (ref 8.9–10.3)
CO2: 25 mmol/L (ref 22–32)
Chloride: 101 mmol/L (ref 101–111)
Creatinine, Ser: 1.34 mg/dL — ABNORMAL HIGH (ref 0.44–1.00)
GFR calc Af Amer: 43 mL/min — ABNORMAL LOW (ref >60)
GFR calc non Af Amer: 37 mL/min — ABNORMAL LOW (ref >60)
GLUCOSE: 132 mg/dL — AB (ref 65–99)
Phosphorus: 2.7 mg/dL (ref 2.5–4.6)
Potassium: 3.9 mmol/L (ref 3.5–5.1)
SODIUM: 134 mmol/L — AB (ref 135–145)

## 2017-02-22 LAB — CBC WITH DIFFERENTIAL/PLATELET
Basophils Absolute: 0 10*3/uL (ref 0.0–0.1)
Basophils Relative: 0 %
Eosinophils Absolute: 0.2 10*3/uL (ref 0.0–0.7)
Eosinophils Relative: 2 %
HEMATOCRIT: 29.5 % — AB (ref 36.0–46.0)
Hemoglobin: 9.7 g/dL — ABNORMAL LOW (ref 12.0–15.0)
LYMPHS PCT: 11 %
Lymphs Abs: 0.8 10*3/uL (ref 0.7–4.0)
MCH: 29.6 pg (ref 26.0–34.0)
MCHC: 32.9 g/dL (ref 30.0–36.0)
MCV: 89.9 fL (ref 78.0–100.0)
MONO ABS: 0.8 10*3/uL (ref 0.1–1.0)
MONOS PCT: 11 %
NEUTROS ABS: 5.5 10*3/uL (ref 1.7–7.7)
Neutrophils Relative %: 76 %
Platelets: 241 10*3/uL (ref 150–400)
RBC: 3.28 MIL/uL — ABNORMAL LOW (ref 3.87–5.11)
RDW: 15.4 % (ref 11.5–15.5)
WBC: 7.2 10*3/uL (ref 4.0–10.5)

## 2017-02-22 LAB — BASIC METABOLIC PANEL
Anion gap: 8 (ref 5–15)
BUN: 44 mg/dL — ABNORMAL HIGH (ref 6–20)
CHLORIDE: 100 mmol/L — AB (ref 101–111)
CO2: 26 mmol/L (ref 22–32)
Calcium: 8.6 mg/dL — ABNORMAL LOW (ref 8.9–10.3)
Creatinine, Ser: 1.36 mg/dL — ABNORMAL HIGH (ref 0.44–1.00)
GFR calc Af Amer: 42 mL/min — ABNORMAL LOW (ref >60)
GFR calc non Af Amer: 36 mL/min — ABNORMAL LOW (ref >60)
GLUCOSE: 133 mg/dL — AB (ref 65–99)
POTASSIUM: 3.9 mmol/L (ref 3.5–5.1)
Sodium: 134 mmol/L — ABNORMAL LOW (ref 135–145)

## 2017-02-22 LAB — GLUCOSE, CAPILLARY
GLUCOSE-CAPILLARY: 188 mg/dL — AB (ref 65–99)
GLUCOSE-CAPILLARY: 199 mg/dL — AB (ref 65–99)
Glucose-Capillary: 106 mg/dL — ABNORMAL HIGH (ref 65–99)
Glucose-Capillary: 88 mg/dL (ref 65–99)
Glucose-Capillary: 123 mg/dL — ABNORMAL HIGH (ref 65–99)

## 2017-02-22 LAB — PROTIME-INR
INR: 1.15
Prothrombin Time: 14.6 seconds (ref 11.4–15.2)

## 2017-02-22 LAB — MAGNESIUM: Magnesium: 2 mg/dL (ref 1.7–2.4)

## 2017-02-22 SURGERY — IRRIGATION AND DEBRIDEMENT EXTREMITY
Anesthesia: General | Site: Groin | Laterality: Left

## 2017-02-22 MED ORDER — FENTANYL CITRATE (PF) 100 MCG/2ML IJ SOLN
INTRAMUSCULAR | Status: AC
  Administered 2017-02-22: 09:00:00
  Filled 2017-02-22: qty 2

## 2017-02-22 MED ORDER — FENTANYL CITRATE (PF) 250 MCG/5ML IJ SOLN
INTRAMUSCULAR | Status: AC
  Filled 2017-02-22: qty 5

## 2017-02-22 MED ORDER — ROCURONIUM BROMIDE 100 MG/10ML IV SOLN
INTRAVENOUS | Status: DC | PRN
  Administered 2017-02-22: 40 mg via INTRAVENOUS

## 2017-02-22 MED ORDER — ONDANSETRON HCL 4 MG/2ML IJ SOLN
INTRAMUSCULAR | Status: DC | PRN
  Administered 2017-02-22: 4 mg via INTRAVENOUS

## 2017-02-22 MED ORDER — VANCOMYCIN HCL 1000 MG IV SOLR
INTRAVENOUS | Status: DC | PRN
  Administered 2017-02-22: 1000 mg

## 2017-02-22 MED ORDER — FENTANYL CITRATE (PF) 100 MCG/2ML IJ SOLN
25.0000 ug | INTRAMUSCULAR | Status: DC | PRN
  Administered 2017-02-22 (×3): 25 ug via INTRAVENOUS

## 2017-02-22 MED ORDER — LACTATED RINGERS IV SOLN
INTRAVENOUS | Status: DC | PRN
  Administered 2017-02-22: 07:00:00 via INTRAVENOUS

## 2017-02-22 MED ORDER — ONDANSETRON HCL 4 MG/2ML IJ SOLN
INTRAMUSCULAR | Status: AC
  Filled 2017-02-22: qty 2

## 2017-02-22 MED ORDER — LIDOCAINE HCL (CARDIAC) 20 MG/ML IV SOLN
INTRAVENOUS | Status: DC | PRN
  Administered 2017-02-22: 60 mg via INTRAVENOUS

## 2017-02-22 MED ORDER — VANCOMYCIN HCL 1000 MG IV SOLR
INTRAVENOUS | Status: AC
  Filled 2017-02-22: qty 1000

## 2017-02-22 MED ORDER — GENTAMICIN SULFATE 40 MG/ML IJ SOLN
INTRAMUSCULAR | Status: AC
  Filled 2017-02-22: qty 6

## 2017-02-22 MED ORDER — PROPOFOL 10 MG/ML IV BOLUS
INTRAVENOUS | Status: AC
  Filled 2017-02-22: qty 20

## 2017-02-22 MED ORDER — FENTANYL CITRATE (PF) 100 MCG/2ML IJ SOLN
INTRAMUSCULAR | Status: DC | PRN
  Administered 2017-02-22: 100 ug via INTRAVENOUS

## 2017-02-22 MED ORDER — MIDAZOLAM HCL 2 MG/2ML IJ SOLN
INTRAMUSCULAR | Status: AC
  Filled 2017-02-22: qty 2

## 2017-02-22 MED ORDER — PROPOFOL 10 MG/ML IV BOLUS
INTRAVENOUS | Status: DC | PRN
  Administered 2017-02-22: 100 mg via INTRAVENOUS

## 2017-02-22 MED ORDER — PHENYLEPHRINE HCL 10 MG/ML IJ SOLN
INTRAVENOUS | Status: DC | PRN
  Administered 2017-02-22: 60 ug/min via INTRAVENOUS

## 2017-02-22 MED ORDER — 0.9 % SODIUM CHLORIDE (POUR BTL) OPTIME
TOPICAL | Status: DC | PRN
  Administered 2017-02-22: 1000 mL

## 2017-02-22 MED ORDER — SUCCINYLCHOLINE CHLORIDE 200 MG/10ML IV SOSY
PREFILLED_SYRINGE | INTRAVENOUS | Status: AC
  Filled 2017-02-22: qty 10

## 2017-02-22 MED ORDER — SUGAMMADEX SODIUM 200 MG/2ML IV SOLN
INTRAVENOUS | Status: DC | PRN
  Administered 2017-02-22: 200 mg via INTRAVENOUS

## 2017-02-22 MED ORDER — EPHEDRINE 5 MG/ML INJ
INTRAVENOUS | Status: AC
  Filled 2017-02-22: qty 10

## 2017-02-22 MED ORDER — GENTAMICIN SULFATE 40 MG/ML IJ SOLN
INTRAMUSCULAR | Status: DC | PRN
  Administered 2017-02-22: 240 mg

## 2017-02-22 MED ORDER — DEXAMETHASONE SODIUM PHOSPHATE 10 MG/ML IJ SOLN
INTRAMUSCULAR | Status: DC | PRN
  Administered 2017-02-22: 5 mg via INTRAVENOUS

## 2017-02-22 SURGICAL SUPPLY — 43 items
BANDAGE ACE 4X5 VEL STRL LF (GAUZE/BANDAGES/DRESSINGS) IMPLANT
BANDAGE ACE 6X5 VEL STRL LF (GAUZE/BANDAGES/DRESSINGS) IMPLANT
BNDG GAUZE ELAST 4 BULKY (GAUZE/BANDAGES/DRESSINGS) IMPLANT
CANISTER SUCT 3000ML PPV (MISCELLANEOUS) ×3 IMPLANT
CANISTER WOUND CARE 500ML ATS (WOUND CARE) ×1 IMPLANT
CLIP VESOCCLUDE MED 6/CT (CLIP) ×1 IMPLANT
CLIP VESOCCLUDE SM WIDE 6/CT (CLIP) ×1 IMPLANT
COVER SURGICAL LIGHT HANDLE (MISCELLANEOUS) ×2 IMPLANT
DRAPE EXTREMITY BILATERAL (DRAPES) IMPLANT
DRAPE HALF SHEET 40X57 (DRAPES) IMPLANT
DRAPE U-SHAPE 76X120 STRL (DRAPES) IMPLANT
DRSG VAC ATS LRG SENSATRAC (GAUZE/BANDAGES/DRESSINGS) ×1 IMPLANT
DRSG VAC ATS MED SENSATRAC (GAUZE/BANDAGES/DRESSINGS) ×1 IMPLANT
ELECT REM PT RETURN 9FT ADLT (ELECTROSURGICAL) ×2
ELECTRODE REM PT RTRN 9FT ADLT (ELECTROSURGICAL) ×1 IMPLANT
GAUZE SPONGE 4X4 12PLY STRL (GAUZE/BANDAGES/DRESSINGS) ×1 IMPLANT
GAUZE XEROFORM 5X9 LF (GAUZE/BANDAGES/DRESSINGS) IMPLANT
GLOVE BIOGEL PI IND STRL 7.5 (GLOVE) ×1 IMPLANT
GLOVE BIOGEL PI INDICATOR 7.5 (GLOVE) ×2
GLOVE SURG SS PI 7.5 STRL IVOR (GLOVE) ×3 IMPLANT
GOWN STRL REUS W/ TWL LRG LVL3 (GOWN DISPOSABLE) ×2 IMPLANT
GOWN STRL REUS W/ TWL XL LVL3 (GOWN DISPOSABLE) ×1 IMPLANT
GOWN STRL REUS W/TWL LRG LVL3 (GOWN DISPOSABLE) ×4
GOWN STRL REUS W/TWL XL LVL3 (GOWN DISPOSABLE) ×2
HANDPIECE INTERPULSE COAX TIP (DISPOSABLE) ×2
KIT BASIN OR (CUSTOM PROCEDURE TRAY) ×2 IMPLANT
KIT ROOM TURNOVER OR (KITS) ×2 IMPLANT
KIT STIMULAN RAPID CURE  10CC (Orthopedic Implant) ×1 IMPLANT
KIT STIMULAN RAPID CURE 10CC (Orthopedic Implant) IMPLANT
NS IRRIG 1000ML POUR BTL (IV SOLUTION) ×2 IMPLANT
PACK CV ACCESS (CUSTOM PROCEDURE TRAY) ×1 IMPLANT
PACK GENERAL/GYN (CUSTOM PROCEDURE TRAY) IMPLANT
PACK UNIVERSAL I (CUSTOM PROCEDURE TRAY) IMPLANT
PAD ARMBOARD 7.5X6 YLW CONV (MISCELLANEOUS) ×3 IMPLANT
SET HNDPC FAN SPRY TIP SCT (DISPOSABLE) IMPLANT
SPONGE LAP 18X18 X RAY DECT (DISPOSABLE) ×2 IMPLANT
SUT ETHILON 3 0 PS 1 (SUTURE) IMPLANT
SUT VIC AB 2-0 CTX 36 (SUTURE) IMPLANT
SUT VIC AB 3-0 SH 27 (SUTURE)
SUT VIC AB 3-0 SH 27X BRD (SUTURE) IMPLANT
SUT VICRYL 4-0 PS2 18IN ABS (SUTURE) IMPLANT
WATER STERILE IRR 1000ML POUR (IV SOLUTION) ×1 IMPLANT
YANKAUER SUCT BULB TIP NO VENT (SUCTIONS) ×1 IMPLANT

## 2017-02-22 NOTE — Anesthesia Procedure Notes (Signed)
Procedure Name: Intubation Date/Time: 02/22/2017 7:46 AM Performed by: Carney Living Pre-anesthesia Checklist: Patient identified, Emergency Drugs available, Suction available, Patient being monitored and Timeout performed Patient Re-evaluated:Patient Re-evaluated prior to induction Oxygen Delivery Method: Circle system utilized Preoxygenation: Pre-oxygenation with 100% oxygen Induction Type: IV induction Ventilation: Mask ventilation without difficulty Laryngoscope Size: Mac and 4 Grade View: Grade I Tube type: Oral Tube size: 7.5 mm Number of attempts: 1 Airway Equipment and Method: Stylet Placement Confirmation: ETT inserted through vocal cords under direct vision,  positive ETCO2 and breath sounds checked- equal and bilateral Secured at: 21 cm Tube secured with: Tape Dental Injury: Teeth and Oropharynx as per pre-operative assessment

## 2017-02-22 NOTE — Progress Notes (Addendum)
[place] VALVE TEAM   Patient Name: Carrie Monroe Date of Encounter: 02/22/2017  Primary Cardiologist: Dr. [facility] Problem List     Active Problems:   S/P TAVR (transcatheter aortic valve replacement)   Acute respiratory failure with hypoxia (Center)   Hemorrhagic shock (HCC)     Subjective   S/p wound exploration this AM. Doing okay. No specific complaints. Daughter in room .  Inpatient Medications    Scheduled Meds: . [MAR Hold] aspirin EC  81 mg Oral Daily  . [MAR Hold] clopidogrel  75 mg Oral Q breakfast  . [MAR Hold] colchicine  0.6 mg Oral BID  . [MAR Hold] docusate sodium  200 mg Oral BID  . [MAR Hold] feeding supplement (PRO-STAT SUGAR FREE 64)  30 mL Oral BID  . fentaNYL      . [MAR Hold] ferrous [id number] C-folic acid  1 capsule Oral TID PC  . [MAR Hold] furosemide  40 mg Oral Daily  . [MAR Hold] Gerhardt's butt cream   Topical BID  . [MAR Hold] insulin aspart  0-24 Units Subcutaneous TID AC & HS  . [MAR Hold] insulin detemir  28 Units Subcutaneous BID  . [MAR Hold] metoprolol tartrate  25 mg Oral BID  . [MAR Hold] multivitamin with minerals  1 tablet Oral Daily  . [MAR Hold] rosuvastatin  5 mg Oral q1800  . [MAR Hold] sodium chloride flush  3 mL Intravenous Q12H   Continuous Infusions: . sodium chloride    . [MAR Hold] lactated ringers     PRN Meds: [MAR Hold] docusate sodium, fentaNYL (SUBLIMAZE) injection, [MAR Hold] hydrocortisone, [MAR Hold] Influenza vac split quadrivalent PF, [MAR Hold] lactated ringers, [MAR Hold] metoprolol tartrate, [MAR Hold] ondansetron (ZOFRAN) IV, [MAR Hold] oxyCODONE-acetaminophen, [MAR Hold] pneumococcal 23 valent vaccine, [MAR Hold] sodium chloride flush, [MAR Hold] sorbitol   Vital Signs    Vitals:   02/22/17 0952 02/22/17 1000 02/22/17 1003 02/22/17 1006  BP: (!) 134/45  (!) 104/35 (!) 151/61  Pulse: 83  85   Resp: 17  15   Temp:  98.3  F (36.8 C)    TempSrc:      SpO2: 98%  94%   Weight:      Height:        Intake/Output Summary (Last 24 hours) at 02/22/17 1022 Last data filed at 02/22/17 0959  Gross per 24 hour  Intake             1440 ml  Output              775 ml  Net              665 ml   Filed Weights   02/20/17 0420 02/21/17 0606 02/22/17 0514  Weight: 244 lb 12.8 oz (111 kg) 232 lb 9.6 oz (105.5 kg) 234 lb 8 oz (106.4 kg)    Physical Exam   GEN: Well nourished, well developed, in no acute distress. Morbidly obese HEENT: Grossly normal.  Neck: Supple, no JVD, carotid bruits, or masses. Cardiac: RRR, 2/6 soft murmur @RUSB , rubs, or gallops. No clubbing, cyanosis, edema.  Radials/DP/PT 2+ and equal bilaterally.  Respiratory:  Respirations regular and unlabored, clear to auscultation bilaterally. GI: Soft, nontender, nondistended, BS + x 4. MS: no deformity or atrophy. Skin: warm and dry, no rash. Left groin with receeding hematoma and bandage with wound vac in place.  Neuro:  Strength and sensation are  intact. Psych: AAOx3.  Normal affect.  Labs    CBC  Recent Labs  02/21/17 0427 02/22/17 0247  WBC 6.4 7.2  NEUTROABS 4.8 5.5  HGB 9.2* 9.7*  HCT 28.7* 29.5*  MCV 88.9 89.9  PLT 219 315   Basic Metabolic Panel  Recent Labs  02/21/17 0427 02/22/17 0247  NA 134* 134*  134*  K 3.6 3.9  3.9  CL 100* 100*  101  CO2 27 26  25   GLUCOSE 78 133*  132*  BUN 45* 44*  42*  CREATININE 1.27* 1.36*  1.34*  CALCIUM 8.6* 8.6*  8.5*  MG 1.9 2.0  PHOS 2.8 2.7   Liver Function Tests  Recent Labs  02/21/17 0427 02/22/17 0247  ALBUMIN 1.9* 2.0*   No results for input(s): LIPASE, AMYLASE in the last 72 hours. Cardiac Enzymes No results for input(s): CKTOTAL, CKMB, CKMBINDEX, TROPONINI in the last 72 hours. BNP Invalid input(s): POCBNP D-Dimer No results for input(s): DDIMER in the last 72 hours. Hemoglobin A1C No results for input(s): HGBA1C in the last 72 hours. Fasting Lipid  Panel No results for input(s): CHOL, HDL, LDLCALC, TRIG, CHOLHDL, LDLDIRECT in the last 72 hours. Thyroid Function Tests No results for input(s): TSH, T4TOTAL, T3FREE, THYROIDAB in the last 72 hours.  Invalid input(s): FREET3  Telemetry    NSR, short run of SVT- Personally Reviewed  ECG    NSR, LBBB - Personally Reviewed  Radiology    No results found.  Cardiac Studies   Postoperative echo: 02/06/2017 LV EF: 50% -55% Study Conclusions - Left ventricle: The cavity size was normal. There was mild   concentric hypertrophy. Systolic function was normal. The   estimated ejection fraction was in the range of 50% to 55%. Wall   motion was normal; there were no regional wall motion   abnormalities. Doppler parameters are consistent with abnormal   left ventricular relaxation (grade 1 diastolic dysfunction). - Ventricular septum: Septal motion showed abnormal function and   dyssynergy. - Aortic valve: Medtronic CoreValve Evolut Pro (size 60mm) was   functioning normally. Peak velocity (S): 156 cm/s. Mean gradient   (S): 5 mm Hg. Valve area (Vmax): 1.38 cm^2. - Mitral valve: Moderately calcified annulus. - Pericardium, extracardiac: There was a left pleural effusion.  CT abdomen 02/13/17 IMPRESSION: 1. Large hematoma involving the right lateral abdominal wall measuring approximately 10 x 16 cm. 2. Retroperitoneal hematoma involving the left lower pelvis measuring approximately 9 x 7 x 8 cm. 3. Surgical drain in the left lateral abdominal wall without evidence of hematoma. 4. 4 mm right middle lobe lung nodule as noted previously. Please see prior recommendations for follow-up. 5. Nonobstructing bilateral renal calculi. 6. Large colonic stool burden. Scattered colonic diverticula without evidence of acute diverticulitis. 7. Likely post radiation skin thickening and trabecular thickening involving the left breast  Patient Profile     81 y.o. female with history of chronic  diastolic CHF, HTN, HLD, DM, gout, breast cancer, CAD, severe aortic stenosis and severe carotid artery disease who was admitted following TAVR on 02/05/17. Following the case she had an active bleed from her left groin that we were unable to control with manual hemostasis. She had a significant blood loss with shock and was taken to the OR on 9/11 for femoral artery repair. Her Hg dropped again and she then went back to the OR on 9/14 for evacuation of hematoma and Blake drain placement with primary closure. She has had a prolonged hospital course due  to groin wound.   Assessment & Plan    Severe AS: s/p successful TAVR with a 26 mm Medtronic CoreValve Evolut Pro via L TF approach on 02/05/17. Post operative echo 02/06/17 showed good valve placement with no PVL. ECG with new LBBB but no bradyarrhythmia. She has been resumed on her ASA/Plavix given DES x2 to her LAD and LCx 8/3 and TAVR  Acute on chronic diastolic CHF: she was up ~72 lbs from pre op. She was diuresed with IV lasix and weight came down nicely. She is now below her baseline weight and has been transitioned to home PO lasix.   Acute blood loss anemia: Her Hg had stabilized after ~18 PRBCs transfusions and two trips to the OR. Hg dropped to 6.6 on 9/19 and she was transfused 2U PRBCs. Hg has remained stable since that time.   Left groin hematoma: Post op she had a large hematoma with active bleeding resulting in hypovolemic shock requiring pressors and resuscitation with 18 units of pRBCs, platelets, cryo, FFP and closure of the artery in the OR on 9/11 and hematoma evacuation on 9/14. CT abdomen 9/19 showed a large abdominal hematoma and retroperitoneal hematoma. Her BP has remained stable. Wound vac and drain in place. No white count and afebrile but wound malodorous. She underwent wound exploration this morning with Dr. Trula Slade and wound vac placed.   Thrombocytopenia: resolved.   Acute respiratory failure/shock: now extubated and off  all pressors   AKI: creatinine back to baseline of 1.2-1.5   CAD: she underwent DES to mLAD, PCTA to oDiag, and DES to LCx on 12/28/16. Continue DAPT with ASA/plavix   Carotid artery disease: she will require R CEA after she recovers from TAVR  Severe physical debility w/ limited mobility: PT recommending SNF at discharge   Signed, Angelena Form, PA-C  02/22/2017, 10:22 AM   Patient seen, examined. Available data reviewed. Agree with findings, assessment, and plan as outlined by Nell Range, PA-C. Exam reveals a pleasant elderly woman in NAD. She is resting comfortably. JVP normal, lungs CTA, heart RRR with 2/6 SEM at the RUSB no diastolic murmur, abdomen soft, obese, NT. Distal extremities warm without edema. Left groin not examined tonight.   Overall stable. Labs reviewed with stable H/H and creatinine.  Appreciate vascular surgery care.   Sherren Mocha, M.D. 02/22/2017 7:18 PM

## 2017-02-22 NOTE — Progress Notes (Signed)
Returned from [employer] is drowsy responding to nurse. Wound vac intact to left groin, site with small amount of bruising, no hematoma noted,. Right groin with bruising, no hematoma.

## 2017-02-22 NOTE — H&P (View-Only) (Signed)
Subjective  -  Feels better today   Physical Exam:  Left groin dressing changed. Significant fluid from wound     Assessment/Plan:    Will place wound vac to left groin to help keep area dry.  May need operative exploration of wound if it decompensates. Likely d/c JP tomorrow  [name] 02/16/2017 1:10 PM --  Vitals:   02/16/17 0400 02/16/17 1045  BP: (!) 132/58 (!) 176/67  Pulse: 63 75  Resp: 20   Temp: 98.4 F (36.9 C)   SpO2: 96%     Intake/Output Summary (Last 24 hours) at 02/16/17 1310 Last data filed at 02/16/17 1046  Gross per 24 hour  Intake                3 ml  Output             1665 ml  Net            -1662 ml     Laboratory CBC    Component Value Date/Time   WBC 12.6 (H) 02/16/2017 0433   HGB 10.1 (L) 02/16/2017 0433   HGB 10.1 (L) 12/27/2016 0831   HGB 11.2 (L) 02/23/2015 1029   HCT 32.0 (L) 02/16/2017 0433   HCT 29.6 (L) 12/27/2016 0831   HCT 33.6 (L) 02/23/2015 1029   PLT 198 02/16/2017 0433   PLT 145 (L) 12/27/2016 0831    BMET    Component Value Date/Time   NA 136 02/16/2017 0433   NA 140 01/04/2017 1101   NA 141 02/23/2015 1029   K 4.4 02/16/2017 0433   K 4.3 02/23/2015 1029   CL 103 02/16/2017 0433   CL 111 (H) 08/28/2012 1224   CO2 25 02/16/2017 0433   CO2 26 02/23/2015 1029   GLUCOSE 88 02/16/2017 0433   GLUCOSE 193 (H) 02/23/2015 1029   GLUCOSE 103 (H) 08/28/2012 1224   BUN 55 (H) 02/16/2017 0433   BUN 33 (H) 01/04/2017 1101   BUN 20.2 02/23/2015 1029   CREATININE 1.53 (H) 02/16/2017 0433   CREATININE 1.3 (H) 02/23/2015 1029   CALCIUM 8.6 (L) 02/16/2017 0433   CALCIUM 9.5 02/23/2015 1029   GFRNONAA 31 (L) 02/16/2017 0433   GFRAA 36 (L) 02/16/2017 0433    COAG Lab Results  Component Value Date   INR 1.12 02/08/2017   INR 1.38 02/05/2017   INR 1.60 02/05/2017   No results found for: PTT  Antibiotics Anti-infectives    Start     Dose/Rate Route Frequency Ordered Stop   02/08/17 1015  ceFAZolin  (ANCEF) IVPB 1 g/50 mL premix    Comments:  Send with pt to OR   1 g 100 mL/hr over 30 Minutes Intravenous On call 02/08/17 1010 02/08/17 1045   02/06/17 1800  cefUROXime (ZINACEF) 1.5 g in dextrose 5 % 50 mL IVPB     1.5 g 100 mL/hr over 30 Minutes Intravenous Every 12 hours 02/06/17 1005 02/07/17 0550   02/06/17 1015  cefUROXime (ZINACEF) 1.5 g in dextrose 5 % 50 mL IVPB  Status:  Discontinued     1.5 g 100 mL/hr over 30 Minutes Intravenous Every 12 hours 02/06/17 1001 02/06/17 1005   02/05/17 2300  vancomycin (VANCOCIN) IVPB 1000 mg/200 mL premix     1,000 mg 200 mL/hr over 60 Minutes Intravenous  Once 02/05/17 1516 02/06/17 0120   02/05/17 2000  cefUROXime (ZINACEF) 1.5 g in dextrose 5 % 50 mL IVPB  Status:  Discontinued  1.5 g 100 mL/hr over 30 Minutes Intravenous Every 12 hours 02/05/17 1032 02/05/17 1516   02/05/17 1900  cefUROXime (ZINACEF) 1.5 g in dextrose 5 % 50 mL IVPB  Status:  Discontinued     1.5 g 100 mL/hr over 30 Minutes Intravenous Every 12 hours 02/05/17 1516 02/06/17 1001   02/05/17 1800  vancomycin (VANCOCIN) IVPB 1000 mg/200 mL premix  Status:  Discontinued     1,000 mg 200 mL/hr over 60 Minutes Intravenous  Once 02/05/17 1032 02/05/17 1516   02/05/17 0400  vancomycin (VANCOCIN) 1,250 mg in sodium chloride 0.9 % 250 mL IVPB  Status:  Discontinued     1,250 mg 166.7 mL/hr over 90 Minutes Intravenous To Surgery 02/04/17 1352 02/05/17 1032   02/05/17 0400  cefUROXime (ZINACEF) 1.5 g in dextrose 5 % 50 mL IVPB     1.5 g 100 mL/hr over 30 Minutes Intravenous To Surgery 02/04/17 1352 02/05/17 0811       V. Leia Alf, M.D. [facility] [place] Office: [phone] Pager:  [phone]

## 2017-02-22 NOTE — Interval H&P Note (Signed)
History and Physical Interval Note:  02/22/2017 7:25 AM  Carrie Monroe  has presented today for surgery, with the diagnosis of Post-op wound infection Left groin  T81.4XXA  The various methods of treatment have been discussed with the patient and family. After consideration of risks, benefits and other options for treatment, the patient has consented to  Procedure(s): IRRIGATION AND DEBRIDEMENT LEFT GROIN (Left) as a surgical intervention .  [name] history has been reviewed, patient examined, no change in status, stable for surgery.  I have reviewed the patient's chart and labs.  Questions were answered to the patient's satisfaction.     [name]

## 2017-02-22 NOTE — Op Note (Signed)
    Patient name: Carrie Monroe MRN: [id number] DOB: November 18, 1935 Sex: female  02/05/2017 - 02/22/2017 Pre-operative Diagnosis: left groin infection Post-operative diagnosis:  Same Surgeon:  [name] Assistants:  Nurse Procedure:   #!:  I&D of left groin, including skin and subcutaneous tissue   #2:  Placement of antibiotic beads in to the retroperitoneum and left groin   #3:  Application of wound vac Anesthesia:  Geneal Blood Loss:  See anesthesia record Specimens:  Cultures were sent  Findings:  Extensive fat necrosis.  There was purulent drainage up into the retroperitoneal space.  I used a pulse irrigator (3 L) and antibiotic solution (500cc) for irrigation.  No vessels were exposed  Indications:  The patient has had her left groin incision break down and has extensive drainage.  Operative exploration was advised.  Procedure:  The patient was identified in the holding area and taken to [place] [facility]  The patient was then placed supine on the table. general anesthesia was administered.  The patient was prepped and draped in the usual sterile fashion.  A time out was called and antibiotics were administered.  The previous left groin incision was opened by removing the existing staples and sutures.  Upon opening the incision, there was a foul odor which became very prominent.  There was extensive fat necrosis.  The drainage and necrosis extended up into the retroperitoneum.  All necrotic tissue was debrided away, until only healthy tissue remained.  The femoral vessels were not exposed.  I then used a pulse irrigator, using 308ml to irrigatge the wound.  I then used 500 cc of antibiotic irrigation.  Antibiotic beads (vancomycin and gentamycin) were then placed into the retroperitoneumn and groin incision.  I then placed both a large and a medium wound vac over the groin.  The patient tolerated the procedure well.   Disposition:  To PACU stable   V. [name], M.D. [facility] [place] Office: [phone] Pager:  [phone]

## 2017-02-22 NOTE — Anesthesia Postprocedure Evaluation (Signed)
Anesthesia Post Note  Patient: Carrie Monroe  Procedure(s) Performed: Procedure(s) (LRB): IRRIGATION AND DEBRIDEMENT LEFT GROIN (Left) APPLICATION OF WOUND VAC LEFT GROIN (Left)     Patient location during evaluation: PACU Anesthesia Type: General Level of consciousness: awake and alert Pain management: pain level controlled Vital Signs Assessment: post-procedure vital signs reviewed and stable Respiratory status: spontaneous breathing, nonlabored ventilation, respiratory function stable and patient connected to nasal cannula oxygen Cardiovascular status: blood pressure returned to baseline and stable Postop Assessment: no apparent nausea or vomiting Anesthetic complications: no    Last Vitals:  Vitals:   02/22/17 0937 02/22/17 0952  BP: (!) 128/52 (!) 134/45  Pulse: 85 83  Resp: 19 17  Temp:    SpO2: 98% 98%    Last Pain:  Vitals:   02/22/17 0945  TempSrc:   PainSc: 4                  Mili Piltz,W. EDMOND

## 2017-02-22 NOTE — H&P (View-Only) (Signed)
Pt in the bathroom.  Per the RN, the JP put out ~200cc/24hr, however, they felt it didn't have good suction and readjusted it and it has put out 100cc this morning.  Per RN, wound vac has good seal.     Leave JP drain in and continue wound vac. Will check on pt tomorrow.   Leontine Locket, [facility] 02/19/2017 10:51 AM   Brownish fluid in JP Will need to be present for [facility] change today Considering exploration of wound on Friday   [facility]

## 2017-02-22 NOTE — Interval H&P Note (Signed)
History and Physical Interval Note:  02/22/2017 7:24 AM  Carrie Monroe  has presented today for surgery, with the diagnosis of Post-op wound infection Left groin  T81.4XXA  The various methods of treatment have been discussed with the patient and family. After consideration of risks, benefits and other options for treatment, the patient has consented to  Procedure(s): IRRIGATION AND DEBRIDEMENT LEFT GROIN (Left) as a surgical intervention .  [name] history has been reviewed, patient examined, no change in status, stable for surgery.  I have reviewed the patient's chart and labs.  Questions were answered to the patient's satisfaction.     [name]

## 2017-02-22 NOTE — Transfer of Care (Signed)
Immediate Anesthesia Transfer of Care Note  Patient: [name]  Procedure(s) Performed: Procedure(s): IRRIGATION AND DEBRIDEMENT LEFT GROIN (Left) APPLICATION OF WOUND VAC LEFT GROIN (Left)  Patient Location: PACU  Anesthesia Type:General  Level of Consciousness: awake, alert , oriented and patient cooperative  Airway & Oxygen Therapy: Patient Spontanous Breathing and Patient connected to nasal cannula oxygen  Post-op Assessment: Report given to RN, Post -op Vital signs reviewed and stable and Patient moving all extremities X 4  Post vital signs: Reviewed and stable  Last Vitals:  Vitals:   02/21/17 2201 02/22/17 0514  BP:  (!) 144/56  Pulse:  77  Resp:  (!) 22  Temp: 37.4 C 37.4 C  SpO2:  94%    Last Pain:  Vitals:   02/22/17 0514  TempSrc: Oral  PainSc:       Patients Stated Pain Goal: 3 (28/11/88 6773)  Complications: No apparent anesthesia complications

## 2017-02-22 NOTE — Progress Notes (Signed)
PT Cancellation Note  Patient Details Name: Carrie Monroe MRN: [id number] DOB: 1936-02-25   Cancelled Treatment:    Reason Eval/Treat Not Completed: Patient at procedure or test/unavailable. Pt currently in OR. Will follow up when pt available/as schedule allows.   Thelma Comp 02/22/2017, 7:23 AM   Rolinda Roan, PT, DPT Acute Rehabilitation Services Pager: [phone]

## 2017-02-23 LAB — GLUCOSE, CAPILLARY
GLUCOSE-CAPILLARY: 193 mg/dL — AB (ref 65–99)
Glucose-Capillary: 183 mg/dL — ABNORMAL HIGH (ref 65–99)
Glucose-Capillary: 143 mg/dL — ABNORMAL HIGH (ref 65–99)
Glucose-Capillary: 187 mg/dL — ABNORMAL HIGH (ref 65–99)

## 2017-02-23 LAB — RENAL FUNCTION PANEL
ALBUMIN: 1.8 g/dL — AB (ref 3.5–5.0)
ANION GAP: 8 (ref 5–15)
BUN: 47 mg/dL — AB (ref 6–20)
CALCIUM: 8.8 mg/dL — AB (ref 8.9–10.3)
CO2: 25 mmol/L (ref 22–32)
CREATININE: 1.43 mg/dL — AB (ref 0.44–1.00)
Chloride: 100 mmol/L — ABNORMAL LOW (ref 101–111)
GFR calc Af Amer: 40 mL/min — ABNORMAL LOW (ref >60)
GFR, EST NON AFRICAN AMERICAN: 34 mL/min — AB (ref >60)
GLUCOSE: 186 mg/dL — AB (ref 65–99)
Phosphorus: 4.1 mg/dL (ref 2.5–4.6)
Potassium: 4.3 mmol/L (ref 3.5–5.1)
Sodium: 133 mmol/L — ABNORMAL LOW (ref 135–145)

## 2017-02-23 LAB — CBC WITH DIFFERENTIAL/PLATELET
BASOS PCT: 0 %
Basophils Absolute: 0 10*3/uL (ref 0.0–0.1)
EOS ABS: 0.1 10*3/uL (ref 0.0–0.7)
EOS PCT: 1 %
HCT: 25.9 % — ABNORMAL LOW (ref 36.0–46.0)
Hemoglobin: 8.2 g/dL — ABNORMAL LOW (ref 12.0–15.0)
Lymphocytes Relative: 13 %
Lymphs Abs: 1 10*3/uL (ref 0.7–4.0)
MCH: 28.6 pg (ref 26.0–34.0)
MCHC: 31.7 g/dL (ref 30.0–36.0)
MCV: 90.2 fL (ref 78.0–100.0)
MONO ABS: 0.6 10*3/uL (ref 0.1–1.0)
Monocytes Relative: 8 %
NEUTROS PCT: 78 %
Neutro Abs: 6 10*3/uL (ref 1.7–7.7)
PLATELETS: 219 10*3/uL (ref 150–400)
RBC: 2.87 MIL/uL — ABNORMAL LOW (ref 3.87–5.11)
RDW: 15.5 % (ref 11.5–15.5)
WBC: 7.6 10*3/uL (ref 4.0–10.5)

## 2017-02-23 LAB — MAGNESIUM: MAGNESIUM: 2 mg/dL (ref 1.7–2.4)

## 2017-02-23 NOTE — Progress Notes (Signed)
1320 Offered to walk with pt. Declined at this time. Not feeling up to it. Got her to use IS and she demonstrated 1250 ml. Encouraged her to use 10x an hour. PT to see later. Encouraged her to get OOB then if feeling better.  Graylon Good RN BSN 02/23/2017 1:23 PM

## 2017-02-23 NOTE — Progress Notes (Signed)
[place] VALVE TEAM   Patient Name: Carrie Monroe Date of Encounter: 02/23/2017  Primary Cardiologist: Dr. [facility] Problem List     Active Problems:   S/P TAVR (transcatheter aortic valve replacement)   Acute respiratory failure with hypoxia ([place])   Hemorrhagic shock (HCC)     Subjective   Had wound exploration yesterday without issue. Feeling well today without complaint.  Inpatient Medications    Scheduled Meds: . aspirin EC  81 mg Oral Daily  . clopidogrel  75 mg Oral Q breakfast  . colchicine  0.6 mg Oral BID  . docusate sodium  200 mg Oral BID  . feeding supplement (PRO-STAT SUGAR FREE 64)  30 mL Oral BID  . ferrous [id number] C-folic acid  1 capsule Oral TID PC  . furosemide  40 mg Oral Daily  . Gerhardt's butt cream   Topical BID  . insulin aspart  0-24 Units Subcutaneous TID AC & HS  . insulin detemir  28 Units Subcutaneous BID  . metoprolol tartrate  25 mg Oral BID  . multivitamin with minerals  1 tablet Oral Daily  . rosuvastatin  5 mg Oral q1800  . sodium chloride flush  3 mL Intravenous Q12H   Continuous Infusions: . sodium chloride    . lactated ringers     PRN Meds: docusate sodium, hydrocortisone, Influenza vac split quadrivalent PF, lactated ringers, metoprolol tartrate, ondansetron (ZOFRAN) IV, oxyCODONE-acetaminophen, pneumococcal 23 valent vaccine, sodium chloride flush, sorbitol   Vital Signs    Vitals:   02/22/17 2154 02/22/17 2200 02/23/17 0000 02/23/17 0357  BP: (!) 131/54   (!) 130/53  Pulse: 61 (!) 59 (!) 59 (!) 56  Resp: 19 19 18 17   Temp: 98.4 F (36.9 C)   97.8 F (36.6 C)  TempSrc: Axillary   Oral  SpO2: 95% 94% 94% 96%  Weight:    240 lb 9.6 oz (109.1 kg)  Height:        Intake/Output Summary (Last 24 hours) at 02/23/17 1041 Last data filed at 02/23/17 0830  Gross per 24 hour  Intake              840 ml  Output             1575 ml  Net              -735 ml   Filed Weights   02/21/17 0606 02/22/17 0514 02/23/17 0357  Weight: 232 lb 9.6 oz (105.5 kg) 234 lb 8 oz (106.4 kg) 240 lb 9.6 oz (109.1 kg)    Physical Exam   GEN: Well nourished, well developed, in no acute distress, Morbidly obese HEENT: normal  Neck: no JVD, carotid bruits, or masses Cardiac: RRR; no murmurs, rubs, or gallops,no edema  Respiratory:  clear to auscultation bilaterally, normal work of breathing GI: soft, nontender, nondistended, + BS MS: no deformity or atrophy  Skin: warm and dry, wound VAC in place over left groin site Neuro:  Strength and sensation are intact Psych: euthymic mood, full affect   Labs    CBC  Recent Labs  02/22/17 0247 02/23/17 0737  WBC 7.2 7.6  NEUTROABS 5.5 6.0  HGB 9.7* 8.2*  HCT 29.5* 25.9*  MCV 89.9 90.2  PLT 241 782   Basic Metabolic Panel  Recent Labs  02/22/17 0247 02/23/17 0737  NA 134*  134* 133*  K 3.9  3.9 4.3  CL 100*  101  100*  CO2 26  25 25   GLUCOSE 133*  132* 186*  BUN 44*  42* 47*  CREATININE 1.36*  1.34* 1.43*  CALCIUM 8.6*  8.5* 8.8*  MG 2.0 2.0  PHOS 2.7 4.1   Liver Function Tests  Recent Labs  02/22/17 0247 02/23/17 0737  ALBUMIN 2.0* 1.8*   No results for input(s): LIPASE, AMYLASE in the last 72 hours. Cardiac Enzymes No results for input(s): CKTOTAL, CKMB, CKMBINDEX, TROPONINI in the last 72 hours. BNP Invalid input(s): POCBNP D-Dimer No results for input(s): DDIMER in the last 72 hours. Hemoglobin A1C No results for input(s): HGBA1C in the last 72 hours. Fasting Lipid Panel No results for input(s): CHOL, HDL, LDLCALC, TRIG, CHOLHDL, LDLDIRECT in the last 72 hours. Thyroid Function Tests No results for input(s): TSH, T4TOTAL, T3FREE, THYROIDAB in the last 72 hours.  Invalid input(s): FREET3  Telemetry    NSR, short run of SVT- Personally Reviewed  ECG    NSR, LBBB - Personally Reviewed  Radiology    No results found.  Cardiac Studies    Postoperative echo: 02/06/2017 LV EF: 50% -55% Study Conclusions - Left ventricle: The cavity size was normal. There was mild   concentric hypertrophy. Systolic function was normal. The   estimated ejection fraction was in the range of 50% to 55%. Wall   motion was normal; there were no regional wall motion   abnormalities. Doppler parameters are consistent with abnormal   left ventricular relaxation (grade 1 diastolic dysfunction). - Ventricular septum: Septal motion showed abnormal function and   dyssynergy. - Aortic valve: Medtronic CoreValve Evolut Pro (size 59mm) was   functioning normally. Peak velocity (S): 156 cm/s. Mean gradient   (S): 5 mm Hg. Valve area (Vmax): 1.38 cm^2. - Mitral valve: Moderately calcified annulus. - Pericardium, extracardiac: There was a left pleural effusion.  CT abdomen 02/13/17 IMPRESSION: 1. Large hematoma involving the right lateral abdominal wall measuring approximately 10 x 16 cm. 2. Retroperitoneal hematoma involving the left lower pelvis measuring approximately 9 x 7 x 8 cm. 3. Surgical drain in the left lateral abdominal wall without evidence of hematoma. 4. 4 mm right middle lobe lung nodule as noted previously. Please see prior recommendations for follow-up. 5. Nonobstructing bilateral renal calculi. 6. Large colonic stool burden. Scattered colonic diverticula without evidence of acute diverticulitis. 7. Likely post radiation skin thickening and trabecular thickening involving the left breast  Patient Profile     81 y.o. female with history of chronic diastolic CHF, HTN, HLD, DM, gout, breast cancer, CAD, severe aortic stenosis and severe carotid artery disease who was admitted following TAVR on 02/05/17. Following the case she had an active bleed from her left groin that we were unable to control with manual hemostasis. She had a significant blood loss with shock and was taken to the OR on 9/11 for femoral artery repair. Her Hg dropped  again and she then went back to the OR on 9/14 for evacuation of hematoma and Blake drain placement with primary closure. She has had a prolonged hospital course due to groin wound.   Assessment & Plan    Severe AS: s/p successful TAVR with a 26 mm Medtronic CoreValve Evolut Pro via L TF approach on 02/05/17. Post operative echo 02/06/17 showed good valve placement with no PVL. EKG with new left bundle branch block post TAVR  implantation. Has resumed aspirin and Plavix with history of coronary stenting.  Acute on chronic diastolic CHF: Is -11 pounds since admission. Would  continue diuresis as per prior plan. Creatinine has remained stable.  Acute blood loss anemia: Hemoglobin has stabilized after transfusions. No changes at this time.  Left groin hematoma: Large hematoma postop from valve replacement. Had wound exploration and wound VAC placed yesterday. Hematoma plan per vascular surgery.  Thrombocytopenia: resolved.   Acute respiratory failure/shock: now extubated and off all pressors   AKI: creatinine back to baseline of 1.2-1.5   CAD: she underwent DES to mLAD, PCTA to oDiag, and DES to LCx on 12/28/16. Have restarted aspirin and Plavix.  Carotid artery disease: she Hermenegildo Clausen require R CEA after she recovers from TAVR  Severe physical debility w/ limited mobility: PT recommending SNF at discharge   Signed, Rollan Roger Meredith Leeds, MD  02/23/2017, 10:41 AM

## 2017-02-23 NOTE — Progress Notes (Signed)
Physical Therapy Treatment Patient Details Name: Carrie Monroe MRN: [id number] DOB: 1935-11-14 Today's Date: 02/23/2017    History of Present Illness 81 y.o. female with history of chronic diastolic CHF, HTN, HLD, DM, gout, breast cancer, CAD, severe aortic stenosis and severe carotid artery disease who was admitted following TAVR on 02/05/17. Following the case she had an active bleed from her left groin that we were unable to control with manual hemostasis. She had a significant blood loss with shock and was taken to the OR on 9/11 for femoral artery repair. Her Hg dropped again and she then went back to the OR on 9/14 for evacuation of hematoma and Blake drain placement with primary closure. Pt also now I&D of L groin wound with VAC placement on 9/28.    PT Comments    Pt making slow progress with mobility and only agreeable to transfer from the bed to the chair this session. Pt with one posterior LOB during transfers that required min A to maintain upright position. PT continuing to recommend pt d/c to SNF for ST rehab prior to returning home. Pt would continue to benefit from skilled physical therapy services at this time while admitted and after d/c to address the below listed limitations in order to improve overall safety and independence with functional mobility.    Follow Up Recommendations  SNF     Equipment Recommendations  Other (comment) (defer to next venue)    Recommendations for Other Services       Precautions / Restrictions Precautions Precautions: None Precaution Comments: wound VAC L groin Restrictions Weight Bearing Restrictions: No    Mobility  Bed Mobility Overal bed mobility: Needs Assistance Bed Mobility: Supine to Sit     Supine to sit: Min assist     General bed mobility comments: increased time and effort, use of bed rails, assist to elevate trunk to achieve sitting EOB  Transfers Overall transfer level: Needs assistance Equipment used:  Rolling walker (2 wheeled) Transfers: Sit to/from [facility] Sit to Stand: Min assist Stand pivot transfers: Min assist       General transfer comment: assist for stability; pt with LOB posteriorly during pivotal movements requiring min A to maintain upright position  Ambulation/Gait                 Stairs            Wheelchair Mobility    Modified Rankin (Stroke Patients Only)       Balance Overall balance assessment: Needs assistance Sitting-balance support: Feet supported Sitting balance-Leahy Scale: Fair Sitting balance - Comments: pt able to sit EOB with supervision   Standing balance support: Bilateral upper extremity supported Standing balance-Leahy Scale: Poor Standing balance comment: reliant on bilateral UEs on RW                            Cognition Arousal/Alertness: Awake/alert Behavior During Therapy: WFL for tasks assessed/performed Overall Cognitive Status: Within Functional Limits for tasks assessed                                        Exercises      General Comments        Pertinent Vitals/Pain Pain Assessment: Faces Faces Pain Scale: No hurt    Home Living  Prior Function            PT Goals (current goals can now be found in the care plan section) Acute Rehab PT Goals PT Goal Formulation: With patient Time For Goal Achievement: 02/25/17 Potential to Achieve Goals: Fair Progress towards PT goals: Progressing toward goals    Frequency    Min 3X/week      PT Plan Current plan remains appropriate    Co-evaluation              AM-PAC PT "6 Clicks" Daily Activity  Outcome Measure  Difficulty turning over in bed (including adjusting bedclothes, sheets and blankets)?: A Lot Difficulty moving from lying on back to sitting on the side of the bed? : Unable Difficulty sitting down on and standing up from a chair with arms (e.g.,  wheelchair, bedside commode, etc,.)?: Unable Help needed moving to and from a bed to chair (including a wheelchair)?: A Little Help needed walking in hospital room?: A Little Help needed climbing 3-5 steps with a railing? : A Little 6 Click Score: 13    End of Session Equipment Utilized During Treatment: Gait belt Activity Tolerance: Patient tolerated treatment well Patient left: in chair;with call bell/phone within reach Nurse Communication: Mobility status PT Visit Diagnosis: Unsteadiness on feet (R26.81);Other abnormalities of gait and mobility (R26.89);History of falling (Z91.81);Muscle weakness (generalized) (M62.81);Difficulty in walking, not elsewhere classified (R26.2) Pain - Right/Left: Left Pain - part of body:  (groin)     Time: 6712-4580 PT Time Calculation (min) (ACUTE ONLY): 17 min  Charges:  $Therapeutic Activity: 8-22 mins                    G Codes:       [place], [place], [place] 02/23/2017, 4:13 PM

## 2017-02-23 NOTE — Progress Notes (Signed)
   VASCULAR SURGERY ASSESSMENT & PLAN:   1 Day Post-Op s/p: Debridement of left groin wound and placement of VAC  VA change on Monday Wednesdays and Fridays.   SUBJECTIVE:   No complaints.   PHYSICAL EXAM:   Vitals:   02/22/17 2154 02/22/17 2200 02/23/17 0000 02/23/17 0357  BP: (!) 131/54   (!) 130/53  Pulse: 61 (!) 59 (!) 59 (!) 56  Resp: 19 19 18 17   Temp: 98.4 F (36.9 C)   97.8 F (36.6 C)  TempSrc: Axillary   Oral  SpO2: 95% 94% 94% 96%  Weight:    240 lb 9.6 oz (109.1 kg)  Height:       VAC with good seal.  Palpable left DP pulse  LABS:   Lab Results  Component Value Date   WBC 7.2 02/22/2017   HGB 9.7 (L) 02/22/2017   HCT 29.5 (L) 02/22/2017   MCV 89.9 02/22/2017   PLT 241 02/22/2017   Lab Results  Component Value Date   CREATININE 1.34 (H) 02/22/2017   CREATININE 1.36 (H) 02/22/2017   Lab Results  Component Value Date   INR 1.15 02/22/2017   CBG (last 3)   Recent Labs  02/22/17 1650 02/22/17 2122 02/23/17 0613  GLUCAP 188* 199* 193*    PROBLEM LIST:    Active Problems:   S/P TAVR (transcatheter aortic valve replacement)   Acute respiratory failure with hypoxia (HCC)   Hemorrhagic shock (HCC)   CURRENT MEDS:   . aspirin EC  81 mg Oral Daily  . clopidogrel  75 mg Oral Q breakfast  . colchicine  0.6 mg Oral BID  . docusate sodium  200 mg Oral BID  . feeding supplement (PRO-STAT SUGAR FREE 64)  30 mL Oral BID  . ferrous [id number] C-folic acid  1 capsule Oral TID PC  . furosemide  40 mg Oral Daily  . Gerhardt's butt cream   Topical BID  . insulin aspart  0-24 Units Subcutaneous TID AC & HS  . insulin detemir  28 Units Subcutaneous BID  . metoprolol tartrate  25 mg Oral BID  . multivitamin with minerals  1 tablet Oral Daily  . rosuvastatin  5 mg Oral q1800  . sodium chloride flush  3 mL Intravenous Q12H    Gae Gallop Beeper: [phone] Office: [phone] 02/23/2017

## 2017-02-24 ENCOUNTER — Encounter (HOSPITAL_COMMUNITY): Payer: Self-pay | Admitting: Surgery

## 2017-02-24 LAB — CBC WITH DIFFERENTIAL/PLATELET
BASOS ABS: 0 10*3/uL (ref 0.0–0.1)
BASOS PCT: 0 %
EOS ABS: 0.1 10*3/uL (ref 0.0–0.7)
Eosinophils Relative: 2 %
HEMATOCRIT: 24.5 % — AB (ref 36.0–46.0)
HEMOGLOBIN: 8 g/dL — AB (ref 12.0–15.0)
Lymphocytes Relative: 10 %
Lymphs Abs: 0.8 10*3/uL (ref 0.7–4.0)
MCH: 29.3 pg (ref 26.0–34.0)
MCHC: 32.7 g/dL (ref 30.0–36.0)
MCV: 89.7 fL (ref 78.0–100.0)
MONOS PCT: 11 %
Monocytes Absolute: 0.9 10*3/uL (ref 0.1–1.0)
NEUTROS ABS: 6.3 10*3/uL (ref 1.7–7.7)
NEUTROS PCT: 77 %
Platelets: 211 10*3/uL (ref 150–400)
RBC: 2.73 MIL/uL — ABNORMAL LOW (ref 3.87–5.11)
RDW: 15.8 % — AB (ref 11.5–15.5)
WBC: 8.2 10*3/uL (ref 4.0–10.5)

## 2017-02-24 LAB — GLUCOSE, CAPILLARY
GLUCOSE-CAPILLARY: 184 mg/dL — AB (ref 65–99)
Glucose-Capillary: 133 mg/dL — ABNORMAL HIGH (ref 65–99)
Glucose-Capillary: 144 mg/dL — ABNORMAL HIGH (ref 65–99)

## 2017-02-24 LAB — RENAL FUNCTION PANEL
ALBUMIN: 1.8 g/dL — AB (ref 3.5–5.0)
Anion gap: 6 (ref 5–15)
BUN: 52 mg/dL — ABNORMAL HIGH (ref 6–20)
CALCIUM: 8.7 mg/dL — AB (ref 8.9–10.3)
CO2: 27 mmol/L (ref 22–32)
CREATININE: 1.68 mg/dL — AB (ref 0.44–1.00)
Chloride: 101 mmol/L (ref 101–111)
GFR calc non Af Amer: 28 mL/min — ABNORMAL LOW (ref >60)
GFR, EST AFRICAN AMERICAN: 33 mL/min — AB (ref >60)
GLUCOSE: 144 mg/dL — AB (ref 65–99)
PHOSPHORUS: 3.7 mg/dL (ref 2.5–4.6)
Potassium: 4.3 mmol/L (ref 3.5–5.1)
SODIUM: 134 mmol/L — AB (ref 135–145)

## 2017-02-24 LAB — MAGNESIUM: MAGNESIUM: 1.8 mg/dL (ref 1.7–2.4)

## 2017-02-24 NOTE — Progress Notes (Signed)
Assumed care of patient at 1600, report given by outgoing RN, Inez Catalina.

## 2017-02-24 NOTE — Plan of Care (Signed)
Problem: Activity: Goal: Risk for activity intolerance will decrease Outcome: Progressing Pt. working with PT and to go to SNF short term.

## 2017-02-24 NOTE — Progress Notes (Signed)
Assumed care of patient at 2300, report given by outgoing nurse, Steffanie Dunn.

## 2017-02-24 NOTE — Progress Notes (Signed)
[place] VALVE TEAM   Patient Name: Carrie Monroe Date of Encounter: 02/24/2017  Primary Cardiologist: Dr. [facility] Problem List     Active Problems:   S/P TAVR (transcatheter aortic valve replacement)   Acute respiratory failure with hypoxia (HCC)   Hemorrhagic shock (HCC)     Subjective   Exploration last week with wound VAC in place. Patient without major complaint. No chest pain or shortness of breath.  Inpatient Medications    Scheduled Meds: . aspirin EC  81 mg Oral Daily  . clopidogrel  75 mg Oral Q breakfast  . colchicine  0.6 mg Oral BID  . docusate sodium  200 mg Oral BID  . feeding supplement (PRO-STAT SUGAR FREE 64)  30 mL Oral BID  . ferrous [id number] C-folic acid  1 capsule Oral TID PC  . furosemide  40 mg Oral Daily  . Gerhardt's butt cream   Topical BID  . insulin aspart  0-24 Units Subcutaneous TID AC & HS  . insulin detemir  28 Units Subcutaneous BID  . metoprolol tartrate  25 mg Oral BID  . multivitamin with minerals  1 tablet Oral Daily  . rosuvastatin  5 mg Oral q1800  . sodium chloride flush  3 mL Intravenous Q12H   Continuous Infusions: . sodium chloride    . lactated ringers     PRN Meds: docusate sodium, hydrocortisone, Influenza vac split quadrivalent PF, lactated ringers, metoprolol tartrate, ondansetron (ZOFRAN) IV, oxyCODONE-acetaminophen, pneumococcal 23 valent vaccine, sodium chloride flush, sorbitol   Vital Signs    Vitals:   02/23/17 0357 02/23/17 1331 02/23/17 2103 02/24/17 0526  BP: (!) 130/53 (!) 121/55 (!) 133/46 (!) 137/55  Pulse: (!) 56 67    Resp: 17 17    Temp: 97.8 F (36.6 C) 98.1 F (36.7 C) 98.5 F (36.9 C) 99.3 F (37.4 C)  TempSrc: Oral Oral Oral Oral  SpO2: 96% 95%    Weight: 240 lb 9.6 oz (109.1 kg)   233 lb 8 oz (105.9 kg)  Height:        Intake/Output Summary (Last 24 hours) at 02/24/17 0738 Last data filed at 02/24/17  0600  Gross per 24 hour  Intake              843 ml  Output             1501 ml  Net             -658 ml   Filed Weights   02/22/17 0514 02/23/17 0357 02/24/17 0526  Weight: 234 lb 8 oz (106.4 kg) 240 lb 9.6 oz (109.1 kg) 233 lb 8 oz (105.9 kg)    Physical Exam   GEN: Obese, chronically ill  HEENT: normal  Neck: no JVD, carotid bruits, or masses Cardiac: RRR; no murmurs, rubs, or gallops,no edema  Respiratory:  clear to auscultation bilaterally, normal work of breathing GI: soft, nontender, nondistended, + BS MS: no deformity or atrophy  Skin: warm and dry, Wound VAC in place over left groin Neuro:  Strength and sensation are intact Psych: euthymic mood, full affect    Labs    CBC  Recent Labs  02/23/17 0737 02/24/17 0342  WBC 7.6 8.2  NEUTROABS 6.0 6.3  HGB 8.2* 8.0*  HCT 25.9* 24.5*  MCV 90.2 89.7  PLT 219 585   Basic Metabolic Panel  Recent Labs  02/23/17 0737 02/24/17 0342  NA 133* 134*  K 4.3 4.3  CL 100* 101  CO2 25 27  GLUCOSE 186* 144*  BUN 47* 52*  CREATININE 1.43* 1.68*  CALCIUM 8.8* 8.7*  MG 2.0 1.8  PHOS 4.1 3.7   Liver Function Tests  Recent Labs  02/23/17 0737 02/24/17 0342  ALBUMIN 1.8* 1.8*   No results for input(s): LIPASE, AMYLASE in the last 72 hours. Cardiac Enzymes No results for input(s): CKTOTAL, CKMB, CKMBINDEX, TROPONINI in the last 72 hours. BNP Invalid input(s): POCBNP D-Dimer No results for input(s): DDIMER in the last 72 hours. Hemoglobin A1C No results for input(s): HGBA1C in the last 72 hours. Fasting Lipid Panel No results for input(s): CHOL, HDL, LDLCALC, TRIG, CHOLHDL, LDLDIRECT in the last 72 hours. Thyroid Function Tests No results for input(s): TSH, T4TOTAL, T3FREE, THYROIDAB in the last 72 hours.  Invalid input(s): FREET3  Telemetry    Sinus rhythm- Personally Reviewed  ECG    None new - Personally Reviewed  Radiology    No results found.  Cardiac Studies   Postoperative echo:  02/06/2017 LV EF: 50% -55% Study Conclusions - Left ventricle: The cavity size was normal. There was mild   concentric hypertrophy. Systolic function was normal. The   estimated ejection fraction was in the range of 50% to 55%. Wall   motion was normal; there were no regional wall motion   abnormalities. Doppler parameters are consistent with abnormal   left ventricular relaxation (grade 1 diastolic dysfunction). - Ventricular septum: Septal motion showed abnormal function and   dyssynergy. - Aortic valve: Medtronic CoreValve Evolut Pro (size 79mm) was   functioning normally. Peak velocity (S): 156 cm/s. Mean gradient   (S): 5 mm Hg. Valve area (Vmax): 1.38 cm^2. - Mitral valve: Moderately calcified annulus. - Pericardium, extracardiac: There was a left pleural effusion.  CT abdomen 02/13/17 IMPRESSION: 1. Large hematoma involving the right lateral abdominal wall measuring approximately 10 x 16 cm. 2. Retroperitoneal hematoma involving the left lower pelvis measuring approximately 9 x 7 x 8 cm. 3. Surgical drain in the left lateral abdominal wall without evidence of hematoma. 4. 4 mm right middle lobe lung nodule as noted previously. Please see prior recommendations for follow-up. 5. Nonobstructing bilateral renal calculi. 6. Large colonic stool burden. Scattered colonic diverticula without evidence of acute diverticulitis. 7. Likely post radiation skin thickening and trabecular thickening involving the left breast  Patient Profile     81 y.o. female with history of chronic diastolic CHF, HTN, HLD, DM, gout, breast cancer, CAD, severe aortic stenosis and severe carotid artery disease who was admitted following TAVR on 02/05/17. Following the case she had an active bleed from her left groin that we were unable to control with manual hemostasis. She had a significant blood loss with shock and was taken to the OR on 9/11 for femoral artery repair. Her Hg dropped again and she then went  back to the OR on 9/14 for evacuation of hematoma and Blake drain placement with primary closure. She has had a prolonged hospital course due to groin wound.   Assessment & Plan    Severe AS: s/p successful TAVR with a 26 mm Medtronic CoreValve Evolut Pro via L TF approach on 02/05/17. Postop echo showed normal valve placement with no postoperative leak. EKG with left bundle branch block post valve implantation.   Acute on chronic diastolic CHF: Is down 11 L since admission. Creatinine has bumped, Romilda Proby plan hold Lasix.  Acute blood loss anemia: Hemoglobin stable. No indication for transfusion  Left groin hematoma: Large postop hematoma. Exploration last week with plan for wound VAC change tomorrow.  Thrombocytopenia: resolved.   Acute respiratory failure/shock: now extubated and off all pressors   AKI: Creatinine rising. We'll plan to hold Lasix today.  CAD: she underwent DES to mLAD, PCTA to oDiag, and DES to LCx on 12/28/16. Restart aspirin and Plavix  Carotid artery disease: she Eline Geng require R CEA after she recovers from TAVR  Severe physical debility w/ limited mobility: Emrey Thornley need SNF discharge   Signed, Chea Malan Meredith Leeds, MD  02/24/2017, 7:38 AM

## 2017-02-24 NOTE — Progress Notes (Addendum)
[facility]  Subjective  - doing ok over all no new complaints   Objective (!) 137/55 67 99.3 F (37.4 C) (Oral) 17 95%  Intake/Output Summary (Last 24 hours) at 02/24/17 0801 Last data filed at 02/24/17 0600  Gross per 24 hour  Intake              843 ml  Output             1501 ml  Net             -658 ml    Left groin wound vac in place Plan to change vac tomorrow Palpable DP left   Assessment/Planning: 81 y.o. female is s/p:  Left groin hematoma status post repair of left common femoral artery afterTAVRprocedure on 02/05/2017 and evacuation hematoma on 02/08/17 16 Days Post-Op  POD # 2  Procedure:   #!:  I&D of left groin, including skin and subcutaneous tissue                         #2:  Placement of antibiotic beads in to the retroperitoneum and left groin                         #3:  Application of wound vac  Vac change tomorrow  Laurence Slate [facility] 02/24/2017 8:01 AM --  Laboratory Lab Results:  Recent Labs  02/23/17 0737 02/24/17 0342  WBC 7.6 8.2  HGB 8.2* 8.0*  HCT 25.9* 24.5*  PLT 219 211   BMET  Recent Labs  02/23/17 0737 02/24/17 0342  NA 133* 134*  K 4.3 4.3  CL 100* 101  CO2 25 27  GLUCOSE 186* 144*  BUN 47* 52*  CREATININE 1.43* 1.68*  CALCIUM 8.8* 8.7*   I have interviewed the patient and examined the patient. I agree with the findings by the PA. Palpable pedal pulse on left.  VAC with good seal. VAC change tomorrow  Gae Gallop, MD [phone]

## 2017-02-25 LAB — RENAL FUNCTION PANEL
Albumin: 1.9 g/dL — ABNORMAL LOW (ref 3.5–5.0)
Anion gap: 5 (ref 5–15)
BUN: 46 mg/dL — AB (ref 6–20)
CALCIUM: 9 mg/dL (ref 8.9–10.3)
CHLORIDE: 103 mmol/L (ref 101–111)
CO2: 26 mmol/L (ref 22–32)
CREATININE: 1.32 mg/dL — AB (ref 0.44–1.00)
GFR calc Af Amer: 44 mL/min — ABNORMAL LOW (ref >60)
GFR, EST NON AFRICAN AMERICAN: 38 mL/min — AB (ref >60)
Glucose, Bld: 84 mg/dL (ref 65–99)
Phosphorus: 3.7 mg/dL (ref 2.5–4.6)
Potassium: 4 mmol/L (ref 3.5–5.1)
Sodium: 134 mmol/L — ABNORMAL LOW (ref 135–145)

## 2017-02-25 LAB — GLUCOSE, CAPILLARY
GLUCOSE-CAPILLARY: 80 mg/dL (ref 65–99)
GLUCOSE-CAPILLARY: 139 mg/dL — AB (ref 65–99)
GLUCOSE-CAPILLARY: 108 mg/dL — AB (ref 65–99)
Glucose-Capillary: 121 mg/dL — ABNORMAL HIGH (ref 65–99)

## 2017-02-25 LAB — CBC WITH DIFFERENTIAL/PLATELET
BASOS PCT: 0 %
Basophils Absolute: 0 10*3/uL (ref 0.0–0.1)
EOS ABS: 0.1 10*3/uL (ref 0.0–0.7)
EOS PCT: 2 %
HCT: 23.9 % — ABNORMAL LOW (ref 36.0–46.0)
HEMOGLOBIN: 8 g/dL — AB (ref 12.0–15.0)
LYMPHS ABS: 1 10*3/uL (ref 0.7–4.0)
Lymphocytes Relative: 14 %
MCH: 29.6 pg (ref 26.0–34.0)
MCHC: 33.5 g/dL (ref 30.0–36.0)
MCV: 88.5 fL (ref 78.0–100.0)
Monocytes Absolute: 0.7 10*3/uL (ref 0.1–1.0)
Monocytes Relative: 10 %
Neutro Abs: 5.5 10*3/uL (ref 1.7–7.7)
Neutrophils Relative %: 74 %
PLATELETS: 218 10*3/uL (ref 150–400)
RBC: 2.7 MIL/uL — AB (ref 3.87–5.11)
RDW: 15.8 % — ABNORMAL HIGH (ref 11.5–15.5)
WBC: 7.4 10*3/uL (ref 4.0–10.5)

## 2017-02-25 LAB — MAGNESIUM: MAGNESIUM: 1.9 mg/dL (ref 1.7–2.4)

## 2017-02-25 NOTE — Care Management Important Message (Signed)
Important Message  Patient Details  Name: Carrie Monroe MRN: [id number] Date of Birth: 08-29-1935   Medicare Important Message Given:  Yes    Farrie Sann Abena 02/25/2017, 9:23 AM

## 2017-02-25 NOTE — Progress Notes (Signed)
Physical Therapy Treatment Patient Details Name: Carrie Monroe MRN: [id number] DOB: 03/15/1936 Today's Date: 02/25/2017    History of Present Illness 81 y.o. female with history of chronic diastolic CHF, HTN, HLD, DM, gout, breast cancer, CAD, severe aortic stenosis and severe carotid artery disease who was admitted following TAVR on 02/05/17. Following the case she had an active bleed from her left groin that we were unable to control with manual hemostasis. She had a significant blood loss with shock and was taken to the OR on 9/11 for femoral artery repair. Her Hg dropped again and she then went back to the OR on 9/14 for evacuation of hematoma and Blake drain placement with primary closure. Pt also now I&D of L groin wound with VAC placement on 9/28.    PT Comments    Patient was limited by fatigue this session and required mod A for bed mobility and functional transfers. Pt tolerated ambulating 147ft with min/mod A for balance and safety. Current plan remains appropriate.    Follow Up Recommendations  SNF     Equipment Recommendations  Other (comment) (defer to next venue)    Recommendations for Other Services       Precautions / Restrictions Precautions Precautions: None Precaution Comments: wound VAC L groin Restrictions Weight Bearing Restrictions: No    Mobility  Bed Mobility Overal bed mobility: Needs Assistance Bed Mobility: Supine to Sit     Supine to sit: Mod assist;HOB elevated     General bed mobility comments: cues for sequencing and technique; use of bed rail and bed pad to scoot hips toward EOB; assist to scoot hips and to elevate trunk into sitting  Transfers Overall transfer level: Needs assistance Equipment used: Rolling walker (2 wheeled) Transfers: Sit to/from Stand Sit to Stand: Mod assist         General transfer comment: assist to power up from EOB and BSC; cues for safe hand placement  Ambulation/Gait Ambulation/Gait assistance: Min  assist;Mod assist Ambulation Distance (Feet): 100 Feet Assistive device: Rolling walker (2 wheeled) Gait Pattern/deviations: Step-through pattern;Decreased stride length;Decreased dorsiflexion - right;Decreased dorsiflexion - left;Trunk flexed;Decreased step length - right;Decreased step length - left Gait velocity: decreased   General Gait Details: cues for proximity of RW, posture, and breathing technique; pt fatigued and required 3 standing rest breaks while leaning against wall in hallway and cues for breathing technique; pt with weak bilat LE noted   Stairs            Wheelchair Mobility    Modified Rankin (Stroke Patients Only)       Balance Overall balance assessment: Needs assistance Sitting-balance support: Feet supported Sitting balance-Leahy Scale: Fair     Standing balance support: Bilateral upper extremity supported Standing balance-Leahy Scale: Poor Standing balance comment: reliant on bilateral UEs on RW                            Cognition Arousal/Alertness: Awake/alert Behavior During Therapy: WFL for tasks assessed/performed Overall Cognitive Status: Within Functional Limits for tasks assessed                                        Exercises      General Comments General comments (skin integrity, edema, etc.): BP 167/53 (83), SpO2 94% on RA, and HR up to 120s with mobility  Pertinent Vitals/Pain Pain Assessment: Faces Faces Pain Scale: Hurts little more Pain Location: L groin with transitional movements Pain Descriptors / Indicators: Sore;Guarding;Grimacing Pain Intervention(s): Limited activity within patient's tolerance;Monitored during session;Repositioned    Home Living                      Prior Function            PT Goals (current goals can now be found in the care plan section) Acute Rehab PT Goals Patient Stated Goal: "I just feel blah today" Progress towards PT goals: Progressing toward  goals    Frequency    Min 3X/week      PT Plan Current plan remains appropriate    Co-evaluation              AM-PAC PT "6 Clicks" Daily Activity  Outcome Measure  Difficulty turning over in bed (including adjusting bedclothes, sheets and blankets)?: A Lot Difficulty moving from lying on back to sitting on the side of the bed? : Unable Difficulty sitting down on and standing up from a chair with arms (e.g., wheelchair, bedside commode, etc,.)?: Unable Help needed moving to and from a bed to chair (including a wheelchair)?: A Little Help needed walking in hospital room?: A Lot Help needed climbing 3-5 steps with a railing? : A Lot 6 Click Score: 11    End of Session Equipment Utilized During Treatment: Gait belt Activity Tolerance: Patient limited by fatigue Patient left: in chair;with call bell/phone within reach Nurse Communication: Mobility status PT Visit Diagnosis: Unsteadiness on feet (R26.81);Other abnormalities of gait and mobility (R26.89);History of falling (Z91.81);Muscle weakness (generalized) (M62.81);Difficulty in walking, not elsewhere classified (R26.2) Pain - Right/Left: Left Pain - part of body:  (groin)     Time: 3016-0109 PT Time Calculation (min) (ACUTE ONLY): 39 min  Charges:  $Gait Training: 23-37 mins $Therapeutic Activity: 8-22 mins                    G Codes:       Earney Navy, PTA Pager: [phone]     Darliss Cheney 02/25/2017, 9:41 AM

## 2017-02-25 NOTE — Progress Notes (Signed)
CARDIAC REHAB PHASE I   PRE:  Rate/Rhythm: 65 SR  BP:  Supine:   Sitting: 158/51  Standing:    SaO2: 96%RA  MODE:  Ambulation: 180 ft   POST:  Rate/Rhythm: 96 SR  BP:  Supine:   Sitting: 168/60  Standing:    SaO2: 96%RA 1352-1430 Pt assisted to bathroom and then walked 180 ft on RA with asst x 1 and one asst to follow with recliner. Pt sat twice to rest. Requested pain med after walking. Notified RN. Pt left in recliner with feet elevated. Wound vac intact. Emotional support given.    Graylon Good, RN BSN  02/25/2017 2:26 PM

## 2017-02-25 NOTE — Progress Notes (Addendum)
[facility]  Subjective  - Doing well, pleasant   Objective (!) 147/55 77 98.9 F (37.2 C) (Oral) 16 93%  Intake/Output Summary (Last 24 hours) at 02/25/17 0727 Last data filed at 02/25/17 0611  Gross per 24 hour  Intake              580 ml  Output             1800 ml  Net            -1220 ml    Palpable left DP Wound vac to suction left groin   Assessment/Planning: 81 y.o.femaleis s/p:  Left groin hematoma status post repair of left common femoral artery afterTAVRprocedure on 02/05/2017 and evacuation hematoma on 02/08/17 16 Days Post-Op  POD # 2  Procedure:#!: I&D of left groin, including skin and subcutaneous tissue #2: Placement of antibiotic beads in to the retroperitoneum and left groin #3: Application of wound vac  Vac change today I will let Dr. Trula Slade do vac change.    Laurence Slate [facility] 02/25/2017 7:27 AM --  Laboratory Lab Results:  Recent Labs  02/24/17 0342 02/25/17 0302  WBC 8.2 7.4  HGB 8.0* 8.0*  HCT 24.5* 23.9*  PLT 211 218   BMET  Recent Labs  02/24/17 0342 02/25/17 0302  NA 134* 134*  K 4.3 4.0  CL 101 103  CO2 27 26  GLUCOSE 144* 84  BUN 52* 46*  CREATININE 1.68* 1.32*  CALCIUM 8.7* 9.0    COAG Lab Results  Component Value Date   INR 1.15 02/22/2017   INR 1.12 02/08/2017   INR 1.38 02/05/2017   No results found for: PTT  Wound vac did not get changed today.  I have spoken with the nirses, and we will schedule her to have this changed tomorrow by the wound service or nursing staff.  Please contact me with the change so I can look at the wound.  Patient does not feel comfortable going home and would therefore benefit from SNF or CIR.  [name]

## 2017-02-25 NOTE — Progress Notes (Signed)
[facility] VALVE TEAM   Patient Name: Carrie Monroe Date of Encounter: 02/25/2017  Primary Cardiologist: Dr. [facility] Problem List     Active Problems:   S/P TAVR (transcatheter aortic valve replacement)   Acute respiratory failure with hypoxia (HCC)   Hemorrhagic shock (HCC)     Subjective   Just feels tired.   Inpatient Medications    Scheduled Meds: . aspirin EC  81 mg Oral Daily  . clopidogrel  75 mg Oral Q breakfast  . colchicine  0.6 mg Oral BID  . docusate sodium  200 mg Oral BID  . feeding supplement (PRO-STAT SUGAR FREE 64)  30 mL Oral BID  . ferrous [id number] C-folic acid  1 capsule Oral TID PC  . Gerhardt's butt cream   Topical BID  . insulin aspart  0-24 Units Subcutaneous TID AC & HS  . insulin detemir  28 Units Subcutaneous BID  . metoprolol tartrate  25 mg Oral BID  . multivitamin with minerals  1 tablet Oral Daily  . rosuvastatin  5 mg Oral q1800  . sodium chloride flush  3 mL Intravenous Q12H   Continuous Infusions: . sodium chloride    . lactated ringers     PRN Meds: docusate sodium, hydrocortisone, Influenza vac split quadrivalent PF, lactated ringers, metoprolol tartrate, ondansetron (ZOFRAN) IV, oxyCODONE-acetaminophen, pneumococcal 23 valent vaccine, sodium chloride flush, sorbitol   Vital Signs    Vitals:   02/24/17 2115 02/25/17 0444 02/25/17 0644 02/25/17 0848  BP: (!) 158/59 (!) 147/55  (!) 126/41  Pulse: 75 77  82  Resp: 16   19  Temp: 98.7 F (37.1 C) 98.9 F (37.2 C)  99.7 F (37.6 C)  TempSrc: Oral Oral  Oral  SpO2: 95% 93%  96%  Weight:   228 lb 6.4 oz (103.6 kg)   Height:        Intake/Output Summary (Last 24 hours) at 02/25/17 0924 Last data filed at 02/25/17 4034  Gross per 24 hour  Intake              480 ml  Output             1800 ml  Net            -1320 ml   Filed Weights   02/23/17 0357 02/24/17 0526 02/25/17 0644  Weight:  240 lb 9.6 oz (109.1 kg) 233 lb 8 oz (105.9 kg) 228 lb 6.4 oz (103.6 kg)    Physical Exam   GEN: Well nourished, well developed, in no acute distress. Morbidly obese HEENT: Grossly normal.  Neck: Supple, no JVD, carotid bruits, or masses. Cardiac: RRR, 2/6 soft murmur @RUSB , rubs, or gallops. No clubbing, cyanosis, edema.  Radials/DP/PT 2+ and equal bilaterally.  Respiratory:  Respirations regular and unlabored, clear to auscultation bilaterally. GI: Soft, nontender, nondistended, BS + x 4. MS: no deformity or atrophy. Skin: warm and dry, no rash. Left groin with receeding hematoma and bandage with wound vac in place.  Neuro:  Strength and sensation are intact. Psych: AAOx3.  Normal affect.  Labs    CBC  Recent Labs  02/24/17 0342 02/25/17 0302  WBC 8.2 7.4  NEUTROABS 6.3 5.5  HGB 8.0* 8.0*  HCT 24.5* 23.9*  MCV 89.7 88.5  PLT 211 742   Basic Metabolic Panel  Recent Labs  02/24/17 0342 02/25/17 0302  NA 134* 134*  K 4.3 4.0  CL 101 103  CO2 27 26  GLUCOSE 144* 84  BUN 52* 46*  CREATININE 1.68* 1.32*  CALCIUM 8.7* 9.0  MG 1.8 1.9  PHOS 3.7 3.7   Liver Function Tests  Recent Labs  02/24/17 0342 02/25/17 0302  ALBUMIN 1.8* 1.9*   No results for input(s): LIPASE, AMYLASE in the last 72 hours. Cardiac Enzymes No results for input(s): CKTOTAL, CKMB, CKMBINDEX, TROPONINI in the last 72 hours. BNP Invalid input(s): POCBNP D-Dimer No results for input(s): DDIMER in the last 72 hours. Hemoglobin A1C No results for input(s): HGBA1C in the last 72 hours. Fasting Lipid Panel No results for input(s): CHOL, HDL, LDLCALC, TRIG, CHOLHDL, LDLDIRECT in the last 72 hours. Thyroid Function Tests No results for input(s): TSH, T4TOTAL, T3FREE, THYROIDAB in the last 72 hours.  Invalid input(s): FREET3  Telemetry    NSR, short run of SVT- Personally Reviewed  ECG    NSR, LBBB - Personally Reviewed  Radiology    No results found.  Cardiac Studies    Postoperative echo: 02/06/2017 LV EF: 50% -55% Study Conclusions - Left ventricle: The cavity size was normal. There was mild   concentric hypertrophy. Systolic function was normal. The   estimated ejection fraction was in the range of 50% to 55%. Wall   motion was normal; there were no regional wall motion   abnormalities. Doppler parameters are consistent with abnormal   left ventricular relaxation (grade 1 diastolic dysfunction). - Ventricular septum: Septal motion showed abnormal function and   dyssynergy. - Aortic valve: Medtronic CoreValve Evolut Pro (size 93mm) was   functioning normally. Peak velocity (S): 156 cm/s. Mean gradient   (S): 5 mm Hg. Valve area (Vmax): 1.38 cm^2. - Mitral valve: Moderately calcified annulus. - Pericardium, extracardiac: There was a left pleural effusion.  CT abdomen 02/13/17 IMPRESSION: 1. Large hematoma involving the right lateral abdominal wall measuring approximately 10 x 16 cm. 2. Retroperitoneal hematoma involving the left lower pelvis measuring approximately 9 x 7 x 8 cm. 3. Surgical drain in the left lateral abdominal wall without evidence of hematoma. 4. 4 mm right middle lobe lung nodule as noted previously. Please see prior recommendations for follow-up. 5. Nonobstructing bilateral renal calculi. 6. Large colonic stool burden. Scattered colonic diverticula without evidence of acute diverticulitis. 7. Likely post radiation skin thickening and trabecular thickening involving the left breast  Patient Profile     81 y.o. female with history of chronic diastolic CHF, HTN, HLD, DM, gout, breast cancer, CAD, severe aortic stenosis and severe carotid artery disease who was admitted following TAVR on 02/05/17. Following the case she had an active bleed from her left groin that we were unable to control with manual hemostasis. She had a significant blood loss with shock and was taken to the OR on 9/11 for femoral artery repair. Her Hg dropped  again and she then went back to the OR on 9/14 for evacuation of hematoma and Blake drain placement with primary closure. She has had a prolonged hospital course due to groin wound.   Assessment & Plan    Severe AS: s/p successful TAVR with a 26 mm Medtronic CoreValve Evolut Pro via L TF approach on 02/05/17. Post operative echo 02/06/17 showed good valve placement with no PVL. ECG with new LBBB but no bradyarrhythmia. She has been resumed on her ASA/Plavix given DES x2 to her LAD and LCx 8/3 and TAVR  Acute on chronic diastolic CHF: she had post op volume overload and was diuresed with IV lasix. Now she  is well below her pre op weight of 246lbs. Her home dose of lasix 40mg  daily was held yesterday given rise in her creat. Will continue to hold off for now.   Acute blood loss anemia: Her Hg had stabilized after ~18 PRBCs transfusions and two trips to the OR. Hg dropped to 6.6 on 9/19 and she was transfused 2U PRBCs. Hg has remained stable since that time.   Left groin hematoma: Post op she had a large hematoma with active bleeding resulting in hypovolemic shock requiring pressors and resuscitation with 18 units of pRBCs, platelets, cryo, FFP and closure of the artery in the OR on 9/11 and hematoma evacuation on 9/14. CT abdomen 9/19 showed a large abdominal hematoma and retroperitoneal hematoma. She went back to the OR on 9/28 for I&D, placement of Abx beads and application of wound vac. Wound vac to be changed today.    Acute respiratory failure/shock: now extubated and off all pressors   AKI: creatinine back to baseline of 1.2-1.5   CAD: she underwent DES to mLAD, PCTA to oDiag, and DES to LCx on 12/28/16. Continue DAPT with ASA/plavix   Carotid artery disease: she will require R CEA after she recovers from TAVR  Severe physical debility w/ limited mobility: She will need SNF at discharge. Will be up to [facility] when she can go, stable from a cardiac standpoint.  Signed, Angelena Form, PA-C   02/25/2017, 9:24 AM   I have personally seen and examined this patient with Angelena Form, PA-C. I agree with the assessment and plan as outlined above. Ms. Strader is doing well tdoay. She has no complaints. BP is stable. Labs reviewed and stable. Appreciate VVS input into wound. She will need SNF placement at discharge. Discharge pending wound management and plan.   Lauree Chandler 02/25/2017 10:01 AM'

## 2017-02-26 LAB — CBC WITH DIFFERENTIAL/PLATELET
Basophils Absolute: 0 10*3/uL (ref 0.0–0.1)
Basophils Relative: 0 %
EOS ABS: 0.2 10*3/uL (ref 0.0–0.7)
Eosinophils Relative: 2 %
HEMATOCRIT: 24.4 % — AB (ref 36.0–46.0)
HEMOGLOBIN: 8 g/dL — AB (ref 12.0–15.0)
LYMPHS ABS: 0.8 10*3/uL (ref 0.7–4.0)
Lymphocytes Relative: 9 %
MCH: 29.1 pg (ref 26.0–34.0)
MCHC: 32.8 g/dL (ref 30.0–36.0)
MCV: 88.7 fL (ref 78.0–100.0)
Monocytes Absolute: 1.1 10*3/uL — ABNORMAL HIGH (ref 0.1–1.0)
Monocytes Relative: 12 %
NEUTROS ABS: 6.7 10*3/uL (ref 1.7–7.7)
NEUTROS PCT: 77 %
Platelets: 206 10*3/uL (ref 150–400)
RBC: 2.75 MIL/uL — AB (ref 3.87–5.11)
RDW: 15.6 % — ABNORMAL HIGH (ref 11.5–15.5)
WBC: 8.8 10*3/uL (ref 4.0–10.5)

## 2017-02-26 LAB — GLUCOSE, CAPILLARY
GLUCOSE-CAPILLARY: 83 mg/dL (ref 65–99)
GLUCOSE-CAPILLARY: 126 mg/dL — AB (ref 65–99)
Glucose-Capillary: 123 mg/dL — ABNORMAL HIGH (ref 65–99)
Glucose-Capillary: 129 mg/dL — ABNORMAL HIGH (ref 65–99)

## 2017-02-26 LAB — RENAL FUNCTION PANEL
ANION GAP: 7 (ref 5–15)
Albumin: 1.8 g/dL — ABNORMAL LOW (ref 3.5–5.0)
BUN: 39 mg/dL — ABNORMAL HIGH (ref 6–20)
CHLORIDE: 100 mmol/L — AB (ref 101–111)
CO2: 25 mmol/L (ref 22–32)
CREATININE: 1.18 mg/dL — AB (ref 0.44–1.00)
Calcium: 9.1 mg/dL (ref 8.9–10.3)
GFR, EST AFRICAN AMERICAN: 49 mL/min — AB (ref >60)
GFR, EST NON AFRICAN AMERICAN: 43 mL/min — AB (ref >60)
Glucose, Bld: 100 mg/dL — ABNORMAL HIGH (ref 65–99)
Phosphorus: 3.6 mg/dL (ref 2.5–4.6)
Potassium: 4 mmol/L (ref 3.5–5.1)
Sodium: 132 mmol/L — ABNORMAL LOW (ref 135–145)

## 2017-02-26 LAB — MAGNESIUM: MAGNESIUM: 1.9 mg/dL (ref 1.7–2.4)

## 2017-02-26 MED ORDER — DEXTROSE 5 % IV SOLN
2.0000 g | INTRAVENOUS | Status: DC
  Administered 2017-02-26 – 2017-02-27 (×2): 2 g via INTRAVENOUS
  Filled 2017-02-26 (×2): qty 2

## 2017-02-26 MED ORDER — MORPHINE SULFATE (PF) 2 MG/ML IV SOLN
2.0000 mg | Freq: Once | INTRAVENOUS | Status: AC
  Administered 2017-02-26: 2 mg via INTRAVENOUS
  Filled 2017-02-26: qty 1

## 2017-02-26 MED ORDER — OXYCODONE-ACETAMINOPHEN 5-325 MG PO TABS
1.0000 | ORAL_TABLET | ORAL | Status: DC | PRN
  Administered 2017-02-26: 1 via ORAL
  Administered 2017-02-27: 2 via ORAL
  Administered 2017-02-27: 1 via ORAL
  Administered 2017-02-27: 2 via ORAL
  Filled 2017-02-26 (×2): qty 1
  Filled 2017-02-26 (×2): qty 2

## 2017-02-26 MED ORDER — FUROSEMIDE 40 MG PO TABS
40.0000 mg | ORAL_TABLET | Freq: Every day | ORAL | Status: DC
  Administered 2017-02-26 – 2017-02-27 (×2): 40 mg via ORAL
  Filled 2017-02-26 (×2): qty 1

## 2017-02-26 MED ORDER — MORPHINE SULFATE (PF) 2 MG/ML IV SOLN: 1.0000 mg | INTRAVENOUS | Status: DC | PRN

## 2017-02-26 MED ORDER — HYDROMORPHONE HCL 1 MG/ML IJ SOLN: 0.5000 mg | INTRAMUSCULAR | Status: DC | PRN

## 2017-02-26 NOTE — Progress Notes (Signed)
[place] to see pt to walk. Pt stated not up to it at this time. Had wound vac changed and had been up in chair. Will follow up after lunch as time permits. Graylon Good RN BSN 02/26/2017 11:07 AM

## 2017-02-26 NOTE — Consult Note (Signed)
[place] Nurse wound consult note Reason for Consult: first post op dressing change Wound type: surgical left groin, s/p debridement and placement of antibiotic beads Pressure Injury POA: NA Measurement:16cm x 6cm x 6.5cm  Wound IFO:YDXAJOI, dusky but bleeding, antibiotic beads present, some slough noted on the proximal edge of the wound bed. Dr. Trula Slade at the bedside to assess  Drainage (amount, consistency, odor) canister full in the room, serosanguinous, dark  Periwound: intact  Dressing procedure/placement/frequency: 2pc of black foam used, double layer. Barrier strip used in the skin folds at the medial and lateral sides of the wound. Seal obtained at 126mmHG. Patient received IV pain meds during dressing change.   WOC will change dressing again on Wednesday to get on M/W/F schedule. Explained this to the patient and rationale for consecutive dressing changes.  [place] nurse will follow along with you for complex NPWT dressing change.   [place], [place], [place]

## 2017-02-26 NOTE — Progress Notes (Signed)
[place] again to walk. Pt still not feeling up to it. Encouraged her to get OOB later to recliner. Encouraged IS. Graylon Good RN BSN 02/26/2017 1:40 PM

## 2017-02-26 NOTE — Progress Notes (Signed)
[place] VALVE TEAM   Patient Name: Carrie Monroe Date of Encounter: 02/26/2017  Primary Cardiologist: Dr. [facility] Problem List     Active Problems:   S/P TAVR (transcatheter aortic valve replacement)   Acute respiratory failure with hypoxia (HCC)   Hemorrhagic shock (HCC)     Subjective   Doing okay. [birth year] today   Inpatient Medications    Scheduled Meds: . aspirin EC  81 mg Oral Daily  . clopidogrel  75 mg Oral Q breakfast  . colchicine  0.6 mg Oral BID  . docusate sodium  200 mg Oral BID  . feeding supplement (PRO-STAT SUGAR FREE 64)  30 mL Oral BID  . ferrous [id number] C-folic acid  1 capsule Oral TID PC  . Gerhardt's butt cream   Topical BID  . insulin aspart  0-24 Units Subcutaneous TID AC & HS  . insulin detemir  28 Units Subcutaneous BID  . metoprolol tartrate  25 mg Oral BID  . multivitamin with minerals  1 tablet Oral Daily  . rosuvastatin  5 mg Oral q1800  . sodium chloride flush  3 mL Intravenous Q12H   Continuous Infusions: . sodium chloride    . cefTRIAXone (ROCEPHIN)  IV    . lactated ringers     PRN Meds: docusate sodium, hydrocortisone, Influenza vac split quadrivalent PF, lactated ringers, metoprolol tartrate, morphine injection, ondansetron (ZOFRAN) IV, oxyCODONE-acetaminophen, pneumococcal 23 valent vaccine, sodium chloride flush, sorbitol   Vital Signs    Vitals:   02/25/17 0848 02/25/17 1205 02/25/17 2020 02/26/17 0404  BP: (!) 126/41 (!) 122/110 (!) 156/60   Pulse: 82 69 76   Resp: 19 19 (!) 22 19  Temp: 99.7 F (37.6 C) 99.5 F (37.5 C) 99.4 F (37.4 C)   TempSrc: Oral Oral Oral   SpO2: 96% 96% 96%   Weight:    250 lb 6.4 oz (113.6 kg)  Height:        Intake/Output Summary (Last 24 hours) at 02/26/17 1121 Last data filed at 02/26/17 0615  Gross per 24 hour  Intake              240 ml  Output              130 ml  Net               110 ml   Filed Weights   02/24/17 0526 02/25/17 0644 02/26/17 0404  Weight: 233 lb 8 oz (105.9 kg) 228 lb 6.4 oz (103.6 kg) 250 lb 6.4 oz (113.6 kg)    Physical Exam   GEN: Well nourished, well developed, in no acute distress. Morbidly obese HEENT: Grossly normal.  Neck: Supple, no JVD, carotid bruits, or masses. Cardiac: RRR, 2/6 soft murmur @RUSB , rubs, or gallops. No clubbing, cyanosis, edema.  Radials/DP/PT 2+ and equal bilaterally.  Respiratory:  Respirations regular and unlabored, clear to auscultation bilaterally. GI: Soft, nontender, nondistended, BS + x 4. MS: no deformity or atrophy. Skin: warm and dry, no rash. Left groin with receeding hematoma and bandage with wound vac in place.  Neuro:  Strength and sensation are intact. Psych: AAOx3.  Normal affect.  Labs    CBC  Recent Labs  02/25/17 0302 02/26/17 0331  WBC 7.4 8.8  NEUTROABS 5.5 6.7  HGB 8.0* 8.0*  HCT 23.9* 24.4*  MCV 88.5 88.7  PLT 218 993   Basic Metabolic Panel  Recent Labs  02/25/17 0302 02/26/17 0331  NA 134* 132*  K 4.0 4.0  CL 103 100*  CO2 26 25  GLUCOSE 84 100*  BUN 46* 39*  CREATININE 1.32* 1.18*  CALCIUM 9.0 9.1  MG 1.9 1.9  PHOS 3.7 3.6   Liver Function Tests  Recent Labs  02/25/17 0302 02/26/17 0331  ALBUMIN 1.9* 1.8*   No results for input(s): LIPASE, AMYLASE in the last 72 hours. Cardiac Enzymes No results for input(s): CKTOTAL, CKMB, CKMBINDEX, TROPONINI in the last 72 hours. BNP Invalid input(s): POCBNP D-Dimer No results for input(s): DDIMER in the last 72 hours. Hemoglobin A1C No results for input(s): HGBA1C in the last 72 hours. Fasting Lipid Panel No results for input(s): CHOL, HDL, LDLCALC, TRIG, CHOLHDL, LDLDIRECT in the last 72 hours. Thyroid Function Tests No results for input(s): TSH, T4TOTAL, T3FREE, THYROIDAB in the last 72 hours.  Invalid input(s): FREET3  Telemetry    NSR - Personally Reviewed  ECG    NSR, LBBB - Personally  Reviewed  Radiology    No results found.  Cardiac Studies   Postoperative echo: 02/06/2017 LV EF: 50% -55% Study Conclusions - Left ventricle: The cavity size was normal. There was mild   concentric hypertrophy. Systolic function was normal. The   estimated ejection fraction was in the range of 50% to 55%. Wall   motion was normal; there were no regional wall motion   abnormalities. Doppler parameters are consistent with abnormal   left ventricular relaxation (grade 1 diastolic dysfunction). - Ventricular septum: Septal motion showed abnormal function and   dyssynergy. - Aortic valve: Medtronic CoreValve Evolut Pro (size 3mm) was   functioning normally. Peak velocity (S): 156 cm/s. Mean gradient   (S): 5 mm Hg. Valve area (Vmax): 1.38 cm^2. - Mitral valve: Moderately calcified annulus. - Pericardium, extracardiac: There was a left pleural effusion.  CT abdomen 02/13/17 IMPRESSION: 1. Large hematoma involving the right lateral abdominal wall measuring approximately 10 x 16 cm. 2. Retroperitoneal hematoma involving the left lower pelvis measuring approximately 9 x 7 x 8 cm. 3. Surgical drain in the left lateral abdominal wall without evidence of hematoma. 4. 4 mm right middle lobe lung nodule as noted previously. Please see prior recommendations for follow-up. 5. Nonobstructing bilateral renal calculi. 6. Large colonic stool burden. Scattered colonic diverticula without evidence of acute diverticulitis. 7. Likely post radiation skin thickening and trabecular thickening involving the left breast  Patient Profile     81 y.o. female with history of chronic diastolic CHF, HTN, HLD, DM, gout, breast cancer, CAD, severe aortic stenosis and severe carotid artery disease who was admitted following TAVR on 02/05/17. Following the case she had an active bleed from her left groin that we were unable to control with manual hemostasis. She had a significant blood loss with shock and was  taken to the OR on 9/11 for femoral artery repair. Her Hg dropped again and she then went back to the OR on 9/14 for evacuation of hematoma and Blake drain placement with primary closure. She has had a prolonged hospital course due to groin wound.   Assessment & Plan    Severe AS: s/p successful TAVR with a 26 mm Medtronic CoreValve Evolut Pro via L TF approach on 02/05/17. Post operative echo 02/06/17 showed good valve placement with no PVL. ECG with new LBBB but no bradyarrhythmia. She has been resumed on her ASA/Plavix given DES x2 to her LAD and LCx 8/3 and TAVR  Acute on chronic diastolic CHF:  she had post op volume overload and was diuresed with IV lasix. She is now euvolemic. Continue home lasix 40mg  daily.   Acute blood loss anemia: Her Hg had stabilized after ~18 PRBCs transfusions and two trips to the OR. Hg dropped to 6.6 on 9/19 and she was transfused 2U PRBCs. Hg has remained stable since that time.   Left groin hematoma: Post op she had a large hematoma with active bleeding resulting in hypovolemic shock requiring pressors and resuscitation with 18 units of pRBCs, platelets, cryo, FFP and closure of the artery in the OR on 9/11 and hematoma evacuation on 9/14. CT abdomen 9/19 showed a large abdominal hematoma and retroperitoneal hematoma. She went back to the OR on 9/28 for I&D, placement of Abx beads and application of wound vac. VVS plans for repeat dressing change tomorrow to get her on a MWF schedule. Wound cx grew E.Coli and Proteus. Started on Ceftriaxone IV while she remains in the hospital. We will convert her to Augmentin once she is discharged. Plan for DC to SNF tomorrow.   Acute respiratory failure/shock: now extubated and off all pressors   AKI: creatinine back to baseline of 1.2-1.5   CAD: she underwent DES to mLAD, PCTA to oDiag, and DES to LCx on 12/28/16. Continue DAPT with ASA/plavix   Carotid artery disease: she will require R CEA after she recovers from  TAVR  Severe physical debility w/ limited mobility: She will need SNF at discharge. Plan for DC to [facility] tomorrow if bed available.   Signed, Angelena Form, PA-C  02/26/2017, 11:21 AM   I have personally seen and examined this patient. I agree with the assessment and plan as outlined above.  She is stable today. Wound management per VVS. Labs reviewed and stable. Afebrile. Wound culture with E coli and Proteus. She is on Ceftriaxone. Will convert to po prior to d/c to SNF tomorrow.   Lauree Chandler 02/26/2017 11:46 AM

## 2017-02-26 NOTE — Progress Notes (Addendum)
[facility]  Subjective  - No change, pleasnat.   Objective (!) 156/60 76 99.4 F (37.4 C) (Oral) 19 96%  Intake/Output Summary (Last 24 hours) at 02/26/17 0735 Last data filed at 02/26/17 0615  Gross per 24 hour  Intake              360 ml  Output              130 ml  Net              230 ml    Palpable DP Wound vac to suction 130 cc out put last 24 hours    Assessment/Planning:  81 y.o.femaleis s/p:  Left groin hematoma status post repair of left common femoral artery afterTAVRprocedure on 02/05/2017 and evacuation hematoma on 02/08/17 16Days Post-Op  POD # 2  Procedure:#!: I&D of left groin, including skin and subcutaneous tissue #2: Placement of antibiotic beads in to the retroperitoneum and left groin #3: Application of wound vac  Vac change planned today with WOC nurses per Dr. Stephens Shire plan I will try to coordinate to be present. Pending discharge and likely home with wound vac.  Laurence Slate [facility] 02/26/2017 7:35 AM --  Laboratory Lab Results:  Recent Labs  02/25/17 0302 02/26/17 0331  WBC 7.4 8.8  HGB 8.0* 8.0*  HCT 23.9* 24.4*  PLT 218 206   BMET  Recent Labs  02/25/17 0302 02/26/17 0331  NA 134* 132*  K 4.0 4.0  CL 103 100*  CO2 26 25  GLUCOSE 84 100*  BUN 46* 39*  CREATININE 1.32* 1.18*  CALCIUM 9.0 9.1    COAG Lab Results  Component Value Date   INR 1.15 02/22/2017   INR 1.12 02/08/2017   INR 1.38 02/05/2017   No results found for: PTT   Dressing changed with WOC this am.  Most of the tissue looks healthy, however there are small areas concerning for infection.  Recommend continuing wound vac and abx.  Will repeat dressing change tomorrow to get her on a MWF schedule. OK to d/c when bed available.  Wound cx grew E.Coli and Proteus.  Will start Ceftriaxone IV while she remains in the hospital.  She can be converted to Augmentin  once she is discharged.   Carrie Monroe

## 2017-02-26 NOTE — Progress Notes (Signed)
Clinical Social Worker met patient at bedside to offer support and give patient an update on SNF placement. Patient stated she is tired or being in the hospital and wants to go home. Patient stated she knows she cant go home because she doesn't have family to stay with her 24/hr. CSW asked patient if she would still like [facility] since they still have a bed available for her as of today. Patient stated she is agreeable to discharging to [facility]. CSW will discharge patient to SNF once medically stable.   Rhea Pink, MSW,  [place]

## 2017-02-27 ENCOUNTER — Other Ambulatory Visit: Payer: Self-pay

## 2017-02-27 ENCOUNTER — Encounter (HOSPITAL_COMMUNITY): Payer: Self-pay | Admitting: Physician Assistant

## 2017-02-27 DIAGNOSIS — Z952 Presence of prosthetic heart valve: Secondary | ICD-10-CM

## 2017-02-27 DIAGNOSIS — I350 Nonrheumatic aortic (valve) stenosis: Secondary | ICD-10-CM

## 2017-02-27 LAB — CBC WITH DIFFERENTIAL/PLATELET
Basophils Absolute: 0 10*3/uL (ref 0.0–0.1)
Basophils Relative: 0 %
EOS ABS: 0.1 10*3/uL (ref 0.0–0.7)
EOS PCT: 1 %
HCT: 21.2 % — ABNORMAL LOW (ref 36.0–46.0)
Hemoglobin: 7.2 g/dL — ABNORMAL LOW (ref 12.0–15.0)
LYMPHS ABS: 1 10*3/uL (ref 0.7–4.0)
LYMPHS PCT: 10 %
MCH: 30 pg (ref 26.0–34.0)
MCHC: 34 g/dL (ref 30.0–36.0)
MCV: 88.3 fL (ref 78.0–100.0)
MONO ABS: 0.9 10*3/uL (ref 0.1–1.0)
MONOS PCT: 9 %
Neutro Abs: 8.3 10*3/uL — ABNORMAL HIGH (ref 1.7–7.7)
Neutrophils Relative %: 80 %
PLATELETS: 201 10*3/uL (ref 150–400)
RBC: 2.4 MIL/uL — ABNORMAL LOW (ref 3.87–5.11)
RDW: 16.3 % — AB (ref 11.5–15.5)
WBC: 10.4 10*3/uL (ref 4.0–10.5)

## 2017-02-27 LAB — RENAL FUNCTION PANEL
Albumin: 1.9 g/dL — ABNORMAL LOW (ref 3.5–5.0)
Anion gap: 9 (ref 5–15)
BUN: 43 mg/dL — ABNORMAL HIGH (ref 6–20)
CO2: 24 mmol/L (ref 22–32)
Calcium: 9.2 mg/dL (ref 8.9–10.3)
Chloride: 101 mmol/L (ref 101–111)
Creatinine, Ser: 1.44 mg/dL — ABNORMAL HIGH (ref 0.44–1.00)
GFR calc Af Amer: 39 mL/min — ABNORMAL LOW (ref >60)
GFR calc non Af Amer: 34 mL/min — ABNORMAL LOW (ref >60)
Glucose, Bld: 106 mg/dL — ABNORMAL HIGH (ref 65–99)
Phosphorus: 4 mg/dL (ref 2.5–4.6)
Potassium: 3.9 mmol/L (ref 3.5–5.1)
Sodium: 134 mmol/L — ABNORMAL LOW (ref 135–145)

## 2017-02-27 LAB — GLUCOSE, CAPILLARY
GLUCOSE-CAPILLARY: 114 mg/dL — AB (ref 65–99)
Glucose-Capillary: 86 mg/dL (ref 65–99)
Glucose-Capillary: 129 mg/dL — ABNORMAL HIGH (ref 65–99)

## 2017-02-27 LAB — PREPARE RBC (CROSSMATCH)

## 2017-02-27 LAB — AEROBIC/ANAEROBIC CULTURE W GRAM STAIN (SURGICAL/DEEP WOUND)

## 2017-02-27 LAB — AEROBIC/ANAEROBIC CULTURE (SURGICAL/DEEP WOUND)

## 2017-02-27 LAB — MAGNESIUM: MAGNESIUM: 1.9 mg/dL (ref 1.7–2.4)

## 2017-02-27 MED ORDER — OXYCODONE-ACETAMINOPHEN 5-325 MG PO TABS: 1.0000 | ORAL_TABLET | ORAL | 0 refills | Status: DC | PRN

## 2017-02-27 MED ORDER — AMOXICILLIN-POT CLAVULANATE 875-125 MG PO TABS: 1.0000 | ORAL_TABLET | Freq: Two times a day (BID) | ORAL | 0 refills | Status: AC

## 2017-02-27 MED ORDER — SODIUM CHLORIDE 0.9 % IV SOLN: Freq: Once | INTRAVENOUS | Status: DC

## 2017-02-27 NOTE — Progress Notes (Signed)
Physical Therapy Treatment Patient Details Name: Carrie Monroe MRN: [id number] DOB: 1935/10/17 Today's Date: 02/27/2017    History of Present Illness 81 y.o. female with history of chronic diastolic CHF, HTN, HLD, DM, gout, breast cancer, CAD, severe aortic stenosis and severe carotid artery disease who was admitted following TAVR on 02/05/17. Following the case she had an active bleed from her left groin that we were unable to control with manual hemostasis. She had a significant blood loss with shock and was taken to the OR on 9/11 for femoral artery repair. Her Hg dropped again and she then went back to the OR on 9/14 for evacuation of hematoma and Blake drain placement with primary closure. Pt also now I&D of L groin wound with VAC placement on 9/28.    PT Comments    Pt is making progress towards her goals by improving strength and endurance. Pt currently, minA for bed mobility, transfers and ambulation of 150 feet with RW. Pt requires skilled PT to continue to progress mobility training and to improve strength and endurance for safe navigation in her discharge environment.   Follow Up Recommendations  SNF     Equipment Recommendations  Other (comment) (defer to next venue)    Recommendations for Other Services       Precautions / Restrictions Precautions Precautions: None Precaution Comments: wound VAC L groin Restrictions Weight Bearing Restrictions: No    Mobility  Bed Mobility Overal bed mobility: Needs Assistance Bed Mobility: Supine to Sit     Supine to sit: HOB elevated;Min assist     General bed mobility comments: minA for trunk to upright, pt with good technique to scoot hips to EoB  Transfers Overall transfer level: Needs assistance Equipment used: Rolling walker (2 wheeled) Transfers: Sit to/from Stand Sit to Stand: Min assist         General transfer comment: minA for power up, vc for hand placement   Ambulation/Gait Ambulation/Gait  assistance: Min assist Ambulation Distance (Feet): 150 Feet Assistive device: Rolling walker (2 wheeled) Gait Pattern/deviations: Step-through pattern;Decreased stride length;Decreased dorsiflexion - right;Decreased dorsiflexion - left;Trunk flexed;Decreased step length - right;Decreased step length - left Gait velocity: decreased Gait velocity interpretation: Below normal speed for age/gender General Gait Details: minA for steadying, pt able to ambulate for 150 feet with only one standing rest break, pt able to self correct her proximity to RW without vc          Balance Overall balance assessment: Needs assistance Sitting-balance support: Feet supported Sitting balance-Leahy Scale: Fair     Standing balance support: Bilateral upper extremity supported Standing balance-Leahy Scale: Fair Standing balance comment: reliant on bilateral UEs on RW                            Cognition Arousal/Alertness: Awake/alert Behavior During Therapy: WFL for tasks assessed/performed Overall Cognitive Status: Within Functional Limits for tasks assessed                                           General Comments General comments (skin integrity, edema, etc.): VSS      Pertinent Vitals/Pain Pain Assessment: Faces Faces Pain Scale: Hurts little more Pain Location: L groin with transitional movements Pain Descriptors / Indicators: Sore;Guarding;Grimacing Pain Intervention(s): Monitored during session;Premedicated before session;Limited activity within patient's tolerance  PT Goals (current goals can now be found in the care plan section) Acute Rehab PT Goals PT Goal Formulation: With patient Time For Goal Achievement: 02/25/17 Potential to Achieve Goals: Fair Progress towards PT goals: Progressing toward goals    Frequency    Min 3X/week      PT Plan Current plan remains appropriate       AM-PAC PT "6 Clicks" Daily Activity  Outcome  Measure  Difficulty turning over in bed (including adjusting bedclothes, sheets and blankets)?: A Lot Difficulty moving from lying on back to sitting on the side of the bed? : Unable Difficulty sitting down on and standing up from a chair with arms (e.g., wheelchair, bedside commode, etc,.)?: Unable Help needed moving to and from a bed to chair (including a wheelchair)?: A Little Help needed walking in hospital room?: A Lot Help needed climbing 3-5 steps with a railing? : A Lot 6 Click Score: 11    End of Session Equipment Utilized During Treatment: Gait belt Activity Tolerance: Patient limited by fatigue Patient left: in chair;with call bell/phone within reach Nurse Communication: Mobility status PT Visit Diagnosis: Unsteadiness on feet (R26.81);Other abnormalities of gait and mobility (R26.89);History of falling (Z91.81);Muscle weakness (generalized) (M62.81);Difficulty in walking, not elsewhere classified (R26.2) Pain - Right/Left: Left Pain - part of body:  (groin)     Time: 9147-8295 PT Time Calculation (min) (ACUTE ONLY): 18 min  Charges:  $Gait Training: 8-22 mins                    G Codes:       [name] PT, DPT Acute Rehabilitation  [phone] Pager [phone]     [place] 02/27/2017, 5:23 PM

## 2017-02-27 NOTE — Progress Notes (Signed)
CARDIAC REHAB PHASE I   PRE:  Rate/Rhythm: 78 SR  BP:  Sitting: 138/53        SaO2: 94 RA  MODE:  Ambulation: 200 ft   POST:  Rate/Rhythm: 111 ST  BP:  Sitting: 142/63         SaO2: 92 RA  Pt assisted to bathroom prior to ambulation. Pt ambulated 200 ft on RA, wound vac, rolling walker, gait belt, assist x2 (to follow with recliner), slow, mostly steady gait, tolerated well. Pt c/o fatigue with distance, seated rest x1, standing rest x1, improved activity tolerance today, able to increase distance with fewer rest breaks. Discussed IS, activity progression, and phase 2 cardiac rehab. Pt verbalized understanding, agrees to phase 2 cardiac rehab referral,will send to [facility]. Pt to recliner after walk, feet elevated, call bell within reach. Will follow if pt does not discharge today.   [phone] Lenna Sciara, RN, BSN 02/27/2017 9:06 AM

## 2017-02-27 NOTE — Progress Notes (Signed)
Dc intructions reviewed and given to patient and [facility] staff. Belongings sent with patient. Report given to [facility]

## 2017-02-27 NOTE — Progress Notes (Signed)
[place] VALVE TEAM   Patient Name: Carrie Monroe Date of Encounter: 02/27/2017  Primary Cardiologist: Dr. [facility] Problem List     Active Problems:   S/P TAVR (transcatheter aortic valve replacement)   Acute respiratory failure with hypoxia ([place])   Hemorrhagic shock (HCC)     Subjective   Excited to be leaving the hospital. Going to [facility]. Blood counts dropped from 8--> 7.2.   Inpatient Medications    Scheduled Meds: . aspirin EC  81 mg Oral Daily  . clopidogrel  75 mg Oral Q breakfast  . colchicine  0.6 mg Oral BID  . docusate sodium  200 mg Oral BID  . feeding supplement (PRO-STAT SUGAR FREE 64)  30 mL Oral BID  . ferrous [id number] C-folic acid  1 capsule Oral TID PC  . furosemide  40 mg Oral Daily  . Gerhardt's butt cream   Topical BID  . insulin aspart  0-24 Units Subcutaneous TID AC & HS  . insulin detemir  28 Units Subcutaneous BID  . metoprolol tartrate  25 mg Oral BID  . multivitamin with minerals  1 tablet Oral Daily  . rosuvastatin  5 mg Oral q1800  . sodium chloride flush  3 mL Intravenous Q12H   Continuous Infusions: . sodium chloride    . sodium chloride    . cefTRIAXone (ROCEPHIN)  IV Stopped (02/26/17 1339)  . lactated ringers     PRN Meds: docusate sodium, hydrocortisone, HYDROmorphone (DILAUDID) injection, Influenza vac split quadrivalent PF, lactated ringers, metoprolol tartrate, ondansetron (ZOFRAN) IV, oxyCODONE-acetaminophen, pneumococcal 23 valent vaccine, sodium chloride flush, sorbitol   Vital Signs    Vitals:   02/26/17 2005 02/26/17 2205 02/27/17 0000 02/27/17 0400  BP: (!) 152/65 (!) 141/54 (!) 130/50 (!) 137/47  Pulse: 82 75 65 62  Resp: 18 18 (!) 22 (!) 23  Temp: 98.2 F (36.8 C)   98.6 F (37 C)  TempSrc: Oral   Oral  SpO2: 95% 94% 95% 95%  Weight:    235 lb 3.2 oz (106.7 kg)  Height:        Intake/Output Summary (Last 24 hours) at  02/27/17 0931 Last data filed at 02/27/17 0813  Gross per 24 hour  Intake              290 ml  Output              200 ml  Net               90 ml   Filed Weights   02/25/17 0644 02/26/17 0404 02/27/17 0400  Weight: 228 lb 6.4 oz (103.6 kg) 250 lb 6.4 oz (113.6 kg) 235 lb 3.2 oz (106.7 kg)    Physical Exam   GEN: Well nourished, well developed, in no acute distress. Morbidly obese HEENT: Grossly normal.  Neck: Supple, no JVD, carotid bruits, or masses. Cardiac: RRR, 2/6 soft murmur @RUSB , rubs, or gallops. No clubbing, cyanosis, edema.  Radials/DP/PT 2+ and equal bilaterally.  Respiratory:  Respirations regular and unlabored, clear to auscultation bilaterally. GI: Soft, nontender, nondistended, BS + x 4. MS: no deformity or atrophy. Skin: warm and dry, no rash. Left groin with receeding hematoma and bandage with wound vac in place.  Neuro:  Strength and sensation are intact. Psych: AAOx3.  Normal affect.  Labs    CBC  Recent Labs  02/26/17 0331 02/27/17 0122  WBC 8.8 10.4  NEUTROABS  6.7 8.3*  HGB 8.0* 7.2*  HCT 24.4* 21.2*  MCV 88.7 88.3  PLT 206 660   Basic Metabolic Panel  Recent Labs  02/26/17 0331 02/27/17 0122  NA 132* 134*  K 4.0 3.9  CL 100* 101  CO2 25 24  GLUCOSE 100* 106*  BUN 39* 43*  CREATININE 1.18* 1.44*  CALCIUM 9.1 9.2  MG 1.9 1.9  PHOS 3.6 4.0   Liver Function Tests  Recent Labs  02/26/17 0331 02/27/17 0122  ALBUMIN 1.8* 1.9*   No results for input(s): LIPASE, AMYLASE in the last 72 hours. Cardiac Enzymes No results for input(s): CKTOTAL, CKMB, CKMBINDEX, TROPONINI in the last 72 hours. BNP Invalid input(s): POCBNP D-Dimer No results for input(s): DDIMER in the last 72 hours. Hemoglobin A1C No results for input(s): HGBA1C in the last 72 hours. Fasting Lipid Panel No results for input(s): CHOL, HDL, LDLCALC, TRIG, CHOLHDL, LDLDIRECT in the last 72 hours. Thyroid Function Tests No results for input(s): TSH, T4TOTAL, T3FREE,  THYROIDAB in the last 72 hours.  Invalid input(s): FREET3  Telemetry    NSR - Personally Reviewed  ECG    NSR, LBBB - Personally Reviewed  Radiology    No results found.  Cardiac Studies   Postoperative echo: 02/06/2017 LV EF: 50% -55% Study Conclusions - Left ventricle: The cavity size was normal. There was mild   concentric hypertrophy. Systolic function was normal. The   estimated ejection fraction was in the range of 50% to 55%. Wall   motion was normal; there were no regional wall motion   abnormalities. Doppler parameters are consistent with abnormal   left ventricular relaxation (grade 1 diastolic dysfunction). - Ventricular septum: Septal motion showed abnormal function and   dyssynergy. - Aortic valve: Medtronic CoreValve Evolut Pro (size 9mm) was   functioning normally. Peak velocity (S): 156 cm/s. Mean gradient   (S): 5 mm Hg. Valve area (Vmax): 1.38 cm^2. - Mitral valve: Moderately calcified annulus. - Pericardium, extracardiac: There was a left pleural effusion.  CT abdomen 02/13/17 IMPRESSION: 1. Large hematoma involving the right lateral abdominal wall measuring approximately 10 x 16 cm. 2. Retroperitoneal hematoma involving the left lower pelvis measuring approximately 9 x 7 x 8 cm. 3. Surgical drain in the left lateral abdominal wall without evidence of hematoma. 4. 4 mm right middle lobe lung nodule as noted previously. Please see prior recommendations for follow-up. 5. Nonobstructing bilateral renal calculi. 6. Large colonic stool burden. Scattered colonic diverticula without evidence of acute diverticulitis. 7. Likely post radiation skin thickening and trabecular thickening involving the left breast  Patient Profile     81 y.o. female with history of chronic diastolic CHF, HTN, HLD, DM, gout, breast cancer, CAD, severe aortic stenosis and severe carotid artery disease who was admitted following TAVR on 02/05/17. Following the case she had an  active bleed from her left groin that we were unable to control with manual hemostasis. She had a significant blood loss with shock and was taken to the OR on 9/11 for femoral artery repair. Her Hg dropped again and she then went back to the OR on 9/14 for evacuation of hematoma and Blake drain placement with primary closure. She has had a prolonged hospital course due to groin wound.   Assessment & Plan    Severe AS: s/p successful TAVR with a 26 mm Medtronic CoreValve Evolut Pro via L TF approach on 02/05/17. Post operative echo 02/06/17 showed good valve placement with no PVL. ECG with new LBBB  but no bradyarrhythmia. She has been stable on ASA/plavix.  Acute on chronic diastolic CHF: she had post op volume overload and was diuresed with IV lasix. She is now euvolemic. Continue home lasix 40mg  daily.   Acute blood loss anemia: Her Hg had stabilized after ~18 PRBCs transfusions and two trips to the OR. Hg dropped today from 8--> 7.2. Will give 1 unit of PRBCs today and follow CBCs closely as an outpatient.   Left groin hematoma: Post op she had a large hematoma with active bleeding resulting in hypovolemic shock requiring pressors and resuscitation with 18 units of pRBCs, platelets, cryo, FFP and closure of the artery in the OR on 9/11 and hematoma evacuation on 9/14. CT abdomen 9/19 showed a large abdominal hematoma and retroperitoneal hematoma. She went back to the OR on 9/28 for I&D, placement of Abx beads and application of wound vac. VVS plans for repeat dressing change today to get her on a MWF schedule. Wound cx grew E.Coli and Proteus. Started on Ceftriaxone IV while was in the hospital. I will convert her to Augmentin 875/125mg  BID x 10 days at discharge. She will also require pain medication for dressing changes which I will write for.   Acute respiratory failure/shock: now extubated and off all pressors   AKI: creatinine back to baseline of 1.2-1.5   CAD: she underwent DES to mLAD, PCTA  to oDiag, and DES to LCx on 12/28/16. Continue DAPT with ASA/plavix   Carotid artery disease: she will require R CEA after she recovers from TAVR  Severe physical debility w/ limited mobility: She will need SNF at discharge. Plan for DC to [facility] today   Signed, Angelena Form, PA-C  02/27/2017, 9:31 AM   I have personally seen and examined this patient. I agree with the assessment and plan as outlined above.  She is stable today. H/H drifted down slightly with no bleeding source. Will give one more unit pRBCs before d/c today. Pt has no dyspnea or chest pain. Discharge to SNF and close office f/u. Wound care plan and abx at discharge.   Lauree Chandler 02/27/2017 10:08 AM

## 2017-02-27 NOTE — Progress Notes (Signed)
Clinical Social Worker facilitated patient discharge including contacting patient family and facility to confirm patient discharge plans.  Clinical information faxed to facility and family agreeable with plan.  CSW arranged ambulance transport via [facility] to [facility] .  RN to call [phone] for report prior to discharge.  Clinical Social Worker will sign off for now as social work intervention is no longer needed. Please consult [place] again if new need arises.  Rhea Pink, MSW, Uncertain

## 2017-02-27 NOTE — Care Management Note (Signed)
Case Management Note Previous CM note initiated by [name], RN--02/11/2017, 10:25 AM    Patient Details  Name: Carrie Monroe MRN: [id number] Date of Birth: 1935-10-22  Subjective/Objective:     From home, presents with severe aortic stenosis, for  TAVR.    9/12 [place], BSN - in [place]  patient having hypotension and tachycardia, requiring pressors , hbg down, received transfusion (8 units prbc) 2 units FFP, chest pain and resp distress, left groin bleed , required intubation and exploratory evacuation of left retroperitoneal hematoma with wound vac.   9/14 [place], BSN - Evacuation of retroperitoneal hematoma and repair of femoral artery exploration, hs severe thrombocytopenia, wound vac removed, to return to OR for re-exploration.    [place], BSN - TAVR, Acute Resp failure, Hemorrhagic shock- evacuation of retroperitoneal hematoma, s/p re-exploration , JP drain to suction, per Cards note, she will need a R CEA after she recovers from TAVR.  Awaiting pt eval.   9/21 [place] RN, BSN -  Per pt eval rec SNF, CSW aware.                Action/Plan:  NCM will follow for dc needs.   Expected Discharge Date:  02/27/17               Expected Discharge Plan:  [place]  In-House Referral:  Clinical Social Work  Discharge planning Services  CM Consult  Post Acute Care Choice:  Durable Medical Equipment Choice offered to:  NA  DME Arranged:  Vac DME Agency:  [facility]  HH Arranged:  NA HH Agency:  NA  Status of Service:  Completed, signed off  If discussed at [employer] of Stay Meetings, dates discussed:  10/2  Discharge Disposition: skilled facility   Additional Comments:  02/27/17- 1030- Callee Rohrig RN, CM- pt for d/c to SNF today after blood tx- CSW following for placement needs- pt will need wound VAC at SNF- per [facility] they contract with [facility]- have made call to [place] with [facility] for  transport [facility]- portable equip. To bring portable VAC to room prior to discharge- for transport to [place] place-   Dawayne Patricia, RN 02/27/2017, 10:29 AM [phone]

## 2017-02-27 NOTE — Progress Notes (Signed)
[facility]  Subjective  - Doing better.   Objective (!) 137/47 62 98.6 F (37 C) (Oral) (!) 23 95%  Intake/Output Summary (Last 24 hours) at 02/27/17 0854 Last data filed at 02/27/17 0813  Gross per 24 hour  Intake              290 ml  Output              200 ml  Net               90 ml    Left groin wound vac in place to suction. Palpable DP 2+ left LE  Assessment/Planning:  81 y.o.femaleis s/p:  Left groin hematoma status post repair of left common femoral artery afterTAVRprocedure on 02/05/2017 and evacuation hematoma on 02/08/17 16Days Post-Op  POD # 2  Procedure:#!: I&D of left groin, including skin and subcutaneous tissue #2: Placement of antibiotic beads in to the retroperitoneum and left groin #3: Application of wound vac  Vac change planned today with WOC nurses per Dr. Stephens Shire plan I will try to coordinate to be present.  If she tolerates the wound vac change today without IV pain medication she can be discharged to SNF today.  Laurence Slate [facility] 02/27/2017 8:54 AM --  Laboratory Lab Results:  Recent Labs  02/26/17 0331 02/27/17 0122  WBC 8.8 10.4  HGB 8.0* 7.2*  HCT 24.4* 21.2*  PLT 206 201   BMET  Recent Labs  02/26/17 0331 02/27/17 0122  NA 132* 134*  K 4.0 3.9  CL 100* 101  CO2 25 24  GLUCOSE 100* 106*  BUN 39* 43*  CREATININE 1.18* 1.44*  CALCIUM 9.1 9.2    COAG Lab Results  Component Value Date   INR 1.15 02/22/2017   INR 1.12 02/08/2017   INR 1.38 02/05/2017   No results found for: PTT

## 2017-02-27 NOTE — Discharge Summary (Signed)
[facility]   Discharge Summary    Patient ID: Carrie Monroe,  MRN: [id number], DOB/AGE: Jan 19, 1936 81 y.o.  Admit date: 02/05/2017 Discharge date: 02/27/2017  Primary Care Provider: Marton Redwood Primary Cardiologist: Dr. Sallyanne Kuster Dr. Angelena Form   Discharge Diagnoses    Principal Problem:   S/P TAVR (transcatheter aortic valve replacement) Active Problems:   Breast cancer of upper-outer quadrant of left female breast ([place])   Dyslipidemia   Diabetes mellitus type 2 in obese ([place])   Chronic diastolic heart failure ([place])   Gout   Essential hypertension   Morbid obesity due to excess calories ([facility])   Chronic renal insufficiency, stage III (moderate) ([facility])   Carotid artery stenosis   Coronary artery disease   Acute respiratory failure with hypoxia ([facility])   Hemorrhagic shock ([facility])   Allergies No Known Allergies   History of Present Illness     81 y.o.femalewith history of chronic diastolic CHF, HTN, HLD, DM, gout, breast cancer s/p lumpectomy and radiation, CAD s/p recent multivessel PCI (12/2016), severe aortic stenosis and severe carotid artery disease who was admitted following TAVR on 02/05/17.   The patient has been known to have a heart murmur for several years and has been followed for the last few years by Dr. Sallyanne Kuster for aortic stenosis. Over the past 6-8 months patient has developed progressive exertional fatigue and shortness of breath without any associated symptoms of chest tightness or chest pressure. Follow-up echocardiogram performed 09/04/2016 revealed severe aortic stenosis with normal left ventricular systolic function. She was referred to the multidisciplinary valve clinic and underwent diagnostic cardiac catheterization by Dr. Angelena Form 12/17/2016 which confirmed the presence of severe aortic stenosis and was notable for severe multivessel coronary artery disease. She was seen in consultation by Dr. Cyndia Bent  and options were discussed including conventional surgical aortic valve replacement with coronary artery bypass grafting versus multivessel PCI and stenting with delayed transcatheter aortic valve replacement. The patient underwent successful PCI and stenting of the LAD and the RCA on 12/28/2016 by Dr.Jacklynn Dehaas and was ultimately referred for TAVR which was set up for 02/05/17.  Hospital Course     Consultants: none  Severe AS: s/p successful TAVR with a 78mm Medtronic CoreValve Evolut Pro via L TF approach on 02/05/17. Post operative echo 02/06/17 showed good valve placement with no PVL. ECG with new LBBB but no bradyarrhythmia. She has been stable on ASA/plavix.  Acute on chronic diastolic CHF: she had post op volume overload and was diuresed with IV lasix. She is now euvolemic. Continue home lasix 40mg  daily.   Acute blood loss anemia: Her Hg had stabilized after ~18 PRBCs transfusions and two trips to the OR. Hg dropped today from 8--> 7.2. Will give 1 unit of PRBCs today and follow CBCs closely as an outpatient.   Left groin hematoma: Post op she had a large hematoma with active bleeding resulting in hypovolemic shock requiring pressors and resuscitation with 18 units of pRBCs, platelets, cryo, FFP and closure of the artery in the OR on 9/11 and hematoma evacuation on 9/14. CT abdomen 9/19 showed a large abdominal hematoma and retroperitoneal hematoma. She went back to the OR on 9/28 for I&D, placement of Abx beads and application of wound vac. [facility]VS plans for repeat dressing change today to get her on a MWF schedule. Wound cx grew E.Coli and Proteus. Started on Ceftriaxone IV while was in the hospital. I will convert her to Augmentin 875/125mg  BID x  10 days at discharge. She will also require pain medication for dressing changes which I will write for.   Acute respiratory failure/shock: now extubated and off all pressors   AKI: creatinine back to baseline of 1.2-1.5   CAD: she underwent DES  to mLAD, PCTA to oDiag, and DES to LCx on 12/28/16. Continue DAPT with ASA/plavix   Carotid artery disease: she will require R CEA after she recovers from TAVR  Severe physical debility w/ limited mobility: plan for DC to [facility] place today    The patient has had a prolonged and complicated hospital course but is now recovering well. The femoral catheter sites are stable. She has been seen by Dr. Angelena Form today and deemed ready for discharge to a SNF. All follow-up appointments have been scheduled. Discharge medications are listed below.  _____________  Discharge Vitals Blood pressure (!) 137/47, pulse 62, temperature 98.6 F (37 C), temperature source Oral, resp. rate (!) 23, height 5\' 3"  (1.6 m), weight 235 lb 3.2 oz (106.7 kg), SpO2 95 %.  Filed Weights   02/25/17 0644 02/26/17 0404 02/27/17 0400  Weight: 228 lb 6.4 oz (103.6 kg) 250 lb 6.4 oz (113.6 kg) 235 lb 3.2 oz (106.7 kg)    Labs & Radiologic Studies     CBC  Recent Labs  02/26/17 0331 02/27/17 0122  WBC 8.8 10.4  NEUTROABS 6.7 8.3*  HGB 8.0* 7.2*  HCT 24.4* 21.2*  MCV 88.7 88.3  PLT 206 915   Basic Metabolic Panel  Recent Labs  02/26/17 0331 02/27/17 0122  NA 132* 134*  K 4.0 3.9  CL 100* 101  CO2 25 24  GLUCOSE 100* 106*  BUN 39* 43*  CREATININE 1.18* 1.44*  CALCIUM 9.1 9.2  MG 1.9 1.9  PHOS 3.6 4.0   Liver Function Tests  Recent Labs  02/26/17 0331 02/27/17 0122  ALBUMIN 1.8* 1.9*   No results for input(s): LIPASE, AMYLASE in the last 72 hours. Cardiac Enzymes No results for input(s): CKTOTAL, CKMB, CKMBINDEX, TROPONINI in the last 72 hours. BNP Invalid input(s): POCBNP D-Dimer No results for input(s): DDIMER in the last 72 hours. Hemoglobin A1C No results for input(s): HGBA1C in the last 72 hours. Fasting Lipid Panel No results for input(s): CHOL, HDL, LDLCALC, TRIG, CHOLHDL, LDLDIRECT in the last 72 hours. Thyroid Function Tests No results for input(s): TSH, T4TOTAL, T3FREE,  THYROIDAB in the last 72 hours.  Invalid input(s): FREET3  Ct Abdomen Pelvis Wo Contrast  Result Date: 02/13/2017 CLINICAL DATA:  Generalized abdominal pain. Acute anemia. Hematemesis. Postop day 7 transcatheter aortic valve replacement. Postop day 4 hematoma evacuation. Personal history of left upper outer quadrant breast cancer post lumpectomy in 2016. EXAM: CT ABDOMEN AND PELVIS WITHOUT CONTRAST TECHNIQUE: Multidetector CT imaging of the abdomen and pelvis was performed following the standard protocol without IV contrast. COMPARISON:  12/18/2016. FINDINGS: Lower chest: 4 mm nodule in the right middle lobe (series 5, image 9) as seen on the prior examination and on the CTA chest 01/10/2017. Small bilateral pleural effusions, a new finding. Scattered areas of hyperlucency in the lower lobes, unchanged. Lungs otherwise clear. Aortic valve prosthesis post TAVR. Dense mitral annular calcification. Right coronary and left circumflex coronary artery atherosclerosis. Very small, insignificant pericardial effusion. Hepatobiliary: Two calcified granulomas in the liver. No significant focal hepatic parenchymal abnormality, allowing for the unenhanced technique. Surgically absent gallbladder. No biliary ductal dilation. Pancreas: Normal unenhanced appearance. Spleen: Normal unenhanced appearance. Adrenals/Urinary Tract: Normal appearing adrenal glands. Benign cyst in the  upper pole of the right kidney measuring approximately 2 cm. Nonobstructing very small calculi involving a lower pole calyx of the right kidney and mid calices of the left kidney. No visible ureteral calculi on either side. Within the limits of the unenhanced technique, no significant focal parenchymal abnormality involving either kidney. Urinary bladder decompressed and unremarkable. Stomach/Bowel: Stomach decompressed and unremarkable. Normal-appearing small bowel. Large stool burden throughout the colon from cecum to rectum. Scattered diverticula  throughout the colon without evidence of acute diverticulitis. Very small decompressed appendix as noted previously without evidence acute appendicitis. Vascular/Lymphatic: Aortoiliofemoral atherosclerosis without evidence of aneurysm. No pathologic lymphadenopathy. Reproductive: Normal appearing uterus. No adnexal masses. Calcification within the right ovary as noted previously. Other: Large hematoma in the right lateral abdominal wall measuring approximately 10 x 16 cm (best seen on sagittal series 7, image 2). Surgical drain in the left lateral abdominal wall without evidence of hematoma. Retroperitoneal hematoma involving the left lower pelvis adjacent to the urinary bladder measuring approximately 9 x 7 x 8 cm. Likely post radiation skin thickening and trabecular thickening involving the left breast. Musculoskeletal: Osseous demineralization. DISH involving the lower thoracic spine. Multilevel degenerative disc disease and facet degenerative changes throughout the lumbar spine. Degenerative changes involving the symphysis pubis. Moderate to severe multifactorial spinal stenosis at L4-5. IMPRESSION: 1. Large hematoma involving the right lateral abdominal wall measuring approximately 10 x 16 cm. 2. Retroperitoneal hematoma involving the left lower pelvis measuring approximately 9 x 7 x 8 cm. 3. Surgical drain in the left lateral abdominal wall without evidence of hematoma. 4. 4 mm right middle lobe lung nodule as noted previously. Please see prior recommendations for follow-up. 5. Nonobstructing bilateral renal calculi. 6. Large colonic stool burden. Scattered colonic diverticula without evidence of acute diverticulitis. 7. Likely post radiation skin thickening and trabecular thickening involving the left breast. Electronically Signed   By: Evangeline Dakin M.D.   On: 02/13/2017 09:24   Dg Chest 2 View  Result Date: 02/01/2017 CLINICAL DATA:  Aortic stenosis, preoperative evaluation EXAM: CHEST  2 VIEW  COMPARISON:  01/14/2009, 01/10/2017 FINDINGS: Stable cardiomegaly with mild vascular congestion. Aorta is atherosclerotic. Mild hyperinflation without focal pneumonia, collapse or consolidation. Negative for edema, effusion or pneumothorax. Trachea is midline. Degenerative spondylosis of the spine. Stable exam. IMPRESSION: Stable chest exam.  No interval change or acute process Electronically Signed   By: Jerilynn Mages.  Shick M.D.   On: 02/01/2017 12:49   Dg Abd 1 View  Result Date: 02/05/2017 CLINICAL DATA:  Postop. Evaluate for foreign body. Evacuation of retroperitoneal hematoma. EXAM: ABDOMEN - 1 VIEW COMPARISON:  CT 01/10/2017 FINDINGS: Surgical clip noted lateral to the acetabulum. No additional radiopaque foreign body. Nonobstructive bowel gas pattern. Mild degenerative changes in the hips. No acute bony abnormality. IMPRESSION: No visible unexpected radiopaque foreign body. Electronically Signed   By: Rolm Baptise M.D.   On: 02/05/2017 15:10   Dg Chest Port 1 View  Result Date: 02/07/2017 CLINICAL DATA:  Status post transcatheter aortic valve replacement on February 05, 2017 EXAM: PORTABLE CHEST 1 VIEW COMPARISON:  Portable chest x-ray of February 06, 2017 FINDINGS: The lungs are well-expanded. Bibasilar densities persist. There is no pneumothorax or significant pleural effusion. There is subsegmental atelectasis in the left lower lung. The cardiac silhouette is top-normal in size. The pulmonary vascularity is less congested. There is calcification in the wall of the aortic arch. The right internal jugular venous catheter tip projects over the midportion of the SVC. IMPRESSION: Improving CHF. Bibasilar  subsegmental atelectasis. No significant pleural effusion. Thoracic aortic athero scleroses. Electronically Signed   By: David  Martinique M.D.   On: 02/07/2017 07:33   Dg Chest Port 1 View  Result Date: 02/06/2017 CLINICAL DATA:  Intubated patient, status post aortic valve replacement yesterday. EXAM: PORTABLE  CHEST 1 VIEW COMPARISON:  Portable chest x-ray of February 05, 2017 FINDINGS: The lungs are well-expanded. The interstitial markings are less conspicuous. The central pulmonary vascularity remains prominent. There is a trace of pleural fluid on the left. The cardiac silhouette is enlarged. The prosthetic aortic valve and aortic stent appear stable. The endotracheal tube tip projects 3.2 cm above the carina. The esophagogastric tube tip projects below the inferior margin of the image. The right internal jugular venous catheter tip projects over the midportion of the SVC. IMPRESSION: Decreased pulmonary interstitial edema and pulmonary vascular congestion. Tiny left pleural effusion. The support tubes are in stable position. Thoracic aortic atherosclerosis. Electronically Signed   By: David  Martinique M.D.   On: 02/06/2017 07:40   Dg Chest Port 1 View  Result Date: 02/05/2017 CLINICAL DATA:  Postop for TAVR. EXAM: PORTABLE CHEST 1 VIEW COMPARISON:  Not 2018, earlier the same day FINDINGS: 1551 hours. Leftward patient rotation. Endotracheal tube tip is 8 mm above the base of the carina and could be retracted 10-15 mm for more appropriate positioning. NG tube appears to be looped upon itself with the tip positioned in the distal esophagus and directed cranially. Left IJ sheath is evident. A right IJ central line tip projects over the expected location of the mid SVC. Cardiopericardial silhouette is enlarged. There is vascular congestion with bibasilar atelectasis. Bones are diffusely demineralized. Telemetry leads overlie the chest. IMPRESSION: 1. Endotracheal tube tip is 8 mm above the base of the carina. 2. NG tube tip is looped upon itself with the tip directed cranially and positioned in the distal esophagus. 3. Cardiomegaly with vascular congestion and bibasilar atelectasis. I discussed these findings by telephone with the patient's nurse, Altha Harm , at 1610 hours on 02/05/2017. Electronically Signed   By: Misty Stanley M.D.   On: 02/05/2017 16:11   Dg Chest Port 1 View  Result Date: 02/05/2017 CLINICAL DATA:  Status post TAVR. EXAM: PORTABLE CHEST 1 VIEW COMPARISON:  Chest x-ray dated February 01, 2017. FINDINGS: Interval TAVR. Right internal jugular central venous catheter with the tip projecting over the distal SVC. Stable cardiomegaly. Mild pulmonary vascular congestion. Atherosclerotic calcification of the aortic arch. Bibasilar atelectasis. No pneumothorax or pleural effusion. No acute osseous abnormality. IMPRESSION: Interval TAVR. Mild pulmonary vascular congestion with bibasilar atelectasis. Electronically Signed   By: Titus Dubin M.D.   On: 02/05/2017 10:56   Dg Abd Portable 1v  Result Date: 02/05/2017 CLINICAL DATA:  Encounter for orogastric tube placement EXAM: PORTABLE ABDOMEN - 1 VIEW COMPARISON:  02/05/2017 at 1444 hours FINDINGS: Targeted study of the left hemiabdomen from orogastric tube position. The tip and side port of a gastric tube are identified in the left upper quadrant of the abdomen in the expected location of the proximal stomach. A vascular stent projects over the included heart. Blunting the left costophrenic angle is seen. Surgical clips project over the left breast shadow. IMPRESSION: Gastric tube tip and side-port are in the expected location the stomach. Electronically Signed   By: Ashley Royalty M.D.   On: 02/05/2017 20:12     Diagnostic Studies/Procedures    Postoperative echo: 02/06/2017 LV EF: 50% -55% Study Conclusions - Left ventricle: The cavity  size was normal. There was mild concentric hypertrophy. Systolic function was normal. The estimated ejection fraction was in the range of 50% to 55%. Wall motion was normal; there were no regional wall motion abnormalities. Doppler parameters are consistent with abnormal left ventricular relaxation (grade 1 diastolic dysfunction). - Ventricular septum: Septal motion showed abnormal function  and dyssynergy. - Aortic valve: Medtronic CoreValve Evolut Pro (size 65mm) was functioning normally. Peak velocity (S): 156 cm/s. Mean gradient (S): 5 mm Hg. Valve area (Vmax): 1.38 cm^2. - Mitral valve: Moderately calcified annulus. - Pericardium, extracardiac: There was a left pleural effusion.  CT abdomen 02/13/17 IMPRESSION: 1. Large hematoma involving the right lateral abdominal wall measuring approximately 10 x 16 cm. 2. Retroperitoneal hematoma involving the left lower pelvis measuring approximately 9 x 7 x 8 cm. 3. Surgical drain in the left lateral abdominal wall without evidence of hematoma. 4. 4 mm right middle lobe lung nodule as noted previously. Please see prior recommendations for follow-up. 5. Nonobstructing bilateral renal calculi. 6. Large colonic stool burden. Scattered colonic diverticula without evidence of acute diverticulitis. 7. Likely post radiation skin thickening and trabecular thickening involving the left breast   Disposition   Pt is being discharged to a SNF today in good condition.  Follow-up Plans & Appointments     Contact information for follow-up providers    Serafina Mitchell, MD Follow up in 3 week(s).   Specialties:  Vascular Surgery, Cardiology Why:  office will call Contact information: [place] [address] [phone]        Eileen Stanford, PA-C. Go on 03/06/2017.   Specialties:  Cardiology, Radiology Why:  @ 3pm for your echocardiogram and follow up with Nell Range @ 4pm.  Contact information: [place] [id number] [phone]            Contact information for after-discharge care    Destination    [facility] Follow up.   Specialty:  [place] information: [place] [phone]                 Discharge Instructions    Amb Referral to Cardiac Rehabilitation    Complete by:   As directed    Diagnosis:  Valve Replacement   Valve:  Aortic Comment - TAVR      Discharge Medications     Medication List    TAKE these medications   allopurinol 300 MG tablet Commonly known as:  ZYLOPRIM Take 300 mg by mouth daily as needed (gout).   amoxicillin-clavulanate 875-125 MG tablet Commonly known as:  AUGMENTIN Take 1 tablet by mouth 2 (two) times daily.   aspirin 81 MG chewable tablet Chew 1 tablet (81 mg total) by mouth daily.   clopidogrel 75 MG tablet Commonly known as:  PLAVIX Take 1 tablet (75 mg total) by mouth daily with breakfast.   colchicine 0.6 MG tablet Take 0.6 mg by mouth every 8 (eight) hours as needed (gout flare).   furosemide 40 MG tablet Commonly known as:  LASIX Take 1 tablet (40 mg total) by mouth daily. Take half tablet by mouth daily as needed for swelling What changed:  when to take this  reasons to take this  additional instructions   hydrALAZINE 50 MG tablet Commonly known as:  APRESOLINE Take 1 tablet (50 mg total) by mouth every 8 (eight) hours. What changed:  when to take this   metoprolol tartrate  25 MG tablet Commonly known as:  LOPRESSOR Take 1 tablet (25 mg total) by mouth 2 (two) times daily.   oxyCODONE-acetaminophen 5-325 MG tablet Commonly known as:  PERCOCET/ROXICET Take 1-2 tablets by mouth every 4 (four) hours as needed for moderate pain.   rosuvastatin 10 MG tablet Commonly known as:  CRESTOR Take 10 mg by mouth daily.   tamoxifen 20 MG tablet Commonly known as:  NOLVADEX Take 1 tablet by mouth every morning.   VITAMIN D PO Take 1 capsule by mouth daily.       Outstanding Labs/Studies   CBC, BMET  Duration of Discharge Encounter   Greater than 30 minutes including physician time.  Signed, Angelena Form PA-C 02/27/2017, 9:59 AM  See the progress note from today with all details of visit today.   Lauree Chandler 02/27/2017 10:11 AM

## 2017-02-27 NOTE — Discharge Instructions (Signed)
ACTIVITY AND EXERCISE °• Daily activity and exercise are an important part of your recovery. People recover at different rates depending on their general health and type of valve procedure. °• Most people recovering from TAVR feel better relatively quickly  °• No lifting, pushing, pulling more than 10 pounds (examples to avoid: groceries, vacuuming, gardening, golfing): °            - For one week with a procedure through the groin. °            - For six weeks for procedures through the chest wall. °            - For three months for procedures through the breast-bone. °NOTE: You will typically see one of our providers 7-10 days after your procedure to discuss WHEN TO RESUME the above activities.  °  °  °DRIVING °• Do not drive for until you are seen for follow up and cleared by a provider. °• If you have been told by your doctor in the past that you may not drive, you must talk with him/her before you begin driving again. °  °  °DRESSING °• Groin site: you may leave the clear dressing over the site for up to one week or until it falls off. °  °  °HYGIENE °• If you had a femoral (leg) procedure, you may take a shower when you return home. After the shower, pat the site dry. Do NOT use powder, oils or lotions in your groin area until the site has completely healed. °• If you had a chest procedure, you may shower when you return home unless specifically instructed not to by your discharging practitioner. °            - DO NOT scrub incision; pat dry with a towel °            - DO NOT apply any lotions, oils, powders to the incision °            - No tub baths / swimming for at least 2 weeks. °• If you notice any fevers, chills, increased pain, swelling, bleeding or pus, please contact your doctor. °  °ADDITIONAL INFORMATION °• If you are going to have an upcoming dental procedure, please contact our office as you will require antibiotics ahead of time to prevent infection on your heart valve.  ° ° °

## 2017-02-28 ENCOUNTER — Telehealth (HOSPITAL_COMMUNITY): Payer: Self-pay

## 2017-02-28 ENCOUNTER — Telehealth: Payer: Self-pay | Admitting: Physician Assistant

## 2017-02-28 ENCOUNTER — Telehealth: Payer: Self-pay | Admitting: Surgery

## 2017-02-28 LAB — BPAM RBC
Blood Product Expiration Date: 201810092359
ISSUE DATE / TIME: 201810031153
Unit Type and Rh: 600

## 2017-02-28 LAB — TYPE AND SCREEN
ABO/RH(D): A POS
Antibody Screen: NEGATIVE
UNIT DIVISION: 0

## 2017-02-28 NOTE — Telephone Encounter (Signed)
-----   Message from Mena Goes, RN sent at 02/27/2017  9:22 AM EDT ----- Regarding: 3 weeks   ----- Message ----- From: Ulyses Amor, PA-C Sent: 02/27/2017   9:00 AM To: [facility] Charge Pool  S/P left groin repair of femoral artery and I & D of groin.  Discharged with wound vac.  F/U with Dr. Trula Slade in 3 weeks for left groin wound check.

## 2017-02-28 NOTE — Telephone Encounter (Signed)
Sched appt 03/25/17 at 1:30. Lm on cell#.

## 2017-02-28 NOTE — Telephone Encounter (Signed)
    Patient contacted regarding discharge from hospital on 02/27/17.  Patient understands to follow up with Angelena Form on 03/06/17 at 3pm at [facility]  Patient understands discharge instructions? yes Patient understands medications and regiment? yes Patient understands to bring all medications to this visit? Yes   Patient frustrated because the wound vac broke at [facility] and they are ordering a new one.    Angelena Form PA-C  [facility]

## 2017-02-28 NOTE — Telephone Encounter (Signed)
Verified insurance. No co-payment, no deductible, out of pocket amount is $5,500/$2,288.64 has been met, no co-insurance, and no pre-authorization is required. Passport/reference # [id number]

## 2017-03-05 NOTE — Progress Notes (Signed)
Cardiology Office Note    Date:  03/07/2017   ID:  Carrie Monroe, DOB 02-06-1936, MRN [id number]  PCP:  Marton Redwood, MD  Cardiologist:  Dr. Sallyanne Kuster Dr. Angelena Form  CC: 1 month follow up   History of Present Illness:  Carrie Monroe is a 81 y.o. female with a history of chronic diastolic CHF, HTN, HLD, DM, gout, breast cancer s/p lumpectomy and radiation, CAD s/p recent multivessel PCI (12/2016), severe carotid artery disease and severe aortic stenosis s/p TAVR (02/05/17) who presents to clinic for follow up.    The patient has been known to have a heart murmur for several years and has been followed for the last few years by Dr. Sallyanne Kuster for aortic stenosis. Over the past 6-8 months patient has developed progressive exertional fatigue and shortness of breath. Follow-up echocardiogram performed 09/04/2016 revealed severe aortic stenosis with normal left ventricular systolic function. She was referred to the multidisciplinary valve clinic and underwent diagnostic cardiac catheterization by Dr. Angelena Form 12/17/2016 which confirmed the presence of severe aortic stenosis and was notable for severe multivessel coronary artery disease. The patient underwent successful PCI and stenting of the LAD and the RCA on 12/28/2016 by Dr.McAlhany and was ultimately referred for TAVR which was set up for 02/05/17.  She underwent successful TAVR with a 60mm Medtronic CoreValve Evolut Pro via L TF approach on 02/05/17. Post operative echo 02/06/17 showed good valve placement with no PVL. ECG with new LBBB but no bradyarrhythmia. Her hospital course was prolonged by complications at her groin site. Post op she had a large hematoma with active bleeding resulting in hypovolemic shock requiring pressors and resuscitation with 18 units of pRBCs, platelets, cryo, FFP and closure of the artery in the OR on 9/11 and hematoma evacuation on 9/14. CT abdomen 9/19 showed a large abdominal hematoma and retroperitoneal  hematoma. She went back to the OR on 9/28 for I&D, placement of Abx beads and application of wound vac. Wound culture grew out proteus and e coli and she was placed on Rocephin which was switched to Augentin at discharge. She was discharged to a SNF with a wound vac. On the day of discharge her Hg was noted to drop and she was given an additional unit of PRBCs.   Today she presents to clinic for follow up. She is doing okay. She feels like she is getting stronger with PT. Still has wound vac in place. Groin feeling a lot better. No chest pain. Shortness of breath has improved but mobility has been limited given deconditioning and groin hematoma. No LE edema, orthopnea or PND. No dizziness or syncope. No blood in her stool or urine. No palpitations.    Past Medical History:  Diagnosis Date  . Aortic stenosis, severe    a. 01/2017: s/p TAVR; hospital course complicated by large groin hematoma/wound  . Arthritis   . Breast cancer (Uncertain)    a. L breast cancer s/p lumpectomy and XRT  . Carotid artery stenosis    a. 11/2016: 80-99% RICA stenosis, 1-39% vs low range 90-30% LICA   . Chronic diastolic CHF (congestive heart failure) ([place])   . CKD (chronic kidney disease)   . Coronary artery disease    a. 11/2016: diagnosed with multivessel CAD, turned down for CABG and underwent PCI/DES to mLCx, PCI/DES to mLAD and PCTA of ostial diagonal on 12/28/16  . Diabetes mellitus    a. diet controlled   . Exogenous obesity   . Gout   .  Hemorrhoids   . History of kidney stones   . Hyperlipidemia   . Hypertension   . 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 APPLICATION OF WOUND VAC Left 02/05/2017   Procedure: APPLICATION OF WOUND VAC;  Surgeon: Serafina Mitchell, MD;  Location: [place];  Service: Vascular;  Laterality: Left;    . APPLICATION OF WOUND VAC Left 02/22/2017   Procedure: APPLICATION OF WOUND VAC LEFT GROIN;  Surgeon: Serafina Mitchell, MD;  Location: [place];  Service: Vascular;  Laterality: Left;  . BREAST LUMPECTOMY  2009   left  . BREAST LUMPECTOMY WITH NEEDLE LOCALIZATION Left 01/06/2015   Procedure: BREAST BIOPSY WITH NEEDLE LOCALIZATION AND SKIN BIOPSY;  Surgeon: Autumn Messing III, MD;  Location: [place];  Service: General;  Laterality: Left;  . CATARACT EXTRACTION    . CHOLECYSTECTOMY  2010  . CORONARY STENT INTERVENTION N/A 12/28/2016   Procedure: Coronary Stent Intervention;  Surgeon: Burnell Blanks, MD;  Location: [place] [facility];  Service: Cardiovascular;  Laterality: N/A;  . EYE SURGERY     BILATERAL CATARACT EXTRACTIONS AND LENS IMPLANTS  . FEMORAL ARTERY EXPLORATION N/A 02/05/2017   Procedure: Evacuation of Retroperitoneal Hematoma and Primary Repair of Femoral Artery and FEMORAL ARTERY EXPLORATION;  Surgeon: Serafina Mitchell, MD;  Location: [facility];  Service: Vascular;  Laterality: N/A;  . HEMATOMA EVACUATION Left 02/08/2017   Procedure: EVACUATION HEMATOMA;  Surgeon: Rosetta Posner, MD;  Location: [place];  Service: Vascular;  Laterality: Left;  . I&D EXTREMITY Left 02/22/2017   Procedure: [place];  Surgeon: Serafina Mitchell, MD;  Location: [place];  Service: Vascular;  Laterality: Left;  . KNEE SURGERY     right PARTIAL KNEE REPLACEMENT  . LEFT HEART CATH AND CORONARY ANGIOGRAPHY N/A 12/17/2016   Procedure: Left Heart Cath and Coronary Angiography;  Surgeon: Burnell Blanks, MD;  Location: [place] [facility];  Service: Cardiovascular;  Laterality: N/A;  . MULTIPLE EXTRACTIONS WITH ALVEOLOPLASTY N/A 12/21/2016   Procedure: Extraction of tooth #'s 12, 23,24,25,26 and 29 with alveoloplasty and gross debridement of remaining teeth.;  Surgeon: Lenn Cal, DDS;  Location: [place];  Service: Oral Surgery;  Laterality: N/A;  . SHOULDER SURGERY      right  . SKIN GRAFT TO RIGHT HAND     AFTER BURN TO THE HAND  . TEE WITHOUT CARDIOVERSION N/A 02/05/2017   Procedure: TRANSESOPHAGEAL ECHOCARDIOGRAM (TEE);  Surgeon: Burnell Blanks, MD;  Location: [place];  Service: Open Heart Surgery;  Laterality: N/A;  . TOTAL KNEE ARTHROPLASTY Left 08/03/2013   Procedure: TOTAL LEFT KNEE ARTHROPLASTY;  Surgeon: Gearlean Alf, MD;  Location: [facility];  Service: Orthopedics;  Laterality: Left;  . TRANSCATHETER AORTIC VALVE REPLACEMENT, TRANSFEMORAL N/A 02/05/2017   Procedure: TRANSCATHETER AORTIC VALVE REPLACEMENT, TRANSFEMORAL;  Surgeon: Burnell Blanks, MD;  Location: [place];  Service: Open Heart Surgery;  Laterality: N/A;  . US ECHOCARDIOGRAPHY  05/15/2010   EF 60-65%    Current Medications: Outpatient Medications Prior to Visit  Medication Sig Dispense Refill  . allopurinol (ZYLOPRIM) 300 MG tablet Take 300 mg by mouth daily as needed (gout).    Marland Kitchen amoxicillin-clavulanate (AUGMENTIN) 875-125 MG tablet Take 1 tablet by mouth 2 (two) times daily. 20 tablet 0  . aspirin  81 MG chewable tablet Chew 1 tablet (81 mg total) by mouth daily.    . Cholecalciferol (VITAMIN D PO) Take 1 capsule by mouth daily.    . clopidogrel (PLAVIX) 75 MG tablet Take 1 tablet (75 mg total) by mouth daily with breakfast. 30 tablet 6  . colchicine 0.6 MG tablet Take 0.6 mg by mouth every 8 (eight) hours as needed (gout flare).    . metoprolol tartrate (LOPRESSOR) 25 MG tablet Take 1 tablet (25 mg total) by mouth 2 (two) times daily. 60 tablet 5  . oxyCODONE-acetaminophen (PERCOCET/ROXICET) 5-325 MG tablet Take 1-2 tablets by mouth every 4 (four) hours as needed for moderate pain. 30 tablet 0  . rosuvastatin (CRESTOR) 10 MG tablet Take 10 mg by mouth daily.   0  . tamoxifen (NOLVADEX) 20 MG tablet Take 1 tablet by mouth every morning. 90 tablet 3  . furosemide (LASIX) 40 MG tablet Take 1 tablet (40 mg total) by mouth daily. Take half tablet by mouth daily as needed  for swelling (Patient not taking: Reported on 03/06/2017) 30 tablet 3  . hydrALAZINE (APRESOLINE) 50 MG tablet Take 1 tablet (50 mg total) by mouth every 8 (eight) hours. (Patient not taking: Reported on 03/06/2017) 90 tablet 3   No facility-administered medications prior to visit.      Allergies:   Patient has no known allergies.   Social History   Social History  . Marital status: Married    Spouse name: N/A  . Number of children: 4  . Years of education: N/A   Occupational History  . 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 Smoking status: Never Smoker  . Smokeless tobacco: Never Used  . Alcohol use No  . Drug use: No  . Sexual activity: Not Currently   Other Topics Concern  . None   Social History Narrative   Epworth Sleepiness scale score =11 as of 01/25/16     Family History:  [name] family history includes Cancer in her brother and mother; Diabetes in her sister; Heart disease in her father; Hypertension in her father.     ROS:   Please see the history of present illness.    ROS All other systems reviewed and are negative.   PHYSICAL EXAM:   VS:  BP 134/60   Pulse 79   Ht 5\' 3"  (1.6 m)   Wt 236 lb (107 kg)   BMI 41.81 kg/m    GEN: Well nourished, well developed, in no acute distress, obese in wheelchair  HEENT: normal  Neck: no JVD, carotid bruits, or masses Cardiac: RRR; 2/6 soft murmur @RUSB , rubs, or gallops,no edema  Respiratory:  clear to auscultation bilaterally, normal work of breathing GI: soft, nontender, nondistended, + BS  MS: no deformity or atrophy  Skin: warm and dry, no rash. Left wound vac in place. Groin site with clearing hematoma  Neuro:  Alert and Oriented x 3, Strength and sensation are intact Psych: euthymic mood, full affect   Wt Readings from Last 3 Encounters:  03/06/17 236 lb (107 kg)  02/27/17 235 lb 3.2 oz (106.7 kg)  02/01/17 246 lb 1.6 oz (111.6 kg)      Studies/Labs Reviewed:    EKG:  EKG is ordered today.  The ekg ordered today demonstrates NSR, HR 79 LBBB  Recent Labs: 02/12/2017: ALT 21 02/27/2017: Magnesium 1.9 03/06/2017: BUN 29; Creatinine, Ser 1.36; Hemoglobin 9.4; Platelets 294; Potassium 3.8; Sodium 136   Lipid  Panel No results found for: CHOL, TRIG, HDL, CHOLHDL, VLDL, LDLCALC, LDLDIRECT  Additional studies/ records that were reviewed today include:  Postoperative echo: 02/06/2017 LV EF: 50% -55% Study Conclusions - Left ventricle: The cavity size was normal. There was mild concentric hypertrophy. Systolic function was normal. The estimated ejection fraction was in the range of 50% to 55%. Wall motion was normal; there were no regional wall motion abnormalities. Doppler parameters are consistent with abnormal left ventricular relaxation (grade 1 diastolic dysfunction). - Ventricular septum: Septal motion showed abnormal function and dyssynergy. - Aortic valve: Medtronic CoreValve Evolut Pro (size 28mm) was functioning normally. Peak velocity (S): 156 cm/s. Mean gradient (S): 5 mm Hg. Valve area (Vmax): 1.38 cm^2. - Mitral valve: Moderately calcified annulus. - Pericardium, extracardiac: There was a left pleural effusion.  CT abdomen 02/13/17 IMPRESSION: 1. Large hematoma involving the right lateral abdominal wall measuring approximately 10 x 16 cm. 2. Retroperitoneal hematoma involving the left lower pelvis measuring approximately 9 x 7 x 8 cm. 3. Surgical drain in the left lateral abdominal wall without evidence of hematoma. 4. 4 mm right middle lobe lung nodule as noted previously. Please see prior recommendations for follow-up. 5. Nonobstructing bilateral renal calculi. 6. Large colonic stool burden. Scattered colonic diverticula without evidence of acute diverticulitis. 7. Likely post radiation skin thickening and trabecular thickening involving the left breast   2D ECHO 03/06/17 Study Conclusions - Left  ventricle: The cavity size was normal. Wall thickness was   normal. Systolic function was normal. The estimated ejection   fraction was in the range of 50% to 55%. Wall motion was normal;   there were no regional wall motion abnormalities. Doppler   parameters are consistent with abnormal left ventricular   relaxation (grade 1 diastolic dysfunction). - Ventricular septum: Septal motion showed abnormal function,   dyssynergy, and paradox. These changes are consistent with   intraventricular conduction delay. - Aortic valve: A stent-valve (TAVR) bioprosthesis was present and   functioning normally. - Mitral valve: Calcified annulus. There was mild regurgitation. - Left atrium: The atrium was mildly dilated.   ASSESSMENT & PLAN:   Severe AS s/p TAVR: she has NYHA class II symptoms. 2D ECHO today showed a normally functioning TAVR with a mean gradient of 3 and peak gradient of 6. SBE prophylaxis discussed. She will require a year of DAPT with ASA and plavix given multi vessel stenting in 12/2016.  Chronic diastolic CHF: she appears euvolemic. Continue lasix 40mg  daily. BMET today  Anemia: check CBC today  Left groin hematoma: still has wound vac in place. She sees Dr. Trula Slade on 03/25/17  CKD: creatinine baseline of 1.2-1.5. Check BMET today   CAD: she underwent DES to mLAD, PCTA to oDiag, and DES to LCx on 12/28/16. Continue ASA/plavix.   Carotid artery disease: during her TAVR work up she was found to have an 80-99% RICA stenosis. Dr. Oneida Alar did a consult in the hospital on 7/25 and said she would require elective CEA after TAVR. I discussed case with Dr. Trula Slade who has managed her groin hematoma and he will address her carotid disease at their follow up appointment on 10/29   Medication Adjustments/Labs and Tests Ordered: Current medicines are reviewed at length with the patient today.  Concerns regarding medicines are outlined above.  Medication changes, Labs and Tests ordered today  are listed in the Patient Instructions below. Patient Instructions  Medication Instructions:  Your provider recommends that you continue on your current medications as directed. Please refer  to the Current Medication list given to you today.    Labwork: TODAY: CBC, BMET  Testing/Procedures: Your provider has requested that you have an echocardiogram in 1 year. Echocardiography is a painless test that uses sound waves to create images of your heart. It provides your doctor with information about the size and shape of your heart and how well your heart's chambers and valves are working. This procedure takes approximately one hour. There are no restrictions for this procedure.  Follow-Up: Your provider recommends that you schedule a follow-up appointment in 3 MONTHS with Dr. Sallyanne Kuster.  Your provider wants you to follow-up in: 1 year with K. Grandville Silos, [place]. You will receive a reminder letter in the mail two months in advance. If you don't receive a letter, please call our office to schedule the follow-up appointment.    Any Other Special Instructions Will Be Listed Below (If Applicable).     If you need a refill on your cardiac medications before your next appointment, please call your pharmacy.      Signed, Angelena Form, PA-C  03/07/2017 11:56 AM    [facility] [address], [place], [place]  [address] Phone: [phone]; Fax: [phone]

## 2017-03-06 ENCOUNTER — Encounter: Payer: Self-pay | Admitting: Physician Assistant

## 2017-03-06 ENCOUNTER — Other Ambulatory Visit: Payer: Self-pay

## 2017-03-06 ENCOUNTER — Ambulatory Visit (HOSPITAL_COMMUNITY): Payer: Medicare Other | Attending: Cardiovascular Disease

## 2017-03-06 ENCOUNTER — Ambulatory Visit (INDEPENDENT_AMBULATORY_CARE_PROVIDER_SITE_OTHER): Payer: Medicare Other | Admitting: Physician Assistant

## 2017-03-06 VITALS — BP 134/60 | HR 79 | Ht 63.0 in | Wt 236.0 lb

## 2017-03-06 DIAGNOSIS — I6523 Occlusion and stenosis of bilateral carotid arteries: Secondary | ICD-10-CM | POA: Diagnosis not present

## 2017-03-06 DIAGNOSIS — S301XXS Contusion of abdominal wall, sequela: Secondary | ICD-10-CM

## 2017-03-06 DIAGNOSIS — Z952 Presence of prosthetic heart valve: Secondary | ICD-10-CM

## 2017-03-06 DIAGNOSIS — D649 Anemia, unspecified: Secondary | ICD-10-CM | POA: Diagnosis not present

## 2017-03-06 DIAGNOSIS — I2510 Atherosclerotic heart disease of native coronary artery without angina pectoris: Secondary | ICD-10-CM | POA: Diagnosis not present

## 2017-03-06 DIAGNOSIS — I051 Rheumatic mitral insufficiency: Secondary | ICD-10-CM | POA: Diagnosis not present

## 2017-03-06 DIAGNOSIS — I5032 Chronic diastolic (congestive) heart failure: Secondary | ICD-10-CM | POA: Diagnosis not present

## 2017-03-06 DIAGNOSIS — N189 Chronic kidney disease, unspecified: Secondary | ICD-10-CM

## 2017-03-06 DIAGNOSIS — I350 Nonrheumatic aortic (valve) stenosis: Secondary | ICD-10-CM | POA: Diagnosis not present

## 2017-03-06 DIAGNOSIS — I420 Dilated cardiomyopathy: Secondary | ICD-10-CM | POA: Diagnosis not present

## 2017-03-06 DIAGNOSIS — I5030 Unspecified diastolic (congestive) heart failure: Secondary | ICD-10-CM | POA: Insufficient documentation

## 2017-03-06 MED ORDER — PERFLUTREN LIPID MICROSPHERE
1.0000 mL | INTRAVENOUS | Status: AC | PRN
Start: 2017-03-06 — End: 2017-03-06

## 2017-03-06 NOTE — Progress Notes (Unsigned)
The patient decided to refuse the use of Definity after a failed IV access attempt.

## 2017-03-06 NOTE — Patient Instructions (Addendum)
Medication Instructions:  Your provider recommends that you continue on your current medications as directed. Please refer to the Current Medication list given to you today.    Labwork: TODAY: CBC, BMET  Testing/Procedures: Your provider has requested that you have an echocardiogram in 1 year. Echocardiography is a painless test that uses sound waves to create images of your heart. It provides your doctor with information about the size and shape of your heart and how well your heart's chambers and valves are working. This procedure takes approximately one hour. There are no restrictions for this procedure.  Follow-Up: Your provider recommends that you schedule a follow-up appointment in 3 MONTHS with Dr. Sallyanne Kuster.  Your provider wants you to follow-up in: 1 year with K. Grandville Silos, [place]. You will receive a reminder letter in the mail two months in advance. If you don't receive a letter, please call our office to schedule the follow-up appointment.    Any Other Special Instructions Will Be Listed Below (If Applicable).     If you need a refill on your cardiac medications before your next appointment, please call your pharmacy.

## 2017-03-07 ENCOUNTER — Telehealth: Payer: Self-pay | Admitting: Surgery

## 2017-03-07 LAB — BASIC METABOLIC PANEL
BUN / CREAT RATIO: 21 (ref 12–28)
BUN: 29 mg/dL — ABNORMAL HIGH (ref 8–27)
CALCIUM: 8.7 mg/dL (ref 8.7–10.3)
CHLORIDE: 97 mmol/L (ref 96–106)
CO2: 20 mmol/L (ref 20–29)
Creatinine, Ser: 1.36 mg/dL — ABNORMAL HIGH (ref 0.57–1.00)
GFR calc non Af Amer: 37 mL/min/{1.73_m2} — ABNORMAL LOW (ref >59)
GFR, EST AFRICAN AMERICAN: 43 mL/min/{1.73_m2} — AB (ref >59)
Glucose: 137 mg/dL — ABNORMAL HIGH (ref 65–99)
Potassium: 3.8 mmol/L (ref 3.5–5.2)
SODIUM: 136 mmol/L (ref 134–144)

## 2017-03-07 LAB — CBC
Hematocrit: 28.3 % — ABNORMAL LOW (ref 34.0–46.6)
Hemoglobin: 9.4 g/dL — ABNORMAL LOW (ref 11.1–15.9)
MCH: 29.4 pg (ref 26.6–33.0)
MCHC: 33.2 g/dL (ref 31.5–35.7)
MCV: 88 fL (ref 79–97)
PLATELETS: 294 10*3/uL (ref 150–379)
RBC: 3.2 x10E6/uL — ABNORMAL LOW (ref 3.77–5.28)
RDW: 15.6 % — AB (ref 12.3–15.4)
WBC: 15 10*3/uL — ABNORMAL HIGH (ref 3.4–10.8)

## 2017-03-07 NOTE — Telephone Encounter (Signed)
-----   Message from Mena Goes, RN sent at 03/07/2017 10:25 AM EDT ----- Regarding: 2 weeks per [facility] Put in appt note for him to talk about Carotids   ----- Message ----- From: Serafina Mitchell, MD Sent: 03/06/2017  11:21 PM To: [facility] Charge Pool  Please schedule the patient for Carotid evaluation when she follows up with me in 2 weeks.  She does not need a u/s.  I would like this listed on the encounter form so I don't forget to address it.  Thanks

## 2017-03-08 ENCOUNTER — Encounter: Payer: Self-pay | Admitting: Thoracic Surgery (Cardiothoracic Vascular Surgery)

## 2017-03-19 ENCOUNTER — Telehealth: Payer: Self-pay | Admitting: *Deleted

## 2017-03-19 NOTE — Telephone Encounter (Signed)
Call from [facility]. Requesting orders to continue "wound care 3 x week  Instructed to continue till patient is seen in this office on 03/25/17.

## 2017-03-25 ENCOUNTER — Telehealth: Payer: Self-pay | Admitting: Cardiovascular Disease

## 2017-03-25 ENCOUNTER — Ambulatory Visit (INDEPENDENT_AMBULATORY_CARE_PROVIDER_SITE_OTHER): Payer: Self-pay | Admitting: Surgery

## 2017-03-25 DIAGNOSIS — K661 Hemoperitoneum: Secondary | ICD-10-CM

## 2017-03-25 NOTE — Telephone Encounter (Signed)
New Message     [facility] needs order for wound care orders please call

## 2017-03-25 NOTE — Progress Notes (Signed)
Patient name: Carrie Monroe MRN: [id number] DOB: 02/06/1936 Sex: female  REASON FOR VISIT:     post op  HISTORY OF PRESENT ILLNESS:   Carrie Monroe is a 81 y.o. female who is here today for follow-up.  She is status post TAVR on 02/05/2017.  This is, located by bleeding from her cannulation site on the left.  She went back to the operating room emergently and had repair of the arteriotomy.  On 02/22/2017 she was taken back to the operating room for I and D of her wound.  Her wound went way up into the retroperitoneum.  This was irrigated and debridement with placement of antibiotic beads and wound VAC.  She is here today for follow-up.  CURRENT MEDICATIONS:    Current Outpatient Prescriptions  Medication Sig Dispense Refill  . allopurinol (ZYLOPRIM) 300 MG tablet Take 300 mg by mouth daily as needed (gout).    Marland Kitchen aspirin 81 MG chewable tablet Chew 1 tablet (81 mg total) by mouth daily.    . Cholecalciferol (VITAMIN D PO) Take 1 capsule by mouth daily.    . clopidogrel (PLAVIX) 75 MG tablet Take 1 tablet (75 mg total) by mouth daily with breakfast. 30 tablet 6  . colchicine 0.6 MG tablet Take 0.6 mg by mouth every 8 (eight) hours as needed (gout flare).    . ferrous sulfate 325 (65 FE) MG tablet Take 325 mg by mouth daily with breakfast.    . furosemide (LASIX) 40 MG tablet Take 40 mg by mouth daily.    . hydrALAZINE (APRESOLINE) 50 MG tablet Take 50 mg by mouth every 8 (eight) hours.    . insulin detemir (LEVEMIR) 100 UNIT/ML injection Inject 28 Units into the skin 2 (two) times daily.    . insulin regular (NOVOLIN R,HUMULIN R) 100 units/mL injection Inject 0-12 Units into the skin as directed. Based on sliding scale. Patient states she doesn't use insulin at home. She is being given it while she is at the assisted living facility    . metoprolol tartrate (LOPRESSOR) 25 MG tablet Take 1 tablet (25 mg total) by mouth 2 (two) times daily. 60  tablet 5  . oxyCODONE-acetaminophen (PERCOCET/ROXICET) 5-325 MG tablet Take 1-2 tablets by mouth every 4 (four) hours as needed for moderate pain. 30 tablet 0  . rosuvastatin (CRESTOR) 10 MG tablet Take 10 mg by mouth daily.   0  . tamoxifen (NOLVADEX) 20 MG tablet Take 1 tablet by mouth every morning. 90 tablet 3   No current facility-administered medications for this visit.     REVIEW OF SYSTEMS:   [X]  denotes positive finding, [ ]  denotes negative finding Cardiac  Comments:  Chest pain or chest pressure:    Shortness of breath upon exertion:    Short of breath when lying flat:    Irregular heart rhythm:    Constitutional    Fever or chills:      PHYSICAL EXAM:   There were no vitals filed for this visit.  GENERAL: The patient is a well-nourished female, in no acute distress. The vital signs are documented above. CARDIOVASCULAR: There is a regular rate and rhythm. PULMONARY: Non-labored respirations Wound VAC was changed today.  There is healthy granulation tissue at the base of the wound  STUDIES:   None   MEDICAL ISSUES:   Retroperitoneal wound: There appears to be healthy granulation tissue.  It appears to be healing nicely.  I have recommended continuing the wound VAC.  I  will have her follow-up in 6 weeks  Carotid stenosis: The patient does not want to pursue carotid endarterectomy at this time.  We will revisit this issue with a repeat ultrasound when she follows up in 6 weeks.  [name], MD [facility] [phone] Pager [phone]

## 2017-03-25 NOTE — Telephone Encounter (Signed)
Referred Nicolette to call Dr. Wyline Mood office about wound care.

## 2017-03-26 ENCOUNTER — Telehealth: Payer: Self-pay | Admitting: Surgery

## 2017-03-26 ENCOUNTER — Telehealth: Payer: Self-pay | Admitting: *Deleted

## 2017-03-26 NOTE — Telephone Encounter (Signed)
Returned call to [facility] home health nurse, Santiago Glad.  She was requesting verbal orders to replace wound vac using the black sponge.  Left message on voice mail with verbal order as requested.

## 2017-03-26 NOTE — Telephone Encounter (Signed)
Called and left message for Tillie Rung, PT for [facility].  I gave verbal order as her request for Pt protocol.  I was in patient notes for wound vac orders and saw a phone note from Donald Pore, office manager,  that Tillie Rung had requested orders.  Orders were left on cell # listed.

## 2017-03-26 NOTE — Telephone Encounter (Signed)
Ms. Marvel Plan is a physical therapist who is following up on a call she placed Friday to our office for a physical therapy order for this patient.  She reports that she left the details on voicemail.    Please contact her to let her know if Dr. Trula Slade agreed to write physical therapy orders.  Carrie Monroe

## 2017-03-26 NOTE — Telephone Encounter (Signed)
Returned call to [employer], nurse with [facility] who was requesting an order for the wound vac.  Verbal order was given for standard vac care, cleansing, measuring wound applying black foam and securing.  Changing the vac three times weekly.  Monday, Wednesday, and Friday.

## 2017-03-27 NOTE — Addendum Note (Signed)
Addended by: Lianne Cure A on: 03/27/2017 04:07 PM   Modules accepted: Orders

## 2017-03-28 ENCOUNTER — Telehealth: Payer: Self-pay | Admitting: Cardiovascular Disease

## 2017-03-28 NOTE — Telephone Encounter (Signed)
New message    Update on patient Carrie Monroe with [facility] called : today with therapy patient had chest tightness when walking 40 feet 2x .  She states she checked her heart rate and 124bpm and O2 was 87% , the chest tightness went away when resting for 10 minutes but heart rate was 112  , very tired   Patient is taking medication 2x a day instead of 1 pill every 8 hours  hydralizine ?

## 2017-03-28 NOTE — Telephone Encounter (Signed)
New message     Home health care worker - returning your call about patient

## 2017-03-28 NOTE — Telephone Encounter (Signed)
Called and spoke to patient she states she had only one episode of fleeting chest tightness after walking with physical therapy. Patient states episode has only happen once.NO OTHER SYMPTOMS.  Informed patient to continue to monitor symptoms if occurs more frequent or last longer Contact office.

## 2017-03-28 NOTE — Telephone Encounter (Addendum)
Carrie Monroe (PT),to call back.

## 2017-03-28 NOTE — Telephone Encounter (Signed)
Agree with plan Brian Crenshaw  

## 2017-03-28 NOTE — Telephone Encounter (Signed)
See other message  Message relayed message from MD to [facility]

## 2017-04-03 ENCOUNTER — Inpatient Hospital Stay (HOSPITAL_COMMUNITY)
Admission: EM | Admit: 2017-04-03 | Discharge: 2017-04-05 | DRG: 857 | Disposition: A | Discharge status: Home Health | Payer: Medicare Other | Attending: Family Medicine | Admitting: Family Medicine

## 2017-04-03 ENCOUNTER — Encounter (HOSPITAL_COMMUNITY): Payer: Self-pay | Admitting: Emergency Medicine

## 2017-04-03 DIAGNOSIS — Z170 Estrogen receptor positive status [ER+]: Secondary | ICD-10-CM | POA: Diagnosis not present

## 2017-04-03 DIAGNOSIS — Z923 Personal history of irradiation: Secondary | ICD-10-CM | POA: Diagnosis not present

## 2017-04-03 DIAGNOSIS — I130 Hypertensive heart and chronic kidney disease with heart failure and stage 1 through stage 4 chronic kidney disease, or unspecified chronic kidney disease: Secondary | ICD-10-CM | POA: Diagnosis present

## 2017-04-03 DIAGNOSIS — E785 Hyperlipidemia, unspecified: Secondary | ICD-10-CM | POA: Diagnosis present

## 2017-04-03 DIAGNOSIS — T148XXA Other injury of unspecified body region, initial encounter: Secondary | ICD-10-CM | POA: Diagnosis present

## 2017-04-03 DIAGNOSIS — Z952 Presence of prosthetic heart valve: Secondary | ICD-10-CM

## 2017-04-03 DIAGNOSIS — N183 Chronic kidney disease, stage 3 unspecified: Secondary | ICD-10-CM | POA: Diagnosis present

## 2017-04-03 DIAGNOSIS — C50412 Malignant neoplasm of upper-outer quadrant of left female breast: Secondary | ICD-10-CM | POA: Diagnosis not present

## 2017-04-03 DIAGNOSIS — Z7902 Long term (current) use of antithrombotics/antiplatelets: Secondary | ICD-10-CM

## 2017-04-03 DIAGNOSIS — Z808 Family history of malignant neoplasm of other organs or systems: Secondary | ICD-10-CM

## 2017-04-03 DIAGNOSIS — Z9842 Cataract extraction status, left eye: Secondary | ICD-10-CM

## 2017-04-03 DIAGNOSIS — M109 Gout, unspecified: Secondary | ICD-10-CM | POA: Diagnosis not present

## 2017-04-03 DIAGNOSIS — T8132XA Disruption of internal operation (surgical) wound, not elsewhere classified, initial encounter: Secondary | ICD-10-CM | POA: Diagnosis not present

## 2017-04-03 DIAGNOSIS — Z87442 Personal history of urinary calculi: Secondary | ICD-10-CM | POA: Diagnosis not present

## 2017-04-03 DIAGNOSIS — I5032 Chronic diastolic (congestive) heart failure: Secondary | ICD-10-CM | POA: Diagnosis not present

## 2017-04-03 DIAGNOSIS — Z9049 Acquired absence of other specified parts of digestive tract: Secondary | ICD-10-CM | POA: Diagnosis not present

## 2017-04-03 DIAGNOSIS — Z8249 Family history of ischemic heart disease and other diseases of the circulatory system: Secondary | ICD-10-CM

## 2017-04-03 DIAGNOSIS — I10 Essential (primary) hypertension: Secondary | ICD-10-CM | POA: Diagnosis not present

## 2017-04-03 DIAGNOSIS — E119 Type 2 diabetes mellitus without complications: Secondary | ICD-10-CM | POA: Diagnosis present

## 2017-04-03 DIAGNOSIS — Z853 Personal history of malignant neoplasm of breast: Secondary | ICD-10-CM

## 2017-04-03 DIAGNOSIS — I2510 Atherosclerotic heart disease of native coronary artery without angina pectoris: Secondary | ICD-10-CM | POA: Diagnosis present

## 2017-04-03 DIAGNOSIS — Z961 Presence of intraocular lens: Secondary | ICD-10-CM | POA: Diagnosis present

## 2017-04-03 DIAGNOSIS — Z96651 Presence of right artificial knee joint: Secondary | ICD-10-CM | POA: Diagnosis present

## 2017-04-03 DIAGNOSIS — Z7982 Long term (current) use of aspirin: Secondary | ICD-10-CM | POA: Diagnosis not present

## 2017-04-03 DIAGNOSIS — L089 Local infection of the skin and subcutaneous tissue, unspecified: Secondary | ICD-10-CM | POA: Diagnosis present

## 2017-04-03 DIAGNOSIS — Z833 Family history of diabetes mellitus: Secondary | ICD-10-CM | POA: Diagnosis not present

## 2017-04-03 DIAGNOSIS — E669 Obesity, unspecified: Secondary | ICD-10-CM

## 2017-04-03 DIAGNOSIS — D500 Iron deficiency anemia secondary to blood loss (chronic): Secondary | ICD-10-CM | POA: Diagnosis not present

## 2017-04-03 DIAGNOSIS — T8149XA Infection following a procedure, other surgical site, initial encounter: Principal | ICD-10-CM | POA: Diagnosis present

## 2017-04-03 DIAGNOSIS — E1169 Type 2 diabetes mellitus with other specified complication: Secondary | ICD-10-CM | POA: Diagnosis present

## 2017-04-03 DIAGNOSIS — N2889 Other specified disorders of kidney and ureter: Secondary | ICD-10-CM | POA: Diagnosis present

## 2017-04-03 DIAGNOSIS — Z9841 Cataract extraction status, right eye: Secondary | ICD-10-CM

## 2017-04-03 DIAGNOSIS — Y838 Other surgical procedures as the cause of abnormal reaction of the patient, or of later complication, without mention of misadventure at the time of the procedure: Secondary | ICD-10-CM | POA: Diagnosis present

## 2017-04-03 DIAGNOSIS — E1122 Type 2 diabetes mellitus with diabetic chronic kidney disease: Secondary | ICD-10-CM | POA: Diagnosis not present

## 2017-04-03 DIAGNOSIS — I9789 Other postprocedural complications and disorders of the circulatory system, not elsewhere classified: Secondary | ICD-10-CM | POA: Diagnosis not present

## 2017-04-03 LAB — CBC WITH DIFFERENTIAL/PLATELET
BASOS ABS: 0 10*3/uL (ref 0.0–0.1)
Basophils Relative: 0 %
Eosinophils Absolute: 0.2 10*3/uL (ref 0.0–0.7)
Eosinophils Relative: 4 %
HEMATOCRIT: 25.8 % — AB (ref 36.0–46.0)
HEMOGLOBIN: 8.2 g/dL — AB (ref 12.0–15.0)
LYMPHS ABS: 1 10*3/uL (ref 0.7–4.0)
LYMPHS PCT: 16 %
MCH: 30.7 pg (ref 26.0–34.0)
MCHC: 31.8 g/dL (ref 30.0–36.0)
MCV: 96.6 fL (ref 78.0–100.0)
Monocytes Absolute: 0.5 10*3/uL (ref 0.1–1.0)
Monocytes Relative: 8 %
NEUTROS ABS: 4.6 10*3/uL (ref 1.7–7.7)
Neutrophils Relative %: 72 %
Platelets: 133 10*3/uL — ABNORMAL LOW (ref 150–400)
RBC: 2.67 MIL/uL — AB (ref 3.87–5.11)
RDW: 20.8 % — ABNORMAL HIGH (ref 11.5–15.5)
WBC: 6.3 10*3/uL (ref 4.0–10.5)

## 2017-04-03 LAB — COMPREHENSIVE METABOLIC PANEL
ALK PHOS: 57 U/L (ref 38–126)
ALT: 11 U/L — AB (ref 14–54)
AST: 21 U/L (ref 15–41)
Albumin: 2.6 g/dL — ABNORMAL LOW (ref 3.5–5.0)
Anion gap: 8 (ref 5–15)
BILIRUBIN TOTAL: 0.4 mg/dL (ref 0.3–1.2)
BUN: 24 mg/dL — AB (ref 6–20)
CALCIUM: 8.7 mg/dL — AB (ref 8.9–10.3)
CHLORIDE: 107 mmol/L (ref 101–111)
CO2: 23 mmol/L (ref 22–32)
CREATININE: 1.28 mg/dL — AB (ref 0.44–1.00)
GFR, EST AFRICAN AMERICAN: 45 mL/min — AB (ref >60)
GFR, EST NON AFRICAN AMERICAN: 39 mL/min — AB (ref >60)
Glucose, Bld: 276 mg/dL — ABNORMAL HIGH (ref 65–99)
Potassium: 3.4 mmol/L — ABNORMAL LOW (ref 3.5–5.1)
Sodium: 138 mmol/L (ref 135–145)
TOTAL PROTEIN: 5.9 g/dL — AB (ref 6.5–8.1)

## 2017-04-03 LAB — CBG MONITORING, ED: Glucose-Capillary: 169 mg/dL — ABNORMAL HIGH (ref 65–99)

## 2017-04-03 MED ORDER — SODIUM CHLORIDE 0.9% FLUSH: 3.0000 mL | INTRAVENOUS | Status: DC | PRN

## 2017-04-03 MED ORDER — METOPROLOL TARTRATE 25 MG PO TABS
25.0000 mg | ORAL_TABLET | Freq: Two times a day (BID) | ORAL | Status: DC
  Administered 2017-04-04 – 2017-04-05 (×4): 25 mg via ORAL
  Filled 2017-04-03 (×4): qty 1

## 2017-04-03 MED ORDER — TAMOXIFEN CITRATE 10 MG PO TABS
20.0000 mg | ORAL_TABLET | Freq: Every morning | ORAL | Status: DC
Start: 2017-04-04 — End: 2017-04-05
  Administered 2017-04-05: 20 mg via ORAL
  Filled 2017-04-03 (×2): qty 2

## 2017-04-03 MED ORDER — CLOPIDOGREL BISULFATE 75 MG PO TABS
75.0000 mg | ORAL_TABLET | Freq: Every day | ORAL | Status: DC
  Administered 2017-04-05: 75 mg via ORAL
  Filled 2017-04-03: qty 1

## 2017-04-03 MED ORDER — ZINC SULFATE 220 (50 ZN) MG PO CAPS
220.0000 mg | ORAL_CAPSULE | Freq: Every day | ORAL | Status: DC
Start: 2017-04-04 — End: 2017-04-05
  Administered 2017-04-05: 220 mg via ORAL
  Filled 2017-04-03: qty 1

## 2017-04-03 MED ORDER — SODIUM CHLORIDE 0.9 % IV SOLN: 250.0000 mL | INTRAVENOUS | Status: DC | PRN

## 2017-04-03 MED ORDER — BISACODYL 5 MG PO TBEC: 5.0000 mg | DELAYED_RELEASE_TABLET | Freq: Every day | ORAL | Status: DC | PRN

## 2017-04-03 MED ORDER — SENNOSIDES-DOCUSATE SODIUM 8.6-50 MG PO TABS: 1.0000 | ORAL_TABLET | Freq: Every evening | ORAL | Status: DC | PRN

## 2017-04-03 MED ORDER — INSULIN ASPART 100 UNIT/ML ~~LOC~~ SOLN
0.0000 [IU] | SUBCUTANEOUS | Status: DC
  Administered 2017-04-04: 2 [IU] via SUBCUTANEOUS
  Administered 2017-04-04: 5 [IU] via SUBCUTANEOUS
  Administered 2017-04-04: 3 [IU] via SUBCUTANEOUS
  Administered 2017-04-04: 1 [IU] via SUBCUTANEOUS
  Administered 2017-04-05 (×2): 2 [IU] via SUBCUTANEOUS
  Administered 2017-04-05: 3 [IU] via SUBCUTANEOUS
  Filled 2017-04-03: qty 1

## 2017-04-03 MED ORDER — PIPERACILLIN-TAZOBACTAM 3.375 G IVPB
3.3750 g | Freq: Three times a day (TID) | INTRAVENOUS | Status: DC
  Administered 2017-04-04 – 2017-04-05 (×3): 3.375 g via INTRAVENOUS
  Filled 2017-04-03 (×5): qty 50

## 2017-04-03 MED ORDER — ACETAMINOPHEN 325 MG PO TABS: 650.0000 mg | ORAL_TABLET | Freq: Four times a day (QID) | ORAL | Status: DC | PRN

## 2017-04-03 MED ORDER — ACETAMINOPHEN 650 MG RE SUPP: 650.0000 mg | Freq: Four times a day (QID) | RECTAL | Status: DC | PRN

## 2017-04-03 MED ORDER — ROSUVASTATIN CALCIUM 10 MG PO TABS
10.0000 mg | ORAL_TABLET | Freq: Every day | ORAL | Status: DC
  Administered 2017-04-05: 10 mg via ORAL
  Filled 2017-04-03 (×2): qty 1

## 2017-04-03 MED ORDER — VITAMIN B-12 100 MCG PO TABS
100.0000 ug | ORAL_TABLET | Freq: Every day | ORAL | Status: DC
  Administered 2017-04-05: 100 ug via ORAL
  Filled 2017-04-03 (×2): qty 1

## 2017-04-03 MED ORDER — OXYCODONE-ACETAMINOPHEN 5-325 MG PO TABS: 1.0000 | ORAL_TABLET | ORAL | Status: DC | PRN

## 2017-04-03 MED ORDER — ASPIRIN 81 MG PO CHEW
81.0000 mg | CHEWABLE_TABLET | Freq: Every day | ORAL | Status: DC
  Administered 2017-04-05: 81 mg via ORAL
  Filled 2017-04-03: qty 1

## 2017-04-03 MED ORDER — ONDANSETRON HCL 4 MG PO TABS: 4.0000 mg | ORAL_TABLET | Freq: Four times a day (QID) | ORAL | Status: DC | PRN

## 2017-04-03 MED ORDER — SODIUM CHLORIDE 0.9% FLUSH
3.0000 mL | Freq: Two times a day (BID) | INTRAVENOUS | Status: DC
  Administered 2017-04-04: 3 mL via INTRAVENOUS

## 2017-04-03 MED ORDER — HYDRALAZINE HCL 50 MG PO TABS
50.0000 mg | ORAL_TABLET | Freq: Two times a day (BID) | ORAL | Status: DC
  Administered 2017-04-04 – 2017-04-05 (×4): 50 mg via ORAL
  Filled 2017-04-03 (×4): qty 1

## 2017-04-03 MED ORDER — ONDANSETRON HCL 4 MG/2ML IJ SOLN: 4.0000 mg | Freq: Four times a day (QID) | INTRAMUSCULAR | Status: DC | PRN

## 2017-04-03 MED ORDER — PIPERACILLIN-TAZOBACTAM 3.375 G IVPB 30 MIN
3.3750 g | Freq: Once | INTRAVENOUS | Status: AC
  Administered 2017-04-03: 3.375 g via INTRAVENOUS
  Filled 2017-04-03: qty 50

## 2017-04-03 NOTE — ED Provider Notes (Signed)
[facility] EMERGENCY DEPARTMENT Provider Note   CSN: [id number] Arrival date & time: 04/03/17  1808     History   Chief Complaint Chief Complaint  Patient presents with  . Wound Check    HPI Carrie Monroe is a 81 y.o. female.  The history is provided by the patient. No language interpreter was used.  Wound Check  This is a new problem. The current episode started yesterday. The problem occurs constantly. The problem has been gradually worsening. Pertinent negatives include no chest pain. Nothing aggravates the symptoms. Nothing relieves the symptoms. She has tried nothing for the symptoms. The treatment provided no relief.  Pt complains of drainage from wound.  Pt reports home health nurse is concerned because she was having more bleeding and a foul odor from site.  Pt has a wound vac. Pt's daughter in law reports worsening look to wound.    Past Medical History:  Diagnosis Date  . Aortic stenosis, severe    a. 01/2017: s/p TAVR; hospital course complicated by large groin hematoma/wound  . Arthritis   . Breast cancer ([place])    a. L breast cancer s/p lumpectomy and XRT  . Carotid artery stenosis    a. 11/2016: 80-99% RICA stenosis, 1-39% vs low range 52-84% LICA   . Chronic diastolic CHF (congestive heart failure) ([place])   . CKD (chronic kidney disease)   . Coronary artery disease    a. 11/2016: diagnosed with multivessel CAD, turned down for CABG and underwent PCI/DES to mLCx, PCI/DES to mLAD and PCTA of ostial diagonal on 12/28/16  . Diabetes mellitus    a. diet controlled   . Exogenous obesity   . Gout   . Hemorrhoids   . History of kidney stones   . Hyperlipidemia   . Hypertension   . 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 Acute respiratory failure with  hypoxia ([place])   . Hemorrhagic shock ([place])   . 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 Carotid artery stenosis   . Coronary artery disease   . Chronic renal insufficiency, stage III (moderate) ([place]) 12/22/2016  . Dental abscess- s/p multiple tooth extraction 12/21/16 12/22/2016  . Essential hypertension 01/27/2016  . Morbid obesity due to excess calories ([place]) 01/27/2016  . Gout 06/22/2013  . Dyslipidemia 05/08/2011  . Diabetes mellitus type 2 in obese ([place]) 05/08/2011  . Chronic diastolic heart failure ([place]) 05/08/2011  . Severe aortic stenosis 05/08/2011  . Breast cancer of upper-outer quadrant of left female breast ([place]) 03/16/2011    Past Surgical History:  Procedure Laterality Date  . BREAST LUMPECTOMY  2009   left  . CATARACT EXTRACTION    . CHOLECYSTECTOMY  2010  . EYE SURGERY     BILATERAL CATARACT EXTRACTIONS AND LENS IMPLANTS  . KNEE SURGERY     right PARTIAL KNEE REPLACEMENT  . SHOULDER SURGERY     right  . SKIN GRAFT TO RIGHT HAND     AFTER BURN TO THE HAND  . US ECHOCARDIOGRAPHY  05/15/2010   EF 60-65%    OB History    No data available       Home Medications    Prior to Admission medications   Medication Sig Start Date End Date Taking? Authorizing Provider  allopurinol (ZYLOPRIM) 300  MG tablet Take 300 mg by mouth daily as needed (gout).   Yes [provider]  aspirin 81 MG chewable tablet Chew 1 tablet (81 mg total) by mouth daily. 12/23/16  Yes Kilroy, Doreene Burke, PA-C  Cholecalciferol (VITAMIN D PO) Take 1 capsule by mouth daily.   Yes [provider]  clopidogrel (PLAVIX) 75 MG tablet Take 1 tablet (75 mg total) by mouth daily with breakfast. 12/30/16  Yes Eileen Stanford, PA-C  colchicine 0.6 MG tablet Take 0.6 mg by mouth every 8 (eight) hours as needed (gout flare).   Yes [provider]  COLLAGEN PO Take by mouth. power   Yes [provider]  furosemide (LASIX) 40 MG tablet Take 40 mg by  mouth daily.   Yes [provider]  hydrALAZINE (APRESOLINE) 50 MG tablet Take 50 mg 2 (two) times daily by mouth.    Yes [provider]  metoprolol tartrate (LOPRESSOR) 25 MG tablet Take 1 tablet (25 mg total) by mouth 2 (two) times daily. 12/22/16  Yes Kilroy, Doreene Burke, PA-C  Misc Natural Products (FIBER 7 PO) Take by mouth. Hemp power   Yes [provider]  oxyCODONE-acetaminophen (PERCOCET/ROXICET) 5-325 MG tablet Take 1-2 tablets by mouth every 4 (four) hours as needed for moderate pain. 02/27/17  Yes Eileen Stanford, PA-C  rosuvastatin (CRESTOR) 10 MG tablet Take 10 mg by mouth daily.  01/17/16  Yes [provider]  tamoxifen (NOLVADEX) 20 MG tablet Take 1 tablet by mouth every morning. 03/26/16  Yes Nicholas Lose, MD  vitamin B-12 (CYANOCOBALAMIN) 100 MCG tablet Take 100 mcg daily by mouth.   Yes [provider]  zinc sulfate 220 (50 Zn) MG capsule Take 220 mg daily by mouth.   Yes [provider]    Family History Family History  Problem Relation Age of Onset  . Cancer Mother        pancreatic  . Heart disease Father   . Hypertension Father   . Cancer Brother   . Diabetes Sister     Social History Social History   Tobacco Use  . Smoking status: Never Smoker  . Smokeless tobacco: Never Used  Substance Use Topics  . Alcohol use: No  . Drug use: No     Allergies   Patient has no known allergies.   Review of Systems Review of Systems  Cardiovascular: Negative for chest pain.  All other systems reviewed and are negative.    Physical Exam Updated Vital Signs BP (!) 140/46   Pulse 81   Temp 98.1 F (36.7 C)   Resp 18   Ht 5\' 3"  (1.6 m)   Wt 108.9 kg (240 lb)   SpO2 98%   BMI 42.51 kg/m   Physical Exam  Constitutional: She appears well-developed and well-nourished. No distress.  HENT:  Head: Normocephalic and atraumatic.  Eyes: Conjunctivae are normal.  Neck: Neck supple.  Cardiovascular: Normal rate  and regular rhythm.  No murmur heard. Pulmonary/Chest: Effort normal and breath sounds normal. No respiratory distress.  Abdominal: Soft. There is no tenderness.  25x20 cm red wound  2x12 cm open deep area, oozing,  Small amount of bleeding,  Foul odor.   Musculoskeletal: She exhibits no edema.  Neurological: She is alert.  Skin: Skin is warm and dry.  Psychiatric: She has a normal mood and affect.  Nursing note and vitals reviewed.    ED Treatments / Results  Labs (all labs ordered are listed, but only  abnormal results are displayed) Labs Reviewed  CBC WITH DIFFERENTIAL/PLATELET - Abnormal; Notable for the following components:      Result Value   RBC 2.67 (*)    Hemoglobin 8.2 (*)    HCT 25.8 (*)    RDW 20.8 (*)    Platelets 133 (*)    All other components within normal limits  COMPREHENSIVE METABOLIC PANEL - Abnormal; Notable for the following components:   Potassium 3.4 (*)    Glucose, Bld 276 (*)    BUN 24 (*)    Creatinine, Ser 1.28 (*)    Calcium 8.7 (*)    Total Protein 5.9 (*)    Albumin 2.6 (*)    ALT 11 (*)    GFR calc non Af Amer 39 (*)    GFR calc Af Amer 45 (*)    All other components within normal limits    EKG  EKG Interpretation None       Radiology No results found.  Procedures Procedures (including critical care time)  Medications Ordered in ED Medications - No data to display   Initial Impression / Assessment and Plan / ED Course  I have reviewed the triage vital signs and the nursing notes.  Pertinent labs & imaging results that were available during my care of the patient were reviewed by me and considered in my medical decision making (see chart for details).     Dr. Lacinda Axon in to see and examine pt.  I spoke to Dr. Trula Slade Vascular surgeon .  He request hospitalist admit for IV antibiotics. Zosyn   He advised keep pt NPO after midnight.  He will plan on wound debridement in OR tomorrow Consult  To hospitalist.  Dr. Blima Singer will admit.    Final Clinical Impressions(s) / ED Diagnoses   Final diagnoses:  Wound infection    ED Discharge Orders    None       Sidney Ace 04/03/17 2209    Fransico Meadow, [place] 04/03/17 2316    Nat Christen, MD 04/05/17 1343

## 2017-04-03 NOTE — ED Notes (Signed)
Pt. Had noticeable swelling on left leg compared to right. Pulse is bounding on left lower extremity. Wound is red around area and drainage is yellow. Will notify MD.

## 2017-04-03 NOTE — H&P (Signed)
History and Physical    Carrie Monroe:[id number] DOB: 1935/06/18 DOA: 04/03/2017  PCP: Marton Redwood, MD   Patient coming from: Home  Chief Complaint: Increased bleeding and foul odor from left groin wound   HPI: Carrie Monroe is a 81 y.o. female with medical history significant for cancer of the left breast status post lumpectomy and radiation, chronic diastolic CHF, chronic kidney disease stage III, coronary artery disease, and TAVR in September with wound complications, now presenting to the emergency department for evaluation of increased bleeding and foul odor from the left groin wound.  After the patient's TA VR, she was taken back to the operating room on 02/22/2017 for debridement of the wound which was extensive, mid back into the retroperitoneum.  Antibiotic beads and wound VAC was placed.  She has had home health RN changing dressings 3 times weekly, and the RN became concerned today for increased bleeding from the wound, increased drainage, and increasingly foul odor.  She recommended evaluation in the ED.  Patient denies fevers or chills.  ED Course: Upon arrival to the ED, patient is found to be afebrile, saturating well on room air, and with vitals otherwise stable.  Chemistry panel is notable for a serum creatinine 1.28, better than recent priors.  Serum glucose is elevated to 276.  CBC is notable for a normocytic anemia with hemoglobin of 8.2, down from 9.4 one month earlier.  Vascular surgery was consulted by the ED physician, recommended medical admission with IV antibiotics, and indicated that they will plan to take the patient to the OR for debridement tomorrow.  Patient remained hemodynamically stable in the ED and has not been in any apparent respiratory distress.  She will be admitted to the medical-surgical unit for ongoing evaluation and management of wound complication.  Review of Systems:  All other systems reviewed and apart from HPI, are negative.  Past  Medical History:  Diagnosis Date  . Aortic stenosis, severe    a. 01/2017: s/p TAVR; hospital course complicated by large groin hematoma/wound  . Arthritis   . Breast cancer ([place])    a. L breast cancer s/p lumpectomy and XRT  . Carotid artery stenosis    a. 11/2016: 80-99% RICA stenosis, 1-39% vs low range 95-62% LICA   . Chronic diastolic CHF (congestive heart failure) ([place])   . CKD (chronic kidney disease)   . Coronary artery disease    a. 11/2016: diagnosed with multivessel CAD, turned down for CABG and underwent PCI/DES to mLCx, PCI/DES to mLAD and PCTA of ostial diagonal on 12/28/16  . Diabetes mellitus    a. diet controlled   . Exogenous obesity   . Gout   . Hemorrhoids   . History of kidney stones   . Hyperlipidemia   . Hypertension   . 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 BREAST LUMPECTOMY  2009   left  . CATARACT EXTRACTION    . CHOLECYSTECTOMY  2010  . EYE SURGERY     BILATERAL CATARACT EXTRACTIONS AND LENS IMPLANTS  . KNEE SURGERY     right PARTIAL KNEE REPLACEMENT  . SHOULDER SURGERY     right  . SKIN GRAFT TO RIGHT HAND     AFTER BURN TO THE HAND  . US ECHOCARDIOGRAPHY  05/15/2010   EF 60-65%  reports that  has never smoked. she has never used smokeless tobacco. She reports that she does not drink alcohol or use drugs.  No Known Allergies  Family History  Problem Relation Age of Onset  . Cancer Mother        pancreatic  . Heart disease Father   . Hypertension Father   . Cancer Brother   . Diabetes Sister      Prior to Admission medications   Medication Sig Start Date End Date Taking? Authorizing Provider  allopurinol (ZYLOPRIM) 300 MG tablet Take 300 mg by mouth daily as needed (gout).   Yes [provider]  aspirin 81 MG chewable tablet Chew 1  tablet (81 mg total) by mouth daily. 12/23/16  Yes Kilroy, Doreene Burke, PA-C  Cholecalciferol (VITAMIN D PO) Take 1 capsule by mouth daily.   Yes [provider]  clopidogrel (PLAVIX) 75 MG tablet Take 1 tablet (75 mg total) by mouth daily with breakfast. 12/30/16  Yes Eileen Stanford, PA-C  colchicine 0.6 MG tablet Take 0.6 mg by mouth every 8 (eight) hours as needed (gout flare).   Yes [provider]  COLLAGEN PO Take by mouth. power   Yes [provider]  furosemide (LASIX) 40 MG tablet Take 40 mg by mouth daily.   Yes [provider]  hydrALAZINE (APRESOLINE) 50 MG tablet Take 50 mg 2 (two) times daily by mouth.    Yes [provider]  metoprolol tartrate (LOPRESSOR) 25 MG tablet Take 1 tablet (25 mg total) by mouth 2 (two) times daily. 12/22/16  Yes Kilroy, Doreene Burke, PA-C  Misc Natural Products (FIBER 7 PO) Take by mouth. Hemp power   Yes [provider]  oxyCODONE-acetaminophen (PERCOCET/ROXICET) 5-325 MG tablet Take 1-2 tablets by mouth every 4 (four) hours as needed for moderate pain. 02/27/17  Yes Eileen Stanford, PA-C  rosuvastatin (CRESTOR) 10 MG tablet Take 10 mg by mouth daily.  01/17/16  Yes [provider]  tamoxifen (NOLVADEX) 20 MG tablet Take 1 tablet by mouth every morning. 03/26/16  Yes Nicholas Lose, MD  vitamin B-12 (CYANOCOBALAMIN) 100 MCG tablet Take 100 mcg daily by mouth.   Yes [provider]  zinc sulfate 220 (50 Zn) MG capsule Take 220 mg daily by mouth.   Yes [provider]    Physical Exam: Vitals:   04/03/17 2000 04/03/17 2100 04/03/17 2115 04/03/17 2200  BP: (!) 135/47 (!) 141/50 (!) 140/46 (!) 127/52  Pulse: 80 86 81 86  Resp:      Temp:      SpO2: 97% 98% 98% 96%  Weight:      Height:          Constitutional: NAD, calm, comfortable Eyes: PERTLA, lids and conjunctivae normal ENMT: Mucous membranes are moist. Posterior pharynx clear of any exudate or lesions.   Neck: normal,  supple, no masses, no thyromegaly Respiratory: clear to auscultation bilaterally, no wheezing, no crackles. Normal respiratory effort.    Cardiovascular: S1 & S2 heard, regular rate and rhythm. No significant JVD. Abdomen: No distension, no tenderness, no masses palpated. Bowel sounds normal.  Musculoskeletal: no clubbing / cyanosis. No joint deformity upper and lower extremities.  Skin: Left groin wound with slight odor, maceration at margins, sanguinous drainage. Skin otherwise warm, dry, well-perfused. Neurologic: CN 2-12 grossly intact. Sensation intact. Strength 5/5 in all 4 limbs.  Psychiatric: Alert and oriented x 3. Pleasant and cooperative.     Labs on Admission: I have personally  reviewed following labs and imaging studies  CBC: Recent Labs  Lab 04/03/17 1923  WBC 6.3  NEUTROABS 4.6  HGB 8.2*  HCT 25.8*  MCV 96.6  PLT 001*   Basic Metabolic Panel: Recent Labs  Lab 04/03/17 1923  NA 138  K 3.4*  CL 107  CO2 23  GLUCOSE 276*  BUN 24*  CREATININE 1.28*  CALCIUM 8.7*   GFR: Estimated Creatinine Clearance: 42.2 mL/min (A) (by C-G formula based on SCr of 1.28 mg/dL (H)). Liver Function Tests: Recent Labs  Lab 04/03/17 1923  AST 21  ALT 11*  ALKPHOS 57  BILITOT 0.4  PROT 5.9*  ALBUMIN 2.6*   No results for input(s): LIPASE, AMYLASE in the last 168 hours. No results for input(s): AMMONIA in the last 168 hours. Coagulation Profile: No results for input(s): INR, PROTIME in the last 168 hours. Cardiac Enzymes: No results for input(s): CKTOTAL, CKMB, CKMBINDEX, TROPONINI in the last 168 hours. BNP (last 3 results) No results for input(s): PROBNP in the last 8760 hours. HbA1C: No results for input(s): HGBA1C in the last 72 hours. CBG: No results for input(s): GLUCAP in the last 168 hours. Lipid Profile: No results for input(s): CHOL, HDL, LDLCALC, TRIG, CHOLHDL, LDLDIRECT in the last 72 hours. Thyroid Function Tests: No results for input(s): TSH, T4TOTAL,  FREET4, T3FREE, THYROIDAB in the last 72 hours. Anemia Panel: No results for input(s): VITAMINB12, FOLATE, FERRITIN, TIBC, IRON, RETICCTPCT in the last 72 hours. Urine analysis:    Component Value Date/Time   COLORURINE YELLOW 02/01/2017 1048   APPEARANCEUR CLEAR 02/01/2017 1048   LABSPEC 1.013 02/01/2017 1048   PHURINE 6.0 02/01/2017 1048   GLUCOSEU NEGATIVE 02/01/2017 1048   HGBUR NEGATIVE 02/01/2017 1048   BILIRUBINUR NEGATIVE 02/01/2017 1048   KETONESUR NEGATIVE 02/01/2017 1048   PROTEINUR NEGATIVE 02/01/2017 1048   UROBILINOGEN 1.0 07/28/2013 1305   NITRITE NEGATIVE 02/01/2017 1048   LEUKOCYTESUR NEGATIVE 02/01/2017 1048   Sepsis Labs: @LABRCNTIP (procalcitonin:4,lacticidven:4) )No results found for this or any previous visit (from the past 240 hour(s)).   Radiological Exams on Admission: No results found.  EKG: Not performed.   Assessment/Plan  1. Wound complication  - Pt underwent TAVR in September 2018 and was taken back to OR with wound complications  - She has been managed with wound vac and HH RN has been doing dressing changes MWF  - HH RN noted increased bleeding, increased drainage, and increasingly foul odor from the wound today  - No systemic infectious s/s  - Vascular surgery planning for debridement on 11/8  - Plan to continue IV abx, keep NPO after midnight   2. Hyperglycemia  - Hx of DM documented in chart, noted to have A1c of only 5.8% in September '18 and not currently on diabetes medications  - Serum glucose is 276 on admission, possibly driven by infection  - Plan to check CBG's and start a SSI with Novolog   3. CAD  - No anginal complaints - Can likely resume ASA and Plavix after debridement  - Continue Crestor    4. CKD stage III  - SCr is 1.28 on admission, better than priors   - Renally-dose medications as needed, avoid nephrotoxins where possible    5. Chronic diastolic CHF  - Appears well-compensated on admission  - Continue Lasix 40  mg qD, SLIV, follow daily wt and I/O's   6. Breast cancer - Status-post lumpectomy with clear margins, radiation  - Continue tamoxifen   7. Anemia  -  Hgb is 8.2 on admission, down from 9.4 one month earlier  - Increased bleeding reported from surgical wound - Type and screen, repeat CBC in am     DVT prophylaxis: SCD's Code Status: Full  Family Communication: Discussed with patient Disposition Plan: Admit to med-surg Consults called: Vascular surgery Admission status: Inpatient    Vianne Bulls, MD [facility] Pager [phone]  If 7PM-7AM, please contact night-coverage [email or web address] Password [facility]  04/03/2017, 11:27 PM

## 2017-04-03 NOTE — ED Notes (Signed)
Wound cleaned with sterile gauze. Dry sterile gauze applied. Will continue to monitor.

## 2017-04-03 NOTE — ED Triage Notes (Signed)
Pt BIB [facility] for a wound check. Pt had a valve replacement in sept. The wound is a complication of that. Has a home health nurse that changes the bandage MWF. Home health sent her here for worsening appearance of wound. Normally has a wound vac intact

## 2017-04-03 NOTE — ED Notes (Signed)
The pt had a repair aaa sept 6th  An artery was clipped and she began to bleed.  She arrested x 2 on the table  She had numerus units of blood.  She still has a home health nurse that comes out to do her bandaging.  Today the home health nurse came out and saw more blood clots than usual at present no active bleeding.  Her lt leg is more swollen than the rt  Skin temp the same  The pedal pulse is weaker than the right.  The rt pedal pulse is bounding.  The pt does not think that the lt leg was swollen yesterday.  Alert oriented skin warm and dry

## 2017-04-03 NOTE — Progress Notes (Signed)
Pharmacy Antibiotic Note  Carrie Monroe is a 81 y.o. female admitted on 04/03/2017 with wound infection.  Pharmacy has been consulted for Zosyn dosing.  Plan: Zosyn 3.375g IV q8h (4 hour infusion).  Height: 5\' 3"  (160 cm) Weight: 240 lb (108.9 kg) IBW/kg (Calculated) : 52.4  Temp (24hrs), Avg:98.1 F (36.7 C), Min:98.1 F (36.7 C), Max:98.1 F (36.7 C)  Recent Labs  Lab 04/03/17 1923  WBC 6.3  CREATININE 1.28*    Estimated Creatinine Clearance: 42.2 mL/min (A) (by C-G formula based on SCr of 1.28 mg/dL (H)).    No Known Allergies   Thank you for allowing pharmacy to be a part of this patient's care.  Wynona Neat, PharmD, BCPS  04/03/2017 11:34 PM

## 2017-04-04 ENCOUNTER — Other Ambulatory Visit: Payer: Self-pay

## 2017-04-04 ENCOUNTER — Encounter (HOSPITAL_COMMUNITY)
Admission: EM | Disposition: A | Discharge status: Home Health | Payer: Self-pay | Source: Home / Self Care | Attending: Family Medicine

## 2017-04-04 ENCOUNTER — Encounter (HOSPITAL_COMMUNITY): Payer: Self-pay | Admitting: Certified Registered Nurse Anesthetist

## 2017-04-04 ENCOUNTER — Inpatient Hospital Stay (HOSPITAL_COMMUNITY): Payer: Medicare Other | Admitting: Certified Registered Nurse Anesthetist

## 2017-04-04 DIAGNOSIS — T148XXA Other injury of unspecified body region, initial encounter: Secondary | ICD-10-CM

## 2017-04-04 DIAGNOSIS — I9789 Other postprocedural complications and disorders of the circulatory system, not elsewhere classified: Secondary | ICD-10-CM

## 2017-04-04 DIAGNOSIS — I5032 Chronic diastolic (congestive) heart failure: Secondary | ICD-10-CM

## 2017-04-04 DIAGNOSIS — I10 Essential (primary) hypertension: Secondary | ICD-10-CM

## 2017-04-04 DIAGNOSIS — L089 Local infection of the skin and subcutaneous tissue, unspecified: Secondary | ICD-10-CM

## 2017-04-04 DIAGNOSIS — N183 Chronic kidney disease, stage 3 (moderate): Secondary | ICD-10-CM

## 2017-04-04 DIAGNOSIS — I2510 Atherosclerotic heart disease of native coronary artery without angina pectoris: Secondary | ICD-10-CM

## 2017-04-04 DIAGNOSIS — Z952 Presence of prosthetic heart valve: Secondary | ICD-10-CM

## 2017-04-04 DIAGNOSIS — C50412 Malignant neoplasm of upper-outer quadrant of left female breast: Secondary | ICD-10-CM

## 2017-04-04 DIAGNOSIS — Z170 Estrogen receptor positive status [ER+]: Secondary | ICD-10-CM

## 2017-04-04 HISTORY — PX: I & D EXTREMITY: SHX5045

## 2017-04-04 HISTORY — PX: APPLICATION OF WOUND VAC: SHX5189

## 2017-04-04 LAB — GLUCOSE, CAPILLARY
GLUCOSE-CAPILLARY: 139 mg/dL — AB (ref 65–99)
GLUCOSE-CAPILLARY: 298 mg/dL — AB (ref 65–99)
Glucose-Capillary: 126 mg/dL — ABNORMAL HIGH (ref 65–99)
Glucose-Capillary: 131 mg/dL — ABNORMAL HIGH (ref 65–99)
Glucose-Capillary: 145 mg/dL — ABNORMAL HIGH (ref 65–99)
Glucose-Capillary: 210 mg/dL — ABNORMAL HIGH (ref 65–99)
Glucose-Capillary: 259 mg/dL — ABNORMAL HIGH (ref 65–99)
Glucose-Capillary: 299 mg/dL — ABNORMAL HIGH (ref 65–99)

## 2017-04-04 LAB — CBC WITH DIFFERENTIAL/PLATELET
Basophils Absolute: 0 10*3/uL (ref 0.0–0.1)
Basophils Relative: 0 %
EOS PCT: 4 %
Eosinophils Absolute: 0.3 10*3/uL (ref 0.0–0.7)
HEMATOCRIT: 23.4 % — AB (ref 36.0–46.0)
HEMOGLOBIN: 7.4 g/dL — AB (ref 12.0–15.0)
LYMPHS PCT: 18 %
Lymphs Abs: 1.2 10*3/uL (ref 0.7–4.0)
MCH: 30.2 pg (ref 26.0–34.0)
MCHC: 31.6 g/dL (ref 30.0–36.0)
MCV: 95.5 fL (ref 78.0–100.0)
Monocytes Absolute: 0.5 10*3/uL (ref 0.1–1.0)
Monocytes Relative: 8 %
Neutro Abs: 4.7 10*3/uL (ref 1.7–7.7)
Neutrophils Relative %: 70 %
PLATELETS: 118 10*3/uL — AB (ref 150–400)
RBC: 2.45 MIL/uL — AB (ref 3.87–5.11)
RDW: 20.6 % — ABNORMAL HIGH (ref 11.5–15.5)
WBC: 6.7 10*3/uL (ref 4.0–10.5)

## 2017-04-04 LAB — IRON AND TIBC
Iron: 35 ug/dL (ref 28–170)
SATURATION RATIOS: 14 % (ref 10.4–31.8)
TIBC: 255 ug/dL (ref 250–450)
UIBC: 220 ug/dL

## 2017-04-04 LAB — SURGICAL PCR SCREEN
MRSA, PCR: NEGATIVE
Staphylococcus aureus: NEGATIVE

## 2017-04-04 LAB — VITAMIN B12: VITAMIN B 12: 700 pg/mL (ref 180–914)

## 2017-04-04 LAB — FERRITIN: Ferritin: 635 ng/mL — ABNORMAL HIGH (ref 11–307)

## 2017-04-04 LAB — BASIC METABOLIC PANEL
Anion gap: 7 (ref 5–15)
BUN: 20 mg/dL (ref 6–20)
CHLORIDE: 106 mmol/L (ref 101–111)
CO2: 24 mmol/L (ref 22–32)
Calcium: 8.4 mg/dL — ABNORMAL LOW (ref 8.9–10.3)
Creatinine, Ser: 1.22 mg/dL — ABNORMAL HIGH (ref 0.44–1.00)
GFR calc Af Amer: 48 mL/min — ABNORMAL LOW (ref >60)
GFR calc non Af Amer: 41 mL/min — ABNORMAL LOW (ref >60)
GLUCOSE: 144 mg/dL — AB (ref 65–99)
POTASSIUM: 3.1 mmol/L — AB (ref 3.5–5.1)
Sodium: 137 mmol/L (ref 135–145)

## 2017-04-04 LAB — RETICULOCYTES
RBC.: 2.79 MIL/uL — AB (ref 3.87–5.11)
Retic Count, Absolute: 106 10*3/uL (ref 19.0–186.0)
Retic Ct Pct: 3.8 % — ABNORMAL HIGH (ref 0.4–3.1)

## 2017-04-04 LAB — FOLATE: FOLATE: 16.4 ng/mL (ref >5.9)

## 2017-04-04 LAB — PREPARE RBC (CROSSMATCH)

## 2017-04-04 SURGERY — IRRIGATION AND DEBRIDEMENT EXTREMITY
Anesthesia: General | Site: Groin | Laterality: Left

## 2017-04-04 MED ORDER — LACTATED RINGERS IV SOLN
INTRAVENOUS | Status: DC
  Administered 2017-04-04 – 2017-04-05 (×2): via INTRAVENOUS

## 2017-04-04 MED ORDER — DEXAMETHASONE SODIUM PHOSPHATE 10 MG/ML IJ SOLN
INTRAMUSCULAR | Status: DC | PRN
  Administered 2017-04-04: 5 mg via INTRAVENOUS

## 2017-04-04 MED ORDER — HYDROMORPHONE HCL 1 MG/ML IJ SOLN: 0.2500 mg | INTRAMUSCULAR | Status: DC | PRN

## 2017-04-04 MED ORDER — LIDOCAINE-EPINEPHRINE (PF) 1 %-1:200000 IJ SOLN
INTRAMUSCULAR | Status: AC
  Filled 2017-04-04: qty 30

## 2017-04-04 MED ORDER — ONDANSETRON HCL 4 MG/2ML IJ SOLN
INTRAMUSCULAR | Status: AC
  Filled 2017-04-04: qty 2

## 2017-04-04 MED ORDER — FENTANYL CITRATE (PF) 250 MCG/5ML IJ SOLN
INTRAMUSCULAR | Status: AC
  Filled 2017-04-04: qty 5

## 2017-04-04 MED ORDER — DEXAMETHASONE SODIUM PHOSPHATE 10 MG/ML IJ SOLN
INTRAMUSCULAR | Status: AC
  Filled 2017-04-04: qty 2

## 2017-04-04 MED ORDER — PROTAMINE SULFATE 10 MG/ML IV SOLN
INTRAVENOUS | Status: AC
  Filled 2017-04-04: qty 10

## 2017-04-04 MED ORDER — PROPOFOL 10 MG/ML IV BOLUS
INTRAVENOUS | Status: DC | PRN
  Administered 2017-04-04 (×2): 10 mg via INTRAVENOUS
  Administered 2017-04-04: 15 mg via INTRAVENOUS

## 2017-04-04 MED ORDER — SODIUM CHLORIDE 0.9 % IV SOLN
Freq: Once | INTRAVENOUS | Status: AC
  Administered 2017-04-04: 19:00:00 via INTRAVENOUS

## 2017-04-04 MED ORDER — CEFAZOLIN SODIUM-DEXTROSE 2-3 GM-%(50ML) IV SOLR
INTRAVENOUS | Status: DC | PRN
  Administered 2017-04-04: 2 g via INTRAVENOUS

## 2017-04-04 MED ORDER — PROPOFOL 500 MG/50ML IV EMUL
INTRAVENOUS | Status: DC | PRN
  Administered 2017-04-04: 100 ug/kg/min via INTRAVENOUS

## 2017-04-04 MED ORDER — LIDOCAINE HCL (PF) 1 % IJ SOLN
INTRAMUSCULAR | Status: AC
  Filled 2017-04-04: qty 30

## 2017-04-04 MED ORDER — ONDANSETRON HCL 4 MG/2ML IJ SOLN
INTRAMUSCULAR | Status: DC | PRN
Start: 2017-04-04 — End: 2017-04-04
  Administered 2017-04-04: 4 mg via INTRAVENOUS

## 2017-04-04 MED ORDER — OXYCODONE HCL 5 MG/5ML PO SOLN: 5.0000 mg | Freq: Once | ORAL | Status: DC | PRN

## 2017-04-04 MED ORDER — EPHEDRINE SULFATE 50 MG/ML IJ SOLN
INTRAMUSCULAR | Status: DC | PRN
  Administered 2017-04-04 (×2): 10 mg via INTRAVENOUS

## 2017-04-04 MED ORDER — OXYCODONE HCL 5 MG PO TABS: 5.0000 mg | ORAL_TABLET | Freq: Once | ORAL | Status: DC | PRN

## 2017-04-04 MED ORDER — FENTANYL CITRATE (PF) 100 MCG/2ML IJ SOLN
INTRAMUSCULAR | Status: DC | PRN
  Administered 2017-04-04: 50 ug via INTRAVENOUS

## 2017-04-04 MED ORDER — PROPOFOL 10 MG/ML IV BOLUS
INTRAVENOUS | Status: AC
  Filled 2017-04-04: qty 20

## 2017-04-04 MED ORDER — LIDOCAINE HCL (CARDIAC) 20 MG/ML IV SOLN
INTRAVENOUS | Status: DC | PRN
  Administered 2017-04-04: 80 mg via INTRAVENOUS

## 2017-04-04 MED ORDER — LIDOCAINE 2% (20 MG/ML) 5 ML SYRINGE
INTRAMUSCULAR | Status: AC
  Filled 2017-04-04: qty 15

## 2017-04-04 MED ORDER — HEPARIN SODIUM (PORCINE) 1000 UNIT/ML IJ SOLN
INTRAMUSCULAR | Status: AC
  Filled 2017-04-04: qty 2

## 2017-04-04 MED ORDER — CEFAZOLIN SODIUM 1 G IJ SOLR
INTRAMUSCULAR | Status: AC
  Filled 2017-04-04: qty 20

## 2017-04-04 MED ORDER — SODIUM CHLORIDE 0.9 % IR SOLN
Status: DC | PRN
  Administered 2017-04-04: 3000 mL

## 2017-04-04 MED ORDER — EPHEDRINE 5 MG/ML INJ
INTRAVENOUS | Status: AC
  Filled 2017-04-04: qty 20

## 2017-04-04 SURGICAL SUPPLY — 42 items
BANDAGE ACE 4X5 VEL STRL LF (GAUZE/BANDAGES/DRESSINGS) IMPLANT
BANDAGE ACE 6X5 VEL STRL LF (GAUZE/BANDAGES/DRESSINGS) IMPLANT
BNDG GAUZE ELAST 4 BULKY (GAUZE/BANDAGES/DRESSINGS) IMPLANT
CANISTER SUCT 3000ML PPV (MISCELLANEOUS) ×2 IMPLANT
CLIP VESOCCLUDE MED 6/CT (CLIP) ×1 IMPLANT
CLIP VESOCCLUDE SM WIDE 6/CT (CLIP) ×1 IMPLANT
COVER SURGICAL LIGHT HANDLE (MISCELLANEOUS) ×2 IMPLANT
DRAPE EXTREMITY BILATERAL (DRAPES) IMPLANT
DRAPE HALF SHEET 40X57 (DRAPES) ×1 IMPLANT
DRAPE U-SHAPE 76X120 STRL (DRAPES) IMPLANT
DRSG VAC ATS MED SENSATRAC (GAUZE/BANDAGES/DRESSINGS) ×1 IMPLANT
ELECT REM PT RETURN 9FT ADLT (ELECTROSURGICAL) ×2
ELECTRODE REM PT RTRN 9FT ADLT (ELECTROSURGICAL) ×1 IMPLANT
FLUID NSS /IRRIG 3000 ML XXX (IV SOLUTION) ×1 IMPLANT
GAUZE SPONGE 4X4 12PLY STRL (GAUZE/BANDAGES/DRESSINGS) ×1 IMPLANT
GAUZE XEROFORM 5X9 LF (GAUZE/BANDAGES/DRESSINGS) IMPLANT
GLOVE BIOGEL PI IND STRL 7.5 (GLOVE) ×1 IMPLANT
GLOVE BIOGEL PI INDICATOR 7.5 (GLOVE) ×1
GLOVE SURG SS PI 7.5 STRL IVOR (GLOVE) ×2 IMPLANT
GOWN STRL REUS W/ TWL LRG LVL3 (GOWN DISPOSABLE) ×2 IMPLANT
GOWN STRL REUS W/ TWL XL LVL3 (GOWN DISPOSABLE) ×1 IMPLANT
GOWN STRL REUS W/TWL LRG LVL3 (GOWN DISPOSABLE) ×2
GOWN STRL REUS W/TWL XL LVL3 (GOWN DISPOSABLE) ×2
HANDPIECE INTERPULSE COAX TIP (DISPOSABLE) ×2
KIT BASIN OR (CUSTOM PROCEDURE TRAY) ×2 IMPLANT
KIT ROOM TURNOVER OR (KITS) ×2 IMPLANT
NDL HYPO 25GX1X1/2 BEV (NEEDLE) IMPLANT
NEEDLE HYPO 25GX1X1/2 BEV (NEEDLE) ×2 IMPLANT
NS IRRIG 1000ML POUR BTL (IV SOLUTION) ×1 IMPLANT
PACK CV ACCESS (CUSTOM PROCEDURE TRAY) IMPLANT
PACK GENERAL/GYN (CUSTOM PROCEDURE TRAY) ×1 IMPLANT
PACK UNIVERSAL I (CUSTOM PROCEDURE TRAY) IMPLANT
PAD ARMBOARD 7.5X6 YLW CONV (MISCELLANEOUS) ×4 IMPLANT
SET HNDPC FAN SPRY TIP SCT (DISPOSABLE) IMPLANT
SUT ETHILON 3 0 PS 1 (SUTURE) IMPLANT
SUT VIC AB 2-0 CTX 36 (SUTURE) IMPLANT
SUT VIC AB 3-0 SH 27 (SUTURE) ×2
SUT VIC AB 3-0 SH 27X BRD (SUTURE) IMPLANT
SUT VICRYL 4-0 PS2 18IN ABS (SUTURE) IMPLANT
SYR CONTROL 10ML LL (SYRINGE) ×1 IMPLANT
WATER STERILE IRR 1000ML POUR (IV SOLUTION) ×2 IMPLANT
WND VAC CANISTER 500ML (MISCELLANEOUS) ×1 IMPLANT

## 2017-04-04 NOTE — Progress Notes (Signed)
[facility]   Patient admitted after midnight see H&P for further details.   Patient seen, chart reviewed. Patient admitted with ? Wound infection, and vascular surgery was consulted for possible debridement. Patient also found to have low Hgb down to 7.4. From 9.4 1 month ago. Patient report she is supposed to be on iron pills, but has not started yet. Will add Iron panel, pt is mildly symptomatic but hemodynamically stable, will evaluate after surgery if continues with symptoms will transfuse 1 unit. Ok to proceed with surgery. Will continue to monitor.   Rest per H&P   Chipper Oman, MD

## 2017-04-04 NOTE — Anesthesia Preprocedure Evaluation (Addendum)
Anesthesia Evaluation  Patient identified by MRN, date of birth, ID band  Reviewed: Allergy & Precautions, NPO status   Airway Mallampati: II  TM Distance: >3 FB Neck ROM: Full    Dental  (+) Missing, Poor Dentition   Pulmonary neg pulmonary ROS,    breath sounds clear to auscultation       Cardiovascular hypertension, + CAD, + Peripheral Vascular Disease and +CHF  + Valvular Problems/Murmurs AS  Rhythm:Regular Rate:Normal     Neuro/Psych negative neurological ROS     GI/Hepatic   Endo/Other  diabetesMorbid obesity  Renal/GU Renal InsufficiencyRenal disease     Musculoskeletal  (+) Arthritis ,   Abdominal (+) + obese,   Peds  Hematology   Anesthesia Other Findings   Reproductive/Obstetrics                            Anesthesia Physical Anesthesia Plan  ASA: III  Anesthesia Plan: General and MAC   Post-op Pain Management:    Induction: Intravenous  PONV Risk Score and Plan: 3 and Dexamethasone, Ondansetron and Treatment may vary due to age or medical condition  Airway Management Planned: Natural Airway and Simple Face Mask  Additional Equipment:   Intra-op Plan:   Post-operative Plan:   Informed Consent: I have reviewed the patients History and Physical, chart, labs and discussed the procedure including the risks, benefits and alternatives for the proposed anesthesia with the patient or authorized representative who has indicated his/her understanding and acceptance.   Dental advisory given  Plan Discussed with: CRNA  Anesthesia Plan Comments:        Anesthesia Quick Evaluation

## 2017-04-04 NOTE — Transfer of Care (Signed)
Immediate Anesthesia Transfer of Care Note  Patient: Carrie Monroe  Procedure(s) Performed: IRRIGATION AND DEBRIDEMENT LEFT GROIN (Left Groin) APPLICATION OF WOUND VAC (Left Groin)  Patient Location: PACU  Anesthesia Type:MAC  Level of Consciousness: awake, alert , oriented and patient cooperative  Airway & Oxygen Therapy: Patient Spontanous Breathing  Post-op Assessment: Report given to RN and Post -op Vital signs reviewed and stable  Post vital signs: Reviewed and stable  Last Vitals:  Vitals:   04/04/17 0446 04/04/17 1213  BP: (!) 140/49   Pulse: 79   Resp: 18   Temp: 37 C (P) 36.6 C  SpO2: 96%     Last Pain:  Vitals:   04/04/17 0446  TempSrc: Oral  PainSc:          Complications: No apparent anesthesia complications

## 2017-04-04 NOTE — Op Note (Signed)
    Patient name: Carrie Monroe MRN: [id number] DOB: 06/21/1935 Sex: female  04/04/2017 Pre-operative Diagnosis: Infected left groin Post-operative diagnosis:  Same Surgeon:  [name] Assistants: None Procedure:   I&D left groin, #2 placement of wound VAC Anesthesia: MAC Blood Loss:  See anesthesia record Specimens: None  Findings: There was a small tunnel going into the retroperitoneum on the lateral aspect of the wound.  The remaining portion of the incision looked very healthy  Indications: The patient has previously undergone repair of bleeding from sheath removal during TAVR.  She had a significant retroperitoneal hematoma.  She was taken back to the operating room multiple times and was ultimately discharged with a wound VAC.  She presented to the emergency department yesterday with a foul-smelling odor from the incision as well as erythema.  She is here today for operative exploration.  Procedure:  The patient was identified in the holding area and taken to [place] [address]  The patient was then placed supine on the table. MAC anesthesia was administered.  The patient was prepped and draped in the usual sterile fashion.  A time out was called and antibiotics were administered.  I explored the wound.  I probed multiple areas.  There were no identifiable pockets with the exception of the most lateral aspect of the incision.  I bluntly probed this area and was able to enter a space 30 cc of lymphatic fluid was evacuated.  I then used the Pulsavac and irrigated the entire wound with 3 L of fluid.  I then used a #10 blade to scrape off the top layer of the healing tissue.  All tissue appeared viable.  There was no purulence.  I then achieved hemostasis.  There was one area of bleeding in the middle of the wound that required Bovie cautery.  I then selected a medium wound VAC and was able to talk a tongue of the wound VAC into the retroperitoneal space.  The wound VAC was then secured over  the wound and connected to suction with a good seal.   Disposition: To PACU stable   V. [name], M.D. [facility] Office: [phone] Pager:  [phone]

## 2017-04-04 NOTE — Care Management Note (Signed)
Case Management Note  Patient Details  Name: Carrie Monroe MRN: [id number] Date of Birth: [birth year]  Subjective/Objective:                    Action/Plan: Patient from home with [facility] .   Patient will need resumption of care home health orders before discharge.   Patient has home VAC at bedside. Adacia with [facility] aware patient has been admitted and is following.  Expected Discharge Date:  04/08/17               Expected Discharge Plan:  [place]  In-House Referral:     Discharge planning Services  CM Consult  Post Acute Care Choice:    Choice offered to:  Patient  DME Arranged:    DME Agency:     HH Arranged:  RN [place] Agency:  [facility]  Status of Service:  Completed, signed off  If discussed at [place] of Stay Meetings, dates discussed:    Additional Comments:  Marilu Favre, RN 04/04/2017, 4:02 PM

## 2017-04-04 NOTE — Anesthesia Postprocedure Evaluation (Signed)
Anesthesia Post Note  Patient: Carrie Monroe  Procedure(s) Performed: IRRIGATION AND DEBRIDEMENT LEFT GROIN (Left Groin) APPLICATION OF WOUND VAC (Left Groin)     Patient location during evaluation: PACU Anesthesia Type: MAC Level of consciousness: awake and alert Pain management: pain level controlled Vital Signs Assessment: post-procedure vital signs reviewed and stable Respiratory status: spontaneous breathing, nonlabored ventilation, respiratory function stable and patient connected to nasal cannula oxygen Cardiovascular status: stable and blood pressure returned to baseline Postop Assessment: no apparent nausea or vomiting Anesthetic complications: no    Last Vitals:  Vitals:   04/04/17 1213 04/04/17 1226  BP: (!) 143/58   Pulse: 84 83  Resp: 14 15  Temp: 36.6 C 36.7 C  SpO2: 97% 99%    Last Pain:  Vitals:   04/04/17 1226  TempSrc:   PainSc: 0-No pain                 Ersel Enslin,JAMES TERRILL

## 2017-04-04 NOTE — Progress Notes (Signed)
Received from PACU @ around 1500hr via bed. Alert, oriented. Wound vac left groin in situ draining to serosanguinous output. SCD lower legs situ. Denied pain. Voided freely on the bedpan

## 2017-04-04 NOTE — Progress Notes (Signed)
    Subjective  -   Patient admitted from the emergency department last night secondary to a foul odor and redness around her wound in the left groin.   Physical Exam:  There is healthy pink granulation tissue within the wound.  There does appear to be a light layer of biofilm and a foul odor.  No significant erythema around the skin was observed.       Assessment/Plan:    I discussed taking the patient to the operating room for irrigation and cleanout of her wound to make sure there are no cavities.  She should be able to go home following the procedure with approximately 5 days worth of antibiotics.  Carrie Monroe 04/04/2017 10:51 AM --  Vitals:   04/04/17 0032 04/04/17 0446  BP: (!) 154/56 (!) 140/49  Pulse: 83 79  Resp: 16 18  Temp: 98.9 F (37.2 C) 98.6 F (37 C)  SpO2: 98% 96%    Intake/Output Summary (Last 24 hours) at 04/04/2017 1051 Last data filed at 04/04/2017 0925 Gross per 24 hour  Intake 50 ml  Output -  Net 50 ml     Laboratory CBC    Component Value Date/Time   WBC 6.7 04/04/2017 0206   HGB 7.4 (L) 04/04/2017 0206   HGB 9.4 (L) 03/06/2017 1657   HGB 11.2 (L) 02/23/2015 1029   HCT 23.4 (L) 04/04/2017 0206   HCT 28.3 (L) 03/06/2017 1657   HCT 33.6 (L) 02/23/2015 1029   PLT 118 (L) 04/04/2017 0206   PLT 294 03/06/2017 1657    BMET    Component Value Date/Time   NA 137 04/04/2017 0206   NA 136 03/06/2017 1657   NA 141 02/23/2015 1029   K 3.1 (L) 04/04/2017 0206   K 4.3 02/23/2015 1029   CL 106 04/04/2017 0206   CL 111 (H) 08/28/2012 1224   CO2 24 04/04/2017 0206   CO2 26 02/23/2015 1029   GLUCOSE 144 (H) 04/04/2017 0206   GLUCOSE 193 (H) 02/23/2015 1029   GLUCOSE 103 (H) 08/28/2012 1224   BUN 20 04/04/2017 0206   BUN 29 (H) 03/06/2017 1657   BUN 20.2 02/23/2015 1029   CREATININE 1.22 (H) 04/04/2017 0206   CREATININE 1.3 (H) 02/23/2015 1029   CALCIUM 8.4 (L) 04/04/2017 0206   CALCIUM 9.5 02/23/2015 1029   GFRNONAA 41 (L)  04/04/2017 0206   GFRAA 48 (L) 04/04/2017 0206    COAG Lab Results  Component Value Date   INR 1.15 02/22/2017   INR 1.12 02/08/2017   INR 1.38 02/05/2017   No results found for: PTT  Antibiotics Anti-infectives (From admission, onward)   Start     Dose/Rate Route Frequency Ordered Stop   04/04/17 0400  [MAR Hold]  piperacillin-tazobactam (ZOSYN) IVPB 3.375 g     (MAR Hold since 04/04/17 1008)   3.375 g 12.5 mL/hr over 240 Minutes Intravenous Every 8 hours 04/03/17 2335     04/03/17 2215  piperacillin-tazobactam (ZOSYN) IVPB 3.375 g     3.375 g 100 mL/hr over 30 Minutes Intravenous  Once 04/03/17 2211 04/03/17 2318       V. Leia Alf, M.D. [facility] [place] Office: [phone] Pager:  [phone]

## 2017-04-04 NOTE — Anesthesia Procedure Notes (Signed)
Procedure Name: MAC Date/Time: 04/04/2017 11:19 AM Performed by: White, Amedeo Plenty, CRNA Pre-anesthesia Checklist: Patient identified, Emergency Drugs available, Suction available and Patient being monitored Patient Re-evaluated:Patient Re-evaluated prior to induction Oxygen Delivery Method: Simple face mask

## 2017-04-04 NOTE — Progress Notes (Signed)
The patient is status post I&D of the left groin incision.  I did identify a small pocket on the lateral aspect of the wound.  I was able to talk a piece of the wound VAC sponge into this area and replace her wound VAC.  This was all done under MAC anesthesia.  The patient should be stable for discharge later this afternoon.  I would have her discharged on 5 days of oral antibiotics.  She already has a follow-up clinic appointment with me.  [name]

## 2017-04-05 ENCOUNTER — Encounter (HOSPITAL_COMMUNITY): Payer: Self-pay | Admitting: Surgery

## 2017-04-05 LAB — BPAM RBC
BLOOD PRODUCT EXPIRATION DATE: 201811232359
ISSUE DATE / TIME: 201811081545
UNIT TYPE AND RH: 6200

## 2017-04-05 LAB — GLUCOSE, CAPILLARY
GLUCOSE-CAPILLARY: 190 mg/dL — AB (ref 65–99)
GLUCOSE-CAPILLARY: 174 mg/dL — AB (ref 65–99)
Glucose-Capillary: 169 mg/dL — ABNORMAL HIGH (ref 65–99)

## 2017-04-05 LAB — TYPE AND SCREEN
ABO/RH(D): A POS
Antibody Screen: NEGATIVE
Unit division: 0

## 2017-04-05 LAB — CBC
HCT: 23.9 % — ABNORMAL LOW (ref 36.0–46.0)
Hemoglobin: 7.7 g/dL — ABNORMAL LOW (ref 12.0–15.0)
MCH: 30.4 pg (ref 26.0–34.0)
MCHC: 32.2 g/dL (ref 30.0–36.0)
MCV: 94.5 fL (ref 78.0–100.0)
PLATELETS: UNDETERMINED 10*3/uL (ref 150–400)
RBC: 2.53 MIL/uL — AB (ref 3.87–5.11)
RDW: 20.1 % — AB (ref 11.5–15.5)
WBC: 6.2 10*3/uL (ref 4.0–10.5)

## 2017-04-05 MED ORDER — AMOXICILLIN-POT CLAVULANATE 875-125 MG PO TABS
1.0000 | ORAL_TABLET | Freq: Two times a day (BID) | ORAL | Status: DC
  Administered 2017-04-05: 1 via ORAL
  Filled 2017-04-05: qty 1

## 2017-04-05 MED ORDER — FERROUS SULFATE 325 (65 FE) MG PO TABS: 325.0000 mg | ORAL_TABLET | Freq: Three times a day (TID) | ORAL | 0 refills | Status: DC

## 2017-04-05 MED ORDER — AMOXICILLIN-POT CLAVULANATE 875-125 MG PO TABS: 1.0000 | ORAL_TABLET | Freq: Two times a day (BID) | ORAL | 0 refills | Status: AC

## 2017-04-05 NOTE — Care Management (Signed)
Confirmed with Adacia with [facility] can draw CBC on Monday , April 08, 2017 and call results to Dr Marton Redwood . Patient aware and confirmed Dr Marton Redwood is PCP.   Magdalen Spatz RN BSN [phone]

## 2017-04-05 NOTE — Discharge Summary (Signed)
Physician Discharge Summary  Carrie Monroe  EUM:[id number]  DOB: 26-Nov-1935  DOA: 04/03/2017 PCP: Marton Redwood, MD  Admit date: 04/03/2017 Discharge date: 04/05/2017  Admitted From: Home  Disposition:  Home   Recommendations for Outpatient Follow-up:  1. Follow up with PCP in 1 weeks 2. Please obtain BMP/CBC in 3-5 days to monitor renal function and Hgb   Home Health: PT/RN  Equipment/Devices: Wound VAC   Discharge Condition: Stable  CODE STATUS: Full code  Diet recommendation: Heart Healthy / Carb Modified   Brief/Interim Summary: Carrie Monroe is a 81 year old history significant for breast cancer status post radiation, chronic diastolic CHF, chronic kidney disease stage III, CAD, TAVR September with wound complication who presented to the emergency department after being referred by home health RN due to increased in blood clots and foul odor from her left groin wound.  Patient is on antibiotic beads and wound VAC.  Vascular surgery was consulted in the ED who recommended admission for debridement.  Upon ED evaluation she was found to be anemic with hemoglobin of 8.2, on repeat 7.4.  Patient is status post I&D of left groin incision, wound VAC was replaced and recommended oral antibiotics for 5 days.  Patient was given 1 unit of PRBCs.  Patient has clinically improved and is hemodynamically stable.  Will discharge home with follow-up with PCP.   Subjective: Patient seen and examined discharge, she reported feeling much improved, more energetic and breathing better.  Heart: Heart has improved as well.  No acute events overnight, she remains afebrile, denies any pain from her left groin.  Wound VAC with serosanguineous material.  Discharge Diagnoses/Hospital Course:  Wound complication Patient underwent TAVR, in September 5400 has had complications with wound where it had to be debrided and be managed with wound VAC at home.  Patient is status post I&D and replacement of the  wound VAC.  Patient did not have any signs of systemic infection.  Per vascular surgery recommending to complete a course of antibiotics for 5 days after I&D.  She is to follow-up with vascular surgery in 2 weeks.   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 Status post 1 unit of PRBCs Iron and ferritin with good levels.  Reticulocyte count appropriate Continue iron supplement at home CBC in the next 3-5 days to monitor hemoglobin  Chronic kidney disease stage III Creatinine at baseline Follow-up as an outpatient  Chronic diastolic CHF No signs of fluid overload, stable Continue home medications without any changes  All other chronic medical condition were stable during the hospitalization.  On the day of the discharge the patient's vitals were stable, and no other acute medical condition were reported by patient. the patient was felt safe to be discharge to home  Discharge Instructions  You were cared for by a hospitalist during your hospital stay. If you have any questions about your discharge medications or the care you received while you were in the hospital after you are discharged, you can call the unit and asked to speak with the hospitalist on call if the hospitalist that took care of you is not available. Once you are discharged, your primary care physician will handle any further medical issues. Please note that NO REFILLS for any discharge medications will be authorized once you are discharged, as it is imperative that you return to your primary care physician (or establish a relationship with a primary care physician if you do not have one) for your aftercare needs so that  they can reassess your need for medications and monitor your lab values.  Discharge Instructions    Call MD for:  difficulty breathing, headache or visual disturbances   Complete by:  As directed    Call MD for:  extreme fatigue   Complete by:  As directed    Call MD for:  hives   Complete by:   As directed    Call MD for:  persistant dizziness or light-headedness   Complete by:  As directed    Call MD for:  persistant nausea and vomiting   Complete by:  As directed    Call MD for:  redness, tenderness, or signs of infection (pain, swelling, redness, odor or green/yellow discharge around incision site)   Complete by:  As directed    Call MD for:  severe uncontrolled pain   Complete by:  As directed    Call MD for:  temperature >100.4   Complete by:  As directed    Diet - low sodium heart healthy   Complete by:  As directed    Increase activity slowly   Complete by:  As directed      Allergies as of 04/05/2017   No Known Allergies     Medication List    TAKE these medications   allopurinol 300 MG tablet Commonly known as:  ZYLOPRIM Take 300 mg by mouth daily as needed (gout).   amoxicillin-clavulanate 875-125 MG tablet Commonly known as:  AUGMENTIN Take 1 tablet 2 (two) times daily for 4 days by mouth.   aspirin 81 MG chewable tablet Chew 1 tablet (81 mg total) by mouth daily.   clopidogrel 75 MG tablet Commonly known as:  PLAVIX Take 1 tablet (75 mg total) by mouth daily with breakfast.   colchicine 0.6 MG tablet Take 0.6 mg by mouth every 8 (eight) hours as needed (gout flare).   COLLAGEN PO Take by mouth. power   ferrous sulfate 325 (65 FE) MG tablet Commonly known as:  FERROUSUL Take 1 tablet (325 mg total) 3 (three) times daily with meals by mouth.   FIBER 7 PO Take by mouth. Hemp power   furosemide 40 MG tablet Commonly known as:  LASIX Take 40 mg by mouth daily.   hydrALAZINE 50 MG tablet Commonly known as:  APRESOLINE Take 50 mg 2 (two) times daily by mouth.   metoprolol tartrate 25 MG tablet Commonly known as:  LOPRESSOR Take 1 tablet (25 mg total) by mouth 2 (two) times daily.   oxyCODONE-acetaminophen 5-325 MG tablet Commonly known as:  PERCOCET/ROXICET Take 1-2 tablets by mouth every 4 (four) hours as needed for moderate pain.    rosuvastatin 10 MG tablet Commonly known as:  CRESTOR Take 10 mg by mouth daily.   tamoxifen 20 MG tablet Commonly known as:  NOLVADEX Take 1 tablet by mouth every morning.   vitamin B-12 100 MCG tablet Commonly known as:  CYANOCOBALAMIN Take 100 mcg daily by mouth.   VITAMIN D PO Take 1 capsule by mouth daily.   zinc sulfate 220 (50 Zn) MG capsule Take 220 mg daily by mouth.            Durable Medical Equipment  (From admission, onward)        Start     Ordered   04/05/17 1005  For home use only DME Negative pressure wound device  Once    Question Answer Comment  Frequency of dressing change 3 times per week   Length of need  3 Months   Dressing type Foam   Amount of suction 125 mm/Hg   Pressure application Continuous pressure   Supplies 10 canisters and 15 dressings per month for duration of therapy      04/05/17 1004     Follow-up Information    Serafina Mitchell, MD. Schedule an appointment as soon as possible for a visit in 2 week(s).   Specialties:  Vascular Surgery, Cardiology Why:  Hospital follow up  Contact information: [place] [address] [phone]        Marton Redwood, MD. Schedule an appointment as soon as possible for a visit in 1 week(s).   Specialty:  Internal Medicine Why:  Hospital follow up - anemia  Contact information: [address] [place] [address] [phone]          No Known Allergies  Consultations:  Vascular surgery - Dr. Trula Slade    Procedures/Studies: No results found.   Discharge Exam: Vitals:   04/04/17 2059 04/05/17 0500  BP: (!) 130/54 (!) 130/50  Pulse:  72  Resp:  16  Temp:  98.6 F (37 C)  SpO2:  94%   Vitals:   04/04/17 2005 04/04/17 2059 04/05/17 0500 04/05/17 0504  BP: (!) 137/47 (!) 130/54 (!) 130/50   Pulse: (!) 101  72   Resp: 18  16   Temp: 99.1 F (37.3 C)  98.6 F (37 C)   TempSrc: Oral  Oral   SpO2: 95%  94%   Weight:    104 kg (229 lb 4.5 oz)   Height:        General: Pallor improved, NAD  Cardiovascular: RRR, S1/S2 +, no rubs, no gallops Respiratory: CTA bilaterally, no wheezing, no rhonchi Abdominal: Soft, NT, ND, bowel sounds + Extremities: Left goring wound VAC in place, dressing c/d/i    The results of significant diagnostics from this hospitalization (including imaging, microbiology, ancillary and laboratory) are listed below for reference.     Microbiology: Recent Results (from the past 240 hour(s))  Surgical pcr screen     Status: None   Collection Time: 04/04/17  1:26 AM  Result Value Ref Range Status   MRSA, PCR NEGATIVE NEGATIVE Final   Staphylococcus aureus NEGATIVE NEGATIVE Final    Comment: (NOTE) The Xpert SA Assay (FDA approved for NASAL specimens in patients 39 years of age and older), is one component of a comprehensive surveillance program. It is not intended to diagnose infection nor to guide or monitor treatment.      Labs: BNP (last 3 results) No results for input(s): BNP in the last 8760 hours. Basic Metabolic Panel: Recent Labs  Lab 04/03/17 1923 04/04/17 0206  NA 138 137  K 3.4* 3.1*  CL 107 106  CO2 23 24  GLUCOSE 276* 144*  BUN 24* 20  CREATININE 1.28* 1.22*  CALCIUM 8.7* 8.4*   Liver Function Tests: Recent Labs  Lab 04/03/17 1923  AST 21  ALT 11*  ALKPHOS 57  BILITOT 0.4  PROT 5.9*  ALBUMIN 2.6*   No results for input(s): LIPASE, AMYLASE in the last 168 hours. No results for input(s): AMMONIA in the last 168 hours. CBC: Recent Labs  Lab 04/03/17 1923 04/04/17 0206 04/05/17 0835  WBC 6.3 6.7 6.2  NEUTROABS 4.6 4.7  --   HGB 8.2* 7.4* 7.7*  HCT 25.8* 23.4* 23.9*  MCV 96.6 95.5 94.5  PLT 133* 118* PLATELET CLUMPS NOTED ON SMEAR, UNABLE TO ESTIMATE   Cardiac Enzymes: No results  for input(s): CKTOTAL, CKMB, CKMBINDEX, TROPONINI in the last 168 hours. BNP: Invalid input(s): POCBNP CBG: Recent Labs  Lab 04/04/17 2003 04/04/17 2357 04/05/17 0459  04/05/17 0746 04/05/17 1146  GLUCAP 299* 210* 190* 169* 174*   D-Dimer No results for input(s): DDIMER in the last 72 hours. Hgb A1c No results for input(s): HGBA1C in the last 72 hours. Lipid Profile No results for input(s): CHOL, HDL, LDLCALC, TRIG, CHOLHDL, LDLDIRECT in the last 72 hours. Thyroid function studies No results for input(s): TSH, T4TOTAL, T3FREE, THYROIDAB in the last 72 hours.  Invalid input(s): FREET3 Anemia work up Recent Labs    04/04/17 1359  VITAMINB12 700  FOLATE 16.4  FERRITIN 635*  TIBC 255  IRON 35  RETICCTPCT 3.8*   Urinalysis    Component Value Date/Time   COLORURINE YELLOW 02/01/2017 1048   APPEARANCEUR CLEAR 02/01/2017 1048   LABSPEC 1.013 02/01/2017 1048   PHURINE 6.0 02/01/2017 1048   GLUCOSEU NEGATIVE 02/01/2017 1048   HGBUR NEGATIVE 02/01/2017 1048   BILIRUBINUR NEGATIVE 02/01/2017 1048   KETONESUR NEGATIVE 02/01/2017 1048   PROTEINUR NEGATIVE 02/01/2017 1048   UROBILINOGEN 1.0 07/28/2013 1305   NITRITE NEGATIVE 02/01/2017 1048   LEUKOCYTESUR NEGATIVE 02/01/2017 1048   Sepsis Labs Invalid input(s): PROCALCITONIN,  WBC,  LACTICIDVEN Microbiology Recent Results (from the past 240 hour(s))  Surgical pcr screen     Status: None   Collection Time: 04/04/17  1:26 AM  Result Value Ref Range Status   MRSA, PCR NEGATIVE NEGATIVE Final   Staphylococcus aureus NEGATIVE NEGATIVE Final    Comment: (NOTE) The Xpert SA Assay (FDA approved for NASAL specimens in patients 16 years of age and older), is one component of a comprehensive surveillance program. It is not intended to diagnose infection nor to guide or monitor treatment.      Time coordinating discharge: 32 minutes  SIGNED:  Chipper Oman, MD  [facility] 04/05/2017, 1:30 PM  Pager please text page via  [email or web address] Password [email or web address]

## 2017-04-05 NOTE — Care Management (Signed)
See previous note. HH orders given to [place] with [facility]. Adacia aware discharge is today.   Magdalen Spatz RN BSN [phone]

## 2017-04-05 NOTE — Progress Notes (Signed)
Discharge instructions, RX's and follow up appts explained and provided to patient verbalized understanding. Home health was notified about patient being d/c and new orders sent to them. Patient was place back on home wound vac without any complications. Patient left floor via wheelchair accompanied by staff no c/o pain or shortness of breath at d/c.   Phylicia Mcgaugh, Tivis Ringer, RN

## 2017-04-25 ENCOUNTER — Telehealth: Payer: Self-pay | Admitting: *Deleted

## 2017-04-25 NOTE — Telephone Encounter (Signed)
Spoke to [employer] from [facility].  She states that wound is  Improving, tunneling had measured 7 cm and is now 3 cm.  They are using white foam for the tunneling and black foam for the surface.  A verbal order was given to continue as they have been dressing the wound.

## 2017-04-30 ENCOUNTER — Ambulatory Visit: Payer: Self-pay | Admitting: Family

## 2017-04-30 ENCOUNTER — Encounter: Payer: Self-pay | Admitting: Family

## 2017-04-30 ENCOUNTER — Ambulatory Visit (INDEPENDENT_AMBULATORY_CARE_PROVIDER_SITE_OTHER): Payer: Self-pay | Admitting: Family

## 2017-04-30 ENCOUNTER — Other Ambulatory Visit: Payer: Self-pay

## 2017-04-30 VITALS — BP 130/52 | HR 62 | Temp 97.4°F | Resp 16 | Ht 63.0 in | Wt 225.0 lb

## 2017-04-30 DIAGNOSIS — I97640 Postprocedural seroma of a circulatory system organ or structure following a cardiac catheterization: Secondary | ICD-10-CM

## 2017-04-30 NOTE — Progress Notes (Signed)
Postoperative Visit   History of Present Illness  Carrie Monroe is a 81 y.o. year old female who is s/p I&D left groin and placement of wound VAC on 04-04-17 by Dr. Trula Slade for infected left groin. The patient has previously undergone repair of bleeding from sheath removal during TAVR.  She had a significant retroperitoneal hematoma.  She was taken back to the operating room multiple times and was ultimately discharged with a wound VAC.  She presented to the emergency department on 04-03-17 with a foul-smelling odor from the incision as well as erythema.    She returns today at the request of [facility] who reports new open area with watery blood tinged drainage in left groin crease.   Pt denies fever or chills, does not c/o pain. Pt and daughter state that the wound has filled in a great deal, much improved.   HH changes left groin wound vac every M-W-F.   Her DM is diet controlled.   Nicolette from [facility] spoke with Alinda Sierras LPN on [id number].  She states that wound is improving, tunneling had measured 7 cm and is now 3 cm.  They are using white foam for the tunneling and black foam for the surface.   Pt is scheduled to return on 05-06-17 with carotid duplex, wound check,  and see Dr. Trula Slade  The patient is able to complete their activities of daily living.  Pt states she walks in her home w/o difficulty.   For VQI Use Only  PRE-ADM LIVING: Home  AMB STATUS: Wheelchair   Past Medical History:  Diagnosis Date  . Aortic stenosis, severe    a. 01/2017: s/p TAVR; hospital course complicated by large groin hematoma/wound  . Arthritis   . Breast cancer ([place])    a. L breast cancer s/p lumpectomy and XRT  . Carotid artery stenosis    a. 11/2016: 80-99% RICA stenosis, 1-39% vs low range 21-19% LICA   . Chronic diastolic CHF (congestive heart failure) ([place])   . CKD (chronic kidney disease)   . Coronary artery disease    a. 11/2016: diagnosed with multivessel CAD,  turned down for CABG and underwent PCI/DES to mLCx, PCI/DES to mLAD and PCTA of ostial diagonal on 12/28/16  . Diabetes mellitus    a. diet controlled   . Exogenous obesity   . Gout   . Hemorrhoids   . History of kidney stones   . Hyperlipidemia   . Hypertension   . 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 APPLICATION OF WOUND VAC Left 02/05/2017   Procedure: APPLICATION OF WOUND VAC;  Surgeon: Serafina Mitchell, MD;  Location: [place];  Service: Vascular;  Laterality: Left;  . APPLICATION OF WOUND VAC Left 02/22/2017   Procedure: APPLICATION OF WOUND VAC LEFT GROIN;  Surgeon: Serafina Mitchell, MD;  Location: [place];  Service: Vascular;  Laterality: Left;  . APPLICATION OF WOUND VAC Left 04/04/2017   Procedure: APPLICATION OF WOUND VAC;  Surgeon: Serafina Mitchell, MD;  Location: [place];  Service: Vascular;  Laterality: Left;  . BREAST LUMPECTOMY  2009   left  . BREAST LUMPECTOMY WITH NEEDLE LOCALIZATION Left 01/06/2015   Procedure: BREAST BIOPSY WITH NEEDLE LOCALIZATION AND SKIN BIOPSY;  Surgeon: Autumn Messing III, MD;  Location: [place];  Service: General;  Laterality: Left;  . CATARACT EXTRACTION    . CHOLECYSTECTOMY  2010  . CORONARY STENT INTERVENTION N/A 12/28/2016   Procedure: Coronary Stent Intervention;  Surgeon: Burnell Blanks, MD;  Location: [place] [facility];  Service: Cardiovascular;  Laterality: N/A;  . EYE SURGERY     BILATERAL CATARACT EXTRACTIONS AND LENS IMPLANTS  . FEMORAL ARTERY EXPLORATION N/A 02/05/2017   Procedure: Evacuation of Retroperitoneal Hematoma and Primary Repair of Femoral Artery and FEMORAL ARTERY EXPLORATION;  Surgeon: Serafina Mitchell, MD;  Location: [place];  Service: Vascular;  Laterality: N/A;  . HEMATOMA EVACUATION Left 02/08/2017   Procedure:  EVACUATION HEMATOMA;  Surgeon: Rosetta Posner, MD;  Location: [place];  Service: Vascular;  Laterality: Left;  . I&D EXTREMITY Left 02/22/2017   Procedure: [place];  Surgeon: Serafina Mitchell, MD;  Location: [place];  Service: Vascular;  Laterality: Left;  . I&D EXTREMITY Left 04/04/2017   Procedure: [place];  Surgeon: Serafina Mitchell, MD;  Location: [place];  Service: Vascular;  Laterality: Left;  . KNEE SURGERY     right PARTIAL KNEE REPLACEMENT  . LEFT HEART CATH AND CORONARY ANGIOGRAPHY N/A 12/17/2016   Procedure: Left Heart Cath and Coronary Angiography;  Surgeon: Burnell Blanks, MD;  Location: [place] [facility];  Service: Cardiovascular;  Laterality: N/A;  . MULTIPLE EXTRACTIONS WITH ALVEOLOPLASTY N/A 12/21/2016   Procedure: Extraction of tooth #'s 12, 23,24,25,26 and 29 with alveoloplasty and gross debridement of remaining teeth.;  Surgeon: Lenn Cal, DDS;  Location: [place];  Service: Oral Surgery;  Laterality: N/A;  . SHOULDER SURGERY     right  . SKIN GRAFT TO RIGHT HAND     AFTER BURN TO THE HAND  . TEE WITHOUT CARDIOVERSION N/A 02/05/2017   Procedure: TRANSESOPHAGEAL ECHOCARDIOGRAM (TEE);  Surgeon: Burnell Blanks, MD;  Location: [place];  Service: Open Heart Surgery;  Laterality: N/A;  . TOTAL KNEE ARTHROPLASTY Left 08/03/2013   Procedure: TOTAL LEFT KNEE ARTHROPLASTY;  Surgeon: Gearlean Alf, MD;  Location: [facility];  Service: Orthopedics;  Laterality: Left;  . TRANSCATHETER AORTIC VALVE REPLACEMENT, TRANSFEMORAL N/A 02/05/2017   Procedure: TRANSCATHETER AORTIC VALVE REPLACEMENT, TRANSFEMORAL;  Surgeon: Burnell Blanks, MD;  Location: [place];  Service: Open Heart Surgery;  Laterality: N/A;  . US ECHOCARDIOGRAPHY  05/15/2010   EF 60-65%    Social History   Socioeconomic History  . Marital status: Married    Spouse name: Not on file  . Number of children: 4  . Years of education: Not on file  .  Highest education level: Not on file  Social Needs  . Financial resource strain: Not on file  . Food insecurity - worry: Not on file  . Food insecurity - inability: Not on file  . Transportation needs - medical: Not on file  . Transportation needs - non-medical: Not on file  Occupational History  . 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 Smoking status: Never Smoker  . Smokeless tobacco: Never Used  Substance and Sexual Activity  . Alcohol use: No  . Drug use: No  . Sexual activity: Not Currently  Other Topics Concern  . Not on file  Social History Narrative   Epworth Sleepiness scale score =11 as of 01/25/16    No Known Allergies  Current Outpatient Medications on File Prior to Visit  Medication Sig Dispense Refill  . allopurinol (ZYLOPRIM) 300 MG  tablet Take 300 mg by mouth daily as needed (gout).    Marland Kitchen aspirin 81 MG chewable tablet Chew 1 tablet (81 mg total) by mouth daily.    . Cholecalciferol (VITAMIN D PO) Take 1 capsule by mouth daily.    . clopidogrel (PLAVIX) 75 MG tablet Take 1 tablet (75 mg total) by mouth daily with breakfast. 30 tablet 6  . colchicine 0.6 MG tablet Take 0.6 mg by mouth every 8 (eight) hours as needed (gout flare).    . COLLAGEN PO Take by mouth. power    . ferrous sulfate (FERROUSUL) 325 (65 FE) MG tablet Take 1 tablet (325 mg total) 3 (three) times daily with meals by mouth. 90 tablet 0  . furosemide (LASIX) 40 MG tablet Take 40 mg by mouth daily.    . hydrALAZINE (APRESOLINE) 50 MG tablet Take 50 mg 2 (two) times daily by mouth.     . metoprolol tartrate (LOPRESSOR) 25 MG tablet Take 1 tablet (25 mg total) by mouth 2 (two) times daily. 60 tablet 5  . Misc Natural Products (FIBER 7 PO) Take by mouth. Hemp power    . oxyCODONE-acetaminophen (PERCOCET/ROXICET) 5-325 MG tablet Take 1-2 tablets by mouth every 4 (four) hours as needed for moderate pain. 30 tablet 0  . rosuvastatin (CRESTOR) 10 MG tablet Take 10 mg by mouth daily.   0   . tamoxifen (NOLVADEX) 20 MG tablet Take 1 tablet by mouth every morning. 90 tablet 3  . vitamin B-12 (CYANOCOBALAMIN) 100 MCG tablet Take 100 mcg daily by mouth.    . zinc sulfate 220 (50 Zn) MG capsule Take 220 mg daily by mouth.    . hydrochlorothiazide (MICROZIDE) 12.5 MG capsule   1   No current facility-administered medications on file prior to visit.       Physical Examination  Vitals:   04/30/17 1317  BP: (!) 130/52  Pulse: 62  Resp: 16  Temp: (!) 97.4 F (36.3 C)  TempSrc: Oral  SpO2: 96%  Weight: 225 lb (102.1 kg)  Height: 5\' 3"  (1.6 m)   Body mass index is 39.86 kg/m.      Left groin wound   Left groin wound bed with viable appearing tissue, tunneled area is granulating by history, no signs of infection, bilateral pedal pulses are palpable.  2 small openings at the medial aspect of left groin are draining a small amount of clear watery drainage, likely lymph fluid.   Medical Decision Making  KEENA DINSE is a 81 y.o. year old female who presents s/p I&D left groin and placement of wound VAC on 04-04-17.   Two new small openings that are draining a scant amount of clear watery drainage are likely draining seromas. Wound tunnel with foam in place, changed yesterday and again tomorrow by [facility] with wound vac.  Left groin wound is healing and granulating well.  Pt is scheduled to return on 05-06-17 with carotid duplex, wound check,  and see Dr. Ellouise Newer advised her and her daughter to notify us if they develop concerns re her left groin wound.    Clemon Chambers, RN, MSN, FNP-C [facility] [place] Office: [phone]  04/30/2017, 1:35 PM  Clinic MD: Early

## 2017-05-06 ENCOUNTER — Ambulatory Visit: Payer: Medicare Other | Admitting: Surgery

## 2017-05-06 ENCOUNTER — Encounter (HOSPITAL_COMMUNITY): Payer: Self-pay

## 2017-05-27 DIAGNOSIS — I350 Nonrheumatic aortic (valve) stenosis: Secondary | ICD-10-CM | POA: Insufficient documentation

## 2017-05-27 DIAGNOSIS — N189 Chronic kidney disease, unspecified: Secondary | ICD-10-CM | POA: Insufficient documentation

## 2017-05-27 DIAGNOSIS — E6609 Other obesity due to excess calories: Secondary | ICD-10-CM | POA: Insufficient documentation

## 2017-05-27 DIAGNOSIS — Z87442 Personal history of urinary calculi: Secondary | ICD-10-CM | POA: Insufficient documentation

## 2017-05-27 DIAGNOSIS — I5032 Chronic diastolic (congestive) heart failure: Secondary | ICD-10-CM | POA: Insufficient documentation

## 2017-05-27 DIAGNOSIS — I10 Essential (primary) hypertension: Secondary | ICD-10-CM | POA: Insufficient documentation

## 2017-05-27 DIAGNOSIS — E785 Hyperlipidemia, unspecified: Secondary | ICD-10-CM | POA: Insufficient documentation

## 2017-05-27 DIAGNOSIS — M1990 Unspecified osteoarthritis, unspecified site: Secondary | ICD-10-CM | POA: Insufficient documentation

## 2017-05-27 DIAGNOSIS — K649 Unspecified hemorrhoids: Secondary | ICD-10-CM | POA: Insufficient documentation

## 2017-05-27 DIAGNOSIS — C50919 Malignant neoplasm of unspecified site of unspecified female breast: Secondary | ICD-10-CM | POA: Insufficient documentation

## 2017-05-30 ENCOUNTER — Ambulatory Visit (HOSPITAL_COMMUNITY)
Admission: RE | Admit: 2017-05-30 | Discharge: 2017-05-30 | Disposition: A | Discharge status: Home / Self Care | Payer: Medicare Other | Source: Ambulatory Visit | Attending: Surgery | Admitting: Surgery

## 2017-05-30 DIAGNOSIS — K661 Hemoperitoneum: Secondary | ICD-10-CM | POA: Diagnosis not present

## 2017-05-30 LAB — VAS US CAROTID
LCCADDIAS: 21 cm/s
LCCADSYS: 102 cm/s
LEFT ECA DIAS: -14 cm/s
LICADSYS: -173 cm/s
LICAPDIAS: -42 cm/s
Left CCA prox dias: 35 cm/s
Left CCA prox sys: 139 cm/s
Left ICA dist dias: -64 cm/s
Left ICA prox sys: -145 cm/s
RCCAPDIAS: -9 cm/s
RCCAPSYS: -87 cm/s
RIGHT CCA MID DIAS: 14 cm/s
RIGHT ECA DIAS: 9 cm/s
RIGHT VERTEBRAL DIAS: 29 cm/s
Right cca dist sys: -103 cm/s

## 2017-06-03 ENCOUNTER — Encounter: Payer: Self-pay | Admitting: Surgery

## 2017-06-03 ENCOUNTER — Ambulatory Visit: Payer: Medicare Other | Admitting: Surgery

## 2017-06-03 VITALS — BP 156/76 | HR 84 | Temp 99.7°F | Resp 20 | Ht 63.0 in | Wt 228.0 lb

## 2017-06-03 DIAGNOSIS — I6523 Occlusion and stenosis of bilateral carotid arteries: Secondary | ICD-10-CM | POA: Diagnosis not present

## 2017-06-03 NOTE — Progress Notes (Signed)
[facility] [place]  Patient name: Carrie Monroe MRN: [id number] DOB: 05-26-1936 Sex: female   REASON FOR VISIT:    Follow up  Anthony:    ALDONIA KEEVEN is a 82 y.o. female who is here today for follow-up.  She is status post TAVR on 02/05/2017.  This is, located by bleeding from her cannulation site on the left.  She went back to the operating room emergently and had repair of the arteriotomy.  On 02/22/2017 she was taken back to the operating room for I and D of her wound.  Her wound went way up into the retroperitoneum.  This was irrigated and debridement with placement of antibiotic beads and wound VAC.    During her preoperative workup for herTAVR, she was found to have a high-grade right carotid stenosis.  She had a recent ultrasound which confirmed a greater than 80% right carotid stenosis.  She is asymptomatic.  She does not want an operation.   PAST MEDICAL HISTORY:   Past Medical History:  Diagnosis Date  . Aortic stenosis, severe    a. 01/2017: s/p TAVR; hospital course complicated by large groin hematoma/wound  . Arthritis   . Breast cancer ([place])    a. L breast cancer s/p lumpectomy and XRT  . Carotid artery stenosis    a. 11/2016: 80-99% RICA stenosis, 1-39% vs low range 12-45% LICA   . Chronic diastolic CHF (congestive heart failure) ([place])   . CKD (chronic kidney disease)   . Coronary artery disease    a. 11/2016: diagnosed with multivessel CAD, turned down for CABG and underwent PCI/DES to mLCx, PCI/DES to mLAD and PCTA of ostial diagonal on 12/28/16  . Diabetes mellitus    a. diet controlled   . Exogenous obesity   . Gout   . Hemorrhoids   . History of kidney stones   . Hyperlipidemia   . Hypertension   . 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 Cancer Mother        pancreatic  . Heart disease Father   . Hypertension Father   . Cancer Brother   . Diabetes Sister     SOCIAL HISTORY:   Social History   Tobacco Use  . Smoking status: Never Smoker  . Smokeless tobacco: Never Used  Substance Use Topics  . Alcohol use: No     ALLERGIES:   No Known Allergies   CURRENT MEDICATIONS:   Current Outpatient Medications  Medication Sig Dispense Refill  . allopurinol (ZYLOPRIM) 300 MG tablet Take 300 mg by mouth daily as needed (gout).    Marland Kitchen aspirin 81 MG chewable tablet Chew 1 tablet (81 mg total) by mouth daily.    . Cholecalciferol (VITAMIN D PO) Take 1 capsule by mouth daily.    . clopidogrel (PLAVIX) 75 MG tablet Take 1 tablet (75 mg total) by mouth daily with breakfast. 30 tablet 6  . colchicine 0.6 MG tablet Take 0.6 mg by mouth every 8 (eight) hours as needed (gout flare).    . COLLAGEN PO Take by mouth. power    . ferrous sulfate (FERROUSUL) 325 (65 FE) MG tablet Take 1 tablet (325 mg total) 3 (three) times daily with meals by mouth. 90 tablet 0  .  furosemide (LASIX) 40 MG tablet Take 40 mg by mouth daily.    . hydrALAZINE (APRESOLINE) 50 MG tablet Take 50 mg 2 (two) times daily by mouth.     . hydrochlorothiazide (MICROZIDE) 12.5 MG capsule   1  . metoprolol tartrate (LOPRESSOR) 25 MG tablet Take 1 tablet (25 mg total) by mouth 2 (two) times daily. 60 tablet 5  . Misc Natural Products (FIBER 7 PO) Take by mouth. Hemp power    . oxyCODONE-acetaminophen (PERCOCET/ROXICET) 5-325 MG tablet Take 1-2 tablets by mouth every 4 (four) hours as needed for moderate pain. 30 tablet 0  . rosuvastatin (CRESTOR) 10 MG tablet Take 10 mg by mouth daily.   0  . tamoxifen (NOLVADEX) 20 MG tablet Take 1 tablet by mouth every morning. 90 tablet 3  . vitamin B-12 (CYANOCOBALAMIN) 100 MCG tablet Take 100 mcg daily by mouth.    . zinc sulfate 220 (50 Zn)  MG capsule Take 220 mg daily by mouth.     No current facility-administered medications for this visit.     REVIEW OF SYSTEMS:   [X]  denotes positive finding, [ ]  denotes negative finding Cardiac  Comments:  Chest pain or chest pressure:    Shortness of breath upon exertion:    Short of breath when lying flat:    Irregular heart rhythm:        Vascular    Pain in calf, thigh, or hip brought on by ambulation:    Pain in feet at night that wakes you up from your sleep:     Blood clot in your veins:    Leg swelling:         Pulmonary    Oxygen at home:    Productive cough:     Wheezing:         Neurologic    Sudden weakness in arms or legs:     Sudden numbness in arms or legs:     Sudden onset of difficulty speaking or slurred speech:    Temporary loss of vision in one eye:     Problems with dizziness:         Gastrointestinal    Blood in stool:     Vomited blood:         Genitourinary    Burning when urinating:     Blood in urine:        Psychiatric    Major depression:         Hematologic    Bleeding problems:    Problems with blood clotting too easily:        Skin    Rashes or ulcers:        Constitutional    Fever or chills:      PHYSICAL EXAM:   Vitals:   06/03/17 1105  BP: (!) 156/76  Pulse: 84  Resp: 20  Temp: 99.7 F (37.6 C)  TempSrc: Oral  SpO2: 90%  Weight: 228 lb (103.4 kg)  Height: 5\' 3"  (1.6 m)    GENERAL: The patient is a well-nourished female, in no acute distress. The vital signs are documented above. CARDIAC: There is a regular rate and rhythm.  PULMONARY: Non-labored respirations ABDOMEN: Soft and non-tender with normal pitched bowel sounds.  MUSCULOSKELETAL: There are no major deformities or cyanosis. NEUROLOGIC: No focal weakness or paresthesias are detected. SKIN: Left groin incision is nearly completely healed.  The wound is at the level of the skin surface.  There is just raw surface  area present measuring approximately 6  x 4 cm. PSYCHIATRIC: The patient has a normal affect.  STUDIES:   I have reviewed her carotid duplex which shows greater than 80% right carotid stenosis  MEDICAL ISSUES:   Carotid stenosis: The patient is asymptomatic.  She has a greater than 80% right carotid stenosis.  She is adamant that she does not want another operation.  We will discuss this further when she returns for a wound check in 6 months  Left groin wound: The wound is almost completely healed today.  I am stopping her wound VAC.  She will do Xeroform or Vaseline gauze changes daily.  She will follow-up in 6 weeks.    [name], MD [facility] [phone] Pager [phone]

## 2017-06-11 ENCOUNTER — Encounter: Payer: Self-pay | Admitting: Cardiovascular Disease

## 2017-06-11 ENCOUNTER — Ambulatory Visit: Payer: Medicare Other | Admitting: Cardiovascular Disease

## 2017-06-11 VITALS — BP 128/50 | HR 86 | Ht 63.0 in | Wt 226.0 lb

## 2017-06-11 DIAGNOSIS — I5032 Chronic diastolic (congestive) heart failure: Secondary | ICD-10-CM

## 2017-06-11 DIAGNOSIS — I6521 Occlusion and stenosis of right carotid artery: Secondary | ICD-10-CM

## 2017-06-11 DIAGNOSIS — E669 Obesity, unspecified: Secondary | ICD-10-CM | POA: Diagnosis not present

## 2017-06-11 DIAGNOSIS — T148XXA Other injury of unspecified body region, initial encounter: Secondary | ICD-10-CM | POA: Diagnosis not present

## 2017-06-11 DIAGNOSIS — I2510 Atherosclerotic heart disease of native coronary artery without angina pectoris: Secondary | ICD-10-CM

## 2017-06-11 DIAGNOSIS — L089 Local infection of the skin and subcutaneous tissue, unspecified: Secondary | ICD-10-CM | POA: Diagnosis not present

## 2017-06-11 DIAGNOSIS — E1169 Type 2 diabetes mellitus with other specified complication: Secondary | ICD-10-CM

## 2017-06-11 DIAGNOSIS — Z952 Presence of prosthetic heart valve: Secondary | ICD-10-CM | POA: Diagnosis not present

## 2017-06-11 DIAGNOSIS — D62 Acute posthemorrhagic anemia: Secondary | ICD-10-CM

## 2017-06-11 DIAGNOSIS — I10 Essential (primary) hypertension: Secondary | ICD-10-CM

## 2017-06-11 MED ORDER — METOPROLOL TARTRATE 25 MG PO TABS: 12.5000 mg | ORAL_TABLET | Freq: Two times a day (BID) | ORAL | 3 refills | Status: DC

## 2017-06-11 MED ORDER — FUROSEMIDE 40 MG PO TABS: 40.0000 mg | ORAL_TABLET | Freq: Every day | ORAL | 1 refills | Status: DC | PRN

## 2017-06-11 NOTE — Progress Notes (Signed)
Cardiology Office Note    Date:  06/11/2017   ID:  Carrie Monroe, DOB 1936-01-19, MRN [id number]  PCP:  Marton Redwood, MD  Cardiologist:   Sanda Klein, MD   Chief Complaint  Patient presents with  . Follow-up    3 months  . Shortness of Breath  . Headache    History of Present Illness:  STEPHAIE Monroe is a 82 y.o. female with CAD s/p PCI, aortic stenosis s/p TAVR, chronic diastolic heart failure, 95% right internal carotid artery stenosis, hypertension, hyperlipidemia, obesity, diabetes mellitus and remote history of left breast surgery and radiation therapy for cancer roughly 5 years ago.  She is accompanied here today by her daughter, as at her last appointment.  After her appointments in our office last August she was found to have both coronary disease and severe aortic stenosis. She underwent uncomplicated stent to the LAD artery and right coronary artery in early August). She then had TAVR in September 2018, the procedure there was complicated by severe hemorrhage in the left groin access site. She had circulatory collapse, required 22 units of there is blood products and developed a large wound with infection in the left groin, but has gradually improved and is recovering from this. She still has a slowly healing wound in the right groin but no longer requires a wound VAC.  Due to the slow recovery from her complicated aortic stenosis procedure, it's hard to say how much she is benefited clinically. Her echocardiogram shows an excellent results with a mean gradient across the aortic prosthesis of only 3 mmHg and normal left ventricular systolic function.  She has not had any problems with angina pectoris, new bleeding, falls, syncope, dizziness, major problems with leg edema (the left leg is always a little bit more swollen), palpitations or focal neurological events.  She is taking iron supplement and her most recent iron studies did not show clear evidence of iron  deficiency. She is not taking folic acid supplements and her appetite is poor. She still looks pale.  Past Medical History:  Diagnosis Date  . Aortic stenosis, severe    a. 01/2017: s/p TAVR; hospital course complicated by large groin hematoma/wound  . Arthritis   . Breast cancer ([place])    a. L breast cancer s/p lumpectomy and XRT  . Carotid artery stenosis    a. 11/2016: 80-99% RICA stenosis, 1-39% vs low range 18-84% LICA   . Chronic diastolic CHF (congestive heart failure) ([place])   . CKD (chronic kidney disease)   . Coronary artery disease    a. 11/2016: diagnosed with multivessel CAD, turned down for CABG and underwent PCI/DES to mLCx, PCI/DES to mLAD and PCTA of ostial diagonal on 12/28/16  . Diabetes mellitus    a. diet controlled   . Exogenous obesity   . Gout   . Hemorrhoids   . History of kidney stones   . Hyperlipidemia   . Hypertension   . 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 APPLICATION OF WOUND VAC Left 02/05/2017   Procedure: APPLICATION OF WOUND VAC;  Surgeon: Serafina Mitchell, MD;  Location: [facility];  Service: Vascular;  Laterality: Left;  . APPLICATION OF WOUND VAC Left 02/22/2017   Procedure: APPLICATION OF WOUND VAC LEFT GROIN;  Surgeon: Serafina Mitchell, MD;  [facility];  Service: Vascular;  Laterality: Left;  . APPLICATION OF WOUND VAC Left 04/04/2017   Procedure: APPLICATION OF WOUND VAC;  Surgeon: Serafina Mitchell, MD;  Location: [facility];  Service: Vascular;  Laterality: Left;  . BREAST LUMPECTOMY  2009   left  . BREAST LUMPECTOMY WITH NEEDLE LOCALIZATION Left 01/06/2015   Procedure: BREAST BIOPSY WITH NEEDLE LOCALIZATION AND SKIN BIOPSY;  Surgeon: Autumn Messing III, MD;  Location: [facility];  Service: General;  Laterality: Left;  . CATARACT EXTRACTION    .  CHOLECYSTECTOMY  2010  . CORONARY STENT INTERVENTION N/A 12/28/2016   Procedure: Coronary Stent Intervention;  Surgeon: Burnell Blanks, MD;  Location: [facility];  Service: Cardiovascular;  Laterality: N/A;  . EYE SURGERY     BILATERAL CATARACT EXTRACTIONS AND LENS IMPLANTS  . FEMORAL ARTERY EXPLORATION N/A 02/05/2017   Procedure: Evacuation of Retroperitoneal Hematoma and Primary Repair of Femoral Artery and FEMORAL ARTERY EXPLORATION;  Surgeon: Serafina Mitchell, MD;  Location: [facility];  Service: Vascular;  Laterality: N/A;  . HEMATOMA EVACUATION Left 02/08/2017   Procedure: EVACUATION HEMATOMA;  Surgeon: Rosetta Posner, MD;  Location: [facility];  Service: Vascular;  Laterality: Left;  . I&D EXTREMITY Left 02/22/2017   Procedure: [place];  Surgeon: Serafina Mitchell, MD;  Location: [facility];  Service: Vascular;  Laterality: Left;  . I&D EXTREMITY Left 04/04/2017   Procedure: [place];  Surgeon: Serafina Mitchell, MD;  Location: [facility];  Service: Vascular;  Laterality: Left;  . KNEE SURGERY     right PARTIAL KNEE REPLACEMENT  . LEFT HEART CATH AND CORONARY ANGIOGRAPHY N/A 12/17/2016   Procedure: Left Heart Cath and Coronary Angiography;  Surgeon: Burnell Blanks, MD;  Location: [facility];  Service: Cardiovascular;  Laterality: N/A;  . MULTIPLE EXTRACTIONS WITH ALVEOLOPLASTY N/A 12/21/2016   Procedure: Extraction of tooth #'s 12, 23,24,25,26 and 29 with alveoloplasty and gross debridement of remaining teeth.;  Surgeon: Lenn Cal, DDS;  Location: [facility];  Service: Oral Surgery;  Laterality: N/A;  . SHOULDER SURGERY     right  . SKIN GRAFT TO RIGHT HAND     AFTER BURN TO THE HAND  . TEE WITHOUT CARDIOVERSION N/A 02/05/2017   Procedure: TRANSESOPHAGEAL ECHOCARDIOGRAM (TEE);  Surgeon: Burnell Blanks, MD;  Location: [facility];  Service: Open Heart Surgery;  Laterality: N/A;  . TOTAL KNEE ARTHROPLASTY Left 08/03/2013     Procedure: TOTAL LEFT KNEE ARTHROPLASTY;  Surgeon: Gearlean Alf, MD;  Location: [facility];  Service: Orthopedics;  Laterality: Left;  . TRANSCATHETER AORTIC VALVE REPLACEMENT, TRANSFEMORAL N/A 02/05/2017   Procedure: TRANSCATHETER AORTIC VALVE REPLACEMENT, TRANSFEMORAL;  Surgeon: Burnell Blanks, MD;  Location: [facility];  Service: Open Heart Surgery;  Laterality: N/A;  . US ECHOCARDIOGRAPHY  05/15/2010   EF 60-65%    Current Medications: Outpatient Medications Prior to Visit  Medication Sig Dispense Refill  . allopurinol (ZYLOPRIM) 300 MG tablet Take 300 mg by mouth daily as needed (gout).    Marland Kitchen aspirin 81 MG chewable tablet Chew 1 tablet (81 mg total) by mouth daily.    . Cholecalciferol (VITAMIN D PO) Take 1 capsule by mouth daily.    . clopidogrel (PLAVIX) 75 MG tablet Take 1 tablet (75 mg total) by mouth daily with breakfast. 30 tablet 6  . colchicine 0.6 MG tablet Take 0.6 mg by mouth every 8 (eight) hours as needed (gout  flare).    . COLLAGEN PO Take by mouth. power    . ferrous sulfate (FERROUSUL) 325 (65 FE) MG tablet Take 1 tablet (325 mg total) 3 (three) times daily with meals by mouth. 90 tablet 0  . hydrALAZINE (APRESOLINE) 50 MG tablet Take 50 mg 2 (two) times daily by mouth.     . Misc Natural Products (FIBER 7 PO) Take by mouth. Hemp power    . rosuvastatin (CRESTOR) 10 MG tablet Take 10 mg by mouth daily.   0  . tamoxifen (NOLVADEX) 20 MG tablet Take 1 tablet by mouth every morning. 90 tablet 3  . furosemide (LASIX) 40 MG tablet Take 40 mg by mouth daily.    . hydrochlorothiazide (MICROZIDE) 12.5 MG capsule   1  . metoprolol tartrate (LOPRESSOR) 25 MG tablet Take 1 tablet (25 mg total) by mouth 2 (two) times daily. 60 tablet 5  . oxyCODONE-acetaminophen (PERCOCET/ROXICET) 5-325 MG tablet Take 1-2 tablets by mouth every 4 (four) hours as needed for moderate pain. 30 tablet 0  . vitamin B-12 (CYANOCOBALAMIN) 100 MCG tablet Take 100 mcg daily by mouth.    . zinc sulfate  220 (50 Zn) MG capsule Take 220 mg daily by mouth.     No facility-administered medications prior to visit.      Allergies:   Patient has no known allergies.   Social History   Socioeconomic History  . Marital status: Married    Spouse name: Not on file  . Number of children: 4  . Years of education: Not on file  . Highest education level: Not on file  Social Needs  . Financial resource strain: Not on file  . Food insecurity - worry: Not on file  . Food insecurity - inability: Not on file  . Transportation needs - medical: Not on file  . Transportation needs - non-medical: Not on file  Occupational History  . 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 Smoking status: Never Smoker  . Smokeless tobacco: Never Used  Substance and Sexual Activity  . Alcohol use: No  . Drug use: No  . Sexual activity: Not Currently  Other Topics Concern  . Not on file  Social History Narrative   Epworth Sleepiness scale score =11 as of 01/25/16     Family History:  [name] family history includes Cancer in her brother and mother; Diabetes in her sister; Heart disease in her father; Hypertension in her father.   ROS:   Please see the history of present illness.    ROS All other systems reviewed and are negative.   PHYSICAL EXAM:   VS:  BP (!) 128/50   Pulse 86   Ht 5\' 3"  (1.6 m)   Wt 226 lb (102.5 kg)   BMI 40.03 kg/m     General: Alert, oriented x3, no distress, pale, morbidly obese Head: no evidence of trauma, PERRL, EOMI, no exophtalmos or lid lag, no myxedema, no xanthelasma; normal ears, nose and oropharynx Neck: normal jugular venous pulsations and no hepatojugular reflux; brisk carotid pulses without delay and relatively faint right carotid bruits Chest: clear to auscultation, no signs of consolidation by percussion or palpation, normal fremitus, symmetrical and full respiratory excursions Cardiovascular: normal position and quality of the apical impulse,  regular rhythm, normal first and second heart sounds, no diastolic murmurs, rubs or gallops, 1-2/6 early peaking systolic ejection murmur in the aortic focus Abdomen: no tenderness or distention, no masses by palpation, no abnormal pulsatility  or arterial bruits, normal bowel sounds, no hepatosplenomegaly Extremities: no clubbing, cyanosis, there is 1+ left ankle edema and there is no right ankle edema; 2+ radial, ulnar and brachial pulses bilaterally; 2+ right femoral, posterior tibial and dorsalis pedis pulses; wound dressed over left groin, 2+ left posterior tibial and dorsalis pedis pulses; Neurological: grossly nonfocal Psych: Normal mood and affect   Wt Readings from Last 3 Encounters:  06/11/17 226 lb (102.5 kg)  06/03/17 228 lb (103.4 kg)  04/30/17 225 lb (102.1 kg)      Studies/Labs Reviewed:   EKG:  EKG is ordered today.  The ekg ordered today demonstrates Sinus rhythm, LVH with voltage partially masked by obesity, mild repolarization abnormalities   ASSESSMENT:    1. Acute blood loss anemia   2. Chronic diastolic heart failure ([place])   3. S/P TAVR (transcatheter aortic valve replacement)   4. Coronary artery disease involving native coronary artery of native heart without angina pectoris   5. Essential hypertension   6. Diabetes mellitus type 2 in obese ([facility])   7. Stenosis of right carotid artery   8. Morbid obesity due to excess calories ([place])   9. Wound infection      PLAN:  In order of problems listed above:  1. CHF: She appears to be clinically euvolemic and her weight has been steady over the last 5 weeks or so. She does have dyspnea but also fatigue and is quite pale. Before her aortic valve procedure she was only taking furosemide "as needed" and will switch back to that same recommendation. She is to call us if her weight increases more than 3-5 pounds from today's weight. I think anemia is contributing to her problems. We'll recheck her hemoglobin today and I  advised that she start taking a folic acid supplement. 2. AS s/p TAVR: 34mm Medtronic CoreValve Evolut Pro in September 2018. Excellent technical result, unfortunately the procedure was complicated by Erlene Quan groin bleeding from which she is still recovering. She is aware of the need for endocarditis prophylaxis. She will be on dual antiplatelet therapy for her coronary stents until next August 3. CAD s/p PCI: Synergy DES LAD and RCA in August 2018, continue aspirin and Plavix until August 2019 at least. 4. HTN: Excellent, possibly excessive blood pressure control. Will decrease her metoprolol dose in half due to her complaints of fatigue. I suspect she needs a slightly faster heart rate because of anemia. 5. DM: Reports good control.  6. Right carotid stenosis: Asymptomatic. She has an 80% stenosis but does not want to undergo any further surgery after her discussion with Dr. Trula Slade earlier this month. On aspirin/Plavix and statin, no history of TIA/CVA. Discussed possible symptoms of an acute neurological event and the need for urgent medical attention should these occur. 7. Obesity: Contributing to dyspnea. 8. Anemia: Recheck today. Start folic acid supplements. Continue iron 9. Wound infection is healing steadily. No signs of systemic infection.    Medication Adjustments/Labs and Tests Ordered: Current medicines are reviewed at length with the patient today.  Concerns regarding medicines are outlined above.  Medication changes, Labs and Tests ordered today are listed in the Patient Instructions below. Patient Instructions  Dr Luane School has recommended making the following medication changes: 1. DECREASE Metoprolol to 12.5 mg twice daily 2. TAKE Furosemide ONLY AS NEEDED for swelling 3. START over-the-counter Folic acid - take 1 mg by mouth daily  Your physician recommends that you return for lab work TODAY.  Dr Sallyanne Kuster recommends that you  schedule a follow-up appointment in 2-3  months.  If you need a refill on your cardiac medications before your next appointment, please call your pharmacy.    Signed, Sanda Klein, MD  06/11/2017 6:40 PM    [facility] [place], [place], [place]  [address] Phone: [phone]; Fax: [phone]

## 2017-06-11 NOTE — Patient Instructions (Signed)
Dr Luane School has recommended making the following medication changes: 1. DECREASE Metoprolol to 12.5 mg twice daily 2. TAKE Furosemide ONLY AS NEEDED for swelling 3. START over-the-counter Folic acid - take 1 mg by mouth daily  Your physician recommends that you return for lab work TODAY.  Dr Sallyanne Kuster recommends that you schedule a follow-up appointment in 2-3 months.  If you need a refill on your cardiac medications before your next appointment, please call your pharmacy.

## 2017-06-12 ENCOUNTER — Encounter (HOSPITAL_COMMUNITY): Payer: Self-pay

## 2017-06-12 ENCOUNTER — Emergency Department (HOSPITAL_COMMUNITY)
Admission: EM | Admit: 2017-06-12 | Discharge: 2017-06-12 | Disposition: A | Discharge status: Home / Self Care | Payer: Medicare Other | Attending: Emergency Medicine | Admitting: Emergency Medicine

## 2017-06-12 ENCOUNTER — Emergency Department (HOSPITAL_COMMUNITY): Payer: Medicare Other

## 2017-06-12 ENCOUNTER — Other Ambulatory Visit: Payer: Self-pay

## 2017-06-12 DIAGNOSIS — E119 Type 2 diabetes mellitus without complications: Secondary | ICD-10-CM | POA: Insufficient documentation

## 2017-06-12 DIAGNOSIS — N289 Disorder of kidney and ureter, unspecified: Secondary | ICD-10-CM

## 2017-06-12 DIAGNOSIS — L7622 Postprocedural hemorrhage and hematoma of skin and subcutaneous tissue following other procedure: Secondary | ICD-10-CM | POA: Insufficient documentation

## 2017-06-12 DIAGNOSIS — D649 Anemia, unspecified: Secondary | ICD-10-CM | POA: Diagnosis not present

## 2017-06-12 DIAGNOSIS — Z7982 Long term (current) use of aspirin: Secondary | ICD-10-CM | POA: Insufficient documentation

## 2017-06-12 DIAGNOSIS — I130 Hypertensive heart and chronic kidney disease with heart failure and stage 1 through stage 4 chronic kidney disease, or unspecified chronic kidney disease: Secondary | ICD-10-CM | POA: Insufficient documentation

## 2017-06-12 DIAGNOSIS — Z79899 Other long term (current) drug therapy: Secondary | ICD-10-CM | POA: Insufficient documentation

## 2017-06-12 DIAGNOSIS — Z4889 Encounter for other specified surgical aftercare: Secondary | ICD-10-CM

## 2017-06-12 DIAGNOSIS — I5032 Chronic diastolic (congestive) heart failure: Secondary | ICD-10-CM | POA: Diagnosis not present

## 2017-06-12 DIAGNOSIS — N183 Chronic kidney disease, stage 3 (moderate): Secondary | ICD-10-CM | POA: Diagnosis not present

## 2017-06-12 DIAGNOSIS — Z7902 Long term (current) use of antithrombotics/antiplatelets: Secondary | ICD-10-CM | POA: Insufficient documentation

## 2017-06-12 LAB — BASIC METABOLIC PANEL
ANION GAP: 12 (ref 5–15)
BUN/Creatinine Ratio: 22 (ref 12–28)
BUN: 26 mg/dL — ABNORMAL HIGH (ref 6–20)
BUN: 24 mg/dL (ref 8–27)
CALCIUM: 9.5 mg/dL (ref 8.7–10.3)
CHLORIDE: 101 mmol/L (ref 101–111)
CO2: 20 mmol/L (ref 20–29)
CO2: 25 mmol/L (ref 22–32)
CREATININE: 1.1 mg/dL — AB (ref 0.57–1.00)
Calcium: 9.6 mg/dL (ref 8.9–10.3)
Chloride: 102 mmol/L (ref 96–106)
Creatinine, Ser: 1.28 mg/dL — ABNORMAL HIGH (ref 0.44–1.00)
GFR calc Af Amer: 55 mL/min/{1.73_m2} — ABNORMAL LOW (ref >59)
GFR calc non Af Amer: 39 mL/min — ABNORMAL LOW (ref >60)
GFR, EST AFRICAN AMERICAN: 45 mL/min — AB (ref >60)
GFR, EST NON AFRICAN AMERICAN: 48 mL/min/{1.73_m2} — AB (ref >59)
GLUCOSE: 146 mg/dL — AB (ref 65–99)
GLUCOSE: 194 mg/dL — AB (ref 65–99)
POTASSIUM: 4.3 mmol/L (ref 3.5–5.2)
Potassium: 4.4 mmol/L (ref 3.5–5.1)
Sodium: 138 mmol/L (ref 135–145)
Sodium: 141 mmol/L (ref 134–144)

## 2017-06-12 LAB — CBC
HEMATOCRIT: 30.3 % — AB (ref 36.0–46.0)
HEMOGLOBIN: 10 g/dL — AB (ref 11.1–15.9)
HEMOGLOBIN: 9.4 g/dL — AB (ref 12.0–15.0)
Hematocrit: 31.4 % — ABNORMAL LOW (ref 34.0–46.6)
MCH: 30.3 pg (ref 26.0–34.0)
MCH: 30.7 pg (ref 26.6–33.0)
MCHC: 31 g/dL (ref 30.0–36.0)
MCHC: 31.8 g/dL (ref 31.5–35.7)
MCV: 96 fL (ref 79–97)
MCV: 97.7 fL (ref 78.0–100.0)
Platelets: 267 10*3/uL (ref 150–400)
Platelets: 281 10*3/uL (ref 150–379)
RBC: 3.1 MIL/uL — ABNORMAL LOW (ref 3.87–5.11)
RBC: 3.26 x10E6/uL — AB (ref 3.77–5.28)
RDW: 14.9 % (ref 11.5–15.5)
RDW: 14.8 % (ref 12.3–15.4)
WBC: 7.8 10*3/uL (ref 4.0–10.5)
WBC: 8.3 10*3/uL (ref 3.4–10.8)

## 2017-06-12 LAB — I-STAT CG4 LACTIC ACID, ED: Lactic Acid, Venous: 1.2 mmol/L (ref 0.5–1.9)

## 2017-06-12 MED ORDER — IOPAMIDOL (ISOVUE-300) INJECTION 61%
INTRAVENOUS | Status: AC
  Administered 2017-06-12: 100 mL
  Filled 2017-06-12: qty 100

## 2017-06-12 NOTE — ED Provider Notes (Signed)
[facility] EMERGENCY DEPARTMENT Provider Note   CSN: [id number] Arrival date & time: 06/12/17  0023     History   Chief Complaint Chief Complaint  Patient presents with  . Wound Check    HPI Carrie Monroe is a 82 y.o. female.  The history is provided by the patient.  She has history of coronary artery disease, chronic kidney disease, diabetes, hyperlipidemia, hypertension and had complication from TAVR with seroma and retroperitoneal hematoma.  Wound VAC was removed 1 week ago.  She was doing well until tonight when she noted a large amount of bloody drainage.  She denies any pain or fever.  She denies any recent trauma.  Past Medical History:  Diagnosis Date  . Aortic stenosis, severe    a. 01/2017: s/p TAVR; hospital course complicated by large groin hematoma/wound  . Arthritis   . Breast cancer ([place])    a. L breast cancer s/p lumpectomy and XRT  . Carotid artery stenosis    a. 11/2016: 80-99% RICA stenosis, 1-39% vs low range 14-43% LICA   . Chronic diastolic CHF (congestive heart failure) ([place])   . CKD (chronic kidney disease)   . Coronary artery disease    a. 11/2016: diagnosed with multivessel CAD, turned down for CABG and underwent PCI/DES to mLCx, PCI/DES to mLAD and PCTA of ostial diagonal on 12/28/16  . Diabetes mellitus    a. diet controlled   . Exogenous obesity   . Gout   . Hemorrhoids   . History of kidney stones   . Hyperlipidemia   . Hypertension   . 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 Hypertension   . Hyperlipidemia   . History of kidney stones   . Hemorrhoids   . Exogenous obesity   . CKD (chronic kidney disease)   . Chronic diastolic CHF (congestive heart failure) ([place])   . Breast cancer ([place])   . Arthritis   . Aortic stenosis,  severe   . Wound infection 04/03/2017  . Acute respiratory failure with hypoxia ([place])   . Hemorrhagic shock ([place])   . 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 Carotid artery stenosis   . Coronary artery disease   . Chronic renal insufficiency, stage III (moderate) ([place]) 12/22/2016  . Dental abscess- s/p multiple tooth extraction 12/21/16 12/22/2016  . Essential hypertension 01/27/2016  . Morbid obesity due to excess calories ([place]) 01/27/2016  . Gout 06/22/2013  . Dyslipidemia 05/08/2011  . Diabetes mellitus type 2 in obese ([place]) 05/08/2011  . Chronic diastolic heart failure ([place]) 05/08/2011  . Severe aortic stenosis 05/08/2011  . Breast cancer of upper-outer quadrant of left female breast ([place]) 03/16/2011    Past Surgical History:  Procedure Laterality Date  . APPLICATION OF WOUND VAC Left 02/05/2017   Procedure: APPLICATION OF WOUND VAC;  Surgeon: Serafina Mitchell, MD;  Location: [facility];  Service: Vascular;  Laterality: Left;  . APPLICATION OF WOUND VAC Left 02/22/2017   Procedure: APPLICATION OF WOUND VAC LEFT GROIN;  Surgeon: Serafina Mitchell, MD;  Location: [facility];  Service: Vascular;  Laterality: Left;  . APPLICATION OF WOUND VAC Left 04/04/2017   Procedure: APPLICATION OF WOUND VAC;  Surgeon: Serafina Mitchell, MD;  Location: [facility];  Service: Vascular;  Laterality: Left;  . BREAST  LUMPECTOMY  2009   left  . BREAST LUMPECTOMY WITH NEEDLE LOCALIZATION Left 01/06/2015   Procedure: BREAST BIOPSY WITH NEEDLE LOCALIZATION AND SKIN BIOPSY;  Surgeon: Autumn Messing III, MD;  Location: [facility];  Service: General;  Laterality: Left;  . CATARACT EXTRACTION    . CHOLECYSTECTOMY  2010  . CORONARY STENT INTERVENTION N/A 12/28/2016   Procedure: Coronary Stent Intervention;  Surgeon: Burnell Blanks, MD;  Location: [facility];  Service: Cardiovascular;  Laterality: N/A;  . EYE SURGERY     BILATERAL CATARACT EXTRACTIONS AND LENS IMPLANTS  . FEMORAL  ARTERY EXPLORATION N/A 02/05/2017   Procedure: Evacuation of Retroperitoneal Hematoma and Primary Repair of Femoral Artery and FEMORAL ARTERY EXPLORATION;  Surgeon: Serafina Mitchell, MD;  Location: [facility];  Service: Vascular;  Laterality: N/A;  . HEMATOMA EVACUATION Left 02/08/2017   Procedure: EVACUATION HEMATOMA;  Surgeon: Rosetta Posner, MD;  Location: [facility];  Service: Vascular;  Laterality: Left;  . I&D EXTREMITY Left 02/22/2017   Procedure: [place];  Surgeon: Serafina Mitchell, MD;  Location: [facility];  Service: Vascular;  Laterality: Left;  . I&D EXTREMITY Left 04/04/2017   Procedure: [name];  Surgeon: Serafina Mitchell, MD;  Location: [facility];  Service: Vascular;  Laterality: Left;  . KNEE SURGERY     right PARTIAL KNEE REPLACEMENT  . LEFT HEART CATH AND CORONARY ANGIOGRAPHY N/A 12/17/2016   Procedure: Left Heart Cath and Coronary Angiography;  Surgeon: Burnell Blanks, MD;  Location: [facility];  Service: Cardiovascular;  Laterality: N/A;  . MULTIPLE EXTRACTIONS WITH ALVEOLOPLASTY N/A 12/21/2016   Procedure: Extraction of tooth #'s 12, 23,24,25,26 and 29 with alveoloplasty and gross debridement of remaining teeth.;  Surgeon: Lenn Cal, DDS;  Location: [facility];  Service: Oral Surgery;  Laterality: N/A;  . SHOULDER SURGERY     right  . SKIN GRAFT TO RIGHT HAND     AFTER BURN TO THE HAND  . TEE WITHOUT CARDIOVERSION N/A 02/05/2017   Procedure: TRANSESOPHAGEAL ECHOCARDIOGRAM (TEE);  Surgeon: Burnell Blanks, MD;  Location: [facility];  Service: Open Heart Surgery;  Laterality: N/A;  . TOTAL KNEE ARTHROPLASTY Left 08/03/2013   Procedure: TOTAL LEFT KNEE ARTHROPLASTY;  Surgeon: Gearlean Alf, MD;  Location: [facility];  Service: Orthopedics;  Laterality: Left;  . TRANSCATHETER AORTIC VALVE REPLACEMENT, TRANSFEMORAL N/A 02/05/2017   Procedure: TRANSCATHETER AORTIC VALVE REPLACEMENT, TRANSFEMORAL;  Surgeon: Burnell Blanks, MD;  Location: [facility];  Service: Open Heart Surgery;  Laterality: N/A;  . US ECHOCARDIOGRAPHY  05/15/2010   EF 60-65%    OB History    No data available       Home Medications    Prior to Admission medications   Medication Sig Start Date End Date Taking? Authorizing Provider  allopurinol (ZYLOPRIM) 300 MG tablet Take 300 mg by mouth daily as needed (gout).    [provider]  aspirin 81 MG chewable tablet Chew 1 tablet (81 mg total) by mouth daily. 12/23/16   Erlene Quan, PA-C  Cholecalciferol (VITAMIN D PO) Take 1 capsule by mouth daily.    [provider]  clopidogrel (PLAVIX) 75 MG tablet Take 1 tablet (75 mg total) by mouth daily with breakfast. 12/30/16   Eileen Stanford, PA-C  colchicine 0.6 MG tablet Take 0.6 mg by mouth every 8 (eight) hours as needed (gout flare).    [provider]  COLLAGEN  PO Take by mouth. power    [provider]  ferrous sulfate (FERROUSUL) 325 (65 FE) MG tablet Take 1 tablet (325 mg total) 3 (three) times daily with meals by mouth. 04/05/17   Doreatha Lew, MD  furosemide (LASIX) 40 MG tablet Take 1 tablet (40 mg total) by mouth daily as needed for fluid or edema. 06/11/17   Croitoru, Mihai, MD  hydrALAZINE (APRESOLINE) 50 MG tablet Take 50 mg 2 (two) times daily by mouth.     [provider]  metoprolol tartrate (LOPRESSOR) 25 MG tablet Take 0.5 tablets (12.5 mg total) by mouth 2 (two) times daily. 06/11/17   Croitoru, Dani Gobble, MD  Misc Natural Products (FIBER 7 PO) Take by mouth. Hemp power    [provider]  rosuvastatin (CRESTOR) 10 MG tablet Take 10 mg by mouth daily.  01/17/16   [provider]  tamoxifen (NOLVADEX) 20 MG tablet Take 1 tablet by mouth every morning. 03/26/16   Nicholas Lose, MD    Family History Family History  Problem Relation Age of Onset  . Cancer Mother        pancreatic  . Heart disease Father   . Hypertension Father   . Cancer Brother   . Diabetes  Sister     Social History Social History   Tobacco Use  . Smoking status: Never Smoker  . Smokeless tobacco: Never Used  Substance Use Topics  . Alcohol use: No  . Drug use: No     Allergies   Patient has no known allergies.   Review of Systems Review of Systems  All other systems reviewed and are negative.    Physical Exam Updated Vital Signs BP (!) 150/60 (BP Location: Right Arm)   Pulse 76   Temp 98 F (36.7 C) (Oral)   Resp 18   Ht 5\' 3"  (1.6 m)   Wt 102.1 kg (225 lb)   SpO2 99%   BMI 39.86 kg/m   Physical Exam  Nursing note and vitals reviewed.  82 year old female, resting comfortably and in no acute distress. Vital signs are significant for hypertension. Oxygen saturation is 99%, which is normal. Head is normocephalic and atraumatic. PERRLA, EOMI. Oropharynx is clear. Neck is nontender and supple without adenopathy or JVD. Back is nontender and there is no CVA tenderness. Lungs are clear without rales, wheezes, or rhonchi. Chest is nontender. Heart has regular rate and rhythm with 2/6 systolic ejection murmur best heard at the upper left and upper right sternal border. Abdomen is soft, flat, nontender without masses or hepatosplenomegaly and peristalsis is normoactive. Extremities have no cyanosis or edema, full range of motion is present.  Left groin has a healing wound with healthy-appearing skin and a few areas of punctate bleeding.  There is no obvious mass and no obvious drainage site. Skin is warm and dry without rash. Neurologic: Mental status is normal, cranial nerves are intact, there are no motor or sensory deficits.  ED Treatments / Results  Labs (all labs ordered are listed, but only abnormal results are displayed) Labs Reviewed  CBC - Abnormal; Notable for the following components:      Result Value   RBC 3.10 (*)    Hemoglobin 9.4 (*)    HCT 30.3 (*)    All other components within normal limits  BASIC METABOLIC PANEL - Abnormal;  Notable for the following components:   Glucose, Bld 194 (*)    BUN 26 (*)    Creatinine, Ser  1.28 (*)    GFR calc non Af Amer 39 (*)    GFR calc Af Amer 45 (*)    All other components within normal limits  I-STAT CG4 LACTIC ACID, ED    Radiology Ct Abdomen Pelvis W Contrast  Result Date: 06/12/2017 CLINICAL DATA:  82 y/o F; abdominal infection. History of infected wound post TAVR and retroperitoneal hematoma. Bleeding from left inguinal wound. EXAM: CT ABDOMEN AND PELVIS WITH CONTRAST TECHNIQUE: Multidetector CT imaging of the abdomen and pelvis was performed using the standard protocol following bolus administration of intravenous contrast. CONTRAST:  194mL ISOVUE-300 IOPAMIDOL (ISOVUE-300) INJECTION 61% COMPARISON:  02/13/2017 CT abdomen and pelvis. 01/10/2017 CT chest abdomen and pelvis. FINDINGS: Lower chest: Stented aortic valve replacement. Coronary artery and mitral annular calcification. Hepatobiliary: Right lobe of liver calcified granuloma. No other focal liver abnormality is seen. Status post cholecystectomy. No biliary dilatation. Pancreas: Unremarkable. No pancreatic ductal dilatation or surrounding inflammatory changes. Spleen: Normal in size without focal abnormality. Adrenals/Urinary Tract: Normal adrenal glands. Multiple renal cysts measuring up to 20 mm in right interpolar kidney. Punctate nonobstructive nephrolithiasis. No left hydronephrosis. Mild right proximal hydroureter without obstructing stone or mass identified. Normal bladder. Stomach/Bowel: Stomach is within normal limits. Appendix not identified, no pericecal inflammation. No evidence of bowel wall thickening, distention, or inflammatory changes. Mild sigmoid diverticulosis. Vascular/Lymphatic: Aortic atherosclerosis. No enlarged abdominal or pelvic lymph nodes. Reproductive: Uterus and bilateral adnexa are unremarkable. Other: Left inguinal dermal thickening and mild fat stranding compatible with history of wound. No rim  enhancing fluid collection. Near complete resolution of left groin retroperitoneal hematoma. Stable partially visualized nodule and postsurgical changes of left breast. Stable left paramedian lower anterior abdominal wall broad-based hernia containing fat and bowel without obstruction or inflammation. Musculoskeletal: No fracture is seen. Severe lumbar spine degenerative changes and mild osteoarthrosis of hip joints. IMPRESSION: 1. Left inguinal dermal thickening and mild inflammation of subcutaneous fat compatible history of wound, possibly infected. No discrete abscess identified. 2. Near complete resolution of left groin/retroperitoneal hematoma. 3. Nonobstructive bilateral nephrolithiasis. 4. Additional stable chronic findings as above. Electronically Signed   By: Kristine Garbe M.D.   On: 06/12/2017 05:38    Procedures Procedures (including critical care time)  Medications Ordered in ED Medications  iopamidol (ISOVUE-300) 61 % injection (100 mLs  Contrast Given 06/12/17 0456)     Initial Impression / Assessment and Plan / ED Course  I have reviewed the triage vital signs and the nursing notes.  Pertinent labs & imaging results that were available during my care of the patient were reviewed by me and considered in my medical decision making (see chart for details).  Postoperative wound of the left groin which has had problems with complications of seroma and retroperitoneal hematoma.  Current exam appears benign.  Hemoglobin has actually risen from last recorded value.  However, given her history, will send for CT to look for evidence of any internal fluid collection.  Old records are reviewed confirming complicated course as noted above.  CT scan shows no collection of fluid such as seroma or hematoma, no evidence of abscess.  She has not had any further bleeding.  Because of the bleeding at home is not clear, but wound appears to be healing appropriately on exam here.  She is to  continue her routine postoperative wound care and follow-up with her surgeon.  Final Clinical Impressions(s) / ED Diagnoses   Final diagnoses:  Encounter for post surgical wound check  Renal insufficiency  Normochromic normocytic anemia    ED Discharge Orders    None       Delora Fuel, MD 65/78/46 [phone]

## 2017-06-12 NOTE — ED Triage Notes (Signed)
Pt states that she has a wound in the crease of her groin, had a wound vac on it that was taken off last week, tonight wound began to bleed. Denies fevers

## 2017-06-12 NOTE — ED Notes (Signed)
Dressing changed to left lower abd.

## 2017-06-12 NOTE — Discharge Instructions (Signed)
Continue with your wound care regimen. Return if you are having any problems.

## 2017-06-12 NOTE — ED Notes (Signed)
Patient presents to ed c/o bleeding from wound on left groin area. States she had AVR in sept. And states her femoral artery ruptured. She had a wound vac. That was removed 1/7. Hasn't had any problems was seen by her MD yest. States approx. 11pm last she noticed something wet on her PJ"s stated she was bleeding from wound. New bandage placed and she hasn't bled through

## 2017-06-14 ENCOUNTER — Telehealth: Payer: Self-pay | Admitting: *Deleted

## 2017-06-14 NOTE — Telephone Encounter (Signed)
Called patient to affirm the wound vac had been removed. Patient states that it was removed and home health is seeing her three times weekly dressing the wound with xeroform qauze.

## 2017-06-26 ENCOUNTER — Emergency Department (HOSPITAL_COMMUNITY): Payer: Medicare Other

## 2017-06-26 ENCOUNTER — Emergency Department (HOSPITAL_COMMUNITY)
Admission: EM | Admit: 2017-06-26 | Discharge: 2017-06-26 | Disposition: A | Discharge status: Home / Self Care | Payer: Medicare Other | Attending: Emergency Medicine | Admitting: Emergency Medicine

## 2017-06-26 ENCOUNTER — Encounter (HOSPITAL_COMMUNITY): Payer: Self-pay

## 2017-06-26 ENCOUNTER — Other Ambulatory Visit: Payer: Self-pay

## 2017-06-26 ENCOUNTER — Telehealth: Payer: Self-pay | Admitting: *Deleted

## 2017-06-26 DIAGNOSIS — Z7902 Long term (current) use of antithrombotics/antiplatelets: Secondary | ICD-10-CM | POA: Diagnosis not present

## 2017-06-26 DIAGNOSIS — R918 Other nonspecific abnormal finding of lung field: Secondary | ICD-10-CM

## 2017-06-26 DIAGNOSIS — Z7982 Long term (current) use of aspirin: Secondary | ICD-10-CM | POA: Diagnosis not present

## 2017-06-26 DIAGNOSIS — Z79899 Other long term (current) drug therapy: Secondary | ICD-10-CM | POA: Diagnosis not present

## 2017-06-26 DIAGNOSIS — I5032 Chronic diastolic (congestive) heart failure: Secondary | ICD-10-CM | POA: Diagnosis not present

## 2017-06-26 DIAGNOSIS — D381 Neoplasm of uncertain behavior of trachea, bronchus and lung: Secondary | ICD-10-CM | POA: Diagnosis not present

## 2017-06-26 DIAGNOSIS — I2510 Atherosclerotic heart disease of native coronary artery without angina pectoris: Secondary | ICD-10-CM | POA: Diagnosis not present

## 2017-06-26 DIAGNOSIS — I130 Hypertensive heart and chronic kidney disease with heart failure and stage 1 through stage 4 chronic kidney disease, or unspecified chronic kidney disease: Secondary | ICD-10-CM | POA: Insufficient documentation

## 2017-06-26 DIAGNOSIS — I97618 Postprocedural hemorrhage and hematoma of a circulatory system organ or structure following other circulatory system procedure: Secondary | ICD-10-CM | POA: Insufficient documentation

## 2017-06-26 DIAGNOSIS — E119 Type 2 diabetes mellitus without complications: Secondary | ICD-10-CM | POA: Insufficient documentation

## 2017-06-26 DIAGNOSIS — Z853 Personal history of malignant neoplasm of breast: Secondary | ICD-10-CM | POA: Diagnosis not present

## 2017-06-26 DIAGNOSIS — R05 Cough: Secondary | ICD-10-CM | POA: Diagnosis not present

## 2017-06-26 DIAGNOSIS — N189 Chronic kidney disease, unspecified: Secondary | ICD-10-CM | POA: Insufficient documentation

## 2017-06-26 DIAGNOSIS — E785 Hyperlipidemia, unspecified: Secondary | ICD-10-CM | POA: Insufficient documentation

## 2017-06-26 DIAGNOSIS — Z5189 Encounter for other specified aftercare: Secondary | ICD-10-CM

## 2017-06-26 DIAGNOSIS — R059 Cough, unspecified: Secondary | ICD-10-CM

## 2017-06-26 NOTE — ED Notes (Signed)
Spoke with Dr. Trula Slade regarding pt visit to the emergency dept. No orders received at this time and Dr. Trula Slade would like to be paged once pt is in a room so he can come see her.

## 2017-06-26 NOTE — ED Notes (Signed)
Phleb consulted to draw labs.

## 2017-06-26 NOTE — Discharge Instructions (Addendum)
Please read attached information. If you experience any new or worsening signs or symptoms please return to the emergency room for evaluation. Please follow-up with your primary care provider or specialist as discussed.  It is very important that you follow-up with pulmonology given your findings of lung mass with concern for cancerous etiology.  Please follow-up immediately for further evaluation and management.  Please follow-up with your primary care provider for ongoing management of your cough.

## 2017-06-26 NOTE — ED Notes (Signed)
Wound bandaged with tefla and abd pad.

## 2017-06-26 NOTE — ED Notes (Signed)
CT taking pt from A11 for imaging. Bringing pt to room once finished.

## 2017-06-26 NOTE — ED Notes (Signed)
Dr. Trula Slade paged per RN Chads request

## 2017-06-26 NOTE — ED Notes (Signed)
Prior to discharging pt out of room. Merry Proud, [place], stated he needed to discuss CT findings with pt. Still at bedside at this time.

## 2017-06-26 NOTE — Telephone Encounter (Signed)
Call from patient's [place] Nurse. Increasing bleeding at groin. Has had a "massive blow out " at this site in the past and feels it is likely to occur again. Pressure dressing applied to site but continues to leak. After speaking with this nurse and patient decided best action is to go to the ER for treatment. Friend is to transport patient.  [facility] triage nurse called.Spoke to [employer] .

## 2017-06-26 NOTE — ED Triage Notes (Signed)
Pt sent in by Dr. Stephens Shire office d/t drainage from wound in left groin and concern for infection/ rupture. PT reports having wound vac removed 06/03/2017.

## 2017-06-26 NOTE — Progress Notes (Signed)
Patient name: Carrie Monroe MRN: [id number] DOB: 01-May-1936 Sex: female    HISTORY OF PRESENT ILLNESS:   Carrie Monroe is a 82 y.o. female who was told to come to the ER for evaluation of her left groin.  She has a history of TAVR which was complicated by bleeding from the left groin requiring operative repair.  She had a large retroperitoneal hematoma which required wound VAC and dressing changes in order to heal.  She has recently had 2 episodes of significant drainage from the wound.  She was told to come to the emergency room for further evaluation  CURRENT MEDICATIONS:    No current facility-administered medications for this encounter.    Current Outpatient Medications  Medication Sig Dispense Refill  . allopurinol (ZYLOPRIM) 300 MG tablet Take 300 mg by mouth daily.     Marland Kitchen aspirin 81 MG chewable tablet Chew 1 tablet (81 mg total) by mouth daily.    . Cholecalciferol (VITAMIN D PO) Take 1 capsule by mouth daily.    . clopidogrel (PLAVIX) 75 MG tablet Take 1 tablet (75 mg total) by mouth daily with breakfast. 30 tablet 6  . COLLAGEN PO Take by mouth daily. power     . ferrous sulfate (FERROUSUL) 325 (65 FE) MG tablet Take 1 tablet (325 mg total) 3 (three) times daily with meals by mouth. (Patient taking differently: Take 325 mg by mouth daily with breakfast. ) 90 tablet 0  . furosemide (LASIX) 40 MG tablet Take 1 tablet (40 mg total) by mouth daily as needed for fluid or edema. 90 tablet 1  . hydrALAZINE (APRESOLINE) 50 MG tablet Take 50 mg 2 (two) times daily by mouth.     . metoprolol tartrate (LOPRESSOR) 25 MG tablet Take 0.5 tablets (12.5 mg total) by mouth 2 (two) times daily. 90 tablet 3  . rosuvastatin (CRESTOR) 10 MG tablet Take 10 mg by mouth daily.   0  . tamoxifen (NOLVADEX) 20 MG tablet Take 1 tablet by mouth every morning. 90 tablet 3    REVIEW OF SYSTEMS:   [X]  denotes positive finding, [ ]  denotes negative finding Cardiac   Comments:  Chest pain or chest pressure:    Shortness of breath upon exertion:    Short of breath when lying flat:    Irregular heart rhythm:    Constitutional    Fever or chills:      PHYSICAL EXAM:   Vitals:   06/26/17 1332 06/26/17 1451 06/26/17 1558  BP: (!) 164/67 (!) 168/57 (!) 163/76  Pulse: 69 63 67  Resp: 18 20 16   Temp: 97.9 F (36.6 C)    TempSrc: Oral    SpO2: 100% 97% 98%    GENERAL: The patient is a well-nourished female, in no acute distress. The vital signs are documented above. CARDIOVASCULAR: There is a regular rate and rhythm. PULMONARY: Non-labored respirations The left groin wound measures approximately 7 cm x 1 cm.  It is flush with the skin.  I probed this area with a Q-tip and could not find an area of drainage or any pockets.  I sent her for CT scan which did not show any undrained fluid collections.  STUDIES:   CT scan with the following results: 1. Re-accumulation of medium density fluid in the inferior left pelvis, anteriorly, measuring 6.2 x 2.5 x 1.9 cm. This could represent a recurrent hematoma, infected hematoma or abscess. 2. **An incidental finding of potential clinical significance has been found. 6 mm right  middle lobe nodule, increased in size from 3 mm on 12/18/2016. This is concerning for a possible metastasis or small primary lung neoplasm.** 3. Changes compatible with cirrhosis of the liver with borderline splenomegaly and upper and left mid abdominal varices. 4. Small, nonobstructing bilateral renal calculi. 5. Interval minimal small bowel ileus or partial obstruction.   MEDICAL ISSUES:   Left groin wound: I did not see any undrained fluid collections both on physical exam and by CT scan.  I suspect she has had bleeding from the raw surface areas as well as some lymphatic drainage however there is no cavity that I can identify.  For that reason I have recommended replacing the wound VAC to see if we can get this wound to heal  which is up underneath her pannus.  I will arrange wound VAC placement as an outpatient.  The ER discussed the other ancillary findings on CT scan.  [name], MD [facility] [phone] Pager [phone]

## 2017-06-26 NOTE — ED Provider Notes (Signed)
[facility] EMERGENCY DEPARTMENT Provider Note   CSN: [id number] Arrival date & time: 06/26/17  1306     History   Chief Complaint Chief Complaint  Patient presents with  . Wound Check    HPI Carrie Monroe is a 82 y.o. female.  HPI    82 year old female with a history of CAD, status post PCI, aortic stenosis status post T AVR, chronic diastolic heart failure, hypertension, hyperlipidemia,, obesity, diabetes presents today at the request of vascular surgery clinic for evaluation of groin bleeding.  Vascular  requested room to evaluate patient here in the ED.  Patient notes that she has had drainage and bleeding from the left lower groin, she has had a history of this in the past with significant blood loss.  Patient also notes that she is had a cough over the last week, she just recently finished taking antibiotics for this.  She denies any fever.  Patient notes normal bowel movements.  She denies any history of cancer, denies any significant weight loss or night sweats.  She does report that she has been fatigued more recently due to low hemoglobin secondary to bleeding.   Past Medical History:  Diagnosis Date  . Aortic stenosis, severe    a. 01/2017: s/p TAVR; hospital course complicated by large groin hematoma/wound  . Arthritis   . Breast cancer ([place])    a. L breast cancer s/p lumpectomy and XRT  . Carotid artery stenosis    a. 11/2016: 80-99% RICA stenosis, 1-39% vs low range 27-06% LICA   . Chronic diastolic CHF (congestive heart failure) ([place])   . CKD (chronic kidney disease)   . Coronary artery disease    a. 11/2016: diagnosed with multivessel CAD, turned down for CABG and underwent PCI/DES to mLCx, PCI/DES to mLAD and PCTA of ostial diagonal on 12/28/16  . Diabetes mellitus    a. diet controlled   . Exogenous obesity   . Gout   . Hemorrhoids   . History of kidney stones   . Hyperlipidemia   . Hypertension   . S/P TAVR (transcatheter aortic  valve replacement) 02/05/2017   26 mm Medtronic CorValve Evolut Pro transcatheter heart valve placed via percutaneous left transfemoral approach     Patient Active Problem List   Diagnosis Date Noted  . Hypertension   . Hyperlipidemia   . History of kidney stones   . Hemorrhoids   . Exogenous obesity   . CKD (chronic kidney disease)   . Chronic diastolic CHF (congestive heart failure) ([place])   . Breast cancer ([place])   . Arthritis   . Aortic stenosis, severe   . Wound infection 04/03/2017  . Acute respiratory failure with hypoxia ([place])   . Hemorrhagic shock ([place])   . 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 Carotid artery stenosis   . Coronary artery disease   . Chronic renal insufficiency, stage III (moderate) ([place]) 12/22/2016  . Dental abscess- s/p multiple tooth extraction 12/21/16 12/22/2016  . Essential hypertension 01/27/2016  . Morbid obesity due to excess calories ([place]) 01/27/2016  . Gout 06/22/2013  . Dyslipidemia 05/08/2011  . Diabetes mellitus type 2 in obese ([place]) 05/08/2011  . Chronic diastolic heart failure ([place]) 05/08/2011  . Severe aortic stenosis 05/08/2011  . Breast cancer of upper-outer quadrant of left female breast ([place]) 03/16/2011    Past Surgical History:  Procedure Laterality Date  . APPLICATION OF WOUND VAC Left 02/05/2017   Procedure: APPLICATION OF WOUND VAC;  Surgeon: Serafina Mitchell, MD;  Location: [facility] OR;  Service: Vascular;  Laterality: Left;  . APPLICATION OF WOUND VAC Left 02/22/2017   Procedure: APPLICATION OF WOUND VAC LEFT GROIN;  Surgeon: Serafina Mitchell, MD;  Location: [facility];  Service: Vascular;  Laterality: Left;  . APPLICATION OF WOUND VAC Left 04/04/2017   Procedure: APPLICATION OF WOUND VAC;  Surgeon: Serafina Mitchell, MD;  Location: [facility];  Service: Vascular;  Laterality: Left;  . BREAST LUMPECTOMY  2009   left  . BREAST LUMPECTOMY WITH NEEDLE LOCALIZATION Left 01/06/2015   Procedure: BREAST BIOPSY WITH NEEDLE  LOCALIZATION AND SKIN BIOPSY;  Surgeon: Autumn Messing III, MD;  Location: [facility];  Service: General;  Laterality: Left;  . CATARACT EXTRACTION    . CHOLECYSTECTOMY  2010  . CORONARY STENT INTERVENTION N/A 12/28/2016   Procedure: Coronary Stent Intervention;  Surgeon: Burnell Blanks, MD;  Location: [facility];  Service: Cardiovascular;  Laterality: N/A;  . EYE SURGERY     BILATERAL CATARACT EXTRACTIONS AND LENS IMPLANTS  . FEMORAL ARTERY EXPLORATION N/A 02/05/2017   Procedure: Evacuation of Retroperitoneal Hematoma and Primary Repair of Femoral Artery and FEMORAL ARTERY EXPLORATION;  Surgeon: Serafina Mitchell, MD;  Location: [facility];  Service: Vascular;  Laterality: N/A;  . HEMATOMA EVACUATION Left 02/08/2017   Procedure: EVACUATION HEMATOMA;  Surgeon: Rosetta Posner, MD;  Location: [facility];  Service: Vascular;  Laterality: Left;  . I&D EXTREMITY Left 02/22/2017   Procedure: [place];  Surgeon: Serafina Mitchell, MD;  Location: [facility];  Service: Vascular;  Laterality: Left;  . I&D EXTREMITY Left 04/04/2017   Procedure: [place];  Surgeon: Serafina Mitchell, MD;  Location: [facility];  Service: Vascular;  Laterality: Left;  . KNEE SURGERY     right PARTIAL KNEE REPLACEMENT  . LEFT HEART CATH AND CORONARY ANGIOGRAPHY N/A 12/17/2016   Procedure: Left Heart Cath and Coronary Angiography;  Surgeon: Burnell Blanks, MD;  Location: [facility];  Service: Cardiovascular;  Laterality: N/A;  . MULTIPLE EXTRACTIONS WITH ALVEOLOPLASTY N/A 12/21/2016   Procedure: Extraction of tooth #'s 12, 23,24,25,26 and 29 with alveoloplasty and gross debridement of remaining teeth.;  Surgeon: Lenn Cal, DDS;  Location: [facility];  Service: Oral Surgery;  Laterality: N/A;  . SHOULDER SURGERY     right  . SKIN GRAFT TO RIGHT HAND     AFTER BURN TO THE HAND  . TEE WITHOUT CARDIOVERSION N/A 02/05/2017   Procedure: TRANSESOPHAGEAL  ECHOCARDIOGRAM (TEE);  Surgeon: Burnell Blanks, MD;  Location: [facility];  Service: Open Heart Surgery;  Laterality: N/A;  . TOTAL KNEE ARTHROPLASTY Left 08/03/2013   Procedure: TOTAL LEFT KNEE ARTHROPLASTY;  Surgeon: Gearlean Alf, MD;  Location: [facility];  Service: Orthopedics;  Laterality: Left;  . TRANSCATHETER AORTIC VALVE REPLACEMENT, TRANSFEMORAL N/A 02/05/2017   Procedure: TRANSCATHETER AORTIC VALVE REPLACEMENT, TRANSFEMORAL;  Surgeon: Burnell Blanks, MD;  Location: [facility];  Service: Open Heart Surgery;  Laterality: N/A;  . US ECHOCARDIOGRAPHY  05/15/2010   EF 60-65%    OB History    No data available       Home Medications    Prior to Admission medications   Medication Sig Start Date End Date Taking? Authorizing Provider  allopurinol (ZYLOPRIM) 300 MG tablet Take 300 mg by mouth daily.    Yes [provider]  aspirin 81 MG chewable tablet Chew 1 tablet (  81 mg total) by mouth daily. 12/23/16  Yes Kilroy, Doreene Burke, PA-C  Cholecalciferol (VITAMIN D PO) Take 1 capsule by mouth daily.   Yes [provider]  clopidogrel (PLAVIX) 75 MG tablet Take 1 tablet (75 mg total) by mouth daily with breakfast. 12/30/16  Yes Eileen Stanford, PA-C  COLLAGEN PO Take by mouth daily. power    Yes [provider]  ferrous sulfate (FERROUSUL) 325 (65 FE) MG tablet Take 1 tablet (325 mg total) 3 (three) times daily with meals by mouth. Patient taking differently: Take 325 mg by mouth daily with breakfast.  04/05/17  Yes Patrecia Pour, Christean Grief, MD  furosemide (LASIX) 40 MG tablet Take 1 tablet (40 mg total) by mouth daily as needed for fluid or edema. 06/11/17  Yes Croitoru, Mihai, MD  hydrALAZINE (APRESOLINE) 50 MG tablet Take 50 mg 2 (two) times daily by mouth.    Yes [provider]  metoprolol tartrate (LOPRESSOR) 25 MG tablet Take 0.5 tablets (12.5 mg total) by mouth 2 (two) times daily. 06/11/17  Yes Croitoru, Mihai, MD  rosuvastatin (CRESTOR) 10 MG tablet  Take 10 mg by mouth daily.  01/17/16  Yes [provider]  tamoxifen (NOLVADEX) 20 MG tablet Take 1 tablet by mouth every morning. 03/26/16  Yes Nicholas Lose, MD    Family History Family History  Problem Relation Age of Onset  . Cancer Mother        pancreatic  . Heart disease Father   . Hypertension Father   . Cancer Brother   . Diabetes Sister     Social History Social History   Tobacco Use  . Smoking status: Never Smoker  . Smokeless tobacco: Never Used  Substance Use Topics  . Alcohol use: No  . Drug use: No     Allergies   Patient has no known allergies.   Review of Systems Review of Systems  All other systems reviewed and are negative.    Physical Exam Updated Vital Signs BP (!) 163/76 (BP Location: Right Arm)   Pulse 67   Temp 97.9 F (36.6 C) (Oral)   Resp 16   SpO2 98%   Physical Exam  Constitutional: She is oriented to person, place, and time. She appears well-developed and well-nourished.  HENT:  Head: Normocephalic and atraumatic.  Eyes: Conjunctivae are normal. Pupils are equal, round, and reactive to light. Right eye exhibits no discharge. Left eye exhibits no discharge. No scleral icterus.  Neck: Normal range of motion. No JVD present. No tracheal deviation present.  Pulmonary/Chest: Effort normal and breath sounds normal. No stridor. No respiratory distress. She has no wheezes. She has no rales. She exhibits no tenderness.  Musculoskeletal:  Left groin with minimal drainage and bleeding  Neurological: She is alert and oriented to person, place, and time. Coordination normal.  Psychiatric: She has a normal mood and affect. Her behavior is normal. Judgment and thought content normal.  Nursing note and vitals reviewed.    ED Treatments / Results  Labs (all labs ordered are listed, but only abnormal results are displayed) Labs Reviewed - No data to display  EKG  EKG Interpretation None       Radiology Ct Abdomen Pelvis Wo  Contrast  Result Date: 06/26/2017 CLINICAL DATA:  Bleeding from the left groin. Wound VAC removed on 06/03/2017, placed for an infected wound. The patient had a transcatheter aortic valve replacement on 02/05/2017 with subsequent retroperitoneal hematoma evacuation. History of breast cancer with left lumpectomy and radiation  therapy in 2009. EXAM: CT ABDOMEN AND PELVIS WITHOUT CONTRAST TECHNIQUE: Multidetector CT imaging of the abdomen and pelvis was performed following the standard protocol without IV contrast. COMPARISON:  06/12/2017, 06/12/2017, 02/13/2017 and 12/18/2016. FINDINGS: Lower chest: Right lower lobe atelectasis with minimal improvement. There is also a 6 mm right middle lobe nodule on image number 23 series 5 which is increased in size from 3 mm on 12/18/2016. Hepatobiliary: Cholecystectomy clips. Small calcified liver granulomata. The lateral segment of the left lobe and caudate lobe of the liver are mildly enlarged and there are mildly nodular liver contours. Pancreas: Unremarkable. No pancreatic ductal dilatation or surrounding inflammatory changes. Spleen: Borderline enlarged, without significant change. Adrenals/Urinary Tract: 3 mm mid left renal calculus and additional tiny mid left renal calculus. 6 mm linear lower pole right renal calculus. Stable right renal cyst. Unremarkable adrenal glands, ureters and urinary bladder. Stomach/Bowel: Small number of sigmoid colon diverticula. Normal appearing appendix. Interval minimally dilated small bowel loops. Unremarkable stomach. Vascular/Lymphatic: Atheromatous arterial calcifications. Upper and left mid abdominal varices. No enlarged lymph nodes. Reproductive: Uterus and bilateral adnexa are unremarkable. Other: Mild residual soft tissue thickening and stranding in the left groin and adjacent subcutaneous fat. Interval re-accumulation of medium density fluid in the inferior left pelvis, anteriorly, measuring 6.2 x 1.9 cm on image number 78 of  series 3 and 2.5 cm in orthogonal diameter on and coronal image number 34. Multiple surgical clips are demonstrated in the left inguinal region. Musculoskeletal: Lumbar and lower thoracic spine degenerative changes. Mild to moderate thoracolumbar scoliosis. Previously demonstrated post lumpectomy and postradiation changes in the left breast with coarse, benign-appearing calcifications. IMPRESSION: 1. Re-accumulation of medium density fluid in the inferior left pelvis, anteriorly, measuring 6.2 x 2.5 x 1.9 cm. This could represent a recurrent hematoma, infected hematoma or abscess. 2. **An incidental finding of potential clinical significance has been found. 6 mm right middle lobe nodule, increased in size from 3 mm on 12/18/2016. This is concerning for a possible metastasis or small primary lung neoplasm.** 3. Changes compatible with cirrhosis of the liver with borderline splenomegaly and upper and left mid abdominal varices. 4. Small, nonobstructing bilateral renal calculi. 5. Interval minimal small bowel ileus or partial obstruction. Electronically Signed   By: Claudie Revering M.D.   On: 06/26/2017 16:30    Procedures Procedures (including critical care time)  Medications Ordered in ED Medications - No data to display   Initial Impression / Assessment and Plan / ED Course  I have reviewed the triage vital signs and the nursing notes.  Pertinent labs & imaging results that were available during my care of the patient were reviewed by me and considered in my medical decision making (see chart for details).     Final Clinical Impressions(s) / ED Diagnoses   Final diagnoses:  Visit for wound check  Lung mass  Cough    Labs:   Imaging: CT abdomen pelvis without contrast  Consults:  Therapeutics:  Discharge Meds:   Assessment/Plan: Pt sent from clinic at request of Dr. Trula Slade who request bed for evaluation.  Patient placed in exam room and she was evaluated by physician.    Dr. Trula Slade  reviewed CT scan and felt patient would be safe for outpatient follow-up no further evaluation needed.  Patient will be discharged with outpatient follow-up, strict return precautions.   Incidental findings noted on CT scan include questionable small bowel obstruction patient is having no nausea vomiting or difficulty passing stool.  Patient also had  growth and lung nodule with concern for metastatic or primary disease.  I discussed this with the patient and the need for further evaluation.  Patient reports that she will likely not follow-up with this as she has been through so much over the last year.  I highly encouraged her to follow-up with pulmonology to receive information on what type of treatments or indications would be given after these findings on the CT scan.  Patient agreed that she would about her options and follow-up accordingly.  Patient is given strict return precautions, she verbalized understanding and agreement to today's plan had no further questions or concerns at the time of discharge.     ED Discharge Orders    None       Francee Gentile 06/26/17 1846    Jola Schmidt, MD 06/27/17 1101

## 2017-06-27 ENCOUNTER — Telehealth: Payer: Self-pay | Admitting: Surgery

## 2017-06-27 ENCOUNTER — Telehealth: Payer: Self-pay | Admitting: *Deleted

## 2017-06-27 NOTE — Telephone Encounter (Signed)
Returned call to [facility]  [phone] to discuss wound vac application. Patients vac had been removed earlier and supplies sent back to company.  I call [facility] rep, [phone] and left a detailed message.  I then called the patient to update her on the progress of the wound vac. I will call home health nurse, Manuela Schwartz as soon as I hear from [facility].

## 2017-06-27 NOTE — Telephone Encounter (Signed)
-----   Message from Mena Goes, RN sent at 06/26/2017  5:25 PM EST ----- Regarding: 2 weeks postop   ----- Message ----- From: Serafina Mitchell, MD Sent: 06/26/2017   5:20 PM To: [facility] Charge Pool  Please arrange for home health to replace a wound VAC in the left groin and have her scheduled to see me back in the office in 2 weeks

## 2017-06-27 NOTE — Telephone Encounter (Signed)
Cxl'd 07/15/17 appt w/ [facility]. Sched new appt 07/08/17 at 3:15. Spoke to pt to inform them of appt change.

## 2017-07-08 ENCOUNTER — Encounter: Payer: Self-pay | Admitting: Surgery

## 2017-07-08 ENCOUNTER — Ambulatory Visit: Payer: Medicare Other | Admitting: Surgery

## 2017-07-08 ENCOUNTER — Other Ambulatory Visit: Payer: Self-pay

## 2017-07-08 VITALS — BP 134/61 | HR 68 | Temp 97.1°F | Resp 16 | Ht 63.0 in | Wt 223.0 lb

## 2017-07-08 DIAGNOSIS — K661 Hemoperitoneum: Secondary | ICD-10-CM | POA: Diagnosis not present

## 2017-07-08 DIAGNOSIS — I6521 Occlusion and stenosis of right carotid artery: Secondary | ICD-10-CM | POA: Diagnosis not present

## 2017-07-08 NOTE — Progress Notes (Signed)
HISTORY AND PHYSICAL     CC:  Follow up Requesting Provider:  Marton Redwood, MD  HPI: This is a 82 y.o. female who underwent TAVR on 02/05/17.  There was difficulty with sheath removal and closure device in the left groin, necessitating manual pressure.  The patient was brought to the intensive care unit where she became hypotensive.  She responded to IVF resuscitation.   She did have a left groin hematoma.  She was taken to the operating room that day and underwent evacuation of left retroperitoneal hematoma and primary repair of left femoral artery ruptured pseudoaneurysm and wound vac placement.  She has taken a long time to heal this groin.  She has been to the ER after having 2 episodes of significant drainage from the wound.  She was seen by Dr. Trula Slade on 06/26/17 at which time he did not see any undrained fluid collections both on physical exam and by CT scan.  I suspect she has had bleeding from the raw surface areas as well as some lymphatic drainage however there is no cavity that I can identify.  For that reason I have recommended replacing the wound VAC to see if we can get this wound to heal which is up underneath her pannus.  I will arrange wound VAC placement as an outpatient.  She presents today for follow up.  She states that [facility] has been using silver in her left groin with rolled gauze to help keep the groin clean and dry.  She did not want the wound vac.  She states that she would rather not have the wound vac, but if she needs to have it placed again, she is willing.  She states that the dressing that have been removed for the past 4 times have not been wet.     Past Medical History:  Diagnosis Date  . Aortic stenosis, severe    a. 01/2017: s/p TAVR; hospital course complicated by large groin hematoma/wound  . Arthritis   . Breast cancer ([place])    a. L breast cancer s/p lumpectomy and XRT  . Carotid artery stenosis    a. 11/2016: 80-99% RICA stenosis, 1-39% vs low range 51-02%  LICA   . Chronic diastolic CHF (congestive heart failure) ([place])   . CKD (chronic kidney disease)   . Coronary artery disease    a. 11/2016: diagnosed with multivessel CAD, turned down for CABG and underwent PCI/DES to mLCx, PCI/DES to mLAD and PCTA of ostial diagonal on 12/28/16  . Diabetes mellitus    a. diet controlled   . Exogenous obesity   . Gout   . Hemorrhoids   . History of kidney stones   . Hyperlipidemia   . Hypertension   . 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 APPLICATION OF WOUND VAC Left 02/05/2017   Procedure: APPLICATION OF WOUND VAC;  Surgeon: Serafina Mitchell, MD;  Location: [place];  Service: Vascular;  Laterality: Left;  . APPLICATION OF WOUND VAC Left 02/22/2017   Procedure: APPLICATION OF WOUND VAC LEFT GROIN;  Surgeon: Serafina Mitchell, MD;  Location: [employer];  Service: Vascular;  Laterality: Left;  . APPLICATION OF WOUND VAC Left 04/04/2017   Procedure: APPLICATION OF WOUND VAC;  Surgeon: Serafina Mitchell, MD;  Location: [place];  Service: Vascular;  Laterality: Left;  .  BREAST LUMPECTOMY  2009   left  . BREAST LUMPECTOMY WITH NEEDLE LOCALIZATION Left 01/06/2015   Procedure: BREAST BIOPSY WITH NEEDLE LOCALIZATION AND SKIN BIOPSY;  Surgeon: Autumn Messing III, MD;  Location: [place];  Service: General;  Laterality: Left;  . CATARACT EXTRACTION    . CHOLECYSTECTOMY  2010  . CORONARY STENT INTERVENTION N/A 12/28/2016   Procedure: Coronary Stent Intervention;  Surgeon: Burnell Blanks, MD;  Location: [facility];  Service: Cardiovascular;  Laterality: N/A;  . EYE SURGERY     BILATERAL CATARACT EXTRACTIONS AND LENS IMPLANTS  . FEMORAL ARTERY EXPLORATION N/A 02/05/2017   Procedure: Evacuation of Retroperitoneal Hematoma and Primary Repair of Femoral Artery and  FEMORAL ARTERY EXPLORATION;  Surgeon: Serafina Mitchell, MD;  Location: [place];  Service: Vascular;  Laterality: N/A;  . HEMATOMA EVACUATION Left 02/08/2017   Procedure: EVACUATION HEMATOMA;  Surgeon: Rosetta Posner, MD;  Location: [place];  Service: Vascular;  Laterality: Left;  . I&D EXTREMITY Left 02/22/2017   Procedure: [place];  Surgeon: Serafina Mitchell, MD;  Location: [place];  Service: Vascular;  Laterality: Left;  . I&D EXTREMITY Left 04/04/2017   Procedure: [place];  Surgeon: Serafina Mitchell, MD;  Location: [place];  Service: Vascular;  Laterality: Left;  . KNEE SURGERY     right PARTIAL KNEE REPLACEMENT  . LEFT HEART CATH AND CORONARY ANGIOGRAPHY N/A 12/17/2016   Procedure: Left Heart Cath and Coronary Angiography;  Surgeon: Burnell Blanks, MD;  Location: [place] [facility];  Service: Cardiovascular;  Laterality: N/A;  . MULTIPLE EXTRACTIONS WITH ALVEOLOPLASTY N/A 12/21/2016   Procedure: Extraction of tooth #'s 12, 23,24,25,26 and 29 with alveoloplasty and gross debridement of remaining teeth.;  Surgeon: Lenn Cal, DDS;  Location: [place];  Service: Oral Surgery;  Laterality: N/A;  . SHOULDER SURGERY     right  . SKIN GRAFT TO RIGHT HAND     AFTER BURN TO THE HAND  . TEE WITHOUT CARDIOVERSION N/A 02/05/2017   Procedure: TRANSESOPHAGEAL ECHOCARDIOGRAM (TEE);  Surgeon: Burnell Blanks, MD;  Location: [place];  Service: Open Heart Surgery;  Laterality: N/A;  . TOTAL KNEE ARTHROPLASTY Left 08/03/2013   Procedure: TOTAL LEFT KNEE ARTHROPLASTY;  Surgeon: Gearlean Alf, MD;  Location: [facility];  Service: Orthopedics;  Laterality: Left;  . TRANSCATHETER AORTIC VALVE REPLACEMENT, TRANSFEMORAL N/A 02/05/2017   Procedure: TRANSCATHETER AORTIC VALVE REPLACEMENT, TRANSFEMORAL;  Surgeon: Burnell Blanks, MD;  Location: [place];  Service: Open Heart Surgery;  Laterality: N/A;  . US ECHOCARDIOGRAPHY  05/15/2010   EF 60-65%     No Known Allergies  Current Outpatient Medications  Medication Sig Dispense Refill  . allopurinol (ZYLOPRIM) 300 MG tablet Take 300 mg by mouth daily.     Marland Kitchen amoxicillin-clavulanate (AUGMENTIN) 875-125 MG tablet   0  . aspirin 81 MG chewable tablet Chew 1 tablet (81 mg total) by mouth daily.    . Cholecalciferol (VITAMIN D PO) Take 1 capsule by mouth daily.    . clopidogrel (PLAVIX) 75 MG tablet Take 1 tablet (75 mg total) by mouth daily with breakfast. 30 tablet 6  . ferrous sulfate (FERROUSUL) 325 (65 FE) MG tablet Take 1 tablet (325 mg total) 3 (three) times daily with meals by mouth. (Patient taking differently: Take 325 mg by mouth daily with breakfast. ) 90 tablet 0  . hydrALAZINE (APRESOLINE) 50 MG tablet Take 50 mg 2 (two) times  daily by mouth.     . metoprolol tartrate (LOPRESSOR) 25 MG tablet Take 0.5 tablets (12.5 mg total) by mouth 2 (two) times daily. 90 tablet 3  . rosuvastatin (CRESTOR) 10 MG tablet Take 10 mg by mouth daily.   0  . COLCRYS 0.6 MG tablet   0  . COLLAGEN PO Take by mouth daily. power     . furosemide (LASIX) 40 MG tablet Take 1 tablet (40 mg total) by mouth daily as needed for fluid or edema. (Patient not taking: Reported on 07/08/2017) 90 tablet 1  . predniSONE (DELTASONE) 10 MG tablet   0  . tamoxifen (NOLVADEX) 20 MG tablet Take 1 tablet by mouth every morning. (Patient not taking: Reported on 07/08/2017) 90 tablet 3   No current facility-administered medications for this visit.     Family History  Problem Relation Age of Onset  . Cancer Mother        pancreatic  . Heart disease Father   . Hypertension Father   . Cancer Brother   . Diabetes Sister     Social History   Socioeconomic History  . Marital status: Married    Spouse name: Not on file  . Number of children: 4  . Years of education: Not on file  . Highest education level: Not on file  Social Needs  . Financial resource strain: Not on file  . Food insecurity - worry: Not on file   . Food insecurity - inability: Not on file  . Transportation needs - medical: Not on file  . Transportation needs - non-medical: Not on file  Occupational History  . 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 Smoking status: Never Smoker  . Smokeless tobacco: Never Used  Substance and Sexual Activity  . Alcohol use: No  . Drug use: No  . Sexual activity: Not Currently  Other Topics Concern  . Not on file  Social History Narrative   [place] Sleepiness scale score =11 as of 01/25/16     REVIEW OF SYSTEMS:   [X]  denotes positive finding, [ ]  denotes negative finding Cardiac  Comments:  Chest pain or chest pressure:    Shortness of breath upon exertion:    Short of breath when lying flat:    Irregular heart rhythm:        Vascular    Pain in calf, thigh, or hip brought on by ambulation:    Pain in feet at night that wakes you up from your sleep:     Blood clot in your veins:    Leg swelling:         Pulmonary    Oxygen at home:    Productive cough:     Wheezing:         Neurologic    Sudden weakness in arms or legs:     Sudden numbness in arms or legs:     Sudden onset of difficulty speaking or slurred speech:    Temporary loss of vision in one eye:     Problems with dizziness:         Gastrointestinal    Blood in stool:     Vomited blood:         Genitourinary    Burning when urinating:     Blood in urine:        Psychiatric    Major depression:         Hematologic    Bleeding problems:  Problems with blood clotting too easily:        Skin    Rashes or ulcers:        Constitutional    Fever or chills:      PHYSICAL EXAMINATION:  Vitals:   07/08/17 1350 07/08/17 1357  BP: (!) 145/61 134/61  Pulse: 72 68  Resp: 16   Temp: (!) 97.1 F (36.2 C)   SpO2: 95%    Vitals:   07/08/17 1350  Weight: 223 lb (101.2 kg)  Height: 5\' 3"  (1.6 m)   Body mass index is 39.5 kg/m.  General:  WDWN in NAD; vital signs documented  above Gait: Not observed HENT: WNL, normocephalic Pulmonary: normal non-labored breathing , without Rales, rhonchi,  wheezing Abdomen: soft, non tender Skin: see picture below:  Left groin Vascular Exam/Pulses:    Musculoskeletal: no muscle wasting or atrophy  Neurologic: A&O X 3;  No focal weakness or paresthesias are detected Psychiatric:  The pt has Normal affect.   Non-Invasive Vascular Imaging:   None today  Pt meds includes: Statin:  Yes.   Beta Blocker:  Yes.  Aspirin:  Yes.   ACEI:  No. ARB:  No. CCB use:  No Other Antiplatelet/Anticoagulant:  Yes Plavix   ASSESSMENT/PLAN:: 82 y.o. female with non healing wound left groin s/p evacuation of left retroperitoneal hematoma and primary repair of left femoral artery ruptured pseudoaneurysm and wound vac placement on 02/05/17 and return to OR on April 04, 2017 for I&D left groin and placement of wound vac returning today to evaluate wound.   -her left groin is slowly healing.  Dr. Trula Slade had a long discussion with pt and her family member about the advantage of having a wound vac vs continuing current dressing changes.  He told the pt that given the wound is healing, she can continue with the current dressing changes.  She or the [facility] will call our office in a couple of weeks with an update on the wound healing.  If there is little or no progress, will proceed with wound vac at that time.  Otherwise, we will see her back in 6 weeks for wound check.  She will also have a repeat carotid duplex in 6 months  to re-evaluate the right ICA that is 80-99% and left ICA that is 40-59%.  She has been asymptomatic.  The pt does not want to proceed with any intervention at this time for her carotid artery stenosis.      Leontine Locket, PA-C [facility] [phone]  Clinic MD:  Pt seen and examined with Dr. Trula Slade  I agree with the above.  I have seen and evaluated the patient.  We discussed continuing with her  current wound care which is with a silver dressing, 3 times a week.  She will contact me in 3 weeks.  If this has not healed we will consider switching her over to a wound VAC.  My biggest concern is the amount of moisture she gets under her pannus which is potentially impeding her ability to heal this wound.  I have scheduled for follow-up in 6 weeks for wound check  [place] Jillann Charette

## 2017-07-08 NOTE — Progress Notes (Signed)
Vitals:   07/08/17 1350  BP: (!) 145/61  Pulse: 72  Resp: 16  Temp: (!) 97.1 F (36.2 C)  TempSrc: Oral  SpO2: 95%  Weight: 223 lb (101.2 kg)  Height: 5\' 3"  (1.6 m)

## 2017-07-09 NOTE — Addendum Note (Signed)
Addended by: Lianne Cure A on: 07/09/2017 10:52 AM   Modules accepted: Orders

## 2017-07-15 ENCOUNTER — Ambulatory Visit: Payer: Self-pay | Admitting: Surgery

## 2017-07-22 ENCOUNTER — Other Ambulatory Visit: Payer: Self-pay | Admitting: Surgery

## 2017-07-22 MED ORDER — FLUCONAZOLE 200 MG PO TABS: 200.0000 mg | ORAL_TABLET | Freq: Every day | ORAL | 1 refills | Status: DC

## 2017-08-19 ENCOUNTER — Encounter (HOSPITAL_COMMUNITY): Payer: Self-pay

## 2017-08-19 ENCOUNTER — Other Ambulatory Visit: Payer: Self-pay

## 2017-08-19 ENCOUNTER — Other Ambulatory Visit: Payer: Self-pay | Admitting: Physician Assistant

## 2017-08-19 ENCOUNTER — Encounter: Payer: Self-pay | Admitting: Surgery

## 2017-08-19 ENCOUNTER — Ambulatory Visit (INDEPENDENT_AMBULATORY_CARE_PROVIDER_SITE_OTHER): Payer: Medicare Other | Admitting: Surgery

## 2017-08-19 VITALS — BP 171/68 | HR 80 | Temp 97.6°F | Resp 20 | Ht 63.0 in | Wt 231.0 lb

## 2017-08-19 DIAGNOSIS — K661 Hemoperitoneum: Secondary | ICD-10-CM

## 2017-08-19 DIAGNOSIS — I6523 Occlusion and stenosis of bilateral carotid arteries: Secondary | ICD-10-CM | POA: Diagnosis not present

## 2017-08-19 MED ORDER — FLUCONAZOLE 100 MG PO TABS: 100.0000 mg | ORAL_TABLET | Freq: Every day | ORAL | 0 refills | Status: AC

## 2017-08-19 MED ORDER — NYSTATIN 100000 UNIT/GM EX POWD: Freq: Four times a day (QID) | CUTANEOUS | 0 refills | Status: DC

## 2017-08-19 NOTE — Progress Notes (Signed)
HISTORY AND PHYSICAL     CC:  Follow up Requesting Provider:  Marton Redwood, MD  HPI: This is a 82 y.o. female who underwent TAVR on 02/05/17.  There was difficulty with sheath removal and closure device in the left groin, necessitating manual pressure. The patient was brought to the intensive care unit where she became hypotensive.  She responded to IVF resuscitation.   She did have a left groin hematoma.  She was taken to the operating room that day and underwent evacuation of left retroperitoneal hematoma and primary repair of left femoral artery ruptured pseudoaneurysm and wound vac placement.  She has taken a long time to heal this groin.  She has been to the ER after having 2 episodes of significant drainage from the wound.  She was seen by Dr. Trula Slade on 06/26/17 at which time he did not see any undrained fluid collections both on physical exam and by CT scan. I suspect she has had bleeding from the raw surface areas as well as some lymphatic drainage however there is no cavity that I can identify.  She states that her wound has healed, however, her groin is staying wet.  She was given an Rx for Nystatin and it was filled as cream.   States her legs are swollen due to eating chinese food last night.  She denies any unilateral weakness, numbness or clumsiness.  She also denies any speech difficulties or temporary blindness.  She does have some numbness in the tips of her fingers on the right hand, but this would not be affected by her right carotid artery stenosis.   Past Medical History:  Diagnosis Date  . Aortic stenosis, severe    a. 01/2017: s/p TAVR; hospital course complicated by large groin hematoma/wound  . Arthritis   . Breast cancer ([place])    a. L breast cancer s/p lumpectomy and XRT  . Carotid artery stenosis    a. 11/2016: 80-99% RICA stenosis, 1-39% vs low range 78-24% LICA   . Chronic diastolic CHF (congestive heart failure) ([place])   . CKD (chronic kidney disease)   .  Coronary artery disease    a. 11/2016: diagnosed with multivessel CAD, turned down for CABG and underwent PCI/DES to mLCx, PCI/DES to mLAD and PCTA of ostial diagonal on 12/28/16  . Diabetes mellitus    a. diet controlled   . Exogenous obesity   . Gout   . Hemorrhoids   . History of kidney stones   . Hyperlipidemia   . Hypertension   . 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 APPLICATION OF WOUND VAC Left 02/05/2017   Procedure: APPLICATION OF WOUND VAC;  Surgeon: Serafina Mitchell, MD;  Location: [facility];  Service: Vascular;  Laterality: Left;  . APPLICATION OF WOUND VAC Left 02/22/2017   Procedure: APPLICATION OF WOUND VAC LEFT GROIN;  Surgeon: Serafina Mitchell, MD;  Location: [facility];  Service: Vascular;  Laterality: Left;  . APPLICATION OF WOUND VAC Left 04/04/2017   Procedure: APPLICATION OF WOUND VAC;  Surgeon: Serafina Mitchell, MD;  Location: [facility];  Service: Vascular;  Laterality: Left;  . BREAST LUMPECTOMY  2009   left  . BREAST LUMPECTOMY WITH NEEDLE LOCALIZATION Left 01/06/2015   Procedure: BREAST BIOPSY WITH NEEDLE LOCALIZATION AND SKIN BIOPSY;  Surgeon: Autumn Messing III, MD;  Location: [facility];  Service: General;  Laterality: Left;  . CATARACT EXTRACTION    . CHOLECYSTECTOMY  2010  . CORONARY STENT INTERVENTION N/A 12/28/2016   Procedure: Coronary Stent Intervention;  Surgeon: Burnell Blanks, MD;  Location: [facility];  Service: Cardiovascular;  Laterality: N/A;  . EYE SURGERY     BILATERAL CATARACT EXTRACTIONS AND LENS IMPLANTS  . FEMORAL ARTERY EXPLORATION N/A 02/05/2017   Procedure: Evacuation of Retroperitoneal Hematoma and Primary Repair of Femoral Artery and FEMORAL ARTERY EXPLORATION;  Surgeon: Serafina Mitchell, MD;  Location: [facility];  Service: Vascular;   Laterality: N/A;  . HEMATOMA EVACUATION Left 02/08/2017   Procedure: EVACUATION HEMATOMA;  Surgeon: Rosetta Posner, MD;  Location: [facility];  Service: Vascular;  Laterality: Left;  . I&D EXTREMITY Left 02/22/2017   Procedure: [place];  Surgeon: Serafina Mitchell, MD;  Location: [facility];  Service: Vascular;  Laterality: Left;  . I&D EXTREMITY Left 04/04/2017   Procedure: [place];  Surgeon: Serafina Mitchell, MD;  Location: [facility];  Service: Vascular;  Laterality: Left;  . KNEE SURGERY     right PARTIAL KNEE REPLACEMENT  . LEFT HEART CATH AND CORONARY ANGIOGRAPHY N/A 12/17/2016   Procedure: Left Heart Cath and Coronary Angiography;  Surgeon: Burnell Blanks, MD;  Location: [facility];  Service: Cardiovascular;  Laterality: N/A;  . MULTIPLE EXTRACTIONS WITH ALVEOLOPLASTY N/A 12/21/2016   Procedure: Extraction of tooth #'s 12, 23,24,25,26 and 29 with alveoloplasty and gross debridement of remaining teeth.;  Surgeon: Lenn Cal, DDS;  Location: [facility];  Service: Oral Surgery;  Laterality: N/A;  . SHOULDER SURGERY     right  . SKIN GRAFT TO RIGHT HAND     AFTER BURN TO THE HAND  . TEE WITHOUT CARDIOVERSION N/A 02/05/2017   Procedure: TRANSESOPHAGEAL ECHOCARDIOGRAM (TEE);  Surgeon: Burnell Blanks, MD;  Location: [facility];  Service: Open Heart Surgery;  Laterality: N/A;  . TOTAL KNEE ARTHROPLASTY Left 08/03/2013   Procedure: TOTAL LEFT KNEE ARTHROPLASTY;  Surgeon: Gearlean Alf, MD;  Location: [facility];  Service: Orthopedics;  Laterality: Left;  . TRANSCATHETER AORTIC VALVE REPLACEMENT, TRANSFEMORAL N/A 02/05/2017   Procedure: TRANSCATHETER AORTIC VALVE REPLACEMENT, TRANSFEMORAL;  Surgeon: Burnell Blanks, MD;  Location: [facility];  Service: Open Heart Surgery;  Laterality: N/A;  . US ECHOCARDIOGRAPHY  05/15/2010   EF 60-65%    No Known Allergies  Current Outpatient Medications  Medication Sig Dispense Refill  .  allopurinol (ZYLOPRIM) 300 MG tablet Take 300 mg by mouth daily.     Marland Kitchen amoxicillin-clavulanate (AUGMENTIN) 875-125 MG tablet   0  . aspirin 81 MG chewable tablet Chew 1 tablet (81 mg total) by mouth daily.    . Cholecalciferol (VITAMIN D PO) Take 1 capsule by mouth daily.    . clopidogrel (PLAVIX) 75 MG tablet Take 1 tablet (75 mg total) by mouth daily with breakfast. 30 tablet 6  . COLCRYS 0.6 MG tablet   0  . COLLAGEN PO Take by mouth daily. power     . ferrous sulfate (FERROUSUL) 325 (65 FE) MG tablet Take 1 tablet (325 mg total) 3 (three) times daily with meals by mouth. (Patient taking differently: Take 325 mg by mouth daily with breakfast. ) 90 tablet 0  . fluconazole (DIFLUCAN) 200 MG tablet Take 1 tablet (200 mg total) by mouth daily. 1 tablet 1  . furosemide (LASIX) 40 MG tablet  Take 1 tablet (40 mg total) by mouth daily as needed for fluid or edema. 90 tablet 1  . hydrALAZINE (APRESOLINE) 50 MG tablet Take 50 mg 2 (two) times daily by mouth.     . metoprolol tartrate (LOPRESSOR) 25 MG tablet Take 0.5 tablets (12.5 mg total) by mouth 2 (two) times daily. 90 tablet 3  . predniSONE (DELTASONE) 10 MG tablet   0  . rosuvastatin (CRESTOR) 10 MG tablet Take 10 mg by mouth daily.   0  . tamoxifen (NOLVADEX) 20 MG tablet Take 1 tablet by mouth every morning. 90 tablet 3   No current facility-administered medications for this visit.     Family History  Problem Relation Age of Onset  . Cancer Mother        pancreatic  . Heart disease Father   . Hypertension Father   . Cancer Brother   . Diabetes Sister     Social History   Socioeconomic History  . Marital status: Married    Spouse name: Not on file  . Number of children: 4  . Years of education: Not on file  . Highest education level: Not on file  Occupational History  . 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 Financial resource strain: Not on file  . Food insecurity:    Worry: Not on file    Inability:  Not on file  . Transportation needs:    Medical: Not on file    Non-medical: Not on file  Tobacco Use  . Smoking status: Never Smoker  . Smokeless tobacco: Never Used  Substance and Sexual Activity  . Alcohol use: No  . Drug use: No  . Sexual activity: Not Currently  Lifestyle  . Physical activity:    Days per week: Not on file    Minutes per session: Not on file  . Stress: Not on file  Relationships  . Social connections:    Talks on phone: Not on file    Gets together: Not on file    Attends religious service: Not on file    Active member of club or organization: Not on file    Attends meetings of clubs or organizations: Not on file    Relationship status: Not on file  . Intimate partner violence:    Fear of current or ex partner: Not on file    Emotionally abused: Not on file    Physically abused: Not on file    Forced sexual activity: Not on file  Other Topics Concern  . Not on file  Social History Narrative   Epworth Sleepiness scale score =11 as of 01/25/16     REVIEW OF SYSTEMS:   [X]  denotes positive finding, [ ]  denotes negative finding Cardiac  Comments:  Chest pain or chest pressure:    Shortness of breath upon exertion:    Short of breath when lying flat:    Irregular heart rhythm:        Vascular    Pain in calf, thigh, or hip brought on by ambulation:    Pain in feet at night that wakes you up from your sleep:     Blood clot in your veins:    Leg swelling:         Pulmonary    Oxygen at home:    Productive cough:     Wheezing:         Neurologic    Sudden weakness in arms or legs:  Sudden numbness in arms or legs:     Sudden onset of difficulty speaking or slurred speech:    Temporary loss of vision in one eye:     Problems with dizziness:         Gastrointestinal    Blood in stool:     Vomited blood:         Genitourinary    Burning when urinating:     Blood in urine:        Psychiatric    Major depression:         Hematologic     Bleeding problems:    Problems with blood clotting too easily:        Skin    Rashes or ulcers:        Constitutional    Fever or chills:      PHYSICAL EXAMINATION:  Vitals:   08/19/17 1607  BP: (!) 171/68  Pulse: 80  Resp: 20  Temp: 97.6 F (36.4 C)  SpO2: 95%   Vitals:   08/19/17 1607  Weight: 231 lb (104.8 kg)  Height: 5\' 3"  (1.6 m)   Body mass index is 40.92 kg/m.  General:  WDWN in NAD; vital signs documented above Gait: Not observed HENT: WNL, normocephalic Pulmonary: normal non-labored breathing Abdomen: soft, NT Skin: left groin with increased erythema from last visit and moist. Extremities: BLE swelling    Musculoskeletal: no muscle wasting or atrophy  Neurologic: A&O X 3;  No focal weakness or paresthesias are detected Psychiatric:  The pt has Normal affect.   Non-Invasive Vascular Imaging:   none  Pt meds includes: Statin:  Yes.   Beta Blocker:  Yes. Aspirin:  Yes.   ACEI:  No. ARB:  No. CCB use:  No Other Antiplatelet/Anticoagulant:  Yes Plavix   ASSESSMENT/PLAN:: 82 y.o. female who had a non healing wound left groin s/p evacuation of left retroperitoneal hematoma and primary repair of left femoral artery ruptured pseudoaneurysm and wound vac placement on 02/05/17 and return to OR on April 04, 2017 for I&D left groin and placement of wound vac returning today to evaluate wound.   -pt's left groin with severe yeast infection.  Discussed that she needs to keep this groin dry at all times except when in the shower (shower daily with soap and water).  Discussed in detail for pt to keep groin dry with gauze or maxi pad and place a 2-3 times a day.  She understands the only time her groin should be wet is if she is in the shower.  -nystatin powder prescribed as well as diflucan x 5 days.   -follow up in 4 weeks to re-check groin. -pt continues to be asymptomatic from her carotid stenosis.  She continues to decline carotid endarterectomy.  Pt  understands the stroke symptoms.  She does have some numbness in the tips of her fingers on the right hand, but this would not be affected by her right carotid artery stenosis.    Carrie Locket, PA-C [facility] [phone]  Clinic MD:  Pt seen and examined with Dr. Trula Slade It appears the patient has a yeast infection secondary to the moisture in the left groin.  We are giving her nystatin powder and starting her on Diflucan.  I will have her follow-up again in a month.  I again discussed proceeding with carotid endarterectomy.  She is asymptomatic and still refusing to have the operation.  [name]

## 2017-08-26 ENCOUNTER — Telehealth: Payer: Self-pay | Admitting: *Deleted

## 2017-08-26 NOTE — Telephone Encounter (Signed)
Report of condition from Home health nurse.

## 2017-09-23 ENCOUNTER — Encounter: Payer: Self-pay | Admitting: Surgery

## 2017-09-23 ENCOUNTER — Ambulatory Visit: Payer: Medicare Other | Admitting: Surgery

## 2017-09-23 VITALS — BP 187/82 | HR 55 | Temp 97.0°F | Resp 18 | Ht 63.0 in | Wt 225.6 lb

## 2017-09-23 DIAGNOSIS — K661 Hemoperitoneum: Secondary | ICD-10-CM | POA: Diagnosis not present

## 2017-09-23 DIAGNOSIS — I6523 Occlusion and stenosis of bilateral carotid arteries: Secondary | ICD-10-CM

## 2017-09-23 NOTE — Progress Notes (Signed)
[facility]  Patient name: Carrie Monroe MRN: [id number] DOB: 10/17/1935 Sex: female   REASON FOR VISIT:    Follow up  [place]:    Carrie Monroe is a 82 y.o. female who returns for follow-up.  She had a large hematoma/pseudoaneurysm following TAVR which required emergent repair.  She has had significant issues with wound healing.  Her wound is now completely healed.  I am also following her for carotid stenosis which is greater than 80% on the right however she has been refusing intervention.  She remains asymptomatic.   PAST MEDICAL HISTORY:   Past Medical History:  Diagnosis Date  . Aortic stenosis, severe    a. 01/2017: s/p TAVR; hospital course complicated by large groin hematoma/wound  . Arthritis   . Breast cancer ([place])    a. L breast cancer s/p lumpectomy and XRT  . Carotid artery stenosis    a. 11/2016: 80-99% RICA stenosis, 1-39% vs low range 29-52% LICA   . Chronic diastolic CHF (congestive heart failure) ([place])   . CKD (chronic kidney disease)   . Coronary artery disease    a. 11/2016: diagnosed with multivessel CAD, turned down for CABG and underwent PCI/DES to mLCx, PCI/DES to mLAD and PCTA of ostial diagonal on 12/28/16  . Diabetes mellitus    a. diet controlled   . Exogenous obesity   . Gout   . Hemorrhoids   . History of kidney stones   . Hyperlipidemia   . Hypertension   . 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 Cancer Mother        pancreatic  . Heart disease Father   . Hypertension Father   . Cancer Brother   . Diabetes Sister     SOCIAL HISTORY:   Social History   Tobacco Use  . Smoking status: Never Smoker  . Smokeless tobacco: Never Used    Substance Use Topics  . Alcohol use: No     ALLERGIES:   No Known Allergies   CURRENT MEDICATIONS:   Current Outpatient Medications  Medication Sig Dispense Refill  . allopurinol (ZYLOPRIM) 300 MG tablet Take 300 mg by mouth daily.     Marland Kitchen amoxicillin-clavulanate (AUGMENTIN) 875-125 MG tablet   0  . aspirin 81 MG chewable tablet Chew 1 tablet (81 mg total) by mouth daily.    . Cholecalciferol (VITAMIN D PO) Take 1 capsule by mouth daily.    . clopidogrel (PLAVIX) 75 MG tablet Take 1 tablet (75 mg total) by mouth daily with breakfast. 30 tablet 6  . COLCRYS 0.6 MG tablet   0  . COLLAGEN PO Take by mouth daily. power     . ferrous sulfate (FERROUSUL) 325 (65 FE) MG tablet Take 1 tablet (325 mg total) 3 (three) times daily with meals by mouth. (Patient taking differently: Take 325 mg by mouth daily with breakfast. ) 90 tablet 0  . furosemide (LASIX) 40 MG tablet Take 1 tablet (40 mg total) by mouth daily as needed for fluid or edema. 90 tablet 1  . hydrALAZINE (APRESOLINE) 50 MG tablet Take 50 mg 2 (two) times daily by mouth.     . metoprolol tartrate (LOPRESSOR) 25 MG tablet Take 0.5 tablets (12.5 mg total) by mouth  2 (two) times daily. 90 tablet 3  . nystatin (MYCOSTATIN/NYSTOP) powder Apply topically 4 (four) times daily. 15 g 0  . predniSONE (DELTASONE) 10 MG tablet   0  . rosuvastatin (CRESTOR) 10 MG tablet Take 10 mg by mouth daily.   0  . tamoxifen (NOLVADEX) 20 MG tablet Take 1 tablet by mouth every morning. 90 tablet 3   No current facility-administered medications for this visit.     REVIEW OF SYSTEMS:   [X]  denotes positive finding, [ ]  denotes negative finding Cardiac  Comments:  Chest pain or chest pressure:    Shortness of breath upon exertion:    Short of breath when lying flat:    Irregular heart rhythm:        Vascular    Pain in calf, thigh, or hip brought on by ambulation:    Pain in feet at night that wakes you up from your sleep:     Blood clot in your  veins:    Leg swelling:         Pulmonary    Oxygen at home:    Productive cough:     Wheezing:         Neurologic    Sudden weakness in arms or legs:     Sudden numbness in arms or legs:     Sudden onset of difficulty speaking or slurred speech:    Temporary loss of vision in one eye:     Problems with dizziness:         Gastrointestinal    Blood in stool:     Vomited blood:         Genitourinary    Burning when urinating:     Blood in urine:        Psychiatric    Major depression:         Hematologic    Bleeding problems:    Problems with blood clotting too easily:        Skin    Rashes or ulcers:        Constitutional    Fever or chills:      PHYSICAL EXAM:   Vitals:   09/23/17 1620 09/23/17 1624  BP: (!) 184/78 (!) 187/82  Pulse: (!) 55   Resp: 18   Temp: (!) 97 F (36.1 C)   TempSrc: Oral   SpO2: 95%   Weight: 225 lb 9.6 oz (102.3 kg)   Height: 5\' 3"  (1.6 m)     GENERAL: The patient is a well-nourished female, in no acute distress. The vital signs are documented above. CARDIAC: There is a regular rate and rhythm.  PULMONARY: Non-labored respirations ABDOMEN: Soft and non-tender with normal pitched bowel sounds.  MUSCULOSKELETAL: There are no major deformities or cyanosis. NEUROLOGIC: No focal weakness or paresthesias are detected. SKIN: Right groin wound has completely healed  PSYCHIATRIC: The patient has a normal affect.  STUDIES:   None  MEDICAL ISSUES:   Right groin wound following rectoperineal hematoma: Completely healed Carotid stenosis: The patient is going to follow-up in August with a CT angiogram to determine if she is a candidate for TCAR, and to get a better idea of the bifurcation location and actual degree of stenosis.    [name], MD [facility] [phone] Pager [phone]

## 2017-09-27 ENCOUNTER — Ambulatory Visit: Payer: Medicare Other | Admitting: Cardiovascular Disease

## 2017-09-27 ENCOUNTER — Encounter: Payer: Self-pay | Admitting: Cardiovascular Disease

## 2017-09-27 VITALS — BP 148/76 | HR 81 | Ht 63.0 in | Wt 229.8 lb

## 2017-09-27 DIAGNOSIS — Z952 Presence of prosthetic heart valve: Secondary | ICD-10-CM | POA: Diagnosis not present

## 2017-09-27 DIAGNOSIS — D62 Acute posthemorrhagic anemia: Secondary | ICD-10-CM

## 2017-09-27 DIAGNOSIS — I5032 Chronic diastolic (congestive) heart failure: Secondary | ICD-10-CM | POA: Diagnosis not present

## 2017-09-27 DIAGNOSIS — I6521 Occlusion and stenosis of right carotid artery: Secondary | ICD-10-CM | POA: Diagnosis not present

## 2017-09-27 DIAGNOSIS — I2510 Atherosclerotic heart disease of native coronary artery without angina pectoris: Secondary | ICD-10-CM | POA: Diagnosis not present

## 2017-09-27 DIAGNOSIS — I10 Essential (primary) hypertension: Secondary | ICD-10-CM

## 2017-09-27 DIAGNOSIS — D649 Anemia, unspecified: Secondary | ICD-10-CM | POA: Insufficient documentation

## 2017-09-27 NOTE — Progress Notes (Signed)
Cardiology Office Note    Date:  09/27/2017   ID:  Carrie Monroe, DOB 01-20-1936, MRN [id number]  PCP:  Marton Redwood, MD  Cardiologist:   Sanda Klein, MD   Chief Complaint  Patient presents with  . Follow-up    CAD, TAVR, CHF    History of Present Illness:  Carrie Monroe is a 82 y.o. female with CAD s/p PCI (stents to the LAD artery and RCA in early August 2018), aortic stenosis s/p TAVR (September 2018), chronic diastolic heart failure, 26% right internal carotid artery stenosis, hypertension, hyperlipidemia, obesity, diabetes mellitus and remote history of left breast surgery and radiation therapy for cancer roughly 5 years ago.  She had a slow recovery after her aortic valve replacement procedure due to severe hemorrhage, circulatory collapse, infection of the groin access site, but she is now finally over it.  Her breathing is better now than it was a year ago.  She has not had any serious bleeding problems.  She denies syncope or palpitations or angina.  Her stamina is not great, but she has NYHA functional class II status.  She takes care of her own housework, works in the garden as well.  Her husband has remained in the nursing home due to progressive dementia.  Her weight has remained steady in the 225-230 pound range.  She adjusts her furosemide based on her weight of swelling.  She has taken it 3 times this week, but on the average takes it at most once or twice a week.  She does not have orthopnea or PND and has minimal pedal edema.  Her most recent echocardiogram last October shows an excellent result post TAVR with a mean gradient across the aortic prosthesis of only 3 mmHg and normal left ventricular systolic function.  She is still on aspirin and Plavix.  Her most recent hemoglobin was 9.4 with normocytic indices in January 2019.  She had a lesion removed from her scalp that is suspected to be cancerous, but she is waiting for the biopsy results.  Past  Medical History:  Diagnosis Date  . Aortic stenosis, severe    a. 01/2017: s/p TAVR; hospital course complicated by large groin hematoma/wound  . Arthritis   . Breast cancer ([place])    a. L breast cancer s/p lumpectomy and XRT  . Carotid artery stenosis    a. 11/2016: 80-99% RICA stenosis, 1-39% vs low range 41-58% LICA   . Chronic diastolic CHF (congestive heart failure) ([place])   . CKD (chronic kidney disease)   . Coronary artery disease    a. 11/2016: diagnosed with multivessel CAD, turned down for CABG and underwent PCI/DES to mLCx, PCI/DES to mLAD and PCTA of ostial diagonal on 12/28/16  . Diabetes mellitus    a. diet controlled   . Exogenous obesity   . Gout   . Hemorrhoids   . History of kidney stones   . Hyperlipidemia   . Hypertension   . 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 APPLICATION OF WOUND VAC Left 02/05/2017   Procedure: APPLICATION OF WOUND VAC;  Surgeon: Serafina Mitchell, MD;  Location: [facility];  Service: Vascular;  Laterality: Left;  . APPLICATION OF WOUND VAC Left 02/22/2017   Procedure: APPLICATION OF WOUND VAC LEFT GROIN;  Surgeon: Serafina Mitchell, MD;  Location: [facility];  Service: Vascular;  Laterality: Left;  . APPLICATION OF WOUND VAC Left 04/04/2017   Procedure: APPLICATION OF WOUND VAC;  Surgeon: Serafina Mitchell, MD;  Location: [facility];  Service: Vascular;  Laterality: Left;  . BREAST LUMPECTOMY  2009   left  . BREAST LUMPECTOMY WITH NEEDLE LOCALIZATION Left 01/06/2015   Procedure: BREAST BIOPSY WITH NEEDLE LOCALIZATION AND SKIN BIOPSY;  Surgeon: Autumn Messing III, MD;  Location: [facility];  Service: General;  Laterality: Left;  . CATARACT EXTRACTION    . CHOLECYSTECTOMY  2010  . CORONARY STENT INTERVENTION N/A 12/28/2016   Procedure: Coronary Stent Intervention;   Surgeon: Burnell Blanks, MD;  Location: [facility] [facility];  Service: Cardiovascular;  Laterality: N/A;  . EYE SURGERY     BILATERAL CATARACT EXTRACTIONS AND LENS IMPLANTS  . FEMORAL ARTERY EXPLORATION N/A 02/05/2017   Procedure: Evacuation of Retroperitoneal Hematoma and Primary Repair of Femoral Artery and FEMORAL ARTERY EXPLORATION;  Surgeon: Serafina Mitchell, MD;  Location: [facility];  Service: Vascular;  Laterality: N/A;  . HEMATOMA EVACUATION Left 02/08/2017   Procedure: EVACUATION HEMATOMA;  Surgeon: Rosetta Posner, MD;  Location: [facility];  Service: Vascular;  Laterality: Left;  . I&D EXTREMITY Left 02/22/2017   Procedure: [place];  Surgeon: Serafina Mitchell, MD;  Location: [facility];  Service: Vascular;  Laterality: Left;  . I&D EXTREMITY Left 04/04/2017   Procedure: [place];  Surgeon: Serafina Mitchell, MD;  Location: [facility];  Service: Vascular;  Laterality: Left;  . KNEE SURGERY     right PARTIAL KNEE REPLACEMENT  . LEFT HEART CATH AND CORONARY ANGIOGRAPHY N/A 12/17/2016   Procedure: Left Heart Cath and Coronary Angiography;  Surgeon: Burnell Blanks, MD;  Location: [facility];  Service: Cardiovascular;  Laterality: N/A;  . MULTIPLE EXTRACTIONS WITH ALVEOLOPLASTY N/A 12/21/2016   Procedure: Extraction of tooth #'s 12, 23,24,25,26 and 29 with alveoloplasty and gross debridement of remaining teeth.;  Surgeon: Lenn Cal, DDS;  Location: [facility];  Service: Oral Surgery;  Laterality: N/A;  . SHOULDER SURGERY     right  . SKIN GRAFT TO RIGHT HAND     AFTER BURN TO THE HAND  . TEE WITHOUT CARDIOVERSION N/A 02/05/2017   Procedure: TRANSESOPHAGEAL ECHOCARDIOGRAM (TEE);  Surgeon: Burnell Blanks, MD;  Location: [facility];  Service: Open Heart Surgery;  Laterality: N/A;  . TOTAL KNEE ARTHROPLASTY Left 08/03/2013   Procedure: TOTAL LEFT KNEE ARTHROPLASTY;  Surgeon: Gearlean Alf, MD;  Location: [facility];  Service:  Orthopedics;  Laterality: Left;  . TRANSCATHETER AORTIC VALVE REPLACEMENT, TRANSFEMORAL N/A 02/05/2017   Procedure: TRANSCATHETER AORTIC VALVE REPLACEMENT, TRANSFEMORAL;  Surgeon: Burnell Blanks, MD;  Location: [facility];  Service: Open Heart Surgery;  Laterality: N/A;  . US ECHOCARDIOGRAPHY  05/15/2010   EF 60-65%    Current Medications: Outpatient Medications Prior to Visit  Medication Sig Dispense Refill  . allopurinol (ZYLOPRIM) 300 MG tablet Take 300 mg by mouth daily.     Marland Kitchen aspirin 81 MG chewable tablet Chew 1 tablet (81 mg total) by mouth daily.    . Cholecalciferol (VITAMIN D PO) Take 1 capsule by mouth daily.    . clopidogrel (PLAVIX) 75 MG tablet Take 1 tablet (75 mg total) by mouth daily with breakfast. 30 tablet 6  . COLCRYS 0.6 MG tablet   0  . COLLAGEN PO Take by mouth daily. power     .  ferrous sulfate (FERROUSUL) 325 (65 FE) MG tablet Take 1 tablet (325 mg total) 3 (three) times daily with meals by mouth. (Patient taking differently: Take 325 mg by mouth daily with breakfast. ) 90 tablet 0  . furosemide (LASIX) 40 MG tablet Take 1 tablet (40 mg total) by mouth daily as needed for fluid or edema. 90 tablet 1  . hydrALAZINE (APRESOLINE) 50 MG tablet Take 50 mg 2 (two) times daily by mouth.     . metoprolol tartrate (LOPRESSOR) 25 MG tablet Take 0.5 tablets (12.5 mg total) by mouth 2 (two) times daily. 90 tablet 3  . rosuvastatin (CRESTOR) 10 MG tablet Take 10 mg by mouth daily.   0  . amoxicillin-clavulanate (AUGMENTIN) 875-125 MG tablet   0  . nystatin (MYCOSTATIN/NYSTOP) powder Apply topically 4 (four) times daily. (Patient not taking: Reported on 09/27/2017) 15 g 0  . predniSONE (DELTASONE) 10 MG tablet   0  . tamoxifen (NOLVADEX) 20 MG tablet Take 1 tablet by mouth every morning. (Patient not taking: Reported on 09/27/2017) 90 tablet 3   No facility-administered medications prior to visit.      Allergies:   Patient has no known allergies.   Social History    Socioeconomic History  . Marital status: Married    Spouse name: Not on file  . Number of children: 4  . Years of education: Not on file  . Highest education level: Not on file  Occupational History  . 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 Financial resource strain: Not on file  . Food insecurity:    Worry: Not on file    Inability: Not on file  . Transportation needs:    Medical: Not on file    Non-medical: Not on file  Tobacco Use  . Smoking status: Never Smoker  . Smokeless tobacco: Never Used  Substance and Sexual Activity  . Alcohol use: No  . Drug use: No  . Sexual activity: Not Currently  Lifestyle  . Physical activity:    Days per week: Not on file    Minutes per session: Not on file  . Stress: Not on file  Relationships  . Social connections:    Talks on phone: Not on file    Gets together: Not on file    Attends religious service: Not on file    Active member of club or organization: Not on file    Attends meetings of clubs or organizations: Not on file    Relationship status: Not on file  Other Topics Concern  . Not on file  Social History Narrative   Epworth Sleepiness scale score =11 as of 01/25/16     Family History:  [name] family history includes Cancer in her brother and mother; Diabetes in her sister; Heart disease in her father; Hypertension in her father.   ROS:   Please see the history of present illness.    ROS All other systems reviewed and are negative.   PHYSICAL EXAM:   VS:  BP (!) 148/76   Pulse 81   Ht 5\' 3"  (1.6 m)   Wt 229 lb 12.8 oz (104.2 kg)   BMI 40.71 kg/m      General: Alert, oriented x3, no distress, morbidly obese Head: no evidence of trauma, PERRL, EOMI, no exophtalmos or lid lag, no myxedema, no xanthelasma; normal ears, nose and oropharynx Neck: normal jugular venous pulsations and no hepatojugular reflux; brisk carotid pulses without delay ; there is a  faint right carotid  bruit Chest: clear to auscultation, no signs of consolidation by percussion or palpation, normal fremitus, symmetrical and full respiratory excursions Cardiovascular: normal position and quality of the apical impulse, regular rhythm, normal first and second heart sounds, 2/6 early peaking systolic ejection murmur focus, no diastolic murmurs, rubs or gallops Abdomen: no tenderness or distention, no masses by palpation, no abnormal pulsatility or arterial bruits, normal bowel sounds, no hepatosplenomegaly Extremities: no clubbing, cyanosis or edema; 2+ radial, ulnar and brachial pulses bilaterally; 2+ right femoral, posterior tibial and dorsalis pedis pulses; 2+ left femoral, posterior tibial and dorsalis pedis pulses; no subclavian or femoral bruits Neurological: grossly nonfocal Psych: Normal mood and affect    Wt Readings from Last 3 Encounters:  09/27/17 229 lb 12.8 oz (104.2 kg)  09/23/17 225 lb 9.6 oz (102.3 kg)  08/19/17 231 lb (104.8 kg)      Studies/Labs Reviewed:   EKG:  EKG is ordered today.  The ekg ordered today demonstrates Sinus rhythm, left bundle branch block (QRS 156 ms, QTC 469 ms.  Similar to her ECG from last October   ASSESSMENT:    1. Chronic diastolic heart failure ([place])   2. S/P TAVR (transcatheter aortic valve replacement)   3. Coronary artery disease involving native coronary artery of native heart without angina pectoris   4. Essential hypertension   5. Stenosis of right carotid artery   6. Morbid obesity due to excess calories ([place])   7. Anemia due to acute blood loss      PLAN:  In order of problems listed above:  1. CHF: Clinically euvolemic, NYHA functional class II (deconditioning and anemia are likely playing a role in this as well as obesity).  Only needs intermittent loop diuretic administration.  Preserved left ventricular systolic function. 2. AS s/p TAVR: 63mm Medtronic CoreValve Evolut Pro in September 2018.  Shee seems to be reaping some  benefit from the complicated procedure. Reminded her of the need for endocarditis prevention.  We will recheck an echocardiogram in September, 1 year after device implantation.  See her back in clinic after the echo is done. 3. CAD s/p PCI: Synergy DES LAD and RCA in August 2018.  Plan to discontinue the clopidogrel in August. 4. HTN: Remains well controlled after the reduction in beta-blocker dose. 5. Right carotid stenosis: Asymptomatic 80% stenosis.  On aspirin/Plavix and statin, no history of TIA/CVA.  Dr. Trula Slade advised surgery, but she declined.  She has a follow-up appointment with him in August. 6. Obesity: Contributing to dyspnea.  Weight loss would be very beneficial. 7. Anemia: She appears less pale but has not had a repeat hemoglobin since January   Medication Adjustments/Labs and Tests Ordered: Current medicines are reviewed at length with the patient today.  Concerns regarding medicines are outlined above.  Medication changes, Labs and Tests ordered today are listed in the Patient Instructions below. Patient Instructions  Dr Sallyanne Kuster recommends that you continue on your current medications as directed. Please refer to the Current Medication list given to you today.  Your physician has requested that you have an echocardiogram in September. Echocardiography is a painless test that uses sound waves to create images of your heart. It provides your doctor with information about the size and shape of your heart and how well your heart's chambers and valves are working. This procedure takes approximately one hour. There are no restrictions for this procedure. This will be performed at our [facility] location [place], [address] [place] [address] [phone]  Dr Sallyanne Kuster recommends that you schedule a follow-up appointment in 5 months. You will receive a reminder letter in the mail two months in advance. If you don't receive a letter, please call our office to schedule the  follow-up appointment.  If you need a refill on your cardiac medications before your next appointment, please call your pharmacy.     Signed, Sanda Klein, MD  09/27/2017 12:40 PM    [address], [place], [place]  [address] Phone: [phone]; Fax: [phone]

## 2017-09-27 NOTE — Patient Instructions (Signed)
Dr Sallyanne Kuster recommends that you continue on your current medications as directed. Please refer to the Current Medication list given to you today.  Your physician has requested that you have an echocardiogram in September. Echocardiography is a painless test that uses sound waves to create images of your heart. It provides your doctor with information about the size and shape of your heart and how well your heart's chambers and valves are working. This procedure takes approximately one hour. There are no restrictions for this procedure. This will be performed at our [facility] location [place], [place] [address] [phone]  Dr Sallyanne Kuster recommends that you schedule a follow-up appointment in 5 months. You will receive a reminder letter in the mail two months in advance. If you don't receive a letter, please call our office to schedule the follow-up appointment.  If you need a refill on your cardiac medications before your next appointment, please call your pharmacy.

## 2017-10-04 ENCOUNTER — Other Ambulatory Visit: Payer: Self-pay

## 2017-10-04 ENCOUNTER — Other Ambulatory Visit: Payer: Self-pay | Admitting: Surgery

## 2017-10-04 DIAGNOSIS — I6523 Occlusion and stenosis of bilateral carotid arteries: Secondary | ICD-10-CM

## 2017-10-07 ENCOUNTER — Other Ambulatory Visit (HOSPITAL_COMMUNITY): Payer: Self-pay

## 2017-10-26 DIAGNOSIS — IMO0002 Reserved for concepts with insufficient information to code with codable children: Secondary | ICD-10-CM

## 2017-10-26 HISTORY — DX: Reserved for concepts with insufficient information to code with codable children: IMO0002

## 2017-10-31 HISTORY — PX: SQUAMOUS CELL CARCINOMA EXCISION: SHX2433

## 2017-11-04 ENCOUNTER — Inpatient Hospital Stay (HOSPITAL_COMMUNITY)
Admission: EM | Admit: 2017-11-04 | Discharge: 2017-11-10 | DRG: 862 | Disposition: A | Discharge status: Home Health | Payer: Medicare Other | Attending: Internal Medicine | Admitting: Internal Medicine

## 2017-11-04 ENCOUNTER — Encounter (HOSPITAL_COMMUNITY): Payer: Self-pay

## 2017-11-04 ENCOUNTER — Emergency Department (HOSPITAL_COMMUNITY): Payer: Medicare Other

## 2017-11-04 DIAGNOSIS — Z955 Presence of coronary angioplasty implant and graft: Secondary | ICD-10-CM | POA: Diagnosis not present

## 2017-11-04 DIAGNOSIS — N189 Chronic kidney disease, unspecified: Secondary | ICD-10-CM

## 2017-11-04 DIAGNOSIS — I130 Hypertensive heart and chronic kidney disease with heart failure and stage 1 through stage 4 chronic kidney disease, or unspecified chronic kidney disease: Secondary | ICD-10-CM | POA: Diagnosis present

## 2017-11-04 DIAGNOSIS — T8149XA Infection following a procedure, other surgical site, initial encounter: Secondary | ICD-10-CM

## 2017-11-04 DIAGNOSIS — Y838 Other surgical procedures as the cause of abnormal reaction of the patient, or of later complication, without mention of misadventure at the time of the procedure: Secondary | ICD-10-CM | POA: Diagnosis present

## 2017-11-04 DIAGNOSIS — R8271 Bacteriuria: Secondary | ICD-10-CM | POA: Diagnosis present

## 2017-11-04 DIAGNOSIS — D649 Anemia, unspecified: Secondary | ICD-10-CM | POA: Diagnosis present

## 2017-11-04 DIAGNOSIS — R6520 Severe sepsis without septic shock: Secondary | ICD-10-CM | POA: Diagnosis present

## 2017-11-04 DIAGNOSIS — Z5181 Encounter for therapeutic drug level monitoring: Secondary | ICD-10-CM | POA: Diagnosis not present

## 2017-11-04 DIAGNOSIS — C4442 Squamous cell carcinoma of skin of scalp and neck: Secondary | ICD-10-CM | POA: Diagnosis present

## 2017-11-04 DIAGNOSIS — Z800 Family history of malignant neoplasm of digestive organs: Secondary | ICD-10-CM

## 2017-11-04 DIAGNOSIS — L02811 Cutaneous abscess of head [any part, except face]: Secondary | ICD-10-CM | POA: Diagnosis present

## 2017-11-04 DIAGNOSIS — D508 Other iron deficiency anemias: Secondary | ICD-10-CM | POA: Diagnosis present

## 2017-11-04 DIAGNOSIS — I340 Nonrheumatic mitral (valve) insufficiency: Secondary | ICD-10-CM | POA: Diagnosis not present

## 2017-11-04 DIAGNOSIS — Z833 Family history of diabetes mellitus: Secondary | ICD-10-CM

## 2017-11-04 DIAGNOSIS — E1169 Type 2 diabetes mellitus with other specified complication: Secondary | ICD-10-CM | POA: Diagnosis not present

## 2017-11-04 DIAGNOSIS — R7881 Bacteremia: Secondary | ICD-10-CM

## 2017-11-04 DIAGNOSIS — R509 Fever, unspecified: Secondary | ICD-10-CM | POA: Diagnosis not present

## 2017-11-04 DIAGNOSIS — E1122 Type 2 diabetes mellitus with diabetic chronic kidney disease: Secondary | ICD-10-CM | POA: Diagnosis present

## 2017-11-04 DIAGNOSIS — R0902 Hypoxemia: Secondary | ICD-10-CM

## 2017-11-04 DIAGNOSIS — G9341 Metabolic encephalopathy: Secondary | ICD-10-CM | POA: Diagnosis present

## 2017-11-04 DIAGNOSIS — T8144XA Sepsis following a procedure, initial encounter: Secondary | ICD-10-CM | POA: Diagnosis present

## 2017-11-04 DIAGNOSIS — N183 Chronic kidney disease, stage 3 unspecified: Secondary | ICD-10-CM | POA: Diagnosis present

## 2017-11-04 DIAGNOSIS — L03811 Cellulitis of head [any part, except face]: Secondary | ICD-10-CM | POA: Diagnosis present

## 2017-11-04 DIAGNOSIS — A4101 Sepsis due to Methicillin susceptible Staphylococcus aureus: Secondary | ICD-10-CM | POA: Diagnosis present

## 2017-11-04 DIAGNOSIS — B9562 Methicillin resistant Staphylococcus aureus infection as the cause of diseases classified elsewhere: Secondary | ICD-10-CM | POA: Diagnosis not present

## 2017-11-04 DIAGNOSIS — E785 Hyperlipidemia, unspecified: Secondary | ICD-10-CM | POA: Diagnosis present

## 2017-11-04 DIAGNOSIS — Z953 Presence of xenogenic heart valve: Secondary | ICD-10-CM

## 2017-11-04 DIAGNOSIS — T8141XA Infection following a procedure, superficial incisional surgical site, initial encounter: Secondary | ICD-10-CM | POA: Diagnosis not present

## 2017-11-04 DIAGNOSIS — R739 Hyperglycemia, unspecified: Secondary | ICD-10-CM

## 2017-11-04 DIAGNOSIS — J9611 Chronic respiratory failure with hypoxia: Secondary | ICD-10-CM | POA: Diagnosis present

## 2017-11-04 DIAGNOSIS — Z8589 Personal history of malignant neoplasm of other organs and systems: Secondary | ICD-10-CM | POA: Diagnosis not present

## 2017-11-04 DIAGNOSIS — Z6841 Body Mass Index (BMI) 40.0 and over, adult: Secondary | ICD-10-CM | POA: Diagnosis not present

## 2017-11-04 DIAGNOSIS — J9601 Acute respiratory failure with hypoxia: Secondary | ICD-10-CM | POA: Diagnosis present

## 2017-11-04 DIAGNOSIS — Z8249 Family history of ischemic heart disease and other diseases of the circulatory system: Secondary | ICD-10-CM

## 2017-11-04 DIAGNOSIS — Z885 Allergy status to narcotic agent status: Secondary | ICD-10-CM | POA: Diagnosis not present

## 2017-11-04 DIAGNOSIS — D696 Thrombocytopenia, unspecified: Secondary | ICD-10-CM | POA: Diagnosis present

## 2017-11-04 DIAGNOSIS — Z66 Do not resuscitate: Secondary | ICD-10-CM | POA: Diagnosis present

## 2017-11-04 DIAGNOSIS — Z7982 Long term (current) use of aspirin: Secondary | ICD-10-CM

## 2017-11-04 DIAGNOSIS — I2510 Atherosclerotic heart disease of native coronary artery without angina pectoris: Secondary | ICD-10-CM | POA: Diagnosis present

## 2017-11-04 DIAGNOSIS — E669 Obesity, unspecified: Secondary | ICD-10-CM | POA: Diagnosis present

## 2017-11-04 DIAGNOSIS — M109 Gout, unspecified: Secondary | ICD-10-CM | POA: Diagnosis present

## 2017-11-04 DIAGNOSIS — B9561 Methicillin susceptible Staphylococcus aureus infection as the cause of diseases classified elsewhere: Secondary | ICD-10-CM | POA: Diagnosis not present

## 2017-11-04 DIAGNOSIS — Z9889 Other specified postprocedural states: Secondary | ICD-10-CM | POA: Diagnosis not present

## 2017-11-04 DIAGNOSIS — Z853 Personal history of malignant neoplasm of breast: Secondary | ICD-10-CM

## 2017-11-04 DIAGNOSIS — R4182 Altered mental status, unspecified: Secondary | ICD-10-CM | POA: Diagnosis not present

## 2017-11-04 DIAGNOSIS — I10 Essential (primary) hypertension: Secondary | ICD-10-CM | POA: Diagnosis present

## 2017-11-04 DIAGNOSIS — Z954 Presence of other heart-valve replacement: Secondary | ICD-10-CM | POA: Diagnosis not present

## 2017-11-04 DIAGNOSIS — E1165 Type 2 diabetes mellitus with hyperglycemia: Secondary | ICD-10-CM | POA: Diagnosis present

## 2017-11-04 DIAGNOSIS — T8140XA Infection following a procedure, unspecified, initial encounter: Secondary | ICD-10-CM | POA: Diagnosis not present

## 2017-11-04 DIAGNOSIS — Z952 Presence of prosthetic heart valve: Secondary | ICD-10-CM

## 2017-11-04 DIAGNOSIS — Z7902 Long term (current) use of antithrombotics/antiplatelets: Secondary | ICD-10-CM

## 2017-11-04 DIAGNOSIS — Z888 Allergy status to other drugs, medicaments and biological substances status: Secondary | ICD-10-CM | POA: Diagnosis not present

## 2017-11-04 DIAGNOSIS — I5032 Chronic diastolic (congestive) heart failure: Secondary | ICD-10-CM | POA: Diagnosis present

## 2017-11-04 DIAGNOSIS — Z96653 Presence of artificial knee joint, bilateral: Secondary | ICD-10-CM | POA: Diagnosis present

## 2017-11-04 HISTORY — DX: Type 2 diabetes mellitus without complications: E11.9

## 2017-11-04 HISTORY — DX: Malignant neoplasm of unspecified site of left female breast: C50.912

## 2017-11-04 HISTORY — DX: Personal history of other medical treatment: Z92.89

## 2017-11-04 HISTORY — DX: Reserved for concepts with insufficient information to code with codable children: IMO0002

## 2017-11-04 LAB — COMPREHENSIVE METABOLIC PANEL
ALBUMIN: 3 g/dL — AB (ref 3.5–5.0)
ALK PHOS: 105 U/L (ref 38–126)
ALT: 18 U/L (ref 14–54)
ANION GAP: 10 (ref 5–15)
AST: 22 U/L (ref 15–41)
BUN: 28 mg/dL — ABNORMAL HIGH (ref 6–20)
CALCIUM: 8.9 mg/dL (ref 8.9–10.3)
CHLORIDE: 107 mmol/L (ref 101–111)
CO2: 20 mmol/L — AB (ref 22–32)
CREATININE: 1.36 mg/dL — AB (ref 0.44–1.00)
GFR calc non Af Amer: 36 mL/min — ABNORMAL LOW (ref >60)
GFR, EST AFRICAN AMERICAN: 42 mL/min — AB (ref >60)
GLUCOSE: 207 mg/dL — AB (ref 65–99)
Potassium: 3.8 mmol/L (ref 3.5–5.1)
Sodium: 137 mmol/L (ref 135–145)
Total Bilirubin: 1.1 mg/dL (ref 0.3–1.2)
Total Protein: 6.1 g/dL — ABNORMAL LOW (ref 6.5–8.1)

## 2017-11-04 LAB — CBC WITH DIFFERENTIAL/PLATELET
ABS IMMATURE GRANULOCYTES: 0.2 10*3/uL — AB (ref 0.0–0.1)
Basophils Absolute: 0 10*3/uL (ref 0.0–0.1)
Basophils Relative: 0 %
Eosinophils Absolute: 0 10*3/uL (ref 0.0–0.7)
Eosinophils Relative: 0 %
HEMATOCRIT: 32.3 % — AB (ref 36.0–46.0)
HEMOGLOBIN: 10.5 g/dL — AB (ref 12.0–15.0)
IMMATURE GRANULOCYTES: 2 %
LYMPHS ABS: 0.3 10*3/uL — AB (ref 0.7–4.0)
LYMPHS PCT: 3 %
MCH: 31.9 pg (ref 26.0–34.0)
MCHC: 32.5 g/dL (ref 30.0–36.0)
MCV: 98.2 fL (ref 78.0–100.0)
MONOS PCT: 8 %
Monocytes Absolute: 0.9 10*3/uL (ref 0.1–1.0)
NEUTROS PCT: 87 %
Neutro Abs: 9.4 10*3/uL — ABNORMAL HIGH (ref 1.7–7.7)
Platelets: 93 10*3/uL — ABNORMAL LOW (ref 150–400)
RBC: 3.29 MIL/uL — AB (ref 3.87–5.11)
RDW: 14 % (ref 11.5–15.5)
WBC: 10.8 10*3/uL — AB (ref 4.0–10.5)

## 2017-11-04 LAB — URINALYSIS, ROUTINE W REFLEX MICROSCOPIC
Bilirubin Urine: NEGATIVE
Glucose, UA: NEGATIVE mg/dL
Ketones, ur: 20 mg/dL — AB
Nitrite: NEGATIVE
PH: 5 (ref 5.0–8.0)
Protein, ur: >=300 mg/dL — AB
SPECIFIC GRAVITY, URINE: 1.018 (ref 1.005–1.030)

## 2017-11-04 LAB — PROTIME-INR
INR: 1.2
Prothrombin Time: 15.1 seconds (ref 11.4–15.2)

## 2017-11-04 LAB — I-STAT CG4 LACTIC ACID, ED
LACTIC ACID, VENOUS: 0.93 mmol/L (ref 0.5–1.9)
Lactic Acid, Venous: 1.01 mmol/L (ref 0.5–1.9)

## 2017-11-04 LAB — GLUCOSE, CAPILLARY: GLUCOSE-CAPILLARY: 146 mg/dL — AB (ref 65–99)

## 2017-11-04 LAB — BRAIN NATRIURETIC PEPTIDE: B NATRIURETIC PEPTIDE 5: 384 pg/mL — AB (ref 0.0–100.0)

## 2017-11-04 MED ORDER — LACTATED RINGERS IV SOLN
INTRAVENOUS | Status: DC
  Administered 2017-11-04 – 2017-11-05 (×2): via INTRAVENOUS

## 2017-11-04 MED ORDER — LIDOCAINE-EPINEPHRINE (PF) 2 %-1:200000 IJ SOLN
10.0000 mL | Freq: Once | INTRAMUSCULAR | Status: DC
  Filled 2017-11-04: qty 20

## 2017-11-04 MED ORDER — ROSUVASTATIN CALCIUM 10 MG PO TABS
10.0000 mg | ORAL_TABLET | Freq: Every day | ORAL | Status: DC
  Administered 2017-11-05 – 2017-11-10 (×5): 10 mg via ORAL
  Filled 2017-11-04 (×6): qty 1

## 2017-11-04 MED ORDER — SODIUM CHLORIDE 0.9 % IV BOLUS
500.0000 mL | Freq: Once | INTRAVENOUS | Status: AC
  Administered 2017-11-04: 500 mL via INTRAVENOUS

## 2017-11-04 MED ORDER — COLCHICINE 0.6 MG PO TABS
0.6000 mg | ORAL_TABLET | Freq: Every day | ORAL | Status: DC
  Administered 2017-11-05 – 2017-11-10 (×4): 0.6 mg via ORAL
  Filled 2017-11-04 (×6): qty 1

## 2017-11-04 MED ORDER — SODIUM CHLORIDE 0.9 % IV BOLUS
1000.0000 mL | Freq: Once | INTRAVENOUS | Status: AC
  Administered 2017-11-04: 1000 mL via INTRAVENOUS

## 2017-11-04 MED ORDER — METOPROLOL TARTRATE 12.5 MG HALF TABLET
12.5000 mg | ORAL_TABLET | Freq: Two times a day (BID) | ORAL | Status: DC
  Administered 2017-11-05 – 2017-11-10 (×11): 12.5 mg via ORAL
  Filled 2017-11-04 (×11): qty 1

## 2017-11-04 MED ORDER — ASPIRIN 81 MG PO CHEW
81.0000 mg | CHEWABLE_TABLET | Freq: Every day | ORAL | Status: DC
  Administered 2017-11-05 – 2017-11-10 (×6): 81 mg via ORAL
  Filled 2017-11-04 (×6): qty 1

## 2017-11-04 MED ORDER — ALLOPURINOL 300 MG PO TABS
300.0000 mg | ORAL_TABLET | Freq: Every day | ORAL | Status: DC
  Administered 2017-11-05 – 2017-11-10 (×6): 300 mg via ORAL
  Filled 2017-11-04 (×6): qty 1

## 2017-11-04 MED ORDER — CLOPIDOGREL BISULFATE 75 MG PO TABS
75.0000 mg | ORAL_TABLET | Freq: Every day | ORAL | Status: DC
  Administered 2017-11-05 – 2017-11-10 (×5): 75 mg via ORAL
  Filled 2017-11-04 (×5): qty 1

## 2017-11-04 MED ORDER — VANCOMYCIN HCL 10 G IV SOLR
1250.0000 mg | INTRAVENOUS | Status: DC
  Administered 2017-11-04: 1250 mg via INTRAVENOUS
  Filled 2017-11-04: qty 1250

## 2017-11-04 MED ORDER — ACETAMINOPHEN 650 MG RE SUPP: 650.0000 mg | Freq: Four times a day (QID) | RECTAL | Status: DC | PRN

## 2017-11-04 MED ORDER — ACETAMINOPHEN 325 MG PO TABS
650.0000 mg | ORAL_TABLET | Freq: Four times a day (QID) | ORAL | Status: DC | PRN
  Administered 2017-11-04 – 2017-11-10 (×5): 650 mg via ORAL
  Filled 2017-11-04 (×7): qty 2

## 2017-11-04 MED ORDER — ONDANSETRON HCL 4 MG PO TABS: 4.0000 mg | ORAL_TABLET | Freq: Four times a day (QID) | ORAL | Status: DC | PRN

## 2017-11-04 MED ORDER — ONDANSETRON HCL 4 MG/2ML IJ SOLN: 4.0000 mg | Freq: Four times a day (QID) | INTRAMUSCULAR | Status: DC | PRN

## 2017-11-04 MED ORDER — INSULIN ASPART 100 UNIT/ML ~~LOC~~ SOLN
0.0000 [IU] | Freq: Three times a day (TID) | SUBCUTANEOUS | Status: DC
  Administered 2017-11-05 (×2): 3 [IU] via SUBCUTANEOUS
  Administered 2017-11-05: 2 [IU] via SUBCUTANEOUS
  Administered 2017-11-06 (×2): 5 [IU] via SUBCUTANEOUS
  Administered 2017-11-06 – 2017-11-07 (×2): 3 [IU] via SUBCUTANEOUS
  Administered 2017-11-07: 2 [IU] via SUBCUTANEOUS
  Administered 2017-11-07 – 2017-11-08 (×2): 3 [IU] via SUBCUTANEOUS
  Administered 2017-11-08: 2 [IU] via SUBCUTANEOUS
  Administered 2017-11-08 – 2017-11-09 (×2): 3 [IU] via SUBCUTANEOUS
  Administered 2017-11-09 – 2017-11-10 (×2): 2 [IU] via SUBCUTANEOUS

## 2017-11-04 MED ORDER — FERROUS SULFATE 325 (65 FE) MG PO TABS
325.0000 mg | ORAL_TABLET | Freq: Every day | ORAL | Status: DC
  Administered 2017-11-05 – 2017-11-10 (×5): 325 mg via ORAL
  Filled 2017-11-04 (×5): qty 1

## 2017-11-04 MED ORDER — DOCUSATE SODIUM 100 MG PO CAPS
100.0000 mg | ORAL_CAPSULE | Freq: Two times a day (BID) | ORAL | Status: DC
  Administered 2017-11-04 – 2017-11-10 (×12): 100 mg via ORAL
  Filled 2017-11-04 (×12): qty 1

## 2017-11-04 MED ORDER — LIDOCAINE-EPINEPHRINE-TETRACAINE (LET) SOLUTION
3.0000 mL | Freq: Once | NASAL | Status: DC
  Filled 2017-11-04: qty 3

## 2017-11-04 MED ORDER — TRAMADOL HCL 50 MG PO TABS
50.0000 mg | ORAL_TABLET | Freq: Four times a day (QID) | ORAL | Status: DC | PRN
  Administered 2017-11-07: 50 mg via ORAL
  Filled 2017-11-04 (×3): qty 1

## 2017-11-04 MED ORDER — INSULIN ASPART 100 UNIT/ML ~~LOC~~ SOLN
0.0000 [IU] | Freq: Every day | SUBCUTANEOUS | Status: DC
  Administered 2017-11-05: 3 [IU] via SUBCUTANEOUS

## 2017-11-04 NOTE — ED Triage Notes (Addendum)
Pt presents via [facility] for evaluation of altered mental status. Pt last seen at baseline mental status yesterday. Pt had skin cancer procedure to top of head on Thursday, pt reports it was cleaned on Saturday. Site is red and has drainage today. Pt O2 sats 88% on EMS arrival. Reports dark odorous urine today. EMS gave 1000 mg tylenol for 102 temperature.

## 2017-11-04 NOTE — ED Provider Notes (Signed)
[facility] EMERGENCY DEPARTMENT Provider Note   CSN: [id number] Arrival date & time: 11/04/17  1041     History   Chief Complaint Chief Complaint  Patient presents with  . Altered Mental Status    HPI Carrie Monroe is a 82 y.o. female who presents with AMS. PMH signficant for CAD, CHF, hx of aortic stenosis s/p TAVR, CKD, hx of breast cancer, DM, obesity, HLD, HTN, sqamous cell carcinoma. She states she had a local resection of SCC on her scalp 4 days ago by dermatology. She had a lot of bleeding from the wound. She went back to her doctor on Saturday and it was cleaned. Her son and a friend are at bedside who state the the patient hasn't been acting herself this morning. The friend called EMS because the patient couldn't get up. The area on her scalp is red and tender. She was also noted to be mildly hypoxic to 88%. She's had slightly increased WOB per her friend and malodourous, dark urine. The patient denies chest pain, SOB, abdominal pain, N/V/D.   HPI  Past Medical History:  Diagnosis Date  . Aortic stenosis, severe    a. 01/2017: s/p TAVR; hospital course complicated by large groin hematoma/wound  . Arthritis   . Breast cancer ([place])    a. L breast cancer s/p lumpectomy and XRT  . Carotid artery stenosis    a. 11/2016: 80-99% RICA stenosis, 1-39% vs low range 08-67% LICA   . Chronic diastolic CHF (congestive heart failure) ([place])   . CKD (chronic kidney disease)   . Coronary artery disease    a. 11/2016: diagnosed with multivessel CAD, turned down for CABG and underwent PCI/DES to mLCx, PCI/DES to mLAD and PCTA of ostial diagonal on 12/28/16  . Diabetes mellitus    a. diet controlled   . Exogenous obesity   . Gout   . Hemorrhoids   . History of kidney stones   . Hyperlipidemia   . Hypertension   . 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 Anemia 09/27/2017  . Hypertension   . Hyperlipidemia   . History of kidney stones   . Hemorrhoids   . Exogenous obesity   . CKD (chronic kidney disease)   . Chronic diastolic CHF (congestive heart failure) ([place])   . Breast cancer ([place])   . Arthritis   . Aortic stenosis, severe   . Wound infection 04/03/2017  . Acute respiratory failure with hypoxia ([place])   . Hemorrhagic shock ([place])   . 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 Carotid artery stenosis   . Coronary artery disease   . Chronic renal insufficiency, stage III (moderate) ([place]) 12/22/2016  . Dental abscess- s/p multiple tooth extraction 12/21/16 12/22/2016  . Essential hypertension 01/27/2016  . Morbid obesity due to excess calories ([place]) 01/27/2016  . Gout 06/22/2013  . Dyslipidemia 05/08/2011  . Diabetes mellitus type 2 in obese ([place]) 05/08/2011  . Chronic diastolic heart failure ([place]) 05/08/2011  . Severe aortic stenosis 05/08/2011  . Breast cancer of upper-outer quadrant of left female breast ([place]) 03/16/2011    Past Surgical History:  Procedure Laterality Date  . APPLICATION OF WOUND VAC Left 02/05/2017   Procedure: APPLICATION OF WOUND VAC;  Surgeon: Serafina Mitchell, MD;  Location: [facility];  Service: Vascular;  Laterality: Left;  . APPLICATION OF WOUND VAC Left 02/22/2017   Procedure: APPLICATION OF WOUND VAC LEFT GROIN;  Surgeon: Serafina Mitchell, MD;  Location: [facility];  Service: Vascular;  Laterality: Left;  . APPLICATION OF WOUND VAC Left 04/04/2017   Procedure: APPLICATION OF WOUND VAC;  Surgeon: Serafina Mitchell, MD;  Location: [facility];  Service: Vascular;  Laterality: Left;  . BREAST LUMPECTOMY  2009   left  . BREAST LUMPECTOMY WITH NEEDLE LOCALIZATION Left 01/06/2015   Procedure: BREAST BIOPSY WITH NEEDLE LOCALIZATION AND SKIN BIOPSY;  Surgeon: Autumn Messing III, MD;  Location: [facility];   Service: General;  Laterality: Left;  . CATARACT EXTRACTION    . CHOLECYSTECTOMY  2010  . CORONARY STENT INTERVENTION N/A 12/28/2016   Procedure: Coronary Stent Intervention;  Surgeon: Burnell Blanks, MD;  Location: [facility];  Service: Cardiovascular;  Laterality: N/A;  . EYE SURGERY     BILATERAL CATARACT EXTRACTIONS AND LENS IMPLANTS  . FEMORAL ARTERY EXPLORATION N/A 02/05/2017   Procedure: Evacuation of Retroperitoneal Hematoma and Primary Repair of Femoral Artery and FEMORAL ARTERY EXPLORATION;  Surgeon: Serafina Mitchell, MD;  Location: [facility];  Service: Vascular;  Laterality: N/A;  . HEMATOMA EVACUATION Left 02/08/2017   Procedure: EVACUATION HEMATOMA;  Surgeon: Rosetta Posner, MD;  Location: [facility];  Service: Vascular;  Laterality: Left;  . I&D EXTREMITY Left 02/22/2017   Procedure: [place];  Surgeon: Serafina Mitchell, MD;  Location: [facility];  Service: Vascular;  Laterality: Left;  . I&D EXTREMITY Left 04/04/2017   Procedure: [place];  Surgeon: Serafina Mitchell, MD;  Location: [facility];  Service: Vascular;  Laterality: Left;  . KNEE SURGERY     right PARTIAL KNEE REPLACEMENT  . LEFT HEART CATH AND CORONARY ANGIOGRAPHY N/A 12/17/2016   Procedure: Left Heart Cath and Coronary Angiography;  Surgeon: Burnell Blanks, MD;  Location: [facility];  Service: Cardiovascular;  Laterality: N/A;  . MULTIPLE EXTRACTIONS WITH ALVEOLOPLASTY N/A 12/21/2016   Procedure: Extraction of tooth #'s 12, 23,24,25,26 and 29 with alveoloplasty and gross debridement of remaining teeth.;  Surgeon: Lenn Cal, DDS;  Location: [facility];  Service: Oral Surgery;  Laterality: N/A;  . SHOULDER SURGERY     right  . SKIN GRAFT TO RIGHT HAND     AFTER BURN TO THE HAND  . TEE WITHOUT CARDIOVERSION N/A 02/05/2017   Procedure: TRANSESOPHAGEAL ECHOCARDIOGRAM (TEE);  Surgeon: Burnell Blanks, MD;  Location: [facility];  Service: Open Heart  Surgery;  Laterality: N/A;  . TOTAL KNEE ARTHROPLASTY Left 08/03/2013   Procedure: TOTAL LEFT KNEE ARTHROPLASTY;  Surgeon: Gearlean Alf, MD;  Location: [facility];  Service: Orthopedics;  Laterality: Left;  . TRANSCATHETER AORTIC VALVE REPLACEMENT, TRANSFEMORAL N/A 02/05/2017   Procedure: TRANSCATHETER AORTIC VALVE REPLACEMENT, TRANSFEMORAL;  Surgeon: Burnell Blanks, MD;  Location: [facility];  Service: Open Heart Surgery;  Laterality: N/A;  . US ECHOCARDIOGRAPHY  05/15/2010   EF 60-65%     OB History   None      Home Medications    Prior to Admission medications   Medication Sig Start Date End Date Taking? Authorizing Provider  allopurinol (ZYLOPRIM) 300 MG tablet Take 300 mg by mouth daily.     [provider]  aspirin 81 MG chewable tablet Chew 1 tablet (81 mg total) by mouth daily. 12/23/16   Erlene Quan, PA-C  Cholecalciferol (VITAMIN D PO) Take 1 capsule by mouth daily.    [provider]  clopidogrel (PLAVIX) 75 MG tablet Take 1 tablet (75 mg total) by mouth daily with breakfast. 12/30/16   Eileen Stanford, PA-C  COLCRYS 0.6 MG tablet  07/01/17   [provider]  COLLAGEN PO Take by mouth daily. power     [provider]  ferrous sulfate (FERROUSUL) 325 (65 FE) MG tablet Take 1 tablet (325 mg total) 3 (three) times daily with meals by mouth. Patient taking differently: Take 325 mg by mouth daily with breakfast.  04/05/17   Patrecia Pour, Christean Grief, MD  furosemide (LASIX) 40 MG tablet Take 1 tablet (40 mg total) by mouth daily as needed for fluid or edema. 06/11/17   Croitoru, Mihai, MD  hydrALAZINE (APRESOLINE) 50 MG tablet Take 50 mg 2 (two) times daily by mouth.     [provider]  metoprolol tartrate (LOPRESSOR) 25 MG tablet Take 0.5 tablets (12.5 mg total) by mouth 2 (two) times daily. 06/11/17   Croitoru, Mihai, MD  rosuvastatin (CRESTOR) 10 MG tablet Take 10 mg by mouth daily.  01/17/16   [provider]    Family  History Family History  Problem Relation Age of Onset  . Cancer Mother        pancreatic  . Heart disease Father   . Hypertension Father   . Cancer Brother   . Diabetes Sister     Social History Social History   Tobacco Use  . Smoking status: Never Smoker  . Smokeless tobacco: Never Used  Substance Use Topics  . Alcohol use: No  . Drug use: No     Allergies   Oxycodone   Review of Systems Review of Systems  Constitutional: Positive for fatigue and fever.  HENT: Negative for congestion and rhinorrhea.   Respiratory: Negative for cough and shortness of breath.   Cardiovascular: Negative for chest pain and leg swelling.  Gastrointestinal: Negative for abdominal pain, diarrhea, nausea and vomiting.  Genitourinary: Negative for dysuria.       +malodourous urine  Skin: Positive for wound.  All other systems reviewed and are negative.    Physical Exam Updated Vital Signs BP (!) 140/55   Pulse 95   Temp (!) 101.8 F (38.8 C) (Rectal)   Resp 19   Ht 5\' 3"  (1.6 m)   Wt 104.3 kg (230 lb)   SpO2 95%   BMI 40.74 kg/m   Physical Exam  Constitutional: She is oriented to person, place, and time. She appears well-developed and well-nourished. No distress.  Obese female in NAD. Appears fatigued but answers questions appropriately.  HENT:  Head: Normocephalic and atraumatic.  Wound on the scalp appears infected. There is purulent drainage and redness surrounding the incision  Eyes: Pupils are equal, round, and reactive to light. Conjunctivae are normal. Right eye exhibits no discharge. Left eye exhibits no discharge. No scleral icterus.  Neck: Normal range of motion.  Cardiovascular: Normal rate and regular rhythm.  Pulmonary/Chest: Effort normal and breath sounds normal. No respiratory distress.  Abdominal: Soft. Bowel sounds are normal. She exhibits no distension. There is no tenderness.  Neurological: She is alert and oriented to person, place, and time.  Skin: Skin  is warm and dry.  Psychiatric: She has a normal mood and affect. Her behavior is normal.  Nursing note and vitals reviewed.    ED Treatments / Results  Labs (all labs ordered are listed, but only abnormal results  are displayed) Labs Reviewed  COMPREHENSIVE METABOLIC PANEL - Abnormal; Notable for the following components:      Result Value   CO2 20 (*)    Glucose, Bld 207 (*)    BUN 28 (*)    Creatinine, Ser 1.36 (*)    Total Protein 6.1 (*)    Albumin 3.0 (*)    GFR calc non Af Amer 36 (*)    GFR calc Af Amer 42 (*)    All other components within normal limits  CBC WITH DIFFERENTIAL/PLATELET - Abnormal; Notable for the following components:   WBC 10.8 (*)    RBC 3.29 (*)    Hemoglobin 10.5 (*)    HCT 32.3 (*)    Platelets 93 (*)    Neutro Abs 9.4 (*)    Lymphs Abs 0.3 (*)    Abs Immature Granulocytes 0.2 (*)    All other components within normal limits  URINALYSIS, ROUTINE W REFLEX MICROSCOPIC - Abnormal; Notable for the following components:   APPearance HAZY (*)    Hgb urine dipstick MODERATE (*)    Ketones, ur 20 (*)    Protein, ur >=300 (*)    Leukocytes, UA TRACE (*)    Bacteria, UA RARE (*)    All other components within normal limits  CULTURE, BLOOD (ROUTINE X 2)  CULTURE, BLOOD (ROUTINE X 2)  URINE CULTURE  PROTIME-INR  BRAIN NATRIURETIC PEPTIDE  I-STAT CG4 LACTIC ACID, ED  I-STAT CG4 LACTIC ACID, ED    EKG EKG Interpretation  Date/Time:  Monday November 04 2017 10:55:26 EDT Ventricular Rate:  104 PR Interval:    QRS Duration: 156 QT Interval:  365 QTC Calculation: 481 R Axis:   -51 Text Interpretation:  Sinus tachycardia with irregular rate Left bundle branch block rate is faster compared to 2018 Confirmed by Sherwood Gambler [phone]) on 11/04/2017 10:59:55 AM   Radiology Dg Chest 2 View  Result Date: 11/04/2017 CLINICAL DATA:  Altered mental status. EXAM: CHEST - 2 VIEW COMPARISON:  02/07/2017 FINDINGS: There is no focal parenchymal opacity. There is  no pleural effusion or pneumothorax. There is stable cardiomegaly. There is thoracic aortic atherosclerosis. Mild osteoarthritis of the right glenohumeral joint. IMPRESSION: No acute cardiopulmonary disease. Electronically Signed   By: Kathreen Devoid   On: 11/04/2017 11:40    Procedures .Marland KitchenIncision and Drainage Date/Time: 11/04/2017 4:21 PM Performed by: Recardo Evangelist, PA-C Authorized by: Recardo Evangelist, PA-C   Consent:    Consent obtained:  Verbal   Consent given by:  Patient   Risks discussed:  Bleeding, incomplete drainage and pain   Alternatives discussed:  No treatment Location:    Type:  Abscess   Size:  2cm   Location:  Head   Head location:  Scalp Pre-procedure details:    Skin preparation:  Antiseptic wash Anesthesia (see MAR for exact dosages):    Anesthesia method:  Topical application and local infiltration   Topical anesthetic:  LET   Local anesthetic:  Lidocaine 2% WITH epi Procedure type:    Complexity:  Simple Procedure details:    Needle aspiration: no     Incision types:  Stab incision   Incision depth:  Dermal   Scalpel blade:  11   Wound management:  Probed and deloculated and irrigated with saline   Drainage:  Purulent   Drainage amount:  Moderate   Wound treatment:  Wound left open   Packing materials:  None Post-procedure details:    Patient tolerance of procedure:  Tolerated well, no immediate complications .Suture Removal Date/Time: 11/04/2017 4:23 PM Performed by: Recardo Evangelist, PA-C Authorized by: Recardo Evangelist, PA-C   Consent:    Consent obtained:  Verbal   Consent given by:  Patient   Risks discussed:  Bleeding and pain   Alternatives discussed:  No treatment Location:    Location:  Head/neck   Head/neck location:  Scalp Procedure details:    Wound appearance:  Purulent and red   Number of sutures removed:  7 Post-procedure details:    Post-removal:  No dressing applied   Patient tolerance of procedure:  Tolerated  well, no immediate complications     (including critical care time)    Medications Ordered in ED Medications  vancomycin (VANCOCIN) 1,250 mg in sodium chloride 0.9 % 250 mL IVPB (1,250 mg Intravenous New Bag/Given 11/04/17 1342)  lidocaine-EPINEPHrine-tetracaine (LET) solution (has no administration in time range)  lidocaine-EPINEPHrine (XYLOCAINE W/EPI) 2 %-1:200000 (PF) injection 10 mL (has no administration in time range)  sodium chloride 0.9 % bolus 1,000 mL (0 mLs Intravenous Stopped 11/04/17 1342)     Initial Impression / Assessment and Plan / ED Course  I have reviewed the triage vital signs and the nursing notes.  Pertinent labs & imaging results that were available during my care of the patient were reviewed by me and considered in my medical decision making (see chart for details).  82 year old female presents with fever and evidence of cellulitis with abscess of her scalp wound. She is febrile to 101.8 on arrival to the ED. She did receive Tylenol by EMS. She is also noted to be mildly hypoxic although she denies URI symptoms, cough, SOB, or chest pain. Will obtain labs, lactic acid, blood cx, UA, CXR, EKG.  CBC is remarkable for mild leukocytosis of 10.8. She also has mild anemia which is around baseline. CMP is remarkable for hyperglycemia and elevated SCr from her CKD which is also at baseline. Lactic acid is normal. EKG is sinus tachycardia with LBBB. CXR is negative. UA is remarkable for moderate hgb, 20 ketones, >300 protein, trace leukocytes, rare bacteria and was a cathed specimen.   Will give dose of Vancomycin to cover for MRSA. The stitches on her scalp were removed and it was I&D which yielded a good amount of purulent drainage. Shared visit with Dr. Regenia Skeeter. Discussed with Dr. Lorin Mercy with [facility] who will admit.  Final Clinical Impressions(s) / ED Diagnoses   Final diagnoses:  Cellulitis and abscess of head  Hypoxia  Hyperglycemia  Chronic kidney disease,  unspecified CKD stage    ED Discharge Orders    None       Recardo Evangelist, PA-C 11/04/17 1623    Sherwood Gambler, MD 11/05/17 1035

## 2017-11-04 NOTE — H&P (Signed)
History and Physical    Carrie Monroe CBJ:[id number] DOB: 10/12/1935 DOA: 11/04/2017  PCP: Marton Redwood, MD Consultants:  Trula Slade - vascular; [facility] - cardiology; [facility] - skin surgery center Patient coming from:  Home - lives alone; NOK: Daughter, [phone]  Chief Complaint:  AMS  HPI: Carrie Monroe is a 82 y.o. female with medical history significant of s/p TAVR; HTN; HLD; DM; CAD; chronic diastolic CHF; carotid stenosis; and breast CA s/p lumpectomy and XRT presenting with AMS.  She had skin surgery on Thursday.  She "took a turn for the worse" Saturday.  She took oxycodone and it made her hallucinate.  She was bleeding from the wound and went back to the doctor; he looked at it and said it looked good and instructed her to wash her hair.  She was planning to wash her hair today and she couldn't stand up.  She was not confused.  +fever.   ED Course:  Fever, cellulitis on the top of her head.  H/o SCC removed on Thursday, wound recheck Saturday with bleeding, clots.  Today, a friend checked on her - she was altered, fever, unable to get up.  T101.8, sats 88%.  No respiratory complaints.  Wound is definitely infected, stitches removed, will open it up to help it drain.  WBC 10.8, normal lactate, creatinine at baseline.  Normal CXR.  Review of Systems: As per HPI; otherwise review of systems reviewed and negative.    Past Medical History:  Diagnosis Date  . Aortic stenosis, severe    a. 01/2017: s/p TAVR; hospital course complicated by large groin hematoma/wound  . Arthritis   . Breast cancer ([place])    a. L breast cancer s/p lumpectomy and XRT  . Carotid artery stenosis    a. 11/2016: 80-99% RICA stenosis, 1-39% vs low range 69-48% LICA   . Chronic diastolic CHF (congestive heart failure) ([place])   . CKD (chronic kidney disease)   . Coronary artery disease    a. 11/2016: diagnosed with multivessel CAD, turned down for CABG and underwent PCI/DES to mLCx, PCI/DES to mLAD and  PCTA of ostial diagonal on 12/28/16  . Diabetes mellitus    a. diet controlled   . Exogenous obesity   . Gout   . Hemorrhoids   . History of kidney stones   . Hyperlipidemia   . Hypertension   . 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 APPLICATION OF WOUND VAC Left 02/05/2017   Procedure: APPLICATION OF WOUND VAC;  Surgeon: Serafina Mitchell, MD;  Location: [place];  Service: Vascular;  Laterality: Left;  . APPLICATION OF WOUND VAC Left 02/22/2017   Procedure: APPLICATION OF WOUND VAC LEFT GROIN;  Surgeon: Serafina Mitchell, MD;  Location: [place];  Service: Vascular;  Laterality: Left;  . APPLICATION OF WOUND VAC Left 04/04/2017   Procedure: APPLICATION OF WOUND VAC;  Surgeon: Serafina Mitchell, MD;  Location: [place];  Service: Vascular;  Laterality: Left;  . BREAST LUMPECTOMY  2009   left  . BREAST LUMPECTOMY WITH NEEDLE LOCALIZATION Left 01/06/2015   Procedure: BREAST BIOPSY WITH NEEDLE LOCALIZATION AND SKIN BIOPSY;  Surgeon: Autumn Messing III, MD;  Location: [place];  Service: General;  Laterality: Left;  . CATARACT EXTRACTION    . CHOLECYSTECTOMY  2010  . CORONARY STENT  INTERVENTION N/A 12/28/2016   Procedure: Coronary Stent Intervention;  Surgeon: Burnell Blanks, MD;  Location: [place] [facility];  Service: Cardiovascular;  Laterality: N/A;  . EYE SURGERY     BILATERAL CATARACT EXTRACTIONS AND LENS IMPLANTS  . FEMORAL ARTERY EXPLORATION N/A 02/05/2017   Procedure: Evacuation of Retroperitoneal Hematoma and Primary Repair of Femoral Artery and FEMORAL ARTERY EXPLORATION;  Surgeon: Serafina Mitchell, MD;  Location: [place];  Service: Vascular;  Laterality: N/A;  . HEMATOMA EVACUATION Left 02/08/2017   Procedure: EVACUATION HEMATOMA;  Surgeon: Rosetta Posner, MD;  Location: [place];  Service:  Vascular;  Laterality: Left;  . I&D EXTREMITY Left 02/22/2017   Procedure: [place];  Surgeon: Serafina Mitchell, MD;  Location: [place];  Service: Vascular;  Laterality: Left;  . I&D EXTREMITY Left 04/04/2017   Procedure: [place];  Surgeon: Serafina Mitchell, MD;  Location: [place];  Service: Vascular;  Laterality: Left;  . KNEE SURGERY     right PARTIAL KNEE REPLACEMENT  . LEFT HEART CATH AND CORONARY ANGIOGRAPHY N/A 12/17/2016   Procedure: Left Heart Cath and Coronary Angiography;  Surgeon: Burnell Blanks, MD;  Location: [place] [facility];  Service: Cardiovascular;  Laterality: N/A;  . MULTIPLE EXTRACTIONS WITH ALVEOLOPLASTY N/A 12/21/2016   Procedure: Extraction of tooth #'s 12, 23,24,25,26 and 29 with alveoloplasty and gross debridement of remaining teeth.;  Surgeon: Lenn Cal, DDS;  Location: [place];  Service: Oral Surgery;  Laterality: N/A;  . SHOULDER SURGERY     right  . SKIN GRAFT TO RIGHT HAND     AFTER BURN TO THE HAND  . TEE WITHOUT CARDIOVERSION N/A 02/05/2017   Procedure: TRANSESOPHAGEAL ECHOCARDIOGRAM (TEE);  Surgeon: Burnell Blanks, MD;  Location: [place];  Service: Open Heart Surgery;  Laterality: N/A;  . TOTAL KNEE ARTHROPLASTY Left 08/03/2013   Procedure: TOTAL LEFT KNEE ARTHROPLASTY;  Surgeon: Gearlean Alf, MD;  Location: [facility];  Service: Orthopedics;  Laterality: Left;  . TRANSCATHETER AORTIC VALVE REPLACEMENT, TRANSFEMORAL N/A 02/05/2017   Procedure: TRANSCATHETER AORTIC VALVE REPLACEMENT, TRANSFEMORAL;  Surgeon: Burnell Blanks, MD;  Location: [place];  Service: Open Heart Surgery;  Laterality: N/A;  . US ECHOCARDIOGRAPHY  05/15/2010   EF 60-65%    Social History   Socioeconomic History  . Marital status: Married    Spouse name: Not on file  . Number of children: 4  . Years of education: Not on file  . Highest education level: Not on file  Occupational History  . 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 Financial resource strain: Not on file  . Food insecurity:    Worry: Not on file    Inability: Not on file  . Transportation needs:    Medical: Not on file    Non-medical: Not on file  Tobacco Use  . Smoking status: Never Smoker  . Smokeless tobacco: Never Used  Substance and Sexual Activity  . Alcohol use: No  . Drug use: No  . Sexual activity: Not Currently  Lifestyle  . Physical activity:    Days per week: Not on file    Minutes per session: Not on file  . Stress: Not on file  Relationships  . Social connections:    Talks on phone: Not on file    Gets together: Not on file    Attends religious service: Not on file    Active member of club  or organization: Not on file    Attends meetings of clubs or organizations: Not on file    Relationship status: Not on file  . Intimate partner violence:    Fear of current or ex partner: Not on file    Emotionally abused: Not on file    Physically abused: Not on file    Forced sexual activity: Not on file  Other Topics Concern  . Not on file  Social History Narrative   Epworth Sleepiness scale score =11 as of 01/25/16    Allergies  Allergen Reactions  . Oxycodone Other (See Comments)    Hallunication    Family History  Problem Relation Age of Onset  . Cancer Mother        pancreatic  . Heart disease Father   . Hypertension Father   . Cancer Brother   . Diabetes Sister     Prior to Admission medications   Medication Sig Start Date End Date Taking? Authorizing Provider  allopurinol (ZYLOPRIM) 300 MG tablet Take 300 mg by mouth daily.    Yes [provider]  hydrALAZINE (APRESOLINE) 50 MG tablet Take 50 mg 2 (two) times daily by mouth.    Yes [provider]  aspirin 81 MG chewable tablet Chew 1 tablet (81 mg total) by mouth daily. 12/23/16   Erlene Quan, PA-C  Cholecalciferol (VITAMIN D PO) Take 1 capsule by mouth daily.    [provider]    clopidogrel (PLAVIX) 75 MG tablet Take 1 tablet (75 mg total) by mouth daily with breakfast. 12/30/16   Eileen Stanford, PA-C  COLCRYS 0.6 MG tablet  07/01/17   [provider]  COLLAGEN PO Take by mouth daily. power     [provider]  ferrous sulfate (FERROUSUL) 325 (65 FE) MG tablet Take 1 tablet (325 mg total) 3 (three) times daily with meals by mouth. Patient taking differently: Take 325 mg by mouth daily with breakfast.  04/05/17   Patrecia Pour, Christean Grief, MD  furosemide (LASIX) 40 MG tablet Take 1 tablet (40 mg total) by mouth daily as needed for fluid or edema. 06/11/17   Croitoru, Mihai, MD  metoprolol tartrate (LOPRESSOR) 25 MG tablet Take 0.5 tablets (12.5 mg total) by mouth 2 (two) times daily. 06/11/17   Croitoru, Mihai, MD  rosuvastatin (CRESTOR) 10 MG tablet Take 10 mg by mouth daily.  01/17/16   [provider]    Physical Exam: Vitals:   11/04/17 1415 11/04/17 1430 11/04/17 1642 11/04/17 1718  BP: (!) 123/55 (!) 125/55    Pulse: 72 73    Resp: (!) 22 (!) 22    Temp:   99.5 F (37.5 C) (!) 101.3 F (38.5 C)  TempSrc:   Oral Oral  SpO2: 96% 96%    Weight:      Height:         General: Appears acutely ill - she is shivering and cold, but able to answer questions appropriately Eyes:  PERRL, EOMI, normal lids, iris ENT:  grossly normal hearing, lips & tongue, mmm Neck:  no LAD, masses or thyromegaly Cardiovascular:  RRR, no m/r/g. 1-2+ LE edema.  Respiratory:   CTA bilaterally with no wheezes/rales/rhonchi.  Mildly increased respiratory effort. Abdomen:  soft, NT, ND, NABS Skin:  Roughly 5 cm surgical laceration along the top of her scalp.  There is fluctuance along the superior aspect.  There is significant dried old blood encrusted throughout much of her surrounding hair.  Musculoskeletal:  grossly normal tone BUE/BLE, good ROM, no bony abnormality Lower extremity:  1-2+ LE edema.  Limited foot exam with no ulcerations.  2+ distal  pulses. Psychiatric:  grossly normal mood and affect, speech fluent and appropriate, AOx3 Neurologic:  CN 2-12 grossly intact, moves all extremities in coordinated fashion, sensation intact    Radiological Exams on Admission: Dg Chest 2 View  Result Date: 11/04/2017 CLINICAL DATA:  Altered mental status. EXAM: CHEST - 2 VIEW COMPARISON:  02/07/2017 FINDINGS: There is no focal parenchymal opacity. There is no pleural effusion or pneumothorax. There is stable cardiomegaly. There is thoracic aortic atherosclerosis. Mild osteoarthritis of the right glenohumeral joint. IMPRESSION: No acute cardiopulmonary disease. Electronically Signed   By: Kathreen Devoid   On: 11/04/2017 11:40    EKG: Independently reviewed.  Sinus tachycardia with rate 104; LBBB   Labs on Admission: I have personally reviewed the available labs and imaging studies at the time of the admission.  Pertinent labs:   Lactate 1.01, 0.93 Blood and urine cultures pending CO2 20 Glucose 207 BUN 28/Creatinine 1.36/GFR 36; prior 26/1.28/39 on 1/16 Albumin 3.0 WBC 10.8 Hgb 10.5; 9.4 on 1/16 Platelets 93; normal on 1/16; intermittent thrombocytopenia noted INR 1.20 UA: moderate Hgb, 20 ketones, trace LE, negative nitrites, ?300 protein, rare bacteria  Assessment/Plan Principal Problem:   Wound infection after surgery Active Problems:   Diabetes mellitus type 2 in obese [facility])   Essential hypertension   Chronic renal insufficiency, stage III (moderate) ([facility])   S/P TAVR (transcatheter aortic valve replacement)   Acute respiratory failure with hypoxia ([facility])   Chronic diastolic CHF (congestive heart failure) ([facility])   Anemia   Wound infection after surgery -Patient with SCC removal from her scalp last week -Developed bleeding with clots from the wound on Saturday and was seen; low suspicion for infection at that time, it seems -She presented with AMS today and the wound was clearly infected -Sutures were removed in the ER and  I&D was performed with a moderate amount of purulent drainage removed -She was given Vanc, will continue -She does not exhibit sepsis physiology but does appear to be uncomfortable with fever/chills -Blood and urine cultures are pending.  Acute respiratory failure with hypoxia -Uncertain etiology -While she does have a h/o diastolic CHF, she does not appear to be in heart failure at this time -Imaging does not demonstrate acute disease -However, she did have hypoxia to upper 80s on presentation and she has mild tahcypnea - ?related to fever -Will monitor on O2 for now -There does not appear to be an obvious reason for PE; she does not have apparent DVT; and does not have tachycardia.  Will not scan for PE or check D-dimer at this time.  DM -A1c 6.1 on 4/10 -She appears to be diet controlled -Cover with moderate-scale SSI  HTN -Hold hydralazine due to borderline low BP -Continue Lopressor  Stage 3 CKD -Appears to be stable at this time  S/p TAVR -10/18 Echo "shows an excellent result post TAVR with a mean gradient across the aortic prosthesis of only 3 mm Hg and normal left ventricular systolic function" -She is on ASA and Plavix  Diastolic heart failure -Appears to be compensated at this time despite hypoxia, as noted above -Awaiting BNP -Will follow  Anemia -Appears to be stable -Will follow CBC  DVT prophylaxis: SCDs - due to amount of bleeding from head over the weekend Code Status: DNR - confirmed with patient Family Communication: None present Disposition  Plan:  Home once clinically improved Consults called:  None  Admission status: Admit - It is my clinical opinion that admission to INPATIENT is reasonable and necessary because of the expectation that this patient will require hospital care that crosses at least 2 midnights to treat this condition based on the medical complexity of the problems presented.  Given the aforementioned information, the predictability of an  adverse outcome is felt to be significant.    Karmen Bongo MD [facility]  If note is complete, please contact covering daytime or nighttime physician. [email or web address] Password [facility]  11/04/2017, 5:27 PM

## 2017-11-04 NOTE — ED Notes (Signed)
Pt sats down to 88 % on room air , placed back on 2 liters O2 n/c

## 2017-11-04 NOTE — Progress Notes (Signed)
Pharmacy Antibiotic Note  Carrie Monroe is a 82 y.o. female admitted on 11/04/2017 with cellulitis.  Pharmacy has been consulted for vancomycin dosing. Tmax is 101.8 and WBC is WNL at 10.8. SCr is elevated at 1.36.   Plan: Vancomycin 1250mg  IV Q24H F/u renal fxn, C&S, clinical status and trough at SS  Height: 5\' 3"  (160 cm) Weight: 230 lb (104.3 kg) IBW/kg (Calculated) : 52.4  Temp (24hrs), Avg:101.8 F (38.8 C), Min:101.8 F (38.8 C), Max:101.8 F (38.8 C)  Recent Labs  Lab 11/04/17 1105 11/04/17 1106  WBC 10.8*  --   CREATININE 1.36*  --   LATICACIDVEN  --  1.01    Estimated Creatinine Clearance: 38.8 mL/min (A) (by C-G formula based on SCr of 1.36 mg/dL (H)).    Allergies  Allergen Reactions  . Oxycodone Other (See Comments)    Hallunication    Antimicrobials this admission: Vanc 6/10>>  Dose adjustments this admission: N/A  Microbiology results: Pending  Thank you for allowing pharmacy to be a part of this patient's care.  Dayvon Dax, Rande Lawman 11/04/2017 1:30 PM

## 2017-11-04 NOTE — ED Notes (Signed)
Patient transported to X-ray 

## 2017-11-05 ENCOUNTER — Encounter (HOSPITAL_COMMUNITY): Payer: Self-pay | Admitting: General Surgery

## 2017-11-05 DIAGNOSIS — T8140XA Infection following a procedure, unspecified, initial encounter: Secondary | ICD-10-CM

## 2017-11-05 DIAGNOSIS — R7881 Bacteremia: Secondary | ICD-10-CM

## 2017-11-05 DIAGNOSIS — L03811 Cellulitis of head [any part, except face]: Secondary | ICD-10-CM

## 2017-11-05 DIAGNOSIS — B9561 Methicillin susceptible Staphylococcus aureus infection as the cause of diseases classified elsewhere: Secondary | ICD-10-CM

## 2017-11-05 DIAGNOSIS — Z9889 Other specified postprocedural states: Secondary | ICD-10-CM

## 2017-11-05 DIAGNOSIS — J9601 Acute respiratory failure with hypoxia: Secondary | ICD-10-CM

## 2017-11-05 DIAGNOSIS — R4182 Altered mental status, unspecified: Secondary | ICD-10-CM

## 2017-11-05 DIAGNOSIS — R509 Fever, unspecified: Secondary | ICD-10-CM

## 2017-11-05 DIAGNOSIS — B9562 Methicillin resistant Staphylococcus aureus infection as the cause of diseases classified elsewhere: Secondary | ICD-10-CM

## 2017-11-05 DIAGNOSIS — L02811 Cutaneous abscess of head [any part, except face]: Secondary | ICD-10-CM

## 2017-11-05 DIAGNOSIS — Z8589 Personal history of malignant neoplasm of other organs and systems: Secondary | ICD-10-CM

## 2017-11-05 LAB — BASIC METABOLIC PANEL
ANION GAP: 7 (ref 5–15)
BUN: 28 mg/dL — ABNORMAL HIGH (ref 6–20)
CO2: 21 mmol/L — ABNORMAL LOW (ref 22–32)
Calcium: 8.5 mg/dL — ABNORMAL LOW (ref 8.9–10.3)
Chloride: 106 mmol/L (ref 101–111)
Creatinine, Ser: 1.34 mg/dL — ABNORMAL HIGH (ref 0.44–1.00)
GFR, EST AFRICAN AMERICAN: 42 mL/min — AB (ref >60)
GFR, EST NON AFRICAN AMERICAN: 37 mL/min — AB (ref >60)
Glucose, Bld: 155 mg/dL — ABNORMAL HIGH (ref 65–99)
POTASSIUM: 4.2 mmol/L (ref 3.5–5.1)
SODIUM: 134 mmol/L — AB (ref 135–145)

## 2017-11-05 LAB — CBC
HEMATOCRIT: 30.9 % — AB (ref 36.0–46.0)
HEMOGLOBIN: 9.9 g/dL — AB (ref 12.0–15.0)
MCH: 31.6 pg (ref 26.0–34.0)
MCHC: 32 g/dL (ref 30.0–36.0)
MCV: 98.7 fL (ref 78.0–100.0)
Platelets: 82 10*3/uL — ABNORMAL LOW (ref 150–400)
RBC: 3.13 MIL/uL — AB (ref 3.87–5.11)
RDW: 14.1 % (ref 11.5–15.5)
WBC: 5.4 10*3/uL (ref 4.0–10.5)

## 2017-11-05 LAB — BLOOD CULTURE ID PANEL (REFLEXED)
ACINETOBACTER BAUMANNII: NOT DETECTED
CANDIDA ALBICANS: NOT DETECTED
CANDIDA KRUSEI: NOT DETECTED
Candida glabrata: NOT DETECTED
Candida parapsilosis: NOT DETECTED
Candida tropicalis: NOT DETECTED
ENTEROBACTERIACEAE SPECIES: NOT DETECTED
ENTEROCOCCUS SPECIES: NOT DETECTED
ESCHERICHIA COLI: NOT DETECTED
Enterobacter cloacae complex: NOT DETECTED
HAEMOPHILUS INFLUENZAE: NOT DETECTED
Klebsiella oxytoca: NOT DETECTED
Klebsiella pneumoniae: NOT DETECTED
LISTERIA MONOCYTOGENES: NOT DETECTED
METHICILLIN RESISTANCE: NOT DETECTED
Neisseria meningitidis: NOT DETECTED
PSEUDOMONAS AERUGINOSA: NOT DETECTED
Proteus species: NOT DETECTED
SERRATIA MARCESCENS: NOT DETECTED
STAPHYLOCOCCUS AUREUS BCID: DETECTED — AB
STREPTOCOCCUS AGALACTIAE: NOT DETECTED
STREPTOCOCCUS PNEUMONIAE: NOT DETECTED
STREPTOCOCCUS PYOGENES: NOT DETECTED
Staphylococcus species: DETECTED — AB
Streptococcus species: NOT DETECTED

## 2017-11-05 LAB — GLUCOSE, CAPILLARY
Glucose-Capillary: 141 mg/dL — ABNORMAL HIGH (ref 65–99)
Glucose-Capillary: 171 mg/dL — ABNORMAL HIGH (ref 65–99)
Glucose-Capillary: 192 mg/dL — ABNORMAL HIGH (ref 65–99)
Glucose-Capillary: 269 mg/dL — ABNORMAL HIGH (ref 65–99)

## 2017-11-05 MED ORDER — CEFAZOLIN SODIUM-DEXTROSE 2-4 GM/100ML-% IV SOLN
2.0000 g | Freq: Three times a day (TID) | INTRAVENOUS | Status: AC
  Administered 2017-11-05 – 2017-11-10 (×16): 2 g via INTRAVENOUS
  Filled 2017-11-05 (×17): qty 100

## 2017-11-05 NOTE — Consult Note (Signed)
[name]    Date of Admission:  11/04/2017   Total days of antibiotics 1         Day 1 cefazolin               Reason for Consult: MSSA bacteremia     Referring Provider: CHAMP Primary Care Provider: Cruzita Lederer   Assessment: 82 y.o. female admitted with fevers and altered mental 4 days after surgery for skin cancer removal on her scalp. During work up found to have MSSA bacteremia in 2/2 cultures. She is already on cefazolin. Regarding her wound there is an open area of purulence near the posterior incision line that is tender with surrounding edema indicating cellulitis. There is a second area of concern at the anterior portion that I worry will also need drainage. She does have prosthetic aortic valve in place following TAVR that needs assessment to ensure no seeding has taken place on her valve. Would recommend TEE to evaluate.   Plan: 1. Continue Cefazolin 2. Repeat blood cultures tomorrow morning (ordered)  3. Surgery (?plastics) to evaluate remaining site for further drainage  4. Hold on PICC line  5. Needs TEE with prosthetic valve   Principal Problem:   MSSA bacteremia Active Problems:   Wound infection after surgery   S/P TAVR (transcatheter aortic valve replacement)   Diabetes mellitus type 2 in obese [facility])   Essential hypertension   Chronic renal insufficiency, stage III (moderate) (HCC)   Acute respiratory failure with hypoxia (HCC)   Chronic diastolic CHF (congestive heart failure) (HCC)   Anemia   . allopurinol  300 mg Oral Daily  . aspirin  81 mg Oral Daily  . clopidogrel  75 mg Oral Q breakfast  . colchicine  0.6 mg Oral Daily  . docusate sodium  100 mg Oral BID  . ferrous sulfate  325 mg Oral Q breakfast  . insulin aspart  0-15 Units Subcutaneous TID WC  . insulin aspart  0-5 Units Subcutaneous QHS  . metoprolol tartrate  12.5 mg Oral BID  . rosuvastatin  10 mg Oral Daily    HPI: Carrie Monroe is a 82 y.o.  female with fever and chills. Significant PMHx includes TAVR, HTN, hyperlipidemia, DM, CAD, diastolic HF, breast CA s/p lumpectomy and XRT. Presented 11/04/2017 from home with altered mental status. Of note she had skin surgery on Thursday of last week removing squamous cell carcinoma lesion - Saturday she started hallucinating after oxycodone and had trouble with bleeding. Went back to see her surgeon Saturday reporting it looked OK at that time and instructed on care of the site. Monday morning she was unable to stand up, confused and had a fever. In the emergency room she was found to be febrile to 101.8 deg, wound looked "definitely infected" - stitches were removed with moderate amount of purulent drainage expressed. WBC mildly elevated 10.8k. She was started on vancomycin/cefazolin in the ED after blood and urine cultures were obtained. Blood cultures now growing gram positive cocci in both sets with BCID indicating MSSA.   In discussion with Ms. Brossard she denies any other issues regarding chest pain, SOB, abdominal pain, joint pain or back pain. She does have a prosthetic stent valve following a TAVR. She falls asleep easily during our discussion today.    Review of Systems: Review of Systems  Constitutional: Positive for chills, fever and malaise/fatigue. Negative for weight loss.  HENT: Negative for sore throat.  No dental problems  Respiratory: Negative for cough and sputum production.   Cardiovascular: Negative for chest pain and leg swelling.  Gastrointestinal: Negative for abdominal pain, diarrhea and vomiting.  Genitourinary: Negative for dysuria and flank pain.  Musculoskeletal: Negative for joint pain, myalgias and neck pain.  Skin: Negative for rash.       Draining wound to scalp   Neurological: Negative for dizziness, tingling and headaches.       Confusion   Psychiatric/Behavioral: Negative for depression and substance abuse. The patient is not nervous/anxious and does  not have insomnia.     Past Medical History:  Diagnosis Date  . Aortic stenosis, severe    a. 01/2017: s/p TAVR; hospital course complicated by large groin hematoma/wound  . Arthritis   . Breast cancer ([place])    a. L breast cancer s/p lumpectomy and XRT  . Carotid artery stenosis    a. 11/2016: 80-99% RICA stenosis, 1-39% vs low range 36-64% LICA   . Chronic diastolic CHF (congestive heart failure) ([place])   . CKD (chronic kidney disease)   . Coronary artery disease    a. 11/2016: diagnosed with multivessel CAD, turned down for CABG and underwent PCI/DES to mLCx, PCI/DES to mLAD and PCTA of ostial diagonal on 12/28/16  . Diabetes mellitus    a. diet controlled   . Exogenous obesity   . Gout   . Hemorrhoids   . History of kidney stones   . Hyperlipidemia   . Hypertension   . 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   26 mm Medtronic CorValve Evolut Pro transcatheter heart valve placed via percutaneous left transfemoral approach     Social History   Tobacco Use  . Smoking status: Never Smoker  . Smokeless tobacco: Never Used  Substance Use Topics  . Alcohol use: No  . Drug use: No    Family History  Problem Relation Age of Onset  . Cancer Mother        pancreatic  . Heart disease Father   . Hypertension Father   . Cancer Brother   . Diabetes Sister    Allergies  Allergen Reactions  . Oxycodone Other (See Comments)    Hallunication    OBJECTIVE: Blood pressure (!) 160/69, pulse 72, temperature 99.4 F (37.4 C), temperature source Oral, resp. rate 20, height 5\' 3"  (1.6 m), weight 244 lb 11.4 oz (111 kg), SpO2 98 %.  Physical Exam  Constitutional: She is oriented to person, place, and time. She appears well-developed and well-nourished. No distress.  Fatigued appearing. Falls asleep easily in bed.   HENT:  Mouth/Throat: Oropharynx is clear and moist. No oral lesions. No dental abscesses.  Longitudinal incision line noted on the top of head with  erythema, tenderness and warmth with purulent drainage noted to posterior incision. Anterior incision with ~1.5-2cm lump with mild tenderness that is mostly firm.   Eyes: Pupils are equal, round, and reactive to light.  Cardiovascular: Normal rate and regular rhythm.  Difficult to appreciate murmur as she is snoring throughout exam  Pulmonary/Chest: Effort normal and breath sounds normal. No respiratory distress.  Abdominal: Soft. She exhibits no distension. There is no tenderness.  Musculoskeletal: Normal range of motion. She exhibits no tenderness.  Lymphadenopathy:    She has no cervical adenopathy.  Neurological: She is alert and oriented to person, place, and time.  Skin: Skin is warm and dry. No rash noted.  Psychiatric: Judgment normal.  Nursing note and vitals reviewed.   Lab  Results Lab Results  Component Value Date   WBC 5.4 11/05/2017   HGB 9.9 (L) 11/05/2017   HCT 30.9 (L) 11/05/2017   MCV 98.7 11/05/2017   PLT 82 (L) 11/05/2017    Lab Results  Component Value Date   CREATININE 1.34 (H) 11/05/2017   BUN 28 (H) 11/05/2017   NA 134 (L) 11/05/2017   K 4.2 11/05/2017   CL 106 11/05/2017   CO2 21 (L) 11/05/2017    Lab Results  Component Value Date   ALT 18 11/04/2017   AST 22 11/04/2017   ALKPHOS 105 11/04/2017   BILITOT 1.1 11/04/2017     Microbiology: BCx 11/04/17 >> 2/2 GPCs with MSSA on BCID       [facility] Staphylococcus aureus bacteremia   Staphylococcus aureus bacteremia (SAB) is associated with a high rate of complications and mortality.  Specific aspects of clinical management are critical to optimizing the outcome of patients with SAB.  Therefore, the [facility] Antimicrobial Management Team [facility]) has initiated an intervention aimed at improving the management of SAB at [facility].  To do so, Infectious Diseases physicians are providing an evidence-based consult for the management of all patients with SAB.     Yes No  Comments  Perform follow-up blood cultures (even if the patient is afebrile) to ensure clearance of bacteremia []  []  Ordered 11/06/17  Remove vascular catheter and obtain follow-up blood cultures after the removal of the catheter []  []  N/A  Perform echocardiography to evaluate for endocarditis (transthoracic ECHO is 40-50% sensitive, TEE is > 90% sensitive) []  []  Prosthetic AV - needs TEE   Please keep in mind, that neither test can definitively EXCLUDE endocarditis, and that should clinical suspicion remain high for endocarditis the patient should then still be treated with an "endocarditis" duration of therapy = 6 weeks  Consult electrophysiologist to evaluate implanted cardiac device (pacemaker, ICD) []  [x]  N/a   Ensure source control []  []  Recommended surgery to evaluate incision for further debridement of anterior portion.   Investigate for "metastatic" sites of infection [x]  []  ROS negative otherwise  Does the patient have ANY symptom or physical exam finding that would suggest a deeper infection (back or neck pain that may be suggestive of vertebral osteomyelitis or epidural abscess, muscle pain that could be a symptom of pyomyositis)?   Change antibiotic therapy to cefazolin  [x]  []  Beta-lactam antibiotics are preferred for MSSA due to higher cure rates.   If on Vancomycin, goal trough should be 15 - 20 mcg/mL  Estimated duration of IV antibiotic therapy:   []  []  Consult case management for probably prolonged outpatient IV antibiotic therapy    Janene Madeira, MSN, NP-C [facility] [place] Cell: [phone] Pager: [phone]  11/05/2017 10:57 AM

## 2017-11-05 NOTE — Consult Note (Signed)
Carrie [place] 07/19/1935  [id number].    Requesting MD: Dr. 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  She underwent resection last Thursday by a dermatologist in his office.  She was having some bloody drainage and saw him again on Saturday.  He felt this was not infected.  She presented to the ED yesterday secondary to fevers and some AMS.  She was found to have cellulitis around her scalp incision as well as MSSA bacteremia.  She is on cefazolin currently for this bacteremia.  We have been asked to see her for her wound infection.  ROS: ROS : Please see HPI, otherwise currently negative  Family History  Problem Relation Age of Onset  . Cancer Mother        pancreatic  . Heart disease Father   . Hypertension Father   . Cancer Brother   . Diabetes Sister     Past Medical History:  Diagnosis Date  . Aortic stenosis, severe    a. 01/2017: s/p TAVR; hospital course complicated by large groin hematoma/wound  . Arthritis   . Breast cancer ([place])    a. L breast cancer s/p lumpectomy and XRT  . Carotid artery stenosis    a. 11/2016: 80-99% RICA stenosis, 1-39% vs low range 26-71% LICA   . Chronic diastolic CHF (congestive heart failure) ([place])   . CKD (chronic kidney disease)   . Coronary artery disease    a. 11/2016: diagnosed with multivessel CAD, turned down for CABG and underwent PCI/DES to mLCx, PCI/DES to mLAD and PCTA of ostial diagonal on 12/28/16  . Diabetes mellitus    a. diet controlled   . Exogenous obesity   . Gout   . Hemorrhoids   . History of kidney stones   . Hyperlipidemia   . Hypertension   . 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 APPLICATION OF WOUND VAC Left 02/05/2017   Procedure: APPLICATION OF WOUND VAC;  Surgeon: Serafina Mitchell, MD;  Location: [facility];  Service: Vascular;  Laterality: Left;  . APPLICATION OF WOUND VAC Left 02/22/2017   Procedure: APPLICATION OF WOUND VAC LEFT GROIN;  Surgeon: Serafina Mitchell, MD;  Location: [facility];  Service: Vascular;  Laterality: Left;  . APPLICATION OF WOUND VAC Left 04/04/2017   Procedure: APPLICATION OF WOUND VAC;  Surgeon: Serafina Mitchell, MD;  Location: [facility];  Service: Vascular;  Laterality: Left;  . BREAST LUMPECTOMY  2009   left  . BREAST LUMPECTOMY WITH NEEDLE LOCALIZATION Left 01/06/2015   Procedure: BREAST BIOPSY WITH NEEDLE LOCALIZATION AND SKIN BIOPSY;  Surgeon: Autumn Messing III, MD;  Location: [facility];  Service: General;  Laterality: Left;  . CATARACT EXTRACTION    . CHOLECYSTECTOMY  2010  . CORONARY STENT INTERVENTION N/A 12/28/2016   Procedure: Coronary Stent Intervention;  Surgeon: Burnell Blanks, MD;  Location: [facility];  Service: Cardiovascular;  Laterality: N/A;  . EYE SURGERY     BILATERAL CATARACT EXTRACTIONS AND LENS IMPLANTS  . FEMORAL ARTERY EXPLORATION N/A 02/05/2017   Procedure: Evacuation of Retroperitoneal Hematoma and Primary Repair of Femoral Artery and  FEMORAL ARTERY EXPLORATION;  Surgeon: Serafina Mitchell, MD;  Location: [place];  Service: Vascular;  Laterality: N/A;  . HEMATOMA EVACUATION Left 02/08/2017   Procedure: EVACUATION HEMATOMA;  Surgeon: Rosetta Posner, MD;  Location: [facility];  Service: Vascular;  Laterality: Left;  . I&D EXTREMITY Left 02/22/2017   Procedure: [place];  Surgeon: Serafina Mitchell, MD;  Location: [facility];  Service: Vascular;  Laterality: Left;  . I&D EXTREMITY Left 04/04/2017   Procedure: [place];  Surgeon: Serafina Mitchell, MD;  Location: [facility];  Service: Vascular;  Laterality: Left;  . KNEE SURGERY     right  PARTIAL KNEE REPLACEMENT  . LEFT HEART CATH AND CORONARY ANGIOGRAPHY N/A 12/17/2016   Procedure: Left Heart Cath and Coronary Angiography;  Surgeon: Burnell Blanks, MD;  Location: [facility];  Service: Cardiovascular;  Laterality: N/A;  . MULTIPLE EXTRACTIONS WITH ALVEOLOPLASTY N/A 12/21/2016   Procedure: Extraction of tooth #'s 12, 23,24,25,26 and 29 with alveoloplasty and gross debridement of remaining teeth.;  Surgeon: Lenn Cal, DDS;  Location: [facility];  Service: Oral Surgery;  Laterality: N/A;  . SHOULDER SURGERY     right  . SKIN GRAFT TO RIGHT HAND     AFTER BURN TO THE HAND  . TEE WITHOUT CARDIOVERSION N/A 02/05/2017   Procedure: TRANSESOPHAGEAL ECHOCARDIOGRAM (TEE);  Surgeon: Burnell Blanks, MD;  Location: [facility];  Service: Open Heart Surgery;  Laterality: N/A;  . TOTAL KNEE ARTHROPLASTY Left 08/03/2013   Procedure: TOTAL LEFT KNEE ARTHROPLASTY;  Surgeon: Gearlean Alf, MD;  Location[facility];  Service: Orthopedics;  Laterality: Left;  . TRANSCATHETER AORTIC VALVE REPLACEMENT, TRANSFEMORAL N/A 02/05/2017   Procedure: TRANSCATHETER AORTIC VALVE REPLACEMENT, TRANSFEMORAL;  Surgeon: Burnell Blanks, MD;  Location: [facility];  Service: Open Heart Surgery;  Laterality: N/A;  . US ECHOCARDIOGRAPHY  05/15/2010   EF 60-65%    Social History:  reports that she has never smoked. She has never used smokeless tobacco. She reports that she does not drink alcohol or use drugs.  Allergies:  Allergies  Allergen Reactions  . Oxycodone Other (See Comments)    Hallunication    Medications Prior to Admission  Medication Sig Dispense Refill  . allopurinol (ZYLOPRIM) 300 MG tablet Take 300 mg by mouth daily.     Marland Kitchen aspirin 81 MG chewable tablet Chew 1 tablet (81 mg total) by mouth daily.    . Cholecalciferol (VITAMIN D PO) Take 1 capsule by mouth daily.    . clopidogrel (PLAVIX) 75 MG tablet Take 1 tablet (75 mg total) by mouth daily with breakfast. 30 tablet 6  .  COLCRYS 0.6 MG tablet Take 0.6 mg by mouth daily.   0  . COLLAGEN PO Take by mouth daily. power     . ferrous sulfate (FERROUSUL) 325 (65 FE) MG tablet Take 1 tablet (325 mg total) 3 (three) times daily with meals by mouth. (Patient taking differently: Take 325 mg by mouth daily with breakfast. ) 90 tablet 0  . furosemide (LASIX) 40 MG tablet Take 1 tablet (40 mg total) by mouth daily as needed for fluid or edema. 90 tablet 1  . hydrALAZINE (APRESOLINE) 50 MG tablet Take 50 mg by mouth every 8 (eight) hours.     . metoprolol tartrate (LOPRESSOR) 25 MG tablet Take 0.5 tablets (12.5 mg total) by mouth 2 (two) times daily. 90 tablet 3  . rosuvastatin (CRESTOR) 10 MG tablet Take  10 mg by mouth daily.   0  . traMADol (ULTRAM) 50 MG tablet Take 50 mg by mouth every 6 (six) hours as needed.  0     Physical Exam: Blood pressure (!) 160/69, pulse 72, temperature 99.4 F (37.4 C), temperature source Oral, resp. rate 20, height 5' 3"  (1.6 m), weight 111 kg (244 lb 11.4 oz), SpO2 98 %. General: pleasant, elderly white female who is laying in bed in NAD HEENT: scalp with approximate 7cm incision with surrounding erythema and some minimal fluctuance at the ends.  It was cleaned with chloraprep and scissors and a Q tip were used to open her wound.  Cultures were taken.  Once the wound was open and cleaned, it was packed with saline moistened gauze.  Sclera are noninjected.  PERRL.  Ears and nose without any masses or lesions.  Mouth is pink and moist Heart: regular, rate, and rhythm.  Normal s1,s2. No obvious murmurs, gallops, or rubs noted.  Palpable radial and pedal pulses bilaterally Lungs: CTAB, no wheezes, rhonchi, or rales noted.  Respiratory effort nonlabored Abd: soft, NT, ND, +BS, no masses, hernias, or organomegaly Psych: A&Ox3 with an appropriate affect.   Results for orders placed or performed during the hospital encounter of 11/04/17 (from the past 48 hour(s))  Comprehensive metabolic panel      Status: Abnormal   Collection Time: 11/04/17 11:05 AM  Result Value Ref Range   Sodium 137 135 - 145 mmol/L   Potassium 3.8 3.5 - 5.1 mmol/L   Chloride 107 101 - 111 mmol/L   CO2 20 (L) 22 - 32 mmol/L   Glucose, Bld 207 (H) 65 - 99 mg/dL   BUN 28 (H) 6 - 20 mg/dL   Creatinine, Ser 1.36 (H) 0.44 - 1.00 mg/dL   Calcium 8.9 8.9 - 10.3 mg/dL   Total Protein 6.1 (L) 6.5 - 8.1 g/dL   Albumin 3.0 (L) 3.5 - 5.0 g/dL   AST 22 15 - 41 U/L   ALT 18 14 - 54 U/L   Alkaline Phosphatase 105 38 - 126 U/L   Total Bilirubin 1.1 0.3 - 1.2 mg/dL   GFR calc non Af Amer 36 (L) >60 mL/min   GFR calc Af Amer 42 (L) >60 mL/min    Comment: (NOTE) The eGFR has been calculated using the CKD EPI equation. This calculation has not been validated in all clinical situations. eGFR's persistently <60 mL/min signify possible Chronic Kidney Disease.    Anion gap 10 5 - 15    Comment: Performed at [facility] [name], [place], [place] [address]  CBC with Differential     Status: Abnormal   Collection Time: 11/04/17 11:05 AM  Result Value Ref Range   WBC 10.8 (H) 4.0 - 10.5 K/uL   RBC 3.29 (L) 3.87 - 5.11 MIL/uL   Hemoglobin 10.5 (L) 12.0 - 15.0 g/dL   HCT 32.3 (L) 36.0 - 46.0 %   MCV 98.2 78.0 - 100.0 fL   MCH 31.9 26.0 - 34.0 pg   MCHC 32.5 30.0 - 36.0 g/dL   RDW 14.0 11.5 - 15.5 %   Platelets 93 (L) 150 - 400 K/uL    Comment: REPEATED TO VERIFY PLATELET COUNT CONFIRMED BY SMEAR    Neutrophils Relative % 87 %   Neutro Abs 9.4 (H) 1.7 - 7.7 K/uL   Lymphocytes Relative 3 %   Lymphs Abs 0.3 (L) 0.7 - 4.0 K/uL   Monocytes Relative 8 %  Monocytes Absolute 0.9 0.1 - 1.0 K/uL   Eosinophils Relative 0 %   Eosinophils Absolute 0.0 0.0 - 0.7 K/uL   Basophils Relative 0 %   Basophils Absolute 0.0 0.0 - 0.1 K/uL   Immature Granulocytes 2 %   Abs Immature Granulocytes 0.2 (H) 0.0 - 0.1 K/uL    Comment: Performed at [facility] [address], [place], [place] [address]  Protime-INR      Status: None   Collection Time: 11/04/17 11:05 AM  Result Value Ref Range   Prothrombin Time 15.1 11.4 - 15.2 seconds   INR 1.20     Comment: Performed at [facility] [address], [place], [place] [address]  Urinalysis, Routine w reflex microscopic     Status: Abnormal   Collection Time: 11/04/17 11:05 AM  Result Value Ref Range   Color, Urine YELLOW YELLOW   APPearance HAZY (A) CLEAR   Specific Gravity, Urine 1.018 1.005 - 1.030   pH 5.0 5.0 - 8.0   Glucose, UA NEGATIVE NEGATIVE mg/dL   Hgb urine dipstick MODERATE (A) NEGATIVE   Bilirubin Urine NEGATIVE NEGATIVE   Ketones, ur 20 (A) NEGATIVE mg/dL   Protein, ur >=300 (A) NEGATIVE mg/dL   Nitrite NEGATIVE NEGATIVE   Leukocytes, UA TRACE (A) NEGATIVE   RBC / HPF 11-20 0 - 5 RBC/hpf   WBC, UA 21-50 0 - 5 WBC/hpf   Bacteria, UA RARE (A) NONE SEEN   Squamous Epithelial / LPF 0-5 0 - 5   Mucus PRESENT     Comment: Performed at [facility], [place] [address] [place], [place] [address]  Brain natriuretic peptide     Status: Abnormal   Collection Time: 11/04/17 11:05 AM  Result Value Ref Range   B Natriuretic Peptide 384.0 (H) 0.0 - 100.0 pg/mL    Comment: Performed at [facility] [address], [place], [place] [address]  I-Stat CG4 Lactic Acid, ED     Status: None   Collection Time: 11/04/17 11:06 AM  Result Value Ref Range   Lactic Acid, Venous 1.01 0.5 - 1.9 mmol/L  Culture, blood (Routine x 2)     Status: Abnormal (Preliminary result)   Collection Time: 11/04/17 11:06 AM  Result Value Ref Range   Specimen Description BLOOD BLOOD RIGHT FOREARM    Special Requests      BOTTLES DRAWN AEROBIC AND ANAEROBIC Blood Culture adequate volume   Culture  Setup Time      GRAM POSITIVE COCCI IN BOTH AEROBIC AND ANAEROBIC BOTTLES CRITICAL RESULT CALLED TO, READ BACK BY AND VERIFIED WITH: G ABBOTT PHARMD 11/05/17 0026 JDW    Culture (A)     STAPHYLOCOCCUS AUREUS CULTURE REINCUBATED FOR BETTER GROWTH Performed at  [facility], [place] [address] [place], [place] [address]    Report Status PENDING   Blood Culture ID Panel (Reflexed)     Status: Abnormal   Collection Time: 11/04/17 11:06 AM  Result Value Ref Range   Enterococcus species NOT DETECTED NOT DETECTED   Listeria monocytogenes NOT DETECTED NOT DETECTED   Staphylococcus species DETECTED (A) NOT DETECTED    Comment: CRITICAL RESULT CALLED TO, READ BACK BY AND VERIFIED WITH: G ABBOTT PHARMD 11/05/17 0026 JDW    Staphylococcus aureus DETECTED (A) NOT DETECTED    Comment: Methicillin (oxacillin) susceptible Staphylococcus aureus (MSSA). Preferred therapy is anti staphylococcal beta lactam antibiotic (Cefazolin or Nafcillin), unless clinically contraindicated. CRITICAL RESULT CALLED TO, READ BACK  BY AND VERIFIED WITH: G ABBOTT PHARMD 11/05/17 0026 JDW    Methicillin resistance NOT DETECTED NOT DETECTED   Streptococcus species NOT DETECTED NOT DETECTED   Streptococcus agalactiae NOT DETECTED NOT DETECTED   Streptococcus pneumoniae NOT DETECTED NOT DETECTED   Streptococcus pyogenes NOT DETECTED NOT DETECTED   Acinetobacter baumannii NOT DETECTED NOT DETECTED   Enterobacteriaceae species NOT DETECTED NOT DETECTED   Enterobacter cloacae complex NOT DETECTED NOT DETECTED   Escherichia coli NOT DETECTED NOT DETECTED   Klebsiella oxytoca NOT DETECTED NOT DETECTED   Klebsiella pneumoniae NOT DETECTED NOT DETECTED   Proteus species NOT DETECTED NOT DETECTED   Serratia marcescens NOT DETECTED NOT DETECTED   Haemophilus influenzae NOT DETECTED NOT DETECTED   Neisseria meningitidis NOT DETECTED NOT DETECTED   Pseudomonas aeruginosa NOT DETECTED NOT DETECTED   Candida albicans NOT DETECTED NOT DETECTED   Candida glabrata NOT DETECTED NOT DETECTED   Candida krusei NOT DETECTED NOT DETECTED   Candida parapsilosis NOT DETECTED NOT DETECTED   Candida tropicalis NOT DETECTED NOT DETECTED    Comment: Performed at [facility], [place] [address] [place], [place] [address]  Culture, blood (Routine x 2)     Status: None (Preliminary result)   Collection Time: 11/04/17 11:08 AM  Result Value Ref Range   Specimen Description BLOOD RIGHT ANTECUBITAL    Special Requests      BOTTLES DRAWN AEROBIC AND ANAEROBIC Blood Culture adequate volume   Culture  Setup Time      IN BOTH AEROBIC AND ANAEROBIC BOTTLES GRAM POSITIVE COCCI CRITICAL RESULT CALLED TO, READ BACK BY AND VERIFIED WITH: G ABBOTT PHARMD 11/05/17 0026 JDW    Culture      GRAM POSITIVE COCCI TOO YOUNG TO READ Performed at [facility], [place] [address] [place], [place] [address]    Report Status PENDING   Urine culture     Status: Abnormal (Preliminary result)   Collection Time: 11/04/17 11:09 AM  Result Value Ref Range   Specimen Description URINE, CATHETERIZED    Special Requests      NONE Performed at [facility] [address], [place], [place] [address]    Culture 10,000 COLONIES/mL PSEUDOMONAS AERUGINOSA (A)    Report Status PENDING   I-Stat CG4 Lactic Acid, ED     Status: None   Collection Time: 11/04/17  2:30 PM  Result Value Ref Range   Lactic Acid, Venous 0.93 0.5 - 1.9 mmol/L  Glucose, capillary     Status: Abnormal   Collection Time: 11/04/17  9:55 PM  Result Value Ref Range   Glucose-Capillary 146 (H) 65 - 99 mg/dL   Comment 1 Notify RN   Basic metabolic panel     Status: Abnormal   Collection Time: 11/05/17  5:34 AM  Result Value Ref Range   Sodium 134 (L) 135 - 145 mmol/L   Potassium 4.2 3.5 - 5.1 mmol/L   Chloride 106 101 - 111 mmol/L   CO2 21 (L) 22 - 32 mmol/L   Glucose, Bld 155 (H) 65 - 99 mg/dL   BUN 28 (H) 6 - 20 mg/dL   Creatinine, Ser 1.34 (H) 0.44 - 1.00 mg/dL   Calcium 8.5 (L) 8.9 - 10.3 mg/dL   GFR calc non Af Amer 37 (L) >60 mL/min   GFR calc Af Amer 42 (L) >60 mL/min    Comment: (NOTE) The eGFR has been calculated using the CKD EPI equation. This calculation has not been  validated in all clinical situations. eGFR's  persistently <60 mL/min signify possible Chronic Kidney Disease.    Anion gap 7 5 - 15    Comment: Performed at [facility] [address] [place], [place] [address]  CBC     Status: Abnormal   Collection Time: 11/05/17  5:34 AM  Result Value Ref Range   WBC 5.4 4.0 - 10.5 K/uL   RBC 3.13 (L) 3.87 - 5.11 MIL/uL   Hemoglobin 9.9 (L) 12.0 - 15.0 g/dL   HCT 30.9 (L) 36.0 - 46.0 %   MCV 98.7 78.0 - 100.0 fL   MCH 31.6 26.0 - 34.0 pg   MCHC 32.0 30.0 - 36.0 g/dL   RDW 14.1 11.5 - 15.5 %   Platelets 82 (L) 150 - 400 K/uL    Comment: CONSISTENT WITH PREVIOUS RESULT Performed at [facility], [place] [address] [place], [place] [address]   Glucose, capillary     Status: Abnormal   Collection Time: 11/05/17  7:31 AM  Result Value Ref Range   Glucose-Capillary 141 (H) 65 - 99 mg/dL  Glucose, capillary     Status: Abnormal   Collection Time: 11/05/17 12:05 PM  Result Value Ref Range   Glucose-Capillary 171 (H) 65 - 99 mg/dL   Dg Chest 2 View  Result Date: 11/04/2017 CLINICAL DATA:  Altered mental status. EXAM: CHEST - 2 VIEW COMPARISON:  02/07/2017 FINDINGS: There is no focal parenchymal opacity. There is no pleural effusion or pneumothorax. There is stable cardiomegaly. There is thoracic aortic atherosclerosis. Mild osteoarthritis of the right glenohumeral joint. IMPRESSION: No acute cardiopulmonary disease. Electronically Signed   By: Kathreen Devoid   On: 11/04/2017 11:40      Assessment/Plan MSSA bacteremia secondary to scalp wound infection, s/p excision of squamous cell carcinoma by dermatology as outpatient -wound opened and culture obtained from wound -NS WD dressing changes BID to start tomorrow -will need to follow up with her dermatologist as an outpatient for further wound care -abx therapy per primary service   Henreitta Cea, [facility] 11/05/2017, 1:35 PM Pager: [phone]

## 2017-11-05 NOTE — Progress Notes (Signed)
PHARMACY - PHYSICIAN COMMUNICATION CRITICAL VALUE ALERT - BLOOD CULTURE IDENTIFICATION (BCID)  Carrie [place] is an 82 y.o. female who presented to [facility] on 11/04/2017 with a chief complaint of scalp wound infection   Assessment:  4/4 blood cultures growing MSSA  Name of physician (or Provider) Contacted:  Tylene Fantasia  Current antibiotics: Vancomycin   Changes to prescribed antibiotics recommended:   Change to Ancef 2 g IV  q8h  Results for orders placed or performed during the hospital encounter of 11/04/17  Blood Culture ID Panel (Reflexed) (Collected: 11/04/2017 11:06 AM)  Result Value Ref Range   Enterococcus species NOT DETECTED NOT DETECTED   Listeria monocytogenes NOT DETECTED NOT DETECTED   Staphylococcus species DETECTED (A) NOT DETECTED   Staphylococcus aureus DETECTED (A) NOT DETECTED   Methicillin resistance NOT DETECTED NOT DETECTED   Streptococcus species NOT DETECTED NOT DETECTED   Streptococcus agalactiae NOT DETECTED NOT DETECTED   Streptococcus pneumoniae NOT DETECTED NOT DETECTED   Streptococcus pyogenes NOT DETECTED NOT DETECTED   Acinetobacter baumannii NOT DETECTED NOT DETECTED   Enterobacteriaceae species NOT DETECTED NOT DETECTED   Enterobacter cloacae complex NOT DETECTED NOT DETECTED   Escherichia coli NOT DETECTED NOT DETECTED   Klebsiella oxytoca NOT DETECTED NOT DETECTED   Klebsiella pneumoniae NOT DETECTED NOT DETECTED   Proteus species NOT DETECTED NOT DETECTED   Serratia marcescens NOT DETECTED NOT DETECTED   Haemophilus influenzae NOT DETECTED NOT DETECTED   Neisseria meningitidis NOT DETECTED NOT DETECTED   Pseudomonas aeruginosa NOT DETECTED NOT DETECTED   Candida albicans NOT DETECTED NOT DETECTED   Candida glabrata NOT DETECTED NOT DETECTED   Candida krusei NOT DETECTED NOT DETECTED   Candida parapsilosis NOT DETECTED NOT DETECTED   Candida tropicalis NOT DETECTED NOT DETECTED    [name] 11/05/2017  1:44 AM

## 2017-11-05 NOTE — Progress Notes (Signed)
PROGRESS NOTE  Carrie OFFNER Monroe:[id number] DOB: 07-23-1935 DOA: 11/04/2017 PCP: Marton Redwood, MD   LOS: 1 day   Attending MD note  Patient was seen, examined,treatment plan was discussed with the PA-S.  I have personally reviewed the clinical findings, lab, imaging studies and management of this patient in detail. 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   BP (!) 113/47   Pulse 75   Temp 98.4 F (36.9 C) (Oral)   Resp 18   Ht _0  (1.6 m)   Wt 111 kg (244 lb 11.4 oz)   SpO2 94%   BMI 43.35 kg/m  On Exam: Gen. exam: Awake, alert, not in any distress Chest: Good air entry bilaterally, no rhonchi or rales CVS: S1-S2 regular, no murmurs Abdomen: Soft, nontender and nondistended Neurology: Non-focal Skin: No rash or lesions  Plan  Severe sepsis due to MSSA bacteremia in the setting of infected scalp incision -Met SIRS criteria, plus a source, as well as showed signs of endorgan perfusion with encephalopathy -Sepsis physiology resolved, discontinue IV fluids -Placed on cefazolin, continue -Infectious disease consulted, appreciate input, discussed with Dr. Linus Salmons today.  Obtain a TTE, if negative will need a TEE -Consulted general surgery today, there appears to be an area of fluctuance, appreciate input.  Looks like they drained and sent cultures  Aortic stenosis status post TAVR -Stable  Chronic diastolic CHF -Discontinue IV fluids today, currently appears  euvolemic  Chronic kidney disease stage III -Creatinine appears at baseline  Thrombocytopenia -Recent prior values were within normal limits however she did have thrombocytopenia in the past so suspect a chronic component potentially worsened by her sepsis -No bleeding, continue to monitor  Coronary artery disease -No chest pain  Diet-controlled type 2 diabetes mellitus  -Continue sliding scale  Hypertension -Continue Lopressor pressure stable today  Iron deficiency anemia/in the setting of chronic kidney disease -Continue iron supplementations, hemoglobin stable   Rest as below  Costin M. Cruzita Lederer, MD [facility] [phone]  If 7PM-7AM, please contact night-coverage [email or web address] Password [email or web address]    Brief Narrative / Interim history: 82 year old female with medical history significant for squamous cell cancer on the scalp, breast cancer s/p lumpectomy and XRT, aortic stenosis s/p TAVR 01/2017, T2DM, HTN, CKD stage 3, chronic diastolic CHF, anemia. She presented to the ED yesterday for fevers and altered mental status s/p squamous cell cancer removal from her scalp 5 days ago. Admitted to the hospital with MSSA bacteremia secondary to infected scalp wound.  Assessment & Plan: Principal Problem:   MSSA bacteremia Active Problems:   Diabetes mellitus type 2 in obese [facility])   Essential hypertension   Chronic renal insufficiency, stage III (moderate) (HCC)   S/P TAVR (transcatheter aortic valve replacement)   Acute respiratory failure with hypoxia (HCC)   Chronic diastolic CHF (congestive heart failure) (HCC)   Anemia   Wound infection after surgery  1. MSSA bacteremia -Patient is afebrile with no leukocytosis, appears to be fatigued, mental status has improved. -Blood culture grew staph aureus and gram  positive cocci, but too early to read. Repeat blood cultures tomorrow per ID. -Continue Cefazolin per ID. -2D echo to evaluated prosthetic valve for any signs of  infection.  2. Infected scalp wound -Surgery to consult for possible I&D    3. S/P TAVR -On ASA and Plavix.  -2D echo to evaluated for endocarditis in the setting of MSSA bacteremia.   4. DM -Controlled by diet. -Blood glucose 171 today, likely due to body's response to infection. -Insulin sliding scale 3 times daily with meals.  5. HTN -160/69 this morning. -Hydralazine and lopressor were held due to borderline low BP on admission last night. -Restart PO Lopressor 12.5 mg BID.  6. CKD stage 3 -Stable, BUN 28, Cr 1.34, GFR 37, all of which are around the patients baseline. -Continue to monitor through daily CMP.  7. Chronic diastolic CHF -Last EF 30-16% 03/06/17 -BNP 384 -Daily weights, strict I/Os  8. Anemia, possibly iron deficiency also in the setting of chronic kidney disease -Stable, hemoglobin 9.9 today. Her normal seems to run between 9-10. Continue to monitor with CBC. -Continue iron supplementation  9. CAD -Patient had 3 stents placed in August 2018, currently on aspirin and Plavix.  No chest pain   DVT prophylaxis: ASA, plavix, SCDs Code Status: DNR Family Communication: no family at bedside Disposition Plan: TBD, home when ready   Consultants:   ID  Surgery  Procedures:   2D echo: Ordered 11/05/17  Antimicrobials:  IV Cefazolin 2 g q8hrs  Subjective: Patient reports she feels better this morning. She has some pain along the incision line on her scalp but that has improved somewhat since yesterday. She still feels fatigued. She remains on [place] 2 L with O2 sats at 98%. She denies fever, chills, chest pain, SOB, nausea.   Objective: Vitals:   11/04/17 2018 11/04/17 2042 11/04/17 2159 11/05/17 0545  BP: (!) 88/37 (!) 83/42 100/60 (!) 160/69  Pulse: 80   72  Resp: 18   20  Temp: (!) 100.7 F (38.2 C)   99.4 F (37.4 C)  TempSrc: Oral   Oral  SpO2: 97%   98%  Weight:      Height:        Intake/Output Summary (Last 24 hours) at 11/05/2017  1345 Last data filed at 11/04/2017 2306 Gross per 24 hour  Intake 652.5 ml  Output -  Net 652.5 ml   Filed Weights   11/04/17 1048 11/04/17 1736  Weight: 104.3 kg (230 lb) 111 kg (244 lb 11.4 oz)    Examination:  Constitutional: NAD Eyes: EOMI, conjunctivae normal, sclera nonicteric Neck: normal, supple, no visible masses Respiratory: clear to auscultation bilaterally, no wheezing, no crackles. Normal respiratory effort. No accessory muscle use.  Cardiovascular: Regular rate and rhythm, grade 2/6 systolic murmur noted. No LE edema.  Abdomen: Obese abdomen, no tenderness.  Skin: 6-7 cm incision with surrounding erythema located on the top of her scalp longitudinally, draining bloody fluid with dried blood crusted along her hair line. 2 cm area of induration and fluctuance noted on the superior aspect of the incision. Neurologic: grossly non focal   Data Reviewed: I have independently reviewed following labs and imaging studies   CBC: Recent Labs  Lab 11/04/17 1105 11/05/17 0534  WBC 10.8* 5.4  NEUTROABS 9.4*  --   HGB 10.5* 9.9*  HCT 32.3* 30.9*  MCV 98.2 98.7  PLT 93* 82*   Basic Metabolic Panel: Recent Labs  Lab 11/04/17 1105 11/05/17 0534  NA 137 134*  K 3.8 4.2  CL 107 106  CO2 20* 21*  GLUCOSE 207* 155*  BUN 28* 28*  CREATININE 1.36* 1.34*  CALCIUM 8.9 8.5*   GFR: Estimated Creatinine Clearance: 40.7 mL/min (A) (by C-G formula based on SCr of 1.34 mg/dL (H)). Liver Function Tests: Recent Labs  Lab 11/04/17 1105  AST 22  ALT 18  ALKPHOS 105  BILITOT 1.1  PROT 6.1*  ALBUMIN 3.0*   No results for input(s): LIPASE, AMYLASE in the last 168 hours. No results for input(s): AMMONIA in the last 168 hours. Coagulation Profile: Recent Labs  Lab 11/04/17 1105  INR 1.20   Cardiac Enzymes: No results for input(s): CKTOTAL, CKMB, CKMBINDEX, TROPONINI in the last 168 hours. BNP (last 3 results) No results for input(s): PROBNP in the last 8760  hours. HbA1C: No results for input(s): HGBA1C in the last 72 hours. CBG: Recent Labs  Lab 11/04/17 2155 11/05/17 0731 11/05/17 1205  GLUCAP 146* 141* 171*   Lipid Profile: No results for input(s): CHOL, HDL, LDLCALC, TRIG, CHOLHDL, LDLDIRECT in the last 72 hours. Thyroid Function Tests: No results for input(s): TSH, T4TOTAL, FREET4, T3FREE, THYROIDAB in the last 72 hours. Anemia Panel: No results for input(s): VITAMINB12, FOLATE, FERRITIN, TIBC, IRON, RETICCTPCT in the last 72 hours. Urine analysis:    Component Value Date/Time   COLORURINE YELLOW 11/04/2017 1105   APPEARANCEUR HAZY (A) 11/04/2017 1105   LABSPEC 1.018 11/04/2017 1105   PHURINE 5.0 11/04/2017 1105   GLUCOSEU NEGATIVE 11/04/2017 1105   HGBUR MODERATE (A) 11/04/2017 1105   BILIRUBINUR NEGATIVE 11/04/2017 1105   KETONESUR 20 (A) 11/04/2017 1105   PROTEINUR >=300 (A) 11/04/2017 1105   UROBILINOGEN 1.0 07/28/2013 1305   NITRITE NEGATIVE 11/04/2017 1105   LEUKOCYTESUR TRACE (A) 11/04/2017 1105   Sepsis Labs: Invalid input(s): PROCALCITONIN, LACTICIDVEN  Recent Results (from the past 240 hour(s))  Culture, blood (Routine x 2)     Status: Abnormal (Preliminary result)   Collection Time: 11/04/17 11:06 AM  Result Value Ref Range Status   Specimen Description BLOOD BLOOD RIGHT FOREARM  Final   Special Requests   Final    BOTTLES DRAWN AEROBIC AND ANAEROBIC Blood Culture adequate volume   Culture  Setup Time   Final    GRAM POSITIVE COCCI IN BOTH AEROBIC AND ANAEROBIC BOTTLES CRITICAL RESULT CALLED TO, READ BACK BY AND VERIFIED WITH: G ABBOTT PHARMD 11/05/17 0026 JDW    Culture (A)  Final    STAPHYLOCOCCUS AUREUS CULTURE REINCUBATED FOR BETTER GROWTH Performed at [facility], [place] [address], [place],  [address]    Report Status PENDING  Incomplete  Blood Culture ID Panel (Reflexed)     Status: Abnormal   Collection Time: 11/04/17 11:06 AM  Result Value Ref Range Status   Enterococcus species  NOT DETECTED NOT DETECTED Final   Listeria monocytogenes NOT DETECTED NOT DETECTED Final   Staphylococcus species DETECTED (A) NOT DETECTED Final    Comment: CRITICAL RESULT CALLED TO, READ BACK BY AND VERIFIED WITH: G ABBOTT PHARMD 11/05/17 0026 JDW    Staphylococcus aureus DETECTED (A) NOT DETECTED Final    Comment: Methicillin (oxacillin) susceptible Staphylococcus aureus (MSSA). Preferred therapy is anti staphylococcal beta lactam antibiotic (Cefazolin or Nafcillin), unless clinically contraindicated. CRITICAL RESULT CALLED TO, READ BACK BY AND VERIFIED WITH: G ABBOTT PHARMD 11/05/17 0026 JDW    Methicillin resistance NOT DETECTED NOT DETECTED Final   Streptococcus species NOT DETECTED NOT DETECTED Final   Streptococcus agalactiae NOT DETECTED NOT  DETECTED Final   Streptococcus pneumoniae NOT DETECTED NOT DETECTED Final   Streptococcus pyogenes NOT DETECTED NOT DETECTED Final   Acinetobacter baumannii NOT DETECTED NOT DETECTED Final   Enterobacteriaceae species NOT DETECTED NOT DETECTED Final   Enterobacter cloacae complex NOT DETECTED NOT DETECTED Final   Escherichia coli NOT DETECTED NOT DETECTED Final   Klebsiella oxytoca NOT DETECTED NOT DETECTED Final   Klebsiella pneumoniae NOT DETECTED NOT DETECTED Final   Proteus species NOT DETECTED NOT DETECTED Final   Serratia marcescens NOT DETECTED NOT DETECTED Final   Haemophilus influenzae NOT DETECTED NOT DETECTED Final   Neisseria meningitidis NOT DETECTED NOT DETECTED Final   Pseudomonas aeruginosa NOT DETECTED NOT DETECTED Final   Candida albicans NOT DETECTED NOT DETECTED Final   Candida glabrata NOT DETECTED NOT DETECTED Final   Candida krusei NOT DETECTED NOT DETECTED Final   Candida parapsilosis NOT DETECTED NOT DETECTED Final   Candida tropicalis NOT DETECTED NOT DETECTED Final    Comment: Performed at [facility], [place] [address] [place], [place] [address]  Culture, blood (Routine x 2)     Status: None (Preliminary  result)   Collection Time: 11/04/17 11:08 AM  Result Value Ref Range Status   Specimen Description BLOOD RIGHT ANTECUBITAL  Final   Special Requests   Final    BOTTLES DRAWN AEROBIC AND ANAEROBIC Blood Culture adequate volume   Culture  Setup Time   Final    IN BOTH AEROBIC AND ANAEROBIC BOTTLES GRAM POSITIVE COCCI CRITICAL RESULT CALLED TO, READ BACK BY AND VERIFIED WITH: G ABBOTT PHARMD 11/05/17 0026 JDW    Culture   Final    GRAM POSITIVE COCCI TOO YOUNG TO READ Performed at [facility], [place] [address], [place], [place] [address]    Report Status PENDING  Incomplete  Urine culture     Status: Abnormal (Preliminary result)   Collection Time: 11/04/17 11:09 AM  Result Value Ref Range Status   Specimen Description URINE, CATHETERIZED  Final   Special Requests   Final    NONE Performed at [facility], [place] [address], [place], [place] [address]    Culture 10,000 COLONIES/mL PSEUDOMONAS AERUGINOSA (A)  Final   Report Status PENDING  Incomplete      Radiology Studies: Dg Chest 2 View  Result Date: 11/04/2017 CLINICAL DATA:  Altered mental status. EXAM: CHEST - 2 VIEW COMPARISON:  02/07/2017 FINDINGS: There is no focal parenchymal opacity. There is no pleural effusion or pneumothorax. There is stable cardiomegaly. There is thoracic aortic atherosclerosis. Mild osteoarthritis of the right glenohumeral joint. IMPRESSION: No acute cardiopulmonary disease. Electronically Signed   By: Kathreen Devoid   On: 11/04/2017 11:40     Scheduled Meds: . allopurinol  300 mg Oral Daily  . aspirin  81 mg Oral Daily  . clopidogrel  75 mg Oral Q breakfast  . colchicine  0.6 mg Oral Daily  . docusate sodium  100 mg Oral BID  . ferrous sulfate  325 mg Oral Q breakfast  . insulin aspart  0-15 Units Subcutaneous TID WC  . insulin aspart  0-5 Units Subcutaneous QHS  . metoprolol tartrate  12.5 mg Oral BID  . rosuvastatin  10 mg Oral Daily   Continuous Infusions: .  ceFAZolin (ANCEF) IV  Stopped (11/05/17 0916)  . lactated ringers 75 mL/hr at 11/05/17 [place], PA-S [facility]  If 7PM-7AM, please contact night-coverage [email or web address] Password [email or web address] 11/05/2017, 1:45 PM

## 2017-11-06 ENCOUNTER — Encounter (HOSPITAL_COMMUNITY): Payer: Self-pay | Admitting: General Practice

## 2017-11-06 ENCOUNTER — Other Ambulatory Visit: Payer: Self-pay

## 2017-11-06 ENCOUNTER — Inpatient Hospital Stay (HOSPITAL_COMMUNITY): Payer: Medicare Other

## 2017-11-06 DIAGNOSIS — Z888 Allergy status to other drugs, medicaments and biological substances status: Secondary | ICD-10-CM

## 2017-11-06 DIAGNOSIS — J9601 Acute respiratory failure with hypoxia: Secondary | ICD-10-CM

## 2017-11-06 LAB — CBC
HEMATOCRIT: 29.1 % — AB (ref 36.0–46.0)
HEMATOCRIT: 31.8 % — AB (ref 36.0–46.0)
HEMOGLOBIN: 9.3 g/dL — AB (ref 12.0–15.0)
HEMOGLOBIN: 10.1 g/dL — AB (ref 12.0–15.0)
MCH: 31.6 pg (ref 26.0–34.0)
MCH: 31.3 pg (ref 26.0–34.0)
MCHC: 32 g/dL (ref 30.0–36.0)
MCHC: 31.8 g/dL (ref 30.0–36.0)
MCV: 99 fL (ref 78.0–100.0)
MCV: 98.5 fL (ref 78.0–100.0)
Platelets: 75 10*3/uL — ABNORMAL LOW (ref 150–400)
Platelets: 95 10*3/uL — ABNORMAL LOW (ref 150–400)
RBC: 2.94 MIL/uL — ABNORMAL LOW (ref 3.87–5.11)
RBC: 3.23 MIL/uL — ABNORMAL LOW (ref 3.87–5.11)
RDW: 13.9 % (ref 11.5–15.5)
RDW: 13.7 % (ref 11.5–15.5)
WBC: 4.7 10*3/uL (ref 4.0–10.5)
WBC: 4.7 10*3/uL (ref 4.0–10.5)

## 2017-11-06 LAB — URINE CULTURE: Culture: 10000 — AB

## 2017-11-06 LAB — BASIC METABOLIC PANEL
ANION GAP: 8 (ref 5–15)
BUN: 27 mg/dL — ABNORMAL HIGH (ref 6–20)
CO2: 22 mmol/L (ref 22–32)
Calcium: 8.9 mg/dL (ref 8.9–10.3)
Chloride: 109 mmol/L (ref 101–111)
Creatinine, Ser: 1.04 mg/dL — ABNORMAL HIGH (ref 0.44–1.00)
GFR calc Af Amer: 58 mL/min — ABNORMAL LOW (ref >60)
GFR, EST NON AFRICAN AMERICAN: 50 mL/min — AB (ref >60)
Glucose, Bld: 162 mg/dL — ABNORMAL HIGH (ref 65–99)
POTASSIUM: 3.9 mmol/L (ref 3.5–5.1)
SODIUM: 139 mmol/L (ref 135–145)

## 2017-11-06 LAB — ECHOCARDIOGRAM COMPLETE
Height: 63 in
Weight: 3915.37 oz

## 2017-11-06 LAB — GLUCOSE, CAPILLARY
GLUCOSE-CAPILLARY: 168 mg/dL — AB (ref 65–99)
Glucose-Capillary: 140 mg/dL — ABNORMAL HIGH (ref 65–99)
Glucose-Capillary: 211 mg/dL — ABNORMAL HIGH (ref 65–99)
Glucose-Capillary: 197 mg/dL — ABNORMAL HIGH (ref 65–99)

## 2017-11-06 LAB — CULTURE, BLOOD (ROUTINE X 2): SPECIAL REQUESTS: ADEQUATE

## 2017-11-06 MED ORDER — HYDRALAZINE HCL 50 MG PO TABS
50.0000 mg | ORAL_TABLET | Freq: Three times a day (TID) | ORAL | Status: DC
  Administered 2017-11-06 – 2017-11-10 (×11): 50 mg via ORAL
  Filled 2017-11-06 (×11): qty 1

## 2017-11-06 NOTE — Progress Notes (Signed)
PROGRESS NOTE  Carrie Monroe VUD:[id number] DOB: Dec 28, 1935 DOA: 11/04/2017 PCP: Marton Redwood, MD   LOS: 2 days   Attending MD note  Patient was seen, examined,treatment plan was discussed with the PA-S.  I have personally reviewed the clinical findings, lab, imaging studies and management of this patient in detail. 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  General surgery consulted due to scalp wound, infectious disease consulted due to her bacteremia.  BP (!) 140/58 (BP Location: Right Leg)   Pulse (!) 48   Temp 98.1 F (36.7 C) (Oral)   Resp 20   Ht 5' 3"  (1.6 m)   Wt 111 kg (244 lb 11.4 oz)   SpO2 97%   BMI 43.35 kg/m  On Exam: Gen. exam: Awake, alert, not in any distress Chest: Good air entry bilaterally, no rhonchi or rales CVS: S1-S2 regular, no murmurs Abdomen: Soft, nontender and nondistended Neurology: Non-focal Skin: No rash or lesions  Plan  Severe sepsis due to MSSA bacteremia in the setting of infected scalp incision -Met SIRS criteria, plus a source, as well as showed signs of en dorgan perfusion with encephalopathy -Remains clinically stable -Continue cefazolin -ID following, TTE pending today, if negative will need a TEE -Appreciate surgical consultation for scalp wound  Aortic stenosis status post TAVR -Stable, 2D echo pending  Chronic diastolic CHF -Continue to hold fluids, vitals are stable, she appears  euvolemic  Chronic kidney disease stage III -Creatinine appears at baseline, repeat tomorrow morning  Thrombocytopenia -Recent prior values were within normal limits however she did have thrombocytopenia in the past so suspect a chronic component potentially worsened by her sepsis -No bleeding, continue to monitor  Coronary artery disease -No chest pain  Diet-controlled type 2 diabetes mellitus  -Continue sliding scale  Hypertension -Continue Lopressor pressure stable today  Iron deficiency anemia/in the setting of chronic kidney disease -Continue iron supplementations, hemoglobin stable    Rest as below  Christa Fasig M. Cruzita Lederer, MD [facility] [phone]  If 7PM-7AM, please contact night-coverage [email or web address] Password [email or web address]   Brief Narrative / Interim history: 82 year old female with medical history significant for squamous cell cancer on the scalp, breast cancer s/p lumpectomy and XRT, aortic stenosis s/p TAVR 01/2017, T2DM, HTN, CKD stage 3, chronic diastolic CHF, anemia. She presented to the ED yesterday for fevers and altered mental status s/p squamous cell cancer removal from her scalp 5 days ago. Admitted to the hospital with MSSA bacteremia secondary to infected scalp wound.  Assessment & Plan: Principal Problem:   MSSA bacteremia Active Problems:   Diabetes mellitus type 2 in obese [facility])   Essential hypertension   Chronic renal insufficiency, stage III (moderate) (HCC)   S/P TAVR (transcatheter aortic valve replacement)   Acute respiratory failure with hypoxia (HCC)   Chronic diastolic CHF (congestive heart failure) (HCC)   Anemia   Wound infection after surgery  1. Severe sepsis due to MSSA bacteremia in the setting of infected scalp incision -Met SIRS criteria, plus source of infection, and  signs of end organ perfusion with encephalopathy. -Sepsis physiology resolved, patient is afebrile with no leukocytosis. -Blood culture grew staph  aureus. -Repeat blood cultures from this morning pending. -Cultures from scalp wound began growing gram positive cocci, results pending. -Continue IV Cefazolin. ID following. -TTE today to evaluate for endocarditis. If negative, will obtain TEE. -Surgery following for wound care.  2. Aortic stenosis S/P TAVR -On ASA and Plavix.  -TTE today to evaluate for endocarditis in the setting of MSSA bacteremia. If negative, will obtain TEE.  3. Chronic diastolic CHF, stable -Last EF 50-55% 03/06/17 -BNP 384 11/04/17 -Daily weights, strict I/Os  4. CKD stage 3 -Stable, BUN 28, Cr 1.34, GFR 37, all of which are around the patients baseline. -Continue to monitor through daily CMP.  5. Thrombocytopenia -Possibly due to sepsis as recent platelet levels were within normal limits. -Platelets 75 this morning. -No signs of bleeding, continue to monitor through CBC.  6. Bacturia -Urine culture grew pseudomonas, patient is asymptomatic.  -No indication for treatment at this time.  7. CAD -Patient had 3 stents placed in August 2018, currently on aspirin and Plavix. -No chest pain.  8. DM, diet controlled -Continue insulin sliding scale 3 times daily with meals.  9. HTN, stable -140/58 this morning. -Continue PO Lopressor 12.5 mg BID.  10. Iron deficiency anemia/ in the setting of CKD -Stable, hemoglobin 9.3 today. Her normal seems to run between 9-10.  -Continue to monitor with CBC. -Continue home iron supplementation  11. HLD -On statin   DVT prophylaxis: SCDs, Plavix, ASA Code Status: DNR Family Communication: No family at bedside Disposition Plan: TBD, home when ready  Consultants:   Surgery  Infectious Disease  Procedures:   2D echo: Ordered 11/05/17, pending  Antimicrobials:  IV Cefazolin 2 g q8hrs  Subjective: Patient resting well this morning. She reports she is feeling much better than yesterday and is a little bit less fatigued. She is off the O2 [place] this  morning during evaluation. She reports doing well on room air yesterday afternoon but required the O2 later in the evening. She reports a mild headache. Denies fever, chills, chest pain, SOB, abdominal pain, nausea, dysuria, increased urinary frequency.  Objective: Vitals:   11/05/17 1442 11/05/17 2133 11/06/17 0432 11/06/17 0434  BP: (!) 113/47 (!) 151/63 (!) 140/58   Pulse: 75 71 (!) 59 (!) 48  Resp: 18 20 20    Temp: 98.4 F (36.9 C)  98.1 F (36.7 C)   TempSrc: Oral  Oral   SpO2: 94% 94% 90% 97%  Weight:      Height:        Intake/Output Summary (Last 24 hours) at 11/06/2017 1213 Last data filed at 11/06/2017 0500 Gross per 24 hour  Intake 460 ml  Output 600 ml  Net -140 ml   Filed Weights   11/04/17 1048 11/04/17 1736  Weight: 104.3 kg (230 lb) 111 kg (244 lb 11.4 oz)    Examination:  Constitutional: NAD Eyes: EOM intact, lids and conjunctivae normal, sclera nonicteric ENMT: Mucous membranes are moist. No erythema or oropharyngeal exudates Neck: normal, supple. Respiratory: clear to auscultation bilaterally, no wheezing, no crackles. Normal respiratory effort. No accessory muscle use.  Cardiovascular: Regular rate and rhythm, grade 2/6 murmur noted. 1+ LE edema noted. 2+ pedal pulses.  Abdomen: no tenderness.  Skin: Scalp wound dressing dry, intact. Neurologic: Alert and oriented x 3.  Psychiatric: Normal judgment and insight. Normal mood.    Data Reviewed: I have independently reviewed following  labs and imaging studies   CBC: Recent Labs  Lab 11/04/17 1105 11/05/17 0534 11/06/17 0615  WBC 10.8* 5.4 4.7  NEUTROABS 9.4*  --   --   HGB 10.5* 9.9* 9.3*  HCT 32.3* 30.9* 29.1*  MCV 98.2 98.7 99.0  PLT 93* 82* 75*   Basic Metabolic Panel: Recent Labs  Lab 11/04/17 1105 11/05/17 0534  NA 137 134*  K 3.8 4.2  CL 107 106  CO2 20* 21*  GLUCOSE 207* 155*  BUN 28* 28*  CREATININE 1.36* 1.34*  CALCIUM 8.9 8.5*   GFR: Estimated Creatinine Clearance: 40.7  mL/min (A) (by C-G formula based on SCr of 1.34 mg/dL (H)). Liver Function Tests: Recent Labs  Lab 11/04/17 1105  AST 22  ALT 18  ALKPHOS 105  BILITOT 1.1  PROT 6.1*  ALBUMIN 3.0*   No results for input(s): LIPASE, AMYLASE in the last 168 hours. No results for input(s): AMMONIA in the last 168 hours. Coagulation Profile: Recent Labs  Lab 11/04/17 1105  INR 1.20   Cardiac Enzymes: No results for input(s): CKTOTAL, CKMB, CKMBINDEX, TROPONINI in the last 168 hours. BNP (last 3 results) No results for input(s): PROBNP in the last 8760 hours. HbA1C: No results for input(s): HGBA1C in the last 72 hours. CBG: Recent Labs  Lab 11/05/17 0731 11/05/17 1205 11/05/17 1700 11/05/17 2131 11/06/17 0910  GLUCAP 141* 171* 192* 269* 140*   Lipid Profile: No results for input(s): CHOL, HDL, LDLCALC, TRIG, CHOLHDL, LDLDIRECT in the last 72 hours. Thyroid Function Tests: No results for input(s): TSH, T4TOTAL, FREET4, T3FREE, THYROIDAB in the last 72 hours. Anemia Panel: No results for input(s): VITAMINB12, FOLATE, FERRITIN, TIBC, IRON, RETICCTPCT in the last 72 hours. Urine analysis:    Component Value Date/Time   COLORURINE YELLOW 11/04/2017 1105   APPEARANCEUR HAZY (A) 11/04/2017 1105   LABSPEC 1.018 11/04/2017 1105   PHURINE 5.0 11/04/2017 1105   GLUCOSEU NEGATIVE 11/04/2017 1105   HGBUR MODERATE (A) 11/04/2017 1105   BILIRUBINUR NEGATIVE 11/04/2017 1105   KETONESUR 20 (A) 11/04/2017 1105   PROTEINUR >=300 (A) 11/04/2017 1105   UROBILINOGEN 1.0 07/28/2013 1305   NITRITE NEGATIVE 11/04/2017 1105   LEUKOCYTESUR TRACE (A) 11/04/2017 1105   Sepsis Labs: Invalid input(s): PROCALCITONIN, LACTICIDVEN  Recent Results (from the past 240 hour(s))  Culture, blood (Routine x 2)     Status: Abnormal   Collection Time: 11/04/17 11:06 AM  Result Value Ref Range Status   Specimen Description BLOOD BLOOD RIGHT FOREARM  Final   Special Requests   Final    BOTTLES DRAWN AEROBIC AND  ANAEROBIC Blood Culture adequate volume   Culture  Setup Time   Final    GRAM POSITIVE COCCI IN BOTH AEROBIC AND ANAEROBIC BOTTLES CRITICAL RESULT CALLED TO, READ BACK BY AND VERIFIED WITH: Denton Brick [facility] 11/05/17 0026 JDW Performed at [facility], [place] [address], [place], [place] [address]    Culture STAPHYLOCOCCUS AUREUS (A)  Final   Report Status 11/06/2017 FINAL  Final   Organism ID, Bacteria STAPHYLOCOCCUS AUREUS  Final      Susceptibility   Staphylococcus aureus - MIC*    CIPROFLOXACIN <=0.5 SENSITIVE Sensitive     ERYTHROMYCIN >=8 RESISTANT Resistant     GENTAMICIN <=0.5 SENSITIVE Sensitive     OXACILLIN 0.5 SENSITIVE Sensitive     TETRACYCLINE <=1 SENSITIVE Sensitive     VANCOMYCIN 1 SENSITIVE Sensitive     TRIMETH/SULFA <=10 SENSITIVE Sensitive     CLINDAMYCIN RESISTANT Resistant  RIFAMPIN <=0.5 SENSITIVE Sensitive     Inducible Clindamycin POSITIVE Resistant     * STAPHYLOCOCCUS AUREUS  Blood Culture ID Panel (Reflexed)     Status: Abnormal   Collection Time: 11/04/17 11:06 AM  Result Value Ref Range Status   Enterococcus species NOT DETECTED NOT DETECTED Final   Listeria monocytogenes NOT DETECTED NOT DETECTED Final   Staphylococcus species DETECTED (A) NOT DETECTED Final    Comment: CRITICAL RESULT CALLED TO, READ BACK BY AND VERIFIED WITH: G ABBOTT PHARMD 11/05/17 0026 JDW    Staphylococcus aureus DETECTED (A) NOT DETECTED Final    Comment: Methicillin (oxacillin) susceptible Staphylococcus aureus (MSSA). Preferred therapy is anti staphylococcal beta lactam antibiotic (Cefazolin or Nafcillin), unless clinically contraindicated. CRITICAL RESULT CALLED TO, READ BACK BY AND VERIFIED WITH: G ABBOTT PHARMD 11/05/17 0026 JDW    Methicillin resistance NOT DETECTED NOT DETECTED Final   Streptococcus species NOT DETECTED NOT DETECTED Final   Streptococcus agalactiae NOT DETECTED NOT DETECTED Final   Streptococcus pneumoniae NOT DETECTED NOT DETECTED Final    Streptococcus pyogenes NOT DETECTED NOT DETECTED Final   Acinetobacter baumannii NOT DETECTED NOT DETECTED Final   Enterobacteriaceae species NOT DETECTED NOT DETECTED Final   Enterobacter cloacae complex NOT DETECTED NOT DETECTED Final   Escherichia coli NOT DETECTED NOT DETECTED Final   Klebsiella oxytoca NOT DETECTED NOT DETECTED Final   Klebsiella pneumoniae NOT DETECTED NOT DETECTED Final   Proteus species NOT DETECTED NOT DETECTED Final   Serratia marcescens NOT DETECTED NOT DETECTED Final   Haemophilus influenzae NOT DETECTED NOT DETECTED Final   Neisseria meningitidis NOT DETECTED NOT DETECTED Final   Pseudomonas aeruginosa NOT DETECTED NOT DETECTED Final   Candida albicans NOT DETECTED NOT DETECTED Final   Candida glabrata NOT DETECTED NOT DETECTED Final   Candida krusei NOT DETECTED NOT DETECTED Final   Candida parapsilosis NOT DETECTED NOT DETECTED Final   Candida tropicalis NOT DETECTED NOT DETECTED Final    Comment: Performed at [facility], [place] [place], [place] [address]  Culture, blood (Routine x 2)     Status: Abnormal (Preliminary result)   Collection Time: 11/04/17 11:08 AM  Result Value Ref Range Status   Specimen Description BLOOD RIGHT ANTECUBITAL  Final   Special Requests   Final    BOTTLES DRAWN AEROBIC AND ANAEROBIC Blood Culture adequate volume   Culture  Setup Time   Final    IN BOTH AEROBIC AND ANAEROBIC BOTTLES GRAM POSITIVE COCCI CRITICAL RESULT CALLED TO, READ BACK BY AND VERIFIED WITH: Denton Brick [facility] 11/05/17 0026 JDW Performed at [facility], [place] [address], [place], [place] [address]    Culture (A)  Final    STAPHYLOCOCCUS AUREUS SUSCEPTIBILITIES PERFORMED ON PREVIOUS CULTURE WITHIN THE LAST 5 DAYS.    Report Status PENDING  Incomplete  Urine culture     Status: Abnormal   Collection Time: 11/04/17 11:09 AM  Result Value Ref Range Status   Specimen Description URINE, CATHETERIZED  Final   Special Requests   Final     NONE Performed at [facility], [address], [place], [place] [address]    Culture 10,000 COLONIES/mL PSEUDOMONAS AERUGINOSA (A)  Final   Report Status 11/06/2017 FINAL  Final   Organism ID, Bacteria PSEUDOMONAS AERUGINOSA (A)  Final      Susceptibility   Pseudomonas aeruginosa - MIC*    CEFTAZIDIME 4 SENSITIVE Sensitive     CIPROFLOXACIN <=0.25 SENSITIVE Sensitive  GENTAMICIN <=1 SENSITIVE Sensitive     IMIPENEM 2 SENSITIVE Sensitive     CEFEPIME 4 SENSITIVE Sensitive     * 10,000 COLONIES/mL PSEUDOMONAS AERUGINOSA  Aerobic Culture (superficial specimen)     Status: None (Preliminary result)   Collection Time: 11/05/17  1:24 PM  Result Value Ref Range Status   Specimen Description WOUND SCALP  Final   Special Requests NONE  Final   Gram Stain NO WBC SEEN FEW GRAM POSITIVE COCCI IN PAIRS   Final   Culture   Final    MODERATE STAPHYLOCOCCUS AUREUS SUSCEPTIBILITIES TO FOLLOW Performed at [facility], [place] [address], [place], [place] [address]    Report Status PENDING  Incomplete      Radiology Studies: No results found.   Scheduled Meds: . allopurinol  300 mg Oral Daily  . aspirin  81 mg Oral Daily  . clopidogrel  75 mg Oral Q breakfast  . colchicine  0.6 mg Oral Daily  . docusate sodium  100 mg Oral BID  . ferrous sulfate  325 mg Oral Q breakfast  . insulin aspart  0-15 Units Subcutaneous TID WC  . insulin aspart  0-5 Units Subcutaneous QHS  . metoprolol tartrate  12.5 mg Oral BID  . rosuvastatin  10 mg Oral Daily   Continuous Infusions: .  ceFAZolin (ANCEF) IV Stopped (11/06/17 2878)     Collier Salina, PA-S [facility] Pager [phone]  If 7PM-7AM, please contact night-coverage [email or web address] Password [facility] 11/06/2017, 12:13 PM

## 2017-11-06 NOTE — Progress Notes (Signed)
[facility]/Trauma Progress Note      Assessment/Plan MSSA bacteremia secondary to scalp wound infection, s/p excision of squamous cell carcinoma by dermatology as outpatient -wound opened, culture pending -NS wet-to-dry dressing changes BID -will need to follow up with her dermatologist as an outpatient for further wound care -abx therapy per primary service  JME:QASTM healthy VTE: SCD's, Plavix and aspirin ID: vancomycin 06/10-06/11    Ancef 06/11>> Foley: wick Follow up: her dermatologist  DISPO: BID wet to dry packing changes, continue antibiotics per primary. We will follow     LOS: 2 days    Subjective: CC: Infected scalp wound  Patient states mild pain of her scalp. She states she is feeling much better. No issues overnight. She states the dressing was changed roughly 06 100 this morning  Objective: Vital signs in last 24 hours: Temp:  [98.1 F (36.7 C)-98.4 F (36.9 C)] 98.1 F (36.7 C) (06/12 0432) Pulse Rate:  [48-75] 48 (06/12 0434) Resp:  [18-20] 20 (06/12 0432) BP: (113-151)/(47-63) 140/58 (06/12 0432) SpO2:  [90 %-97 %] 97 % (06/12 0434) Last BM Date: 11/03/17  Intake/Output from previous day: 06/11 0701 - 06/12 0700 In: 820 [P.O.:720; IV Piggyback:100] Out: 825 [Urine:825] Intake/Output this shift: No intake/output data recorded.  PE: Gen:  Alert, NAD, pleasant, cooperative HEENT: Scalp wound with improved surrounding erythema, packing in place, no active drainage or bleeding noted Pulm:  Rate and effort normal Skin: warm and dry   Anti-infectives: Anti-infectives (From admission, onward)   Start     Dose/Rate Route Frequency Ordered Stop   11/05/17 0200  ceFAZolin (ANCEF) IVPB 2g/100 mL premix     2 g 200 mL/hr over 30 Minutes Intravenous Every 8 hours 11/05/17 0149     11/04/17 1330  vancomycin (VANCOCIN) 1,250 mg in sodium chloride 0.9 % 250 mL IVPB  Status:  Discontinued     1,250 mg 166.7 mL/hr over 90 Minutes Intravenous  Every 24 hours 11/04/17 1311 11/05/17 0149      Lab Results:  Recent Labs    11/05/17 0534 11/06/17 0615  WBC 5.4 4.7  HGB 9.9* 9.3*  HCT 30.9* 29.1*  PLT 82* 75*   BMET Recent Labs    11/04/17 1105 11/05/17 0534  NA 137 134*  K 3.8 4.2  CL 107 106  CO2 20* 21*  GLUCOSE 207* 155*  BUN 28* 28*  CREATININE 1.36* 1.34*  CALCIUM 8.9 8.5*   PT/INR Recent Labs    11/04/17 1105  LABPROT 15.1  INR 1.20   CMP     Component Value Date/Time   NA 134 (L) 11/05/2017 0534   NA 141 06/11/2017 1623   NA 141 02/23/2015 1029   K 4.2 11/05/2017 0534   K 4.3 02/23/2015 1029   CL 106 11/05/2017 0534   CL 111 (H) 08/28/2012 1224   CO2 21 (L) 11/05/2017 0534   CO2 26 02/23/2015 1029   GLUCOSE 155 (H) 11/05/2017 0534   GLUCOSE 193 (H) 02/23/2015 1029   GLUCOSE 103 (H) 08/28/2012 1224   BUN 28 (H) 11/05/2017 0534   BUN 24 06/11/2017 1623   BUN 20.2 02/23/2015 1029   CREATININE 1.34 (H) 11/05/2017 0534   CREATININE 1.3 (H) 02/23/2015 1029   CALCIUM 8.5 (L) 11/05/2017 0534   CALCIUM 9.5 02/23/2015 1029   PROT 6.1 (L) 11/04/2017 1105   PROT 6.6 02/23/2015 1029   ALBUMIN 3.0 (L) 11/04/2017 1105   ALBUMIN 3.4 (L) 02/23/2015 1029   AST 22 11/04/2017  1105   AST 15 02/23/2015 1029   ALT 18 11/04/2017 1105   ALT 10 02/23/2015 1029   ALKPHOS 105 11/04/2017 1105   ALKPHOS 76 02/23/2015 1029   BILITOT 1.1 11/04/2017 1105   BILITOT 0.56 02/23/2015 1029   GFRNONAA 37 (L) 11/05/2017 0534   GFRAA 42 (L) 11/05/2017 0534   Lipase  No results found for: LIPASE  Studies/Results: Dg Chest 2 View  Result Date: 11/04/2017 CLINICAL DATA:  Altered mental status. EXAM: CHEST - 2 VIEW COMPARISON:  02/07/2017 FINDINGS: There is no focal parenchymal opacity. There is no pleural effusion or pneumothorax. There is stable cardiomegaly. There is thoracic aortic atherosclerosis. Mild osteoarthritis of the right glenohumeral joint. IMPRESSION: No acute cardiopulmonary disease. Electronically Signed    By: Kathreen Devoid   On: 11/04/2017 11:40      Kalman Drape , [facility] 11/06/2017, 10:39 AM  Pager: [phone] Mon-Wed, Friday 7:00am-4:30pm Thurs 7am-11:30am  Consults: [phone]

## 2017-11-06 NOTE — Progress Notes (Signed)
  Echocardiogram 2D Echocardiogram has been performed.  Carrie Monroe 11/06/2017, 2:04 PM

## 2017-11-06 NOTE — Progress Notes (Signed)
[facility] Infusion Coordinator will follow pt with ID team to support home IV ABX as ordered at DC.  If patient discharges after hours, please call [phone].   Carrie Monroe 11/06/2017, 11:58 PM

## 2017-11-06 NOTE — Progress Notes (Signed)
Inpatient Diabetes Program Recommendations  AACE/ADA: New Consensus Statement on Inpatient Glycemic Control (2015)  Target Ranges:  Prepandial:   less than 140 mg/dL      Peak postprandial:   less than 180 mg/dL (1-2 hours)      Critically ill patients:  140 - 180 mg/dL   Lab Results  Component Value Date   GLUCAP 168 (H) 11/06/2017   HGBA1C 5.8 (H) 02/01/2017    Review of Glycemic ControlResults for MATIE, DIMAANO (MRN [id number]) as of 11/06/2017 15:13  Ref. Range 11/05/2017 12:05 11/05/2017 17:00 11/05/2017 21:31 11/06/2017 09:10 11/06/2017 12:40  Glucose-Capillary Latest Ref Range: 65 - 99 mg/dL 171 (H) 192 (H) 269 (H) 140 (H) 168 (H)    Diabetes history: Type 2 DM- diet controlled Outpatient Diabetes medications: None Current orders for Inpatient glycemic control:  Novolog moderate tid with meals and HS  Inpatient Diabetes Program Recommendations:    Please consider checking A1C to determine pre-hospital glycemic control.    Thanks,  Adah Perl, RN, BC-ADM Inpatient Diabetes Coordinator Pager [phone] (8a-5p)

## 2017-11-06 NOTE — Progress Notes (Signed)
[facility] for Infectious Disease  Date of Admission:  11/04/2017   Total days of antibiotics 2        Day 2 cefazolin          Patient ID: Carrie Monroe is a 82 y.o. female with  Principal Problem:   MSSA bacteremia Active Problems:   Wound infection after surgery   S/P TAVR (transcatheter aortic valve replacement)   Diabetes mellitus type 2 in obese [facility])   Essential hypertension   Chronic renal insufficiency, stage III (moderate) (HCC)   Acute respiratory failure with hypoxia (HCC)   Chronic diastolic CHF (congestive heart failure) (HCC)   Anemia   . allopurinol  300 mg Oral Daily  . aspirin  81 mg Oral Daily  . clopidogrel  75 mg Oral Q breakfast  . colchicine  0.6 mg Oral Daily  . docusate sodium  100 mg Oral BID  . ferrous sulfate  325 mg Oral Q breakfast  . insulin aspart  0-15 Units Subcutaneous TID WC  . insulin aspart  0-5 Units Subcutaneous QHS  . metoprolol tartrate  12.5 mg Oral BID  . rosuvastatin  10 mg Oral Daily    SUBJECTIVE: Feeling better. More awake today. Surgery team drained remaining area of fluctuance along scalp incision. No fevers overnight. Repeat blood cultures pending. Her echo is scheduled this afternoon at 3pm. She is hopeful she can go home Saturday. Lives by herself but has a daughter coming to visit.   Allergies  Allergen Reactions  . Oxycodone Other (See Comments)    Hallunication    OBJECTIVE: Vitals:   11/05/17 1442 11/05/17 2133 11/06/17 0432 11/06/17 0434  BP: (!) 113/47 (!) 151/63 (!) 140/58   Pulse: 75 71 (!) 59 (!) 48  Resp: 18 20 20    Temp: 98.4 F (36.9 C)  98.1 F (36.7 C)   TempSrc: Oral  Oral   SpO2: 94% 94% 90% 97%  Weight:      Height:       Body mass index is 43.35 kg/m.  Physical Exam  Constitutional: She is oriented to person, place, and time. She appears well-developed and well-nourished.  Resting comfortably in bed. Smiling.   HENT:  Mouth/Throat: Mucous membranes are normal. No  oral lesions. Normal dentition. No dental abscesses. No oropharyngeal exudate.  Scalp is open and packed. Bloody drainage. Much less swelling.   Cardiovascular: Normal rate, regular rhythm and normal heart sounds.  No murmur heard. Pulmonary/Chest: Effort normal and breath sounds normal.  Abdominal: Soft. She exhibits no distension. There is no tenderness.  Lymphadenopathy:    She has no cervical adenopathy.  Neurological: She is alert and oriented to person, place, and time.  Skin: Skin is warm and dry. No rash noted.  Psychiatric: She has a normal mood and affect. Judgment normal.    Lab Results Lab Results  Component Value Date   WBC 4.7 11/06/2017   HGB 9.3 (L) 11/06/2017   HCT 29.1 (L) 11/06/2017   MCV 99.0 11/06/2017   PLT 75 (L) 11/06/2017    Lab Results  Component Value Date   CREATININE 1.34 (H) 11/05/2017   BUN 28 (H) 11/05/2017   NA 134 (L) 11/05/2017   K 4.2 11/05/2017   CL 106 11/05/2017   CO2 21 (L) 11/05/2017    Lab Results  Component Value Date   ALT 18 11/04/2017   AST 22 11/04/2017   ALKPHOS 105 11/04/2017   BILITOT  1.1 11/04/2017     Microbiology: BCx 11/04/17 >> 2/2 MSSA (R-clindamycin, erythromycin)  Scalp Culture 6/11 >> few GPC in pairs on GS   ASSESSMENT: 82 y.o. female admitted with fevers and AMS following recent excision of SCC from her scalp. Found to have MSSA bacteremia in 2/2 cultures and has been narrowed to cefazolin. Fevers have resolved x 24h. No other signs of metastatic infection. Possible sources for bacteremia could be scalp infection (also with gram positive cocci in pairs on gram stain and apparently she had a lot of bleeding post op) or alternatively her prosthetic TAVR valve. She is scheduled to have her TTE today. If negative would recommend TEE.   PLAN: 1. MSSA Bacteremia = TTE today; if negative would check TEE. Continue Cefazolin. Briefly discussed PICC line with her. Hold on placement for now. Repeat cultures pending.    2. Surgical Site Infection = appreciate surgery draining site. Care per their direction.   3. Medication Monitoring = wbc normal. Platelets low - seems as this is chronic (133k in 03/2017) and likely exacerbated by sepsis.   Janene Madeira, MSN, NP-C [facility] for Infectious [place] Cell: [phone] Pager: [phone]  11/06/2017  10:22 AM

## 2017-11-07 ENCOUNTER — Inpatient Hospital Stay: Payer: Self-pay

## 2017-11-07 ENCOUNTER — Encounter (HOSPITAL_COMMUNITY)
Admission: EM | Disposition: A | Discharge status: Home Health | Payer: Self-pay | Source: Home / Self Care | Attending: Internal Medicine

## 2017-11-07 ENCOUNTER — Encounter (HOSPITAL_COMMUNITY): Payer: Self-pay

## 2017-11-07 ENCOUNTER — Inpatient Hospital Stay (HOSPITAL_COMMUNITY): Payer: Medicare Other

## 2017-11-07 DIAGNOSIS — Z885 Allergy status to narcotic agent status: Secondary | ICD-10-CM

## 2017-11-07 DIAGNOSIS — D696 Thrombocytopenia, unspecified: Secondary | ICD-10-CM

## 2017-11-07 DIAGNOSIS — Z5181 Encounter for therapeutic drug level monitoring: Secondary | ICD-10-CM

## 2017-11-07 DIAGNOSIS — B9561 Methicillin susceptible Staphylococcus aureus infection as the cause of diseases classified elsewhere: Secondary | ICD-10-CM

## 2017-11-07 DIAGNOSIS — C4442 Squamous cell carcinoma of skin of scalp and neck: Secondary | ICD-10-CM

## 2017-11-07 DIAGNOSIS — I340 Nonrheumatic mitral (valve) insufficiency: Secondary | ICD-10-CM

## 2017-11-07 HISTORY — PX: TEE WITHOUT CARDIOVERSION: SHX5443

## 2017-11-07 LAB — GLUCOSE, CAPILLARY
GLUCOSE-CAPILLARY: 146 mg/dL — AB (ref 65–99)
Glucose-Capillary: 188 mg/dL — ABNORMAL HIGH (ref 65–99)
Glucose-Capillary: 176 mg/dL — ABNORMAL HIGH (ref 65–99)
Glucose-Capillary: 148 mg/dL — ABNORMAL HIGH (ref 65–99)

## 2017-11-07 LAB — CULTURE, BLOOD (ROUTINE X 2): Special Requests: ADEQUATE

## 2017-11-07 SURGERY — ECHOCARDIOGRAM, TRANSESOPHAGEAL
Anesthesia: Moderate Sedation

## 2017-11-07 MED ORDER — LACTATED RINGERS IV SOLN
INTRAVENOUS | Status: DC
  Administered 2017-11-07: 14:00:00 via INTRAVENOUS

## 2017-11-07 MED ORDER — BUTAMBEN-TETRACAINE-BENZOCAINE 2-2-14 % EX AERO
INHALATION_SPRAY | CUTANEOUS | Status: DC | PRN
  Administered 2017-11-07: 2 via TOPICAL

## 2017-11-07 MED ORDER — MIDAZOLAM HCL 10 MG/2ML IJ SOLN
INTRAMUSCULAR | Status: DC | PRN
  Administered 2017-11-07: 2 mg via INTRAVENOUS

## 2017-11-07 MED ORDER — SODIUM CHLORIDE 0.9 % IV SOLN: INTRAVENOUS | Status: DC

## 2017-11-07 MED ORDER — FENTANYL CITRATE (PF) 100 MCG/2ML IJ SOLN
INTRAMUSCULAR | Status: AC
  Filled 2017-11-07: qty 2

## 2017-11-07 MED ORDER — MIDAZOLAM HCL 5 MG/ML IJ SOLN
INTRAMUSCULAR | Status: AC
  Filled 2017-11-07: qty 2

## 2017-11-07 MED ORDER — FENTANYL CITRATE (PF) 100 MCG/2ML IJ SOLN
INTRAMUSCULAR | Status: DC | PRN
  Administered 2017-11-07: 25 ug via INTRAVENOUS

## 2017-11-07 NOTE — Care Management Note (Signed)
Case Management Note  Patient Details  Name: TOYOKO SILOS MRN: [id number] Date of Birth: Aug 23, 1935  Subjective/Objective:                    Action/Plan:  Consult for home IV ABX . Spoke with patient at bedside. She has decided against a PICC line and IV ABX at home.   S Dixon with ID aware. Pam with [facility] company aware.  Will continue to follow.  Expected Discharge Date:                  Expected Discharge Plan:  [place]  In-House Referral:     Discharge planning Services  CM Consult  Post Acute Care Choice:  Home Health Choice offered to:  Patient  DME Arranged:  N/A DME Agency:  NA  HH Arranged:    [place] Agency:     Status of Service:  In process, will continue to follow  If discussed at Long Length of Stay Meetings, dates discussed:    Additional Comments:  Marilu Favre, RN 11/07/2017, 12:56 PM

## 2017-11-07 NOTE — Progress Notes (Signed)
  Echocardiogram Echocardiogram Transesophageal has been performed.  Carrie Monroe T Donni Oglesby 11/07/2017, 2:55 PM

## 2017-11-07 NOTE — Interval H&P Note (Signed)
History and Physical Interval Note:  11/07/2017 12:58 PM  Carrie Monroe  has presented today for surgery, with the diagnosis of bacteremia  The various methods of treatment have been discussed with the patient and family. After consideration of risks, benefits and other options for treatment, the patient has consented to  Procedure(s): TRANSESOPHAGEAL ECHOCARDIOGRAM (TEE) (N/A) as a surgical intervention .  [name] history has been reviewed, patient examined, no change in status, stable for surgery.  I have reviewed the patient's chart and labs.  Questions were answered to the patient's satisfaction.     Rabab Currington

## 2017-11-07 NOTE — Progress Notes (Signed)
    [facility] has been requested to perform a transesophageal echocardiogram on 06/13 for bacteremia.  After careful review of history and examination, the risks and benefits of transesophageal echocardiogram have been explained including risks of esophageal damage, perforation (1:10,000 risk), bleeding, pharyngeal hematoma as well as other potential complications associated with conscious sedation including aspiration, arrhythmia, respiratory failure and death. Alternatives to treatment were discussed, questions were answered. Patient is willing to proceed.   Rosaria Ferries, PA-C 11/07/2017 11:30 AM

## 2017-11-07 NOTE — Care Management Important Message (Signed)
Important Message  Patient Details  Name: Carrie Monroe MRN: [id number] Date of Birth: [birth year]   Medicare Important Message Given:  Yes    [name] Circle 11/07/2017, 2:59 PM

## 2017-11-07 NOTE — Progress Notes (Signed)
Patient kept NPO for a procedure today.

## 2017-11-07 NOTE — Op Note (Signed)
INDICATIONS: bacteremia  PROCEDURE:   Informed consent was obtained prior to the procedure. The risks, benefits and alternatives for the procedure were discussed and the patient comprehended these risks.  Risks include, but are not limited to, cough, sore throat, vomiting, nausea, somnolence, esophageal and stomach trauma or perforation, bleeding, low blood pressure, aspiration, pneumonia, infection, trauma to the teeth and death.    After a procedural time-out, the oropharynx was anesthetized with 20% benzocaine spray.   During this procedure the patient was administered a total of Versed 2 mg and Fentanyl 25 mcg to achieve and maintain moderate conscious sedation.  [name] heart rate, blood pressure, and oxygen saturationweare monitored continuously during the procedure. The period of conscious sedation was 12 minutes, of which I was present face-to-face 100% of this time.  The transesophageal probe was inserted in the esophagus and stomach without difficulty and multiple views were obtained.  The patient was kept under observation until the patient left the procedure room.  The patient left the procedure room in stable condition.   Agitated microbubble saline contrast was not administered.  COMPLICATIONS:    There were no immediate complications.  FINDINGS:  No vegetations seen. Normal TAVR. Normal LVEF. Moderate PAH.  RECOMMENDATIONS:     No evidence of endocarditis.  Time Spent Directly with the Patient:  45 minutes   [name] 11/07/2017, 2:39 PM

## 2017-11-07 NOTE — Progress Notes (Signed)
PROGRESS NOTE  Carrie Monroe ZOX:[id number] DOB: [birth year] DOA: 11/04/2017 PCP: Marton Redwood, MD   LOS: 3 days   Attending MD note  Patient was seen, examined,treatment plan was discussed with the PA-S.  I have personally reviewed the clinical findings, lab, imaging studies and management of this patient in detail. I agree with the documentation, as recorded by the PA-S  Patient is  Patient is82 year old female with history of coronary artery disease with prior stents, aortic stenosis status post TAVR in September 2018, diet-controlled diabetes mellitus, hypertension, chronic kidney disease stage III with baseline creatinine around 8.1-1.9, chronic diastolic CHF who recently had squamous cell carcinoma of the scalp removed in the outpatient setting presents to the hospital with sepsis in the setting of cellulitis due to scalp infection, she was also found to have MSSA bacteremia.  General surgery consulted due to scalp wound, infectious disease consulted due to her bacteremia.   BP (!) 192/58   Pulse 60   Temp 97.9 F (36.6 C) (Oral)   Resp 18   Ht 5' 3" (1.6 m)   Wt 111 kg (244 lb 11.4 oz)   SpO2 100%   BMI 43.35 kg/m  On Exam: Gen. exam: Awake, alert, not in any distress Chest: Good air entry bilaterally, no rhonchi or rales CVS: S1-S2 regular, no murmurs Abdomen: Soft, nontender and nondistended Neurology: Non-focal Skin: No rash or lesions  Plan  Severe sepsis due to MSSA bacteremia in the setting of infected scalp incision -Met SIRS criteria, plus a source, as well as showed signs of en dorgan perfusion with encephalopathy -Remains clinically stable, sepsis physiology resolved -2D echo on 6/12- for vegetations, patient to undergo a TEE today.  Continue cefazolin, duration of antibiotics based on TEE results -Surveillance cultures have remained negative, likely put a PICC line in 1 to 2 days if they continue to stay negative  Aortic stenosis status post  TAVR -Stable, TEE today  Chronic diastolic CHF -Holding fluids, she is eating well -She appears euvolemic -2D echo yesterday with slightly depressed EF 45 to 50%, however a TEE will better characterize today  Chronic kidney disease stage III -Creatinine appears at baseline  Thrombocytopenia -Recent prior values were within normal limits however she did have thrombocytopenia in the past so suspect a chronic component potentially worsened by her sepsis -No bleeding, continue to monitor, platelets improving  Coronary artery disease -No chest pain  Diet-controlled type 2 diabetes mellitus -Continue sliding scale  Hypertension -Continue Lopressor pressure stable today  Iron deficiencyanemia/in the setting of chronic kidney disease -Continue iron supplementations, hemoglobin stable    Rest as below   M. Cruzita Lederer, MD [facility] [phone]  If 7PM-7AM, please contact night-coverage [email or web address] Password [email or web address]    Brief Narrative / Interim history: 82 year old female with medical history significant for squamous cell cancer on the scalp, breast cancer s/p lumpectomy and XRT, aortic stenosis s/p TAVR 01/2017, T2DM, HTN, CKD stage 3, chronic diastolic CHF, anemia. She presented to the ED yesterday for fevers and altered mental status s/p squamous cell cancer removal from her scalp 5 days ago. Admitted to the hospital with MSSA bacteremia secondary to infected scalp wound. Blood cultures grew staph aureus, IV Cephazolin given. Sepsis physiology resolved, scalp wound improving. TTE negative for signs of vegetation.   Assessment & Plan: Principal Problem:   MSSA bacteremia Active Problems:   Diabetes mellitus type 2 in obese [facility])   Essential hypertension   Chronic renal insufficiency, stage III (moderate) (HCC)  S/P TAVR (transcatheter aortic valve replacement)   Acute respiratory failure with hypoxia (HCC)   Chronic diastolic CHF (congestive heart  failure) (HCC)   Anemia   Wound infection after surgery  1. Severe sepsis due to MSSA bacteremia in the setting of infected scalp incision -Sepsis physiology resolved, patient is afebrile, no leukocytosis, scalp wound appears to be improving -Blood cultures from 11/04/17 grew staph aureus. -Repeat blood culture this morning. -Continue IV Cephazolin, ID following. -TEE today to evaluated for endocarditis in setting of MSSA bacteremia.  2. Aortic stenosis s/p TAVR -Stable -TTE yesterday showed no signs of vegetation, EF 45-50%, with abnormal ventricular septal function showing intraventricular conduction delay. -Will consult with Cardiology regarding intraventricular conduction delay -TEE today to evaluate for endocarditis in the setting of MSSA bacteremia.  3. Chronic diastolic CHF, stable -Last EF 50-55% 03/06/17 -Patient appears euvolemic -Strict I/Os  4. CKD stage 3 -Stable; BUN, Cr, GFR at baseline -Check BMP in AM  5. Thrombocytopenia -Platelet count increased to 95 this am, seem to be trending upwards -No signs of bleeding, continue to monitor with CBC  6. Bacteriuria -Urine culture positive for pseudomonas -No UTI symptoms  7. CAD -Cardiac cath with 3 stents in August 2018 -On ASA and plavix -No chest pain  8. DM, diet controlled -Continue sliding scale insulin  9. HTN -SBP in 150s last night, 130's this am -Patient started on home dose of hydralazine 51m TID -Continue home meds, Lopressor 25 mg BID and Hydralazine 535mTID  10. HLD -On statin  DVT prophylaxis: ASA, plavix Code Status: DNR Family Communication: No family members at bedside Disposition Plan: TBD, home when ready  Consultants:   Infectious Disease  Surgery  Procedures:   2D echo: No signs of vegetation, EF 45-50%, with abnormal ventricular septal function showing intraventricular conduction delay  Antimicrobials:  Cefazolin   Subjective: Patient is resting well in the chair  today, appears well. States she continues to feel better. Her fatigue has improved somewhat. Her only complaint is a mild right sided headache. She states it feels like pressure and sometimes moves over to the left side as well. She has been able to ambulate to the bathroom without much difficulty, but has not walked around much other than that. She denies fever, chills, chest pain, SOB, nausea, dysuria.  Objective: Vitals:   11/06/17 0434 11/06/17 1412 11/06/17 2143 11/07/17 0610  BP:  (!) 155/59 (!) 157/73 136/68  Pulse: (!) 48 65 75 66  Resp:  _0 Temp:   98.4 F (36.9 C) 98.4 F (36.9 C)  TempSrc:   Oral Oral  SpO2: 97% 96% 94% 97%  Weight:      Height:        Intake/Output Summary (Last 24 hours) at 11/07/2017 1128 Last data filed at 11/07/2017 1009 Gross per 24 hour  Intake 190 ml  Output -  Net 190 ml   Filed Weights   11/04/17 1048 11/04/17 1736  Weight: 104.3 kg (230 lb) 111 kg (244 lb 11.4 oz)    Examination:  Constitutional: NAD Eyes: PERRL, EOMI, lids and conjunctivae normal, sclera nonicteric ENMT: Mucous membranes are moist. No erythema or oropharyngeal exudates Neck: normal, supple Respiratory: clear to auscultation bilaterally, no wheezing, no crackles. Normal respiratory effort. No accessory muscle use.  Cardiovascular: Regular rate and rhythm, grade 2/6 murmur noted, 1+ LE edema. 2+ pedal pulses. No carotid bruits.  Abdomen: no tenderness.   Skin: no rashes, lesions, ulcers. No induration Neurologic: Grossly  non-focal, alert and oriented x 3 Psychiatric: Normal judgment and insight. Normal mood.    Data Reviewed: I have independently reviewed following labs and imaging studies.  CBC: Recent Labs  Lab 11/04/17 1105 11/05/17 0534 11/06/17 0615 11/06/17 1400  WBC 10.8* 5.4 4.7 4.7  NEUTROABS 9.4*  --   --   --   HGB 10.5* 9.9* 9.3* 10.1*  HCT 32.3* 30.9* 29.1* 31.8*  MCV 98.2 98.7 99.0 98.5  PLT 93* 82* 75* 95*   Basic Metabolic  Panel: Recent Labs  Lab 11/04/17 1105 11/05/17 0534 11/06/17 1400  NA 137 134* 139  K 3.8 4.2 3.9  CL 107 106 109  CO2 20* 21* 22  GLUCOSE 207* 155* 162*  BUN 28* 28* 27*  CREATININE 1.36* 1.34* 1.04*  CALCIUM 8.9 8.5* 8.9   GFR: Estimated Creatinine Clearance: 52.5 mL/min (A) (by C-G formula based on SCr of 1.04 mg/dL (H)). Liver Function Tests: Recent Labs  Lab 11/04/17 1105  AST 22  ALT 18  ALKPHOS 105  BILITOT 1.1  PROT 6.1*  ALBUMIN 3.0*   No results for input(s): LIPASE, AMYLASE in the last 168 hours. No results for input(s): AMMONIA in the last 168 hours. Coagulation Profile: Recent Labs  Lab 11/04/17 1105  INR 1.20   Cardiac Enzymes: No results for input(s): CKTOTAL, CKMB, CKMBINDEX, TROPONINI in the last 168 hours. BNP (last 3 results) No results for input(s): PROBNP in the last 8760 hours. HbA1C: No results for input(s): HGBA1C in the last 72 hours. CBG: Recent Labs  Lab 11/06/17 0910 11/06/17 1240 11/06/17 1705 11/06/17 2144 11/07/17 0759  GLUCAP 140* 168* 211* 197* 188*   Lipid Profile: No results for input(s): CHOL, HDL, LDLCALC, TRIG, CHOLHDL, LDLDIRECT in the last 72 hours. Thyroid Function Tests: No results for input(s): TSH, T4TOTAL, FREET4, T3FREE, THYROIDAB in the last 72 hours. Anemia Panel: No results for input(s): VITAMINB12, FOLATE, FERRITIN, TIBC, IRON, RETICCTPCT in the last 72 hours. Urine analysis:    Component Value Date/Time   COLORURINE YELLOW 11/04/2017 1105   APPEARANCEUR HAZY (A) 11/04/2017 1105   LABSPEC 1.018 11/04/2017 1105   PHURINE 5.0 11/04/2017 1105   GLUCOSEU NEGATIVE 11/04/2017 1105   HGBUR MODERATE (A) 11/04/2017 1105   BILIRUBINUR NEGATIVE 11/04/2017 1105   KETONESUR 20 (A) 11/04/2017 1105   PROTEINUR >=300 (A) 11/04/2017 1105   UROBILINOGEN 1.0 07/28/2013 1305   NITRITE NEGATIVE 11/04/2017 1105   LEUKOCYTESUR TRACE (A) 11/04/2017 1105   Sepsis Labs: Invalid input(s): PROCALCITONIN,  LACTICIDVEN  Recent Results (from the past 240 hour(s))  Culture, blood (Routine x 2)     Status: Abnormal   Collection Time: 11/04/17 11:06 AM  Result Value Ref Range Status   Specimen Description BLOOD BLOOD RIGHT FOREARM  Final   Special Requests   Final    BOTTLES DRAWN AEROBIC AND ANAEROBIC Blood Culture adequate volume   Culture  Setup Time   Final    GRAM POSITIVE COCCI IN BOTH AEROBIC AND ANAEROBIC BOTTLES CRITICAL RESULT CALLED TO, READ BACK BY AND VERIFIED WITH: Denton Brick [facility] 11/05/17 0026 JDW Performed at [facility], [place] [address] [place], [place] [address]    Culture STAPHYLOCOCCUS AUREUS (A)  Final   Report Status 11/06/2017 FINAL  Final   Organism ID, Bacteria STAPHYLOCOCCUS AUREUS  Final      Susceptibility   Staphylococcus aureus - MIC*    CIPROFLOXACIN <=0.5 SENSITIVE Sensitive     ERYTHROMYCIN >=8 RESISTANT Resistant     GENTAMICIN <=  0.5 SENSITIVE Sensitive     OXACILLIN 0.5 SENSITIVE Sensitive     TETRACYCLINE <=1 SENSITIVE Sensitive     VANCOMYCIN 1 SENSITIVE Sensitive     TRIMETH/SULFA <=10 SENSITIVE Sensitive     CLINDAMYCIN RESISTANT Resistant     RIFAMPIN <=0.5 SENSITIVE Sensitive     Inducible Clindamycin POSITIVE Resistant     * STAPHYLOCOCCUS AUREUS  Blood Culture ID Panel (Reflexed)     Status: Abnormal   Collection Time: 11/04/17 11:06 AM  Result Value Ref Range Status   Enterococcus species NOT DETECTED NOT DETECTED Final   Listeria monocytogenes NOT DETECTED NOT DETECTED Final   Staphylococcus species DETECTED (A) NOT DETECTED Final    Comment: CRITICAL RESULT CALLED TO, READ BACK BY AND VERIFIED WITH: G ABBOTT PHARMD 11/05/17 0026 JDW    Staphylococcus aureus DETECTED (A) NOT DETECTED Final    Comment: Methicillin (oxacillin) susceptible Staphylococcus aureus (MSSA). Preferred therapy is anti staphylococcal beta lactam antibiotic (Cefazolin or Nafcillin), unless clinically contraindicated. CRITICAL RESULT CALLED TO, READ BACK BY AND  VERIFIED WITH: G ABBOTT PHARMD 11/05/17 0026 JDW    Methicillin resistance NOT DETECTED NOT DETECTED Final   Streptococcus species NOT DETECTED NOT DETECTED Final   Streptococcus agalactiae NOT DETECTED NOT DETECTED Final   Streptococcus pneumoniae NOT DETECTED NOT DETECTED Final   Streptococcus pyogenes NOT DETECTED NOT DETECTED Final   Acinetobacter baumannii NOT DETECTED NOT DETECTED Final   Enterobacteriaceae species NOT DETECTED NOT DETECTED Final   Enterobacter cloacae complex NOT DETECTED NOT DETECTED Final   Escherichia coli NOT DETECTED NOT DETECTED Final   Klebsiella oxytoca NOT DETECTED NOT DETECTED Final   Klebsiella pneumoniae NOT DETECTED NOT DETECTED Final   Proteus species NOT DETECTED NOT DETECTED Final   Serratia marcescens NOT DETECTED NOT DETECTED Final   Haemophilus influenzae NOT DETECTED NOT DETECTED Final   Neisseria meningitidis NOT DETECTED NOT DETECTED Final   Pseudomonas aeruginosa NOT DETECTED NOT DETECTED Final   Candida albicans NOT DETECTED NOT DETECTED Final   Candida glabrata NOT DETECTED NOT DETECTED Final   Candida krusei NOT DETECTED NOT DETECTED Final   Candida parapsilosis NOT DETECTED NOT DETECTED Final   Candida tropicalis NOT DETECTED NOT DETECTED Final    Comment: Performed at [facility], [employer]. [address] [place], [place] [address]  Culture, blood (Routine x 2)     Status: Abnormal   Collection Time: 11/04/17 11:08 AM  Result Value Ref Range Status   Specimen Description BLOOD RIGHT ANTECUBITAL  Final   Special Requests   Final    BOTTLES DRAWN AEROBIC AND ANAEROBIC Blood Culture adequate volume   Culture  Setup Time   Final    IN BOTH AEROBIC AND ANAEROBIC BOTTLES GRAM POSITIVE COCCI CRITICAL RESULT CALLED TO, READ BACK BY AND VERIFIED WITH: Denton Brick [facility] 11/05/17 0026 JDW Performed at [facility], [place] [address], [place], [place] [address]    Culture (A)  Final    STAPHYLOCOCCUS AUREUS SUSCEPTIBILITIES PERFORMED ON  PREVIOUS CULTURE WITHIN THE LAST 5 DAYS.    Report Status 11/07/2017 FINAL  Final  Urine culture     Status: Abnormal   Collection Time: 11/04/17 11:09 AM  Result Value Ref Range Status   Specimen Description URINE, CATHETERIZED  Final   Special Requests   Final    NONE Performed at [facility], [address], [place], [place] [address]    Culture 10,000 COLONIES/mL PSEUDOMONAS AERUGINOSA (A)  Final   Report Status  11/06/2017 FINAL  Final   Organism ID, Bacteria PSEUDOMONAS AERUGINOSA (A)  Final      Susceptibility   Pseudomonas aeruginosa - MIC*    CEFTAZIDIME 4 SENSITIVE Sensitive     CIPROFLOXACIN <=0.25 SENSITIVE Sensitive     GENTAMICIN <=1 SENSITIVE Sensitive     IMIPENEM 2 SENSITIVE Sensitive     CEFEPIME 4 SENSITIVE Sensitive     * 10,000 COLONIES/mL PSEUDOMONAS AERUGINOSA  Aerobic Culture (superficial specimen)     Status: None (Preliminary result)   Collection Time: 11/05/17  1:24 PM  Result Value Ref Range Status   Specimen Description WOUND SCALP  Final   Special Requests NONE  Final   Gram Stain NO WBC SEEN FEW GRAM POSITIVE COCCI IN PAIRS   Final   Culture   Final    MODERATE STAPHYLOCOCCUS AUREUS SUSCEPTIBILITIES TO FOLLOW Performed at [facility], [place] [address] [place], [place] [address]    Report Status PENDING  Incomplete      Radiology Studies: No results found.   Scheduled Meds: . allopurinol  300 mg Oral Daily  . aspirin  81 mg Oral Daily  . clopidogrel  75 mg Oral Q breakfast  . colchicine  0.6 mg Oral Daily  . docusate sodium  100 mg Oral BID  . ferrous sulfate  325 mg Oral Q breakfast  . hydrALAZINE  50 mg Oral Q8H  . insulin aspart  0-15 Units Subcutaneous TID WC  . insulin aspart  0-5 Units Subcutaneous QHS  . metoprolol tartrate  12.5 mg Oral BID  . rosuvastatin  10 mg Oral Daily   Continuous Infusions: .  ceFAZolin (ANCEF) IV 2 g (11/07/17 0350)    Collier Salina, PA-S [facility] Pager [phone]  If  7PM-7AM, please contact night-coverage [email or web address] Password [email or web address] 11/07/2017, 11:28 AM

## 2017-11-07 NOTE — Progress Notes (Signed)
[facility] for Infectious Disease  Date of Admission:  11/04/2017   Total days of antibiotics 3        Day 3 cefazolin          Patient ID: Carrie Monroe is a 82 y.o. female with  Principal Problem:   MSSA bacteremia Active Problems:   Wound infection after surgery   S/P TAVR (transcatheter aortic valve replacement)   Diabetes mellitus type 2 in obese [facility])   Essential hypertension   Chronic renal insufficiency, stage III (moderate) (HCC)   Acute respiratory failure with hypoxia (HCC)   Chronic diastolic CHF (congestive heart failure) (HCC)   Anemia   . allopurinol  300 mg Oral Daily  . aspirin  81 mg Oral Daily  . clopidogrel  75 mg Oral Q breakfast  . colchicine  0.6 mg Oral Daily  . docusate sodium  100 mg Oral BID  . ferrous sulfate  325 mg Oral Q breakfast  . hydrALAZINE  50 mg Oral Q8H  . insulin aspart  0-15 Units Subcutaneous TID WC  . insulin aspart  0-5 Units Subcutaneous QHS  . metoprolol tartrate  12.5 mg Oral BID  . rosuvastatin  10 mg Oral Daily    SUBJECTIVE: Feeling better. TTE without mention of endocarditis. No fevers or other complaints.   Allergies  Allergen Reactions  . Oxycodone Other (See Comments)    Hallunication    OBJECTIVE: Vitals:   11/06/17 0434 11/06/17 1412 11/06/17 2143 11/07/17 0610  BP:  (!) 155/59 (!) 157/73 136/68  Pulse: (!) 48 65 75 66  Resp:  18 16 17   Temp:   98.4 F (36.9 C) 98.4 F (36.9 C)  TempSrc:   Oral Oral  SpO2: 97% 96% 94% 97%  Weight:      Height:       Body mass index is 43.35 kg/m.  Physical Exam  Constitutional: She is oriented to person, place, and time. She appears well-developed and well-nourished.  Resting comfortably in bed. Smiling.   HENT:  Mouth/Throat: Mucous membranes are normal. No oral lesions. Normal dentition. No dental abscesses. No oropharyngeal exudate.  Scalp is open and packed. Bloody drainage. Much less swelling.   Cardiovascular: Normal rate, regular  rhythm and normal heart sounds.  No murmur heard. Pulmonary/Chest: Effort normal and breath sounds normal.  Abdominal: Soft. She exhibits no distension. There is no tenderness.  Lymphadenopathy:    She has no cervical adenopathy.  Neurological: She is alert and oriented to person, place, and time.  Skin: Skin is warm and dry. No rash noted.  Psychiatric: She has a normal mood and affect. Judgment normal.    Lab Results Lab Results  Component Value Date   WBC 4.7 11/06/2017   HGB 10.1 (L) 11/06/2017   HCT 31.8 (L) 11/06/2017   MCV 98.5 11/06/2017   PLT 95 (L) 11/06/2017    Lab Results  Component Value Date   CREATININE 1.04 (H) 11/06/2017   BUN 27 (H) 11/06/2017   NA 139 11/06/2017   K 3.9 11/06/2017   CL 109 11/06/2017   CO2 22 11/06/2017    Lab Results  Component Value Date   ALT 18 11/04/2017   AST 22 11/04/2017   ALKPHOS 105 11/04/2017   BILITOT 1.1 11/04/2017     Microbiology: BCx 11/04/17 >> 2/2 MSSA (R-clindamycin, erythromycin)  BCx 11/05/17 >> NG Scalp Culture 6/11 >> staph aureus    ASSESSMENT: 82 y.o. 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 Found to have MSSA bacteremia in 2/2 cultures and has been narrowed to cefazolin. Fevers have resolved x 24h. No other signs of metastatic infection. Possible sources for bacteremia could be scalp infection (also with gram positive cocci in pairs on gram stain and apparently she had a lot of bleeding post op) or alternatively her prosthetic TAVR valve. TTE is negative for endocarditis --> would recommend TEE evaluation to assess for seeding of prosthetic valve.   PLAN: 1. MSSA Bacteremia = Needs TEE - duration pending results. Continue Cefazolin. OK to place PICC line Friday 6/14 if cultures remain negative.   2. Surgical Site Infection = appreciate surgery draining site. Needs outpatient FU with dermatology    3. Medication Monitoring = wbc normal-continue to watch. Platelets  low - seems as this is chronic (133k in 03/2017) and likely exacerbated by sepsis.       [facility] Staphylococcus aureus bacteremia   Staphylococcus aureus bacteremia (SAB) is associated with a high rate of complications and mortality.  Specific aspects of clinical management are critical to optimizing the outcome of patients with SAB.  Therefore, the [facility] Health Antimicrobial Management Team [facility]) has initiated an intervention aimed at improving the management of SAB at [facility].  To do so, Infectious Diseases physicians are providing an evidence-based consult for the management of all patients with SAB.     Yes No Comments  Perform follow-up blood cultures (even if the patient is afebrile) to ensure clearance of bacteremia [x]  []  6/11 NG  Remove vascular catheter and obtain follow-up blood cultures after the removal of the catheter []  [x]  na  Perform echocardiography to evaluate for endocarditis (transthoracic ECHO is 40-50% sensitive, TEE is > 90% sensitive) []  []  TTE negative --> TEE recommended with PV  Consult electrophysiologist to evaluate implanted cardiac device (pacemaker, ICD) []  [x]    Ensure source control [x]  []  Scalp properly drained   Investigate for "metastatic" sites of infection [x]  []  TEE pending. ROS otherwise negative.  Change antibiotic therapy to cefazolin  [x]  []  Beta-lactam antibiotics are preferred for MSSA due to higher cure rates.   If on Vancomycin, goal trough should be 15 - 20 mcg/mL  Estimated duration of IV antibiotic therapy:   []  []  Consult case management for probably prolonged outpatient IV antibiotic therapy     Janene Madeira, MSN, NP-C [facility] for Infectious [place] Cell: [phone] Pager: [phone]  11/07/2017  11:00 AM

## 2017-11-07 NOTE — Progress Notes (Signed)
Patient ID: Carrie Monroe, female   DOB: [birth year], 82 y.o.   MRN: [id number]       Subjective: Pt feeling better.  Upset that night nurse put tape on her head when she asked her not to and it gave her a headache?  Otherwise no new complaints.  Objective: Vital signs in last 24 hours: Temp:  [98.4 F (36.9 C)] 98.4 F (36.9 C) (06/13 0610) Pulse Rate:  [65-75] 66 (06/13 0610) Resp:  [16-18] 17 (06/13 0610) BP: (136-157)/(59-73) 136/68 (06/13 0610) SpO2:  [94 %-97 %] 97 % (06/13 0610) Last BM Date: 11/06/17  Intake/Output from previous day: 06/12 0701 - 06/13 0700 In: 240 [IV Piggyback:240] Out: -  Intake/Output this shift: No intake/output data recorded.  PE: Scalp: erythema resolved.  No further purulent drainage from scalp wound.  Packing in place.  Lab Results:  Recent Labs    11/06/17 0615 11/06/17 1400  WBC 4.7 4.7  HGB 9.3* 10.1*  HCT 29.1* 31.8*  PLT 75* 95*   BMET Recent Labs    11/05/17 0534 11/06/17 1400  NA 134* 139  K 4.2 3.9  CL 106 109  CO2 21* 22  GLUCOSE 155* 162*  BUN 28* 27*  CREATININE 1.34* 1.04*  CALCIUM 8.5* 8.9   PT/INR Recent Labs    11/04/17 1105  LABPROT 15.1  INR 1.20   CMP     Component Value Date/Time   NA 139 11/06/2017 1400   NA 141 06/11/2017 1623   NA 141 02/23/2015 1029   K 3.9 11/06/2017 1400   K 4.3 02/23/2015 1029   CL 109 11/06/2017 1400   CL 111 (H) 08/28/2012 1224   CO2 22 11/06/2017 1400   CO2 26 02/23/2015 1029   GLUCOSE 162 (H) 11/06/2017 1400   GLUCOSE 193 (H) 02/23/2015 1029   GLUCOSE 103 (H) 08/28/2012 1224   BUN 27 (H) 11/06/2017 1400   BUN 24 06/11/2017 1623   BUN 20.2 02/23/2015 1029   CREATININE 1.04 (H) 11/06/2017 1400   CREATININE 1.3 (H) 02/23/2015 1029   CALCIUM 8.9 11/06/2017 1400   CALCIUM 9.5 02/23/2015 1029   PROT 6.1 (L) 11/04/2017 1105   PROT 6.6 02/23/2015 1029   ALBUMIN 3.0 (L) 11/04/2017 1105   ALBUMIN 3.4 (L) 02/23/2015 1029   AST 22 11/04/2017 1105   AST 15  02/23/2015 1029   ALT 18 11/04/2017 1105   ALT 10 02/23/2015 1029   ALKPHOS 105 11/04/2017 1105   ALKPHOS 76 02/23/2015 1029   BILITOT 1.1 11/04/2017 1105   BILITOT 0.56 02/23/2015 1029   GFRNONAA 50 (L) 11/06/2017 1400   GFRAA 58 (L) 11/06/2017 1400   Lipase  No results found for: LIPASE     Studies/Results: No results found.  Anti-infectives: Anti-infectives (From admission, onward)   Start     Dose/Rate Route Frequency Ordered Stop   11/05/17 0200  ceFAZolin (ANCEF) IVPB 2g/100 mL premix     2 g 200 mL/hr over 30 Minutes Intravenous Every 8 hours 11/05/17 0149     11/04/17 1330  vancomycin (VANCOCIN) 1,250 mg in sodium chloride 0.9 % 250 mL IVPB  Status:  Discontinued     1,250 mg 166.7 mL/hr over 90 Minutes Intravenous Every 24 hours 11/04/17 1311 11/05/17 0149       Assessment/Plan MSSA bacteremia secondary to scalp wound infection, s/p excision of squamous cell carcinoma by dermatology as outpatient -wound opened, culture show staph aureus -NS wet-to-dry dressing changes BID -will need to follow up  with her dermatologist as an outpatient for further wound care -abx therapy per primary service  QLR:JPVGK healthy VTE: SCD's, Plavix and aspirin ID: vancomycin 06/10-06/11    Ancef 06/11>> Follow up: her dermatologist  DISPO: no further surgical needs.  We will sign off.    LOS: 3 days    Henreitta Cea , [facility] 11/07/2017, 8:42 AM Pager: [phone]

## 2017-11-07 NOTE — Progress Notes (Signed)
PROGRESS NOTE  Carrie Monroe NFA:[id number] DOB: [birth year] DOA: 11/04/2017 PCP: Marton Redwood, MD   LOS: 3 days   Brief Narrative / Interim history: 82 year old female with history of coronary artery disease with prior stents, aortic stenosis status post TAVR in September 2018, diet-controlled diabetes mellitus, hypertension, chronic kidney disease stage III with baseline creatinine around 4.6-9.6, chronic diastolic CHF who recently had squamous cell carcinoma of the scalp removed in the outpatient setting presents to the hospital with sepsis in the setting of cellulitis due to scalp infection, she was also found to have MSSA bacteremia.General surgery consulted due to scalp wound, infectious disease consulted due to her bacteremia.  Assessment & Plan: Principal Problem:   MSSA bacteremia Active Problems:   Diabetes mellitus type 2 in obese [facility])   Essential hypertension   Chronic renal insufficiency, stage III (moderate) (HCC)   S/P TAVR (transcatheter aortic valve replacement)   Acute respiratory failure with hypoxia (HCC)   Chronic diastolic CHF (congestive heart failure) (HCC)   Anemia   Wound infection after surgery   Severe sepsis due to MSSA bacteremia in the setting of infected scalp incision -Met SIRS criteria, plus a source, as well as showed signs of en dorgan perfusion with encephalopathy -Remains clinically stable, sepsis physiology resolved -2D echo on 6/12- for vegetations, patient to undergo a TEE today.  Continue cefazolin, duration of antibiotics based on TEE results -Surveillance cultures have remained negative, likely put a PICC line in 1 to 2 days if they continue to stay negative  Aortic stenosis status post TAVR -Stable, TEE today  Chronic diastolic CHF -Holding fluids, she is eating well -She appears euvolemic -2D echo yesterday with slightly depressed EF 45 to 50%, however a TEE will better characterize today  Chronic kidney disease stage  III -Creatinine appears at baseline  Thrombocytopenia -Recent prior values were within normal limits however she did have thrombocytopenia in the past so suspect a chronic component potentially worsened by her sepsis -No bleeding, continue to monitor, platelets improving  Coronary artery disease -No chest pain  Diet-controlled type 2 diabetes mellitus -Continue sliding scale  Hypertension -Continue Lopressor pressure stable today  Iron deficiencyanemia/in the setting of chronic kidney disease -Continue iron supplementations, hemoglobin stable   DVT prophylaxis: SCDs Code Status: DNR Family Communication: No family at bedside Disposition Plan: Home 2 to 3 days pending negative surveillance cultures and PICC line placement  Consultants:   Infectious disease  General surgery  Procedures:   2D echo  TEE  Antimicrobials:  Cefazolin   Subjective: - no chest pain, shortness of breath, no abdominal pain, nausea or vomiting.   Objective: Vitals:   11/07/17 1435 11/07/17 1440 11/07/17 1446 11/07/17 1456  BP: (!) 180/50 (!) 179/51 (!) 174/41 (!) 176/38  Pulse: 64 75 66 62  Resp: _0 Temp:   98.7 F (37.1 C)   TempSrc:   Oral   SpO2: 100% 97% 94% 95%  Weight:      Height:        Intake/Output Summary (Last 24 hours) at 11/07/2017 1503 Last data filed at 11/07/2017 1009 Gross per 24 hour  Intake 90 ml  Output -  Net 90 ml   Filed Weights   11/04/17 1048 11/04/17 1736  Weight: 104.3 kg (230 lb) 111 kg (244 lb 11.4 oz)    Examination:  Constitutional: NAD Eyes: lids and conjunctivae normal ENMT: Mucous membranes are moist. Neck: normal, supple Respiratory: clear to auscultation bilaterally, no wheezing, no  crackles. Cardiovascular: Regular rate and rhythm, no murmurs / rubs / gallops. Trace LE edema. 2+ pedal pulses.  Abdomen: no tenderness. Bowel sounds positive.  Skin: no rashes, lesions, ulcers. No induration Neurologic: non focal     Data Reviewed: I have independently reviewed following labs and imaging studies   CBC: Recent Labs  Lab 11/04/17 1105 11/05/17 0534 11/06/17 0615 11/06/17 1400  WBC 10.8* 5.4 4.7 4.7  NEUTROABS 9.4*  --   --   --   HGB 10.5* 9.9* 9.3* 10.1*  HCT 32.3* 30.9* 29.1* 31.8*  MCV 98.2 98.7 99.0 98.5  PLT 93* 82* 75* 95*   Basic Metabolic Panel: Recent Labs  Lab 11/04/17 1105 11/05/17 0534 11/06/17 1400  NA 137 134* 139  K 3.8 4.2 3.9  CL 107 106 109  CO2 20* 21* 22  GLUCOSE 207* 155* 162*  BUN 28* 28* 27*  CREATININE 1.36* 1.34* 1.04*  CALCIUM 8.9 8.5* 8.9   GFR: Estimated Creatinine Clearance: 52.5 mL/min (A) (by C-G formula based on SCr of 1.04 mg/dL (H)). Liver Function Tests: Recent Labs  Lab 11/04/17 1105  AST 22  ALT 18  ALKPHOS 105  BILITOT 1.1  PROT 6.1*  ALBUMIN 3.0*   No results for input(s): LIPASE, AMYLASE in the last 168 hours. No results for input(s): AMMONIA in the last 168 hours. Coagulation Profile: Recent Labs  Lab 11/04/17 1105  INR 1.20   Cardiac Enzymes: No results for input(s): CKTOTAL, CKMB, CKMBINDEX, TROPONINI in the last 168 hours. BNP (last 3 results) No results for input(s): PROBNP in the last 8760 hours. HbA1C: No results for input(s): HGBA1C in the last 72 hours. CBG: Recent Labs  Lab 11/06/17 1240 11/06/17 1705 11/06/17 2144 11/07/17 0759 11/07/17 1208  GLUCAP 168* 211* 197* 188* 146*   Lipid Profile: No results for input(s): CHOL, HDL, LDLCALC, TRIG, CHOLHDL, LDLDIRECT in the last 72 hours. Thyroid Function Tests: No results for input(s): TSH, T4TOTAL, FREET4, T3FREE, THYROIDAB in the last 72 hours. Anemia Panel: No results for input(s): VITAMINB12, FOLATE, FERRITIN, TIBC, IRON, RETICCTPCT in the last 72 hours. Urine analysis:    Component Value Date/Time   COLORURINE YELLOW 11/04/2017 1105   APPEARANCEUR HAZY (A) 11/04/2017 1105   LABSPEC 1.018 11/04/2017 1105   PHURINE 5.0 11/04/2017 1105   GLUCOSEU  NEGATIVE 11/04/2017 1105   HGBUR MODERATE (A) 11/04/2017 1105   BILIRUBINUR NEGATIVE 11/04/2017 1105   KETONESUR 20 (A) 11/04/2017 1105   PROTEINUR >=300 (A) 11/04/2017 1105   UROBILINOGEN 1.0 07/28/2013 1305   NITRITE NEGATIVE 11/04/2017 1105   LEUKOCYTESUR TRACE (A) 11/04/2017 1105   Sepsis Labs: Invalid input(s): PROCALCITONIN, LACTICIDVEN  Recent Results (from the past 240 hour(s))  Culture, blood (Routine x 2)     Status: Abnormal   Collection Time: 11/04/17 11:06 AM  Result Value Ref Range Status   Specimen Description BLOOD BLOOD RIGHT FOREARM  Final   Special Requests   Final    BOTTLES DRAWN AEROBIC AND ANAEROBIC Blood Culture adequate volume   Culture  Setup Time   Final    GRAM POSITIVE COCCI IN BOTH AEROBIC AND ANAEROBIC BOTTLES CRITICAL RESULT CALLED TO, READ BACK BY AND VERIFIED WITH: Denton Brick [facility] 11/05/17 0026 JDW Performed at [facility], [place] [address] [place], [place] [address]    Culture STAPHYLOCOCCUS AUREUS (A)  Final   Report Status 11/06/2017 FINAL  Final   Organism ID, Bacteria STAPHYLOCOCCUS AUREUS  Final      Susceptibility  Staphylococcus aureus - MIC*    CIPROFLOXACIN <=0.5 SENSITIVE Sensitive     ERYTHROMYCIN >=8 RESISTANT Resistant     GENTAMICIN <=0.5 SENSITIVE Sensitive     OXACILLIN 0.5 SENSITIVE Sensitive     TETRACYCLINE <=1 SENSITIVE Sensitive     VANCOMYCIN 1 SENSITIVE Sensitive     TRIMETH/SULFA <=10 SENSITIVE Sensitive     CLINDAMYCIN RESISTANT Resistant     RIFAMPIN <=0.5 SENSITIVE Sensitive     Inducible Clindamycin POSITIVE Resistant     * STAPHYLOCOCCUS AUREUS  Blood Culture ID Panel (Reflexed)     Status: Abnormal   Collection Time: 11/04/17 11:06 AM  Result Value Ref Range Status   Enterococcus species NOT DETECTED NOT DETECTED Final   Listeria monocytogenes NOT DETECTED NOT DETECTED Final   Staphylococcus species DETECTED (A) NOT DETECTED Final    Comment: CRITICAL RESULT CALLED TO, READ BACK BY AND VERIFIED  WITH: G ABBOTT PHARMD 11/05/17 0026 JDW    Staphylococcus aureus DETECTED (A) NOT DETECTED Final    Comment: Methicillin (oxacillin) susceptible Staphylococcus aureus (MSSA). Preferred therapy is anti staphylococcal beta lactam antibiotic (Cefazolin or Nafcillin), unless clinically contraindicated. CRITICAL RESULT CALLED TO, READ BACK BY AND VERIFIED WITH: G ABBOTT PHARMD 11/05/17 0026 JDW    Methicillin resistance NOT DETECTED NOT DETECTED Final   Streptococcus species NOT DETECTED NOT DETECTED Final   Streptococcus agalactiae NOT DETECTED NOT DETECTED Final   Streptococcus pneumoniae NOT DETECTED NOT DETECTED Final   Streptococcus pyogenes NOT DETECTED NOT DETECTED Final   Acinetobacter baumannii NOT DETECTED NOT DETECTED Final   Enterobacteriaceae species NOT DETECTED NOT DETECTED Final   Enterobacter cloacae complex NOT DETECTED NOT DETECTED Final   Escherichia coli NOT DETECTED NOT DETECTED Final   Klebsiella oxytoca NOT DETECTED NOT DETECTED Final   Klebsiella pneumoniae NOT DETECTED NOT DETECTED Final   Proteus species NOT DETECTED NOT DETECTED Final   Serratia marcescens NOT DETECTED NOT DETECTED Final   Haemophilus influenzae NOT DETECTED NOT DETECTED Final   Neisseria meningitidis NOT DETECTED NOT DETECTED Final   Pseudomonas aeruginosa NOT DETECTED NOT DETECTED Final   Candida albicans NOT DETECTED NOT DETECTED Final   Candida glabrata NOT DETECTED NOT DETECTED Final   Candida krusei NOT DETECTED NOT DETECTED Final   Candida parapsilosis NOT DETECTED NOT DETECTED Final   Candida tropicalis NOT DETECTED NOT DETECTED Final    Comment: Performed at [facility], [place]. [address], [place], [place] [address]  Culture, blood (Routine x 2)     Status: Abnormal   Collection Time: 11/04/17 11:08 AM  Result Value Ref Range Status   Specimen Description BLOOD RIGHT ANTECUBITAL  Final   Special Requests   Final    BOTTLES DRAWN AEROBIC AND ANAEROBIC Blood Culture adequate volume    Culture  Setup Time   Final    IN BOTH AEROBIC AND ANAEROBIC BOTTLES GRAM POSITIVE COCCI CRITICAL RESULT CALLED TO, READ BACK BY AND VERIFIED WITH: Denton Brick [facility] 11/05/17 0026 JDW Performed at [facility], [place] [address], [place], [place] [address]    Culture (A)  Final    STAPHYLOCOCCUS AUREUS SUSCEPTIBILITIES PERFORMED ON PREVIOUS CULTURE WITHIN THE LAST 5 DAYS.    Report Status 11/07/2017 FINAL  Final  Urine culture     Status: Abnormal   Collection Time: 11/04/17 11:09 AM  Result Value Ref Range Status   Specimen Description URINE, CATHETERIZED  Final   Special Requests   Final    NONE Performed at [facility], [place] [address] [place], [place] [address]    Culture 10,000 COLONIES/mL PSEUDOMONAS AERUGINOSA (A)  Final   Report Status 11/06/2017 FINAL  Final   Organism ID, Bacteria PSEUDOMONAS AERUGINOSA (A)  Final      Susceptibility   Pseudomonas aeruginosa - MIC*    CEFTAZIDIME 4 SENSITIVE Sensitive     CIPROFLOXACIN <=0.25 SENSITIVE Sensitive     GENTAMICIN <=1 SENSITIVE Sensitive     IMIPENEM 2 SENSITIVE Sensitive     CEFEPIME 4 SENSITIVE Sensitive     * 10,000 COLONIES/mL PSEUDOMONAS AERUGINOSA  Aerobic Culture (superficial specimen)     Status: None (Preliminary result)   Collection Time: 11/05/17  1:24 PM  Result Value Ref Range Status   Specimen Description WOUND SCALP  Final   Special Requests NONE  Final   Gram Stain   Final    NO WBC SEEN FEW GRAM POSITIVE COCCI IN PAIRS Performed at [facility], [address], [place], [place] [address]    Culture MODERATE STAPHYLOCOCCUS AUREUS  Final   Report Status PENDING  Incomplete   Organism ID, Bacteria STAPHYLOCOCCUS AUREUS  Final      Susceptibility   Staphylococcus aureus - MIC*    CIPROFLOXACIN <=0.5 SENSITIVE Sensitive     ERYTHROMYCIN RESISTANT Resistant     GENTAMICIN <=0.5 SENSITIVE Sensitive     OXACILLIN 0.5 SENSITIVE Sensitive     TETRACYCLINE <=1 SENSITIVE Sensitive      VANCOMYCIN 1 SENSITIVE Sensitive     TRIMETH/SULFA <=10 SENSITIVE Sensitive     CLINDAMYCIN RESISTANT Resistant     RIFAMPIN <=0.5 SENSITIVE Sensitive     Inducible Clindamycin POSITIVE Resistant     * MODERATE STAPHYLOCOCCUS AUREUS  Culture, blood (routine x 2)     Status: None (Preliminary result)   Collection Time: 11/06/17  6:09 AM  Result Value Ref Range Status   Specimen Description BLOOD LEFT HAND  Final   Special Requests   Final    BOTTLES DRAWN AEROBIC AND ANAEROBIC Blood Culture adequate volume   Culture   Final    NO GROWTH 1 DAY Performed at [facility], [address], [place], [place] [address]    Report Status PENDING  Incomplete  Culture, blood (routine x 2)     Status: None (Preliminary result)   Collection Time: 11/06/17 12:43 PM  Result Value Ref Range Status   Specimen Description BLOOD RIGHT HAND  Final   Special Requests   Final    BOTTLES DRAWN AEROBIC ONLY Blood Culture adequate volume   Culture   Final    NO GROWTH 1 DAY Performed at [facility], [place] [address], [place], [place] [address]    Report Status PENDING  Incomplete      Radiology Studies: [place] Ekg Site Rite  Result Date: 11/07/2017 If Site Rite image not attached, placement could not be confirmed due to current cardiac rhythm.    Scheduled Meds: . [MAR Hold] allopurinol  300 mg Oral Daily  . [MAR Hold] aspirin  81 mg Oral Daily  . [MAR Hold] clopidogrel  75 mg Oral Q breakfast  . [MAR Hold] colchicine  0.6 mg Oral Daily  . [MAR Hold] docusate sodium  100 mg Oral BID  . [MAR Hold] ferrous sulfate  325 mg Oral Q breakfast  . [MAR Hold] hydrALAZINE  50 mg Oral Q8H  . [MAR Hold] insulin aspart  0-15 Units Subcutaneous TID WC  . [MAR Hold] insulin aspart  0-5 Units Subcutaneous QHS  . [MAR Hold] metoprolol tartrate  12.5 mg Oral BID  . [MAR Hold] rosuvastatin  10 mg Oral Daily   Continuous Infusions: . [MAR Hold]  ceFAZolin (ANCEF) IV 2 g (11/07/17 1000)  . lactated  ringers 20 mL/hr at 11/07/17 [place], MD, PhD [facility] Pager [phone]  If 7PM-7AM, please contact night-coverage [email or web address] Password [facility] 11/07/2017, 3:03 PM

## 2017-11-08 DIAGNOSIS — Z954 Presence of other heart-valve replacement: Secondary | ICD-10-CM

## 2017-11-08 LAB — BASIC METABOLIC PANEL
Anion gap: 5 (ref 5–15)
BUN: 22 mg/dL — AB (ref 6–20)
CHLORIDE: 106 mmol/L (ref 101–111)
CO2: 23 mmol/L (ref 22–32)
Calcium: 8.6 mg/dL — ABNORMAL LOW (ref 8.9–10.3)
Creatinine, Ser: 1.04 mg/dL — ABNORMAL HIGH (ref 0.44–1.00)
GFR calc Af Amer: 58 mL/min — ABNORMAL LOW (ref >60)
GFR calc non Af Amer: 50 mL/min — ABNORMAL LOW (ref >60)
GLUCOSE: 154 mg/dL — AB (ref 65–99)
POTASSIUM: 4.2 mmol/L (ref 3.5–5.1)
Sodium: 134 mmol/L — ABNORMAL LOW (ref 135–145)

## 2017-11-08 LAB — GLUCOSE, CAPILLARY
GLUCOSE-CAPILLARY: 154 mg/dL — AB (ref 65–99)
Glucose-Capillary: 151 mg/dL — ABNORMAL HIGH (ref 65–99)
Glucose-Capillary: 149 mg/dL — ABNORMAL HIGH (ref 65–99)
Glucose-Capillary: 179 mg/dL — ABNORMAL HIGH (ref 65–99)
Glucose-Capillary: 157 mg/dL — ABNORMAL HIGH (ref 65–99)

## 2017-11-08 LAB — AEROBIC CULTURE W GRAM STAIN (SUPERFICIAL SPECIMEN): Gram Stain: NONE SEEN

## 2017-11-08 LAB — CBC
HEMATOCRIT: 29.9 % — AB (ref 36.0–46.0)
HEMOGLOBIN: 9.3 g/dL — AB (ref 12.0–15.0)
MCH: 30.8 pg (ref 26.0–34.0)
MCHC: 31.1 g/dL (ref 30.0–36.0)
MCV: 99 fL (ref 78.0–100.0)
PLATELETS: 107 10*3/uL — AB (ref 150–400)
RBC: 3.02 MIL/uL — ABNORMAL LOW (ref 3.87–5.11)
RDW: 13.8 % (ref 11.5–15.5)
WBC: 5.1 10*3/uL (ref 4.0–10.5)

## 2017-11-08 LAB — AEROBIC CULTURE  (SUPERFICIAL SPECIMEN)

## 2017-11-08 MED ORDER — DAPTOMYCIN IV (FOR PTA / DISCHARGE USE ONLY): 800.0000 mg | INTRAVENOUS | 0 refills | Status: DC

## 2017-11-08 NOTE — Progress Notes (Signed)
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  Assessment & Plan: Principal Problem:   MSSA bacteremia Active Problems:   Diabetes mellitus type 2 in obese [facility])   Essential hypertension   Chronic renal insufficiency, stage III (moderate) (HCC)   S/P TAVR (transcatheter aortic valve replacement)   Acute respiratory failure with hypoxia (HCC)   Chronic diastolic CHF (congestive heart failure) (HCC)   Anemia   Wound infection after surgery   Severe sepsis due to MSSA bacteremia in the setting of infected scalp incision -Met SIRS criteria, plus a source, as well as showed signs of en dorgan perfusion with encephalopathy -Remains clinically stable, sepsis physiology resolved -2D echo on 6/12 negative for vegetations, TEE negative as well -Surveillance cultures have remained negative, PICC line pending -change to linezolid, cannot qualify for home health, stay in house until Sunday then antibiotics will be via short stay  Aortic stenosis status post TAVR -TEE unremarkable   Chronic diastolic CHF -Holding fluids, she is eating well -She appears euvolemic -TEE stable  Chronic kidney disease stage III -Creatinine appears at baseline  Thrombocytopenia -Recent prior values were within  normal limits however she did have thrombocytopenia in the past so suspect a chronic component potentially worsened by her sepsis -No bleeding, continue to monitor, platelets improving  Coronary artery disease -No chest pain  Diet-controlled type 2 diabetes mellitus -Continue sliding scale  Hypertension -Continue Lopressor pressure stable today  Iron deficiencyanemia/in the setting of chronic kidney disease -Continue iron supplementations, hemoglobin stable   DVT prophylaxis: SCDs Code Status: DNR Family Communication: No family at bedside Disposition Plan: Home sunday  Consultants:   Infectious disease  General surgery  Procedures:   2D echo  TEE  Antimicrobials:  Cefazolin   Subjective: - no chest pain, shortness of breath, no abdominal pain, nausea or vomiting.   Objective: Vitals:   11/07/17 1511 11/07/17 2120 11/08/17 0422 11/08/17 1459  BP: (!) 169/92 (!) 155/68 (!) 160/64 (!) 141/53  Pulse: 66 82 70 70  Resp: _0 Temp: 98.6 F (37 C) 98.2 F (36.8 C) 97.9 F (36.6 C) 97.9 F (36.6 C)  TempSrc: Oral Oral Oral Oral  SpO2: 96% 97% 96% 99%  Weight:      Height:        Intake/Output Summary (Last 24 hours) at 11/08/2017 1540 Last data filed at 11/08/2017 0500 Gross per 24 hour  Intake 3313.33 ml  Output -  Net 3313.33 ml   Filed Weights   11/04/17 1048 11/04/17 1736  Weight: 104.3 kg (230 lb) 111 kg (244 lb 11.4 oz)    Examination:  Constitutional: NAD Respiratory: CTA Cardiovascular: RRR   Data Reviewed: I have independently reviewed following labs and imaging studies   CBC: Recent Labs  Lab 11/04/17 1105 11/05/17 0534 11/06/17 0615 11/06/17 1400 11/08/17 0555  WBC 10.8* 5.4 4.7 4.7 5.1  NEUTROABS 9.4*  --   --   --   --   HGB 10.5* 9.9* 9.3* 10.1* 9.3*  HCT 32.3* 30.9* 29.1* 31.8* 29.9*  MCV 98.2 98.7 99.0 98.5 99.0  PLT 93* 82* 75* 95* 875*   Basic Metabolic Panel: Recent Labs  Lab 11/04/17 1105  11/05/17 0534 11/06/17 1400 11/08/17 0555  NA 137 134* 139 134*  K 3.8 4.2 3.9 4.2  CL 107 106 109 106  CO2 20* 21* 22 23  GLUCOSE 207* 155* 162* 154*  BUN 28* 28* 27* 22*  CREATININE 1.36* 1.34* 1.04* 1.04*  CALCIUM 8.9 8.5* 8.9 8.6*   GFR: Estimated Creatinine Clearance: 52.5 mL/min (A) (by C-G formula based on SCr of 1.04 mg/dL (H)). Liver Function Tests: Recent Labs  Lab 11/04/17 1105  AST 22  ALT 18  ALKPHOS 105  BILITOT 1.1  PROT 6.1*  ALBUMIN 3.0*   No results for input(s): LIPASE, AMYLASE in the last 168 hours. No results for input(s): AMMONIA in the last 168 hours. Coagulation Profile: Recent Labs  Lab 11/04/17 1105  INR 1.20   Cardiac Enzymes: No results for input(s): CKTOTAL, CKMB, CKMBINDEX, TROPONINI in the last 168 hours. BNP (last 3 results) No results for input(s): PROBNP in the last 8760 hours. HbA1C: No results for input(s): HGBA1C in the last 72 hours. CBG: Recent Labs  Lab 11/07/17 1635 11/07/17 2254 11/08/17 0432 11/08/17 0814 11/08/17 1206  GLUCAP 176* 148* 151* 149* 179*   Lipid Profile: No results for input(s): CHOL, HDL, LDLCALC, TRIG, CHOLHDL, LDLDIRECT in the last 72 hours. Thyroid Function Tests: No results for input(s): TSH, T4TOTAL, FREET4, T3FREE, THYROIDAB in the last 72 hours. Anemia Panel: No results for input(s): VITAMINB12, FOLATE, FERRITIN, TIBC, IRON, RETICCTPCT in the last 72 hours. Urine analysis:    Component Value Date/Time   COLORURINE YELLOW 11/04/2017 1105   APPEARANCEUR HAZY (A) 11/04/2017 1105   LABSPEC 1.018 11/04/2017 1105   PHURINE 5.0 11/04/2017 1105   GLUCOSEU NEGATIVE 11/04/2017 1105   HGBUR MODERATE (A) 11/04/2017 1105   BILIRUBINUR NEGATIVE 11/04/2017 1105   KETONESUR 20 (A) 11/04/2017 1105   PROTEINUR >=300 (A) 11/04/2017 1105   UROBILINOGEN 1.0 07/28/2013 1305   NITRITE NEGATIVE 11/04/2017 1105   LEUKOCYTESUR TRACE (A) 11/04/2017 1105   Sepsis Labs: Invalid input(s): PROCALCITONIN,  LACTICIDVEN  Recent Results (from the past 240 hour(s))  Culture, blood (Routine x 2)     Status: Abnormal   Collection Time: 11/04/17 11:06 AM  Result Value Ref Range Status   Specimen Description BLOOD BLOOD RIGHT FOREARM  Final   Special Requests   Final    BOTTLES DRAWN AEROBIC AND ANAEROBIC Blood Culture adequate volume   Culture  Setup Time   Final    GRAM POSITIVE COCCI IN BOTH AEROBIC AND ANAEROBIC BOTTLES CRITICAL RESULT CALLED TO, READ BACK BY AND VERIFIED WITH: D[facility] 11/05/17 0026 JDW Performed at [facility], [place] [address], [place], [place] [address]    Culture STAPHYLOCOCCUS AUREUS (A)  Final   Report Status 11/06/2017 FINAL  Final   Organism ID, Bacteria STAPHYLOCOCCUS AUREUS  Final      Susceptibility   Staphylococcus aureus - MIC*    CIPROFLOXACIN <=0.5 SENSITIVE Sensitive     ERYTHROMYCIN >=8 RESISTANT Resistant     GENTAMICIN <=0.5 SENSITIVE Sensitive     OXACILLIN 0.5 SENSITIVE Sensitive     TETRACYCLINE <=1 SENSITIVE Sensitive     VANCOMYCIN 1 SENSITIVE Sensitive  TRIMETH/SULFA <=10 SENSITIVE Sensitive     CLINDAMYCIN RESISTANT Resistant     RIFAMPIN <=0.5 SENSITIVE Sensitive     Inducible Clindamycin POSITIVE Resistant     * STAPHYLOCOCCUS AUREUS  Blood Culture ID Panel (Reflexed)     Status: Abnormal   Collection Time: 11/04/17 11:06 AM  Result Value Ref Range Status   Enterococcus species NOT DETECTED NOT DETECTED Final   Listeria monocytogenes NOT DETECTED NOT DETECTED Final   Staphylococcus species DETECTED (A) NOT DETECTED Final    Comment: CRITICAL RESULT CALLED TO, READ BACK BY AND VERIFIED WITH: G ABBOTT PHARMD 11/05/17 0026 JDW    Staphylococcus aureus DETECTED (A) NOT DETECTED Final    Comment: Methicillin (oxacillin) susceptible Staphylococcus aureus (MSSA). Preferred therapy is anti staphylococcal beta lactam antibiotic (Cefazolin or Nafcillin), unless clinically contraindicated. CRITICAL RESULT CALLED TO, READ BACK BY AND  VERIFIED WITH: G ABBOTT PHARMD 11/05/17 0026 JDW    Methicillin resistance NOT DETECTED NOT DETECTED Final   Streptococcus species NOT DETECTED NOT DETECTED Final   Streptococcus agalactiae NOT DETECTED NOT DETECTED Final   Streptococcus pneumoniae NOT DETECTED NOT DETECTED Final   Streptococcus pyogenes NOT DETECTED NOT DETECTED Final   Acinetobacter baumannii NOT DETECTED NOT DETECTED Final   Enterobacteriaceae species NOT DETECTED NOT DETECTED Final   Enterobacter cloacae complex NOT DETECTED NOT DETECTED Final   Escherichia coli NOT DETECTED NOT DETECTED Final   Klebsiella oxytoca NOT DETECTED NOT DETECTED Final   Klebsiella pneumoniae NOT DETECTED NOT DETECTED Final   Proteus species NOT DETECTED NOT DETECTED Final   Serratia marcescens NOT DETECTED NOT DETECTED Final   Haemophilus influenzae NOT DETECTED NOT DETECTED Final   Neisseria meningitidis NOT DETECTED NOT DETECTED Final   Pseudomonas aeruginosa NOT DETECTED NOT DETECTED Final   Candida albicans NOT DETECTED NOT DETECTED Final   Candida glabrata NOT DETECTED NOT DETECTED Final   Candida krusei NOT DETECTED NOT DETECTED Final   Candida parapsilosis NOT DETECTED NOT DETECTED Final   Candida tropicalis NOT DETECTED NOT DETECTED Final    Comment: Performed at [facility], [place]. [address] [place], [place] [address]  Culture, blood (Routine x 2)     Status: Abnormal   Collection Time: 11/04/17 11:08 AM  Result Value Ref Range Status   Specimen Description BLOOD RIGHT ANTECUBITAL  Final   Special Requests   Final    BOTTLES DRAWN AEROBIC AND ANAEROBIC Blood Culture adequate volume   Culture  Setup Time   Final    IN BOTH AEROBIC AND ANAEROBIC BOTTLES GRAM POSITIVE COCCI CRITICAL RESULT CALLED TO, READ BACK BY AND VERIFIED WITH: Denton [facility] 11/05/17 0026 JDW Performed at [facility], [place] [address] [place], [place] [address]    Culture (A)  Final    STAPHYLOCOCCUS AUREUS SUSCEPTIBILITIES PERFORMED ON  PREVIOUS CULTURE WITHIN THE LAST 5 DAYS.    Report Status 11/07/2017 FINAL  Final  Urine culture     Status: Abnormal   Collection Time: 11/04/17 11:09 AM  Result Value Ref Range Status   Specimen Description URINE, CATHETERIZED  Final   Special Requests   Final    NONE Performed at [facility], [address], [place], [place] [address]    Culture 10,000 COLONIES/mL PSEUDOMONAS AERUGINOSA (A)  Final   Report Status 11/06/2017 FINAL  Final   Organism ID, Bacteria PSEUDOMONAS AERUGINOSA (A)  Final      Susceptibility   Pseudomonas aeruginosa - MIC*    CEFTAZIDIME 4  SENSITIVE Sensitive     CIPROFLOXACIN <=0.25 SENSITIVE Sensitive     GENTAMICIN <=1 SENSITIVE Sensitive     IMIPENEM 2 SENSITIVE Sensitive     CEFEPIME 4 SENSITIVE Sensitive     * 10,000 COLONIES/mL PSEUDOMONAS AERUGINOSA  Aerobic Culture (superficial specimen)     Status: None   Collection Time: 11/05/17  1:24 PM  Result Value Ref Range Status   Specimen Description WOUND SCALP  Final   Special Requests NONE  Final   Gram Stain   Final    NO WBC SEEN FEW GRAM POSITIVE COCCI IN PAIRS Performed at [facility], [place] [address] [place], [place] [address]    Culture MODERATE STAPHYLOCOCCUS AUREUS  Final   Report Status 11/08/2017 FINAL  Final   Organism ID, Bacteria STAPHYLOCOCCUS AUREUS  Final      Susceptibility   Staphylococcus aureus - MIC*    CIPROFLOXACIN <=0.5 SENSITIVE Sensitive     ERYTHROMYCIN RESISTANT Resistant     GENTAMICIN <=0.5 SENSITIVE Sensitive     OXACILLIN 0.5 SENSITIVE Sensitive     TETRACYCLINE <=1 SENSITIVE Sensitive     VANCOMYCIN 1 SENSITIVE Sensitive     TRIMETH/SULFA <=10 SENSITIVE Sensitive     CLINDAMYCIN RESISTANT Resistant     RIFAMPIN <=0.5 SENSITIVE Sensitive     Inducible Clindamycin POSITIVE Resistant     * MODERATE STAPHYLOCOCCUS AUREUS  Culture, blood (routine x 2)     Status: None (Preliminary result)   Collection Time: 11/06/17  6:09 AM  Result Value Ref  Range Status   Specimen Description BLOOD LEFT HAND  Final   Special Requests   Final    BOTTLES DRAWN AEROBIC AND ANAEROBIC Blood Culture adequate volume   Culture   Final    NO GROWTH 2 DAYS Performed at [facility], [address], [place], [place] [address]    Report Status PENDING  Incomplete  Culture, blood (routine x 2)     Status: None (Preliminary result)   Collection Time: 11/06/17 12:43 PM  Result Value Ref Range Status   Specimen Description BLOOD RIGHT HAND  Final   Special Requests   Final    BOTTLES DRAWN AEROBIC ONLY Blood Culture adequate volume   Culture   Final    NO GROWTH 2 DAYS Performed at [facility], [place] [address] [place],  [address]    Report Status PENDING  Incomplete      Radiology Studies: [place] Ekg Site Rite  Result Date: 11/07/2017 If Site Rite image not attached, placement could not be confirmed due to current cardiac rhythm.    Scheduled Meds: . allopurinol  300 mg Oral Daily  . aspirin  81 mg Oral Daily  . clopidogrel  75 mg Oral Q breakfast  . colchicine  0.6 mg Oral Daily  . docusate sodium  100 mg Oral BID  . ferrous sulfate  325 mg Oral Q breakfast  . hydrALAZINE  50 mg Oral Q8H  . insulin aspart  0-15 Units Subcutaneous TID WC  . insulin aspart  0-5 Units Subcutaneous QHS  . metoprolol tartrate  12.5 mg Oral BID  . rosuvastatin  10 mg Oral Daily   Continuous Infusions: .  ceFAZolin (ANCEF) IV Stopped (11/08/17 1130)    Marzetta Board, MD, PhD [facility] Pager [phone]  If 7PM-7AM, please contact night-coverage [email or web address] Password [facility] 11/08/2017, 3:40 PM

## 2017-11-08 NOTE — Progress Notes (Addendum)
PHARMACY CONSULT NOTE FOR:  OUTPATIENT  PARENTERAL ANTIBIOTIC THERAPY (OPAT)  Indication: MSSA bacteremia from surgical site infection Regimen: Daptomycin 800 mg iv Q 24 hours End date: 11/19/17  IV antibiotic discharge orders are pended. To discharging provider:  please sign these orders via discharge navigator,  Select New Orders & click on the button choice - Manage This Unsigned Work.     Thank you for allowing pharmacy to be a part of this patient's care.  Anette Guarneri, PharmD [phone]

## 2017-11-08 NOTE — Care Management Note (Addendum)
Case Management Note  Patient Details  Name: MCKENLEY BIRENBAUM MRN: [id number] Date of Birth: 12-Oct-1935  Subjective/Objective:                    Action/Plan:Update patient has daughter in law who is a home health nurse and has offered to go to patient's home and give antibiotic daily. Patient's co pay for Daptomycin at home is $180/day.  ID willing to willing to change ABX to Rocephin twice a day. Patient does not want to ask her daughter in law to come twice a day so she wants stay here until Sunday's dose of Dapt. Then discharge home and come daily to short stay .   Physicians orders faxed to short stay .  Pam with [facility] following for home IV ABX, however patient states she drives every day to see her husband in memory care therefore she is not home bound, so she does not qualify for home health services.   Patient agreeable to come to [facility] Short stay daily for daptomycin. Short stay can begin seeing her daily including ( Saturday and Sunday ) starting Monday November 11, 2017 at 0900. Patient would have to remain inpatient at receive her dose of Daptomycin here Saturday( November 09, 2017 ) and Sunday (  June 16 ) then be discharged home. Expected Discharge Date:                  Expected Discharge Plan:  Home/Self Care  In-House Referral:     Discharge planning Services  CM Consult  Post Acute Care Choice:  NA Choice offered to:  Patient, Adult Children  DME Arranged:  N/A DME Agency:  NA  HH Arranged:  NA HH Agency:  NA  Status of Service:  Completed, signed off  If discussed at [place] of Stay Meetings, dates discussed:    Additional Comments:  Marilu Favre, RN 11/08/2017, 3:17 PM

## 2017-11-08 NOTE — Progress Notes (Addendum)
[facility] for Infectious Disease  Date of Admission:  11/04/2017   Total days of antibiotics 4        Day 4 cefazolin          Patient ID: Carrie [place] is a 82 y.o. female with  Principal Problem:   MSSA bacteremia Active Problems:   Wound infection after surgery   S/P TAVR (transcatheter aortic valve replacement)   Diabetes mellitus type 2 in obese [facility])   Essential hypertension   Chronic renal insufficiency, stage III (moderate) (HCC)   Acute respiratory failure with hypoxia (HCC)   Chronic diastolic CHF (congestive heart failure) (HCC)   Anemia   . allopurinol  300 mg Oral Daily  . aspirin  81 mg Oral Daily  . clopidogrel  75 mg Oral Q breakfast  . colchicine  0.6 mg Oral Daily  . docusate sodium  100 mg Oral BID  . ferrous sulfate  325 mg Oral Q breakfast  . hydrALAZINE  50 mg Oral Q8H  . insulin aspart  0-15 Units Subcutaneous TID WC  . insulin aspart  0-5 Units Subcutaneous QHS  . metoprolol tartrate  12.5 mg Oral BID  . rosuvastatin  10 mg Oral Daily    SUBJECTIVE: Feeling well and ready to go home. Very nervous about PICC line as she had some pretty traumatic problems during her TAVR procedure regarding the groin sheath. Would like more information to make her decision.   Allergies  Allergen Reactions  . Oxycodone Other (See Comments)    Hallunication    OBJECTIVE: Vitals:   11/07/17 1456 11/07/17 1511 11/07/17 2120 11/08/17 0422  BP: (!) 176/38 (!) 169/92 (!) 155/68 (!) 160/64  Pulse: 62 66 82 70  Resp: 17 18 18 18   Temp:  98.6 F (37 C) 98.2 F (36.8 C) 97.9 F (36.6 C)  TempSrc:  Oral Oral Oral  SpO2: 95% 96% 97% 96%  Weight:      Height:       Body mass index is 43.35 kg/m.  Physical Exam  Constitutional: She is oriented to person, place, and time. She appears well-developed and well-nourished.  Resting comfortably in recliner  HENT:  Mouth/Throat: Mucous membranes are normal. No oral lesions. Normal dentition. No  dental abscesses. No oropharyngeal exudate.  Scalp is open and packed. Bloody drainage. Much less swelling.   Cardiovascular: Normal rate, regular rhythm and normal heart sounds.  No murmur heard. Pulmonary/Chest: Effort normal and breath sounds normal.  Abdominal: Soft. She exhibits no distension. There is no tenderness.  Lymphadenopathy:    She has no cervical adenopathy.  Neurological: She is alert and oriented to person, place, and time.  Skin: Skin is warm and dry. No rash noted.  Psychiatric: She has a normal mood and affect. Judgment normal.    Lab Results Lab Results  Component Value Date   WBC 5.1 11/08/2017   HGB 9.3 (L) 11/08/2017   HCT 29.9 (L) 11/08/2017   MCV 99.0 11/08/2017   PLT 107 (L) 11/08/2017    Lab Results  Component Value Date   CREATININE 1.04 (H) 11/08/2017   BUN 22 (H) 11/08/2017   NA 134 (L) 11/08/2017   K 4.2 11/08/2017   CL 106 11/08/2017   CO2 23 11/08/2017    Lab Results  Component Value Date   ALT 18 11/04/2017   AST 22 11/04/2017   ALKPHOS 105 11/04/2017   BILITOT 1.1 11/04/2017  Microbiology: BCx 11/04/17 >> 2/2 MSSA (R-clindamycin, erythromycin)  BCx 11/05/17 >> NG Scalp Culture 6/11 >> staph aureus (R-clindamycin, erythromycin)    ASSESSMENT: 82 y.o. female admitted with fevers and AMS following recent excision of SCC from her scalp. Found to have MSSA bacteremia in 2/2 cultures and was narrowed to cefazolin. TEE was pursued with history of TAVR prosthetic valve without evidence of vegetation. No other signs of metastatic infection. Source from bacteremia 2/2 scalp infection (she had bleeding post op and same MSSA growing from culture). I spent a long time discussing with her today and understanding what her worries were about PICC line. I have also asked Carolynn Sayers to see if they can facilitate with informing her more about the therapy so she can make her decision - I also told her that this was the recommendation we would give her  for treatment to ensure best chance of cure for her and lower the risk of relapsing infection and potential infection of PV. She understands and would like to discuss with [facility] team.   OK for midline to be placed vs PICC if this helps her decision/fear. Will do Daptomycin Q24h with outpatient infusion enter to complete. If she declines PICC alternative choice would be cephalexin 500 mg QID - if this is what she chooses would treat for 25 more days to give 4 weeks with follow up in ID office.Unable to do linezolid with current thrombocytopenia. OPAT detailed below.    PLAN: 1. MSSA Bacteremia (R-erythro, clinda) = secondary from surgical site infection. I have recommended finishing course of treatment with PICC line and daptomycin for 14d from 6/11. OK to place Midline Catheter or PICC today.  2. Surgical Site Infection = appreciate surgery draining site. FU arranged with dermatology.    3. Medication Monitoring = wbc normal-continue to watch. Platelets low - seems as this is chronic (133k in 03/2017) and likely exacerbated by sepsis. CK baseline.   OPAT ORDERS:  Diagnosis: MSSA bacteremia 2/2 scalp surgical site infection   Culture Result: MSSA (R-clinda, erythro)  Allergies  Allergen Reactions  . Oxycodone Other (See Comments)    Hallunication    Discharge antibiotics: Daptomycin 8mg /kg Q24h   Duration: 14 days total   End Date: 11/19/17  [facility] Care and Maintenance Per Protocol _x_ Please pull PIC at completion of IV antibiotics  Labs weekly while on IV antibiotics: _x_ CBC with differential _x_ BMP _x_ CK   Fax weekly labs to [phone]  Clinic Follow Up Appt: 2 weeks (Week of 7/1) @ [facility] with Dr. Linus Salmons / [name] --> our office will call her to set this up      [place] Antimicrobial Management Team Staphylococcus aureus bacteremia   Staphylococcus aureus bacteremia (SAB) is associated with a high rate of complications and mortality.  Specific aspects of  clinical management are critical to optimizing the outcome of patients with SAB.  Therefore, the [facility] Antimicrobial Management Team [facility]) has initiated an intervention aimed at improving the management of SAB at [facility].  To do so, Infectious Diseases physicians are providing an evidence-based consult for the management of all patients with SAB.     Yes No Comments  Perform follow-up blood cultures (even if the patient is afebrile) to ensure clearance of bacteremia [x]  []  6/11 NG  Remove vascular catheter and obtain follow-up blood cultures after the removal of the catheter []  [x]  na  Perform echocardiography to evaluate for endocarditis (transthoracic ECHO is 40-50% sensitive, TEE is >  90% sensitive) [x]  []  TTE negative --> TEE recommended with PV  Consult electrophysiologist to evaluate implanted cardiac device (pacemaker, ICD) []  [x]    Ensure source control [x]  []  Scalp properly drained   Investigate for "metastatic" sites of infection [x]  []  TEE pending. ROS otherwise negative.  Change antibiotic therapy to cefazolin  [x]  []  Beta-lactam antibiotics are preferred for MSSA due to higher cure rates.   If on Vancomycin, goal trough should be 15 - 20 mcg/mL  Estimated duration of IV antibiotic therapy:   [x]  []  14 days      Janene Madeira, MSN, NP-C [facility] for Infectious [place] Cell: [phone] Pager: [phone]  11/08/2017  9:24 AM

## 2017-11-08 NOTE — Discharge Instructions (Signed)
Follow with Marton Redwood, MD in 5-7 days  Please get a complete blood count and chemistry panel checked by your Primary MD at your next visit, and again as instructed by your Primary MD. Please get your medications reviewed and adjusted by your Primary MD.  Please request your Primary MD to go over all Hospital Tests and Procedure/Radiological results at the follow up, please get all Hospital records sent to your Prim MD by signing hospital release before you go home.  If you had Pneumonia of Lung problems at the Hospital: Please get a 2 view Chest X ray done in 6-8 weeks after hospital discharge or sooner if instructed by your Primary MD.  If you have Congestive Heart Failure: Please call your Cardiologist or Primary MD anytime you have any of the following symptoms:  1) 3 pound weight gain in 24 hours or 5 pounds in 1 week  2) shortness of breath, with or without a dry hacking cough  3) swelling in the hands, feet or stomach  4) if you have to sleep on extra pillows at night in order to breathe  Follow cardiac low salt diet and 1.5 lit/day fluid restriction.  If you have diabetes Accuchecks 4 times/day, Once in AM empty stomach and then before each meal. Log in all results and show them to your primary doctor at your next visit. If any glucose reading is under 80 or above 300 call your primary MD immediately.  If you have Seizure/Convulsions/Epilepsy: Please do not drive, operate heavy machinery, participate in activities at heights or participate in high speed sports until you have seen by Primary MD or a Neurologist and advised to do so again.  If you had Gastrointestinal Bleeding: Please ask your Primary MD to check a complete blood count within one week of discharge or at your next visit. Your endoscopic/colonoscopic biopsies that are pending at the time of discharge, will also need to followed by your Primary MD.  Get Medicines reviewed and adjusted. Please take all your  medications with you for your next visit with your Primary MD  Please request your Primary MD to go over all hospital tests and procedure/radiological results at the follow up, please ask your Primary MD to get all Hospital records sent to his/her office.  If you experience worsening of your admission symptoms, develop shortness of breath, life threatening emergency, suicidal or homicidal thoughts you must seek medical attention immediately by calling 911 or calling your MD immediately  if symptoms less severe.  You must read complete instructions/literature along with all the possible adverse reactions/side effects for all the Medicines you take and that have been prescribed to you. Take any new Medicines after you have completely understood and accpet all the possible adverse reactions/side effects.   Do not drive or operate heavy machinery when taking Pain medications.   Do not take more than prescribed Pain, Sleep and Anxiety Medications  Special Instructions: If you have smoked or chewed Tobacco  in the last 2 yrs please stop smoking, stop any regular Alcohol  and or any Recreational drug use.  Wear Seat belts while driving.  Please note You were cared for by a hospitalist during your hospital stay. If you have any questions about your discharge medications or the care you received while you were in the hospital after you are discharged, you can call the unit and asked to speak with the hospitalist on call if the hospitalist that took care of you is not available. Once  you are discharged, your primary care physician will handle any further medical issues. Please note that NO REFILLS for any discharge medications will be authorized once you are discharged, as it is imperative that you return to your primary care physician (or establish a relationship with a primary care physician if you do not have one) for your aftercare needs so that they can reassess your need for medications and monitor your  lab values.  You can reach the hospitalist office at phone [phone] or fax [phone]   If you do not have a primary care physician, you can call [phone] for a physician referral.  Activity: As tolerated with Full fall precautions use walker/cane & assistance as needed  Diet: heart healthy  Disposition Home

## 2017-11-08 NOTE — Evaluation (Signed)
Physical Therapy Evaluation Patient Details Name: Carrie Monroe MRN: [id number] DOB: 1935-10-31 Today's Date: 11/08/2017   History of Present Illness  Pt is a 82 y/o female admitted secondary to fevers and AMS secondary to infection in scalp wound. Pt with recent removal of squamos cell carcinoma. Pt is also s/p TEE on 6/13. PMH includes DM, dCHF, HTN, CKD, CAD s/p stent placement, and s/p TAVR.   Clinical Impression  Pt admitted secondary to problem above with deficits below. Pt tolerated gait in the room well, requiring min guard A for mobility using RW. Discussed use of DME at home and pt reports daughter will be coming in from out of town for a week, so can provide assist to pt. Will continue to follow acutely to maximize functional mobility independence and safety.     Follow Up Recommendations Home health PT    Equipment Recommendations  None recommended by PT    Recommendations for Other Services OT consult     Precautions / Restrictions Precautions Precautions: Fall Restrictions Weight Bearing Restrictions: No      Mobility  Bed Mobility Overal bed mobility: Modified Independent             General bed mobility comments: Increased time required, however, no physical assist needed.   Transfers Overall transfer level: Needs assistance Equipment used: Rolling walker (2 wheeled) Transfers: Sit to/from Stand Sit to Stand: Min assist         General transfer comment: Min A for steadying assist. Verbal cues for safe hand placement.   Ambulation/Gait Ambulation/Gait assistance: Min guard Gait Distance (Feet): 15 Feet Assistive device: Rolling walker (2 wheeled) Gait Pattern/deviations: Step-through pattern;Decreased stride length;Trunk flexed Gait velocity: Decreased    General Gait Details: Slow, cautious gait. Distance limited to bathroom and back as pt with bathroom privileges. Min guard for safety. Cues for upright posture and proximity to device.    Stairs            Wheelchair Mobility    Modified Rankin (Stroke Patients Only)       Balance Overall balance assessment: Needs assistance Sitting-balance support: No upper extremity supported;Feet supported Sitting balance-Leahy Scale: Fair     Standing balance support: Bilateral upper extremity supported;During functional activity Standing balance-Leahy Scale: Poor Standing balance comment: Reliant on BUE support.                              Pertinent Vitals/Pain Pain Assessment: No/denies pain    Home Living Family/patient expects to be discharged to:: Private residence Living Arrangements: Alone Available Help at Discharge: Family;Available 24 hours/day(reports daughter coming in to stay for a week. ) Type of Home: House Home Access: Ramped entrance     Home Layout: One level Home Equipment: Bedside commode;Grab bars - tub/shower;Wheelchair - [employer] - 2 wheels;Cane - single point      Prior Function Level of Independence: Independent with assistive device(s)         Comments: Uses RW for longer distance ambulation. Still driving.      Hand Dominance   Dominant Hand: Right    Extremity/Trunk Assessment   Upper Extremity Assessment Upper Extremity Assessment: Defer to OT evaluation    Lower Extremity Assessment Lower Extremity Assessment: Generalized weakness    Cervical / Trunk Assessment Cervical / Trunk Assessment: Normal  Communication   Communication: No difficulties  Cognition Arousal/Alertness: Awake/alert Behavior During Therapy: WFL for tasks assessed/performed Overall Cognitive Status: Within Functional  Limits for tasks assessed                                        General Comments      Exercises     Assessment/Plan    PT Assessment Patient needs continued PT services  PT Problem List Decreased strength;Decreased balance;Decreased mobility;Decreased knowledge of use of DME        PT Treatment Interventions Gait training;DME instruction;Functional mobility training;Therapeutic activities;Therapeutic exercise;Balance training;Patient/family education    PT Goals (Current goals can be found in the Care Plan section)  Acute Rehab PT Goals Patient Stated Goal: to go home  PT Goal Formulation: With patient Time For Goal Achievement: 11/22/17 Potential to Achieve Goals: Good    Frequency Min 3X/week   Barriers to discharge        Co-evaluation               AM-PAC PT "6 Clicks" Daily Activity  Outcome Measure Difficulty turning over in bed (including adjusting bedclothes, sheets and blankets)?: A Little Difficulty moving from lying on back to sitting on the side of the bed? : A Little Difficulty sitting down on and standing up from a chair with arms (e.g., wheelchair, bedside commode, etc,.)?: Unable Help needed moving to and from a bed to chair (including a wheelchair)?: A Little Help needed walking in hospital room?: A Little Help needed climbing 3-5 steps with a railing? : A Lot 6 Click Score: 15    End of Session Equipment Utilized During Treatment: Gait belt Activity Tolerance: Patient tolerated treatment well Patient left: in bed;with call bell/phone within reach Nurse Communication: Mobility status PT Visit Diagnosis: Other abnormalities of gait and mobility (R26.89);Muscle weakness (generalized) (M62.81);Unsteadiness on feet (R26.81)    Time: 3646-8032 PT Time Calculation (min) (ACUTE ONLY): 13 min   Charges:   PT Evaluation $PT Eval Low Complexity: 1 Low     PT G Codes:        Leighton Ruff, PT, DPT  Acute Rehabilitation Services  Pager: [phone]   Rudean Hitt 11/08/2017, 2:28 PM

## 2017-11-09 ENCOUNTER — Encounter (HOSPITAL_COMMUNITY): Payer: Self-pay | Admitting: Cardiovascular Disease

## 2017-11-09 LAB — GLUCOSE, CAPILLARY
GLUCOSE-CAPILLARY: 150 mg/dL — AB (ref 65–99)
GLUCOSE-CAPILLARY: 156 mg/dL — AB (ref 65–99)
Glucose-Capillary: 166 mg/dL — ABNORMAL HIGH (ref 65–99)
Glucose-Capillary: 182 mg/dL — ABNORMAL HIGH (ref 65–99)

## 2017-11-09 MED ORDER — SODIUM CHLORIDE 0.9% FLUSH: 10.0000 mL | INTRAVENOUS | Status: DC | PRN

## 2017-11-09 MED ORDER — SODIUM CHLORIDE 0.9% FLUSH
10.0000 mL | Freq: Two times a day (BID) | INTRAVENOUS | Status: DC
  Administered 2017-11-09: 10 mL

## 2017-11-09 MED ORDER — SODIUM CHLORIDE 0.9 % IV SOLN
800.0000 mg | INTRAVENOUS | Status: DC
  Administered 2017-11-10: 800 mg via INTRAVENOUS
  Filled 2017-11-09: qty 16

## 2017-11-09 NOTE — Progress Notes (Signed)
Pharmacy Antibiotic Note  Carrie Monroe is a 82 y.o. female admitted on 11/04/2017 with cellulitis, found to have MSSA bacteremia from surgical site infection.  Pharmacy has been consulted to transition from Ancef to Cubicin for ease of administration post discharge.  Renal function is stabilizing.  Afebrile, WBC WNL.   Plan: Per discussion with Dr. Cruzita Lederer, Ancef 2gm IV Q8H x 2 more doses, then start Cubicin 800mg  IV Q24H on 11/10/17 Recommend checking CK weekly after starting Cubicin   Height: 5\' 3"  (160 cm) Weight: 244 lb 11.4 oz (111 kg) IBW/kg (Calculated) : 52.4  Temp (24hrs), Avg:98.5 F (36.9 C), Min:97.9 F (36.6 C), Max:98.9 F (37.2 C)  Recent Labs  Lab 11/04/17 1105 11/04/17 1106 11/04/17 1430 11/05/17 0534 11/06/17 0615 11/06/17 1400 11/08/17 0555  WBC 10.8*  --   --  5.4 4.7 4.7 5.1  CREATININE 1.36*  --   --  1.34*  --  1.04* 1.04*  LATICACIDVEN  --  1.01 0.93  --   --   --   --     Estimated Creatinine Clearance: 52.5 mL/min (A) (by C-G formula based on SCr of 1.04 mg/dL (H)).    Allergies  Allergen Reactions  . Oxycodone Other (See Comments)    Hallunication     Vanc 6/10 >> 6/11 Ancef 6/11 >> 6/16 Cubicin 6/16 >> (6/25)  6/10 BCx - MSSA 6/10 UCx - Pseudomonas (pan sensitive) 6/11 scalp wound - MSSA 6/12 BCx - NGTD   Carrie Monroe D. Mina Marble, PharmD, BCPS, BCCCP Pager:  [phone] 11/09/2017, 11:05 AM

## 2017-11-09 NOTE — Progress Notes (Signed)
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  Assessment & Plan: Principal Problem:   MSSA bacteremia Active Problems:   Diabetes mellitus type 2 in obese [facility])   Essential hypertension   Chronic renal insufficiency, stage III (moderate) (HCC)   S/P TAVR (transcatheter aortic valve replacement)   Acute respiratory failure with hypoxia (HCC)   Chronic diastolic CHF (congestive heart failure) (HCC)   Anemia   Wound infection after surgery   Severe sepsis due to MSSA bacteremia in the setting of infected scalp incision -Met SIRS criteria, plus a source, as well as showed signs of en dorgan perfusion with encephalopathy -Remains clinically stable, sepsis physiology resolved -2D echo on 6/12 negative for vegetations, TEE negative as well -Surveillance cultures have remained negative, PICC line pending -change to Cubicin, cannot qualify for home health, stay in house until Sunday then antibiotics will be via short stay  Aortic stenosis status post TAVR -TEE unremarkable   Chronic diastolic CHF -Holding fluids, she is eating well -She appears euvolemic -TEE stable  Chronic kidney disease stage III -Creatinine appears at baseline  Thrombocytopenia -Recent prior values were within  normal limits however she did have thrombocytopenia in the past so suspect a chronic component potentially worsened by her sepsis -No bleeding, continue to monitor, platelets improving  Coronary artery disease -No chest pain  Diet-controlled type 2 diabetes mellitus -Continue sliding scale  Hypertension -Continue Lopressor pressure stable today  Iron deficiencyanemia/in the setting of chronic kidney disease -Continue iron supplementations, hemoglobin stable   DVT prophylaxis: SCDs Code Status: DNR Family Communication: No family at bedside Disposition Plan: Home sunday  Consultants:   Infectious disease  General surgery  Procedures:   2D echo  TEE  Antimicrobials:  Cefazolin   Subjective: -no complaints  Objective: Vitals:   11/08/17 0422 11/08/17 1459 11/08/17 2123 11/09/17 0527  BP: (!) 160/64 (!) 141/53 (!) 148/51 (!) 154/58  Pulse: 70 70 75 73  Resp: _0 Temp: 97.9 F (36.6 C) 97.9 F (36.6 C) 98.6 F (37 C) 98.9 F (37.2 C)  TempSrc: Oral Oral Oral Oral  SpO2: 96% 99% 98% 97%  Weight:      Height:        Intake/Output Summary (Last 24 hours) at 11/09/2017 1318 Last data filed at 11/09/2017 0940 Gross per 24 hour  Intake 700 ml  Output -  Net 700 ml   Filed Weights   11/04/17 1048 11/04/17 1736  Weight: 104.3 kg (230 lb) 111 kg (244 lb 11.4 oz)    Examination:  Constitutional: NAD Respiratory: CTA Cardiovascular: RRR   Data Reviewed: I have independently reviewed following labs and imaging studies   CBC: Recent Labs  Lab 11/04/17 1105 11/05/17 0534 11/06/17 0615 11/06/17 1400 11/08/17 0555  WBC 10.8* 5.4 4.7 4.7 5.1  NEUTROABS 9.4*  --   --   --   --  HGB 10.5* 9.9* 9.3* 10.1* 9.3*  HCT 32.3* 30.9* 29.1* 31.8* 29.9*  MCV 98.2 98.7 99.0 98.5 99.0  PLT 93* 82* 75* 95* 109*   Basic Metabolic Panel: Recent Labs  Lab 11/04/17 1105 11/05/17 0534 11/06/17 1400 11/08/17 0555  NA 137 134* 139 134*  K 3.8 4.2  3.9 4.2  CL 107 106 109 106  CO2 20* 21* 22 23  GLUCOSE 207* 155* 162* 154*  BUN 28* 28* 27* 22*  CREATININE 1.36* 1.34* 1.04* 1.04*  CALCIUM 8.9 8.5* 8.9 8.6*   GFR: Estimated Creatinine Clearance: 52.5 mL/min (A) (by C-G formula based on SCr of 1.04 mg/dL (H)). Liver Function Tests: Recent Labs  Lab 11/04/17 1105  AST 22  ALT 18  ALKPHOS 105  BILITOT 1.1  PROT 6.1*  ALBUMIN 3.0*   No results for input(s): LIPASE, AMYLASE in the last 168 hours. No results for input(s): AMMONIA in the last 168 hours. Coagulation Profile: Recent Labs  Lab 11/04/17 1105  INR 1.20   Cardiac Enzymes: No results for input(s): CKTOTAL, CKMB, CKMBINDEX, TROPONINI in the last 168 hours. BNP (last 3 results) No results for input(s): PROBNP in the last 8760 hours. HbA1C: No results for input(s): HGBA1C in the last 72 hours. CBG: Recent Labs  Lab 11/08/17 1206 11/08/17 1704 11/08/17 2129 11/09/17 0718 11/09/17 1301  GLUCAP 179* 157* 154* 150* 156*   Lipid Profile: No results for input(s): CHOL, HDL, LDLCALC, TRIG, CHOLHDL, LDLDIRECT in the last 72 hours. Thyroid Function Tests: No results for input(s): TSH, T4TOTAL, FREET4, T3FREE, THYROIDAB in the last 72 hours. Anemia Panel: No results for input(s): VITAMINB12, FOLATE, FERRITIN, TIBC, IRON, RETICCTPCT in the last 72 hours. Urine analysis:    Component Value Date/Time   COLORURINE YELLOW 11/04/2017 1105   APPEARANCEUR HAZY (A) 11/04/2017 1105   LABSPEC 1.018 11/04/2017 1105   PHURINE 5.0 11/04/2017 1105   GLUCOSEU NEGATIVE 11/04/2017 1105   HGBUR MODERATE (A) 11/04/2017 1105   BILIRUBINUR NEGATIVE 11/04/2017 1105   KETONESUR 20 (A) 11/04/2017 1105   PROTEINUR >=300 (A) 11/04/2017 1105   UROBILINOGEN 1.0 07/28/2013 1305   NITRITE NEGATIVE 11/04/2017 1105   LEUKOCYTESUR TRACE (A) 11/04/2017 1105   Sepsis Labs: Invalid input(s): PROCALCITONIN, LACTICIDVEN  Recent Results (from the past 240 hour(s))  Culture, blood (Routine x  2)     Status: Abnormal   Collection Time: 11/04/17 11:06 AM  Result Value Ref Range Status   Specimen Description BLOOD BLOOD RIGHT FOREARM  Final   Special Requests   Final    BOTTLES DRAWN AEROBIC AND ANAEROBIC Blood Culture adequate volume   Culture  Setup Time   Final    GRAM POSITIVE COCCI IN BOTH AEROBIC AND ANAEROBIC BOTTLES CRITICAL RESULT CALLED TO, READ BACK BY AND VERIFIED WITH: [facility] 11/05/17 0026 JDW Performed at [facility], [place] [address] [place], [place] [address]    Culture STAPHYLOCOCCUS AUREUS (A)  Final   Report Status 11/06/2017 FINAL  Final   Organism ID, Bacteria STAPHYLOCOCCUS AUREUS  Final      Susceptibility   Staphylococcus aureus - MIC*    CIPROFLOXACIN <=0.5 SENSITIVE Sensitive     ERYTHROMYCIN >=8 RESISTANT Resistant     GENTAMICIN <=0.5 SENSITIVE Sensitive     OXACILLIN 0.5 SENSITIVE Sensitive     TETRACYCLINE <=1 SENSITIVE Sensitive     VANCOMYCIN 1 SENSITIVE Sensitive     TRIMETH/SULFA <=10 SENSITIVE Sensitive     CLINDAMYCIN RESISTANT Resistant  RIFAMPIN <=0.5 SENSITIVE Sensitive     Inducible Clindamycin POSITIVE Resistant     * STAPHYLOCOCCUS AUREUS  Blood Culture ID Panel (Reflexed)     Status: Abnormal   Collection Time: 11/04/17 11:06 AM  Result Value Ref Range Status   Enterococcus species NOT DETECTED NOT DETECTED Final   Listeria monocytogenes NOT DETECTED NOT DETECTED Final   Staphylococcus species DETECTED (A) NOT DETECTED Final    Comment: CRITICAL RESULT CALLED TO, READ BACK BY AND VERIFIED WITH: G ABBOTT PHARMD 11/05/17 0026 JDW    Staphylococcus aureus DETECTED (A) NOT DETECTED Final    Comment: Methicillin (oxacillin) susceptible Staphylococcus aureus (MSSA). Preferred therapy is anti staphylococcal beta lactam antibiotic (Cefazolin or Nafcillin), unless clinically contraindicated. CRITICAL RESULT CALLED TO, READ BACK BY AND VERIFIED WITH: G ABBOTT PHARMD 11/05/17 0026 JDW    Methicillin resistance NOT  DETECTED NOT DETECTED Final   Streptococcus species NOT DETECTED NOT DETECTED Final   Streptococcus agalactiae NOT DETECTED NOT DETECTED Final   Streptococcus pneumoniae NOT DETECTED NOT DETECTED Final   Streptococcus pyogenes NOT DETECTED NOT DETECTED Final   Acinetobacter baumannii NOT DETECTED NOT DETECTED Final   Enterobacteriaceae species NOT DETECTED NOT DETECTED Final   Enterobacter cloacae complex NOT DETECTED NOT DETECTED Final   Escherichia coli NOT DETECTED NOT DETECTED Final   Klebsiella oxytoca NOT DETECTED NOT DETECTED Final   Klebsiella pneumoniae NOT DETECTED NOT DETECTED Final   Proteus species NOT DETECTED NOT DETECTED Final   Serratia marcescens NOT DETECTED NOT DETECTED Final   Haemophilus influenzae NOT DETECTED NOT DETECTED Final   Neisseria meningitidis NOT DETECTED NOT DETECTED Final   Pseudomonas aeruginosa NOT DETECTED NOT DETECTED Final   Candida albicans NOT DETECTED NOT DETECTED Final   Candida glabrata NOT DETECTED NOT DETECTED Final   Candida krusei NOT DETECTED NOT DETECTED Final   Candida parapsilosis NOT DETECTED NOT DETECTED Final   Candida tropicalis NOT DETECTED NOT DETECTED Final    Comment: Performed at [facility], [place]. [address], [place], [place] [address]  Culture, blood (Routine x 2)     Status: Abnormal   Collection Time: 11/04/17 11:08 AM  Result Value Ref Range Status   Specimen Description BLOOD RIGHT ANTECUBITAL  Final   Special Requests   Final    BOTTLES DRAWN AEROBIC AND ANAEROBIC Blood Culture adequate volume   Culture  Setup Time   Final    IN BOTH AEROBIC AND ANAEROBIC BOTTLES GRAM POSITIVE COCCI CRITICAL RESULT CALLED TO, READ BACK BY AND VERIFIED WITH: [facility] 11/05/17 0026 JDW Performed at [facility], [place] [address], [place], [place] [address]    Culture (A)  Final    STAPHYLOCOCCUS AUREUS SUSCEPTIBILITIES PERFORMED ON PREVIOUS CULTURE WITHIN THE LAST 5 DAYS.    Report Status 11/07/2017 FINAL  Final    Urine culture     Status: Abnormal   Collection Time: 11/04/17 11:09 AM  Result Value Ref Range Status   Specimen Description URINE, CATHETERIZED  Final   Special Requests   Final    NONE Performed at [facility], [address], [place], [place] [address]    Culture 10,000 COLONIES/mL PSEUDOMONAS AERUGINOSA (A)  Final   Report Status 11/06/2017 FINAL  Final   Organism ID, Bacteria PSEUDOMONAS AERUGINOSA (A)  Final      Susceptibility   Pseudomonas aeruginosa - MIC*    CEFTAZIDIME 4 SENSITIVE Sensitive     CIPROFLOXACIN <=0.25 SENSITIVE Sensitive  GENTAMICIN <=1 SENSITIVE Sensitive     IMIPENEM 2 SENSITIVE Sensitive     CEFEPIME 4 SENSITIVE Sensitive     * 10,000 COLONIES/mL PSEUDOMONAS AERUGINOSA  Aerobic Culture (superficial specimen)     Status: None   Collection Time: 11/05/17  1:24 PM  Result Value Ref Range Status   Specimen Description WOUND SCALP  Final   Special Requests NONE  Final   Gram Stain   Final    NO WBC SEEN FEW GRAM POSITIVE COCCI IN PAIRS Performed at [facility], [place] [address], [place], [place] [address]    Culture MODERATE STAPHYLOCOCCUS AUREUS  Final   Report Status 11/08/2017 FINAL  Final   Organism ID, Bacteria STAPHYLOCOCCUS AUREUS  Final      Susceptibility   Staphylococcus aureus - MIC*    CIPROFLOXACIN <=0.5 SENSITIVE Sensitive     ERYTHROMYCIN RESISTANT Resistant     GENTAMICIN <=0.5 SENSITIVE Sensitive     OXACILLIN 0.5 SENSITIVE Sensitive     TETRACYCLINE <=1 SENSITIVE Sensitive     VANCOMYCIN 1 SENSITIVE Sensitive     TRIMETH/SULFA <=10 SENSITIVE Sensitive     CLINDAMYCIN RESISTANT Resistant     RIFAMPIN <=0.5 SENSITIVE Sensitive     Inducible Clindamycin POSITIVE Resistant     * MODERATE STAPHYLOCOCCUS AUREUS  Culture, blood (routine x 2)     Status: None (Preliminary result)   Collection Time: 11/06/17  6:09 AM  Result Value Ref Range Status   Specimen Description BLOOD LEFT HAND  Final   Special Requests    Final    BOTTLES DRAWN AEROBIC AND ANAEROBIC Blood Culture adequate volume   Culture   Final    NO GROWTH 3 DAYS Performed at [facility], [address] [place], [place] [address]    Report Status PENDING  Incomplete  Culture, blood (routine x 2)     Status: None (Preliminary result)   Collection Time: 11/06/17 12:43 PM  Result Value Ref Range Status   Specimen Description BLOOD RIGHT HAND  Final   Special Requests   Final    BOTTLES DRAWN AEROBIC ONLY Blood Culture adequate volume   Culture   Final    NO GROWTH 3 DAYS Performed at [facility], [place] [address] [place], [place] [address]    Report Status PENDING  Incomplete      Radiology Studies: No results found.   Scheduled Meds: . allopurinol  300 mg Oral Daily  . aspirin  81 mg Oral Daily  . clopidogrel  75 mg Oral Q breakfast  . colchicine  0.6 mg Oral Daily  . docusate sodium  100 mg Oral BID  . ferrous sulfate  325 mg Oral Q breakfast  . hydrALAZINE  50 mg Oral Q8H  . insulin aspart  0-15 Units Subcutaneous TID WC  . insulin aspart  0-5 Units Subcutaneous QHS  . metoprolol tartrate  12.5 mg Oral BID  . rosuvastatin  10 mg Oral Daily   Continuous Infusions: .  ceFAZolin (ANCEF) IV Stopped (11/09/17 1022)  . [START ON 11/10/2017] DAPTOmycin (CUBICIN)  IV      Marzetta Board, MD, PhD [facility] Pager [phone]  If 7PM-7AM, please contact night-coverage [email or web address] Password [email or web address] 11/09/2017, 1:18 PM

## 2017-11-09 NOTE — Progress Notes (Signed)
PT Cancellation Note  Patient Details Name: Carrie Monroe MRN: [id number] DOB: 05-06-1936   Cancelled Treatment:     Attempted to see patient in the afternoon. Patient was having a picc line placed. Per nursing it would be about an hour and then she would need bed rest for a short period. Therapy will return on 11/10/2017.    Carney Living 11/09/2017, 2:18 PM

## 2017-11-09 NOTE — Progress Notes (Signed)
Peripherally Inserted Central Catheter/Midline Placement  The IV Nurse has discussed with the patient and/or persons authorized to consent for the patient, the purpose of this procedure and the potential benefits and risks involved with this procedure.  The benefits include less needle sticks, lab draws from the catheter, and the patient may be discharged home with the catheter. Risks include, but not limited to, infection, bleeding, blood clot (thrombus formation), and puncture of an artery; nerve damage and irregular heartbeat and possibility to perform a PICC exchange if needed/ordered by physician.  Alternatives to this procedure were also discussed.  Bard Power PICC patient education guide, fact sheet on infection prevention and patient information card has been provided to patient /or left at bedside.    PICC/Midline Placement Documentation  PICC Single Lumen 11/09/17 PICC Right Brachial 35 cm 0 cm (Active)  Indication for Insertion or Continuance of Line Home intravenous therapies (PICC only) 11/09/2017  3:02 PM  Exposed Catheter (cm) 0 cm 11/09/2017  3:02 PM  Site Assessment Clean;Dry;Intact 11/09/2017  3:02 PM  Line Status Flushed;Saline locked;Blood return noted 11/09/2017  3:02 PM  Dressing Type Transparent 11/09/2017  3:02 PM  Dressing Status Intact;Dry;Clean;Antimicrobial disc in place 11/09/2017  3:02 PM  Dressing Change Due 11/16/17 11/09/2017  3:02 PM       Gordan Payment 11/09/2017, 3:03 PM

## 2017-11-10 DIAGNOSIS — E1169 Type 2 diabetes mellitus with other specified complication: Secondary | ICD-10-CM

## 2017-11-10 DIAGNOSIS — E669 Obesity, unspecified: Secondary | ICD-10-CM

## 2017-11-10 DIAGNOSIS — I5032 Chronic diastolic (congestive) heart failure: Secondary | ICD-10-CM

## 2017-11-10 DIAGNOSIS — N183 Chronic kidney disease, stage 3 (moderate): Secondary | ICD-10-CM

## 2017-11-10 LAB — GLUCOSE, CAPILLARY
Glucose-Capillary: 142 mg/dL — ABNORMAL HIGH (ref 65–99)
Glucose-Capillary: 178 mg/dL — ABNORMAL HIGH (ref 65–99)

## 2017-11-10 NOTE — Progress Notes (Signed)
Physical Therapy Treatment Patient Details Name: ALAIRA Monroe MRN: [id number] DOB: August 27, 1935 Today's Date: 11/10/2017    History of Present Illness Pt is a 82 y/o female admitted secondary to fevers and AMS secondary to infection in scalp wound. Pt with recent removal of squamos cell carcinoma. Pt is also s/p TEE on 6/13. PMH includes DM, dCHF, HTN, CKD, CAD s/p stent placement, and s/p TAVR.     PT Comments    Pt progressing towards physical therapy goals. Was able to perform transfers and ambulation with gross min guard assist for balance support and safety. Pt required cues for safety with RW use. Pt expressed concern regarding having to drive to her outpatient appts. Daughter will only be available for 1 week and husband is in a SNF. Recommend HHPT to follow up to maximize functional independence and return to PLOF. May be worth discussing an alternative option for pt to get her antibiotics at home vs attempting to drive when daughter is no longer available to help.   Follow Up Recommendations  Home health PT     Equipment Recommendations  None recommended by PT    Recommendations for Other Services       Precautions / Restrictions Precautions Precautions: Fall Restrictions Weight Bearing Restrictions: No    Mobility  Bed Mobility               General bed mobility comments: Pt sitting in recliner upon PT arrival.   Transfers Overall transfer [place]: Needs assistance Equipment used: Rolling walker (2 wheeled) Transfers: Sit to/from Stand Sit to Stand: Min guard Stand pivot transfers: Min guard       General transfer comment: Close guard for safety as pt powered up to full standing position. VC's for hand placement on seated surface and walker positioning for safety.  Ambulation/Gait Ambulation/Gait assistance: Min guard Gait Distance (Feet): 20 Feet Assistive device: Rolling walker (2 wheeled) Gait Pattern/deviations: Step-through pattern;Decreased  stride length;Trunk flexed Gait velocity: Decreased Gait velocity interpretation: <1.8 ft/sec, indicate of risk for recurrent falls General Gait Details: Slow and guarded. Pt declined ambulating outside of room, but went to and from the bathroom. Hands-on guarding for safety.    Stairs             Wheelchair Mobility    Modified Rankin (Stroke Patients Only)       Balance Overall balance assessment: Needs assistance Sitting-balance support: No upper extremity supported;Feet supported Sitting balance-Leahy Scale: Fair     Standing balance support: Bilateral upper extremity supported;During functional activity Standing balance-Leahy Scale: Fair Standing balance comment: Reliant on BUE support.                             Cognition Arousal/Alertness: Awake/alert Behavior During Therapy: WFL for tasks assessed/performed Overall Cognitive Status: Within Functional Limits for tasks assessed                                 General Comments: scored 25/30 on MOCA (Normal>=26/30);difficulty with fluency (3words in 60sec) and abstraction (1/2 similarity);pt required extended time for memory and delayed recall but was able to recall 4/5 words no cue and 1 category cue for 'church';      Exercises      General Comments General comments (skin integrity, edema, etc.): pt's daughter in room during session;educated pt/daughter on importance of intermittent S with driving;  Pertinent Vitals/Pain Pain Assessment: Faces Pain Score: 3  Faces Pain Scale: No hurt Pain Location: gout in LE Pain Descriptors / Indicators: Discomfort Pain Intervention(s): Limited activity within patient's tolerance    Home Living Family/patient expects to be discharged to:: Private residence Living Arrangements: Alone Available Help at Discharge: Family;Available 24 hours/day(reports daughter coming in to stay for a week. ) Type of Home: House Home Access: Ramped  entrance   Home Layout: One [facility] Home Equipment: Bedside commode;Grab bars - tub/shower;Wheelchair - [employer] - 2 wheels;Cane - single point Additional Comments: husband lives in nursing home    Prior Function [place]: Independent with assistive device(s)      Comments: Uses RW for longer distance ambulation;long handle shoe horn;Still driving.;goes to beauty salon for hair   PT Goals (current goals can now be found in the care plan section) Acute Rehab PT Goals Patient Stated Goal: to go home  PT Goal Formulation: With patient Time For Goal Achievement: 11/22/17 Potential to Achieve Goals: Good Progress towards PT goals: Progressing toward goals    Frequency    Min 3X/week      PT Plan Current plan remains appropriate    Co-evaluation              AM-PAC PT "6 Clicks" Daily Activity  Outcome Measure  Difficulty turning over in bed (including adjusting bedclothes, sheets and blankets)?: A Little Difficulty moving from lying on back to sitting on the side of the bed? : A Little Difficulty sitting down on and standing up from a chair with arms (e.g., wheelchair, bedside commode, etc,.)?: Unable Help needed moving to and from a bed to chair (including a wheelchair)?: A Little Help needed walking in hospital room?: A Little Help needed climbing 3-5 steps with a railing? : A Lot 6 Click Score: 15    End of Session Equipment Utilized During Treatment: Gait belt Activity Tolerance: Patient tolerated treatment well Patient left: in chair;with call bell/phone within reach;with nursing/sitter in room;with family/visitor present Nurse Communication: Mobility status PT Visit Diagnosis: Other abnormalities of gait and mobility (R26.89);Muscle weakness (generalized) (M62.81);Unsteadiness on feet (R26.81)     Time: 6979-4801 PT Time Calculation (min) (ACUTE ONLY): 19 min  Charges:  $Gait Training: 8-22 mins                    G Codes:       Rolinda Roan, PT, DPT Acute Rehabilitation Services Pager: [phone]    Thelma Comp 11/10/2017, 2:11 PM

## 2017-11-10 NOTE — Progress Notes (Signed)
OT Note - Addendum    11/10/17 1300  OT Visit Information  Last OT Received On 11/10/17  OT Time Calculation  OT Start Time (ACUTE ONLY) 1128  OT Stop Time (ACUTE ONLY) 1205  OT Time Calculation (min) 37 min  OT Evaluation  $OT Eval Moderate Complexity 1 Mod  OT Treatments  $Self Care/Home Management  8-22 mins  Maurie Boettcher, OT/L  OT Clinical Specialist [phone]

## 2017-11-10 NOTE — Discharge Summary (Signed)
Physician Discharge Summary  Carrie Monroe DSK:[id number] DOB: 02-Nov-1935 DOA: 11/04/2017  PCP: Marton Redwood, MD  Admit date: 11/04/2017 Discharge date: 11/10/2017  Admitted From: home Disposition:  home  Recommendations for Outpatient Follow-up:  1. Follow up with Dermatology or General surgery at patient's preference in 1 week 2. To continue daily daptomycin at [facility] short stay starting Monday, November 11, 2017 at 9 AM, recommendations from infectious disease were for 14 days from 6/11 with end date on 11/19/2017 3. Follow-up with infectious disease clinic, they will arrange appointment  ID recommendations Discharge antibiotics: Daptomycin 32m/kg Q24h   Duration: 14 days total   End Date: 11/19/17  [facility] Per Protocol _x_ Please pull PIC at completion of IV antibiotics  Labs weekly while on IV antibiotics: _x_ CBC with differential _x_ BMP _x_ CK   Fax weekly labs to [phone] Clinic Follow Up Appt: 2 weeks (Week of 7/1) @ [facility] with Dr. CLinus Salmons/ SColletta Maryland--> our office will call her to set this up  Home Health: PT Equipment/Devices: None  Discharge Condition: Stable CODE STATUS: DNR Diet recommendation: Regular  HPI: Per Dr. YLorin Mercy EMULAN ADANis a 82y.o. female with medical history significant of s/p TAVR; HTN; HLD; DM; CAD; chronic diastolic CHF; carotid stenosis; and breast CA s/p lumpectomy and XRT presenting with AMS.  She had skin surgery on Thursday.  She "took a turn for the worse" Saturday.  She took oxycodone and it made her hallucinate.  She was bleeding from the wound and went back to the doctor; he looked at it and said it looked good and instructed her to wash her hair.  She was planning to wash her hair today and she couldn't stand up.  She was not confused.  +fever. ED Course:  Fever, cellulitis on the top of her head.  H/o SCC removed on Thursday, wound recheck Saturday with bleeding, clots.  Today, a  friend checked on her - she was altered, fever, unable to get up.  T101.8, sats 88%.  No respiratory complaints.  Wound is definitely infected, stitches removed, will open it up to help it drain.  WBC 10.8, normal lactate, creatinine at baseline.  Normal CXR.  Hospital Course: Severe sepsis due to MSSA bacteremia in the setting of infected scalp incision -Met SIRS criteria, plus a source, as well as showed signs of en dorgan perfusion with encephalopathy.  She was placed on broad-spectrum antibiotics, and sepsis physiology improved and patient has returned to baseline.  Blood cultures have showed MSSA bacteremia.  Infectious disease was consulted.  She was further evaluated with a 2D echo and a TEE which were negative for vegetations.  General surgery was consulted as well due to concern for fluctuance on her scalp, status post I&D at bedside.  Wound cultures grew MSSA.  Surveillance cultures have remained negative, she had a PICC line placed, apparently did not qualify for home health RN to have antibiotics at home due to the fact that she is active and still driving --Per patient--, and thus her antibiotics were changed to daptomycin per ID to be a once daily dosing.  6 N. care management was consulted and arranged follow-up for daily antibiotics at the short stay.  Aortic stenosis status post TAVR -TEE unremarkable  Chronic diastolic CHF -She appears euvolemic Chronic kidney disease stage III -Creatinine appears at baseline Thrombocytopenia -Recent prior values were within normal limits however she did have thrombocytopenia in the past so  suspect a chronic component potentially worsened by her sepsis. Platelets improving.  Coronary artery disease -No chest pain Diet-controlled type 2 diabetes mellitus -outpatient follow up Hypertension -Continue home medications Iron deficiencyanemia/in the setting of chronic kidney disease -Continue iron supplementations, hemoglobin stable   Discharge Diagnoses:   Principal Problem:   MSSA bacteremia Active Problems:   Diabetes mellitus type 2 in obese [facility])   Essential hypertension   Chronic renal insufficiency, stage III (moderate) (HCC)   S/P TAVR (transcatheter aortic valve replacement)   Acute respiratory failure with hypoxia (HCC)   Chronic diastolic CHF (congestive heart failure) (HCC)   Anemia   Wound infection after surgery  Discharge Instructions  Discharge Instructions    Home infusion instructions [facility] May follow [place] Dosing Protocol; May administer Cathflo as needed to maintain patency of vascular access device.; Flushing of vascular access device: per [facility] Protocol: 0.9% NaCl pre/post medica...   Complete by:  As directed    Instructions:  May follow [place] Dosing Protocol   Instructions:  May administer Cathflo as needed to maintain patency of vascular access device.   Instructions:  Flushing of vascular access device: per [facility] Protocol: 0.9% NaCl pre/post medication administration and prn patency; Heparin 100 u/ml, 74m for implanted ports and Heparin 10u/ml, 567mfor all other central venous catheters.   Instructions:  May follow [facility] Anaphylaxis Protocol for First Dose Administration in the home: 0.9% NaCl at 25-50 ml/hr to maintain IV access for protocol meds. Epinephrine 0.3 ml IV/IM PRN and Benadryl 25-50 IV/IM PRN s/s of anaphylaxis.   Instructions:  AdFleischmannsnfusion Coordinator (RN) to assist per patient IV care needs in the home PRN.     Allergies as of 11/10/2017      Reactions   Oxycodone Other (See Comments)   Hallunication      Medication List    TAKE these medications   allopurinol 300 MG tablet Commonly known as:  ZYLOPRIM Take 300 mg by mouth daily.   aspirin 81 MG chewable tablet Chew 1 tablet (81 mg total) by mouth daily.   clopidogrel 75 MG tablet Commonly known as:  PLAVIX Take 1 tablet (75 mg total) by mouth daily with breakfast.   COLCRYS 0.6 MG tablet Generic  drug:  colchicine Take 0.6 mg by mouth daily.   COLLAGEN PO Take by mouth daily. power   daptomycin IVPB Commonly known as:  CUBICIN Inject 800 mg into the vein daily. Indication:  bacteremia Last Day of Therapy:  11/19/17 Labs - Once weekly:  CBC/D, BMP, and CPK Labs - Every other week:  ESR and CRP   ferrous sulfate 325 (65 FE) MG tablet Commonly known as:  FERROUSUL Take 1 tablet (325 mg total) 3 (three) times daily with meals by mouth. What changed:  when to take this   furosemide 40 MG tablet Commonly known as:  LASIX Take 1 tablet (40 mg total) by mouth daily as needed for fluid or edema.   hydrALAZINE 50 MG tablet Commonly known as:  APRESOLINE Take 50 mg by mouth every 8 (eight) hours.   metoprolol tartrate 25 MG tablet Commonly known as:  LOPRESSOR Take 0.5 tablets (12.5 mg total) by mouth 2 (two) times daily.   rosuvastatin 10 MG tablet Commonly known as:  CRESTOR Take 10 mg by mouth daily.   traMADol 50 MG tablet Commonly known as:  ULTRAM Take 50 mg by mouth every 6 (six) hours as needed.   VITAMIN D PO  Take 1 capsule by mouth daily.            Home Infusion Instuctions  (From admission, onward)        Start     Ordered   11/08/17 0000  Home infusion instructions [facility] May follow [place] Dosing Protocol; May administer Cathflo as needed to maintain patency of vascular access device.; Flushing of vascular access device: per [facility] Protocol: 0.9% NaCl pre/post medica...    Question Answer Comment  Instructions May follow [place] Dosing Protocol   Instructions May administer Cathflo as needed to maintain patency of vascular access device.   Instructions Flushing of vascular access device: per [facility] Protocol: 0.9% NaCl pre/post medication administration and prn patency; Heparin 100 u/ml, 29m for implanted ports and Heparin 10u/ml, 574mfor all other central venous catheters.   Instructions May follow [facility] Anaphylaxis Protocol for First  Dose Administration in the home: 0.9% NaCl at 25-50 ml/hr to maintain IV access for protocol meds. Epinephrine 0.3 ml IV/IM PRN and Benadryl 25-50 IV/IM PRN s/s of anaphylaxis.   Instructions [facility] Infusion Coordinator (RN) to assist per patient IV care needs in the home PRN.      11/08/17 1523     [facility]. Schedule an appointment as soon as possible for a visit on 11/25/2017.   Specialty:  Infectious Diseases Why:  Please call for follow up appointment the week of 11/25/17 with Dr. CoLinus Salmonsr StColletta [place]NP Contact information: [id number] [address].   Why:  Appointment every day starting Monday June 17,2019 at 0900 am  Contact information: [address][id number]     ThGeorganna SkeansMD. Schedule an appointment as soon as possible for a visit in 1 week(s).   Specialty:  General Surgery Why:  or with KeSaverio DankerPA Contact information: [phone]        LeIzora RibasSchedule an appointment as soon as possible for a visit in 2 week(s).   Specialty:  Dermatology Contact information: [phone] [phone]         Consultations:  ID  Cardiology   Procedures/Studies:  TEE Study Conclusions - Left ventricle: The cavity size was normal. Wall thickness was normal. Systolic function was at the lower limits of normal. The estimated ejection fraction was in the range of 50% to 55%. - Aortic valve: A stent-valve (TAVR) bioprosthesis was present and functioning normally. - Mitral valve: No evidence of vegetation. There was mild to moderate regurgitation directed centrally. - Left atrium: The atrium was  moderately dilated. No evidence of thrombus in the atrial cavity or appendage. No evidence of thrombus in the appendage. No spontaneous echo contrast was observed. - Right ventricle: The cavity size was mildly to moderately dilated. Systolic function was mildly to moderately reduced. - Right atrium: The atrium was moderately dilated. No evidence of thrombus in the atrial cavity or appendage. - Atrial septum: No defect or patent foramen ovale was identified. - Tricuspid valve: No evidence of vegetation. - Pulmonic valve: No evidence of vegetation. - Pulmonary arteries: Systolic pressure was moderately increased. PA peak pressure: 56 mm Hg (S).   2D echo  Study  Conclusions - Left ventricle: Wall thickness was increased in a pattern of mild LVH. Systolic function was mildly reduced. The estimated ejection fraction was in the range of 45% to 50%. Features are consistent with a pseudonormal left ventricular filling pattern, with concomitant abnormal relaxation and increased filling pressure (grade 2 diastolic dysfunction). - Ventricular septum: Septal motion showed abnormal function, dyssynergy, and paradox. These changes are consistent with intraventricular conduction delay. - Aortic valve: A stent-valve (TAVR) bioprosthesis was present and functioning normally. Valve area (VTI): 3.35 cm^2. Valve area (Vmax): 2.75 cm^2. Valve area (Vmean): 3.14 cm^2. - Mitral valve: Calcified annulus. - Left atrium: The atrium was moderately dilated. - Right ventricle: The cavity size was moderately dilated. Systolic function was mildly to moderately reduced. - Right atrium: The atrium was moderately dilated. - Pulmonary arteries: Systolic pressure was mildly increased. PA peak pressure: 36 mm Hg (S).   Dg Chest 2 View  Result Date: 11/04/2017 CLINICAL DATA:  Altered mental status. EXAM: CHEST - 2 VIEW COMPARISON:  02/07/2017 FINDINGS: There is no focal parenchymal opacity. There is no pleural effusion or  pneumothorax. There is stable cardiomegaly. There is thoracic aortic atherosclerosis. Mild osteoarthritis of the right glenohumeral joint. IMPRESSION: No acute cardiopulmonary disease. Electronically Signed   By: Kathreen Devoid   On: 11/04/2017 11:40   [place] Ekg Site Rite  Result Date: 11/07/2017 If Site Rite image not attached, placement could not be confirmed due to current cardiac rhythm.    Subjective: - no chest pain, shortness of breath, no abdominal pain, nausea or vomiting.   Discharge Exam: Vitals:   11/10/17 0518 11/10/17 1405  BP: (!) 160/59 (!) 161/92  Pulse: 64 67  Resp: 16 18  Temp: 98 F (36.7 C) (!) 97.5 F (36.4 C)  SpO2: 95% 97%   General: Pt is alert, awake, not in acute distress Cardiovascular: RRR Respiratory: CTA bilaterally, no wheezing, no rhonchi Abdominal: Soft, NT, ND, bowel sounds + Extremities: no edema, no cyanosis  The results of significant diagnostics from this hospitalization (including imaging, microbiology, ancillary and laboratory) are listed below for reference.     Microbiology: Recent Results (from the past 240 hour(s))  Culture, blood (Routine x 2)     Status: Abnormal   Collection Time: 11/04/17 11:06 AM  Result Value Ref Range Status   Specimen Description BLOOD BLOOD RIGHT FOREARM  Final   Special Requests   Final    BOTTLES DRAWN AEROBIC AND ANAEROBIC Blood Culture adequate volume   Culture  Setup Time   Final    GRAM POSITIVE COCCI IN BOTH AEROBIC AND ANAEROBIC BOTTLES CRITICAL RESULT CALLED TO, READ BACK BY AND VERIFIED WITH: Denton Brick [facility] 11/05/17 0026 JDW Performed at [facility], [place] [address], [place], [place] [address]    Culture STAPHYLOCOCCUS AUREUS (A)  Final   Report Status 11/06/2017 FINAL  Final   Organism ID, Bacteria STAPHYLOCOCCUS AUREUS  Final      Susceptibility   Staphylococcus aureus - MIC*    CIPROFLOXACIN <=0.5 SENSITIVE Sensitive     ERYTHROMYCIN >=8 RESISTANT Resistant     GENTAMICIN <=0.5  SENSITIVE Sensitive     OXACILLIN 0.5 SENSITIVE Sensitive     TETRACYCLINE <=1 SENSITIVE Sensitive     VANCOMYCIN 1 SENSITIVE Sensitive     TRIMETH/SULFA <=10 SENSITIVE Sensitive     CLINDAMYCIN RESISTANT Resistant     RIFAMPIN <=0.5 SENSITIVE Sensitive     Inducible Clindamycin POSITIVE Resistant     * STAPHYLOCOCCUS AUREUS  Blood Culture ID Panel (Reflexed)     Status: Abnormal   Collection Time: 11/04/17 11:06 AM  Result Value Ref Range Status   Enterococcus species NOT DETECTED NOT DETECTED Final   Listeria monocytogenes NOT DETECTED NOT DETECTED Final   Staphylococcus species DETECTED (A) NOT DETECTED Final    Comment: CRITICAL RESULT CALLED TO, READ BACK BY AND VERIFIED WITH: G ABBOTT PHARMD 11/05/17 0026 JDW    Staphylococcus aureus DETECTED (A) NOT DETECTED Final    Comment: Methicillin (oxacillin) susceptible Staphylococcus aureus (MSSA). Preferred therapy is anti staphylococcal beta lactam antibiotic (Cefazolin or Nafcillin), unless clinically contraindicated. CRITICAL RESULT CALLED TO, READ BACK BY AND VERIFIED WITH: G ABBOTT PHARMD 11/05/17 0026 JDW    Methicillin resistance NOT DETECTED NOT DETECTED Final   Streptococcus species NOT DETECTED NOT DETECTED Final   Streptococcus agalactiae NOT DETECTED NOT DETECTED Final   Streptococcus pneumoniae NOT DETECTED NOT DETECTED Final   Streptococcus pyogenes NOT DETECTED NOT DETECTED Final   Acinetobacter baumannii NOT DETECTED NOT DETECTED Final   Enterobacteriaceae species NOT DETECTED NOT DETECTED Final   Enterobacter cloacae complex NOT DETECTED NOT DETECTED Final   Escherichia coli NOT DETECTED NOT DETECTED Final   Klebsiella oxytoca NOT DETECTED NOT DETECTED Final   Klebsiella pneumoniae NOT DETECTED NOT DETECTED Final   Proteus species NOT DETECTED NOT DETECTED Final   Serratia marcescens NOT DETECTED NOT DETECTED Final   Haemophilus influenzae NOT DETECTED NOT DETECTED Final   Neisseria meningitidis NOT DETECTED NOT  DETECTED Final   Pseudomonas aeruginosa NOT DETECTED NOT DETECTED Final   Candida albicans NOT DETECTED NOT DETECTED Final   Candida glabrata NOT DETECTED NOT DETECTED Final   Candida krusei NOT DETECTED NOT DETECTED Final   Candida parapsilosis NOT DETECTED NOT DETECTED Final   Candida tropicalis NOT DETECTED NOT DETECTED Final    Comment: Performed at [facility], [place]. [address] [place], [place] [address]  Culture, blood (Routine x 2)     Status: Abnormal   Collection Time: 11/04/17 11:08 AM  Result Value Ref Range Status   Specimen Description BLOOD RIGHT ANTECUBITAL  Final   Special Requests   Final    BOTTLES DRAWN AEROBIC AND ANAEROBIC Blood Culture adequate volume   Culture  Setup Time   Final    IN BOTH AEROBIC AND ANAEROBIC BOTTLES GRAM POSITIVE COCCI CRITICAL RESULT CALLED TO, READ BACK BY AND VERIFIED WITH: Denton Brick [facility] 11/05/17 0026 JDW Performed at [facility], [place] [address] [place], [place] [address]    Culture (A)  Final    STAPHYLOCOCCUS AUREUS SUSCEPTIBILITIES PERFORMED ON PREVIOUS CULTURE WITHIN THE LAST 5 DAYS.    Report Status 11/07/2017 FINAL  Final  Urine culture     Status: Abnormal   Collection Time: 11/04/17 11:09 AM  Result Value Ref Range Status   Specimen Description URINE, CATHETERIZED  Final   Special Requests   Final    NONE Performed at [facility], [address], [place], [place] [address]    Culture 10,000 COLONIES/mL PSEUDOMONAS AERUGINOSA (A)  Final   Report Status 11/06/2017 FINAL  Final   Organism ID, Bacteria PSEUDOMONAS AERUGINOSA (A)  Final      Susceptibility   Pseudomonas aeruginosa - MIC*    CEFTAZIDIME 4 SENSITIVE Sensitive     CIPROFLOXACIN <=0.25 SENSITIVE Sensitive     GENTAMICIN <=1 SENSITIVE Sensitive     IMIPENEM 2 SENSITIVE Sensitive     CEFEPIME 4 SENSITIVE Sensitive     *  10,000 COLONIES/mL PSEUDOMONAS AERUGINOSA  Aerobic Culture (superficial specimen)     Status: None   Collection Time:  11/05/17  1:24 PM  Result Value Ref Range Status   Specimen Description WOUND SCALP  Final   Special Requests NONE  Final   Gram Stain   Final    NO WBC SEEN FEW GRAM POSITIVE COCCI IN PAIRS Performed at [facility], [place] [address], [place], [place] [address]    Culture MODERATE STAPHYLOCOCCUS AUREUS  Final   Report Status 11/08/2017 FINAL  Final   Organism ID, Bacteria STAPHYLOCOCCUS AUREUS  Final      Susceptibility   Staphylococcus aureus - MIC*    CIPROFLOXACIN <=0.5 SENSITIVE Sensitive     ERYTHROMYCIN RESISTANT Resistant     GENTAMICIN <=0.5 SENSITIVE Sensitive     OXACILLIN 0.5 SENSITIVE Sensitive     TETRACYCLINE <=1 SENSITIVE Sensitive     VANCOMYCIN 1 SENSITIVE Sensitive     TRIMETH/SULFA <=10 SENSITIVE Sensitive     CLINDAMYCIN RESISTANT Resistant     RIFAMPIN <=0.5 SENSITIVE Sensitive     Inducible Clindamycin POSITIVE Resistant     * MODERATE STAPHYLOCOCCUS AUREUS  Culture, blood (routine x 2)     Status: None (Preliminary result)   Collection Time: 11/06/17  6:09 AM  Result Value Ref Range Status   Specimen Description BLOOD LEFT HAND  Final   Special Requests   Final    BOTTLES DRAWN AEROBIC AND ANAEROBIC Blood Culture adequate volume   Culture   Final    NO GROWTH 4 DAYS Performed at [facility], [address] [place], [place] [address]    Report Status PENDING  Incomplete  Culture, blood (routine x 2)     Status: None (Preliminary result)   Collection Time: 11/06/17 12:43 PM  Result Value Ref Range Status   Specimen Description BLOOD RIGHT HAND  Final   Special Requests   Final    BOTTLES DRAWN AEROBIC ONLY Blood Culture adequate volume   Culture   Final    NO GROWTH 4 DAYS Performed at [facility], [place] [address] [place], [place] [address]    Report Status PENDING  Incomplete     Labs: BNP (last 3 results) Recent Labs    11/04/17 1105  BNP 751.0*   Basic Metabolic Panel: Recent Labs  Lab 11/04/17 1105 11/05/17 0534  11/06/17 1400 11/08/17 0555  NA 137 134* 139 134*  K 3.8 4.2 3.9 4.2  CL 107 106 109 106  CO2 20* 21* 22 23  GLUCOSE 207* 155* 162* 154*  BUN 28* 28* 27* 22*  CREATININE 1.36* 1.34* 1.04* 1.04*  CALCIUM 8.9 8.5* 8.9 8.6*   Liver Function Tests: Recent Labs  Lab 11/04/17 1105  AST 22  ALT 18  ALKPHOS 105  BILITOT 1.1  PROT 6.1*  ALBUMIN 3.0*   No results for input(s): LIPASE, AMYLASE in the last 168 hours. No results for input(s): AMMONIA in the last 168 hours. CBC: Recent Labs  Lab 11/04/17 1105 11/05/17 0534 11/06/17 0615 11/06/17 1400 11/08/17 0555  WBC 10.8* 5.4 4.7 4.7 5.1  NEUTROABS 9.4*  --   --   --   --   HGB 10.5* 9.9* 9.3* 10.1* 9.3*  HCT 32.3* 30.9* 29.1* 31.8* 29.9*  MCV 98.2 98.7 99.0 98.5 99.0  PLT 93* 82* 75* 95* 107*   Cardiac Enzymes: No results for input(s): CKTOTAL, CKMB, CKMBINDEX, TROPONINI in the last 168 hours. BNP: Invalid input(s):  POCBNP CBG: Recent Labs  Lab 11/09/17 1301 11/09/17 1701 11/09/17 2057 11/10/17 0757 11/10/17 1228  GLUCAP 156* 166* 182* 142* 178*   D-Dimer No results for input(s): DDIMER in the last 72 hours. Hgb A1c No results for input(s): HGBA1C in the last 72 hours. Lipid Profile No results for input(s): CHOL, HDL, LDLCALC, TRIG, CHOLHDL, LDLDIRECT in the last 72 hours. Thyroid function studies No results for input(s): TSH, T4TOTAL, T3FREE, THYROIDAB in the last 72 hours.  Invalid input(s): FREET3 Anemia work up No results for input(s): VITAMINB12, FOLATE, FERRITIN, TIBC, IRON, RETICCTPCT in the last 72 hours. Urinalysis    Component Value Date/Time   COLORURINE YELLOW 11/04/2017 1105   APPEARANCEUR HAZY (A) 11/04/2017 1105   LABSPEC 1.018 11/04/2017 1105   PHURINE 5.0 11/04/2017 1105   GLUCOSEU NEGATIVE 11/04/2017 1105   HGBUR MODERATE (A) 11/04/2017 1105   BILIRUBINUR NEGATIVE 11/04/2017 1105   KETONESUR 20 (A) 11/04/2017 1105   PROTEINUR >=300 (A) 11/04/2017 1105   UROBILINOGEN 1.0 07/28/2013  1305   NITRITE NEGATIVE 11/04/2017 1105   LEUKOCYTESUR TRACE (A) 11/04/2017 1105   Sepsis Labs Invalid input(s): PROCALCITONIN,  WBC,  LACTICIDVEN   Time coordinating discharge: 45 minutes  SIGNED:  Marzetta Board, MD  [facility] 11/10/2017, 2:25 PM Pager [phone]  If 7PM-7AM, please contact night-coverage [email or web address] Password [email or web address]

## 2017-11-10 NOTE — Progress Notes (Signed)
D/c with family home. VSS. Pain denied breathing regular and unlabored on room air. D/c instructions and prescriptions  given and explained with understating.

## 2017-11-10 NOTE — Progress Notes (Signed)
Occupational Therapy Evaluation Patient Details Name: Carrie Monroe MRN: [id number] DOB: [birth year] Today's Date: 11/10/2017    History of Present Illness Pt is a 82 y/o female admitted secondary to fevers and AMS secondary to infection in scalp wound. Pt with recent removal of squamos cell carcinoma. Pt is also s/p TEE on 6/13. PMH includes DM, dCHF, HTN, CKD, CAD s/p stent placement, and s/p TAVR.    Clinical Impression   PTA, pt was living at home alone and was independent with ADLs and modified independent with functional mobility with intermittent use of cane and RW. PTA pt was driving with intermittent S from daughter to ensure pt was appropriate and safe to drive. Pt scored 25/30 on MOCA (Normal>=26/30) demonstrating difficulty with language fluency and abstraction. Pt currently requires minA with LB dressing and minguard with functional mobility during ADLs at RW level. All education complete and pt has no further questions, all further OT concerns can be addressed in d/c venue. At this time, recommend HHOT follow-up.      Follow Up Recommendations  Home health OT;Supervision - Intermittent    Equipment Recommendations  None recommended by OT    Recommendations for Other Services       Precautions / Restrictions Precautions Precautions: Fall Restrictions Weight Bearing Restrictions: No      Mobility Bed Mobility               General bed mobility comments: sitting in recliner upon arrival  Transfers Overall transfer level: Needs assistance Equipment used: Rolling walker (2 wheeled) Transfers: Sit to/from [facility] Sit to Stand: Min guard Stand pivot transfers: Min guard       General transfer comment: minguard for steadying assist. VC for safe hand placement.     Balance Overall balance assessment: Needs assistance Sitting-balance support: No upper extremity supported;Feet supported Sitting balance-Leahy Scale: Fair     Standing  balance support: Bilateral upper extremity supported;During functional activity Standing balance-Leahy Scale: Fair Standing balance comment: Reliant on BUE support.                            ADL either performed or assessed with clinical judgement   ADL Overall ADL's : Needs assistance/impaired Eating/Feeding: Supervision/ safety   Grooming: Min guard;Standing   Upper Body Bathing: Supervision/ safety   Lower Body Bathing: Min guard;Sit to/from stand   Upper Body Dressing : Min guard   Lower Body Dressing: Minimal assistance Lower Body Dressing Details (indicate cue type and reason): able to reach down to adjust socks;expressed difficulty donning shoes Toilet Transfer: Min guard   Toileting- Clothing Manipulation and Hygiene: Min guard;Sit to/from stand Toileting - Clothing Manipulation Details (indicate cue type and reason): reports difficulty reaching to thoroughly complete pericare  Tub/ Shower Transfer: Min guard;Grab bars   Functional mobility during ADLs: Min guard;Rolling walker(min guard for steadying and VC for safe hand placement) General ADL Comments: began education on use of AE to assist with ADL;     Vision Baseline Vision/History: Wears glasses Wears Glasses: Reading only Patient Visual Report: No change from baseline       Perception     Praxis      Pertinent Vitals/Pain Pain Assessment: 0-10 Pain Score: 3  Pain Location: gout in LE Pain Descriptors / Indicators: Discomfort Pain Intervention(s): Limited activity within patient's tolerance     Hand Dominance Right   Extremity/Trunk Assessment Upper Extremity Assessment Upper Extremity Assessment: Generalized weakness;RUE deficits/detail  RUE Deficits / Details: unable to reach top of head;had difficulty with washing hair at baseline RUE Coordination: decreased fine motor;decreased gross motor   Lower Extremity Assessment Lower Extremity Assessment: Defer to PT evaluation   Cervical  / Trunk Assessment Cervical / Trunk Assessment: Normal   Communication Communication Communication: No difficulties   Cognition Arousal/Alertness: Awake/alert Behavior During Therapy: WFL for tasks assessed/performed Overall Cognitive Status: Within Functional Limits for tasks assessed                                 General Comments: scored 25/30 on MOCA (Normal>=26/30);difficulty with fluency (3words in 60sec) and abstraction (1/2 similarity);pt required extended time for memory and delayed recall but was able to recall 4/5 words no cue and 1 category cue for 'church';   General Comments  pt's daughter in room during session;educated pt/daughter on importance of intermittent S with driving;    Exercises     Shoulder Instructions      Home Living Family/patient expects to be discharged to:: Private residence Living Arrangements: Alone Available Help at Discharge: Family;Available 24 hours/day(reports daughter coming in to stay for a week. ) Type of Home: House Home Access: Ramped entrance     Home Layout: One level     Bathroom Shower/Tub: Occupational psychologist: Handicapped height     Home Equipment: Bedside commode;Grab bars - tub/shower;Wheelchair - [employer] - 2 wheels;Cane - single point   Additional Comments: husband lives in nursing home      Prior Functioning/Environment Level of Independence: Independent with assistive device(s)        Comments: Uses RW for longer distance ambulation;long handle shoe horn;Still driving.;goes to beauty salon for hair        OT Problem List: Decreased strength;Decreased range of motion;Decreased activity tolerance;Impaired balance (sitting and/or standing);Decreased cognition;Impaired UE functional use      OT Treatment/Interventions: Self-care/ADL training;Therapeutic exercise;Energy conservation;DME and/or AE instruction;Therapeutic activities;Patient/family education;Balance training    OT  Goals(Current goals can be found in the care plan section) Acute Rehab OT Goals Patient Stated Goal: to go home  OT Goal Formulation: With patient/family Time For Goal Achievement: 11/24/17 Potential to Achieve Goals: Good  OT Frequency: Min 2X/week   Barriers to D/C:            Co-evaluation              AM-PAC PT "6 Clicks" Daily Activity     Outcome Measure Help from another person eating meals?: None Help from another person taking care of personal grooming?: A Little Help from another person toileting, which includes using toliet, bedpan, or urinal?: A Little Help from another person bathing (including washing, rinsing, drying)?: A Little Help from another person to put on and taking off regular upper body clothing?: A Little Help from another person to put on and taking off regular lower body clothing?: A Little 6 Click Score: 19   End of Session Equipment Utilized During Treatment: Gait belt;Rolling walker Nurse Communication: Mobility status  Activity Tolerance: Patient tolerated treatment well Patient left: in chair;with call bell/phone within reach;with family/visitor present  OT Visit Diagnosis: Unsteadiness on feet (R26.81);Other abnormalities of gait and mobility (R26.89);Muscle weakness (generalized) (M62.81)                Time: 6834-1962 OT Time Calculation (min): 37 min Charges:    G-Codes:     Dorinda Hill OTS  Dorinda Hill 11/10/2017, 12:46 PM

## 2017-11-11 ENCOUNTER — Encounter (HOSPITAL_COMMUNITY)
Admit: 2017-11-11 | Discharge: 2017-11-11 | Disposition: A | Discharge status: Home / Self Care | Payer: Medicare Other | Source: Ambulatory Visit | Attending: Internal Medicine | Admitting: Internal Medicine

## 2017-11-11 ENCOUNTER — Other Ambulatory Visit (HOSPITAL_COMMUNITY): Payer: Self-pay | Admitting: *Deleted

## 2017-11-11 DIAGNOSIS — R7881 Bacteremia: Secondary | ICD-10-CM | POA: Diagnosis not present

## 2017-11-11 LAB — CBC WITH DIFFERENTIAL/PLATELET
Abs Immature Granulocytes: 0.1 10*3/uL (ref 0.0–0.1)
BASOS ABS: 0 10*3/uL (ref 0.0–0.1)
BASOS PCT: 1 %
EOS ABS: 0.3 10*3/uL (ref 0.0–0.7)
Eosinophils Relative: 5 %
HCT: 29.5 % — ABNORMAL LOW (ref 36.0–46.0)
Hemoglobin: 9.1 g/dL — ABNORMAL LOW (ref 12.0–15.0)
IMMATURE GRANULOCYTES: 1 %
Lymphocytes Relative: 17 %
Lymphs Abs: 1 10*3/uL (ref 0.7–4.0)
MCH: 31.6 pg (ref 26.0–34.0)
MCHC: 30.8 g/dL (ref 30.0–36.0)
MCV: 102.4 fL — AB (ref 78.0–100.0)
MONO ABS: 0.3 10*3/uL (ref 0.1–1.0)
Monocytes Relative: 5 %
NEUTROS PCT: 71 %
Neutro Abs: 4.4 10*3/uL (ref 1.7–7.7)
PLATELETS: 121 10*3/uL — AB (ref 150–400)
RBC: 2.88 MIL/uL — ABNORMAL LOW (ref 3.87–5.11)
RDW: 14.3 % (ref 11.5–15.5)
WBC: 6.2 10*3/uL (ref 4.0–10.5)

## 2017-11-11 LAB — COMPREHENSIVE METABOLIC PANEL
ALT: 6 U/L — ABNORMAL LOW (ref 14–54)
ANION GAP: 4 — AB (ref 5–15)
AST: 18 U/L (ref 15–41)
Albumin: 2.6 g/dL — ABNORMAL LOW (ref 3.5–5.0)
Alkaline Phosphatase: 91 U/L (ref 38–126)
BILIRUBIN TOTAL: 0.6 mg/dL (ref 0.3–1.2)
BUN: 24 mg/dL — ABNORMAL HIGH (ref 6–20)
CO2: 24 mmol/L (ref 22–32)
Calcium: 9.2 mg/dL (ref 8.9–10.3)
Chloride: 110 mmol/L (ref 101–111)
Creatinine, Ser: 1.12 mg/dL — ABNORMAL HIGH (ref 0.44–1.00)
GFR calc Af Amer: 53 mL/min — ABNORMAL LOW (ref >60)
GFR, EST NON AFRICAN AMERICAN: 45 mL/min — AB (ref >60)
Glucose, Bld: 141 mg/dL — ABNORMAL HIGH (ref 65–99)
POTASSIUM: 4.4 mmol/L (ref 3.5–5.1)
Sodium: 138 mmol/L (ref 135–145)
TOTAL PROTEIN: 5.9 g/dL — AB (ref 6.5–8.1)

## 2017-11-11 LAB — CULTURE, BLOOD (ROUTINE X 2)
CULTURE: NO GROWTH
Culture: NO GROWTH
SPECIAL REQUESTS: ADEQUATE
SPECIAL REQUESTS: ADEQUATE

## 2017-11-11 LAB — C-REACTIVE PROTEIN

## 2017-11-11 LAB — SEDIMENTATION RATE: SED RATE: 31 mm/h — AB (ref 0–22)

## 2017-11-11 LAB — CK: CK TOTAL: 20 U/L — AB (ref 38–234)

## 2017-11-11 MED ORDER — HEPARIN SOD (PORK) LOCK FLUSH 100 UNIT/ML IV SOLN
INTRAVENOUS | Status: AC
  Filled 2017-11-11: qty 5

## 2017-11-11 MED ORDER — SODIUM CHLORIDE 0.9 % IV SOLN
800.0000 mg | INTRAVENOUS | Status: DC
  Administered 2017-11-11: 800 mg via INTRAVENOUS
  Filled 2017-11-11: qty 16

## 2017-11-11 MED ORDER — HEPARIN SOD (PORK) LOCK FLUSH 100 UNIT/ML IV SOLN
250.0000 [IU] | INTRAVENOUS | Status: DC | PRN
  Administered 2017-11-11: 250 [IU]

## 2017-11-12 ENCOUNTER — Ambulatory Visit (HOSPITAL_COMMUNITY)
Admit: 2017-11-12 | Discharge: 2017-11-12 | Disposition: A | Discharge status: Home / Self Care | Payer: Medicare Other | Source: Ambulatory Visit | Attending: Internal Medicine | Admitting: Internal Medicine

## 2017-11-12 DIAGNOSIS — R7881 Bacteremia: Secondary | ICD-10-CM | POA: Insufficient documentation

## 2017-11-12 MED ORDER — HEPARIN SOD (PORK) LOCK FLUSH 100 UNIT/ML IV SOLN
250.0000 [IU] | INTRAVENOUS | Status: DC | PRN
  Administered 2017-11-12: 250 [IU]

## 2017-11-12 MED ORDER — HEPARIN SOD (PORK) LOCK FLUSH 100 UNIT/ML IV SOLN
INTRAVENOUS | Status: AC
  Filled 2017-11-12: qty 5

## 2017-11-12 MED ORDER — SODIUM CHLORIDE 0.9 % IV SOLN
800.0000 mg | INTRAVENOUS | Status: DC
  Administered 2017-11-12: 800 mg via INTRAVENOUS
  Filled 2017-11-12: qty 16

## 2017-11-13 ENCOUNTER — Ambulatory Visit (HOSPITAL_COMMUNITY)
Admit: 2017-11-13 | Discharge: 2017-11-13 | Disposition: A | Discharge status: Home / Self Care | Payer: Medicare Other | Attending: Internal Medicine | Admitting: Internal Medicine

## 2017-11-13 DIAGNOSIS — R7881 Bacteremia: Secondary | ICD-10-CM | POA: Insufficient documentation

## 2017-11-13 MED ORDER — SODIUM CHLORIDE 0.9 % IV SOLN
800.0000 mg | INTRAVENOUS | Status: DC
  Administered 2017-11-13: 800 mg via INTRAVENOUS
  Filled 2017-11-13: qty 16

## 2017-11-13 MED ORDER — HEPARIN SOD (PORK) LOCK FLUSH 100 UNIT/ML IV SOLN
INTRAVENOUS | Status: AC
  Administered 2017-11-13: 250 [IU]
  Filled 2017-11-13: qty 5

## 2017-11-13 MED ORDER — HEPARIN SOD (PORK) LOCK FLUSH 100 UNIT/ML IV SOLN
250.0000 [IU] | INTRAVENOUS | Status: DC | PRN
  Administered 2017-11-13: 250 [IU]

## 2017-11-14 ENCOUNTER — Ambulatory Visit (HOSPITAL_COMMUNITY)
Admission: RE | Admit: 2017-11-14 | Discharge: 2017-11-14 | Disposition: A | Discharge status: Home / Self Care | Payer: Medicare Other | Source: Ambulatory Visit | Attending: Internal Medicine | Admitting: Internal Medicine

## 2017-11-14 DIAGNOSIS — A4901 Methicillin susceptible Staphylococcus aureus infection, unspecified site: Secondary | ICD-10-CM | POA: Insufficient documentation

## 2017-11-14 MED ORDER — HEPARIN SOD (PORK) LOCK FLUSH 100 UNIT/ML IV SOLN
INTRAVENOUS | Status: AC
  Administered 2017-11-14: 250 [IU]
  Filled 2017-11-14: qty 5

## 2017-11-14 MED ORDER — HEPARIN SOD (PORK) LOCK FLUSH 100 UNIT/ML IV SOLN
250.0000 [IU] | INTRAVENOUS | Status: DC | PRN
  Administered 2017-11-14: 250 [IU]

## 2017-11-14 MED ORDER — DAPTOMYCIN 500 MG IV SOLR
800.0000 mg | INTRAVENOUS | Status: DC
  Administered 2017-11-14: 800 mg via INTRAVENOUS
  Filled 2017-11-14: qty 16

## 2017-11-15 ENCOUNTER — Ambulatory Visit (HOSPITAL_COMMUNITY)
Admission: RE | Admit: 2017-11-15 | Discharge: 2017-11-15 | Disposition: A | Discharge status: Home / Self Care | Payer: Medicare Other | Source: Ambulatory Visit | Attending: Internal Medicine | Admitting: Internal Medicine

## 2017-11-15 DIAGNOSIS — A4901 Methicillin susceptible Staphylococcus aureus infection, unspecified site: Secondary | ICD-10-CM | POA: Diagnosis present

## 2017-11-15 MED ORDER — HEPARIN SOD (PORK) LOCK FLUSH 100 UNIT/ML IV SOLN
250.0000 [IU] | INTRAVENOUS | Status: DC | PRN
  Administered 2017-11-15: 250 [IU]

## 2017-11-15 MED ORDER — DAPTOMYCIN 500 MG IV SOLR
800.0000 mg | INTRAVENOUS | Status: DC
  Administered 2017-11-15: 800 mg via INTRAVENOUS
  Filled 2017-11-15: qty 16

## 2017-11-15 MED ORDER — HEPARIN SOD (PORK) LOCK FLUSH 100 UNIT/ML IV SOLN
INTRAVENOUS | Status: AC
  Administered 2017-11-15: 250 [IU]
  Filled 2017-11-15: qty 5

## 2017-11-16 ENCOUNTER — Ambulatory Visit (HOSPITAL_COMMUNITY)
Admission: RE | Admit: 2017-11-16 | Discharge: 2017-11-16 | Disposition: A | Discharge status: Home / Self Care | Payer: Medicare Other | Source: Ambulatory Visit | Attending: Internal Medicine | Admitting: Internal Medicine

## 2017-11-16 DIAGNOSIS — R7881 Bacteremia: Secondary | ICD-10-CM | POA: Diagnosis not present

## 2017-11-16 DIAGNOSIS — A4902 Methicillin resistant Staphylococcus aureus infection, unspecified site: Secondary | ICD-10-CM | POA: Diagnosis present

## 2017-11-16 MED ORDER — SODIUM CHLORIDE 0.9 % IV SOLN
800.0000 mg | INTRAVENOUS | Status: DC
  Administered 2017-11-16: 800 mg via INTRAVENOUS
  Filled 2017-11-16: qty 16

## 2017-11-16 MED ORDER — HEPARIN SOD (PORK) LOCK FLUSH 100 UNIT/ML IV SOLN
250.0000 [IU] | INTRAVENOUS | Status: DC | PRN
  Administered 2017-11-16: 250 [IU]

## 2017-11-17 ENCOUNTER — Encounter (HOSPITAL_COMMUNITY)
Admission: RE | Admit: 2017-11-17 | Discharge: 2017-11-17 | Disposition: A | Discharge status: Home / Self Care | Payer: Medicare Other | Source: Ambulatory Visit | Attending: Internal Medicine | Admitting: Internal Medicine

## 2017-11-17 DIAGNOSIS — R7881 Bacteremia: Secondary | ICD-10-CM | POA: Diagnosis not present

## 2017-11-17 MED ORDER — SODIUM CHLORIDE 0.9 % IV SOLN
800.0000 mg | INTRAVENOUS | Status: DC
  Filled 2017-11-17: qty 16

## 2017-11-17 MED ORDER — HEPARIN SOD (PORK) LOCK FLUSH 100 UNIT/ML IV SOLN: 250.0000 [IU] | INTRAVENOUS | Status: DC | PRN

## 2017-11-18 ENCOUNTER — Ambulatory Visit (HOSPITAL_COMMUNITY)
Admit: 2017-11-18 | Discharge: 2017-11-18 | Disposition: A | Discharge status: Home / Self Care | Payer: Medicare Other | Attending: Internal Medicine | Admitting: Internal Medicine

## 2017-11-18 DIAGNOSIS — E119 Type 2 diabetes mellitus without complications: Secondary | ICD-10-CM | POA: Insufficient documentation

## 2017-11-18 DIAGNOSIS — A4901 Methicillin susceptible Staphylococcus aureus infection, unspecified site: Secondary | ICD-10-CM | POA: Insufficient documentation

## 2017-11-18 LAB — COMPREHENSIVE METABOLIC PANEL
ALT: 12 U/L — ABNORMAL LOW (ref 14–54)
ANION GAP: 10 (ref 5–15)
AST: 23 U/L (ref 15–41)
Albumin: 3.1 g/dL — ABNORMAL LOW (ref 3.5–5.0)
Alkaline Phosphatase: 96 U/L (ref 38–126)
BILIRUBIN TOTAL: 1.1 mg/dL (ref 0.3–1.2)
BUN: 17 mg/dL (ref 6–20)
CO2: 25 mmol/L (ref 22–32)
Calcium: 9.4 mg/dL (ref 8.9–10.3)
Chloride: 105 mmol/L (ref 101–111)
Creatinine, Ser: 1.33 mg/dL — ABNORMAL HIGH (ref 0.44–1.00)
GFR calc Af Amer: 43 mL/min — ABNORMAL LOW (ref >60)
GFR, EST NON AFRICAN AMERICAN: 37 mL/min — AB (ref >60)
Glucose, Bld: 152 mg/dL — ABNORMAL HIGH (ref 65–99)
Potassium: 4.1 mmol/L (ref 3.5–5.1)
Sodium: 140 mmol/L (ref 135–145)
TOTAL PROTEIN: 6.4 g/dL — AB (ref 6.5–8.1)

## 2017-11-18 LAB — CK: Total CK: 19 U/L — ABNORMAL LOW (ref 38–234)

## 2017-11-18 LAB — CBC WITH DIFFERENTIAL/PLATELET
ABS IMMATURE GRANULOCYTES: 0 10*3/uL (ref 0.0–0.1)
BASOS ABS: 0 10*3/uL (ref 0.0–0.1)
Basophils Relative: 1 %
Eosinophils Absolute: 0.1 10*3/uL (ref 0.0–0.7)
Eosinophils Relative: 2 %
HCT: 30.7 % — ABNORMAL LOW (ref 36.0–46.0)
HEMOGLOBIN: 9.5 g/dL — AB (ref 12.0–15.0)
Immature Granulocytes: 0 %
LYMPHS PCT: 20 %
Lymphs Abs: 0.9 10*3/uL (ref 0.7–4.0)
MCH: 31.8 pg (ref 26.0–34.0)
MCHC: 30.9 g/dL (ref 30.0–36.0)
MCV: 102.7 fL — ABNORMAL HIGH (ref 78.0–100.0)
Monocytes Absolute: 0.3 10*3/uL (ref 0.1–1.0)
Monocytes Relative: 8 %
NEUTROS ABS: 3.1 10*3/uL (ref 1.7–7.7)
NEUTROS PCT: 69 %
Platelets: 120 10*3/uL — ABNORMAL LOW (ref 150–400)
RBC: 2.99 MIL/uL — AB (ref 3.87–5.11)
RDW: 14.5 % (ref 11.5–15.5)
WBC: 4.5 10*3/uL (ref 4.0–10.5)

## 2017-11-18 MED ORDER — HEPARIN SOD (PORK) LOCK FLUSH 100 UNIT/ML IV SOLN: 250.0000 [IU] | Freq: Every day | INTRAVENOUS | Status: DC

## 2017-11-18 MED ORDER — HEPARIN SOD (PORK) LOCK FLUSH 100 UNIT/ML IV SOLN
250.0000 [IU] | INTRAVENOUS | Status: DC | PRN
  Administered 2017-11-18: 250 [IU]

## 2017-11-18 MED ORDER — HEPARIN SOD (PORK) LOCK FLUSH 100 UNIT/ML IV SOLN
INTRAVENOUS | Status: AC
  Filled 2017-11-18: qty 5

## 2017-11-18 MED ORDER — SODIUM CHLORIDE 0.9 % IV SOLN
800.0000 mg | INTRAVENOUS | Status: DC
  Administered 2017-11-18: 800 mg via INTRAVENOUS
  Filled 2017-11-18: qty 16

## 2017-11-19 ENCOUNTER — Ambulatory Visit (HOSPITAL_COMMUNITY)
Admit: 2017-11-19 | Discharge: 2017-11-19 | Disposition: A | Discharge status: Home / Self Care | Payer: Medicare Other | Source: Ambulatory Visit | Attending: Internal Medicine | Admitting: Internal Medicine

## 2017-11-19 DIAGNOSIS — R7881 Bacteremia: Secondary | ICD-10-CM | POA: Diagnosis not present

## 2017-11-19 MED ORDER — HEPARIN SOD (PORK) LOCK FLUSH 100 UNIT/ML IV SOLN: 250.0000 [IU] | Freq: Every day | INTRAVENOUS | Status: DC

## 2017-11-19 MED ORDER — HEPARIN SOD (PORK) LOCK FLUSH 100 UNIT/ML IV SOLN
INTRAVENOUS | Status: AC
  Filled 2017-11-19: qty 5

## 2017-11-19 MED ORDER — SODIUM CHLORIDE 0.9 % IV SOLN
800.0000 mg | INTRAVENOUS | Status: DC
  Administered 2017-11-19: 800 mg via INTRAVENOUS
  Filled 2017-11-19: qty 16

## 2017-11-19 MED ORDER — HEPARIN SOD (PORK) LOCK FLUSH 100 UNIT/ML IV SOLN: 250.0000 [IU] | INTRAVENOUS | Status: DC | PRN

## 2017-11-19 NOTE — Progress Notes (Signed)
picc line dressing to right upper arm with small amount of old drainage from initial removal of picc line.  Site remains stable and has not bleed anymore since initial marking.  Okay for DC home without complaint.

## 2017-11-27 ENCOUNTER — Encounter: Payer: Self-pay | Admitting: Infectious Diseases

## 2017-11-27 ENCOUNTER — Ambulatory Visit (INDEPENDENT_AMBULATORY_CARE_PROVIDER_SITE_OTHER): Payer: Medicare Other | Admitting: Infectious Diseases

## 2017-11-27 VITALS — BP 178/70 | HR 77 | Temp 98.6°F | Wt 225.0 lb

## 2017-11-27 DIAGNOSIS — Z5181 Encounter for therapeutic drug level monitoring: Secondary | ICD-10-CM

## 2017-11-27 DIAGNOSIS — R7881 Bacteremia: Secondary | ICD-10-CM

## 2017-11-27 DIAGNOSIS — B9561 Methicillin susceptible Staphylococcus aureus infection as the cause of diseases classified elsewhere: Secondary | ICD-10-CM

## 2017-11-27 DIAGNOSIS — Z7189 Other specified counseling: Secondary | ICD-10-CM | POA: Insufficient documentation

## 2017-11-27 DIAGNOSIS — Z515 Encounter for palliative care: Secondary | ICD-10-CM | POA: Insufficient documentation

## 2017-11-27 DIAGNOSIS — Z452 Encounter for adjustment and management of vascular access device: Secondary | ICD-10-CM | POA: Diagnosis not present

## 2017-11-27 NOTE — Patient Instructions (Signed)
Nice to see you are doing well!   OK to continue with your smoothies, collagen and zinc.  Will repeat your blood cultures today to check for bacteria - will call you with results. Will take 7 days to finalize.   Continue seeing Dr. Marcello Moores for your scalp wound - this looks like it is filling in very well and nice a healthy today.   You can come back here as needed for now.

## 2017-11-27 NOTE — Assessment & Plan Note (Signed)
Labs monitored and reviewed personally throughout therapy - CK stable, kidney function WBC and platelets stable throughout therapy.

## 2017-11-27 NOTE — Progress Notes (Signed)
Patient: Carrie Monroe  DOB: April 12, 1936 MRN: [id number] PCP: Marton Redwood, MD  Referring Provider: [facility]   Patient Active Problem List   Diagnosis Date Noted  . Wound infection after surgery 11/04/2017    Priority: High  . 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    Priority: Medium  . Encounter for medication monitoring 11/27/2017  . PICC (peripherally inserted central catheter) in place 11/27/2017  . MSSA bacteremia 11/05/2017  . Anemia 09/27/2017  . Hyperlipidemia   . History of kidney stones   . Hemorrhoids   . Exogenous obesity   . Chronic diastolic CHF (congestive heart failure) ([place])   . Arthritis   . Aortic stenosis, severe   . Acute respiratory failure with hypoxia ([place])   . Hemorrhagic shock ([place])   . Carotid artery stenosis   . Coronary artery disease   . Chronic renal insufficiency, stage III (moderate) ([place]) 12/22/2016  . Dental abscess- s/p multiple tooth extraction 12/21/16 12/22/2016  . Essential hypertension 01/27/2016  . Morbid obesity due to excess calories ([place]) 01/27/2016  . Gout 06/22/2013  . Dyslipidemia 05/08/2011  . Diabetes mellitus type 2 in obese ([place]) 05/08/2011  . Severe aortic stenosis 05/08/2011  . Breast cancer of upper-outer quadrant of left female breast ([place]) 03/16/2011     Subjective:  [name] is a 82 y.o. here for hospital follow up after treatment for MSSA bacteremia in June 2019.   She was admitted with fevers and AMS following recent excision of SCC from her scalp. Found to have MSSA bacteremia in 2/2 cultures; antibiotics were narrowed to cefazolin. TEE was pursued with history of TAVR prosthetic valve without evidence of vegetation. No other signs of metastatic infection. Source identified to be due to scalp infection. She did require further debridement of this surgical wound inpatient and cultures were positive for same MSSA isolate. Appears she also was troubled with bleeding post op. She  was discharged on daily daptomycin infusion that was administered through [facility]. Last dose 11/18/17.   Since discharge she has finally started to feel better. Antibiotic administration went very well and had no trouble with her picc line. She says her scalp wound has healed up nicely and she just saw her surgeon today. She has been using collagen and zinc to help heal this. Denies any fevers, chills. No new complaints today. She is diabetic - meter is broken however and cannot tell me how her sugars have been lately.   Review of Systems  All other systems reviewed and are negative.   Past Medical History:  Diagnosis Date  . Aortic stenosis, severe    a. 01/2017: s/p TAVR; hospital course complicated by large groin hematoma/wound  . Arthritis    "fingers" (11/06/2017)  . Cancer of left breast ([place]) 2009   s/p lumpectomy and XRT  . Carotid artery stenosis    a. 11/2016: 80-99% RICA stenosis, 1-39% vs low range 74-12% LICA   . Chronic diastolic CHF (congestive heart failure) ([place])   . CKD (chronic kidney disease)   . Coronary artery disease    a. 11/2016: diagnosed with multivessel CAD, turned down for CABG and underwent PCI/DES to mLCx, PCI/DES to mLAD and PCTA of ostial diagonal on 12/28/16  . Diabetes mellitus type 2, diet-controlled ([place])   . Exogenous obesity   . Gout    "on daily RX" (11/06/2017)  . Heart murmur   . Hemorrhoids   . History of blood transfusion 01/2017   "  28 pints"  . History of kidney stones   . Hyperlipidemia   . Hypertension   . 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 Squamous carcinoma 10/2017   "scalp"    Outpatient Medications Prior to Visit  Medication Sig Dispense Refill  . allopurinol (ZYLOPRIM) 300 MG tablet Take 300 mg by mouth daily.     Marland Kitchen aspirin 81 MG chewable tablet Chew 1 tablet (81 mg total) by  mouth daily.    . Cholecalciferol (VITAMIN D PO) Take 1 capsule by mouth daily.    . clopidogrel (PLAVIX) 75 MG tablet Take 1 tablet (75 mg total) by mouth daily with breakfast. 30 tablet 6  . COLCRYS 0.6 MG tablet Take 0.6 mg by mouth daily.   0  . COLLAGEN PO Take by mouth daily. power     . ferrous sulfate (FERROUSUL) 325 (65 FE) MG tablet Take 1 tablet (325 mg total) 3 (three) times daily with meals by mouth. (Patient taking differently: Take 325 mg by mouth daily with breakfast. ) 90 tablet 0  . furosemide (LASIX) 40 MG tablet Take 1 tablet (40 mg total) by mouth daily as needed for fluid or edema. 90 tablet 1  . hydrALAZINE (APRESOLINE) 50 MG tablet Take 50 mg by mouth every 8 (eight) hours.     . metoprolol tartrate (LOPRESSOR) 25 MG tablet Take 0.5 tablets (12.5 mg total) by mouth 2 (two) times daily. 90 tablet 3  . rosuvastatin (CRESTOR) 10 MG tablet Take 10 mg by mouth daily.   0  . daptomycin (CUBICIN) IVPB Inject 800 mg into the vein daily. Indication:  bacteremia Last Day of Therapy:  11/19/17 Labs - Once weekly:  CBC/D, BMP, and CPK Labs - Every other week:  ESR and CRP (Patient not taking: Reported on 11/27/2017) 12 Units 0  . traMADol (ULTRAM) 50 MG tablet Take 50 mg by mouth every 6 (six) hours as needed.  0   No facility-administered medications prior to visit.      Allergies  Allergen Reactions  . Oxycodone Other (See Comments)    Hallunication    Social History   Tobacco Use  . Smoking status: Never Smoker  . Smokeless tobacco: Never Used  Substance Use Topics  . Alcohol use: Never    Frequency: Never  . Drug use: Never    Family History  Problem Relation Age of Onset  . Cancer Mother        pancreatic  . Heart disease Father   . Hypertension Father   . Cancer Brother   . Diabetes Sister     Objective:   Vitals:   11/27/17 1348  BP: (!) 178/70  Pulse: 77  Temp: 98.6 F (37 C)  TempSrc: Oral  Weight: 225 lb (102.1 kg)   Body mass index is 39.86  kg/m.  Physical Exam  Constitutional: She is oriented to person, place, and time. She appears well-developed and well-nourished.  Seated comfortably in chair.   HENT:  Mouth/Throat: Mucous membranes are normal. No oral lesions. Normal dentition. No dental abscesses. No oropharyngeal exudate.  Longitudinal incision on the top of her head. Wound bed with pink granulation tissue. Peri-wound is without erythema or edema. No drainage.   Cardiovascular: Normal rate and regular rhythm.  Murmur (systolic 2/6) heard. Pulmonary/Chest: Effort normal and breath sounds normal.  Abdominal: Soft. She exhibits no distension. There is  no tenderness.  Musculoskeletal:  Previous R PICC insertion site is well healed   Lymphadenopathy:    She has no cervical adenopathy.  Neurological: She is alert and oriented to person, place, and time.  Skin: Skin is warm and dry. No rash noted.  Psychiatric: She has a normal mood and affect. Judgment normal.  In good spirits today and engaged in care discussion    Lab Results: Lab Results  Component Value Date   WBC 4.5 11/18/2017   HGB 9.5 (L) 11/18/2017   HCT 30.7 (L) 11/18/2017   MCV 102.7 (H) 11/18/2017   PLT 120 (L) 11/18/2017    Lab Results  Component Value Date   CREATININE 1.33 (H) 11/18/2017   BUN 17 11/18/2017   NA 140 11/18/2017   K 4.1 11/18/2017   CL 105 11/18/2017   CO2 25 11/18/2017    Lab Results  Component Value Date   ALT 12 (L) 11/18/2017   AST 23 11/18/2017   ALKPHOS 96 11/18/2017   BILITOT 1.1 11/18/2017     Assessment & Plan:   Problem List Items Addressed This Visit      Other   PICC (peripherally inserted central catheter) in place    Line removed - site unremarkable and healed.       MSSA bacteremia - Primary    Secondary bacteremia in setting of surgical site infection of scalp. Completed therapy at the end of June. No signs of relapsing infection on exam today. With prosthetic valve will check surveillance blood  cultures today.       Relevant Orders   Culture, blood (single)   Culture, blood (single)   Encounter for medication monitoring    Labs monitored and reviewed personally throughout therapy - CK stable, kidney function WBC and platelets stable throughout therapy.         [name] can follow up with dermatology, primary care and cardiology team for ongoing care. Will call with final result of blood cultures. Follow up here as needed.   Janene Madeira, MSN, NP-C [facility] [place] Pager: [phone] Office: [phone]  11/27/17  2:33 PM

## 2017-11-27 NOTE — Assessment & Plan Note (Signed)
Line removed - site unremarkable and healed.

## 2017-11-27 NOTE — Assessment & Plan Note (Signed)
Secondary bacteremia in setting of surgical site infection of scalp. Completed therapy at the end of June. No signs of relapsing infection on exam today. With prosthetic valve will check surveillance blood cultures today.

## 2017-12-03 ENCOUNTER — Telehealth: Payer: Self-pay | Admitting: Behavioral Health

## 2017-12-03 LAB — CULTURE, BLOOD (SINGLE)
MICRO NUMBER:: 90793447
MICRO NUMBER:: 90793459
RESULT: NO GROWTH
Result:: NO GROWTH
SPECIMEN QUALITY: ADEQUATE
SPECIMEN QUALITY:: ADEQUATE

## 2017-12-03 NOTE — Telephone Encounter (Signed)
-----   Message from Clyde Callas, NP sent at 12/03/2017  9:12 AM EDT ----- Repeat blood cultures are both negative and mean that there is no signs of remaining infection in blood stream after her treatment. Please call patient to inform of results. No further follow up needed with ID team.  Thank you!

## 2017-12-03 NOTE — Telephone Encounter (Signed)
Called patient per Carrie Madeira NP and informed her that repeat blood cultures are negative.  Explained to her that this means that there is no sign of remaining infection in her blood.  She does not need to follow up with the ID team.  Patient was appreciative and verbalized understanding.  Patient had no further questions or concerns. Pricilla Riffle RN

## 2017-12-03 NOTE — Telephone Encounter (Signed)
Thank you very much, [name].

## 2018-01-06 ENCOUNTER — Encounter (HOSPITAL_COMMUNITY): Payer: Self-pay

## 2018-01-06 ENCOUNTER — Ambulatory Visit: Payer: Self-pay | Admitting: Surgery

## 2018-01-06 ENCOUNTER — Other Ambulatory Visit: Payer: Self-pay

## 2018-01-10 ENCOUNTER — Telehealth: Payer: Self-pay

## 2018-01-10 NOTE — Telephone Encounter (Signed)
I left the pt a message to contact me in regards to scheduling her 1 year TAVR follow-up echo and OV.  She has a pending echo on 02/10/18 which can be rescheduled to the same day as OV with Nell Range PA-C. Pt underwent TAVR 02/05/2017.

## 2018-01-13 ENCOUNTER — Ambulatory Visit: Payer: Self-pay | Admitting: Surgery

## 2018-01-15 NOTE — Telephone Encounter (Signed)
I spoke with the pt and scheduled her for Echo and OV on 02/20/18 for 1 year s/p TAVR evaluation.

## 2018-02-10 ENCOUNTER — Other Ambulatory Visit (HOSPITAL_COMMUNITY): Payer: Self-pay

## 2018-02-20 ENCOUNTER — Other Ambulatory Visit (HOSPITAL_COMMUNITY): Payer: Self-pay

## 2018-02-20 ENCOUNTER — Ambulatory Visit: Payer: Self-pay | Admitting: Physician Assistant

## 2018-03-06 ENCOUNTER — Other Ambulatory Visit (HOSPITAL_COMMUNITY): Payer: Self-pay

## 2018-03-12 ENCOUNTER — Telehealth: Payer: Self-pay

## 2018-03-12 NOTE — Telephone Encounter (Signed)
From the perspective of her CAD and TAVR, she can stop the Plavix. I will let Dr. Trula Slade comment on the need for Plavix in regards to her carotid disease. Gerald Stabs

## 2018-03-12 NOTE — Telephone Encounter (Signed)
I contacted the Carrie Monroe to discuss rescheduling her 1 year TAVR follow-up.  The Carrie Monroe was scheduled 02/20/2018 but cancelled and she does not wish to reschedule Echocardiogram and follow-up OV.  The Carrie Monroe's husband passed away last month and she has a lot of expenses at this time, she does not want to add to what she already owes.  The Carrie Monroe also states that even if she did proceed with further testing if any abnormalities were found she does not plan on having additional procedures in the future. I did complete K[facility]Q over the phone.    The Carrie Monroe asked me about stopping her plavix. She states that she remembers being told that she would be able to stop this medication but she did not know at what timeframe.  The Carrie Monroe had TAVR performed 02/05/2017. During the Carrie Monroe's TAVR work up she was noted to have an 80-99% RICA stenosis (Dr Trula Slade following).  The Carrie Monroe was scheduled for follow-up with [facility] but cancelled these appointments too and does not plan to reschedule.  I will forward this message to Dr Angelena Form and Dr Trula Slade to determine plan for plavix.

## 2018-03-21 ENCOUNTER — Encounter: Payer: Self-pay | Admitting: Thoracic Surgery (Cardiothoracic Vascular Surgery)

## 2018-03-25 NOTE — Telephone Encounter (Signed)
Dr. Trula Slade agrees that the patient can stop plavix. I have left message with patient.

## 2018-04-16 ENCOUNTER — Telehealth: Payer: Self-pay | Admitting: Cardiovascular Disease

## 2018-04-16 NOTE — Telephone Encounter (Signed)
New message:      1. What dental office are you calling from? [facility]  2. What is your office phone number? [phone]  3. What is your fax number? [phone]  4. What type of procedure is the patient having performed? Removal of all upper teeth to fit for dentures  5. What date is procedure scheduled or is the patient there now? TBD  6. What is your question (ex. Antibiotics prior to procedure, holding medication-we need to know how long dentist wants pt to hold med)? Needs pt to stop taking plavix

## 2018-04-17 NOTE — Telephone Encounter (Signed)
This patient was instructed to stop Plavix !0/29/19.  I reviewed SBE recommendations with our pharmacist who said she does not need SBE prophylaxis since she is > 6 months post TAVR (Sept 2018).  Kerin Ransom PA-C 04/17/2018 3:29 PM

## 2018-10-15 IMAGING — CR DG CHEST 2V
2 series · 2 of 2 positions shown · non-contrast
Comparison: 01/14/2009, 01/10/2017

CLINICAL DATA: Aortic stenosis, preoperative evaluation

EXAM:
CHEST  2 VIEW

[w chest pa]
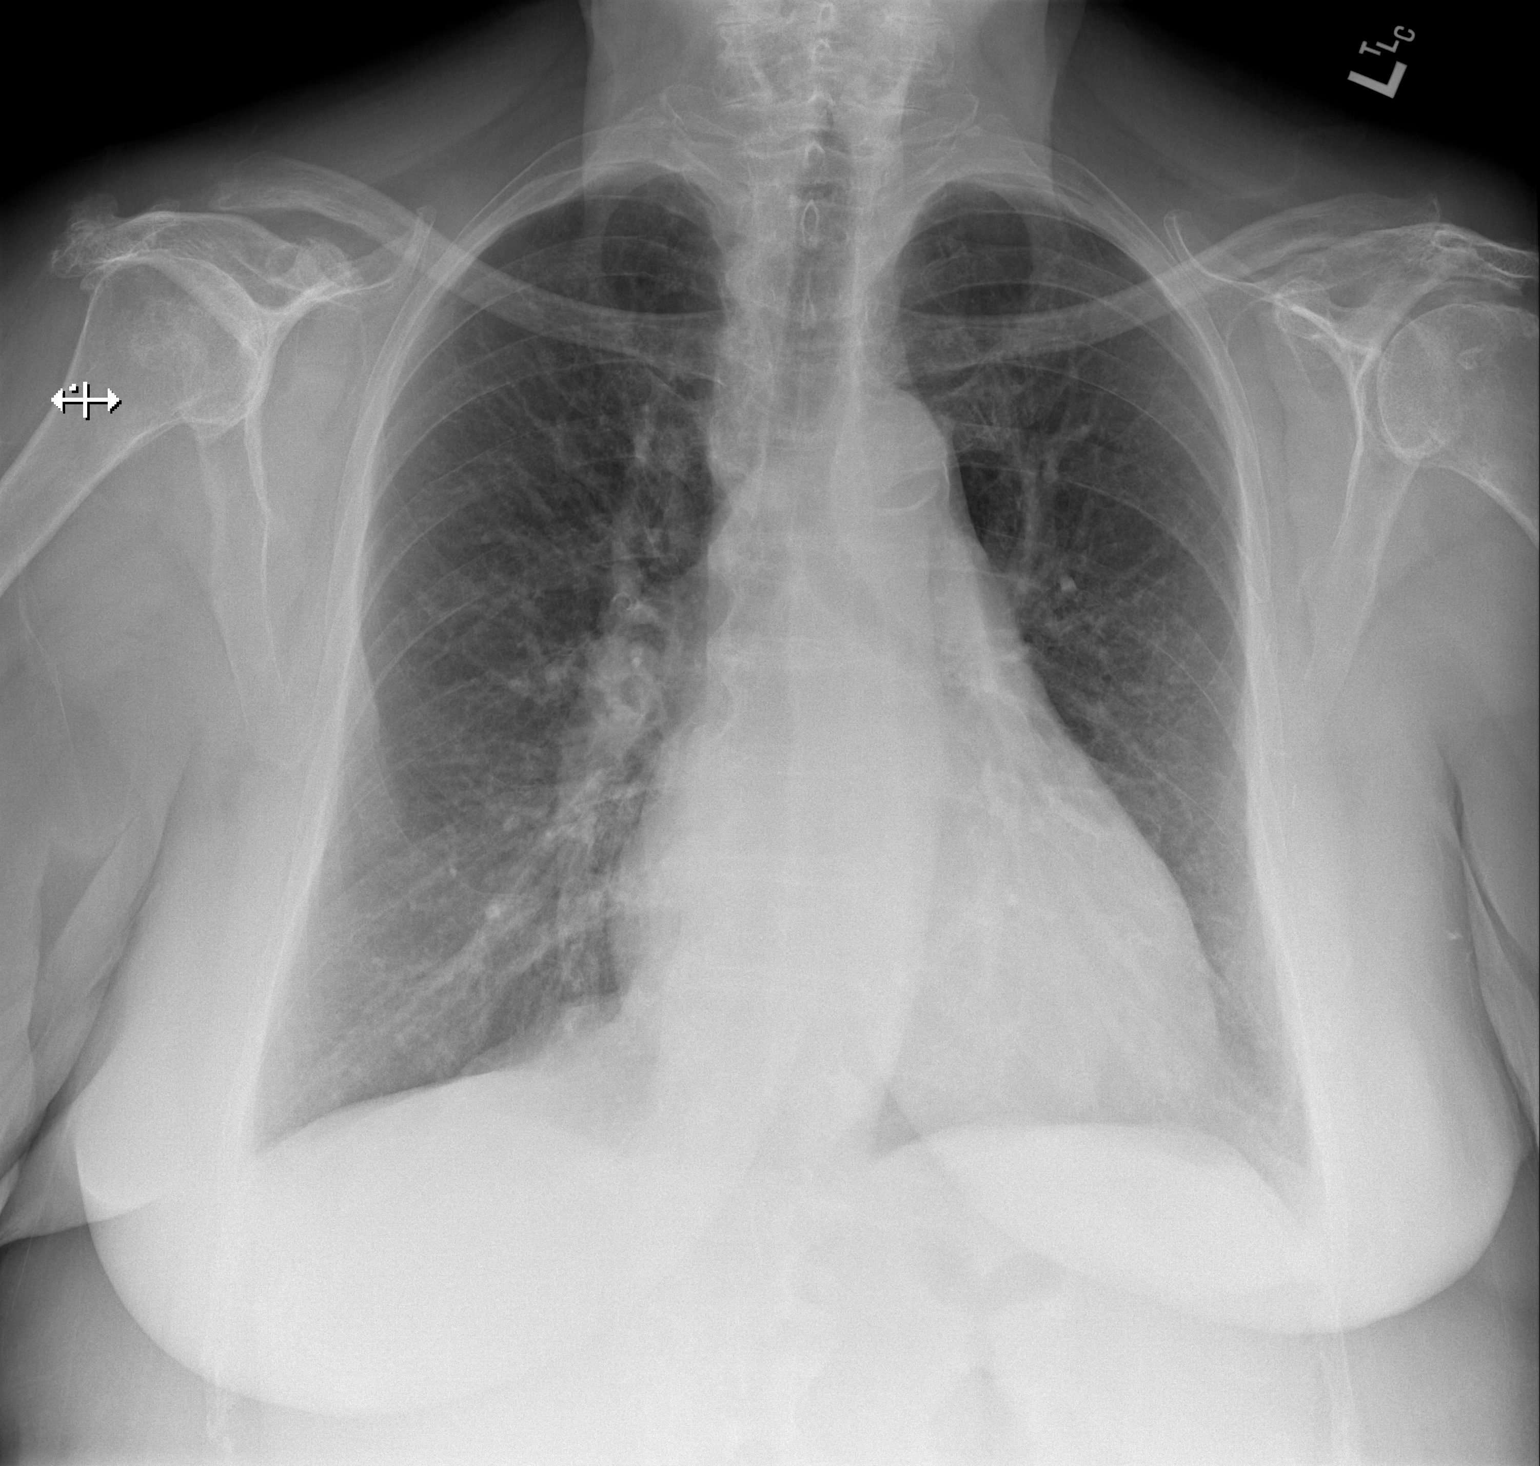

[w chest lat]
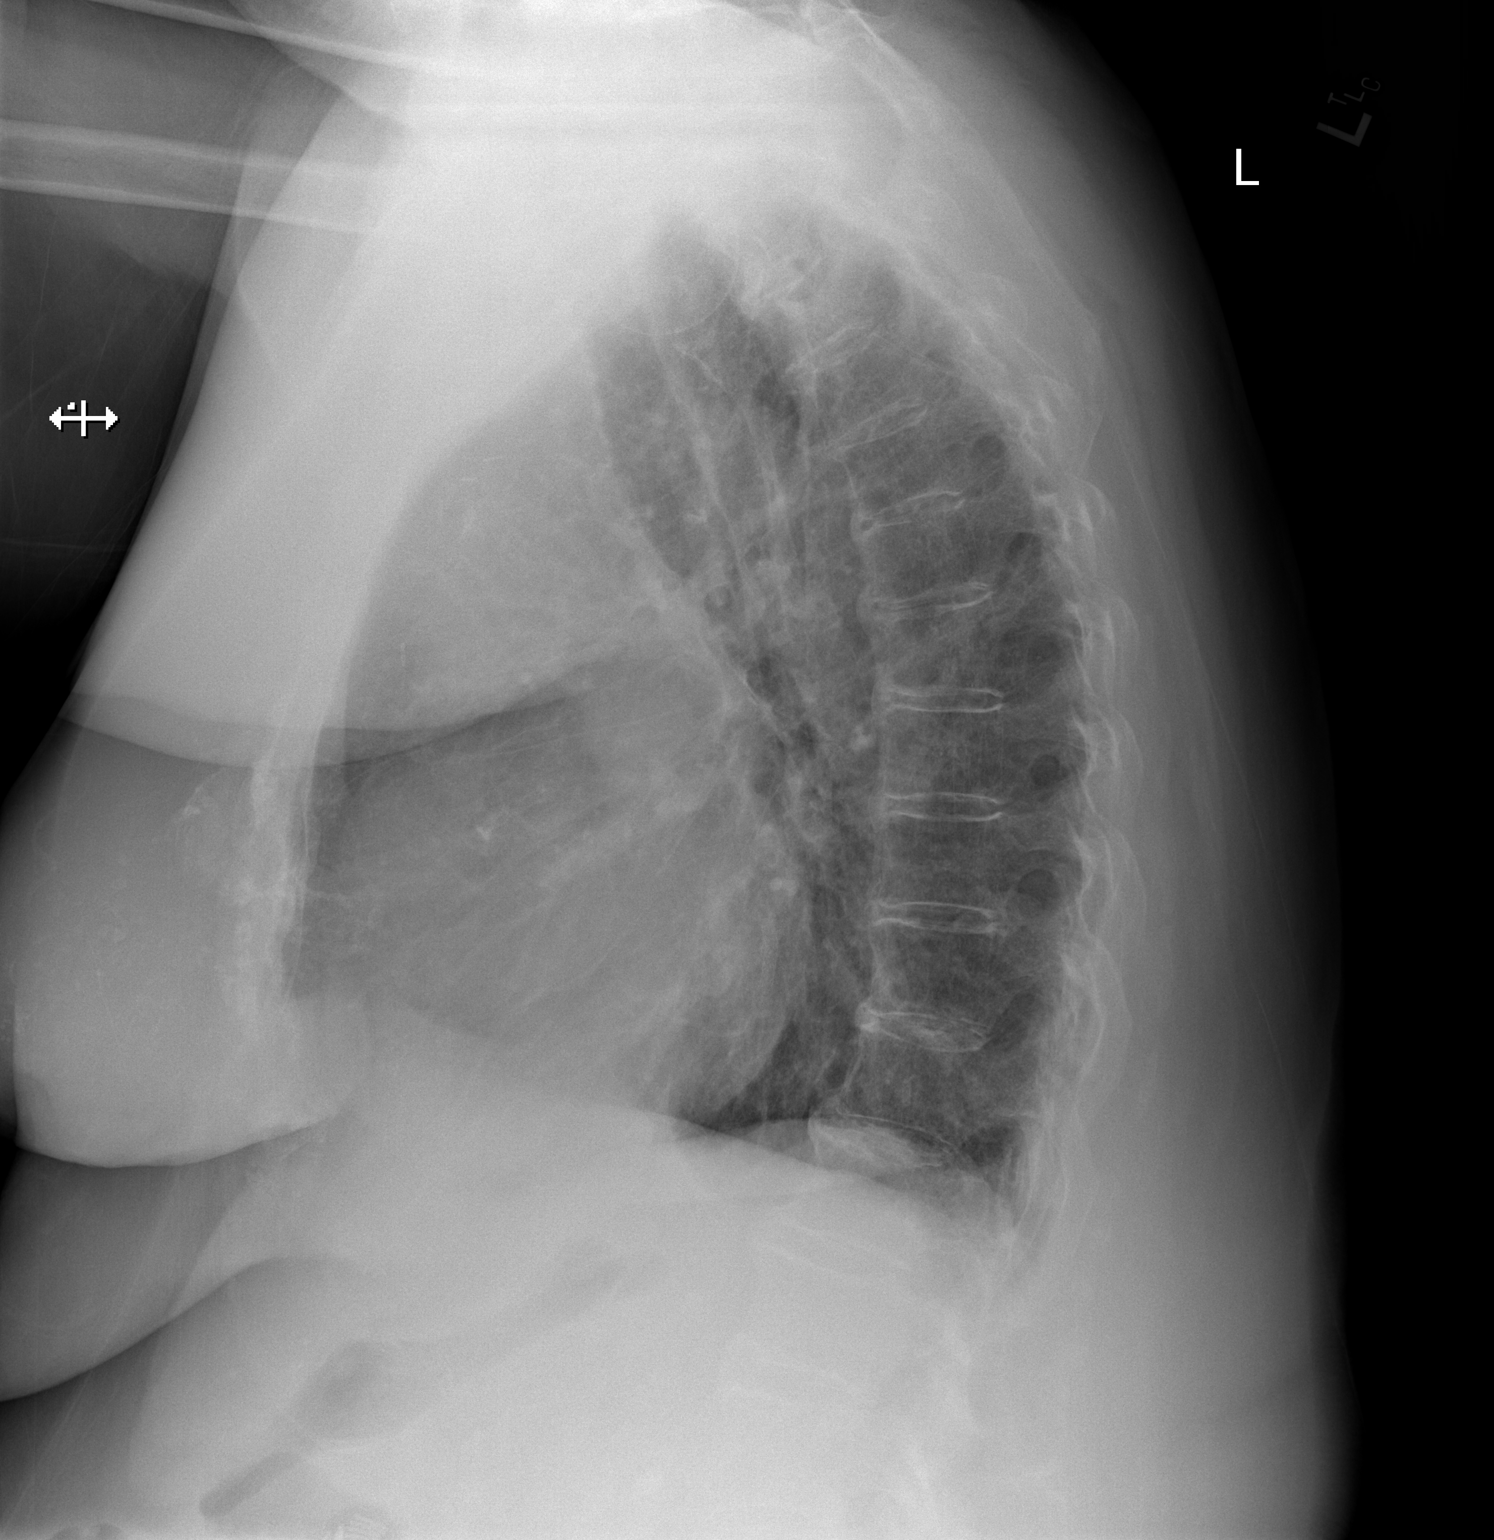

[2 of 2 positions shown; findings below may reference images not displayed]

FINDINGS: Stable cardiomegaly with mild vascular congestion. Aorta is
atherosclerotic. Mild hyperinflation without focal pneumonia,
collapse or consolidation. Negative for edema, effusion or
pneumothorax. Trachea is midline. Degenerative spondylosis of the
spine. Stable exam.
IMPRESSION: Stable chest exam.  No interval change or acute process

## 2018-10-19 IMAGING — DX DG ABD PORTABLE 1V
1 series · 1 of 1 positions shown · non-contrast
Comparison: 02/05/2017 at 1666 hours

CLINICAL DATA: Encounter for orogastric tube placement

EXAM:
PORTABLE ABDOMEN - 1 VIEW

[abdomen kub]
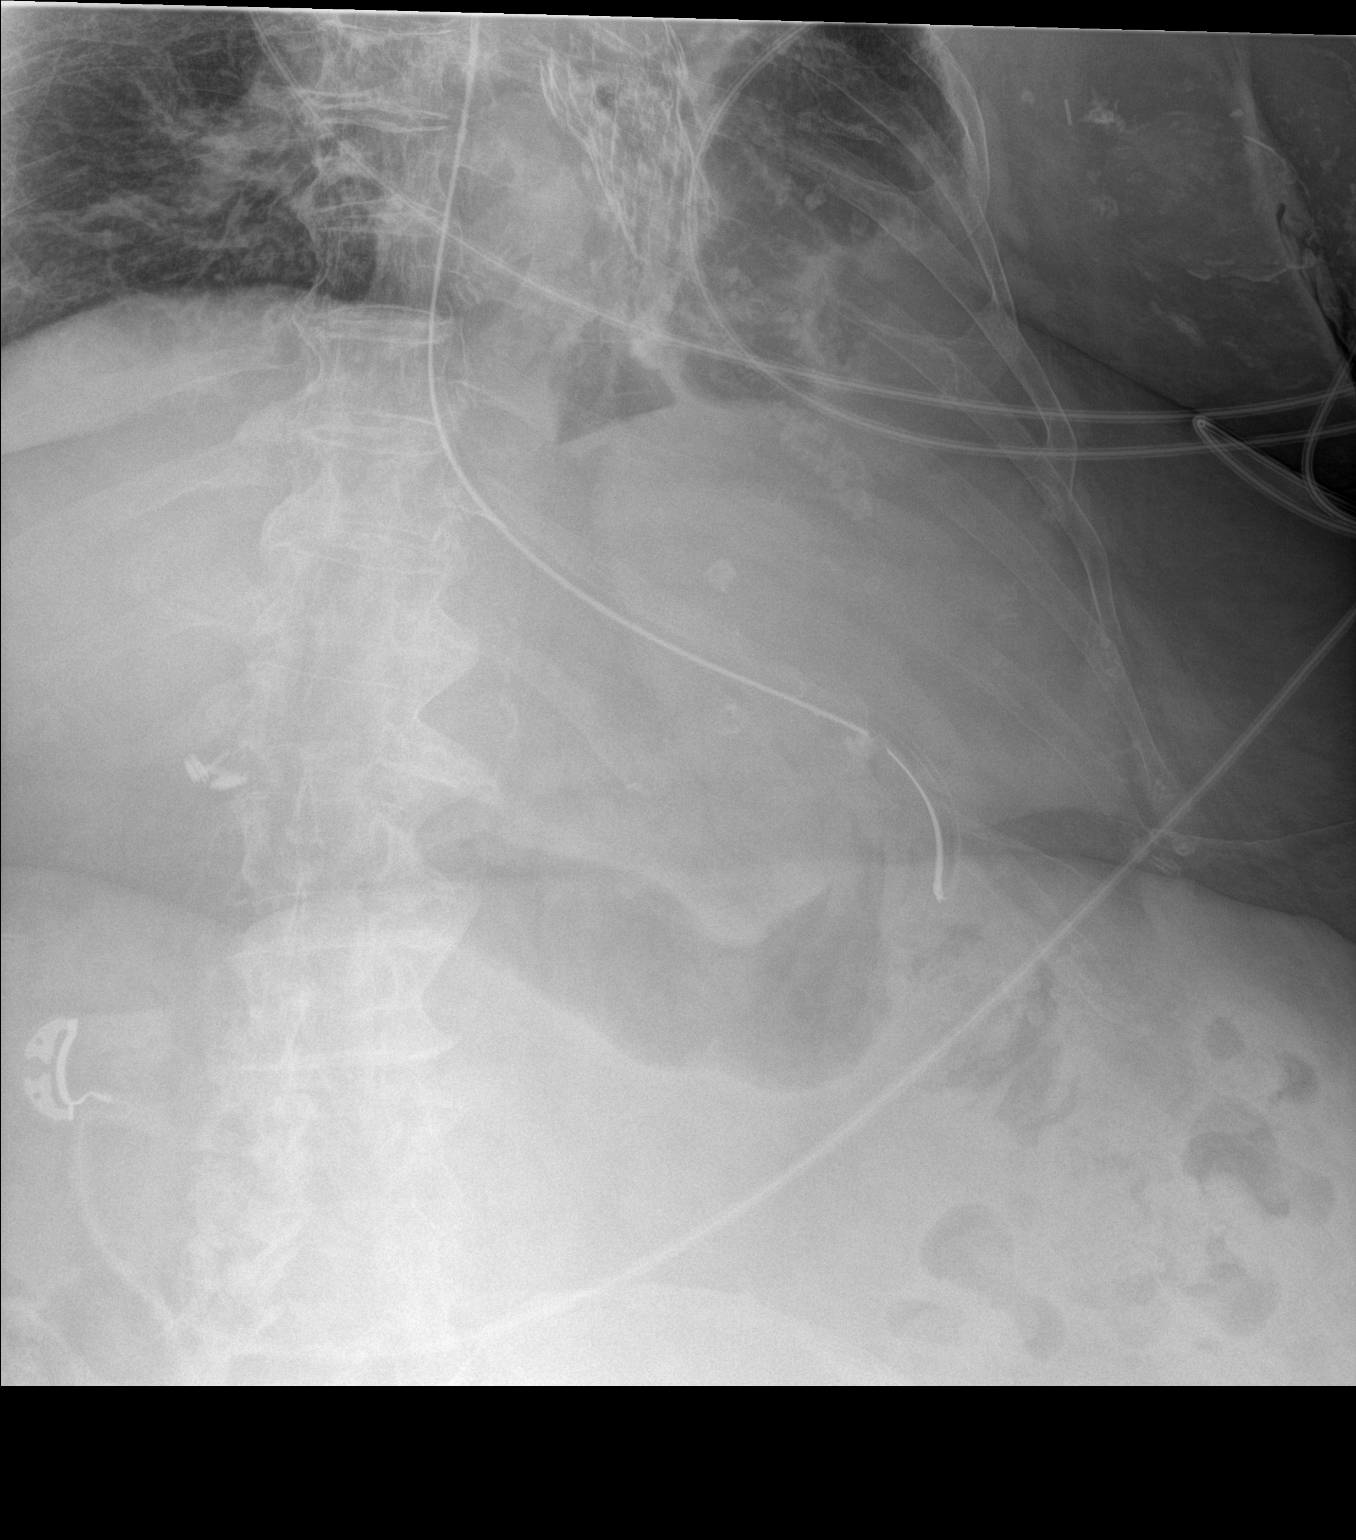

[1 of 1 positions shown; findings below may reference images not displayed]

FINDINGS: Targeted study of the left hemiabdomen from orogastric tube
position. The tip and side port of a gastric tube are identified in
the left upper quadrant of the abdomen in the expected location of
the proximal stomach. A vascular stent projects over the included
heart. Blunting the left costophrenic angle is seen. Surgical clips
project over the left breast shadow.
IMPRESSION: Gastric tube tip and side-port are in the expected location the
stomach.

## 2018-10-19 IMAGING — DX DG CHEST 1V PORT
1 series · 1 of 1 positions shown · non-contrast
Comparison: Chest x-ray dated February 01, 2017.

CLINICAL DATA: Status post TAVR.

EXAM:
PORTABLE CHEST 1 VIEW

[chest ap]
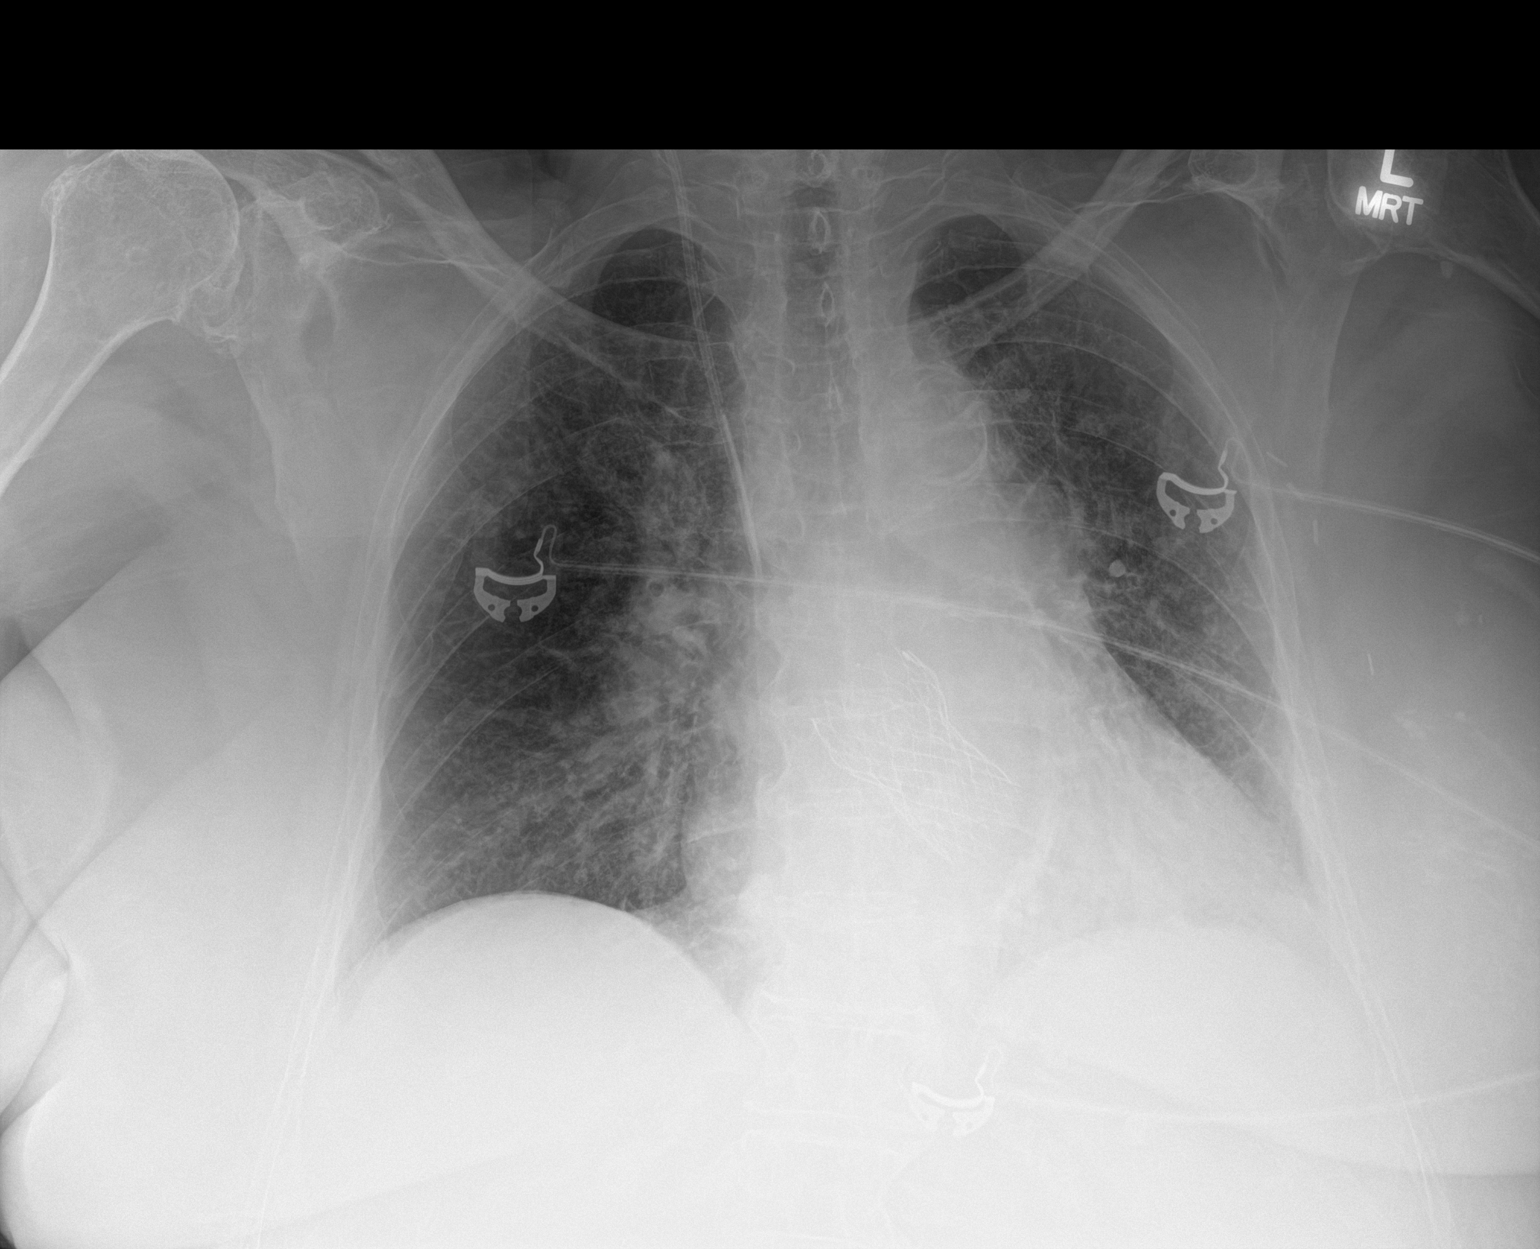

[1 of 1 positions shown; findings below may reference images not displayed]

FINDINGS: Interval TAVR. Right internal jugular central venous catheter with
the tip projecting over the distal SVC. Stable cardiomegaly. Mild
pulmonary vascular congestion. Atherosclerotic calcification of the
aortic arch. Bibasilar atelectasis. No pneumothorax or pleural
effusion. No acute osseous abnormality.
IMPRESSION: Interval TAVR. Mild pulmonary vascular congestion with bibasilar
atelectasis.

## 2018-10-20 IMAGING — DX DG CHEST 1V PORT
1 series · 1 of 1 positions shown · non-contrast
Comparison: Portable chest x-ray February 05, 2017

CLINICAL DATA: Intubated patient, status post aortic valve
replacement yesterday.

EXAM:
PORTABLE CHEST 1 VIEW

[chest ap]
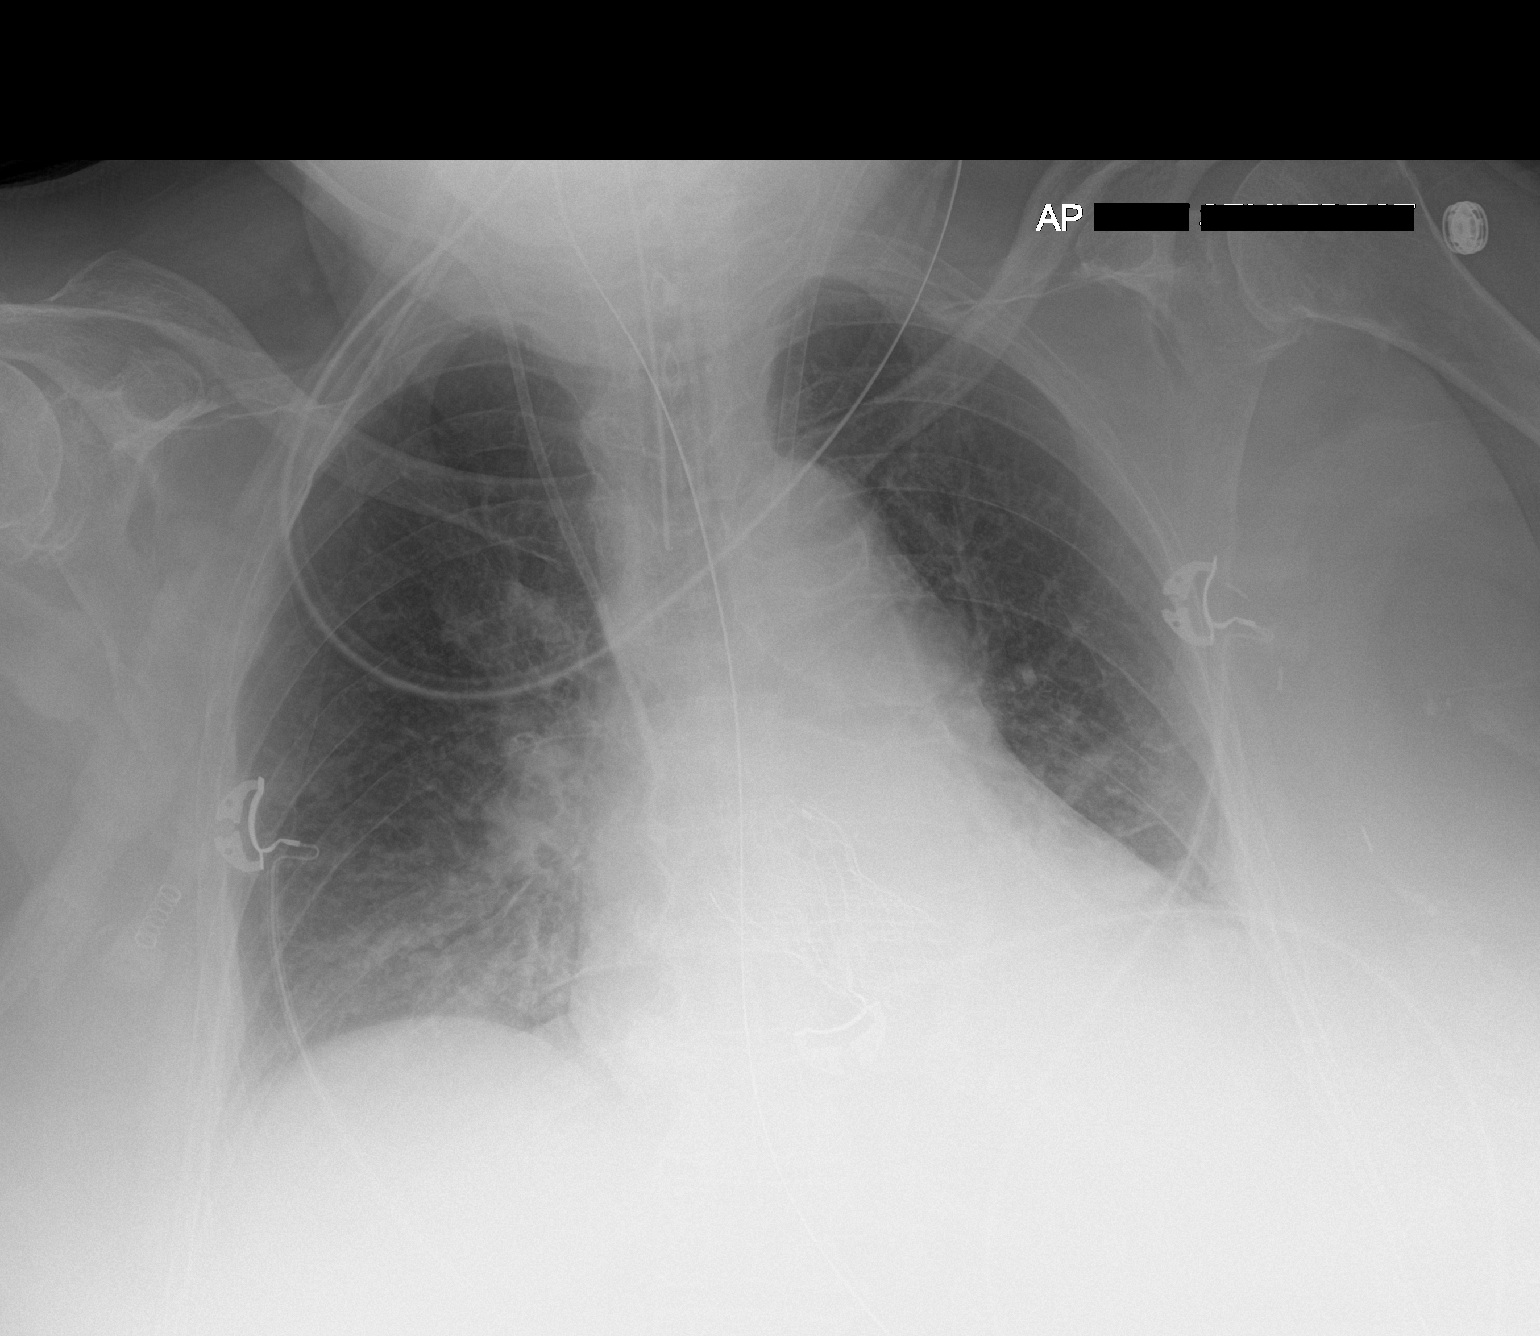

[1 of 1 positions shown; findings below may reference images not displayed]

FINDINGS: The lungs are well-expanded. The interstitial markings are less
conspicuous. The central pulmonary vascularity remains prominent.
There is a trace of pleural fluid on the left. The cardiac
silhouette is enlarged. The prosthetic aortic valve and aortic stent
appear stable. The endotracheal tube tip projects 3.2 cm above the
carina. The esophagogastric tube tip projects below the inferior
margin of the image. The right internal jugular venous catheter tip
projects over the midportion of the SVC.
IMPRESSION: Decreased pulmonary interstitial edema and pulmonary vascular
congestion. Tiny left pleural effusion. The support tubes are in
stable position.

Thoracic aortic atherosclerosis.

## 2018-12-11 ENCOUNTER — Other Ambulatory Visit: Payer: Self-pay

## 2018-12-11 MED ORDER — METOPROLOL TARTRATE 25 MG PO TABS: 12.5000 mg | ORAL_TABLET | Freq: Two times a day (BID) | ORAL | 0 refills | Status: DC

## 2019-01-22 ENCOUNTER — Other Ambulatory Visit: Payer: Self-pay | Admitting: Internal Medicine

## 2019-01-22 DIAGNOSIS — I6529 Occlusion and stenosis of unspecified carotid artery: Secondary | ICD-10-CM

## 2019-01-22 DIAGNOSIS — R42 Dizziness and giddiness: Secondary | ICD-10-CM

## 2019-01-23 ENCOUNTER — Encounter: Payer: Self-pay | Admitting: Cardiovascular Disease

## 2019-01-23 ENCOUNTER — Telehealth: Payer: Self-pay | Admitting: *Deleted

## 2019-01-23 ENCOUNTER — Telehealth (INDEPENDENT_AMBULATORY_CARE_PROVIDER_SITE_OTHER): Payer: Medicare Other | Admitting: Cardiovascular Disease

## 2019-01-23 VITALS — BP 169/74 | HR 59 | Ht 63.0 in | Wt 215.0 lb

## 2019-01-23 DIAGNOSIS — I6521 Occlusion and stenosis of right carotid artery: Secondary | ICD-10-CM

## 2019-01-23 DIAGNOSIS — R42 Dizziness and giddiness: Secondary | ICD-10-CM

## 2019-01-23 DIAGNOSIS — E785 Hyperlipidemia, unspecified: Secondary | ICD-10-CM

## 2019-01-23 DIAGNOSIS — I2510 Atherosclerotic heart disease of native coronary artery without angina pectoris: Secondary | ICD-10-CM | POA: Diagnosis not present

## 2019-01-23 DIAGNOSIS — I5032 Chronic diastolic (congestive) heart failure: Secondary | ICD-10-CM | POA: Diagnosis not present

## 2019-01-23 DIAGNOSIS — E669 Obesity, unspecified: Secondary | ICD-10-CM

## 2019-01-23 DIAGNOSIS — N183 Chronic kidney disease, stage 3 unspecified: Secondary | ICD-10-CM

## 2019-01-23 DIAGNOSIS — E1169 Type 2 diabetes mellitus with other specified complication: Secondary | ICD-10-CM

## 2019-01-23 DIAGNOSIS — Z952 Presence of prosthetic heart valve: Secondary | ICD-10-CM

## 2019-01-23 DIAGNOSIS — I10 Essential (primary) hypertension: Secondary | ICD-10-CM

## 2019-01-23 NOTE — Patient Instructions (Signed)
Medication Instructions:  Your physician recommends that you continue on your current medications as directed. Please refer to the Current Medication list given to you today.  If you need a refill on your cardiac medications before your next appointment, please call your pharmacy.   Lab work: None ordered If you have labs (blood work) drawn today and your tests are completely normal, you will receive your results only by: [place] (if you have MyChart) OR A paper copy in the mail If you have any lab test that is abnormal or we need to change your treatment, we will call you to review the results.  Testing/Procedures: Your physician has requested that you have an echocardiogram. Echocardiography is a painless test that uses sound waves to create images of your heart. It provides your doctor with information about the size and shape of your heart and how well your heart's chambers and valves are working. You may receive an ultrasound enhancing agent through an IV if needed to better visualize your heart during the echo.This procedure takes approximately one hour. There are no restrictions for this procedure. This will take place at the [address].    Follow-Up: A virtual appointment has been made for 01/29/2019 at 3:20 pm.
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
## 2019-01-23 NOTE — Telephone Encounter (Signed)

## 2019-01-26 ENCOUNTER — Ambulatory Visit
Admission: RE | Admit: 2019-01-26 | Discharge: 2019-01-26 | Disposition: A | Discharge status: Home / Self Care | Payer: Medicare Other | Source: Ambulatory Visit | Attending: Internal Medicine | Admitting: Internal Medicine

## 2019-01-26 ENCOUNTER — Telehealth: Payer: Self-pay | Admitting: Cardiovascular Disease

## 2019-01-26 DIAGNOSIS — I6529 Occlusion and stenosis of unspecified carotid artery: Secondary | ICD-10-CM

## 2019-01-26 DIAGNOSIS — R42 Dizziness and giddiness: Secondary | ICD-10-CM

## 2019-01-26 NOTE — Telephone Encounter (Signed)
° ° °  Office staff calling to request EKG that was scanned into Epic on 8/28 be faxed back to them. They did not retain a copy for their records   Please fax [phone] or [phone]

## 2019-01-28 ENCOUNTER — Ambulatory Visit (HOSPITAL_COMMUNITY): Payer: Medicare Other | Attending: Cardiovascular Disease

## 2019-01-28 ENCOUNTER — Other Ambulatory Visit: Payer: Self-pay

## 2019-01-28 DIAGNOSIS — Z952 Presence of prosthetic heart valve: Secondary | ICD-10-CM | POA: Insufficient documentation

## 2019-01-28 DIAGNOSIS — R42 Dizziness and giddiness: Secondary | ICD-10-CM | POA: Insufficient documentation

## 2019-01-29 ENCOUNTER — Telehealth (INDEPENDENT_AMBULATORY_CARE_PROVIDER_SITE_OTHER): Payer: Medicare Other | Admitting: Cardiovascular Disease

## 2019-01-29 ENCOUNTER — Encounter: Payer: Self-pay | Admitting: Cardiovascular Disease

## 2019-01-29 DIAGNOSIS — I6521 Occlusion and stenosis of right carotid artery: Secondary | ICD-10-CM | POA: Diagnosis not present

## 2019-01-29 DIAGNOSIS — E1169 Type 2 diabetes mellitus with other specified complication: Secondary | ICD-10-CM

## 2019-01-29 DIAGNOSIS — Z952 Presence of prosthetic heart valve: Secondary | ICD-10-CM

## 2019-01-29 DIAGNOSIS — E785 Hyperlipidemia, unspecified: Secondary | ICD-10-CM

## 2019-01-29 DIAGNOSIS — I2510 Atherosclerotic heart disease of native coronary artery without angina pectoris: Secondary | ICD-10-CM | POA: Diagnosis not present

## 2019-01-29 DIAGNOSIS — N183 Chronic kidney disease, stage 3 unspecified: Secondary | ICD-10-CM

## 2019-01-29 DIAGNOSIS — E669 Obesity, unspecified: Secondary | ICD-10-CM

## 2019-01-29 DIAGNOSIS — I5032 Chronic diastolic (congestive) heart failure: Secondary | ICD-10-CM | POA: Diagnosis not present

## 2019-01-29 DIAGNOSIS — I10 Essential (primary) hypertension: Secondary | ICD-10-CM

## 2019-01-29 NOTE — Patient Instructions (Signed)
Medication Instructions:  STOP the Metoprolol  If you need a refill on your cardiac medications before your next appointment, please call your pharmacy.   Lab work: None ordered If you have labs (blood work) drawn today and your tests are completely normal, you will receive your results only by: [place] MyChart Message (if you have MyChart) OR . A paper copy in the mail If you have any lab test that is abnormal or we need to change your treatment, we will call you to review the results.  Testing/Procedures: None ordered  Follow-Up: At [facility], you and your health needs are our priority.  As part of our continuing mission to provide you with exceptional heart care, we have created designated Provider Care Teams.  These Care Teams include your primary Cardiologist (physician) and Advanced Practice Providers (APPs -  Physician Assistants and Nurse Practitioners) who all work together to provide you with the care you need, when you need it. You will need a follow up appointment in 3 months.  Please call our office 2 months in advance to schedule this appointment.  You may see Sanda Klein, MD or one of the following Advanced Practice Providers on your designated Care Team: [place], [place] . Fabian Sharp, PA-C
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
## 2019-02-05 ENCOUNTER — Other Ambulatory Visit: Payer: Self-pay | Admitting: Internal Medicine

## 2019-02-05 DIAGNOSIS — R42 Dizziness and giddiness: Secondary | ICD-10-CM

## 2019-02-05 DIAGNOSIS — I6529 Occlusion and stenosis of unspecified carotid artery: Secondary | ICD-10-CM

## 2019-02-09 ENCOUNTER — Other Ambulatory Visit: Payer: Self-pay | Admitting: Internal Medicine

## 2019-02-09 ENCOUNTER — Other Ambulatory Visit: Payer: Self-pay

## 2019-02-09 ENCOUNTER — Encounter: Payer: Self-pay | Admitting: Surgery

## 2019-02-09 ENCOUNTER — Ambulatory Visit (HOSPITAL_COMMUNITY)
Admission: RE | Admit: 2019-02-09 | Discharge: 2019-02-09 | Disposition: A | Discharge status: Home / Self Care | Payer: Medicare Other | Source: Ambulatory Visit | Attending: Family | Admitting: Family

## 2019-02-09 ENCOUNTER — Ambulatory Visit: Payer: Medicare Other | Admitting: Surgery

## 2019-02-09 VITALS — BP 161/65 | HR 62 | Temp 97.1°F | Resp 20 | Ht 63.0 in | Wt 215.0 lb

## 2019-02-09 DIAGNOSIS — I6521 Occlusion and stenosis of right carotid artery: Secondary | ICD-10-CM

## 2019-02-09 DIAGNOSIS — I6529 Occlusion and stenosis of unspecified carotid artery: Secondary | ICD-10-CM

## 2019-02-09 DIAGNOSIS — I6523 Occlusion and stenosis of bilateral carotid arteries: Secondary | ICD-10-CM

## 2019-02-09 DIAGNOSIS — R42 Dizziness and giddiness: Secondary | ICD-10-CM

## 2019-02-09 NOTE — Progress Notes (Signed)
[facility] [place]  Patient name: Carrie Monroe MRN: [id number] DOB: 29-Oct-1935 Sex: female   REASON FOR VISIT:    Follow up carotid stenosis  HISOTRY OF PRESENT ILLNESS:    Carrie Monroe is a 83 y.o. female who returns today for follow-up.  She is well-known to me having undergone urgent repair of a groin complication after TAVR.  She has had significant issues with wound healing.  Fortunately she has been able to heal her wounds.  She has a known greater than 80% right carotid stenosis but has been refusing intervention.  Recently, she began having symptoms of dizziness.  She is now back to discuss carotid revascularization.   PAST MEDICAL HISTORY:   Past Medical History:  Diagnosis Date  . Aortic stenosis, severe    a. 01/2017: s/p TAVR; hospital course complicated by large groin hematoma/wound  . Arthritis    "fingers" (11/06/2017)  . Cancer of left breast ([place]) 2009   s/p lumpectomy and XRT  . Carotid artery stenosis    a. 11/2016: 80-99% RICA stenosis, 1-39% vs low range 56-38% LICA   . Chronic diastolic CHF (congestive heart failure) ([place])   . CKD (chronic kidney disease)   . Coronary artery disease    a. 11/2016: diagnosed with multivessel CAD, turned down for CABG and underwent PCI/DES to mLCx, PCI/DES to mLAD and PCTA of ostial diagonal on 12/28/16  . Diabetes mellitus type 2, diet-controlled ([place])   . Exogenous obesity   . Gout    "on daily RX" (11/06/2017)  . Heart murmur   . Hemorrhoids   . History of blood transfusion 01/2017   "28 pints"  . History of kidney stones   . Hyperlipidemia   . Hypertension   . 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 Squamous carcinoma 10/2017   "scalp"     FAMILY HISTORY:   Family History  Problem Relation Age of Onset   . Cancer Mother        pancreatic  . Heart disease Father   . Hypertension Father   . Cancer Brother   . Diabetes Sister     SOCIAL HISTORY:   Social History   Tobacco Use  . Smoking status: Never Smoker  . Smokeless tobacco: Never Used  Substance Use Topics  . Alcohol use: Never    Frequency: Never     ALLERGIES:   Allergies  Allergen Reactions  . Oxycodone Other (See Comments)    Hallunication     CURRENT MEDICATIONS:   Current Outpatient Medications  Medication Sig Dispense Refill  . aspirin 81 MG chewable tablet Chew 1 tablet (81 mg total) by mouth daily.    . Black Elderberry 50 MG/5ML SYRP Take by mouth.    . Cholecalciferol (VITAMIN D PO) Take 1 capsule by mouth daily.    . COLLAGEN PO Take by mouth daily. power     . Cyanocobalamin (VITAMIN B 12 PO) Take by mouth.    . furosemide (LASIX) 40 MG tablet Take 1 tablet (40 mg total) by mouth daily as needed for fluid or edema. 90 tablet 1  . hydrALAZINE (APRESOLINE) 50 MG tablet Take 50 mg by mouth 2 (two) times daily.     Marland Kitchen olmesartan (BENICAR) 20 MG tablet Take 20 mg by mouth daily.    . rosuvastatin (CRESTOR) 10 MG tablet Take 10  mg by mouth daily.   0  . Zinc 50 MG TABS Take by mouth.     No current facility-administered medications for this visit.     REVIEW OF SYSTEMS:   [X]  denotes positive finding, [ ]  denotes negative finding Cardiac  Comments:  Chest pain or chest pressure:    Shortness of breath upon exertion:    Short of breath when lying flat:    Irregular heart rhythm:        Vascular    Pain in calf, thigh, or hip brought on by ambulation:    Pain in feet at night that wakes you up from your sleep:     Blood clot in your veins:    Leg swelling:         Pulmonary    Oxygen at home:    Productive cough:     Wheezing:         Neurologic    Sudden weakness in arms or legs:     Sudden numbness in arms or legs:     Sudden onset of difficulty speaking or slurred speech:    Temporary  loss of vision in one eye:     Problems with dizziness:  x       Gastrointestinal    Blood in stool:     Vomited blood:         Genitourinary    Burning when urinating:     Blood in urine:        Psychiatric    Major depression:         Hematologic    Bleeding problems:    Problems with blood clotting too easily:        Skin    Rashes or ulcers:        Constitutional    Fever or chills:      PHYSICAL EXAM:   Vitals:   02/09/19 1051  BP: (!) 161/65  Pulse: 62  Resp: 20  Temp: (!) 97.1 F (36.2 C)  SpO2: 98%  Weight: 215 lb (97.5 kg)  Height: 5\' 3"  (1.6 m)    GENERAL: The patient is a well-nourished female, in no acute distress. The vital signs are documented above. CARDIAC: There is a regular rate and rhythm.  PULMONARY: Non-labored respirations  MUSCULOSKELETAL: There are no major deformities or cyanosis. NEUROLOGIC: No focal weakness or paresthesias are detected. SKIN: There are no ulcers or rashes noted. PSYCHIATRIC: The patient has a normal affect.  STUDIES:   I have ordered and reviewed her ultrasound with the following results: Right Carotid: Velocities in the right ICA are consistent with a 80-99%                stenosis.  Left Carotid: Velocities in the left ICA are consistent with a 40-59% stenosis.  Vertebrals:  Bilateral vertebral arteries demonstrate antegrade flow. Subclavians: Normal flow hemodynamics were seen in bilateral subclavian              arteries.  MEDICAL ISSUES:   Right carotid stenosis: The patient is now having dizziness.  She also reports some left arm numbness but this is also present in the right.  There is the concern that this is a symptomatic stenosis.  In the past, she has refused carotid endarterectomy but now with her current symptoms is more open to the idea.  I discussed the possibility of TCAR.  She is scheduled to get a CT scan this week.  Unfortunately this will  be without contrast because of her renal function.   I did look at her carotid artery distance from the clavicle to the bifurcation with ultrasound and she says just above 5 cm.  I suspect that if there are no contraindications based on her CT scan that we could consider TCAR.  I will have her scheduled for a phone visit next week to discuss the results and to schedule her procedure.  I did discuss the details of both stenting and endarterectomy today with her and her daughter-in-law.  All of her questions were answered.  If we go the stenting route, she will need to be started on Plavix.    Leia Alf, MD, FACS [facility] [phone] Pager [phone]

## 2019-02-11 ENCOUNTER — Other Ambulatory Visit: Payer: Medicare Other
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
## 2019-02-16 ENCOUNTER — Encounter: Payer: Self-pay | Admitting: Adult Health

## 2019-02-16 ENCOUNTER — Other Ambulatory Visit: Payer: Medicare Other

## 2019-02-16 ENCOUNTER — Telehealth (INDEPENDENT_AMBULATORY_CARE_PROVIDER_SITE_OTHER): Payer: Medicare Other | Admitting: Adult Health

## 2019-02-16 ENCOUNTER — Ambulatory Visit: Payer: Medicare Other | Admitting: Surgery

## 2019-02-16 VITALS — BP 167/73 | HR 68 | Ht 63.0 in | Wt 215.0 lb

## 2019-02-16 DIAGNOSIS — I10 Essential (primary) hypertension: Secondary | ICD-10-CM

## 2019-02-16 DIAGNOSIS — Z952 Presence of prosthetic heart valve: Secondary | ICD-10-CM

## 2019-02-16 DIAGNOSIS — I6523 Occlusion and stenosis of bilateral carotid arteries: Secondary | ICD-10-CM

## 2019-02-16 DIAGNOSIS — E7800 Pure hypercholesterolemia, unspecified: Secondary | ICD-10-CM

## 2019-02-17 ENCOUNTER — Ambulatory Visit
Admission: RE | Admit: 2019-02-17 | Discharge: 2019-02-17 | Disposition: A | Discharge status: Home / Self Care | Payer: Medicare Other | Source: Ambulatory Visit | Attending: Internal Medicine | Admitting: Internal Medicine

## 2019-02-17 DIAGNOSIS — R42 Dizziness and giddiness: Secondary | ICD-10-CM

## 2019-02-17 DIAGNOSIS — I6529 Occlusion and stenosis of unspecified carotid artery: Secondary | ICD-10-CM

## 2019-02-17 MED ORDER — IOPAMIDOL (ISOVUE-370) INJECTION 76%
60.0000 mL | Freq: Once | INTRAVENOUS | Status: AC | PRN
  Administered 2019-02-17: 60 mL via INTRAVENOUS

## 2019-02-23 ENCOUNTER — Ambulatory Visit (INDEPENDENT_AMBULATORY_CARE_PROVIDER_SITE_OTHER): Payer: Medicare Other | Admitting: Surgery

## 2019-02-23 ENCOUNTER — Other Ambulatory Visit: Payer: Self-pay | Admitting: *Deleted

## 2019-02-23 ENCOUNTER — Encounter: Payer: Self-pay | Admitting: Surgery

## 2019-02-23 ENCOUNTER — Other Ambulatory Visit: Payer: Self-pay

## 2019-02-23 DIAGNOSIS — I6521 Occlusion and stenosis of right carotid artery: Secondary | ICD-10-CM

## 2019-02-23 MED ORDER — CLOPIDOGREL BISULFATE 75 MG PO TABS: 75.0000 mg | ORAL_TABLET | Freq: Every day | ORAL | 11 refills | Status: DC

## 2019-02-23 NOTE — Progress Notes (Signed)
[facility] [place]  Patient name: Carrie Monroe MRN: [id number] DOB: [birth year] Sex: female      Virtual Visit via Telephone Note   This visit type was conducted due to national recommendations for restrictions regarding the COVID-19 Pandemic (e.g. social distancing) in an effort to limit this patient's exposure and mitigate transmission in our community.  Due to her co-morbid illnesses, this patient is at least at moderate risk for complications without adequate follow up.  This format is felt to be most appropriate for this patient at this time.  The patient did not have access to video technology/had technical difficulties with video requiring transitioning to audio format only (telephone).  All issues noted in this document were discussed and addressed.  No physical exam could be performed with this format.   Patient Location: Home Provider Location: Office    REASON FOR APPOINTMENT:    Follow up   HISTORY OF PRESENT ILLNESS:   Carrie Monroe is a 83 y.o. female, who returns today for follow-up.  She is well-known to me having undergone emergent repair after bleeding complication following TAVR.  She has had a known right carotid stenosis but has been refusing intervention.  She recently began having symptoms of dizziness and is concerned that this may be secondary to her carotid disease.  I sent her for a CT scan to determine whether or not she would be a candidate for TCAR  She says she is having a bad day today.  She is very dizzy.  She is having a family member present for conversation because her memory has deteriorated.  She continues to take a statin for hypercholesterolemia.  She is on single agent antiplatelet therapy with an aspirin.  She is medically managed for hypertension.   The patient does not have symptoms concerning for COVID-19 infection (fever, chills, cough, or new shortness of breath).   PAST MEDICAL HISTORY     Past Medical History:  Diagnosis Date  . Aortic stenosis, severe    a. 01/2017: s/p TAVR; hospital course complicated by large groin hematoma/wound  . Arthritis    "fingers" (11/06/2017)  . Cancer of left breast ([place]) 2009   s/p lumpectomy and XRT  . Carotid artery stenosis    a. 11/2016: 80-99% RICA stenosis, 1-39% vs low range 56-25% LICA   . Chronic diastolic CHF (congestive heart failure) ([place])   . CKD (chronic kidney disease)   . Coronary artery disease    a. 11/2016: diagnosed with multivessel CAD, turned down for CABG and underwent PCI/DES to mLCx, PCI/DES to mLAD and PCTA of ostial diagonal on 12/28/16  . Diabetes mellitus type 2, diet-controlled ([place])   . Exogenous obesity   . Gout    "on daily RX" (11/06/2017)  . Heart murmur   . Hemorrhoids   . History of blood transfusion 01/2017   "28 pints"  . History of kidney stones   . Hyperlipidemia   . Hypertension   . 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 Squamous carcinoma 10/2017   "scalp"     FAMILY HISTORY   Family History  Problem Relation Age of Onset  . Cancer Mother        pancreatic  . Heart disease Father   . Hypertension Father   . Cancer Brother   .  Diabetes Sister     SOCIAL HISTORY:   Social History   Socioeconomic History  . Marital status: Married    Spouse name: Not on file  . Number of children: 4  . Years of education: Not on file  . Highest education level: Not on file  Occupational History  . 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 Financial resource strain: Not on file  . Food insecurity    Worry: Not on file    Inability: Not on file  . Transportation needs    Medical: Not on file    Non-medical: Not on file  Tobacco Use  . Smoking status: Never Smoker  . Smokeless tobacco: Never Used  Substance and Sexual Activity  .  Alcohol use: Never    Frequency: Never  . Drug use: Never  . Sexual activity: Not Currently  Lifestyle  . Physical activity    Days per week: Not on file    Minutes per session: Not on file  . Stress: Not on file  Relationships  . Social Herbalist on phone: Not on file    Gets together: Not on file    Attends religious service: Not on file    Active member of club or organization: Not on file    Attends meetings of clubs or organizations: Not on file    Relationship status: Not on file  . Intimate partner violence    Fear of current or ex partner: Not on file    Emotionally abused: Not on file    Physically abused: Not on file    Forced sexual activity: Not on file  Other Topics Concern  . Not on file  Social History Narrative   Epworth Sleepiness scale score =11 as of 01/25/16    ALLERGIES:    Allergies  Allergen Reactions  . Oxycodone Other (See Comments)    Hallunication    CURRENT MEDICATIONS:    Current Outpatient Medications  Medication Sig Dispense Refill  . aspirin 81 MG chewable tablet Chew 1 tablet (81 mg total) by mouth daily.    . Black Elderberry 50 MG/5ML SYRP Take by mouth.    . Cholecalciferol (VITAMIN D PO) Take 1 capsule by mouth daily.    . COLLAGEN PO Take by mouth daily. power     . Cyanocobalamin (VITAMIN B 12 PO) Take by mouth.    . furosemide (LASIX) 40 MG tablet Take 1 tablet (40 mg total) by mouth daily as needed for fluid or edema. 90 tablet 1  . hydrALAZINE (APRESOLINE) 50 MG tablet Take 75 mg by mouth every 8 (eight) hours.     . rosuvastatin (CRESTOR) 10 MG tablet Take 10 mg by mouth daily.   0  . Zinc 50 MG TABS Take by mouth.    . olmesartan (BENICAR) 20 MG tablet Take 20 mg by mouth daily.     No current facility-administered medications for this visit.     REVIEW OF SYSTEMS:   Please see the history of present illness.     All other systems reviewed and are negative.  PHYSICAL EXAM:   There were no vitals  filed for this visit.  GENERAL: The patient is no acute distress PULMONARY: Nonlabored breathing NEUROLOGIC: No slurred speech PSYCHIATRIC: The patient has a normal affect.   Recent Labs: No results found for requested labs within last 8760 hours.   Recent Lipid Panel No results found for: CHOL, TRIG, HDL, CHOLHDL, LDLCALC,  LDLDIRECT  Wt Readings from Last 3 Encounters:  02/16/19 215 lb (97.5 kg)  02/09/19 215 lb (97.5 kg)  01/29/19 215 lb (97.5 kg)     STUDIES:   I have reviewed the following:  CT head:  1. No evidence of acute intracranial abnormality. 2. Mild chronic small vessel ischemic disease  CTA head:  1. Intracranial atherosclerotic disease as detailed. 2. Regions of mild-to-moderate narrowing within the bilateral intracranial ICAs. No definite high-grade stenoses identified within these vessels. 3. The non dominant intracranial left vertebral artery is patent, although with prominent calcified plaque and regions of at least moderate stenosis.  CTA neck:  1. Atherosclerotic plaque within the major branch vessels of the neck as detailed. 2. Near complete occlusion of the proximal right ICA versus less likely complete occlusion with immediate reconstitution. Consider carotid artery duplex for further evaluation. 3. Apparent 50% narrowing of the proximal left ICA. 4. Non dominant left vertebral artery occluded at its origin. Distal to this, the left vertebral artery is markedly irregular within the neck with only faint intermittent opacification seen. 5. Heterogeneous thyroid gland with multiple nodules measuring up to 9 mm.  ASSESSMENT and PLAN   Symptomatic right carotid stenosis: I have reviewed her CT scan and feel that the patient is a candidate for a right-sided TCAR.  I discussed the risks and benefits of the procedure with the patient including the neck incision, femoral vein cannulation site, and flow reversal system.  We also discussed the  0.6% risk of stroke.  All of her questions have been answered.  I am giving her a prescription for Plavix to start today.  She was instructed to continue to take her aspirin and her statin.  I have scheduled her procedure for Wednesday, October 7    Time:   Today, I have spent 16 minutes with the patient with telehealth technology discussing the above problems.     Leia Alf, MD, FACS [facility] [phone] Pager [phone]

## 2019-02-23 NOTE — Progress Notes (Signed)
Call to patient. Instructed to be at [facility] admitting at 6:30 am on 03/04/2019 for TCAR. To continue and take morning of surgery: ASA, Crestor and Plavix(Dr. Brabham sent Rx today for Plavix). NPO past MN night prior except for medications. Expect a call and follow the detailed surgery instructions and nasal swab testing instructions received from the hospital preadmission department. Patient wrote down instructions and verbalized understanding.

## 2019-02-23 NOTE — H&P (View-Only) (Signed)
[facility]  Patient name: Carrie Monroe MRN: [id number] DOB: 01/27/1936 Sex: female      Virtual Visit via Telephone Note   This visit type was conducted due to national recommendations for restrictions regarding the COVID-19 Pandemic (e.g. social distancing) in an effort to limit this patient's exposure and mitigate transmission in our community.  Due to her co-morbid illnesses, this patient is at least at moderate risk for complications without adequate follow up.  This format is felt to be most appropriate for this patient at this time.  The patient did not have access to video technology/had technical difficulties with video requiring transitioning to audio format only (telephone).  All issues noted in this document were discussed and addressed.  No physical exam could be performed with this format.   Patient Location: Home Provider Location: Office    REASON FOR APPOINTMENT:    Follow up   HISTORY OF PRESENT ILLNESS:   Carrie Monroe is a 83 y.o. female, who returns today for follow-up.  She is well-known to me having undergone emergent repair after bleeding complication following TAVR.  She has had a known right carotid stenosis but has been refusing intervention.  She recently began having symptoms of dizziness and is concerned that this may be secondary to her carotid disease.  I sent her for a CT scan to determine whether or not she would be a candidate for TCAR  She says she is having a bad day today.  She is very dizzy.  She is having a family member present for conversation because her memory has deteriorated.  She continues to take a statin for hypercholesterolemia.  She is on single agent antiplatelet therapy with an aspirin.  She is medically managed for hypertension.   The patient does not have symptoms concerning for COVID-19 infection (fever, chills, cough, or new shortness of breath).   PAST MEDICAL HISTORY     Past Medical History:  Diagnosis Date  . Aortic stenosis, severe    a. 01/2017: s/p TAVR; hospital course complicated by large groin hematoma/wound  . Arthritis    "fingers" (11/06/2017)  . Cancer of left breast ([place]) 2009   s/p lumpectomy and XRT  . Carotid artery stenosis    a. 11/2016: 80-99% RICA stenosis, 1-39% vs low range 20-25% LICA   . Chronic diastolic CHF (congestive heart failure) ([place])   . CKD (chronic kidney disease)   . Coronary artery disease    a. 11/2016: diagnosed with multivessel CAD, turned down for CABG and underwent PCI/DES to mLCx, PCI/DES to mLAD and PCTA of ostial diagonal on 12/28/16  . Diabetes mellitus type 2, diet-controlled ([place])   . Exogenous obesity   . Gout    "on daily RX" (11/06/2017)  . Heart murmur   . Hemorrhoids   . History of blood transfusion 01/2017   "28 pints"  . History of kidney stones   . Hyperlipidemia   . Hypertension   . 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 Squamous carcinoma 10/2017   "scalp"     FAMILY HISTORY   Family History  Problem Relation Age of Onset  . Cancer Mother        pancreatic  . Heart disease Father   . Hypertension Father   . Cancer Brother   .  Diabetes Sister     SOCIAL HISTORY:   Social History   Socioeconomic History  . Marital status: Married    Spouse name: Not on file  . Number of children: 4  . Years of education: Not on file  . Highest education level: Not on file  Occupational History  . 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 Financial resource strain: Not on file  . Food insecurity    Worry: Not on file    Inability: Not on file  . Transportation needs    Medical: Not on file    Non-medical: Not on file  Tobacco Use  . Smoking status: Never Smoker  . Smokeless tobacco: Never Used  Substance and Sexual Activity  .  Alcohol use: Never    Frequency: Never  . Drug use: Never  . Sexual activity: Not Currently  Lifestyle  . Physical activity    Days per week: Not on file    Minutes per session: Not on file  . Stress: Not on file  Relationships  . Social Herbalist on phone: Not on file    Gets together: Not on file    Attends religious service: Not on file    Active member of club or organization: Not on file    Attends meetings of clubs or organizations: Not on file    Relationship status: Not on file  . Intimate partner violence    Fear of current or ex partner: Not on file    Emotionally abused: Not on file    Physically abused: Not on file    Forced sexual activity: Not on file  Other Topics Concern  . Not on file  Social History Narrative   Epworth Sleepiness scale score =11 as of 01/25/16    ALLERGIES:    Allergies  Allergen Reactions  . Oxycodone Other (See Comments)    Hallunication    CURRENT MEDICATIONS:    Current Outpatient Medications  Medication Sig Dispense Refill  . aspirin 81 MG chewable tablet Chew 1 tablet (81 mg total) by mouth daily.    . Black Elderberry 50 MG/5ML SYRP Take by mouth.    . Cholecalciferol (VITAMIN D PO) Take 1 capsule by mouth daily.    . COLLAGEN PO Take by mouth daily. power     . Cyanocobalamin (VITAMIN B 12 PO) Take by mouth.    . furosemide (LASIX) 40 MG tablet Take 1 tablet (40 mg total) by mouth daily as needed for fluid or edema. 90 tablet 1  . hydrALAZINE (APRESOLINE) 50 MG tablet Take 75 mg by mouth every 8 (eight) hours.     . rosuvastatin (CRESTOR) 10 MG tablet Take 10 mg by mouth daily.   0  . Zinc 50 MG TABS Take by mouth.    . olmesartan (BENICAR) 20 MG tablet Take 20 mg by mouth daily.     No current facility-administered medications for this visit.     REVIEW OF SYSTEMS:   Please see the history of present illness.     All other systems reviewed and are negative.  PHYSICAL EXAM:   There were no vitals  filed for this visit.  GENERAL: The patient is no acute distress PULMONARY: Nonlabored breathing NEUROLOGIC: No slurred speech PSYCHIATRIC: The patient has a normal affect.   Recent Labs: No results found for requested labs within last 8760 hours.   Recent Lipid Panel No results found for: CHOL, TRIG, HDL, CHOLHDL, LDLCALC,  LDLDIRECT  Wt Readings from Last 3 Encounters:  02/16/19 215 lb (97.5 kg)  02/09/19 215 lb (97.5 kg)  01/29/19 215 lb (97.5 kg)     STUDIES:   I have reviewed the following:  CT head:  1. No evidence of acute intracranial abnormality. 2. Mild chronic small vessel ischemic disease  CTA head:  1. Intracranial atherosclerotic disease as detailed. 2. Regions of mild-to-moderate narrowing within the bilateral intracranial ICAs. No definite high-grade stenoses identified within these vessels. 3. The non dominant intracranial left vertebral artery is patent, although with prominent calcified plaque and regions of at least moderate stenosis.  CTA neck:  1. Atherosclerotic plaque within the major branch vessels of the neck as detailed. 2. Near complete occlusion of the proximal right ICA versus less likely complete occlusion with immediate reconstitution. Consider carotid artery duplex for further evaluation. 3. Apparent 50% narrowing of the proximal left ICA. 4. Non dominant left vertebral artery occluded at its origin. Distal to this, the left vertebral artery is markedly irregular within the neck with only faint intermittent opacification seen. 5. Heterogeneous thyroid gland with multiple nodules measuring up to 9 mm.  ASSESSMENT and PLAN   Symptomatic right carotid stenosis: I have reviewed her CT scan and feel that the patient is a candidate for a right-sided TCAR.  I discussed the risks and benefits of the procedure with the patient including the neck incision, femoral vein cannulation site, and flow reversal system.  We also discussed the  0.6% risk of stroke.  All of her questions have been answered.  I am giving her a prescription for Plavix to start today.  She was instructed to continue to take her aspirin and her statin.  I have scheduled her procedure for Wednesday, October 7    Time:   Today, I have spent 16 minutes with the patient with telehealth technology discussing the above problems.     Leia Alf, MD, FACS [facility] [phone] Pager [phone]

## 2019-02-24 ENCOUNTER — Telehealth: Payer: Self-pay | Admitting: Cardiovascular Disease

## 2019-02-24 NOTE — Telephone Encounter (Signed)
Patient states since the increase in hydralazine to three times daily- per last office visit: She continues to have a significant amount of dizziness.  She is no longer taking diuretics.  Due to chronic kidney disease I am going to discontinue Benicar, increase hydralazine to 75 mg 3 times daily.  She states it has been worse than before with the increase, no other changes to anything else- and it is not helping her BP as it was 184/85 just now.  Advised patient I would route message to NP as she wanted to know how she did on the increase medication and if any recommendations.

## 2019-02-24 NOTE — Telephone Encounter (Signed)
Please have her see the Pharmacist to evaluate the medications she is taking.  She has been having difficult to control hypertension with associated symptoms. She was having elevated BP in the middle of the day. She can decrease the dose to 50 mg TID and start back on Benicar 40 mg daily.

## 2019-02-24 NOTE — Telephone Encounter (Signed)
° ° °  Patient calling to report dizziness. Started after medication changes last office visit  STAT if patient feels like he/she is going to faint   1) Are you dizzy now? yes  2) Do you feel faint or have you passed out? no  3) Do you have any other symptoms? no  4) Have you checked your HR and BP (record if available)? 176/84 HR 63

## 2019-02-24 NOTE — Telephone Encounter (Signed)
Patient spoke with me earlier- and said she wouldn't want to change anything yet as the other meds caused her dizziness as well. Will have patient wait for appointment tomorrow at 9:30 to discuss with PharmD to see if they can assist her.

## 2019-02-24 NOTE — Telephone Encounter (Signed)
° °  Patient confirmed 9/30 appt w/Pharm D  Patient is requesting a call from nurse to discuss medications

## 2019-02-24 NOTE — Telephone Encounter (Signed)
Called and notified patient of changes- she states those also make her dizzy, I advised patient that she should come in and see PharmD to discuss her issues, and try to get assistance.  Patient made appointment for tomorrow morning- will route to PharmD to make aware of situation. She states she will call and cancel if she can not get a ride.

## 2019-02-25 ENCOUNTER — Other Ambulatory Visit: Payer: Self-pay

## 2019-02-25 ENCOUNTER — Ambulatory Visit (INDEPENDENT_AMBULATORY_CARE_PROVIDER_SITE_OTHER): Payer: Medicare Other | Admitting: Pharmacist Clinician (PhC)/ Clinical Pharmacy Specialist

## 2019-02-25 DIAGNOSIS — I10 Essential (primary) hypertension: Secondary | ICD-10-CM | POA: Diagnosis not present

## 2019-02-25 MED ORDER — AMLODIPINE BESYLATE 5 MG PO TABS: 5.0000 mg | ORAL_TABLET | Freq: Every day | ORAL | 3 refills | Status: DC

## 2019-02-25 NOTE — Patient Instructions (Addendum)
Return for a a follow up appointment in November  Your blood pressure today is 162/60  Check your blood pressure at home daily and keep record of the readings.  Take your BP meds as follows:  Continue hydralazine 50 mg (1 tablet) three times daily  Start amlodipine 5 mg once daily at night  Bring all of your meds, your BP cuff and your record of home blood pressures to your next appointment.  Exercise as you're able, try to walk approximately 30 minutes per day.  Keep salt intake to a minimum, especially watch canned and prepared boxed foods.  Eat more fresh fruits and vegetables and fewer canned items.  Avoid eating in fast food restaurants.    HOW TO TAKE YOUR BLOOD PRESSURE: . Rest 5 minutes before taking your blood pressure. .  Don't smoke or drink caffeinated beverages for at least 30 minutes before. . Take your blood pressure before (not after) you eat. . Sit comfortably with your back supported and both feet on the floor (don't cross your legs). . Elevate your arm to heart level on a table or a desk. . Use the proper sized cuff. It should fit smoothly and snugly around your bare upper arm. There should be enough room to slip a fingertip under the cuff. The bottom edge of the cuff should be 1 inch above the crease of the elbow. . Ideally, take 3 measurements at one sitting and record the average.

## 2019-02-25 NOTE — Assessment & Plan Note (Signed)
Patient with essential hypertension, currently not well controlled, only on hydralazine 50 mg tid.  She has had problems with olmesartan in the past and does not wish to re-challenge with it.   Will have her start amlodipine 5 mg daily at bedtime.  Because of her ongoing dizziness and surgery scheduled for next week, I explained to her that she may not see a steady drop in her pressure for a week or more.  She should take at bedtime to avoid any potential added dizziness, although I wouldn't expect the medication to cause this.  We will see her back in about 5-6 weeks to review BP and determine next course of action.  Answered all her questions and explained the need for most patients to be on 2-3 medications for optimal control.

## 2019-02-25 NOTE — Progress Notes (Signed)
02/25/2019 Carrie Monroe [birth year] [id number]   HPI:  Carrie Monroe is a 83 y.o. female patient of Dr Carrie Monroe, with a PMH below who presents today for hypertension clinic evaluation.  Patient has been having ongoing problem with dizziness.  At her last visit with Carrie Monroe (on 9/21) then olmesartan was discontinued and hydralazine increased to 75 mg tid (from 50 mg tid).  However she called a week later to report dizziness still problematic and home BP readings as high as 326'Z systolic.  It was suggested that she decrease hydralazine back to 50 mg tid and resume olmesartan, however patient chose to wait until OV this morning to discuss options.    Carrie Monroe is scheduled to have surgery next Wednesday - right trans-carotid artery revascularization.  It is hoped that this will correct her ongoing dizziness.  Today in the office her O2 saturation dropped to 86% walking into the exam room, but quickly returned to > 90% after sitting down.    Past Medical History: CAD CPI with stents to LAD and RCA (12/2016), 80-99% R internal carotid stenosis  CKD SCr 1.3 on 9/2 ([facility]), down from 1.5 prior month.  CrCl 53.1 (28.6 with IBW)  Diastolic CHF EF 12-45%  hyperlipidemia TC 120, TG 71, HDL 35, LDL 71 (12/2018 [facility])  DM2 A1c 5.0 (12/2018) down from 6.3 in 2018  [facility]  obesity BMI 38      Blood Pressure Goal:  130/80  Current Medications: hydralazine 75 mg tid,  Family Hx: father died in his 37 on dialysis; mother was 73 pancreatic cancer  2 brothers deceased 1 died suddenly at 21 (? SCD?)  1 sister with diabetes  4 children, no known hypertension  Social Hx: no tobacco, no alcohol; no regular caffeine  Diet:  Eating Freshly - 6 meals per week; mostly chicken, occasionally sausage; with vegetables; rice/potatoes included as well; snacks consist of popcorn; drinks fruit smoothies with almond milk  Exercise:  Not currently due to dizziness  Home BP readings: no readings with  her.  Aide lists 152/88, 199/82, mostly 809-983 systolic, mid 38'S diastolic.  Wrist cuff, reads about 10 pts higher than [facility] arm cuff  Intolerances:  Previously took lisinopril 20 mg - not sure when or why this was discontinued   olmesartan made her feel like a zombie -   Labs:  01/2019:  Na 140, Glu 112, BUN 39, SCr 1.3.  Last K level was from 10/2017 at 4.1   Wt Readings from Last 3 Encounters:  02/16/19 215 lb (97.5 kg)  02/09/19 215 lb (97.5 kg)  01/29/19 215 lb (97.5 kg)   BP Readings from Last 3 Encounters:  02/25/19 (!) 162/60  02/16/19 (!) 167/73  02/09/19 (!) 161/65   Pulse Readings from Last 3 Encounters:  02/25/19 78  02/16/19 68  02/09/19 62    Current Outpatient Medications  Medication Sig Dispense Refill  . Ascorbic Acid (VITAMIN C) 1000 MG tablet Take 1,000 mg by mouth daily.    Marland Kitchen aspirin EC 81 MG tablet Take 81 mg by mouth at bedtime.    . Black Elderberry 50 MG/5ML SYRP Take 5 mLs by mouth daily.     . Cholecalciferol (VITAMIN D PO) Take 2,000 Units by mouth daily.     . clopidogrel (PLAVIX) 75 MG tablet Take 1 tablet (75 mg total) by mouth daily. 30 tablet 11  . COLLAGEN PO Take 5 g by mouth daily. power     .  Cyanocobalamin (VITAMIN B 12 PO) Take 1 mL by mouth daily. liquid    . furosemide (LASIX) 40 MG tablet Take 1 tablet (40 mg total) by mouth daily as needed for fluid or edema. 90 tablet 1  . hydrALAZINE (APRESOLINE) 50 MG tablet Take 75 mg by mouth 3 (three) times daily.     . Multiple Vitamins-Minerals (ZINC PO) Take 3.75 mg by mouth daily.    Marland Kitchen olmesartan (BENICAR) 20 MG tablet Take 20 mg by mouth daily.    . rosuvastatin (CRESTOR) 10 MG tablet Take 10 mg by mouth daily.   0  . amLODipine (NORVASC) 5 MG tablet Take 1 tablet (5 mg total) by mouth daily. 90 tablet 3   No current facility-administered medications for this visit.     Allergies  Allergen Reactions  . Oxycodone Other (See Comments)    Hallunication    Past Medical History:   Diagnosis Date  . Aortic stenosis, severe    a. 01/2017: s/p TAVR; hospital course complicated by large groin hematoma/wound  . Arthritis    "fingers" (11/06/2017)  . Cancer of left breast ([place]) 2009   s/p lumpectomy and XRT  . Carotid artery stenosis    a. 11/2016: 80-99% RICA stenosis, 1-39% vs low range 44-03% LICA   . Chronic diastolic CHF (congestive heart failure) ([place])   . CKD (chronic kidney disease)   . Coronary artery disease    a. 11/2016: diagnosed with multivessel CAD, turned down for CABG and underwent PCI/DES to mLCx, PCI/DES to mLAD and PCTA of ostial diagonal on 12/28/16  . Diabetes mellitus type 2, diet-controlled ([place])   . Exogenous obesity   . Gout    "on daily RX" (11/06/2017)  . Heart murmur   . Hemorrhoids   . History of blood transfusion 01/2017   "28 pints"  . History of kidney stones   . Hyperlipidemia   . Hypertension   . 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 Squamous carcinoma 10/2017   "scalp"    Blood pressure (!) 162/60, pulse 78, resp. rate 17, height 5\' 3"  (1.6 m), SpO2 90 %.  Essential hypertension Patient with essential hypertension, currently not well controlled, only on hydralazine 50 mg tid.  She has had problems with olmesartan in the past and does not wish to re-challenge with it.   Will have her start amlodipine 5 mg daily at bedtime.  Because of her ongoing dizziness and surgery scheduled for next week, I explained to her that she may not see a steady drop in her pressure for a week or more.  She should take at bedtime to avoid any potential added dizziness, although I wouldn't expect the medication to cause this.  We will see her back in about 5-6 weeks to review BP and determine next course of action.  Answered all her questions and explained the need for most patients to be on 2-3 medications for optimal  control.     Carrie Monroe PharmD CPP [facility] [address] [place], [place] [address] [phone]

## 2019-02-27 NOTE — Progress Notes (Signed)
[facility] - [place] - [address] [place] [address] Phone: [phone] Fax: [phone]  [facility] ORDER) [place], [place] [address] Phone: [phone] Fax: [phone]      Your procedure is scheduled on October 7th.  Report to [facility] Main Entrance "A" at [place].M., and check in at the Admitting office.  Call this number if you have problems the morning of surgery:  [phone]  Call [phone] if you have any questions prior to your surgery date Monday-Friday 8am-4pm    Remember:  Do not eat or drink after midnight the night before your surgery    Take these medicines the morning of surgery with A SIP OF WATER  amLODipine (NORVASC) rosuvastatin (CRESTOR) 10 MG tablet  Follow your surgeon's instructions on when to stop Aspirin and Plavix.  If no instructions were given by your surgeon then you will need to call the office to get those instructions.     As of today, STOP taking any Aspirin (unless otherwise instructed by your surgeon), Aleve, Naproxen, Ibuprofen, Motrin, Advil, Goody's, BC's, all herbal medications, fish oil, and all vitamins.    The Morning of Surgery  Do not wear jewelry, make-up or nail polish.  Do not wear lotions, powders, or perfumes, or deodorant  Do not shave 48 hours prior to surgery.    Do not bring valuables to the hospital.  [facility] is not responsible for any belongings or valuables.  If you are a smoker, DO NOT Smoke 24 hours prior to surgery IF you wear a CPAP at night please bring your mask, tubing, and machine the morning of surgery   Remember that you must have someone to transport you home after your surgery, and remain with you for 24 hours if you are discharged the same day.   Contacts, glasses, hearing aids, dentures or bridgework may not be worn into surgery.     Leave your suitcase in the car.  After surgery it may be brought to your room.  For patients admitted to the hospital, discharge time will be determined by your treatment team.  Patients discharged the day of surgery will not be allowed to drive home.    Special instructions:   [place]- Preparing For Surgery  Before surgery, you can play an important role. Because skin is not sterile, your skin needs to be as free of germs as possible. You can reduce the number of germs on your skin by washing with CHG (chlorahexidine gluconate) Soap before surgery.  CHG is an antiseptic cleaner which kills germs and bonds with the skin to continue killing germs even after washing.    Oral Hygiene is also important to reduce your risk of infection.  Remember - BRUSH YOUR TEETH THE MORNING OF SURGERY WITH YOUR REGULAR TOOTHPASTE  Please do not use if you have an allergy to CHG or antibacterial soaps. If your skin becomes reddened/irritated stop using the CHG.  Do not shave (including legs and underarms) for at least 48 hours prior to first CHG shower. It is OK to shave your face.  Please follow these instructions carefully.   1. Shower the NIGHT BEFORE SURGERY and the MORNING OF SURGERY with CHG Soap.   2. If you chose to wash your hair, wash your hair first as usual with your normal shampoo.  3. After you shampoo, rinse your hair  and body thoroughly to remove the shampoo.  4. Use CHG as you would any other liquid soap. You can apply CHG directly to the skin and wash gently with a scrungie or a clean washcloth.   5. Apply the CHG Soap to your body ONLY FROM THE NECK DOWN.  Do not use on open wounds or open sores. Avoid contact with your eyes, ears, mouth and genitals (private parts). Wash Face and genitals (private parts)  with your normal soap.   6. Wash thoroughly, paying special attention to the area where your surgery will be performed.  7. Thoroughly rinse your body with warm water from  the neck down.  8. DO NOT shower/wash with your normal soap after using and rinsing off the CHG Soap.  9. Pat yourself dry with a CLEAN TOWEL.  10. Wear CLEAN PAJAMAS to bed the night before surgery, wear comfortable clothes the morning of surgery  11. Place CLEAN SHEETS on your bed the night of your first shower and DO NOT SLEEP WITH PETS.    Day of Surgery:  Do not apply any deodorants/lotions. Please shower the morning of surgery with the CHG soap  Please wear clean clothes to the hospital/surgery center.   Remember to brush your teeth WITH YOUR REGULAR TOOTHPASTE.   Please read over the following fact sheets that you were given.

## 2019-03-02 ENCOUNTER — Other Ambulatory Visit: Payer: Self-pay

## 2019-03-02 ENCOUNTER — Encounter (HOSPITAL_COMMUNITY)
Admission: RE | Admit: 2019-03-02 | Discharge: 2019-03-02 | Disposition: A | Discharge status: Home / Self Care | Payer: Medicare Other | Source: Ambulatory Visit | Attending: Surgery | Admitting: Surgery

## 2019-03-02 ENCOUNTER — Other Ambulatory Visit (HOSPITAL_COMMUNITY)
Admission: RE | Admit: 2019-03-02 | Discharge: 2019-03-02 | Disposition: A | Discharge status: Home / Self Care | Payer: Medicare Other | Source: Ambulatory Visit | Attending: Surgery | Admitting: Surgery

## 2019-03-02 ENCOUNTER — Encounter (HOSPITAL_COMMUNITY): Payer: Self-pay

## 2019-03-02 DIAGNOSIS — Z5309 Procedure and treatment not carried out because of other contraindication: Secondary | ICD-10-CM | POA: Diagnosis not present

## 2019-03-02 DIAGNOSIS — Z01812 Encounter for preprocedural laboratory examination: Secondary | ICD-10-CM | POA: Insufficient documentation

## 2019-03-02 DIAGNOSIS — N390 Urinary tract infection, site not specified: Secondary | ICD-10-CM | POA: Diagnosis not present

## 2019-03-02 DIAGNOSIS — I2510 Atherosclerotic heart disease of native coronary artery without angina pectoris: Secondary | ICD-10-CM | POA: Diagnosis not present

## 2019-03-02 DIAGNOSIS — I6523 Occlusion and stenosis of bilateral carotid arteries: Secondary | ICD-10-CM | POA: Diagnosis present

## 2019-03-02 DIAGNOSIS — N189 Chronic kidney disease, unspecified: Secondary | ICD-10-CM | POA: Diagnosis not present

## 2019-03-02 DIAGNOSIS — Z952 Presence of prosthetic heart valve: Secondary | ICD-10-CM | POA: Diagnosis not present

## 2019-03-02 DIAGNOSIS — Z955 Presence of coronary angioplasty implant and graft: Secondary | ICD-10-CM | POA: Diagnosis not present

## 2019-03-02 DIAGNOSIS — Z20828 Contact with and (suspected) exposure to other viral communicable diseases: Secondary | ICD-10-CM | POA: Insufficient documentation

## 2019-03-02 LAB — APTT: aPTT: 28 seconds (ref 24–36)

## 2019-03-02 LAB — URINALYSIS, ROUTINE W REFLEX MICROSCOPIC
Bilirubin Urine: NEGATIVE
Glucose, UA: NEGATIVE mg/dL
Hgb urine dipstick: NEGATIVE
Ketones, ur: NEGATIVE mg/dL
Nitrite: NEGATIVE
Protein, ur: 100 mg/dL — AB
Specific Gravity, Urine: 1.005 (ref 1.005–1.030)
pH: 7 (ref 5.0–8.0)

## 2019-03-02 LAB — COMPREHENSIVE METABOLIC PANEL
ALT: 14 U/L (ref 0–44)
AST: 21 U/L (ref 15–41)
Albumin: 3.7 g/dL (ref 3.5–5.0)
Alkaline Phosphatase: 118 U/L (ref 38–126)
Anion gap: 7 (ref 5–15)
BUN: 28 mg/dL — ABNORMAL HIGH (ref 8–23)
CO2: 25 mmol/L (ref 22–32)
Calcium: 9.8 mg/dL (ref 8.9–10.3)
Chloride: 107 mmol/L (ref 98–111)
Creatinine, Ser: 1.35 mg/dL — ABNORMAL HIGH (ref 0.44–1.00)
GFR calc Af Amer: 43 mL/min — ABNORMAL LOW (ref >60)
GFR calc non Af Amer: 37 mL/min — ABNORMAL LOW (ref >60)
Glucose, Bld: 107 mg/dL — ABNORMAL HIGH (ref 70–99)
Potassium: 4.7 mmol/L (ref 3.5–5.1)
Sodium: 139 mmol/L (ref 135–145)
Total Bilirubin: 0.5 mg/dL (ref 0.3–1.2)
Total Protein: 6.7 g/dL (ref 6.5–8.1)

## 2019-03-02 LAB — GLUCOSE, CAPILLARY: Glucose-Capillary: 111 mg/dL — ABNORMAL HIGH (ref 70–99)

## 2019-03-02 LAB — PROTIME-INR
INR: 1.1 (ref 0.8–1.2)
Prothrombin Time: 13.7 seconds (ref 11.4–15.2)

## 2019-03-02 LAB — CBC
HCT: 34.4 % — ABNORMAL LOW (ref 36.0–46.0)
Hemoglobin: 11 g/dL — ABNORMAL LOW (ref 12.0–15.0)
MCH: 32.2 pg (ref 26.0–34.0)
MCHC: 32 g/dL (ref 30.0–36.0)
MCV: 100.6 fL — ABNORMAL HIGH (ref 80.0–100.0)
Platelets: 130 10*3/uL — ABNORMAL LOW (ref 150–400)
RBC: 3.42 MIL/uL — ABNORMAL LOW (ref 3.87–5.11)
RDW: 13.3 % (ref 11.5–15.5)
WBC: 4.9 10*3/uL (ref 4.0–10.5)
nRBC: 0 % (ref 0.0–0.2)

## 2019-03-02 LAB — SURGICAL PCR SCREEN
MRSA, PCR: NEGATIVE
Staphylococcus aureus: NEGATIVE

## 2019-03-02 LAB — SARS CORONAVIRUS 2 (TAT 6-24 HRS): SARS Coronavirus 2: NEGATIVE

## 2019-03-02 NOTE — Progress Notes (Addendum)
  Coronavirus Screening Got tested for Covid today Have you experienced the following symptoms:  Cough yes/no: No Fever (>100.65F)  yes/no: No Runny nose yes/no: No Sore throat yes/no: No Difficulty breathing/shortness of breath  yes/no: No Loss of smell or taste-No Have you or a family member traveled in the last 14 days and where? yes/no: No  PCP - DR Shaw,William  Cardiologist - Dr Sallyanne Kuster, [facility]  Chest x-ray -   EKG - 01-21-19  Stress Test - 12-29-16  ECHO - 01-28-19  Cardiac Cath - denies  AICD-denies PM-denies LOOP-denies  Sleep Study - denies CPAP - NA  LABS-PCR, CBC,CMP,PT-INR,APTT,T/S .  Abnormal UA- MD notified.  Pt instructed to continue taking Statin, Aspirin & Plavix including DOS.  ERAS-denies  HA1C-denies Fasting Blood Sugar - 111 Lo-100's    Hi-200's Checks Blood Sugar _0____ times a day  Anesthesia-Y  CHF, DM-2,CKD  Pt denies having chest pain, sob, or fever at this time. Pt c/o ongoing dizziness, which was addressed at her last appointment with Dr Trula Slade. BP= 132/72 , HR=69. All instructions explained to the pt, with a verbal understanding of the material. Pt agrees to go over the instructions while at home for a better understanding. Pt also instructed to self quarantine after being tested for COVID-19. The opportunity to ask questions was provided.

## 2019-03-03 ENCOUNTER — Encounter (HOSPITAL_COMMUNITY): Payer: Self-pay | Admitting: Anesthesiology

## 2019-03-03 ENCOUNTER — Encounter (HOSPITAL_COMMUNITY): Payer: Self-pay | Admitting: Physician Assistant

## 2019-03-03 NOTE — Anesthesia Preprocedure Evaluation (Deleted)
Anesthesia Evaluation    Airway        Dental   Pulmonary neg pulmonary ROS,           Cardiovascular hypertension, Pt. on medications + CAD, + Cardiac Stents, + Peripheral Vascular Disease and +CHF  + Valvular Problems/Murmurs (s/p TAVR)   S/p TAVR. EF 45-50%. Mean gradient of 44mHg.   Neuro/Psych negative neurological ROS     GI/Hepatic negative GI ROS, Neg liver ROS,   Endo/Other  diabetes  Renal/GU Renal disease     Musculoskeletal   Abdominal   Peds  Hematology   Anesthesia Other Findings   Reproductive/Obstetrics                            Lab Results  Component Value Date   WBC 4.9 03/02/2019   HGB 11.0 (L) 03/02/2019   HCT 34.4 (L) 03/02/2019   MCV 100.6 (H) 03/02/2019   PLT 130 (L) 03/02/2019   Lab Results  Component Value Date   CREATININE 1.35 (H) 03/02/2019   BUN 28 (H) 03/02/2019   NA 139 03/02/2019   K 4.7 03/02/2019   CL 107 03/02/2019   CO2 25 03/02/2019    Anesthesia Physical Anesthesia Plan  ASA: III  Anesthesia Plan: General   Post-op Pain Management:    Induction: Intravenous  PONV Risk Score and Plan:   Airway Management Planned: Oral ETT  Additional Equipment: Arterial line  Intra-op Plan:   Post-operative Plan: Extubation in OR  Informed Consent:   Plan Discussed with:   Anesthesia Plan Comments: (Follows with cardiology for hx of CAD s/p PCI with stents to the LAD and RCA in August 2018, aortic stenosis s/p TAVR in September 20962  diastolic CHF, carotid disease. She was seen by Dr. CSallyanne Kusteron 01/29/2019 with complaints of severe dizziness. She was recommended for surgery due to vascular duplex ultrasound in January 2019 revealing 80% to 90% R ICA stenosis.  She refused at that time. Repeat TTE revealed normal LV revealed normal function of the aortic valve prosthesis, left ventricular systolic function was borderline depressed with an EF of 45%  to 50% primarily due to left bundle branch block related dyssynchrony. She was seen by KJory Sims NP on 9/21 with c/o blood pressures running as high as 1836systolic as well as persistent dizziness. Pt had medication adjustments made by cardiology and subsequently by pharmacy. BP at PAT 132/72.  Repeat carotid ultrasound on 02/09/2019 revealed velocities in the right ICA  consistent with 80 to 962%stenosis, LICA are consistent with 40% to 59% stenosis.  Normal flow hemodynamics were seen in the bilateral subclavian arteries. CTA neck showed near complete occlusion of the proximal right ICA versus less likely complete occlusion with immediate reconstitution. Dr. BTrula Sladerecommended TCAR for treatment of symptomatic R ICA stensois. Pt will remain on ASA, statin, plavix per protocol.   Hx of CKD with baseline creatinine 1.2-1.3. Creatinine on preop labs 1.35.  TTE 01/28/19:  1. A 26 CoreValve-Evolut Pro bioprosthetic aortic valve (TAVR) valve is present in the aortic position. Procedure Date: 01/2017 Normal aortic valve prosthesis.  2. 26 mm evolute corevalve present in aortic position. Vmax 2.6 m/s. Peak/mean gradients 27/14 mmHG. EOA 1.47 cm2. Normal functioning prosthetic valve with stable measurements compared with prior (03/06/2017). No regurgitation or paravalvular leak  detected.  3. The left ventricle has mildly reduced systolic function, with an ejection fraction of 45-50%. The cavity size was normal. indeterminate  diastolic function due to MAC. There is abnormal septal motion consistent with left bundle branch block.  4. Left atrial size was moderately dilated.  5. The mitral valve is degenerative. Mild thickening of the mitral valve leaflet. Mild calcification of the mitral valve leaflet. There is moderate mitral annular calcification present.  6. The tricuspid valve is grossly normal.  7. The aorta is normal unless otherwise noted.  8. The aortic root is normal in size and structure.  9.  The right ventricle has normal systolic function. The cavity was moderately enlarged. There is no increase in right ventricular wall thickness. 10. Right atrial size was moderately dilated. 11. When compared to the prior study: Compared with prior (11/06/17), gradients across TAVR are slightly increased but within normal limits for this valve.  )       Anesthesia Quick Evaluation

## 2019-03-04 ENCOUNTER — Ambulatory Visit (HOSPITAL_COMMUNITY)
Admission: RE | Admit: 2019-03-04 | Discharge: 2019-03-04 | Disposition: A | Discharge status: Home / Self Care | Payer: Medicare Other | Source: Ambulatory Visit | Attending: Surgery | Admitting: Surgery

## 2019-03-04 ENCOUNTER — Telehealth: Payer: Self-pay | Admitting: Surgery

## 2019-03-04 ENCOUNTER — Encounter (HOSPITAL_COMMUNITY)
Admission: RE | Disposition: A | Discharge status: Home / Self Care | Payer: Self-pay | Source: Ambulatory Visit | Attending: Surgery

## 2019-03-04 ENCOUNTER — Inpatient Hospital Stay (HOSPITAL_COMMUNITY): Payer: Medicare Other

## 2019-03-04 ENCOUNTER — Encounter (HOSPITAL_COMMUNITY): Payer: Self-pay | Admitting: Certified Registered"

## 2019-03-04 ENCOUNTER — Other Ambulatory Visit: Payer: Self-pay | Admitting: *Deleted

## 2019-03-04 DIAGNOSIS — I2510 Atherosclerotic heart disease of native coronary artery without angina pectoris: Secondary | ICD-10-CM | POA: Insufficient documentation

## 2019-03-04 DIAGNOSIS — Z5309 Procedure and treatment not carried out because of other contraindication: Secondary | ICD-10-CM | POA: Insufficient documentation

## 2019-03-04 DIAGNOSIS — N189 Chronic kidney disease, unspecified: Secondary | ICD-10-CM | POA: Insufficient documentation

## 2019-03-04 DIAGNOSIS — I6523 Occlusion and stenosis of bilateral carotid arteries: Secondary | ICD-10-CM | POA: Diagnosis not present

## 2019-03-04 DIAGNOSIS — Z952 Presence of prosthetic heart valve: Secondary | ICD-10-CM | POA: Insufficient documentation

## 2019-03-04 DIAGNOSIS — N390 Urinary tract infection, site not specified: Secondary | ICD-10-CM | POA: Insufficient documentation

## 2019-03-04 DIAGNOSIS — Z955 Presence of coronary angioplasty implant and graft: Secondary | ICD-10-CM | POA: Insufficient documentation

## 2019-03-04 LAB — URINALYSIS, ROUTINE W REFLEX MICROSCOPIC
Bilirubin Urine: NEGATIVE
Glucose, UA: NEGATIVE mg/dL
Hgb urine dipstick: NEGATIVE
Ketones, ur: NEGATIVE mg/dL
Nitrite: NEGATIVE
Protein, ur: 100 mg/dL — AB
Specific Gravity, Urine: 1.009 (ref 1.005–1.030)
WBC, UA: >50 WBC/hpf — ABNORMAL HIGH (ref 0–5)
pH: 7 (ref 5.0–8.0)

## 2019-03-04 LAB — GLUCOSE, CAPILLARY: Glucose-Capillary: 103 mg/dL — ABNORMAL HIGH (ref 70–99)

## 2019-03-04 SURGERY — TRANSCAROTID ARTERY REVASCULARIZATION (TCAR)
Anesthesia: Choice | Laterality: Right

## 2019-03-04 MED ORDER — PROPOFOL 10 MG/ML IV BOLUS
INTRAVENOUS | Status: AC
  Filled 2019-03-04: qty 20

## 2019-03-04 MED ORDER — FENTANYL CITRATE (PF) 250 MCG/5ML IJ SOLN
INTRAMUSCULAR | Status: AC
  Filled 2019-03-04: qty 5

## 2019-03-04 MED ORDER — CIPROFLOXACIN HCL 500 MG PO TABS: 500.0000 mg | ORAL_TABLET | Freq: Two times a day (BID) | ORAL | 0 refills | Status: DC

## 2019-03-04 NOTE — Telephone Encounter (Signed)
Message left on voice mail for patient to call this office to confirm. Surgery rescheduled for 03/13/2019 at 5:30 am arrival to [facility] admitting. To continue and take am of surgery Statin, ASA and Plavix. Expect a call and follow the detailed surgery instructions received from the hospital preadmission department.

## 2019-03-04 NOTE — Progress Notes (Signed)
Repeat UA resulted in positive UTI. Verbal instructions given by Dr. Trula Slade to cancel surgery.

## 2019-03-05 ENCOUNTER — Telehealth: Payer: Self-pay | Admitting: *Deleted

## 2019-03-05 NOTE — Telephone Encounter (Signed)
Phone call to patient and confirmed information for surgery on 03/13/2019. Arrival at 5:30 and continue and take am of surgery statin, asa and Plavix. NPO past MN and will receive call from hospital pre-admission department. Verbalized understanding.

## 2019-03-10 ENCOUNTER — Other Ambulatory Visit (HOSPITAL_COMMUNITY)
Admission: RE | Admit: 2019-03-10 | Discharge: 2019-03-10 | Disposition: A | Discharge status: Home / Self Care | Payer: Medicare Other | Source: Ambulatory Visit | Attending: Surgery | Admitting: Surgery

## 2019-03-10 DIAGNOSIS — Z20828 Contact with and (suspected) exposure to other viral communicable diseases: Secondary | ICD-10-CM | POA: Insufficient documentation

## 2019-03-10 DIAGNOSIS — Z01812 Encounter for preprocedural laboratory examination: Secondary | ICD-10-CM | POA: Insufficient documentation

## 2019-03-11 LAB — NOVEL CORONAVIRUS, NAA (HOSP ORDER, SEND-OUT TO REF LAB; TAT 18-24 HRS): SARS-CoV-2, NAA: NOT DETECTED

## 2019-03-12 ENCOUNTER — Encounter (HOSPITAL_COMMUNITY): Payer: Self-pay | Admitting: *Deleted

## 2019-03-12 ENCOUNTER — Other Ambulatory Visit: Payer: Self-pay

## 2019-03-12 NOTE — Anesthesia Preprocedure Evaluation (Addendum)
Anesthesia Evaluation  Patient identified by MRN, date of birth, ID band Patient awake    Reviewed: Allergy & Precautions, NPO status , Patient's Chart, lab work & pertinent test results  History of Anesthesia Complications Negative for: history of anesthetic complications  Airway Mallampati: II  TM Distance: >3 FB Neck ROM: Full    Dental  (+) Dental Advisory Given   Pulmonary neg pulmonary ROS,    breath sounds clear to auscultation       Cardiovascular hypertension, + CAD, + Peripheral Vascular Disease and +CHF  + Valvular Problems/Murmurs  Rhythm:Regular     Neuro/Psych negative neurological ROS  negative psych ROS   GI/Hepatic   Endo/Other  diabetes  Renal/GU Renal disease     Musculoskeletal  (+) Arthritis ,   Abdominal   Peds  Hematology  (+) anemia ,   Anesthesia Other Findings   Reproductive/Obstetrics                            Anesthesia Physical Anesthesia Plan  ASA: III  Anesthesia Plan: General   Post-op Pain Management:    Induction: Intravenous  PONV Risk Score and Plan: 3 and Ondansetron and Dexamethasone  Airway Management Planned: Oral ETT  Additional Equipment: Arterial line  Intra-op Plan:   Post-operative Plan: Extubation in OR  Informed Consent: I have reviewed the patients History and Physical, chart, labs and discussed the procedure including the risks, benefits and alternatives for the proposed anesthesia with the patient or authorized representative who has indicated his/her understanding and acceptance.     Dental advisory given  Plan Discussed with: CRNA and Surgeon  Anesthesia Plan Comments: (Follows with cardiology for hx of CAD s/p PCI with stents to the LAD and RCA in August 2018, aortic stenosis s/p TAVR in September 5956,  diastolic CHF, carotid disease. She was seen by Dr. Sallyanne Kuster on 01/29/2019 with complaints of severe dizziness. She  was recommended for surgery due to vascular duplex ultrasound in January 2019 revealing 80% to 90% R ICA stenosis.  She refused at that time. Repeat TTE revealed normal LV revealed normal function of the aortic valve prosthesis, left ventricular systolic function was borderline depressed with an EF of 45% to 50% primarily due to left bundle branch block related dyssynchrony. She was seen by Jory Sims, NP on 9/21 with c/o blood pressures running as high as 387 systolic as well as persistent dizziness. Pt had medication adjustments made by cardiology and subsequently by pharmacy. BP at PAT 132/72.  Repeat carotid ultrasound on 02/09/2019 revealed velocities in the right ICA  consistent with 80 to 56% stenosis, LICA are consistent with 40% to 59% stenosis. CTA neck showed near complete occlusion of the proximal right ICA versus less likely complete occlusion with immediate reconstitution. Dr. Trula Slade recommended TCAR for treatment of symptomatic R ICA stensois. Pt will remain on ASA, statin, plavix per protocol.   Hx of CKD with baseline creatinine 1.2-1.3. Creatinine on preop labs 1.35.  TTE 01/28/19:  1. A 26 CoreValve-Evolut Pro bioprosthetic aortic valve (TAVR) valve is present in the aortic position. Procedure Date: 01/2017 Normal aortic valve prosthesis.  2. 26 mm evolute corevalve present in aortic position. Vmax 2.6 m/s. Peak/mean gradients 27/14 mmHG. EOA 1.47 cm2. Normal functioning prosthetic valve with stable measurements compared with prior (03/06/2017). No regurgitation or paravalvular leak  detected.  3. The left ventricle has mildly reduced systolic function, with an ejection fraction of 45-50%. The cavity  size was normal. indeterminate diastolic function due to MAC. There is abnormal septal motion consistent with left bundle branch block.  4. Left atrial size was moderately dilated.  5. The mitral valve is degenerative. Mild thickening of the mitral valve leaflet. Mild calcification of  the mitral valve leaflet. There is moderate mitral annular calcification present.  6. The tricuspid valve is grossly normal.  7. The aorta is normal unless otherwise noted.  8. The aortic root is normal in size and structure.  9. The right ventricle has normal systolic function. The cavity was moderately enlarged. There is no increase in right ventricular wall thickness. 10. Right atrial size was moderately dilated. 11. When compared to the prior study: Compared with prior (11/06/17), gradients across TAVR are slightly increased but within normal limits for this valve. )       Anesthesia Quick Evaluation

## 2019-03-12 NOTE — Progress Notes (Signed)
Ms Biber denies chest pain or shortness of breath. Patient tested negative  for Covid and has been in quarantine since that time. Ms Myer is a type II diabetic - diet controlled. Patient  reports last A1C was drawn less that 6 weeks ago at Dr Jacquelynn Cree office and it was 5.0.  Patient no longer has a CBG monitor.  I will request records form Dr Raul Del office.  I asked anesthesia PA- C to review.

## 2019-03-12 NOTE — Progress Notes (Signed)
Anesthesia Chart Review: Follows with cardiology for hx of CAD s/p PCI with stents to the LAD and RCA in August 2018, aortic stenosis s/p TAVR in September 5170,  diastolic CHF, carotid disease. She was seen by Dr. Sallyanne Kuster on 01/29/2019 with complaints of severe dizziness. She was recommended for surgery due to vascular duplex ultrasound in January 2019 revealing 80% to 90% R ICA stenosis.  She refused at that time. Repeat TTE revealed normal LV revealed normal function of the aortic valve prosthesis, left ventricular systolic function was borderline depressed with an EF of 45% to 50% primarily due to left bundle branch block related dyssynchrony. She was seen by Jory Sims, NP on 9/21 with c/o blood pressures running as high as 017 systolic as well as persistent dizziness. Pt had medication adjustments made by cardiology and subsequently by pharmacy. BP at PAT 132/72.  Repeat carotid ultrasound on 02/09/2019 revealed velocities in the right ICA  consistent with 80 to 49% stenosis, LICA are consistent with 40% to 59% stenosis. CTA neck showed near complete occlusion of the proximal right ICA versus less likely complete occlusion with immediate reconstitution. Dr. Trula Slade recommended TCAR for treatment of symptomatic R ICA stensois. Pt will remain on ASA, statin, plavix per protocol.   Hx of CKD with baseline creatinine 1.2-1.3. Creatinine on preop labs 1.35.  TTE 01/28/19:  1. A 26 CoreValve-Evolut Pro bioprosthetic aortic valve (TAVR) valve is present in the aortic position. Procedure Date: 01/2017 Normal aortic valve prosthesis.  2. 26 mm evolute corevalve present in aortic position. Vmax 2.6 m/s. Peak/mean gradients 27/14 mmHG. EOA 1.47 cm2. Normal functioning prosthetic valve with stable measurements compared with prior (03/06/2017). No regurgitation or paravalvular leak  detected.  3. The left ventricle has mildly reduced systolic function, with an ejection fraction of 45-50%. The cavity size was  normal. indeterminate diastolic function due to MAC. There is abnormal septal motion consistent with left bundle branch block.  4. Left atrial size was moderately dilated.  5. The mitral valve is degenerative. Mild thickening of the mitral valve leaflet. Mild calcification of the mitral valve leaflet. There is moderate mitral annular calcification present.  6. The tricuspid valve is grossly normal.  7. The aorta is normal unless otherwise noted.  8. The aortic root is normal in size and structure.  9. The right ventricle has normal systolic function. The cavity was moderately enlarged. There is no increase in right ventricular wall thickness. 10. Right atrial size was moderately dilated. 11. When compared to the prior study: Compared with prior (11/06/17), gradients across TAVR are slightly increased but within normal limits for this valve.    [name] [facility] Short Stay Center/Anesthesiology Phone [phone] 03/12/2019 8:22 AM

## 2019-03-13 ENCOUNTER — Encounter (HOSPITAL_COMMUNITY): Payer: Self-pay

## 2019-03-13 ENCOUNTER — Inpatient Hospital Stay (HOSPITAL_COMMUNITY)
Admission: RE | Admit: 2019-03-13 | Discharge: 2019-03-16 | DRG: 035 | Disposition: A | Discharge status: Home / Self Care | Payer: Medicare Other | Attending: Surgery | Admitting: Surgery

## 2019-03-13 ENCOUNTER — Inpatient Hospital Stay (HOSPITAL_COMMUNITY): Payer: Medicare Other | Admitting: Physician Assistant

## 2019-03-13 ENCOUNTER — Encounter (HOSPITAL_COMMUNITY)
Admission: RE | Disposition: A | Discharge status: Home / Self Care | Payer: Self-pay | Source: Home / Self Care | Attending: Surgery

## 2019-03-13 ENCOUNTER — Other Ambulatory Visit: Payer: Self-pay

## 2019-03-13 ENCOUNTER — Inpatient Hospital Stay (HOSPITAL_COMMUNITY): Payer: Medicare Other | Admitting: Certified Registered Nurse Anesthetist

## 2019-03-13 DIAGNOSIS — Z955 Presence of coronary angioplasty implant and graft: Secondary | ICD-10-CM | POA: Diagnosis not present

## 2019-03-13 DIAGNOSIS — N179 Acute kidney failure, unspecified: Secondary | ICD-10-CM | POA: Diagnosis not present

## 2019-03-13 DIAGNOSIS — M109 Gout, unspecified: Secondary | ICD-10-CM | POA: Diagnosis present

## 2019-03-13 DIAGNOSIS — I2510 Atherosclerotic heart disease of native coronary artery without angina pectoris: Secondary | ICD-10-CM | POA: Diagnosis present

## 2019-03-13 DIAGNOSIS — Z885 Allergy status to narcotic agent status: Secondary | ICD-10-CM

## 2019-03-13 DIAGNOSIS — I110 Hypertensive heart disease with heart failure: Secondary | ICD-10-CM | POA: Diagnosis present

## 2019-03-13 DIAGNOSIS — Z7902 Long term (current) use of antithrombotics/antiplatelets: Secondary | ICD-10-CM

## 2019-03-13 DIAGNOSIS — Z20828 Contact with and (suspected) exposure to other viral communicable diseases: Secondary | ICD-10-CM | POA: Diagnosis present

## 2019-03-13 DIAGNOSIS — I6521 Occlusion and stenosis of right carotid artery: Secondary | ICD-10-CM | POA: Diagnosis present

## 2019-03-13 DIAGNOSIS — Z853 Personal history of malignant neoplasm of breast: Secondary | ICD-10-CM

## 2019-03-13 DIAGNOSIS — E1151 Type 2 diabetes mellitus with diabetic peripheral angiopathy without gangrene: Secondary | ICD-10-CM | POA: Diagnosis present

## 2019-03-13 DIAGNOSIS — Z85828 Personal history of other malignant neoplasm of skin: Secondary | ICD-10-CM

## 2019-03-13 DIAGNOSIS — E875 Hyperkalemia: Secondary | ICD-10-CM | POA: Diagnosis not present

## 2019-03-13 DIAGNOSIS — Z923 Personal history of irradiation: Secondary | ICD-10-CM

## 2019-03-13 DIAGNOSIS — D62 Acute posthemorrhagic anemia: Secondary | ICD-10-CM | POA: Diagnosis not present

## 2019-03-13 DIAGNOSIS — E785 Hyperlipidemia, unspecified: Secondary | ICD-10-CM | POA: Diagnosis present

## 2019-03-13 DIAGNOSIS — Z6838 Body mass index (BMI) 38.0-38.9, adult: Secondary | ICD-10-CM

## 2019-03-13 DIAGNOSIS — Z833 Family history of diabetes mellitus: Secondary | ICD-10-CM

## 2019-03-13 DIAGNOSIS — Z7982 Long term (current) use of aspirin: Secondary | ICD-10-CM

## 2019-03-13 DIAGNOSIS — E6609 Other obesity due to excess calories: Secondary | ICD-10-CM | POA: Diagnosis present

## 2019-03-13 DIAGNOSIS — Z8249 Family history of ischemic heart disease and other diseases of the circulatory system: Secondary | ICD-10-CM

## 2019-03-13 DIAGNOSIS — E7800 Pure hypercholesterolemia, unspecified: Secondary | ICD-10-CM | POA: Diagnosis present

## 2019-03-13 DIAGNOSIS — Z953 Presence of xenogenic heart valve: Secondary | ICD-10-CM | POA: Diagnosis not present

## 2019-03-13 DIAGNOSIS — R339 Retention of urine, unspecified: Secondary | ICD-10-CM | POA: Diagnosis not present

## 2019-03-13 DIAGNOSIS — I6529 Occlusion and stenosis of unspecified carotid artery: Secondary | ICD-10-CM | POA: Diagnosis present

## 2019-03-13 DIAGNOSIS — I5032 Chronic diastolic (congestive) heart failure: Secondary | ICD-10-CM | POA: Diagnosis present

## 2019-03-13 DIAGNOSIS — Z79899 Other long term (current) drug therapy: Secondary | ICD-10-CM

## 2019-03-13 HISTORY — PX: TRANSCAROTID ARTERY REVASCULARIZATIONÂ: SHX6778

## 2019-03-13 LAB — URINALYSIS, ROUTINE W REFLEX MICROSCOPIC
Bilirubin Urine: NEGATIVE
Glucose, UA: NEGATIVE mg/dL
Hgb urine dipstick: NEGATIVE
Ketones, ur: NEGATIVE mg/dL
Leukocytes,Ua: NEGATIVE
Nitrite: NEGATIVE
Protein, ur: 100 mg/dL — AB
Specific Gravity, Urine: 1.011 (ref 1.005–1.030)
pH: 6 (ref 5.0–8.0)

## 2019-03-13 LAB — GLUCOSE, CAPILLARY
Glucose-Capillary: 107 mg/dL — ABNORMAL HIGH (ref 70–99)
Glucose-Capillary: 122 mg/dL — ABNORMAL HIGH (ref 70–99)
Glucose-Capillary: 161 mg/dL — ABNORMAL HIGH (ref 70–99)
Glucose-Capillary: 191 mg/dL — ABNORMAL HIGH (ref 70–99)

## 2019-03-13 LAB — TYPE AND SCREEN
ABO/RH(D): A POS
ABO/RH(D): A POS
Antibody Screen: NEGATIVE
Antibody Screen: NEGATIVE

## 2019-03-13 LAB — POCT ACTIVATED CLOTTING TIME: Activated Clotting Time: 257 seconds

## 2019-03-13 SURGERY — TRANSCAROTID ARTERY REVASCULARIZATION
Anesthesia: General | Site: Neck | Laterality: Right | Wound class: Clean

## 2019-03-13 SURGERY — TRANSCAROTID ARTERY REVASCULARIZATION (TCAR)
Anesthesia: Choice | Laterality: Right

## 2019-03-13 MED ORDER — ASPIRIN EC 81 MG PO TBEC: 81.0000 mg | DELAYED_RELEASE_TABLET | Freq: Every day | ORAL | Status: DC

## 2019-03-13 MED ORDER — SUGAMMADEX SODIUM 200 MG/2ML IV SOLN
INTRAVENOUS | Status: DC | PRN
  Administered 2019-03-13: 200 mg via INTRAVENOUS

## 2019-03-13 MED ORDER — HYDRALAZINE HCL 20 MG/ML IJ SOLN: 5.0000 mg | INTRAMUSCULAR | Status: DC | PRN

## 2019-03-13 MED ORDER — ROCURONIUM BROMIDE 10 MG/ML (PF) SYRINGE
PREFILLED_SYRINGE | INTRAVENOUS | Status: DC | PRN
  Administered 2019-03-13: 70 mg via INTRAVENOUS
  Administered 2019-03-13: 20 mg via INTRAVENOUS

## 2019-03-13 MED ORDER — ROSUVASTATIN CALCIUM 5 MG PO TABS
10.0000 mg | ORAL_TABLET | Freq: Every day | ORAL | Status: DC
  Administered 2019-03-13 – 2019-03-16 (×4): 10 mg via ORAL
  Filled 2019-03-13 (×4): qty 2

## 2019-03-13 MED ORDER — PROPOFOL 10 MG/ML IV BOLUS
INTRAVENOUS | Status: DC | PRN
  Administered 2019-03-13: 20 mg via INTRAVENOUS
  Administered 2019-03-13: 30 mg via INTRAVENOUS
  Administered 2019-03-13: 70 mg via INTRAVENOUS

## 2019-03-13 MED ORDER — CHLORHEXIDINE GLUCONATE 4 % EX LIQD: 60.0000 mL | Freq: Once | CUTANEOUS | Status: DC

## 2019-03-13 MED ORDER — ACETAMINOPHEN 10 MG/ML IV SOLN
1000.0000 mg | Freq: Once | INTRAVENOUS | Status: DC | PRN
  Administered 2019-03-13: 1000 mg via INTRAVENOUS

## 2019-03-13 MED ORDER — PANTOPRAZOLE SODIUM 40 MG PO TBEC
40.0000 mg | DELAYED_RELEASE_TABLET | Freq: Every day | ORAL | Status: DC
  Administered 2019-03-14 – 2019-03-16 (×3): 40 mg via ORAL
  Filled 2019-03-13 (×3): qty 1

## 2019-03-13 MED ORDER — VITAMIN C 500 MG PO TABS
1000.0000 mg | ORAL_TABLET | Freq: Every day | ORAL | Status: DC
  Administered 2019-03-14 – 2019-03-16 (×3): 1000 mg via ORAL
  Filled 2019-03-13 (×4): qty 2

## 2019-03-13 MED ORDER — ACETAMINOPHEN 500 MG PO TABS: 1000.0000 mg | ORAL_TABLET | Freq: Once | ORAL | Status: DC | PRN

## 2019-03-13 MED ORDER — CEFAZOLIN SODIUM-DEXTROSE 2-4 GM/100ML-% IV SOLN
2.0000 g | Freq: Three times a day (TID) | INTRAVENOUS | Status: AC
  Administered 2019-03-13 – 2019-03-14 (×2): 2 g via INTRAVENOUS
  Filled 2019-03-13 (×2): qty 100

## 2019-03-13 MED ORDER — SODIUM CHLORIDE 0.9 % IV SOLN: INTRAVENOUS | Status: DC

## 2019-03-13 MED ORDER — IODIXANOL 320 MG/ML IV SOLN
INTRAVENOUS | Status: DC | PRN
  Administered 2019-03-13: 30 mL via INTRA_ARTERIAL

## 2019-03-13 MED ORDER — ACETAMINOPHEN 160 MG/5ML PO SOLN: 1000.0000 mg | Freq: Once | ORAL | Status: DC | PRN

## 2019-03-13 MED ORDER — HEMOSTATIC AGENTS (NO CHARGE) OPTIME
TOPICAL | Status: DC | PRN
  Administered 2019-03-13: 1 via TOPICAL

## 2019-03-13 MED ORDER — DOCUSATE SODIUM 100 MG PO CAPS
100.0000 mg | ORAL_CAPSULE | Freq: Every day | ORAL | Status: DC
  Administered 2019-03-14 – 2019-03-16 (×3): 100 mg via ORAL
  Filled 2019-03-13 (×3): qty 1

## 2019-03-13 MED ORDER — POTASSIUM CHLORIDE CRYS ER 20 MEQ PO TBCR: 20.0000 meq | EXTENDED_RELEASE_TABLET | Freq: Every day | ORAL | Status: DC | PRN

## 2019-03-13 MED ORDER — FUROSEMIDE 40 MG PO TABS: 40.0000 mg | ORAL_TABLET | Freq: Every day | ORAL | Status: DC | PRN

## 2019-03-13 MED ORDER — PHENYLEPHRINE 40 MCG/ML (10ML) SYRINGE FOR IV PUSH (FOR BLOOD PRESSURE SUPPORT)
PREFILLED_SYRINGE | INTRAVENOUS | Status: DC | PRN
  Administered 2019-03-13: 80 ug via INTRAVENOUS

## 2019-03-13 MED ORDER — LACTATED RINGERS IV SOLN
INTRAVENOUS | Status: DC | PRN
  Administered 2019-03-13: 08:00:00 via INTRAVENOUS

## 2019-03-13 MED ORDER — ACETAMINOPHEN 325 MG PO TABS
325.0000 mg | ORAL_TABLET | ORAL | Status: DC | PRN
  Administered 2019-03-14 – 2019-03-16 (×3): 650 mg via ORAL
  Filled 2019-03-13 (×3): qty 2

## 2019-03-13 MED ORDER — EPHEDRINE SULFATE-NACL 50-0.9 MG/10ML-% IV SOSY
PREFILLED_SYRINGE | INTRAVENOUS | Status: DC | PRN
  Administered 2019-03-13: 10 mg via INTRAVENOUS
  Administered 2019-03-13 (×2): 5 mg via INTRAVENOUS
  Administered 2019-03-13: 10 mg via INTRAVENOUS

## 2019-03-13 MED ORDER — PROTAMINE SULFATE 10 MG/ML IV SOLN
INTRAVENOUS | Status: DC | PRN
  Administered 2019-03-13: 50 mg via INTRAVENOUS

## 2019-03-13 MED ORDER — VITAMIN D 25 MCG (1000 UNIT) PO TABS
2000.0000 [IU] | ORAL_TABLET | Freq: Every day | ORAL | Status: DC
  Administered 2019-03-14 – 2019-03-16 (×3): 2000 [IU] via ORAL
  Filled 2019-03-13 (×4): qty 2

## 2019-03-13 MED ORDER — CEFAZOLIN SODIUM-DEXTROSE 2-4 GM/100ML-% IV SOLN
2.0000 g | INTRAVENOUS | Status: AC
  Administered 2019-03-13: 2 g via INTRAVENOUS
  Filled 2019-03-13: qty 100

## 2019-03-13 MED ORDER — SODIUM CHLORIDE 0.9 % IV SOLN
INTRAVENOUS | Status: DC | PRN
  Administered 2019-03-13: 500 mL

## 2019-03-13 MED ORDER — MORPHINE SULFATE (PF) 2 MG/ML IV SOLN
2.0000 mg | INTRAVENOUS | Status: DC | PRN
  Administered 2019-03-13: 2 mg via INTRAVENOUS
  Administered 2019-03-13: 4 mg via INTRAVENOUS
  Administered 2019-03-14: 2 mg via INTRAVENOUS
  Administered 2019-03-14: 4 mg via INTRAVENOUS
  Administered 2019-03-14: 2 mg via INTRAVENOUS
  Filled 2019-03-13 (×2): qty 2
  Filled 2019-03-13 (×3): qty 1

## 2019-03-13 MED ORDER — ALUM & MAG HYDROXIDE-SIMETH 200-200-20 MG/5ML PO SUSP: 15.0000 mL | ORAL | Status: DC | PRN

## 2019-03-13 MED ORDER — CLOPIDOGREL BISULFATE 75 MG PO TABS
75.0000 mg | ORAL_TABLET | Freq: Every day | ORAL | Status: DC
  Administered 2019-03-14 – 2019-03-16 (×3): 75 mg via ORAL
  Filled 2019-03-13 (×3): qty 1

## 2019-03-13 MED ORDER — IRBESARTAN 75 MG PO TABS
75.0000 mg | ORAL_TABLET | Freq: Every day | ORAL | Status: DC
  Administered 2019-03-14 – 2019-03-15 (×2): 75 mg via ORAL
  Filled 2019-03-13 (×2): qty 1

## 2019-03-13 MED ORDER — METOPROLOL TARTRATE 5 MG/5ML IV SOLN: 2.0000 mg | INTRAVENOUS | Status: DC | PRN

## 2019-03-13 MED ORDER — ONDANSETRON HCL 4 MG/2ML IJ SOLN
INTRAMUSCULAR | Status: DC | PRN
  Administered 2019-03-13: 4 mg via INTRAVENOUS

## 2019-03-13 MED ORDER — LIDOCAINE 2% (20 MG/ML) 5 ML SYRINGE
INTRAMUSCULAR | Status: DC | PRN
  Administered 2019-03-13: 60 mg via INTRAVENOUS

## 2019-03-13 MED ORDER — FENTANYL CITRATE (PF) 100 MCG/2ML IJ SOLN
INTRAMUSCULAR | Status: DC | PRN
  Administered 2019-03-13 (×2): 50 ug via INTRAVENOUS

## 2019-03-13 MED ORDER — GLYCOPYRROLATE PF 0.2 MG/ML IJ SOSY
PREFILLED_SYRINGE | INTRAMUSCULAR | Status: DC | PRN
  Administered 2019-03-13 (×3): .2 mg via INTRAVENOUS

## 2019-03-13 MED ORDER — LABETALOL HCL 5 MG/ML IV SOLN: 10.0000 mg | INTRAVENOUS | Status: DC | PRN

## 2019-03-13 MED ORDER — VITAMIN B 12 100 MCG PO LOZG: LOZENGE | Freq: Every day | ORAL | Status: DC

## 2019-03-13 MED ORDER — CLEVIDIPINE BUTYRATE 0.5 MG/ML IV EMUL
INTRAVENOUS | Status: AC
  Filled 2019-03-13: qty 50

## 2019-03-13 MED ORDER — GUAIFENESIN-DM 100-10 MG/5ML PO SYRP: 15.0000 mL | ORAL_SOLUTION | ORAL | Status: DC | PRN

## 2019-03-13 MED ORDER — SODIUM CHLORIDE 0.9 % IV SOLN
INTRAVENOUS | Status: DC | PRN
  Administered 2019-03-13: 25 ug/min via INTRAVENOUS

## 2019-03-13 MED ORDER — ASPIRIN EC 325 MG PO TBEC
325.0000 mg | DELAYED_RELEASE_TABLET | Freq: Every day | ORAL | Status: DC
  Administered 2019-03-13 – 2019-03-15 (×3): 325 mg via ORAL
  Filled 2019-03-13 (×3): qty 1

## 2019-03-13 MED ORDER — FENTANYL CITRATE (PF) 100 MCG/2ML IJ SOLN
25.0000 ug | INTRAMUSCULAR | Status: DC | PRN
  Administered 2019-03-13 (×5): 25 ug via INTRAVENOUS

## 2019-03-13 MED ORDER — CEFAZOLIN SODIUM-DEXTROSE 2-4 GM/100ML-% IV SOLN
2.0000 g | INTRAVENOUS | Status: DC
  Filled 2019-03-13: qty 100

## 2019-03-13 MED ORDER — PHENOL 1.4 % MT LIQD: 1.0000 | OROMUCOSAL | Status: DC | PRN

## 2019-03-13 MED ORDER — FENTANYL CITRATE (PF) 100 MCG/2ML IJ SOLN
INTRAMUSCULAR | Status: AC
  Filled 2019-03-13: qty 2

## 2019-03-13 MED ORDER — ONDANSETRON HCL 4 MG/2ML IJ SOLN: 4.0000 mg | Freq: Four times a day (QID) | INTRAMUSCULAR | Status: DC | PRN

## 2019-03-13 MED ORDER — AMLODIPINE BESYLATE 5 MG PO TABS
5.0000 mg | ORAL_TABLET | Freq: Every day | ORAL | Status: DC
  Administered 2019-03-14 – 2019-03-16 (×3): 5 mg via ORAL
  Filled 2019-03-13 (×3): qty 1

## 2019-03-13 MED ORDER — ACETAMINOPHEN 10 MG/ML IV SOLN
INTRAVENOUS | Status: AC
  Filled 2019-03-13: qty 100

## 2019-03-13 MED ORDER — SODIUM CHLORIDE 0.9 % IV SOLN
INTRAVENOUS | Status: DC
  Administered 2019-03-13 – 2019-03-15 (×2): via INTRAVENOUS

## 2019-03-13 MED ORDER — ACETAMINOPHEN 325 MG RE SUPP: 325.0000 mg | RECTAL | Status: DC | PRN

## 2019-03-13 MED ORDER — HYDRALAZINE HCL 50 MG PO TABS
75.0000 mg | ORAL_TABLET | Freq: Three times a day (TID) | ORAL | Status: DC
  Administered 2019-03-13 – 2019-03-16 (×10): 75 mg via ORAL
  Filled 2019-03-13 (×10): qty 1

## 2019-03-13 MED ORDER — HEPARIN SODIUM (PORCINE) 1000 UNIT/ML IJ SOLN
INTRAMUSCULAR | Status: DC | PRN
  Administered 2019-03-13: 10000 [IU] via INTRAVENOUS

## 2019-03-13 MED ORDER — 0.9 % SODIUM CHLORIDE (POUR BTL) OPTIME
TOPICAL | Status: DC | PRN
  Administered 2019-03-13: 1000 mL

## 2019-03-13 MED ORDER — CLEVIDIPINE BUTYRATE 0.5 MG/ML IV EMUL
INTRAVENOUS | Status: DC | PRN
  Administered 2019-03-13: 2 mg/h via INTRAVENOUS

## 2019-03-13 MED ORDER — HYDROCODONE-ACETAMINOPHEN 7.5-325 MG PO TABS: 1.0000 | ORAL_TABLET | Freq: Once | ORAL | Status: DC | PRN

## 2019-03-13 MED ORDER — MAGNESIUM SULFATE 2 GM/50ML IV SOLN: 2.0000 g | Freq: Every day | INTRAVENOUS | Status: DC | PRN

## 2019-03-13 MED ORDER — SODIUM CHLORIDE 0.9 % IV SOLN: 500.0000 mL | Freq: Once | INTRAVENOUS | Status: DC | PRN

## 2019-03-13 SURGICAL SUPPLY — 18 items
ADH SKN CLS APL DERMABOND .7 (GAUZE/BANDAGES/DRESSINGS) ×1
BAG SNAP BAND KOVER 36X36 (MISCELLANEOUS) ×2
BALLN STERLING RX 5X30X80 (BALLOONS) ×2
BALLN STERLING RX 6X20X80 (BALLOONS) ×2
COVER DOME SNAP 22 D (MISCELLANEOUS) ×2
DERMABOND ADVANCED (GAUZE/BANDAGES/DRESSINGS) ×1
DERMABOND ADVANCED .7 DNX12 (GAUZE/BANDAGES/DRESSINGS) ×1
DRAPE INCISE IOBAN 66X45 STRL (DRAPES) ×2
GUIDEWIRE ENROUTE 0.014 (WIRE) ×2
INTRODUCER KIT GALT 7CM (INTRODUCER) ×2
KIT ENCORE 26 ADVANTAGE (KITS) ×2
KIT INTRODUCER GALT 7 (INTRODUCER) ×1
NDL PERC 18GX7CM (NEEDLE) IMPLANT
NEEDLE PERC 18GX7CM (NEEDLE) ×2
PACK CAROTID (CUSTOM PROCEDURE TRAY) ×2
STENT TRANSCAROTID SYS 10X40 (Permanent Stent) ×2 IMPLANT
TRANSCAROTID NEUROPROTECT SYS (MISCELLANEOUS) ×2
WIRE BENTSON .035X145CM (WIRE) ×2

## 2019-03-13 NOTE — Op Note (Signed)
Patient name: Carrie Monroe MRN: [id number] DOB: November 15, 1935 Sex: female  03/13/2019 Pre-operative Diagnosis: Symptomatic right carotid stenosis Post-operative diagnosis:  Same Surgeon:  [name] Assistants: Fortunato Curling Procedure:    #1: Transcervical right carotid artery stenting (TCAR)   #2: Ultrasound-guided access, right femoral vein   #3: Flow reversal cerebral protection Anesthesia: General Blood Loss: Minimal Specimens: None  Findings: 90% right internal carotid artery stenosis treated using a 10 x 40 stent with no residual stenosis Contrast volume: 30 cc Dose area: 10.6 Fluoroscopy: 4.2 minutes Procedure time: 50 minutes Predilation balloon: 5 x 30, post dilation balloon 6 x 20 Stent: 10 x 40  Indications: Patient has a history of TAVR with a groin complication.  She has had a known high-grade right carotid lesion which she has not wanted to have intervened on.  However recently she has been having episodes which were concerning for cerebral hypoperfusion and so she is consented to carotid revascularization  Procedure:  The patient was identified in the holding area and taken to [facility] CATH LAB 6  The patient was then placed supine on the table. general anesthesia was administered.  The patient was prepped and draped in the usual sterile fashion.  A time out was called and antibiotics were administered.  Ultrasound was used to evaluate the right common femoral vein which is widely patent and easily compressible.  It was cannulated with an 18-gauge needle and an 035 wire was inserted.  The TCAR sheath was inserted.  Next, ultrasound was used to locate the carotid artery in the neck.  A transverse supraclavicular incision was made between the 2 heads of the sternocleidomastoid.  Cautery was used about the subcutaneous tissue and the platysma.  The avascular plane was identified between the 2 heads of the sternocleidomastoid.  Identified the jugular vein and reflected  laterally.  I then isolated the common carotid artery at the base of the incision.  It was encircled with a vessel loop and an umbilical tape.  The vagus nerve was not visualized.  I then dissected off the anterior surface of the carotid artery distally.  A 50U stitch was placed within the adventitia.  The patient was fully heparinized.  The ACT was confirmed to be greater than 250.  The common carotid artery was then cannulated with a micropuncture needle and a 018 wire was advanced for 7 cm.  A micropuncture sheath was inserted for 3 cm and angiography was performed locating the carotid bifurcation.  This was marked on the screen and a carotid Amplatz wire was then inserted up to the bifurcation.  The TCAR sheath was then placed and sutured into position.  2 views were taken to make sure there was no dissection at the cannulation site.  We then selected a RAO 20 degree position for a working position.  The flow reversal tubing was connected.  Flow reversal was confirmed.  A TCAR timeout was performed.  The heart rate was greater than 70 and the blood pressure was greater than 140.  A 5 x 30 balloon was inserted with a 018 wire.  The carotid artery was then occluded by tightening the vessel loop.  I confirmed that active flow reversal was functioning.  Using the wire with the support of the balloon, the wire was advanced across the lesion and placed into the distal internal carotid artery.  The balloon was then positioned across the lesion and taken to 10 atm.  There was no hemodynamic response.  We  then selected a 10 x 40 carotid stent and deployed this across the lesion.  We waited 2 minutes and performed additional angiography.  I felt that there was still a residual 50% lesion and so we inserted a 6 x 20 balloon and molded the stent taking the balloon to 12 atm.  Follow-up imaging showed resolution of the stenosis.  The wire was then removed.  Active flow reversal was discontinued.  The blood from the tubing  was returned to the patient.  The sheath was removed and the arteriotomy site closed by securing the previously placed U stitch suture.  50 mg of protamine was administered.  The sheath in the right groin was removed and manual pressure was held for hemostasis.  Hemostasis was then achieved within the neck incision.  The platysma was closed with 3-0 Vicryl and the skin was closed with 3-0 Vicryl followed by Dermabond.  The patient was successfully extubated, found to be neurologically intact and was taken to recovery in stable condition.   Disposition: To PACU stable.   Theotis Burrow, M.D., [facility] Office: [phone] Pager:  [phone]

## 2019-03-13 NOTE — Progress Notes (Signed)
Pt arrived to 4e. Pt oriented to room and staff. Vitals obtained and stable. CHG bath completed. Neuro assessment completed. NIH scale assessment completed. Pt denies needs at this time. Will monitor frequently.

## 2019-03-13 NOTE — Progress Notes (Signed)
S/p right TCAR Neuro intact Incision ok  Plan d/c tomorrow  Carrie Monroe

## 2019-03-13 NOTE — Interval H&P Note (Signed)
History and Physical Interval Note:  03/13/2019 7:07 AM  Carrie Monroe  has presented today for surgery, with the diagnosis of RIGHT CAROTID ARTERY STTENOSIS.  The various methods of treatment have been discussed with the patient and family. After consideration of risks, benefits and other options for treatment, the patient has consented to  Procedure(s): RIGHT TRANSCAROTID ARTERY REVASCULARIZATION (Right) as a surgical intervention.  [name] history has been reviewed, patient examined, no change in status, stable for surgery.  I have reviewed the patient's chart and labs.  Questions were answered to the patient's satisfaction.     [name]

## 2019-03-13 NOTE — Anesthesia Procedure Notes (Signed)
Procedure Name: Intubation Date/Time: 03/13/2019 8:40 AM Performed by: Colin Benton, CRNA Pre-anesthesia Checklist: Patient identified, Emergency Drugs available, Suction available and Patient being monitored Patient Re-evaluated:Patient Re-evaluated prior to induction Oxygen Delivery Method: Circle system utilized Preoxygenation: Pre-oxygenation with 100% oxygen Induction Type: IV induction Ventilation: Mask ventilation without difficulty and Oral airway inserted - appropriate to patient size Laryngoscope Size: Sabra Heck and 2 Grade View: Grade I Tube type: Oral Tube size: 7.0 mm Number of attempts: 1 Airway Equipment and Method: Stylet Placement Confirmation: ETT inserted through vocal cords under direct vision,  positive ETCO2 and breath sounds checked- equal and bilateral Secured at: 22 cm Tube secured with: Tape Dental Injury: Teeth and Oropharynx as per pre-operative assessment

## 2019-03-13 NOTE — Anesthesia Procedure Notes (Signed)
Arterial Line Insertion Start/End10/16/2020 8:01 AM, 03/13/2019 8:05 AM Performed by: Colin Benton, CRNA, CRNA  Patient location: Pre-op. Preanesthetic checklist: patient identified, IV checked, site marked, risks and benefits discussed, surgical consent, monitors and equipment checked, pre-op evaluation, timeout performed and anesthesia consent Lidocaine 1% used for infiltration Right, radial was placed Catheter size: 20 G Hand hygiene performed , maximum sterile barriers used  and Seldinger technique used Allen's test indicative of satisfactory collateral circulation Attempts: 1 Procedure performed without using ultrasound guided technique. Following insertion, dressing applied and Biopatch. Post procedure assessment: normal and unchanged  Patient tolerated the procedure well with no immediate complications.

## 2019-03-13 NOTE — Transfer of Care (Signed)
Immediate Anesthesia Transfer of Care Note  Patient: Carrie Monroe  Procedure(s) Performed: RIGHT TRANSCAROTID ARTERY REVASCULARIZATION (Right Neck)  Patient Location: PACU  Anesthesia Type:General  Level of Consciousness: drowsy and patient cooperative  Airway & Oxygen Therapy: Patient Spontanous Breathing and Patient connected to nasal cannula oxygen  Post-op Assessment: Report given to RN, Post -op Vital signs reviewed and stable, Patient moving all extremities X 4 and Patient able to stick tongue midline  Post vital signs: Reviewed and stable  Last Vitals:  Vitals Value Taken Time  BP 124/46 03/13/19 1028  Temp    Pulse 81 03/13/19 1030  Resp 17 03/13/19 1030  SpO2 96 % 03/13/19 1030  Vitals shown include unvalidated device data.  Last Pain:  Vitals:   03/13/19 0658  TempSrc: Oral  PainSc:          Complications: No apparent anesthesia complications

## 2019-03-14 LAB — GLUCOSE, CAPILLARY
Glucose-Capillary: 144 mg/dL — ABNORMAL HIGH (ref 70–99)
Glucose-Capillary: 116 mg/dL — ABNORMAL HIGH (ref 70–99)
Glucose-Capillary: 135 mg/dL — ABNORMAL HIGH (ref 70–99)

## 2019-03-14 LAB — CBC
HCT: 27.6 % — ABNORMAL LOW (ref 36.0–46.0)
Hemoglobin: 8.9 g/dL — ABNORMAL LOW (ref 12.0–15.0)
MCH: 32.1 pg (ref 26.0–34.0)
MCHC: 32.2 g/dL (ref 30.0–36.0)
MCV: 99.6 fL (ref 80.0–100.0)
Platelets: 111 10*3/uL — ABNORMAL LOW (ref 150–400)
RBC: 2.77 MIL/uL — ABNORMAL LOW (ref 3.87–5.11)
RDW: 13.3 % (ref 11.5–15.5)
WBC: 6.4 10*3/uL (ref 4.0–10.5)
nRBC: 0 % (ref 0.0–0.2)

## 2019-03-14 LAB — BASIC METABOLIC PANEL
Anion gap: 9 (ref 5–15)
BUN: 38 mg/dL — ABNORMAL HIGH (ref 8–23)
CO2: 20 mmol/L — ABNORMAL LOW (ref 22–32)
Calcium: 9 mg/dL (ref 8.9–10.3)
Chloride: 103 mmol/L (ref 98–111)
Creatinine, Ser: 1.56 mg/dL — ABNORMAL HIGH (ref 0.44–1.00)
GFR calc Af Amer: 36 mL/min — ABNORMAL LOW (ref >60)
GFR calc non Af Amer: 31 mL/min — ABNORMAL LOW (ref >60)
Glucose, Bld: 134 mg/dL — ABNORMAL HIGH (ref 70–99)
Potassium: 5.1 mmol/L (ref 3.5–5.1)
Sodium: 132 mmol/L — ABNORMAL LOW (ref 135–145)

## 2019-03-14 MED ORDER — HYDROCODONE-ACETAMINOPHEN 5-325 MG PO TABS: 1.0000 | ORAL_TABLET | Freq: Four times a day (QID) | ORAL | Status: DC | PRN

## 2019-03-14 NOTE — Plan of Care (Signed)

## 2019-03-14 NOTE — Progress Notes (Signed)
Carrie Monroe unable to void.  Bladder scan for 601 I & O cath for 1025 cc urine. Peri care given patient tolerated well. Maria NT assisted.

## 2019-03-14 NOTE — Progress Notes (Signed)
Patient complain about being dizzy, A-line left on because patient stated that the her BP 127/47 is too low for her.. the A-line BP os 171/44. We'll continue to monitor.

## 2019-03-14 NOTE — Progress Notes (Addendum)
Progress Note    03/14/2019 9:03 AM 1 Day Post-Op  Subjective:  Says she is feeling better.  Felt a little dizzy yesterday but felt it was better after she ate yesterday and breakfast this am.  Denies any trouble swallowing.  Has not been out of bed and has not voided.  Afebrile HR 50's-60's NSR 431'V-400'Q systolic 95 % [id number]   Vitals:   03/14/19 0615 03/14/19 0800  BP: (!) 127/47 (!) 125/44  Pulse: 60   Resp: 15 16  Temp: 98.5 F (36.9 C) 98.7 F (37.1 C)  SpO2: 94% 94%     Physical Exam: Neuro:  In tact; tongue is midline Lungs:  Non labored Incision:  Right neck incision is clean and dry; right groin is soft without hematoma.  CBC    Component Value Date/Time   WBC 6.4 03/14/2019 0710   RBC 2.77 (L) 03/14/2019 0710   HGB 8.9 (L) 03/14/2019 0710   HGB 10.0 (L) 06/11/2017 1623   HGB 11.2 (L) 02/23/2015 1029   HCT 27.6 (L) 03/14/2019 0710   HCT 31.4 (L) 06/11/2017 1623   HCT 33.6 (L) 02/23/2015 1029   PLT 111 (L) 03/14/2019 0710   PLT 281 06/11/2017 1623   MCV 99.6 03/14/2019 0710   MCV 96 06/11/2017 1623   MCV 95.1 02/23/2015 1029   MCH 32.1 03/14/2019 0710   MCHC 32.2 03/14/2019 0710   RDW 13.3 03/14/2019 0710   RDW 14.8 06/11/2017 1623   RDW 12.9 02/23/2015 1029   LYMPHSABS 0.9 11/18/2017 0858   LYMPHSABS 1.4 12/27/2016 0831   LYMPHSABS 1.3 02/23/2015 1029   MONOABS 0.3 11/18/2017 0858   MONOABS 0.3 02/23/2015 1029   EOSABS 0.1 11/18/2017 0858   EOSABS 0.2 12/27/2016 0831   BASOSABS 0.0 11/18/2017 0858   BASOSABS 0.0 12/27/2016 0831   BASOSABS 0.0 02/23/2015 1029    BMET    Component Value Date/Time   NA 132 (L) 03/14/2019 0710   NA 141 06/11/2017 1623   NA 141 02/23/2015 1029   K 5.1 03/14/2019 0710   K 4.3 02/23/2015 1029   CL 103 03/14/2019 0710   CL 111 (H) 08/28/2012 1224   CO2 20 (L) 03/14/2019 0710   CO2 26 02/23/2015 1029   GLUCOSE 134 (H) 03/14/2019 0710   GLUCOSE 193 (H) 02/23/2015 1029   GLUCOSE 103 (H) 08/28/2012 1224   BUN 38 (H) 03/14/2019 0710   BUN 24 06/11/2017 1623   BUN 20.2 02/23/2015 1029   CREATININE 1.56 (H) 03/14/2019 0710   CREATININE 1.3 (H) 02/23/2015 1029   CALCIUM 9.0 03/14/2019 0710   CALCIUM 9.5 02/23/2015 1029   GFRNONAA 31 (L) 03/14/2019 0710   GFRAA 36 (L) 03/14/2019 0710     Intake/Output Summary (Last 24 hours) at 03/14/2019 0903 Last data filed at 03/14/2019 0831 Gross per 24 hour  Intake 1863.35 ml  Output 550 ml  Net 1313.35 ml     Assessment/Plan:  This is a 83 y.o. female who is s/p right TCAR 1 Day Post-Op  -pt is doing well this am.  Pt's a-line needs to be discontinued.  creatinine up slightly to 1.56 from 1.35 and K+ is 5.1.  May need a gentle dose of lasix.  Pt on IVF at 50cc/hr. -acute surgical blood loss anemia with hg at 8.9 down from 11 but base line from last years labs.  No dizziness this am -pt neuro exam is in tact.  Moving all extremities equally and tongue is midline.  No difficulties  swallowing -pt has not ambulated-needs to bet OOB to chair.  States she has not walked much b/c she has had dizziness over the past months. -pt has not voided.  Does not have a foley.  May need bladder scan if unable to void when she gets to restroom.    Leontine Locket, PA-C [facility] [phone]   I agree with the above.  Patient is doing well status post transcarotid artery revascularization.  She is having some neck discomfort.  We will try her on Vicodin.  She is neurologically intact.  Her A-line will be removed this morning.  She will ambulate.  I will make sure that she is voided.  She will be reevaluated this afternoon for possible discharge.  [name]

## 2019-03-15 LAB — BASIC METABOLIC PANEL
Anion gap: 6 (ref 5–15)
Anion gap: 11 (ref 5–15)
BUN: 53 mg/dL — ABNORMAL HIGH (ref 8–23)
BUN: 57 mg/dL — ABNORMAL HIGH (ref 8–23)
CO2: 23 mmol/L (ref 22–32)
CO2: 19 mmol/L — ABNORMAL LOW (ref 22–32)
Calcium: 9.1 mg/dL (ref 8.9–10.3)
Calcium: 9.2 mg/dL (ref 8.9–10.3)
Chloride: 108 mmol/L (ref 98–111)
Chloride: 106 mmol/L (ref 98–111)
Creatinine, Ser: 2.03 mg/dL — ABNORMAL HIGH (ref 0.44–1.00)
Creatinine, Ser: 1.98 mg/dL — ABNORMAL HIGH (ref 0.44–1.00)
GFR calc Af Amer: 26 mL/min — ABNORMAL LOW (ref >60)
GFR calc Af Amer: 27 mL/min — ABNORMAL LOW (ref >60)
GFR calc non Af Amer: 22 mL/min — ABNORMAL LOW (ref >60)
GFR calc non Af Amer: 23 mL/min — ABNORMAL LOW (ref >60)
Glucose, Bld: 116 mg/dL — ABNORMAL HIGH (ref 70–99)
Glucose, Bld: 118 mg/dL — ABNORMAL HIGH (ref 70–99)
Potassium: 5.3 mmol/L — ABNORMAL HIGH (ref 3.5–5.1)
Potassium: 5.2 mmol/L — ABNORMAL HIGH (ref 3.5–5.1)
Sodium: 137 mmol/L (ref 135–145)
Sodium: 136 mmol/L (ref 135–145)

## 2019-03-15 LAB — GLUCOSE, CAPILLARY
Glucose-Capillary: 130 mg/dL — ABNORMAL HIGH (ref 70–99)
Glucose-Capillary: 131 mg/dL — ABNORMAL HIGH (ref 70–99)
Glucose-Capillary: 121 mg/dL — ABNORMAL HIGH (ref 70–99)
Glucose-Capillary: 112 mg/dL — ABNORMAL HIGH (ref 70–99)
Glucose-Capillary: 129 mg/dL — ABNORMAL HIGH (ref 70–99)

## 2019-03-15 MED ORDER — SODIUM POLYSTYRENE SULFONATE 15 GM/60ML PO SUSP
15.0000 g | Freq: Once | ORAL | Status: AC
  Administered 2019-03-15: 15 g via RECTAL
  Filled 2019-03-15: qty 60

## 2019-03-15 NOTE — Progress Notes (Signed)
#   14 fr foley catheter inserted after peri care completed.  Inserted without difficulty on initial attempt.  Jearld Lesch , NT assisted.  750 cc yellow colored urine returned.  Patient tolerated well.

## 2019-03-15 NOTE — Progress Notes (Signed)
Patient's creatinine has increased.  I discussed with the patient that we would keep her in the hospital another night until we get this to trend down.  Her ARB was discontinued today.  She continues to have difficulty voiding.  She is getting bladder scan and if it is greater than 400, we will place a Foley and leave it in overnight.  She did have hyperkalemia on her blood work this morning and so she was given Kayexalate.  Her labs will be repeated tomorrow.  [name]

## 2019-03-15 NOTE — Progress Notes (Signed)
Unsuccessful  attempt x 1 in the lower right arm to draw labs.  Lab notified of pending labs.  Patient tolerated well.

## 2019-03-15 NOTE — Progress Notes (Addendum)
Progress Note    03/15/2019 8:25 AM 2 Days Post-Op  Subjective:  Says she is still dizzy; denies any pain.  Afebrile  Vitals:   03/15/19 0410 03/15/19 0452  BP: (!) 114/53   Pulse:    Resp: 13 15  Temp: 97.9 F (36.6 C)   SpO2: 97% 94%     Physical Exam: Neuro:  In tact; moving all extremities equally  Lungs:  Non labored Incision:  Right neck incision is clean and dry  CBC    Component Value Date/Time   WBC 6.4 03/14/2019 0710   RBC 2.77 (L) 03/14/2019 0710   HGB 8.9 (L) 03/14/2019 0710   HGB 10.0 (L) 06/11/2017 1623   HGB 11.2 (L) 02/23/2015 1029   HCT 27.6 (L) 03/14/2019 0710   HCT 31.4 (L) 06/11/2017 1623   HCT 33.6 (L) 02/23/2015 1029   PLT 111 (L) 03/14/2019 0710   PLT 281 06/11/2017 1623   MCV 99.6 03/14/2019 0710   MCV 96 06/11/2017 1623   MCV 95.1 02/23/2015 1029   MCH 32.1 03/14/2019 0710   MCHC 32.2 03/14/2019 0710   RDW 13.3 03/14/2019 0710   RDW 14.8 06/11/2017 1623   RDW 12.9 02/23/2015 1029   LYMPHSABS 0.9 11/18/2017 0858   LYMPHSABS 1.4 12/27/2016 0831   LYMPHSABS 1.3 02/23/2015 1029   MONOABS 0.3 11/18/2017 0858   MONOABS 0.3 02/23/2015 1029   EOSABS 0.1 11/18/2017 0858   EOSABS 0.2 12/27/2016 0831   BASOSABS 0.0 11/18/2017 0858   BASOSABS 0.0 12/27/2016 0831   BASOSABS 0.0 02/23/2015 1029    BMET    Component Value Date/Time   NA 132 (L) 03/14/2019 0710   NA 141 06/11/2017 1623   NA 141 02/23/2015 1029   K 5.1 03/14/2019 0710   K 4.3 02/23/2015 1029   CL 103 03/14/2019 0710   CL 111 (H) 08/28/2012 1224   CO2 20 (L) 03/14/2019 0710   CO2 26 02/23/2015 1029   GLUCOSE 134 (H) 03/14/2019 0710   GLUCOSE 193 (H) 02/23/2015 1029   GLUCOSE 103 (H) 08/28/2012 1224   BUN 38 (H) 03/14/2019 0710   BUN 24 06/11/2017 1623   BUN 20.2 02/23/2015 1029   CREATININE 1.56 (H) 03/14/2019 0710   CREATININE 1.3 (H) 02/23/2015 1029   CALCIUM 9.0 03/14/2019 0710   CALCIUM 9.5 02/23/2015 1029   GFRNONAA 31 (L) 03/14/2019 0710   GFRAA 36 (L)  03/14/2019 0710     Intake/Output Summary (Last 24 hours) at 03/15/2019 0825 Last data filed at 03/15/2019 0300 Gross per 24 hour  Intake 1665.5 ml  Output 1275 ml  Net 390.5 ml     Assessment/Plan:  This is a 83 y.o. female who is s/p right TCAR 2 Days Post-Op  -pt is doing well this am. -pt neuro exam is in tact-she c/o still being dizzy.  Discussed that her PCP will continue the work up for her dizziness -pt has ambulated in the room -pt was originally able to void 250cc yesterday morning but did not void all day and bladder scan last evening revealed 1200cc and she was I&O cath.  She did not void overnight and bladder scan this am was <20cc.  Will check BMET to check renal function.      Leontine Locket, PA-C [facility] [phone] I agree with the above.  I have seen and evaluated the patient.  Hopefully she will be able to go home today.  She did have significant urinary retention yesterday.  She  will have to be able to void on her own.  She will also need to ambulate in the halls with therapy as well as the steps since she will have steps she will have to deal with at home.  [name]

## 2019-03-16 LAB — GLUCOSE, CAPILLARY: Glucose-Capillary: 113 mg/dL — ABNORMAL HIGH (ref 70–99)

## 2019-03-16 LAB — BASIC METABOLIC PANEL
Anion gap: 7 (ref 5–15)
BUN: 58 mg/dL — ABNORMAL HIGH (ref 8–23)
CO2: 19 mmol/L — ABNORMAL LOW (ref 22–32)
Calcium: 8.9 mg/dL (ref 8.9–10.3)
Chloride: 110 mmol/L (ref 98–111)
Creatinine, Ser: 1.81 mg/dL — ABNORMAL HIGH (ref 0.44–1.00)
GFR calc Af Amer: 30 mL/min — ABNORMAL LOW (ref >60)
GFR calc non Af Amer: 26 mL/min — ABNORMAL LOW (ref >60)
Glucose, Bld: 125 mg/dL — ABNORMAL HIGH (ref 70–99)
Potassium: 5.2 mmol/L — ABNORMAL HIGH (ref 3.5–5.1)
Sodium: 136 mmol/L (ref 135–145)

## 2019-03-16 MED ORDER — CHLORHEXIDINE GLUCONATE CLOTH 2 % EX PADS: 6.0000 | MEDICATED_PAD | Freq: Every day | CUTANEOUS | Status: DC

## 2019-03-16 MED ORDER — SODIUM POLYSTYRENE SULFONATE 15 GM/60ML PO SUSP
30.0000 g | Freq: Once | ORAL | Status: AC
  Administered 2019-03-16: 30 g via ORAL
  Filled 2019-03-16: qty 120

## 2019-03-16 MED ORDER — HYDROCODONE-ACETAMINOPHEN 5-325 MG PO TABS: 1.0000 | ORAL_TABLET | Freq: Four times a day (QID) | ORAL | 0 refills | Status: DC | PRN

## 2019-03-16 NOTE — Care Management Important Message (Signed)
Important Message  Patient Details  Name: Carrie Monroe MRN: [id number] Date of Birth: 1935-09-02   Medicare Important Message Given:  Yes     Shelda Altes 03/16/2019, 1:29 PM

## 2019-03-16 NOTE — Evaluation (Signed)
Physical Therapy Evaluation Patient Details Name: Carrie Monroe MRN: [id number] DOB: 17-Feb-1936 Today's Date: 03/16/2019   History of Present Illness  Pt is an 83 y/o female s/p R TCAR. Pt also with reports of dizziness throughout stay. PMH includes DM, HTN, s/p TAVR, L breast cancer s/p lumpectomy, CAD, and CKD.   Clinical Impression  Pt is s/p surgery above with deficits below. Pt requiring min guard A for mobility, however, pt notably fatigued following gait. Oxygen sats at 85% on RA following gait training, however, returned to 97% on RA with seated rest and pursed lip breathing. Educated about using RW at home to increase safety. Did not want to practice steps, however, reports children can assist with stair management if needed. Pt currently refusing HHPT. Will continue to follow acutely to maximize functional mobility independence and safety.     Follow Up Recommendations Supervision for mobility/OOB;No PT follow up(Pt refusing HHPT )    Equipment Recommendations  None recommended by PT    Recommendations for Other Services       Precautions / Restrictions Precautions Precautions: Fall;Other (comment) Precaution Comments: watch O2 sats Restrictions Weight Bearing Restrictions: No      Mobility  Bed Mobility               General bed mobility comments: In chair upon entry.   Transfers Overall transfer level: Needs assistance Equipment used: None Transfers: Sit to/from Stand Sit to Stand: Min guard         General transfer comment: Min guard for safety. No physical assist required. Pt asymptomatic upon standing.   Ambulation/Gait Ambulation/Gait assistance: Min guard Gait Distance (Feet): 110 Feet Assistive device: None Gait Pattern/deviations: Step-through pattern;Decreased stride length;Trunk flexed;Wide base of support Gait velocity: Decreased   General Gait Details: Slow, mildly unsteady gait, however, no overt LOB noted. Pt notably fatigued  following gait training. Checked oxygen sats and they were at 85% on RA. With seated rest and pursed lip breathing, sats returned to 97% on RA. Notified RN. No dizziness reported. Educated about using RW at home to improve safety with mobility.   Stairs Stairs: (pt not wanting to practice)          Wheelchair Mobility    Modified Rankin (Stroke Patients Only)       Balance Overall balance assessment: Needs assistance Sitting-balance support: No upper extremity supported;Feet supported Sitting balance-Leahy Scale: Good     Standing balance support: No upper extremity supported;During functional activity Standing balance-Leahy Scale: Fair                               Pertinent Vitals/Pain Pain Assessment: No/denies pain    Home Living Family/patient expects to be discharged to:: Private residence Living Arrangements: Alone Available Help at Discharge: Family;Available PRN/intermittently Type of Home: House Home Access: Stairs to enter Entrance Stairs-Rails: None Entrance Stairs-Number of Steps: 1-2 Home Layout: One level Home Equipment: Walker - 2 wheels Additional Comments: Reports she will be staying with her children at d/c.     Prior Function Level of Independence: Independent               Hand Dominance        Extremity/Trunk Assessment   Upper Extremity Assessment Upper Extremity Assessment: Overall WFL for tasks assessed    Lower Extremity Assessment Lower Extremity Assessment: Generalized weakness    Cervical / Trunk Assessment Cervical / Trunk Assessment: Kyphotic  Communication  Communication: No difficulties  Cognition Arousal/Alertness: Awake/alert Behavior During Therapy: WFL for tasks assessed/performed Overall Cognitive Status: Within Functional Limits for tasks assessed                                        General Comments      Exercises     Assessment/Plan    PT Assessment Patient needs  continued PT services  PT Problem List Decreased strength;Decreased balance;Decreased activity tolerance;Decreased mobility;Cardiopulmonary status limiting activity;Decreased knowledge of use of DME       PT Treatment Interventions Gait training;Stair training;Functional mobility training;DME instruction;Therapeutic exercise;Therapeutic activities;Balance training;Patient/family education    PT Goals (Current goals can be found in the Care Plan section)  Acute Rehab PT Goals Patient Stated Goal: to go home PT Goal Formulation: With patient Time For Goal Achievement: 03/30/19 Potential to Achieve Goals: Good    Frequency Min 3X/week   Barriers to discharge        Co-evaluation               AM-PAC PT "6 Clicks" Mobility  Outcome Measure Help needed turning from your back to your side while in a flat bed without using bedrails?: None Help needed moving from lying on your back to sitting on the side of a flat bed without using bedrails?: A Little Help needed moving to and from a bed to a chair (including a wheelchair)?: A Little Help needed standing up from a chair using your arms (e.g., wheelchair or bedside chair)?: A Little Help needed to walk in hospital room?: A Little Help needed climbing 3-5 steps with a railing? : A Lot 6 Click Score: 18    End of Session Equipment Utilized During Treatment: Gait belt Activity Tolerance: Patient limited by fatigue Patient left: in chair;with call bell/phone within reach Nurse Communication: Mobility status;Other (comment)(oxygen saturations. ) PT Visit Diagnosis: Unsteadiness on feet (R26.81);Other abnormalities of gait and mobility (R26.89);Muscle weakness (generalized) (M62.81)    Time: 2956-2130 PT Time Calculation (min) (ACUTE ONLY): 15 min   Charges:   PT Evaluation $PT Eval Moderate Complexity: [place], PT, DPT  Acute Rehabilitation Services  Pager: [phone] Office: [phone]   Rudean Hitt 03/16/2019, 12:03 PM

## 2019-03-16 NOTE — Progress Notes (Signed)
Post void residual 150. Patient voided 200cc.

## 2019-03-16 NOTE — Progress Notes (Signed)
Discharge instructions given to patient. Medications and wound care reviewed. All questions answered. IV removed, clean, dry and intact. Patient discharged home with daughter in law.   Arletta Bale, RN

## 2019-03-16 NOTE — Discharge Instructions (Signed)
   [facility]  Discharge Instructions   Carotid Endarterectomy (CEA)  Please refer to the following instructions for your post-procedure care. Your surgeon or physician assistant will discuss any changes with you.  Activity  You are encouraged to walk as much as you can. You can slowly return to normal activities but must avoid strenuous activity and heavy lifting until your doctor tell you it's OK. Avoid activities such as vacuuming or swinging a golf club. You can drive after one week if you are comfortable and you are no longer taking prescription pain medications. It is normal to feel tired for serval weeks after your surgery. It is also normal to have difficulty with sleep habits, eating, and bowel movements after surgery. These will go away with time.  Bathing/Showering  You may shower after you come home. Do not soak in a bathtub, hot tub, or swim until the incision heals completely.  Incision Care  Shower every day. Clean your incision with mild soap and water. Pat the area dry with a clean towel. You do not need a bandage unless otherwise instructed. Do not apply any ointments or creams to your incision. You may have skin glue on your incision. Do not peel it off. It will come off on its own in about one week. Your incision may feel thickened and raised for several weeks after your surgery. This is normal and the skin will soften over time. For Men Only: It's OK to shave around the incision but do not shave the incision itself for 2 weeks. It is common to have numbness under your chin that could last for several months.  Diet  Resume your normal diet. There are no special food restrictions following this procedure. A low fat/low cholesterol diet is recommended for all patients with vascular disease. In order to heal from your surgery, it is CRITICAL to get adequate nutrition. Your body requires vitamins, minerals, and protein. Vegetables are the best  source of vitamins and minerals. Vegetables also provide the perfect balance of protein. Processed food has little nutritional value, so try to avoid this.        Medications  Resume taking all of your medications unless your doctor or physician assistant tells you not to. If your incision is causing pain, you may take over-the- counter pain relievers such as acetaminophen (Tylenol). If you were prescribed a stronger pain medication, please be aware these medications can cause nausea and constipation. Prevent nausea by taking the medication with a snack or meal. Avoid constipation by drinking plenty of fluids and eating foods with a high amount of fiber, such as fruits, vegetables, and grains. Do not take Tylenol if you are taking prescription pain medications.  Follow Up  Our office will schedule a follow up appointment 2-3 weeks following discharge.  Please call us immediately for any of the following conditions  Increased pain, redness, drainage (pus) from your incision site. Fever of 101 degrees or higher. If you should develop stroke (slurred speech, difficulty swallowing, weakness on one side of your body, loss of vision) you should call 911 and go to the nearest emergency room.  Reduce your risk of vascular disease:  Stop smoking. If you would like help call [facility] at 1-800-QUIT-NOW (1-800-784-8669) or [employer] at 336-586-4000. Manage your cholesterol Maintain a desired weight Control your diabetes Keep your blood pressure down  If you have any questions, please call the office at [phone].   

## 2019-03-16 NOTE — Progress Notes (Signed)
Bladder scan showed 375ml urine present. Pt voided 256ml. Post void residual 11ml.

## 2019-03-16 NOTE — Progress Notes (Addendum)
  Progress Note    03/16/2019 4:47 PM 3 Days Post-Op  Subjective:  No stroke like symptoms.  Wants to go home.   Vitals:   03/16/19 1135 03/16/19 1150  BP: (!) 163/65 (!) 163/65  Pulse:    Resp: 18   Temp:    SpO2: 98%    Physical Exam: Lungs:  Non labored Incisions:  R neck incision c/d/i Extremities:  Moving all extremities well Neurologic: CN grossly intact  CBC    Component Value Date/Time   WBC 6.4 03/14/2019 0710   RBC 2.77 (L) 03/14/2019 0710   HGB 8.9 (L) 03/14/2019 0710   HGB 10.0 (L) 06/11/2017 1623   HGB 11.2 (L) 02/23/2015 1029   HCT 27.6 (L) 03/14/2019 0710   HCT 31.4 (L) 06/11/2017 1623   HCT 33.6 (L) 02/23/2015 1029   PLT 111 (L) 03/14/2019 0710   PLT 281 06/11/2017 1623   MCV 99.6 03/14/2019 0710   MCV 96 06/11/2017 1623   MCV 95.1 02/23/2015 1029   MCH 32.1 03/14/2019 0710   MCHC 32.2 03/14/2019 0710   RDW 13.3 03/14/2019 0710   RDW 14.8 06/11/2017 1623   RDW 12.9 02/23/2015 1029   LYMPHSABS 0.9 11/18/2017 0858   LYMPHSABS 1.4 12/27/2016 0831   LYMPHSABS 1.3 02/23/2015 1029   MONOABS 0.3 11/18/2017 0858   MONOABS 0.3 02/23/2015 1029   EOSABS 0.1 11/18/2017 0858   EOSABS 0.2 12/27/2016 0831   BASOSABS 0.0 11/18/2017 0858   BASOSABS 0.0 12/27/2016 0831   BASOSABS 0.0 02/23/2015 1029    BMET    Component Value Date/Time   NA 136 03/16/2019 0459   NA 141 06/11/2017 1623   NA 141 02/23/2015 1029   K 5.2 (H) 03/16/2019 0459   K 4.3 02/23/2015 1029   CL 110 03/16/2019 0459   CL 111 (H) 08/28/2012 1224   CO2 19 (L) 03/16/2019 0459   CO2 26 02/23/2015 1029   GLUCOSE 125 (H) 03/16/2019 0459   GLUCOSE 193 (H) 02/23/2015 1029   GLUCOSE 103 (H) 08/28/2012 1224   BUN 58 (H) 03/16/2019 0459   BUN 24 06/11/2017 1623   BUN 20.2 02/23/2015 1029   CREATININE 1.81 (H) 03/16/2019 0459   CREATININE 1.3 (H) 02/23/2015 1029   CALCIUM 8.9 03/16/2019 0459   CALCIUM 9.5 02/23/2015 1029   GFRNONAA 26 (L) 03/16/2019 0459   GFRAA 30 (L) 03/16/2019  0459    INR    Component Value Date/Time   INR 1.1 03/02/2019 1522     Intake/Output Summary (Last 24 hours) at 03/16/2019 1647 Last data filed at 03/16/2019 0537 Gross per 24 hour  Intake 240 ml  Output 1350 ml  Net -1110 ml     Assessment/Plan:  83 y.o. female is s/p R TCAR 3 Days Post-Op   Neuro exam at baseline Renal labs improving; hold ARB for another week if BP will allow Urinary retention; voided this morning. If able to void again without residual retention then patient will be discharged home tonight    Dagoberto Ligas, [place] [facility] [phone] 03/16/2019 4:47 PM

## 2019-03-17 NOTE — Discharge Summary (Signed)
Discharge Summary     Carrie Monroe [birth year] 83 y.o. female  [id number]  Admission Date: 03/13/2019  Discharge Date: 03/16/19  Physician: Dr. Trula Slade  Admission Diagnosis: RIGHT CAROTID ARTERY STTENOSIS  Discharge Day services:    see progress note 03/16/19 Physical Exam: Vitals:   03/16/19 1135 03/16/19 1150  BP: (!) 163/65 (!) 163/65  Pulse:    Resp: 18   Temp:    SpO2: 98%     Hospital Course:  The patient was admitted to the hospital and taken to the operating room on 03/13/2019 and underwent right TCAR.  The pt tolerated the procedure well and was transported to the PACU in good condition.   By POD 1, the pt neuro status remained at baseline  Patient did experience AKI which corrected with avoiding nephrotoxic agents and gentle IV hydration.  Patient also did not void over 24 hours postoperatively and developed urinary retention with likely bladder injury.  Before the time of discharge she was able to void x2 with no residual urine.  She will follow up in office in about 4 weeks with a carotid duplex.  She will continue her aspirin, Plavix, statin regimen daily.  Discharge instructions were reviewed with the patient and she voiced her understanding.  She was discharged home in stable condition.  It should also be noted that patient lives at home alone and refused home health physical therapy.  Patient believes she has frequent enough visitors to help her needed.  Patient also was instructed to hold her ARB for an additional week.   Recent Labs    03/15/19 1819 03/16/19 0459  NA 136 136  K 5.2* 5.2*  CL 106 110  CO2 19* 19*  GLUCOSE 118* 125*  BUN 57* 58*  CALCIUM 9.2 8.9   No results for input(s): WBC, HGB, HCT, PLT in the last 72 hours. No results for input(s): INR in the last 72 hours.     Discharge Diagnosis:  RIGHT CAROTID ARTERY STTENOSIS  Secondary Diagnosis: Patient Active Problem List   Diagnosis Date Noted  . Carotid stenosis  03/13/2019  . Encounter for medication monitoring 11/27/2017  . PICC (peripherally inserted central catheter) in place 11/27/2017  . MSSA bacteremia 11/05/2017  . Wound infection after surgery 11/04/2017  . Anemia 09/27/2017  . Hyperlipidemia   . History of kidney stones   . Hemorrhoids   . Exogenous obesity   . Chronic diastolic CHF (congestive heart failure) ([place])   . Arthritis   . Aortic stenosis, severe   . Acute respiratory failure with hypoxia ([place])   . Hemorrhagic shock ([place])   . 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 Carotid artery stenosis   . Coronary artery disease   . Chronic renal insufficiency, stage III (moderate) 12/22/2016  . Dental abscess- s/p multiple tooth extraction 12/21/16 12/22/2016  . Essential hypertension 01/27/2016  . Morbid obesity due to excess calories ([place]) 01/27/2016  . Gout 06/22/2013  . Dyslipidemia 05/08/2011  . Diabetes mellitus type 2 in obese ([place]) 05/08/2011  . Severe aortic stenosis 05/08/2011  . Breast cancer of upper-outer quadrant of left female breast ([place]) 03/16/2011   Past Medical History:  Diagnosis Date  . Aortic stenosis, severe    a. 01/2017: s/p TAVR; hospital course complicated by large groin hematoma/wound  . Arthritis    "fingers" (11/06/2017)  . Cancer of left breast ([place]) 2009   s/p lumpectomy and XRT  . Carotid artery stenosis    a. 11/2016: 80-99%  RICA stenosis, 1-39% vs low range 01-09% LICA   . Chronic diastolic CHF (congestive heart failure) ([place])   . CKD (chronic kidney disease)   . Coronary artery disease    a. 11/2016: diagnosed with multivessel CAD, turned down for CABG and underwent PCI/DES to mLCx, PCI/DES to mLAD and PCTA of ostial diagonal on 12/28/16  . Diabetes mellitus type 2, diet-controlled ([place])   . Exogenous obesity   . Gout    "on daily RX" (11/06/2017)  . Heart murmur   . Hemorrhoids   . History of blood transfusion 01/2017   "28 pints"  . History of kidney stones     passed  . Hyperlipidemia   . Hypertension   . 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 Squamous carcinoma 10/2017   "scalp"    Allergies as of 03/16/2019      Reactions   Oxycodone Other (See Comments)   Hallunication      Medication List    TAKE these medications   amLODipine 5 MG tablet Commonly known as: NORVASC Take 1 tablet (5 mg total) by mouth daily.   aspirin EC 81 MG tablet Take 81 mg by mouth at bedtime.   Black Elderberry 50 MG/5ML Syrp Take 5 mLs by mouth daily.   ciprofloxacin 500 MG tablet Commonly known as: Cipro Take 1 tablet (500 mg total) by mouth 2 (two) times daily.   clopidogrel 75 MG tablet Commonly known as: Plavix Take 1 tablet (75 mg total) by mouth daily.   colchicine 0.6 MG tablet Take 0.6 mg by mouth as needed.   COLLAGEN PO Take 5 g by mouth daily. power   furosemide 40 MG tablet Commonly known as: LASIX Take 1 tablet (40 mg total) by mouth daily as needed for fluid or edema.   hydrALAZINE 50 MG tablet Commonly known as: APRESOLINE Take 75 mg by mouth 3 (three) times daily.   HYDROcodone-acetaminophen 5-325 MG tablet Commonly known as: NORCO/VICODIN Take 1 tablet by mouth every 6 (six) hours as needed for moderate pain.   olmesartan 20 MG tablet Commonly known as: BENICAR Take 1 tablet (20 mg total) by mouth daily. Start taking on: March 23, 2019 What changed: These instructions start on March 23, 2019. If you are unsure what to do until then, ask your doctor or other care provider.   rosuvastatin 10 MG tablet Commonly known as: CRESTOR Take 10 mg by mouth daily.   VITAMIN B 12 PO Take 1 mL by mouth daily. liquid   vitamin C 1000 MG tablet Take 1,000 mg by mouth daily.   VITAMIN D PO Take 2,000 Units by mouth daily.   ZINC PO Take 3.75 mg by mouth daily.         Discharge Instructions:   [facility] Discharge Instructions Carotid Endarterectomy (CEA)  Please refer to the following instructions for your post-procedure care. Your surgeon or physician assistant will discuss any changes with you.  Activity  You are encouraged to walk as much as you can. You can slowly return to normal activities but must avoid strenuous activity and heavy lifting until your doctor tell you it's OK. Avoid activities such as vacuuming or swinging a golf club. You can drive after one week if you are comfortable and you are no longer taking prescription pain medications. It is normal to feel tired for  serval weeks after your surgery. It is also normal to have difficulty with sleep habits, eating, and bowel movements after surgery. These will go away with time.  Bathing/Showering  You may shower after you come home. Do not soak in a bathtub, hot tub, or swim until the incision heals completely.  Incision Care  Shower every day. Clean your incision with mild soap and water. Pat the area dry with a clean towel. You do not need a bandage unless otherwise instructed. Do not apply any ointments or creams to your incision. You may have skin glue on your incision. Do not peel it off. It will come off on its own in about one week. Your incision may feel thickened and raised for several weeks after your surgery. This is normal and the skin will soften over time. For Men Only: It's OK to shave around the incision but do not shave the incision itself for 2 weeks. It is common to have numbness under your chin that could last for several months.  Diet  Resume your normal diet. There are no special food restrictions following this procedure. A low fat/low cholesterol diet is recommended for all patients with vascular disease. In order to heal from your surgery, it is CRITICAL to get adequate nutrition. Your body requires vitamins, minerals, and protein.  Vegetables are the best source of vitamins and minerals. Vegetables also provide the perfect balance of protein. Processed food has little nutritional value, so try to avoid this.  Medications  Resume taking all of your medications unless your doctor or physician assistant tells you not to.  If your incision is causing pain, you may take over-the- counter pain relievers such as acetaminophen (Tylenol). If you were prescribed a stronger pain medication, please be aware these medications can cause nausea and constipation.  Prevent nausea by taking the medication with a snack or meal. Avoid constipation by drinking plenty of fluids and eating foods with a high amount of fiber, such as fruits, vegetables, and grains. Do not take Tylenol if you are taking prescription pain medications.  Follow Up  Our office will schedule a follow up appointment 2-3 weeks following discharge.  Please call us immediately for any of the following conditions  Increased pain, redness, drainage (pus) from your incision site. Fever of 101 degrees or higher. If you should develop stroke (slurred speech, difficulty swallowing, weakness on one side of your body, loss of vision) you should call 911 and go to the nearest emergency room.  Reduce your risk of vascular disease:  Stop smoking. If you would like help call [facility] at 1-800-QUIT-NOW 317-303-6854) or [place] at [phone]. Manage your cholesterol Maintain a desired weight Control your diabetes Keep your blood pressure down  If you have any questions, please call the office at [phone].  Disposition: home  Patient's condition: is Good  Follow up: 1. Dr. Trula Slade in 4 weeks.   Dagoberto Ligas, PA-C [facility] [phone]   --- For [facility] Registry use ---   Modified Rankin score at D/C (0-6): 0  IV medication needed for:  1. Hypertension: No 2. Hypotension: No  Post-op Complications: No  1. Post-op CVA or TIA: No   If yes: Event classification (right eye, left eye, right cortical, left cortical, verterobasilar, other):   If yes: Timing of event (intra-op, <6 hrs post-op, >=6 hrs post-op, unknown):   2. CN injury: No  If yes: CN  injuried   3. Myocardial infarction: No  If yes: Dx  by (EKG or clinical, Troponin):   4.  CHF: No  5.  Dysrhythmia (new): No  6. Wound infection: No  7. Reperfusion symptoms: No  8. Return to OR: No  If yes: return to OR for (bleeding, neurologic, other CEA incision, other):   Discharge medications: Statin use:  Yes ASA use:  Yes   Beta blocker use:  No ACE-Inhibitor use:  No  ARB use:  Yes CCB use: Yes P2Y12 Antagonist use: Yes, [ x] Plavix, [ ]  Plasugrel, [ ]  Ticlopinine, [ ]  Ticagrelor, [ ]  Other, [ ]  No for medical reason, [ ]  Non-compliant, [ ]  Not-indicated Anti-coagulant use:  No, [ ]  Warfarin, [ ]  Rivaroxaban, [ ]  Dabigatran,

## 2019-03-18 ENCOUNTER — Encounter (HOSPITAL_COMMUNITY): Payer: Self-pay | Admitting: Surgery

## 2019-03-19 NOTE — Anesthesia Postprocedure Evaluation (Signed)
Anesthesia Post Note  Patient: Carrie Monroe  Procedure(s) Performed: RIGHT TRANSCAROTID ARTERY REVASCULARIZATION (Right Neck)     Anesthesia Post Evaluation  Last Vitals:  Vitals:   03/16/19 1135 03/16/19 1150  BP: (!) 163/65 (!) 163/65  Pulse:    Resp: 18   Temp:    SpO2: 98%     Last Pain:  Vitals:   03/16/19 0930  TempSrc: Oral  PainSc:                  Doaa Kendzierski

## 2019-03-20 ENCOUNTER — Other Ambulatory Visit: Payer: Self-pay | Admitting: Cardiovascular Disease

## 2019-03-23 ENCOUNTER — Encounter (HOSPITAL_COMMUNITY): Payer: Self-pay | Admitting: Surgery

## 2019-04-08 ENCOUNTER — Other Ambulatory Visit: Payer: Self-pay

## 2019-04-08 DIAGNOSIS — I6521 Occlusion and stenosis of right carotid artery: Secondary | ICD-10-CM

## 2019-04-08 DIAGNOSIS — I6523 Occlusion and stenosis of bilateral carotid arteries: Secondary | ICD-10-CM

## 2019-04-09 ENCOUNTER — Ambulatory Visit (INDEPENDENT_AMBULATORY_CARE_PROVIDER_SITE_OTHER): Payer: Medicare Other | Admitting: Pharmacist

## 2019-04-09 ENCOUNTER — Other Ambulatory Visit: Payer: Self-pay

## 2019-04-09 VITALS — BP 142/58 | HR 79 | Resp 17 | Ht 63.0 in | Wt 220.0 lb

## 2019-04-09 DIAGNOSIS — I10 Essential (primary) hypertension: Secondary | ICD-10-CM

## 2019-04-09 MED ORDER — AMLODIPINE BESYLATE 5 MG PO TABS: 5.0000 mg | ORAL_TABLET | Freq: Every evening | ORAL | 1 refills | Status: DC

## 2019-04-09 NOTE — Patient Instructions (Addendum)
Return for a  follow up appointment in 4 weeks  *RECOMMENDATION      Neurologist evaluation for persistent dizziness   Check your blood pressure at home daily (if able) and keep record of the readings.  Take your BP meds as follows: *TAKE olmesartan 20mg  daily every morning* *Take amlodipine 5mg  in evening/bedtime *Take hydralazine 25mg  (1/2 tablet) at bedtime, may take up to three times per day for blood pressure above 160  *TAKE medication with food in stomach (after meal or smoothie)  Bring all of your meds, your BP cuff and your record of home blood pressures to your next appointment.  Exercise as you're able, try to walk approximately 30 minutes per day.  Keep salt intake to a minimum, especially watch canned and prepared boxed foods.  Eat more fresh fruits and vegetables and fewer canned items.  Avoid eating in fast food restaurants.    HOW TO TAKE YOUR BLOOD PRESSURE: . Rest 5 minutes before taking your blood pressure. .  Don't smoke or drink caffeinated beverages for at least 30 minutes before. . Take your blood pressure before (not after) you eat. . Sit comfortably with your back supported and both feet on the floor (don't cross your legs). . Elevate your arm to heart level on a table or a desk. . Use the proper sized cuff. It should fit smoothly and snugly around your bare upper arm. There should be enough room to slip a fingertip under the cuff. The bottom edge of the cuff should be 1 inch above the crease of the elbow. . Ideally, take 3 measurements at one sitting and record the average.

## 2019-04-09 NOTE — Progress Notes (Signed)
HPI:  Carrie Monroe is a 83 y.o. female patient of Dr Carrie Monroe, with a PMH below who presents today for hypertension clinic follow up.   Carrie Monroe had vascular surgery for right trans-carotid artery revascularization on 03/13/2019. Reports persistent dizziness limiting her independence. She is unable to drive or go anywhere by herself. Denies swelling, shortness or breath, headaches, blurry vision or falls.  Patient is due to start PT next week.  Past Medical History: CAD CPI with stents to LAD and RCA (12/2016), 80-99% R internal carotid stenosis  CKD SCr 1.3 on 9/2 ([facility]), down from 1.5 prior month.  CrCl 53.1 (28.6 with IBW)  Diastolic CHF EF 18-29%  hyperlipidemia TC 120, TG 71, HDL 35, LDL 71 (12/2018 [facility])  DM2 A1c 5.0 (12/2018) down from 6.3 in 2018  [facility]  obesity BMI 38      Blood Pressure Goal:  140/90 (pt unable to tolerate more aggressive BP control)  Current Medications: Amlodipine 5mg  daily - started 02/25/2019( every morning) Furosemide 40mg  daily as needed for edema olmesartan 20mg  daily - restarted 03/23/2019 (every morning)  hydralazine 75 mg tid   Family Hx: father died in his 35 on dialysis; mother was 32 pancreatic cancer  2 brothers deceased 1 died suddenly at 39 (? SCD?)  1 sister with diabetes  4 children, no known hypertension  Social Hx: no tobacco, no alcohol; no regular caffeine  Diet:  Eating Freshly - 6 meals per week; mostly chicken, occasionally sausage; with vegetables; rice/potatoes included as well; snacks consist of popcorn; drinks fruit smoothies with almond milk  Exercise:  Not currently due to dizziness  Home BP readings: no readings with her. Home wrist cuff is inaccurate with >79mmHg difference from manual reading. [place] arm cuff accurate.  Intolerances:  Previously took lisinopril 20 mg - not sure when or why this was discontinued   olmesartan made her feel like a zombie -   Labs:  01/2019:  Na 140, Glu 112, BUN 39, SCr 1.3.  Last K  level was from 10/2017 at 4.1   Wt Readings from Last 3 Encounters:  04/09/19 220 lb (99.8 kg)  03/13/19 220 lb (99.8 kg)  03/02/19 220 lb 3.2 oz (99.9 kg)   BP Readings from Last 3 Encounters:  04/09/19 (!) 142/58  03/16/19 (!) 163/65  03/04/19 (!) 175/62   Pulse Readings from Last 3 Encounters:  04/09/19 79  03/16/19 (!) 56  03/04/19 89    Current Outpatient Medications  Medication Sig Dispense Refill  . amLODipine (NORVASC) 5 MG tablet Take 1 tablet (5 mg total) by mouth every evening. 90 tablet 1  . Ascorbic Acid (VITAMIN C) 1000 MG tablet Take 1,000 mg by mouth daily.    Marland Kitchen aspirin EC 81 MG tablet Take 81 mg by mouth at bedtime.    . Cholecalciferol (VITAMIN D PO) Take 2,000 Units by mouth daily.     . clopidogrel (PLAVIX) 75 MG tablet Take 1 tablet (75 mg total) by mouth daily. 30 tablet 11  . colchicine 0.6 MG tablet Take 0.6 mg by mouth as needed.    . COLLAGEN PO Take 5 g by mouth daily. power     . Cyanocobalamin (VITAMIN B 12 PO) Take 1 mL by mouth daily. liquid    . furosemide (LASIX) 40 MG tablet TAKE 1 TABLET BY MOUTH DAILY AS NEEDED FOR FLUID OR EDEMA 90 tablet 1  . hydrALAZINE (APRESOLINE) 50 MG tablet Take 25 mg by mouth. Take 25mg  (  1/2 tablet) every evening, and up to 3 times daily if blood pressure above 160    . Multiple Vitamins-Minerals (ZINC PO) Take 3.75 mg by mouth daily.    Marland Kitchen olmesartan (BENICAR) 20 MG tablet Take 20 mg by mouth daily. Take very morning    . rosuvastatin (CRESTOR) 10 MG tablet Take 10 mg by mouth daily.   0  . Black Elderberry 50 MG/5ML SYRP Take 5 mLs by mouth daily.      No current facility-administered medications for this visit.     Allergies  Allergen Reactions  . Oxycodone Other (See Comments)    Hallunication    Past Medical History:  Diagnosis Date  . Aortic stenosis, severe    a. 01/2017: s/p TAVR; hospital course complicated by large groin hematoma/wound  . Arthritis    "fingers" (11/06/2017)  . Cancer of left breast  ([place]) 2009   s/p lumpectomy and XRT  . Carotid artery stenosis    a. 11/2016: 80-99% RICA stenosis, 1-39% vs low range 95-09% LICA   . Chronic diastolic CHF (congestive heart failure) ([place])   . CKD (chronic kidney disease)   . Coronary artery disease    a. 11/2016: diagnosed with multivessel CAD, turned down for CABG and underwent PCI/DES to mLCx, PCI/DES to mLAD and PCTA of ostial diagonal on 12/28/16  . Diabetes mellitus type 2, diet-controlled ([place])   . Exogenous obesity   . Gout    "on daily RX" (11/06/2017)  . Heart murmur   . Hemorrhoids   . History of blood transfusion 01/2017   "28 pints"  . History of kidney stones    passed  . Hyperlipidemia   . Hypertension   . 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 Squamous carcinoma 10/2017   "scalp"    Blood pressure (!) 142/58, pulse 79, resp. rate 17, height 5\' 3"  (1.6 m), weight 220 lb (99.8 kg), SpO2 91 %.  Essential hypertension Blood pressure remains slightly above desired goal of <140/90 but patient continues to report increased dizziness with lower BP. Will change amlodipine 5mg  to take at bedtime together with hydralazine 25mg , continue olmesartan every morning after breakfast, and Hydralazine 25mg  up to three times daily as needed for BP above 326 systolic. Patient is to monitor home BP with arm cuff only and brig records to f/u visit in 4 weeks.  Dizziness discussed with DR Croitoru. He agrees on neurologist evaluation as next best step. Will place referral for neurology consult.    Carrie Monroe PharmD, BCPS, [place] [address] 04/10/2019 4:33 PM

## 2019-04-10 ENCOUNTER — Encounter: Payer: Self-pay | Admitting: Pharmacist

## 2019-04-10 ENCOUNTER — Telehealth (HOSPITAL_COMMUNITY): Payer: Self-pay | Admitting: *Deleted

## 2019-04-10 NOTE — Assessment & Plan Note (Signed)
Blood pressure remains slightly above desired goal of <140/90 but patient continues to report increased dizziness with lower BP. Will change amlodipine 5mg  to take at bedtime together with hydralazine 25mg , continue olmesartan every morning after breakfast, and Hydralazine 25mg  up to three times daily as needed for BP above 209 systolic. Patient is to monitor home BP with arm cuff only and brig records to f/u visit in 4 weeks.  Dizziness discussed with DR Croitoru. He agrees on neurologist evaluation as next best step. Will place referral for neurology consult.

## 2019-04-10 NOTE — Telephone Encounter (Signed)

## 2019-04-13 ENCOUNTER — Ambulatory Visit (INDEPENDENT_AMBULATORY_CARE_PROVIDER_SITE_OTHER): Payer: Self-pay | Admitting: Surgery

## 2019-04-13 ENCOUNTER — Ambulatory Visit (HOSPITAL_COMMUNITY)
Admission: RE | Admit: 2019-04-13 | Discharge: 2019-04-13 | Disposition: A | Discharge status: Home / Self Care | Payer: Medicare Other | Source: Ambulatory Visit | Attending: Family | Admitting: Family

## 2019-04-13 ENCOUNTER — Other Ambulatory Visit: Payer: Self-pay

## 2019-04-13 ENCOUNTER — Encounter: Payer: Self-pay | Admitting: Surgery

## 2019-04-13 VITALS — BP 183/71 | HR 65 | Temp 98.0°F | Resp 20 | Ht 63.0 in | Wt 220.0 lb

## 2019-04-13 DIAGNOSIS — I6523 Occlusion and stenosis of bilateral carotid arteries: Secondary | ICD-10-CM | POA: Diagnosis not present

## 2019-04-13 DIAGNOSIS — I6521 Occlusion and stenosis of right carotid artery: Secondary | ICD-10-CM

## 2019-04-13 NOTE — Progress Notes (Signed)
Patient name: Carrie Monroe MRN: [id number] DOB: 1936/03/26 Sex: female  REASON FOR VISIT:     Post op TCAR  HISTORY OF PRESENT ILLNESS:   Carrie Monroe is a 83 y.o. female who underwent right sided TCAR on 03/13/2019.  This was done for a high-grade stenosis as well as dizziness.  Her procedure was uncomplicated.  She is still having issues with dizziness  CURRENT MEDICATIONS:    Current Outpatient Medications  Medication Sig Dispense Refill  . amLODipine (NORVASC) 5 MG tablet Take 1 tablet (5 mg total) by mouth every evening. 90 tablet 1  . Ascorbic Acid (VITAMIN C) 1000 MG tablet Take 1,000 mg by mouth daily.    Marland Kitchen aspirin EC 81 MG tablet Take 81 mg by mouth at bedtime.    . Black Elderberry 50 MG/5ML SYRP Take 5 mLs by mouth daily.     . Cholecalciferol (VITAMIN D PO) Take 2,000 Units by mouth daily.     . clopidogrel (PLAVIX) 75 MG tablet Take 1 tablet (75 mg total) by mouth daily. 30 tablet 11  . colchicine 0.6 MG tablet Take 0.6 mg by mouth as needed.    . COLLAGEN PO Take 5 g by mouth daily. power     . Cyanocobalamin (VITAMIN B 12 PO) Take 1 mL by mouth daily. liquid    . furosemide (LASIX) 40 MG tablet TAKE 1 TABLET BY MOUTH DAILY AS NEEDED FOR FLUID OR EDEMA 90 tablet 1  . hydrALAZINE (APRESOLINE) 50 MG tablet Take 25 mg by mouth. Take 25mg  (1/2 tablet) every evening, and up to 3 times daily if blood pressure above 160    . Multiple Vitamins-Minerals (ZINC PO) Take 3.75 mg by mouth daily.    Marland Kitchen olmesartan (BENICAR) 20 MG tablet Take 20 mg by mouth daily. Take very morning    . rosuvastatin (CRESTOR) 10 MG tablet Take 10 mg by mouth daily.   0   No current facility-administered medications for this visit.     REVIEW OF SYSTEMS:   [X]  denotes positive finding, [ ]  denotes negative finding Cardiac  Comments:  Chest pain or chest pressure:    Shortness of breath upon exertion:    Short of breath when lying flat:    Irregular  heart rhythm:    Constitutional    Fever or chills:      PHYSICAL EXAM:   Vitals:   04/13/19 1556 04/13/19 1600  BP: (!) 187/71 (!) 183/71  Pulse: 65   Resp: 20   Temp: 98 F (36.7 C)   SpO2: 97%   Weight: 220 lb (99.8 kg)   Height: 5\' 3"  (1.6 m)     GENERAL: The patient is a well-nourished female, in no acute distress. The vital signs are documented above. CARDIOVASCULAR: There is a regular rate and rhythm. PULMONARY: Non-labored respirations Incision is well-healed  STUDIES:   Right Carotid: Patent transcarotid revascularization with no visualized                stenosis.  Left Carotid: Velocities in the left ICA are consistent with a 40-59% stenosis.               Calcific plaque may obscure higher velocity.  Vertebrals:  Right vertebral artery demonstrates antegrade flow. Left vertebral              artery demonstrates high resistant flow, not well visualized. Subclavians: Normal flow hemodynamics were seen in bilateral subclavian  arteries.   MEDICAL ISSUES:   Carotid stenosis: Patient's right carotid is now widely patent.  I will have her follow-up in 6 months for repeat ultrasound  Dizziness: The patient remains very dizzy.  I will speak with Dr. Brigitte Pulse about the next steps.  She may benefit from neurology consultation.  I do not think this is related to her blood  [name], IV, MD, FACS [facility] [phone] Pager [phone]

## 2019-04-14 ENCOUNTER — Other Ambulatory Visit: Payer: Self-pay

## 2019-04-14 DIAGNOSIS — I6523 Occlusion and stenosis of bilateral carotid arteries: Secondary | ICD-10-CM

## 2019-04-21 ENCOUNTER — Other Ambulatory Visit: Payer: Self-pay | Admitting: Internal Medicine

## 2019-04-21 DIAGNOSIS — R42 Dizziness and giddiness: Secondary | ICD-10-CM

## 2019-04-27 ENCOUNTER — Inpatient Hospital Stay (HOSPITAL_COMMUNITY)
Admission: EM | Admit: 2019-04-27 | Discharge: 2019-04-29 | DRG: 864 | Disposition: A | Discharge status: Home Health | Payer: Medicare Other | Attending: Internal Medicine | Admitting: Internal Medicine

## 2019-04-27 ENCOUNTER — Encounter: Payer: Self-pay | Admitting: *Deleted

## 2019-04-27 ENCOUNTER — Emergency Department (HOSPITAL_COMMUNITY): Payer: Medicare Other

## 2019-04-27 DIAGNOSIS — D696 Thrombocytopenia, unspecified: Secondary | ICD-10-CM | POA: Diagnosis not present

## 2019-04-27 DIAGNOSIS — Z833 Family history of diabetes mellitus: Secondary | ICD-10-CM

## 2019-04-27 DIAGNOSIS — J9601 Acute respiratory failure with hypoxia: Secondary | ICD-10-CM | POA: Diagnosis present

## 2019-04-27 DIAGNOSIS — E669 Obesity, unspecified: Secondary | ICD-10-CM | POA: Diagnosis not present

## 2019-04-27 DIAGNOSIS — I10 Essential (primary) hypertension: Secondary | ICD-10-CM | POA: Diagnosis present

## 2019-04-27 DIAGNOSIS — R509 Fever, unspecified: Principal | ICD-10-CM | POA: Diagnosis present

## 2019-04-27 DIAGNOSIS — Z87442 Personal history of urinary calculi: Secondary | ICD-10-CM

## 2019-04-27 DIAGNOSIS — R6883 Chills (without fever): Secondary | ICD-10-CM

## 2019-04-27 DIAGNOSIS — Z953 Presence of xenogenic heart valve: Secondary | ICD-10-CM | POA: Diagnosis not present

## 2019-04-27 DIAGNOSIS — Z20822 Contact with and (suspected) exposure to covid-19: Secondary | ICD-10-CM

## 2019-04-27 DIAGNOSIS — M19049 Primary osteoarthritis, unspecified hand: Secondary | ICD-10-CM | POA: Diagnosis present

## 2019-04-27 DIAGNOSIS — E1169 Type 2 diabetes mellitus with other specified complication: Secondary | ICD-10-CM | POA: Diagnosis present

## 2019-04-27 DIAGNOSIS — D72829 Elevated white blood cell count, unspecified: Secondary | ICD-10-CM | POA: Diagnosis present

## 2019-04-27 DIAGNOSIS — J9611 Chronic respiratory failure with hypoxia: Secondary | ICD-10-CM | POA: Diagnosis present

## 2019-04-27 DIAGNOSIS — D631 Anemia in chronic kidney disease: Secondary | ICD-10-CM | POA: Diagnosis not present

## 2019-04-27 DIAGNOSIS — I5032 Chronic diastolic (congestive) heart failure: Secondary | ICD-10-CM | POA: Diagnosis not present

## 2019-04-27 DIAGNOSIS — Z20828 Contact with and (suspected) exposure to other viral communicable diseases: Secondary | ICD-10-CM | POA: Diagnosis present

## 2019-04-27 DIAGNOSIS — Z853 Personal history of malignant neoplasm of breast: Secondary | ICD-10-CM

## 2019-04-27 DIAGNOSIS — M109 Gout, unspecified: Secondary | ICD-10-CM | POA: Diagnosis present

## 2019-04-27 DIAGNOSIS — Z885 Allergy status to narcotic agent status: Secondary | ICD-10-CM | POA: Diagnosis not present

## 2019-04-27 DIAGNOSIS — E1122 Type 2 diabetes mellitus with diabetic chronic kidney disease: Secondary | ICD-10-CM | POA: Diagnosis not present

## 2019-04-27 DIAGNOSIS — N1832 Chronic kidney disease, stage 3b: Secondary | ICD-10-CM | POA: Diagnosis present

## 2019-04-27 DIAGNOSIS — Z923 Personal history of irradiation: Secondary | ICD-10-CM

## 2019-04-27 DIAGNOSIS — E785 Hyperlipidemia, unspecified: Secondary | ICD-10-CM | POA: Diagnosis present

## 2019-04-27 DIAGNOSIS — E872 Acidosis: Secondary | ICD-10-CM | POA: Diagnosis present

## 2019-04-27 DIAGNOSIS — Z955 Presence of coronary angioplasty implant and graft: Secondary | ICD-10-CM

## 2019-04-27 DIAGNOSIS — I130 Hypertensive heart and chronic kidney disease with heart failure and stage 1 through stage 4 chronic kidney disease, or unspecified chronic kidney disease: Secondary | ICD-10-CM | POA: Diagnosis present

## 2019-04-27 DIAGNOSIS — M6281 Muscle weakness (generalized): Secondary | ICD-10-CM | POA: Diagnosis present

## 2019-04-27 DIAGNOSIS — I2510 Atherosclerotic heart disease of native coronary artery without angina pectoris: Secondary | ICD-10-CM | POA: Diagnosis present

## 2019-04-27 DIAGNOSIS — R197 Diarrhea, unspecified: Secondary | ICD-10-CM | POA: Diagnosis not present

## 2019-04-27 DIAGNOSIS — Z952 Presence of prosthetic heart valve: Secondary | ICD-10-CM

## 2019-04-27 DIAGNOSIS — Z8249 Family history of ischemic heart disease and other diseases of the circulatory system: Secondary | ICD-10-CM

## 2019-04-27 DIAGNOSIS — R0902 Hypoxemia: Secondary | ICD-10-CM

## 2019-04-27 LAB — URINALYSIS, ROUTINE W REFLEX MICROSCOPIC
Bilirubin Urine: NEGATIVE
Glucose, UA: NEGATIVE mg/dL
Ketones, ur: NEGATIVE mg/dL
Leukocytes,Ua: NEGATIVE
Nitrite: NEGATIVE
Protein, ur: 100 mg/dL — AB
Specific Gravity, Urine: 1.014 (ref 1.005–1.030)
pH: 5 (ref 5.0–8.0)

## 2019-04-27 LAB — BASIC METABOLIC PANEL
Anion gap: 11 (ref 5–15)
BUN: 29 mg/dL — ABNORMAL HIGH (ref 8–23)
CO2: 19 mmol/L — ABNORMAL LOW (ref 22–32)
Calcium: 9.5 mg/dL (ref 8.9–10.3)
Chloride: 107 mmol/L (ref 98–111)
Creatinine, Ser: 1.45 mg/dL — ABNORMAL HIGH (ref 0.44–1.00)
GFR calc Af Amer: 39 mL/min — ABNORMAL LOW (ref >60)
GFR calc non Af Amer: 34 mL/min — ABNORMAL LOW (ref >60)
Glucose, Bld: 168 mg/dL — ABNORMAL HIGH (ref 70–99)
Potassium: 4.2 mmol/L (ref 3.5–5.1)
Sodium: 137 mmol/L (ref 135–145)

## 2019-04-27 LAB — TSH: TSH: 1.022 u[IU]/mL (ref 0.350–4.500)

## 2019-04-27 LAB — CBC WITH DIFFERENTIAL/PLATELET
Abs Immature Granulocytes: 0.07 10*3/uL (ref 0.00–0.07)
Basophils Absolute: 0 10*3/uL (ref 0.0–0.1)
Basophils Relative: 0 %
Eosinophils Absolute: 0 10*3/uL (ref 0.0–0.5)
Eosinophils Relative: 0 %
HCT: 31.4 % — ABNORMAL LOW (ref 36.0–46.0)
Hemoglobin: 10.2 g/dL — ABNORMAL LOW (ref 12.0–15.0)
Immature Granulocytes: 1 %
Lymphocytes Relative: 3 %
Lymphs Abs: 0.4 10*3/uL — ABNORMAL LOW (ref 0.7–4.0)
MCH: 32.4 pg (ref 26.0–34.0)
MCHC: 32.5 g/dL (ref 30.0–36.0)
MCV: 99.7 fL (ref 80.0–100.0)
Monocytes Absolute: 0.8 10*3/uL (ref 0.1–1.0)
Monocytes Relative: 6 %
Neutro Abs: 11.5 10*3/uL — ABNORMAL HIGH (ref 1.7–7.7)
Neutrophils Relative %: 90 %
Platelets: 106 10*3/uL — ABNORMAL LOW (ref 150–400)
RBC: 3.15 MIL/uL — ABNORMAL LOW (ref 3.87–5.11)
RDW: 13.7 % (ref 11.5–15.5)
WBC: 12.8 10*3/uL — ABNORMAL HIGH (ref 4.0–10.5)
nRBC: 0 % (ref 0.0–0.2)

## 2019-04-27 LAB — HEPATIC FUNCTION PANEL
ALT: 17 U/L (ref 0–44)
AST: 24 U/L (ref 15–41)
Albumin: 3.4 g/dL — ABNORMAL LOW (ref 3.5–5.0)
Alkaline Phosphatase: 96 U/L (ref 38–126)
Bilirubin, Direct: 0.2 mg/dL (ref 0.0–0.2)
Indirect Bilirubin: 0.9 mg/dL (ref 0.3–0.9)
Total Bilirubin: 1.1 mg/dL (ref 0.3–1.2)
Total Protein: 6.8 g/dL (ref 6.5–8.1)

## 2019-04-27 LAB — POC SARS CORONAVIRUS 2 AG -  ED: SARS Coronavirus 2 Ag: NEGATIVE

## 2019-04-27 LAB — LIPASE, BLOOD: Lipase: 25 U/L (ref 11–51)

## 2019-04-27 NOTE — ED Triage Notes (Signed)
EMS stated, fever, chills, malaise, since 1000

## 2019-04-27 NOTE — ED Triage Notes (Signed)
Given tylenol 1000 mg. po at 1530 brought temp down to 100.5 oral

## 2019-04-27 NOTE — ED Triage Notes (Signed)
20g in Rt. AC

## 2019-04-27 NOTE — ED Notes (Signed)
Patient Daughter [name] [phone]
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
## 2019-04-28 ENCOUNTER — Other Ambulatory Visit: Payer: Self-pay

## 2019-04-28 ENCOUNTER — Encounter (HOSPITAL_COMMUNITY): Payer: Self-pay | Admitting: Internal Medicine

## 2019-04-28 ENCOUNTER — Ambulatory Visit: Payer: Medicare Other | Admitting: Diagnostic Neuroimaging

## 2019-04-28 DIAGNOSIS — E1169 Type 2 diabetes mellitus with other specified complication: Secondary | ICD-10-CM

## 2019-04-28 DIAGNOSIS — D72829 Elevated white blood cell count, unspecified: Secondary | ICD-10-CM | POA: Diagnosis not present

## 2019-04-28 DIAGNOSIS — M6281 Muscle weakness (generalized): Secondary | ICD-10-CM | POA: Diagnosis not present

## 2019-04-28 DIAGNOSIS — E1122 Type 2 diabetes mellitus with diabetic chronic kidney disease: Secondary | ICD-10-CM | POA: Diagnosis not present

## 2019-04-28 DIAGNOSIS — R509 Fever, unspecified: Secondary | ICD-10-CM | POA: Diagnosis not present

## 2019-04-28 DIAGNOSIS — M109 Gout, unspecified: Secondary | ICD-10-CM | POA: Diagnosis not present

## 2019-04-28 DIAGNOSIS — Z923 Personal history of irradiation: Secondary | ICD-10-CM | POA: Diagnosis not present

## 2019-04-28 DIAGNOSIS — E872 Acidosis: Secondary | ICD-10-CM | POA: Diagnosis not present

## 2019-04-28 DIAGNOSIS — R6883 Chills (without fever): Secondary | ICD-10-CM | POA: Diagnosis present

## 2019-04-28 DIAGNOSIS — Z955 Presence of coronary angioplasty implant and graft: Secondary | ICD-10-CM | POA: Diagnosis not present

## 2019-04-28 DIAGNOSIS — Z853 Personal history of malignant neoplasm of breast: Secondary | ICD-10-CM | POA: Diagnosis not present

## 2019-04-28 DIAGNOSIS — Z953 Presence of xenogenic heart valve: Secondary | ICD-10-CM | POA: Diagnosis not present

## 2019-04-28 DIAGNOSIS — I2510 Atherosclerotic heart disease of native coronary artery without angina pectoris: Secondary | ICD-10-CM | POA: Diagnosis not present

## 2019-04-28 DIAGNOSIS — Z20828 Contact with and (suspected) exposure to other viral communicable diseases: Secondary | ICD-10-CM | POA: Diagnosis not present

## 2019-04-28 DIAGNOSIS — D696 Thrombocytopenia, unspecified: Secondary | ICD-10-CM | POA: Diagnosis not present

## 2019-04-28 DIAGNOSIS — J9601 Acute respiratory failure with hypoxia: Secondary | ICD-10-CM

## 2019-04-28 DIAGNOSIS — E669 Obesity, unspecified: Secondary | ICD-10-CM

## 2019-04-28 DIAGNOSIS — I130 Hypertensive heart and chronic kidney disease with heart failure and stage 1 through stage 4 chronic kidney disease, or unspecified chronic kidney disease: Secondary | ICD-10-CM | POA: Diagnosis not present

## 2019-04-28 DIAGNOSIS — Z87442 Personal history of urinary calculi: Secondary | ICD-10-CM | POA: Diagnosis not present

## 2019-04-28 DIAGNOSIS — I5032 Chronic diastolic (congestive) heart failure: Secondary | ICD-10-CM | POA: Diagnosis not present

## 2019-04-28 DIAGNOSIS — N1832 Chronic kidney disease, stage 3b: Secondary | ICD-10-CM | POA: Diagnosis not present

## 2019-04-28 DIAGNOSIS — D631 Anemia in chronic kidney disease: Secondary | ICD-10-CM | POA: Diagnosis not present

## 2019-04-28 DIAGNOSIS — R197 Diarrhea, unspecified: Secondary | ICD-10-CM | POA: Diagnosis not present

## 2019-04-28 DIAGNOSIS — E785 Hyperlipidemia, unspecified: Secondary | ICD-10-CM | POA: Diagnosis not present

## 2019-04-28 DIAGNOSIS — Z885 Allergy status to narcotic agent status: Secondary | ICD-10-CM | POA: Diagnosis not present

## 2019-04-28 DIAGNOSIS — M19049 Primary osteoarthritis, unspecified hand: Secondary | ICD-10-CM | POA: Diagnosis not present

## 2019-04-28 LAB — BRAIN NATRIURETIC PEPTIDE: B Natriuretic Peptide: 350 pg/mL — ABNORMAL HIGH (ref 0.0–100.0)

## 2019-04-28 LAB — RESPIRATORY PANEL BY PCR

## 2019-04-28 LAB — CBC
HCT: 30 % — ABNORMAL LOW (ref 36.0–46.0)
Hemoglobin: 9.7 g/dL — ABNORMAL LOW (ref 12.0–15.0)
MCH: 32.2 pg (ref 26.0–34.0)
MCHC: 32.3 g/dL (ref 30.0–36.0)
MCV: 99.7 fL (ref 80.0–100.0)
Platelets: 91 10*3/uL — ABNORMAL LOW (ref 150–400)
RBC: 3.01 MIL/uL — ABNORMAL LOW (ref 3.87–5.11)
RDW: 13.7 % (ref 11.5–15.5)
WBC: 9.3 10*3/uL (ref 4.0–10.5)
nRBC: 0 % (ref 0.0–0.2)

## 2019-04-28 LAB — URINE CULTURE: Culture: <10000 — AB

## 2019-04-28 LAB — CBG MONITORING, ED
Glucose-Capillary: 126 mg/dL — ABNORMAL HIGH (ref 70–99)
Glucose-Capillary: 109 mg/dL — ABNORMAL HIGH (ref 70–99)

## 2019-04-28 LAB — BASIC METABOLIC PANEL
Anion gap: 13 (ref 5–15)
BUN: 27 mg/dL — ABNORMAL HIGH (ref 8–23)
CO2: 18 mmol/L — ABNORMAL LOW (ref 22–32)
Calcium: 9.4 mg/dL (ref 8.9–10.3)
Chloride: 108 mmol/L (ref 98–111)
Creatinine, Ser: 1.33 mg/dL — ABNORMAL HIGH (ref 0.44–1.00)
GFR calc Af Amer: 43 mL/min — ABNORMAL LOW (ref >60)
GFR calc non Af Amer: 37 mL/min — ABNORMAL LOW (ref >60)
Glucose, Bld: 135 mg/dL — ABNORMAL HIGH (ref 70–99)
Potassium: 4.1 mmol/L (ref 3.5–5.1)
Sodium: 139 mmol/L (ref 135–145)

## 2019-04-28 LAB — PROCALCITONIN: Procalcitonin: 0.49 ng/mL

## 2019-04-28 LAB — GLUCOSE, CAPILLARY: Glucose-Capillary: 115 mg/dL — ABNORMAL HIGH (ref 70–99)

## 2019-04-28 LAB — SARS CORONAVIRUS 2 (TAT 6-24 HRS): SARS Coronavirus 2: NEGATIVE

## 2019-04-28 LAB — D-DIMER, QUANTITATIVE: D-Dimer, Quant: 0.73 ug/mL-FEU — ABNORMAL HIGH (ref 0.00–0.50)

## 2019-04-28 MED ORDER — IRBESARTAN 150 MG PO TABS
150.0000 mg | ORAL_TABLET | Freq: Every day | ORAL | Status: DC
  Filled 2019-04-28: qty 1

## 2019-04-28 MED ORDER — CLOPIDOGREL BISULFATE 75 MG PO TABS
75.0000 mg | ORAL_TABLET | Freq: Every day | ORAL | Status: DC
  Administered 2019-04-28 – 2019-04-29 (×2): 75 mg via ORAL
  Filled 2019-04-28 (×2): qty 1

## 2019-04-28 MED ORDER — ACETAMINOPHEN 325 MG PO TABS
650.0000 mg | ORAL_TABLET | Freq: Four times a day (QID) | ORAL | Status: DC | PRN
  Filled 2019-04-28: qty 2

## 2019-04-28 MED ORDER — ROSUVASTATIN CALCIUM 5 MG PO TABS
10.0000 mg | ORAL_TABLET | Freq: Every day | ORAL | Status: DC
  Administered 2019-04-28: 19:00:00 10 mg via ORAL
  Filled 2019-04-28 (×2): qty 2

## 2019-04-28 MED ORDER — FUROSEMIDE 20 MG PO TABS
40.0000 mg | ORAL_TABLET | Freq: Once | ORAL | Status: AC
  Administered 2019-04-28: 11:00:00 40 mg via ORAL
  Filled 2019-04-28: qty 2

## 2019-04-28 MED ORDER — ONDANSETRON HCL 4 MG PO TABS: 4.0000 mg | ORAL_TABLET | Freq: Four times a day (QID) | ORAL | Status: DC | PRN

## 2019-04-28 MED ORDER — ONDANSETRON HCL 4 MG/2ML IJ SOLN: 4.0000 mg | Freq: Four times a day (QID) | INTRAMUSCULAR | Status: DC | PRN

## 2019-04-28 MED ORDER — ENOXAPARIN SODIUM 40 MG/0.4ML ~~LOC~~ SOLN
40.0000 mg | SUBCUTANEOUS | Status: DC
  Administered 2019-04-28: 40 mg via SUBCUTANEOUS
  Filled 2019-04-28: qty 0.4

## 2019-04-28 MED ORDER — ASPIRIN EC 81 MG PO TBEC
81.0000 mg | DELAYED_RELEASE_TABLET | Freq: Every day | ORAL | Status: DC
  Administered 2019-04-28: 22:00:00 81 mg via ORAL
  Filled 2019-04-28: qty 1

## 2019-04-28 MED ORDER — ACETAMINOPHEN 650 MG RE SUPP: 650.0000 mg | Freq: Four times a day (QID) | RECTAL | Status: DC | PRN

## 2019-04-28 MED ORDER — AMLODIPINE BESYLATE 5 MG PO TABS
5.0000 mg | ORAL_TABLET | Freq: Every evening | ORAL | Status: DC
  Administered 2019-04-28: 19:00:00 5 mg via ORAL
  Filled 2019-04-28: qty 1

## 2019-04-28 MED ORDER — INSULIN ASPART 100 UNIT/ML ~~LOC~~ SOLN: 0.0000 [IU] | Freq: Three times a day (TID) | SUBCUTANEOUS | Status: DC

## 2019-04-28 MED ORDER — VITAMIN C 500 MG PO TABS
1000.0000 mg | ORAL_TABLET | Freq: Every day | ORAL | Status: DC
  Administered 2019-04-28 – 2019-04-29 (×2): 1000 mg via ORAL
  Filled 2019-04-28 (×2): qty 2

## 2019-04-28 NOTE — ED Notes (Signed)
Lunch Tray Ordered @ 1100. 
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
## 2019-04-28 NOTE — ED Notes (Signed)
Tele

## 2019-04-28 NOTE — ED Notes (Signed)
Breakfast Ordered 
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
## 2019-04-28 NOTE — ED Provider Notes (Signed)
12:30 AM Assumed care from Dr. Sherry Ruffing.  Patient is 83 y.o. F who presents to ED with fever, malaise x1 day.  She is hypoxic with ambulation with sats in the low 90s at rest on room air.  Does not wear oxygen chronically.  States she feels short of breath with exertion.  No chest pain.  Previous provider concerned for possible Covid.  Temp at home was 100.5.  Took Tylenol prior to arrival.  Plan is for admission given hypoxia.  Chest x-ray clear.  Urine shows no sign of infection.  12:36 AM Discussed patient's case with hospitalist, Dr. Hal Hope.  I have recommended admission and patient (and family if present) agree with this plan. Admitting physician will place admission orders.   I reviewed all nursing notes, vitals, pertinent previous records and interpreted all EKGs, lab and urine results, imaging (as available).     Carrie Monroe was evaluated in Emergency Department on 04/28/2019 for the symptoms described in the history of present illness. She was evaluated in the context of the global COVID-19 pandemic, which necessitated consideration that the patient might be at risk for infection with the SARS-CoV-2 virus that causes COVID-19. Institutional protocols and algorithms that pertain to the evaluation of patients at risk for COVID-19 are in a state of rapid change based on information released by regulatory bodies including the CDC and federal and state organizations. These policies and algorithms were followed during the patient's care in the ED.    Carrie Monroe, Carrie Bison, DO 04/28/19 [phone]

## 2019-04-28 NOTE — ED Notes (Signed)
Patient had some difficulty ambulating in room. Initial Sp02 upon sitting is 90% on RA. Patient ambulated in room w/ tech assistance; noted Sp02 dropped to 85%.

## 2019-04-28 NOTE — Progress Notes (Signed)
PROGRESS NOTE    [name]  PVX:[id number] DOB: [birth year] DOA: 04/27/2019 PCP: Marton Redwood, MD      Brief Narrative:  Carrie Monroe is a 83 y.o. F with CAD s/p PCI 2018 x2, dCHF, CKD IIIb baseline 1.5, DM, obesity, BrCa s/p lumpectomy and radiation, TAVR, and recent CEA who presented with 1 day fever and chills.  Over the last 24 to 48 hours, the patient has been having new weakness, diarrhea, decreased oral intake.  Finally she was unable to stand so she came to the ER.  In the ER she appeared short of breath, and was weak.  Chest x-ray clear.  ECG showed old LBBB.  Creatinine at baseline.  WBC 12.8K, hemoglobin 10.2.      Assessment & Plan:  Fever Respiratory virus panel negative.  Covid negative x2.  Chest x-ray clear.  Urinalysis without nitrites, leukocytes. -Follow blood and urine cultures -Follow-up procalcitonin -Hold antibiotics and monitor fever curve   Exertional hypoxia Covid negative x2.  She has had no known contacts, and so I feel this is effectively ruled out.  D-dimer is less than the age-adjusted cut off, no chest pain, I feel PE is less likely.  CKD stage III Creatinine stable relative to baseline  [place] metabolic acidosis Bicarb 18.  Likely from diarrhea.  Hypertension Coronary disease secondary prevention Blood pressure slightly elevated -Continue amlodipine -Continue aspirin, Plavix, Crestor -Hold hydralazine, olmesartan  Chronic diastolic CHF Takes Lasix as needed only at baseline.  EF 45 to 50% at baseline. Volume status difficult to assess given body habitus.  Given elevated BNP, will trial 1 dose Lasix. -Give home oral Lasix -Repeat BMP tomorrow -Hold olmesartan, hydralazine  Diabetes -Continue sliding scale corrections  Anemia of chronic kidney disease Hemoglobin stable relative to baseline  Chronic thrombocytopenia Platelets stable relative to baseline        DVT prophylaxis: Lovenox Code Status: Full code  Family Communication: Daughter by phone MDM and disposition Plan: This is a no charge note.  For further details, please see H&P by my partner Dr. Hal Hope from earlier today.  The below labs and imaging reports were reviewed and summarized above.    The patient was admitted with fever, work up so far unrevealing.  She had some hypoxia noted in the ER with ambulation.  If cultures negative tomorrow, fever curve improving, and O2 saturation better, likely home tomorrow with PCP follow up.        Objective: Vitals:   04/28/19 0800 04/28/19 0830 04/28/19 0900 04/28/19 0930  BP: (!) 150/54 (!) 156/73 (!) 150/57 (!) 152/56  Pulse: 61 65 73 68  Resp: _0 Temp:      TempSrc:      SpO2: 97% 97% 98% 99%   No intake or output data in the 24 hours ending 04/28/19 1012 There were no vitals filed for this visit.  Examination: The patient was seen and examined.      Data Reviewed: I have personally reviewed following labs and imaging studies:  CBC: Recent Labs  Lab 04/27/19 1631 04/28/19 0534  WBC 12.8* 9.3  NEUTROABS 11.5*  --   HGB 10.2* 9.7*  HCT 31.4* 30.0*  MCV 99.7 99.7  PLT 106* 91*   Basic Metabolic Panel: Recent Labs  Lab 04/27/19 1631 04/28/19 0618  NA 137 139  K 4.2 4.1  CL 107 108  CO2 19* 18*  GLUCOSE 168* 135*  BUN 29* 27*  CREATININE 1.45* 1.33*  CALCIUM 9.5  9.4   GFR: CrCl cannot be calculated (Unknown ideal weight.). Liver Function Tests: Recent Labs  Lab 04/27/19 2158  AST 24  ALT 17  ALKPHOS 96  BILITOT 1.1  PROT 6.8  ALBUMIN 3.4*   Recent Labs  Lab 04/27/19 2158  LIPASE 25   No results for input(s): AMMONIA in the last 168 hours. Coagulation Profile: No results for input(s): INR, PROTIME in the last 168 hours. Cardiac Enzymes: No results for input(s): CKTOTAL, CKMB, CKMBINDEX, TROPONINI in the last 168 hours. BNP (last 3 results) No results for input(s): PROBNP in the last 8760 hours. HbA1C: No results for  input(s): HGBA1C in the last 72 hours. CBG: Recent Labs  Lab 04/28/19 0838  GLUCAP 126*   Lipid Profile: No results for input(s): CHOL, HDL, LDLCALC, TRIG, CHOLHDL, LDLDIRECT in the last 72 hours. Thyroid Function Tests: Recent Labs    04/27/19 2200  TSH 1.022   Anemia Panel: No results for input(s): VITAMINB12, FOLATE, FERRITIN, TIBC, IRON, RETICCTPCT in the last 72 hours. Urine analysis:    Component Value Date/Time   COLORURINE YELLOW 04/27/2019 1700   APPEARANCEUR CLEAR 04/27/2019 1700   LABSPEC 1.014 04/27/2019 1700   PHURINE 5.0 04/27/2019 1700   GLUCOSEU NEGATIVE 04/27/2019 1700   HGBUR SMALL (A) 04/27/2019 1700   BILIRUBINUR NEGATIVE 04/27/2019 1700   KETONESUR NEGATIVE 04/27/2019 1700   PROTEINUR 100 (A) 04/27/2019 1700   UROBILINOGEN 1.0 07/28/2013 1305   NITRITE NEGATIVE 04/27/2019 1700   LEUKOCYTESUR NEGATIVE 04/27/2019 1700   Sepsis Labs: @LABRCNTIP(procalcitonin:4,lacticacidven:4)  ) Recent Results (from the past 240 hour(s))  Respiratory Panel by PCR     Status: None   Collection Time: 04/27/19 11:27 PM   Specimen: Nasopharyngeal Swab; Respiratory  Result Value Ref Range Status   Adenovirus NOT DETECTED NOT DETECTED Final   Coronavirus 229E NOT DETECTED NOT DETECTED Final    Comment: (NOTE) The Coronavirus on the Respiratory Panel, DOES NOT test for the novel  Coronavirus (2019 nCoV)    Coronavirus HKU1 NOT DETECTED NOT DETECTED Final   Coronavirus NL63 NOT DETECTED NOT DETECTED Final   Coronavirus OC43 NOT DETECTED NOT DETECTED Final   Metapneumovirus NOT DETECTED NOT DETECTED Final   Rhinovirus / Enterovirus NOT DETECTED NOT DETECTED Final   Influenza A NOT DETECTED NOT DETECTED Final   Influenza B NOT DETECTED NOT DETECTED Final   Parainfluenza Virus 1 NOT DETECTED NOT DETECTED Final   Parainfluenza Virus 2 NOT DETECTED NOT DETECTED Final   Parainfluenza Virus 3 NOT DETECTED NOT DETECTED Final   Parainfluenza Virus 4 NOT DETECTED NOT  DETECTED Final   Respiratory Syncytial Virus NOT DETECTED NOT DETECTED Final   Bordetella pertussis NOT DETECTED NOT DETECTED Final   Chlamydophila pneumoniae NOT DETECTED NOT DETECTED Final   Mycoplasma pneumoniae NOT DETECTED NOT DETECTED Final    Comment: Performed at [facility], [address] [place],  [address]  SARS CORONAVIRUS 2 (TAT 6-24 HRS) Nasopharyngeal Nasopharyngeal Swab     Status: None   Collection Time: 04/27/19 11:36 PM   Specimen: Nasopharyngeal Swab  Result Value Ref Range Status   SARS Coronavirus 2 NEGATIVE NEGATIVE Final    Comment: (NOTE) SARS-CoV-2 target nucleic acids are NOT DETECTED. The SARS-CoV-2 RNA is generally detectable in upper and lower respiratory specimens during the acute phase of infection. Negative results do not preclude SARS-CoV-2 infection, do not rule out co-infections with other pathogens, and should not be used as the sole basis for treatment or other patient   management decisions. Negative results must be combined with clinical observations, patient history, and epidemiological information. The expected result is Negative. Fact Sheet for Patients: SugarRoll.be Fact Sheet for Healthcare Providers: https://www.woods-mathews.com/ This test is not yet approved or cleared by the [place] FDA and  has been authorized for detection and/or diagnosis of SARS-CoV-2 by FDA under an Emergency Use Authorization (EUA). This EUA will remain  in effect (meaning this test can be used) for the duration of the COVID-19 declaration under Section 56 4(b)(1) of the Act, 21 U.S.C. section 360bbb-3(b)(1), unless the authorization is terminated or revoked sooner. Performed at [facility], [place] [address], [place], [place] [address]          Radiology Studies: Dg Chest 2 View  Result Date: 04/27/2019 CLINICAL DATA:  Fever EXAM: CHEST - 2 VIEW COMPARISON:  November 04, 2017 FINDINGS:  Chronic interstitial prominence. No focal consolidation or pulmonary edema. No pleural effusion or pneumothorax. Stable cardiomediastinal contours with mild cardiomegaly. Post endovascular aortic valve replacement. IMPRESSION: No acute process in the chest. Electronically Signed   By: Macy Mis M.D.   On: 04/27/2019 16:54        Scheduled Meds: . amLODipine  5 mg Oral QPM  . aspirin EC  81 mg Oral QHS  . clopidogrel  75 mg Oral Daily  . enoxaparin (LOVENOX) injection  40 mg Subcutaneous Q24H  . insulin aspart  0-9 Units Subcutaneous TID WC  . rosuvastatin  10 mg Oral q1800  . vitamin C  1,000 mg Oral Daily   Continuous Infusions:   LOS: 0 days    Time spent: 14 minutes    Edwin Dada, MD [facility] 04/28/2019, 10:12 AM     Please page though [place] or Epic secure chat:  For password, contact charge nurse

## 2019-04-28 NOTE — ED Notes (Signed)
Ordered diet tray 

## 2019-04-28 NOTE — Evaluation (Signed)
Physical Therapy Evaluation Patient Details Name: Carrie Monroe MRN: [id number] DOB: [birth year] Today's Date: 04/28/2019   History of Present Illness  Mrs. Carrie Monroe is a 83 y.o. F with CAD s/p PCI 2018 x2, dCHF, CKD IIIb baseline 1.5, DM, obesity, BrCa s/p lumpectomy and radiation, TAVR, and recent CEA who presented with 1 day fever and chills.  Clinical Impression  Patient presents with decreased mobility due to generalized weakness.  She was able to ambulate without assist after up on her feet for a bit.  She will have family in and out able to assist if needed.  Feel she will benefit from follow up [place] at d/c and possibly [facility] aide for bathing assist for safety.  PT to follow if not d/c.     Follow Up Recommendations Home health PT    Equipment Recommendations  None recommended by PT(has a walker at home)    Recommendations for Other Services       Precautions / Restrictions Precautions Precautions: Fall      Mobility  Bed Mobility Overal bed mobility: Needs Assistance Bed Mobility: Supine to Sit;Sit to Supine     Supine to sit: Mod assist;HOB elevated Sit to supine: Mod assist   General bed mobility comments: up from stretcher in ED and back to supine assist to lift trunk or legs  Transfers Overall transfer level: Needs assistance Equipment used: 1 person hand held assist Transfers: Sit to/from Stand Sit to Stand: Min assist;Supervision         General transfer comment: initially min A with HHA to stand from stretcher, then able to get up from toilet in restroom with grabbar on her own  Ambulation/Gait Ambulation/Gait assistance: Supervision;Min guard;Min assist Gait Distance (Feet): 75 Feet(& 25')   Gait Pattern/deviations: Step-through pattern;Decreased stride length     General Gait Details: initially reliant on HHA on R for balance with ambulation, then after ambulating needed to toilet so minguard to S ambulating to bathroom  Stairs             Wheelchair Mobility    Modified Rankin (Stroke Patients Only)       Balance Overall balance assessment: Needs assistance   Sitting balance-Leahy Scale: Good     Standing balance support: No upper extremity supported;During functional activity Standing balance-Leahy Scale: Fair Standing balance comment: washed hands in bathroom unaided                             Pertinent Vitals/Pain Pain Assessment: No/denies pain    Home Living Family/patient expects to be discharged to:: Private residence Living Arrangements: Alone Available Help at Discharge: Family;Available PRN/intermittently Type of Home: House Home Access: Ramped entrance     Home Layout: One level Home Equipment: Walker - 2 wheels      Prior Function Level of Independence: Independent               Hand Dominance   Dominant Hand: Right    Extremity/Trunk Assessment   Upper Extremity Assessment Upper Extremity Assessment: RUE deficits/detail;LUE deficits/detail RUE Deficits / Details: AROM WFL except shoulder flexion AAROM WFL, strength shoulder flexion 3-/5, elbow flexion 4/5 LUE Deficits / Details: AROM WFL except shoulder flexion 90 degrees AROM reports limited previously, elbow 4/5    Lower Extremity Assessment Lower Extremity Assessment: Generalized weakness       Communication   Communication: No difficulties  Cognition Arousal/Alertness: Awake/alert Behavior During Therapy: WFL for tasks assessed/performed Overall  Cognitive Status: Within Functional Limits for tasks assessed                                        General Comments General comments (skin integrity, edema, etc.): VSS, SpO2 91% after ambulation on RA    Exercises     Assessment/Plan    PT Assessment Patient needs continued PT services  PT Problem List Decreased activity tolerance;Decreased mobility;Decreased balance;Decreased knowledge of use of DME;Decreased safety awareness        PT Treatment Interventions DME instruction;Stair training;Therapeutic activities;Therapeutic exercise;Functional mobility training;Gait training;Patient/family education    PT Goals (Current goals can be found in the Care Plan section)  Acute Rehab PT Goals Patient Stated Goal: to go home PT Goal Formulation: With patient Time For Goal Achievement: 05/05/19 Potential to Achieve Goals: Good    Frequency Min 3X/week   Barriers to discharge        Co-evaluation               AM-PAC PT "6 Clicks" Mobility  Outcome Measure Help needed turning from your back to your side while in a flat bed without using bedrails?: A Little Help needed moving from lying on your back to sitting on the side of a flat bed without using bedrails?: A Lot Help needed moving to and from a bed to a chair (including a wheelchair)?: A Little Help needed standing up from a chair using your arms (e.g., wheelchair or bedside chair)?: A Little Help needed to walk in hospital room?: A Little Help needed climbing 3-5 steps with a railing? : A Little 6 Click Score: 17    End of Session   Activity Tolerance: Patient limited by fatigue Patient left: in bed;with call bell/phone within reach   PT Visit Diagnosis: Muscle weakness (generalized) (M62.81);Other abnormalities of gait and mobility (R26.89)    Time: 8295-6213 PT Time Calculation (min) (ACUTE ONLY): 28 min   Charges:   PT Evaluation $PT Eval Moderate Complexity: 1 Mod PT Treatments $Gait Training: 8-22 mins        Carrie Monroe, PT Acute Rehabilitation Services [phone] 04/28/2019   Carrie Monroe 04/28/2019, 2:25 PM

## 2019-04-29 DIAGNOSIS — I10 Essential (primary) hypertension: Secondary | ICD-10-CM | POA: Diagnosis not present

## 2019-04-29 DIAGNOSIS — R0902 Hypoxemia: Secondary | ICD-10-CM

## 2019-04-29 DIAGNOSIS — R509 Fever, unspecified: Secondary | ICD-10-CM | POA: Diagnosis not present

## 2019-04-29 DIAGNOSIS — N183 Chronic kidney disease, stage 3 unspecified: Secondary | ICD-10-CM | POA: Diagnosis not present

## 2019-04-29 DIAGNOSIS — R6883 Chills (without fever): Secondary | ICD-10-CM | POA: Diagnosis not present

## 2019-04-29 DIAGNOSIS — E1169 Type 2 diabetes mellitus with other specified complication: Secondary | ICD-10-CM | POA: Diagnosis not present

## 2019-04-29 LAB — GLUCOSE, CAPILLARY
Glucose-Capillary: 100 mg/dL — ABNORMAL HIGH (ref 70–99)
Glucose-Capillary: 150 mg/dL — ABNORMAL HIGH (ref 70–99)

## 2019-04-29 LAB — CBC
HCT: 27.3 % — ABNORMAL LOW (ref 36.0–46.0)
Hemoglobin: 8.9 g/dL — ABNORMAL LOW (ref 12.0–15.0)
MCH: 31.7 pg (ref 26.0–34.0)
MCHC: 32.6 g/dL (ref 30.0–36.0)
MCV: 97.2 fL (ref 80.0–100.0)
Platelets: 97 10*3/uL — ABNORMAL LOW (ref 150–400)
RBC: 2.81 MIL/uL — ABNORMAL LOW (ref 3.87–5.11)
RDW: 13.5 % (ref 11.5–15.5)
WBC: 7 10*3/uL (ref 4.0–10.5)
nRBC: 0 % (ref 0.0–0.2)

## 2019-04-29 LAB — COMPREHENSIVE METABOLIC PANEL
ALT: 13 U/L (ref 0–44)
AST: 17 U/L (ref 15–41)
Albumin: 2.7 g/dL — ABNORMAL LOW (ref 3.5–5.0)
Alkaline Phosphatase: 82 U/L (ref 38–126)
Anion gap: 9 (ref 5–15)
BUN: 34 mg/dL — ABNORMAL HIGH (ref 8–23)
CO2: 22 mmol/L (ref 22–32)
Calcium: 8.8 mg/dL — ABNORMAL LOW (ref 8.9–10.3)
Chloride: 106 mmol/L (ref 98–111)
Creatinine, Ser: 1.47 mg/dL — ABNORMAL HIGH (ref 0.44–1.00)
GFR calc Af Amer: 38 mL/min — ABNORMAL LOW (ref >60)
GFR calc non Af Amer: 33 mL/min — ABNORMAL LOW (ref >60)
Glucose, Bld: 129 mg/dL — ABNORMAL HIGH (ref 70–99)
Potassium: 3.9 mmol/L (ref 3.5–5.1)
Sodium: 137 mmol/L (ref 135–145)
Total Bilirubin: 0.6 mg/dL (ref 0.3–1.2)
Total Protein: 5.4 g/dL — ABNORMAL LOW (ref 6.5–8.1)

## 2019-04-29 MED ORDER — ONDANSETRON HCL 4 MG PO TABS: 4.0000 mg | ORAL_TABLET | Freq: Four times a day (QID) | ORAL | 0 refills | Status: DC | PRN

## 2019-04-29 NOTE — Progress Notes (Signed)
Pt given discharge instructions, prescriptions, and care notes. Pt verbalized understanding AEB no further questions or concerns at this time. IV was discontinued, no redness, pain, or swelling noted at this time. Telemetry discontinued and Centralized Telemetry was notified. Pt left the floor via wheelchair with staff in stable condition. 

## 2019-04-29 NOTE — Discharge Summary (Signed)
Physician Discharge Summary  IVONE LICHT VOJ:[id number] DOB: [birth year] DOA: 04/27/2019  PCP: Marton Redwood, MD  Admit date: 04/27/2019 Discharge date: 04/29/2019  Admitted From: Home Disposition: Home  Recommendations for Outpatient Follow-up:  1. Follow up with PCP in 1 week with repeat CBC/BMP 2. Follow up in ED if symptoms worsen or new appear   Home Health: PT Equipment/Devices: None  Discharge Condition: Stable CODE STATUS: Full Diet recommendation: Heart healthy/carb modified  Brief/Interim Summary: 83 year old female with history of CAD status post PCI, diastolic dysfunction, left breast cancer status post lumpectomy and radiation, chronic kidney disease stage III, recent carotid endarterectomy, status post TAVR presented with fever and chills.  Chest x-ray was unremarkable.  She had mild leukocytosis.  COVID-19 test was negative.  She initially was hypoxic.  During the hospitalization, her condition improved.  Cultures have been negative so far.  She is afebrile.  She is hemodynamically stable.  She will be discharged home today with home health PT.  Discharge Diagnoses:   Fever and exertional hypoxia -Questionable cause.  COVID-19 and respiratory virus panel negative.  Chest x-ray clear.  Urinalysis negative. -Cultures have been negative so far.  Patient has remained afebrile off antibiotics.  Feels much better.  Will discharge home today on no antibiotics.  Currently on room air.  Chronic kidney disease stage III -Creatinine stable at baseline.  Outpatient follow-up  [place] metabolic acidosis -Likely from diarrhea.  Improved.  Hypertension -Continue amlodipine, hydralazine, Lasix.  Outpatient follow-up  Chronic diastolic CHF -Compensated.  EF 45 to 50%.  Resume home regimen.  Outpatient follow-up with cardiology.  Diabetes mellitus type 2 -Carb modified diet.  Outpatient follow-up  Anemia of chronic disease -Hemoglobin stable.  Outpatient  follow-up  Chronic thrombocytopenia -Stable.  Outpatient follow-up.  No signs of bleeding.  Discharge Instructions  Discharge Instructions    Diet - low sodium heart healthy   Complete by: As directed    Diet Carb Modified   Complete by: As directed    Increase activity slowly   Complete by: As directed      Allergies as of 04/29/2019      Reactions   Oxycodone Other (See Comments)   Hallunication      Medication List    TAKE these medications   amLODipine 5 MG tablet Commonly known as: NORVASC Take 1 tablet (5 mg total) by mouth every evening.   aspirin EC 81 MG tablet Take 81 mg by mouth at bedtime.   clopidogrel 75 MG tablet Commonly known as: Plavix Take 1 tablet (75 mg total) by mouth daily.   colchicine 0.6 MG tablet Take 0.6 mg by mouth daily as needed (gout flare up).   COLLAGEN PO Take 5 g by mouth daily. power   furosemide 40 MG tablet Commonly known as: LASIX TAKE 1 TABLET BY MOUTH DAILY AS NEEDED FOR FLUID OR EDEMA What changed: See the new instructions.   hydrALAZINE 50 MG tablet Commonly known as: APRESOLINE Take 25 mg by mouth See admin instructions. Take 25mg  by mouth at bedtime and up to three times daily as needed for blood pressure above 160.   ondansetron 4 MG tablet Commonly known as: ZOFRAN Take 1 tablet (4 mg total) by mouth every 6 (six) hours as needed for nausea.   rosuvastatin 10 MG tablet Commonly known as: CRESTOR Take 10 mg by mouth daily.   VITAMIN B 12 PO Take 1 mL by mouth daily. liquid   vitamin C 1000 MG tablet Take 1,000  mg by mouth daily.   VITAMIN D PO Take 2,000 Units by mouth daily.   zinc sulfate 220 (50 Zn) MG capsule Take 220 mg by mouth daily.      Follow-up Information    [facility] Follow up.   Specialty: Home Health Services Why: Home Health PT services resumed.  Contact information: [address] [place] [address] [phone]        Marton Redwood, MD. Schedule an  appointment as soon as possible for a visit in 1 week(s).   Specialty: Internal Medicine Why: with repeat cbc/bmp Contact information: [place] [address] [phone]        Sanda Klein, MD .   Specialty: Cardiology Contact information: [address] [place] [address] [phone]          Allergies  Allergen Reactions  . Oxycodone Other (See Comments)    Hallunication    Consultations:  None   Procedures/Studies: Dg Chest 2 View  Result Date: 04/27/2019 CLINICAL DATA:  Fever EXAM: CHEST - 2 VIEW COMPARISON:  November 04, 2017 FINDINGS: Chronic interstitial prominence. No focal consolidation or pulmonary edema. No pleural effusion or pneumothorax. Stable cardiomediastinal contours with mild cardiomegaly. Post endovascular aortic valve replacement. IMPRESSION: No acute process in the chest. Electronically Signed   By: Macy Mis M.D.   On: 04/27/2019 16:54   Vas US Carotid  Result Date: 04/13/2019 Carotid Arterial Duplex Study Indications:   Carotid artery disease. Other Factors: Transcarotid artery revascularization on the right. Performing Technologist: Alvia Grove RVT  Examination Guidelines: A complete evaluation includes B-mode imaging, spectral Doppler, color Doppler, and power Doppler as needed of all accessible portions of each vessel. Bilateral testing is considered an integral part of a complete examination. Limited examinations for reoccurring indications may be performed as noted.  Right Carotid Findings: +----------+--------+--------+--------+------------------+--------+           PSV cm/sEDV cm/sStenosisPlaque DescriptionComments +----------+--------+--------+--------+------------------+--------+ CCA Prox  144     16                                         +----------+--------+--------+--------+------------------+--------+ CCA Mid   107     15                                          +----------+--------+--------+--------+------------------+--------+ CCA Distal120     14                                         +----------+--------+--------+--------+------------------+--------+ ICA Distal111     26                                         +----------+--------+--------+--------+------------------+--------+ ECA       141     4                                          +----------+--------+--------+--------+------------------+--------+ +----------+--------+-------+----------------+-------------------+  PSV cm/sEDV cmsDescribe        Arm Pressure (mmHG) +----------+--------+-------+----------------+-------------------+ Subclavian160     2      Multiphasic, WNL                    +----------+--------+-------+----------------+-------------------+ +---------+--------+--+--------+--+---------+ VertebralPSV cm/s73EDV cm/s16Antegrade +---------+--------+--+--------+--+---------+  Right Stent(s): +---------------+---+--++++ Prox to Stent  95 14 +---------------+---+--++++ Proximal Stent 95 13 +---------------+---+--++++ Mid Stent      86 20 +---------------+---+--++++ Distal Stent   10221 +---------------+---+--++++ Distal to [id number] +---------------+---+--++++   Left Carotid Findings: +----------+--------+--------+--------+------------------+---------------------+           PSV cm/sEDV cm/sStenosisPlaque DescriptionComments              +----------+--------+--------+--------+------------------+---------------------+ CCA Prox  122     15                                                      +----------+--------+--------+--------+------------------+---------------------+ CCA Mid   97      20                                                      +----------+--------+--------+--------+------------------+---------------------+ CCA Distal88      13              heterogenous                             +----------+--------+--------+--------+------------------+---------------------+ ICA Prox  169     43      40-59%  calcific          not well visualized                                                       in its entirety       +----------+--------+--------+--------+------------------+---------------------+ ICA Mid   147     36                                                      +----------+--------+--------+--------+------------------+---------------------+ ICA Distal121     29                                                      +----------+--------+--------+--------+------------------+---------------------+ ECA       114     5                                                       +----------+--------+--------+--------+------------------+---------------------+ +----------+--------+--------+----------------+-------------------+           PSV cm/sEDV  cm/sDescribe        Arm Pressure (mmHG) +----------+--------+--------+----------------+-------------------+ Subclavian204     0       Multiphasic, WNL                    +----------+--------+--------+----------------+-------------------+ +---------+--------+--+--------+-+----------------------------+ VertebralPSV cm/s66EDV cm/s5Antegrade and High resistant +---------+--------+--+--------+-+----------------------------+  Right Carotid: Patent transcarotid revascularization with no visualized                stenosis. Left Carotid: Velocities in the left ICA are consistent with a 40-59% stenosis.               Calcific plaque may obscure higher velocity. Vertebrals:  Right vertebral artery demonstrates antegrade flow. Left vertebral              artery demonstrates high resistant flow, not well visualized. Subclavians: Normal flow hemodynamics were seen in bilateral subclavian              arteries. *See table(s) above for measurements and observations.  Electronically signed by Harold Barban MD on 04/13/2019 at  4:22:08 PM.    Final        Subjective: Patient seen and examined at bedside.  She feels much better and wants to go home.  No overnight fever, nausea, vomiting or worsening shortness of breath.  Discharge Exam: Vitals:   04/28/19 2245 04/29/19 0505  BP: (!) 138/46 (!) 142/49  Pulse: 70 (!) 50  Resp: 18 18  Temp: 99.6 F (37.6 C) 99.9 F (37.7 C)  SpO2: 90% 92%    General: Pt is alert, awake, not in acute distress Cardiovascular: Mildly bradycardic, S1/S2 + Respiratory: bilateral decreased breath sounds at bases with some scattered crackles Abdominal: Soft, NT, ND, bowel sounds + Extremities: no edema, no cyanosis    The results of significant diagnostics from this hospitalization (including imaging, microbiology, ancillary and laboratory) are listed below for reference.     Microbiology: Recent Results (from the past 240 hour(s))  Urine culture     Status: Abnormal   Collection Time: 04/27/19  5:01 PM   Specimen: Urine, Random  Result Value Ref Range Status   Specimen Description URINE, RANDOM  Final   Special Requests NONE  Final   Culture (A)  Final    <10,000 COLONIES/mL INSIGNIFICANT GROWTH Performed at [facility], [place] [address], [place], [place] [address]    Report Status 04/28/2019 FINAL  Final  Respiratory Panel by PCR     Status: None   Collection Time: 04/27/19 11:27 PM   Specimen: Nasopharyngeal Swab; Respiratory  Result Value Ref Range Status   Adenovirus NOT DETECTED NOT DETECTED Final   Coronavirus 229E NOT DETECTED NOT DETECTED Final    Comment: (NOTE) The Coronavirus on the Respiratory Panel, DOES NOT test for the novel  Coronavirus (2019 nCoV)    Coronavirus HKU1 NOT DETECTED NOT DETECTED Final   Coronavirus NL63 NOT DETECTED NOT DETECTED Final   Coronavirus OC43 NOT DETECTED NOT DETECTED Final   Metapneumovirus NOT DETECTED NOT DETECTED Final   Rhinovirus / Enterovirus NOT DETECTED NOT DETECTED Final   Influenza A NOT DETECTED NOT  DETECTED Final   Influenza B NOT DETECTED NOT DETECTED Final   Parainfluenza Virus 1 NOT DETECTED NOT DETECTED Final   Parainfluenza Virus 2 NOT DETECTED NOT DETECTED Final   Parainfluenza Virus 3 NOT DETECTED NOT DETECTED Final   Parainfluenza Virus 4 NOT DETECTED NOT DETECTED Final   Respiratory Syncytial Virus NOT DETECTED NOT DETECTED Final  Bordetella pertussis NOT DETECTED NOT DETECTED Final   Chlamydophila pneumoniae NOT DETECTED NOT DETECTED Final   Mycoplasma pneumoniae NOT DETECTED NOT DETECTED Final    Comment: Performed at [facility], [place] [address], [place], [place] [address]  SARS CORONAVIRUS 2 (TAT 6-24 HRS) Nasopharyngeal Nasopharyngeal Swab     Status: None   Collection Time: 04/27/19 11:36 PM   Specimen: Nasopharyngeal Swab  Result Value Ref Range Status   SARS Coronavirus 2 NEGATIVE NEGATIVE Final    Comment: (NOTE) SARS-CoV-2 target nucleic acids are NOT DETECTED. The SARS-CoV-2 RNA is generally detectable in upper and lower respiratory specimens during the acute phase of infection. Negative results do not preclude SARS-CoV-2 infection, do not rule out co-infections with other pathogens, and should not be used as the sole basis for treatment or other patient management decisions. Negative results must be combined with clinical observations, patient history, and epidemiological information. The expected result is Negative. Fact Sheet for Patients: SugarRoll.be Fact Sheet for Healthcare Providers: https://www.woods-mathews.com/ This test is not yet approved or cleared by the [place] FDA and  has been authorized for detection and/or diagnosis of SARS-CoV-2 by FDA under an Emergency Use Authorization (EUA). This EUA will remain  in effect (meaning this test can be used) for the duration of the COVID-19 declaration under Section 56 4(b)(1) of the Act, 21 U.S.C. section 360bbb-3(b)(1), unless the authorization is  terminated or revoked sooner. Performed at [facility], [place] [address], [place], [place] [address]   Culture, blood (routine x 2)     Status: None (Preliminary result)   Collection Time: 04/28/19  8:47 AM   Specimen: BLOOD  Result Value Ref Range Status   Specimen Description BLOOD BLOOD RIGHT FOREARM  Final   Special Requests   Final    BOTTLES DRAWN AEROBIC AND ANAEROBIC Blood Culture adequate volume   Culture   Final    NO GROWTH < 24 HOURS Performed at [facility], [place] [address], [place], [place] [address]    Report Status PENDING  Incomplete  Culture, blood (routine x 2)     Status: None (Preliminary result)   Collection Time: 04/28/19  6:30 PM   Specimen: BLOOD  Result Value Ref Range Status   Specimen Description BLOOD LEFT ANTECUBITAL  Final   Special Requests   Final    BOTTLES DRAWN AEROBIC AND ANAEROBIC Blood Culture adequate volume   Culture   Final    NO GROWTH < 12 HOURS Performed at [facility], [place] [address], [place], [place] [address]    Report Status PENDING  Incomplete     Labs: BNP (last 3 results) Recent Labs    04/27/19 2338  BNP 916.9*   Basic Metabolic Panel: Recent Labs  Lab 04/27/19 1631 04/28/19 0618 04/29/19 0205  NA 137 139 137  K 4.2 4.1 3.9  CL 107 108 106  CO2 19* 18* 22  GLUCOSE 168* 135* 129*  BUN 29* 27* 34*  CREATININE 1.45* 1.33* 1.47*  CALCIUM 9.5 9.4 8.8*   Liver Function Tests: Recent Labs  Lab 04/27/19 2158 04/29/19 0205  AST 24 17  ALT 17 13  ALKPHOS 96 82  BILITOT 1.1 0.6  PROT 6.8 5.4*  ALBUMIN 3.4* 2.7*   Recent Labs  Lab 04/27/19 2158  LIPASE 25   No results for input(s): AMMONIA in the last 168 hours. CBC: Recent Labs  Lab 04/27/19 1631 04/28/19 0534 04/29/19 0205  WBC 12.8* 9.3  7.0  NEUTROABS 11.5*  --   --   HGB 10.2* 9.7* 8.9*  HCT 31.4* 30.0* 27.3*  MCV 99.7 99.7 97.2  PLT 106* 91* 97*   Cardiac Enzymes: No results for input(s): CKTOTAL, CKMB, CKMBINDEX,  TROPONINI in the last 168 hours. BNP: Invalid input(s): POCBNP CBG: Recent Labs  Lab 04/28/19 0838 04/28/19 1151 04/28/19 1836 04/29/19 0802 04/29/19 1153  GLUCAP 126* 109* 115* 100* 150*   D-Dimer Recent Labs    04/28/19 0530  DDIMER 0.73*   Hgb A1c No results for input(s): HGBA1C in the last 72 hours. Lipid Profile No results for input(s): CHOL, HDL, LDLCALC, TRIG, CHOLHDL, LDLDIRECT in the last 72 hours. Thyroid function studies Recent Labs    04/27/19 2200  TSH 1.022   Anemia work up No results for input(s): VITAMINB12, FOLATE, FERRITIN, TIBC, IRON, RETICCTPCT in the last 72 hours. Urinalysis    Component Value Date/Time   COLORURINE YELLOW 04/27/2019 1700   APPEARANCEUR CLEAR 04/27/2019 1700   LABSPEC 1.014 04/27/2019 1700   PHURINE 5.0 04/27/2019 1700   GLUCOSEU NEGATIVE 04/27/2019 1700   HGBUR SMALL (A) 04/27/2019 1700   BILIRUBINUR NEGATIVE 04/27/2019 1700   KETONESUR NEGATIVE 04/27/2019 1700   PROTEINUR 100 (A) 04/27/2019 1700   UROBILINOGEN 1.0 07/28/2013 1305   NITRITE NEGATIVE 04/27/2019 1700   LEUKOCYTESUR NEGATIVE 04/27/2019 1700   Sepsis Labs Invalid input(s): PROCALCITONIN,  WBC,  LACTICIDVEN Microbiology Recent Results (from the past 240 hour(s))  Urine culture     Status: Abnormal   Collection Time: 04/27/19  5:01 PM   Specimen: Urine, Random  Result Value Ref Range Status   Specimen Description URINE, RANDOM  Final   Special Requests NONE  Final   Culture (A)  Final    <10,000 COLONIES/mL INSIGNIFICANT GROWTH Performed at [facility], [address], [place], [place] [address]    Report Status 04/28/2019 FINAL  Final  Respiratory Panel by PCR     Status: None   Collection Time: 04/27/19 11:27 PM   Specimen: Nasopharyngeal Swab; Respiratory  Result Value Ref Range Status   Adenovirus NOT DETECTED NOT DETECTED Final   Coronavirus 229E NOT DETECTED NOT DETECTED Final    Comment: (NOTE) The Coronavirus on the Respiratory  Panel, DOES NOT test for the novel  Coronavirus (2019 nCoV)    Coronavirus HKU1 NOT DETECTED NOT DETECTED Final   Coronavirus NL63 NOT DETECTED NOT DETECTED Final   Coronavirus OC43 NOT DETECTED NOT DETECTED Final   Metapneumovirus NOT DETECTED NOT DETECTED Final   Rhinovirus / Enterovirus NOT DETECTED NOT DETECTED Final   Influenza A NOT DETECTED NOT DETECTED Final   Influenza B NOT DETECTED NOT DETECTED Final   Parainfluenza Virus 1 NOT DETECTED NOT DETECTED Final   Parainfluenza Virus 2 NOT DETECTED NOT DETECTED Final   Parainfluenza Virus 3 NOT DETECTED NOT DETECTED Final   Parainfluenza Virus 4 NOT DETECTED NOT DETECTED Final   Respiratory Syncytial Virus NOT DETECTED NOT DETECTED Final   Bordetella pertussis NOT DETECTED NOT DETECTED Final   Chlamydophila pneumoniae NOT DETECTED NOT DETECTED Final   Mycoplasma pneumoniae NOT DETECTED NOT DETECTED Final    Comment: Performed at [facility], [place], [place], [place] [address]  SARS CORONAVIRUS 2 (TAT 6-24 HRS) Nasopharyngeal Nasopharyngeal Swab     Status: None   Collection Time: 04/27/19 11:36 PM   Specimen: Nasopharyngeal Swab  Result Value Ref Range Status   SARS Coronavirus 2 NEGATIVE NEGATIVE Final  Comment: (NOTE) SARS-CoV-2 target nucleic acids are NOT DETECTED. The SARS-CoV-2 RNA is generally detectable in upper and lower respiratory specimens during the acute phase of infection. Negative results do not preclude SARS-CoV-2 infection, do not rule out co-infections with other pathogens, and should not be used as the sole basis for treatment or other patient management decisions. Negative results must be combined with clinical observations, patient history, and epidemiological information. The expected result is Negative. Fact Sheet for Patients: SugarRoll.be Fact Sheet for Healthcare Providers: https://www.woods-mathews.com/ This test is not yet approved or  cleared by the [place] FDA and  has been authorized for detection and/or diagnosis of SARS-CoV-2 by FDA under an Emergency Use Authorization (EUA). This EUA will remain  in effect (meaning this test can be used) for the duration of the COVID-19 declaration under Section 56 4(b)(1) of the Act, 21 U.S.C. section 360bbb-3(b)(1), unless the authorization is terminated or revoked sooner. Performed at [facility], [place] [address] [place], [place] [address]   Culture, blood (routine x 2)     Status: None (Preliminary result)   Collection Time: 04/28/19  8:47 AM   Specimen: BLOOD  Result Value Ref Range Status   Specimen Description BLOOD BLOOD RIGHT FOREARM  Final   Special Requests   Final    BOTTLES DRAWN AEROBIC AND ANAEROBIC Blood Culture adequate volume   Culture   Final    NO GROWTH < 24 HOURS Performed at [facility], [place] [address], [place], [place] [address]    Report Status PENDING  Incomplete  Culture, blood (routine x 2)     Status: None (Preliminary result)   Collection Time: 04/28/19  6:30 PM   Specimen: BLOOD  Result Value Ref Range Status   Specimen Description BLOOD LEFT ANTECUBITAL  Final   Special Requests   Final    BOTTLES DRAWN AEROBIC AND ANAEROBIC Blood Culture adequate volume   Culture   Final    NO GROWTH < 12 HOURS Performed at [facility], [place] [address], [place], [place] [address]    Report Status PENDING  Incomplete     Time coordinating discharge: 35 minutes  SIGNED:   Aline August, MD  [facility] 04/29/2019, 2:20 PM

## 2019-04-29 NOTE — TOC Initial Note (Signed)
Transition of Care [facility]) - Initial/Assessment Note    Patient Details  Name: Carrie Monroe MRN: [id number] Date of Birth: August 12, 1935  Transition of Care [facility]) CM/SW Contact:    Benard Halsted, LCSW Phone Number: 04/29/2019, 9:18 AM  Clinical Narrative:                 CSW received consult for possible home health services at time of discharge. CSW spoke with patient regarding PT recommendation of Home Health PT at time of discharge. Patient reported that she would like home health services and states that she is already set up with [facility]. She is In agreement to continue PT with them. CSW alerted [facility] of patient's discharge and orders. CSW discussed equipment needs with patient and she declined, stating that she already had a walker and wheelchair at home. CSW confirmed PCP and address with patient. Patient states her family will come pick her up at discharge this afternoon. No further questions reported at this time.    Expected Discharge Plan: [place] Barriers to Discharge: No Barriers Identified   Patient Goals and CMS Choice Patient states their goals for this hospitalization and ongoing recovery are:: Get back home CMS Medicare.gov Compare Post Acute Care list provided to:: Patient Choice offered to / list presented to : Patient(Already set up with [facility])  Expected Discharge Plan and Services Expected Discharge Plan: [place] In-house Referral: NA Discharge Planning Services: CM Consult Post Acute Care Choice: [place] arrangements for the past 2 months: Single Family Home Expected Discharge Date: 04/29/19               DME Arranged: N/A DME Agency: NA       HH Arranged: PT HH Agency: [facility] (formerly [employer]) Date [place]: 04/29/19 Time [place]: [phone] Representative spoke with at [place]: [place]  Prior Living Arrangements/Services Living arrangements  for the past 2 months: [place] with:: Self Patient language and need for interpreter reviewed:: Yes Do you feel safe going back to the place where you live?: Yes      Need for Family Participation in Patient Care: No (Comment) Care giver support system in place?: Yes (comment) Current home services: DME, Home PT Criminal Activity/Legal Involvement Pertinent to Current Situation/Hospitalization: No - Comment as needed  Activities of Daily Living Home Assistive Devices/Equipment: None ADL Screening (condition at time of admission) Patient's cognitive ability adequate to safely complete daily activities?: Yes Is the patient deaf or have difficulty hearing?: No Does the patient have difficulty seeing, even when wearing glasses/contacts?: No Does the patient have difficulty concentrating, remembering, or making decisions?: No Patient able to express need for assistance with ADLs?: Yes Does the patient have difficulty dressing or bathing?: No Independently performs ADLs?: Yes (appropriate for developmental age) Does the patient have difficulty walking or climbing stairs?: Yes Weakness of Legs: Both Weakness of Arms/Hands: None  Permission Sought/Granted Permission sought to share information with : Facility Art therapist granted to share information with : Yes, Verbal Permission Granted     Permission granted to share info w AGENCY: [facility]        Emotional Assessment Appearance:: Appears stated age Attitude/Demeanor/Rapport: Engaged, Gracious Affect (typically observed): Accepting, Appropriate, Pleasant Orientation: : Oriented to Self, Oriented to Place, Oriented to  Time, Oriented to Situation   Psych Involvement: No (comment)  Admission diagnosis:  Chills [  R68.83] Hypoxia [R09.02] Fever, unspecified fever cause [R50.9] Suspected COVID-19 virus infection [Z20.828] Acute respiratory failure with hypoxia ([place]) [J96.01] Patient Active  Problem List   Diagnosis Date Noted  . Fever   . Carotid stenosis 03/13/2019  . Encounter for medication monitoring 11/27/2017  . PICC (peripherally inserted central catheter) in place 11/27/2017  . MSSA bacteremia 11/05/2017  . Wound infection after surgery 11/04/2017  . Anemia 09/27/2017  . Hyperlipidemia   . History of kidney stones   . Hemorrhoids   . Exogenous obesity   . Chronic diastolic CHF (congestive heart failure) ([place])   . Arthritis   . Aortic stenosis, severe   . Acute respiratory failure with hypoxia ([place])   . Hemorrhagic shock ([place])   . 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 Carotid artery stenosis   . Coronary artery disease   . Chronic renal insufficiency, stage III (moderate) 12/22/2016  . Dental abscess- s/p multiple tooth extraction 12/21/16 12/22/2016  . Essential hypertension 01/27/2016  . Morbid obesity due to excess calories ([place]) 01/27/2016  . Gout 06/22/2013  . Dyslipidemia 05/08/2011  . Diabetes mellitus type 2 in obese ([place]) 05/08/2011  . Severe aortic stenosis 05/08/2011  . Breast cancer of upper-outer quadrant of left female breast ([place]) 03/16/2011   PCP:  Marton Redwood, MD Pharmacy:   [place], [address] [place] [address] Phone: [phone] Fax: [phone]  [facility] ORDER) [place], [address] [place] [address] Phone: [phone] Fax: [phone]     Social Determinants of Health (SDOH) Interventions    Readmission Risk Interventions No flowsheet data found.

## 2019-04-29 NOTE — Progress Notes (Signed)
Physical Therapy Treatment Patient Details Name: Carrie Monroe MRN: [id number] DOB: 08/17/1935 Today's Date: 04/29/2019    History of Present Illness Carrie Monroe is a 83 y.o. F with CAD s/p PCI 2018 x2, dCHF, CKD IIIb baseline 1.5, DM, obesity, BrCa s/p lumpectomy and radiation, TAVR, and recent CEA who presented with 1 day fever and chills.    PT Comments    Pt was able to ambulate household distances without AD & min assist on this date, but was limited by BLE weakness & fluctuating SpO2 levels on room air. Pt currently requires physical assist for mobility without AD & will require supervision for mobility at d/c. Will continue to follow acutely to focus on bed mobility, transfers, gait with LRAD, and balance & assist with d/c planning.    Follow Up Recommendations  Home health PT;Supervision for mobility/OOB     Equipment Recommendations  None recommended by PT    Recommendations for Other Services       Precautions / Restrictions Precautions Precautions: Fall Restrictions Weight Bearing Restrictions: No    Mobility  Bed Mobility Overal bed mobility: Needs Assistance Bed Mobility: Supine to Sit     Supine to sit: Min guard;HOB elevated     General bed mobility comments: Extra time, cuing for technique, use of hospital bed features  Transfers Overall transfer level: Needs assistance Equipment used: None Transfers: Sit to/from Stand Sit to Stand: Min guard;Supervision         General transfer comment: sit>stand from EOB  Ambulation/Gait Ambulation/Gait assistance: Min assist Gait Distance (Feet): 100 Feet Assistive device: None Gait Pattern/deviations: Step-through pattern;Decreased stride length Gait velocity: decreased       Stairs             Wheelchair Mobility    Modified Rankin (Stroke Patients Only)       Balance Overall balance assessment: Needs assistance   Sitting balance-Leahy Scale: Good       Standing  balance-Leahy Scale: Fair Standing balance comment: static standing without BUE support with supervision, min assist for gait without BUE support                            Cognition Arousal/Alertness: Awake/alert Behavior During Therapy: WFL for tasks assessed/performed Overall Cognitive Status: Within Functional Limits for tasks assessed                                        Exercises      General Comments General comments (skin integrity, edema, etc.): Pt on room air with SpO2 fluctuating 88-90% at rest, ranging from 75-97% during gait while on room air & pt denying SOB, SpO2 increased to >90% at end of session on room air; therapist educating pt on pursed lip breathing throughout session & RN made aware of pt's fluctuating SpO2 levels during session      Pertinent Vitals/Pain Pain Assessment: No/denies pain    Home Living                      Prior Function            PT Goals (current goals can now be found in the care plan section) Acute Rehab PT Goals Patient Stated Goal: to go home PT Goal Formulation: With patient Time For Goal Achievement: 05/05/19 Potential to Achieve Goals: Good Progress towards  PT goals: Progressing toward goals    Frequency    Min 3X/week      PT Plan Current plan remains appropriate    Co-evaluation              AM-PAC PT "6 Clicks" Mobility   Outcome Measure  Help needed turning from your back to your side while in a flat bed without using bedrails?: A Little Help needed moving from lying on your back to sitting on the side of a flat bed without using bedrails?: A Little Help needed moving to and from a bed to a chair (including a wheelchair)?: A Little Help needed standing up from a chair using your arms (e.g., wheelchair or bedside chair)?: None Help needed to walk in hospital room?: A Little Help needed climbing 3-5 steps with a railing? : A Little 6 Click Score: 19    End of  Session Equipment Utilized During Treatment: Gait belt Activity Tolerance: Patient limited by fatigue Patient left: in chair;with call bell/phone within reach Nurse Communication: (SpO2 during session) PT Visit Diagnosis: Muscle weakness (generalized) (M62.81);Other abnormalities of gait and mobility (R26.89)     Time: 1610-9604 PT Time Calculation (min) (ACUTE ONLY): 23 min  Charges:  $Therapeutic Activity: 23-37 mins(23)                         Waunita Schooner, PT, DPT 04/29/2019, 12:39 PM

## 2019-05-03 LAB — CULTURE, BLOOD (ROUTINE X 2)
Culture: NO GROWTH
Culture: NO GROWTH
Special Requests: ADEQUATE
Special Requests: ADEQUATE

## 2019-05-12 ENCOUNTER — Ambulatory Visit: Payer: Medicare Other

## 2019-05-13 ENCOUNTER — Ambulatory Visit: Payer: Medicare Other | Admitting: Diagnostic Neuroimaging
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
## 2019-05-15 ENCOUNTER — Telehealth (INDEPENDENT_AMBULATORY_CARE_PROVIDER_SITE_OTHER): Payer: Medicare Other | Admitting: Student

## 2019-05-15 ENCOUNTER — Encounter: Payer: Self-pay | Admitting: Physician Assistant

## 2019-05-15 VITALS — BP 139/89 | HR 88 | Ht 63.0 in | Wt 216.0 lb

## 2019-05-15 DIAGNOSIS — I350 Nonrheumatic aortic (valve) stenosis: Secondary | ICD-10-CM

## 2019-05-15 DIAGNOSIS — R42 Dizziness and giddiness: Secondary | ICD-10-CM | POA: Diagnosis not present

## 2019-05-15 DIAGNOSIS — E785 Hyperlipidemia, unspecified: Secondary | ICD-10-CM

## 2019-05-15 DIAGNOSIS — I6523 Occlusion and stenosis of bilateral carotid arteries: Secondary | ICD-10-CM

## 2019-05-15 DIAGNOSIS — I5032 Chronic diastolic (congestive) heart failure: Secondary | ICD-10-CM

## 2019-05-15 DIAGNOSIS — I2510 Atherosclerotic heart disease of native coronary artery without angina pectoris: Secondary | ICD-10-CM

## 2019-05-15 DIAGNOSIS — D649 Anemia, unspecified: Secondary | ICD-10-CM

## 2019-05-15 DIAGNOSIS — I10 Essential (primary) hypertension: Secondary | ICD-10-CM

## 2019-05-15 DIAGNOSIS — N1832 Chronic kidney disease, stage 3b: Secondary | ICD-10-CM

## 2019-05-15 NOTE — Patient Instructions (Addendum)
Medication Instructions:  1.)  Start taking your Amlodipine at bedtime   2.) Decrease Hydralazine to 25 mg as needed If the top number of your blood pressure is greater than 160   *If you need a refill on your cardiac medications before your next appointment, please call your pharmacy*  Lab Work: None ordered   If you have labs (blood work) drawn today and your tests are completely normal, you will receive your results only by: Marland Kitchen MyChart Message (if you have MyChart) OR . A paper copy in the mail If you have any lab test that is abnormal or we need to change your treatment, we will call you to review the results.  Testing/Procedures: None ordered   Follow-Up: You are scheduled for a virtual with Dr. Sallyanne Kuster on 07/31/2019 @ 11:00 AM , Someone will call you 10-15 mins prior to your appointment to go over you medications and any vitals you are able to obtain (Blood pressure, Heart rate, Weight etc). If you are scheduled for a telephone call the provider will call you from our office number, the number may show up as *unknown*. If you are scheduled for a video visit, your provider will send a text message to your phone that contains a link that you will click on, Once you click on the link, answer the questions and then you will be directed to a waiting room and your provider will enter.   Other Instructions Weigh yourself daily, use Lasix as needed and call us if your lower extremity swelling does not stop while taking the Lasix

## 2019-05-15 NOTE — Progress Notes (Signed)
Thanks, Callie. I think you spent some time there, didn't you? Agree with your recommendations.

## 2019-05-26 ENCOUNTER — Other Ambulatory Visit: Payer: Medicare Other

## 2019-06-03 ENCOUNTER — Other Ambulatory Visit: Payer: Self-pay

## 2019-06-03 MED ORDER — CLOPIDOGREL BISULFATE 75 MG PO TABS: 75.0000 mg | ORAL_TABLET | Freq: Every day | ORAL | 3 refills | Status: DC

## 2019-06-22 ENCOUNTER — Ambulatory Visit
Admission: RE | Admit: 2019-06-22 | Discharge: 2019-06-22 | Disposition: A | Discharge status: Home / Self Care | Payer: Medicare Other | Source: Ambulatory Visit | Attending: Internal Medicine | Admitting: Internal Medicine

## 2019-06-22 ENCOUNTER — Other Ambulatory Visit: Payer: Self-pay

## 2019-06-22 DIAGNOSIS — R42 Dizziness and giddiness: Secondary | ICD-10-CM

## 2019-06-22 MED ORDER — GADOBENATE DIMEGLUMINE 529 MG/ML IV SOLN: 20.0000 mL | Freq: Once | INTRAVENOUS | Status: DC | PRN

## 2019-06-22 MED ORDER — GADOBENATE DIMEGLUMINE 529 MG/ML IV SOLN
10.0000 mL | Freq: Once | INTRAVENOUS | Status: AC | PRN
  Administered 2019-06-22: 10 mL via INTRAVENOUS

## 2019-06-24 ENCOUNTER — Ambulatory Visit: Payer: Medicare Other | Admitting: Diagnostic Neuroimaging

## 2019-06-24 ENCOUNTER — Encounter: Payer: Self-pay | Admitting: Diagnostic Neuroimaging

## 2019-06-24 ENCOUNTER — Other Ambulatory Visit: Payer: Self-pay

## 2019-06-24 VITALS — BP 164/79 | HR 63 | Temp 97.5°F | Ht 62.0 in | Wt 214.0 lb

## 2019-06-24 DIAGNOSIS — R269 Unspecified abnormalities of gait and mobility: Secondary | ICD-10-CM | POA: Diagnosis not present

## 2019-06-24 DIAGNOSIS — F488 Other specified nonpsychotic mental disorders: Secondary | ICD-10-CM | POA: Diagnosis not present

## 2019-06-24 DIAGNOSIS — R4189 Other symptoms and signs involving cognitive functions and awareness: Secondary | ICD-10-CM
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
## 2019-06-24 NOTE — Patient Instructions (Signed)
BALANCE / GAIT DIFF / FOGGY SENSATION / SHORTNESS OF BREATH - multi-factorial; chronic small vessel ischemic disease, chronic medical disease (heart, diabetes, kidney, anemia), deconditioning - continue PT exercises

## 2019-07-31 ENCOUNTER — Telehealth: Payer: Medicare Other | Admitting: Cardiovascular Disease

## 2019-11-21 ENCOUNTER — Emergency Department (HOSPITAL_COMMUNITY): Payer: Medicare Other

## 2019-11-21 ENCOUNTER — Inpatient Hospital Stay (HOSPITAL_COMMUNITY)
Admission: EM | Admit: 2019-11-21 | Discharge: 2019-11-28 | DRG: 291 | Disposition: A | Discharge status: Home Health | Payer: Medicare Other | Attending: Cardiology | Admitting: Cardiology

## 2019-11-21 ENCOUNTER — Encounter (HOSPITAL_COMMUNITY): Payer: Self-pay | Admitting: Emergency Medicine

## 2019-11-21 ENCOUNTER — Other Ambulatory Visit: Payer: Self-pay

## 2019-11-21 DIAGNOSIS — Z953 Presence of xenogenic heart valve: Secondary | ICD-10-CM | POA: Diagnosis not present

## 2019-11-21 DIAGNOSIS — I2510 Atherosclerotic heart disease of native coronary artery without angina pectoris: Secondary | ICD-10-CM | POA: Diagnosis present

## 2019-11-21 DIAGNOSIS — Z955 Presence of coronary angioplasty implant and graft: Secondary | ICD-10-CM

## 2019-11-21 DIAGNOSIS — Z79899 Other long term (current) drug therapy: Secondary | ICD-10-CM

## 2019-11-21 DIAGNOSIS — I5043 Acute on chronic combined systolic (congestive) and diastolic (congestive) heart failure: Secondary | ICD-10-CM | POA: Diagnosis present

## 2019-11-21 DIAGNOSIS — M109 Gout, unspecified: Secondary | ICD-10-CM | POA: Diagnosis present

## 2019-11-21 DIAGNOSIS — R0902 Hypoxemia: Secondary | ICD-10-CM

## 2019-11-21 DIAGNOSIS — Z7902 Long term (current) use of antithrombotics/antiplatelets: Secondary | ICD-10-CM | POA: Diagnosis not present

## 2019-11-21 DIAGNOSIS — I340 Nonrheumatic mitral (valve) insufficiency: Secondary | ICD-10-CM | POA: Diagnosis not present

## 2019-11-21 DIAGNOSIS — Z20822 Contact with and (suspected) exposure to covid-19: Secondary | ICD-10-CM | POA: Diagnosis present

## 2019-11-21 DIAGNOSIS — J9691 Respiratory failure, unspecified with hypoxia: Secondary | ICD-10-CM | POA: Diagnosis present

## 2019-11-21 DIAGNOSIS — E785 Hyperlipidemia, unspecified: Secondary | ICD-10-CM | POA: Diagnosis present

## 2019-11-21 DIAGNOSIS — E669 Obesity, unspecified: Secondary | ICD-10-CM | POA: Diagnosis present

## 2019-11-21 DIAGNOSIS — I5030 Unspecified diastolic (congestive) heart failure: Secondary | ICD-10-CM

## 2019-11-21 DIAGNOSIS — I272 Pulmonary hypertension, unspecified: Secondary | ICD-10-CM | POA: Diagnosis present

## 2019-11-21 DIAGNOSIS — N183 Chronic kidney disease, stage 3 unspecified: Secondary | ICD-10-CM | POA: Diagnosis present

## 2019-11-21 DIAGNOSIS — Z952 Presence of prosthetic heart valve: Secondary | ICD-10-CM | POA: Diagnosis not present

## 2019-11-21 DIAGNOSIS — I10 Essential (primary) hypertension: Secondary | ICD-10-CM | POA: Diagnosis present

## 2019-11-21 DIAGNOSIS — E1122 Type 2 diabetes mellitus with diabetic chronic kidney disease: Secondary | ICD-10-CM | POA: Diagnosis present

## 2019-11-21 DIAGNOSIS — I4819 Other persistent atrial fibrillation: Secondary | ICD-10-CM | POA: Diagnosis present

## 2019-11-21 DIAGNOSIS — I361 Nonrheumatic tricuspid (valve) insufficiency: Secondary | ICD-10-CM | POA: Diagnosis not present

## 2019-11-21 DIAGNOSIS — R0600 Dyspnea, unspecified: Secondary | ICD-10-CM

## 2019-11-21 DIAGNOSIS — N1832 Chronic kidney disease, stage 3b: Secondary | ICD-10-CM | POA: Diagnosis present

## 2019-11-21 DIAGNOSIS — N2889 Other specified disorders of kidney and ureter: Secondary | ICD-10-CM | POA: Diagnosis present

## 2019-11-21 DIAGNOSIS — I130 Hypertensive heart and chronic kidney disease with heart failure and stage 1 through stage 4 chronic kidney disease, or unspecified chronic kidney disease: Secondary | ICD-10-CM | POA: Diagnosis present

## 2019-11-21 DIAGNOSIS — E1169 Type 2 diabetes mellitus with other specified complication: Secondary | ICD-10-CM | POA: Diagnosis not present

## 2019-11-21 DIAGNOSIS — I5033 Acute on chronic diastolic (congestive) heart failure: Secondary | ICD-10-CM | POA: Diagnosis not present

## 2019-11-21 DIAGNOSIS — D539 Nutritional anemia, unspecified: Secondary | ICD-10-CM | POA: Diagnosis present

## 2019-11-21 DIAGNOSIS — Z8739 Personal history of other diseases of the musculoskeletal system and connective tissue: Secondary | ICD-10-CM | POA: Diagnosis not present

## 2019-11-21 DIAGNOSIS — R0602 Shortness of breath: Secondary | ICD-10-CM | POA: Diagnosis present

## 2019-11-21 DIAGNOSIS — R040 Epistaxis: Secondary | ICD-10-CM | POA: Diagnosis not present

## 2019-11-21 DIAGNOSIS — I4891 Unspecified atrial fibrillation: Secondary | ICD-10-CM | POA: Diagnosis not present

## 2019-11-21 DIAGNOSIS — Z7982 Long term (current) use of aspirin: Secondary | ICD-10-CM | POA: Diagnosis not present

## 2019-11-21 DIAGNOSIS — Z853 Personal history of malignant neoplasm of breast: Secondary | ICD-10-CM | POA: Diagnosis not present

## 2019-11-21 HISTORY — DX: Unspecified systolic (congestive) heart failure: I50.20

## 2019-11-21 LAB — CBC
HCT: 34.1 % — ABNORMAL LOW (ref 36.0–46.0)
Hemoglobin: 10.8 g/dL — ABNORMAL LOW (ref 12.0–15.0)
MCH: 31.8 pg (ref 26.0–34.0)
MCHC: 31.7 g/dL (ref 30.0–36.0)
MCV: 100.3 fL — ABNORMAL HIGH (ref 80.0–100.0)
Platelets: 157 10*3/uL (ref 150–400)
RBC: 3.4 MIL/uL — ABNORMAL LOW (ref 3.87–5.11)
RDW: 13.9 % (ref 11.5–15.5)
WBC: 6.7 10*3/uL (ref 4.0–10.5)
nRBC: 0 % (ref 0.0–0.2)

## 2019-11-21 LAB — BASIC METABOLIC PANEL
Anion gap: 11 (ref 5–15)
BUN: 47 mg/dL — ABNORMAL HIGH (ref 8–23)
CO2: 23 mmol/L (ref 22–32)
Calcium: 9.4 mg/dL (ref 8.9–10.3)
Chloride: 106 mmol/L (ref 98–111)
Creatinine, Ser: 1.67 mg/dL — ABNORMAL HIGH (ref 0.44–1.00)
GFR calc Af Amer: 33 mL/min — ABNORMAL LOW (ref >60)
GFR calc non Af Amer: 28 mL/min — ABNORMAL LOW (ref >60)
Glucose, Bld: 176 mg/dL — ABNORMAL HIGH (ref 70–99)
Potassium: 4 mmol/L (ref 3.5–5.1)
Sodium: 140 mmol/L (ref 135–145)

## 2019-11-21 LAB — HEPATIC FUNCTION PANEL
ALT: 15 U/L (ref 0–44)
AST: 17 U/L (ref 15–41)
Albumin: 3.5 g/dL (ref 3.5–5.0)
Alkaline Phosphatase: 89 U/L (ref 38–126)
Bilirubin, Direct: 0.3 mg/dL — ABNORMAL HIGH (ref 0.0–0.2)
Indirect Bilirubin: 0.6 mg/dL (ref 0.3–0.9)
Total Bilirubin: 0.9 mg/dL (ref 0.3–1.2)
Total Protein: 6.4 g/dL — ABNORMAL LOW (ref 6.5–8.1)

## 2019-11-21 LAB — BRAIN NATRIURETIC PEPTIDE: B Natriuretic Peptide: 278.1 pg/mL — ABNORMAL HIGH (ref 0.0–100.0)

## 2019-11-21 LAB — HEMOGLOBIN A1C
Hgb A1c MFr Bld: 5.4 % (ref 4.8–5.6)
Mean Plasma Glucose: 108.28 mg/dL

## 2019-11-21 LAB — SARS CORONAVIRUS 2 BY RT PCR (HOSPITAL ORDER, PERFORMED IN ~~LOC~~ HOSPITAL LAB): SARS Coronavirus 2: NEGATIVE

## 2019-11-21 LAB — TSH: TSH: 1.983 u[IU]/mL (ref 0.350–4.500)

## 2019-11-21 LAB — MAGNESIUM: Magnesium: 2.1 mg/dL (ref 1.7–2.4)

## 2019-11-21 MED ORDER — APIXABAN 2.5 MG PO TABS: 2.5000 mg | ORAL_TABLET | Freq: Two times a day (BID) | ORAL | Status: DC

## 2019-11-21 MED ORDER — SODIUM CHLORIDE 0.9% FLUSH: 3.0000 mL | Freq: Once | INTRAVENOUS | Status: DC

## 2019-11-21 MED ORDER — SODIUM CHLORIDE 0.9% FLUSH: 3.0000 mL | INTRAVENOUS | Status: DC | PRN

## 2019-11-21 MED ORDER — DILTIAZEM HCL 25 MG/5ML IV SOLN: 15.0000 mg | Freq: Once | INTRAVENOUS | Status: DC

## 2019-11-21 MED ORDER — APIXABAN 2.5 MG PO TABS
2.5000 mg | ORAL_TABLET | Freq: Two times a day (BID) | ORAL | Status: DC
  Administered 2019-11-21 – 2019-11-28 (×15): 2.5 mg via ORAL
  Filled 2019-11-21 (×17): qty 1

## 2019-11-21 MED ORDER — HYDRALAZINE HCL 25 MG PO TABS: 25.0000 mg | ORAL_TABLET | ORAL | Status: DC

## 2019-11-21 MED ORDER — ZOLPIDEM TARTRATE 5 MG PO TABS
5.0000 mg | ORAL_TABLET | Freq: Every evening | ORAL | Status: DC | PRN
  Filled 2019-11-21: qty 1

## 2019-11-21 MED ORDER — SODIUM CHLORIDE 0.9% FLUSH
3.0000 mL | Freq: Two times a day (BID) | INTRAVENOUS | Status: DC
  Administered 2019-11-22 – 2019-11-28 (×12): 3 mL via INTRAVENOUS

## 2019-11-21 MED ORDER — FUROSEMIDE 10 MG/ML IJ SOLN
60.0000 mg | Freq: Two times a day (BID) | INTRAMUSCULAR | Status: DC
  Administered 2019-11-21 – 2019-11-24 (×7): 60 mg via INTRAVENOUS
  Filled 2019-11-21 (×6): qty 6

## 2019-11-21 MED ORDER — COLCHICINE 0.6 MG PO TABS: 0.6000 mg | ORAL_TABLET | Freq: Every day | ORAL | Status: DC | PRN

## 2019-11-21 MED ORDER — ROSUVASTATIN CALCIUM 5 MG PO TABS
10.0000 mg | ORAL_TABLET | Freq: Every day | ORAL | Status: DC
  Administered 2019-11-21 – 2019-11-28 (×8): 10 mg via ORAL
  Filled 2019-11-21 (×8): qty 2

## 2019-11-21 MED ORDER — DILTIAZEM HCL-DEXTROSE 125-5 MG/125ML-% IV SOLN (PREMIX)
5.0000 mg/h | INTRAVENOUS | Status: DC
  Administered 2019-11-21 – 2019-11-22 (×3): 10 mg/h via INTRAVENOUS
  Administered 2019-11-23: 5 mg/h via INTRAVENOUS
  Administered 2019-11-23: 10 mg/h via INTRAVENOUS
  Filled 2019-11-21 (×5): qty 125

## 2019-11-21 MED ORDER — DILTIAZEM HCL-DEXTROSE 125-5 MG/125ML-% IV SOLN (PREMIX)
5.0000 mg/h | INTRAVENOUS | Status: DC
  Administered 2019-11-21: 5 mg/h via INTRAVENOUS
  Filled 2019-11-21: qty 125

## 2019-11-21 MED ORDER — ACETAMINOPHEN 325 MG PO TABS: 650.0000 mg | ORAL_TABLET | ORAL | Status: DC | PRN

## 2019-11-21 MED ORDER — ALPRAZOLAM 0.25 MG PO TABS
0.2500 mg | ORAL_TABLET | Freq: Two times a day (BID) | ORAL | Status: DC | PRN
  Filled 2019-11-21: qty 1

## 2019-11-21 MED ORDER — DILTIAZEM LOAD VIA INFUSION
10.0000 mg | Freq: Once | INTRAVENOUS | Status: AC
  Administered 2019-11-21: 10 mg via INTRAVENOUS
  Filled 2019-11-21: qty 10

## 2019-11-21 MED ORDER — ONDANSETRON HCL 4 MG/2ML IJ SOLN: 4.0000 mg | Freq: Four times a day (QID) | INTRAMUSCULAR | Status: DC | PRN

## 2019-11-21 MED ORDER — SODIUM CHLORIDE 0.9 % IV SOLN
250.0000 mL | INTRAVENOUS | Status: DC | PRN
  Administered 2019-11-28: 250 mL via INTRAVENOUS

## 2019-11-21 MED ORDER — CARVEDILOL 3.125 MG PO TABS: 6.2500 mg | ORAL_TABLET | Freq: Two times a day (BID) | ORAL | Status: DC

## 2019-11-21 NOTE — ED Triage Notes (Signed)
Pt reports increased SOB x 2 days with 14 lb weight gain over the last 4 days.  Denies pain.
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
## 2019-11-21 NOTE — Discharge Instructions (Signed)

## 2019-11-22 ENCOUNTER — Inpatient Hospital Stay (HOSPITAL_COMMUNITY): Payer: Medicare Other

## 2019-11-22 DIAGNOSIS — Z8739 Personal history of other diseases of the musculoskeletal system and connective tissue: Secondary | ICD-10-CM

## 2019-11-22 DIAGNOSIS — I361 Nonrheumatic tricuspid (valve) insufficiency: Secondary | ICD-10-CM

## 2019-11-22 DIAGNOSIS — I340 Nonrheumatic mitral (valve) insufficiency: Secondary | ICD-10-CM

## 2019-11-22 LAB — CBC
HCT: 32.6 % — ABNORMAL LOW (ref 36.0–46.0)
Hemoglobin: 10.5 g/dL — ABNORMAL LOW (ref 12.0–15.0)
MCH: 32.4 pg (ref 26.0–34.0)
MCHC: 32.2 g/dL (ref 30.0–36.0)
MCV: 100.6 fL — ABNORMAL HIGH (ref 80.0–100.0)
Platelets: 155 10*3/uL (ref 150–400)
RBC: 3.24 MIL/uL — ABNORMAL LOW (ref 3.87–5.11)
RDW: 14 % (ref 11.5–15.5)
WBC: 6.7 10*3/uL (ref 4.0–10.5)
nRBC: 0 % (ref 0.0–0.2)

## 2019-11-22 LAB — BASIC METABOLIC PANEL
Anion gap: 8 (ref 5–15)
BUN: 46 mg/dL — ABNORMAL HIGH (ref 8–23)
CO2: 23 mmol/L (ref 22–32)
Calcium: 8.8 mg/dL — ABNORMAL LOW (ref 8.9–10.3)
Chloride: 107 mmol/L (ref 98–111)
Creatinine, Ser: 1.63 mg/dL — ABNORMAL HIGH (ref 0.44–1.00)
GFR calc Af Amer: 34 mL/min — ABNORMAL LOW (ref >60)
GFR calc non Af Amer: 29 mL/min — ABNORMAL LOW (ref >60)
Glucose, Bld: 148 mg/dL — ABNORMAL HIGH (ref 70–99)
Potassium: 3.5 mmol/L (ref 3.5–5.1)
Sodium: 138 mmol/L (ref 135–145)

## 2019-11-22 LAB — ECHOCARDIOGRAM COMPLETE
Height: 62 in
Weight: 3579.2 oz

## 2019-11-22 LAB — LIPID PANEL
Cholesterol: 98 mg/dL (ref 0–200)
HDL: 33 mg/dL — ABNORMAL LOW (ref >40)
LDL Cholesterol: 52 mg/dL (ref 0–99)
Total CHOL/HDL Ratio: 3 RATIO
Triglycerides: 67 mg/dL (ref <150)
VLDL: 13 mg/dL (ref 0–40)

## 2019-11-22 MED ORDER — HYDRALAZINE HCL 25 MG PO TABS: 25.0000 mg | ORAL_TABLET | Freq: Every day | ORAL | Status: DC | PRN

## 2019-11-22 MED ORDER — DOCUSATE SODIUM 100 MG PO CAPS
100.0000 mg | ORAL_CAPSULE | Freq: Two times a day (BID) | ORAL | Status: DC | PRN
  Administered 2019-11-22 – 2019-11-26 (×4): 100 mg via ORAL
  Filled 2019-11-22 (×5): qty 1

## 2019-11-22 MED ORDER — CARVEDILOL 6.25 MG PO TABS
6.2500 mg | ORAL_TABLET | Freq: Two times a day (BID) | ORAL | Status: DC
  Administered 2019-11-22 – 2019-11-24 (×4): 6.25 mg via ORAL
  Filled 2019-11-22 (×4): qty 1

## 2019-11-22 NOTE — Progress Notes (Signed)
*  PRELIMINARY RESULTS* Echocardiogram 2D Echocardiogram has been performed.  Leavy Cella 11/22/2019, 11:24 AM

## 2019-11-22 NOTE — Progress Notes (Addendum)
Progress Note  Patient Name: Carrie Monroe Date of Encounter: 11/22/2019  [facility] Cardiologist: Sanda Klein, MD   Subjective   Improved after diuresis, but still orthopneic, -1.5 L since arrival on the unit. Weight today is 223.7 pounds, weight at office appointments over the last few months has generally been around 215 pounds. Still has considerable bilateral calf edema which today looks very symmetrical. She denies chest discomfort. Adequate rate control on intravenous diltiazem. Echocardiogram is a little hard to interpret due to image quality, prominent left bundle branch block related dyssynchrony and irregular rhythm, but overall EF appears to be around 40%, may be slightly worse than on the previous study.  There are no regional wall motion abnormalities other than the dyssynchrony caused by the intraventricular conduction delay. The TAVR prosthesis has normal gradients and no insufficiency/perivalvular leak. Renal function is unchanged despite diuresis.  Potassium is borderline low at 3.5.  Inpatient Medications    Scheduled Meds:  apixaban  2.5 mg Oral BID   furosemide  60 mg Intravenous BID   rosuvastatin  10 mg Oral Daily   sodium chloride flush  3 mL Intravenous Once   sodium chloride flush  3 mL Intravenous Q12H   Continuous Infusions:  sodium chloride     diltiazem (CARDIZEM) infusion 10 mg/hr (11/22/19 0306)   PRN Meds: sodium chloride, acetaminophen, ALPRAZolam, colchicine, hydrALAZINE, ondansetron (ZOFRAN) IV, sodium chloride flush, zolpidem   Vital Signs    Vitals:   11/22/19 0039 11/22/19 0400 11/22/19 0731 11/22/19 1210  BP: 113/72 125/73 132/85 (!) 123/98  Pulse: 76 85 88 69  Resp: 17 19 18 20   Temp: 98.6 F (37 C) 98.2 F (36.8 C) 98.5 F (36.9 C) 98.4 F (36.9 C)  TempSrc: Oral Oral Oral Oral  SpO2: 90% 90% 96% 93%  Weight:  101.5 kg    Height:        Intake/Output Summary (Last 24 hours) at 11/22/2019 1539 Last  data filed at 11/22/2019 1458 Gross per 24 hour  Intake 552.98 ml  Output 2100 ml  Net -1547.02 ml   Last 3 Weights 11/22/2019 11/21/2019 06/24/2019  Weight (lbs) 223 lb 11.2 oz 227 lb 12.8 oz 214 lb  Weight (kg) 101.47 kg 103.329 kg 97.07 kg      Telemetry    Atrial fibrillation with variable block, ventricular rate mostly in the 80s- Personally Reviewed  ECG    Atrial fibrillation with rapid ventricular response, atypical left bundle branch block- Personally Reviewed  Physical Exam  Obese, mildly tachypneic GEN: No acute distress.   Neck:  10 cm JVD Cardiac:  Irregular, no murmurs, rubs, or gallops.  Respiratory:  Diminished breath sounds in both bases, no wet rales are heard GI: Soft, nontender, non-distended  MS:  Symmetrical pitting 3+ edema of ankles and pretibial areas; No deformity. Neuro:  Nonfocal  Psych: Normal affect   Labs    High Sensitivity Troponin:  No results for input(s): TROPONINIHS in the last 720 hours.    Chemistry Recent Labs  Lab 11/21/19 1230 11/21/19 2033 11/22/19 0325  NA 140  --  138  K 4.0  --  3.5  CL 106  --  107  CO2 23  --  23  GLUCOSE 176*  --  148*  BUN 47*  --  46*  CREATININE 1.67*  --  1.63*  CALCIUM 9.4  --  8.8*  PROT  --  6.4*  --   ALBUMIN  --  3.5  --  AST  --  17  --   ALT  --  15  --   ALKPHOS  --  89  --   BILITOT  --  0.9  --   GFRNONAA 28*  --  29*  GFRAA 33*  --  34*  ANIONGAP 11  --  8     Hematology Recent Labs  Lab 11/21/19 1230 11/22/19 0325  WBC 6.7 6.7  RBC 3.40* 3.24*  HGB 10.8* 10.5*  HCT 34.1* 32.6*  MCV 100.3* 100.6*  MCH 31.8 32.4  MCHC 31.7 32.2  RDW 13.9 14.0  PLT 157 155    BNP Recent Labs  Lab 11/21/19 2033  BNP 278.1*     DDimer No results for input(s): DDIMER in the last 168 hours.   Radiology    DG Chest 2 View  Result Date: 11/21/2019 CLINICAL DATA:  Increased shortness of breath over the past 2 days with a 14 lb weight gain over the last 4 days. EXAM: CHEST - 2  VIEW COMPARISON:  04/27/2019 FINDINGS: Stable enlarged cardiac silhouette and aortic valve stent. Stable mild prominence of the pulmonary vasculature and interstitial markings and mild diffuse peribronchial thickening. The lungs remain mildly hyperexpanded. Diffuse osteopenia and mild thoracic spine degenerative changes. Bilateral shoulder degenerative changes. IMPRESSION: Stable cardiomegaly, mild pulmonary vascular congestion and mild changes of COPD and chronic bronchitis. Electronically Signed   By: Claudie Revering M.D.   On: 11/21/2019 12:48   ECHOCARDIOGRAM COMPLETE  Result Date: 11/22/2019    ECHOCARDIOGRAM REPORT   Patient Name:   TRANISE Monroe Date of Exam: 11/22/2019 Medical Rec #:  [id number]           Height:       62.0 in Accession #:    [id number]          Weight:       223.7 lb Date of Birth:  1935-10-27           BSA:          2.005 m Patient Age:    84 years            BP:           132/85 mmHg Patient Gender: F                   HR:           88 bpm. Exam Location:  Inpatient Procedure: 2D Echo Indications:    Atrial Fibrillation 427.31 / I48.91  History:        Patient has prior history of Echocardiogram examinations, most                 recent 01/28/2019. CHF, CAD, Arrythmias:Atrial Fibrillation,                 Signs/Symptoms:Bacteremia; Risk Factors:Diabetes, Dyslipidemia,                 Hypertension and Non-Smoker. S/P TAVR (transcatheter aortic                 valve replacement, Hemorrhagic shock.                 Aortic Valve: 26 mm Medtronic CoreValve-EvolutR prosthetic,                 stented (TAVR) valve is present in the aortic position.                 Procedure Date: September  2018.  Sonographer:    Leavy Cella Referring Phys: [id number] [place]  1. Left ventricular ejection fraction, by estimation, is 40 to 45%. The left ventricle has mildly decreased function. The left ventricle demonstrates global hypokinesis. There is mild concentric left ventricular  hypertrophy. Left ventricular diastolic function could not be evaluated.  2. Right ventricular systolic function is mildly reduced. The right ventricular size is mildly enlarged. There is moderately elevated pulmonary artery systolic pressure. The estimated right ventricular systolic pressure is 26.7 mmHg.  3. Left atrial size was severely dilated.  4. Right atrial size was severely dilated.  5. The mitral valve is normal in structure. Mild to moderate mitral valve regurgitation.  6. The aortic valve has been repaired/replaced. Aortic valve regurgitation is not visualized. There is a 26 mm Medtronic CoreValve-EvolutR prosthetic (TAVR) valve present in the aortic position. Procedure Date: September 2018. Echo findings are consistent with normal structure and function of the aortic valve prosthesis.  7. The inferior vena cava is dilated in size with <50% respiratory variability, suggesting right atrial pressure of 15 mmHg. Comparison(s): Changes from prior study are noted. The left ventricular function is worsened. FINDINGS  Left Ventricle: Left ventricular ejection fraction, by estimation, is 40 to 45%. The left ventricle has mildly decreased function. The left ventricle demonstrates global hypokinesis. The left ventricular internal cavity size was normal in size. There is  mild concentric left ventricular hypertrophy. Abnormal (paradoxical) septal motion, consistent with left bundle branch block. Left ventricular diastolic function could not be evaluated due to atrial fibrillation. Left ventricular diastolic function could not be evaluated. Right Ventricle: The right ventricular size is mildly enlarged. Right vetricular wall thickness was not assessed. Right ventricular systolic function is mildly reduced. There is moderately elevated pulmonary artery systolic pressure. The tricuspid regurgitant velocity is 3.39 m/s, and with an assumed right atrial pressure of 15 mmHg, the estimated right ventricular systolic  pressure is 12.4 mmHg. Left Atrium: Left atrial size was severely dilated. Right Atrium: Right atrial size was severely dilated. Pericardium: There is no evidence of pericardial effusion. Mitral Valve: The mitral valve is normal in structure. There is mild thickening of the mitral valve leaflet(s). There is mild calcification of the mitral valve leaflet(s). Moderate mitral annular calcification. Mild to moderate mitral valve regurgitation, with centrally-directed jet. Tricuspid Valve: The tricuspid valve is normal in structure. Tricuspid valve regurgitation is mild. Aortic Valve: The aortic valve has been repaired/replaced. Aortic valve regurgitation is not visualized. Aortic valve mean gradient measures 3.0 mmHg. Aortic valve peak gradient measures 5.3 mmHg. Aortic valve area, by VTI measures 1.31 cm. There is a 26 mm Medtronic CoreValve-EvolutR prosthetic, stented (TAVR) valve present in the aortic position. Procedure Date: September 2018. Echo findings are consistent with normal structure and function of the aortic valve prosthesis. Pulmonic Valve: The pulmonic valve was grossly normal. Pulmonic valve regurgitation is mild. Aorta: The aortic root is normal in size and structure. Venous: The inferior vena cava is dilated in size with less than 50% respiratory variability, suggesting right atrial pressure of 15 mmHg. IAS/Shunts: No atrial level shunt detected by color flow Doppler.  LEFT VENTRICLE PLAX 2D LVIDd:         5.50 cm  Diastology LVIDs:         4.00 cm  LV e' lateral:   6.09 cm/s LV PW:         1.20 cm  LV E/e' lateral: 16.0 LV IVS:  1.30 cm  LV e' medial:    6.53 cm/s LVOT diam:     1.80 cm  LV E/e' medial:  15.0 LV SV:         28 LV SV Index:   14 LVOT Area:     2.54 cm  RIGHT VENTRICLE RV S prime:     10.80 cm/s TAPSE (M-mode): 1.2 cm LEFT ATRIUM             Index       RIGHT ATRIUM           Index LA diam:        4.10 cm 2.05 cm/m  RA Area:     25.40 cm LA Vol (A2C):   48.6 ml 24.24 ml/m RA  Volume:   95.90 ml  47.83 ml/m LA Vol (A4C):   71.3 ml 35.56 ml/m LA Biplane Vol: 62.6 ml 31.22 ml/m  AORTIC VALVE AV Area (Vmax):    1.45 cm AV Area (Vmean):   1.47 cm AV Area (VTI):     1.31 cm AV Vmax:           115.33 cm/s AV Vmean:          79.867 cm/s AV VTI:            0.212 m AV Peak Grad:      5.3 mmHg AV Mean Grad:      3.0 mmHg LVOT Vmax:         65.90 cm/s LVOT Vmean:        46.033 cm/s LVOT VTI:          0.109 m LVOT/AV VTI ratio: 0.51  AORTA Ao Root diam: 2.20 cm MITRAL VALVE                 TRICUSPID VALVE MV Area (PHT): 4.89 cm      TR Peak grad:   46.0 mmHg MV Decel Time: 155 msec      TR Vmax:        339.00 cm/s MR Peak grad:    90.6 mmHg MR Mean grad:    46.0 mmHg   SHUNTS MR Vmax:         476.00 cm/s Systemic VTI:  0.11 m MR Vmean:        305.0 cm/s  Systemic Diam: 1.80 cm MR PISA:         6.28 cm MR PISA Eff ROA: 42 mm MR PISA Radius:  1.00 cm MV E velocity: 97.70 cm/s MV A velocity: 25.70 cm/s MV E/A ratio:  3.80 Ranald Alessio MD Electronically signed by Sanda Klein MD Signature Date/Time: 11/22/2019/12:30:55 PM    Final     Cardiac Studies   Echocardiogram 11/22/2019 1. Left ventricular ejection fraction, by estimation, is 40 to 45%. The  left ventricle has mildly decreased function. The left ventricle  demonstrates global hypokinesis. There is mild concentric left ventricular  hypertrophy. Left ventricular diastolic  function could not be evaluated.  2. Right ventricular systolic function is mildly reduced. The right  ventricular size is mildly enlarged. There is moderately elevated  pulmonary artery systolic pressure. The estimated right ventricular  systolic pressure is 01.7 mmHg.  3. Left atrial size was severely dilated.  4. Right atrial size was severely dilated.  5. The mitral valve is normal in structure. Mild to moderate mitral valve  regurgitation.  6. The aortic valve has been repaired/replaced. Aortic valve  regurgitation is not visualized.  There is a 26  mm Medtronic  CoreValve-EvolutR prosthetic (TAVR) valve present in the aortic position.  Procedure Date: September 2018. Echo findings are  consistent with normal structure and function of the aortic valve  prosthesis.  7. The inferior vena cava is dilated in size with <50% respiratory  variability, suggesting right atrial pressure of 15 mmHg.   Patient Profile     84 y.o. female with CAD (DES to LAD and RCA in 2018), history of severe AS status post TAVR (Medtronic evolut September 2018), bilateral carotid artery disease status post right TCAR (October 2020), LBBB, chronic heart failure with mildly reduced left ventricular ejection fraction (EF 45%), essential hypertension, hyperlipidemia, type 2 diabetes mellitus, morbid obesity, presenting with new onset atrial fibrillation rapid ventricular response and heart failure exacerbation.  Assessment & Plan    1. AFib: She had palpitations (which she describes as throbbing in her back) that preceded the heart failure symptoms for several days.  She is now well rate controlled.  Will wean off diltiazem and start carvedilol. CHA2DS2-VASc 7 (age 42, gender, diabetes, heart failure, hypertension, CAD/PAD).  Started Eliquis with dose adjusted for age and renal dysfunction. If were able to easily compensate heart failure and controlled ventricular rate, would suggest cardioversion following 3 weeks of uninterrupted anticoagulation as an outpatient.  However if ventricular rate control is not adequate and/or heart failure cannot be compensated with medical therapy, recommend early TEE guided cardioversion. 2. CHF: Continues to have signs and symptoms of both left heart failure and right heart failure and needs additional diuresis.  Probably still roughly 8-9 pounds above "dry weight".  Left ventricular systolic function looks a little worse on today's echo, but this may be simply related to the rapid rate and worsened dyssynchrony from the IVCD.   On my exam the edema is quite symmetrical and not consistent with DVT.  She does not have any tenderness or discoloration or symptoms of pulmonary embolism.  She will be on anticoagulation for atrial fibrillation anyway.  We will cancel the lower extremity venous Dopplers. 3. CAD: With history of stents in LAD and RCA, asymptomatic despite rapid ventricular rates and heart failure exacerbation. 4. DM: Excellent control, hemoglobin A1c 5.4% now off medications. 5, HLP: With the exception of low HDL cholesterol, all other lipid parameters are well within target range.  HDL unlikely to improve without substantial weight loss. 6. CKD 3b-4: Renal function has been stable diuresis.  GFR appears to be right around 30. 7. HTN: Preferred use of beta-blockers for rate control/heart failure and RAAS inhibitors.   8. Gout: Note history of gout.  Watch for exacerbation with aggressive diuresis.  On colchicine.  Most recent uric acid level that I have available for review is from 2018. 9.  Mild macrocytic anemia: Unsure whether this has been evaluated with folic acid/vitamin F79 levels. 10.  Morbid obesity  For questions or updates, please contact [place] Please consult [email or web address] for contact info under        Signed, Sanda Klein, MD  11/22/2019, 3:39 PM

## 2019-11-23 NOTE — Progress Notes (Signed)
Patient states that she thought she was a DNR already. Explained to her that she has an order for full code. She is now thinking about being a partial code. Cardiology notified.

## 2019-11-23 NOTE — Consult Note (Signed)
Progress Note  Patient Name: Carrie Monroe Date of Encounter: 11/23/2019  [facility] Cardiologist: Sanda Klein, MD   Subjective   No chest pain, walked in hall and did have to stop but unsure why.  She still needs to sit up to sleep.     Inpatient Medications    Scheduled Meds: . apixaban  2.5 mg Oral BID  . carvedilol  6.25 mg Oral BID WC  . furosemide  60 mg Intravenous BID  . rosuvastatin  10 mg Oral Daily  . sodium chloride flush  3 mL Intravenous Once  . sodium chloride flush  3 mL Intravenous Q12H   Continuous Infusions: . sodium chloride    . diltiazem (CARDIZEM) infusion 10 mg/hr (11/23/19 0617)   PRN Meds: sodium chloride, acetaminophen, ALPRAZolam, colchicine, docusate sodium, hydrALAZINE, ondansetron (ZOFRAN) IV, sodium chloride flush, zolpidem   Vital Signs    Vitals:   11/23/19 0200 11/23/19 0457 11/23/19 0500 11/23/19 0600  BP: 133/78 (!) 131/93 136/66 139/86  Pulse:      Resp:      Temp:  98.6 F (37 C)    TempSrc:  Oral    SpO2:      Weight:  103.4 kg    Height:        Intake/Output Summary (Last 24 hours) at 11/23/2019 0914 Last data filed at 11/23/2019 0835 Gross per 24 hour  Intake 1022.71 ml  Output 2140 ml  Net -1117.29 ml   Last 3 Weights 11/23/2019 11/22/2019 11/21/2019  Weight (lbs) 227 lb 14.4 oz 223 lb 11.2 oz 227 lb 12.8 oz  Weight (kg) 103.375 kg 101.47 kg 103.329 kg      Telemetry    Atrial fib rate controlled  - Personally Reviewed  ECG    No new - Personally Reviewed  Physical Exam   GEN: No acute distress.   Neck: No JVD- sitting upright  Cardiac: irreg irreg, no murmurs, rubs, or gallops.  Respiratory: rales in bases to auscultation bilaterally. GI: Soft, nontender, non-distended  MS: 3+ edema; No deformity. To knees  Neuro:  Nonfocal  Psych: Normal affect   Labs    High Sensitivity Troponin:  No results for input(s): TROPONINIHS in the last 720 hours.    Chemistry Recent Labs  Lab  11/21/19 1230 11/21/19 2033 11/22/19 0325  NA 140  --  138  K 4.0  --  3.5  CL 106  --  107  CO2 23  --  23  GLUCOSE 176*  --  148*  BUN 47*  --  46*  CREATININE 1.67*  --  1.63*  CALCIUM 9.4  --  8.8*  PROT  --  6.4*  --   ALBUMIN  --  3.5  --   AST  --  17  --   ALT  --  15  --   ALKPHOS  --  89  --   BILITOT  --  0.9  --   GFRNONAA 28*  --  29*  GFRAA 33*  --  34*  ANIONGAP 11  --  8     Hematology Recent Labs  Lab 11/21/19 1230 11/22/19 0325  WBC 6.7 6.7  RBC 3.40* 3.24*  HGB 10.8* 10.5*  HCT 34.1* 32.6*  MCV 100.3* 100.6*  MCH 31.8 32.4  MCHC 31.7 32.2  RDW 13.9 14.0  PLT 157 155    BNP Recent Labs  Lab 11/21/19 2033  BNP 278.1*     DDimer No results for  input(s): DDIMER in the last 168 hours.   Radiology    DG Chest 2 View  Result Date: 11/21/2019 CLINICAL DATA:  Increased shortness of breath over the past 2 days with a 14 lb weight gain over the last 4 days. EXAM: CHEST - 2 VIEW COMPARISON:  04/27/2019 FINDINGS: Stable enlarged cardiac silhouette and aortic valve stent. Stable mild prominence of the pulmonary vasculature and interstitial markings and mild diffuse peribronchial thickening. The lungs remain mildly hyperexpanded. Diffuse osteopenia and mild thoracic spine degenerative changes. Bilateral shoulder degenerative changes. IMPRESSION: Stable cardiomegaly, mild pulmonary vascular congestion and mild changes of COPD and chronic bronchitis. Electronically Signed   By: Claudie Revering M.D.   On: 11/21/2019 12:48   ECHOCARDIOGRAM COMPLETE  Result Date: 11/22/2019    ECHOCARDIOGRAM REPORT   Patient Name:   Carrie Monroe Date of Exam: 11/22/2019 Medical Rec #:  [id number]           Height:       62.0 in Accession #:    [id number]          Weight:       223.7 lb Date of Birth:  November 10, 1935           BSA:          2.005 m Patient Age:    84 years            BP:           132/85 mmHg Patient Gender: F                   HR:           88 bpm. Exam Location:   Inpatient Procedure: 2D Echo Indications:    Atrial Fibrillation 427.31 / I48.91  History:        Patient has prior history of Echocardiogram examinations, most                 recent 01/28/2019. CHF, CAD, Arrythmias:Atrial Fibrillation,                 Signs/Symptoms:Bacteremia; Risk Factors:Diabetes, Dyslipidemia,                 Hypertension and Non-Smoker. S/P TAVR (transcatheter aortic                 valve replacement, Hemorrhagic shock.                 Aortic Valve: 26 mm Medtronic CoreValve-EvolutR prosthetic,                 stented (TAVR) valve is present in the aortic position.                 Procedure Date: September 2018.  Sonographer:    Leavy Cella Referring Phys: [id number] [place]  1. Left ventricular ejection fraction, by estimation, is 40 to 45%. The left ventricle has mildly decreased function. The left ventricle demonstrates global hypokinesis. There is mild concentric left ventricular hypertrophy. Left ventricular diastolic function could not be evaluated.  2. Right ventricular systolic function is mildly reduced. The right ventricular size is mildly enlarged. There is moderately elevated pulmonary artery systolic pressure. The estimated right ventricular systolic pressure is 38.1 mmHg.  3. Left atrial size was severely dilated.  4. Right atrial size was severely dilated.  5. The mitral valve is normal in structure. Mild to moderate mitral valve regurgitation.  6. The aortic valve has been  repaired/replaced. Aortic valve regurgitation is not visualized. There is a 26 mm Medtronic CoreValve-EvolutR prosthetic (TAVR) valve present in the aortic position. Procedure Date: September 2018. Echo findings are consistent with normal structure and function of the aortic valve prosthesis.  7. The inferior vena cava is dilated in size with <50% respiratory variability, suggesting right atrial pressure of 15 mmHg. Comparison(s): Changes from prior study are noted. The left ventricular  function is worsened. FINDINGS  Left Ventricle: Left ventricular ejection fraction, by estimation, is 40 to 45%. The left ventricle has mildly decreased function. The left ventricle demonstrates global hypokinesis. The left ventricular internal cavity size was normal in size. There is  mild concentric left ventricular hypertrophy. Abnormal (paradoxical) septal motion, consistent with left bundle branch block. Left ventricular diastolic function could not be evaluated due to atrial fibrillation. Left ventricular diastolic function could not be evaluated. Right Ventricle: The right ventricular size is mildly enlarged. Right vetricular wall thickness was not assessed. Right ventricular systolic function is mildly reduced. There is moderately elevated pulmonary artery systolic pressure. The tricuspid regurgitant velocity is 3.39 m/s, and with an assumed right atrial pressure of 15 mmHg, the estimated right ventricular systolic pressure is 26.3 mmHg. Left Atrium: Left atrial size was severely dilated. Right Atrium: Right atrial size was severely dilated. Pericardium: There is no evidence of pericardial effusion. Mitral Valve: The mitral valve is normal in structure. There is mild thickening of the mitral valve leaflet(s). There is mild calcification of the mitral valve leaflet(s). Moderate mitral annular calcification. Mild to moderate mitral valve regurgitation, with centrally-directed jet. Tricuspid Valve: The tricuspid valve is normal in structure. Tricuspid valve regurgitation is mild. Aortic Valve: The aortic valve has been repaired/replaced. Aortic valve regurgitation is not visualized. Aortic valve mean gradient measures 3.0 mmHg. Aortic valve peak gradient measures 5.3 mmHg. Aortic valve area, by VTI measures 1.31 cm. There is a 26 mm Medtronic CoreValve-EvolutR prosthetic, stented (TAVR) valve present in the aortic position. Procedure Date: September 2018. Echo findings are consistent with normal structure and  function of the aortic valve prosthesis. Pulmonic Valve: The pulmonic valve was grossly normal. Pulmonic valve regurgitation is mild. Aorta: The aortic root is normal in size and structure. Venous: The inferior vena cava is dilated in size with less than 50% respiratory variability, suggesting right atrial pressure of 15 mmHg. IAS/Shunts: No atrial level shunt detected by color flow Doppler.  LEFT VENTRICLE PLAX 2D LVIDd:         5.50 cm  Diastology LVIDs:         4.00 cm  LV e' lateral:   6.09 cm/s LV PW:         1.20 cm  LV E/e' lateral: 16.0 LV IVS:        1.30 cm  LV e' medial:    6.53 cm/s LVOT diam:     1.80 cm  LV E/e' medial:  15.0 LV SV:         28 LV SV Index:   14 LVOT Area:     2.54 cm  RIGHT VENTRICLE RV S prime:     10.80 cm/s TAPSE (M-mode): 1.2 cm LEFT ATRIUM             Index       RIGHT ATRIUM           Index LA diam:        4.10 cm 2.05 cm/m  RA Area:     25.40 cm LA Vol (A2C):  48.6 ml 24.24 ml/m RA Volume:   95.90 ml  47.83 ml/m LA Vol (A4C):   71.3 ml 35.56 ml/m LA Biplane Vol: 62.6 ml 31.22 ml/m  AORTIC VALVE AV Area (Vmax):    1.45 cm AV Area (Vmean):   1.47 cm AV Area (VTI):     1.31 cm AV Vmax:           115.33 cm/s AV Vmean:          79.867 cm/s AV VTI:            0.212 m AV Peak Grad:      5.3 mmHg AV Mean Grad:      3.0 mmHg LVOT Vmax:         65.90 cm/s LVOT Vmean:        46.033 cm/s LVOT VTI:          0.109 m LVOT/AV VTI ratio: 0.51  AORTA Ao Root diam: 2.20 cm MITRAL VALVE                 TRICUSPID VALVE MV Area (PHT): 4.89 cm      TR Peak grad:   46.0 mmHg MV Decel Time: 155 msec      TR Vmax:        339.00 cm/s MR Peak grad:    90.6 mmHg MR Mean grad:    46.0 mmHg   SHUNTS MR Vmax:         476.00 cm/s Systemic VTI:  0.11 m MR Vmean:        305.0 cm/s  Systemic Diam: 1.80 cm MR PISA:         6.28 cm MR PISA Eff ROA: 42 mm MR PISA Radius:  1.00 cm MV E velocity: 97.70 cm/s MV A velocity: 25.70 cm/s MV E/A ratio:  3.80 Mihai Croitoru MD Electronically signed by Sanda Klein MD Signature Date/Time: 11/22/2019/12:30:55 PM    Final     Cardiac Studies   Echocardiogram 11/22/2019 1. Left ventricular ejection fraction, by estimation, is 40 to 45%. The  left ventricle has mildly decreased function. The left ventricle  demonstrates global hypokinesis. There is mild concentric left ventricular  hypertrophy. Left ventricular diastolic  function could not be evaluated.  2. Right ventricular systolic function is mildly reduced. The right  ventricular size is mildly enlarged. There is moderately elevated  pulmonary artery systolic pressure. The estimated right ventricular  systolic pressure is 02.6 mmHg.  3. Left atrial size was severely dilated.  4. Right atrial size was severely dilated.  5. The mitral valve is normal in structure. Mild to moderate mitral valve  regurgitation.  6. The aortic valve has been repaired/replaced. Aortic valve  regurgitation is not visualized. There is a 26 mm Medtronic  CoreValve-EvolutR prosthetic (TAVR) valve present in the aortic position.  Procedure Date: September 2018. Echo findings are  consistent with normal structure and function of the aortic valve  prosthesis.  7. The inferior vena cava is dilated in size with <50% respiratory  variability, suggesting right atrial pressure of 15 mmHg.    Patient Profile     84 y.o. female with CAD (DES to LAD and RCA in 2018), history of severe AS status post TAVR (Medtronic evolut September 2018), bilateral carotid artery disease status post right TCAR (October 2020), LBBB, chronic heart failure with mildly reduced left ventricular ejection fraction (EF 45%), essential hypertension, hyperlipidemia, type 2 diabetes mellitus, morbid obesity, presenting with new onset atrial fibrillation rapid ventricular response  and heart failure exacerbation.  Assessment & Plan    1. AFib: She had palpitations (which she describes as throbbing in her back) that preceded the heart failure  symptoms for several days.  She is now well rate controlled.  Will wean off diltiazem and start carvedilol. CHA2DS2-VASc 7 (age 37, gender, diabetes, heart failure, hypertension, CAD/PAD).  Started Eliquis with dose adjusted for age and renal dysfunction. If were able to easily compensate heart failure and controlled ventricular rate, would suggest cardioversion following 3 weeks of uninterrupted anticoagulation as an outpatient.  However if ventricular rate control is not adequate and/or heart failure cannot be compensated with medical therapy, recommend early TEE guided cardioversion.  2. CHF: mild systolic and prob diastolic--Continues to have signs and symptoms of both left heart failure and right heart failure and needs additional diuresis.  Probably still roughly 8-9 pounds above "dry weight".  Left ventricular systolic function looks a little worse on today's echo, but this may be simply related to the rapid rate and worsened dyssynchrony from the IVCD.  (on coreg 6.25 BID, lasix)  --today neg 2,044 since admit.  Wt above admit wt if accurate despite diuresis.   --on lasix 60 mg IV BID --Cr 1.63 down from 1.67 yesterday  --K+ 3.5 3. CAD: With history of stents in LAD and RCA, asymptomatic despite rapid ventricular rates and heart failure exacerbation. 4. DM: Excellent control, hemoglobin A1c 5.4% now off medications. 5, HLP: With the exception of low HDL cholesterol, all other lipid parameters are well within target range.  HDL unlikely to improve without substantial weight loss. 6. CKD 3b-4: Renal function has been stable diuresis.  GFR appears to be right around 30. 7. HTN: Preferred use of beta-blockers for rate control/heart failure and RAAS inhibitors.   8. Gout: Note history of gout.  Watch for exacerbation with aggressive diuresis.  On colchicine.  Most recent uric acid level that I have available for review is from 2018. 9.  Mild macrocytic anemia: Unsure whether this has been evaluated  with folic acid/vitamin U31 levels. 10.  Morbid obesity 11. Hx of TAVR and valve is stable with prosthetic AV 12.  Mild to mod MR.     For questions or updates, please contact [place] Please consult [email or web address] for contact info under        Signed, Cecilie Kicks, NP  11/23/2019, 9:14 AM

## 2019-11-23 NOTE — Progress Notes (Signed)
   Called to discuss CODE STATUS with the patient.  She reports that she has an out of hospital DNR and living will.  She does not want to be on long-term ventilation or "life support" if she were to have a terminal event with little chance of recovery.  We discussed options in the hospital and she now understands this is distinctly different if she were to have an acute event such as cardiac arrest.  She does wish resuscitation as she has been resuscitated before related to procedures or incidental events but would not like prolonged support if she had no chance of meaningful recovery.  CODE STATUS will remain full.  Pixie Casino, MD, [facility], [place] Director of the [facility]  [facility] Diplomate of the American Board of Clinical Lipidology Attending Cardiologist  Direct Dial: [phone]  Fax: [phone]  Website:  [email or web address]

## 2019-11-24 LAB — BASIC METABOLIC PANEL
Anion gap: 10 (ref 5–15)
BUN: 50 mg/dL — ABNORMAL HIGH (ref 8–23)
CO2: 26 mmol/L (ref 22–32)
Calcium: 9.1 mg/dL (ref 8.9–10.3)
Chloride: 102 mmol/L (ref 98–111)
Creatinine, Ser: 1.78 mg/dL — ABNORMAL HIGH (ref 0.44–1.00)
GFR calc Af Amer: 30 mL/min — ABNORMAL LOW (ref >60)
GFR calc non Af Amer: 26 mL/min — ABNORMAL LOW (ref >60)
Glucose, Bld: 137 mg/dL — ABNORMAL HIGH (ref 70–99)
Potassium: 3.3 mmol/L — ABNORMAL LOW (ref 3.5–5.1)
Sodium: 138 mmol/L (ref 135–145)

## 2019-11-24 MED ORDER — FUROSEMIDE 40 MG PO TABS
60.0000 mg | ORAL_TABLET | Freq: Every day | ORAL | Status: DC
  Administered 2019-11-25: 60 mg via ORAL
  Filled 2019-11-24: qty 1

## 2019-11-24 MED ORDER — CARVEDILOL 12.5 MG PO TABS
12.5000 mg | ORAL_TABLET | Freq: Two times a day (BID) | ORAL | Status: DC
  Administered 2019-11-24 – 2019-11-25 (×3): 12.5 mg via ORAL
  Filled 2019-11-24 (×3): qty 1

## 2019-11-24 MED ORDER — POTASSIUM CHLORIDE CRYS ER 20 MEQ PO TBCR
40.0000 meq | EXTENDED_RELEASE_TABLET | Freq: Once | ORAL | Status: AC
  Administered 2019-11-24: 40 meq via ORAL
  Filled 2019-11-24: qty 2

## 2019-11-24 NOTE — Progress Notes (Signed)
Progress Note  Patient Name: Carrie Monroe Date of Encounter: 11/24/2019  [facility] Cardiologist: Sanda Klein, MD   Subjective   No chest pain, could lie back further last PM - resting comfortably in chair   Inpatient Medications    Scheduled Meds: . apixaban  2.5 mg Oral BID  . carvedilol  6.25 mg Oral BID WC  . furosemide  60 mg Intravenous BID  . rosuvastatin  10 mg Oral Daily  . sodium chloride flush  3 mL Intravenous Once  . sodium chloride flush  3 mL Intravenous Q12H   Continuous Infusions: . sodium chloride    . diltiazem (CARDIZEM) infusion 5 mg/hr (11/23/19 2254)   PRN Meds: sodium chloride, acetaminophen, ALPRAZolam, colchicine, docusate sodium, hydrALAZINE, ondansetron (ZOFRAN) IV, sodium chloride flush, zolpidem   Vital Signs    Vitals:   11/23/19 1604 11/23/19 1942 11/24/19 0444 11/24/19 0600  BP: 129/81 127/71 130/80   Pulse:  66 67 75  Resp: 20 17 (!) 21 18  Temp:  97.9 F (36.6 C) 99.2 F (37.3 C)   TempSrc:  Oral    SpO2: 92%  92% 91%  Weight:   104.3 kg   Height:        Intake/Output Summary (Last 24 hours) at 11/24/2019 0851 Last data filed at 11/24/2019 0434 Gross per 24 hour  Intake 529.67 ml  Output 1650 ml  Net -1120.33 ml   Last 3 Weights 11/24/2019 11/23/2019 11/22/2019  Weight (lbs) 230 lb 227 lb 14.4 oz 223 lb 11.2 oz  Weight (kg) 104.327 kg 103.375 kg 101.47 kg      Telemetry    A fib mostly rate controlled - Personally Reviewed  ECG    No new - Personally Reviewed  Physical Exam   GEN: No acute distress.   Neck: No JVD Cardiac: irreg irreg, no murmurs, rubs, or gallops.  Respiratory: Clear to auscultation bilaterally. GI: Soft, nontender, non-distended  MS: 1+ lower ext edema; No deformity. Neuro:  Nonfocal  Psych: Normal affect   Labs    High Sensitivity Troponin:  No results for input(s): TROPONINIHS in the last 720 hours.    Chemistry Recent Labs  Lab 11/21/19 1230 11/21/19 2033  11/22/19 0325 11/24/19 0656  NA 140  --  138 138  K 4.0  --  3.5 3.3*  CL 106  --  107 102  CO2 23  --  23 26  GLUCOSE 176*  --  148* 137*  BUN 47*  --  46* 50*  CREATININE 1.67*  --  1.63* 1.78*  CALCIUM 9.4  --  8.8* 9.1  PROT  --  6.4*  --   --   ALBUMIN  --  3.5  --   --   AST  --  17  --   --   ALT  --  15  --   --   ALKPHOS  --  89  --   --   BILITOT  --  0.9  --   --   GFRNONAA 28*  --  29* 26*  GFRAA 33*  --  34* 30*  ANIONGAP 11  --  8 10     Hematology Recent Labs  Lab 11/21/19 1230 11/22/19 0325  WBC 6.7 6.7  RBC 3.40* 3.24*  HGB 10.8* 10.5*  HCT 34.1* 32.6*  MCV 100.3* 100.6*  MCH 31.8 32.4  MCHC 31.7 32.2  RDW 13.9 14.0  PLT 157 155    BNP Recent Labs  Lab 11/21/19 2033  BNP 278.1*     DDimer No results for input(s): DDIMER in the last 168 hours.   Radiology    ECHOCARDIOGRAM COMPLETE  Result Date: 11/22/2019    ECHOCARDIOGRAM REPORT   Patient Name:   AMORIA MCLEES Date of Exam: 11/22/2019 Medical Rec #:  [id number]           Height:       62.0 in Accession #:    [id number]          Weight:       223.7 lb Date of Birth:  10-Sep-1935           BSA:          2.005 m Patient Age:    84 years            BP:           132/85 mmHg Patient Gender: F                   HR:           88 bpm. Exam Location:  Inpatient Procedure: 2D Echo Indications:    Atrial Fibrillation 427.31 / I48.91  History:        Patient has prior history of Echocardiogram examinations, most                 recent 01/28/2019. CHF, CAD, Arrythmias:Atrial Fibrillation,                 Signs/Symptoms:Bacteremia; Risk Factors:Diabetes, Dyslipidemia,                 Hypertension and Non-Smoker. S/P TAVR (transcatheter aortic                 valve replacement, Hemorrhagic shock.                 Aortic Valve: 26 mm Medtronic CoreValve-EvolutR prosthetic,                 stented (TAVR) valve is present in the aortic position.                 Procedure Date: September 2018.  Sonographer:     Leavy Cella Referring Phys: [id number] [place]  1. Left ventricular ejection fraction, by estimation, is 40 to 45%. The left ventricle has mildly decreased function. The left ventricle demonstrates global hypokinesis. There is mild concentric left ventricular hypertrophy. Left ventricular diastolic function could not be evaluated.  2. Right ventricular systolic function is mildly reduced. The right ventricular size is mildly enlarged. There is moderately elevated pulmonary artery systolic pressure. The estimated right ventricular systolic pressure is 78.4 mmHg.  3. Left atrial size was severely dilated.  4. Right atrial size was severely dilated.  5. The mitral valve is normal in structure. Mild to moderate mitral valve regurgitation.  6. The aortic valve has been repaired/replaced. Aortic valve regurgitation is not visualized. There is a 26 mm Medtronic CoreValve-EvolutR prosthetic (TAVR) valve present in the aortic position. Procedure Date: September 2018. Echo findings are consistent with normal structure and function of the aortic valve prosthesis.  7. The inferior vena cava is dilated in size with <50% respiratory variability, suggesting right atrial pressure of 15 mmHg. Comparison(s): Changes from prior study are noted. The left ventricular function is worsened. FINDINGS  Left Ventricle: Left ventricular ejection fraction, by estimation, is 40 to 45%. The left ventricle has mildly decreased function. The left  ventricle demonstrates global hypokinesis. The left ventricular internal cavity size was normal in size. There is  mild concentric left ventricular hypertrophy. Abnormal (paradoxical) septal motion, consistent with left bundle branch block. Left ventricular diastolic function could not be evaluated due to atrial fibrillation. Left ventricular diastolic function could not be evaluated. Right Ventricle: The right ventricular size is mildly enlarged. Right vetricular wall thickness was  not assessed. Right ventricular systolic function is mildly reduced. There is moderately elevated pulmonary artery systolic pressure. The tricuspid regurgitant velocity is 3.39 m/s, and with an assumed right atrial pressure of 15 mmHg, the estimated right ventricular systolic pressure is 19.3 mmHg. Left Atrium: Left atrial size was severely dilated. Right Atrium: Right atrial size was severely dilated. Pericardium: There is no evidence of pericardial effusion. Mitral Valve: The mitral valve is normal in structure. There is mild thickening of the mitral valve leaflet(s). There is mild calcification of the mitral valve leaflet(s). Moderate mitral annular calcification. Mild to moderate mitral valve regurgitation, with centrally-directed jet. Tricuspid Valve: The tricuspid valve is normal in structure. Tricuspid valve regurgitation is mild. Aortic Valve: The aortic valve has been repaired/replaced. Aortic valve regurgitation is not visualized. Aortic valve mean gradient measures 3.0 mmHg. Aortic valve peak gradient measures 5.3 mmHg. Aortic valve area, by VTI measures 1.31 cm. There is a 26 mm Medtronic CoreValve-EvolutR prosthetic, stented (TAVR) valve present in the aortic position. Procedure Date: September 2018. Echo findings are consistent with normal structure and function of the aortic valve prosthesis. Pulmonic Valve: The pulmonic valve was grossly normal. Pulmonic valve regurgitation is mild. Aorta: The aortic root is normal in size and structure. Venous: The inferior vena cava is dilated in size with less than 50% respiratory variability, suggesting right atrial pressure of 15 mmHg. IAS/Shunts: No atrial level shunt detected by color flow Doppler.  LEFT VENTRICLE PLAX 2D LVIDd:         5.50 cm  Diastology LVIDs:         4.00 cm  LV e' lateral:   6.09 cm/s LV PW:         1.20 cm  LV E/e' lateral: 16.0 LV IVS:        1.30 cm  LV e' medial:    6.53 cm/s LVOT diam:     1.80 cm  LV E/e' medial:  15.0 LV SV:          28 LV SV Index:   14 LVOT Area:     2.54 cm  RIGHT VENTRICLE RV S prime:     10.80 cm/s TAPSE (M-mode): 1.2 cm LEFT ATRIUM             Index       RIGHT ATRIUM           Index LA diam:        4.10 cm 2.05 cm/m  RA Area:     25.40 cm LA Vol (A2C):   48.6 ml 24.24 ml/m RA Volume:   95.90 ml  47.83 ml/m LA Vol (A4C):   71.3 ml 35.56 ml/m LA Biplane Vol: 62.6 ml 31.22 ml/m  AORTIC VALVE AV Area (Vmax):    1.45 cm AV Area (Vmean):   1.47 cm AV Area (VTI):     1.31 cm AV Vmax:           115.33 cm/s AV Vmean:          79.867 cm/s AV VTI:  0.212 m AV Peak Grad:      5.3 mmHg AV Mean Grad:      3.0 mmHg LVOT Vmax:         65.90 cm/s LVOT Vmean:        46.033 cm/s LVOT VTI:          0.109 m LVOT/AV VTI ratio: 0.51  AORTA Ao Root diam: 2.20 cm MITRAL VALVE                 TRICUSPID VALVE MV Area (PHT): 4.89 cm      TR Peak grad:   46.0 mmHg MV Decel Time: 155 msec      TR Vmax:        339.00 cm/s MR Peak grad:    90.6 mmHg MR Mean grad:    46.0 mmHg   SHUNTS MR Vmax:         476.00 cm/s Systemic VTI:  0.11 m MR Vmean:        305.0 cm/s  Systemic Diam: 1.80 cm MR PISA:         6.28 cm MR PISA Eff ROA: 42 mm MR PISA Radius:  1.00 cm MV E velocity: 97.70 cm/s MV A velocity: 25.70 cm/s MV E/A ratio:  3.80 Mihai Croitoru MD Electronically signed by Sanda Klein MD Signature Date/Time: 11/22/2019/12:30:55 PM    Final     Cardiac Studies   Echocardiogram 11/22/2019 1. Left ventricular ejection fraction, by estimation, is 40 to 45%. The  left ventricle has mildly decreased function. The left ventricle  demonstrates global hypokinesis. There is mild concentric left ventricular  hypertrophy. Left ventricular diastolic  function could not be evaluated.  2. Right ventricular systolic function is mildly reduced. The right  ventricular size is mildly enlarged. There is moderately elevated  pulmonary artery systolic pressure. The estimated right ventricular  systolic pressure is 95.1 mmHg.  3. Left  atrial size was severely dilated.  4. Right atrial size was severely dilated.  5. The mitral valve is normal in structure. Mild to moderate mitral valve  regurgitation.  6. The aortic valve has been repaired/replaced. Aortic valve  regurgitation is not visualized. There is a 26 mm Medtronic  CoreValve-EvolutR prosthetic (TAVR) valve present in the aortic position.  Procedure Date: September 2018. Echo findings are  consistent with normal structure and function of the aortic valve  prosthesis.  7. The inferior vena cava is dilated in size with <50% respiratory  variability, suggesting right atrial pressure of 15 mmHg.    Patient Profile     84 y.o. female with CAD (DES to LAD and RCA in 2018), history of severe AS status post TAVR (Medtronic evolut September 2018),bilateral carotid artery disease status post right TCAR (October 2020), LBBB, chronic heart failure with mildly reduced left ventricular ejection fraction (EF 45%), essential hypertension, hyperlipidemia, type 2 diabetes mellitus, morbid obesity, presenting with new onset atrial fibrillation rapid ventricular response and heart failure exacerbation.  Assessment & Plan     1. AFib:She had palpitations (which she describes as throbbing in her back) that preceded the heart failure symptoms for several days. She is now well rate controlled. On carvedilol 6.25. BID. CHA2DS2-VASc 7 Started Eliquis with dose adjusted for age and renal dysfunction. --If were able to easily compensate heart failure and controlled ventricular rate, would suggest cardioversion following 3 weeks of uninterrupted anticoagulation as an outpatient. However if ventricular rate control is not adequate and/or heart failure cannot be compensated with medical therapy, recommend  early TEE guided cardioversion. Defer to Dr. Debara Pickett  2. WVP:XTGG systolic and prob diastolic--Continues to have signs and symptoms of both left heart failure and right heart failure  and needs additional diuresis. Probably still roughly 8-9 pounds above "dry weight". Left ventricular systolic function looks a little worse on today's echo, but this may be simply related to the rapid rate and worsened dyssynchrony from the IVCD. (on coreg 6.25 BID, lasix)  --today neg 2,924 since admit.  Wt above admit wt if accurate and continues to climb despite diuresis.   --on lasix 60 mg IV BID --Cr 1.63 down from 1.67 on 11/22/19 and now back to 1.78 --K+ 3.3 replacement ordered.   3. YIR:SWNI history of stents in LAD and RCA, asymptomatic despite rapid ventricular rates and heart failure exacerbation.  4. OE:VOJJKKXFG control, hemoglobin A1c 5.4% now off medications.  5, HWE:XHBZ the exception of low HDL cholesterol, all other lipid parameters are well within target range. HDL unlikely to improve without substantial weight loss.  6. CKD 3b-4:Renal function has been stable diuresis. GFR appears to be right around 30.  Now 26  7. JIR:CVELFYBOF use of beta-blockers for rate control/heart failure and RAAS inhibitors.  BP 751W systolic   8. Gout:Note history of gout. Watch for exacerbation with aggressive diuresis. On colchicine. Most recent uric acid level that I have available for review is from 2018. 9.Mild macrocytic anemia:Unsure whether this has been evaluatedwith folic acid/vitamin C58 levels. Per IM  10.Morbid obesity recommend wt loss   11. Hx of TAVR and valve is stable with prosthetic AV  12.  Mild to mod MR.    For questions or updates, please contact [place] Please consult [email or web address] for contact info under        Signed, Cecilie Kicks, NP  11/24/2019, 8:51 AM

## 2019-11-24 NOTE — Progress Notes (Signed)
  Mobility Specialist Criteria Algorithm Info.  SATURATION QUALIFICATIONS: (This note is used to comply with regulatory documentation for home oxygen)  Patient Saturations on Room Air at Rest = n/a%  Patient Saturations on Room Air while Ambulating = n/a%  Patient Saturations on 4 Liters of oxygen while Ambulating = 89%  Please briefly explain why patient needs home oxygen:  Mobility Team:  [facility] elevated:Self regulated Activity: Ambulated in hall (In chair before and after ambulation) Range of motion: Active;All extremities Level of assistance: Standby assist, set-up cues, supervision of patient - no hands on Assistive device: Four wheel walker Minutes sitting in chair:  Minutes stood: 6 minutes Minutes ambulated: 6 minutes Distance ambulated (ft): 120 ft Mobility response: Tolerated well Bed Position: Chair (Noble chair)  Pt tolerated ambulating in hallway well. While ambulating pt desaturated to 84% on 3LO2, was bumped up to 4LO2 and was saturating better 89-90%. Denied dizziness/lightheadedness but mentioned tremors were increasingly getting worst secondary to fatigue.    11/24/2019 3:06 PM

## 2019-11-24 NOTE — TOC Benefit Eligibility Note (Signed)
Transition of Care [facility]) Benefit Eligibility Note    Patient Details  Name: [name] MRN: [id number] Date of Birth: [birth year]   Medication/Dose: Eliquis 2.64m and or 553m for 30 day supply bid.  Covered?: Yes  Tier: 3 Drug  Prescription Coverage Preferred Pharmacy: Any pharmacy  Spoke with Person/Company/Phone Number:: Kiaron H. W/ [employer] @888 [phone]  Co-Pay: $47.00  Prior Approval: No  Deductible: Met       HaShelda Alteshone Number: 11/24/2019, 9:06 AM

## 2019-11-25 ENCOUNTER — Inpatient Hospital Stay (HOSPITAL_COMMUNITY): Payer: Medicare Other

## 2019-11-25 LAB — BASIC METABOLIC PANEL
Anion gap: 8 (ref 5–15)
BUN: 54 mg/dL — ABNORMAL HIGH (ref 8–23)
CO2: 26 mmol/L (ref 22–32)
Calcium: 9 mg/dL (ref 8.9–10.3)
Chloride: 105 mmol/L (ref 98–111)
Creatinine, Ser: 1.71 mg/dL — ABNORMAL HIGH (ref 0.44–1.00)
GFR calc Af Amer: 32 mL/min — ABNORMAL LOW (ref >60)
GFR calc non Af Amer: 28 mL/min — ABNORMAL LOW (ref >60)
Glucose, Bld: 137 mg/dL — ABNORMAL HIGH (ref 70–99)
Potassium: 3.9 mmol/L (ref 3.5–5.1)
Sodium: 139 mmol/L (ref 135–145)

## 2019-11-25 MED ORDER — BISACODYL 10 MG RE SUPP
10.0000 mg | Freq: Once | RECTAL | Status: AC
  Administered 2019-11-25: 10 mg via RECTAL
  Filled 2019-11-25: qty 1

## 2019-11-25 MED ORDER — FUROSEMIDE 10 MG/ML IJ SOLN
60.0000 mg | Freq: Two times a day (BID) | INTRAMUSCULAR | Status: AC
  Administered 2019-11-25 – 2019-11-26 (×2): 60 mg via INTRAVENOUS
  Filled 2019-11-25 (×2): qty 6

## 2019-11-25 NOTE — Progress Notes (Signed)
Progress Note  Patient Name: Carrie Monroe Date of Encounter: 11/25/2019  Primary Cardiologist: Sanda Klein, MD   Subjective   Lethargic this morning for unclear reasons. Denies chest pain or SOB however states that she does not feel as well as she did yesterday   Inpatient Medications    Scheduled Meds: . apixaban  2.5 mg Oral BID  . carvedilol  12.5 mg Oral BID WC  . furosemide  60 mg Oral Daily  . rosuvastatin  10 mg Oral Daily  . sodium chloride flush  3 mL Intravenous Once  . sodium chloride flush  3 mL Intravenous Q12H   Continuous Infusions: . sodium chloride     PRN Meds: sodium chloride, acetaminophen, colchicine, docusate sodium, hydrALAZINE, ondansetron (ZOFRAN) IV, sodium chloride flush   Vital Signs    Vitals:   11/25/19 0557 11/25/19 0558 11/25/19 0602 11/25/19 0803  BP:    130/78  Pulse:  84  86  Resp: 20 (!) 25  20  Temp: 98 F (36.7 C)   98.4 F (36.9 C)  TempSrc: Oral   Oral  SpO2:  94%  92%  Weight:   103.8 kg   Height:        Intake/Output Summary (Last 24 hours) at 11/25/2019 0858 Last data filed at 11/25/2019 1191 Gross per 24 hour  Intake 720 ml  Output 2400 ml  Net -1680 ml   Filed Weights   11/23/19 0457 11/24/19 0444 11/25/19 0602  Weight: 103.4 kg 104.3 kg 103.8 kg    Physical Exam   General: Well developed, well nourished, NAD Neck: Negative for carotid bruits. No JVD Lungs:Clear to ausculation bilaterally. No wheezes, rales, or rhonchi. Breathing is unlabored. Cardiovascular: RRR with S1 S2. + murmur Abdomen: Soft, non-tender, non-distended. No obvious abdominal masses. Extremities: 2-3_ BLE  edema. Radial pulses 2+ bilaterally Neuro: Alert and oriented however lethargic this AM. No focal deficits. No facial asymmetry. MAE spontaneously. Psych: Responds to questions appropriately with normal affect.    Labs    Chemistry Recent Labs  Lab 11/21/19 1230 11/21/19 2033 11/22/19 0325 11/24/19 0656  11/25/19 0616  NA   < >  --  138 138 139  K   < >  --  3.5 3.3* 3.9  CL   < >  --  107 102 105  CO2   < >  --  23 26 26   GLUCOSE   < >  --  148* 137* 137*  BUN   < >  --  46* 50* 54*  CREATININE   < >  --  1.63* 1.78* 1.71*  CALCIUM   < >  --  8.8* 9.1 9.0  PROT  --  6.4*  --   --   --   ALBUMIN  --  3.5  --   --   --   AST  --  17  --   --   --   ALT  --  15  --   --   --   ALKPHOS  --  89  --   --   --   BILITOT  --  0.9  --   --   --   GFRNONAA   < >  --  29* 26* 28*  GFRAA   < >  --  34* 30* 32*  ANIONGAP   < >  --  8 10 8    < > = values in this interval not displayed.  Hematology Recent Labs  Lab 11/21/19 1230 11/22/19 0325  WBC 6.7 6.7  RBC 3.40* 3.24*  HGB 10.8* 10.5*  HCT 34.1* 32.6*  MCV 100.3* 100.6*  MCH 31.8 32.4  MCHC 31.7 32.2  RDW 13.9 14.0  PLT 157 155    Cardiac EnzymesNo results for input(s): TROPONINI in the last 168 hours. No results for input(s): TROPIPOC in the last 168 hours.   BNP Recent Labs  Lab 11/21/19 2033  BNP 278.1*     DDimer No results for input(s): DDIMER in the last 168 hours.   Radiology    No results found.  Telemetry    11/25/19 AF with rate control in the 80's- Personally Reviewed  ECG    No new tracing as of 11/25/19- Personally Reviewed  Cardiac Studies   Echocardiogram 11/22/2019 1. Left ventricular ejection fraction, by estimation, is 40 to 45%. The  left ventricle has mildly decreased function. The left ventricle  demonstrates global hypokinesis. There is mild concentric left ventricular  hypertrophy. Left ventricular diastolic  function could not be evaluated.  2. Right ventricular systolic function is mildly reduced. The right  ventricular size is mildly enlarged. There is moderately elevated  pulmonary artery systolic pressure. The estimated right ventricular  systolic pressure is 09.3 mmHg.  3. Left atrial size was severely dilated.  4. Right atrial size was severely dilated.  5. The mitral  valve is normal in structure. Mild to moderate mitral valve  regurgitation.  6. The aortic valve has been repaired/replaced. Aortic valve  regurgitation is not visualized. There is a 26 mm Medtronic  CoreValve-EvolutR prosthetic (TAVR) valve present in the aortic position.  Procedure Date: September 2018. Echo findings are  consistent with normal structure and function of the aortic valve  prosthesis.  7. The inferior vena cava is dilated in size with <50% respiratory  variability, suggesting right atrial pressure of 15 mmHg.   Patient Profile     84 y.o. female with CAD (DES to LAD and RCA in 2018), history of severe AS status post TAVR (Medtronic evolut September 2018),bilateral carotid artery disease status post right TCAR (October 2020), LBBB, chronic heart failure with mildly reduced left ventricular ejection fraction (EF 45%), essential hypertension, hyperlipidemia, type 2 diabetes mellitus, morbid obesity, presenting with new onset atrial fibrillation rapid ventricular response and heart failure exacerbation.  Assessment & Plan    1.  Acute on chronic CHF HF exacerbation: -Echocardiogram performed 11/22/2019 with LVEF of 40 to 45% with global hypokinesis, severe LA, severe RA, mild to moderate MR with evidence of prior AVR with 26 mm Medtronic TAVR>> valve consistent with normal structure and function -HF felt to be in the setting of new onset atrial fibrillation -Previously on IV Lasix 60 mg twice daily with plans to transition to p.o. today -Weight, 228lb  -I&O, net -4.6 L -Creatinine, 1.71 today down from 1.78 yesterday -Continue p.o. Lasix 60 mg daily -No ACEI/ARB in the setting of renal insufficiency -Continue carvedilol 12.5 mg p.o. twice daily  2.  New onset atrial fibrillation: -Presented with palpitations described as a throbbing in her back which preceded her heart failure symptoms for several days prior to presentation -Initially placed on IV diltiazem>> replaced  with increased carvedilol dosing at 12.5 mg p.o. twice daily for rate control -Plan likely for OP DCCV after 3 weeks of uninterrupted anticoagulation given elevated -CHA2DS2VASc =7  3.  CAD with prior stenting to LAD/RCA: -Asymptomatic without CV symptoms -Continue current regimen  4.  CKD stage  III: -Creatinine, 1.71 today down from 1.78 with a baseline that appears to be in the 1.4-1.6 range  5.  Hypertension: -Stable, 130/78, 124/78, 128/73 -Continue current regimen  6.  History of TAVR: -Stable on last echocardiogram  Signed, Kathyrn Drown NP-C [facility] Pager: [phone] 11/25/2019, 8:58 AM     For questions or updates, please contact   Please consult [email or web address] for contact info under Cardiology/STEMI.

## 2019-11-25 NOTE — Care Management Important Message (Signed)
Important Message  Patient Details  Name: Carrie Monroe MRN: [id number] Date of Birth: [birth year]   Medicare Important Message Given:  Yes     Shelda Altes 11/25/2019, 8:30 AM

## 2019-11-25 NOTE — Progress Notes (Signed)
Pt is stable. Pt is sitting in the chair. No needs or concerns at this time. Will continue to monitor.

## 2019-11-26 DIAGNOSIS — R0902 Hypoxemia: Secondary | ICD-10-CM

## 2019-11-26 MED ORDER — CARVEDILOL 25 MG PO TABS
25.0000 mg | ORAL_TABLET | Freq: Two times a day (BID) | ORAL | Status: DC
  Administered 2019-11-26 – 2019-11-28 (×4): 25 mg via ORAL
  Filled 2019-11-26 (×4): qty 1

## 2019-11-26 MED ORDER — FUROSEMIDE 10 MG/ML IJ SOLN
60.0000 mg | Freq: Once | INTRAMUSCULAR | Status: AC
  Administered 2019-11-26: 60 mg via INTRAVENOUS
  Filled 2019-11-26: qty 6

## 2019-11-26 MED ORDER — FUROSEMIDE 40 MG PO TABS
60.0000 mg | ORAL_TABLET | Freq: Every day | ORAL | Status: DC
  Administered 2019-11-27 – 2019-11-28 (×2): 60 mg via ORAL
  Filled 2019-11-26 (×2): qty 1

## 2019-11-26 NOTE — Progress Notes (Signed)
Progress Note  Patient Name: Carrie Monroe Date of Encounter: 11/26/2019  Primary Cardiologist: Sanda Klein, MD  Subjective   Pt feeling better today. Diuresed well yesterday. Continues to drop O2 saturations without supplemental oxygen. 92% with 2L>>>88/89% RA  Inpatient Medications    Scheduled Meds: . apixaban  2.5 mg Oral BID  . carvedilol  12.5 mg Oral BID WC  . furosemide  60 mg Intravenous BID  . rosuvastatin  10 mg Oral Daily  . sodium chloride flush  3 mL Intravenous Once  . sodium chloride flush  3 mL Intravenous Q12H   Continuous Infusions: . sodium chloride     PRN Meds: sodium chloride, acetaminophen, colchicine, docusate sodium, hydrALAZINE, ondansetron (ZOFRAN) IV, sodium chloride flush   Vital Signs    Vitals:   11/26/19 0000 11/26/19 0400 11/26/19 0500 11/26/19 0541  BP: 132/65 138/76 137/75   Pulse: 95 87 83   Resp:  19    Temp: 98 F (36.7 C)  98.8 F (37.1 C)   TempSrc: Oral  Oral   SpO2: 97% 95% 95%   Weight:    103.1 kg  Height:        Intake/Output Summary (Last 24 hours) at 11/26/2019 0721 Last data filed at 11/26/2019 0545 Gross per 24 hour  Intake 480 ml  Output 3200 ml  Net -2720 ml   Filed Weights   11/24/19 0444 11/25/19 0602 11/26/19 0541  Weight: 104.3 kg 103.8 kg 103.1 kg    Physical Exam   General: Well developed, well nourished, NAD Neck: Negative for carotid bruits. No JVD Lungs:Clear to ausculation bilaterally. No wheezes, rales, or rhonchi. Breathing is unlabored. Cardiovascular: Irregularly irregular with S1 S2. No murmur Extremities: 3+ BLE edema. Radial pulses 2+ bilaterally Neuro: Alert and oriented. No focal deficits. No facial asymmetry. MAE spontaneously. Psych: Responds to questions appropriately with normal affect.    Labs    Chemistry Recent Labs  Lab 11/21/19 1230 11/21/19 2033 11/22/19 0325 11/24/19 0656 11/25/19 0616  NA   < >  --  138 138 139  K   < >  --  3.5 3.3* 3.9  CL   < >   --  107 102 105  CO2   < >  --  23 26 26   GLUCOSE   < >  --  148* 137* 137*  BUN   < >  --  46* 50* 54*  CREATININE   < >  --  1.63* 1.78* 1.71*  CALCIUM   < >  --  8.8* 9.1 9.0  PROT  --  6.4*  --   --   --   ALBUMIN  --  3.5  --   --   --   AST  --  17  --   --   --   ALT  --  15  --   --   --   ALKPHOS  --  89  --   --   --   BILITOT  --  0.9  --   --   --   GFRNONAA   < >  --  29* 26* 28*  GFRAA   < >  --  34* 30* 32*  ANIONGAP   < >  --  8 10 8    < > = values in this interval not displayed.     Hematology Recent Labs  Lab 11/21/19 1230 11/22/19 0325  WBC 6.7 6.7  RBC 3.40*  3.24*  HGB 10.8* 10.5*  HCT 34.1* 32.6*  MCV 100.3* 100.6*  MCH 31.8 32.4  MCHC 31.7 32.2  RDW 13.9 14.0  PLT 157 155    Cardiac EnzymesNo results for input(s): TROPONINI in the last 168 hours. No results for input(s): TROPIPOC in the last 168 hours.   BNP Recent Labs  Lab 11/21/19 2033  BNP 278.1*     DDimer No results for input(s): DDIMER in the last 168 hours.   Radiology    DG CHEST PORT 1 VIEW  Result Date: 11/25/2019 CLINICAL DATA:  Dyspnea and hypoxia EXAM: PORTABLE CHEST 1 VIEW COMPARISON:  11/21/2019 FINDINGS: Similar interstitial prominence. Increased patchy density at the lung bases. Probable small pleural effusions. No pneumothorax. Stable cardiomediastinal contours. IMPRESSION: Similar stitched or prominence likely reflecting combination of chronic changes and mild interstitial edema. Increased patchy atelectasis/consolidation at the lung bases. Probable small pleural effusions. Electronically Signed   By: Macy Mis M.D.   On: 11/25/2019 12:40   Telemetry    11/26/19 AF with HR 90's - Personally Reviewed  ECG    No new tracing as of 11/26/19- Personally Reviewed  Cardiac Studies   Echocardiogram 11/22/2019 1. Left ventricular ejection fraction, by estimation, is 40 to 45%. The  left ventricle has mildly decreased function. The left ventricle  demonstrates global  hypokinesis. There is mild concentric left ventricular  hypertrophy. Left ventricular diastolic  function could not be evaluated.  2. Right ventricular systolic function is mildly reduced. The right  ventricular size is mildly enlarged. There is moderately elevated  pulmonary artery systolic pressure. The estimated right ventricular  systolic pressure is 83.1 mmHg.  3. Left atrial size was severely dilated.  4. Right atrial size was severely dilated.  5. The mitral valve is normal in structure. Mild to moderate mitral valve  regurgitation.  6. The aortic valve has been repaired/replaced. Aortic valve  regurgitation is not visualized. There is a 26 mm Medtronic  CoreValve-EvolutR prosthetic (TAVR) valve present in the aortic position.  Procedure Date: September 2018. Echo findings are  consistent with normal structure and function of the aortic valve  prosthesis.  7. The inferior vena cava is dilated in size with <50% respiratory  variability, suggesting right atrial pressure of 15 mmHg.   Patient Profile     85 y.o. female with CAD (DES to LAD and RCA in 2018), history of severe AS status post TAVR (Medtronic evolut September 2018),bilateral carotid artery disease status post right TCAR (October 2020), LBBB, chronic heart failure with mildly reduced left ventricular ejection fraction (EF 45%), essential hypertension, hyperlipidemia, type 2 diabetes mellitus, morbid obesity, presenting with new onset atrial fibrillation rapid ventricular response and heart failure exacerbation.  Assessment & Plan    1.  Acute on chronic CHF HF exacerbation: -Echocardiogram performed 11/22/2019 with LVEF of 40 to 45% with global hypokinesis, severe LA, severe RA, mild to moderate MR with evidence of prior AVR with 26 mm Medtronic TAVR>> valve consistent with normal structure and function -HF felt to be in the setting of new onset atrial fibrillation -Previously on IV Lasix 60 mg twice daily with  plans to transition to p.o. today -Additional dose of IV Lasix given yesterday  -Weight, 227lb, down from 228lb yesterday  -I&O, net -7.5 L -Creatinine, 1.71 today down from 1.78 yesterday -CXR 11/25/19 with increased patchy atelectasis/consolidation at the lung bases. Probable small pleural effusions. -Continue IV Lasix 60 mg daily>>>given CXR and LE edema along with hypoxia>>would continue IV lasix  at least another day  -No ACEI/ARB in the setting of renal insufficiency -Increase carvedilol to 25 mg p.o. twice daily given elevated rates and stable BP  2.  New onset atrial fibrillation: -Presented with palpitations described as a throbbing in her back which preceded her heart failure symptoms for several days prior to presentation -Initially placed on IV diltiazem>>increased carvedilol to 25 mg p.o. twice daily for better rate control -Plan likely for OP DCCV after 3 weeks of uninterrupted anticoagulation given elevated -CHA2DS2VASc =7  3.  CAD with prior stenting to LAD/RCA: -Asymptomatic without CV symptoms -Continue current regimen  4.  CKD stage III: -Creatinine, 1.71 yesterday -Pending labs today  -Baseline that appears to be in the 1.4-1.6 range  5.  Hypertension: -Stable, 137/75>138/75>132/65 -Continue current regimen  6.  History of TAVR: -Stable on last echocardiogram  Signed, Kathyrn Drown NP-C [facility] Pager: [phone] 11/26/2019, 7:21 AM     For questions or updates, please contact   Please consult [email or web address] for contact info under Cardiology/STEMI.'

## 2019-11-26 NOTE — Progress Notes (Signed)
Pt stable. Pt sitting in chair, resting. No needs or concerns at this time. NO signs of distress noted., Will continue to monitor.

## 2019-11-26 NOTE — Progress Notes (Deleted)
Pt is stable. Pt is awake and oriented x4. Husband at the bedside. No signs of distress noted. Dressing in clean, dry and intact. No signs of bleeding or hematoma. Will continue to monitor.

## 2019-11-27 ENCOUNTER — Inpatient Hospital Stay (HOSPITAL_COMMUNITY): Payer: Medicare Other

## 2019-11-27 LAB — BASIC METABOLIC PANEL
Anion gap: 9 (ref 5–15)
BUN: 47 mg/dL — ABNORMAL HIGH (ref 8–23)
CO2: 30 mmol/L (ref 22–32)
Calcium: 9.2 mg/dL (ref 8.9–10.3)
Chloride: 102 mmol/L (ref 98–111)
Creatinine, Ser: 1.47 mg/dL — ABNORMAL HIGH (ref 0.44–1.00)
GFR calc Af Amer: 38 mL/min — ABNORMAL LOW (ref >60)
GFR calc non Af Amer: 33 mL/min — ABNORMAL LOW (ref >60)
Glucose, Bld: 176 mg/dL — ABNORMAL HIGH (ref 70–99)
Potassium: 3.4 mmol/L — ABNORMAL LOW (ref 3.5–5.1)
Sodium: 141 mmol/L (ref 135–145)

## 2019-11-27 MED ORDER — FUROSEMIDE 10 MG/ML IJ SOLN
60.0000 mg | Freq: Once | INTRAMUSCULAR | Status: AC
  Administered 2019-11-27: 60 mg via INTRAVENOUS
  Filled 2019-11-27: qty 6

## 2019-11-27 MED ORDER — POTASSIUM CHLORIDE CRYS ER 20 MEQ PO TBCR
40.0000 meq | EXTENDED_RELEASE_TABLET | Freq: Once | ORAL | Status: AC
  Administered 2019-11-27: 40 meq via ORAL
  Filled 2019-11-27: qty 2

## 2019-11-27 NOTE — Progress Notes (Signed)
SATURATION QUALIFICATIONS: (This note is used to comply with regulatory documentation for home oxygen)  Patient Saturations on Room Air at Rest = 90%  Patient Saturations on Room Air while Ambulating = 83%  Patient Saturations on 2 Liters of oxygen while Ambulating = 92%  Please briefly explain why patient needs home oxygen:pts oxygen saturation dropped rapidly to 83% on RA. Placed [place] on 2L, oxygen saturation up to 92%. Pt is currently sitting in chair. purse lip breathing technic teaching. Call bell within reach.

## 2019-11-27 NOTE — Progress Notes (Signed)
Progress Note  Patient Name: Carrie Monroe Date of Encounter: 11/27/2019  Primary Cardiologist: Sanda Klein, MD  Subjective   Continues to have c/o fatigue. Still dropping O2 sats on RA. No chest pain or SOB  Inpatient Medications    Scheduled Meds: . apixaban  2.5 mg Oral BID  . carvedilol  25 mg Oral BID WC  . furosemide  60 mg Oral Daily  . rosuvastatin  10 mg Oral Daily  . sodium chloride flush  3 mL Intravenous Once  . sodium chloride flush  3 mL Intravenous Q12H   Continuous Infusions: . sodium chloride     PRN Meds: sodium chloride, acetaminophen, colchicine, docusate sodium, hydrALAZINE, ondansetron (ZOFRAN) IV, sodium chloride flush   Vital Signs    Vitals:   11/27/19 0430 11/27/19 0453 11/27/19 0500 11/27/19 0550  BP:   124/68   Pulse: 83 80 76   Resp:  15 18   Temp:   98.6 F (37 C)   TempSrc:   Oral   SpO2: 90% 92% 91%   Weight:    100.6 kg  Height:        Intake/Output Summary (Last 24 hours) at 11/27/2019 0756 Last data filed at 11/27/2019 0603 Gross per 24 hour  Intake 480 ml  Output 2150 ml  Net -1670 ml   Filed Weights   11/25/19 0602 11/26/19 0541 11/27/19 0550  Weight: 103.8 kg 103.1 kg 100.6 kg    Physical Exam   General: Well developed, well nourished, NAD Neck: Negative for carotid bruits. No JVD Lungs:Clear to ausculation bilaterally. No wheezes, rales, or rhonchi. Breathing is unlabored. Cardiovascular: Irregularly irregular with S1 S2. No murmurs Abdomen: Soft, non-tender, non-distended. No obvious abdominal masses. Extremities: 2+BLE edema. Radial pulses 2+ bilaterally Neuro: Alert and oriented. No focal deficits. No facial asymmetry. MAE spontaneously. Psych: Responds to questions appropriately with normal affect.    Labs    Chemistry Recent Labs  Lab 11/21/19 1230 11/21/19 2033 11/22/19 0325 11/24/19 0656 11/25/19 0616  NA   < >  --  138 138 139  K   < >  --  3.5 3.3* 3.9  CL   < >  --  107 102 105    CO2   < >  --  23 26 26   GLUCOSE   < >  --  148* 137* 137*  BUN   < >  --  46* 50* 54*  CREATININE   < >  --  1.63* 1.78* 1.71*  CALCIUM   < >  --  8.8* 9.1 9.0  PROT  --  6.4*  --   --   --   ALBUMIN  --  3.5  --   --   --   AST  --  17  --   --   --   ALT  --  15  --   --   --   ALKPHOS  --  89  --   --   --   BILITOT  --  0.9  --   --   --   GFRNONAA   < >  --  29* 26* 28*  GFRAA   < >  --  34* 30* 32*  ANIONGAP   < >  --  8 10 8    < > = values in this interval not displayed.     Hematology Recent Labs  Lab 11/21/19 1230 11/22/19 0325  WBC 6.7 6.7  RBC 3.40* 3.24*  HGB 10.8* 10.5*  HCT 34.1* 32.6*  MCV 100.3* 100.6*  MCH 31.8 32.4  MCHC 31.7 32.2  RDW 13.9 14.0  PLT 157 155    Cardiac EnzymesNo results for input(s): TROPONINI in the last 168 hours. No results for input(s): TROPIPOC in the last 168 hours.   BNP Recent Labs  Lab 11/21/19 2033  BNP 278.1*     DDimer No results for input(s): DDIMER in the last 168 hours.   Radiology    DG CHEST PORT 1 VIEW  Result Date: 11/25/2019 CLINICAL DATA:  Dyspnea and hypoxia EXAM: PORTABLE CHEST 1 VIEW COMPARISON:  11/21/2019 FINDINGS: Similar interstitial prominence. Increased patchy density at the lung bases. Probable small pleural effusions. No pneumothorax. Stable cardiomediastinal contours. IMPRESSION: Similar stitched or prominence likely reflecting combination of chronic changes and mild interstitial edema. Increased patchy atelectasis/consolidation at the lung bases. Probable small pleural effusions. Electronically Signed   By: Macy Mis M.D.   On: 11/25/2019 12:40   Telemetry    11/27/19 AF with rates in the 80's- Personally Reviewed  ECG    No new tracing as of 11/27/19- Personally Reviewed  Cardiac Studies   Echocardiogram 11/22/2019 1. Left ventricular ejection fraction, by estimation, is 40 to 45%. The  left ventricle has mildly decreased function. The left ventricle  demonstrates global  hypokinesis. There is mild concentric left ventricular  hypertrophy. Left ventricular diastolic  function could not be evaluated.  2. Right ventricular systolic function is mildly reduced. The right  ventricular size is mildly enlarged. There is moderately elevated  pulmonary artery systolic pressure. The estimated right ventricular  systolic pressure is 16.1 mmHg.  3. Left atrial size was severely dilated.  4. Right atrial size was severely dilated.  5. The mitral valve is normal in structure. Mild to moderate mitral valve  regurgitation.  6. The aortic valve has been repaired/replaced. Aortic valve  regurgitation is not visualized. There is a 26 mm Medtronic  CoreValve-EvolutR prosthetic (TAVR) valve present in the aortic position.  Procedure Date: September 2018. Echo findings are  consistent with normal structure and function of the aortic valve  prosthesis.  7. The inferior vena cava is dilated in size with <50% respiratory  variability, suggesting right atrial pressure of 15 mmHg.   Patient Profile     84 y.o. female with CAD (DES to LAD and RCA in 2018), history of severe AS status post TAVR (Medtronic evolut September 2018),bilateral carotid artery disease status post right TCAR (October 2020), LBBB, chronic heart failure with mildly reduced left ventricular ejection fraction (EF 45%), essential hypertension, hyperlipidemia, type 2 diabetes mellitus, morbid obesity, presenting with new onset atrial fibrillation rapid ventricular response and heart failure exacerbation.  Assessment & Plan    1. Acute on chronic CHF HF exacerbation: -Echocardiogram performed 11/22/2019 with LVEF of 40 to 45% with global hypokinesis, severe LA, severe RA, mild to moderate MR with evidence of prior AVR with 26 mm MedtronicTAVR>>valve consistent with normal structure and function -HFfelt to be in the setting of new onset atrial fibrillation -Previously on IV Lasix 60 mg twice daily>> given  an extra dose of IV Lasix yesterday given persistent LE edema and hypoxia on RA  -Weight, 221lb>>>> down from 227lb -I&O, net -9.2  -BMET pending>>was 1.71 yesterday  -CXR 11/25/19 with increased patchy atelectasis/consolidation at the lung bases. Probable small pleural effusions. -Repeat CXR given persistent hypoxia -Transition Lasix to p.o. 60mg  QD -No ACEI/ARB in the setting of renal insufficiency -  Increase carvedilol to 25 mg p.o. twice daily given elevated rates and stable BP  2. New onset atrial fibrillation: -Presented with palpitations described as a throbbing in her back which preceded her heart failure symptoms for several days prior to presentation -Initially placed on IV diltiazem>>increased carvedilol to 25 mg p.o. twice daily for better rate control -Rates were stable today in the 80s per telemetry review -Plan likely for OP DCCV after 3 weeks of uninterrupted anticoagulation given elevated -CHA2DS2VASc =7  3. CAD with prior stenting to LAD/RCA: -Asymptomatic without CV symptoms -Continue current regimen  4. CKD stage III: -Creatinine, 1.71 yesterday>>BMET pending -Pending labs today  -Baseline that appears to be in the 1.4-1.6 range  5. Hypertension: -Stable,  three 7/75, 136/74, 124/68 -Continue current regimen  6. History of TAVR: -Stable on last echocardiogram  7.  Hypoxia: -Unclear etiology given good diuresis -CXR on 11/25/2019 with increased pasty atelectasis/consolidation at the lung bases with probable small pleural effusions -Repeat CXR today to assess for improvement    Signed, Kathyrn Drown NP-C [facility] Pager: [phone] 11/27/2019, 7:56 AM     For questions or updates, please contact   Please consult [email or web address] for contact info under Cardiology/STEMI.

## 2019-11-27 NOTE — Progress Notes (Signed)
Attempted to wean to RA sats dropped to 88%. Placed on 1L [place] sats 92%. Will continue to monitor patient.

## 2019-11-27 NOTE — TOC Transition Note (Signed)
Transition of Care [facility]) - CM/SW Discharge Note   Patient Details  Name: Carrie Monroe MRN: [id number] Date of Birth: 1935/10/29  Transition of Care [facility]) CM/SW Contact:  [name], RN Phone Number: 11/27/2019, 4:54 PM   Clinical Narrative:    Patient for dc tomorrow, NCM offered choice , she chose [facility], referral made to [place], she is able to take referral.  Soc will begin 24 to 48 hrs post dc  NCM made referral to [facility] with [employer] for home oxygen it will be delivered to her room tomorrow.    Final next level of care: [place] Barriers to Discharge: Continued Medical Work up   Patient Goals and CMS Choice Patient states their goals for this hospitalization and ongoing recovery are:: get better CMS Medicare.gov Compare Post Acute Care list provided to:: Patient Choice offered to / list presented to : Patient  Discharge Placement                       Discharge Plan and Services                DME Arranged: Oxygen DME Agency: [employer] Date DME Agency Contacted: 11/27/19 Time DME Agency Contacted: [phone] Representative spoke with at DME Agency: Thedore Mins HH Arranged: RN, PT [place] Agency: [facility] [place] Date [place]: 11/27/19 Time [place]: 1653 Representative spoke with at [place]: [place]  Social Determinants of Health ([place]) Interventions     Readmission Risk Interventions No flowsheet data found.

## 2019-11-28 ENCOUNTER — Encounter (HOSPITAL_COMMUNITY): Payer: Self-pay | Admitting: Cardiology

## 2019-11-28 ENCOUNTER — Telehealth: Payer: Self-pay | Admitting: Nurse Practitioner

## 2019-11-28 DIAGNOSIS — I4819 Other persistent atrial fibrillation: Secondary | ICD-10-CM

## 2019-11-28 LAB — BASIC METABOLIC PANEL
Anion gap: 8 (ref 5–15)
BUN: 51 mg/dL — ABNORMAL HIGH (ref 8–23)
CO2: 31 mmol/L (ref 22–32)
Calcium: 9.1 mg/dL (ref 8.9–10.3)
Chloride: 101 mmol/L (ref 98–111)
Creatinine, Ser: 1.41 mg/dL — ABNORMAL HIGH (ref 0.44–1.00)
GFR calc Af Amer: 40 mL/min — ABNORMAL LOW (ref >60)
GFR calc non Af Amer: 35 mL/min — ABNORMAL LOW (ref >60)
Glucose, Bld: 130 mg/dL — ABNORMAL HIGH (ref 70–99)
Potassium: 3.6 mmol/L (ref 3.5–5.1)
Sodium: 140 mmol/L (ref 135–145)

## 2019-11-28 LAB — MAGNESIUM: Magnesium: 1.8 mg/dL (ref 1.7–2.4)

## 2019-11-28 MED ORDER — MAGNESIUM OXIDE 400 (241.3 MG) MG PO TABS: 400.0000 mg | ORAL_TABLET | Freq: Every day | ORAL | 6 refills | Status: DC

## 2019-11-28 MED ORDER — APIXABAN 2.5 MG PO TABS: 2.5000 mg | ORAL_TABLET | Freq: Two times a day (BID) | ORAL | 6 refills | Status: DC

## 2019-11-28 MED ORDER — POTASSIUM CHLORIDE CRYS ER 20 MEQ PO TBCR
40.0000 meq | EXTENDED_RELEASE_TABLET | Freq: Once | ORAL | Status: AC
  Administered 2019-11-28: 40 meq via ORAL
  Filled 2019-11-28: qty 2

## 2019-11-28 MED ORDER — FUROSEMIDE 40 MG PO TABS: 60.0000 mg | ORAL_TABLET | Freq: Every day | ORAL | 6 refills | Status: DC

## 2019-11-28 MED ORDER — MAGNESIUM OXIDE 400 (241.3 MG) MG PO TABS
400.0000 mg | ORAL_TABLET | Freq: Every day | ORAL | Status: DC
  Administered 2019-11-28: 400 mg via ORAL
  Filled 2019-11-28: qty 1

## 2019-11-28 MED ORDER — MAGNESIUM SULFATE 2 GM/50ML IV SOLN
2.0000 g | Freq: Once | INTRAVENOUS | Status: AC
  Administered 2019-11-28: 2 g via INTRAVENOUS
  Filled 2019-11-28: qty 50

## 2019-11-28 MED ORDER — CARVEDILOL 25 MG PO TABS: 25.0000 mg | ORAL_TABLET | Freq: Two times a day (BID) | ORAL | 6 refills | Status: DC

## 2019-11-28 NOTE — Progress Notes (Signed)
Discharge and medication education given with teach back. Questions answered.  Peripheral iv removed and dressing applied. Family at bedside. Pt's belongings with UE:BVPL phone, clothes, shoes, bag.

## 2019-11-28 NOTE — TOC Transition Note (Signed)
Transition of Care [facility]) - CM/SW Discharge Note   Patient Details  Name: Carrie Monroe MRN: [id number] Date of Birth: 30-Mar-1936  Transition of Care [facility]) CM/SW Contact:  Bartholomew Crews, RN Phone Number: [phone] 11/28/2019, 1:24 PM   Clinical Narrative:     Notified by nursing of patient discharging today with home health services. Previous NCM had arranged PT, RN with [facility]. HH orders provided by MD. Karsten Fells at [facility] notified of discharge. No further TOC needs identified.   Final next level of care: [place] Barriers to Discharge: No Barriers Identified   Patient Goals and CMS Choice Patient states their goals for this hospitalization and ongoing recovery are:: get better CMS Medicare.gov Compare Post Acute Care list provided to:: Patient Choice offered to / list presented to : Patient  Discharge Placement                       Discharge Plan and Services                DME Arranged: Oxygen DME Agency: [employer] Date DME Agency Contacted: 11/27/19 Time DME Agency Contacted: [phone] Representative spoke with at DME Agency: Thedore Mins HH Arranged: RN, PT [place] Agency: [facility] Date [place]: 11/28/19 Time [place]: [place] Representative spoke with at [place]: [place] Interventions     Readmission Risk Interventions No flowsheet data found.

## 2019-11-28 NOTE — Progress Notes (Signed)
Progress Note  Patient Name: Carrie Monroe Date of Encounter: 11/28/2019  Primary Cardiologist: Sanda Klein, MD  Subjective   Sitting in bedside chair.  Anticipates going home today.  No breathlessness or palpitations at rest.  Inpatient Medications    Scheduled Meds:  apixaban  2.5 mg Oral BID   carvedilol  25 mg Oral BID WC   furosemide  60 mg Oral Daily   potassium chloride  40 mEq Oral Once   rosuvastatin  10 mg Oral Daily   sodium chloride flush  3 mL Intravenous Once   sodium chloride flush  3 mL Intravenous Q12H   Continuous Infusions:  sodium chloride     magnesium sulfate bolus IVPB     PRN Meds: sodium chloride, acetaminophen, colchicine, docusate sodium, hydrALAZINE, ondansetron (ZOFRAN) IV, sodium chloride flush   Vital Signs    Vitals:   11/27/19 2000 11/28/19 0010 11/28/19 0434 11/28/19 0817  BP: 128/68 124/66 116/64 (!) 141/78  Pulse: 84 (!) 108 81 88  Resp: 17 20 19 18   Temp: 98 F (36.7 C) 98.3 F (36.8 C) 98.6 F (37 C) 98.5 F (36.9 C)  TempSrc: Oral Oral Oral Oral  SpO2: 97% 96% 94% 96%  Weight:   99.9 kg   Height:        Intake/Output Summary (Last 24 hours) at 11/28/2019 1103 Last data filed at 11/28/2019 0444 Gross per 24 hour  Intake 580 ml  Output 2075 ml  Net -1495 ml   Filed Weights   11/26/19 0541 11/27/19 0550 11/28/19 0434  Weight: 103.1 kg 100.6 kg 99.9 kg    Telemetry    Rate controlled atrial fibrillation.  Personally reviewed.  Physical Exam   GEN:  Elderly woman, no acute distress.   Neck: No JVD. Cardiac:  Irregularly irregular, no murmur or gallop.  Respiratory: Nonlabored. Clear to auscultation bilaterally. GI: Soft, nontender, bowel sounds present. MS:  Mild lower leg edema; No deformity.  Labs    Chemistry Recent Labs  Lab 11/21/19 2033 11/22/19 0325 11/25/19 0616 11/27/19 0855 11/28/19 0458  NA  --    < > 139 141 140  K  --    < > 3.9 3.4* 3.6  CL  --    < > 105 102 101  CO2   --    < > 26 30 31   GLUCOSE  --    < > 137* 176* 130*  BUN  --    < > 54* 47* 51*  CREATININE  --    < > 1.71* 1.47* 1.41*  CALCIUM  --    < > 9.0 9.2 9.1  PROT 6.4*  --   --   --   --   ALBUMIN 3.5  --   --   --   --   AST 17  --   --   --   --   ALT 15  --   --   --   --   ALKPHOS 89  --   --   --   --   BILITOT 0.9  --   --   --   --   GFRNONAA  --    < > 28* 33* 35*  GFRAA  --    < > 32* 38* 40*  ANIONGAP  --    < > 8 9 8    < > = values in this interval not displayed.     Hematology Recent Labs  Lab 11/21/19  1230 11/22/19 0325  WBC 6.7 6.7  RBC 3.40* 3.24*  HGB 10.8* 10.5*  HCT 34.1* 32.6*  MCV 100.3* 100.6*  MCH 31.8 32.4  MCHC 31.7 32.2  RDW 13.9 14.0  PLT 157 155    BNP Recent Labs  Lab 11/21/19 2033  BNP 278.1*    ` Radiology    DG CHEST PORT 1 VIEW  Result Date: 11/27/2019 CLINICAL DATA:  Shortness of breath. EXAM: PORTABLE CHEST 1 VIEW COMPARISON:  11/25/2019.  CT 01/10/2017. FINDINGS: Prior cardiac valve repair. Stable cardiomegaly. Bilateral interstitial prominence, slightly improved from prior exam suggesting improving interstitial edema. A component of underlying chronic interstitial lung disease may be present. Tiny pleural effusions cannot be excluded. No pneumothorax. Large calcific density is noted over the left chest most consistent with fibroadenoma. This is stable from prior CT of 01/10/2017. Degenerative change thoracic spine. IMPRESSION: Prior cardiac valve replacement. Cardiomegaly. Bilateral interstitial prominence again noted, improved from prior exam. Findings suggest improving CHF. A component of underlying chronic interstitial lung disease can not be excluded. Tiny bilateral pleural effusions cannot be excluded. Electronically Signed   By: Marcello Moores  Register   On: 11/27/2019 12:06    Cardiac Studies   Echocardiogram 11/22/2019: 1. Left ventricular ejection fraction, by estimation, is 40 to 45%. The  left ventricle has mildly decreased  function. The left ventricle  demonstrates global hypokinesis. There is mild concentric left ventricular  hypertrophy. Left ventricular diastolic  function could not be evaluated.  2. Right ventricular systolic function is mildly reduced. The right  ventricular size is mildly enlarged. There is moderately elevated  pulmonary artery systolic pressure. The estimated right ventricular  systolic pressure is 10.2 mmHg.  3. Left atrial size was severely dilated.  4. Right atrial size was severely dilated.  5. The mitral valve is normal in structure. Mild to moderate mitral valve  regurgitation.  6. The aortic valve has been repaired/replaced. Aortic valve  regurgitation is not visualized. There is a 26 mm Medtronic  CoreValve-EvolutR prosthetic (TAVR) valve present in the aortic position.  Procedure Date: September 2018. Echo findings are  consistent with normal structure and function of the aortic valve  prosthesis.  7. The inferior vena cava is dilated in size with <50% respiratory  variability, suggesting right atrial pressure of 15 mmHg.   Patient Profile     84 y.o. female with CAD (DES to LAD and RCA in 2018), history of severe AS status post TAVR (Medtronic evolut September 2018),bilateral carotid artery disease status post right TCAR (October 2020), LBBB, chronic heart failure with mildly reduced left ventricular ejection fraction (EF 45%), essential hypertension, hyperlipidemia, type 2 diabetes mellitus, morbid obesity, presenting with new onset atrial fibrillation rapid ventricular response and heart failure exacerbation.  Assessment & Plan    1.  Acute on chronic combined heart failure with LVEF 40 to 45%, mildly reduced RV contraction, and moderate pulmonary hypertension.  She has improved with diuresis, converted from IV to oral regimen.  Currently on Lasix 60 mg daily with potassium supplement.  Net output of 1600 cc last 24 hours.  Follow-up chest x-ray yesterday showed  improved interstitial edema with trace pleural effusions.  2.  Hypoxic respiratory failure in the setting of problem #1.  Still with oxygen requirement, this has been set up for home use and she has equipment in her room.  Home health also set up by case manager.  3.  CKD stage IIIb, creatinine down to 1.41 and in baseline range.  4.  History of severe aortic stenosis status post TAVR in 2018.  5.  CAD status post previous DES intervention to the LAD and RCA in 2018.  No active angina at this time.  6.  Persistent atrial fibrillation, newly documented.  Heart rate is controlled currently on carvedilol.  She is on Eliquis for stroke prophylaxis.  CHA2DS2-VASc score is 7.  Possibility of outpatient cardioversion to be considered in the next 3 to 4 weeks depending on clinical stability.  Could potentially be treated as permanent atrial fibrillation.  Anticipate discharge home today with supplemental oxygen and home health as per case manager assistance.  Continue Eliquis, Coreg, Lasix with potassium supplement, and Crestor.  Follow-up with Dr. Sallyanne Kuster or APP in 7 days with BMET.  May be able to initiate low-dose hydralazine/nitrate +/- Aldatone going forward or potentially even low-dose ARB depending on stability in renal function.  Signed, Rozann Lesches, MD  11/28/2019, 11:03 AM

## 2019-11-28 NOTE — Progress Notes (Signed)
Pt c/o SOB at rest on 2L [place]. O2 sat 87% upon arrival to pt's room. Lung sounds clear, but diminished at the bases. Pt pulled up in bed. Pt states mild improvement in respiratory status. O2 sat = 94-95% as this RN left the room. Primary RN made aware.

## 2019-11-28 NOTE — Discharge Summary (Signed)
Discharge Summary    Patient ID: Carrie Monroe MRN: [id number]; DOB: [birth year]  Admit date: 11/21/2019 Discharge date: 11/28/2019  Primary Care Provider: Marton Redwood, MD  Primary Cardiologist: Sanda Klein, MD  Primary Electrophysiologist:  None   Discharge Diagnoses    Principal Problem:   Atrial fibrillation with rapid ventricular response [facility]) Active Problems:   Acute on chronic heart failure with midrange ejection fraction, NYHA class 3 ([place])   Hypoxia   Diabetes mellitus type 2 in obese [facility])   Essential hypertension   Chronic renal insufficiency, stage III (moderate)   Three-vessel CAD-PCI to LAD and LCx, med management of RCA   S/P TAVR (transcatheter aortic valve replacement)   Dyslipidemia  Diagnostic Studies/Procedures    2D Echocardiogram 6.27.2021   1. Left ventricular ejection fraction, by estimation, is 40 to 45%. The  left ventricle has mildly decreased function. The left ventricle  demonstrates global hypokinesis. There is mild concentric left ventricular  hypertrophy. Left ventricular diastolic  function could not be evaluated.   2. Right ventricular systolic function is mildly reduced. The right  ventricular size is mildly enlarged. There is moderately elevated  pulmonary artery systolic pressure. The estimated right ventricular  systolic pressure is 37.9 mmHg.   3. Left atrial size was severely dilated.   4. Right atrial size was severely dilated.   5. The mitral valve is normal in structure. Mild to moderate mitral valve  regurgitation.   6. The aortic valve has been repaired/replaced. Aortic valve  regurgitation is not visualized. There is a 26 mm Medtronic  CoreValve-EvolutR prosthetic (TAVR) valve present in the aortic position.  Procedure Date: September 2018. Echo findings are  consistent with normal structure and function of the aortic valve  prosthesis.   7. The inferior vena cava is dilated in size with <50% respiratory    variability, suggesting right atrial pressure of 15 mmHg.  _____________   History of Present Illness     Carrie Monroe is a 84 y.o. female with a history of coronary artery disease status post prior PCI and RCA stenting in August 2018, severe aortic stenosis status post TAVR in September 0240 (complicated by severe groin hematoma and hemorrhagic shock), carotid arterial disease, left bundle branch block, HFmrEF, hypertension, hyperlipidemia, type 2 diabetes mellitus, stage III chronic kidney disease, and obesity.  She presented to [facility] on November 21, 2019 with a 1 to 2-week history of worsening dyspnea, edema, and 14 pound weight gain.  This is occurred despite doubling her home Lasix dose for several days.  Upon arrival, she was found to be atrial fibrillation with rapid ventricular response.  She was placed on intravenous diltiazem and also started on oral Eliquis in the emergency department.  Hospital Course     Consultants: None   Patient was admitted to the cardiology service initially maintained on diltiazem therapy.  In the setting of volume overload, she was placed on intravenous Lasix.  With initiation of Eliquis, prior home doses of aspirin Plavix were discontinued.  Echocardiogram was carried out on June 27 and showed persistent, mild LV dysfunction with an EF of 40-45%.  She had significantly elevated RVSP at 61 mmHg.  Severe biatrial enlargement was noted.  Aortic valve prosthesis was functioning normally.  She responded well to intravenous Lasix and creatinine which was initially 1.67 on June 26 initially improved but then bumped to 1.78 on June 29.  IV Lasix was transitioned to oral Lasix at 60 mg daily however, she continued  to experience hypoxia requiring supplemental oxygen.  A follow-up chest x-ray on June 30 continue to show mild interstitial edema and she required additional IV Lasix through 7/2 (follow-up chest x-ray July 2 did show improving heart failure).  She has  continued to require supplemental oxygen in the setting of desaturations to 83% with ambulation and our transitions of care team arranged for home O2 as well as home health.  [name] has remained in rate controlled atrial fibrillation with rates predominantly in the 80s.  She was transitioned off of intravenous diltiazem onto oral carvedilol early in her admission and this has been titrated to 25 mg twice daily.  As noted above, she has been anticoagulated with Eliquis 2.5 mg twice daily in the setting of age of 73 and creatinine trending greater than 1.5 this admission (though she is 1.41 this morning).  She has been seen by physical therapy who has recommended skilled nursing versus home health physical therapy.  Pt and family have opted for HHPT.  In that setting, she will be discharged home today.  We will arrange for a follow-up appt within the next week at which point we can consider adding bidil +/- spironolactone to her regimen.  We will also consider elective DCCV after at least three weeks of therapeutic oral anticoagulation.  Did the patient have an acute coronary syndrome (MI, NSTEMI, STEMI, etc) this admission?:  No                               Did the patient have a percutaneous coronary intervention (stent / angioplasty)?:  No.   _____________  Discharge Vitals Blood pressure 131/88, pulse 87, temperature 98 F (36.7 C), temperature source Oral, resp. rate 18, height 5\' 2"  (1.575 m), weight 99.9 kg, SpO2 95 %.  Filed Weights   11/26/19 0541 11/27/19 0550 11/28/19 0434  Weight: 103.1 kg 100.6 kg 99.9 kg    Labs & Radiologic Studies    CBC Lab Results  Component Value Date   WBC 6.7 11/22/2019   HGB 10.5 (L) 11/22/2019   HCT 32.6 (L) 11/22/2019   MCV 100.6 (H) 11/22/2019   PLT 155 53/97/6734    Basic Metabolic Panel Recent Labs    11/27/19 0855 11/28/19 0458  NA 141 140  K 3.4* 3.6  CL 102 101  CO2 30 31  GLUCOSE 176* 130*  BUN 47* 51*  CREATININE 1.47* 1.41*    CALCIUM 9.2 9.1  MG  --  1.8   Liver Function Tests Lab Results  Component Value Date   ALT 15 11/21/2019   AST 17 11/21/2019   ALKPHOS 89 11/21/2019   BILITOT 0.9 11/21/2019     Hemoglobin A1C Lab Results  Component Value Date   HGBA1C 5.4 11/21/2019    Fasting Lipid Panel Lab Results  Component Value Date   CHOL 98 11/22/2019   HDL 33 (L) 11/22/2019   LDLCALC 52 11/22/2019   TRIG 67 11/22/2019   CHOLHDL 3.0 11/22/2019    Thyroid Function Tests Lab Results  Component Value Date   TSH 1.983 11/21/2019    _____________  DG Chest 2 View  Result Date: 11/21/2019 CLINICAL DATA:  Increased shortness of breath over the past 2 days with a 14 lb weight gain over the last 4 days. EXAM: CHEST - 2 VIEW COMPARISON:  04/27/2019 FINDINGS: Stable enlarged cardiac silhouette and aortic valve stent. Stable mild prominence of the pulmonary vasculature and  interstitial markings and mild diffuse peribronchial thickening. The lungs remain mildly hyperexpanded. Diffuse osteopenia and mild thoracic spine degenerative changes. Bilateral shoulder degenerative changes. IMPRESSION: Stable cardiomegaly, mild pulmonary vascular congestion and mild changes of COPD and chronic bronchitis. Electronically Signed   By: Claudie Revering M.D.   On: 11/21/2019 12:48   DG CHEST PORT 1 VIEW  Result Date: 11/27/2019 CLINICAL DATA:  Shortness of breath. EXAM: PORTABLE CHEST 1 VIEW COMPARISON:  11/25/2019.  CT 01/10/2017. FINDINGS: Prior cardiac valve repair. Stable cardiomegaly. Bilateral interstitial prominence, slightly improved from prior exam suggesting improving interstitial edema. A component of underlying chronic interstitial lung disease may be present. Tiny pleural effusions cannot be excluded. No pneumothorax. Large calcific density is noted over the left chest most consistent with fibroadenoma. This is stable from prior CT of 01/10/2017. Degenerative change thoracic spine. IMPRESSION: Prior cardiac valve  replacement. Cardiomegaly. Bilateral interstitial prominence again noted, improved from prior exam. Findings suggest improving CHF. A component of underlying chronic interstitial lung disease can not be excluded. Tiny bilateral pleural effusions cannot be excluded. Electronically Signed   By: Marcello Moores  Register   On: 11/27/2019 12:06   DG CHEST PORT 1 VIEW  Result Date: 11/25/2019 CLINICAL DATA:  Dyspnea and hypoxia EXAM: PORTABLE CHEST 1 VIEW COMPARISON:  11/21/2019 FINDINGS: Similar interstitial prominence. Increased patchy density at the lung bases. Probable small pleural effusions. No pneumothorax. Stable cardiomediastinal contours. IMPRESSION: Similar stitched or prominence likely reflecting combination of chronic changes and mild interstitial edema. Increased patchy atelectasis/consolidation at the lung bases. Probable small pleural effusions. Electronically Signed   By: Macy Mis M.D.   On: 11/25/2019 12:40   Disposition   Pt is being discharged home today in good condition.  Follow-up Plans & [place], [facility] [place] [facility] Follow up.   Specialty: [facility] Why: HHRN,HHPT Contact information: [address] [place] [address] [facility] Follow up.   Why: oxygen Contact information: [place] [address] [phone]        Croitoru, Dani Gobble, MD Follow up in 1 week(s).   Specialty: Cardiology Why: We will arrange for a follow-up appointment and contact you. Contact information: [address] [place] [address] [phone]              Discharge Instructions    (HEART FAILURE PATIENTS) Call MD:  Anytime you have any of the following symptoms: 1) 3 pound weight gain in 24 hours or 5 pounds in 1 week 2) shortness of breath, with or without a dry hacking cough 3) swelling in the hands, feet or stomach 4) if you have to sleep on  extra pillows at night in order to breathe.   Complete by: As directed    Call MD for:  difficulty breathing, headache or visual disturbances   Complete by: As directed    Diet - low sodium heart healthy   Complete by: As directed    Increase activity slowly   Complete by: As directed       Discharge Medications   Allergies as of 11/28/2019      Reactions   Oxycodone Other (See Comments)   Hallunication      Medication List    STOP taking these medications   amLODipine 10 MG tablet Commonly known as: NORVASC   aspirin EC 81 MG tablet   clopidogrel  75 MG tablet Commonly known as: Plavix     TAKE these medications   apixaban 2.5 MG Tabs tablet Commonly known as: ELIQUIS Take 1 tablet (2.5 mg total) by mouth 2 (two) times daily.   carvedilol 25 MG tablet Commonly known as: COREG Take 1 tablet (25 mg total) by mouth 2 (two) times daily with a meal.   colchicine 0.6 MG tablet Take 0.6 mg by mouth daily as needed (gout flare up).   COLLAGEN PO Take 5 g by mouth daily. power   furosemide 40 MG tablet Commonly known as: LASIX Take 1.5 tablets (60 mg total) by mouth daily. What changed: See the new instructions.   hydrALAZINE 50 MG tablet Commonly known as: APRESOLINE Take 25 mg by mouth See admin instructions. Only [place] as needed if the top number of your blood pressure is greater than 160   magnesium oxide 400 (241.3 Mg) MG tablet Commonly known as: MAG-OX Take 1 tablet (400 mg total) by mouth daily. Start taking on: November 29, 2019   rosuvastatin 10 MG tablet Commonly known as: CRESTOR Take 10 mg by mouth daily.   VITAMIN B 12 PO Take 1 mL by mouth daily. liquid   vitamin C 1000 MG tablet Take 1,000 mg by mouth daily.   VITAMIN D PO Take 2,000 Units by mouth daily.   zinc sulfate 220 (50 Zn) MG capsule Take 220 mg by mouth daily.            Durable Medical Equipment  (From admission, onward)         Start     Ordered   11/27/19 1653  For home  use only DME oxygen  Once       Question Answer Comment  Length of Need 6 Months   Mode or (Route) Nasal cannula   Liters per Minute 2   Frequency Continuous (stationary and portable oxygen unit needed)   Oxygen conserving device Yes   Oxygen delivery system Gas      11/27/19 1652            Outstanding Labs/Studies   F/u BMET at office follow-up in 1 week.  Follow-up CBC in 30 days given new start of Eliquis.  Duration of Discharge Encounter   Greater than 30 minutes including physician time.  Signed, Murray Hodgkins, NP 11/28/2019, 12:57 PM

## 2019-11-28 NOTE — Telephone Encounter (Signed)
error 

## 2019-11-28 NOTE — Evaluation (Signed)
Physical Therapy Evaluation Patient Details Name: Carrie Monroe MRN: [id number] DOB: [birth year] Today's Date: 11/28/2019   History of Present Illness  Pt is an 84 yo female presenting with 1-2 weeks of progressive dyspnea, edema, and weight gain. Upon admission, workup revealed new onset afib with RVR with acute on chronic CHF. 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  Clinical Impression  Pt in bed upon arrival of PT, agreeable to evaluation at this time. Prior to admission the pt was living alone, independent with mobility and ADLs within the home, and using a RW for community ambulation. The pt now presents with limitations in functional mobility, stability, and increased need for supplemental O2 due to above dx, and will continue to benefit from skilled PT to address these deficits. The pt was only able to complete 2 short bouts of ambulation in her room (55ft and 54ft) before needing seated rest break due to onset of dizziness and fatigue. The pt reports sig concerns about being able to manage new supplemental O2 at home in addition to use of AD. The pt will continue to benefit from skilled PT to progress functional strength and stability as well as endurance to allow for improved mobility in the home and self care.      Follow Up Recommendations SNF;Supervision/Assistance - 24 hour (HHPT if not SNF, pt will need supervision/assist for management of O2 and states her children cannot visit often as they all work)    Optician, dispensing   (trial rollator)    Recommendations for Other Services       Precautions / Restrictions Precautions Precautions: Fall Precaution Comments: watch SpO2 Restrictions Weight Bearing Restrictions: No      Mobility  Bed Mobility Overal bed mobility: Modified Independent             General bed mobility comments: pt OOB in recliner upon arrival  Transfers Overall  transfer level: Needs assistance Equipment used: Rolling walker (2 wheeled);None Transfers: Sit to/from Stand Sit to Stand: Min guard;Min assist         General transfer comment: pt able to stand from recliner with minG with use of RW but requires minA to stedy without AD.  Ambulation/Gait Ambulation/Gait assistance: Min guard Gait Distance (Feet): 8 Feet (+ 5 ft) Assistive device: Rolling walker (2 wheeled) Gait Pattern/deviations: Step-through pattern;Decreased stride length   Gait velocity interpretation: <1.31 ft/sec, indicative of household ambulator General Gait Details: pt reports onset of dizziness with prolonged upright activity, requesting to sit after only 8 ft ambulation in room due to fatigue and dizziness. SpO2 89-96% on 1 L, 95-98% on 2L  Stairs            Wheelchair Mobility    Modified Rankin (Stroke Patients Only)       Balance Overall balance assessment: Needs assistance Sitting-balance support: No upper extremity supported;Feet supported Sitting balance-Leahy Scale: Good Sitting balance - Comments: supervision   Standing balance support: Bilateral upper extremity supported;During functional activity Standing balance-Leahy Scale: Poor Standing balance comment: static stand withsingle UE support, BUE support for movement                             Pertinent Vitals/Pain Pain Assessment: No/denies pain    Home Living Family/patient expects to be discharged to:: Private residence Living Arrangements: Alone Available Help at Discharge: Family;Available PRN/intermittently Type of Home: [place]  Access: Ramped entrance     Home Layout: One level Home Equipment: Cane - single point;Walker - 2 wheels;Shower seat;Bedside commode;Hand held shower head;Wheelchair - manual Additional Comments: pt reports 3 children in area, they plan to visit    Prior Function Level of Independence: Independent         Comments: reports use of cane  for longer outside distance, "touches" furniture when walking in the house     Hand Dominance   Dominant Hand: Right    Extremity/Trunk Assessment   Upper Extremity Assessment Upper Extremity Assessment: Generalized weakness    Lower Extremity Assessment Lower Extremity Assessment: Generalized weakness    Cervical / Trunk Assessment Cervical / Trunk Assessment: Kyphotic  Communication   Communication: No difficulties  Cognition Arousal/Alertness: Awake/alert Behavior During Therapy: WFL for tasks assessed/performed Overall Cognitive Status: Within Functional Limits for tasks assessed                                 General Comments: Pt generally functional, good awareness of deficits and impact on safety at home      General Comments General comments (skin integrity, edema, etc.): VSS on 1-2 L O2. Pt had been anticipating home, but is concerned about going home alone and having to manage O2 and AD as she is no longer able to ambulate without AD as she was PTA. Pt agreeable to short stint SNF    Exercises     Assessment/Plan    PT Assessment Patient needs continued PT services  PT Problem List Decreased strength;Decreased mobility;Decreased activity tolerance;Cardiopulmonary status limiting activity;Decreased balance       PT Treatment Interventions DME instruction;Therapeutic exercise;Gait training;Balance training;Functional mobility training;Therapeutic activities;Patient/family education    PT Goals (Current goals can be found in the Care Plan section)  Acute Rehab PT Goals Patient Stated Goal: reduce need for O2 so she can return home alone PT Goal Formulation: With patient Time For Goal Achievement: 12/12/19 Potential to Achieve Goals: Good    Frequency Min 3X/week (pt may need to progress to HHPT)   Barriers to discharge Decreased caregiver support pt lives home alone, reports children in area but not able to visit often or stay with her        AM-PAC PT "6 Clicks" Mobility  Outcome Measure Help needed turning from your back to your side while in a flat bed without using bedrails?: A Little Help needed moving from lying on your back to sitting on the side of a flat bed without using bedrails?: A Little Help needed moving to and from a bed to a chair (including a wheelchair)?: A Little Help needed standing up from a chair using your arms (e.g., wheelchair or bedside chair)?: A Little Help needed to walk in hospital room?: A Little Help needed climbing 3-5 steps with a railing? : A Little 6 Click Score: 18    End of Session Equipment Utilized During Treatment: Gait belt;Oxygen Activity Tolerance: Patient limited by fatigue;Other (comment) (dizziness) Patient left: in chair;with call bell/phone within reach Nurse Communication: Mobility status (pt need for 24/7 supervision that family cannot provide) PT Visit Diagnosis: Muscle weakness (generalized) (M62.81);Difficulty in walking, not elsewhere classified (R26.2)    Time: 0932-3557 PT Time Calculation (min) (ACUTE ONLY): 34 min   Charges:   PT Evaluation $PT Eval Moderate Complexity: 1 Mod PT Treatments $Gait Training: 8-22 mins        Karma Ganja, PT, DPT  Acute Rehabilitation Department Pager #: [phone]  Otho Bellows 11/28/2019, 12:44 PM

## 2019-12-03 ENCOUNTER — Telehealth: Payer: Self-pay | Admitting: Cardiovascular Disease

## 2019-12-03 MED ORDER — CARVEDILOL 25 MG PO TABS: ORAL_TABLET | ORAL | 6 refills | Status: DC

## 2019-12-03 NOTE — Telephone Encounter (Signed)
New message   Per [facility] would like a call back in reference to patient's medication  being changed. Please call to discuss.

## 2019-12-03 NOTE — Telephone Encounter (Signed)
The patient has been made aware and verbalized her understanding. She will call back with an update if this does not help.

## 2019-12-03 NOTE — Telephone Encounter (Signed)
Let's reduce the carvedilol dose to 12.5 mg in AM and 25 mg in PM and see if that helps, please.

## 2019-12-03 NOTE — Telephone Encounter (Signed)
Returned the call to [employer] with [employer] She stated that when she checked on the patient, post hospital discharge, that the patient has been having episodes of being dizzy and lightheaded. She did state that the patient's vitals have been stable. Blood pressure today was 135/81  She presented to [facility] on November 21, 2019 with a 1 to 2-week history of worsening dyspnea, edema, and 14 pound weight gain. This is occurred despite doubling her home Lasix dose for several days.  Upon arrival, she was found to be atrial fibrillation with rapid ventricular response.  She was placed on intravenous diltiazem and also started on oral Eliquis in the emergency department. She has been seen by physical therapy who has recommended skilled nursing versus home health physical therapy.  Pt and family have opted for HHPT.  In that setting, she will be discharged home today.  We will arrange for a follow-up appt within the next week at which point we can consider adding bidil +/- spironolactone to her regimen.  We will also consider elective DCCV after at least three weeks of therapeutic oral anticoagulation.  Spoke with the patient and she stated that she started feeling poorly when she started the Cardizem 25 mg twice daily in the hospital. She feels dizzy, foggy headed and has no energy. She stated that she typically starts to feel better an hour before it is time for her nightly medications (arounsd 8 pm).  She has a follow up with Dr. Sallyanne Kuster on 7/15. She has been advised that we may be able to see her on Monday 7/12 but she needs to check and see if she can get a ride.

## 2019-12-07 ENCOUNTER — Telehealth: Payer: Self-pay | Admitting: Cardiovascular Disease

## 2019-12-07 NOTE — Telephone Encounter (Signed)
Noted-thank you.  The patient has been advised to call if anything was needed.

## 2019-12-07 NOTE — Telephone Encounter (Signed)
New Message"    Carrie Monroe called and wanted Dr C to know she had an emergency today. She will not be able to evaluate patient today. She said she will evaluate her next week.

## 2019-12-10 ENCOUNTER — Other Ambulatory Visit: Payer: Self-pay

## 2019-12-10 ENCOUNTER — Ambulatory Visit: Payer: Medicare Other | Admitting: Cardiovascular Disease

## 2019-12-10 ENCOUNTER — Encounter: Payer: Self-pay | Admitting: Cardiovascular Disease

## 2019-12-10 VITALS — BP 108/71 | HR 90 | Ht 63.0 in | Wt 216.2 lb

## 2019-12-10 DIAGNOSIS — I5042 Chronic combined systolic (congestive) and diastolic (congestive) heart failure: Secondary | ICD-10-CM

## 2019-12-10 DIAGNOSIS — E7800 Pure hypercholesterolemia, unspecified: Secondary | ICD-10-CM | POA: Diagnosis not present

## 2019-12-10 DIAGNOSIS — Z952 Presence of prosthetic heart valve: Secondary | ICD-10-CM

## 2019-12-10 DIAGNOSIS — I4819 Other persistent atrial fibrillation: Secondary | ICD-10-CM

## 2019-12-10 DIAGNOSIS — I2510 Atherosclerotic heart disease of native coronary artery without angina pectoris: Secondary | ICD-10-CM | POA: Diagnosis not present

## 2019-12-10 NOTE — Patient Instructions (Addendum)
Medication Instructions:  No changes *If you need a refill on your cardiac medications before your next appointment, please call your pharmacy*   Lab Work: Your provider would like for you to have the following labs today: BMET and CBC  If you have labs (blood work) drawn today and your tests are completely normal, you will receive your results only by: Marland Kitchen MyChart Message (if you have MyChart) OR . A paper copy in the mail If you have any lab test that is abnormal or we need to change your treatment, we will call you to review the results.  Follow-Up: At [facility], you and your health needs are our priority.  As part of our continuing mission to provide you with exceptional heart care, we have created designated Provider Care Teams.  These Care Teams include your primary Cardiologist (physician) and Advanced Practice Providers (APPs -  Physician Assistants and Nurse Practitioners) who all work together to provide you with the care you need, when you need it.  We recommend signing up for the patient portal called "MyChart".  Sign up information is provided on this After Visit Summary.  MyChart is used to connect with patients for Virtual Visits (Telemedicine).  Patients are able to view lab/test results, encounter notes, upcoming appointments, etc.  Non-urgent messages can be sent to your provider as well.   To learn more about what you can do with MyChart, go to [email or web address].     Other Instructions  You are scheduled for a Cardioversion on 12/15/19 with Dr. Debara Pickett.  Please arrive at the [facility] (Main Entrance A) at [facility]: [address] [place], [place] [address] at 7:30 am (1 hour prior to procedure)  DIET: Nothing to eat or drink after midnight except a sip of water with medications (see medication instructions below)  Medication Instructions: Hold furosemide the morning of the procedure  Continue your anticoagulant: Eliquis You will need to continue  your anticoagulant after your procedure until you  are told by your provider that it is safe to stop   Labs:  Lab work today  You will need to have the coronavirus test completed prior to your procedure. An appointment has been made at 11:55 am on 12/11/19. This is a Drive Up Visit at the [employer] [address] Someone will direct you to the appropriate testing line. Please tell them that you are there for procedure testing. Stay in your car and someone will be with you shortly. Please make sure to have all other labs completed before this test because you will need to stay quarantined until your procedure.   You must have a responsible person to drive you home and stay in the waiting area during your procedure. Failure to do so could result in cancellation.  Bring your insurance cards.  *Special Note: Every effort is made to have your procedure done on time. Occasionally there are emergencies that occur at the hospital that may cause delays. Please be patient if a delay does occur.
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
## 2019-12-11 ENCOUNTER — Other Ambulatory Visit (HOSPITAL_COMMUNITY)
Admission: RE | Admit: 2019-12-11 | Discharge: 2019-12-11 | Disposition: A | Discharge status: Home / Self Care | Payer: Medicare Other | Source: Ambulatory Visit | Attending: Internal Medicine | Admitting: Internal Medicine

## 2019-12-11 ENCOUNTER — Encounter: Payer: Self-pay | Admitting: *Deleted

## 2019-12-11 DIAGNOSIS — Z01818 Encounter for other preprocedural examination: Secondary | ICD-10-CM | POA: Insufficient documentation

## 2019-12-11 DIAGNOSIS — Z20822 Contact with and (suspected) exposure to covid-19: Secondary | ICD-10-CM | POA: Insufficient documentation

## 2019-12-11 LAB — CBC
Hematocrit: 30.8 % — ABNORMAL LOW (ref 34.0–46.6)
Hemoglobin: 10.3 g/dL — ABNORMAL LOW (ref 11.1–15.9)
MCH: 32.5 pg (ref 26.6–33.0)
MCHC: 33.4 g/dL (ref 31.5–35.7)
MCV: 97 fL (ref 79–97)
Platelets: 97 10*3/uL — CL (ref 150–450)
RBC: 3.17 x10E6/uL — ABNORMAL LOW (ref 3.77–5.28)
RDW: 12.9 % (ref 11.7–15.4)
WBC: 3.4 10*3/uL (ref 3.4–10.8)

## 2019-12-11 LAB — BASIC METABOLIC PANEL
BUN/Creatinine Ratio: 21 (ref 12–28)
BUN: 28 mg/dL — ABNORMAL HIGH (ref 8–27)
CO2: 28 mmol/L (ref 20–29)
Calcium: 9 mg/dL (ref 8.7–10.3)
Chloride: 101 mmol/L (ref 96–106)
Creatinine, Ser: 1.36 mg/dL — ABNORMAL HIGH (ref 0.57–1.00)
GFR calc Af Amer: 42 mL/min/{1.73_m2} — ABNORMAL LOW (ref >59)
GFR calc non Af Amer: 37 mL/min/{1.73_m2} — ABNORMAL LOW (ref >59)
Glucose: 107 mg/dL — ABNORMAL HIGH (ref 65–99)
Potassium: 4.4 mmol/L (ref 3.5–5.2)
Sodium: 141 mmol/L (ref 134–144)

## 2019-12-11 LAB — SARS CORONAVIRUS 2 (TAT 6-24 HRS): SARS Coronavirus 2: NEGATIVE

## 2019-12-13 ENCOUNTER — Telehealth: Payer: Self-pay | Admitting: Physician Assistant

## 2019-12-13 NOTE — Telephone Encounter (Signed)
Mrs. Broadus John paged after our answering service complaining of runny nose, sore throat and cough.  She has upcoming cardioversion scheduled on 12/15/2019.  I recommended some over-the-counter medication as long as she avoid pseudoephedrine and Afrin which may increase heart rate.  If her cold-like symptom improved tomorrow, I think she can proceed with the cardioversion.  However if she continued to cough tomorrow, she will need to give [place] a call back and cancel or push back on the cardioversion.

## 2019-12-14 ENCOUNTER — Telehealth: Payer: Self-pay | Admitting: Cardiovascular Disease

## 2019-12-14 NOTE — Telephone Encounter (Signed)
Spoke with pt, she continues to cough and wants to post pone DCCV until her cough has calmed down. Referred patient to her medical doctor but she declined and said it was just a cold. She will call back to reschedule.

## 2019-12-14 NOTE — Telephone Encounter (Signed)
    Pt would to speak with a nurse regarding rescheduling her cardioversion

## 2019-12-15 ENCOUNTER — Encounter (HOSPITAL_COMMUNITY): Admission: RE | Payer: Self-pay | Source: Home / Self Care

## 2019-12-15 ENCOUNTER — Ambulatory Visit (HOSPITAL_COMMUNITY): Admission: RE | Admit: 2019-12-15 | Payer: Medicare Other | Source: Home / Self Care | Admitting: Internal Medicine

## 2019-12-15 ENCOUNTER — Telehealth: Payer: Self-pay | Admitting: Cardiovascular Disease

## 2019-12-15 SURGERY — CARDIOVERSION
Anesthesia: General

## 2019-12-15 NOTE — Telephone Encounter (Signed)
Carrie Monroe is calling requesting to reschedule her cardioversion that was supposed to be performed today. Please advise.

## 2019-12-15 NOTE — Telephone Encounter (Signed)
Will route to primary nurse to advise if this is needed, it was cancelled. Thanks!

## 2019-12-17 NOTE — Telephone Encounter (Signed)
Patient calling back to reschedule her cardioversion.

## 2019-12-17 NOTE — Telephone Encounter (Signed)
Returned the call to the patient. The cardioversion has been scheduled for 12/23/19 at 8 am. Covid test has been scheduled for 12/19/19. The patient has been made aware and verbalized her understanding.   You are scheduled for a Cardioversion on 12/23/19 with Dr. Sallyanne Kuster.  Please arrive at the [facility] (Main Entrance A) at [facility]: [address] [place], [place] [address] at 7 am. (1 hour prior to procedure)  DIET: Nothing to eat or drink after midnight except a sip of water with medications (see medication instructions below)  Medication Instructions: Hold Furosemide  Continue your anticoagulant: Eliquis You will need to continue your anticoagulant after your procedure until you are told by your provider that it is safe to stop   You must have a responsible person to drive you home and stay in the waiting area during your procedure. Failure to do so could result in cancellation.  Bring your insurance cards.  *Special Note: Every effort is made to have your procedure done on time. Occasionally there are emergencies that occur at the hospital that may cause delays. Please be patient if a delay does occur.

## 2019-12-19 ENCOUNTER — Other Ambulatory Visit (HOSPITAL_COMMUNITY)
Admission: RE | Admit: 2019-12-19 | Discharge: 2019-12-19 | Disposition: A | Discharge status: Home / Self Care | Payer: Medicare Other | Source: Ambulatory Visit | Attending: Cardiovascular Disease | Admitting: Cardiovascular Disease

## 2019-12-19 DIAGNOSIS — Z20822 Contact with and (suspected) exposure to covid-19: Secondary | ICD-10-CM | POA: Insufficient documentation

## 2019-12-19 DIAGNOSIS — Z01812 Encounter for preprocedural laboratory examination: Secondary | ICD-10-CM | POA: Diagnosis present

## 2019-12-19 LAB — SARS CORONAVIRUS 2 (TAT 6-24 HRS): SARS Coronavirus 2: NEGATIVE

## 2019-12-21 ENCOUNTER — Other Ambulatory Visit: Payer: Self-pay | Admitting: *Deleted

## 2019-12-22 NOTE — Anesthesia Preprocedure Evaluation (Addendum)
Anesthesia Evaluation  Patient identified by MRN, date of birth, ID band Patient awake    Reviewed: Allergy & Precautions, H&P , NPO status , Patient's Chart, lab work & pertinent test results  Airway Mallampati: II  TM Distance: >3 FB Neck ROM: Full    Dental no notable dental hx. (+) Partial Upper, Partial Lower, Dental Advisory Given   Pulmonary neg pulmonary ROS,    Pulmonary exam normal breath sounds clear to auscultation       Cardiovascular Exercise Tolerance: Good hypertension, Pt. on medications and Pt. on home beta blockers + CAD, + Cardiac Stents and +CHF  + dysrhythmias Atrial Fibrillation + Valvular Problems/Murmurs MR  Rhythm:Irregular Rate:Normal     Neuro/Psych negative neurological ROS  negative psych ROS   GI/Hepatic negative GI ROS, Neg liver ROS,   Endo/Other  diabetes  Renal/GU Renal disease  negative genitourinary   Musculoskeletal  (+) Arthritis , Osteoarthritis,    Abdominal   Peds  Hematology  (+) Blood dyscrasia, anemia ,   Anesthesia Other Findings   Reproductive/Obstetrics negative OB ROS                            Anesthesia Physical Anesthesia Plan  ASA: III  Anesthesia Plan: General   Post-op Pain Management:    Induction: Intravenous  PONV Risk Score and Plan: 3 and Treatment may vary due to age or medical condition  Airway Management Planned: Mask  Additional Equipment:   Intra-op Plan:   Post-operative Plan:   Informed Consent: I have reviewed the patients History and Physical, chart, labs and discussed the procedure including the risks, benefits and alternatives for the proposed anesthesia with the patient or authorized representative who has indicated his/her understanding and acceptance.       Plan Discussed with: CRNA and Surgeon  Anesthesia Plan Comments:        Anesthesia Quick Evaluation

## 2019-12-23 ENCOUNTER — Encounter (HOSPITAL_COMMUNITY)
Admission: RE | Disposition: A | Discharge status: Home / Self Care | Payer: Self-pay | Source: Home / Self Care | Attending: Cardiovascular Disease

## 2019-12-23 ENCOUNTER — Encounter (HOSPITAL_COMMUNITY): Payer: Self-pay | Admitting: Cardiovascular Disease

## 2019-12-23 ENCOUNTER — Ambulatory Visit (HOSPITAL_COMMUNITY): Payer: Medicare Other | Admitting: Anesthesiology

## 2019-12-23 ENCOUNTER — Other Ambulatory Visit: Payer: Self-pay

## 2019-12-23 ENCOUNTER — Ambulatory Visit (HOSPITAL_COMMUNITY)
Admission: RE | Admit: 2019-12-23 | Discharge: 2019-12-23 | Disposition: A | Discharge status: Home / Self Care | Payer: Medicare Other | Attending: Cardiovascular Disease | Admitting: Cardiovascular Disease

## 2019-12-23 DIAGNOSIS — I5042 Chronic combined systolic (congestive) and diastolic (congestive) heart failure: Secondary | ICD-10-CM | POA: Diagnosis not present

## 2019-12-23 DIAGNOSIS — I2510 Atherosclerotic heart disease of native coronary artery without angina pectoris: Secondary | ICD-10-CM | POA: Diagnosis not present

## 2019-12-23 DIAGNOSIS — N189 Chronic kidney disease, unspecified: Secondary | ICD-10-CM | POA: Insufficient documentation

## 2019-12-23 DIAGNOSIS — Z951 Presence of aortocoronary bypass graft: Secondary | ICD-10-CM | POA: Diagnosis not present

## 2019-12-23 DIAGNOSIS — Z6838 Body mass index (BMI) 38.0-38.9, adult: Secondary | ICD-10-CM | POA: Insufficient documentation

## 2019-12-23 DIAGNOSIS — E1122 Type 2 diabetes mellitus with diabetic chronic kidney disease: Secondary | ICD-10-CM | POA: Diagnosis not present

## 2019-12-23 DIAGNOSIS — I447 Left bundle-branch block, unspecified: Secondary | ICD-10-CM | POA: Insufficient documentation

## 2019-12-23 DIAGNOSIS — I4819 Other persistent atrial fibrillation: Secondary | ICD-10-CM | POA: Diagnosis present

## 2019-12-23 DIAGNOSIS — M109 Gout, unspecified: Secondary | ICD-10-CM | POA: Diagnosis not present

## 2019-12-23 DIAGNOSIS — E6609 Other obesity due to excess calories: Secondary | ICD-10-CM | POA: Diagnosis not present

## 2019-12-23 DIAGNOSIS — I130 Hypertensive heart and chronic kidney disease with heart failure and stage 1 through stage 4 chronic kidney disease, or unspecified chronic kidney disease: Secondary | ICD-10-CM | POA: Diagnosis not present

## 2019-12-23 DIAGNOSIS — R001 Bradycardia, unspecified: Secondary | ICD-10-CM | POA: Diagnosis not present

## 2019-12-23 DIAGNOSIS — E785 Hyperlipidemia, unspecified: Secondary | ICD-10-CM | POA: Insufficient documentation

## 2019-12-23 DIAGNOSIS — Z8249 Family history of ischemic heart disease and other diseases of the circulatory system: Secondary | ICD-10-CM | POA: Insufficient documentation

## 2019-12-23 DIAGNOSIS — Z79899 Other long term (current) drug therapy: Secondary | ICD-10-CM | POA: Diagnosis not present

## 2019-12-23 DIAGNOSIS — I6523 Occlusion and stenosis of bilateral carotid arteries: Secondary | ICD-10-CM | POA: Diagnosis not present

## 2019-12-23 DIAGNOSIS — Z7901 Long term (current) use of anticoagulants: Secondary | ICD-10-CM | POA: Insufficient documentation

## 2019-12-23 DIAGNOSIS — Z952 Presence of prosthetic heart valve: Secondary | ICD-10-CM | POA: Insufficient documentation

## 2019-12-23 DIAGNOSIS — Z96653 Presence of artificial knee joint, bilateral: Secondary | ICD-10-CM | POA: Diagnosis not present

## 2019-12-23 DIAGNOSIS — Z833 Family history of diabetes mellitus: Secondary | ICD-10-CM | POA: Insufficient documentation

## 2019-12-23 HISTORY — PX: CARDIOVERSION: SHX1299

## 2019-12-23 LAB — POCT I-STAT, CHEM 8
BUN: 47 mg/dL — ABNORMAL HIGH (ref 8–23)
Calcium, Ion: 1.21 mmol/L (ref 1.15–1.40)
Chloride: 102 mmol/L (ref 98–111)
Creatinine, Ser: 1.6 mg/dL — ABNORMAL HIGH (ref 0.44–1.00)
Glucose, Bld: 146 mg/dL — ABNORMAL HIGH (ref 70–99)
HCT: 30 % — ABNORMAL LOW (ref 36.0–46.0)
Hemoglobin: 10.2 g/dL — ABNORMAL LOW (ref 12.0–15.0)
Potassium: 4 mmol/L (ref 3.5–5.1)
Sodium: 142 mmol/L (ref 135–145)
TCO2: 27 mmol/L (ref 22–32)

## 2019-12-23 SURGERY — CARDIOVERSION
Anesthesia: General

## 2019-12-23 MED ORDER — LIDOCAINE 2% (20 MG/ML) 5 ML SYRINGE
INTRAMUSCULAR | Status: DC | PRN
  Administered 2019-12-23: 60 mg via INTRAVENOUS

## 2019-12-23 MED ORDER — PROPOFOL 10 MG/ML IV BOLUS
INTRAVENOUS | Status: DC | PRN
  Administered 2019-12-23: 40 mg via INTRAVENOUS

## 2019-12-23 MED ORDER — SODIUM CHLORIDE 0.9 % IV SOLN: INTRAVENOUS | Status: DC | PRN

## 2019-12-23 NOTE — Op Note (Signed)
Procedure: Electrical Cardioversion Indications:  Atrial Fibrillation  Procedure Details:  Consent: Risks of procedure as well as the alternatives and risks of each were explained to the (patient/caregiver).  Consent for procedure obtained.  Time Out: Verified patient identification, verified procedure, site/side was marked, verified correct patient position, special equipment/implants available, medications/allergies/relevent history reviewed, required imaging and test results available.  Performed  Patient placed on cardiac monitor, pulse oximetry, supplemental oxygen as necessary.  Sedation given: propofol IV, Dr. Therisa Doyne Pacer pads placed anterior and posterior chest.  Cardioverted 1 time(s).  Cardioversion with synchronized biphasic 200J shock.  Evaluation: Findings: Post procedure EKG shows: sinus bradycardia Complications: None Patient did tolerate procedure well.  Time Spent Directly with the Patient:  30 `minutes   Carrie Monroe 12/23/2019, 8:21 AM

## 2019-12-23 NOTE — Interval H&P Note (Signed)
History and Physical Interval Note:  12/23/2019 8:15 AM  Carrie Monroe  has presented today for surgery, with the diagnosis of AFIB.  The various methods of treatment have been discussed with the patient and family. After consideration of risks, benefits and other options for treatment, the patient has consented to  Procedure(s): CARDIOVERSION (N/A) as a surgical intervention.  [name] history has been reviewed, patient examined, no change in status, stable for surgery.  I have reviewed the patient's chart and labs.  Questions were answered to the patient's satisfaction.     Debi Cousin

## 2019-12-23 NOTE — Anesthesia Postprocedure Evaluation (Signed)
Anesthesia Post Note  Patient: Carrie Monroe  Procedure(s) Performed: CARDIOVERSION (N/A )     Patient location during evaluation: Endoscopy Anesthesia Type: General Level of consciousness: awake and alert Pain management: pain level controlled Vital Signs Assessment: post-procedure vital signs reviewed and stable Respiratory status: spontaneous breathing, nonlabored ventilation and respiratory function stable Cardiovascular status: blood pressure returned to baseline and stable Postop Assessment: no apparent nausea or vomiting Anesthetic complications: no   No complications documented.  Last Vitals:  Vitals:   12/23/19 0830 12/23/19 0840  BP:  (!) 110/44  Pulse:    Resp:    Temp:    SpO2: 96%     Last Pain:  Vitals:   12/23/19 0840  TempSrc:   PainSc: 0-No pain                 Delitha Elms,W. EDMOND

## 2019-12-23 NOTE — Discharge Instructions (Signed)
Electrical Cardioversion Electrical cardioversion is the delivery of a jolt of electricity to restore a normal rhythm to the heart. A rhythm that is too fast or is not regular keeps the heart from pumping well. In this procedure, sticky patches or metal paddles are placed on the chest to deliver electricity to the heart from a device. This procedure may be done in an emergency if:  There is low or no blood pressure as a result of the heart rhythm.  Normal rhythm must be restored as fast as possible to protect the brain and heart from further damage.  It may save a life. This may also be a scheduled procedure for irregular or fast heart rhythms that are not immediately life-threatening. Tell a health care provider about:  Any allergies you have.  All medicines you are taking, including vitamins, herbs, eye drops, creams, and over-the-counter medicines.  Any problems you or family members have had with anesthetic medicines.  Any blood disorders you have.  Any surgeries you have had.  Any medical conditions you have.  Whether you are pregnant or may be pregnant. What are the risks? Generally, this is a safe procedure. However, problems may occur, including:  Allergic reactions to medicines.  A blood clot that breaks free and travels to other parts of your body.  The possible return of an abnormal heart rhythm within hours or days after the procedure.  Your heart stopping (cardiac arrest). This is rare. What happens before the procedure? Medicines  Your health care provider may have you start taking: ? Blood-thinning medicines (anticoagulants) so your blood does not clot as easily. ? Medicines to help stabilize your heart rate and rhythm.  Ask your health care provider about: ? Changing or stopping your regular medicines. This is especially important if you are taking diabetes medicines or blood thinners. ? Taking medicines such as aspirin and ibuprofen. These medicines can  thin your blood. Do not take these medicines unless your health care provider tells you to take them. ? Taking over-the-counter medicines, vitamins, herbs, and supplements. General instructions  Follow instructions from your health care provider about eating or drinking restrictions.  Plan to have someone take you home from the hospital or clinic.  If you will be going home right after the procedure, plan to have someone with you for 24 hours.  Ask your health care provider what steps will be taken to help prevent infection. These may include washing your skin with a germ-killing soap. What happens during the procedure?   An IV will be inserted into one of your veins.  Sticky patches (electrodes) or metal paddles may be placed on your chest.  You will be given a medicine to help you relax (sedative).  An electrical shock will be delivered. The procedure may vary among health care providers and hospitals. What can I expect after the procedure?  Your blood pressure, heart rate, breathing rate, and blood oxygen level will be monitored until you leave the hospital or clinic.  Your heart rhythm will be watched to make sure it does not change.  You may have some redness on the skin where the shocks were given. Follow these instructions at home:  Do not drive for 24 hours if you were given a sedative during your procedure.  Take over-the-counter and prescription medicines only as told by your health care provider.  Ask your health care provider how to check your pulse. Check it often.  Rest for 48 hours after the procedure or   as told by your health care provider.  Avoid or limit your caffeine use as told by your health care provider.  Keep all follow-up visits as told by your health care provider. This is important. Contact a health care provider if:  You feel like your heart is beating too quickly or your pulse is not regular.  You have a serious muscle cramp that does not go  away. Get help right away if:  You have discomfort in your chest.  You are dizzy or you feel faint.  You have trouble breathing or you are short of breath.  Your speech is slurred.  You have trouble moving an arm or leg on one side of your body.  Your fingers or toes turn cold or blue. Summary  Electrical cardioversion is the delivery of a jolt of electricity to restore a normal rhythm to the heart.  This procedure may be done right away in an emergency or may be a scheduled procedure if the condition is not an emergency.  Generally, this is a safe procedure.  After the procedure, check your pulse often as told by your health care provider. This information is not intended to replace advice given to you by your health care provider. Make sure you discuss any questions you have with your health care provider. Document Revised: 12/15/2018 Document Reviewed: 12/15/2018 Elsevier Patient Education  2020 [employer]  

## 2019-12-23 NOTE — Transfer of Care (Signed)
Immediate Anesthesia Transfer of Care Note  Patient: Carrie Monroe  Procedure(s) Performed: CARDIOVERSION (N/A )  Patient Location: Endoscopy Unit  Anesthesia Type:General  Level of Consciousness: drowsy and patient cooperative  Airway & Oxygen Therapy: Patient Spontanous Breathing  Post-op Assessment: Report given to RN and Post -op Vital signs reviewed and stable  Post vital signs: Reviewed and stable  Last Vitals:  Vitals Value Taken Time  BP 128/61 (83)   Temp    Pulse 52   Resp 12   SpO2 96%     Last Pain:  Vitals:   12/23/19 0721  TempSrc: Oral  PainSc: 0-No pain         Complications: No complications documented.

## 2019-12-24 ENCOUNTER — Encounter (HOSPITAL_COMMUNITY): Payer: Self-pay | Admitting: Cardiovascular Disease

## 2019-12-28 ENCOUNTER — Telehealth: Payer: Self-pay | Admitting: Cardiovascular Disease

## 2019-12-28 MED ORDER — CARVEDILOL 12.5 MG PO TABS
ORAL_TABLET | ORAL | 3 refills | Status: DC
Start: 2019-12-28 — End: 2020-02-02

## 2019-12-28 NOTE — Telephone Encounter (Signed)
New Message   STAT if HR is under 50 or over 120 (normal HR is 60-100 beats per minute)  1) What is your heart rate? Hr 86, yesterday hr 40's-50s           Pt says hr today is 74 and says this is the                     highest it has been   2) Do you have a log of your heart rate readings (document readings)? Yes   3) Do you have any other symptoms? Some Dizziness, but she thinks its from her medicine, She says she doesn't feel good. BP 146/89  Please call

## 2019-12-28 NOTE — Telephone Encounter (Signed)
Spoke with the patient and made her aware of medication adjustment per Dr. Sallyanne Kuster. Patient verbalized understanding. Rx has been sent in.

## 2019-12-28 NOTE — Telephone Encounter (Signed)
Please have her reduce her MORNING dose of carvedilol to 6.25 mg and continue to take 12.5 mg in the EVENING. She will probably need a new prescription for the 6.25 mg dose.

## 2019-12-28 NOTE — Telephone Encounter (Signed)
The patient states that her heart rate was in the 40-50s yesterday. She states that she has been feeling dizzy but that she has ongoing dizziness. She reports that she has felt a bit short of breath as well. Denies any other symptoms. She states this morning her heart rate was 86 prior to taking her medications. HR has continued to stay in the 80s. She reports she has been taking Coreg 1/2 tablet (12.5mg ) BID. Advised patient to continue to monitor and let us know if heart rate drops again or if other symptoms develop.

## 2020-01-08 ENCOUNTER — Telehealth: Payer: Self-pay | Admitting: Cardiovascular Disease

## 2020-01-08 NOTE — Telephone Encounter (Signed)
Please order a 24 hour Holter

## 2020-01-08 NOTE — Telephone Encounter (Signed)
Could we get an AFib clinic appt next week, please?

## 2020-01-08 NOTE — Telephone Encounter (Signed)
STAT if HR is under 50 or over 120 (normal HR is 60-100 beats per minute)  1) What is your heart rate? 124  2) Do you have a log of your heart rate readings (document readings)? No  3) Do you have any other symptoms? No

## 2020-01-08 NOTE — Telephone Encounter (Signed)
Spoke to patient Dr.Croitoru's advice given.Afib Clinic appointment scheduled Wed 8/18 at 11:00 [place].

## 2020-01-08 NOTE — Telephone Encounter (Signed)
Spoke with Leory Plowman nurse. Patient reported to this nurse that her HR was irregular - HR dropping into 40s and then coming back up. Currently HR is 124bpm, dropped to 100 then 88 and back to 100. Patient has increased dizziness that comes and goes and feels tired. Denies chest pain and SOB. Her O2 is 92-99%. BP today is 116/88  On 8/2 - she called in for dizziness and low HR. Coreg was changed to 6.25mg  QAM and 12.5mg  QHS. She is compliant with this.   Advised will notify MD. Could look for APP visits or AFib clinic appts if MD thinks is needed. She has f/u in early Sept

## 2020-01-13 ENCOUNTER — Ambulatory Visit (HOSPITAL_COMMUNITY)
Admission: RE | Admit: 2020-01-13 | Discharge: 2020-01-13 | Disposition: A | Discharge status: Home / Self Care | Payer: Medicare Other | Source: Ambulatory Visit | Attending: Physician Assistant | Admitting: Physician Assistant

## 2020-01-13 ENCOUNTER — Other Ambulatory Visit: Payer: Self-pay

## 2020-01-13 VITALS — BP 130/74 | HR 93 | Ht 63.0 in | Wt 216.6 lb

## 2020-01-13 DIAGNOSIS — Z713 Dietary counseling and surveillance: Secondary | ICD-10-CM | POA: Diagnosis not present

## 2020-01-13 DIAGNOSIS — Z87442 Personal history of urinary calculi: Secondary | ICD-10-CM | POA: Diagnosis not present

## 2020-01-13 DIAGNOSIS — Z953 Presence of xenogenic heart valve: Secondary | ICD-10-CM | POA: Diagnosis not present

## 2020-01-13 DIAGNOSIS — E785 Hyperlipidemia, unspecified: Secondary | ICD-10-CM | POA: Diagnosis not present

## 2020-01-13 DIAGNOSIS — E669 Obesity, unspecified: Secondary | ICD-10-CM | POA: Diagnosis not present

## 2020-01-13 DIAGNOSIS — Z8719 Personal history of other diseases of the digestive system: Secondary | ICD-10-CM | POA: Insufficient documentation

## 2020-01-13 DIAGNOSIS — Z955 Presence of coronary angioplasty implant and graft: Secondary | ICD-10-CM | POA: Diagnosis not present

## 2020-01-13 DIAGNOSIS — M109 Gout, unspecified: Secondary | ICD-10-CM | POA: Insufficient documentation

## 2020-01-13 DIAGNOSIS — Z96652 Presence of left artificial knee joint: Secondary | ICD-10-CM | POA: Diagnosis not present

## 2020-01-13 DIAGNOSIS — N189 Chronic kidney disease, unspecified: Secondary | ICD-10-CM | POA: Diagnosis not present

## 2020-01-13 DIAGNOSIS — I2510 Atherosclerotic heart disease of native coronary artery without angina pectoris: Secondary | ICD-10-CM | POA: Insufficient documentation

## 2020-01-13 DIAGNOSIS — R42 Dizziness and giddiness: Secondary | ICD-10-CM | POA: Diagnosis not present

## 2020-01-13 DIAGNOSIS — D6869 Other thrombophilia: Secondary | ICD-10-CM | POA: Diagnosis not present

## 2020-01-13 DIAGNOSIS — I4819 Other persistent atrial fibrillation: Secondary | ICD-10-CM | POA: Diagnosis not present

## 2020-01-13 DIAGNOSIS — Z853 Personal history of malignant neoplasm of breast: Secondary | ICD-10-CM | POA: Diagnosis not present

## 2020-01-13 DIAGNOSIS — Z7901 Long term (current) use of anticoagulants: Secondary | ICD-10-CM | POA: Diagnosis not present

## 2020-01-13 DIAGNOSIS — I130 Hypertensive heart and chronic kidney disease with heart failure and stage 1 through stage 4 chronic kidney disease, or unspecified chronic kidney disease: Secondary | ICD-10-CM | POA: Diagnosis not present

## 2020-01-13 DIAGNOSIS — I495 Sick sinus syndrome: Secondary | ICD-10-CM | POA: Insufficient documentation

## 2020-01-13 DIAGNOSIS — M1990 Unspecified osteoarthritis, unspecified site: Secondary | ICD-10-CM | POA: Diagnosis not present

## 2020-01-13 DIAGNOSIS — E1122 Type 2 diabetes mellitus with diabetic chronic kidney disease: Secondary | ICD-10-CM | POA: Diagnosis not present

## 2020-01-13 DIAGNOSIS — I350 Nonrheumatic aortic (valve) stenosis: Secondary | ICD-10-CM | POA: Diagnosis not present

## 2020-01-13 DIAGNOSIS — Z6838 Body mass index (BMI) 38.0-38.9, adult: Secondary | ICD-10-CM | POA: Insufficient documentation

## 2020-01-13 DIAGNOSIS — Z79899 Other long term (current) drug therapy: Secondary | ICD-10-CM | POA: Diagnosis not present

## 2020-01-13 DIAGNOSIS — I5042 Chronic combined systolic (congestive) and diastolic (congestive) heart failure: Secondary | ICD-10-CM | POA: Diagnosis not present
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
## 2020-01-19 NOTE — Progress Notes (Signed)
Thanks, Ricky. Waiting on monitor.

## 2020-02-02 ENCOUNTER — Encounter: Payer: Self-pay | Admitting: Cardiovascular Disease

## 2020-02-02 ENCOUNTER — Other Ambulatory Visit: Payer: Self-pay

## 2020-02-02 ENCOUNTER — Ambulatory Visit (INDEPENDENT_AMBULATORY_CARE_PROVIDER_SITE_OTHER): Payer: Medicare Other | Admitting: Cardiovascular Disease

## 2020-02-02 VITALS — BP 96/49 | HR 100 | Ht 62.0 in | Wt 220.0 lb

## 2020-02-02 DIAGNOSIS — I5042 Chronic combined systolic (congestive) and diastolic (congestive) heart failure: Secondary | ICD-10-CM | POA: Diagnosis not present

## 2020-02-02 DIAGNOSIS — N1832 Chronic kidney disease, stage 3b: Secondary | ICD-10-CM

## 2020-02-02 DIAGNOSIS — E7800 Pure hypercholesterolemia, unspecified: Secondary | ICD-10-CM

## 2020-02-02 DIAGNOSIS — I2510 Atherosclerotic heart disease of native coronary artery without angina pectoris: Secondary | ICD-10-CM | POA: Diagnosis not present

## 2020-02-02 DIAGNOSIS — Z952 Presence of prosthetic heart valve: Secondary | ICD-10-CM

## 2020-02-02 DIAGNOSIS — I4819 Other persistent atrial fibrillation: Secondary | ICD-10-CM | POA: Diagnosis not present

## 2020-02-02 MED ORDER — METOPROLOL TARTRATE 50 MG PO TABS
50.0000 mg | ORAL_TABLET | Freq: Two times a day (BID) | ORAL | 3 refills | Status: DC
Start: 2020-02-02 — End: 2020-03-18
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
## 2020-02-02 NOTE — Addendum Note (Signed)
Encounter addended by: Juluis Mire, RN on: 02/02/2020 8:37 AM  Actions taken: Imaging Exam ended

## 2020-02-02 NOTE — Patient Instructions (Signed)
Medication Instructions:  STOP the Carvedilol START Metoprolol Tartrate 50 mg twice daily  *If you need a refill on your cardiac medications before your next appointment, please call your pharmacy*   Lab Work: None ordered If you have labs (blood work) drawn today and your tests are completely normal, you will receive your results only by:  [place] (if you have MyChart) OR  A paper copy in the mail If you have any lab test that is abnormal or we need to change your treatment, we will call you to review the results.   Testing/Procedures: None ordered   Follow-Up: At [facility], you and your health needs are our priority.  As part of our continuing mission to provide you with exceptional heart care, we have created designated Provider Care Teams.  These Care Teams include your primary Cardiologist (physician) and Advanced Practice Providers (APPs -  Physician Assistants and Nurse Practitioners) who all work together to provide you with the care you need, when you need it.  We recommend signing up for the patient portal called "MyChart".  Sign up information is provided on this After Visit Summary.  MyChart is used to connect with patients for Virtual Visits (Telemedicine).  Patients are able to view lab/test results, encounter notes, upcoming appointments, etc.  Non-urgent messages can be sent to your provider as well.   To learn more about what you can do with MyChart, go to [email or web address].    Your next appointment:   Follow up with an APP in 3-4 weeks Follow up in 3 months with Dr. Sallyanne Kuster   Other Instructions Please look into purchasing a [place].

## 2020-02-07 ENCOUNTER — Encounter: Payer: Self-pay | Admitting: Cardiovascular Disease
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
## 2020-02-19 ENCOUNTER — Ambulatory Visit: Payer: Medicare Other | Admitting: General Practice

## 2020-02-19 ENCOUNTER — Encounter: Payer: Self-pay | Admitting: General Practice

## 2020-02-19 ENCOUNTER — Other Ambulatory Visit: Payer: Self-pay

## 2020-02-19 VITALS — BP 110/67 | HR 60

## 2020-02-19 DIAGNOSIS — I2510 Atherosclerotic heart disease of native coronary artery without angina pectoris: Secondary | ICD-10-CM

## 2020-02-19 DIAGNOSIS — I4819 Other persistent atrial fibrillation: Secondary | ICD-10-CM

## 2020-02-19 DIAGNOSIS — I5042 Chronic combined systolic (congestive) and diastolic (congestive) heart failure: Secondary | ICD-10-CM | POA: Diagnosis not present

## 2020-02-19 DIAGNOSIS — E7800 Pure hypercholesterolemia, unspecified: Secondary | ICD-10-CM

## 2020-02-19 NOTE — Patient Instructions (Addendum)
Medication Instructions:  The current medical regimen is effective;  continue present plan and medications as directed. Please refer to the Current Medication list given to you today. *If you need a refill on your cardiac medications before your next appointment, please call your pharmacy*  Lab Work:   Testing/Procedures:  NONE    NONE  Special Instructions PLEASE WEAR COMPRESSION STOCKINGS DAILY AND OFF AT BEDTIME. Compression stockings are elastic socks that squeeze the legs. They help to increase blood flow to the legs and to decrease swelling in the legs from fluid retention, and reduce the chance of developing blood clots in the lower legs. Please put on in the AM when dressing and off at night when dressing for bed. PLEASE MAKE SURE TO ELEVATE YOUR FEET & LEGS WHILE SITTING, THIS WILL HELP WITH THE SWELLING ALSO.  PLEASE READ AND FOLLOW SALTY 6-ATTACHED  FLUID RESTRICTION ONLY 32 OUNCES DAILY OF ALL FLUIDS  TAKE YOUR WEIGHT DAILY AND CALL IF YOUR WEIGHT IS > THAN 3 POUNDS IN ONE DAILY -OR- 5 POUNDS IN ONE WEEK  Follow-Up: Your next appointment:  1 month(s) In Person with Sanda Klein, MD -[place], FNP-C At [facility], you and your health needs are our priority.  As part of our continuing mission to provide you with exceptional heart care, we have created designated Provider Care Teams.  These Care Teams include your primary Cardiologist (physician) and Advanced Practice Providers (APPs -  Physician Assistants and Nurse Practitioners) who all work together to provide you with the care you need, when you need it.

## 2020-02-26 ENCOUNTER — Telehealth: Payer: Self-pay | Admitting: Cardiovascular Disease

## 2020-02-26 NOTE — Telephone Encounter (Signed)
Pt c/o medication issue:  1. Name of Medication: apixaban (ELIQUIS) 2.5 MG TABS tablet  2. How are you currently taking this medication (dosage and times per day)? 1 tablet by mouth two times daily   3. Are you having a reaction (difficulty breathing--STAT)? No   4. What is your medication issue? Pt is calling stating she missed her morning dose and wants to now what to do.

## 2020-02-26 NOTE — Telephone Encounter (Signed)
Returned call to pt she states that she forgot to take her Eliquis this morning. She will take her medication this evening at her usual time to stay on track.

## 2020-03-01 ENCOUNTER — Telehealth: Payer: Self-pay | Admitting: Cardiovascular Disease

## 2020-03-01 NOTE — Telephone Encounter (Signed)
If she is still having swelling I would recommend to take her Lasix 40 mg twice daily for 3 days and then to resume 40 mg daily.  If no improvement after 3 days she should be evaluated for an acute appointment.  [place] T. Audie Box, [place]  [address] [place], [place] [address] [phone]  12:02 PM

## 2020-03-01 NOTE — Telephone Encounter (Signed)
Pt c/o swelling: STAT is pt has developed SOB within 24 hours  1) How much weight have you gained and in what time span? 11 pounds overnight   2) If swelling, where is the swelling located? Legs and hands   3) Are you currently taking a fluid pill? Yes   4) Are you currently SOB? yes  5) Do you have a log of your daily weights (if so, list)? No. Pt has not been feeling well so she has not weighed herself daily  6) Have you gained 3 pounds in a day or 5 pounds in a week? 11 pounds overnight   7) Have you traveled recently? no

## 2020-03-01 NOTE — Telephone Encounter (Signed)
Agree w Dr. Kathalene Frames recc.

## 2020-03-01 NOTE — Telephone Encounter (Signed)
Spoke to patient . Instruction given to  Increase lasix 40 mg twice a day per  Dr Jenetta DownerNori Riis . Instructed patient to take morning dose @8  am  and second dose @2pm  . Starting today ( Tuesday  03/01/20)  .  Patient verbalized understanding.  She is aware to weight daily and call if  No improvement- (weight does not go down) . She verbalized understanding.

## 2020-03-01 NOTE — Telephone Encounter (Signed)
Spoke to patient. She states her weight today is 238 lbs.  She states she does not think she weight yesterday but it was probably this past Sunday -- She thinks her weight was 227 lbs.  she states  She has been having swelling in her feet foe over a month , but notice  Legs and hands are swelling for now.  patient is able to carry on a conversation without being short of breath. Patient is using oxygen 2/liters O2 sat 99%  B/p 104/86.    patient has a housekeeper present . The housekeeper helped patient reweigh herself and weight was  238 lbs by  The scales. Patient states she sleeps in a recliner which is not new but she is  having difficulty the last week with her sleep.   Per patient, she saw  Primary last Thursday Sept 5, 2021 - Dr Brigitte Pulse reduce furosemide from 60 mg  ( 1 and 1/2  Tablet of 40 mg)  To  1 tablet a day ( 40 mg) .   Patient states swelling is about the same as last week .    RN reviewed patient's chart . Last office visit -Weight was not obtained on 02/19/20.  The previous appointment on 02/02/20 weight was 220 lb. The appointment before that on 8/18/281 ( afib clinic) ws 216 lbs.   RN informed patient will discuss with Doctor of the day and send message to Dr Sallyanne Kuster. RN has requested office note from primary . Will contact patient.

## 2020-03-02 ENCOUNTER — Ambulatory Visit: Payer: Medicare Other | Admitting: Student

## 2020-03-02 ENCOUNTER — Emergency Department (HOSPITAL_COMMUNITY): Payer: Medicare Other

## 2020-03-02 ENCOUNTER — Encounter (HOSPITAL_COMMUNITY): Payer: Self-pay | Admitting: Family Medicine

## 2020-03-02 ENCOUNTER — Other Ambulatory Visit: Payer: Self-pay

## 2020-03-02 ENCOUNTER — Inpatient Hospital Stay (HOSPITAL_COMMUNITY)
Admission: EM | Admit: 2020-03-02 | Discharge: 2020-03-18 | DRG: 291 | Disposition: A | Discharge status: Home Health | Payer: Medicare Other | Attending: Student | Admitting: Student

## 2020-03-02 DIAGNOSIS — N179 Acute kidney failure, unspecified: Secondary | ICD-10-CM | POA: Diagnosis present

## 2020-03-02 DIAGNOSIS — R7989 Other specified abnormal findings of blood chemistry: Secondary | ICD-10-CM | POA: Diagnosis not present

## 2020-03-02 DIAGNOSIS — Z20822 Contact with and (suspected) exposure to covid-19: Secondary | ICD-10-CM | POA: Diagnosis present

## 2020-03-02 DIAGNOSIS — Z953 Presence of xenogenic heart valve: Secondary | ICD-10-CM | POA: Diagnosis not present

## 2020-03-02 DIAGNOSIS — Z96652 Presence of left artificial knee joint: Secondary | ICD-10-CM | POA: Diagnosis present

## 2020-03-02 DIAGNOSIS — R778 Other specified abnormalities of plasma proteins: Secondary | ICD-10-CM | POA: Diagnosis not present

## 2020-03-02 DIAGNOSIS — J9611 Chronic respiratory failure with hypoxia: Secondary | ICD-10-CM | POA: Diagnosis not present

## 2020-03-02 DIAGNOSIS — R739 Hyperglycemia, unspecified: Secondary | ICD-10-CM | POA: Diagnosis not present

## 2020-03-02 DIAGNOSIS — I4891 Unspecified atrial fibrillation: Secondary | ICD-10-CM | POA: Diagnosis present

## 2020-03-02 DIAGNOSIS — Z79899 Other long term (current) drug therapy: Secondary | ICD-10-CM

## 2020-03-02 DIAGNOSIS — Z96653 Presence of artificial knee joint, bilateral: Secondary | ICD-10-CM | POA: Diagnosis present

## 2020-03-02 DIAGNOSIS — J9621 Acute and chronic respiratory failure with hypoxia: Secondary | ICD-10-CM | POA: Diagnosis present

## 2020-03-02 DIAGNOSIS — Z7901 Long term (current) use of anticoagulants: Secondary | ICD-10-CM | POA: Diagnosis not present

## 2020-03-02 DIAGNOSIS — L8990 Pressure ulcer of unspecified site, unspecified stage: Secondary | ICD-10-CM | POA: Insufficient documentation

## 2020-03-02 DIAGNOSIS — E875 Hyperkalemia: Secondary | ICD-10-CM | POA: Diagnosis present

## 2020-03-02 DIAGNOSIS — I5043 Acute on chronic combined systolic (congestive) and diastolic (congestive) heart failure: Secondary | ICD-10-CM | POA: Diagnosis present

## 2020-03-02 DIAGNOSIS — Z6838 Body mass index (BMI) 38.0-38.9, adult: Secondary | ICD-10-CM | POA: Diagnosis not present

## 2020-03-02 DIAGNOSIS — I25110 Atherosclerotic heart disease of native coronary artery with unstable angina pectoris: Secondary | ICD-10-CM | POA: Diagnosis not present

## 2020-03-02 DIAGNOSIS — I2510 Atherosclerotic heart disease of native coronary artery without angina pectoris: Secondary | ICD-10-CM | POA: Diagnosis present

## 2020-03-02 DIAGNOSIS — L8942 Pressure ulcer of contiguous site of back, buttock and hip, stage 2: Secondary | ICD-10-CM | POA: Diagnosis not present

## 2020-03-02 DIAGNOSIS — E785 Hyperlipidemia, unspecified: Secondary | ICD-10-CM | POA: Diagnosis present

## 2020-03-02 DIAGNOSIS — L89302 Pressure ulcer of unspecified buttock, stage 2: Secondary | ICD-10-CM | POA: Diagnosis not present

## 2020-03-02 DIAGNOSIS — E876 Hypokalemia: Secondary | ICD-10-CM | POA: Diagnosis present

## 2020-03-02 DIAGNOSIS — I248 Other forms of acute ischemic heart disease: Secondary | ICD-10-CM | POA: Diagnosis present

## 2020-03-02 DIAGNOSIS — N1832 Chronic kidney disease, stage 3b: Secondary | ICD-10-CM | POA: Diagnosis present

## 2020-03-02 DIAGNOSIS — I4819 Other persistent atrial fibrillation: Secondary | ICD-10-CM | POA: Diagnosis present

## 2020-03-02 DIAGNOSIS — Z955 Presence of coronary angioplasty implant and graft: Secondary | ICD-10-CM

## 2020-03-02 DIAGNOSIS — R5381 Other malaise: Secondary | ICD-10-CM | POA: Diagnosis not present

## 2020-03-02 DIAGNOSIS — D6959 Other secondary thrombocytopenia: Secondary | ICD-10-CM | POA: Diagnosis present

## 2020-03-02 DIAGNOSIS — R531 Weakness: Secondary | ICD-10-CM | POA: Diagnosis not present

## 2020-03-02 DIAGNOSIS — Z9049 Acquired absence of other specified parts of digestive tract: Secondary | ICD-10-CM

## 2020-03-02 DIAGNOSIS — M109 Gout, unspecified: Secondary | ICD-10-CM | POA: Diagnosis present

## 2020-03-02 DIAGNOSIS — R001 Bradycardia, unspecified: Secondary | ICD-10-CM | POA: Diagnosis not present

## 2020-03-02 DIAGNOSIS — I447 Left bundle-branch block, unspecified: Secondary | ICD-10-CM | POA: Diagnosis present

## 2020-03-02 DIAGNOSIS — I130 Hypertensive heart and chronic kidney disease with heart failure and stage 1 through stage 4 chronic kidney disease, or unspecified chronic kidney disease: Principal | ICD-10-CM | POA: Diagnosis present

## 2020-03-02 DIAGNOSIS — L89151 Pressure ulcer of sacral region, stage 1: Secondary | ICD-10-CM | POA: Diagnosis present

## 2020-03-02 DIAGNOSIS — D696 Thrombocytopenia, unspecified: Secondary | ICD-10-CM | POA: Diagnosis not present

## 2020-03-02 DIAGNOSIS — D6869 Other thrombophilia: Secondary | ICD-10-CM | POA: Diagnosis present

## 2020-03-02 DIAGNOSIS — E873 Alkalosis: Secondary | ICD-10-CM | POA: Diagnosis not present

## 2020-03-02 DIAGNOSIS — I480 Paroxysmal atrial fibrillation: Secondary | ICD-10-CM | POA: Diagnosis not present

## 2020-03-02 DIAGNOSIS — Z853 Personal history of malignant neoplasm of breast: Secondary | ICD-10-CM | POA: Diagnosis not present

## 2020-03-02 DIAGNOSIS — E1122 Type 2 diabetes mellitus with diabetic chronic kidney disease: Secondary | ICD-10-CM | POA: Diagnosis present

## 2020-03-02 DIAGNOSIS — I5023 Acute on chronic systolic (congestive) heart failure: Secondary | ICD-10-CM | POA: Diagnosis not present

## 2020-03-02 DIAGNOSIS — I9589 Other hypotension: Secondary | ICD-10-CM | POA: Diagnosis not present

## 2020-03-02 DIAGNOSIS — E1165 Type 2 diabetes mellitus with hyperglycemia: Secondary | ICD-10-CM | POA: Diagnosis present

## 2020-03-02 DIAGNOSIS — L8941 Pressure ulcer of contiguous site of back, buttock and hip, stage 1: Secondary | ICD-10-CM | POA: Diagnosis not present

## 2020-03-02 LAB — MAGNESIUM: Magnesium: 2.5 mg/dL — ABNORMAL HIGH (ref 1.7–2.4)

## 2020-03-02 LAB — BASIC METABOLIC PANEL
Anion gap: 11 (ref 5–15)
BUN: 63 mg/dL — ABNORMAL HIGH (ref 8–23)
CO2: 27 mmol/L (ref 22–32)
Calcium: 9.9 mg/dL (ref 8.9–10.3)
Chloride: 102 mmol/L (ref 98–111)
Creatinine, Ser: 2.75 mg/dL — ABNORMAL HIGH (ref 0.44–1.00)
GFR calc non Af Amer: 15 mL/min — ABNORMAL LOW (ref >60)
Glucose, Bld: 153 mg/dL — ABNORMAL HIGH (ref 70–99)
Potassium: 4.2 mmol/L (ref 3.5–5.1)
Sodium: 140 mmol/L (ref 135–145)

## 2020-03-02 LAB — URINALYSIS, ROUTINE W REFLEX MICROSCOPIC
Bilirubin Urine: NEGATIVE
Glucose, UA: NEGATIVE mg/dL
Hgb urine dipstick: NEGATIVE
Ketones, ur: NEGATIVE mg/dL
Leukocytes,Ua: NEGATIVE
Nitrite: NEGATIVE
Protein, ur: NEGATIVE mg/dL
Specific Gravity, Urine: 1.009 (ref 1.005–1.030)
pH: 5 (ref 5.0–8.0)

## 2020-03-02 LAB — CBC
HCT: 43.9 % (ref 36.0–46.0)
Hemoglobin: 13.7 g/dL (ref 12.0–15.0)
MCH: 32 pg (ref 26.0–34.0)
MCHC: 31.2 g/dL (ref 30.0–36.0)
MCV: 102.6 fL — ABNORMAL HIGH (ref 80.0–100.0)
Platelets: 92 10*3/uL — ABNORMAL LOW (ref 150–400)
RBC: 4.28 MIL/uL (ref 3.87–5.11)
RDW: 17.5 % — ABNORMAL HIGH (ref 11.5–15.5)
WBC: 8.1 10*3/uL (ref 4.0–10.5)
nRBC: 0 % (ref 0.0–0.2)

## 2020-03-02 LAB — SODIUM, URINE, RANDOM: Sodium, Ur: 42 mmol/L

## 2020-03-02 LAB — TROPONIN I (HIGH SENSITIVITY): Troponin I (High Sensitivity): 54 ng/L — ABNORMAL HIGH (ref <18)

## 2020-03-02 LAB — BRAIN NATRIURETIC PEPTIDE: B Natriuretic Peptide: 529.1 pg/mL — ABNORMAL HIGH (ref 0.0–100.0)

## 2020-03-02 LAB — RESPIRATORY PANEL BY RT PCR (FLU A&B, COVID)
Influenza A by PCR: NEGATIVE
Influenza B by PCR: NEGATIVE
SARS Coronavirus 2 by RT PCR: NEGATIVE

## 2020-03-02 LAB — CREATININE, URINE, RANDOM: Creatinine, Urine: 44.35 mg/dL

## 2020-03-02 MED ORDER — FUROSEMIDE 10 MG/ML IJ SOLN
40.0000 mg | Freq: Once | INTRAMUSCULAR | Status: AC
  Administered 2020-03-02: 40 mg via INTRAVENOUS
  Filled 2020-03-02: qty 4

## 2020-03-02 MED ORDER — AMIODARONE HCL IN DEXTROSE 360-4.14 MG/200ML-% IV SOLN: 60.0000 mg/h | INTRAVENOUS | Status: DC

## 2020-03-02 MED ORDER — DILTIAZEM LOAD VIA INFUSION
10.0000 mg | Freq: Once | INTRAVENOUS | Status: AC
  Administered 2020-03-02: 10 mg via INTRAVENOUS
  Filled 2020-03-02: qty 10

## 2020-03-02 MED ORDER — DILTIAZEM LOAD VIA INFUSION
10.0000 mg | Freq: Once | INTRAVENOUS | Status: DC
  Filled 2020-03-02: qty 10

## 2020-03-02 MED ORDER — AMIODARONE LOAD VIA INFUSION: 150.0000 mg | Freq: Once | INTRAVENOUS | Status: DC

## 2020-03-02 MED ORDER — AMIODARONE HCL IN DEXTROSE 360-4.14 MG/200ML-% IV SOLN: 30.0000 mg/h | INTRAVENOUS | Status: DC

## 2020-03-02 MED ORDER — DILTIAZEM HCL-DEXTROSE 125-5 MG/125ML-% IV SOLN (PREMIX)
5.0000 mg/h | INTRAVENOUS | Status: DC
  Administered 2020-03-02 – 2020-03-03 (×2): 5 mg/h via INTRAVENOUS
  Filled 2020-03-02: qty 125

## 2020-03-02 MED ORDER — DILTIAZEM HCL-DEXTROSE 125-5 MG/125ML-% IV SOLN (PREMIX)
5.0000 mg/h | INTRAVENOUS | Status: DC
  Filled 2020-03-02: qty 125
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
## 2020-03-02 NOTE — ED Triage Notes (Signed)
EMS arrival from home co fluid retention and exertional SOB. Per EMS SPO2 92% R, with rales to bilateral lower lobes, with 11lb wt gain over 3 days. Per EMS pt doubled lasix for 2 days with no improvement. Pt Afib RVR, 10mg  Cardizem via IV 20G R forearm. 2L O2 SPO2 100%.
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
## 2020-03-03 ENCOUNTER — Encounter (HOSPITAL_COMMUNITY): Payer: Self-pay | Admitting: Family Medicine

## 2020-03-03 DIAGNOSIS — I25110 Atherosclerotic heart disease of native coronary artery with unstable angina pectoris: Secondary | ICD-10-CM

## 2020-03-03 DIAGNOSIS — I5023 Acute on chronic systolic (congestive) heart failure: Secondary | ICD-10-CM | POA: Diagnosis not present

## 2020-03-03 DIAGNOSIS — L8990 Pressure ulcer of unspecified site, unspecified stage: Secondary | ICD-10-CM | POA: Insufficient documentation

## 2020-03-03 DIAGNOSIS — N179 Acute kidney failure, unspecified: Secondary | ICD-10-CM

## 2020-03-03 DIAGNOSIS — I4891 Unspecified atrial fibrillation: Secondary | ICD-10-CM | POA: Diagnosis not present

## 2020-03-03 DIAGNOSIS — N1832 Chronic kidney disease, stage 3b: Secondary | ICD-10-CM

## 2020-03-03 DIAGNOSIS — J9611 Chronic respiratory failure with hypoxia: Secondary | ICD-10-CM | POA: Diagnosis not present

## 2020-03-03 DIAGNOSIS — D696 Thrombocytopenia, unspecified: Secondary | ICD-10-CM

## 2020-03-03 LAB — CBC
HCT: 40.3 % (ref 36.0–46.0)
Hemoglobin: 12.6 g/dL (ref 12.0–15.0)
MCH: 32.1 pg (ref 26.0–34.0)
MCHC: 31.3 g/dL (ref 30.0–36.0)
MCV: 102.8 fL — ABNORMAL HIGH (ref 80.0–100.0)
Platelets: 72 10*3/uL — ABNORMAL LOW (ref 150–400)
RBC: 3.92 MIL/uL (ref 3.87–5.11)
RDW: 17.5 % — ABNORMAL HIGH (ref 11.5–15.5)
WBC: 6.8 10*3/uL (ref 4.0–10.5)
nRBC: 0 % (ref 0.0–0.2)

## 2020-03-03 LAB — BASIC METABOLIC PANEL
Anion gap: 13 (ref 5–15)
BUN: 61 mg/dL — ABNORMAL HIGH (ref 8–23)
CO2: 24 mmol/L (ref 22–32)
Calcium: 9.4 mg/dL (ref 8.9–10.3)
Chloride: 104 mmol/L (ref 98–111)
Creatinine, Ser: 2.49 mg/dL — ABNORMAL HIGH (ref 0.44–1.00)
GFR calc non Af Amer: 17 mL/min — ABNORMAL LOW (ref >60)
Glucose, Bld: 266 mg/dL — ABNORMAL HIGH (ref 70–99)
Potassium: 3.5 mmol/L (ref 3.5–5.1)
Sodium: 141 mmol/L (ref 135–145)

## 2020-03-03 LAB — TROPONIN I (HIGH SENSITIVITY): Troponin I (High Sensitivity): 52 ng/L — ABNORMAL HIGH (ref <18)

## 2020-03-03 MED ORDER — DIGOXIN 125 MCG PO TABS
0.1250 mg | ORAL_TABLET | ORAL | Status: DC
  Administered 2020-03-03 – 2020-03-13 (×6): 0.125 mg via ORAL
  Filled 2020-03-03 (×7): qty 1

## 2020-03-03 MED ORDER — APIXABAN 2.5 MG PO TABS: 2.5000 mg | ORAL_TABLET | Freq: Once | ORAL | Status: DC

## 2020-03-03 MED ORDER — SODIUM CHLORIDE 0.9% FLUSH: 3.0000 mL | INTRAVENOUS | Status: DC | PRN

## 2020-03-03 MED ORDER — AMIODARONE HCL 200 MG PO TABS: 200.0000 mg | ORAL_TABLET | Freq: Every day | ORAL | Status: DC

## 2020-03-03 MED ORDER — AMIODARONE HCL IN DEXTROSE 360-4.14 MG/200ML-% IV SOLN
60.0000 mg/h | INTRAVENOUS | Status: DC
  Administered 2020-03-03: 60 mg/h via INTRAVENOUS
  Filled 2020-03-03: qty 200

## 2020-03-03 MED ORDER — AMIODARONE HCL IN DEXTROSE 360-4.14 MG/200ML-% IV SOLN
30.0000 mg/h | INTRAVENOUS | Status: DC
  Administered 2020-03-03 – 2020-03-06 (×6): 30 mg/h via INTRAVENOUS
  Filled 2020-03-03 (×6): qty 200

## 2020-03-03 MED ORDER — FUROSEMIDE 10 MG/ML IJ SOLN
40.0000 mg | Freq: Two times a day (BID) | INTRAMUSCULAR | Status: DC
  Administered 2020-03-03: 40 mg via INTRAVENOUS
  Filled 2020-03-03: qty 4

## 2020-03-03 MED ORDER — ROSUVASTATIN CALCIUM 5 MG PO TABS
10.0000 mg | ORAL_TABLET | Freq: Every day | ORAL | Status: DC
  Administered 2020-03-03 – 2020-03-18 (×16): 10 mg via ORAL
  Filled 2020-03-03 (×17): qty 2

## 2020-03-03 MED ORDER — FUROSEMIDE 10 MG/ML IJ SOLN
40.0000 mg | Freq: Three times a day (TID) | INTRAMUSCULAR | Status: DC
  Administered 2020-03-03 – 2020-03-04 (×3): 40 mg via INTRAVENOUS
  Filled 2020-03-03 (×3): qty 4

## 2020-03-03 MED ORDER — SODIUM CHLORIDE 0.9 % IV SOLN: 250.0000 mL | INTRAVENOUS | Status: DC | PRN

## 2020-03-03 MED ORDER — POTASSIUM CHLORIDE CRYS ER 20 MEQ PO TBCR
20.0000 meq | EXTENDED_RELEASE_TABLET | Freq: Three times a day (TID) | ORAL | Status: DC
  Administered 2020-03-03 – 2020-03-08 (×16): 20 meq via ORAL
  Filled 2020-03-03 (×16): qty 1

## 2020-03-03 MED ORDER — ACETAMINOPHEN 325 MG PO TABS
650.0000 mg | ORAL_TABLET | ORAL | Status: DC | PRN
  Filled 2020-03-03: qty 2

## 2020-03-03 MED ORDER — ONDANSETRON HCL 4 MG/2ML IJ SOLN: 4.0000 mg | Freq: Four times a day (QID) | INTRAMUSCULAR | Status: DC | PRN

## 2020-03-03 MED ORDER — APIXABAN 2.5 MG PO TABS
2.5000 mg | ORAL_TABLET | Freq: Two times a day (BID) | ORAL | Status: DC
  Administered 2020-03-03 – 2020-03-18 (×31): 2.5 mg via ORAL
  Filled 2020-03-03 (×31): qty 1

## 2020-03-03 MED ORDER — SODIUM CHLORIDE 0.9% FLUSH
3.0000 mL | Freq: Two times a day (BID) | INTRAVENOUS | Status: DC
  Administered 2020-03-03 – 2020-03-17 (×24): 3 mL via INTRAVENOUS

## 2020-03-03 NOTE — ED Notes (Signed)
Report given to [facility]

## 2020-03-03 NOTE — Progress Notes (Signed)
Mobility Specialist - Progress Note   03/03/20 1357  Mobility  Activity Contraindicated/medical hold   Instructed by RN not to see pt today as they have been experiencing SOB and are on an amio drip.    Pricilla Handler Mobility Specialist Mobility Specialist Phone: [phone]

## 2020-03-03 NOTE — Plan of Care (Signed)
Pt admitted to unit via stretcher. A/ox4, on 3L O3. Transferred to bed and provided CHG bath + continence care. Purewick replaced. Skin assessment completed with RN. VS obtained, resp>25<30, yellow MEWS previously. Will continue VS q 4.   Problem: Safety: Goal: Ability to remain free from injury will improve Outcome: Progressing

## 2020-03-03 NOTE — Progress Notes (Addendum)
  Amiodarone Drug - Drug Interaction Consult Note  Recommendations: see info below.   Amiodarone is metabolized by the cytochrome P450 system and therefore has the potential to cause many drug interactions. Amiodarone has an average plasma half-life of 50 days (range 20 to 100 days).   There is potential for drug interactions to occur several weeks or months after stopping treatment and the onset of drug interactions may be slow after initiating amiodarone.   [x]  Statins: Increased risk of myopathy. Simvastatin- restrict dose to 20mg  daily. Other statins: counsel patients to report any muscle pain or weakness immediately.  []  Anticoagulants: Amiodarone can increase anticoagulant effect. Consider warfarin dose reduction. Patients should be monitored closely and the dose of anticoagulant altered accordingly, remembering that amiodarone levels take several weeks to stabilize.  []  Antiepileptics: Amiodarone can increase plasma concentration of phenytoin, the dose should be reduced. Note that small changes in phenytoin dose can result in large changes in levels. Monitor patient and counsel on signs of toxicity.  []  Beta blockers: increased risk of bradycardia, AV block and myocardial depression. Sotalol - avoid concomitant use.  [x]   Calcium channel blockers (diltiazem and verapamil):     increased risk of bradycardia, AV block and myocardial                depression.           -- on IV Diltiazem.  []   Cyclosporine: Amiodarone increases levels of cyclosporine. Reduced dose of cyclosporine is recommended.  [x]  Digoxin dose should be halved when amiodarone is           started.          --  On digoxin 0.125 mg po every other day (new, started low dose 03/03/20 per cardiologist.  [x]  Diuretics: increased risk of cardiotoxicity if hypokalemia      occurs.  []  Oral hypoglycemic agents (glyburide, glipizide, glimepiride): increased risk of hypoglycemia. Patient's glucose levels should be monitored  closely when initiating amiodarone therapy.   []  Drugs that prolong the QT interval:  Torsades de pointes risk may be increased with concurrent use - avoid if possible.  Monitor QTc, also keep magnesium/potassium WNL if concurrent therapy can't be avoided. Marland Kitchen Antibiotics: e.g. fluoroquinolones, erythromycin. . Antiarrhythmics: e.g. quinidine, procainamide,                  disopyramide, sotalol. . Antipsychotics: e.g. phenothiazines, haloperidol.  . Lithium, tricyclic antidepressants, and methadone.  Thank You,  Nicole Cella, RPh Clinical Pharmacist Please check AMION for all [place] phone numbers After 10:00 PM, call [place] [phone]   03/03/2020 9:53 AM

## 2020-03-03 NOTE — Progress Notes (Signed)
Progress Note  Patient Name: Carrie Monroe Date of Encounter: 03/03/2020  [facility] Cardiologist: Sanda Klein, MD   Subjective   Had muscle cramps overnight. Breathing appears comfortable at 30 deg HOB elevation, but she does not feel well. Appears depressed.  Inpatient Medications    Scheduled Meds:  apixaban  2.5 mg Oral BID   furosemide  40 mg Intravenous Q12H   rosuvastatin  10 mg Oral Daily   sodium chloride flush  3 mL Intravenous Q12H   Continuous Infusions:  sodium chloride     diltiazem (CARDIZEM) infusion 5 mg/hr (03/03/20 0825)   PRN Meds: sodium chloride, acetaminophen, ondansetron (ZOFRAN) IV, sodium chloride flush   Vital Signs    Vitals:   03/03/20 0000 03/03/20 0130 03/03/20 0226 03/03/20 0817  BP: 103/68 (!) 96/48 118/79 93/62  Pulse: 84 (!) 103 96 84  Resp: (!) 28 (!) 23 (!) 29 17  Temp:   97.7 F (36.5 C) 97.6 F (36.4 C)  TempSrc:   Oral Oral  SpO2: 98% 97% 95% 96%  Weight:   109.9 kg   Height:   5\' 3"  (1.6 m)     Intake/Output Summary (Last 24 hours) at 03/03/2020 0853 Last data filed at 03/03/2020 0151 Gross per 24 hour  Intake 400 ml  Output 550 ml  Net -150 ml   Last 3 Weights 03/03/2020 02/02/2020 01/13/2020  Weight (lbs) 242 lb 4.6 oz 220 lb 216 lb 9.6 oz  Weight (kg) 109.9 kg 99.791 kg 98.249 kg      Telemetry    AFib with borderline rate control (95-105) at rest - Personally Reviewed  ECG    Afib RVR, LBBB QRS 177 ms, QTc 559 ms - Personally Reviewed  Physical Exam  Obese GEN: No acute distress.   Neck: 10 cm JVD Cardiac: irregular, 1/6 holosystolic LLSB murmur,no diastolic murmurs, rubs, or gallops.  Respiratory: Clear to auscultation bilaterally. GI: Soft, nontender, non-distended  MS: 3-4+ soft pitting calf edema; No deformity. Neuro:  Nonfocal  Psych: Normal affect   Labs    High Sensitivity Troponin:   Recent Labs  Lab 03/02/20 1955 03/03/20 0327  TROPONINIHS 54* 52*       Chemistry Recent Labs  Lab 03/02/20 1936 03/03/20 0327  NA 140 141  K 4.2 3.5  CL 102 104  CO2 27 24  GLUCOSE 153* 266*  BUN 63* 61*  CREATININE 2.75* 2.49*  CALCIUM 9.9 9.4  GFRNONAA 15* 17*  ANIONGAP 11 13     Hematology Recent Labs  Lab 03/02/20 1936 03/03/20 0327  WBC 8.1 6.8  RBC 4.28 3.92  HGB 13.7 12.6  HCT 43.9 40.3  MCV 102.6* 102.8*  MCH 32.0 32.1  MCHC 31.2 31.3  RDW 17.5* 17.5*  PLT 92* 72*    BNP Recent Labs  Lab 03/02/20 1936  BNP 529.1*     DDimer No results for input(s): DDIMER in the last 168 hours.   Radiology    DG Chest Portable 1 View  Result Date: 03/02/2020 CLINICAL DATA:  Shortness of breath. EXAM: PORTABLE CHEST 1 VIEW COMPARISON:  Radiograph 11/27/2019 FINDINGS: Stable cardiomegaly. Unchanged mediastinal contours. Prior TAVR. Interstitial thickening is suspicious for pulmonary edema. Hazy lung base opacities likely represent small effusions and atelectasis. No pneumothorax or confluent consolidation. Chronic change of both shoulders. IMPRESSION: 1. Cardiomegaly with interstitial thickening suspicious for pulmonary edema. 2. Hazy lung base opacities likely represent small effusions and atelectasis. Electronically Signed   By: Aurther Loft.D.  On: 03/02/2020 20:02    Cardiac Studies  Event monitor 02/02/2020 Abnormal event monitor due to the presence of persistent atrial fibrillation with poor ventricular rate control. The minimum heart rate is 65 bpm, the maximum heart rate is 165 bpm, the average ventricular rate is 96 bpm.  There were no significant pauses or periods of bradycardia.  Very rare PVCs are seen.   TTE (11/22/2019)  1. Left ventricular ejection fraction, by estimation, is 40 to 45%. The  left ventricle has mildly decreased function. The left ventricle  demonstrates global hypokinesis. There is mild concentric left ventricular  hypertrophy. Left ventricular diastolic  function could not be evaluated.  2.  Right ventricular systolic function is mildly reduced. The right  ventricular size is mildly enlarged. There is moderately elevated  pulmonary artery systolic pressure. The estimated right ventricular  systolic pressure is 44.8 mmHg.  3. Left atrial size was severely dilated.  4. Right atrial size was severely dilated.  5. The mitral valve is normal in structure. Mild to moderate mitral valve  regurgitation.  6. The aortic valve has been repaired/replaced. Aortic valve  regurgitation is not visualized. There is a 26 mm Medtronic  CoreValve-EvolutR prosthetic (TAVR) valve present in the aortic position.  Procedure Date: September 2018. Echo findings are  consistent with normal structure and function of the aortic valve  prosthesis.  7. The inferior vena cava is dilated in size with <50% respiratory  variability, suggesting right atrial pressure of 15 mmHg.   Patient Profile     84 y.o. female with a hx of persistent atrial fibrillation, CAD s/p DES @ LAD and RCA (2018), aortic stenosis s/p TAVR (2018), bilateral carotid artery disease s/p right revascularization (2020) admitted with acute on chronic HF and AFib w RVR.  Assessment & Plan    1. Atrial Fibrillation: Improved rate control since admission.  Had early recurrence of arrhythmia following a successful cardioversion.  She has severe biatrial dilation and her odds of long-term maintenance of sinus rhythm remain poor.  Compliant with anticoagulation without bleeding complications.  CHA2DS2-VASc 5 (age 84, gender, CHF, CAD).  QTc long (but would correct to about 480 due to IVCD). Add digoxin, low dose.Transition from IV diltiazem to IV amiodarone. Will try DCCV again after she is amiodarone loaded, but only once she is back to normal volume status. Not a good candidate for dofetilide due to her long baseline QT interval and abnormal renal function.  Not a candidate for flecainide due to underlying CAD. Multaq contraindicated due to  CHF.  2. Acute on Chronic Systolic and Diastolic Heart Failure: By weight she is 26 lbs above "dry weight" of approx 216 lb. In/out not well documented since arrival to hospital, but there is approx 500 ml urine in her canister. Low BP has limited use of HF meds (which is why we switched to mtoprolol from carvedilol). Diltiazem is disadvantageous.  Minimal worsening noted in EF from prior TTE in 01/2019 (EF 45-50% --> EF 40-45%). However, prior TTE did not show right ventricular systolic dysfunction, which now appears to have developed. Likely in the setting of new onset of A. Fib.  3. CAD: S/p DES of the LAD and RCA in 2018. No angina, even with RVR.  4. HLP: On statin, with most recent LDL cholesterol 67.  5. S/P TAVR: normal prosthesis function by recent echo.  No murmur on exam.  6.   Acute on CKD 3b: Creat slightly better overnight, down form 2.75 to 2.49. Baseline  GFR appears to be 35-40.  Creatinine has been 1.36-1.71 over the last few months.  7. Chronic thrombocytopenia: slight further decrease in plt. No bleeding.     For questions or updates, please contact [place] Please consult [email or web address] for contact info under        Signed, Sanda Klein, MD  03/03/2020, 8:53 AM

## 2020-03-03 NOTE — Progress Notes (Signed)
PROGRESS NOTE    Carrie Monroe  OHY:[id number] DOB: [birth year] DOA: 03/02/2020 PCP: Marton Redwood, MD    Brief Narrative:  Patient admitted with the working diagnosis of acute on chronic systolic heart failure exacerbation.   84 yo female with past medical history of atrial fibrillation, systolic heart failure, severe aortic stenosis status post TAVR, and chronic kidney disease stage IIIb who presented with dyspnea and weight gain.  Patient reported progressive, and worsening lower extremity edema, orthopnea, dyspnea on exertion and 11 pound weight gain.  Her symptoms were refractive to increased dose of furosemide as an outpatient.  Due to worsening symptoms she called EMS, she was found in atrial fibrillation with rapid ventricular response, she received 10 mg of IV diltiazem on route before arriving to the ED.  On her initial physical examination she was tachycardic 131 bpm, tachypneic 26 breaths/min and hypoxic, oxygen saturation 80% on 2 L per nasal cannula, blood pressure 116/63, positive JVD, lungs with no wheezing, heart S1-S2 present, tachycardic, irregularly irregular, abdomen soft,  Positive 3+ bilateral lower extremity pitting edema. Sodium 140, potassium 4.2, chloride 102, bicarb 27, glucose 153, BUN 63, creatinine 2.79, magnesium 2.5, BNP 529, white count 8.1, hemoglobin 13.7, hematocrit 43.9, platelets 92.  SARS COVID-19 negative.  Urinalysis negative for infection, specific gravity 1.009. Chest radiograph with bilateral interstitial infiltrates at the lower zones, positive small bilateral pleural effusions. EKG 136 bpm, left axis deviation, left bundle branch block, measured QTC 400 ms, atrial fibrillation rhythm, noisy baseline, poor R wave progression, no significant ST segment changes, V5-V6 T wave inversion.  Patient was placed on diltiazem infusion and aggressive diuresis with furosemide.   This am changed to amiodarone infusion per cariology recommendations.     Assessment & Plan:   Principal Problem:   Acute on chronic systolic CHF (congestive heart failure) (HCC) Active Problems:   CAD- severe 3V CAD    Chronic respiratory failure with hypoxia (HCC)   Atrial fibrillation with rapid ventricular response (HCC)   Secondary hypercoagulable state ([place])   Acute renal failure superimposed on stage 3b chronic kidney disease (HCC)   Thrombocytopenia (HCC)   Pressure injury of skin   1. Acute on chronic systolic heart failure exacerbation/ AS sp TAVR. Urine output this am up to 550 ml, patient continue to have severe dyspnea at rest, positive JVD and significant lower extremity edema.   Increase dose of furosemide to 40 mg TID to target further negative fluid balance. Echocardiogram from 06/21 with LV EF 40 to 45%, mild decreased systolic function on the left and right ventricle. Bilateral severe atrial dilatation.   Continue heart failure management wit digoxin, when more stable will add b blocker and ace inhibitors.   2. Atrial fibrillation with rapid ventricular rate. Continue rate control with amiodarone per cardiology recommendations. Anticoagulation with apixaban. Continue close telemetry monitoring.   Plan for possible cardioversion during this hospitalization.   3. AKI on CKD stage 3b to 4/ hypokalemia. Renal function with serum cr at 2.49 from 2,75 with K at 3,5 and bicarbonate at 24. Patient continue to have severe hypervolemia, will increase furosemide to tid dosing and follow up on renal function in am. Continue with aggressive k correction with kcl.    4. Thrombocytopenia. Chronic thrombocytopenia, back in 07.21 plt count was 97. Today is 72. Continue close monitoring. Risk vs benefit will continue anticoagulation with apixaban.   5. CAD/ dyslpidemia. No chest pain, continue heart failure management, troponin elevation due to heart failure exacerbation, no  acs pattern.   Continue statin therapy with rosuvastatin.   6. Stage 2  sacrum pressure ulcer. Present on admission, continue with local skin care.   Patient continue to be at high risk for worsening heart failure.   Status is: Inpatient  Remains inpatient appropriate because:IV treatments appropriate due to intensity of illness or inability to take PO   Dispo: The patient is from: Home              Anticipated d/c is to: Home              Anticipated d/c date is: 3 days              Patient currently is not medically stable to d/c.    DVT prophylaxis: apixaban   Code Status:    full  Family Communication:  No family at the bedside      Nutrition Status:           Skin Documentation: Pressure Injury 03/03/20 Sacrum Mid Stage 2 -  Partial thickness loss of dermis presenting as a shallow open injury with a red, pink wound bed without slough. (Active)  03/03/20 0226  Location: Sacrum  Location Orientation: Mid  Staging: Stage 2 -  Partial thickness loss of dermis presenting as a shallow open injury with a red, pink wound bed without slough.  Wound Description (Comments):   Present on Admission: Yes     Consultants:   Cardiology    Subjective: Patient continue to have sever dyspnea at rest, positive cramps lower extremities, very weak and deconditioned, no nausea or vomiting.   Objective: Vitals:   03/03/20 0817 03/03/20 0900 03/03/20 1118 03/03/20 1208  BP: 93/62 (!) 109/54 (!) 105/59 97/76  Pulse: 84 91 99 84  Resp: 17 20 20 20   Temp: 97.6 F (36.4 C)   97.6 F (36.4 C)  TempSrc: Oral   Oral  SpO2: 96% 96% 95% 93%  Weight:      Height:        Intake/Output Summary (Last 24 hours) at 03/03/2020 1306 Last data filed at 03/03/2020 1044 Gross per 24 hour  Intake 400 ml  Output 1050 ml  Net -650 ml   Filed Weights   03/03/20 0226  Weight: 109.9 kg    Examination:   General: Not in pain, positive dyspnea at rest.  Neurology: Awake and alert, non focal  E ENT: no pallor, no icterus, oral mucosa moist Cardiovascular:  positive moderate to sever JVD. S1-S2 present, irregularly irregular with no gallops, rubs, or murmurs. +++ pitting bilateral lower extremity edema. Pulmonary: positive breath sounds bilaterally, decreased breath sounds with no wheezing or rhonchi. Positive scattered rales. Gastrointestinal. Abdomen soft and non tender Skin. No rashes Musculoskeletal: no joint deformities     Data Reviewed: I have personally reviewed following labs and imaging studies  CBC: Recent Labs  Lab 03/02/20 1936 03/03/20 0327  WBC 8.1 6.8  HGB 13.7 12.6  HCT 43.9 40.3  MCV 102.6* 102.8*  PLT 92* 72*   Basic Metabolic Panel: Recent Labs  Lab 03/02/20 1936 03/03/20 0327  NA 140 141  K 4.2 3.5  CL 102 104  CO2 27 24  GLUCOSE 153* 266*  BUN 63* 61*  CREATININE 2.75* 2.49*  CALCIUM 9.9 9.4  MG 2.5*  --    GFR: Estimated Creatinine Clearance: 20.7 mL/min (A) (by C-G formula based on SCr of 2.49 mg/dL (H)). Liver Function Tests: No results for input(s): AST, ALT, ALKPHOS, BILITOT, PROT,  ALBUMIN in the last 168 hours. No results for input(s): LIPASE, AMYLASE in the last 168 hours. No results for input(s): AMMONIA in the last 168 hours. Coagulation Profile: No results for input(s): INR, PROTIME in the last 168 hours. Cardiac Enzymes: No results for input(s): CKTOTAL, CKMB, CKMBINDEX, TROPONINI in the last 168 hours. BNP (last 3 results) No results for input(s): PROBNP in the last 8760 hours. HbA1C: No results for input(s): HGBA1C in the last 72 hours. CBG: No results for input(s): GLUCAP in the last 168 hours. Lipid Profile: No results for input(s): CHOL, HDL, LDLCALC, TRIG, CHOLHDL, LDLDIRECT in the last 72 hours. Thyroid Function Tests: No results for input(s): TSH, T4TOTAL, FREET4, T3FREE, THYROIDAB in the last 72 hours. Anemia Panel: No results for input(s): VITAMINB12, FOLATE, FERRITIN, TIBC, IRON, RETICCTPCT in the last 72 hours.    Radiology Studies: I have reviewed all of the  imaging during this hospital visit personally     Scheduled Meds: . apixaban  2.5 mg Oral BID  . digoxin  0.125 mg Oral QODAY  . furosemide  40 mg Intravenous Q12H  . potassium chloride  20 mEq Oral TID  . rosuvastatin  10 mg Oral Daily  . sodium chloride flush  3 mL Intravenous Q12H   Continuous Infusions: . sodium chloride    . amiodarone 60 mg/hr (03/03/20 1041)   Followed by  . amiodarone    . diltiazem (CARDIZEM) infusion Stopped (03/03/20 1034)     LOS: 1 day        Suzan Manon Gerome Apley, MD

## 2020-03-04 DIAGNOSIS — N179 Acute kidney failure, unspecified: Secondary | ICD-10-CM | POA: Diagnosis not present

## 2020-03-04 DIAGNOSIS — J9611 Chronic respiratory failure with hypoxia: Secondary | ICD-10-CM | POA: Diagnosis not present

## 2020-03-04 DIAGNOSIS — I5023 Acute on chronic systolic (congestive) heart failure: Secondary | ICD-10-CM | POA: Diagnosis not present

## 2020-03-04 DIAGNOSIS — I25110 Atherosclerotic heart disease of native coronary artery with unstable angina pectoris: Secondary | ICD-10-CM | POA: Diagnosis not present

## 2020-03-04 DIAGNOSIS — I4891 Unspecified atrial fibrillation: Secondary | ICD-10-CM | POA: Diagnosis not present

## 2020-03-04 LAB — CBC
HCT: 41.9 % (ref 36.0–46.0)
Hemoglobin: 13.2 g/dL (ref 12.0–15.0)
MCH: 32 pg (ref 26.0–34.0)
MCHC: 31.5 g/dL (ref 30.0–36.0)
MCV: 101.5 fL — ABNORMAL HIGH (ref 80.0–100.0)
Platelets: 80 10*3/uL — ABNORMAL LOW (ref 150–400)
RBC: 4.13 MIL/uL (ref 3.87–5.11)
RDW: 17.5 % — ABNORMAL HIGH (ref 11.5–15.5)
WBC: 7.1 10*3/uL (ref 4.0–10.5)
nRBC: 0 % (ref 0.0–0.2)

## 2020-03-04 LAB — BASIC METABOLIC PANEL
Anion gap: 11 (ref 5–15)
BUN: 58 mg/dL — ABNORMAL HIGH (ref 8–23)
CO2: 25 mmol/L (ref 22–32)
Calcium: 9.6 mg/dL (ref 8.9–10.3)
Chloride: 101 mmol/L (ref 98–111)
Creatinine, Ser: 2.38 mg/dL — ABNORMAL HIGH (ref 0.44–1.00)
GFR calc non Af Amer: 18 mL/min — ABNORMAL LOW (ref >60)
Glucose, Bld: 219 mg/dL — ABNORMAL HIGH (ref 70–99)
Potassium: 4.6 mmol/L (ref 3.5–5.1)
Sodium: 137 mmol/L (ref 135–145)

## 2020-03-04 LAB — URINE CULTURE: Culture: >=100000 — AB

## 2020-03-04 LAB — UREA NITROGEN, URINE: Urea Nitrogen, Ur: 418 mg/dL

## 2020-03-04 MED ORDER — METOPROLOL TARTRATE 12.5 MG HALF TABLET
12.5000 mg | ORAL_TABLET | Freq: Two times a day (BID) | ORAL | Status: DC
  Administered 2020-03-04 – 2020-03-14 (×19): 12.5 mg via ORAL
  Filled 2020-03-04 (×21): qty 1

## 2020-03-04 MED ORDER — METOLAZONE 5 MG PO TABS
2.5000 mg | ORAL_TABLET | Freq: Once | ORAL | Status: AC
  Administered 2020-03-04: 2.5 mg via ORAL
  Filled 2020-03-04: qty 1

## 2020-03-04 MED ORDER — FUROSEMIDE 10 MG/ML IJ SOLN
10.0000 mg/h | INTRAVENOUS | Status: DC
  Administered 2020-03-04 – 2020-03-11 (×7): 10 mg/h via INTRAVENOUS
  Filled 2020-03-04 (×12): qty 25

## 2020-03-04 NOTE — Progress Notes (Signed)
  Amiodarone Drug - Drug Interaction Consult Note  Recommendations: see info below.   Amiodarone is metabolized by the cytochrome P450 system and therefore has the potential to cause many drug interactions. Amiodarone has an average plasma half-life of 50 days (range 20 to 100 days).   There is potential for drug interactions to occur several weeks or months after stopping treatment and the onset of drug interactions may be slow after initiating amiodarone.   [x]  Statins: Increased risk of myopathy. Simvastatin- restrict dose to 20mg  daily. Other statins: counsel patients to report any muscle pain or weakness immediately.  []  Anticoagulants: Amiodarone can increase anticoagulant effect. Consider warfarin dose reduction. Patients should be monitored closely and the dose of anticoagulant altered accordingly, remembering that amiodarone levels take several weeks to stabilize.  []  Antiepileptics: Amiodarone can increase plasma concentration of phenytoin, the dose should be reduced. Note that small changes in phenytoin dose can result in large changes in levels. Monitor patient and counsel on signs of toxicity.  [x]  Beta blockers: increased risk of bradycardia, AV block and myocardial depression. Sotalol - avoid concomitant use.   Metoprolol resumed 03/04/20  [x]   Calcium channel blockers (diltiazem and verapamil):     increased risk of bradycardia, AV block and myocardial                depression.           -- on IV Diltiazem.  []   Cyclosporine: Amiodarone increases levels of cyclosporine. Reduced dose of cyclosporine is recommended.  [x]  Digoxin dose should be halved when amiodarone is           started.          --  On digoxin 0.125 mg po every other day (new, started low dose 03/03/20 per cardiologist.  [x]  Diuretics: increased risk of cardiotoxicity if hypokalemia      occurs.  []  Oral hypoglycemic agents (glyburide, glipizide, glimepiride): increased risk of hypoglycemia. Patient's  glucose levels should be monitored closely when initiating amiodarone therapy.   []  Drugs that prolong the QT interval:  Torsades de pointes risk may be increased with concurrent use - avoid if possible.  Monitor QTc, also keep magnesium/potassium WNL if concurrent therapy can't be avoided. Marland Kitchen Antibiotics: e.g. fluoroquinolones, erythromycin. . Antiarrhythmics: e.g. quinidine, procainamide,                  disopyramide, sotalol. . Antipsychotics: e.g. phenothiazines, haloperidol.  . Lithium, tricyclic antidepressants, and methadone.  Thank You,  Nicole Cella, RPh Clinical Pharmacist Please check AMION for all [place] phone numbers After 10:00 PM, call [place] [phone]   03/04/2020 2:40 PM

## 2020-03-04 NOTE — Progress Notes (Signed)
Mobility Specialist - Progress Note   03/04/20 1511  Mobility  Activity Stood at bedside  Level of Assistance Moderate assist, patient does 50-74%  Assistive Device Front wheel walker  Mobility Response Tolerated fair  Mobility performed by Mobility specialist  $Mobility charge 1 Mobility    Pre-mobility: 99 HR, 94/61 BP, 95% SpO2 Post-mobility: 103 HR, 104/76 BP, 95% SpO2  Pt required mod assist to elevate trunk during and to scoot to the edge of bed. Mod assist to stand. She did not feel comfortable ambulating or pivoting, so she was assisted back into bed.   Pricilla Handler Mobility Specialist Mobility Specialist Phone: [phone]

## 2020-03-04 NOTE — Progress Notes (Addendum)
PROGRESS NOTE    Carrie Monroe  QJJ:[id number] DOB: 04/23/1936 DOA: 03/02/2020 PCP: Marton Redwood, MD    Brief Narrative:  Patient admitted with the working diagnosis of acute on chronic systolic heart failure exacerbation, complicated with atrial fibrillation with rapid ventricular response   84 yo female with past medical history of atrial fibrillation, systolic heart failure, severe aortic stenosis status post TAVR, and chronic kidney disease stage IIIb who presented with dyspnea and weight gain.  Patient reported progressive, and worsening lower extremity edema, orthopnea, dyspnea on exertion and 11 pound weight gain.  Her symptoms were refractive to increased dose of furosemide as an outpatient.  Due to worsening symptoms she called EMS, she was found in atrial fibrillation with rapid ventricular response, she received 10 mg of IV diltiazem on route before arriving to the ED.  On her initial physical examination she was tachycardic 131 bpm, tachypneic 26 breaths/min and hypoxic, oxygen saturation 80% on 2 L per nasal cannula, blood pressure 116/63, positive JVD, lungs with no wheezing, heart S1-S2 present, tachycardic, irregularly irregular, abdomen soft,  Positive 3+ bilateral lower extremity pitting edema. Sodium 140, potassium 4.2, chloride 102, bicarb 27, glucose 153, BUN 63, creatinine 2.79, magnesium 2.5, BNP 529, white count 8.1, hemoglobin 13.7, hematocrit 43.9, platelets 92.  SARS COVID-19 negative.  Urinalysis negative for infection, specific gravity 1.009. Chest radiograph with bilateral interstitial infiltrates at the lower zones, positive small bilateral pleural effusions. EKG 136 bpm, left axis deviation, left bundle branch block, measured QTC 400 ms, atrial fibrillation rhythm, noisy baseline, poor R wave progression, no significant ST segment changes, V5-V6 T wave inversion.  Patient was placed on diltiazem infusion and aggressive diuresis with furosemide.   This am  changed to amiodarone infusion per cariology recommendations.   Persistent hypervolemia, change to furosemide drip to target further negative fluid balance.   Assessment & Plan:   Principal Problem:   Acute on chronic systolic CHF (congestive heart failure) (HCC) Active Problems:   CAD- severe 3V CAD    Chronic respiratory failure with hypoxia (HCC)   Atrial fibrillation with rapid ventricular response (HCC)   Secondary hypercoagulable state ([place])   Acute renal failure superimposed on stage 3b chronic kidney disease (HCC)   Thrombocytopenia (HCC)   Pressure injury of skin   1. Acute on chronic systolic heart failure exacerbation/ AS sp TAVR. Echocardiogram from 06/21 with LV EF 40 to 45%, mild decreased systolic function on the left and right ventricle. Bilateral severe atrial dilatation. Urine output over last 24 H 1000 ml, patient with persistent hypervolemia.  balance. Mild improvement in lower extremity edema but continue to have significant dyspnea at rest.   Continue diuresis with furosemide drip to target negative fluid balance, plus one dose of metolazone. Continue with digoxin. Holding after load reduction or RAAS inhibition due to risk of hypotension. Started on low dose metoprolol.  2. Atrial fibrillation with rapid ventricular rate. Improved rate control with amiodarone infusion, continue anticoagulation with apixaban.  3. AKI on CKD stage 3b to 4/ hypokalemia.  Stable renal function with serum cr at 2,38 with K at 4,6 and bicarbonate at 25. Continue aggressive diuresis with furosemide infusion, continue K supplementation, follow up on renal panel in am.   4. Thrombocytopenia.  Risk vs benefit will continue anticoagulation with apixaban. Platelet count this am up to 80  5. CAD/ dyslpidemia.  troponin elevation due to heart failure exacerbation, no acs pattern.   On rosuvastatin.   6. Stage 2 sacrum pressure  ulcer. Local skin care. Plan for PT and OT when  cardiovascular more stable.   7. Reactive hyperglycemia. Fating glucose this am is 219. Hgb A1c on 06/21 was 5,4.   Patient continue to be at high risk for worsening heart failure exacerbation.   Status is: Inpatient  Remains inpatient appropriate because:IV treatments appropriate due to intensity of illness or inability to take PO   Dispo: The patient is from: Home              Anticipated d/c is to: Home              Anticipated d/c date is: 3 days              Patient currently is not medically stable to d/c.   DVT prophylaxis: apixaban   Code Status:    full  Family Communication:  I spoke with patient's son at the bedside, we talked in detail about patient's condition, plan of care and prognosis and all questions were addressed.           Skin Documentation: Pressure Injury 03/03/20 Sacrum Mid Stage 2 -  Partial thickness loss of dermis presenting as a shallow open injury with a red, pink wound bed without slough. (Active)  03/03/20 0226  Location: Sacrum  Location Orientation: Mid  Staging: Stage 2 -  Partial thickness loss of dermis presenting as a shallow open injury with a red, pink wound bed without slough.  Wound Description (Comments):   Present on Admission: Yes     Consultants:   Cardiology     Subjective: Patient continue to be very weak and deconditioned, continue to have dyspnea at rest and worse with minimal efforts, lower extremity edema has improved but not yet back to baseline.   Objective: Vitals:   03/04/20 0000 03/04/20 0210 03/04/20 0447 03/04/20 0808  BP: 123/81 106/68 98/72 114/87  Pulse: (!) 111 (!) 101 (!) 102 (!) 108  Resp: 20 (!) 21 20 20   Temp: 97.6 F (36.4 C) 97.8 F (36.6 C) 97.8 F (36.6 C) 97.6 F (36.4 C)  TempSrc: Oral Oral Oral Oral  SpO2: 94% 95% 95% 95%  Weight:      Height:        Intake/Output Summary (Last 24 hours) at 03/04/2020 1056 Last data filed at 03/04/2020 0528 Gross per 24 hour  Intake 976.51 ml    Output 500 ml  Net 476.51 ml   Filed Weights   03/03/20 0226  Weight: 109.9 kg    Examination:   General: Not in pain. Positive dyspnea at rest.  Neurology: Awake and alert, non focal  E ENT: mild pallor, no icterus, oral mucosa moist Cardiovascular: positive JVD. S1-S2 present, irregularly irregular with no gallops, rubs, or murmurs. +++ pitting bilateral lower extremity edema. Pulmonary: positive breath sounds bilaterally, on anterior auscultation with no wheezing or  rhonchi positive rales at dependent zones. . Gastrointestinal. Abdomen protuberant soft and non tender Skin. No rashes Musculoskeletal: no joint deformities     Data Reviewed: I have personally reviewed following labs and imaging studies  CBC: Recent Labs  Lab 03/02/20 1936 03/03/20 0327 03/04/20 0042  WBC 8.1 6.8 7.1  HGB 13.7 12.6 13.2  HCT 43.9 40.3 41.9  MCV 102.6* 102.8* 101.5*  PLT 92* 72* 80*   Basic Metabolic Panel: Recent Labs  Lab 03/02/20 1936 03/03/20 0327 03/04/20 0042  NA 140 141 137  K 4.2 3.5 4.6  CL 102 104 101  CO2 27 24  25  GLUCOSE 153* 266* 219*  BUN 63* 61* 58*  CREATININE 2.75* 2.49* 2.38*  CALCIUM 9.9 9.4 9.6  MG 2.5*  --   --    GFR: Estimated Creatinine Clearance: 21.7 mL/min (A) (by C-G formula based on SCr of 2.38 mg/dL (H)). Liver Function Tests: No results for input(s): AST, ALT, ALKPHOS, BILITOT, PROT, ALBUMIN in the last 168 hours. No results for input(s): LIPASE, AMYLASE in the last 168 hours. No results for input(s): AMMONIA in the last 168 hours. Coagulation Profile: No results for input(s): INR, PROTIME in the last 168 hours. Cardiac Enzymes: No results for input(s): CKTOTAL, CKMB, CKMBINDEX, TROPONINI in the last 168 hours. BNP (last 3 results) No results for input(s): PROBNP in the last 8760 hours. HbA1C: No results for input(s): HGBA1C in the last 72 hours. CBG: No results for input(s): GLUCAP in the last 168 hours. Lipid Profile: No results  for input(s): CHOL, HDL, LDLCALC, TRIG, CHOLHDL, LDLDIRECT in the last 72 hours. Thyroid Function Tests: No results for input(s): TSH, T4TOTAL, FREET4, T3FREE, THYROIDAB in the last 72 hours. Anemia Panel: No results for input(s): VITAMINB12, FOLATE, FERRITIN, TIBC, IRON, RETICCTPCT in the last 72 hours.    Radiology Studies: I have reviewed all of the imaging during this hospital visit personally     Scheduled Meds: . apixaban  2.5 mg Oral BID  . digoxin  0.125 mg Oral QODAY  . metoprolol tartrate  12.5 mg Oral BID  . potassium chloride  20 mEq Oral TID  . rosuvastatin  10 mg Oral Daily  . sodium chloride flush  3 mL Intravenous Q12H   Continuous Infusions: . sodium chloride    . amiodarone 30 mg/hr (03/04/20 0439)  . furosemide (LASIX) infusion       LOS: 2 days        Madison Albea Gerome Apley, MD

## 2020-03-04 NOTE — Progress Notes (Signed)
Progress Note  Patient Name: Carrie Monroe Date of Encounter: 03/04/2020  [facility] Cardiologist: Sanda Klein, MD   Subjective   Mediocre UO 1 liter/24h. Very slight improvement in BUN/creat. No weight today.  Remains in RVR 110s on IV amio. BP remains relatively low.  Inpatient Medications    Scheduled Meds:  apixaban  2.5 mg Oral BID   digoxin  0.125 mg Oral QODAY   furosemide  40 mg Intravenous Q8H   potassium chloride  20 mEq Oral TID   rosuvastatin  10 mg Oral Daily   sodium chloride flush  3 mL Intravenous Q12H   Continuous Infusions:  sodium chloride     amiodarone 30 mg/hr (03/04/20 0439)   PRN Meds: sodium chloride, acetaminophen, ondansetron (ZOFRAN) IV, sodium chloride flush   Vital Signs    Vitals:   03/04/20 0000 03/04/20 0210 03/04/20 0447 03/04/20 0808  BP: 123/81 106/68 98/72 114/87  Pulse: (!) 111 (!) 101 (!) 102 (!) 108  Resp: 20 (!) 21 20 20   Temp: 97.6 F (36.4 C) 97.8 F (36.6 C) 97.8 F (36.6 C) 97.6 F (36.4 C)  TempSrc: Oral Oral Oral Oral  SpO2: 94% 95% 95% 95%  Weight:      Height:        Intake/Output Summary (Last 24 hours) at 03/04/2020 0934 Last data filed at 03/04/2020 0528 Gross per 24 hour  Intake 976.51 ml  Output 1000 ml  Net -23.49 ml   Last 3 Weights 03/03/2020 02/02/2020 01/13/2020  Weight (lbs) 242 lb 4.6 oz 220 lb 216 lb 9.6 oz  Weight (kg) 109.9 kg 99.791 kg 98.249 kg      Telemetry    AFib w RVR - Personally Reviewed  ECG    No new tracing - Personally Reviewed  Physical Exam  Morbidly obese GEN: No acute distress.   Neck: elevated JVD Cardiac: irregular, 1/6 Ao ej murmur, no diastolic murmurs, rubs, or gallops.  Respiratory: Clear to auscultation bilaterally. GI: Soft, nontender, non-distended  MS: 3+ dependent pitting edema; No deformity. Neuro:  Nonfocal  Psych: Normal affect   Labs    High Sensitivity Troponin:   Recent Labs  Lab 03/02/20 1955 03/03/20 0327    TROPONINIHS 54* 52*      Chemistry Recent Labs  Lab 03/02/20 1936 03/03/20 0327 03/04/20 0042  NA 140 141 137  K 4.2 3.5 4.6  CL 102 104 101  CO2 27 24 25   GLUCOSE 153* 266* 219*  BUN 63* 61* 58*  CREATININE 2.75* 2.49* 2.38*  CALCIUM 9.9 9.4 9.6  GFRNONAA 15* 17* 18*  ANIONGAP 11 13 11      Hematology Recent Labs  Lab 03/02/20 1936 03/03/20 0327 03/04/20 0042  WBC 8.1 6.8 7.1  RBC 4.28 3.92 4.13  HGB 13.7 12.6 13.2  HCT 43.9 40.3 41.9  MCV 102.6* 102.8* 101.5*  MCH 32.0 32.1 32.0  MCHC 31.2 31.3 31.5  RDW 17.5* 17.5* 17.5*  PLT 92* 72* 80*    BNP Recent Labs  Lab 03/02/20 1936  BNP 529.1*     DDimer No results for input(s): DDIMER in the last 168 hours.   Radiology    DG Chest Portable 1 View  Result Date: 03/02/2020 CLINICAL DATA:  Shortness of breath. EXAM: PORTABLE CHEST 1 VIEW COMPARISON:  Radiograph 11/27/2019 FINDINGS: Stable cardiomegaly. Unchanged mediastinal contours. Prior TAVR. Interstitial thickening is suspicious for pulmonary edema. Hazy lung base opacities likely represent small effusions and atelectasis. No pneumothorax or confluent consolidation. Chronic  change of both shoulders. IMPRESSION: 1. Cardiomegaly with interstitial thickening suspicious for pulmonary edema. 2. Hazy lung base opacities likely represent small effusions and atelectasis. Electronically Signed   By: Keith Rake M.D.   On: 03/02/2020 20:02    Cardiac Studies   Event monitor 02/02/2020 Abnormal event monitor due to the presence of persistent atrial fibrillation with poor ventricular rate control. The minimum heart rate is 65 bpm, the maximum heart rate is 165 bpm, the average ventricular rate is 96 bpm. There were no significant pauses or periods of bradycardia. Very rare PVCs are seen.   TTE (11/22/2019)  1. Left ventricular ejection fraction, by estimation, is 40 to 45%. The  left ventricle has mildly decreased function. The left ventricle  demonstrates  global hypokinesis. There is mild concentric left ventricular  hypertrophy. Left ventricular diastolic  function could not be evaluated.  2. Right ventricular systolic function is mildly reduced. The right  ventricular size is mildly enlarged. There is moderately elevated  pulmonary artery systolic pressure. The estimated right ventricular  systolic pressure is 02.5 mmHg.  3. Left atrial size was severely dilated.  4. Right atrial size was severely dilated.  5. The mitral valve is normal in structure. Mild to moderate mitral valve  regurgitation.  6. The aortic valve has been repaired/replaced. Aortic valve  regurgitation is not visualized. There is a 26 mm Medtronic  CoreValve-EvolutR prosthetic (TAVR) valve present in the aortic position.  Procedure Date: September 2018. Echo findings are  consistent with normal structure and function of the aortic valve prosthesis.  7. The inferior vena cava is dilated in size with <50% respiratory  variability, suggesting right atrial pressure of 15 mmHg.   Patient Profile     84 y.o. female with a hx of persistentatrial fibrillation, CAD s/p DES @ LAD and RCA (2018), aortic stenosis s/p TAVR (2018), bilateral carotid artery disease s/p right revascularization (2020) admitted with acute on chronic systolic HF and AFib w RVR.  Assessment & Plan    1. Atrial Fibrillation:rate control remains challenging. Digoxin added, very low dose due to renal dysfunction. BP a little better, may allow low dose beta blocker. Had early recurrence of arrhythmia following a successful cardioversion. She has severe biatrial dilation and her odds of long-term maintenance of sinus rhythm remain poor. QTc long (but would correct to about 480 due to IVCD).  Will try DCCV again after she is amiodarone loaded, but only once she is back to normal volume status. Not a good candidate for dofetilide due to her long baseline QT interval and abnormal renal function. Not a  candidate for flecainide due to underlying CAD.Multaq contraindicated due to CHF. Compliant with anticoagulation without bleeding complications. CHA2DS2-VASc 5(age 51, gender, CHF, CAD).  2. Acute on Chronic Systolic Heart Failure:By weight she is 26 lbs above "dry weight" of approx 216 lb. Not a great response to diuretics. Will change to a furosemide drip and add a one-time dose of metolazone. Minimal worsening noted in EF from prior TTE in 01/2019 (EF 45-50% --> EF 40-45%). 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  3. CAD:S/p DES of the LAD and RCA in 2018. No angina, even with RVR.  4. HLP:On statin, with most recent LDL cholesterol 67.  5. S/P TAVR:normal prosthesis function by recent echo.Barely audible murmur on exam.  6. Acute on CKD 3b:Creat slightly better, down fromm 2.75 to 2.49 to 2.38. Baseline GFR appears to  be 35-40. Creatinine has been 1.36-1.71 over the last few months.  7. Chronic thrombocytopenia: slight further decrease in plt, but stable. No bleeding.     For questions or updates, please contact [facility] Please consult [email or web address] for contact info under        Signed, Sanda Klein, MD  03/04/2020, 9:34 AM

## 2020-03-05 DIAGNOSIS — I5023 Acute on chronic systolic (congestive) heart failure: Secondary | ICD-10-CM | POA: Diagnosis not present

## 2020-03-05 DIAGNOSIS — J9611 Chronic respiratory failure with hypoxia: Secondary | ICD-10-CM | POA: Diagnosis not present

## 2020-03-05 DIAGNOSIS — I480 Paroxysmal atrial fibrillation: Secondary | ICD-10-CM | POA: Diagnosis not present

## 2020-03-05 DIAGNOSIS — I4891 Unspecified atrial fibrillation: Secondary | ICD-10-CM | POA: Diagnosis not present

## 2020-03-05 DIAGNOSIS — I25110 Atherosclerotic heart disease of native coronary artery with unstable angina pectoris: Secondary | ICD-10-CM | POA: Diagnosis not present

## 2020-03-05 DIAGNOSIS — N179 Acute kidney failure, unspecified: Secondary | ICD-10-CM | POA: Diagnosis not present

## 2020-03-05 LAB — BASIC METABOLIC PANEL
Anion gap: 14 (ref 5–15)
Anion gap: 11 (ref 5–15)
BUN: 59 mg/dL — ABNORMAL HIGH (ref 8–23)
BUN: 59 mg/dL — ABNORMAL HIGH (ref 8–23)
CO2: 25 mmol/L (ref 22–32)
CO2: 30 mmol/L (ref 22–32)
Calcium: 9.5 mg/dL (ref 8.9–10.3)
Calcium: 9.3 mg/dL (ref 8.9–10.3)
Chloride: 98 mmol/L (ref 98–111)
Chloride: 96 mmol/L — ABNORMAL LOW (ref 98–111)
Creatinine, Ser: 2.36 mg/dL — ABNORMAL HIGH (ref 0.44–1.00)
Creatinine, Ser: 2.3 mg/dL — ABNORMAL HIGH (ref 0.44–1.00)
GFR, Estimated: 19 mL/min — ABNORMAL LOW (ref >60)
GFR, Estimated: 19 mL/min — ABNORMAL LOW (ref >60)
Glucose, Bld: 148 mg/dL — ABNORMAL HIGH (ref 70–99)
Glucose, Bld: 254 mg/dL — ABNORMAL HIGH (ref 70–99)
Potassium: 3.9 mmol/L (ref 3.5–5.1)
Potassium: 3.6 mmol/L (ref 3.5–5.1)
Sodium: 137 mmol/L (ref 135–145)
Sodium: 137 mmol/L (ref 135–145)

## 2020-03-05 MED ORDER — DOCUSATE SODIUM 50 MG PO CAPS
50.0000 mg | ORAL_CAPSULE | Freq: Every day | ORAL | Status: DC | PRN
  Administered 2020-03-05 – 2020-03-07 (×2): 50 mg via ORAL
  Filled 2020-03-05 (×4): qty 1

## 2020-03-05 NOTE — Progress Notes (Signed)
Patient has been in the chair for breakfast and lunch today. Patient wishing to get back into bed. Patient assisted back to bed with steady. Patient assisted with bath and then new pure wick applied.  Call bell with in reach will monitor patient. Roneka Gilpin, Bettina Gavia RN

## 2020-03-05 NOTE — Progress Notes (Signed)
Progress Note  Patient Name: MARJORIA Monroe Date of Encounter: 03/05/2020  [facility] Cardiologist: Sanda Klein, MD   Subjective   Carrie Monroe is an 84 year old female with a past medical history significant for severe AS (s/p TAVR 01/2017 - 26 mm Medtronic CorValve Evolut Pro), CAD (multi-vessel PCI in 2018),HFmrEF (EF 40-45%, Echo 6/21), persistent atrial fibrillation on Eliquis, breast CA s/p lumpectomy & XRT, T2DM, gout, HTN, and hyperlipidemia who presented 03/01/20 with decompensated HF and A fib RVR.  Through course  Net negative 3.5 L in past 24 hours. Stable BUN/Breatinine.  Weight has improved from 03/03/20.  Hearts have improved with mean heart rate 94.  Blood pressure is low (96/74) but stable.  Patient notes that she has had dizziness without vertigo, syncope or near syncope.  Note improvement in her upper body and lower body swelling.  Inpatient Medications    Scheduled Meds: . apixaban  2.5 mg Oral BID  . digoxin  0.125 mg Oral QODAY  . metoprolol tartrate  12.5 mg Oral BID  . potassium chloride  20 mEq Oral TID  . rosuvastatin  10 mg Oral Daily  . sodium chloride flush  3 mL Intravenous Q12H   Continuous Infusions: . sodium chloride    . amiodarone 30 mg/hr (03/05/20 0900)  . furosemide (LASIX) infusion 10 mg/hr (03/05/20 0900)   PRN Meds: sodium chloride, acetaminophen, ondansetron (ZOFRAN) IV, sodium chloride flush   Vital Signs    Vitals:   03/04/20 1925 03/05/20 0004 03/05/20 0336 03/05/20 0810  BP:  105/74 117/75 96/74  Pulse:  (!) 103 100 90  Resp:  20 17 (!) 21  Temp:  97.6 F (36.4 C) 98 F (36.7 C) 97.7 F (36.5 C)  TempSrc: Oral Oral Oral Oral  SpO2:  96% 95% 94%  Weight:   107.4 kg   Height:        Intake/Output Summary (Last 24 hours) at 03/05/2020 1035 Last data filed at 03/05/2020 0900 Gross per 24 hour  Intake 1210.16 ml  Output 4700 ml  Net -3489.84 ml   Last 3 Weights 03/05/2020 03/03/2020 02/02/2020  Weight (lbs)  236 lb 12.4 oz 242 lb 4.6 oz 220 lb  Weight (kg) 107.4 kg 109.9 kg 99.791 kg      Telemetry    Afib; three episodes  of NSVT; longest 5 beats - Personally Reviewed  Physical Exam   GEN: Morbid obese and in nNo acute distress.   Neck: elevated JVD at 60 degrees Cardiac: irregular,irregular with soft systolic murmu;, no diastolic murmurs, rubs, or gallops.  Respiratory: Clear to auscultation bilaterally with good air movement GI: Soft, nontender, non-distended  MS: 3+ dependent pitting edema; with warm extremities; slightly painful on palpitation left without ulceration Neuro:  Nonfocal  Psych: Normal affect   Labs    High Sensitivity Troponin:   Recent Labs  Lab 03/02/20 1955 03/03/20 0327  TROPONINIHS 54* 52*      Chemistry Recent Labs  Lab 03/03/20 0327 03/04/20 0042 03/05/20 0113  NA 141 137 137  K 3.5 4.6 3.9  CL 104 101 98  CO2 24 25 25   GLUCOSE 266* 219* 148*  BUN 61* 58* 59*  CREATININE 2.49* 2.38* 2.36*  CALCIUM 9.4 9.6 9.5  GFRNONAA 17* 18* 19*  ANIONGAP 13 11 14      Hematology Recent Labs  Lab 03/02/20 1936 03/03/20 0327 03/04/20 0042  WBC 8.1 6.8 7.1  RBC 4.28 3.92 4.13  HGB 13.7 12.6 13.2  HCT 43.9 40.3 41.9  MCV 102.6* 102.8* 101.5*  MCH 32.0 32.1 32.0  MCHC 31.2 31.3 31.5  RDW 17.5* 17.5* 17.5*  PLT 92* 72* 80*   BNP Recent Labs  Lab 03/02/20 1936  BNP 529.1*    Radiology    No results found.  Cardiac Studies   Event monitor 02/02/2020 Abnormal event monitor due to the presence of persistent atrial fibrillation with poor ventricular rate control. The minimum heart rate is 65 bpm, the maximum heart rate is 165 bpm, the average ventricular rate is 96 bpm. There were no significant pauses or periods of bradycardia. Very rare PVCs are seen.  TTE (11/22/2019)  1. Left ventricular ejection fraction, by estimation, is 40 to 45%. The  left ventricle has mildly decreased function. The left ventricle  demonstrates global  hypokinesis. There is mild concentric left ventricular  hypertrophy. Left ventricular diastolic  function could not be evaluated.  2. Right ventricular systolic function is mildly reduced. The right  ventricular size is mildly enlarged. There is moderately elevated  pulmonary artery systolic pressure. The estimated right ventricular  systolic pressure is 26.7 mmHg.  3. Left atrial size was severely dilated.  4. Right atrial size was severely dilated.  5. The mitral valve is normal in structure. Mild to moderate mitral valve  regurgitation.  6. The aortic valve has been repaired/replaced. Aortic valve  regurgitation is not visualized. There is a 26 mm Medtronic  CoreValve-EvolutR prosthetic (TAVR) valve present in the aortic position.  Procedure Date: September 2018. Echo findings are  consistent with normal structure and function of the aortic valve prosthesis.  7. The inferior vena cava is dilated in size with <50% respiratory  variability, suggesting right atrial pressure of 15 mmHg.   Patient Profile     84 y.o. female with a hx of persistentatrial fibrillation, CAD s/p DES @ LAD and RCA (2018), aortic stenosis s/p TAVR (2018), bilateral carotid artery disease s/p right revascularization (2020) admitted with acute on chronic systolic HF and AFib w RVR.  Assessment & Plan    Persistent Atrial Fibrillation: - improved rate control with 0.125 digoxin q48 (renal failure)  - on low dose metoprolol 12 mg BID  - CHA2DS2-VASc 5(age 54, gender, CHF, CAD) Eliquis 2.5 - when euvolemic and peri-discharge, may be reasonable for DCCV  Acute on Chronic Systolic Heart Failure (HFmrEF): - hypervolemic but improving on present therapy, dry weight ~ 216 - continue lasix drip 10; if not achieved - 2 L net negative, can restart metolazone  Minimal worsening noted in EF from prior TTE in 01/2019 (EF 45-50% --> EF 40-45%) - would like not not tolerate ACE/ARNI/ARB/MRA at this; can  re-evaluate peri-discharge and as AKI improves.  Acute on CKD 3b: - Creat stable 2.38- 2.36.  - Creatinine has been 1.36-1.71 in the past - I suspect patient will not resolve to baseline - will impact HF therapy as above  CAD and HLD: - S/p DES of the LAD and RCA in 2018. No angina, through course - on eliquis with no recent PCI, metoprolol tartrate 12.5 mg BID, and crestor 10 mg - On statin, with most recent LDL cholesterol 67.  AS s/p TAVR:normal prosthesis function by recent echo  Chronic thrombocytopenia: stable to slight improvement; continue therapy     For questions or updates, please contact [facility] Please consult [email or web address] for contact info under        Signed, Werner Lean, MD  03/05/2020, 10:35 AM

## 2020-03-05 NOTE — Progress Notes (Signed)
PROGRESS NOTE    Carrie Monroe  [id number]:[id number] DOB: 01/15/1936 DOA: 03/02/2020 PCP: Marton Redwood, MD    Brief Narrative:  Patient admitted with the working diagnosis of acute on chronic systolic heart failure exacerbation, complicated with atrial fibrillation with rapid ventricular response  84 yo female with past medical history ofatrial fibrillation, systolic heart failure, severe aortic stenosis status post TAVR,andchronic kidney disease stage IIIb who presented with dyspnea and weight gain. Patient reported progressive, and worsening lower extremity edema, orthopnea, dyspnea on exertion and 11 pound weight gain. Her symptoms were refractive to increased dose of furosemide as an outpatient. Due to worsening symptoms she called EMS, she was found in atrial fibrillation with rapid ventricular response, she received 10 mg of IV diltiazem on route before arriving to the ED. On her initial physical examination she was tachycardic 131 bpm, tachypneic 26 breaths/min and hypoxic, oxygen saturation 80% on 2 L per nasal cannula, blood pressure 116/63, positive JVD, lungs with no wheezing, heart S1-S2 present, tachycardic, irregularly irregular, abdomen soft, Positive3+ bilateral lower extremity pitting edema. Sodium 140, potassium 4.2, chloride 102, bicarb 27, glucose 153, BUN 63, creatinine 2.79, magnesium 2.5, BNP 529, white count 8.1, hemoglobin 13.7, hematocrit 43.9, platelets 92. SARS COVID-19 negative. Urinalysis negative for infection,specific gravity 1.009. Chest radiograph with bilateral interstitial infiltrates at the lower zones, positive small bilateral pleural effusions. EKG 136 bpm, left axis deviation, left bundle branch block, measured QTC 400 ms, atrial fibrillation rhythm, noisy baseline, poor R wave progression, no significant ST segment changes, V5-V6 T wave inversion.  Patient was placed on diltiazem infusion and aggressive diuresis with furosemide.   This am  changed to amiodarone infusion per cariology recommendations.  Persistent hypervolemia, change to furosemide drip to target further negative fluid balance. (metolazone x1 on 10/08)   Assessment & Plan:   Principal Problem:   Acute on chronic systolic CHF (congestive heart failure) (HCC) Active Problems:   CAD- severe 3V CAD    Chronic respiratory failure with hypoxia (HCC)   Atrial fibrillation with rapid ventricular response (HCC)   Secondary hypercoagulable state ([employer])   Acute renal failure superimposed on stage 3b chronic kidney disease (HCC)   Thrombocytopenia (HCC)   Pressure injury of skin    1. Acute on chronic systolic heart failure exacerbation/ AS sp TAVR.Echocardiogram from 06/21 with LV EF 40 to 45%, mild decreased systolic function on the left and right ventricle. Bilateral severe atrial dilatation. Holding after load reduction or RAAS inhibition due to risk of hypotension.  Urine output over last 24 H 3,900 ml, with improvement of her symptoms. Patient is out of bed to chair this am.  Patient tolerating well furosemide drip, to target further negative fluid balance. Continue with digoxin and metoprolol.   2. Atrial fibrillation with rapid ventricular rate.Continue rate control with amiodarone and antithrombotic therapy with apixaban.  Telemetry personally reviewed: atrial fibrillation rhythm with rate of low 100's  3. AKI on CKD stage 3b to 4/ hypokalemia.  Continue to have good response to loop diuretic therapy, urine output over last 24 H 3,900 ml. Renal function stable with serum cr at 2,36 with k at 3,9 and bicarbonate at 25.  Continue K supplementation with Kcl 20 meq tid.   4. Thrombocytopenia.  Risk vs benefit will continue anticoagulation with apixaban.  5. CAD/ dyslpidemia.  troponin elevation due to heart failure exacerbation, no acs pattern.  Patient with no chest pain.  Continue with rosuvastatin.   6. Stage 2 sacrum pressure ulcer.  Continue with local skin care. Follow with Pt and ot recommendations.  7. Reactive hyperglycemia. . Hgb A1c on 06/21 was 5,4. Fasting glucose this am is 148 mg/dl. Patient tolerating po well.     Patient continue to be at high risk for worsening heart failure   Status is: Inpatient  Remains inpatient appropriate because:IV treatments appropriate due to intensity of illness or inability to take PO   Dispo: The patient is from: Home              Anticipated d/c is to: Home              Anticipated d/c date is: > 3 days              Patient currently is not medically stable to d/c.   DVT prophylaxis: apixaban   Code Status:    full  Family Communication:  No family at the bedside      Nutrition Status:           Skin Documentation: Pressure Injury 03/03/20 Sacrum Mid Stage 2 -  Partial thickness loss of dermis presenting as a shallow open injury with a red, pink wound bed without slough. (Active)  03/03/20 0226  Location: Sacrum  Location Orientation: Mid  Staging: Stage 2 -  Partial thickness loss of dermis presenting as a shallow open injury with a red, pink wound bed without slough.  Wound Description (Comments):   Present on Admission: Yes     Consultants:   Cardiology    Subjective: Patient is out of bed to chair this am, reports improvement in dyspnea and lower extremity edema, but not yet back to baseline. no chest pain, no nausea or vomiting.   Objective: Vitals:   03/04/20 1925 03/05/20 0004 03/05/20 0336 03/05/20 0810  BP:  105/74 117/75 96/74  Pulse:  (!) 103 100 90  Resp:  20 17 (!) 21  Temp:  97.6 F (36.4 C) 98 F (36.7 C) 97.7 F (36.5 C)  TempSrc: Oral Oral Oral Oral  SpO2:  96% 95% 94%  Weight:   107.4 kg   Height:        Intake/Output Summary (Last 24 hours) at 03/05/2020 1041 Last data filed at 03/05/2020 0900 Gross per 24 hour  Intake 1210.16 ml  Output 4700 ml  Net -3489.84 ml   Filed Weights   03/03/20 0226 03/05/20 0336    Weight: 109.9 kg 107.4 kg    Examination:   General: deconditioned  Neurology: Awake and alert, non focal  E ENT: mild pallor, no icterus, oral mucosa moist Cardiovascular: No JVD. S1-S2 present, irregularly irregular with no gallops, rubs, or murmurs. +++ pitting bilateral lower extremity edema. Pulmonary: positive breath sounds bilaterally, with, no wheezing, rhonchi or rales on anterior auscultation. Decreased breath sounds at the dependent zones.  Gastrointestinal. Abdomen protuberant, soft and non tender Skin. No rashes Musculoskeletal: no joint deformities     Data Reviewed: I have personally reviewed following labs and imaging studies  CBC: Recent Labs  Lab 03/02/20 1936 03/03/20 0327 03/04/20 0042  WBC 8.1 6.8 7.1  HGB 13.7 12.6 13.2  HCT 43.9 40.3 41.9  MCV 102.6* 102.8* 101.5*  PLT 92* 72* 80*   Basic Metabolic Panel: Recent Labs  Lab 03/02/20 1936 03/03/20 0327 03/04/20 0042 03/05/20 0113  NA 140 141 137 137  K 4.2 3.5 4.6 3.9  CL 102 104 101 98  CO2 27 24 25 25   GLUCOSE 153* 266* 219* 148*  BUN  63* 61* 58* 59*  CREATININE 2.75* 2.49* 2.38* 2.36*  CALCIUM 9.9 9.4 9.6 9.5  MG 2.5*  --   --   --    GFR: Estimated Creatinine Clearance: 21.6 mL/min (A) (by C-G formula based on SCr of 2.36 mg/dL (H)). Liver Function Tests: No results for input(s): AST, ALT, ALKPHOS, BILITOT, PROT, ALBUMIN in the last 168 hours. No results for input(s): LIPASE, AMYLASE in the last 168 hours. No results for input(s): AMMONIA in the last 168 hours. Coagulation Profile: No results for input(s): INR, PROTIME in the last 168 hours. Cardiac Enzymes: No results for input(s): CKTOTAL, CKMB, CKMBINDEX, TROPONINI in the last 168 hours. BNP (last 3 results) No results for input(s): PROBNP in the last 8760 hours. HbA1C: No results for input(s): HGBA1C in the last 72 hours. CBG: No results for input(s): GLUCAP in the last 168 hours. Lipid Profile: No results for input(s):  CHOL, HDL, LDLCALC, TRIG, CHOLHDL, LDLDIRECT in the last 72 hours. Thyroid Function Tests: No results for input(s): TSH, T4TOTAL, FREET4, T3FREE, THYROIDAB in the last 72 hours. Anemia Panel: No results for input(s): VITAMINB12, FOLATE, FERRITIN, TIBC, IRON, RETICCTPCT in the last 72 hours.    Radiology Studies: I have reviewed all of the imaging during this hospital visit personally     Scheduled Meds: . apixaban  2.5 mg Oral BID  . digoxin  0.125 mg Oral QODAY  . metoprolol tartrate  12.5 mg Oral BID  . potassium chloride  20 mEq Oral TID  . rosuvastatin  10 mg Oral Daily  . sodium chloride flush  3 mL Intravenous Q12H   Continuous Infusions: . sodium chloride    . amiodarone 30 mg/hr (03/05/20 0900)  . furosemide (LASIX) infusion 10 mg/hr (03/05/20 0900)     LOS: 3 days        Luan Maberry Gerome Apley, MD

## 2020-03-05 NOTE — Progress Notes (Signed)
Patient assisted to stand and pivot to chair from bed. Patient moderate assist to sit on edge of bed and to stand. Patient able to pivot to chair and sit. Call bell with in reach. Patient on 1L [place] will monitor patient. Khadir Roam, Bettina Gavia RN

## 2020-03-06 DIAGNOSIS — N179 Acute kidney failure, unspecified: Secondary | ICD-10-CM | POA: Diagnosis not present

## 2020-03-06 DIAGNOSIS — I5023 Acute on chronic systolic (congestive) heart failure: Secondary | ICD-10-CM | POA: Diagnosis not present

## 2020-03-06 DIAGNOSIS — I25110 Atherosclerotic heart disease of native coronary artery with unstable angina pectoris: Secondary | ICD-10-CM | POA: Diagnosis not present

## 2020-03-06 DIAGNOSIS — J9611 Chronic respiratory failure with hypoxia: Secondary | ICD-10-CM | POA: Diagnosis not present

## 2020-03-06 LAB — BASIC METABOLIC PANEL
Anion gap: 10 (ref 5–15)
BUN: 57 mg/dL — ABNORMAL HIGH (ref 8–23)
CO2: 32 mmol/L (ref 22–32)
Calcium: 9.3 mg/dL (ref 8.9–10.3)
Chloride: 92 mmol/L — ABNORMAL LOW (ref 98–111)
Creatinine, Ser: 2.2 mg/dL — ABNORMAL HIGH (ref 0.44–1.00)
GFR, Estimated: 20 mL/min — ABNORMAL LOW (ref >60)
Glucose, Bld: 210 mg/dL — ABNORMAL HIGH (ref 70–99)
Potassium: 3 mmol/L — ABNORMAL LOW (ref 3.5–5.1)
Sodium: 134 mmol/L — ABNORMAL LOW (ref 135–145)

## 2020-03-06 LAB — MAGNESIUM: Magnesium: 2 mg/dL (ref 1.7–2.4)

## 2020-03-06 MED ORDER — AMIODARONE HCL 200 MG PO TABS
400.0000 mg | ORAL_TABLET | Freq: Two times a day (BID) | ORAL | Status: DC
  Administered 2020-03-06 – 2020-03-15 (×19): 400 mg via ORAL
  Filled 2020-03-06 (×19): qty 2

## 2020-03-06 NOTE — Progress Notes (Signed)
Progress Note  Patient Name: Carrie Monroe Date of Encounter: 03/06/2020  [facility] Cardiologist: Sanda Klein, MD   Subjective   Carrie Monroe is an 84 year old female with a past medical history significant for severe AS (s/p TAVR 01/2017 - 26 mm Medtronic CorValve Evolut Pro), CAD (multi-vessel PCI in 2018),HFmrEF (EF 40-45%, Echo 6/21), persistent atrial fibrillation on Eliquis, breast CA s/p lumpectomy & XRT, T2DM, gout, HTN, and hyperlipidemia who presented 03/01/20 with decompensated HF and A fib RVR.  Through course  Net negative 3.6 L in past 24 hours. Stable BUN/Creatinine.  Stable heart rate    Inpatient Medications    Scheduled Meds: . apixaban  2.5 mg Oral BID  . digoxin  0.125 mg Oral QODAY  . metoprolol tartrate  12.5 mg Oral BID  . potassium chloride  20 mEq Oral TID  . rosuvastatin  10 mg Oral Daily  . sodium chloride flush  3 mL Intravenous Q12H   Continuous Infusions: . sodium chloride    . amiodarone 30 mg/hr (03/06/20 1016)  . furosemide (LASIX) infusion 10 mg/hr (03/06/20 1016)   PRN Meds: sodium chloride, acetaminophen, docusate sodium, ondansetron (ZOFRAN) IV, sodium chloride flush   Vital Signs    Vitals:   03/06/20 0659 03/06/20 0804 03/06/20 0845 03/06/20 1200  BP:  104/69  116/68  Pulse: 94 (!) 103 93 91  Resp: 20 19  18   Temp:  97.7 F (36.5 C)  97.8 F (36.6 C)  TempSrc:  Oral  Oral  SpO2: 90% 94%  95%  Weight: 108 kg     Height:        Intake/Output Summary (Last 24 hours) at 03/06/2020 1242 Last data filed at 03/06/2020 1209 Gross per 24 hour  Intake 792.15 ml  Output 4400 ml  Net -3607.85 ml   Last 3 Weights 03/06/2020 03/05/2020 03/03/2020  Weight (lbs) 238 lb 1.6 oz 236 lb 12.4 oz 242 lb 4.6 oz  Weight (kg) 108 kg 107.4 kg 109.9 kg      Telemetry    Afib - Personally Reviewed  Physical Exam   GEN: Morbid obese and in nNo acute distress.   Neck: elevated JVD at 60 degrees Cardiac:  irregular,irregular with soft systolic murmu;, no diastolic murmurs, rubs, or gallops.  Respiratory: Clear to auscultation bilaterally with good air movement GI: Soft, nontender, non-distended  MS: 3+ dependent pitting edema; with warm extremities; slightly painful on palpitation left without ulceration Neuro:  Nonfocal  Psych: Normal affect   Labs    High Sensitivity Troponin:   Recent Labs  Lab 03/02/20 1955 03/03/20 0327  TROPONINIHS 54* 52*      Chemistry Recent Labs  Lab 03/04/20 0042 03/05/20 0113 03/05/20 1457  NA 137 137 137  K 4.6 3.9 3.6  CL 101 98 96*  CO2 25 25 30   GLUCOSE 219* 148* 254*  BUN 58* 59* 59*  CREATININE 2.38* 2.36* 2.30*  CALCIUM 9.6 9.5 9.3  GFRNONAA 18* 19* 19*  ANIONGAP 11 14 11      Hematology Recent Labs  Lab 03/02/20 1936 03/03/20 0327 03/04/20 0042  WBC 8.1 6.8 7.1  RBC 4.28 3.92 4.13  HGB 13.7 12.6 13.2  HCT 43.9 40.3 41.9  MCV 102.6* 102.8* 101.5*  MCH 32.0 32.1 32.0  MCHC 31.2 31.3 31.5  RDW 17.5* 17.5* 17.5*  PLT 92* 72* 80*   BNP Recent Labs  Lab 03/02/20 1936  BNP 529.1*    Radiology    No results  found.  Cardiac Studies   Event monitor 02/02/2020 Abnormal event monitor due to the presence of persistent atrial fibrillation with poor ventricular rate control. The minimum heart rate is 65 bpm, the maximum heart rate is 165 bpm, the average ventricular rate is 96 bpm. There were no significant pauses or periods of bradycardia. Very rare PVCs are seen.  TTE (11/22/2019)  1. Left ventricular ejection fraction, by estimation, is 40 to 45%. The  left ventricle has mildly decreased function. The left ventricle  demonstrates global hypokinesis. There is mild concentric left ventricular  hypertrophy. Left ventricular diastolic  function could not be evaluated.  2. Right ventricular systolic function is mildly reduced. The right  ventricular size is mildly enlarged. There is moderately elevated  pulmonary artery  systolic pressure. The estimated right ventricular  systolic pressure is 78.6 mmHg.  3. Left atrial size was severely dilated.  4. Right atrial size was severely dilated.  5. The mitral valve is normal in structure. Mild to moderate mitral valve  regurgitation.  6. The aortic valve has been repaired/replaced. Aortic valve  regurgitation is not visualized. There is a 26 mm Medtronic  CoreValve-EvolutR prosthetic (TAVR) valve present in the aortic position.  Procedure Date: September 2018. Echo findings are  consistent with normal structure and function of the aortic valve prosthesis.  7. The inferior vena cava is dilated in size with <50% respiratory  variability, suggesting right atrial pressure of 15 mmHg.   Patient Profile     84 y.o. female with a hx of persistentatrial fibrillation, CAD s/p DES @ LAD and RCA (2018), aortic stenosis s/p TAVR (2018), bilateral carotid artery disease s/p right revascularization (2020) admitted with acute on chronic systolic HF and AFib w RVR.  Assessment & Plan    Persistent Atrial Fibrillation: - improved rate control with 0.125 digoxin q48 (renal failure), metoprolol 12.5 mg BID, and IV amiodarone - 03/07/20 would transition to 400 mg BID for one week then 200 mg daily  - CHA2DS2-VASc 5(age 44, gender, CHF, CAD) Eliquis 2.5 - when euvolemic and peri-discharge, may be reasonable for DCCV  Acute on Chronic Systolic Heart Failure (HFmrEF): - continue lasix drip 10; if not achieved - 2 L net negative, presently no plan to start metolazone again Minimal worsening noted in EF from prior TTE in 01/2019 (EF 45-50% --> EF 40-45%) - would like not not tolerate ACE/ARNI/ARB/MRA at this; can re-evaluate peri-discharge and as AKI improves  Acute on CKD 3b: - Creat stable ti improving - 2.36-> 2.30.  - Creatinine has been 1.36-1.71 in the past - I suspect patient will not resolve to baseline - will impact HF therapy as above  CAD and HLD: -  S/p DES of the LAD and RCA in 2018. No angina, through course - on eliquis with no recent PCI, metoprolol tartrate 12.5 mg BID, and crestor 10 mg - On statin, with most recent LDL cholesterol 67.  AS s/p TAVR:normal prosthesis function by recent echo  Chronic thrombocytopenia: stable to slight improvement; continue therapy     For questions or updates, please contact [facility] Please consult [email or web address] for contact info under        Signed, Werner Lean, MD  03/06/2020, 12:42 PM

## 2020-03-06 NOTE — Progress Notes (Signed)
PROGRESS NOTE    Carrie Monroe  IRW:[id number] DOB: [birth year] DOA: 03/02/2020 PCP: Marton Redwood, MD    Brief Narrative:  Patient admitted with the working diagnosis of acute on chronic systolic heart failure exacerbation, complicated with atrial fibrillation with rapid ventricular response  84 yo female with past medical history ofatrial fibrillation, systolic heart failure, severe aortic stenosis status post TAVR,andchronic kidney disease stage IIIb who presented with dyspnea and weight gain. Patient reported progressive, and worsening lower extremity edema, orthopnea, dyspnea on exertion and 11 pound weight gain. Her symptoms were refractive to increased dose of furosemide as an outpatient. Due to worsening symptoms she called EMS, she was found in atrial fibrillation with rapid ventricular response, she received 10 mg of IV diltiazem on route before arriving to the ED. On her initial physical examination she was tachycardic 131 bpm, tachypneic 26 breaths/min and hypoxic, oxygen saturation 80% on 2 L per nasal cannula, blood pressure 116/63, positive JVD, lungs with no wheezing, heart S1-S2 present, tachycardic, irregularly irregular, abdomen soft, Positive3+ bilateral lower extremity pitting edema. Sodium 140, potassium 4.2, chloride 102, bicarb 27, glucose 153, BUN 63, creatinine 2.79, magnesium 2.5, BNP 529, white count 8.1, hemoglobin 13.7, hematocrit 43.9, platelets 92. SARS COVID-19 negative. Urinalysis negative for infection,specific gravity 1.009. Chest radiograph with bilateral interstitial infiltrates at the lower zones, positive small bilateral pleural effusions. EKG 136 bpm, left axis deviation, left bundle branch block, measured QTC 400 ms, atrial fibrillation rhythm, noisy baseline, poor R wave progression, no significant ST segment changes, V5-V6 T wave inversion.  Patient was placed on diltiazem infusion and aggressive diuresis with furosemide.   Diltiazem  was changed to amiodarone per cariology recommendations with good toleration.  She has severe hypervolemia, placed on furosemide drip to target further negative fluid balance.(metolazone x1 on 10/08)    Assessment & Plan:   Principal Problem:   Acute on chronic systolic CHF (congestive heart failure) (HCC) Active Problems:   CAD- severe 3V CAD    Chronic respiratory failure with hypoxia (HCC)   Atrial fibrillation with rapid ventricular response (HCC)   Secondary hypercoagulable state ([place])   Acute renal failure superimposed on stage 3b chronic kidney disease (HCC)   Thrombocytopenia (HCC)   Pressure injury of skin     1. Acute on chronic systolic heart failure exacerbation/ AS sp TAVR.Echocardiogram from 06/21 with LV EF 40 to 45%, mild decreased systolic function on the left and right ventricle.Bilateral severe atrial dilatation. Holding after load reduction or RAAS inhibition due to risk of hypotension.  Urine outputover last 24 H is 4,670ml, continue with improvement of her symptoms but not yet back to baseline, continue with edema and dyspnea. Systolic blood pressure 761 mmHg   Continue with digoxin and metoprolol.   2. Atrial fibrillation with rapid ventricular rate.transition to oral amiodarone today and continue with anticoagulation with apixaban.  Continue close telemetry monitoring.   3. AKI on CKD stage 3b to 4/ hypokalemia. Urine output over last 24 H 4,600 ml. Follow up on renal function today, keep K at 4,0  Continue with K supplementation.   4. Thrombocytopenia. Risk vs benefit will continue anticoagulation with apixaban.  5. CAD/ dyslpidemia.troponin elevation due to heart failure exacerbation, no acs pattern.  No chest pain or angina.   On rosuvastatin.   6. Stage 2 sacrum pressure ulcer.Local skin care.Out of bed to chair tid with meals.  Patient will need SNF for further physical therapy.   7. Reactive hyperglycemia. . Hgb A1c  on  06/21 was 5,4.Fasting glucose this am is 254 mg/dl. Continue close monitoring, if persistent hyperglycemia, will add insulin therapy. .    Patient continue to be at high risk for worsening heart failure.   Status is: Inpatient  Remains inpatient appropriate because:IV treatments appropriate due to intensity of illness or inability to take PO   Dispo: The patient is from: Home              Anticipated d/c is to: SNF              Anticipated d/c date is: 3 days              Patient currently is not medically stable to d/c.   DVT prophylaxis: apixaban   Code Status:   full  Family Communication:  I spoke with patient's son at the bedside, we talked in detail about patient's condition, plan of care and prognosis and all questions were addressed.            Skin Documentation: Pressure Injury 03/03/20 Sacrum Mid Stage 2 -  Partial thickness loss of dermis presenting as a shallow open injury with a red, pink wound bed without slough. (Active)  03/03/20 0226  Location: Sacrum  Location Orientation: Mid  Staging: Stage 2 -  Partial thickness loss of dermis presenting as a shallow open injury with a red, pink wound bed without slough.  Wound Description (Comments):   Present on Admission: Yes     Consultants:   Cardiology      Subjective: Patient with persistent dyspnea and lower extremity edema, improved but not yet back to baseline, no nausea or vomiting, no chest pain, continue to be very weak and deconditioned, positive lower back pain, worse with movement,   Objective: Vitals:   03/06/20 0659 03/06/20 0804 03/06/20 0845 03/06/20 1200  BP:  104/69  116/68  Pulse: 94 (!) 103 93 91  Resp: 20 19  18   Temp:  97.7 F (36.5 C)  97.8 F (36.6 C)  TempSrc:  Oral  Oral  SpO2: 90% 94%  95%  Weight: 108 kg     Height:        Intake/Output Summary (Last 24 hours) at 03/06/2020 1435 Last data filed at 03/06/2020 1209 Gross per 24 hour  Intake 565.67 ml  Output 3500  ml  Net -2934.33 ml   Filed Weights   03/03/20 0226 03/05/20 0336 03/06/20 0659  Weight: 109.9 kg 107.4 kg 108 kg    Examination:   General: Not in pain, dyspnea at rest, deconditioned  Neurology: Awake and alert, non focal  E ENT: no pallor, no icterus, oral mucosa moist Cardiovascular: No JVD. S1-S2 present, rhythmic, no gallops, rubs, or murmurs. No lower extremity edema. Pulmonary: positive reath sounds bilaterally, with no wheezing, rhonchi or rales, decreased breath sounds at the dependent zones. . Gastrointestinal. Abdomen soft and non tender, protuberant Skin. No rashes Musculoskeletal: no joint deformities     Data Reviewed: I have personally reviewed following labs and imaging studies  CBC: Recent Labs  Lab 03/02/20 1936 03/03/20 0327 03/04/20 0042  WBC 8.1 6.8 7.1  HGB 13.7 12.6 13.2  HCT 43.9 40.3 41.9  MCV 102.6* 102.8* 101.5*  PLT 92* 72* 80*   Basic Metabolic Panel: Recent Labs  Lab 03/02/20 1936 03/03/20 0327 03/04/20 0042 03/05/20 0113 03/05/20 1457 03/06/20 0124  NA 140 141 137 137 137  --   K 4.2 3.5 4.6 3.9 3.6  --   CL 102 104  101 98 96*  --   CO2 27 24 25 25 30   --   GLUCOSE 153* 266* 219* 148* 254*  --   BUN 63* 61* 58* 59* 59*  --   CREATININE 2.75* 2.49* 2.38* 2.36* 2.30*  --   CALCIUM 9.9 9.4 9.6 9.5 9.3  --   MG 2.5*  --   --   --   --  2.0   GFR: Estimated Creatinine Clearance: 22.2 mL/min (A) (by C-G formula based on SCr of 2.3 mg/dL (H)). Liver Function Tests: No results for input(s): AST, ALT, ALKPHOS, BILITOT, PROT, ALBUMIN in the last 168 hours. No results for input(s): LIPASE, AMYLASE in the last 168 hours. No results for input(s): AMMONIA in the last 168 hours. Coagulation Profile: No results for input(s): INR, PROTIME in the last 168 hours. Cardiac Enzymes: No results for input(s): CKTOTAL, CKMB, CKMBINDEX, TROPONINI in the last 168 hours. BNP (last 3 results) No results for input(s): PROBNP in the last 8760  hours. HbA1C: No results for input(s): HGBA1C in the last 72 hours. CBG: No results for input(s): GLUCAP in the last 168 hours. Lipid Profile: No results for input(s): CHOL, HDL, LDLCALC, TRIG, CHOLHDL, LDLDIRECT in the last 72 hours. Thyroid Function Tests: No results for input(s): TSH, T4TOTAL, FREET4, T3FREE, THYROIDAB in the last 72 hours. Anemia Panel: No results for input(s): VITAMINB12, FOLATE, FERRITIN, TIBC, IRON, RETICCTPCT in the last 72 hours.    Radiology Studies: I have reviewed all of the imaging during this hospital visit personally     Scheduled Meds: . amiodarone  400 mg Oral BID  . apixaban  2.5 mg Oral BID  . digoxin  0.125 mg Oral QODAY  . metoprolol tartrate  12.5 mg Oral BID  . potassium chloride  20 mEq Oral TID  . rosuvastatin  10 mg Oral Daily  . sodium chloride flush  3 mL Intravenous Q12H   Continuous Infusions: . sodium chloride    . furosemide (LASIX) infusion 10 mg/hr (03/06/20 1312)     LOS: 4 days        Junie Avilla Gerome Apley, MD

## 2020-03-06 NOTE — Progress Notes (Signed)
Occupational Therapy Evaluation Patient Details Name: Carrie Monroe MRN: [id number] DOB: 10/17/1935 Today's Date: 03/06/2020    History of Present Illness 84 y.o. female presenting with weight gain, worsening of chronic BLE edema, and progressive dyspnea. Patient admitted with acute on chronic systolic CHF. Previous admission 11/2019 with new onset afib w/ RVR and acute on chronic CHF. PMH includes: 3-vessel CAD s/p DES PCI (2018), severe AS s/p TAVR (2018), chronic HFpEF, HTN, HLD, DMII, and CKD stage IIIb.    Clinical Impression   PTA, patient was living alone in a private residence and was grossly Mod I with BADLs/IADLs including light meal prep without device, however patient reports "furniture surfing" in home environment. Patient was sleeping in a recliner. Patient notes occasional PRN assistance from family and friends and paid help for heavy cleaning. Patient was not driving and was using a delivery services for groceries. Patient currently presents below baseline level of function requiring Min guard to Min A for functional transfers without device. Patient also requiring Mod A for LB dressing without AE. Patient also limited by need for supplemental OT and A-fib with RVR. HR between 90-100bpm at rest and with activity this date. Patient would benefit from continued acute OT services in prep for safe return to PLOF with recommendation for HHOT vs. SNF pending patient progress. Patient notes that her daughter is visiting from out of town next week and can provide 24hr supervision/assist for a short time.    Follow Up Recommendations  Home health OT;SNF;Supervision/Assistance - 24 hour    Equipment Recommendations  None recommended by OT    Recommendations for Other Services       Precautions / Restrictions Precautions Precautions: Fall Restrictions Weight Bearing Restrictions: No      Mobility Bed Mobility Overal bed mobility: Needs Assistance Bed Mobility: Supine to  Sit     Supine to sit: Mod assist     General bed mobility comments: Mod A for supine to EOB with HOB elevated and use of bed rails.   Transfers Overall transfer level: Needs assistance Equipment used: None;1 person hand held assist Transfers: Sit to/from [facility] Sit to Stand: Min guard Stand pivot transfers: Min assist       General transfer comment: Min guard for sit to stand from EOB with cues for hand placement. Min A and hand held asssit for SPT to recliner. No LOB but patient reports feeling unsteady on her feet.     Balance Overall balance assessment: Needs assistance Sitting-balance support: No upper extremity supported;Feet supported Sitting balance-Leahy Scale: Good     Standing balance support: Single extremity supported Standing balance-Leahy Scale: Poor Standing balance comment: Reliant on furniture surfing and external assist from therapist                           ADL either performed or assessed with clinical judgement   ADL Overall ADL's : Needs assistance/impaired Eating/Feeding: Set up   Grooming: Set up;Sitting           Upper Body Dressing : Sitting;Set up   Lower Body Dressing: Moderate assistance;Sit to/from stand;Sitting/lateral leans Lower Body Dressing Details (indicate cue type and reason): Max A to don bilateral footwear seated EOB without AE.  Toilet Transfer: Minimal Print production planner Details (indicate cue type and reason): Simulated with stand-pivot transfer to recliner without use of DME and hand held assist.          Functional mobility during  ADLs: Minimal assistance General ADL Comments: Patient declines use of DME at home. Min A for short-distance ambulation in room without use of DME and hand held assist.      Vision Baseline Vision/History: Wears glasses Wears Glasses: Reading only Patient Visual Report: No change from baseline Vision Assessment?: No apparent visual deficits      Perception Perception Perception Tested?: No   Praxis Praxis Praxis tested?: Not tested    Pertinent Vitals/Pain Pain Assessment: No/denies pain     Hand Dominance Right   Extremity/Trunk Assessment Upper Extremity Assessment Upper Extremity Assessment: RUE deficits/detail;LUE deficits/detail RUE Deficits / Details: Previous injury to RUE limiting AROM at shoulder RUE Sensation: WNL RUE Coordination: WNL LUE Deficits / Details: Previous injury limiting AROM  LUE Sensation: WNL LUE Coordination: WNL   Lower Extremity Assessment Lower Extremity Assessment: Defer to PT evaluation   Cervical / Trunk Assessment Cervical / Trunk Assessment: Normal   Communication Communication Communication: No difficulties   Cognition Arousal/Alertness: Lethargic Behavior During Therapy: WFL for tasks assessed/performed Overall Cognitive Status: Within Functional Limits for tasks assessed                                     General Comments  HR 90-100's at rest and with functional transfers. SpO2 90% on RA and 92% on 2L via [place].     Exercises     Shoulder Instructions      Home Living Family/patient expects to be discharged to:: Private residence Living Arrangements: Alone Available Help at Discharge: Family;Available PRN/intermittently;Friend(s) (Calls in with family and friends everyday) Type of Home: House Home Access: Ramped entrance     Home Layout: One level     Bathroom Shower/Tub: Occupational psychologist: Handicapped height     Home Equipment: Bedside commode;Shower seat;Grab bars - tub/shower;Hand held shower head;Transport chair          Prior Functioning/Environment Level of Independence: Independent with assistive device(s)        Comments: Patient reports no use of AD in home or community dwellings but "furniture surfs". Patient was Mod I with bathing/dressing in sitting with use of shower chair in walk-in shower. Patient  independent with small meal prep but requires assist for heavy cleaning. Patient was sleeping in a recliner.         OT Problem List: Decreased strength;Decreased range of motion;Decreased activity tolerance;Impaired balance (sitting and/or standing);Decreased knowledge of use of DME or AE;Cardiopulmonary status limiting activity;Impaired UE functional use;Increased edema      OT Treatment/Interventions: Self-care/ADL training;Therapeutic exercise;Energy conservation;DME and/or AE instruction;Therapeutic activities;Patient/family education;Balance training    OT Goals(Current goals can be found in the care plan section) Acute Rehab OT Goals Patient Stated Goal: To return home. Patient also in agreement with SNF placement pending progress.  OT Goal Formulation: With patient Time For Goal Achievement: 03/20/20 Potential to Achieve Goals: Good ADL Goals Pt Will Perform Grooming: with modified independence;standing Pt Will Perform Upper Body Dressing: with modified independence;sitting Pt Will Perform Lower Body Dressing: with modified independence;sit to/from stand;sitting/lateral leans;with adaptive equipment Pt Will Transfer to Toilet: with modified independence;ambulating Pt Will Perform Toileting - Clothing Manipulation and hygiene: with modified independence;sitting/lateral leans;sit to/from stand Pt Will Perform Tub/Shower Transfer: Shower transfer;shower seat;rolling walker Pt/caregiver will Perform Home Exercise Program: Increased ROM;Increased strength;Both right and left upper extremity;With written HEP provided  OT Frequency: Min 3X/week   Barriers to D/C: Decreased  caregiver support          Co-evaluation              AM-PAC OT "6 Clicks" Daily Activity     Outcome Measure Help from another person eating meals?: None Help from another person taking care of personal grooming?: A Little Help from another person toileting, which includes using toliet, bedpan, or  urinal?: A Little Help from another person bathing (including washing, rinsing, drying)?: A Lot Help from another person to put on and taking off regular upper body clothing?: A Little Help from another person to put on and taking off regular lower body clothing?: A Lot 6 Click Score: 17   End of Session Equipment Utilized During Treatment: Gait belt;Rolling walker Nurse Communication: Mobility status  Activity Tolerance: Patient tolerated treatment well Patient left: in chair;with call bell/phone within reach  OT Visit Diagnosis: Unsteadiness on feet (R26.81);Muscle weakness (generalized) (M62.81)                Time: 9323-5573 OT Time Calculation (min): 30 min Charges:  OT General Charges $OT Visit: 1 Visit OT Evaluation $OT Eval Moderate Complexity: 1 Mod OT Treatments $Self Care/Home Management : 23-37 mins  Zimere Dunlevy H. OTR/L Supplemental OT, Department of rehab services [phone]  Ariadna Setter R H. 03/06/2020, 9:51 AM

## 2020-03-06 NOTE — Evaluation (Signed)
Physical Therapy Evaluation Patient Details Name: Carrie Monroe MRN: [id number] DOB: 1935-07-03 Today's Date: 03/06/2020   History of Present Illness  84 y.o. female presenting with weight gain, worsening of chronic BLE edema, and progressive dyspnea. Patient admitted with acute on chronic systolic CHF. Previous admission 11/2019 with new onset afib w/ RVR and acute on chronic CHF. PMH includes: 3-vessel CAD s/p DES PCI (2018), severe AS s/p TAVR (2018), chronic HFpEF, HTN, HLD, DMII, and CKD stage IIIb.   Clinical Impression  Pt presents to PT with deficits in functional mobility, gait, balance, endurance, strength, power. Pt is generally weak, requiring physical assistance to mobilize in bed and transfer at this time. Pt also fatigues with short distances of ambulation. Pt will benefit from continued acute PT POC to reduce falls risk and improve activity tolerance. PT recommends SNF placement at this time as the pt has limited caregiver support until her daughter arrives from [place] on Sunday October 17th.    Follow Up Recommendations SNF;Supervision/Assistance - 24 hour    Equipment Recommendations  None recommended by PT    Recommendations for Other Services       Precautions / Restrictions Precautions Precautions: Fall Restrictions Weight Bearing Restrictions: No      Mobility  Bed Mobility Overal bed mobility: Needs Assistance Bed Mobility: Sit to Supine       Sit to supine: Mod assist   General bed mobility comments: modA for LE management  Transfers Overall transfer level: Needs assistance Equipment used: Rolling walker (2 wheeled) Transfers: Sit to/from [facility] Sit to Stand: Min assist Stand pivot transfers: Min assist          Ambulation/Gait Ambulation/Gait assistance: Min guard Gait Distance (Feet): 12 Feet Assistive device: Rolling walker (2 wheeled) Gait Pattern/deviations: Step-to pattern Gait velocity: reduced Gait  velocity interpretation: <1.31 ft/sec, indicative of household ambulator General Gait Details: pt with short step to gait, reduced foot clearance  Stairs            Wheelchair Mobility    Modified Rankin (Stroke Patients Only)       Balance Overall balance assessment: Needs assistance Sitting-balance support: No upper extremity supported;Feet supported Sitting balance-Leahy Scale: Fair     Standing balance support: Bilateral upper extremity supported Standing balance-Leahy Scale: Poor Standing balance comment: reliant on UE support of RW                             Pertinent Vitals/Pain Pain Assessment: Faces Faces Pain Scale: Hurts even more Pain Location: buttocks Pain Descriptors / Indicators: Sore Pain Intervention(s): Monitored during session    Home Living Family/patient expects to be discharged to:: Private residence Living Arrangements: Alone Available Help at Discharge: Family;Available PRN/intermittently;Friend(s) (via phone most often, dtr flying in next sunday 10/17) Type of Home: House Home Access: Ramped entrance     Home Layout: One level Home Equipment: Bedside commode;Shower seat;Grab bars - tub/shower;Hand held shower head;Transport chair      Prior Function Level of Independence: Independent with assistive device(s)         Comments: pt reports using cane for community distances, although she has not moved much over the past few weeks 2/2 edema     Hand Dominance   Dominant Hand: Right    Extremity/Trunk Assessment   Upper Extremity Assessment Upper Extremity Assessment: Defer to OT evaluation    Lower Extremity Assessment Lower Extremity Assessment: Generalized weakness    Cervical /  Trunk Assessment Cervical / Trunk Assessment: Normal  Communication   Communication: No difficulties  Cognition Arousal/Alertness: Awake/alert Behavior During Therapy: WFL for tasks assessed/performed Overall Cognitive Status:  Within Functional Limits for tasks assessed                                        General Comments General comments (skin integrity, edema, etc.): VSS on 2L  during session    Exercises     Assessment/Plan    PT Assessment Patient needs continued PT services  PT Problem List Decreased strength;Decreased activity tolerance;Decreased mobility;Decreased balance;Decreased knowledge of use of DME;Decreased safety awareness;Decreased knowledge of precautions;Cardiopulmonary status limiting activity;Pain       PT Treatment Interventions DME instruction;Gait training;Functional mobility training;Therapeutic activities;Balance training;Neuromuscular re-education;Therapeutic exercise;Patient/family education    PT Goals (Current goals can be found in the Care Plan section)  Acute Rehab PT Goals Patient Stated Goal: To improve activity tolerance and return to independence PT Goal Formulation: With patient Time For Goal Achievement: 03/20/20 Potential to Achieve Goals: Good    Frequency Min 3X/week (may progress to home)   Barriers to discharge        Co-evaluation               AM-PAC PT "6 Clicks" Mobility  Outcome Measure Help needed turning from your back to your side while in a flat bed without using bedrails?: A Little Help needed moving from lying on your back to sitting on the side of a flat bed without using bedrails?: A Lot Help needed moving to and from a bed to a chair (including a wheelchair)?: A Little Help needed standing up from a chair using your arms (e.g., wheelchair or bedside chair)?: A Little Help needed to walk in hospital room?: A Little Help needed climbing 3-5 steps with a railing? : A Lot 6 Click Score: 16    End of Session Equipment Utilized During Treatment: Oxygen Activity Tolerance: Patient tolerated treatment well Patient left: in bed;with call bell/phone within reach;with bed alarm set Nurse Communication: Mobility  status PT Visit Diagnosis: Unsteadiness on feet (R26.81);Muscle weakness (generalized) (M62.81)    Time: 9407-6808 PT Time Calculation (min) (ACUTE ONLY): 24 min   Charges:   PT Evaluation $PT Eval Moderate Complexity: 1 Mod PT Treatments $Therapeutic Activity: 8-22 mins        Zenaida Niece, PT, DPT Acute Rehabilitation Pager: [phone]   Zenaida Niece 03/06/2020, 1:04 PM

## 2020-03-07 DIAGNOSIS — N1832 Chronic kidney disease, stage 3b: Secondary | ICD-10-CM | POA: Diagnosis not present

## 2020-03-07 DIAGNOSIS — I5023 Acute on chronic systolic (congestive) heart failure: Secondary | ICD-10-CM | POA: Diagnosis not present

## 2020-03-07 DIAGNOSIS — I25110 Atherosclerotic heart disease of native coronary artery with unstable angina pectoris: Secondary | ICD-10-CM | POA: Diagnosis not present

## 2020-03-07 DIAGNOSIS — J9611 Chronic respiratory failure with hypoxia: Secondary | ICD-10-CM | POA: Diagnosis not present

## 2020-03-07 DIAGNOSIS — N179 Acute kidney failure, unspecified: Secondary | ICD-10-CM | POA: Diagnosis not present

## 2020-03-07 LAB — BASIC METABOLIC PANEL
Anion gap: 11 (ref 5–15)
BUN: 61 mg/dL — ABNORMAL HIGH (ref 8–23)
CO2: 34 mmol/L — ABNORMAL HIGH (ref 22–32)
Calcium: 9.3 mg/dL (ref 8.9–10.3)
Chloride: 91 mmol/L — ABNORMAL LOW (ref 98–111)
Creatinine, Ser: 2.42 mg/dL — ABNORMAL HIGH (ref 0.44–1.00)
GFR, Estimated: 18 mL/min — ABNORMAL LOW (ref >60)
Glucose, Bld: 195 mg/dL — ABNORMAL HIGH (ref 70–99)
Potassium: 3.3 mmol/L — ABNORMAL LOW (ref 3.5–5.1)
Sodium: 136 mmol/L (ref 135–145)

## 2020-03-07 LAB — CBC
HCT: 38.8 % (ref 36.0–46.0)
Hemoglobin: 12.3 g/dL (ref 12.0–15.0)
MCH: 31.5 pg (ref 26.0–34.0)
MCHC: 31.7 g/dL (ref 30.0–36.0)
MCV: 99.2 fL (ref 80.0–100.0)
Platelets: 69 10*3/uL — ABNORMAL LOW (ref 150–400)
RBC: 3.91 MIL/uL (ref 3.87–5.11)
RDW: 17.4 % — ABNORMAL HIGH (ref 11.5–15.5)
WBC: 5.7 10*3/uL (ref 4.0–10.5)
nRBC: 0 % (ref 0.0–0.2)

## 2020-03-07 LAB — MAGNESIUM: Magnesium: 2 mg/dL (ref 1.7–2.4)

## 2020-03-07 MED ORDER — METOLAZONE 5 MG PO TABS
2.5000 mg | ORAL_TABLET | Freq: Once | ORAL | Status: AC
  Administered 2020-03-07: 2.5 mg via ORAL
  Filled 2020-03-07: qty 1

## 2020-03-07 NOTE — Progress Notes (Signed)
Progress Note  Patient Name: Carrie Monroe Date of Encounter: 03/07/2020  Primary Cardiologist: Sanda Klein, MD   Subjective   Carrie Monroe is an 84 year old female with a past medical history significant for severe AS (s/p TAVR 01/2017 - 26 mm Medtronic CorValve Evolut Pro), CAD (multi-vessel PCI in 2018),HFmrEF (EF 40-45%, Echo 6/21), persistent atrial fibrillation on Eliquis, breast CA s/p lumpectomy & XRT, T2DM, gout, HTN, and hyperlipidemia who presented 03/01/20 with decompensated HF and A fib RVR.  Through course has diuresed well with drip.  Patient notes that she feels terrible, but that she always feels terrible.  No CP, SOB.  Notes improvement in LE edema.  Patient notes that she is eager to leave but wants to leave with her fluid gone in her legs.  Inpatient Medications    Scheduled Meds:  amiodarone  400 mg Oral BID   apixaban  2.5 mg Oral BID   digoxin  0.125 mg Oral QODAY   metoprolol tartrate  12.5 mg Oral BID   potassium chloride  20 mEq Oral TID   rosuvastatin  10 mg Oral Daily   sodium chloride flush  3 mL Intravenous Q12H   Continuous Infusions:  sodium chloride     furosemide (LASIX) infusion 10 mg/hr (03/06/20 1600)   PRN Meds: sodium chloride, acetaminophen, docusate sodium, ondansetron (ZOFRAN) IV, sodium chloride flush   Vital Signs    Vitals:   03/07/20 0500 03/07/20 0520 03/07/20 0747 03/07/20 0748  BP:  (!) 129/50 96/76   Pulse:  92 99 100  Resp:  20 (!) 21 19  Temp:  98.1 F (36.7 C) 98 F (36.7 C)   TempSrc:  Oral Axillary   SpO2:  96% 95% 96%  Weight: 102.6 kg     Height:        Intake/Output Summary (Last 24 hours) at 03/07/2020 1011 Last data filed at 03/07/2020 0719 Gross per 24 hour  Intake 418.58 ml  Output 2950 ml  Net -2531.42 ml   Filed Weights   03/05/20 0336 03/06/20 0659 03/07/20 0500  Weight: 107.4 kg 108 kg 102.6 kg    Telemetry    Atrial fibrillation - Personally Reviewed  ECG    No new-  Personally Reviewed  Physical Exam   GEN: No acute distress.   Neck: JVD to mid neck at 45 degrees Cardiac: Irregularly irregular, no murmurs, rubs, or gallops.  Respiratory: Clear to auscultation bilaterally. GI: Soft, nontender, non-distended  MS: +2 edema; No deformity. Neuro:  Nonfocal  Psych: Normal affect   Labs    Chemistry Recent Labs  Lab 03/05/20 0113 03/05/20 1457 03/06/20 1451  NA 137 137 134*  K 3.9 3.6 3.0*  CL 98 96* 92*  CO2 25 30 32  GLUCOSE 148* 254* 210*  BUN 59* 59* 57*  CREATININE 2.36* 2.30* 2.20*  CALCIUM 9.5 9.3 9.3  GFRNONAA 19* 19* 20*  ANIONGAP 14 11 10      Hematology Recent Labs  Lab 03/03/20 0327 03/04/20 0042 03/07/20 0228  WBC 6.8 7.1 5.7  RBC 3.92 4.13 3.91  HGB 12.6 13.2 12.3  HCT 40.3 41.9 38.8  MCV 102.8* 101.5* 99.2  MCH 32.1 32.0 31.5  MCHC 31.3 31.5 31.7  RDW 17.5* 17.5* 17.4*  PLT 72* 80* 69*   BNP Recent Labs  Lab 03/02/20 1936  BNP 529.1*    Radiology    No results found.  Cardiac Studies   No new studies  Patient Profile  84 y.o. female AS, CAD, HFmrEF, and A fib with decompensated HF.  Assessment & Plan    Persistent Atrial Fibrillation:  - improved rate control with 0.125 digoxin q48 (renal failure), metoprolol 12.5 mg BID - 1Amiodarone 400 mg BID for one week then 200 mg daily (02/1820) - CHA2DS2-VASc 5 (age 89, gender, CHF, CAD) Eliquis 2.5 - when euvolemic and peri-discharge, may be reasonable for DCCV   Acute on Chronic Systolic Heart Failure (HFmrEF):  - continue lasix drip 10 - discussed with patient, whose goal is to get back home as quickly and safely as possible.  Given profound volume overload, will re-trial metolazone 2.5 mg X1  Minimal worsening noted in EF from prior TTE in 01/2019 (EF 45-50% --> EF 40-45%) - would like not not tolerate ACE/ARNI/ARB/MRA at this; can re-evaluate peri-discharge and as AKI improves - likely will need DC on torsemide vs Bumex   Acute on CKD 3b:    - Creat stable ti improving - 2.30-> 2.20 - Creatinine has been 1.36-1.71 in the past - will impact HF therapy as above   CAD and HLD:  - S/p DES of the LAD and RCA in 2018. No angina, through course - on eliquis with no recent PCI, metoprolol tartrate 12.5 mg BID, and crestor 10 mg - On statin, with most recent LDL cholesterol 67.    AS s/p TAVR: normal prosthesis function by recent echo  For questions or updates, please contact [facility] Please consult [email or web address] for contact info under Cardiology/STEMI.      Signed, Werner Lean, MD  03/07/2020, 10:11 AM

## 2020-03-07 NOTE — Progress Notes (Signed)
CSW went in to complete SNF workup. Pt advised CSW that she would like to go home, her daughter is coming from [place] on Sunday and will be staying here for a week. Pt stated she would like to have [facility] services and home PT. CSW advised she would let RNCM know.

## 2020-03-07 NOTE — Care Management Important Message (Signed)
Important Message  Patient Details  Name: Carrie Monroe MRN: [id number] Date of Birth: Jan 19, 1936   Medicare Important Message Given:  Yes     Shelda Altes 03/07/2020, 12:30 PM

## 2020-03-07 NOTE — Progress Notes (Signed)
Mobility Specialist - Progress Note   03/07/20 1437  Mobility  Activity Ambulated in hall  Level of Assistance Moderate assist, patient does 50-74%  Assistive Device Front wheel walker  Distance Ambulated (ft) 10 ft  Mobility Response Tolerated fair  Mobility performed by Mobility specialist  $Mobility charge 1 Mobility    Pre-mobility: 86 HR, 97% SpO2 During mobility: 100 HR, 91% SpO2 Post-mobility: 93 HR, 82/55 BP, 95% SpO2  Pt c/o dizziness while ambulating. Pt up in chair after ambulation. RN notified of BP.   Pricilla Handler Mobility Specialist Mobility Specialist Phone: [phone]

## 2020-03-07 NOTE — Progress Notes (Signed)
PROGRESS NOTE    Carrie Monroe  UYQ:[id number] DOB: 05/10/1936 DOA: 03/02/2020 PCP: Marton Redwood, MD    Brief Narrative:  Patient admitted with the working diagnosis of acute on chronic systolic heart failure exacerbation, complicated with atrial fibrillation with rapid ventricular response  84 yo female with past medical history ofatrial fibrillation, systolic heart failure, severe aortic stenosis status post TAVR,andchronic kidney disease stage IIIb who presented with dyspnea and weight gain. Patient reported progressive, and worsening lower extremity edema, orthopnea, dyspnea on exertion and 11 pound weight gain. Her symptoms were refractive to increased dose of furosemide as an outpatient. Due to worsening symptoms she called EMS, she was found in atrial fibrillation with rapid ventricular response, she received 10 mg of IV diltiazem on route before arriving to the ED. On her initial physical examination she was tachycardic 131 bpm, tachypneic 26 breaths/min and hypoxic, oxygen saturation 80% on 2 L per nasal cannula, blood pressure 116/63, positive JVD, lungs with no wheezing, heart S1-S2 present, tachycardic, irregularly irregular, abdomen soft, Positive3+ bilateral lower extremity pitting edema. Sodium 140, potassium 4.2, chloride 102, bicarb 27, glucose 153, BUN 63, creatinine 2.79, magnesium 2.5, BNP 529, white count 8.1, hemoglobin 13.7, hematocrit 43.9, platelets 92. SARS COVID-19 negative. Urinalysis negative for infection,specific gravity 1.009. Chest radiograph with bilateral interstitial infiltrates at the lower zones, positive small bilateral pleural effusions. EKG 136 bpm, left axis deviation, left bundle branch block, measured QTC 400 ms, atrial fibrillation rhythm, noisy baseline, poor R wave progression, no significant ST segment changes, V5-V6 T wave inversion.  Patient was placed on diltiazem infusion and aggressive diuresis with furosemide.   Diltiazem  was changed to amiodarone per cariology recommendations with good toleration.  She has severe hypervolemia, placed on furosemide drip to target further negative fluid balance.(metolazone x2 on 10/08-10/11)    Assessment & Plan:   Principal Problem:   Acute on chronic systolic CHF (congestive heart failure) (HCC) Active Problems:   CAD- severe 3V CAD    Chronic respiratory failure with hypoxia (HCC)   Atrial fibrillation with rapid ventricular response (HCC)   Secondary hypercoagulable state ([place])   Acute renal failure superimposed on stage 3b chronic kidney disease (HCC)   Thrombocytopenia (HCC)   Pressure injury of skin   1. Acute on chronic systolic heart failure exacerbation/ AS sp TAVR.Echocardiogram from 06/21 with LV EF 40 to 45%, mild decreased systolic function on the left and right ventricle.Bilateral severe atrial dilatation. Holding after load reduction or RAAS inhibition due to risk of hypotension.  Urine outputover last 24 His 2,759ml,serum cr up to 2,42 from 2,2 and systolic blood pressure 96 to 123 mmHg. Edema has improved but not yet back to baseline.   Continue medical therapy with digoxin and metoprolol. Furosemide drip and one dose of metolazone today 2,5 mg. Continue to follow up on urine output and hemodynamics.    2. Atrial fibrillation with rapid ventricular rate. Continue with amiodarone per protocol, anticoagulation with apixaban.  Out of bed to chair, PT and OT. Patient plans to go home with home health services.   3. AKI on CKD stage 3b to 4/ hypokalemia. Stable renal function with serum cr at 2,42 with K at 3.3 and Mg at 2,0.  Continue with aggressive diuresis, follow up on renal function in am.    4. Thrombocytopenia. Risk vs benefit will continue anticoagulation with apixaban. cell count today with plt at 69 and hgb at 12,3  5. CAD/ dyslpidemia.troponin elevation due to heart failure exacerbation, no  acs pattern. No active  angina, contin withrosuvastatin.   6. Stage 2 sacrum pressure ulcer.Local skin care.Out of bed to chair tid with meals.  Plan for home health at discharge.   7. Reactive hyperglycemia. . Hgb A1c on 06/21 was 5,4.Fasting glucose this am is 195 mg/dl. Tolerating po well, continue insulins sliding scale.   Patient continue to be at high risk for  Worsening heart failure and hemodynamic decompensation   Status is: Inpatient  Remains inpatient appropriate because:IV treatments appropriate due to intensity of illness or inability to take PO   Dispo: The patient is from: Home              Anticipated d/c is to: Home              Anticipated d/c date is: 3 days              Patient currently is not medically stable to d/c.   DVT prophylaxis: apixaban   Code Status:   full  Family Communication:  No family at the bedside      Nutrition Status:           Skin Documentation: Pressure Injury 03/03/20 Sacrum Mid Stage 2 -  Partial thickness loss of dermis presenting as a shallow open injury with a red, pink wound bed without slough. (Active)  03/03/20 0226  Location: Sacrum  Location Orientation: Mid  Staging: Stage 2 -  Partial thickness loss of dermis presenting as a shallow open injury with a red, pink wound bed without slough.  Wound Description (Comments):   Present on Admission: Yes     Consultants:   Cardiology    Subjective: Patient with persistent dyspnea, improved but not yet back to baseline, edema lower extremities is getting better, no nausea or vomiting and no chest pain, this am is out of bed to chair.   Objective: Vitals:   03/07/20 0500 03/07/20 0520 03/07/20 0747 03/07/20 0748  BP:  (!) 129/50 96/76   Pulse:  92 99 100  Resp:  20 (!) 21 19  Temp:  98.1 F (36.7 C) 98 F (36.7 C)   TempSrc:  Oral Axillary   SpO2:  96% 95% 96%  Weight: 102.6 kg     Height:        Intake/Output Summary (Last 24 hours) at 03/07/2020 1451 Last data filed at  03/07/2020 1012 Gross per 24 hour  Intake 226.15 ml  Output 3550 ml  Net -3323.85 ml   Filed Weights   03/05/20 0336 03/06/20 0659 03/07/20 0500  Weight: 107.4 kg 108 kg 102.6 kg    Examination:   General: Not in pain or dyspnea, deconditioned  Neurology: Awake and alert, non focal  E ENT: mild pallor, no icterus, oral mucosa moist Cardiovascular: No JVD. S1-S2 present, rhythmic, no gallops, rubs, or murmurs. No lower extremity edema. Pulmonary: positive breath sounds bilaterally,with, no wheezing, rhonchi or rales. Decreased breath sounds at bases.  Gastrointestinal. Abdomen soft and non tender Skin. No rashes Musculoskeletal: no joint deformities     Data Reviewed: I have personally reviewed following labs and imaging studies  CBC: Recent Labs  Lab 03/02/20 1936 03/03/20 0327 03/04/20 0042 03/07/20 0228  WBC 8.1 6.8 7.1 5.7  HGB 13.7 12.6 13.2 12.3  HCT 43.9 40.3 41.9 38.8  MCV 102.6* 102.8* 101.5* 99.2  PLT 92* 72* 80* 69*   Basic Metabolic Panel: Recent Labs  Lab 03/02/20 1936 03/03/20 0327 03/04/20 0042 03/05/20 0113 03/05/20 1457 03/06/20 0124  03/06/20 1451 03/07/20 0228 03/07/20 1300  NA 140   < > 137 137 137  --  134*  --  136  K 4.2   < > 4.6 3.9 3.6  --  3.0*  --  3.3*  CL 102   < > 101 98 96*  --  92*  --  91*  CO2 27   < > 25 25 30   --  32  --  34*  GLUCOSE 153*   < > 219* 148* 254*  --  210*  --  195*  BUN 63*   < > 58* 59* 59*  --  57*  --  61*  CREATININE 2.75*   < > 2.38* 2.36* 2.30*  --  2.20*  --  2.42*  CALCIUM 9.9   < > 9.6 9.5 9.3  --  9.3  --  9.3  MG 2.5*  --   --   --   --  2.0  --  2.0  --    < > = values in this interval not displayed.   GFR: Estimated Creatinine Clearance: 20.5 mL/min (A) (by C-G formula based on SCr of 2.42 mg/dL (H)). Liver Function Tests: No results for input(s): AST, ALT, ALKPHOS, BILITOT, PROT, ALBUMIN in the last 168 hours. No results for input(s): LIPASE, AMYLASE in the last 168 hours. No results  for input(s): AMMONIA in the last 168 hours. Coagulation Profile: No results for input(s): INR, PROTIME in the last 168 hours. Cardiac Enzymes: No results for input(s): CKTOTAL, CKMB, CKMBINDEX, TROPONINI in the last 168 hours. BNP (last 3 results) No results for input(s): PROBNP in the last 8760 hours. HbA1C: No results for input(s): HGBA1C in the last 72 hours. CBG: No results for input(s): GLUCAP in the last 168 hours. Lipid Profile: No results for input(s): CHOL, HDL, LDLCALC, TRIG, CHOLHDL, LDLDIRECT in the last 72 hours. Thyroid Function Tests: No results for input(s): TSH, T4TOTAL, FREET4, T3FREE, THYROIDAB in the last 72 hours. Anemia Panel: No results for input(s): VITAMINB12, FOLATE, FERRITIN, TIBC, IRON, RETICCTPCT in the last 72 hours.    Radiology Studies: I have reviewed all of the imaging during this hospital visit personally     Scheduled Meds: . amiodarone  400 mg Oral BID  . apixaban  2.5 mg Oral BID  . digoxin  0.125 mg Oral QODAY  . metoprolol tartrate  12.5 mg Oral BID  . potassium chloride  20 mEq Oral TID  . rosuvastatin  10 mg Oral Daily  . sodium chloride flush  3 mL Intravenous Q12H   Continuous Infusions: . sodium chloride    . furosemide (LASIX) infusion 10 mg/hr (03/06/20 1600)     LOS: 5 days        Akshita Italiano Gerome Apley, MD

## 2020-03-08 ENCOUNTER — Encounter (HOSPITAL_COMMUNITY): Payer: Self-pay | Admitting: Family Medicine

## 2020-03-08 DIAGNOSIS — N179 Acute kidney failure, unspecified: Secondary | ICD-10-CM | POA: Diagnosis not present

## 2020-03-08 DIAGNOSIS — N1832 Chronic kidney disease, stage 3b: Secondary | ICD-10-CM | POA: Diagnosis not present

## 2020-03-08 DIAGNOSIS — L89302 Pressure ulcer of unspecified buttock, stage 2: Secondary | ICD-10-CM | POA: Diagnosis not present

## 2020-03-08 DIAGNOSIS — J9611 Chronic respiratory failure with hypoxia: Secondary | ICD-10-CM | POA: Diagnosis not present

## 2020-03-08 DIAGNOSIS — I5023 Acute on chronic systolic (congestive) heart failure: Secondary | ICD-10-CM | POA: Diagnosis not present

## 2020-03-08 LAB — BASIC METABOLIC PANEL
Anion gap: 12 (ref 5–15)
BUN: 65 mg/dL — ABNORMAL HIGH (ref 8–23)
CO2: 37 mmol/L — ABNORMAL HIGH (ref 22–32)
Calcium: 9.5 mg/dL (ref 8.9–10.3)
Chloride: 90 mmol/L — ABNORMAL LOW (ref 98–111)
Creatinine, Ser: 2.39 mg/dL — ABNORMAL HIGH (ref 0.44–1.00)
GFR, Estimated: 18 mL/min — ABNORMAL LOW (ref >60)
Glucose, Bld: 154 mg/dL — ABNORMAL HIGH (ref 70–99)
Potassium: 2.9 mmol/L — ABNORMAL LOW (ref 3.5–5.1)
Sodium: 139 mmol/L (ref 135–145)

## 2020-03-08 LAB — MAGNESIUM: Magnesium: 2.1 mg/dL (ref 1.7–2.4)

## 2020-03-08 MED ORDER — POTASSIUM CHLORIDE CRYS ER 20 MEQ PO TBCR
40.0000 meq | EXTENDED_RELEASE_TABLET | Freq: Three times a day (TID) | ORAL | Status: DC
  Administered 2020-03-08 – 2020-03-10 (×8): 40 meq via ORAL
  Filled 2020-03-08 (×5): qty 2
  Filled 2020-03-08: qty 4
  Filled 2020-03-08 (×4): qty 2

## 2020-03-08 NOTE — Progress Notes (Signed)
Occupational Therapy Treatment Patient Details Name: Carrie Monroe MRN: [id number] DOB: 1936/02/15 Today's Date: 03/08/2020    History of present illness 84 y.o. female presenting with weight gain, worsening of chronic BLE edema, and progressive dyspnea. Patient admitted with acute on chronic systolic CHF. Previous admission 11/2019 with new onset afib w/ RVR and acute on chronic CHF. PMH includes: 3-vessel CAD s/p DES PCI (2018), severe AS s/p TAVR (2018), chronic HFpEF, HTN, HLD, DMII, and CKD stage IIIb.    OT comments  Pt making limited progress towards OT goals this session. Pt able to perform transfers at min A with good hand placement for safety. Session focused on AE education with concern for retention. After demonstration, Pt unable to return demo, "I can't remember how we did it" Pt then assisted with grooming tasks (specifially getting knots out of hair) as well as reposition in chair with extra pillows (geomat ordered) for sacral wound which is especially painful today). I am especially concerned for this patient to go home, she will require 24 hour supervision, and if her daughter (who is coming from [place] and staying for?!?!?) cannot provide I do think she will require SNF to maximize safety and independence in ADL and functional transfers.    Follow Up Recommendations  Supervision/Assistance - 24 hour;Home health OT;SNF    Equipment Recommendations       Recommendations for Other Services      Precautions / Restrictions Precautions Precautions: Fall       Mobility Bed Mobility Overal bed mobility: Needs Assistance Bed Mobility: Sit to Supine       Sit to supine: Mod assist   General bed mobility comments: modA for LE management  Transfers Overall transfer level: Needs assistance Equipment used: Rolling walker (2 wheeled) Transfers: Sit to/from [facility] Sit to Stand: Min assist Stand pivot transfers: Min assist       General transfer  comment: good hand placement, RW for balance in standing, increased time to perform    Balance Overall balance assessment: Needs assistance Sitting-balance support: No upper extremity supported;Feet supported Sitting balance-Leahy Scale: Fair     Standing balance support: Bilateral upper extremity supported Standing balance-Leahy Scale: Poor Standing balance comment: reliant on UE support of RW                           ADL either performed or assessed with clinical judgement   ADL Overall ADL's : Needs assistance/impaired     Grooming: Wash/dry hands;Wash/dry face;Oral care;Brushing hair;Set up;Sitting Grooming Details (indicate cue type and reason): in recliner             Lower Body Dressing: Maximal assistance Lower Body Dressing Details (indicate cue type and reason): Pt unable to perform figure 4, initiated AE education                     Vision       Perception     Praxis      Cognition Arousal/Alertness: Awake/alert Behavior During Therapy: WFL for tasks assessed/performed Overall Cognitive Status: Impaired/Different from baseline Area of Impairment: Memory;Problem solving                     Memory: Decreased short-term memory       Problem Solving: Requires verbal cues General Comments: Pt unable to provide demo of AE at this time, "I don't remember how we did it earlier"  Exercises     Shoulder Instructions       General Comments SpO2 WFL on 2L throughout session    Pertinent Vitals/ Pain       Pain Assessment: Faces Faces Pain Scale: Hurts even more Pain Location: buttocks Pain Descriptors / Indicators: Sore;Grimacing;Discomfort Pain Intervention(s): Monitored during session;Repositioned;Other (comment) (ordered geo mat, placed 2 pillows in chair)  Home Living                                          Prior Functioning/Environment              Frequency  Min 3X/week         Progress Toward Goals  OT Goals(current goals can now be found in the care plan section)  Progress towards OT goals: Progressing toward goals  Acute Rehab OT Goals Patient Stated Goal: To improve activity tolerance and return to independence OT Goal Formulation: With patient Time For Goal Achievement: 03/20/20 Potential to Achieve Goals: Good  Plan Discharge plan remains appropriate;Frequency remains appropriate    Co-evaluation                 AM-PAC OT "6 Clicks" Daily Activity     Outcome Measure   Help from another person eating meals?: None Help from another person taking care of personal grooming?: A Little Help from another person toileting, which includes using toliet, bedpan, or urinal?: A Little Help from another person bathing (including washing, rinsing, drying)?: A Lot Help from another person to put on and taking off regular upper body clothing?: A Little Help from another person to put on and taking off regular lower body clothing?: A Lot 6 Click Score: 17    End of Session Equipment Utilized During Treatment: Gait belt;Rolling walker  OT Visit Diagnosis: Unsteadiness on feet (R26.81);Muscle weakness (generalized) (M62.81)   Activity Tolerance Patient tolerated treatment well   Patient Left in chair;with call bell/phone within reach   Nurse Communication Mobility status        Time: 3614-4315 OT Time Calculation (min): 43 min  Charges: OT General Charges $OT Visit: 1 Visit OT Treatments $Self Care/Home Management : 23-37 mins $Therapeutic Activity: 8-22 mins  Jesse Sans OTR/L Acute Rehabilitation Services Pager: [phone] Office: [place] 03/08/2020, 10:54 AM

## 2020-03-08 NOTE — Progress Notes (Signed)
Mobility Specialist - Progress Note   03/08/20 1450  Mobility  Activity Transferred:  Chair to bed  Level of Assistance Modified independent, requires aide device or extra time  Assistive Device Front wheel walker  Mobility Response Tolerated fair  Mobility performed by Mobility specialist  $Mobility charge 1 Mobility    Pre-mobility: 75 HR, 113/54 BP, 97% SpO2 Post-mobility: 84 HR, 92/52 BP, 95% SpO2  Pt states she has been feeling lightheaded and "foggy" today. She was able to ambulate w/o assistance.  Pricilla Handler Mobility Specialist Mobility Specialist Phone: [phone]

## 2020-03-08 NOTE — Progress Notes (Signed)
PROGRESS NOTE    Carrie Monroe  ZOX:[id number] DOB: February 09, 1936 DOA: 03/02/2020 PCP: Marton Redwood, MD    Brief Narrative:  Patient admitted with the working diagnosis of acute on chronic systolic heart failure exacerbation, complicated with atrial fibrillation with rapid ventricular response  84 yo female with past medical history ofatrial fibrillation, systolic heart failure, severe aortic stenosis status post TAVR,andchronic kidney disease stage IIIb who presented with dyspnea and weight gain. Patient reported progressive, and worsening lower extremity edema, orthopnea, dyspnea on exertion and 11 pound weight gain. Her symptoms were refractive to increased dose of furosemide as an outpatient. Due to worsening symptoms she called EMS, she was found in atrial fibrillation with rapid ventricular response, she received 10 mg of IV diltiazem on route before arriving to the ED. On her initial physical examination she was tachycardic 131 bpm, tachypneic 26 breaths/min and hypoxic, oxygen saturation 80% on 2 L per nasal cannula, blood pressure 116/63, positive JVD, lungs with no wheezing, heart S1-S2 present, tachycardic, irregularly irregular, abdomen soft, Positive3+ bilateral lower extremity pitting edema. Sodium 140, potassium 4.2, chloride 102, bicarb 27, glucose 153, BUN 63, creatinine 2.79, magnesium 2.5, BNP 529, white count 8.1, hemoglobin 13.7, hematocrit 43.9, platelets 92. SARS COVID-19 negative. Urinalysis negative for infection,specific gravity 1.009. Chest radiograph with bilateral interstitial infiltrates at the lower zones, positive small bilateral pleural effusions. EKG 136 bpm, left axis deviation, left bundle branch block, measured QTC 400 ms, atrial fibrillation rhythm, noisy baseline, poor R wave progression, no significant ST segment changes, V5-V6 T wave inversion.  Patient was placed on diltiazem infusion and aggressive diuresis with furosemide.   Diltiazem  was changed toamiodarone per cariology recommendations with good toleration.  She has severehypervolemia, placed onfurosemide drip to target further negative fluid balance.(metolazone x2 on 10/08-10/11)    Assessment & Plan:   Principal Problem:   Acute on chronic systolic CHF (congestive heart failure) (HCC) Active Problems:   CAD- severe 3V CAD    Chronic respiratory failure with hypoxia (HCC)   Atrial fibrillation with rapid ventricular response (HCC)   Secondary hypercoagulable state ([place])   Acute renal failure superimposed on stage 3b chronic kidney disease (HCC)   Thrombocytopenia (HCC)   Pressure injury of skin   1. Acute on chronic systolic heart failure exacerbation/ AS sp TAVR.Echocardiogram from 06/21 with LV EF 40 to 45%, mild decreased systolic function on the left and right ventricle.Bilateral severe atrial dilatation. Holding after load reduction or RAAS inhibition due to risk of hypotension.  Her urine outputover last 24 His 5,150 ml,stable serum cr up to 23,9 with better systolic blood pressure 811 to 130 mmHg.   Continue with furosemide drip, digoxin and metoprolol.     2. Atrial fibrillation with rapid ventricular rate. Tolerating well amiodarone per protocol, continue with apixaban for anticoagulation.   Poor mobility due to deconditioning, continue with physical therapy.    3. AKI on CKD stage 3b to 4/ hypokalemia. K today down to 2,9 with bicarbonate at 37 and Na at 139, Mg at 2,1.  Continue diuresis with furosemide, will increase Kcl to 40 meq tid, to target a K more than 4.   4. Thrombocytopenia. Risk vs benefit will continue anticoagulation with apixaban.Platelet has been 69 range.   5. CAD/ dyslpidemia.troponin elevation due to heart failure exacerbation, no acs pattern. Onrosuvastatin, she has no chest pain or angina.   6. Stage 2 sacrum pressure ulcer.Continue with local skin care.Continue to encourage out of bed to chair tid  with  meals. Patient very weak and deconditioned, PT/OT has recommended SNF patient prefers home health.   7. Reactive hyperglycemia. . Hgb A1c on 06/21 was 5,4. Fasting glucose is 154 today, capillary 115, 100, 150, will discontinue insulin therapy for now.     Patient continue to be at high risk for worsening heart failure, AKI and hypokalemia.   Status is: Inpatient  Remains inpatient appropriate because:IV treatments appropriate due to intensity of illness or inability to take PO   Dispo: The patient is from: Home              Anticipated d/c is to: Home              Anticipated d/c date is: 3 days              Patient currently is not medically stable to d/c. Patient continue to be hypervolemic.        DVT prophylaxis: apixaban    Code Status:    full Family Communication:  No family at the bedside      Nutrition Status:           Skin Documentation: Pressure Injury 03/03/20 Sacrum Mid Stage 2 -  Partial thickness loss of dermis presenting as a shallow open injury with a red, pink wound bed without slough. (Active)  03/03/20 0226  Location: Sacrum  Location Orientation: Mid  Staging: Stage 2 -  Partial thickness loss of dermis presenting as a shallow open injury with a red, pink wound bed without slough.  Wound Description (Comments):   Present on Admission: Yes     Consultants:  Cardiology    Subjective: Patient continue to have lower extremity edema and dyspnea, improved but not yet back to baseline, today she feels dizzy and foggy.   Objective: Vitals:   03/07/20 1636 03/07/20 2100 03/08/20 0806 03/08/20 1206  BP: 119/73 124/79 130/61 104/60  Pulse: 94 98 67 79  Resp: 18 20 17 16   Temp: 97.8 F (36.6 C) 98.4 F (36.9 C) 97.9 F (36.6 C) 97.6 F (36.4 C)  TempSrc: Oral Oral Oral Oral  SpO2: 95% 97%  98%  Weight:      Height:        Intake/Output Summary (Last 24 hours) at 03/08/2020 1448 Last data filed at 03/08/2020 1300 Gross per 24  hour  Intake 723 ml  Output 4800 ml  Net -4077 ml   Filed Weights   03/05/20 0336 03/06/20 0659 03/07/20 0500  Weight: 107.4 kg 108 kg 102.6 kg    Examination:   General: Not in pain or dyspnea, deconditioned  Neurology: Awake and alert, non focal  E ENT: no pallor, no icterus, oral mucosa moist Cardiovascular: Mild JVD. S1-S2 present, rhythmic, no gallops, rubs, or murmurs. ++ pitting bilateral extremity edema. Pulmonary: positive breath sounds bilaterally, with decreased breath sounds at bases with no wheezing, rhonchi or rales. Gastrointestinal. Abdomen soft and non tender Skin. No rashes Musculoskeletal: no joint deformities     Data Reviewed: I have personally reviewed following labs and imaging studies  CBC: Recent Labs  Lab 03/02/20 1936 03/03/20 0327 03/04/20 0042 03/07/20 0228  WBC 8.1 6.8 7.1 5.7  HGB 13.7 12.6 13.2 12.3  HCT 43.9 40.3 41.9 38.8  MCV 102.6* 102.8* 101.5* 99.2  PLT 92* 72* 80* 69*   Basic Metabolic Panel: Recent Labs  Lab 03/02/20 1936 03/03/20 0327 03/05/20 0113 03/05/20 1457 03/06/20 0124 03/06/20 1451 03/07/20 0228 03/07/20 1300 03/08/20 0128  NA 140   < >  137 137  --  134*  --  136 139  K 4.2   < > 3.9 3.6  --  3.0*  --  3.3* 2.9*  CL 102   < > 98 96*  --  92*  --  91* 90*  CO2 27   < > 25 30  --  32  --  34* 37*  GLUCOSE 153*   < > 148* 254*  --  210*  --  195* 154*  BUN 63*   < > 59* 59*  --  57*  --  61* 65*  CREATININE 2.75*   < > 2.36* 2.30*  --  2.20*  --  2.42* 2.39*  CALCIUM 9.9   < > 9.5 9.3  --  9.3  --  9.3 9.5  MG 2.5*  --   --   --  2.0  --  2.0  --  2.1   < > = values in this interval not displayed.   GFR: Estimated Creatinine Clearance: 20.8 mL/min (A) (by C-G formula based on SCr of 2.39 mg/dL (H)). Liver Function Tests: No results for input(s): AST, ALT, ALKPHOS, BILITOT, PROT, ALBUMIN in the last 168 hours. No results for input(s): LIPASE, AMYLASE in the last 168 hours. No results for input(s): AMMONIA  in the last 168 hours. Coagulation Profile: No results for input(s): INR, PROTIME in the last 168 hours. Cardiac Enzymes: No results for input(s): CKTOTAL, CKMB, CKMBINDEX, TROPONINI in the last 168 hours. BNP (last 3 results) No results for input(s): PROBNP in the last 8760 hours. HbA1C: No results for input(s): HGBA1C in the last 72 hours. CBG: No results for input(s): GLUCAP in the last 168 hours. Lipid Profile: No results for input(s): CHOL, HDL, LDLCALC, TRIG, CHOLHDL, LDLDIRECT in the last 72 hours. Thyroid Function Tests: No results for input(s): TSH, T4TOTAL, FREET4, T3FREE, THYROIDAB in the last 72 hours. Anemia Panel: No results for input(s): VITAMINB12, FOLATE, FERRITIN, TIBC, IRON, RETICCTPCT in the last 72 hours.    Radiology Studies: I have reviewed all of the imaging during this hospital visit personally     Scheduled Meds: . amiodarone  400 mg Oral BID  . apixaban  2.5 mg Oral BID  . digoxin  0.125 mg Oral QODAY  . metoprolol tartrate  12.5 mg Oral BID  . potassium chloride  20 mEq Oral TID  . rosuvastatin  10 mg Oral Daily  . sodium chloride flush  3 mL Intravenous Q12H   Continuous Infusions: . sodium chloride    . furosemide (LASIX) infusion 10 mg/hr (03/07/20 1616)     LOS: 6 days        Blessin Kanno Gerome Apley, MD

## 2020-03-08 NOTE — Progress Notes (Signed)
Progress Note  Patient Name: Carrie Monroe Date of Encounter: 03/08/2020  Primary Cardiologist: Sanda Klein, MD   Subjective   Carrie Monroe is an 84 year old female with a past medical history significant for severe AS (s/p TAVR 01/2017 - 26 mm Medtronic CorValve Evolut Pro), CAD (multi-vessel PCI in 2018),HFmrEF (EF 40-45%, Echo 6/21), persistent atrial fibrillation on Eliquis, breast CA s/p lumpectomy & XRT, T2DM, gout, HTN, and hyperlipidemia who presented 03/01/20 with decompensated HF and A fib RVR.  Through course has diuresed well with drip.  Patient notes that her pure-wick overflowed last night.  Patient known sacral pressure ulcer feels worse today.  Inpatient Medications    Scheduled Meds:  amiodarone  400 mg Oral BID   apixaban  2.5 mg Oral BID   digoxin  0.125 mg Oral QODAY   metoprolol tartrate  12.5 mg Oral BID   potassium chloride  20 mEq Oral TID   rosuvastatin  10 mg Oral Daily   sodium chloride flush  3 mL Intravenous Q12H   Continuous Infusions:  sodium chloride     furosemide (LASIX) infusion 10 mg/hr (03/07/20 1616)   PRN Meds: sodium chloride, acetaminophen, docusate sodium, ondansetron (ZOFRAN) IV, sodium chloride flush   Vital Signs    Vitals:   03/07/20 0748 03/07/20 1636 03/07/20 2100 03/08/20 0806  BP:  119/73 124/79 130/61  Pulse: 100 94 98 67  Resp: 19 18 20 17   Temp:  97.8 F (36.6 C) 98.4 F (36.9 C) 97.9 F (36.6 C)  TempSrc:  Oral Oral Oral  SpO2: 96% 95% 97%   Weight:      Height:        Intake/Output Summary (Last 24 hours) at 03/08/2020 0945 Last data filed at 03/08/2020 0846 Gross per 24 hour  Intake 243 ml  Output 5300 ml  Net -5057 ml   Filed Weights   03/05/20 0336 03/06/20 0659 03/07/20 0500  Weight: 107.4 kg 108 kg 102.6 kg    Telemetry    Atrial fibrillation - Personally Reviewed  ECG    No new- Personally Reviewed  Physical Exam   GEN: No acute distress.   Neck: Minimal JVD  at 90 degrees UOB to chiar Cardiac: Irregularly irregular, no murmurs, rubs, or gallops.  Respiratory: Clear to auscultation bilaterally. GI: Soft, nontender, non-distended  MS: +2 edema; improving Neuro:  Nonfocal  Psych: Normal affect   Labs    Chemistry Recent Labs  Lab 03/06/20 1451 03/07/20 1300 03/08/20 0128  NA 134* 136 139  K 3.0* 3.3* 2.9*  CL 92* 91* 90*  CO2 32 34* 37*  GLUCOSE 210* 195* 154*  BUN 57* 61* 65*  CREATININE 2.20* 2.42* 2.39*  CALCIUM 9.3 9.3 9.5  GFRNONAA 20* 18* 18*  ANIONGAP 10 11 12      Hematology Recent Labs  Lab 03/03/20 0327 03/04/20 0042 03/07/20 0228  WBC 6.8 7.1 5.7  RBC 3.92 4.13 3.91  HGB 12.6 13.2 12.3  HCT 40.3 41.9 38.8  MCV 102.8* 101.5* 99.2  MCH 32.1 32.0 31.5  MCHC 31.3 31.5 31.7  RDW 17.5* 17.5* 17.4*  PLT 72* 80* 69*   BNP Recent Labs  Lab 03/02/20 1936  BNP 529.1*    Radiology    No results found.  Cardiac Studies   No new studies  Patient Profile     84 y.o. female AS, CAD, HFmrEF, and A fib with decompensated HF.  Assessment & Plan    Persistent Atrial Fibrillation:  -  improved rate control with 0.125 digoxin q48 (renal failure), metoprolol 12.5 mg BID - 1Amiodarone 400 mg BID for one week then 200 mg daily (02/1820) - CHA2DS2-VASc 5 (age 15, gender, CHF, CAD) Eliquis 2.5 - when euvolemic and peri-discharge, may be reasonable for DCCV   Acute on Chronic Systolic Heart Failure (HFmrEF):  -Continue lasix drip 10  Minimal worsening noted in EF from prior TTE in 01/2019 (EF 45-50% --> EF 40-45%) - significant improvement but with electrolyte shifts, holding metolazone for today - likely will need DC on torsemide vs Bumex   Acute on CKD 3b:  - Creat stable with hyperkalemia - will DC metloazone - will impact HF therapy as above   CAD and HLD:  - S/p DES of the LAD and RCA in 2018. No angina, through course - on eliquis with no recent PCI, metoprolol tartrate 12.5 mg BID, and crestor 10 mg -  On statin, with most recent LDL cholesterol 67.    AS s/p TAVR: normal prosthesis function by recent echo   Discussed with patient, OT, and Hospitalist Colleagues  For questions or updates, please contact [facility] Please consult [email or web address] for contact info under Cardiology/STEMI.      Signed, Werner Lean, MD  03/08/2020, 9:45 AM

## 2020-03-08 NOTE — Progress Notes (Signed)
Physical Therapy Treatment Patient Details Name: Carrie Monroe MRN: [id number] DOB: [birth year] Today's Date: 03/08/2020    History of Present Illness 84 y.o. female presenting with weight gain, worsening of chronic BLE edema, and progressive dyspnea. Patient admitted with acute on chronic systolic CHF. Previous admission 11/2019 with new onset afib w/ RVR and acute on chronic CHF. PMH includes: 3-vessel CAD s/p DES PCI (2018), severe AS s/p TAVR (2018), chronic HFpEF, HTN, HLD, DMII, and CKD stage IIIb.     PT Comments    Pt seated in recliner.  Pt reports pain and discomfort in her bottom this session.  Post session placed waffle cushion in recliner to alleviate pressure and reduce pain.  Pt continues to benefit from SNF placement at d/c to maximize functional gains before returning home.  Pt has questionable support at home.     Follow Up Recommendations  SNF;Supervision/Assistance - 24 hour     Equipment Recommendations  None recommended by PT    Recommendations for Other Services       Precautions / Restrictions Precautions Precautions: Fall Restrictions Weight Bearing Restrictions: No    Mobility  Bed Mobility Overal bed mobility: Needs Assistance Bed Mobility: Sit to Supine       Sit to supine: Mod assist   General bed mobility comments: Pt seated in recliner on arrival.  Transfers Overall transfer level: Needs assistance Equipment used: Rolling walker (2 wheeled) Transfers: Sit to/from [facility] Sit to Stand: Min assist;Mod assist Stand pivot transfers: Min assist       General transfer comment: Cues for hand placement to and from seated surface.  Performed from recliner, straight back chair and bed side commode.  Pt slow and guarded.  Only instance she required mod assistance is from straight back chair with no arm rests.  Ambulation/Gait Ambulation/Gait assistance: Min guard Gait Distance (Feet): 12 Feet (x2, reports dizziness  after 12 ft and stopped to assess BP (96/77), After standing increased to 107/78.) Assistive device: Rolling walker (2 wheeled) Gait Pattern/deviations: Step-to pattern;Wide base of support;Trunk flexed Gait velocity: reduced   General Gait Details: Cues for scap retraction and upper trunk control.  Pt slow with flexed posture and wide BOS. Required cues for upper trunk control .  Limited due to mild dizziness and weakness.   Stairs             Wheelchair Mobility    Modified Rankin (Stroke Patients Only)       Balance Overall balance assessment: Needs assistance Sitting-balance support: No upper extremity supported;Feet supported Sitting balance-Leahy Scale: Fair     Standing balance support: Bilateral upper extremity supported Standing balance-Leahy Scale: Poor Standing balance comment: reliant on UE support of RW                            Cognition Arousal/Alertness: Awake/alert Behavior During Therapy: WFL for tasks assessed/performed Overall Cognitive Status: Impaired/Different from baseline Area of Impairment: Memory;Problem solving                     Memory: Decreased short-term memory       Problem Solving: Requires verbal cues General Comments: Pt asking where are we going?, despite being told she was returning to recliner.      Exercises      General Comments General comments (skin integrity, edema, etc.): SpO2 WFL on 2L throughout session      Pertinent Vitals/Pain Pain Assessment: Faces  Faces Pain Scale: Hurts little more Pain Location: buttocks Pain Descriptors / Indicators: Sore;Grimacing;Discomfort Pain Intervention(s): Monitored during session;Repositioned (placed geomat waffle cushion under patient bottom to reduce discomfort when sitting and relieve pressure.)    Home Living                      Prior Function            PT Goals (current goals can now be found in the care plan section) Acute Rehab  PT Goals Patient Stated Goal: To improve activity tolerance and return to independence Potential to Achieve Goals: Good Progress towards PT goals: Progressing toward goals    Frequency    Min 3X/week      PT Plan Current plan remains appropriate    Co-evaluation              AM-PAC PT "6 Clicks" Mobility   Outcome Measure  Help needed turning from your back to your side while in a flat bed without using bedrails?: A Little Help needed moving from lying on your back to sitting on the side of a flat bed without using bedrails?: A Lot Help needed moving to and from a bed to a chair (including a wheelchair)?: A Little Help needed standing up from a chair using your arms (e.g., wheelchair or bedside chair)?: A Little Help needed to walk in hospital room?: A Little Help needed climbing 3-5 steps with a railing? : A Lot 6 Click Score: 16    End of Session Equipment Utilized During Treatment: Oxygen;Gait belt Activity Tolerance: Patient tolerated treatment well Patient left: in chair;with call bell/phone within reach;with chair alarm set (replaced batteries as they were low.) Nurse Communication: Mobility status PT Visit Diagnosis: Unsteadiness on feet (R26.81);Muscle weakness (generalized) (M62.81)     Time: 6256-3893 PT Time Calculation (min) (ACUTE ONLY): 29 min  Charges:  $Gait Training: 8-22 mins $Therapeutic Activity: 8-22 mins                     Erasmo Leventhal , PTA Acute Rehabilitation Services Pager [phone] Office [phone]     Carrie Monroe 03/08/2020, 1:07 PM

## 2020-03-08 NOTE — TOC Initial Note (Signed)
Transition of Care (TOC) - Initial/Assessment Note  Marvetta Gibbons RN, BSN Transitions of Care Unit 4E- RN Case Manager See Treatment Team for direct phone #    Patient Details  Name: Carrie Monroe MRN: [id number] Date of Birth: 12/26/1935  Transition of Care [facility]) CM/SW Contact:    Dawayne Patricia, RN Phone Number: 03/08/2020, 3:01 PM  Clinical Narrative:                 Pt admitted with CHF/Afib, currently on lasix gtt. From home alone, recs for SNF however pt would like to return home with [facility]. CM spoke with pt at bedside regarding transition of care needs. Per conversation pt states she has home 02 with [employer] baseline 2L. She also has all needed DME at home.  Choice offered for [facility] agency Per CMS guidelines from medicare.gov website with star ratings (copy placed in shadow chart)- per pt she reports she has used [facility] in past and would like to use them again- will need HH orders placed- referral pending orders.  Per pt she reports her daughter from [place] coming on Sunday Oct. 17 to stay a week- therefor pt does not want to go to [facility] and wants to return home with [facility]. Patient states her daughter should be in on Sunday AM and can transport home (daughter can bring 02 tank from home per pt for transport)  Will need to call [facility] for [facility] referral once [place] orders placed. TOC to follow for transition needs.     Expected Discharge Plan: Skilled Nursing Facility Barriers to Discharge: Continued Medical Work up   Patient Goals and CMS Choice Patient states their goals for this hospitalization and ongoing recovery are:: return home and get stronger CMS Medicare.gov Compare Post Acute Care list provided to:: Patient Choice offered to / list presented to : Patient  Expected Discharge Plan and Services Expected Discharge Plan: [place]   Discharge Planning Services: CM Consult Post Acute Care Choice: [place] arrangements for the past 2  months: Single Family Home                   DME Agency: NA         HH Agency: [facility]        Prior Living Arrangements/Services Living arrangements for the past 2 months: Single Family Home Lives with:: Self Patient language and need for interpreter reviewed:: Yes        Need for Family Participation in Patient Care: Yes (Comment) Care giver support system in place?: Yes (comment) Current home services: DME Criminal Activity/Legal Involvement Pertinent to Current Situation/Hospitalization: No - Comment as needed  Activities of Daily Living Home Assistive Devices/Equipment: Oxygen, Cane (specify quad or straight), Eyeglasses, Dentures (specify type) (straight; shower chair; partials) ADL Screening (condition at time of admission) Patient's cognitive ability adequate to safely complete daily activities?: Yes Is the patient deaf or have difficulty hearing?: No Does the patient have difficulty seeing, even when wearing glasses/contacts?: No Does the patient have difficulty concentrating, remembering, or making decisions?: No Patient able to express need for assistance with ADLs?: Yes Does the patient have difficulty dressing or bathing?: Yes Independently performs ADLs?: No Communication: Independent Dressing (OT): Needs assistance Is this a change from baseline?: Change from baseline, expected to last >3 days Grooming: Independent Feeding: Independent Bathing: Needs assistance Is this a change from baseline?: Change from baseline, expected to last >3 days Toileting: Needs assistance Is this a change from  baseline?: Change from baseline, expected to last >3days In/Out Bed: Needs assistance Is this a change from baseline?: Change from baseline, expected to last >3 days Walks in Home: Needs assistance Is this a change from baseline?: Change from baseline, expected to last >3 days Does the patient have difficulty walking or climbing stairs?: Yes Weakness of Legs:  Both Weakness of Arms/Hands: None  Permission Sought/Granted Permission sought to share information with : Chartered certified accountant granted to share information with : Yes, Verbal Permission Granted     Permission granted to share info w AGENCY: [facility]        Emotional Assessment Appearance:: Appears stated age Attitude/Demeanor/Rapport: Engaged Affect (typically observed): Appropriate Orientation: : Oriented to Self, Oriented to Place, Oriented to  Time, Oriented to Situation Alcohol / Substance Use: Not Applicable Psych Involvement: No (comment)  Admission diagnosis:  Atrial fibrillation with rapid ventricular response (HCC) [I48.91] AKI (acute kidney injury) ([place]) [N17.9] Acute on chronic systolic CHF (congestive heart failure) (HCC) [I50.23] Acute on chronic combined systolic and diastolic CHF (congestive heart failure) ([place]) [I50.43] Patient Active Problem List   Diagnosis Date Noted  . Pressure injury of skin 03/03/2020  . Acute on chronic systolic CHF (congestive heart failure) ([place]) 03/02/2020  . Acute renal failure superimposed on stage 3b chronic kidney disease ([place]) 03/02/2020  . Thrombocytopenia ([place]) 03/02/2020  . Secondary hypercoagulable state ([place]) 01/13/2020  . Persistent atrial fibrillation ([place])   . Hypoxia   . Acute on chronic diastolic CHF (congestive heart failure), NYHA class 3 ([place]) 11/21/2019  . Atrial fibrillation with rapid ventricular response ([place]) 11/21/2019  . Fever   . Carotid stenosis 03/13/2019  . Encounter for medication monitoring 11/27/2017  . PICC (peripherally inserted central catheter) in place 11/27/2017  . MSSA bacteremia 11/05/2017  . Wound infection after surgery 11/04/2017  . Anemia 09/27/2017  . Hyperlipidemia   . History of kidney stones   . Hemorrhoids   . Exogenous obesity   . Chronic diastolic CHF (congestive heart failure) ([place])   . Arthritis   . Aortic stenosis, severe   . Chronic respiratory  failure with hypoxia ([place])   . Hemorrhagic shock ([place])   . 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 Carotid artery stenosis   . Three-vessel CAD-PCI to LAD and LCx, med management of RCA   . Chronic renal insufficiency, stage III (moderate) ([place]) 12/22/2016  . Dental abscess- s/p multiple tooth extraction 12/21/16 12/22/2016  . CAD- severe 3V CAD    . Essential hypertension 01/27/2016  . Morbid obesity due to excess calories ([place]) 01/27/2016  . Gout 06/22/2013  . Dyslipidemia 05/08/2011  . Severe aortic stenosis 05/08/2011  . Breast cancer of upper-outer quadrant of left female breast ([place]) 03/16/2011   PCP:  Marton Redwood, MD Pharmacy:   [facility], [address] [place] [address] Phone: [phone] Fax: [phone]  [facility]) [address] [place] [address] Phone: [phone] Fax: [phone]  [facility] [place], [place] - [address] [place] [address] Phone: [phone] Fax: [phone]  [id number] [address] [place] [address] Phone: [phone] Fax: [phone]     Social Determinants of Health (SDOH) Interventions    Readmission Risk Interventions No flowsheet data found.

## 2020-03-09 DIAGNOSIS — I5023 Acute on chronic systolic (congestive) heart failure: Secondary | ICD-10-CM | POA: Diagnosis not present

## 2020-03-09 DIAGNOSIS — J9611 Chronic respiratory failure with hypoxia: Secondary | ICD-10-CM | POA: Diagnosis not present

## 2020-03-09 DIAGNOSIS — N179 Acute kidney failure, unspecified: Secondary | ICD-10-CM | POA: Diagnosis not present

## 2020-03-09 DIAGNOSIS — N1832 Chronic kidney disease, stage 3b: Secondary | ICD-10-CM | POA: Diagnosis not present

## 2020-03-09 DIAGNOSIS — I4891 Unspecified atrial fibrillation: Secondary | ICD-10-CM | POA: Diagnosis not present

## 2020-03-09 LAB — BASIC METABOLIC PANEL
Anion gap: 9 (ref 5–15)
BUN: 66 mg/dL — ABNORMAL HIGH (ref 8–23)
CO2: 41 mmol/L — ABNORMAL HIGH (ref 22–32)
Calcium: 9.4 mg/dL (ref 8.9–10.3)
Chloride: 87 mmol/L — ABNORMAL LOW (ref 98–111)
Creatinine, Ser: 2.13 mg/dL — ABNORMAL HIGH (ref 0.44–1.00)
GFR, Estimated: 21 mL/min — ABNORMAL LOW (ref >60)
Glucose, Bld: 146 mg/dL — ABNORMAL HIGH (ref 70–99)
Potassium: 3.3 mmol/L — ABNORMAL LOW (ref 3.5–5.1)
Sodium: 137 mmol/L (ref 135–145)

## 2020-03-09 NOTE — Progress Notes (Signed)
PT Cancellation Note  Patient Details Name: Carrie Monroe MRN: [id number] DOB: December 29, 1935   Cancelled Treatment:    Reason Eval/Treat Not Completed: (P) Medical issues which prohibited therapy (Pt sitting in chair reports she continues to feel dizzy, will f/u later this pm)   Declan Adamson Eli Hose 03/09/2020, 10:57 AM

## 2020-03-09 NOTE — Progress Notes (Signed)
Mobility Specialist - Progress Note   03/09/20 1530  Mobility  Activity Refused mobility    Pt states is fatigued from PT/OT earlier as well as having recently transferred from the chair to her bed.    Pricilla Handler Mobility Specialist Mobility Specialist Phone: [phone]

## 2020-03-09 NOTE — Progress Notes (Signed)
Occupational Therapy Treatment Patient Details Name: Carrie Monroe MRN: [id number] DOB: 1935-12-16 Today's Date: 03/09/2020    History of present illness 84 y.o. female presenting with weight gain, worsening of chronic BLE edema, and progressive dyspnea. Patient admitted with acute on chronic systolic CHF. Previous admission 11/2019 with new onset afib w/ RVR and acute on chronic CHF. PMH includes: 3-vessel CAD s/p DES PCI (2018), severe AS s/p TAVR (2018), chronic HFpEF, HTN, HLD, DMII, and CKD stage IIIb.    OT comments  Patient seen this date for basic ADL and mobility status.  Generalized weakness, dizziness, R shoulder pain, decreased balance, and mild declines to cognition impact ADL and mobility status in the acute setting.  Patient lives alone, her daughter is planning on staying with her for a week.  If the patient improves her mobility, home with [facility] may be possible.  Otherwise, SNF would be a better option to increase safety before home.  Continue to see in the acute setting.     Follow Up Recommendations  Supervision/Assistance - 24 hour;SNF    Equipment Recommendations  None recommended by OT    Recommendations for Other Services      Precautions / Restrictions Precautions Precautions: Fall Restrictions Weight Bearing Restrictions: No              ADL either performed or assessed with clinical judgement   ADL       Grooming: Wash/dry hands;Wash/dry face;Oral care;Brushing hair;Set up;Sitting               Lower Body Dressing: Maximal assistance   Toilet Transfer: Moderate assistance Toilet Transfer Details (indicate cue type and reason): practiced transfer training.  Mod A for initial lift off, and c/o dizziness.         Functional mobility during ADLs: Minimal assistance                          Frequency  Min 3X/week        Progress Toward Goals  OT Goals(current goals can now be found in the care plan section)     Acute  Rehab OT Goals Patient Stated Goal: Patient would like to get back to driving OT Goal Formulation: With patient Time For Goal Achievement: 03/20/20 Potential to Achieve Goals: Good  Plan      Co-evaluation                 AM-PAC OT "6 Clicks" Daily Activity     Outcome Measure   Help from another person eating meals?: None Help from another person taking care of personal grooming?: A Little Help from another person toileting, which includes using toliet, bedpan, or urinal?: A Little Help from another person bathing (including washing, rinsing, drying)?: A Lot Help from another person to put on and taking off regular upper body clothing?: A Little Help from another person to put on and taking off regular lower body clothing?: A Lot 6 Click Score: 17    End of Session Equipment Utilized During Treatment: Rolling walker  OT Visit Diagnosis: Unsteadiness on feet (R26.81);Muscle weakness (generalized) (M62.81)   Activity Tolerance Other (comment) (limited by dizziness)   Patient Left in chair;with call bell/phone within reach   Nurse Communication          Time: 5329-9242 OT Time Calculation (min): 16 min  Charges: OT General Charges $OT Visit: 1 Visit OT Treatments $Self Care/Home Management : 8-22 mins  03/09/2020  Rich,  OTR/L  Acute Rehabilitation Services  Office:  [phone]    Metta Clines 03/09/2020, 10:28 AM

## 2020-03-09 NOTE — Plan of Care (Signed)
  Problem: Clinical Measurements: Goal: Respiratory complications will improve Outcome: Not Progressing   

## 2020-03-09 NOTE — Progress Notes (Signed)
Progress Note  Patient Name: Carrie Monroe Date of Encounter: 03/09/2020  Primary Cardiologist: Sanda Klein, MD   Subjective   Carrie Monroe is an 84 year old female with a past medical history significant for severe AS (s/p TAVR 01/2017 - 26 mm Medtr[employer] CorValve Evolut Pro), CAD (multi-vessel PCI in 2018),HFmrEF (EF 40-45%, Echo 6/21), persistent atrial fibrillation on Eliquis, breast CA s/p lumpectomy & XRT, T2DM, gout, HTN, and hyperlipidemia who presented 03/01/20 with decompensated HF and A fib RVR.  Through course has diuresed well with drip and occasional metolazone  Patient notes issues with her PureWick.  Still has persistent dizziness.  No changes with improvement in hypervolemia.  Discussing with nursing team; may have been some previous bed pan voids not accounted for in present I/Os.  Inpatient Medications    Scheduled Meds:  amiodarone  400 mg Oral BID   apixaban  2.5 mg Oral BID   digoxin  0.125 mg Oral QODAY   metoprolol tartrate  12.5 mg Oral BID   potassium chloride  40 mEq Oral TID   rosuvastatin  10 mg Oral Daily   sodium chloride flush  3 mL Intravenous Q12H   Continuous Infusions:  sodium chloride     furosemide (LASIX) infusion 10 mg/hr (03/08/20 1645)   PRN Meds: sodium chloride, acetaminophen, docusate sodium, ondansetron (ZOFRAN) IV, sodium chloride flush   Vital Signs    Vitals:   03/09/20 0600 03/09/20 0610 03/09/20 0611 03/09/20 0822  BP:  114/84  109/83  Pulse:  80  93  Resp: 20 16 17 18   Temp:  97.8 F (36.6 C)  98.7 F (37.1 C)  TempSrc:  Oral  Oral  SpO2:  96%    Weight:   98.4 kg   Height:        Intake/Output Summary (Last 24 hours) at 03/09/2020 1010 Last data filed at 03/09/2020 0837 Gross per 24 hour  Intake 2310 ml  Output 2450 ml  Net -140 ml   Filed Weights   03/06/20 0659 03/07/20 0500 03/09/20 0611  Weight: 108 kg 102.6 kg 98.4 kg    Telemetry    Atrial fibrillation - Personally Reviewed  ECG      No new- Personally Reviewed  Physical Exam   GEN: No acute distress.   Neck: Minimal JVD at 90 degrees UOB to chiar Cardiac: Irregularly irregular, no murmurs, rubs, or gallops.  Respiratory: Clear to auscultation bilaterally. GI: Soft, nontender, non-distended  MS: +2 edema; improving still Neuro:  Nonfocal  Psych: Normal affect   Labs    Chemistry Recent Labs  Lab 03/07/20 1300 03/08/20 0128 03/09/20 0328  NA 136 139 137  K 3.3* 2.9* 3.3*  CL 91* 90* 87*  CO2 34* 37* 41*  GLUCOSE 195* 154* 146*  BUN 61* 65* 66*  CREATININE 2.42* 2.39* 2.13*  CALCIUM 9.3 9.5 9.4  GFRNONAA 18* 18* 21*  ANIONGAP 11 12 9      Hematology Recent Labs  Lab 03/03/20 0327 03/04/20 0042 03/07/20 0228  WBC 6.8 7.1 5.7  RBC 3.92 4.13 3.91  HGB 12.6 13.2 12.3  HCT 40.3 41.9 38.8  MCV 102.8* 101.5* 99.2  MCH 32.1 32.0 31.5  MCHC 31.3 31.5 31.7  RDW 17.5* 17.5* 17.4*  PLT 72* 80* 69*   BNP Recent Labs  Lab 03/02/20 1936  BNP 529.1*    Radiology    No results found.  Cardiac Studies   No new studies  Patient Profile  84 y.o. female AS, CAD, HFmrEF, and A fib with decompensated HF.  Assessment & Plan    Persistent Atrial Fibrillation:  - improved rate control with 0.125 digoxin q48 (renal failure), metoprolol 12.5 mg BID - 1Amiodarone 400 mg BID for one week then 200 mg daily (02/1820) - CHA2DS2-VASc 5 (age 87, gender, CHF, CAD) Eliquis 2.5 - when euvolemic and peri-discharge, may be reasonable for DCCV   Acute on Chronic Systolic Heart Failure (HFmrEF):  -Continue lasix drip 10  Minimal worsening noted in EF from prior TTE in 01/2019 (EF 45-50% --> EF 40-45%) - significant improvement but with electrolyte shifts - likely will need DC on torsemide vs Bumex - team working to optimize strict I/Os   Acute on CKD 3b:  - Creat stable with hyperkalemia - will impact HF therapy as above   CAD and HLD:  - S/p DES of the LAD and RCA in 2018. No angina, through  course - on eliquis with no recent PCI, metoprolol tartrate 12.5 mg BID, and crestor 10 mg - On statin, with most recent LDL cholesterol 67.    AS s/p TAVR: normal prosthesis function by recent echo   For questions or updates, please contact [facility] Please consult [email or web address] for contact info under Cardiology/STEMI.      Signed, Werner Lean, MD  03/09/2020, 10:10 AM

## 2020-03-09 NOTE — Progress Notes (Signed)
Physical Therapy Treatment Patient Details Name: Carrie Monroe MRN: [id number] DOB: 09-28-1935 Today's Date: 03/09/2020    History of Present Illness 84 y.o. female presenting with weight gain, worsening of chronic BLE edema, and progressive dyspnea. Patient admitted with acute on chronic systolic CHF. Previous admission 11/2019 with new onset afib w/ RVR and acute on chronic CHF. PMH includes: 3-vessel CAD s/p DES PCI (2018), severe AS s/p TAVR (2018), chronic HFpEF, HTN, HLD, DMII, and CKD stage IIIb.     PT Comments    Pt seated in recliner this session.  Pt required assistance to move into standing and ambulate short distance in her room.  She continues to complain of dizziness.  Continue to recommend snf at d/c.     Follow Up Recommendations  SNF;Supervision/Assistance - 24 hour     Equipment Recommendations  None recommended by PT    Recommendations for Other Services       Precautions / Restrictions Precautions Precautions: Fall Restrictions Weight Bearing Restrictions: No    Mobility  Bed Mobility Overal bed mobility: Needs Assistance Bed Mobility: Supine to Sit;Sit to Supine       Sit to supine: Mod assist;+2 for physical assistance   General bed mobility comments: Mod +2 to return to supine with assistance to lower trunk and lift B LEs back to bed against gravity.  Transfers Overall transfer level: Needs assistance Equipment used: Rolling walker (2 wheeled) Transfers: Sit to/from Stand Sit to Stand: Min assist;Mod assist         General transfer comment: Min assistance from recliner and mod assistance from commode.  Cues for hand placement to and from seated surface.  Ambulation/Gait Ambulation/Gait assistance: Min guard Gait Distance (Feet): 12 Feet Assistive device: Rolling walker (2 wheeled) Gait Pattern/deviations: Step-to pattern;Wide base of support;Trunk flexed Gait velocity: reduced   General Gait Details: Cues for scap retraction and  upper trunk control.  Pt limited as she continues to c/o dizziness.  SPo2 stable on 2L.   Stairs             Wheelchair Mobility    Modified Rankin (Stroke Patients Only)       Balance Overall balance assessment: Needs assistance Sitting-balance support: No upper extremity supported;Feet supported Sitting balance-Leahy Scale: Fair       Standing balance-Leahy Scale: Poor                              Cognition Arousal/Alertness: Awake/alert Behavior During Therapy: WFL for tasks assessed/performed Overall Cognitive Status: Impaired/Different from baseline Area of Impairment: Problem solving                     Memory: Decreased short-term memory       Problem Solving: Requires verbal cues General Comments: Pt required constant VCs throughout      Exercises      General Comments        Pertinent Vitals/Pain Pain Assessment: Faces Faces Pain Scale: Hurts little more Pain Location: buttocks Pain Descriptors / Indicators: Sore;Grimacing;Discomfort Pain Intervention(s): Monitored during session;Repositioned    Home Living                      Prior Function            PT Goals (current goals can now be found in the care plan section) Acute Rehab PT Goals Patient Stated Goal: Patient would like to get back  to driving Potential to Achieve Goals: Good Progress towards PT goals: Progressing toward goals    Frequency    Min 3X/week      PT Plan Current plan remains appropriate    Co-evaluation              AM-PAC PT "6 Clicks" Mobility   Outcome Measure  Help needed turning from your back to your side while in a flat bed without using bedrails?: A Little Help needed moving from lying on your back to sitting on the side of a flat bed without using bedrails?: A Lot Help needed moving to and from a bed to a chair (including a wheelchair)?: A Little Help needed standing up from a chair using your arms (e.g.,  wheelchair or bedside chair)?: A Little Help needed to walk in hospital room?: A Little Help needed climbing 3-5 steps with a railing? : A Lot 6 Click Score: 16    End of Session Equipment Utilized During Treatment: Oxygen;Gait belt Activity Tolerance: Patient tolerated treatment well Patient left: in chair;with call bell/phone within reach;with chair alarm set Nurse Communication: Mobility status PT Visit Diagnosis: Unsteadiness on feet (R26.81);Muscle weakness (generalized) (M62.81)     Time: 2446-9507 PT Time Calculation (min) (ACUTE ONLY): 30 min  Charges:  $Gait Training: 8-22 mins $Therapeutic Activity: 8-22 mins                     Erasmo Leventhal , PTA Acute Rehabilitation Services Pager [phone] Office [phone]     Lalo Tromp Eli Hose 03/09/2020, 2:32 PM

## 2020-03-09 NOTE — Progress Notes (Signed)
PROGRESS NOTE    Carrie Monroe  VVO:[id number] DOB: 1936/05/09 DOA: 03/02/2020 PCP: Marton Redwood, MD    Brief Narrative:  Patient admitted with the working diagnosis of acute on chronic systolic heart failure exacerbation, complicated with atrial fibrillation with rapid ventricular response  84 yo female with past medical history ofatrial fibrillation, systolic heart failure, severe aortic stenosis status post TAVR,andchronic kidney disease stage IIIb who presented with dyspnea and weight gain. Patient reported progressive, and worsening lower extremity edema, orthopnea, dyspnea on exertion and 11 pound weight gain. Her symptoms were refractive to increased dose of furosemide as an outpatient. Due to worsening symptoms she called EMS, she was found in atrial fibrillation with rapid ventricular response, she received 10 mg of IV diltiazem on route before arriving to the ED. On her initial physical examination she was tachycardic 131 bpm, tachypneic 26 breaths/min and hypoxic, oxygen saturation 80% on 2 L per nasal cannula, blood pressure 116/63, positive JVD, lungs with no wheezing, heart S1-S2 present, tachycardic, irregularly irregular, abdomen soft, Positive3+ bilateral lower extremity pitting edema. Sodium 140, potassium 4.2, chloride 102, bicarb 27, glucose 153, BUN 63, creatinine 2.79, magnesium 2.5, BNP 529, white count 8.1, hemoglobin 13.7, hematocrit 43.9, platelets 92. SARS COVID-19 negative. Urinalysis negative for infection,specific gravity 1.009. Chest radiograph with bilateral interstitial infiltrates at the lower zones, positive small bilateral pleural effusions. EKG 136 bpm, left axis deviation, left bundle branch block, measured QTC 400 ms, atrial fibrillation rhythm, noisy baseline, poor R wave progression, no significant ST segment changes, V5-V6 T wave inversion.  Patient was placed on diltiazem infusion and aggressive diuresis with furosemide.   Diltiazem  was changed toamiodarone per cariology recommendations with good toleration.  She has severehypervolemia, placed onfurosemide drip to target further negative fluid balance.(metolazone x2on 10/08-10/11)   Assessment & Plan:   Principal Problem:   Acute on chronic systolic CHF (congestive heart failure) (HCC) Active Problems:   CAD- severe 3V CAD    Chronic respiratory failure with hypoxia (HCC)   Atrial fibrillation with rapid ventricular response (HCC)   Secondary hypercoagulable state ([place])   Acute renal failure superimposed on stage 3b chronic kidney disease (HCC)   Thrombocytopenia (HCC)   Pressure injury of skin    1. Acute on chronic systolic heart failure exacerbation/ AS sp TAVR.Echocardiogram from 06/21 with LV EF 40 to 45%, mild decreased systolic function on the left and right ventricle.Bilateral severe atrial dilatation. Holding after load reduction or RAAS inhibition due to risk of hypotension.  Documented urine outputover last 24 His3,450 ml,renal function with serum cr a 2,13 with K at 3,3, systolic blood pressure in the low 100's  Patient complains of dizziness, edema not yet back to normal, continue with aggressive diuresis with furosemide drip.  Continue heart failure management with digoxin and metoprolol.     2. Atrial fibrillation with rapid ventricular rate.Continue with amiodarone per protocol,on apixaban for anticoagulation.   Out of bed to chair tid with meals, continue PT/OT  3. AKI on CKD stage 3b to 4/ hypokalemia. Stable renal function with serum cr at 2.13 with K at 3,3 and bicarbonate at 41.  Continue K correction with Kcl 40 meq tid, follow up on renal function and electrolytes in am.   4. Thrombocytopenia. Risk vs benefit will continue anticoagulation with apixaban.Platelet has been 69 range.   5. CAD/ dyslpidemia.troponin elevation due to heart failure exacerbation, no acs pattern. Continue withrosuvastatin.   6.  Stage 2 sacrum pressure ulcer/ present on admission.On local skin  care. Out of bed to chair tid with meals.PT/OT  7. Reactive hyperglycemia. . Hgb A1c on 06/21 was 5,4. fasting insulin 145, now off insulin with good toleration.     Patient continue to be at high risk for worsening heart failure   Status is: Inpatient  Remains inpatient appropriate because:IV treatments appropriate due to intensity of illness or inability to take PO   Dispo: The patient is from: Home              Anticipated d/c is to: Home              Anticipated d/c date is: 3 days              Patient currently is not medically stable to d/c.   DVT prophylaxis: apixaban   Code Status:    full  Family Communication:  No family at the bedside          Skin Documentation: Pressure Injury 03/03/20 Sacrum Mid Stage 2 -  Partial thickness loss of dermis presenting as a shallow open injury with a red, pink wound bed without slough. (Active)  03/03/20 0226  Location: Sacrum  Location Orientation: Mid  Staging: Stage 2 -  Partial thickness loss of dermis presenting as a shallow open injury with a red, pink wound bed without slough.  Wound Description (Comments):   Present on Admission: Yes     Consultants:   Cardiology       Subjective: Patient is feeling better, positive dizziness when moving, edema and dyspnea are better but not yet back to baseline, no nausea or vomiting,   Objective: Vitals:   03/09/20 0610 03/09/20 0611 03/09/20 0822 03/09/20 1207  BP: 114/84  109/83 106/63  Pulse: 80  93 78  Resp: 16 17 18 17   Temp: 97.8 F (36.6 C)  98.7 F (37.1 C) 97.8 F (36.6 C)  TempSrc: Oral  Oral Oral  SpO2: 96%   97%  Weight:  98.4 kg    Height:        Intake/Output Summary (Last 24 hours) at 03/09/2020 1347 Last data filed at 03/09/2020 1154 Gross per 24 hour  Intake 1830 ml  Output 3400 ml  Net -1570 ml   Filed Weights   03/06/20 0659 03/07/20 0500 03/09/20 0611  Weight: 108  kg 102.6 kg 98.4 kg    Examination:   General: Not in pain or dyspnea, deconditioned  Neurology: Awake and alert, non focal  E ENT: positive pallor, no icterus, oral mucosa moist Cardiovascular: No JVD. S1-S2 present, rhythmic, no gallops, rubs, or murmurs. No lower extremity edema. Pulmonary: positive breath sounds bilaterally, no wheezing, rhonchi or rales. Gastrointestinal. Abdomen protuberant, soft and non tender Skin. No rashes Musculoskeletal: no joint deformities     Data Reviewed: I have personally reviewed following labs and imaging studies  CBC: Recent Labs  Lab 03/02/20 1936 03/03/20 0327 03/04/20 0042 03/07/20 0228  WBC 8.1 6.8 7.1 5.7  HGB 13.7 12.6 13.2 12.3  HCT 43.9 40.3 41.9 38.8  MCV 102.6* 102.8* 101.5* 99.2  PLT 92* 72* 80* 69*   Basic Metabolic Panel: Recent Labs  Lab 03/02/20 1936 03/03/20 0327 03/05/20 1457 03/06/20 0124 03/06/20 1451 03/07/20 0228 03/07/20 1300 03/08/20 0128 03/09/20 0328  NA 140   < > 137  --  134*  --  136 139 137  K 4.2   < > 3.6  --  3.0*  --  3.3* 2.9* 3.3*  CL 102   < >  96*  --  92*  --  91* 90* 87*  CO2 27   < > 30  --  32  --  34* 37* 41*  GLUCOSE 153*   < > 254*  --  210*  --  195* 154* 146*  BUN 63*   < > 59*  --  57*  --  61* 65* 66*  CREATININE 2.75*   < > 2.30*  --  2.20*  --  2.42* 2.39* 2.13*  CALCIUM 9.9   < > 9.3  --  9.3  --  9.3 9.5 9.4  MG 2.5*  --   --  2.0  --  2.0  --  2.1  --    < > = values in this interval not displayed.   GFR: Estimated Creatinine Clearance: 22.8 mL/min (A) (by C-G formula based on SCr of 2.13 mg/dL (H)). Liver Function Tests: No results for input(s): AST, ALT, ALKPHOS, BILITOT, PROT, ALBUMIN in the last 168 hours. No results for input(s): LIPASE, AMYLASE in the last 168 hours. No results for input(s): AMMONIA in the last 168 hours. Coagulation Profile: No results for input(s): INR, PROTIME in the last 168 hours. Cardiac Enzymes: No results for input(s): CKTOTAL, CKMB,  CKMBINDEX, TROPONINI in the last 168 hours. BNP (last 3 results) No results for input(s): PROBNP in the last 8760 hours. HbA1C: No results for input(s): HGBA1C in the last 72 hours. CBG: No results for input(s): GLUCAP in the last 168 hours. Lipid Profile: No results for input(s): CHOL, HDL, LDLCALC, TRIG, CHOLHDL, LDLDIRECT in the last 72 hours. Thyroid Function Tests: No results for input(s): TSH, T4TOTAL, FREET4, T3FREE, THYROIDAB in the last 72 hours. Anemia Panel: No results for input(s): VITAMINB12, FOLATE, FERRITIN, TIBC, IRON, RETICCTPCT in the last 72 hours.    Radiology Studies: I have reviewed all of the imaging during this hospital visit personally     Scheduled Meds: . amiodarone  400 mg Oral BID  . apixaban  2.5 mg Oral BID  . digoxin  0.125 mg Oral QODAY  . metoprolol tartrate  12.5 mg Oral BID  . potassium chloride  40 mEq Oral TID  . rosuvastatin  10 mg Oral Daily  . sodium chloride flush  3 mL Intravenous Q12H   Continuous Infusions: . sodium chloride    . furosemide (LASIX) infusion 10 mg/hr (03/08/20 1645)     LOS: 7 days        [place] Chelf Gerome Apley, MD

## 2020-03-10 DIAGNOSIS — J9611 Chronic respiratory failure with hypoxia: Secondary | ICD-10-CM | POA: Diagnosis not present

## 2020-03-10 DIAGNOSIS — N179 Acute kidney failure, unspecified: Secondary | ICD-10-CM | POA: Diagnosis not present

## 2020-03-10 DIAGNOSIS — I4891 Unspecified atrial fibrillation: Secondary | ICD-10-CM | POA: Diagnosis not present

## 2020-03-10 DIAGNOSIS — I5023 Acute on chronic systolic (congestive) heart failure: Secondary | ICD-10-CM | POA: Diagnosis not present

## 2020-03-10 LAB — BASIC METABOLIC PANEL
Anion gap: 11 (ref 5–15)
BUN: 69 mg/dL — ABNORMAL HIGH (ref 8–23)
CO2: 39 mmol/L — ABNORMAL HIGH (ref 22–32)
Calcium: 9.7 mg/dL (ref 8.9–10.3)
Chloride: 89 mmol/L — ABNORMAL LOW (ref 98–111)
Creatinine, Ser: 1.81 mg/dL — ABNORMAL HIGH (ref 0.44–1.00)
GFR, Estimated: 26 mL/min — ABNORMAL LOW (ref >60)
Glucose, Bld: 133 mg/dL — ABNORMAL HIGH (ref 70–99)
Potassium: 3.4 mmol/L — ABNORMAL LOW (ref 3.5–5.1)
Sodium: 139 mmol/L (ref 135–145)

## 2020-03-10 LAB — MAGNESIUM: Magnesium: 2 mg/dL (ref 1.7–2.4)

## 2020-03-10 MED ORDER — MAGNESIUM HYDROXIDE 400 MG/5ML PO SUSP
15.0000 mL | Freq: Every day | ORAL | Status: DC | PRN
  Filled 2020-03-10: qty 30

## 2020-03-10 MED ORDER — POLYETHYLENE GLYCOL 3350 17 G PO PACK
17.0000 g | PACK | Freq: Two times a day (BID) | ORAL | Status: DC
  Administered 2020-03-10 – 2020-03-15 (×4): 17 g via ORAL
  Filled 2020-03-10 (×10): qty 1

## 2020-03-10 MED ORDER — DOCUSATE SODIUM 100 MG PO CAPS
100.0000 mg | ORAL_CAPSULE | Freq: Every day | ORAL | Status: DC | PRN
  Administered 2020-03-10: 100 mg via ORAL
  Filled 2020-03-10: qty 1

## 2020-03-10 MED ORDER — BISACODYL 5 MG PO TBEC
10.0000 mg | DELAYED_RELEASE_TABLET | Freq: Once | ORAL | Status: AC
  Administered 2020-03-10: 10 mg via ORAL
  Filled 2020-03-10: qty 2

## 2020-03-10 MED ORDER — POTASSIUM CHLORIDE CRYS ER 20 MEQ PO TBCR
40.0000 meq | EXTENDED_RELEASE_TABLET | Freq: Once | ORAL | Status: AC
  Administered 2020-03-10: 40 meq via ORAL

## 2020-03-10 NOTE — Progress Notes (Signed)
PROGRESS NOTE    Carrie Monroe  HEN:[id number] DOB: 11/20/1935 DOA: 03/02/2020 PCP: Marton Redwood, MD    Brief Narrative:  Patient admitted with the working diagnosis of acute on chronic systolic heart failure exacerbation, complicated with atrial fibrillation with rapid ventricular response  84 yo female with past medical history ofatrial fibrillation, systolic heart failure, severe aortic stenosis status post TAVR,andchronic kidney disease stage IIIb who presented with dyspnea and weight gain. Patient reported progressive, and worsening lower extremity edema, orthopnea, dyspnea on exertion and 11 pound weight gain. Her symptoms were refractive to increased dose of furosemide as an outpatient. Due to worsening symptoms she called EMS, she was found in atrial fibrillation with rapid ventricular response, she received 10 mg of IV diltiazem on route before arriving to the ED. On her initial physical examination she was tachycardic 131 bpm, tachypneic 26 breaths/min and hypoxic, oxygen saturation 80% on 2 L per nasal cannula, blood pressure 116/63, positive JVD, lungs with no wheezing, heart S1-S2 present, tachycardic, irregularly irregular, abdomen soft, Positive3+ bilateral lower extremity pitting edema. Sodium 140, potassium 4.2, chloride 102, bicarb 27, glucose 153, BUN 63, creatinine 2.79, magnesium 2.5, BNP 529, white count 8.1, hemoglobin 13.7, hematocrit 43.9, platelets 92. SARS COVID-19 negative. Urinalysis negative for infection,specific gravity 1.009. Chest radiograph with bilateral interstitial infiltrates at the lower zones, positive small bilateral pleural effusions. EKG 136 bpm, left axis deviation, left bundle branch block, measured QTC 400 ms, atrial fibrillation rhythm, noisy baseline, poor R wave progression, no significant ST segment changes, V5-V6 T wave inversion.  Patient was placed on diltiazem infusion and aggressive diuresis with furosemide.   Diltiazem  was changed toamiodarone per cariology recommendations with good toleration.  She has severehypervolemia, placed onfurosemide drip to target further negative fluid balance.(metolazone x2on 10/08-10/11)   Assessment & Plan:   Principal Problem:   Acute on chronic systolic CHF (congestive heart failure) (HCC) Active Problems:   CAD- severe 3V CAD    Chronic respiratory failure with hypoxia (HCC)   Atrial fibrillation with rapid ventricular response (HCC)   Secondary hypercoagulable state ([place])   Acute renal failure superimposed on stage 3b chronic kidney disease (HCC)   Thrombocytopenia (HCC)   Pressure injury of skin   1. Acute on chronic systolic heart failure exacerbation/ AS sp TAVR.Echocardiogram from 06/21 with LV EF 40 to 45%, mild decreased systolic function on the left and right ventricle.Bilateral severe atrial dilatation. Holding after load reduction or RAAS inhibition due to risk of hypotension.  Patient continue to respond well to aggressive diuresis, her documented urine outputover last 24 His4,157ml,her cr is 1,81 and K at 3,4. Systolic blood pressure this am is 114 mmHg.  Not yet back to baseline  Continue with furosemide drip, metoprolol and digoxin. Follow with digoxin levels per pharmacy protocol.    2. Atrial fibrillation with rapid ventricular rate.Tolerating well po amiodarone and antithrombotic therapy with apixaban.  Continue to encourage mobility.   3. AKI on CKD stage 3b to 4/ hypokalemia/ metabolic alkalosis.serum cr this am down to 1,8, with adequate urine output, K is 3,4 and bicarbonate at 39., Mg 2.0  Continue with furosemide infusion for hypervolemia, she is not yet back to baseline.   4. Thrombocytopenia. Risk vs benefit will continue anticoagulation with apixaban.Platelet has been 69 range.  5. CAD/ dyslpidemia.troponin elevation due to heart failure exacerbation, no acs pattern. On Rosuvastatin.   6. Stage 2 sacrum  pressure ulcer/ present on admission.Continue with local skin care.  7. Reactive hyperglycemia. Marland Kitchen  Hgb A1c on 06/21 was 5,4.fasting insulin 133, continue to hold on insulin therapy for now.    Patient continue to be at high risk for worsening heart failure.   Status is: Inpatient  Remains inpatient appropriate because:IV treatments appropriate due to intensity of illness or inability to take PO   Dispo: The patient is from: Home              Anticipated d/c is to: Home              Anticipated d/c date is: 3 days              Patient currently is not medically stable to d/c.   DVT prophylaxis: apixaban   Code Status:    full  Family Communication:  No family at the bedside      Nutrition Status:           Skin Documentation: Pressure Injury 03/03/20 Sacrum Mid Stage 2 -  Partial thickness loss of dermis presenting as a shallow open injury with a red, pink wound bed without slough. (Active)  03/03/20 0226  Location: Sacrum  Location Orientation: Mid  Staging: Stage 2 -  Partial thickness loss of dermis presenting as a shallow open injury with a red, pink wound bed without slough.  Wound Description (Comments):   Present on Admission: Yes     Consultants:   Cardiology     Subjective: Patient with improvement in her symptoms this am less dyspnea and decreased lower extremity edema, but not yet back to baseline, no nausea or vomiting, continue to be very weak and deconditioned.   Objective: Vitals:   03/09/20 2318 03/10/20 0309 03/10/20 0347 03/10/20 0700  BP: 106/71 (!) 130/54  114/60  Pulse: 82 74  82  Resp: 17 15  16   Temp:  (!) 97.5 F (36.4 C)  97.8 F (36.6 C)  TempSrc:  Oral  Oral  SpO2: 96% 98%  97%  Weight:   92.7 kg   Height:        Intake/Output Summary (Last 24 hours) at 03/10/2020 0803 Last data filed at 03/10/2020 0745 Gross per 24 hour  Intake 1812.74 ml  Output 3810 ml  Net -1997.26 ml   Filed Weights   03/07/20 0500 03/09/20  0611 03/10/20 0347  Weight: 102.6 kg 98.4 kg 92.7 kg    Examination:   General: Not in pain or dyspnea., deconditioned  Neurology: Awake and alert, non focal  E ENT: mild pallor, no icterus, oral mucosa moist Cardiovascular: No JVD. S1-S2 present, rhythmic, no gallops, rubs, or murmurs. No lower extremity edema. Pulmonary: positive breath sounds bilaterally, no wheezing or rhonchi. Positive rales at bases. Gastrointestinal. Abdomen soft and non tender Skin. No rashes Musculoskeletal: no joint deformities     Data Reviewed: I have personally reviewed following labs and imaging studies  CBC: Recent Labs  Lab 03/04/20 0042 03/07/20 0228  WBC 7.1 5.7  HGB 13.2 12.3  HCT 41.9 38.8  MCV 101.5* 99.2  PLT 80* 69*   Basic Metabolic Panel: Recent Labs  Lab 03/05/20 1457 03/06/20 0124 03/06/20 1451 03/07/20 0228 03/07/20 1300 03/08/20 0128 03/09/20 0328 03/10/20 0124  NA   < >  --  134*  --  136 139 137 139  K   < >  --  3.0*  --  3.3* 2.9* 3.3* 3.4*  CL   < >  --  92*  --  91* 90* 87* 89*  CO2   < >  --  32  --  34* 37* 41* 39*  GLUCOSE   < >  --  210*  --  195* 154* 146* 133*  BUN   < >  --  57*  --  61* 65* 66* 69*  CREATININE   < >  --  2.20*  --  2.42* 2.39* 2.13* 1.81*  CALCIUM   < >  --  9.3  --  9.3 9.5 9.4 9.7  MG  --  2.0  --  2.0  --  2.1  --  2.0   < > = values in this interval not displayed.   GFR: Estimated Creatinine Clearance: 25.9 mL/min (A) (by C-G formula based on SCr of 1.81 mg/dL (H)). Liver Function Tests: No results for input(s): AST, ALT, ALKPHOS, BILITOT, PROT, ALBUMIN in the last 168 hours. No results for input(s): LIPASE, AMYLASE in the last 168 hours. No results for input(s): AMMONIA in the last 168 hours. Coagulation Profile: No results for input(s): INR, PROTIME in the last 168 hours. Cardiac Enzymes: No results for input(s): CKTOTAL, CKMB, CKMBINDEX, TROPONINI in the last 168 hours. BNP (last 3 results) No results for input(s):  PROBNP in the last 8760 hours. HbA1C: No results for input(s): HGBA1C in the last 72 hours. CBG: No results for input(s): GLUCAP in the last 168 hours. Lipid Profile: No results for input(s): CHOL, HDL, LDLCALC, TRIG, CHOLHDL, LDLDIRECT in the last 72 hours. Thyroid Function Tests: No results for input(s): TSH, T4TOTAL, FREET4, T3FREE, THYROIDAB in the last 72 hours. Anemia Panel: No results for input(s): VITAMINB12, FOLATE, FERRITIN, TIBC, IRON, RETICCTPCT in the last 72 hours.    Radiology Studies: I have reviewed all of the imaging during this hospital visit personally     Scheduled Meds: . amiodarone  400 mg Oral BID  . apixaban  2.5 mg Oral BID  . digoxin  0.125 mg Oral QODAY  . metoprolol tartrate  12.5 mg Oral BID  . potassium chloride  40 mEq Oral TID  . rosuvastatin  10 mg Oral Daily  . sodium chloride flush  3 mL Intravenous Q12H   Continuous Infusions: . sodium chloride    . furosemide (LASIX) infusion 10 mg/hr (03/08/20 1645)     LOS: 8 days        Shayna Eblen Gerome Apley, MD

## 2020-03-10 NOTE — Progress Notes (Signed)
Physical Therapy Treatment Patient Details Name: Carrie Monroe MRN: [id number] DOB: 1935/11/06 Today's Date: 03/10/2020    History of Present Illness 84 y.o. female presenting with weight gain, worsening of chronic BLE edema, and progressive dyspnea. Patient admitted with acute on chronic systolic CHF. Previous admission 11/2019 with new onset afib w/ RVR and acute on chronic CHF. PMH includes: 3-vessel CAD s/p DES PCI (2018), severe AS s/p TAVR (2018), chronic HFpEF, HTN, HLD, DMII, and CKD stage IIIb.     PT Comments    Pt seated in chair on arrival.  Upon standing presents with urinary and bowel incontinence.  Required trip to toilet to clean bottom.  Pt reports she was unaware of BM in recliner.  She continues to require min assistance to mobilize.  Plan for SNF remains appropriate.     Follow Up Recommendations  SNF;Supervision/Assistance - 24 hour     Equipment Recommendations  None recommended by PT    Recommendations for Other Services       Precautions / Restrictions Precautions Precautions: Fall Restrictions Weight Bearing Restrictions: No    Mobility  Bed Mobility               General bed mobility comments: Pt seated in recliner on arrival this session.  Transfers Overall transfer level: Needs assistance Equipment used: Rolling walker (2 wheeled) Transfers: Sit to/from Stand Sit to Stand: Min assist         General transfer comment: Cues for hand placement, forward weight shifting and pushing through B LEs.  Performed from recliner and commode.  Ambulation/Gait Ambulation/Gait assistance: Min guard Gait Distance (Feet): 5 Feet (+ 50 ft) Assistive device: Rolling walker (2 wheeled) Gait Pattern/deviations: Step-to pattern;Wide base of support;Trunk flexed Gait velocity: reduced   General Gait Details: Pt with poor posture and overly flexed over RW.  Cues for pacing and safety with device.  Continues to report dizziness ( likley related to  rapid weight loss from fluid removal).   Stairs             Wheelchair Mobility    Modified Rankin (Stroke Patients Only)       Balance Overall balance assessment: Needs assistance Sitting-balance support: No upper extremity supported;Feet supported Sitting balance-Leahy Scale: Fair       Standing balance-Leahy Scale: Poor Standing balance comment: reliant on UE support of RW                            Cognition Arousal/Alertness: Awake/alert Behavior During Therapy: WFL for tasks assessed/performed Overall Cognitive Status: Impaired/Different from baseline Area of Impairment: Problem solving;Memory                     Memory: Decreased short-term memory       Problem Solving: Requires verbal cues General Comments: Pt required constant VCs throughout, redirection due to STM deficits.      Exercises      General Comments        Pertinent Vitals/Pain Pain Assessment: 0-10 Faces Pain Scale: Hurts a little bit Pain Location: buttocks during pericare. Pain Descriptors / Indicators: Sore;Grimacing;Discomfort Pain Intervention(s): Monitored during session;Repositioned    Home Living                      Prior Function            PT Goals (current goals can now be found in the care plan section)  Acute Rehab PT Goals Patient Stated Goal: Patient would like to get back to driving Potential to Achieve Goals: Good Progress towards PT goals: Progressing toward goals    Frequency    Min 3X/week      PT Plan Current plan remains appropriate    Co-evaluation              AM-PAC PT "6 Clicks" Mobility   Outcome Measure  Help needed turning from your back to your side while in a flat bed without using bedrails?: A Little Help needed moving from lying on your back to sitting on the side of a flat bed without using bedrails?: A Lot Help needed moving to and from a bed to a chair (including a wheelchair)?: A  Little Help needed standing up from a chair using your arms (e.g., wheelchair or bedside chair)?: A Little Help needed to walk in hospital room?: A Little Help needed climbing 3-5 steps with a railing? : A Lot 6 Click Score: 16    End of Session Equipment Utilized During Treatment: Oxygen;Gait belt Activity Tolerance: Patient tolerated treatment well Patient left: in chair;with call bell/phone within reach;with chair alarm set Nurse Communication: Mobility status PT Visit Diagnosis: Unsteadiness on feet (R26.81);Muscle weakness (generalized) (M62.81)     Time: 1840-3754 PT Time Calculation (min) (ACUTE ONLY): 33 min  Charges:  $Gait Training: 8-22 mins $Therapeutic Activity: 8-22 mins                     Erasmo Leventhal , PTA Acute Rehabilitation Services Pager [phone] Office [phone]     Reya Aurich Eli Hose 03/10/2020, 5:21 PM

## 2020-03-10 NOTE — Progress Notes (Signed)
Mobility Specialist - Progress Note   03/10/20 1429  Mobility  Activity Ambulated in room  Level of Assistance Standby assist, set-up cues, supervision of patient - no hands on  Assistive Device Front wheel walker  Distance Ambulated (ft) 60 ft (Intervals: 30 ft x 2)  Mobility Response Tolerated fair  Mobility performed by Mobility specialist  $Mobility charge 1 Mobility    Pre-mobility: 80 HR, 95% SpO2 During mobility: 97 HR, 93% SpO2 Post-mobility: 85 HR, 95% SpO2  Pt ambulated on 2 L of O2. She says she feels like could ambulate more if her persistent dizziness would resolve. Pt back in chair w/ chair alarm on after walk.  Pricilla Handler Mobility Specialist Mobility Specialist Phone: [phone]

## 2020-03-10 NOTE — Progress Notes (Addendum)
Progress Note  Patient Name: Carrie Monroe Date of Encounter: 03/10/2020  Primary Cardiologist: Sanda Klein, MD   Subjective   Carrie Monroe is an 84 year old female with a past medical history significant for severe AS (s/p TAVR 01/2017 - 26 mm Med[employer]onic CorValve Evolut Pro), CAD (multi-vessel PCI in 2018),HFmrEF (EF 40-45%, Echo 6/21), persistent atrial fibrillation on Eliquis, breast CA s/p lumpectomy & XRT, T2DM, gout, HTN, and hyperlipidemia who presented 03/01/20 with decompensated HF and A fib RVR.  Through course has diuresed well with drip and occasional metolazone  Long hx dizziness, does not drive. Has scales, says can weigh. Breathing better but not at baseline. No chest pain. Completely unaware she was gaining fluid.   Inpatient Medications    Scheduled Meds:  amiodarone  400 mg Oral BID   apixaban  2.5 mg Oral BID   digoxin  0.125 mg Oral QODAY   metoprolol tartrate  12.5 mg Oral BID   potassium chloride  40 mEq Oral TID   rosuvastatin  10 mg Oral Daily   sodium chloride flush  3 mL Intravenous Q12H   Continuous Infusions:  sodium chloride     furosemide (LASIX) infusion 10 mg/hr (03/08/20 1645)   PRN Meds: sodium chloride, acetaminophen, docusate sodium, ondansetron (ZOFRAN) IV, sodium chloride flush   Vital Signs    Vitals:   03/09/20 2318 03/10/20 0309 03/10/20 0347 03/10/20 0700  BP: 106/71 (!) 130/54  114/60  Pulse: 82 74  82  Resp: 17 15  16   Temp:  (!) 97.5 F (36.4 C)  97.8 F (36.6 C)  TempSrc:  Oral  Oral  SpO2: 96% 98%  97%  Weight:   92.7 kg   Height:        Intake/Output Summary (Last 24 hours) at 03/10/2020 0914 Last data filed at 03/10/2020 0745 Gross per 24 hour  Intake 1212.74 ml  Output 3810 ml  Net -2597.26 ml   Filed Weights   03/07/20 0500 03/09/20 0611 03/10/20 0347  Weight: 102.6 kg 98.4 kg 92.7 kg    Telemetry    Afib, rate ok - Personally Reviewed  ECG    No new- Personally Reviewed  Physical  Exam   GEN: No acute distress.   Neck: No JVD seen but HJR + Cardiac: Irreg R&R, soft murmur RUSB, no rubs, or gallops.  Respiratory: diminished to auscultation bilaterally with rales in the bases. GI: Soft, nontender, non-distended  MS: 1+ edema; No deformity. Neuro:  Nonfocal  Psych: Normal affect   Labs    Chemistry Recent Labs  Lab 03/08/20 0128 03/09/20 0328 03/10/20 0124  NA 139 137 139  K 2.9* 3.3* 3.4*  CL 90* 87* 89*  CO2 37* 41* 39*  GLUCOSE 154* 146* 133*  BUN 65* 66* 69*  CREATININE 2.39* 2.13* 1.81*  CALCIUM 9.5 9.4 9.7  GFRNONAA 18* 21* 26*  ANIONGAP 12 9 11      Hematology Recent Labs  Lab 03/04/20 0042 03/07/20 0228  WBC 7.1 5.7  RBC 4.13 3.91  HGB 13.2 12.3  HCT 41.9 38.8  MCV 101.5* 99.2  MCH 32.0 31.5  MCHC 31.5 31.7  RDW 17.5* 17.4*  PLT 80* 69*   BNP BNP (last 3 results) Recent Labs    04/27/19 2338 11/21/19 2033 03/02/20 1936  BNP 350.0* 278.1* 529.1*    Radiology    No results found.  Cardiac Studies   No new studies  Patient Profile     84 y.o. female  AS, CAD, HFmrEF, and A fib, admitted 10/06 with decompensated HF.  Assessment & Plan    Persistent Atrial Fibrillation:  - rate improved w/ dig 0.125 mg qod (adjusted for decreased renal function) and metop 12.5 mg bid - need dig level in 2-3 weeks - amio started 400 mg bid till 10/18, then 200 mg qd - CHA2DS2-VASc = 5, on Eliquis 2.5 mg bid pta, can do DCCV once improved   Acute on Chronic Systolic Heart Failure (HFmrEF):  - wt down 38 lbs since admit - I/O not quite complete, net -18.7 L since admit - still w/ volume overload - EF mildly decreased from prior; will re-evaluate in SR - supp K+ - will need education as outpt and help   Acute on CKD 3b:  - improving gradually - follow   CAD and HLD:  - hx DES LAD/RCA 2018 - no ischemic sx - on BB and statin    AS s/p TAVR: - ok by recent echo  Hypokalemia - currently on Kdur 40 meq tid - K+ still low,  will give one extra dose today   For questions or updates, please contact [facility] Please consult [email or web address] for contact info under Cardiology/STEMI.      Signed, Rosaria Ferries, PA-C  03/10/2020, 9:14 AM    Personally seen and examined. Agree with APP Provider above with the following comments: 84 yo F presenting in florid HF improving with diuresis - we are getting close to euvolemic; could be a potential DCCV 10/18 or 10/19 - we have underestimated patient's output with purewick and I/Os issues.  Werner Lean, MD

## 2020-03-11 DIAGNOSIS — J9611 Chronic respiratory failure with hypoxia: Secondary | ICD-10-CM | POA: Diagnosis not present

## 2020-03-11 DIAGNOSIS — I4891 Unspecified atrial fibrillation: Secondary | ICD-10-CM | POA: Diagnosis not present

## 2020-03-11 DIAGNOSIS — N179 Acute kidney failure, unspecified: Secondary | ICD-10-CM | POA: Diagnosis not present

## 2020-03-11 DIAGNOSIS — I25110 Atherosclerotic heart disease of native coronary artery with unstable angina pectoris: Secondary | ICD-10-CM | POA: Diagnosis not present

## 2020-03-11 DIAGNOSIS — I5023 Acute on chronic systolic (congestive) heart failure: Secondary | ICD-10-CM | POA: Diagnosis not present

## 2020-03-11 DIAGNOSIS — L8942 Pressure ulcer of contiguous site of back, buttock and hip, stage 2: Secondary | ICD-10-CM

## 2020-03-11 LAB — BASIC METABOLIC PANEL
Anion gap: 9 (ref 5–15)
BUN: 66 mg/dL — ABNORMAL HIGH (ref 8–23)
CO2: 36 mmol/L — ABNORMAL HIGH (ref 22–32)
Calcium: 9.3 mg/dL (ref 8.9–10.3)
Chloride: 90 mmol/L — ABNORMAL LOW (ref 98–111)
Creatinine, Ser: 1.71 mg/dL — ABNORMAL HIGH (ref 0.44–1.00)
GFR, Estimated: 27 mL/min — ABNORMAL LOW (ref >60)
Glucose, Bld: 144 mg/dL — ABNORMAL HIGH (ref 70–99)
Potassium: 4.6 mmol/L (ref 3.5–5.1)
Sodium: 135 mmol/L (ref 135–145)

## 2020-03-11 LAB — DIGOXIN LEVEL: Digoxin Level: 0.6 ng/mL — ABNORMAL LOW (ref 1.0–2.0)

## 2020-03-11 MED ORDER — POTASSIUM CHLORIDE CRYS ER 20 MEQ PO TBCR
40.0000 meq | EXTENDED_RELEASE_TABLET | Freq: Two times a day (BID) | ORAL | Status: DC
  Administered 2020-03-11 – 2020-03-14 (×7): 40 meq via ORAL
  Filled 2020-03-11 (×7): qty 2

## 2020-03-11 NOTE — Progress Notes (Addendum)
Progress Note  Patient Name: Carrie Monroe Date of Encounter: 03/11/2020  [facility] Cardiologist: Sanda Klein, MD   Subjective   Patient put out -2.1L overnight. Says she is overall feeling the same. On 2-3 L O2 as needed. Weight possible not accurate today. Creatinine improving with diuresis.   Inpatient Medications    Scheduled Meds: . amiodarone  400 mg Oral BID  . apixaban  2.5 mg Oral BID  . digoxin  0.125 mg Oral QODAY  . metoprolol tartrate  12.5 mg Oral BID  . polyethylene glycol  17 g Oral BID  . potassium chloride  40 mEq Oral BID  . rosuvastatin  10 mg Oral Daily  . sodium chloride flush  3 mL Intravenous Q12H   Continuous Infusions: . sodium chloride    . furosemide (LASIX) infusion 10 mg/hr (03/10/20 1838)   PRN Meds: sodium chloride, acetaminophen, docusate sodium, ondansetron (ZOFRAN) IV, sodium chloride flush   Vital Signs    Vitals:   03/10/20 2049 03/10/20 2344 03/11/20 0421 03/11/20 0907  BP: 95/68 (!) 100/55 (!) 119/57 (!) 104/56  Pulse: 96 82 88 71  Resp: 19 18 16 18   Temp: 98 F (36.7 C) 98 F (36.7 C) 97.9 F (36.6 C) 97.9 F (36.6 C)  TempSrc: Oral Oral Oral Oral  SpO2: 96% 98% 96% 96%  Weight:   95.7 kg   Height:        Intake/Output Summary (Last 24 hours) at 03/11/2020 0941 Last data filed at 03/11/2020 0423 Gross per 24 hour  Intake 1033.56 ml  Output 1560 ml  Net -526.44 ml   Last 3 Weights 03/11/2020 03/10/2020 03/09/2020  Weight (lbs) 210 lb 15.7 oz 204 lb 4.8 oz 216 lb 14.9 oz  Weight (kg) 95.7 kg 92.67 kg 98.4 kg      Telemetry    Afib, Hr 80-90s - Personally Reviewed  ECG    No  new - Personally Reviewed  Physical Exam   GEN: No acute distress.   Neck: mild JVD Cardiac: RRR, no murmurs, rubs, or gallops.  Respiratory: diffusely diminished GI: Soft, nontender, non-distended  MS: 1+ b/l edema; No deformity. Neuro:  Nonfocal  Psych: Normal affect   Labs    High Sensitivity Troponin:     Recent Labs  Lab 03/02/20 1955 03/03/20 0327  TROPONINIHS 54* 52*      Chemistry Recent Labs  Lab 03/09/20 0328 03/10/20 0124 03/11/20 0146  NA 137 139 135  K 3.3* 3.4* 4.6  CL 87* 89* 90*  CO2 41* 39* 36*  GLUCOSE 146* 133* 144*  BUN 66* 69* 66*  CREATININE 2.13* 1.81* 1.71*  CALCIUM 9.4 9.7 9.3  GFRNONAA 21* 26* 27*  ANIONGAP 9 11 9      Hematology Recent Labs  Lab 03/07/20 0228  WBC 5.7  RBC 3.91  HGB 12.3  HCT 38.8  MCV 99.2  MCH 31.5  MCHC 31.7  RDW 17.4*  PLT 69*    BNPNo results for input(s): BNP, PROBNP in the last 168 hours.   DDimer No results for input(s): DDIMER in the last 168 hours.   Radiology    No results found.  Cardiac Studies   Echo 11/22/19  1. Left ventricular ejection fraction, by estimation, is 40 to 45%. The  left ventricle has mildly decreased function. The left ventricle  demonstrates global hypokinesis. There is mild concentric left ventricular  hypertrophy. Left ventricular diastolic  function could not be evaluated.  2. Right ventricular systolic function  is mildly reduced. The right  ventricular size is mildly enlarged. There is moderately elevated  pulmonary artery systolic pressure. The estimated right ventricular  systolic pressure is 38.3 mmHg.  3. Left atrial size was severely dilated.  4. Right atrial size was severely dilated.  5. The mitral valve is normal in structure. Mild to moderate mitral valve  regurgitation.  6. The aortic valve has been repaired/replaced. Aortic valve  regurgitation is not visualized. There is a 26 mm Medtronic  CoreValve-EvolutR prosthetic (TAVR) valve present in the aortic position.  Procedure Date: September 2018. Echo findings are  consistent with normal structure and function of the aortic valve  prosthesis.  7. The inferior vena cava is dilated in size with <50% respiratory  variability, suggesting right atrial pressure of 15 mmHg.   Long term monitor 02/02/20 Abnormal  event monitor due to the presence of persistent atrial fibrillation with poor ventricular rate control. The minimum heart rate is 65 bpm, the maximum heart rate is 165 bpm, the average ventricular rate is 96 bpm.  There were no significant pauses or periods of bradycardia.  Very rare PVCs are seen.   Patient Profile     84 y.o. 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 Patient has diuresed well with occasional metolazone.   Assessment & Plan    Persistent Atrial fib - started on dig and rates improved, now in the 80-90s. Will need dig level in 2-3 weeks - continue metoprolol 12.5mg  BID - also started on amiodarone 400mg  BID until 10/18, then 200mg  daily - CHADSVASC = 5 - continue Eliquis 2.5mg  daily - plan for DCCV this admission  Acute on chronic CHF - on lasix drip - Urine output overnight -2.1L, net -10.2L - weight 242>210lbs - Still has some extra fluid on exam, but improving. Continue with diuresis.   AKI on CKD stage 3 - creatinine 1.81>1.71, improving with diuresis - Baseline around 1.4  CAD s/p DES LAD and RCA in 2018 - no ischemic symptoms - No aspirin with DOAC - continue statin and BB  HLD - continue rosuvastatin 10 mg daily  AS s/p TAVR - stable be recent echo  Hypokalemia - supplement as needed - goal >4  For questions or updates, please contact [facility] Please consult [email or web address] for contact info under      Signed, Cadence Ninfa Meeker, PA-C  03/11/2020, 9:41 AM    Personally seen and examined. Agree with APP Provider above with the following comments:  84 year old female with a past medical history significant for severe AS (s/p TAVR 01/2017 -26 mm Medtronic CorValve Evolut Pro), CAD  (multi-vessel PCI in 2018), HFmrEF (EF 40-45%, Echo 6/21),persistentatrial fibrillation on Eliquis,breast HTN,andhyperlipidemia who presented 03/01/20 with decompensated HF and A fib RVR. Through course has had large volume diuresis.  Started digoxin with reasonable improvement.  - Approaching euvolemia; perhaps 1-2 days more of Lasix Drip; creatinine improving - possible DCCV early next week - would benefit from torsemide or bumex as an outpatient  Werner Lean, MD

## 2020-03-11 NOTE — Progress Notes (Signed)
Occupational Therapy Treatment Patient Details Name: Carrie Monroe MRN: [id number] DOB: 02-08-1936 Today's Date: 03/11/2020    History of present illness 84 y.o. female presenting with weight gain, worsening of chronic BLE edema, and progressive dyspnea. Patient admitted with acute on chronic systolic CHF. Previous admission 11/2019 with new onset afib w/ RVR and acute on chronic CHF. PMH includes: 3-vessel CAD s/p DES PCI (2018), severe AS s/p TAVR (2018), chronic HFpEF, HTN, HLD, DMII, and CKD stage IIIb.    OT comments  Patient presents with continued dizziness, decreased activity tolerance, generalized weakness and poor standing balance.  Patient is hoping to transition home with assist from her daughter for a week, but transition to SNF for continued rehab prior to home is recommended.  Patient needs 24 hour physical assist.  OT will continue to follow in the acute setting.  Hopefully her fluid pill will be changed next week, and her dizziness will subside.  If so, her functional status may improve enough to make a transition home with assist a possibility.  VSS throughout session.    Follow Up Recommendations  Supervision/Assistance - 24 hour;SNF    Equipment Recommendations    Pending progress: if home, wheelchair, wheelchair cushion, shower chair.  HH OT and 24 hour assist.     Recommendations for Other Services      Precautions / Restrictions Precautions Precautions: Fall Restrictions Weight Bearing Restrictions: No       Mobility Bed Mobility Overal bed mobility: Needs Assistance         Sit to supine: Mod assist      Transfers   Equipment used: Rolling walker (2 wheeled) Transfers: Sit to/from Stand Sit to Stand: Min guard Stand pivot transfers: Min guard                                                       ADL either performed or assessed with clinical judgement   ADL       Grooming: Wash/dry hands;Wash/dry face;Oral  care;Brushing hair;Set up;Sitting               Lower Body Dressing: Maximal assistance;Sit to/from stand   Toilet Transfer: Minimal assistance;RW           Functional mobility during ADLs: Minimal assistance;Rolling walker       Vision Baseline Vision/History: Wears glasses Wears Glasses: Reading only Patient Visual Report: No change from baseline Vision Assessment?: No apparent visual deficits                         Pertinent Vitals/ Pain       Faces Pain Scale: Hurts a little bit Pain Location: bottom Pain Descriptors / Indicators: Discomfort Pain Intervention(s): Monitored during session                                                          Frequency  Min 2X/week        Progress Toward Goals  OT Goals(current goals can now be found in the care plan section)  Progress towards OT goals: Progressing toward goals  Acute Rehab OT Goals Patient Stated  Goal: Patient is anxious to get back home. OT Goal Formulation: With patient Time For Goal Achievement: 03/20/20 Potential to Achieve Goals: Good  Plan Discharge plan remains appropriate;Frequency needs to be updated    Co-evaluation                 AM-PAC OT "6 Clicks" Daily Activity     Outcome Measure   Help from another person eating meals?: None Help from another person taking care of personal grooming?: A Little Help from another person toileting, which includes using toliet, bedpan, or urinal?: A Little Help from another person bathing (including washing, rinsing, drying)?: A Lot Help from another person to put on and taking off regular upper body clothing?: A Little Help from another person to put on and taking off regular lower body clothing?: A Lot 6 Click Score: 17    End of Session Equipment Utilized During Treatment: Rolling walker  OT Visit Diagnosis: Unsteadiness on feet (R26.81);Muscle weakness (generalized) (M62.81)   Activity Tolerance  Other (comment) (limited by dizziness)   Patient Left with call bell/phone within reach;in bed;with bed alarm set   Nurse Communication Other (comment) (cleared treatment)        Time: 3545-6256 OT Time Calculation (min): 13 min  Charges: OT General Charges $OT Visit: 1 Visit OT Treatments $Self Care/Home Management : 8-22 mins  03/11/2020  Rich, OTR/L  Acute Rehabilitation Services  Office:  [phone]    Carrie Monroe 03/11/2020, 2:58 PM

## 2020-03-11 NOTE — Care Management Important Message (Signed)
Important Message  Patient Details  Name: Carrie Monroe MRN: [id number] Date of Birth: 01/17/1936   Medicare Important Message Given:  Yes     Shelda Altes 03/11/2020, 11:28 AM

## 2020-03-11 NOTE — Progress Notes (Addendum)
PROGRESS NOTE    Carrie Monroe  XBJ:[id number] DOB: 1935-10-31 DOA: 03/02/2020 PCP: Marton Redwood, MD    Brief Narrative:  Carrie Monroe. Gutzmer was admitted with the working diagnosis of acute on chronic systolic heart failure exacerbation, complicated with atrial fibrillation with rapid ventricular response  84 yo female with past medical history ofatrial fibrillation, systolic heart failure, severe aortic stenosis status post TAVR,andchronic kidney disease stage IIIb who presented with dyspnea and weight gain. Patient reported progressive, and worsening lower extremity edema, orthopnea, dyspnea on exertion and 11 pound weight gain. Her symptoms were refractive to increased dose of furosemide as an outpatient. Due to worsening symptoms she called EMS, she was found in atrial fibrillation with rapid ventricular response, she received 10 mg of IV diltiazem on route before arriving to the ED. On her initial physical examination she was tachycardic 131 bpm, tachypneic 26 breaths/min and hypoxic, oxygen saturation 80% on 2 L per nasal cannula, blood pressure 116/63, positive JVD, lungs with no wheezing, heart S1-S2 present, tachycardic, irregularly irregular, abdomen soft, Positive3+ bilateral lower extremity pitting edema. Sodium 140, potassium 4.2, chloride 102, bicarb 27, glucose 153, BUN 63, creatinine 2.79, magnesium 2.5, BNP 529, white count 8.1, hemoglobin 13.7, hematocrit 43.9, platelets 92. SARS COVID-19 negative. Urinalysis negative for infection,specific gravity 1.009. Chest radiograph with bilateral interstitial infiltrates at the lower zones, positive small bilateral pleural effusions. EKG 136 bpm, left axis deviation, left bundle branch block, measured QTC 400 ms, atrial fibrillation rhythm, noisy baseline, poor R wave progression, no significant ST segment changes, V5-V6 T wave inversion.  Patient was placed on diltiazem infusion and aggressive diuresis with furosemide.    Diltiazem was changed toamiodarone per cariology recommendations with good toleration.  She has severehypervolemia, placed onfurosemide drip to target further negative fluid balance.(metolazone x2on 10/08-10/11)   Assessment & Plan:   Principal Problem:   Acute on chronic systolic CHF (congestive heart failure) (HCC) Active Problems:   CAD- severe 3V CAD    Chronic respiratory failure with hypoxia (HCC)   Atrial fibrillation with rapid ventricular response (HCC)   Secondary hypercoagulable state ([place])   AKI (acute kidney injury) ([place])   Thrombocytopenia (HCC)   Pressure injury of skin   1. Acute on chronic systolic heart failure exacerbation/ Aortic Stenosis sp TAVR.Echocardiogram from 06/21 with LV EF 40 to 45%, mild decreased systolic function on the left and right ventricle.Bilateral severe atrial dilatation. Holding afterload reduction or RAAS inhibition due to risk of hypotension.  Her volume status continue to improve. Urine output over last 24 His2,144ml, accumulated negative fluid balance since admission -19,265 ml. Today's weight is 95.7 Kg on admission 109.9 Kg, for a 14.2 Kg weight loss.   Tolerating well aggressive diuresis with furosemide drip, continue heart failure management with metoprolol and digoxin.Dig level today 0,6.   2. Atrial fibrillation with rapid ventricular rate.Heart rate has remained stable, around 80 with atrial fibrillation rhythm, personally reviewed telemetry.   Continue with  po amiodarone/ metoprolol/ digoxin for rate and rhythm control and antithrombotic therapy with apixaban.  3. AKI on CKD stage 3b to 4/ hypokalemia/ metabolic alkalosis.Renal function with serum cr 1,71, trending down, K is 4,6 and bicarbonate is 36. Mg 2,0.   Clinically with improved hypervolemia but not yet back to baseline, continue with IV loop diuretic therapy. Follow up on renal function in am, will decrease K to 40 meq bid from tid to prevent  hyperkalemia.   4. Thrombocytopenia. Risk vs benefit will continue anticoagulation with apixaban.Platelet has been  69 range.  5. CAD/ dyslpidemia.troponin elevation due to heart failure exacerbation, no acs pattern. No chest pain continue statin therapy with Rosuvastatin.  6. Stage 2 sacrum pressure ulcer/ present on admission.Local skin care.  7. Reactive hyperglycemia. . Hgb A1c on 06/21 was 5,4.fasting glucose this am144. Holding insulin therapy with good toleration. Patient is tolerating po well.    Patient continue to be at high risk for worsening atrial fibrillation and heart failure.   Status is: Inpatient  Remains inpatient appropriate because:IV treatments appropriate due to intensity of illness or inability to take PO   Dispo: The patient is from: Home              Anticipated d/c is to: Home              Anticipated d/c date is: 3 days              Patient currently is not medically stable to d/c. PT/OT recommendations for SNF but patient prefers home with home health, declined SNF. Plan for dc when achieved euvolemia.     DVT prophylaxis: apixaban  Code Status:   full  Family Communication:  No family at the bedside, I have been updating her son when he visits Carrie Monroe, Carrie Monroe.       Skin Documentation: Pressure Injury 03/03/20 Sacrum Mid Stage 2 -  Partial thickness loss of dermis presenting as a shallow open injury with a red, pink wound bed without slough. (Active)  03/03/20 0226  Location: Sacrum  Location Orientation: Mid  Staging: Stage 2 -  Partial thickness loss of dermis presenting as a shallow open injury with a red, pink wound bed without slough.  Wound Description (Comments):   Present on Admission: Yes     Consultants:   Cardiology     Subjective: Patient is feeling very weak and deconditioned, no nausea or vomiting, her lower extremity continue to improve but is not yet back to baseline, dyspnea has been improving, no chest pain or  palpitations.   Objective: Vitals:   03/10/20 1800 03/10/20 2049 03/10/20 2344 03/11/20 0421  BP: 116/70 95/68 (!) 100/55 (!) 119/57  Pulse: 83 96 82 88  Resp: 17 19 18 16   Temp: 97.9 F (36.6 C) 98 F (36.7 C) 98 F (36.7 C) 97.9 F (36.6 C)  TempSrc: Oral Oral Oral Oral  SpO2: 97% 96% 98% 96%  Weight:    95.7 kg  Height:        Intake/Output Summary (Last 24 hours) at 03/11/2020 0814 Last data filed at 03/11/2020 0423 Gross per 24 hour  Intake 1033.56 ml  Output 1560 ml  Net -526.44 ml   Filed Weights   03/09/20 0611 03/10/20 0347 03/11/20 0421  Weight: 98.4 kg 92.7 kg 95.7 kg    Examination:   General: Not in pain or dyspnea but deconditioned  Neurology: Awake and alert, non focal  E ENT: mild pallor, no icterus, oral mucosa moist Cardiovascular: No JVD. S1-S2 present, irregularly irregular no gallops, rubs. Positive systolic murmur 3/6 at the base. ++ pitting bilateral lower extremity edema. Pulmonary: positive breath sounds bilaterally,no wheezing, scattered rales, but not rhonchi on anterior auscultation.  Gastrointestinal. Abdomen protuberant, soft and non tender Skin. No rashes Musculoskeletal: no joint deformities     Data Reviewed: I have personally reviewed following labs and imaging studies  CBC: Recent Labs  Lab 03/07/20 0228  WBC 5.7  HGB 12.3  HCT 38.8  MCV 99.2  PLT 69*   Basic  Metabolic Panel: Recent Labs  Lab 03/06/20 0124 03/06/20 1451 03/07/20 0228 03/07/20 1300 03/08/20 0128 03/09/20 0328 03/10/20 0124 03/11/20 0146  NA  --    < >  --  136 139 137 139 135  K  --    < >  --  3.3* 2.9* 3.3* 3.4* 4.6  CL  --    < >  --  91* 90* 87* 89* 90*  CO2  --    < >  --  34* 37* 41* 39* 36*  GLUCOSE  --    < >  --  195* 154* 146* 133* 144*  BUN  --    < >  --  61* 65* 66* 69* 66*  CREATININE  --    < >  --  2.42* 2.39* 2.13* 1.81* 1.71*  CALCIUM  --    < >  --  9.3 9.5 9.4 9.7 9.3  MG 2.0  --  2.0  --  2.1  --  2.0  --    < > =  values in this interval not displayed.   GFR: Estimated Creatinine Clearance: 27.9 mL/min (A) (by C-G formula based on SCr of 1.71 mg/dL (H)). Liver Function Tests: No results for input(s): AST, ALT, ALKPHOS, BILITOT, PROT, ALBUMIN in the last 168 hours. No results for input(s): LIPASE, AMYLASE in the last 168 hours. No results for input(s): AMMONIA in the last 168 hours. Coagulation Profile: No results for input(s): INR, PROTIME in the last 168 hours. Cardiac Enzymes: No results for input(s): CKTOTAL, CKMB, CKMBINDEX, TROPONINI in the last 168 hours. BNP (last 3 results) No results for input(s): PROBNP in the last 8760 hours. HbA1C: No results for input(s): HGBA1C in the last 72 hours. CBG: No results for input(s): GLUCAP in the last 168 hours. Lipid Profile: No results for input(s): CHOL, HDL, LDLCALC, TRIG, CHOLHDL, LDLDIRECT in the last 72 hours. Thyroid Function Tests: No results for input(s): TSH, T4TOTAL, FREET4, T3FREE, THYROIDAB in the last 72 hours. Anemia Panel: No results for input(s): VITAMINB12, FOLATE, FERRITIN, TIBC, IRON, RETICCTPCT in the last 72 hours.    Radiology Studies: I have reviewed all of the imaging during this hospital visit personally     Scheduled Meds: . amiodarone  400 mg Oral BID  . apixaban  2.5 mg Oral BID  . digoxin  0.125 mg Oral QODAY  . metoprolol tartrate  12.5 mg Oral BID  . polyethylene glycol  17 g Oral BID  . potassium chloride  40 mEq Oral TID  . rosuvastatin  10 mg Oral Daily  . sodium chloride flush  3 mL Intravenous Q12H   Continuous Infusions: . sodium chloride    . furosemide (LASIX) infusion 10 mg/hr (03/10/20 1838)     LOS: 9 days        Jermisha Hoffart Gerome Apley, MD

## 2020-03-11 NOTE — Plan of Care (Signed)
  Problem: Health Behavior/Discharge Planning: Goal: Ability to manage health-related needs will improve Outcome: Progressing   Problem: Nutrition: Goal: Adequate nutrition will be maintained Outcome: Progressing   

## 2020-03-11 NOTE — Progress Notes (Signed)
Physical Therapy Treatment Patient Details Name: Carrie Monroe MRN: [id number] DOB: [birth year] Today's Date: 03/11/2020    History of Present Illness 84 y.o. female presenting with weight gain, worsening of chronic BLE edema, and progressive dyspnea. Patient admitted with acute on chronic systolic CHF. Previous admission 11/2019 with new onset afib w/ RVR and acute on chronic CHF. PMH includes: 3-vessel CAD s/p DES PCI (2018), severe AS s/p TAVR (2018), chronic HFpEF, HTN, HLD, DMII, and CKD stage IIIb.     PT Comments    Pt supine in bed on arrival.  Required max cues for encouragement to participate in PT session.  Pt able to increase gt this session.  She is requiring min to min guard assistance to mobilize.  Continue to complain of dizziness during session but denies symptoms worsening with mobility.  Will continue to recommend snf placement at this time.     Follow Up Recommendations  SNF;Supervision/Assistance - 24 hour     Equipment Recommendations  None recommended by PT    Recommendations for Other Services       Precautions / Restrictions Precautions Precautions: Fall Restrictions Weight Bearing Restrictions: No    Mobility  Bed Mobility Overal bed mobility: Needs Assistance Bed Mobility: Supine to Sit;Sit to Supine     Supine to sit: Min assist Sit to supine: Mod assist   General bed mobility comments: Pt supine in bed on arrival, min assistance to elevate trunk into a seated position.  Transfers Overall transfer level: Needs assistance Equipment used: Rolling walker (2 wheeled) Transfers: Sit to/from Stand Sit to Stand: Supervision Stand pivot transfers: Supervision       General transfer comment: Cues for hand placement to and from seated surface.  Ambulation/Gait Ambulation/Gait assistance: Min guard Gait Distance (Feet): 70 Feet Assistive device: Rolling walker (2 wheeled) Gait Pattern/deviations: Step-to pattern;Wide base of support;Trunk  flexed Gait velocity: reduced   General Gait Details: Cues for upper trunk control,  Pt required cues for safety with RW.  Continues to complain of dizziness.  Increased WOB noted but vitals stable.   Stairs             Wheelchair Mobility    Modified Rankin (Stroke Patients Only)       Balance Overall balance assessment: Needs assistance Sitting-balance support: No upper extremity supported;Feet supported Sitting balance-Leahy Scale: Good       Standing balance-Leahy Scale: Fair Standing balance comment: reliant on UE support of RW                            Cognition Arousal/Alertness: Awake/alert Behavior During Therapy: WFL for tasks assessed/performed Overall Cognitive Status: Within Functional Limits for tasks assessed                                        Exercises      General Comments        Pertinent Vitals/Pain Pain Assessment: No/denies pain Faces Pain Scale: Hurts a little bit Pain Location: bottom Pain Descriptors / Indicators: Discomfort Pain Intervention(s): Monitored during session    Home Living                      Prior Function            PT Goals (current goals can now be found in the care plan section)  Acute Rehab PT Goals Patient Stated Goal: Patient is anxious to get back home. Potential to Achieve Goals: Good Progress towards PT goals: Progressing toward goals    Frequency    Min 3X/week      PT Plan Current plan remains appropriate    Co-evaluation              AM-PAC PT "6 Clicks" Mobility   Outcome Measure  Help needed turning from your back to your side while in a flat bed without using bedrails?: A Little Help needed moving from lying on your back to sitting on the side of a flat bed without using bedrails?: A Little Help needed moving to and from a bed to a chair (including a wheelchair)?: None Help needed standing up from a chair using your arms (e.g.,  wheelchair or bedside chair)?: None Help needed to walk in hospital room?: A Little Help needed climbing 3-5 steps with a railing? : A Little 6 Click Score: 20    End of Session Equipment Utilized During Treatment: Gait belt;Oxygen Activity Tolerance: Patient tolerated treatment well Patient left: in chair;with call bell/phone within reach;with chair alarm set Nurse Communication: Mobility status PT Visit Diagnosis: Unsteadiness on feet (R26.81);Muscle weakness (generalized) (M62.81)     Time: 4665-9935 PT Time Calculation (min) (ACUTE ONLY): 27 min  Charges:  $Gait Training: 8-22 mins $Therapeutic Activity: 8-22 mins                     Erasmo Leventhal , PTA Acute Rehabilitation Services Pager [phone] Office [phone]     Dontreal Miera Eli Hose 03/11/2020, 5:20 PM

## 2020-03-11 NOTE — Progress Notes (Signed)
Patient assisted to chair. Chair alarm in placed. Patient call bell within reach will monitor patient. Romi Rathel, Bettina Gavia rN

## 2020-03-12 DIAGNOSIS — I4891 Unspecified atrial fibrillation: Secondary | ICD-10-CM | POA: Diagnosis not present

## 2020-03-12 DIAGNOSIS — I5023 Acute on chronic systolic (congestive) heart failure: Secondary | ICD-10-CM | POA: Diagnosis not present

## 2020-03-12 DIAGNOSIS — D696 Thrombocytopenia, unspecified: Secondary | ICD-10-CM | POA: Diagnosis not present

## 2020-03-12 LAB — BASIC METABOLIC PANEL
Anion gap: 11 (ref 5–15)
BUN: 67 mg/dL — ABNORMAL HIGH (ref 8–23)
CO2: 33 mmol/L — ABNORMAL HIGH (ref 22–32)
Calcium: 9.3 mg/dL (ref 8.9–10.3)
Chloride: 93 mmol/L — ABNORMAL LOW (ref 98–111)
Creatinine, Ser: 2.14 mg/dL — ABNORMAL HIGH (ref 0.44–1.00)
GFR, Estimated: 21 mL/min — ABNORMAL LOW (ref >60)
Glucose, Bld: 133 mg/dL — ABNORMAL HIGH (ref 70–99)
Potassium: 4.1 mmol/L (ref 3.5–5.1)
Sodium: 137 mmol/L (ref 135–145)

## 2020-03-12 LAB — MAGNESIUM: Magnesium: 1.9 mg/dL (ref 1.7–2.4)

## 2020-03-12 NOTE — Plan of Care (Signed)
  Problem: Health Behavior/Discharge Planning: Goal: Ability to manage health-related needs will improve Outcome: Progressing   

## 2020-03-12 NOTE — Plan of Care (Signed)
Progressing, will continue to monitor.  

## 2020-03-12 NOTE — Progress Notes (Signed)
Progress Note  Patient Name: Carrie Monroe Date of Encounter: 03/12/2020  Primary Cardiologist: Sanda Klein, MD   Subjective   Seen ambulating in the hall today, minimal distance. Dizzy with ambulation this is chronic over several months. Feels her lower extremity swelling is greatly improved.  Inpatient Medications    Scheduled Meds: . amiodarone  400 mg Oral BID  . apixaban  2.5 mg Oral BID  . digoxin  0.125 mg Oral QODAY  . metoprolol tartrate  12.5 mg Oral BID  . polyethylene glycol  17 g Oral BID  . potassium chloride  40 mEq Oral BID  . rosuvastatin  10 mg Oral Daily  . sodium chloride flush  3 mL Intravenous Q12H   Continuous Infusions: . sodium chloride     PRN Meds: sodium chloride, acetaminophen, docusate sodium, ondansetron (ZOFRAN) IV, sodium chloride flush   Vital Signs    Vitals:   03/12/20 0353 03/12/20 0609 03/12/20 0917 03/12/20 0920  BP: (!) 111/56   119/62  Pulse: 66  79 74  Resp: 17 17 19 16   Temp: 98.4 F (36.9 C)  98.3 F (36.8 C) 98.3 F (36.8 C)  TempSrc: Oral  Oral Oral  SpO2: 95%  95% 95%  Weight:  92.4 kg    Height:        Intake/Output Summary (Last 24 hours) at 03/12/2020 1319 Last data filed at 03/12/2020 7253 Gross per 24 hour  Intake 105.29 ml  Output 2000 ml  Net -1894.71 ml   Filed Weights   03/11/20 0421 03/11/20 1010 03/12/20 0609  Weight: 95.7 kg 94.4 kg 92.4 kg    Telemetry    Atrial fibrillation rates in the 70s- Personally Reviewed  ECG    No recent- Personally Reviewed  Physical Exam   GEN: No acute distress.  Appears frail Neck: No JVD though difficult to assess Cardiac:  Irregular rhythm, normal rate Respiratory: Clear to auscultation bilaterally anteriorly. GI: Soft, nontender, non-distended  MS:  Trace ankle edema; No deformity. Neuro:  Nonfocal  Psych: Normal affect   Labs    Chemistry Recent Labs  Lab 03/10/20 0124 03/11/20 0146 03/12/20 0057  NA 139 135 137  K 3.4* 4.6  4.1  CL 89* 90* 93*  CO2 39* 36* 33*  GLUCOSE 133* 144* 133*  BUN 69* 66* 67*  CREATININE 1.81* 1.71* 2.14*  CALCIUM 9.7 9.3 9.3  GFRNONAA 26* 27* 21*  ANIONGAP 11 9 11      Hematology Recent Labs  Lab 03/07/20 0228  WBC 5.7  RBC 3.91  HGB 12.3  HCT 38.8  MCV 99.2  MCH 31.5  MCHC 31.7  RDW 17.4*  PLT 69*    Cardiac EnzymesNo results for input(s): TROPONINI in the last 168 hours. No results for input(s): TROPIPOC in the last 168 hours.   BNPNo results for input(s): BNP, PROBNP in the last 168 hours.   DDimer No results for input(s): DDIMER in the last 168 hours.   Radiology    No results found.  Cardiac Studies   Echo:  1. Left ventricular ejection fraction, by estimation, is 40 to 45%. The  left ventricle has mildly decreased function. The left ventricle  demonstrates global hypokinesis. There is mild concentric left ventricular  hypertrophy. Left ventricular diastolic  function could not be evaluated.  2. Right ventricular systolic function is mildly reduced. The right  ventricular size is mildly enlarged. There is moderately elevated  pulmonary artery systolic pressure. The estimated right ventricular  systolic  pressure is 61.0 mmHg.  3. Left atrial size was severely dilated.  4. Right atrial size was severely dilated.  5. The mitral valve is normal in structure. Mild to moderate mitral valve  regurgitation.  6. The aortic valve has been repaired/replaced. Aortic valve  regurgitation is not visualized. There is a 26 mm Medtronic  CoreValve-EvolutR prosthetic (TAVR) valve present in the aortic position.  Procedure Date: September 2018. Echo findings are  consistent with normal structure and function of the aortic valve  prosthesis.  7. The inferior vena cava is dilated in size with <50% respiratory  variability, suggesting right atrial pressure of 15 mmHg.   Comparison(s): Changes from prior study are noted. The left ventricular  function is  worsened.   Patient Profile     84 y.o. female admitted to the hospital for decompensated heart failure and atrial fibrillation with rapid ventricular response.  History of severe AS status post TAVR 01/2017 with 26 mm Medtronic CoreValve evolute pro, CAD with multivessel PCI in 2018, heart failure with reduced ejection fraction EF 40 to 45%, persistent atrial fibrillation on Eliquis, breast cancer status post lumpectomy and history of radiation, DM2, hypertension, hyperlipidemia.  Assessment & Plan   Principal Problem:   Acute on chronic systolic CHF (congestive heart failure) (HCC) Active Problems:   CAD- severe 3V CAD    Chronic respiratory failure with hypoxia (HCC)   Atrial fibrillation with rapid ventricular response (HCC)   Secondary hypercoagulable state (HCC)   AKI (acute kidney injury) ([place])   Thrombocytopenia (HCC)   Pressure injury of skin   Persistent atrial fibrillation- CHA2DS2-VASc score is 6 (heart failure, hypertension, age x2, CAD, female sex) -Continue Eliquis, 2.5 mg twice daily-reduced dose due to age and renal function -Rate is currently well controlled, continue digoxin and metoprolol as well as amiodarone.  When better compensated from a heart failure standpoint will consider DCCV, likely early next week.  Acute on chronic systolic and diastolic heart failure- AKI on CKD, baseline Cr 1.4 Has been on Lasix drip with adequate response.  Loss of IV access yesterday, with brief interruption she is back on IV Lasix drip today.  Weight is 203 pounds down from 242 pounds. -Renal function has declined today, Cr 2.14 from 1.71, will discontinue IV Lasix drip and reassess volume in a.m., could consider initiating torsemide if renal function is stable, will reassess creatinine in AM.  CAD status post PCI in 2018 -Continues on DOAC.  Not currently on aspirin -Continue statin and beta-blocker  Aortic stenosis status post TAVR -Stable per last echo.      For questions  or updates, please contact [place] Please consult [email or web address] for contact info under        Signed, Elouise Munroe, MD  03/12/2020, 1:19 PM

## 2020-03-12 NOTE — Progress Notes (Addendum)
PROGRESS NOTE   As per Dr. Cathlean Sauer: Carrie Monroe was admitted with the working diagnosis of acute on chronic systolic heart failure exacerbation, complicated with atrial fibrillation with rapid ventricular response  84 yo female with past medical history ofatrial fibrillation, systolic heart failure, severe aortic stenosis status post TAVR,andchronic kidney disease stage IIIb who presented with dyspnea and weight gain. Patient reported progressive, and worsening lower extremity edema, orthopnea, dyspnea on exertion and 11 pound weight gain. Her symptoms were refractive to increased dose of furosemide as an outpatient. Due to worsening symptoms she called EMS, she was found in atrial fibrillation with rapid ventricular response, she received 10 mg of IV diltiazem on route before arriving to the ED. On her initial physical examination she was tachycardic 131 bpm, tachypneic 26 breaths/min and hypoxic, oxygen saturation 80% on 2 L per nasal cannula, blood pressure 116/63, positive JVD, lungs with no wheezing, heart S1-S2 present, tachycardic, irregularly irregular, abdomen soft, Positive3+ bilateral lower extremity pitting edema. Sodium 140, potassium 4.2, chloride 102, bicarb 27, glucose 153, BUN 63, creatinine 2.79, magnesium 2.5, BNP 529, white count 8.1, hemoglobin 13.7, hematocrit 43.9, platelets 92. SARS COVID-19 negative. Urinalysis negative for infection,specific gravity 1.009. Chest radiograph with bilateral interstitial infiltrates at the lower zones, positive small bilateral pleural effusions. EKG 136 bpm, left axis deviation, left bundle branch block, measured QTC 400 ms, atrial fibrillation rhythm, noisy baseline, poor R wave progression, no significant ST segment changes, V5-V6 T wave inversion.  Patient was placed on diltiazem infusion and aggressive diuresis with furosemide.   Diltiazem was changed toamiodarone per cariology recommendations with good toleration.  She  has severehypervolemia, placed onfurosemide drip to target further negative fluid balance.(metolazone x2on 10/08-10/11)   Carrie Monroe  RXV:[id number] DOB: [birth year] DOA: 03/02/2020 PCP: Marton Redwood, MD  Assessment & Plan:   Principal Problem:   Acute on chronic systolic CHF (congestive heart failure) ([place]) Active Problems:   CAD- severe 3V CAD    Chronic respiratory failure with hypoxia (HCC)   Atrial fibrillation with rapid ventricular response (HCC)   Secondary hypercoagulable state ([place])   AKI (acute kidney injury) ([place])   Thrombocytopenia ([place])   Pressure injury of skin   Acute on chronic systolic heart failure exacerbation: echo shows EF 40-45%, b/l atrial dilatation. Continue on lasix drip. Neg 2.4L today. Monitor I/Os. Continue on metoprolol, digoxin. Cardio recs apprec    Aortic Stenosis: s/p TAVR.Continue on tele    Atrial fib: w/ RVR.Continue on metoprolol, amiodarone, digoxin & eliquis.   AKI on CKDIIIa: Cr is trending up from day prior. Will continue to monitor   Hypokalemia: WNL today. Will continue to monitor  Likely contraction alkalosis: likely secondary to lasix use. Will continue to monitor   Thrombocytopenia: etiology unclear. Continue eliquis. Will continue to monitor   CAD: w/ elevated troponins secondary to demand ischemia.   HLD: continue on statin   Stage 2 sacrum pressure ulcer: present on admission.Continue w/ wound care   Reactive hyperglycemia: HbA1c 5.4. Will continue to monitor    DVT prophylaxis: eliquis  Code Status: full  Family Communication: Disposition Plan: PT/OT recs SNF   Status is: Inpatient  Remains inpatient appropriate because:Unsafe d/c plan and IV treatments appropriate due to intensity of illness or inability to take PO   Dispo: The patient is from: Home              Anticipated d/c is to: SNF  Anticipated d/c date is: 3 days              Patient currently is not medically stable to  d/c.   Consultants:   cardio   Procedures:    Antimicrobials   Subjective: Pt c/o malaise   Objective: Vitals:   03/11/20 2023 03/11/20 2334 03/12/20 0353 03/12/20 0609  BP: 117/61 (!) 108/58 (!) 111/56   Pulse: 79 76 66   Resp: 19 16 17 17   Temp: 98.2 F (36.8 C) 97.9 F (36.6 C) 98.4 F (36.9 C)   TempSrc: Oral Oral Oral   SpO2: 96% 97% 95%   Weight:    92.4 kg  Height:        Intake/Output Summary (Last 24 hours) at 03/12/2020 0736 Last data filed at 03/12/2020 1696 Gross per 24 hour  Intake 341.41 ml  Output 2800 ml  Net -2458.59 ml   Filed Weights   03/11/20 0421 03/11/20 1010 03/12/20 0609  Weight: 95.7 kg 94.4 kg 92.4 kg    Examination:  General exam: Appears calm and comfortable  Respiratory system: diminished breath sounds b/l. No rales, rhonchi  Cardiovascular system: irregularly irregular. No rubs, gallops or clicks. Gastrointestinal system: Abdomen is nondistended, soft and nontender. Normal bowel sounds heard. Central nervous system: Alert and oriented. Moves all 4 extremities  Psychiatry: Judgement and insight appear normal. Flat mood and affect     Data Reviewed: I have personally reviewed following labs and imaging studies  CBC: Recent Labs  Lab 03/07/20 0228  WBC 5.7  HGB 12.3  HCT 38.8  MCV 99.2  PLT 69*   Basic Metabolic Panel: Recent Labs  Lab 03/06/20 0124 03/06/20 1451 03/07/20 0228 03/07/20 1300 03/08/20 0128 03/09/20 0328 03/10/20 0124 03/11/20 0146 03/12/20 0057  NA  --    < >  --    < > 139 137 139 135 137  K  --    < >  --    < > 2.9* 3.3* 3.4* 4.6 4.1  CL  --    < >  --    < > 90* 87* 89* 90* 93*  CO2  --    < >  --    < > 37* 41* 39* 36* 33*  GLUCOSE  --    < >  --    < > 154* 146* 133* 144* 133*  BUN  --    < >  --    < > 65* 66* 69* 66* 67*  CREATININE  --    < >  --    < > 2.39* 2.13* 1.81* 1.71* 2.14*  CALCIUM  --    < >  --    < > 9.5 9.4 9.7 9.3 9.3  MG 2.0  --  2.0  --  2.1  --  2.0  --  1.9    < > = values in this interval not displayed.   GFR: Estimated Creatinine Clearance: 21.9 mL/min (A) (by C-G formula based on SCr of 2.14 mg/dL (H)). Liver Function Tests: No results for input(s): AST, ALT, ALKPHOS, BILITOT, PROT, ALBUMIN in the last 168 hours. No results for input(s): LIPASE, AMYLASE in the last 168 hours. No results for input(s): AMMONIA in the last 168 hours. Coagulation Profile: No results for input(s): INR, PROTIME in the last 168 hours. Cardiac Enzymes: No results for input(s): CKTOTAL, CKMB, CKMBINDEX, TROPONINI in the last 168 hours. BNP (last 3 results) No results for input(s): PROBNP in the last  8760 hours. HbA1C: No results for input(s): HGBA1C in the last 72 hours. CBG: No results for input(s): GLUCAP in the last 168 hours. Lipid Profile: No results for input(s): CHOL, HDL, LDLCALC, TRIG, CHOLHDL, LDLDIRECT in the last 72 hours. Thyroid Function Tests: No results for input(s): TSH, T4TOTAL, FREET4, T3FREE, THYROIDAB in the last 72 hours. Anemia Panel: No results for input(s): VITAMINB12, FOLATE, FERRITIN, TIBC, IRON, RETICCTPCT in the last 72 hours. Sepsis Labs: No results for input(s): PROCALCITON, LATICACIDVEN in the last 168 hours.  Recent Results (from the past 240 hour(s))  Respiratory Panel by RT PCR (Flu A&B, Covid) - Nasopharyngeal Swab     Status: None   Collection Time: 03/02/20  7:43 PM   Specimen: Nasopharyngeal Swab  Result Value Ref Range Status   SARS Coronavirus 2 by RT PCR NEGATIVE NEGATIVE Final    Comment: (NOTE) SARS-CoV-2 target nucleic acids are NOT DETECTED.  The SARS-CoV-2 RNA is generally detectable in upper respiratoy specimens during the acute phase of infection. The lowest concentration of SARS-CoV-2 viral copies this assay can detect is 131 copies/mL. A negative result does not preclude SARS-Cov-2 infection and should not be used as the sole basis for treatment or other patient management decisions. A negative result  may occur with  improper specimen collection/handling, submission of specimen other than nasopharyngeal swab, presence of viral mutation(s) within the areas targeted by this assay, and inadequate number of viral copies (<131 copies/mL). A negative result must be combined with clinical observations, patient history, and epidemiological information. The expected result is Negative.  Fact Sheet for Patients:  PinkCheek.be  Fact Sheet for Healthcare Providers:  GravelBags.it  This test is no t yet approved or cleared by the [place] FDA and  has been authorized for detection and/or diagnosis of SARS-CoV-2 by FDA under an Emergency Use Authorization (EUA). This EUA will remain  in effect (meaning this test can be used) for the duration of the COVID-19 declaration under Section 564(b)(1) of the Act, 21 U.S.C. section 360bbb-3(b)(1), unless the authorization is terminated or revoked sooner.     Influenza A by PCR NEGATIVE NEGATIVE Final   Influenza B by PCR NEGATIVE NEGATIVE Final    Comment: (NOTE) The Xpert Xpress SARS-CoV-2/FLU/RSV assay is intended as an aid in  the diagnosis of influenza from Nasopharyngeal swab specimens and  should not be used as a sole basis for treatment. Nasal washings and  aspirates are unacceptable for Xpert Xpress SARS-CoV-2/FLU/RSV  testing.  Fact Sheet for Patients: PinkCheek.be  Fact Sheet for Healthcare Providers: GravelBags.it  This test is not yet approved or cleared by the [place] FDA and  has been authorized for detection and/or diagnosis of SARS-CoV-2 by  FDA under an Emergency Use Authorization (EUA). This EUA will remain  in effect (meaning this test can be used) for the duration of the  Covid-19 declaration under Section 564(b)(1) of the Act, 21  U.S.C. section 360bbb-3(b)(1), unless the authorization is  terminated  or revoked. Performed at [facility], [place] [address], [place], [place] [address]   Urine culture     Status: Abnormal   Collection Time: 03/02/20  9:54 PM   Specimen: Urine, Random  Result Value Ref Range Status   Specimen Description URINE, RANDOM  Final   Special Requests   Final    NONE Performed at [facility], [place] [address] [place], [place] [address]    Culture >=100,000 COLONIES/mL PSEUDOMONAS AERUGINOSA (A)  Final   Report  Status 03/04/2020 FINAL  Final   Organism ID, Bacteria PSEUDOMONAS AERUGINOSA (A)  Final      Susceptibility   Pseudomonas aeruginosa - MIC*    CEFTAZIDIME 4 SENSITIVE Sensitive     CIPROFLOXACIN <=0.25 SENSITIVE Sensitive     GENTAMICIN <=1 SENSITIVE Sensitive     IMIPENEM 2 SENSITIVE Sensitive     PIP/TAZO 8 SENSITIVE Sensitive     CEFEPIME 2 SENSITIVE Sensitive     * >=100,000 COLONIES/mL PSEUDOMONAS AERUGINOSA         Radiology Studies: No results found.      Scheduled Meds: . amiodarone  400 mg Oral BID  . apixaban  2.5 mg Oral BID  . digoxin  0.125 mg Oral QODAY  . metoprolol tartrate  12.5 mg Oral BID  . polyethylene glycol  17 g Oral BID  . potassium chloride  40 mEq Oral BID  . rosuvastatin  10 mg Oral Daily  . sodium chloride flush  3 mL Intravenous Q12H   Continuous Infusions: . sodium chloride    . furosemide (LASIX) infusion 10 mg/hr (03/11/20 2150)     LOS: 10 days    Time spent: 31 mins     Wyvonnia Dusky, MD [facility] Pager [phone]  If 7PM-7AM, please contact night-coverage [email or web address] 03/12/2020, 7:36 AM

## 2020-03-12 NOTE — Progress Notes (Signed)
Mobility Specialist - Progress Note   03/12/20 1328  Mobility  Activity Ambulated in hall  Level of Assistance Contact guard assist, steadying assist  Assistive Device Front wheel walker  Distance Ambulated (ft) 80 ft (Intervals: 40 ft x 2)  Mobility Response Tolerated fair  Mobility performed by Mobility specialist  $Mobility charge 1 Mobility    Pre-mobility: 77 HR, 96% SpO2 During mobility: 85 HR, 91% SpO2 Post-mobility: 75 HR, 96% SpO2  Pt required a seated rest break due to her legs feeling weak. Pt back in chair after ambulating.   Pricilla Handler Mobility Specialist Mobility Specialist Phone: [phone]

## 2020-03-13 DIAGNOSIS — I5023 Acute on chronic systolic (congestive) heart failure: Secondary | ICD-10-CM | POA: Diagnosis not present

## 2020-03-13 LAB — CBC
HCT: 41.1 % (ref 36.0–46.0)
Hemoglobin: 13.2 g/dL (ref 12.0–15.0)
MCH: 32.6 pg (ref 26.0–34.0)
MCHC: 32.1 g/dL (ref 30.0–36.0)
MCV: 101.5 fL — ABNORMAL HIGH (ref 80.0–100.0)
Platelets: 105 10*3/uL — ABNORMAL LOW (ref 150–400)
RBC: 4.05 MIL/uL (ref 3.87–5.11)
RDW: 16.1 % — ABNORMAL HIGH (ref 11.5–15.5)
WBC: 5.5 10*3/uL (ref 4.0–10.5)
nRBC: 0 % (ref 0.0–0.2)

## 2020-03-13 MED ORDER — FUROSEMIDE 10 MG/ML IJ SOLN
80.0000 mg | Freq: Once | INTRAMUSCULAR | Status: AC
  Administered 2020-03-13: 80 mg via INTRAVENOUS
  Filled 2020-03-13: qty 8

## 2020-03-13 NOTE — Progress Notes (Signed)
Progress Note  Patient Name: Carrie Monroe Date of Encounter: 03/13/2020  Primary Cardiologist: Sanda Klein, MD   Subjective   Feels LE swelling continues to improve.  Inpatient Medications    Scheduled Meds:  amiodarone  400 mg Oral BID   apixaban  2.5 mg Oral BID   digoxin  0.125 mg Oral QODAY   furosemide  80 mg Intravenous Once   metoprolol tartrate  12.5 mg Oral BID   polyethylene glycol  17 g Oral BID   potassium chloride  40 mEq Oral BID   rosuvastatin  10 mg Oral Daily   sodium chloride flush  3 mL Intravenous Q12H   Continuous Infusions:  sodium chloride     PRN Meds: sodium chloride, acetaminophen, docusate sodium, ondansetron (ZOFRAN) IV, sodium chloride flush   Vital Signs    Vitals:   03/12/20 2336 03/13/20 0521 03/13/20 0700 03/13/20 0834  BP: (!) 113/59 (!) 114/55  (!) 115/53  Pulse: 85 67  75  Resp: 18 18  18   Temp: (!) 97.5 F (36.4 C) 98.1 F (36.7 C)  97.8 F (36.6 C)  TempSrc: Oral Oral  Oral  SpO2: 96% 96%  99%  Weight:   93.8 kg   Height:        Intake/Output Summary (Last 24 hours) at 03/13/2020 1141 Last data filed at 03/13/2020 0944 Gross per 24 hour  Intake 708 ml  Output 1080 ml  Net -372 ml   Filed Weights   03/11/20 1010 03/12/20 0609 03/13/20 0700  Weight: 94.4 kg 92.4 kg 93.8 kg    Telemetry    Atrial fibrillation rates in the 70s- Personally Reviewed  ECG    No recent- Personally Reviewed  Physical Exam   GEN: No acute distress.   Neck: No JVD Cardiac: iRRR, no murmurs, rubs, or gallops.  Respiratory: Clear to auscultation bilaterally. GI: Soft, nontender, non-distended  MS: trivial pedal edema; No deformity. Neuro:  Nonfocal  Psych: Normal affect    Labs    Chemistry Recent Labs  Lab 03/10/20 0124 03/11/20 0146 03/12/20 0057  NA 139 135 137  K 3.4* 4.6 4.1  CL 89* 90* 93*  CO2 39* 36* 33*  GLUCOSE 133* 144* 133*  BUN 69* 66* 67*  CREATININE 1.81* 1.71* 2.14*  CALCIUM  9.7 9.3 9.3  GFRNONAA 26* 27* 21*  ANIONGAP 11 9 11      Hematology Recent Labs  Lab 03/07/20 0228 03/13/20 0836  WBC 5.7 5.5  RBC 3.91 4.05  HGB 12.3 13.2  HCT 38.8 41.1  MCV 99.2 101.5*  MCH 31.5 32.6  MCHC 31.7 32.1  RDW 17.4* 16.1*  PLT 69* 105*    Cardiac EnzymesNo results for input(s): TROPONINI in the last 168 hours. No results for input(s): TROPIPOC in the last 168 hours.   BNPNo results for input(s): BNP, PROBNP in the last 168 hours.   DDimer No results for input(s): DDIMER in the last 168 hours.   Radiology    No results found.  Cardiac Studies   Echo:  1. Left ventricular ejection fraction, by estimation, is 40 to 45%. The  left ventricle has mildly decreased function. The left ventricle  demonstrates global hypokinesis. There is mild concentric left ventricular  hypertrophy. Left ventricular diastolic  function could not be evaluated.  2. Right ventricular systolic function is mildly reduced. The right  ventricular size is mildly enlarged. There is moderately elevated  pulmonary artery systolic pressure. The estimated right ventricular  systolic pressure  is 61.0 mmHg.  3. Left atrial size was severely dilated.  4. Right atrial size was severely dilated.  5. The mitral valve is normal in structure. Mild to moderate mitral valve  regurgitation.  6. The aortic valve has been repaired/replaced. Aortic valve  regurgitation is not visualized. There is a 26 mm Medtronic  CoreValve-EvolutR prosthetic (TAVR) valve present in the aortic position.  Procedure Date: September 2018. Echo findings are  consistent with normal structure and function of the aortic valve  prosthesis.  7. The inferior vena cava is dilated in size with <50% respiratory  variability, suggesting right atrial pressure of 15 mmHg.   Comparison(s): Changes from prior study are noted. The left ventricular  function is worsened.   Patient Profile     84 y.o. female admitted to the  hospital for decompensated heart failure and atrial fibrillation with rapid ventricular response.  History of severe AS status post TAVR 01/2017 with 26 mm Medtronic CoreValve evolute pro, CAD with multivessel PCI in 2018, heart failure with reduced ejection fraction EF 40 to 45%, persistent atrial fibrillation on Eliquis, breast cancer status post lumpectomy and history of radiation, DM2, hypertension, hyperlipidemia.  Assessment & Plan   Principal Problem:   Acute on chronic systolic CHF (congestive heart failure) (HCC) Active Problems:   CAD- severe 3V CAD    Chronic respiratory failure with hypoxia (HCC)   Atrial fibrillation with rapid ventricular response (HCC)   Secondary hypercoagulable state (HCC)   AKI (acute kidney injury) ([place])   Thrombocytopenia (HCC)   Pressure injury of skin   Persistent atrial fibrillation- CHA2DS2-VASc score is 6 (heart failure, hypertension, age x2, CAD, female sex) -Continue Eliquis, 2.5 mg twice daily-reduced dose due to age and renal function -Rate is currently well controlled, continue digoxin and metoprolol as well as amiodarone.  When better compensated from a heart failure standpoint will consider DCCV, likely early next week.  Acute on chronic systolic and diastolic heart failure- AKI on CKD, baseline Cr 1.4 - previously on lasix drip. Cr increased yesterday so Lasix held. Volume status is stable. Will give bolus dose of lasix 80 mg IV now, will reassess for oral diuretics in AM with morning labs available. Patient declines repeat lab draw today due to unpleasant experience this morning.  CAD status post PCI in 2018 -Continues on DOAC.  Not currently on aspirin -Continue statin and beta-blocker  Aortic stenosis status post TAVR -Stable per last echo.   For questions or updates, please contact [place] Please consult [email or web address] for contact info under        Signed, Elouise Munroe, MD  03/13/2020, 11:41 AM

## 2020-03-13 NOTE — Progress Notes (Signed)
Mobility Specialist - Progress Note   03/13/20 1026  Mobility  Activity Refused mobility     Pt refused due to feeling fatigued. Will f/u as able.   Pricilla Handler Mobility Specialist Mobility Specialist Phone: [phone]

## 2020-03-13 NOTE — Plan of Care (Signed)
Continue to monitor

## 2020-03-13 NOTE — Progress Notes (Signed)
Mobility Specialist - Progress Note   03/13/20 1330  Mobility  Activity Ambulated in hall  Level of Assistance Standby assist, set-up cues, supervision of patient - no hands on  Assistive Device Front wheel walker  Distance Ambulated (ft) 80 ft (30 ft x 1, 50 ft x 1)  Mobility Response Tolerated fair  Mobility performed by Mobility specialist  $Mobility charge 1 Mobility    Pre-mobility: 67 HR, 98% SpO2 During mobility: 77 HR, 91% SpO2 Post-mobility: 70 HR, 100% SpO2  Pt required one seated rest break due to her legs feeling "whobbly". Pt assisted back into bed after ambulation and O2 was dec to 1 L as she was 100% on 2 L. RN made aware.   Pricilla Handler Mobility Specialist Mobility Specialist Phone: [phone]

## 2020-03-13 NOTE — Progress Notes (Signed)
PROGRESS NOTE   As per Dr. Cathlean Sauer: Carrie Monroe was admitted with the working diagnosis of acute on chronic systolic heart failure exacerbation, complicated with atrial fibrillation with rapid ventricular response  84 yo female with past medical history ofatrial fibrillation, systolic heart failure, severe aortic stenosis status post TAVR,andchronic kidney disease stage IIIb who presented with dyspnea and weight gain. Patient reported progressive, and worsening lower extremity edema, orthopnea, dyspnea on exertion and 11 pound weight gain. Her symptoms were refractive to increased dose of furosemide as an outpatient. Due to worsening symptoms she called EMS, she was found in atrial fibrillation with rapid ventricular response, she received 10 mg of IV diltiazem on route before arriving to the ED. On her initial physical examination she was tachycardic 131 bpm, tachypneic 26 breaths/min and hypoxic, oxygen saturation 80% on 2 L per nasal cannula, blood pressure 116/63, positive JVD, lungs with no wheezing, heart S1-S2 present, tachycardic, irregularly irregular, abdomen soft, Positive3+ bilateral lower extremity pitting edema. Sodium 140, potassium 4.2, chloride 102, bicarb 27, glucose 153, BUN 63, creatinine 2.79, magnesium 2.5, BNP 529, white count 8.1, hemoglobin 13.7, hematocrit 43.9, platelets 92. SARS COVID-19 negative. Urinalysis negative for infection,specific gravity 1.009. Chest radiograph with bilateral interstitial infiltrates at the lower zones, positive small bilateral pleural effusions. EKG 136 bpm, left axis deviation, left bundle branch block, measured QTC 400 ms, atrial fibrillation rhythm, noisy baseline, poor R wave progression, no significant ST segment changes, V5-V6 T wave inversion.  Patient was placed on diltiazem infusion and aggressive diuresis with furosemide.   Diltiazem was changed toamiodarone per cariology recommendations with good toleration.  She  has severehypervolemia, placed onfurosemide drip to target further negative fluid balance.(metolazone x2on 10/08-10/11)   Carrie Monroe  JOI:[id number] DOB: 1936-04-26 DOA: 03/02/2020 PCP: Marton Redwood, MD  Assessment & Plan:   Principal Problem:   Acute on chronic systolic CHF (congestive heart failure) ([place]) Active Problems:   CAD- severe 3V CAD    Chronic respiratory failure with hypoxia (HCC)   Atrial fibrillation with rapid ventricular response (HCC)   Secondary hypercoagulable state ([place])   AKI (acute kidney injury) ([place])   Thrombocytopenia ([place])   Pressure injury of skin   Acute on chronic systolic heart failure exacerbation: echo shows EF 40-45%, b/l atrial dilatation. Continue on lasix drip. Neg 2.4L today. Monitor I/Os. Continue on metoprolol, digoxin. Cardio recs apprec.  Intake/Output Summary (Last 24 hours) at 03/13/2020 0834 Last data filed at 03/12/2020 2118 Gross per 24 hour  Intake 830 ml  Output 1270 ml  Net -440 ml    I/O last 3 completed shifts: In: 935.3 [P.O.:830; I.V.:105.3] Out: 2970 [Urine:2970] No intake/output data recorded.  Aortic Stenosis: s/p TAVR.Continue on telemetry.   Atrial fib: w/ OBS:[id number] on metoprolol, amiodarone, digoxin & eliquis.   AKI on CKDIIIa: Cr is trending up from day prior. Will continue to monitor  Lab Results  Component Value Date   CREATININE 2.14 (H) 03/12/2020   CREATININE 1.71 (H) 03/11/2020   CREATININE 1.81 (H) 03/10/2020  Avoid nephrotoxic agents and meds renally dose all meds.    Hypokalemia: WNL today. Will continue to monitor in light of intermittent and ongoing use of diuretic therapy.   Likely contraction alkalosis: likely secondary to lasix use. Will continue to monitor,Today serum pco2 is 33.  Thrombocytopenia: etiology unclear. Continue eliquis. We will recheck cbc today..  CAD: w/ elevated troponins secondary to demand ischemia. Greatly appreciated cardiology management and care.     HLD: continue on statin ,crestor  10 mg.   Stage 2 sacrum pressure ulcer: present on admission.Continue w/ wound care.   Reactive hyperglycemia: HbA1c 5.4. Will continue to monitor    DVT prophylaxis: eliquis  Code Status: full  Family Communication: Disposition Plan: PT/OT recs SNF   Status is: Inpatient  Remains inpatient appropriate because:Unsafe d/c plan and IV treatments appropriate due to intensity of illness or inability to take PO   Dispo: The patient is from: Home              Anticipated d/c is to: SNF              Anticipated d/c date is: 3 days              Patient currently is not medically stable to d/c.   Consultants:   cardio  Procedures:  None. Antimicrobials Anti-infectives (From admission, onward)   None      Subjective: Pt c/o malaise  03/13/2020: Pt seen today for f/u of her A.fib rvr with CHF exacerbation and worsening renal failure. Pt denies any complaints otherwise.  Objective: Vitals:   03/12/20 2117 03/12/20 2336 03/13/20 0521 03/13/20 0700  BP: (!) 105/57 (!) 113/59 (!) 114/55   Pulse: 85 85 67   Resp: 19 18 18    Temp: 98.2 F (36.8 C) (!) 97.5 F (36.4 C) 98.1 F (36.7 C)   TempSrc: Oral Oral Oral   SpO2: 97% 96% 96%   Weight:    93.8 kg  Height:        Intake/Output Summary (Last 24 hours) at 03/13/2020 0820 Last data filed at 03/12/2020 2118 Gross per 24 hour  Intake 830 ml  Output 1270 ml  Net -440 ml   Filed Weights   03/11/20 1010 03/12/20 0609 03/13/20 0700  Weight: 94.4 kg 92.4 kg 93.8 kg    Examination: General exam: Appears calm and comfortable  Respiratory system: diminished breath sounds b/l. No rales, rhonchi  Cardiovascular system: irregularly irregular. No rubs, gallops or clicks. Gastrointestinal system: Abdomen is nondistended, soft and nontender. Normal bowel sounds heard. Central nervous system: Alert and oriented. Moves all 4 extremities  Psychiatry: Judgement and insight appear normal.  Flat mood and affect  Data Reviewed: I have personally reviewed following labs and imaging studies  CBC: Recent Labs  Lab 03/07/20 0228  WBC 5.7  HGB 12.3  HCT 38.8  MCV 99.2  PLT 69*   Basic Metabolic Panel: Recent Labs  Lab 03/07/20 0228 03/07/20 1300 03/08/20 0128 03/09/20 0328 03/10/20 0124 03/11/20 0146 03/12/20 0057  NA  --    < > 139 137 139 135 137  K  --    < > 2.9* 3.3* 3.4* 4.6 4.1  CL  --    < > 90* 87* 89* 90* 93*  CO2  --    < > 37* 41* 39* 36* 33*  GLUCOSE  --    < > 154* 146* 133* 144* 133*  BUN  --    < > 65* 66* 69* 66* 67*  CREATININE  --    < > 2.39* 2.13* 1.81* 1.71* 2.14*  CALCIUM  --    < > 9.5 9.4 9.7 9.3 9.3  MG 2.0  --  2.1  --  2.0  --  1.9   < > = values in this interval not displayed.   GFR: Estimated Creatinine Clearance: 22.1 mL/min (A) (by C-G formula based on SCr of 2.14 mg/dL (H)).  Radiology Studies: No results found.  cheduled  Meds:  amiodarone  400 mg Oral BID   apixaban  2.5 mg Oral BID   digoxin  0.125 mg Oral QODAY   metoprolol tartrate  12.5 mg Oral BID   polyethylene glycol  17 g Oral BID   potassium chloride  40 mEq Oral BID   rosuvastatin  10 mg Oral Daily   sodium chloride flush  3 mL Intravenous Q12H   Continuous Infusions:  sodium chloride       LOS: 11 days   Time spent: 45 mins  Carrie Skeans, MD [facility] Pager [phone]  If 7PM-7AM, please contact night-coverage [email or web address] 03/13/2020, 8:20 AM

## 2020-03-14 DIAGNOSIS — I5023 Acute on chronic systolic (congestive) heart failure: Secondary | ICD-10-CM | POA: Diagnosis not present

## 2020-03-14 LAB — BASIC METABOLIC PANEL
Anion gap: 7 (ref 5–15)
BUN: 67 mg/dL — ABNORMAL HIGH (ref 8–23)
CO2: 33 mmol/L — ABNORMAL HIGH (ref 22–32)
Calcium: 9.6 mg/dL (ref 8.9–10.3)
Chloride: 102 mmol/L (ref 98–111)
Creatinine, Ser: 2.22 mg/dL — ABNORMAL HIGH (ref 0.44–1.00)
GFR, Estimated: 20 mL/min — ABNORMAL LOW (ref >60)
Glucose, Bld: 131 mg/dL — ABNORMAL HIGH (ref 70–99)
Potassium: 5 mmol/L (ref 3.5–5.1)
Sodium: 142 mmol/L (ref 135–145)

## 2020-03-14 LAB — CBC WITH DIFFERENTIAL/PLATELET
Abs Immature Granulocytes: 0.02 10*3/uL (ref 0.00–0.07)
Basophils Absolute: 0 10*3/uL (ref 0.0–0.1)
Basophils Relative: 0 %
Eosinophils Absolute: 0.1 10*3/uL (ref 0.0–0.5)
Eosinophils Relative: 2 %
HCT: 40.2 % (ref 36.0–46.0)
Hemoglobin: 12.5 g/dL (ref 12.0–15.0)
Immature Granulocytes: 0 %
Lymphocytes Relative: 16 %
Lymphs Abs: 0.8 10*3/uL (ref 0.7–4.0)
MCH: 31.6 pg (ref 26.0–34.0)
MCHC: 31.1 g/dL (ref 30.0–36.0)
MCV: 101.5 fL — ABNORMAL HIGH (ref 80.0–100.0)
Monocytes Absolute: 0.5 10*3/uL (ref 0.1–1.0)
Monocytes Relative: 10 %
Neutro Abs: 3.8 10*3/uL (ref 1.7–7.7)
Neutrophils Relative %: 72 %
Platelets: 103 10*3/uL — ABNORMAL LOW (ref 150–400)
RBC: 3.96 MIL/uL (ref 3.87–5.11)
RDW: 16.1 % — ABNORMAL HIGH (ref 11.5–15.5)
WBC: 5.4 10*3/uL (ref 4.0–10.5)
nRBC: 0 % (ref 0.0–0.2)

## 2020-03-14 MED ORDER — FUROSEMIDE 80 MG PO TABS: 80.0000 mg | ORAL_TABLET | Freq: Every day | ORAL | Status: DC

## 2020-03-14 MED ORDER — POTASSIUM CHLORIDE CRYS ER 20 MEQ PO TBCR
40.0000 meq | EXTENDED_RELEASE_TABLET | Freq: Every day | ORAL | Status: DC
  Administered 2020-03-15 – 2020-03-16 (×2): 40 meq via ORAL
  Filled 2020-03-14 (×2): qty 2

## 2020-03-14 NOTE — NC FL2 (Signed)
[place] LEVEL OF CARE SCREENING TOOL     IDENTIFICATION  Patient Name: Carrie Monroe Birthdate: 12-30-1935 Sex: female Admission Date (Current Location): 03/02/2020  [facility] and [place] Number:  Herbalist and Address:  [facility], [place] [address] [place], [place] [address]      Provider Number: [id number]  Attending Physician Name and Address:  Para Skeans, MD  Relative Name and Phone Number:  Psalms Olarte [phone]    Current Level of Care: SNF Recommended Level of Care: [place] Prior Approval Number:    Date Approved/Denied:   PASRR Number: [id number]  Discharge Plan: SNF    Current Diagnoses: Patient Active Problem List   Diagnosis Date Noted  . Pressure injury of skin 03/03/2020  . Acute on chronic systolic CHF (congestive heart failure) ([place]) 03/02/2020  . AKI (acute kidney injury) ([place]) 03/02/2020  . Thrombocytopenia ([place]) 03/02/2020  . Secondary hypercoagulable state ([place]) 01/13/2020  . Persistent atrial fibrillation ([place])   . Hypoxia   . Acute on chronic diastolic CHF (congestive heart failure), NYHA class 3 ([place]) 11/21/2019  . Atrial fibrillation with rapid ventricular response ([place]) 11/21/2019  . Fever   . Carotid stenosis 03/13/2019  . Encounter for medication monitoring 11/27/2017  . PICC (peripherally inserted central catheter) in place 11/27/2017  . MSSA bacteremia 11/05/2017  . Wound infection after surgery 11/04/2017  . Anemia 09/27/2017  . Hyperlipidemia   . History of kidney stones   . Hemorrhoids   . Exogenous obesity   . Chronic diastolic CHF (congestive heart failure) ([place])   . Arthritis   . Aortic stenosis, severe   . Chronic respiratory failure with hypoxia ([place])   . Hemorrhagic shock ([place])   . 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 Carotid artery stenosis   . Three-vessel CAD-PCI to LAD and LCx, med management of RCA   .  Chronic renal insufficiency, stage III (moderate) ([place]) 12/22/2016  . Dental abscess- s/p multiple tooth extraction 12/21/16 12/22/2016  . CAD- severe 3V CAD    . Essential hypertension 01/27/2016  . Morbid obesity due to excess calories ([place]) 01/27/2016  . Gout 06/22/2013  . Dyslipidemia 05/08/2011  . Severe aortic stenosis 05/08/2011  . Breast cancer of upper-outer quadrant of left female breast ([place]) 03/16/2011    Orientation RESPIRATION BLADDER Height & Weight     Self, Time, Situation, Place  Normal Continent, External catheter Weight: 207 lb 14.3 oz (94.3 kg) Height:  5\' 3"  (160 cm)  BEHAVIORAL SYMPTOMS/MOOD NEUROLOGICAL BOWEL NUTRITION STATUS      Continent Diet (see dc summary)  AMBULATORY STATUS COMMUNICATION OF NEEDS Skin   Extensive Assist Verbally Other (Comment) (Pressure Injury 03/03/20 sacrum mid stage 2)                       Personal Care Assistance Level of Assistance  Bathing, Feeding, Dressing Bathing Assistance: Maximum assistance Feeding assistance: Independent Dressing Assistance: Limited assistance     Functional Limitations Info  Sight, Hearing, Speech Sight Info: Adequate Hearing Info: Adequate Speech Info: Adequate    SPECIAL CARE FACTORS FREQUENCY  PT (By licensed PT), OT (By licensed OT)     PT Frequency: 5x week OT Frequency: 5x week            Contractures Contractures Info: Present    Additional Factors Info  Code Status, Allergies Code Status Info: Full Allergies Info: Oxycodone  Current Medications (03/14/2020):  This is the current hospital active medication list Current Facility-Administered Medications  Medication Dose Route Frequency Provider Last Rate Last Admin  . 0.9 %  sodium chloride infusion  250 mL Intravenous PRN Opyd, Ilene Qua, MD      . acetaminophen (TYLENOL) tablet 650 mg  650 mg Oral Q4H PRN Opyd, Ilene Qua, MD      . amiodarone (PACERONE) tablet 400 mg  400 mg Oral BID Chandrasekhar, Mahesh  A, MD   400 mg at 03/14/20 0829  . apixaban (ELIQUIS) tablet 2.5 mg  2.5 mg Oral BID Opyd, Ilene Qua, MD   2.5 mg at 03/14/20 0829  . docusate sodium (COLACE) capsule 100 mg  100 mg Oral Daily PRN Arrien, Jimmy Picket, MD   100 mg at 03/10/20 1102  . [START ON 03/15/2020] furosemide (LASIX) tablet 80 mg  80 mg Oral Daily Croitoru, Mihai, MD      . ondansetron (ZOFRAN) injection 4 mg  4 mg Intravenous Q6H PRN Opyd, Ilene Qua, MD      . polyethylene glycol (MIRALAX / GLYCOLAX) packet 17 g  17 g Oral BID Tawni Millers, MD   17 g at 03/14/20 0829  . [START ON 03/15/2020] potassium chloride SA (KLOR-CON) CR tablet 40 mEq  40 mEq Oral Daily Croitoru, Mihai, MD      . rosuvastatin (CRESTOR) tablet 10 mg  10 mg Oral Daily Opyd, Ilene Qua, MD   10 mg at 03/14/20 0829  . sodium chloride flush (NS) 0.9 % injection 3 mL  3 mL Intravenous Q12H Opyd, Ilene Qua, MD   3 mL at 03/14/20 0830  . sodium chloride flush (NS) 0.9 % injection 3 mL  3 mL Intravenous PRN Opyd, Ilene Qua, MD         Discharge Medications: Please see discharge summary for a list of discharge medications.  Relevant Imaging Results:  Relevant Lab Results:   Additional Information    Rees Santistevan B Mesha Schamberger, LCSWA

## 2020-03-14 NOTE — Progress Notes (Signed)
PROGRESS NOTE   As per Dr. Cathlean Sauer: Carrie Monroe was admitted with the working diagnosis of acute on chronic systolic heart failure exacerbation, complicated with atrial fibrillation with rapid ventricular response  84 yo female with past medical history ofatrial fibrillation, systolic heart failure, severe aortic stenosis status post TAVR,andchronic kidney disease stage IIIb who presented with dyspnea and weight gain. Patient reported progressive, and worsening lower extremity edema, orthopnea, dyspnea on exertion and 11 pound weight gain. Her symptoms were refractive to increased dose of furosemide as an outpatient. Due to worsening symptoms she called EMS, she was found in atrial fibrillation with rapid ventricular response, she received 10 mg of IV diltiazem on route before arriving to the ED. On her initial physical examination she was tachycardic 131 bpm, tachypneic 26 breaths/min and hypoxic, oxygen saturation 80% on 2 L per nasal cannula, blood pressure 116/63, positive JVD, lungs with no wheezing, heart S1-S2 present, tachycardic, irregularly irregular, abdomen soft, Positive3+ bilateral lower extremity pitting edema. Sodium 140, potassium 4.2, chloride 102, bicarb 27, glucose 153, BUN 63, creatinine 2.79, magnesium 2.5, BNP 529, white count 8.1, hemoglobin 13.7, hematocrit 43.9, platelets 92. SARS COVID-19 negative. Urinalysis negative for infection,specific gravity 1.009. Chest radiograph with bilateral interstitial infiltrates at the lower zones, positive small bilateral pleural effusions. EKG 136 bpm, left axis deviation, left bundle branch block, measured QTC 400 ms, atrial fibrillation rhythm, noisy baseline, poor R wave progression, no significant ST segment changes, V5-V6 T wave inversion.  Patient was placed on diltiazem infusion and aggressive diuresis with furosemide.   Diltiazem was changed toamiodarone per cariology recommendations with good toleration.  She  has severehypervolemia, placed onfurosemide drip to target further negative fluid balance.(metolazone x2on 10/08-10/11)   Carrie Monroe  BSW:[id number] DOB: 12-Sep-1935 DOA: 03/02/2020 PCP: Marton Redwood, MD  Assessment & Plan:   Principal Problem:   Acute on chronic systolic CHF (congestive heart failure) ([place]) Active Problems:   CAD- severe 3V CAD    Chronic respiratory failure with hypoxia (HCC)   Atrial fibrillation with rapid ventricular response (HCC)   Secondary hypercoagulable state ([place])   AKI (acute kidney injury) ([place])   Thrombocytopenia ([place])   Pressure injury of skin   Acute on chronic systolic heart failure exacerbation: echo shows EF 40-45%, b/l atrial dilatation. Continue on lasix drip. Neg 2.4L today. Monitor I/Os. Continue on metoprolol, digoxin. Cardio recs apprec.  Intake/Output Summary (Last 24 hours) at 03/14/2020 1548 Last data filed at 03/14/2020 1351 Gross per 24 hour  Intake 598 ml  Output 1350 ml  Net -752 ml    I/O last 3 completed shifts: In: 446 [P.O.:446] Out: 1620 [Urine:1620] Total I/O In: 480 [P.O.:480] Out: 200 [Urine:200]   Patient seen today for follow-up, patient states that she is feeling a lot better her swelling has improved and agrees with discharge planning.  Discussed with patient want to continue her diuretic therapy at home along with her metoprolol.Greatly appreciate  cardiology consult.   Aortic Stenosis: s/p TAVR.Continue on telemetry.   Atrial fib: w/ GYK:[id number] on metoprolol, amiodarone, digoxin & eliquis.   AKI on CKDIIIa: Cr is trending up from day prior. Will continue to monitor  Lab Results  Component Value Date   CREATININE 2.22 (H) 03/14/2020   CREATININE 2.14 (H) 03/12/2020   CREATININE 1.71 (H) 03/11/2020  Avoid nephrotoxic agents and meds renally dose all meds.    Hypokalemia: WNL today. Will continue to monitor in light of intermittent and ongoing use of diuretic therapy.   Likely contraction  alkalosis: likely secondary to lasix use. Will continue to monitor,Today serum pco2 is 33.  Thrombocytopenia: etiology unclear. Continue eliquis. We will recheck cbc today..  CAD: w/ elevated troponins secondary to demand ischemia. Greatly appreciated cardiology management and care.   HLD: continue on statin ,crestor 10 mg.   Stage 2 sacrum pressure ulcer: present on admission.Continue w/ wound care.   Reactive hyperglycemia: HbA1c 5.4. Will continue to monitor.    DVT prophylaxis: eliquis  Code Status: full  Family Communication: Disposition Plan: PT/OT recs SNF   Status is: Inpatient  Remains inpatient appropriate because:Unsafe d/c plan and IV treatments appropriate due to intensity of illness or inability to take PO   Dispo: The patient is from: Home              Anticipated d/c is to: SNF              Anticipated d/c date is: 3 days              Patient currently is not medically stable to d/c.   Consultants:   cardio  Procedures:  None. Antimicrobials Anti-infectives (From admission, onward)   None      Subjective: Pt c/o malaise  03/13/2020: Pt seen today for f/u of her A.fib rvr with CHF exacerbation and worsening renal failure. Pt denies any complaints otherwise. 03/14/2020: Pt is doing better clinically.  Intake/Output Summary (Last 24 hours) at 03/14/2020 1551 Last data filed at 03/14/2020 1351 Gross per 24 hour  Intake 598 ml  Output 1350 ml  Net -752 ml   D/w her about PT eval prior to going home today.    Objective: Vitals:   03/14/20 0419 03/14/20 0457 03/14/20 0804 03/14/20 1206  BP: (!) 124/47  (!) 113/44 (!) 123/56  Pulse: 75  73 65  Resp: 20  20 20   Temp: (!) 97.2 F (36.2 C)  98 F (36.7 C) 97.6 F (36.4 C)  TempSrc: Oral  Oral Oral  SpO2: 95%  94% 95%  Weight:  94.3 kg    Height:        Intake/Output Summary (Last 24 hours) at 03/14/2020 1548 Last data filed at 03/14/2020 1351 Gross per 24 hour  Intake 598 ml    Output 1350 ml  Net -752 ml   Filed Weights   03/12/20 0609 03/13/20 0700 03/14/20 0457  Weight: 92.4 kg 93.8 kg 94.3 kg    Examination: General exam: Appears calm and comfortable  Respiratory system: diminished breath sounds b/l. No rales, rhonchi  Cardiovascular system: irregularly irregular. No rubs, gallops or clicks. Gastrointestinal system: Abdomen is nondistended, soft and nontender. Normal bowel sounds heard. Central nervous system: Alert and oriented. Moves all 4 extremities  Psychiatry: Judgement and insight appear normal. Flat mood and affect  Data Reviewed: I have personally reviewed following labs and imaging studies  CBC: Recent Labs  Lab 03/13/20 0836 03/14/20 0153  WBC 5.5 5.4  NEUTROABS  --  3.8  HGB 13.2 12.5  HCT 41.1 40.2  MCV 101.5* 101.5*  PLT 105* 578*   Basic Metabolic Panel: Recent Labs  Lab 03/08/20 0128 03/08/20 0128 03/09/20 0328 03/10/20 0124 03/11/20 0146 03/12/20 0057 03/14/20 0153  NA 139   < > 137 139 135 137 142  K 2.9*   < > 3.3* 3.4* 4.6 4.1 5.0  CL 90*   < > 87* 89* 90* 93* 102  CO2 37*   < > 41* 39* 36* 33* 33*  GLUCOSE 154*   < >  146* 133* 144* 133* 131*  BUN 65*   < > 66* 69* 66* 67* 67*  CREATININE 2.39*   < > 2.13* 1.81* 1.71* 2.14* 2.22*  CALCIUM 9.5   < > 9.4 9.7 9.3 9.3 9.6  MG 2.1  --   --  2.0  --  1.9  --    < > = values in this interval not displayed.   GFR: Estimated Creatinine Clearance: 21.3 mL/min (A) (by C-G formula based on SCr of 2.22 mg/dL (H)).  Radiology Studies: No results found.  cheduled Meds: . amiodarone  400 mg Oral BID  . apixaban  2.5 mg Oral BID  . [START ON 03/15/2020] furosemide  80 mg Oral Daily  . polyethylene glycol  17 g Oral BID  . [START ON 03/15/2020] potassium chloride  40 mEq Oral Daily  . rosuvastatin  10 mg Oral Daily  . sodium chloride flush  3 mL Intravenous Q12H   Continuous Infusions: . sodium chloride       LOS: 12 days   Time spent: 20 mins  Para Skeans,  MD [facility] Pager [phone]. If 7PM-7AM, please contact night-coverage [email or web address] 03/14/2020, 3:48 PM

## 2020-03-14 NOTE — Progress Notes (Addendum)
Physical Therapy Treatment Patient Details Name: Carrie Monroe MRN: [id number] DOB: 24-Apr-1936 Today's Date: 03/14/2020    History of Present Illness 84 y.o. female presenting with weight gain, worsening of chronic BLE edema, and progressive dyspnea. Patient admitted with acute on chronic systolic CHF. Previous admission 11/2019 with new onset afib w/ RVR and acute on chronic CHF. PMH includes: 3-vessel CAD s/p DES PCI (2018), severe AS s/p TAVR (2018), chronic HFpEF, HTN, HLD, DMII, and CKD stage IIIb.     PT Comments    Pt seated in recliner chair.  Pt required min assistance to min guard but continues to improve slowly.  Pt is home alone and based on continued weakness, she remains to require short term rehab to maximize functional gains before returning home.  Plan for continued strengthening during PT sessions as she is tolerating increased activity well.     Follow Up Recommendations  SNF;Supervision/Assistance - 24 hour     Equipment Recommendations  None recommended by PT    Recommendations for Other Services       Precautions / Restrictions Precautions Precautions: Fall Restrictions Weight Bearing Restrictions: No    Mobility  Bed Mobility               General bed mobility comments: Pt seated in recliner on arrival this session.  Pt required assistance to scoot posterior into recliner chair.  Transfers Overall transfer level: Needs assistance Equipment used: Rolling walker (2 wheeled) Transfers: Sit to/from Stand Sit to Stand: Supervision Stand pivot transfers: Supervision       General transfer comment: Cues for hand placement to and from seated surface.  Ambulation/Gait Ambulation/Gait assistance: Min guard Gait Distance (Feet): 85 Feet Assistive device: Rolling walker (2 wheeled) Gait Pattern/deviations: Step-to pattern;Wide base of support;Trunk flexed Gait velocity: reduced   General Gait Details: Cues for upper trunk control,  Pt  required cues for safety with RW.  No c/o dizziness this session.  Pt on RA this session, pt did desat to 87% but with seated rest break after gt quickly improves to 95% on RA.   Stairs             Wheelchair Mobility    Modified Rankin (Stroke Patients Only)       Balance Overall balance assessment: Needs assistance Sitting-balance support: No upper extremity supported;Feet supported Sitting balance-Leahy Scale: Good       Standing balance-Leahy Scale: Fair                              Cognition Arousal/Alertness: Awake/alert Behavior During Therapy: WFL for tasks assessed/performed Overall Cognitive Status: Within Functional Limits for tasks assessed Area of Impairment: Problem solving;Memory                     Memory: Decreased short-term memory       Problem Solving: Requires verbal cues General Comments: Pt required constant VCs throughout, redirection due to STM deficits.      Exercises General Exercises - Lower Extremity Ankle Circles/Pumps: AROM;Both;20 reps;Supine Quad Sets: AROM;Both;10 reps;Supine Long Arc Quad: AROM;Both;10 reps;Supine Heel Slides: AROM;Both;10 reps;Supine    General Comments        Pertinent Vitals/Pain Pain Assessment: 0-10 Pain Score: 6  Pain Location: L dorsal aspect of foot ( pitting edema in B feet ) L>R. Pain Descriptors / Indicators: Discomfort Pain Intervention(s): Monitored during session;Repositioned (elevated B LEs.)    Home Living  Prior Function            PT Goals (current goals can now be found in the care plan section) Acute Rehab PT Goals Patient Stated Goal: Patient is anxious to get back home. PT Goal Formulation: With patient Potential to Achieve Goals: Good Progress towards PT goals: Progressing toward goals    Frequency    Min 3X/week      PT Plan Current plan remains appropriate    Co-evaluation              AM-PAC PT "6  Clicks" Mobility   Outcome Measure  Help needed turning from your back to your side while in a flat bed without using bedrails?: A Little Help needed moving from lying on your back to sitting on the side of a flat bed without using bedrails?: A Little Help needed moving to and from a bed to a chair (including a wheelchair)?: None Help needed standing up from a chair using your arms (e.g., wheelchair or bedside chair)?: None Help needed to walk in hospital room?: A Little Help needed climbing 3-5 steps with a railing? : A Little 6 Click Score: 20    End of Session Equipment Utilized During Treatment: Gait belt Activity Tolerance: Patient tolerated treatment well Patient left: in chair;with call bell/phone within reach;with chair alarm set Nurse Communication: Mobility status (drop in SPO2.) PT Visit Diagnosis: Unsteadiness on feet (R26.81);Muscle weakness (generalized) (M62.81)     Time: 4917-9150 PT Time Calculation (min) (ACUTE ONLY): 18 min  Charges:  $Gait Training: 8-22 mins $Therapeutic Activity: 8-22 mins                     Erasmo Leventhal , PTA Acute Rehabilitation Services Pager [phone] Office [phone]     Emma Schupp Eli Hose 03/14/2020, 3:17 PM

## 2020-03-14 NOTE — Care Management Important Message (Signed)
Important Message  Patient Details  Name: Carrie Monroe MRN: [id number] Date of Birth: 1935/07/23   Medicare Important Message Given:  Yes     Shelda Altes 03/14/2020, 11:41 AM

## 2020-03-14 NOTE — TOC Initial Note (Signed)
Transition of Care [facility]) - Initial/Assessment Note    Patient Details  Name: Carrie Monroe MRN: [id number] Date of Birth: [birth year]  [facility]) CM/SW Contact:    Loreta Ave, [place] Phone Number: 03/14/2020, 4:39 PM  Clinical Narrative:                 CSW received consult for possible SNF placement at time of discharge. CSW spoke with patient's daughter in law Carrie Monroe via phone [phone] regarding PT recommendation of SNF placement at time of discharge. Patient lives alone and is unable to care for herself at her home given her current physical needs and fall risk. Patient's family expressed understanding of PT recommendation and is agreeable to SNF placement at time of discharge. CSW discussed insurance authorization process and provided Medicare SNF ratings list. Patient has not received the COVID vaccines and is not interested due to her religion. [name] expressed being hopeful for rehab and to feel better soon. No further questions reported at this time. CSW to continue to follow and assist with discharge planning needs.  Expected Discharge Plan: Skilled Nursing Facility Barriers to Discharge: Continued Medical Work up   Patient Goals and CMS Choice Patient states their goals for this hospitalization and ongoing recovery are:: return home and get stronger CMS Medicare.gov Compare Post Acute Care list provided to:: Patient Choice offered to / list presented to : Patient  Expected Discharge Plan and Services Expected Discharge Plan: [place]   Discharge Planning Services: CM Consult Post Acute Care Choice: [place] arrangements for the past 2 months: Single Family Home                   DME Agency: NA         HH Agency: [facility]        Prior Living Arrangements/Services Living arrangements for the past 2 months: Single Family Home Lives with:: Self Patient language and need for interpreter reviewed::  Yes        Need for Family Participation in Patient Care: Yes (Comment) Care giver support system in place?: Yes (comment) Current home services: DME Criminal Activity/Legal Involvement Pertinent to Current Situation/Hospitalization: No - Comment as needed  Activities of Daily Living Home Assistive Devices/Equipment: Oxygen, Cane (specify quad or straight), Eyeglasses, Dentures (specify type) (straight; shower chair; partials) ADL Screening (condition at time of admission) Patient's cognitive ability adequate to safely complete daily activities?: Yes Is the patient deaf or have difficulty hearing?: No Does the patient have difficulty seeing, even when wearing glasses/contacts?: No Does the patient have difficulty concentrating, remembering, or making decisions?: No Patient able to express need for assistance with ADLs?: Yes Does the patient have difficulty dressing or bathing?: Yes Independently performs ADLs?: No Communication: Independent Dressing (OT): Needs assistance Is this a change from baseline?: Change from baseline, expected to last >3 days Grooming: Independent Feeding: Independent Bathing: Needs assistance Is this a change from baseline?: Change from baseline, expected to last >3 days Toileting: Needs assistance Is this a change from baseline?: Change from baseline, expected to last >3days In/Out Bed: Needs assistance Is this a change from baseline?: Change from baseline, expected to last >3 days Walks in Home: Needs assistance Is this a change from baseline?: Change from baseline, expected to last >3 days Does the patient have difficulty walking or climbing stairs?: Yes Weakness of Legs: Both Weakness of Arms/Hands: None  Permission Sought/Granted Permission sought to share information with : Facility  Contact Representative Permission granted to share information with : Yes, Verbal Permission Granted     Permission granted to share info w AGENCY: [facility]         Emotional Assessment Appearance:: Appears stated age Attitude/Demeanor/Rapport: Engaged Affect (typically observed): Appropriate Orientation: : Oriented to Self, Oriented to Place, Oriented to  Time, Oriented to Situation Alcohol / Substance Use: Not Applicable Psych Involvement: No (comment)  Admission diagnosis:  Atrial fibrillation with rapid ventricular response (HCC) [I48.91] AKI (acute kidney injury) ([place]) [N17.9] Acute on chronic systolic CHF (congestive heart failure) (HCC) [I50.23] Acute on chronic combined systolic and diastolic CHF (congestive heart failure) ([place]) [I50.43] Patient Active Problem List   Diagnosis Date Noted  . Pressure injury of skin 03/03/2020  . Acute on chronic systolic CHF (congestive heart failure) ([place]) 03/02/2020  . AKI (acute kidney injury) ([place]) 03/02/2020  . Thrombocytopenia ([place]) 03/02/2020  . Secondary hypercoagulable state ([place]) 01/13/2020  . Persistent atrial fibrillation ([place])   . Hypoxia   . Acute on chronic diastolic CHF (congestive heart failure), NYHA class 3 ([place]) 11/21/2019  . Atrial fibrillation with rapid ventricular response ([place]) 11/21/2019  . Fever   . Carotid stenosis 03/13/2019  . Encounter for medication monitoring 11/27/2017  . PICC (peripherally inserted central catheter) in place 11/27/2017  . MSSA bacteremia 11/05/2017  . Wound infection after surgery 11/04/2017  . Anemia 09/27/2017  . Hyperlipidemia   . History of kidney stones   . Hemorrhoids   . Exogenous obesity   . Chronic diastolic CHF (congestive heart failure) ([place])   . Arthritis   . Aortic stenosis, severe   . Chronic respiratory failure with hypoxia ([place])   . Hemorrhagic shock ([place])   . 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 Carotid artery stenosis   . Three-vessel CAD-PCI to LAD and LCx, med management of RCA   . Chronic renal insufficiency, stage III (moderate) ([place]) 12/22/2016  . Dental abscess- s/p multiple tooth  extraction 12/21/16 12/22/2016  . CAD- severe 3V CAD    . Essential hypertension 01/27/2016  . Morbid obesity due to excess calories ([place]) 01/27/2016  . Gout 06/22/2013  . Dyslipidemia 05/08/2011  . Severe aortic stenosis 05/08/2011  . Breast cancer of upper-outer quadrant of left female breast ([place]) 03/16/2011   PCP:  Marton Redwood, MD Pharmacy:   [facility], [address] [place] [address] Phone: [phone] Fax: [phone]  [facility] ORDER) [place] [place] [address] Phone: [phone] Fax: [phone]  [facility] [place], [place] - [address] [place] [address] Phone: [phone] Fax: [phone]  [id number] [address], [address] [place] [address] Phone: [phone] Fax: [phone]     Social Determinants of Health (SDOH) Interventions    Readmission Risk Interventions No flowsheet data found.

## 2020-03-14 NOTE — Progress Notes (Addendum)
Mobility Specialist - Progress Note   03/14/20 1544  Mobility  Activity Ambulated in room  Level of Assistance Modified independent, requires aide device or extra time  Distance Ambulated (ft) 20 ft  Mobility Response Tolerated well  Mobility performed by Mobility specialist  $Mobility charge 1 Mobility    Pre-mobility: 72 HR, 95% SpO2 Post-mobility: 75 HR, 97% SpO2  Pt asx during ambulation. She didn't want to ambulate further than in the room as she stated she had just recently ambulated. Pt assisted into bed afterwards.  Pricilla Handler Mobility Specialist Mobility Specialist Phone: [phone]

## 2020-03-14 NOTE — Progress Notes (Signed)
Progress Note  Patient Name: Carrie Monroe Date of Encounter: 03/14/2020  [facility] Cardiologist: Sanda Klein, MD   Subjective   Still feels dizzy despite good AFib rate control. DBP often<50. Weight 208 lb, up 4 lb from minimum weight on 10/16, down 34 lb from admission. She is a little dry over last 3 days.  Inpatient Medications    Scheduled Meds: . amiodarone  400 mg Oral BID  . apixaban  2.5 mg Oral BID  . polyethylene glycol  17 g Oral BID  . potassium chloride  40 mEq Oral BID  . rosuvastatin  10 mg Oral Daily  . sodium chloride flush  3 mL Intravenous Q12H   Continuous Infusions: . sodium chloride     PRN Meds: sodium chloride, acetaminophen, docusate sodium, ondansetron (ZOFRAN) IV, sodium chloride flush   Vital Signs    Vitals:   03/13/20 2315 03/14/20 0419 03/14/20 0457 03/14/20 0804  BP: (!) 113/51 (!) 124/47  (!) 113/44  Pulse: 71 75  73  Resp: 19 20  20   Temp: 98.5 F (36.9 C) (!) 97.2 F (36.2 C)  98 F (36.7 C)  TempSrc: Oral Oral  Oral  SpO2: 96% 95%  94%  Weight:   94.3 kg   Height:        Intake/Output Summary (Last 24 hours) at 03/14/2020 1111 Last data filed at 03/14/2020 0430 Gross per 24 hour  Intake 328 ml  Output 1150 ml  Net -822 ml   Last 3 Weights 03/14/2020 03/13/2020 03/12/2020  Weight (lbs) 207 lb 14.3 oz 206 lb 14.4 oz 203 lb 9.6 oz  Weight (kg) 94.3 kg 93.849 kg 92.352 kg      Telemetry    AFib, rate controlled - Personally Reviewed  ECG    No new tracing - Personally Reviewed  Physical Exam  Obese, appears tired, but smiling GEN: No acute distress.   Neck: No JVD Cardiac: irregular, no murmurs, rubs, or gallops.  Respiratory: Clear to auscultation bilaterally. GI: Soft, nontender, non-distended  MS: 1+ symmetrical ankle edema; No deformity. Neuro:  Nonfocal  Psych: Normal affect   Labs    High Sensitivity Troponin:   Recent Labs  Lab 03/02/20 1955 03/03/20 0327  TROPONINIHS 54* 52*        Chemistry Recent Labs  Lab 03/11/20 0146 03/12/20 0057 03/14/20 0153  NA 135 137 142  K 4.6 4.1 5.0  CL 90* 93* 102  CO2 36* 33* 33*  GLUCOSE 144* 133* 131*  BUN 66* 67* 67*  CREATININE 1.71* 2.14* 2.22*  CALCIUM 9.3 9.3 9.6  GFRNONAA 27* 21* 20*  ANIONGAP 9 11 7      Hematology Recent Labs  Lab 03/13/20 0836 03/14/20 0153  WBC 5.5 5.4  RBC 4.05 3.96  HGB 13.2 12.5  HCT 41.1 40.2  MCV 101.5* 101.5*  MCH 32.6 31.6  MCHC 32.1 31.1  RDW 16.1* 16.1*  PLT 105* 103*    BNPNo results for input(s): BNP, PROBNP in the last 168 hours.   DDimer No results for input(s): DDIMER in the last 168 hours.   Radiology    No results found.  Cardiac Studies   Echo 11/22/2019  1. Left ventricular ejection fraction, by estimation, is 40 to 45%. The  left ventricle has mildly decreased function. The left ventricle  demonstrates global hypokinesis. There is mild concentric left ventricular  hypertrophy. Left ventricular diastolic  function could not be evaluated.  2. Right ventricular systolic function is mildly reduced.  The right  ventricular size is mildly enlarged. There is moderately elevated  pulmonary artery systolic pressure. The estimated right ventricular  systolic pressure is 16.1 mmHg.  3. Left atrial size was severely dilated.  4. Right atrial size was severely dilated.  5. The mitral valve is normal in structure. Mild to moderate mitral valve  regurgitation.  6. The aortic valve has been repaired/replaced. Aortic valve  regurgitation is not visualized. There is a 26 mm Medtronic  CoreValve-EvolutR prosthetic (TAVR) valve present in the aortic position.  Procedure Date: September 2018. Echo findings are  consistent with normal structure and function of the aortic valve  prosthesis.  7. The inferior vena cava is dilated in size with <50% respiratory  variability, suggesting right atrial pressure of 15 mmHg.   Comparison(s): Changes from prior study  are noted. The left ventricular  function is worsened.   Patient Profile     84 y.o. female admitted to the hospital for decompensated heart failure and atrial fibrillation with rapid ventricular response.  History of severe AS status post TAVR 01/2017 with 26 mm Medtronic CoreValve evolute pro, CAD with multivessel PCI in 2018, heart failure with reduced ejection fraction EF 40 to 45%, persistent atrial fibrillation on Eliquis, breast cancer status post lumpectomy and history of radiation, DM2, hypertension, hyperlipidemia.  Assessment & Plan    1. Atrial Fibrillation:rate controlled since amiodarone started.Will stop digoxin and beta blocker in anticipation of bradycardia after [facility], scheduled for tomorrow. This procedure has been fully reviewed with the patient and written informed consent has been obtained.She has severe biatrial dilation and her odds of long-term maintenance of sinus rhythm remain poor. Nota good candidate for dofetilide due to her long baseline QT interval and abnormal renal function. Not a candidate for flecainide due to underlying CAD.Multaq contraindicated due to CHF. Compliant with anticoagulation without bleeding complications. CHA2DS2-VASc 5(age 59, gender, CHF, CAD).  2. Acute onChronic Systolic Heart Failure:previously estimated "dry weight" of approx 216 lb, now substantially less than that and appears "dry" clinically and by labs. Will set a target weight of 212 lb or higher for initiation of extra diuretic therapy. Plan furosemide 80 mg daily at DC as initial dose. Minimal worsening noted in EF from prior TTE in 01/2019 (EF 45-50% -->EF 40-45%). 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  3. CAD:S/p DES of the LAD and RCA in 2018.No angina, even with RVR.  4. HLP:On statin, with most recent LDL cholesterol 67.  5. S/P TAVR:normal prosthesis  function by recent echo.   6.Acute onCKD 3b:Creat above baseline after diuresis. No diuretic today. Creatinine has been 1.36-1.71 over the last few months.  7. Chronic thrombocytopenia:stable around 100K. No bleeding.  8. Diastolic hypotension: stop metoprolol. May be reason for her dizziness. May need midodrine in the future.     Scheduled for DCCV at 0930h tomorrow. Whether the CV succeeds or not, will probably be ready for DC barring unexpected complications.  For questions or updates, please contact [place] Please consult [email or web address] for contact info under        Signed, Sanda Klein, MD  03/14/2020, 11:11 AM

## 2020-03-14 NOTE — H&P (View-Only) (Signed)
Progress Note  Patient Name: Carrie Monroe Date of Encounter: 03/14/2020  [facility] Cardiologist: Sanda Klein, MD   Subjective   Still feels dizzy despite good AFib rate control. DBP often<50. Weight 208 lb, up 4 lb from minimum weight on 10/16, down 34 lb from admission. She is a little dry over last 3 days.  Inpatient Medications    Scheduled Meds: . amiodarone  400 mg Oral BID  . apixaban  2.5 mg Oral BID  . polyethylene glycol  17 g Oral BID  . potassium chloride  40 mEq Oral BID  . rosuvastatin  10 mg Oral Daily  . sodium chloride flush  3 mL Intravenous Q12H   Continuous Infusions: . sodium chloride     PRN Meds: sodium chloride, acetaminophen, docusate sodium, ondansetron (ZOFRAN) IV, sodium chloride flush   Vital Signs    Vitals:   03/13/20 2315 03/14/20 0419 03/14/20 0457 03/14/20 0804  BP: (!) 113/51 (!) 124/47  (!) 113/44  Pulse: 71 75  73  Resp: 19 20  20   Temp: 98.5 F (36.9 C) (!) 97.2 F (36.2 C)  98 F (36.7 C)  TempSrc: Oral Oral  Oral  SpO2: 96% 95%  94%  Weight:   94.3 kg   Height:        Intake/Output Summary (Last 24 hours) at 03/14/2020 1111 Last data filed at 03/14/2020 0430 Gross per 24 hour  Intake 328 ml  Output 1150 ml  Net -822 ml   Last 3 Weights 03/14/2020 03/13/2020 03/12/2020  Weight (lbs) 207 lb 14.3 oz 206 lb 14.4 oz 203 lb 9.6 oz  Weight (kg) 94.3 kg 93.849 kg 92.352 kg      Telemetry    AFib, rate controlled - Personally Reviewed  ECG    No new tracing - Personally Reviewed  Physical Exam  Obese, appears tired, but smiling GEN: No acute distress.   Neck: No JVD Cardiac: irregular, no murmurs, rubs, or gallops.  Respiratory: Clear to auscultation bilaterally. GI: Soft, nontender, non-distended  MS: 1+ symmetrical ankle edema; No deformity. Neuro:  Nonfocal  Psych: Normal affect   Labs    High Sensitivity Troponin:   Recent Labs  Lab 03/02/20 1955 03/03/20 0327  TROPONINIHS 54* 52*        Chemistry Recent Labs  Lab 03/11/20 0146 03/12/20 0057 03/14/20 0153  NA 135 137 142  K 4.6 4.1 5.0  CL 90* 93* 102  CO2 36* 33* 33*  GLUCOSE 144* 133* 131*  BUN 66* 67* 67*  CREATININE 1.71* 2.14* 2.22*  CALCIUM 9.3 9.3 9.6  GFRNONAA 27* 21* 20*  ANIONGAP 9 11 7      Hematology Recent Labs  Lab 03/13/20 0836 03/14/20 0153  WBC 5.5 5.4  RBC 4.05 3.96  HGB 13.2 12.5  HCT 41.1 40.2  MCV 101.5* 101.5*  MCH 32.6 31.6  MCHC 32.1 31.1  RDW 16.1* 16.1*  PLT 105* 103*    BNPNo results for input(s): BNP, PROBNP in the last 168 hours.   DDimer No results for input(s): DDIMER in the last 168 hours.   Radiology    No results found.  Cardiac Studies   Echo 11/22/2019  1. Left ventricular ejection fraction, by estimation, is 40 to 45%. The  left ventricle has mildly decreased function. The left ventricle  demonstrates global hypokinesis. There is mild concentric left ventricular  hypertrophy. Left ventricular diastolic  function could not be evaluated.  2. Right ventricular systolic function is mildly reduced.  The right  ventricular size is mildly enlarged. There is moderately elevated  pulmonary artery systolic pressure. The estimated right ventricular  systolic pressure is 12.1 mmHg.  3. Left atrial size was severely dilated.  4. Right atrial size was severely dilated.  5. The mitral valve is normal in structure. Mild to moderate mitral valve  regurgitation.  6. The aortic valve has been repaired/replaced. Aortic valve  regurgitation is not visualized. There is a 26 mm Medtronic  CoreValve-EvolutR prosthetic (TAVR) valve present in the aortic position.  Procedure Date: September 2018. Echo findings are  consistent with normal structure and function of the aortic valve  prosthesis.  7. The inferior vena cava is dilated in size with <50% respiratory  variability, suggesting right atrial pressure of 15 mmHg.   Comparison(s): Changes from prior study  are noted. The left ventricular  function is worsened.   Patient Profile     84 y.o. female admitted to the hospital for decompensated heart failure and atrial fibrillation with rapid ventricular response.  History of severe AS status post TAVR 01/2017 with 26 mm Medtronic CoreValve evolute pro, CAD with multivessel PCI in 2018, heart failure with reduced ejection fraction EF 40 to 45%, persistent atrial fibrillation on Eliquis, breast cancer status post lumpectomy and history of radiation, DM2, hypertension, hyperlipidemia.  Assessment & Plan    1. Atrial Fibrillation:rate controlled since amiodarone started.Will stop digoxin and beta blocker in anticipation of bradycardia after [facility], scheduled for tomorrow. This procedure has been fully reviewed with the patient and written informed consent has been obtained.She has severe biatrial dilation and her odds of long-term maintenance of sinus rhythm remain poor. Nota good candidate for dofetilide due to her long baseline QT interval and abnormal renal function. Not a candidate for flecainide due to underlying CAD.Multaq contraindicated due to CHF. Compliant with anticoagulation without bleeding complications. CHA2DS2-VASc 5(age 64, gender, CHF, CAD).  2. Acute onChronic Systolic Heart Failure:previously estimated "dry weight" of approx 216 lb, now substantially less than that and appears "dry" clinically and by labs. Will set a target weight of 212 lb or higher for initiation of extra diuretic therapy. Plan furosemide 80 mg daily at DC as initial dose. Minimal worsening noted in EF from prior TTE in 01/2019 (EF 45-50% -->EF 40-45%). 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  3. CAD:S/p DES of the LAD and RCA in 2018.No angina, even with RVR.  4. HLP:On statin, with most recent LDL cholesterol 67.  5. S/P TAVR:normal prosthesis  function by recent echo.   6.Acute onCKD 3b:Creat above baseline after diuresis. No diuretic today. Creatinine has been 1.36-1.71 over the last few months.  7. Chronic thrombocytopenia:stable around 100K. No bleeding.  8. Diastolic hypotension: stop metoprolol. May be reason for her dizziness. May need midodrine in the future.     Scheduled for DCCV at 0930h tomorrow. Whether the CV succeeds or not, will probably be ready for DC barring unexpected complications.  For questions or updates, please contact [place] Please consult [email or web address] for contact info under        Signed, Sanda Klein, MD  03/14/2020, 11:11 AM

## 2020-03-15 ENCOUNTER — Inpatient Hospital Stay (HOSPITAL_COMMUNITY): Payer: Medicare Other | Admitting: Certified Registered"

## 2020-03-15 ENCOUNTER — Encounter (HOSPITAL_COMMUNITY)
Admission: EM | Disposition: A | Discharge status: Home Health | Payer: Self-pay | Source: Home / Self Care | Attending: Internal Medicine

## 2020-03-15 ENCOUNTER — Encounter (HOSPITAL_COMMUNITY): Payer: Self-pay | Admitting: Family Medicine

## 2020-03-15 DIAGNOSIS — I5023 Acute on chronic systolic (congestive) heart failure: Secondary | ICD-10-CM | POA: Diagnosis not present

## 2020-03-15 DIAGNOSIS — I4819 Other persistent atrial fibrillation: Secondary | ICD-10-CM | POA: Diagnosis not present

## 2020-03-15 HISTORY — PX: CARDIOVERSION: SHX1299

## 2020-03-15 SURGERY — CARDIOVERSION
Anesthesia: General

## 2020-03-15 MED ORDER — LIDOCAINE 2% (20 MG/ML) 5 ML SYRINGE
INTRAMUSCULAR | Status: DC | PRN
  Administered 2020-03-15: 40 mg via INTRAVENOUS

## 2020-03-15 MED ORDER — AMIODARONE HCL 200 MG PO TABS
200.0000 mg | ORAL_TABLET | Freq: Every day | ORAL | Status: DC
  Administered 2020-03-16 – 2020-03-18 (×3): 200 mg via ORAL
  Filled 2020-03-15 (×3): qty 1

## 2020-03-15 MED ORDER — PROPOFOL 10 MG/ML IV BOLUS
INTRAVENOUS | Status: DC | PRN
  Administered 2020-03-15: 50 mg via INTRAVENOUS

## 2020-03-15 NOTE — Progress Notes (Signed)
Progress Note  Patient Name: Carrie Monroe Date of Encounter: 03/15/2020  [facility] Cardiologist: Sanda Klein, MD   Subjective   No events overnight. Still dizzy ( a longstanding complaint). In/out matched (not sure a complete collection). Weight unchanged at 208 lb. Creatinine unchanged at 2.22.  After cardioversion emerging rate was sinus bradycardia 49 bpm (beta blockers and digoxin have been stopped).  Inpatient Medications    Scheduled Meds: . [MAR Hold] amiodarone  400 mg Oral BID  . [MAR Hold] apixaban  2.5 mg Oral BID  . [MAR Hold] furosemide  80 mg Oral Daily  . [MAR Hold] polyethylene glycol  17 g Oral BID  . [MAR Hold] potassium chloride  40 mEq Oral Daily  . [MAR Hold] rosuvastatin  10 mg Oral Daily  . [MAR Hold] sodium chloride flush  3 mL Intravenous Q12H   Continuous Infusions: . [MAR Hold] sodium chloride     PRN Meds: [MAR Hold] sodium chloride, [MAR Hold] acetaminophen, [MAR Hold] docusate sodium, [MAR Hold] ondansetron (ZOFRAN) IV, [MAR Hold] sodium chloride flush   Vital Signs    Vitals:   03/15/20 0433 03/15/20 0614 03/15/20 0741 03/15/20 0908  BP: 137/68  (!) 148/73 140/68  Pulse: 75  77 82  Resp: 18 18 20  (!) 22  Temp: 98.2 F (36.8 C)  97.6 F (36.4 C) 98.5 F (36.9 C)  TempSrc: Oral  Oral Oral  SpO2: 94%  94% 96%  Weight:  94.4 kg    Height:        Intake/Output Summary (Last 24 hours) at 03/15/2020 0940 Last data filed at 03/15/2020 0746 Gross per 24 hour  Intake 240 ml  Output 980 ml  Net -740 ml   Last 3 Weights 03/15/2020 03/14/2020 03/13/2020  Weight (lbs) 208 lb 1.6 oz 207 lb 14.3 oz 206 lb 14.4 oz  Weight (kg) 94.394 kg 94.3 kg 93.849 kg      Telemetry    AFib with controlled rate - Personally Reviewed  ECG    Sinus bradycardia with PACs (after DCCV) - Personally Reviewed  Physical Exam  Obese GEN: No acute distress.   Neck: No JVD Cardiac: RRR (after CV), no murmurs, rubs, or gallops.    Respiratory: Clear to auscultation bilaterally. GI: Soft, nontender, non-distended  MS: 1+ edema; No deformity. Neuro:  Nonfocal  Psych: Normal affect   Labs    High Sensitivity Troponin:   Recent Labs  Lab 03/02/20 1955 03/03/20 0327  TROPONINIHS 54* 52*      Chemistry Recent Labs  Lab 03/11/20 0146 03/12/20 0057 03/14/20 0153  NA 135 137 142  K 4.6 4.1 5.0  CL 90* 93* 102  CO2 36* 33* 33*  GLUCOSE 144* 133* 131*  BUN 66* 67* 67*  CREATININE 1.71* 2.14* 2.22*  CALCIUM 9.3 9.3 9.6  GFRNONAA 27* 21* 20*  ANIONGAP 9 11 7      Hematology Recent Labs  Lab 03/13/20 0836 03/14/20 0153  WBC 5.5 5.4  RBC 4.05 3.96  HGB 13.2 12.5  HCT 41.1 40.2  MCV 101.5* 101.5*  MCH 32.6 31.6  MCHC 32.1 31.1  RDW 16.1* 16.1*  PLT 105* 103*    BNPNo results for input(s): BNP, PROBNP in the last 168 hours.   DDimer No results for input(s): DDIMER in the last 168 hours.   Radiology    No results found.  Cardiac Studies   Echo 11/22/2019  1. Left ventricular ejection fraction, by estimation, is 40 to 45%. The  left ventricle has mildly decreased function. The left ventricle  demonstrates global hypokinesis. There is mild concentric left ventricular  hypertrophy. Left ventricular diastolic  function could not be evaluated.  2. Right ventricular systolic function is mildly reduced. The right  ventricular size is mildly enlarged. There is moderately elevated  pulmonary artery systolic pressure. The estimated right ventricular  systolic pressure is 40.1 mmHg.  3. Left atrial size was severely dilated.  4. Right atrial size was severely dilated.  5. The mitral valve is normal in structure. Mild to moderate mitral valve  regurgitation.  6. The aortic valve has been repaired/replaced. Aortic valve  regurgitation is not visualized. There is a 26 mm Medtronic  CoreValve-EvolutR prosthetic (TAVR) valve present in the aortic position.  Procedure Date: September 2018.  Echo findings are  consistent with normal structure and function of the aortic valve  prosthesis.  7. The inferior vena cava is dilated in size with <50% respiratory  variability, suggesting right atrial pressure of 15 mmHg.   Comparison(s): Changes from prior study are noted. The left ventricular  function is worsened.    Patient Profile     84 y.o. female admitted to the hospital for decompensated heart failure and atrial fibrillation with rapid ventricular response. History of severe AS status post TAVR 01/2017 with 26 mm Medtronic CoreValve evolute pro, CAD with multivessel PCI in 2018, heart failure with reduced ejection fraction EF 40 to 45%, persistent atrial fibrillation on Eliquis, breast cancer status post lumpectomy and history of radiation, DM2, hypertension, hyperlipidemia.   Assessment & Plan    1. Atrial Fibrillation:sinus bradycardia after DCCV. Reduce amiodarone to 200 mg daily. Metoprolol and digoxin stopped yesterday. She has severe biatrial dilation and her odds of long-term maintenance of sinus rhythm remain low.CHA2DS2-VASc 5(age 84, gender, CHF, CAD).  2. Acute onChronic Systolic Heart Failure:previously estimated "dry weight" of approx 216 lb, now substantially less than that and appears "dry" clinically and by labs.Will set a target weight of 212 lb or higher for initiation of extra diuretic therapy. Plan furosemide 80 mg daily at DC as initial dose.Minimal worsening noted in EF from prior TTE in 01/2019 (EF 45-50% -->EF 40-45%). 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  3. CAD:S/p DES of the LAD and RCA in 2018.No angina, even with RVR.  4. HLP:On statin, with most recent LDL cholesterol 67.  5. S/P TAVR:normal prosthesis function by recent echo.   6.Acute onCKD 3b:Creat above baseline after diuresis, still 2.22 today. No diuretic  today. Creatinine has been 1.36-1.71 over the last few months. Suspect she will need furosemide 80 mg daily, but wait until weight 212 lb and creat back to baseline.  7. Chronic thrombocytopenia:stable around 100K. No bleeding.  8. Diastolic hypotension: stop metoprolol. May be reason for her dizziness. May need midodrine in the future.     For questions or updates, please contact [place] Please consult [email or web address] for contact info under        Signed, Sanda Klein, MD  03/15/2020, 9:40 AM

## 2020-03-15 NOTE — TOC Progression Note (Signed)
Transition of Care [facility]) - Progression Note    Patient Details  Name: Carrie Monroe MRN: [id number] Date of Birth: [birth year]  Transition of Care [facility]) CM/SW [place], [place] Phone Number: 03/15/2020, 5:22 PM  Clinical Narrative:     CSW visit with patient at bedside. CSW introduced self and explained role. CSW discussed with patient the SNF process and the14 day quarantine period at SNF  If unvaccinated. Patient states she does not want to go to SNF if she unable to see family or have visitors for 14 days. "I feel like I am being bullied to take the vaccine and I don't want to". CSW acknowledge her feelings and explained it was her right not to take the vaccine but the 14 day quarantine period is to keep her safe and other residents at [facility]. Patient states understanding but advised she will see if she can get someone to stay with her while she is recuperating. She states her daughter will be here Sunday and could possibly stay with her.  CSW encourage patient to discuss with her concerns and thoughts with her daughter and daughter in law [place]. CSW will follow up with patient and family for disposition. Patient was agreeable to CSW calling her daughter in law,Stephanie.   CSW spoke with [name]- update on the patient's concerns and provided bed offers. She states she will discuss disposition with patient.  CSW will continue to follow and assist with discharge planning.  Thurmond Butts, MSW, [place] Clinical Social Worker     Expected Discharge Plan: Skilled Nursing Facility Barriers to Discharge: Continued Medical Work up  Expected Discharge Plan and Services Expected Discharge Plan: [facility]   Discharge Planning Services: CM Consult Post Acute Care Choice: [place] arrangements for the past 2 months: Single Family Home                   DME Agency: NA         HH Agency: [facility]         Social Determinants  of Health (SDOH) Interventions    Readmission Risk Interventions No flowsheet data found.

## 2020-03-15 NOTE — Progress Notes (Signed)
Occupational Therapy Treatment Patient Details Name: Carrie Monroe MRN: [id number] DOB: 03/17/1936 Today's Date: 03/15/2020    History of present illness 84 y.o. female presenting with weight gain, worsening of chronic BLE edema, and progressive dyspnea. Patient admitted with acute on chronic systolic CHF. Previous admission 11/2019 with new onset afib w/ RVR and acute on chronic CHF. PMH includes: 3-vessel CAD s/p DES PCI (2018), severe AS s/p TAVR (2018), chronic HFpEF, HTN, HLD, DMII, and CKD stage IIIb.    OT comments  OT treatment session with focus on self-care re-education, functional transfers, activity tolerance, and bed mobility. Patient seated in recliner upon entry with desire to return to bed. Patient declines use of DME for short distance ambulation and functional transfers this date. Patient continues to demonstrate decreased safety awareness, decreased activity tolerance, and generalized deconditioning. Patient would benefit from continued acute OT services to maximize safety and independence with self-care tasks, functional transfers, and mobility prior to d/c to next level of care. Given patient's CLOF and decrease family support, recommendation for SNF rehab.    Follow Up Recommendations  Supervision/Assistance - 24 hour;SNF    Equipment Recommendations  None recommended by OT    Recommendations for Other Services      Precautions / Restrictions Precautions Precautions: Fall Restrictions Weight Bearing Restrictions: No       Mobility Bed Mobility Overal bed mobility: Needs Assistance Bed Mobility: Sit to Supine       Sit to supine: Min assist   General bed mobility comments: Min A and increased time/effort for return to supine. Patient able to advance LLE from EOB to bed level but required assist to bring RUE to bed surface. Patient to negotiate hips to middle of bed with minimal assistance required.   Transfers Overall transfer level: Needs  assistance Equipment used:  (Patient declined use of RW for short-distance mobility.) Transfers: Sit to/from Stand Sit to Stand: Min guard;Supervision Stand pivot transfers: Supervision;Min guard       General transfer comment: Supervision for functional transfers with DME. Min guard to Min A without.     Balance     Sitting balance-Leahy Scale: Good       Standing balance-Leahy Scale: Fair Standing balance comment: Patient declined DME but reaching out for surfaces to hold with short-distance mobility in room.                            ADL either performed or assessed with clinical judgement   ADL Overall ADL's : Needs assistance/impaired     Grooming: Wash/dry hands;Wash/dry face;Oral care;Brushing hair;Set up;Sitting Grooming Details (indicate cue type and reason): Patient completed 2/3 grooming tasks seated EOB 2/2 fatigue.                      Toileting- Clothing Manipulation and Hygiene: Minimal assistance;Sit to/from stand;Sitting/lateral lean Toileting - Clothing Manipulation Details (indicate cue type and reason): Patient requires Min A for hygiene 2/2 body habitus and inabiility to reach buttocks.      Functional mobility during ADLs: Min guard;Rolling walker General ADL Comments: Min guard for short distance ambulation in room declining DME.      Vision       Perception     Praxis      Cognition Arousal/Alertness: Awake/alert Behavior During Therapy: WFL for tasks assessed/performed Overall Cognitive Status: Within Functional Limits for tasks assessed  Exercises     Shoulder Instructions       General Comments Mepilex boarder on buttocks and over R inferior boarder of scapula.    Pertinent Vitals/ Pain       Pain Assessment: No/denies pain Pain Intervention(s): Monitored during session  Home Living                                          Prior  Functioning/Environment              Frequency  Min 2X/week        Progress Toward Goals  OT Goals(current goals can now be found in the care plan section)  Progress towards OT goals: Progressing toward goals  Acute Rehab OT Goals Patient Stated Goal: Patient is anxious to get back home. OT Goal Formulation: With patient Time For Goal Achievement: 03/20/20 Potential to Achieve Goals: Good ADL Goals Pt Will Perform Grooming: with modified independence;standing Pt Will Perform Upper Body Dressing: with modified independence;sitting Pt Will Perform Lower Body Dressing: with modified independence;sit to/from stand;sitting/lateral leans;with adaptive equipment Pt Will Transfer to Toilet: with modified independence;ambulating Pt Will Perform Toileting - Clothing Manipulation and hygiene: with modified independence;sitting/lateral leans;sit to/from stand Pt Will Perform Tub/Shower Transfer: Shower transfer;shower seat;rolling walker Pt/caregiver will Perform Home Exercise Program: Increased ROM;Increased strength;Both right and left upper extremity;With written HEP provided  Plan Discharge plan remains appropriate;Frequency needs to be updated    Co-evaluation                 AM-PAC OT "6 Clicks" Daily Activity     Outcome Measure   Help from another person eating meals?: None Help from another person taking care of personal grooming?: A Little Help from another person toileting, which includes using toliet, bedpan, or urinal?: A Little Help from another person bathing (including washing, rinsing, drying)?: A Lot Help from another person to put on and taking off regular upper body clothing?: A Little Help from another person to put on and taking off regular lower body clothing?: A Lot 6 Click Score: 17    End of Session Equipment Utilized During Treatment: Gait belt  OT Visit Diagnosis: Unsteadiness on feet (R26.81);Muscle weakness (generalized) (M62.81)   Activity  Tolerance Patient tolerated treatment well   Patient Left in bed;with call bell/phone within reach;with bed alarm set   Nurse Communication          Time: 2878-6767 OT Time Calculation (min): 15 min  Charges: OT General Charges $OT Visit: 1 Visit OT Treatments $Self Care/Home Management : 8-22 mins  Aalijah Mims H. OTR/L Supplemental OT, Department of rehab services [phone]   Julianne Chamberlin R H. 03/15/2020, 3:13 PM

## 2020-03-15 NOTE — Anesthesia Preprocedure Evaluation (Signed)
Anesthesia Evaluation  Patient identified by MRN, date of birth, ID band Patient awake    Reviewed: Allergy & Precautions, H&P , NPO status , Patient's Chart, lab work & pertinent test results  Airway Mallampati: II   Neck ROM: full    Dental   Pulmonary neg pulmonary ROS,    breath sounds clear to auscultation       Cardiovascular hypertension, + angina + CAD, + Cardiac Stents and +CHF  + Valvular Problems/Murmurs  Rhythm:irregular Rate:Normal  S/p TAVR   Neuro/Psych    GI/Hepatic   Endo/Other  diabetes, Type 2  Renal/GU Renal InsufficiencyRenal disease     Musculoskeletal  (+) Arthritis ,   Abdominal   Peds  Hematology   Anesthesia Other Findings   Reproductive/Obstetrics                             Anesthesia Physical Anesthesia Plan  ASA: IV  Anesthesia Plan: General   Post-op Pain Management:    Induction: Intravenous  PONV Risk Score and Plan: 3 and Propofol infusion and Treatment may vary due to age or medical condition  Airway Management Planned: Mask  Additional Equipment:   Intra-op Plan:   Post-operative Plan:   Informed Consent: I have reviewed the patients History and Physical, chart, labs and discussed the procedure including the risks, benefits and alternatives for the proposed anesthesia with the patient or authorized representative who has indicated his/her understanding and acceptance.       Plan Discussed with: CRNA, Anesthesiologist and Surgeon  Anesthesia Plan Comments:         Anesthesia Quick Evaluation

## 2020-03-15 NOTE — Interval H&P Note (Signed)
History and Physical Interval Note:  03/15/2020 9:27 AM  Carrie Monroe  has presented today for surgery, with the diagnosis of AFIB.  The various methods of treatment have been discussed with the patient and family. After consideration of risks, benefits and other options for treatment, the patient has consented to  Procedure(s): CARDIOVERSION (N/A) as a surgical intervention.  [name] history has been reviewed, patient examined, no change in status, stable for surgery.  I have reviewed the patient's chart and labs.  Questions were answered to the patient's satisfaction.     Audelia Knape

## 2020-03-15 NOTE — Progress Notes (Signed)
PROGRESS NOTE   As per Dr. Cathlean Sauer: Carrie Monroe was admitted with the working diagnosis of acute on chronic systolic heart failure exacerbation, complicated with atrial fibrillation with rapid ventricular response  84 yo female with past medical history ofatrial fibrillation, systolic heart failure, severe aortic stenosis status post TAVR,andchronic kidney disease stage IIIb who presented with dyspnea and weight gain. Patient reported progressive, and worsening lower extremity edema, orthopnea, dyspnea on exertion and 11 pound weight gain. Her symptoms were refractive to increased dose of furosemide as an outpatient. Due to worsening symptoms she called EMS, she was found in atrial fibrillation with rapid ventricular response, she received 10 mg of IV diltiazem on route before arriving to the ED. On her initial physical examination she was tachycardic 131 bpm, tachypneic 26 breaths/min and hypoxic, oxygen saturation 80% on 2 L per nasal cannula, blood pressure 116/63, positive JVD, lungs with no wheezing, heart S1-S2 present, tachycardic, irregularly irregular, abdomen soft, Positive3+ bilateral lower extremity pitting edema. Sodium 140, potassium 4.2, chloride 102, bicarb 27, glucose 153, BUN 63, creatinine 2.79, magnesium 2.5, BNP 529, white count 8.1, hemoglobin 13.7, hematocrit 43.9, platelets 92. SARS COVID-19 negative. Urinalysis negative for infection,specific gravity 1.009. Chest radiograph with bilateral interstitial infiltrates at the lower zones, positive small bilateral pleural effusions. EKG 136 bpm, left axis deviation, left bundle branch block, measured QTC 400 ms, atrial fibrillation rhythm, noisy baseline, poor R wave progression, no significant ST segment changes, V5-V6 T wave inversion.  Patient was placed on diltiazem infusion and aggressive diuresis with furosemide.   Diltiazem was changed toamiodarone per cariology recommendations with good toleration.  She  has severehypervolemia, placed onfurosemide drip to target further negative fluid balance.(metolazone x2on 10/08-10/11)   Carrie Monroe  HFW:[id number] DOB: 07/08/1935 DOA: 03/02/2020 PCP: Marton Redwood, MD  Assessment & Plan:   Principal Problem:   Acute on chronic systolic CHF (congestive heart failure) ([place]) Active Problems:   CAD- severe 3V CAD    Chronic respiratory failure with hypoxia ([facility])   Atrial fibrillation with rapid ventricular response ([facility])   Secondary hypercoagulable state ([place])   AKI (acute kidney injury) ([place])   Thrombocytopenia ([place])   Pressure injury of skin   Acute on chronic systolic heart failure exacerbation: echo shows EF 40-45%, b/l atrial dilatation. Continue on lasix drip. Neg 2.4L today. Monitor I/Os. Continue on metoprolol, digoxin. Cardio recs apprec.  Intake/Output Summary (Last 24 hours) at 03/15/2020 1504 Last data filed at 03/15/2020 1348 Gross per 24 hour  Intake 368 ml  Output 1080 ml  Net -712 ml    I/O last 3 completed shifts: In: 480 [P.O.:480] Out: 1500 [Urine:1500] Total I/O In: 368 [P.O.:318; I.V.:50] Out: 31 [Urine:580]   Patient seen today for follow-up, patient states that she is feeling a lot better her swelling has improved and agrees with discharge planning.  Discussed with patient want to continue her diuretic therapy at home along with her metoprolol.Greatly appreciate  cardiology consult.  I/O last 3 completed shifts: In: 480 [P.O.:480] Out: 1500 [Urine:1500] Total I/O In: 368 [P.O.:318; I.V.:50] Out: 580 [Urine:580] Pt is stable and metoprolol and digoxin d/c yesterday and pt on amiodarone.  Pt to continue diuretic therapy per cardiology.  Aortic Stenosis: s/p TAVR.Continue on telemetry.   Atrial fib: w/ OYD:XAJOINOM on amiodarone, eliquis.  Pt s/p cardioversion today.  AKI on CKDIIIa: Cr is trending up from day prior. Will continue to monitor  Lab Results  Component Value Date   CREATININE 2.22 (H)  03/14/2020  CREATININE 2.14 (H) 03/12/2020   CREATININE 1.71 (H) 03/11/2020  Avoid nephrotoxic agents and meds renally dose all meds.    Hypokalemia: WNL today. Will continue to monitor in light of intermittent and ongoing use of diuretic therapy.   Likely contraction alkalosis: likely secondary to lasix use. Will continue to monitor,Today serum pco2 is 33.  Thrombocytopenia: etiology unclear. Continue eliquis. We will recheck cbc today..  CAD: w/ elevated troponins secondary to demand ischemia. Greatly appreciated cardiology management and care.   HLD: continue on statin ,crestor 10 mg.   Stage 2 sacrum pressure ulcer: present on admission.Continue w/ wound care.   Reactive hyperglycemia: HbA1c 5.4. Will continue to monitor.    DVT prophylaxis: eliquis  Code Status: full  Family Communication: Disposition Plan: PT/OT recs SNF   Status is: Inpatient  Remains inpatient appropriate because:Unsafe d/c plan and IV treatments appropriate due to intensity of illness or inability to take PO   Dispo: The patient is from: Home              Anticipated d/c is to: SNF              Anticipated d/c date is: 3 days              Patient currently is not medically stable to d/c.   Consultants:   cardio  Procedures:  None. Antimicrobials Anti-infectives (From admission, onward)   None      Subjective: Pt c/o malaise  03/13/2020: Pt seen today for f/u of her A.fib rvr with CHF exacerbation and worsening renal failure. Pt denies any complaints otherwise. 03/14/2020: Pt is doing better clinically.  Intake/Output Summary (Last 24 hours) at 03/15/2020 1504 Last data filed at 03/15/2020 1348 Gross per 24 hour  Intake 368 ml  Output 1080 ml  Net -712 ml   D/w her about PT eval prior to going home today.  03/15/2020; Pt seen today she is alert,awake and oriented and sleepy from sedation from her cardioversion.  Objective: Vitals:   03/15/20 1100 03/15/20 1115  03/15/20 1130 03/15/20 1156  BP: 139/61 (!) 137/58 (!) 137/119 (!) 145/62  Pulse: (!) 42 (!) 44 (!) 55 84  Resp: 19 18 (!) 21 20  Temp:    (!) 97.5 F (36.4 C)  TempSrc:    Oral  SpO2: 97% 100% 98% 97%  Weight:      Height:        Intake/Output Summary (Last 24 hours) at 03/15/2020 1504 Last data filed at 03/15/2020 1348 Gross per 24 hour  Intake 368 ml  Output 1080 ml  Net -712 ml   Filed Weights   03/13/20 0700 03/14/20 0457 03/15/20 0614  Weight: 93.8 kg 94.3 kg 94.4 kg    Examination: General exam: Appears calm and comfortable  Respiratory system: diminished breath sounds b/l. No rales, rhonchi  Cardiovascular system: irregularly irregular. No rubs, gallops or clicks. Gastrointestinal system: Abdomen is nondistended, soft and nontender. Normal bowel sounds heard. Central nervous system: Alert and oriented. Moves all 4 extremities  Psychiatry: Judgement and insight appear normal. Flat mood and affect  Data Reviewed: I have personally reviewed following labs and imaging studies  CBC: Recent Labs  Lab 03/13/20 0836 03/14/20 0153  WBC 5.5 5.4  NEUTROABS  --  3.8  HGB 13.2 12.5  HCT 41.1 40.2  MCV 101.5* 101.5*  PLT 105* 614*   Basic Metabolic Panel: Recent Labs  Lab 03/09/20 0328 03/10/20 0124 03/11/20 0146 03/12/20 0057 03/14/20 0153  NA  137 139 135 137 142  K 3.3* 3.4* 4.6 4.1 5.0  CL 87* 89* 90* 93* 102  CO2 41* 39* 36* 33* 33*  GLUCOSE 146* 133* 144* 133* 131*  BUN 66* 69* 66* 67* 67*  CREATININE 2.13* 1.81* 1.71* 2.14* 2.22*  CALCIUM 9.4 9.7 9.3 9.3 9.6  MG  --  2.0  --  1.9  --    GFR: Estimated Creatinine Clearance: 21.3 mL/min (A) (by C-G formula based on SCr of 2.22 mg/dL (H)).  Radiology Studies: No results found.  cheduled Meds: . [START ON 03/16/2020] amiodarone  200 mg Oral Daily  . apixaban  2.5 mg Oral BID  . polyethylene glycol  17 g Oral BID  . potassium chloride  40 mEq Oral Daily  . rosuvastatin  10 mg Oral Daily  . sodium  chloride flush  3 mL Intravenous Q12H   Continuous Infusions: . sodium chloride       LOS: 13 days   Time spent: 20 mins  Para Skeans, MD [facility] Pager [phone]. If 7PM-7AM, please contact night-coverage [email or web address] 03/15/2020, 3:04 PM

## 2020-03-15 NOTE — Op Note (Signed)
Procedure: Electrical Cardioversion Indications:  Atrial Fibrillation  Procedure Details:  Consent: Risks of procedure as well as the alternatives and risks of each were explained to the (patient/caregiver).  Consent for procedure obtained.  Time Out: Verified patient identification, verified procedure, site/side was marked, verified correct patient position, special equipment/implants available, medications/allergies/relevent history reviewed, required imaging and test results available.  Performed  Patient placed on cardiac monitor, pulse oximetry, supplemental oxygen as necessary.  Sedation given: Dr. Marcie Bal, IV propofol Pacer pads placed anterior and posterior chest.  Cardioverted 1 time(s).  Cardioversion with synchronized biphasic 200J shock.  Evaluation: Findings: Post procedure EKG shows: sinus bradycardia Complications: None Patient did tolerate procedure well.  Time Spent Directly with the Patient:  30 minutes   Carrie Monroe 03/15/2020, 9:53 AM

## 2020-03-15 NOTE — Progress Notes (Signed)
Pt received from endoscopy. VSS. Heart rhythm SB in the 50's. Will continue to monitor.  Clyde Canterbury, RN

## 2020-03-15 NOTE — Transfer of Care (Signed)
Immediate Anesthesia Transfer of Care Note  Patient: Carrie Monroe  Procedure(s) Performed: CARDIOVERSION (N/A )  Patient Location: PACU  Anesthesia Type:General  Level of Consciousness: patient cooperative  Airway & Oxygen Therapy: Patient Spontanous Breathing and Patient connected to face mask oxygen  Post-op Assessment: Report given to RN and Post -op Vital signs reviewed and stable  Post vital signs: Reviewed and stable  Last Vitals:  Vitals Value Taken Time  BP    Temp    Pulse    Resp    SpO2      Last Pain:  Vitals:   03/15/20 0908  TempSrc: Oral  PainSc: 0-No pain         Complications: No complications documented.

## 2020-03-16 ENCOUNTER — Encounter (HOSPITAL_COMMUNITY): Payer: Self-pay | Admitting: Cardiovascular Disease

## 2020-03-16 DIAGNOSIS — R778 Other specified abnormalities of plasma proteins: Secondary | ICD-10-CM

## 2020-03-16 DIAGNOSIS — R001 Bradycardia, unspecified: Secondary | ICD-10-CM

## 2020-03-16 DIAGNOSIS — N179 Acute kidney failure, unspecified: Secondary | ICD-10-CM | POA: Diagnosis not present

## 2020-03-16 DIAGNOSIS — R739 Hyperglycemia, unspecified: Secondary | ICD-10-CM

## 2020-03-16 DIAGNOSIS — I4891 Unspecified atrial fibrillation: Secondary | ICD-10-CM | POA: Diagnosis not present

## 2020-03-16 DIAGNOSIS — I25110 Atherosclerotic heart disease of native coronary artery with unstable angina pectoris: Secondary | ICD-10-CM | POA: Diagnosis not present

## 2020-03-16 DIAGNOSIS — R531 Weakness: Secondary | ICD-10-CM

## 2020-03-16 DIAGNOSIS — I5023 Acute on chronic systolic (congestive) heart failure: Secondary | ICD-10-CM | POA: Diagnosis not present

## 2020-03-16 DIAGNOSIS — E875 Hyperkalemia: Secondary | ICD-10-CM

## 2020-03-16 LAB — RENAL FUNCTION PANEL
Albumin: 3.1 g/dL — ABNORMAL LOW (ref 3.5–5.0)
Anion gap: 8 (ref 5–15)
BUN: 58 mg/dL — ABNORMAL HIGH (ref 8–23)
CO2: 28 mmol/L (ref 22–32)
Calcium: 9.9 mg/dL (ref 8.9–10.3)
Chloride: 100 mmol/L (ref 98–111)
Creatinine, Ser: 2.05 mg/dL — ABNORMAL HIGH (ref 0.44–1.00)
GFR, Estimated: 22 mL/min — ABNORMAL LOW (ref >60)
Glucose, Bld: 201 mg/dL — ABNORMAL HIGH (ref 70–99)
Phosphorus: 3.2 mg/dL (ref 2.5–4.6)
Potassium: 5.5 mmol/L — ABNORMAL HIGH (ref 3.5–5.1)
Sodium: 136 mmol/L (ref 135–145)

## 2020-03-16 LAB — CBC
HCT: 40 % (ref 36.0–46.0)
Hemoglobin: 12.5 g/dL (ref 12.0–15.0)
MCH: 32.1 pg (ref 26.0–34.0)
MCHC: 31.3 g/dL (ref 30.0–36.0)
MCV: 102.8 fL — ABNORMAL HIGH (ref 80.0–100.0)
Platelets: 107 10*3/uL — ABNORMAL LOW (ref 150–400)
RBC: 3.89 MIL/uL (ref 3.87–5.11)
RDW: 16.1 % — ABNORMAL HIGH (ref 11.5–15.5)
WBC: 5 10*3/uL (ref 4.0–10.5)
nRBC: 0 % (ref 0.0–0.2)

## 2020-03-16 LAB — MAGNESIUM: Magnesium: 2.3 mg/dL (ref 1.7–2.4)

## 2020-03-16 NOTE — Progress Notes (Signed)
03/16/20 1500  PT Visit Information  Last PT Received On 03/16/20  Assistance Needed +1  History of Present Illness 84 y.o. female presenting with weight gain, worsening of chronic BLE edema, and progressive dyspnea. Patient admitted with acute on chronic systolic CHF. Previous admission 11/2019 with new onset afib w/ RVR and acute on chronic CHF. PMH includes: 3-vessel CAD s/p DES PCI (2018), severe AS s/p TAVR (2018), chronic HFpEF, HTN, HLD, DMII, and CKD stage IIIb.   Subjective Data  Patient Stated Goal Patient is anxious to get back home.  Precautions  Precautions Fall  Restrictions  Weight Bearing Restrictions No  Pain Assessment  Pain Assessment No/denies pain  Cognition  Arousal/Alertness Awake/alert  Behavior During Therapy WFL for tasks assessed/performed  Overall Cognitive Status Within Functional Limits for tasks assessed  Area of Impairment Problem solving;Memory  Memory Decreased short-term memory  Problem Solving Requires verbal cues  General Comments Pt required constant VCs throughout, redirection due to STM deficits.  Bed Mobility  Overal bed mobility Needs Assistance  Bed Mobility Supine to Sit  Supine to sit Min assist;Min guard  General bed mobility comments Min A and increased time/effort. Patient able to advance LLE from EOB to bed level but required assist to bring RUE to bed surface.  Transfers  Overall transfer level Needs assistance  Equipment used Rolling walker (2 wheeled)  Transfers Sit to/from Stand  Sit to Stand Min guard;Supervision  General transfer comment cues for hand placement only  Ambulation/Gait  Ambulation/Gait assistance Min guard  Gait Distance (Feet) 50 Feet  Assistive device Rolling walker (2 wheeled)  Gait Pattern/deviations Wide base of support;Trunk flexed;Step-through pattern;Decreased stride length  General Gait Details Pt required cues for safety with RW.  No c/o dizziness this session.  Pt was steady overall with RW.     Gait velocity reduced  Gait velocity interpretation <1.31 ft/sec, indicative of household ambulator  Balance  Overall balance assessment Needs assistance  Sitting-balance support No upper extremity supported;Feet supported  Sitting balance-Leahy Scale Good  Standing balance support Bilateral upper extremity supported  Standing balance-Leahy Scale Fair  Standing balance comment Pt was able to steady herself with UE support and RW.    General Exercises - Lower Extremity  Ankle Circles/Pumps AROM;Both;20 reps;Supine  Quad Sets AROM;Both;10 reps;Supine  Long Arc Quad AROM;Both;10 reps;Supine  Heel Slides AROM;Both;10 reps;Supine  PT - End of Session  Equipment Utilized During Treatment Gait belt  Activity Tolerance Patient tolerated treatment well  Patient left in chair;with call bell/phone within reach;with chair alarm set  Nurse Communication Mobility status   PT - Assessment/Plan  PT Plan Current plan remains appropriate  PT Visit Diagnosis Unsteadiness on feet (R26.81);Muscle weakness (generalized) (M62.81)  PT Frequency (ACUTE ONLY) Min 3X/week  Follow Up Recommendations SNF;Supervision/Assistance - 24 hour  PT equipment None recommended by PT  AM-PAC PT "6 Clicks" Mobility Outcome Measure (Version 2)  Help needed turning from your back to your side while in a flat bed without using bedrails? 3  Help needed moving from lying on your back to sitting on the side of a flat bed without using bedrails? 3  Help needed moving to and from a bed to a chair (including a wheelchair)? 4  Help needed standing up from a chair using your arms (e.g., wheelchair or bedside chair)? 4  Help needed to walk in hospital room? 3  Help needed climbing 3-5 steps with a railing?  3  6 Click Score 20  Consider Recommendation of  Discharge To: Home with no services  PT Goal Progression  Progress towards PT goals Progressing toward goals  PT Time Calculation  PT Start Time (ACUTE ONLY) 1210  PT Stop Time  (ACUTE ONLY) 1225  PT Time Calculation (min) (ACUTE ONLY) 15 min  PT General Charges  $$ ACUTE PT VISIT 1 Visit  PT Treatments  $Gait Training 8-22 mins  Pt was able to ambulate to door and back to chair.  Pt did not want to ambulate further today as she c/o fatigue.  Will continue to progress pt as able.  [place] W,PT Acute Rehabilitation Services Pager:  [phone]  Office:  [phone]

## 2020-03-16 NOTE — Progress Notes (Signed)
Progress Note  Patient Name: Carrie Monroe Date of Encounter: 03/16/2020  [facility] Cardiologist: Sanda Klein, MD   Subjective   Feels better, less dizzy. Maintaining NSR, mild bradycardia. Weight up a little bit at 211 lb, as is BP. BUN and creatinine improved, although not yet at baseline. DBP remains in 77s.  Inpatient Medications    Scheduled Meds: . amiodarone  200 mg Oral Daily  . apixaban  2.5 mg Oral BID  . polyethylene glycol  17 g Oral BID  . potassium chloride  40 mEq Oral Daily  . rosuvastatin  10 mg Oral Daily  . sodium chloride flush  3 mL Intravenous Q12H   Continuous Infusions: . sodium chloride     PRN Meds: sodium chloride, acetaminophen, docusate sodium, ondansetron (ZOFRAN) IV, sodium chloride flush   Vital Signs    Vitals:   03/16/20 0730 03/16/20 0853 03/16/20 1040 03/16/20 1048  BP: (!) 151/77 (!) 109/46 (!) 121/53 (!) 132/52  Pulse: 64  (!) 54   Resp: 19  18 18   Temp: 97.8 F (36.6 C)     TempSrc: Oral     SpO2: 92%     Weight:      Height:        Intake/Output Summary (Last 24 hours) at 03/16/2020 1059 Last data filed at 03/16/2020 1049 Gross per 24 hour  Intake 568 ml  Output 1000 ml  Net -432 ml   Last 3 Weights 03/16/2020 03/15/2020 03/14/2020  Weight (lbs) 211 lb 4.8 oz 208 lb 1.6 oz 207 lb 14.3 oz  Weight (kg) 95.845 kg 94.394 kg 94.3 kg      Telemetry    Sinus bradycardia - Personally Reviewed  ECG    No new tracing  - Personally Reviewed  Physical Exam  Smiling. Lying fully flat without breathing difficulty. GEN: No acute distress.   Neck: No JVD Cardiac: RRR, no murmurs, rubs, or gallops.  Respiratory: Clear to auscultation bilaterally. GI: Soft, nontender, non-distended  MS: No edema; No deformity. Neuro:  Nonfocal  Psych: Normal affect   Labs    High Sensitivity Troponin:   Recent Labs  Lab 03/02/20 1955 03/03/20 0327  TROPONINIHS 54* 52*      Chemistry Recent Labs  Lab  03/12/20 0057 03/14/20 0153 03/16/20 1010  NA 137 142 136  K 4.1 5.0 5.5*  CL 93* 102 100  CO2 33* 33* 28  GLUCOSE 133* 131* 201*  BUN 67* 67* 58*  CREATININE 2.14* 2.22* 2.05*  CALCIUM 9.3 9.6 9.9  ALBUMIN  --   --  3.1*  GFRNONAA 21* 20* 22*  ANIONGAP 11 7 8      Hematology Recent Labs  Lab 03/13/20 0836 03/14/20 0153 03/16/20 1010  WBC 5.5 5.4 5.0  RBC 4.05 3.96 3.89  HGB 13.2 12.5 12.5  HCT 41.1 40.2 40.0  MCV 101.5* 101.5* 102.8*  MCH 32.6 31.6 32.1  MCHC 32.1 31.1 31.3  RDW 16.1* 16.1* 16.1*  PLT 105* 103* 107*    BNPNo results for input(s): BNP, PROBNP in the last 168 hours.   DDimer No results for input(s): DDIMER in the last 168 hours.   Radiology    No results found.  Cardiac Studies   Echo06/27/2021  1. Left ventricular ejection fraction, by estimation, is 40 to 45%. The  left ventricle has mildly decreased function. The left ventricle  demonstrates global hypokinesis. There is mild concentric left ventricular  hypertrophy. Left ventricular diastolic  function could not be evaluated.  2. Right ventricular systolic function is mildly reduced. The right  ventricular size is mildly enlarged. There is moderately elevated  pulmonary artery systolic pressure. The estimated right ventricular  systolic pressure is 67.1 mmHg.  3. Left atrial size was severely dilated.  4. Right atrial size was severely dilated.  5. The mitral valve is normal in structure. Mild to moderate mitral valve  regurgitation.  6. The aortic valve has been repaired/replaced. Aortic valve  regurgitation is not visualized. There is a 26 mm Medtronic  CoreValve-EvolutR prosthetic (TAVR) valve present in the aortic position.  Procedure Date: September 2018. Echo findings are  consistent with normal structure and function of the aortic valve  prosthesis.  7. The inferior vena cava is dilated in size with <50% respiratory  variability, suggesting right atrial pressure of 15  mmHg.   Comparison(s): Changes from prior study are noted. The left ventricular  function is worsened.   Patient Profile     85 y.o. female female admitted to the hospital for decompensated heart failure and atrial fibrillation with rapid ventricular response. History of severe AS status post TAVR 01/2017 with 26 mm Medtronic CoreValve evolute pro, CAD with multivessel PCI in 2018, heart failure with reduced ejection fraction EF 40 to 45%, persistent atrial fibrillation on Eliquis, breast cancer status post lumpectomy and history of radiation, DM2, hypertension, hyperlipidemia. DCCV 03/15/2020.   Assessment & Plan    1. Atrial Fibrillation:maintaining sinus bradycardia after DCCV, on amiodarone 200 mg daily. She has severe biatrial dilation and her odds of long-term maintenance of sinus rhythm remain low.CHA2DS2-VASc 5(age 64, gender, CHF, CAD). 2. Acute onChronic Systolic Heart Failure:closer to target weight of 212-216 lb.Minimal worsening noted in EF from prior TTE in 01/2019 (EF 45-50% -->EF 40-45%). 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 Will restart furosemide 40 mg once daily to be taken only on days that weight is 216 lb or higher (with KCl supplement on those days only). 3. CAD:S/p DES of the LAD and RCA in 2018.No angina, even with RVR. 4. HLP:On statin, with most recent LDL cholesterol 67. 5. S/P TAVR:normal prosthesis function by recent echo. 6. Acute onCKD 3b:Creatabove baseline after diuresis, but now improving. Resume diuretic tomorrow. Creatinine has been 1.36-1.71 over the last few months. Suspect she will need furosemide 80 mg daily, but wait until weight 212 lb and creat back to baseline. K high - stop the supplement. 7. Chronic thrombocytopenia:stable around 100K. No bleeding. 8. Diastolic hypotension:stop metoprolol. May be reason for her dizziness. May  need midodrine in the future.    Anticipate she may be ready for DC tomorrow.      For questions or updates, please contact Carrie Monroe Please consult [email or web address] for contact info under        Signed, Sanda Klein, MD  03/16/2020, 10:59 AM

## 2020-03-16 NOTE — Anesthesia Postprocedure Evaluation (Signed)
Anesthesia Post Note  Patient: Carrie Monroe  Procedure(s) Performed: CARDIOVERSION (N/A )     Patient location during evaluation: Endoscopy Anesthesia Type: General Level of consciousness: awake and alert Pain management: pain level controlled Vital Signs Assessment: post-procedure vital signs reviewed and stable Respiratory status: spontaneous breathing, nonlabored ventilation, respiratory function stable and patient connected to nasal cannula oxygen Cardiovascular status: blood pressure returned to baseline and stable Postop Assessment: no apparent nausea or vomiting Anesthetic complications: no   No complications documented.  Last Vitals:  Vitals:   03/16/20 0730 03/16/20 0853  BP: (!) 151/77 (!) 109/46  Pulse: 64   Resp: 19   Temp: 36.6 C   SpO2: 92%     Last Pain:  Vitals:   03/16/20 0800  TempSrc:   PainSc: 0-No pain                 Misha Antonini S

## 2020-03-16 NOTE — TOC Progression Note (Signed)
Transition of Care [facility]) - Progression Note    Patient Details  Name: Carrie Monroe MRN: [id number] Date of Birth: [birth year]  [facility] CM/SW [place], [place] Phone Number: 03/16/2020, 6:02 PM  Clinical Narrative:     CSW visit with patient to confirm disposition. Patient declined SNF. Patient states her son, Carrie Monroe and her daughter in law are has agreed to come and stay with her. She states her daughter will be here from [place] on Saturday instead of Sunday.   Clinical Social Worker will sign off for now as social work intervention is no longer needed. Please consult [place] if new need arises.    Thurmond Butts, [place] Clinical Social Worker    Expected Discharge Plan: Skilled Nursing Facility Barriers to Discharge: Continued Medical Work up  Expected Discharge Plan and Services Expected Discharge Plan: [place]   Discharge Planning Services: CM Consult Post Acute Care Choice: [place] arrangements for the past 2 months: Single Family Home                   DME Agency: NA         HH Agency: [facility]         Social Determinants of Health (SDOH) Interventions    Readmission Risk Interventions No flowsheet data found.

## 2020-03-16 NOTE — Progress Notes (Signed)
PROGRESS NOTE  Carrie Monroe PJK:[id number] DOB: 1936-03-13   PCP: Marton Redwood, MD  Patient is from: Home.  Lives alone.  Independently ambulates at baseline.  DOA: 03/02/2020 LOS: 23  Chief complaints: Shortness of breath, weight gain  Brief Narrative / Interim history: 84 year old F with PMH of A. fib, systolic CHF, severe AS s/p TAVR and CKD-3B presenting with progressive BLE edema, orthopnea, DOE and about 11 pound weight gain despite increased p.o. diuretics at home.  She was admitted for acute hypoxemic respiratory failure due to acute on chronic systolic CHF and A. fib with RVR.   In ED, in A. fib with RVR to 130s.  Desaturated to 80% requiring 2 L by [place]. JVD on exam. Cr 2.79.  BUN 63.  BNP 529.  COVID-19 PCR negative.  CXR with bilateral interstitial infiltrates and small bilateral pleural effusions.  Patient was started on Cardizem drip and IV diuretics.  Cardiology consulted.  In regards to A. fib with RVR, patient underwent DCCV on 03/15/2020.  He is on amiodarone and Eliquis.  Now rate controlled.  In regards to CHF, diuresed with IV Lasix.  Developed some AKI.  Now cardiology recommending p.o. Lasix 40 mg daily as needed for weight over 216 pounds.  Anticipate discharge home on 10/21.  Subjective: Seen and examined earlier this morning.  No major events overnight of this morning.  She reports feeling lightheaded although slightly better.  Denies palpitation, chest pain or dyspnea.  Denies GI or UTI symptoms.  Objective: Vitals:   03/16/20 0730 03/16/20 0853 03/16/20 1040 03/16/20 1048  BP: (!) 151/77 (!) 109/46 (!) 121/53 (!) 132/52  Pulse: 64  (!) 54   Resp: 19  18 18   Temp: 97.8 F (36.6 C)     TempSrc: Oral     SpO2: 92%     Weight:      Height:        Intake/Output Summary (Last 24 hours) at 03/16/2020 1356 Last data filed at 03/16/2020 1324 Gross per 24 hour  Intake 490 ml  Output 701 ml  Net -211 ml   Filed Weights   03/14/20 0457 03/15/20 0614  03/16/20 0438  Weight: 94.3 kg 94.4 kg 95.8 kg    Examination:  GENERAL: No apparent distress.  Nontoxic. HEENT: MMM.  Vision and hearing grossly intact.  NECK: Supple.  Still with JVD. RESP:  No IWOB.  Fair aeration bilaterally. CVS: Bradycardic to 35s. Heart sounds normal.  ABD/GI/GU: BS+. Abd soft, NTND.  MSK/EXT:  Moves extremities. No apparent deformity.  Trace edema. SKIN: no apparent skin lesion or wound NEURO: Awake, alert and oriented appropriately.  No apparent focal neuro deficit. PSYCH: Calm. Normal affect.   Procedures:  03/15/2020-DCCV for A. fib  Microbiology summarized: COVID-19 PCR negative. Influenza PCR negative. Urine culture with Pseudomonas aeruginosa  Assessment & Plan: Acute on chronic systolic CHF: Echo in 12/996 with EF 40-45%, global hypokinesis, RVSP of 61 mmHg, severe LAE and severe RAE, and normal TAVR.  Diuresed with IV Lasix.  Net -23.5 L.  Diuretics on hold due to AKI.  Appears euvolemic except for JVD and trace edema. -Cardiology following-p.o. Lasix 40 mg as needed weight above 216 pounds -Monitor fluid status and renal functions -GDMT on hold due to some orthostasis.  Paroxysmal atrial fibrillation with RVR: RVR resolved.  Now bradycardic to 40s and 50s.  S/p DCCV on 10/19.  CHA2DS2-VASc score 5 -On amiodarone and Eliquis per cardiology.  Diastolic hypotension  Elevated troponin-likely demand ischemia from  CHF and A. fib.  No chest pain. History of CAD s/p DES to LAD and RCA in 2018-stable. -Continue statin.  History of AS s/p TAVR: Stable  AKI on CKD-3A/azotemia: Baseline Cr 1.3-1.7>>> 2.75 (admit)> 2.05.  BUN 58. -Diuretics on hold. -Continue monitoring  Hyperkalemia: K5.5. -Recheck in the morning  Hyperglycemia without diabetes: A1c 5.4%.  BMP glucose 201. -Start SSI  Chronic thrombocytopenia: Improved. -Continue monitoring  Diastolic hypotension -Metoprolol discontinued by cardiology  Debility/physical  deconditioning -PT/OT-she refuses SNF.  Body mass index is 37.43 kg/m.        Stage II sacral pressure ulcer: POA. -Continue wound care Pressure Injury 03/03/20 Sacrum Mid Stage 2 -  Partial thickness loss of dermis presenting as a shallow open injury with a red, pink wound bed without slough. (Active)  03/03/20 0226  Location: Sacrum  Location Orientation: Mid  Staging: Stage 2 -  Partial thickness loss of dermis presenting as a shallow open injury with a red, pink wound bed without slough.  Wound Description (Comments):   Present on Admission: Yes   DVT prophylaxis:  apixaban (ELIQUIS) tablet 2.5 mg Start: 03/03/20 1000 apixaban (ELIQUIS) tablet 2.5 mg  Code Status: Full code Family Communication: Patient and/or RN. Available if any question.  Status is: Inpatient  Remains inpatient appropriate because:Hemodynamically unstable, Ongoing diagnostic testing needed not appropriate for outpatient work up, Unsafe d/c plan and Inpatient level of care appropriate due to severity of illness   Dispo: The patient is from: Home              Anticipated d/c is to: Home              Anticipated d/c date is: 1 day              Patient currently is not medically stable to d/c.       Consultants:  Cardiology   Sch Meds:  Scheduled Meds: . amiodarone  200 mg Oral Daily  . apixaban  2.5 mg Oral BID  . polyethylene glycol  17 g Oral BID  . rosuvastatin  10 mg Oral Daily  . sodium chloride flush  3 mL Intravenous Q12H   Continuous Infusions: . sodium chloride     PRN Meds:.sodium chloride, acetaminophen, docusate sodium, ondansetron (ZOFRAN) IV, sodium chloride flush  Antimicrobials: Anti-infectives (From admission, onward)   None       I have personally reviewed the following labs and images: CBC: Recent Labs  Lab 03/13/20 0836 03/14/20 0153 03/16/20 1010  WBC 5.5 5.4 5.0  NEUTROABS  --  3.8  --   HGB 13.2 12.5 12.5  HCT 41.1 40.2 40.0  MCV 101.5* 101.5* 102.8*   PLT 105* 103* 107*   BMP &GFR Recent Labs  Lab 03/10/20 0124 03/11/20 0146 03/12/20 0057 03/14/20 0153 03/16/20 1010  NA 139 135 137 142 136  K 3.4* 4.6 4.1 5.0 5.5*  CL 89* 90* 93* 102 100  CO2 39* 36* 33* 33* 28  GLUCOSE 133* 144* 133* 131* 201*  BUN 69* 66* 67* 67* 58*  CREATININE 1.81* 1.71* 2.14* 2.22* 2.05*  CALCIUM 9.7 9.3 9.3 9.6 9.9  MG 2.0  --  1.9  --  2.3  PHOS  --   --   --   --  3.2   Estimated Creatinine Clearance: 23.3 mL/min (A) (by C-G formula based on SCr of 2.05 mg/dL (H)). Liver & Pancreas: Recent Labs  Lab 03/16/20 1010  ALBUMIN 3.1*   No results for  input(s): LIPASE, AMYLASE in the last 168 hours. No results for input(s): AMMONIA in the last 168 hours. Diabetic: No results for input(s): HGBA1C in the last 72 hours. No results for input(s): GLUCAP in the last 168 hours. Cardiac Enzymes: No results for input(s): CKTOTAL, CKMB, CKMBINDEX, TROPONINI in the last 168 hours. No results for input(s): PROBNP in the last 8760 hours. Coagulation Profile: No results for input(s): INR, PROTIME in the last 168 hours. Thyroid Function Tests: No results for input(s): TSH, T4TOTAL, FREET4, T3FREE, THYROIDAB in the last 72 hours. Lipid Profile: No results for input(s): CHOL, HDL, LDLCALC, TRIG, CHOLHDL, LDLDIRECT in the last 72 hours. Anemia Panel: No results for input(s): VITAMINB12, FOLATE, FERRITIN, TIBC, IRON, RETICCTPCT in the last 72 hours. Urine analysis:    Component Value Date/Time   COLORURINE YELLOW 03/02/2020 2151   APPEARANCEUR CLEAR 03/02/2020 2151   LABSPEC 1.009 03/02/2020 2151   PHURINE 5.0 03/02/2020 2151   GLUCOSEU NEGATIVE 03/02/2020 2151   HGBUR NEGATIVE 03/02/2020 2151   BILIRUBINUR NEGATIVE 03/02/2020 2151   [place] NEGATIVE 03/02/2020 2151   PROTEINUR NEGATIVE 03/02/2020 2151   UROBILINOGEN 1.0 07/28/2013 1305   NITRITE NEGATIVE 03/02/2020 2151   LEUKOCYTESUR NEGATIVE 03/02/2020 2151   Sepsis Labs: Invalid input(s):  PROCALCITONIN, [place]  Microbiology: No results found for this or any previous visit (from the past 240 hour(s)).  Radiology Studies: No results found.    Young Mulvey T. [place]  If 7PM-7AM, please contact night-coverage [email or web address] 03/16/2020, 1:56 PM

## 2020-03-17 DIAGNOSIS — I5023 Acute on chronic systolic (congestive) heart failure: Secondary | ICD-10-CM | POA: Diagnosis not present

## 2020-03-17 DIAGNOSIS — N179 Acute kidney failure, unspecified: Secondary | ICD-10-CM | POA: Diagnosis not present

## 2020-03-17 DIAGNOSIS — I25110 Atherosclerotic heart disease of native coronary artery with unstable angina pectoris: Secondary | ICD-10-CM | POA: Diagnosis not present

## 2020-03-17 DIAGNOSIS — I4891 Unspecified atrial fibrillation: Secondary | ICD-10-CM | POA: Diagnosis not present

## 2020-03-17 LAB — CBC
HCT: 35.5 % — ABNORMAL LOW (ref 36.0–46.0)
Hemoglobin: 11.2 g/dL — ABNORMAL LOW (ref 12.0–15.0)
MCH: 32 pg (ref 26.0–34.0)
MCHC: 31.5 g/dL (ref 30.0–36.0)
MCV: 101.4 fL — ABNORMAL HIGH (ref 80.0–100.0)
Platelets: 91 10*3/uL — ABNORMAL LOW (ref 150–400)
RBC: 3.5 MIL/uL — ABNORMAL LOW (ref 3.87–5.11)
RDW: 16 % — ABNORMAL HIGH (ref 11.5–15.5)
WBC: 5.1 10*3/uL (ref 4.0–10.5)
nRBC: 0 % (ref 0.0–0.2)

## 2020-03-17 LAB — GLUCOSE, CAPILLARY
Glucose-Capillary: 158 mg/dL — ABNORMAL HIGH (ref 70–99)
Glucose-Capillary: 163 mg/dL — ABNORMAL HIGH (ref 70–99)

## 2020-03-17 LAB — RENAL FUNCTION PANEL
Albumin: 2.8 g/dL — ABNORMAL LOW (ref 3.5–5.0)
Anion gap: 7 (ref 5–15)
BUN: 60 mg/dL — ABNORMAL HIGH (ref 8–23)
CO2: 27 mmol/L (ref 22–32)
Calcium: 9.7 mg/dL (ref 8.9–10.3)
Chloride: 103 mmol/L (ref 98–111)
Creatinine, Ser: 2.28 mg/dL — ABNORMAL HIGH (ref 0.44–1.00)
GFR, Estimated: 19 mL/min — ABNORMAL LOW (ref >60)
Glucose, Bld: 164 mg/dL — ABNORMAL HIGH (ref 70–99)
Phosphorus: 3.2 mg/dL (ref 2.5–4.6)
Potassium: 5.4 mmol/L — ABNORMAL HIGH (ref 3.5–5.1)
Sodium: 137 mmol/L (ref 135–145)

## 2020-03-17 LAB — MAGNESIUM: Magnesium: 2.2 mg/dL (ref 1.7–2.4)

## 2020-03-17 MED ORDER — SODIUM ZIRCONIUM CYCLOSILICATE 10 G PO PACK
10.0000 g | PACK | Freq: Once | ORAL | Status: AC
  Administered 2020-03-17: 10 g via ORAL
  Filled 2020-03-17: qty 1

## 2020-03-17 MED ORDER — INSULIN ASPART 100 UNIT/ML ~~LOC~~ SOLN: 0.0000 [IU] | Freq: Three times a day (TID) | SUBCUTANEOUS | Status: DC

## 2020-03-17 MED ORDER — INSULIN ASPART 100 UNIT/ML ~~LOC~~ SOLN: 0.0000 [IU] | Freq: Every day | SUBCUTANEOUS | Status: DC

## 2020-03-17 NOTE — Plan of Care (Signed)
?  Problem: Clinical Measurements: ?Goal: Will remain free from infection ?Outcome: Progressing ?  ?

## 2020-03-17 NOTE — Progress Notes (Signed)
PROGRESS NOTE  Carrie Monroe:[id number] DOB: 1935-10-31   PCP: Marton Redwood, MD  Patient is from: Home.  Lives alone.  Independently ambulates at baseline.  DOA: 03/02/2020 LOS: 95  Chief complaints: Shortness of breath, weight gain  Brief Narrative / Interim history: 84 year old F with PMH of A. fib, systolic CHF, severe AS s/p TAVR and CKD-3B presenting with progressive BLE edema, orthopnea, DOE and about 11 pound weight gain despite increased p.o. diuretics at home.  She was admitted for acute hypoxemic respiratory failure due to acute on chronic systolic CHF and A. fib with RVR.   In ED, in A. fib with RVR to 130s.  Desaturated to 80% requiring 2 L by [place]. JVD on exam. Cr 2.79.  BUN 63.  BNP 529.  COVID-19 PCR negative.  CXR with bilateral interstitial infiltrates and small bilateral pleural effusions.  Patient was started on Cardizem drip and IV diuretics.  Cardiology consulted.  In regards to A. fib with RVR, patient underwent DCCV on 03/15/2020.  He is on amiodarone and Eliquis.  Now rate controlled.  In regards to CHF, diuresed with IV Lasix.  Developed some AKI.  Now cardiology recommending p.o. Lasix 40 mg daily as needed for weight over 216 pounds.  Cleared by cardiology for discharge but still with orthostasis, and uptrending creatinine.  Anticipate discharge home on 10/22.    Subjective: Seen and examined earlier this morning.  No major events overnight of this morning.  She says she does not feel well.  When asked to elaborate, she says she continues to feel lightheaded especially she gets up.  Denies chest pain or dyspnea.  Denies palpitation.  Denies GI or UTI symptoms.  Objective: Vitals:   03/17/20 0425 03/17/20 0500 03/17/20 0720 03/17/20 1204  BP:   (!) 135/54 (!) 134/49  Pulse:   61 (!) 57  Resp: 20 20 18 18   Temp:   97.9 F (36.6 C) 97.8 F (36.6 C)  TempSrc:   Oral Oral  SpO2:   91% 96%  Weight:  95.4 kg    Height:        Intake/Output Summary  (Last 24 hours) at 03/17/2020 1530 Last data filed at 03/17/2020 0640 Gross per 24 hour  Intake 460 ml  Output 630 ml  Net -170 ml   Filed Weights   03/15/20 0614 03/16/20 0438 03/17/20 0500  Weight: 94.4 kg 95.8 kg 95.4 kg    Examination:  GENERAL: No apparent distress.  Nontoxic. HEENT: MMM.  Vision and hearing grossly intact.  NECK: Supple.  Difficult to assess JVD while sitting upright on commode RESP:  No IWOB.  Fair aeration bilaterally. CVS: HR in 34s.  Heart sounds normal.  ABD/GI/GU: BS+. Abd soft, NTND.  MSK/EXT:  Moves extremities. No apparent deformity.  Trace to 1+ edema bilaterally. SKIN: no apparent skin lesion or wound NEURO: Awake, alert and oriented appropriately.  No apparent focal neuro deficit. PSYCH: Calm. Normal affect.  Procedures:  03/15/2020-DCCV for A. fib  Microbiology summarized: COVID-19 PCR negative. Influenza PCR negative. Urine culture with Pseudomonas aeruginosa  Assessment & Plan: Acute on chronic systolic CHF: Echo in 01/5620 with EF 40-45%, global hypokinesis, RVSP of 61 mmHg, severe LAE and severe RAE, and normal TAVR.  Diuresed with IV Lasix which is now on hold due to AKI and orthostasis.  Net -24 L.  She had about 1 L UOP/24 hours without diuretics.  Low DBP. -Appreciate cardiology recs: - amiodarone 200 mg daily - furosemide 40 mg  only on days with weight 216 lb or over - KCl 20 mEq daily, on days when taking furosemide  - eliquis 2.5 mg twice daily - rosuvastatin 10 mg daily  -Stop metoprolol  -BMET in 2 weeks at f/u appt  -outpt f/up: TOC 2 weeks, see me in 2 months  -cardiology following-p.o. -Monitor fluid status and renal functions   Paroxysmal atrial fibrillation with RVR: RVR resolved.  Now bradycardic to 50s and lower 60s.  S/p DCCV on 10/19.  CHA2DS2-VASc score 5 -On amiodarone and Eliquis per cardiology.  Orthostasis-continues to endorse postural dizziness Diastolic hypotension: DBP in 40s and 50s. -Cardiac meds  as above -TED hose  Elevated troponin-likely demand ischemia from CHF and A. fib.  No chest pain. History of CAD s/p DES to LAD and RCA in 2018-stable. -Continue statin.  History of AS s/p TAVR: Stable  AKI on CKD-3A/azotemia: Baseline Cr 1.3-1.7>>> 2.75 (admit)> 2.05> 2.28.  BUN 58> 60. -Diuretics on hold. -Recheck in the morning  Hyperkalemia: K5.5> 5.4 -Lokelma 10 g x 1 -Recheck in the morning  Hyperglycemia without diabetes: A1c 5.4%.  BMP glucose elevated. -Start CBG monitoring and SSI -Follow hemoglobin A1c  Chronic thrombocytopenia: Relatively stable. -Continue monitoring  Debility/physical deconditioning -PT/OT-she refuses SNF.  Body mass index is 37.25 kg/m.        Stage II sacral pressure ulcer: POA. -Continue wound care Pressure Injury 03/03/20 Sacrum Mid Stage 2 -  Partial thickness loss of dermis presenting as a shallow open injury with a red, pink wound bed without slough. (Active)  03/03/20 0226  Location: Sacrum  Location Orientation: Mid  Staging: Stage 2 -  Partial thickness loss of dermis presenting as a shallow open injury with a red, pink wound bed without slough.  Wound Description (Comments):   Present on Admission: Yes   DVT prophylaxis:  Place TED hose Start: 03/17/20 1141 apixaban (ELIQUIS) tablet 2.5 mg Start: 03/03/20 1000 apixaban (ELIQUIS) tablet 2.5 mg  Code Status: Full code Family Communication: Patient and/or RN. Available if any question.  Status is: Inpatient  Remains inpatient appropriate because:Hemodynamically unstable, Persistent severe electrolyte disturbances, Unsafe d/c plan and Inpatient level of care appropriate due to severity of illness Patient with orthostasis, worsening renal function and hyperkalemia  Dispo: The patient is from: Home              Anticipated d/c is to: Home              Anticipated d/c date is: 1 day              Patient currently is not medically stable to d/c.       Consultants:    Cardiology-signed off   Sch Meds:  Scheduled Meds: . amiodarone  200 mg Oral Daily  . apixaban  2.5 mg Oral BID  . polyethylene glycol  17 g Oral BID  . rosuvastatin  10 mg Oral Daily  . sodium chloride flush  3 mL Intravenous Q12H   Continuous Infusions: . sodium chloride     PRN Meds:.sodium chloride, acetaminophen, docusate sodium, ondansetron (ZOFRAN) IV, sodium chloride flush  Antimicrobials: Anti-infectives (From admission, onward)   None       I have personally reviewed the following labs and images: CBC: Recent Labs  Lab 03/13/20 0836 03/14/20 0153 03/16/20 1010 03/17/20 0140  WBC 5.5 5.4 5.0 5.1  NEUTROABS  --  3.8  --   --   HGB 13.2 12.5 12.5 11.2*  HCT 41.1 40.2 40.0  35.5*  MCV 101.5* 101.5* 102.8* 101.4*  PLT 105* 103* 107* 91*   BMP &GFR Recent Labs  Lab 03/11/20 0146 03/12/20 0057 03/14/20 0153 03/16/20 1010 03/17/20 0140  NA 135 137 142 136 137  K 4.6 4.1 5.0 5.5* 5.4*  CL 90* 93* 102 100 103  CO2 36* 33* 33* 28 27  GLUCOSE 144* 133* 131* 201* 164*  BUN 66* 67* 67* 58* 60*  CREATININE 1.71* 2.14* 2.22* 2.05* 2.28*  CALCIUM 9.3 9.3 9.6 9.9 9.7  MG  --  1.9  --  2.3 2.2  PHOS  --   --   --  3.2 3.2   Estimated Creatinine Clearance: 20.9 mL/min (A) (by C-G formula based on SCr of 2.28 mg/dL (H)). Liver & Pancreas: Recent Labs  Lab 03/16/20 1010 03/17/20 0140  ALBUMIN 3.1* 2.8*   No results for input(s): LIPASE, AMYLASE in the last 168 hours. No results for input(s): AMMONIA in the last 168 hours. Diabetic: No results for input(s): HGBA1C in the last 72 hours. No results for input(s): GLUCAP in the last 168 hours. Cardiac Enzymes: No results for input(s): CKTOTAL, CKMB, CKMBINDEX, TROPONINI in the last 168 hours. No results for input(s): PROBNP in the last 8760 hours. Coagulation Profile: No results for input(s): INR, PROTIME in the last 168 hours. Thyroid Function Tests: No results for input(s): TSH, T4TOTAL, FREET4, T3FREE,  THYROIDAB in the last 72 hours. Lipid Profile: No results for input(s): CHOL, HDL, LDLCALC, TRIG, CHOLHDL, LDLDIRECT in the last 72 hours. Anemia Panel: No results for input(s): VITAMINB12, FOLATE, FERRITIN, TIBC, IRON, RETICCTPCT in the last 72 hours. Urine analysis:    Component Value Date/Time   COLORURINE YELLOW 03/02/2020 2151   APPEARANCEUR CLEAR 03/02/2020 2151   LABSPEC 1.009 03/02/2020 2151   PHURINE 5.0 03/02/2020 2151   GLUCOSEU NEGATIVE 03/02/2020 2151   HGBUR NEGATIVE 03/02/2020 2151   BILIRUBINUR NEGATIVE 03/02/2020 2151   [place] NEGATIVE 03/02/2020 2151   PROTEINUR NEGATIVE 03/02/2020 2151   UROBILINOGEN 1.0 07/28/2013 1305   NITRITE NEGATIVE 03/02/2020 2151   LEUKOCYTESUR NEGATIVE 03/02/2020 2151   Sepsis Labs: Invalid input(s): PROCALCITONIN, [place]  Microbiology: No results found for this or any previous visit (from the past 240 hour(s)).  Radiology Studies: No results found.    Carrie Monroe T. Lynn  If 7PM-7AM, please contact night-coverage [email or web address] 03/17/2020, 3:30 PM

## 2020-03-17 NOTE — Progress Notes (Signed)
Mobility Specialist: Progress Note   03/17/20 1811  Mobility  Activity Ambulated in hall  Level of Assistance Minimal assist, patient does 75% or more  Assistive Device Front wheel walker  Distance Ambulated (ft) 90 ft  Mobility Response Tolerated well  Mobility performed by Mobility specialist  Bed Position Semi-fowlers  $Mobility charge 1 Mobility   Pre-Mobility: 60 HR, 120/47 BP, 90% SpO2 Post-Mobility: 66 HR, 140/60 BP, 94% SpO2  Pt c/o dizziness upon entering room. Pt said her dizziness was the same upon standing and during ambulation and wanted to continue. Pt had no c/o pain.   [facility] Nethaniel Mattie Mobility Specialist

## 2020-03-17 NOTE — Plan of Care (Signed)
  Problem: Clinical Measurements: Goal: Respiratory complications will improve Outcome: Progressing   

## 2020-03-17 NOTE — Progress Notes (Signed)
Progress Note  Patient Name: Carrie Monroe Date of Encounter: 03/17/2020  [facility] Cardiologist: Sanda Klein, MD   Subjective   Still dizzy with upright position, but otherwise improved. No dyspnea or edema. Maintaining sinus rhythm, HR in 40s while asleep, 55-65 when awake. DBP remains quite low, at times in 40s.  Inpatient Medications    Scheduled Meds: . amiodarone  200 mg Oral Daily  . apixaban  2.5 mg Oral BID  . polyethylene glycol  17 g Oral BID  . rosuvastatin  10 mg Oral Daily  . sodium chloride flush  3 mL Intravenous Q12H   Continuous Infusions: . sodium chloride     PRN Meds: sodium chloride, acetaminophen, docusate sodium, ondansetron (ZOFRAN) IV, sodium chloride flush   Vital Signs    Vitals:   03/17/20 0418 03/17/20 0425 03/17/20 0500 03/17/20 0720  BP: (!) 141/55   (!) 135/54  Pulse: 62   61  Resp: (!) 27 20 20 18   Temp: 98.7 F (37.1 C)   97.9 F (36.6 C)  TempSrc: Oral   Oral  SpO2: 94%   91%  Weight:   95.4 kg   Height:        Intake/Output Summary (Last 24 hours) at 03/17/2020 0939 Last data filed at 03/17/2020 0640 Gross per 24 hour  Intake 460 ml  Output 831 ml  Net -371 ml   Last 3 Weights 03/17/2020 03/16/2020 03/15/2020  Weight (lbs) 210 lb 4.8 oz 211 lb 4.8 oz 208 lb 1.6 oz  Weight (kg) 95.391 kg 95.845 kg 94.394 kg      Telemetry    Sinus bradycardia - Personally Reviewed  ECG    No new tracing - Personally Reviewed  Physical Exam  Obese GEN: No acute distress.   Neck: No JVD Cardiac: RRR, no murmurs, rubs, or gallops.  Respiratory: Clear to auscultation bilaterally. GI: Soft, nontender, non-distended  MS: No edema; No deformity. Neuro:  Nonfocal  Psych: Normal affect   Labs    High Sensitivity Troponin:   Recent Labs  Lab 03/02/20 1955 03/03/20 0327  TROPONINIHS 54* 52*      Chemistry Recent Labs  Lab 03/14/20 0153 03/16/20 1010 03/17/20 0140  NA 142 136 137  K 5.0 5.5* 5.4*  CL  102 100 103  CO2 33* 28 27  GLUCOSE 131* 201* 164*  BUN 67* 58* 60*  CREATININE 2.22* 2.05* 2.28*  CALCIUM 9.6 9.9 9.7  ALBUMIN  --  3.1* 2.8*  GFRNONAA 20* 22* 19*  ANIONGAP 7 8 7      Hematology Recent Labs  Lab 03/14/20 0153 03/16/20 1010 03/17/20 0140  WBC 5.4 5.0 5.1  RBC 3.96 3.89 3.50*  HGB 12.5 12.5 11.2*  HCT 40.2 40.0 35.5*  MCV 101.5* 102.8* 101.4*  MCH 31.6 32.1 32.0  MCHC 31.1 31.3 31.5  RDW 16.1* 16.1* 16.0*  PLT 103* 107* 91*    BNPNo results for input(s): BNP, PROBNP in the last 168 hours.   DDimer No results for input(s): DDIMER in the last 168 hours.   Radiology    No results found.  Cardiac Studies   Echo06/27/2021  1. Left ventricular ejection fraction, by estimation, is 40 to 45%. The  left ventricle has mildly decreased function. The left ventricle  demonstrates global hypokinesis. There is mild concentric left ventricular  hypertrophy. Left ventricular diastolic  function could not be evaluated.  2. Right ventricular systolic function is mildly reduced. The right  ventricular size is mildly enlarged.  There is moderately elevated  pulmonary artery systolic pressure. The estimated right ventricular  systolic pressure is 39.7 mmHg.  3. Left atrial size was severely dilated.  4. Right atrial size was severely dilated.  5. The mitral valve is normal in structure. Mild to moderate mitral valve  regurgitation.  6. The aortic valve has been repaired/replaced. Aortic valve  regurgitation is not visualized. There is a 26 mm Medtronic  CoreValve-EvolutR prosthetic (TAVR) valve present in the aortic position.  Procedure Date: September 2018. Echo findings are  consistent with normal structure and function of the aortic valve  prosthesis.  7. The inferior vena cava is dilated in size with <50% respiratory  variability, suggesting right atrial pressure of 15 mmHg.   Comparison(s): Changes from prior study are noted. The left ventricular    function is worsened.    Patient Profile     84 y.o. female admitted to the hospital for decompensated heart failure and atrial fibrillation with rapid ventricular response. History of severe AS status post TAVR 01/2017 with 26 mm Medtronic CoreValve evolute pro, CAD with multivessel PCI in 2018, heart failure with reduced ejection fraction EF 40 to 45%, persistent atrial fibrillation on Eliquis, breast cancer status post lumpectomy and history of radiation, DM2, hypertension, hyperlipidemia. DCCV 03/15/2020 to moderate sinus bradycardia.  Assessment & Plan    1. Atrial Fibrillation:maintaining sinus bradycardia after DCCV, on amiodarone 200 mg daily.She has severe biatrial dilation and her odds of long-term maintenance of sinus rhythm remainlow.CHA2DS2-VASc 5(age 5, gender, CHF, CAD). 2. Bradycardia due to necessary meds: may eventually need a pacemaker, but would like to allow her to reach a steady state and re-evaluate symptoms and LVEF. If a pacemaker does become indicated, we need to consider CRT pacing due to her broad IVCD. 2. Acute onChronic Systolic Heart Failure:closer to target weight of 212-216 lb.Minimal worsening noted in EF from prior TTE in 01/2019 (EF 45-50% -->EF 40-45%). 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 Will restart furosemide 40 mg once daily to be taken only on days that weight is 216 lb or higher (with KCl supplement on those days only). 3. CAD:S/p DES of the LAD and RCA in 2018.No angina, even with RVR. 4. HLP:On statin, with most recent LDL cholesterol 67. 5. S/P TAVR:normal prosthesis function by recent echo. 6. Acute onCKD 3b:Creatabove baseline after diuresis, but now improving. Resume diuretic tomorrow. Creatinine has been 1.36-1.71 over the last few months.Suspect she will need furosemide 80 mg daily, but wait until weight 212 lb and creat  back to baseline. K high - stop the supplement. 7. Chronic thrombocytopenia:stable around 100K. No bleeding. 8. Diastolic hypotension:stop metoprolol. May be reason for her dizziness. May need midodrine in the future. [facility] will sign off.   Medication Recommendations:   - amiodarone 200 mg daily - furosemide 40 mg only on days with weight 216 lb or over - KCl 20 mEq daily, on days when taking furosemide  - eliquis 2.5 mg twice daily - rosuvastatin 10 mg daily Stop metoprolol Other recommendations (labs, testing, etc):  BMET in 2 weeks at f/u appt Follow up as an outpatient:  TOC 2 weeks, see me in 2 months  For questions or updates, please contact [place] Please consult [email or web address] for contact info under        Signed, Sanda Klein, MD  03/17/2020, 9:39 AM

## 2020-03-17 NOTE — Progress Notes (Signed)
Occupational Therapy Treatment Patient Details Name: Carrie Monroe MRN: [id number] DOB: 1936-01-02 Today's Date: 03/17/2020    History of present illness 84 y.o. female presenting with weight gain, worsening of chronic BLE edema, and progressive dyspnea. Patient admitted with acute on chronic systolic CHF. Previous admission 11/2019 with new onset afib w/ RVR and acute on chronic CHF. PMH includes: 3-vessel CAD s/p DES PCI (2018), severe AS s/p TAVR (2018), chronic HFpEF, HTN, HLD, DMII, and CKD stage IIIb.    OT comments  Patient with mild complaint of dizziness this date.  Generalized weakness, decreased activity tolerance and declines to stand balance continue to effect ADL, toilet, and functional mobility independence.  Patient is now looking to return home, with initial assist from daughter, then the son/daughter in law, and [facility] services.  Patient is on room air.  She did de-sat to 84% with mobility to the toilet, and a light grooming task.  Discussed rest break and pursed lip breathing - one minute and patient rebounded to 94%.  OT will continue to follow in the acute setting.    Follow Up Recommendations  Supervision/Assistance - 24 hour;Home health OT    Equipment Recommendations       Recommendations for Other Services      Precautions / Restrictions Precautions Precautions: Fall Precaution Comments: Off O2, but still desats with exerction. Restrictions Weight Bearing Restrictions: No       Mobility                   Transfers     Transfers: Sit to/from Stand Sit to Stand: Supervision              Balance           Standing balance support: Bilateral upper extremity supported Standing balance-Leahy Scale: Fair Standing balance comment: Pt was able to steady herself with UE support and RW.                             ADL either performed or assessed with clinical judgement   ADL       Grooming: Wash/dry hands;Wash/dry face;Oral  care;Brushing hair;Set up;Standing                   Toilet Transfer: Supervision/safety;RW;Ambulation Armed forces technical officer Details (indicate cue type and reason): continues with mild dizziness and SOB Toileting- Clothing Manipulation and Hygiene: Supervision/safety;Sit to/from stand       Functional mobility during ADLs: Rolling walker;Supervision/safety                         Cognition Arousal/Alertness: Awake/alert Behavior During Therapy: WFL for tasks assessed/performed Overall Cognitive Status: Within Functional Limits for tasks assessed                                                            Pertinent Vitals/ Pain       Pain Assessment: No/denies pain Pain Intervention(s): Repositioned  Frequency  Min 2X/week        Progress Toward Goals  OT Goals(current goals can now be found in the care plan section)  Progress towards OT goals: Progressing toward goals  Acute Rehab OT Goals Patient Stated Goal: Hoping to get stronger and be able to cook and clean again. OT Goal Formulation: With patient Time For Goal Achievement: 03/20/20 Potential to Achieve Goals: Good  Plan Discharge plan remains appropriate    Co-evaluation                 AM-PAC OT "6 Clicks" Daily Activity     Outcome Measure   Help from another person eating meals?: None Help from another person taking care of personal grooming?: None Help from another person toileting, which includes using toliet, bedpan, or urinal?: None Help from another person bathing (including washing, rinsing, drying)?: A Lot Help from another person to put on and taking off regular upper body clothing?: A Little Help from another person to put on and taking off regular lower body clothing?: A Lot 6 Click Score: 19    End of Session  In recliner with needs in reach  OT Visit Diagnosis:  Unsteadiness on feet (R26.81);Muscle weakness (generalized) (M62.81)   Activity Tolerance Patient tolerated treatment well   Patient Left in chair;with call bell/phone within reach   Nurse Communication          Time: 3299-2426 OT Time Calculation (min): 17 min  Charges: OT General Charges $OT Visit: 1 Visit OT Treatments $Self Care/Home Management : 8-22 mins  03/17/2020  Rich, OTR/L  Acute Rehabilitation Services  Office:  [phone]    Metta Clines 03/17/2020, 11:54 AM

## 2020-03-17 NOTE — TOC Transition Note (Signed)
Transition of Care (TOC) - CM/SW Discharge Note Marvetta Gibbons RN, BSN Transitions of Care Unit 4E- RN Case Manager See Treatment Team for direct phone #    Patient Details  Name: Carrie Monroe MRN: [id number] Date of Birth: July 19, 1935  Transition of Care [facility]) CM/SW Contact:  Dawayne Patricia, RN Phone Number: 03/17/2020, 12:52 PM   Clinical Narrative:    Per MD possible d/c on 10/22- pt has decided to return home with [facility] and not go to SNF for rehab. Pt has son/DIL here to assist and another daughter coming from [place]. Orders have been placed for HHRN/PT/OT/aide- as per previous conversation with pt - pt had chosen [facility] for [facility] needs- call made to [place] with [facility] for [facility] referral- referral has been accepted for [facility] needs- [facility] will f/u within 48 hr post discharge for start of care.  Spoke with pt at bedside to let her know [facility] services were secured with [facility]- confirmed with pt that she has a RW and other DME at home- no DME needs for discharge noted.    Final next level of care: [place] Barriers to Discharge: Barriers Resolved   Patient Goals and CMS Choice Patient states their goals for this hospitalization and ongoing recovery are:: return home and get stronger CMS Medicare.gov Compare Post Acute Care list provided to:: Patient Choice offered to / list presented to : Patient  Discharge Placement                 Home with [facility]      Discharge Plan and Services   Discharge Planning Services: CM Consult Post Acute Care Choice: Home Health          DME Arranged: N/A DME Agency: NA       HH Arranged: RN, Disease Management, PT, OT, Nurse's Aide [place] Agency: [facility] Date [place]: 03/17/20 Time [place]: [id number] Representative spoke with at [place]: [place]) Interventions     Readmission Risk Interventions Readmission Risk Prevention Plan 03/17/2020   Transportation Screening Complete  PCP or Specialist Appt within 3-5 Days Complete  HRI or [place] Complete  Social Work Consult for [place] Planning/Counseling Complete  Palliative Care Screening Not Applicable  Medication Review Press photographer) Complete  Some recent data might be hidden

## 2020-03-18 ENCOUNTER — Other Ambulatory Visit (HOSPITAL_COMMUNITY): Payer: Self-pay | Admitting: Student

## 2020-03-18 DIAGNOSIS — R5381 Other malaise: Secondary | ICD-10-CM

## 2020-03-18 DIAGNOSIS — L8941 Pressure ulcer of contiguous site of back, buttock and hip, stage 1: Secondary | ICD-10-CM

## 2020-03-18 DIAGNOSIS — R7989 Other specified abnormal findings of blood chemistry: Secondary | ICD-10-CM

## 2020-03-18 LAB — HEMOGLOBIN A1C
Hgb A1c MFr Bld: 6.3 % — ABNORMAL HIGH (ref 4.8–5.6)
Mean Plasma Glucose: 134 mg/dL

## 2020-03-18 LAB — CBC
HCT: 33.7 % — ABNORMAL LOW (ref 36.0–46.0)
Hemoglobin: 10.8 g/dL — ABNORMAL LOW (ref 12.0–15.0)
MCH: 32.2 pg (ref 26.0–34.0)
MCHC: 32 g/dL (ref 30.0–36.0)
MCV: 100.6 fL — ABNORMAL HIGH (ref 80.0–100.0)
Platelets: 88 10*3/uL — ABNORMAL LOW (ref 150–400)
RBC: 3.35 MIL/uL — ABNORMAL LOW (ref 3.87–5.11)
RDW: 15.9 % — ABNORMAL HIGH (ref 11.5–15.5)
WBC: 4.2 10*3/uL (ref 4.0–10.5)
nRBC: 0 % (ref 0.0–0.2)

## 2020-03-18 LAB — GLUCOSE, CAPILLARY
Glucose-Capillary: 134 mg/dL — ABNORMAL HIGH (ref 70–99)
Glucose-Capillary: 231 mg/dL — ABNORMAL HIGH (ref 70–99)

## 2020-03-18 LAB — RENAL FUNCTION PANEL
Albumin: 2.7 g/dL — ABNORMAL LOW (ref 3.5–5.0)
Anion gap: 8 (ref 5–15)
BUN: 55 mg/dL — ABNORMAL HIGH (ref 8–23)
CO2: 26 mmol/L (ref 22–32)
Calcium: 9.4 mg/dL (ref 8.9–10.3)
Chloride: 103 mmol/L (ref 98–111)
Creatinine, Ser: 2.06 mg/dL — ABNORMAL HIGH (ref 0.44–1.00)
GFR, Estimated: 24 mL/min — ABNORMAL LOW (ref >60)
Glucose, Bld: 153 mg/dL — ABNORMAL HIGH (ref 70–99)
Phosphorus: 3.5 mg/dL (ref 2.5–4.6)
Potassium: 4.5 mmol/L (ref 3.5–5.1)
Sodium: 137 mmol/L (ref 135–145)

## 2020-03-18 LAB — MAGNESIUM: Magnesium: 2.3 mg/dL (ref 1.7–2.4)

## 2020-03-18 MED ORDER — FUROSEMIDE 40 MG PO TABS
40.0000 mg | ORAL_TABLET | Freq: Every day | ORAL | 2 refills | Status: DC | PRN
Start: 2020-03-18 — End: 2020-04-07

## 2020-03-18 MED ORDER — AMIODARONE HCL 200 MG PO TABS
200.0000 mg | ORAL_TABLET | Freq: Every day | ORAL | 1 refills | Status: DC
Start: 2020-03-18 — End: 2020-04-07

## 2020-03-18 MED FILL — AMIODARONE HCL 200 MG TABS: 200 | 90 days supply | Qty: 90 | Fill #0

## 2020-03-18 NOTE — Progress Notes (Signed)
Discharge instructions (including medications) discussed with and copy provided to patient/caregiver 

## 2020-03-18 NOTE — Progress Notes (Signed)
Physical Therapy Treatment Patient Details Name: Carrie Monroe MRN: [id number] DOB: Oct 02, 1935 Today's Date: 03/18/2020    History of Present Illness 84 y.o. female presenting with weight gain, worsening of chronic BLE edema, and progressive dyspnea. Patient admitted with acute on chronic systolic CHF. Previous admission 11/2019 with new onset afib w/ RVR and acute on chronic CHF. PMH includes: 3-vessel CAD s/p DES PCI (2018), severe AS s/p TAVR (2018), chronic HFpEF, HTN, HLD, DMII, and CKD stage IIIb.     PT Comments    Pt seated in recliner.  She required decreased assistance but continues to require supervision for mobility.  Pt continues to benefit from snf placement.  Pt refusing snf placement and will require HHPT at d/c.  Issued HEP for home use.      Follow Up Recommendations  SNF;Supervision/Assistance - 24 hour     Equipment Recommendations       Recommendations for Other Services       Precautions / Restrictions Precautions Precautions: Fall Precaution Comments: Off O2, but still desats with exerction. Restrictions Weight Bearing Restrictions: No    Mobility  Bed Mobility               General bed mobility comments: Pt seated in recliner on arrival this session.  Transfers Overall transfer level: Modified independent Equipment used: Rolling walker (2 wheeled) Transfers: Sit to/from Stand Sit to Stand: Modified independent (Device/Increase time)            Ambulation/Gait Ambulation/Gait assistance: Supervision Gait Distance (Feet): 80 Feet Assistive device: Rolling walker (2 wheeled) Gait Pattern/deviations: Wide base of support;Trunk flexed;Step-through pattern;Decreased stride length Gait velocity: reduced   General Gait Details: Cues for upper trunk control.  Pt with increased WOB and desat noted to 84% on RA, with seated break she recovered to 90%.   Stairs             Wheelchair Mobility    Modified Rankin (Stroke  Patients Only)       Balance Overall balance assessment: Needs assistance Sitting-balance support: No upper extremity supported;Feet supported Sitting balance-Leahy Scale: Good     Standing balance support: Bilateral upper extremity supported Standing balance-Leahy Scale: Fair                              Cognition Arousal/Alertness: Awake/alert Behavior During Therapy: WFL for tasks assessed/performed Overall Cognitive Status: Within Functional Limits for tasks assessed                                        Exercises Total Joint Exercises Ankle Circles/Pumps: AROM;Both;10 reps;Supine Quad Sets: AROM;Both;10 reps;Supine Heel Slides: AROM;Both;10 reps;Supine Hip ABduction/ADduction: AROM;Both;10 reps;Supine Straight Leg Raises: AROM;Both;5 reps;Supine Long Arc Quad: AROM;Both;10 reps;Seated General Exercises - Lower Extremity Hip Flexion/Marching: AROM;Both;10 reps;Seated    General Comments        Pertinent Vitals/Pain Pain Assessment: No/denies pain    Home Living                      Prior Function            PT Goals (current goals can now be found in the care plan section) Acute Rehab PT Goals Patient Stated Goal: Hoping to get stronger and be able to cook and clean again. Potential to Achieve Goals: Good Progress towards PT goals:  Progressing toward goals    Frequency    Min 3X/week      PT Plan Current plan remains appropriate    Co-evaluation              AM-PAC PT "6 Clicks" Mobility   Outcome Measure  Help needed turning from your back to your side while in a flat bed without using bedrails?: A Little Help needed moving from lying on your back to sitting on the side of a flat bed without using bedrails?: A Little Help needed moving to and from a bed to a chair (including a wheelchair)?: None Help needed standing up from a chair using your arms (e.g., wheelchair or bedside chair)?: None Help  needed to walk in hospital room?: A Little Help needed climbing 3-5 steps with a railing? : A Little 6 Click Score: 20    End of Session Equipment Utilized During Treatment: Gait belt Activity Tolerance: Patient tolerated treatment well Patient left: in chair;with call bell/phone within reach;with chair alarm set Nurse Communication: Mobility status PT Visit Diagnosis: Unsteadiness on feet (R26.81);Muscle weakness (generalized) (M62.81)     Time: 5909-3112 PT Time Calculation (min) (ACUTE ONLY): 29 min  Charges:  $Gait Training: 8-22 mins $Therapeutic Exercise: 8-22 mins                     Erasmo Leventhal , PTA Acute Rehabilitation Services Pager [phone] Office [phone]     Carrie Monroe 03/18/2020, 10:38 AM

## 2020-03-18 NOTE — Plan of Care (Signed)
  Problem: Education: Goal: Knowledge of General Education information will improve Description: Including pain rating scale, medication(s)/side effects and non-pharmacologic comfort measures Outcome: Adequate for Discharge   

## 2020-03-18 NOTE — Discharge Summary (Signed)
Physician Discharge Summary  Carrie Monroe DJS:[id number] DOB: December 23, 1935 DOA: 03/02/2020  PCP: Marton Redwood, MD  Admit date: 03/02/2020 Discharge date: 03/18/2020  Admitted From: Home Disposition: Home  Recommendations for Outpatient Follow-up:  1. Follow ups as below. 2. Please obtain CBC/BMP/Mag at follow up 3. Please follow up on the following pending results: None  Home Health: PT/OT/RN/aide Equipment/Devices: Rolling walker  Discharge Condition: Stable CODE STATUS: Full code   [place] Follow up.   Specialty: [place] Why: HHRN/PT/OT/aide arranged- they will call to set up home visits within 48 post discharge Contact information: [place] [address] [phone]        Marton Redwood, MD. Schedule an appointment as soon as possible for a visit in 1 week(s).   Specialty: Internal Medicine Contact information: [address] [place] [address] [phone]        Croitor[place], Dani Gobble, MD. Schedule an appointment as soon as possible for a visit in 2 week(s).   Specialty: Cardiology Contact information: [address] [place] [address] [phone]               Hospital Course: 84 year old F with PMH of A. fib, systolic CHF, severe AS s/p TAVR and CKD-3B presenting with progressive BLE edema, orthopnea, DOE and about 11 pound weight gain despite increased p.o. diuretics at home.  She was admitted for acute hypoxemic respiratory failure due to acute on chronic systolic CHF and A. fib with RVR.   In ED, in A. fib with RVR to 130s.  Desaturated to 80% requiring 2 L by [place]. JVD on exam. Cr 2.79.  BUN 63.  BNP 529.  COVID-19 PCR negative.  CXR with bilateral interstitial infiltrates and small bilateral pleural effusions.  Patient was started on Cardizem drip and IV diuretics.  Cardiology consulted.  In regards to A. fib with RVR, patient  underwent DCCV on 03/15/2020.  He is on amiodarone and Eliquis.  Now rate controlled.  In regards to CHF, diuresed with IV Lasix.  Developed some AKI, which has improved after holding diuretics.  She also had orthostasis that has improved after discontinuing metoprolol and applying TED hose.   Cardiology recommended p.o. Lasix 40 mg daily as needed for weight over 216 pounds on discharge.  She is discharged on p.o. amiodarone and low-dose Eliquis for A. Fib.  BMP and cardiology follow-up in 2 weeks.  See individual problem list below for more on hospital course.  Discharge Diagnoses:  Acute on chronic systolic CHF: Echo in 10/7207 with EF 40-45%, global hypokinesis, RVSP of 61 mmHg, severe LAE and severe RAE, and normal TAVR.  Diuresed with IV Lasix which is now on hold due to AKI and orthostasis.  Net -24 L.  She had about 1 L UOP/24 hours without diuretics.  Low DBP. -Appreciate cardiology recs: - amiodarone 200 mg daily - furosemide 40 mg only on days with weight 216 lb or over - eliquis 2.5 mg twice daily - rosuvastatin 10 mg daily  -Stop metoprolol             -BMET in 2 weeks at f/u appt             -outpt f/up: TOC 2 weeks            Paroxysmal atrial fibrillation with RVR: RVR resolved.  Now bradycardic to 50s and lower 60s.  S/p DCCV on 10/19.  CHA2DS2-VASc score 5 -On amiodarone and Eliquis per cardiology.  Orthostasis-resolved after discontinuing metoprolol and applying TED hose. Diastolic hypotension: DBP in 40s and 50s. -Cardiac meds as above -TED hose  Elevated troponin-likely demand ischemia from CHF and A. fib.  No chest pain. History of CAD s/p DES to LAD and RCA in 2018-stable. -Continue statin.  History of AS s/p TAVR: Stable  AKI on CKD-3A/azotemia: Baseline Cr 1.3-1.7>>> 2.75 (admit)> 2.05> 2.28> 2.06.  BUN 58> 60> 55. -Recheck BMP in 2 weeks  Hyperkalemia: K5.5> 5.4> 4.5 -Recheck in 2 weeks  Hyperglycemia without diabetes: A1c 5.4%.  BMP glucose  elevated.  Chronic thrombocytopenia: Relatively stable.  Debility/physical deconditioning -PT/OT-she refuses SNF.  Home health as above  Body mass index is 37.25 kg/m.    Stage I sacral pressure ulcer: POA.  Not stage II.    Body mass index is 38.01 kg/m.        Discharge Exam: Vitals:   03/18/20 0632 03/18/20 0722  BP: (!) 130/54 (!) 136/50  Pulse: (!) 56 (!) 58  Resp: 18 20  Temp: 98.1 F (36.7 C) 98.1 F (36.7 C)  SpO2: 93% 93%    GENERAL: No apparent distress.  Nontoxic. HEENT: MMM.  Vision and hearing grossly intact.  NECK: Supple.  No apparent JVD.  RESP:  No IWOB.  Fair aeration bilaterally. CVS:  RRR. Heart sounds normal.  ABD/GI/GU: Bowel sounds present. Soft. Non tender.  MSK/EXT:  Moves extremities. No apparent deformity. No edema.  SKIN: Stage I pressure skin injury over the sacral area. NEURO: Awake, alert and oriented appropriately.  No apparent focal neuro deficit. PSYCH: Calm. Normal affect.  Discharge Instructions  Discharge Instructions    Diet - low sodium heart healthy   Complete by: As directed    Discharge instructions   Complete by: As directed    It has been a pleasure taking care of you!  You were hospitalized due to leg edema, difficulty breathing and weight gain.  You were treated for heart failure exacerbation and atrial fibrillation.  Your symptoms improved after the treatment.  We have made changes to your heart medications.  Please review your new medication list and the directions on your medications before you take them.  Please follow-up with your primary care doctor and cardiologist as recommended.  In regards to your heart failure,  it is important that you take your medications as prescribed, avoid alcohol or over-the-counter pain medication other than plain Tylenol, limit the amount of water/fluid you drink to less than 6 cups (1500 cc) a day,  limit your sodium (salt) intake to less than 2 g (2000 mg) a day and weigh  yourself daily at the same time and keeping your weight log.       Take care,   Increase activity slowly   Complete by: As directed    No wound care   Complete by: As directed      Allergies as of 03/18/2020      Reactions   Oxycodone Other (See Comments)   Hallunication      Medication List    STOP taking these medications   metoprolol tartrate 50 MG tablet Commonly known as: LOPRESSOR     TAKE these medications   amiodarone 200 MG tablet Commonly known as: PACERONE Take 1 tablet (200 mg total) by mouth daily.   apixaban 2.5 MG Tabs tablet Commonly known as: ELIQUIS Take 1 tablet (2.5 mg total) by mouth 2 (two) times daily.   colchicine  0.6 MG tablet Take 0.6 mg by mouth daily as needed (gout flare).   COLLAGEN PO Take 5 g by mouth daily. power   ferrous sulfate 325 (65 FE) MG EC tablet Take 325 mg by mouth daily.   furosemide 40 MG tablet Commonly known as: LASIX Take 1 tablet (40 mg total) by mouth daily as needed (weight > 216 lbs). What changed:   how much to take  when to take this  reasons to take this   rosuvastatin 10 MG tablet Commonly known as: CRESTOR Take 10 mg by mouth daily.   VITAMIN B 12 PO Take 1 Dose by mouth daily. liquid   vitamin C 1000 MG tablet Take 1,000 mg by mouth daily.   Vitamin D 50 MCG (2000 UT) tablet Take 2,000 Units by mouth daily.   ZINC PO Take by mouth. Uses a dropper- takes Liquid form            Durable Medical Equipment  (From admission, onward)         Start     Ordered   03/16/20 2158  For home use only DME Walker rolling  Once       Question Answer Comment  Walker: With [place] Wheels   Patient needs a walker to treat with the following condition Generalized weakness      03/16/20 2158          Consultations:  Cardiology  Procedures/Studies:  03/15/2020-DCCV for A. fib   DG Chest Portable 1 View  Result Date: 03/02/2020 CLINICAL DATA:  Shortness of breath. EXAM: PORTABLE  CHEST 1 VIEW COMPARISON:  Radiograph 11/27/2019 FINDINGS: Stable cardiomegaly. Unchanged mediastinal contours. Prior TAVR. Interstitial thickening is suspicious for pulmonary edema. Hazy lung base opacities likely represent small effusions and atelectasis. No pneumothorax or confluent consolidation. Chronic change of both shoulders. IMPRESSION: 1. Cardiomegaly with interstitial thickening suspicious for pulmonary edema. 2. Hazy lung base opacities likely represent small effusions and atelectasis. Electronically Signed   By: Keith Rake M.D.   On: 03/02/2020 20:02        The results of significant diagnostics from this hospitalization (including imaging, microbiology, ancillary and laboratory) are listed below for reference.     Microbiology: No results found for this or any previous visit (from the past 240 hour(s)).   Labs: BNP (last 3 results) Recent Labs    04/27/19 2338 11/21/19 2033 03/02/20 1936  BNP 350.0* 278.1* 628.3*   Basic Metabolic Panel: Recent Labs  Lab 03/12/20 0057 03/14/20 0153 03/16/20 1010 03/17/20 0140 03/18/20 0032  NA 137 142 136 137 137  K 4.1 5.0 5.5* 5.4* 4.5  CL 93* 102 100 103 103  CO2 33* 33* 28 27 26   GLUCOSE 133* 131* 201* 164* 153*  BUN 67* 67* 58* 60* 55*  CREATININE 2.14* 2.22* 2.05* 2.28* 2.06*  CALCIUM 9.3 9.6 9.9 9.7 9.4  MG 1.9  --  2.3 2.2 2.3  PHOS  --   --  3.2 3.2 3.5   Liver Function Tests: Recent Labs  Lab 03/16/20 1010 03/17/20 0140 03/18/20 0032  ALBUMIN 3.1* 2.8* 2.7*   No results for input(s): LIPASE, AMYLASE in the last 168 hours. No results for input(s): AMMONIA in the last 168 hours. CBC: Recent Labs  Lab 03/13/20 0836 03/14/20 0153 03/16/20 1010 03/17/20 0140 03/18/20 0032  WBC 5.5 5.4 5.0 5.1 4.2  NEUTROABS  --  3.8  --   --   --   HGB 13.2 12.5 12.5 11.2*  10.8*  HCT 41.1 40.2 40.0 35.5* 33.7*  MCV 101.5* 101.5* 102.8* 101.4* 100.6*  PLT 105* 103* 107* 91* 88*   Cardiac Enzymes: No results for  input(s): CKTOTAL, CKMB, CKMBINDEX, TROPONINI in the last 168 hours. BNP: Invalid input(s): POCBNP CBG: Recent Labs  Lab 03/17/20 1612 03/17/20 2143 03/18/20 0635  GLUCAP 158* 163* 134*   D-Dimer No results for input(s): DDIMER in the last 72 hours. Hgb A1c Recent Labs    03/17/20 0140  HGBA1C 6.3*   Lipid Profile No results for input(s): CHOL, HDL, LDLCALC, TRIG, CHOLHDL, LDLDIRECT in the last 72 hours. Thyroid function studies No results for input(s): TSH, T4TOTAL, T3FREE, THYROIDAB in the last 72 hours.  Invalid input(s): FREET3 Anemia work up No results for input(s): VITAMINB12, FOLATE, FERRITIN, TIBC, IRON, RETICCTPCT in the last 72 hours. Urinalysis    Component Value Date/Time   COLORURINE YELLOW 03/02/2020 2151   APPEARANCEUR CLEAR 03/02/2020 2151   LABSPEC 1.009 03/02/2020 2151   PHURINE 5.0 03/02/2020 2151   GLUCOSEU NEGATIVE 03/02/2020 2151   HGBUR NEGATIVE 03/02/2020 2151   BILIRUBINUR NEGATIVE 03/02/2020 2151   KETONESUR NEGATIVE 03/02/2020 2151   PROTEINUR NEGATIVE 03/02/2020 2151   UROBILINOGEN 1.0 07/28/2013 1305   NITRITE NEGATIVE 03/02/2020 2151   LEUKOCYTESUR NEGATIVE 03/02/2020 2151   Sepsis Labs Invalid input(s): PROCALCITONIN,  WBC,  LACTICIDVEN   Time coordinating discharge: 45 minutes  SIGNED:  Mercy Riding, MD  [facility] 03/18/2020, 8:14 AM  If 7PM-7AM, please contact night-coverage [email or web address]

## 2020-03-23 ENCOUNTER — Encounter: Payer: Self-pay | Admitting: Physician Assistant
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
## 2020-03-24 ENCOUNTER — Ambulatory Visit: Payer: Medicare Other | Admitting: Physician Assistant

## 2020-03-24 DIAGNOSIS — I4819 Other persistent atrial fibrillation: Secondary | ICD-10-CM

## 2020-03-24 DIAGNOSIS — D696 Thrombocytopenia, unspecified: Secondary | ICD-10-CM

## 2020-03-24 DIAGNOSIS — N184 Chronic kidney disease, stage 4 (severe): Secondary | ICD-10-CM

## 2020-03-24 DIAGNOSIS — I5020 Unspecified systolic (congestive) heart failure: Secondary | ICD-10-CM

## 2020-03-24 DIAGNOSIS — D539 Nutritional anemia, unspecified: Secondary | ICD-10-CM

## 2020-03-24 DIAGNOSIS — Z952 Presence of prosthetic heart valve: Secondary | ICD-10-CM

## 2020-03-24 DIAGNOSIS — I2510 Atherosclerotic heart disease of native coronary artery without angina pectoris: Secondary | ICD-10-CM
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
## 2020-03-29 ENCOUNTER — Encounter: Payer: Self-pay | Admitting: Physician Assistant

## 2020-03-29 ENCOUNTER — Telehealth: Payer: Self-pay | Admitting: Cardiovascular Disease

## 2020-03-29 ENCOUNTER — Other Ambulatory Visit: Payer: Self-pay

## 2020-03-29 ENCOUNTER — Ambulatory Visit: Payer: Medicare Other | Admitting: Physician Assistant

## 2020-03-29 VITALS — BP 142/78 | HR 99 | Ht 62.0 in | Wt 206.0 lb

## 2020-03-29 DIAGNOSIS — Z952 Presence of prosthetic heart valve: Secondary | ICD-10-CM

## 2020-03-29 DIAGNOSIS — I2510 Atherosclerotic heart disease of native coronary artery without angina pectoris: Secondary | ICD-10-CM | POA: Diagnosis not present

## 2020-03-29 DIAGNOSIS — I4819 Other persistent atrial fibrillation: Secondary | ICD-10-CM

## 2020-03-29 DIAGNOSIS — I5020 Unspecified systolic (congestive) heart failure: Secondary | ICD-10-CM | POA: Diagnosis not present

## 2020-03-29 DIAGNOSIS — D696 Thrombocytopenia, unspecified: Secondary | ICD-10-CM

## 2020-03-29 DIAGNOSIS — D539 Nutritional anemia, unspecified: Secondary | ICD-10-CM

## 2020-03-29 NOTE — Patient Instructions (Addendum)
Medication Instructions:  Your physician recommends that you continue on your current medications as directed. Please refer to the Current Medication list given to you today.  *If you need a refill on your cardiac medications before your next appointment, please call your pharmacy*   Lab Work: None ordered  If you have labs (blood work) drawn today and your tests are completely normal, you will receive your results only by: Marland Kitchen MyChart Message (if you have MyChart) OR . A paper copy in the mail If you have any lab test that is abnormal or we need to change your treatment, we will call you to review the results.   Testing/Procedures: None ordered   Follow-Up: At [facility], you and your health needs are our priority.  As part of our continuing mission to provide you with exceptional heart care, we have created designated Provider Care Teams.  These Care Teams include your primary Cardiologist (physician) and Advanced Practice Providers (APPs -  Physician Assistants and Nurse Practitioners) who all work together to provide you with the care you need, when you need it.  We recommend signing up for the patient portal called "MyChart".  Sign up information is provided on this After Visit Summary.  MyChart is used to connect with patients for Virtual Visits (Telemedicine).  Patients are able to view lab/test results, encounter notes, upcoming appointments, etc.  Non-urgent messages can be sent to your provider as well.   To learn more about what you can do with MyChart, go to [email or web address].    Your next appointment:   Next week at Dr. Victorino December office.  They will call you to arrange   The format for your next appointment:   In Person  Provider:   Sanda Klein, MD   Other Instructions Endocarditis Information  You may be at risk for developing endocarditis since you have an artificial heart valve or a repaired heart valve. Endocarditis is an infection of the lining of the  heart or heart valves. Certain surgical and dental procedures may put you at risk, such as teeth cleaning or other dental procedures or other medical procedures. Notify your doctor or dentist before having any invasive or surgical procedures. You will need to take antibiotics before certain procedures. To prevent endocarditis, maintain good oral health. Seek prompt medical attention for any mouth/gum, skin or urinary tract infections.

## 2020-03-29 NOTE — Telephone Encounter (Signed)
Patient states she checked her pulse ox - HR was 40 She remembered that Dr. Loletha Grayer told her that her pulse ox HR reading may not be accurate so she checked with home BP cuff She checked BP 5 times 133/110 HR 127 137/105 HR 99 129/90 HR 89 132/96 HR 95 141/92 HR 83  She has OV this afternoon with Melina Copa PA Advised to bring updated list of meds and BP cuff to check

## 2020-03-29 NOTE — Telephone Encounter (Signed)
° ° °  STAT if HR is under 50 or over 120 (normal HR is 60-100 beats per minute)  1) What is your heart rate? 40  2) Do you have a log of your heart rate readings (document readings)?   3) Do you have any other symptoms? No symptoms.  Pt said she used her pulse oximeter and her HR is at 40, she is trying to check her BP and HR while on the phone but she was having a hard time taking it. She said if the nurse can call her back

## 2020-03-30 NOTE — Telephone Encounter (Signed)
I saw patient yesterday in clinic so called her back to clarify she was to continue amiodarone for now without change (helping to control rate even if not controlling rhythm). Plan to f/u with Dr. Sallyanne Kuster 11/11 as scheduled. The patient verbalized understanding and gratitude.

## 2020-03-30 NOTE — Telephone Encounter (Signed)
Pt called in and would like to know since her" heart Is out of rhythm" She would like to know if she still needs to take the  amiodarone (PACERONE) 200 MG tablet [[id number]] ?    Best number - [phone]

## 2020-04-07 ENCOUNTER — Ambulatory Visit (INDEPENDENT_AMBULATORY_CARE_PROVIDER_SITE_OTHER): Payer: Medicare Other | Admitting: Cardiovascular Disease

## 2020-04-07 ENCOUNTER — Encounter: Payer: Self-pay | Admitting: Cardiovascular Disease

## 2020-04-07 VITALS — BP 122/89 | HR 120 | Ht 62.0 in | Wt 207.0 lb

## 2020-04-07 DIAGNOSIS — I4819 Other persistent atrial fibrillation: Secondary | ICD-10-CM | POA: Diagnosis not present

## 2020-04-07 DIAGNOSIS — I5042 Chronic combined systolic (congestive) and diastolic (congestive) heart failure: Secondary | ICD-10-CM | POA: Diagnosis not present

## 2020-04-07 DIAGNOSIS — N1832 Chronic kidney disease, stage 3b: Secondary | ICD-10-CM

## 2020-04-07 DIAGNOSIS — E7800 Pure hypercholesterolemia, unspecified: Secondary | ICD-10-CM

## 2020-04-07 DIAGNOSIS — I2510 Atherosclerotic heart disease of native coronary artery without angina pectoris: Secondary | ICD-10-CM | POA: Diagnosis not present

## 2020-04-07 DIAGNOSIS — Z952 Presence of prosthetic heart valve: Secondary | ICD-10-CM

## 2020-04-07 MED ORDER — FUROSEMIDE 40 MG PO TABS
40.0000 mg | ORAL_TABLET | Freq: Every day | ORAL | 2 refills | Status: DC | PRN
Start: 2020-04-07 — End: 2020-05-05

## 2020-04-07 MED ORDER — AMIODARONE HCL 400 MG PO TABS
400.0000 mg | ORAL_TABLET | Freq: Every day | ORAL | 3 refills | Status: DC
Start: 2020-04-07 — End: 2020-04-11

## 2020-04-07 NOTE — Patient Instructions (Addendum)
Medication Instructions:  INCREASE the Amiodarone to 400 mg once daily TAKE Furosemide for weight over 210 pounds  *If you need a refill on your cardiac medications before your next appointment, please call your pharmacy*   Lab Work: None ordered If you have labs (blood work) drawn today and your tests are completely normal, you will receive your results only by: Marland Kitchen MyChart Message (if you have MyChart) OR . A paper copy in the mail If you have any lab test that is abnormal or we need to change your treatment, we will call you to review the results.   Testing/Procedures: None ordered   Follow-Up: At [facility], you and your health needs are our priority.  As part of our continuing mission to provide you with exceptional heart care, we have created designated Provider Care Teams.  These Care Teams include your primary Cardiologist (physician) and Advanced Practice Providers (APPs -  Physician Assistants and Nurse Practitioners) who all work together to provide you with the care you need, when you need it.  We recommend signing up for the patient portal called "MyChart".  Sign up information is provided on this After Visit Summary.  MyChart is used to connect with patients for Virtual Visits (Telemedicine).  Patients are able to view lab/test results, encounter notes, upcoming appointments, etc.  Non-urgent messages can be sent to your provider as well.   To learn more about what you can do with MyChart, go to [email or web address].    Your next appointment:   Keep your follow up with Dr. Sallyanne Kuster on 05/05/20 at 2 pm
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
## 2020-04-08 ENCOUNTER — Telehealth: Payer: Self-pay | Admitting: Cardiovascular Disease

## 2020-04-08 ENCOUNTER — Encounter: Payer: Self-pay | Admitting: Cardiovascular Disease

## 2020-04-08 NOTE — Telephone Encounter (Signed)
Pt c/o medication issue:  1. Name of Medication: amiodarone (PACERONE) 400 MG tablet  2. How are you currently taking this medication (dosage and times per day)?  First time taking 400 mg tablet today, had been taking 200 mg tablet once per day.    3. Are you having a reaction (difficulty breathing--STAT)?  "it's making me shake like I have parkinson's disease" shortness of breath after being active and very weak  Not currently short of breath   4. What is your medication issue?  Patient would like to go back to the 200 mg dosage. Please call/advise.

## 2020-04-08 NOTE — Telephone Encounter (Signed)
Called patient.  She reports took 400 mg this am and has felt shaky/weak most of the day.  Suggested that tomorrow she start with 200 mg and take every 12 hours instead of 400 mg qd.  Patient agreeable to try this.   Will forward message to Dr. Sallyanne Kuster and nurse for Monday if they wish to make any other changes.

## 2020-04-11 MED ORDER — AMIODARONE HCL 200 MG PO TABS
200.0000 mg | ORAL_TABLET | Freq: Every day | ORAL | Status: DC
Start: 2020-04-11 — End: 2020-06-30

## 2020-04-11 NOTE — Telephone Encounter (Signed)
Sunday is calling back stating the recommendations did not work for her and is requesting another medication be prescribed as an alternative. Please advise.

## 2020-04-11 NOTE — Telephone Encounter (Signed)
RN spoke with patient . Instruction given to continue with taking 200 mg daily .  recommend to take Amiodarone with a meal either breakfast or lunch time  Patient verbalized understanding and she states she will try it that way.

## 2020-04-11 NOTE — Addendum Note (Signed)
Addended by: Ricci Barker on: 04/11/2020 04:30 PM   Modules accepted: Orders

## 2020-04-11 NOTE — Telephone Encounter (Signed)
Unfortunately, there are no good alternatives. Just stick with 200 mg once a day please.

## 2020-04-11 NOTE — Telephone Encounter (Signed)
RN spoke to patient . She states she continues to shaky and have dizziness.  She states after previous conversation to reduce amiodarone to 200 mg twice a day. She is now taking 200 mg in the morning only .  patient states she is shaky all the time now  "It never quits"  She is not able to check blood pressure.  she is taking the eliquis twice a day as prescribed. RN  Informed patient will defer to Dr Sallyanne Kuster

## 2020-04-25 ENCOUNTER — Telehealth: Payer: Self-pay | Admitting: Cardiovascular Disease

## 2020-04-25 NOTE — Telephone Encounter (Signed)
Spoke to [employer] with [facility] would call patient to discuss.   Spoke to patient-patient reports weight increase and some increased swelling in ankles and feet.   Weights: 11/11 206 11/12 207 11/16 209  11/18 208 11/29 214 --Unsure of weights on other days.  Patient denies increased SOB, states she always has LE edema but feels it has increased.  this does not resolve with elevation overnight.  She has O2 at home but O2 sat was >90% today on RA.  She has taken furosemide 40 daily x 5 days.  Reports eating ham and dressing over the holidays.   She did not write her BP and HR down but states it was "ok" when [facility] was there.   Advised to monitor salt intake closely and continue to weigh daily.   Will route to MD to review for medication recommendations and call patient back.   Upcoming visit with Dr. Sallyanne Kuster on 12/9

## 2020-04-25 NOTE — Telephone Encounter (Signed)
Sounds like it could be diet related. Would do furosemide 80 mg daily for 2 days then back to 40 mg, make sure no extra salt in diet and call back on Wednesday with her weight, please. Thanks, Ronalee Belts

## 2020-04-25 NOTE — Telephone Encounter (Signed)
Home Health Verbal Orders - Caller/Agency: [place] with [facility] Number: [phone]  Pt c/o swelling: STAT is pt has developed SOB within 24 hours  1) How much weight have you gained and in what time span?  208 on 11/18 and 215 on 11/29  2) If swelling, where is the swelling located? Legs and ankles   3) Are you currently taking a fluid pill? Yes prn 40 mg everyday   4) Are you currently SOB? No saturation was above 90  5) Do you have a log of your daily weights (if so, list)? No pt is report about a pound a day to home health    6) Have you traveled recently? No   Home health is no longer at pt home .    Pt told home health that she was accidentally taking double of the amiodarone (PACERONE) 200 MG tablet [[id number]]  For about a week.  As of yesterday started taking the correct dose

## 2020-04-25 NOTE — Telephone Encounter (Signed)
Patient aware of recommendations and verbalized understanding

## 2020-04-28 ENCOUNTER — Telehealth: Payer: Self-pay | Admitting: Cardiovascular Disease

## 2020-04-28 MED ORDER — METOLAZONE 2.5 MG PO TABS: 2.5000 mg | ORAL_TABLET | Freq: Once | ORAL | 3 refills | Status: DC | PRN

## 2020-04-28 NOTE — Telephone Encounter (Signed)
Please take metolazone 2.5 mg ONCE today, then take an additional furosemide 80 mg 30 minutes after the metolazone. Please call us back in AM with her weight. If she does not have any metolazone 2.5 mg at home, please call in a RX for 10 tabs, 3RF, take ONLY AS NEEDED WHEN INSTRUCTED. Thanks.

## 2020-04-28 NOTE — Telephone Encounter (Signed)
S/w PharMD she states that pt should take metolazone 2.5mg  at noon and wait 30 minutes then take additional lasix 80mg  at 1230pm. Make sure to elevate LE and be close to bathroom today as we are trying to get the additional weight/fluid off. And make sure to call first thing in the morning tomorrow-Friday 8am. Pt informed of providers result & recommendations. Pt verbalized understanding. She will take medication as ordered and call first thing in the morning with the results.

## 2020-04-28 NOTE — Telephone Encounter (Signed)
Returned call to pt she states that 2 days ago her weight was up 5#(214#) in one week so for the last 2 days she has been taking lasix 80mg  a with no results she is still 214#, she will continue taking the 80mg  today only and see if this helps. She states that she has DOE and occasional SOB when sitting but not SOB all the the time(I cannot hear this when she is talking) just DOE with short distances, this is also intermittent. She is taking her amio and eliquis as ordered.  She states that her BP yesterday at an appointment was 120/68 HR 113 then HR went down to 95. She states that her BP cuff is broken and has been unable to take at home and unable get another cuff so far.  Her creatinine was 1.5 on 03-24-20.  What should we do now? Please advise?

## 2020-04-28 NOTE — Telephone Encounter (Signed)
Pt c/o medication issue:  1. Name of Medication: furosemide (LASIX) 40 MG tablet  2. How are you currently taking this medication (dosage and times per day)? 2 tablets daily for two days   3. Are you having a reaction (difficulty breathing--STAT)? No   4. What is your medication issue? Carrie Monroe is calling stating she was advised to call in and report to Dr. Victorino December nurse in regards to taking this medication two tablets daily for two days. Please advise.

## 2020-04-29 ENCOUNTER — Telehealth: Payer: Self-pay | Admitting: Cardiovascular Disease

## 2020-04-29 NOTE — Telephone Encounter (Signed)
New Message:      Carrie Monroe says he needs a Verbal Order for Pt for 1 time a week for 3 weeks, starting on 05-02-20 please. If he does not answer, please leave a voice message with the ordering doctor name.

## 2020-04-29 NOTE — Telephone Encounter (Signed)
Would like to avoid back to back doses of metolazone, since this can lead to excessively fast fluid loss and low potassium levels.  - Today 12/03, please take furosemide 80 mg once - tomorrow 12/04, take metolazone 2.5 mg and 30 minutes later take furosemide 80 mg once. - Sunday 12/05 take furosemide 80 mg once - please continue weighing daily and touch base again on Monday morning for further instructions Thanks

## 2020-04-29 NOTE — Telephone Encounter (Signed)
Spoke with patient. Patient reports she was 210lbs this morning, down from 214lbs yesterday. She reports she still has a lot of fluid in her feet and legs and wants to know what to do next. Will route to MD and Pharm D for review.

## 2020-04-29 NOTE — Telephone Encounter (Signed)
Follow Up:     Pt is returning Lisa's call from yesterday. Pt wanted her to know her weight is 210.

## 2020-04-29 NOTE — Telephone Encounter (Signed)
Yes, please.

## 2020-04-29 NOTE — Telephone Encounter (Signed)
Voicemail left giving okay for PT orders from Dr. Sallyanne Kuster.

## 2020-04-29 NOTE — Telephone Encounter (Signed)
Spoke with patient. Informed patient of Dr. Victorino December recommendations / instructions. Patient verbalized understanding. Will call back if symptoms worsen, weight increases. Patient to call back Monday with weights.

## 2020-04-29 NOTE — Telephone Encounter (Signed)
See other encounter 04/28/20

## 2020-05-02 ENCOUNTER — Telehealth: Payer: Self-pay | Admitting: Cardiovascular Disease

## 2020-05-02 NOTE — Telephone Encounter (Signed)
Spoke with pt, aware of the recommendations related to her weight and she wrote them down and repeated them to me. She still has some swelling in her feet but is keeping them elevated, watching her salt and wearing her compression hose.

## 2020-05-02 NOTE — Telephone Encounter (Signed)
Spoke with pt, she reports she has lost 15 lbs since her lasix was increased. She has swelling in her feet and legs but it is better. She is SOB but can not tell me when. She has been taking 80 mg of furosemide and is calling to see if she needs to continue that. Will forward to dr croitoru to review.

## 2020-05-02 NOTE — Telephone Encounter (Signed)
Did she report the actual weight?  - If under 210 lb can reduce furosemide to 40 mg daily. - Take furosemide 80 mg daily on days that weight is 210 lb or higher. - Can take metolazone 2.5 mg once or twice weekly (not on consecutive days) if the furosemide 80 mg dose does not get weight back to <210 lb Thank you!

## 2020-05-02 NOTE — Telephone Encounter (Signed)
Pt c/o swelling: STAT is pt has developed SOB within 24 hours  1) How much weight have you gained and in what time span? Lost 15lbs  2) If swelling, where is the swelling located? Both feet and legs  3) Are you currently taking a fluid pill? Yes  4) Are you currently SOB? A little  5) Do you have a log of your daily weights (if so, list)? Yes   6) Have you gained 3 pounds in a day or 5 pounds in a week? N/A  7) Have you traveled recently? N/A

## 2020-05-05 ENCOUNTER — Encounter: Payer: Self-pay | Admitting: Cardiovascular Disease

## 2020-05-05 ENCOUNTER — Ambulatory Visit (INDEPENDENT_AMBULATORY_CARE_PROVIDER_SITE_OTHER): Payer: Medicare Other | Admitting: Cardiovascular Disease

## 2020-05-05 ENCOUNTER — Other Ambulatory Visit: Payer: Self-pay

## 2020-05-05 ENCOUNTER — Ambulatory Visit: Payer: Medicare Other | Admitting: Cardiovascular Disease

## 2020-05-05 VITALS — BP 118/71 | HR 95 | Ht 63.0 in | Wt 204.0 lb

## 2020-05-05 DIAGNOSIS — Z952 Presence of prosthetic heart valve: Secondary | ICD-10-CM | POA: Diagnosis not present

## 2020-05-05 DIAGNOSIS — I2510 Atherosclerotic heart disease of native coronary artery without angina pectoris: Secondary | ICD-10-CM | POA: Diagnosis not present

## 2020-05-05 DIAGNOSIS — I4819 Other persistent atrial fibrillation: Secondary | ICD-10-CM

## 2020-05-05 DIAGNOSIS — E7800 Pure hypercholesterolemia, unspecified: Secondary | ICD-10-CM

## 2020-05-05 DIAGNOSIS — I5042 Chronic combined systolic (congestive) and diastolic (congestive) heart failure: Secondary | ICD-10-CM | POA: Diagnosis not present

## 2020-05-05 DIAGNOSIS — N1832 Chronic kidney disease, stage 3b: Secondary | ICD-10-CM

## 2020-05-05 MED ORDER — FUROSEMIDE 40 MG PO TABS
80.0000 mg | ORAL_TABLET | Freq: Every day | ORAL | 2 refills | Status: DC
Start: 2020-05-05 — End: 2020-06-30
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
## 2020-05-05 NOTE — Patient Instructions (Addendum)
Medication Instructions:  INCREASE the Furosemide to 80 mg once daily  *If you need a refill on your cardiac medications before your next appointment, please call your pharmacy*   Testing/Procedures:  You are scheduled for a Cardioversion on 05/24/20 with Dr. Sallyanne Kuster.  Please arrive at the [facility] (Main Entrance A) at [facility]: [address] [place], [place] [address] at 9 am am/pm. (1 hour prior to procedure)  DIET: Nothing to eat or drink after midnight except a sip of water with medications (see medication instructions below)  Medication Instructions: Hold Furosemide the morning of the procedire  Continue your anticoagulant: Eliquis You will need to continue your anticoagulant after your procedure until you  are told by your provider that it is safe to stop   Labs:  Your provider would like for you to return on 05/17/20 to have the following labs drawn: CBC and BMET. You do not need an appointment for the lab. Once in our office lobby there is a podium where you can sign in and ring the doorbell to alert [place] that you are here. The lab is open from 8:00 am to 4:30 pm; closed for lunch from 12:45pm-1:45pm.  You will need to have the coronavirus test completed prior to your procedure. An appointment has been made at 9:50 am on 05/23/20. This is a Drive Up Visit at [address], [place], [place] [address]. Please tell them that you are there for procedure testing. Stay in your car and someone will be with you shortly. Please make sure to have all other labs completed before this test because you will need to stay quarantined until your procedure.   You must have a responsible person to drive you home and stay in the waiting area during your procedure. Failure to do so could result in cancellation.  Bring your insurance cards.  *Special Note: Every effort is made to have your procedure done on time. Occasionally there are emergencies that occur at the hospital that  may cause delays. Please be patient if a delay does occur.     Follow-Up: At [facility], you and your health needs are our priority.  As part of our continuing mission to provide you with exceptional heart care, we have created designated Provider Care Teams.  These Care Teams include your primary Cardiologist (physician) and Advanced Practice Providers (APPs -  Physician Assistants and Nurse Practitioners) who all work together to provide you with the care you need, when you need it.  We recommend signing up for the patient portal called "MyChart".  Sign up information is provided on this After Visit Summary.  MyChart is used to connect with patients for Virtual Visits (Telemedicine).  Patients are able to view lab/test results, encounter notes, upcoming appointments, etc.  Non-urgent messages can be sent to your provider as well.   To learn more about what you can do with MyChart, go to [email or web address].    Your next appointment:   2 week(s)  The format for your next appointment:   In Person  Provider:   Sanda Klein, MD

## 2020-05-06 ENCOUNTER — Ambulatory Visit: Payer: Medicare Other | Admitting: Cardiovascular Disease

## 2020-05-12 ENCOUNTER — Telehealth: Payer: Self-pay | Admitting: Cardiovascular Disease

## 2020-05-12 NOTE — Telephone Encounter (Signed)
Patient is calling wanting to cancel her procedure on 05/24/2020 due to stating it hasn't worked in the past. Please advise.

## 2020-05-12 NOTE — Telephone Encounter (Signed)
Routed to primary nurse 

## 2020-05-13 NOTE — Telephone Encounter (Signed)
Returned the call to the patient. She stated that she wants to cancel the cardioversion on 05/24/20 because she feels like it never works and she feels worse after the procedures.  Her daughter was in the background and stated that the they will be seeing another provider. The patient stated that she wanted a second opinion. Her follow up appointment with Dr. Sallyanne Kuster has been canceled per her request. She stated that she would call back if she needed anything further from Dr. Sallyanne Kuster.

## 2020-05-13 NOTE — Telephone Encounter (Signed)
Understood. Did they want a recommendation for another provider? I have offered referral to one of our EPs.

## 2020-05-23 ENCOUNTER — Other Ambulatory Visit (HOSPITAL_COMMUNITY): Payer: Medicare Other

## 2020-05-24 ENCOUNTER — Ambulatory Visit (HOSPITAL_COMMUNITY): Admit: 2020-05-24 | Payer: Medicare Other | Admitting: Cardiovascular Disease

## 2020-05-24 ENCOUNTER — Encounter (HOSPITAL_COMMUNITY): Payer: Self-pay

## 2020-05-24 SURGERY — CARDIOVERSION
Anesthesia: General

## 2020-06-16 ENCOUNTER — Ambulatory Visit: Payer: Medicare Other | Admitting: Cardiovascular Disease

## 2020-06-21 ENCOUNTER — Telehealth: Payer: Self-pay | Admitting: Cardiovascular Disease

## 2020-06-21 NOTE — Telephone Encounter (Signed)
New message:     Patient calling concering some medication lasix. Would like to know if she should still be taking this medication. Amiodarone patient would like to know should she be taking this medication and Eliquis. Please call patient.

## 2020-06-21 NOTE — Telephone Encounter (Signed)
Hard to give advice without knowing more info: is she still in normal rhythm? Did the cardioversion lead to any improvement in symptoms? Did [facility] arrange any follow up after the cardioversion? What is HR and BP? How much edema? Please arrange f/u appt if she wants more advice. In the meantime, best I can advise is to continue meds unchanged.

## 2020-06-21 NOTE — Telephone Encounter (Signed)
Spoke with patient of Dr. Sallyanne Kuster She is asking about meds: eliquis, amiodarone, furosemide She had a cardioversion at [facility] on 12/23 She said her children advised she go to [facility] The doctor at [facility] told her to ask Dr. Loletha Grayer about the aforementioned medications and if she should continue Advised that we would not make med changes without seeing her in the office first, for a follow up She would like to continue to see Dr. Ledon Snare records should be accessible in care everywhere  Will send to MD/RN to review and for any advice

## 2020-06-21 NOTE — Telephone Encounter (Signed)
Spoke with patient of Dr. Loletha Grayer - relayed advice/questions as noted below. Scheduled her a visit with Lurena Joiner PA on a day Dr. Loletha Grayer is in the office (feb 14 @ 915am) to have an ekg and review meds. Advised if [facility] PA has questions, he can consult with MD

## 2020-06-24 ENCOUNTER — Telehealth: Payer: Self-pay | Admitting: Cardiovascular Disease

## 2020-06-24 NOTE — Telephone Encounter (Signed)
Ok for OT

## 2020-06-24 NOTE — Telephone Encounter (Signed)
Benjamine Mola, RN with [facility], is requesting a verbal order for the patient to begin occupational therapy. Please return call to discuss.  Phone #: [phone]

## 2020-06-24 NOTE — Telephone Encounter (Signed)
Zimal made aware and verbalized understanding.

## 2020-06-24 NOTE — Telephone Encounter (Signed)
Spoke with Benjamine Mola from [facility] who report she is requesting verbal order for pt to begin occupational therapy.   Will forward to MD for approval  .

## 2020-06-29 ENCOUNTER — Telehealth: Payer: Self-pay | Admitting: Cardiovascular Disease

## 2020-06-29 NOTE — Telephone Encounter (Signed)
*  STAT* If patient is at the pharmacy, call can be transferred to refill team.   1. Which medications need to be refilled? (please list name of each medication and dose if known) Eliquis, Amiodarone, furosemide    2. Which pharmacy/location (including street and city if local pharmacy) is medication to be sent to? Please send upstrea patient is [place]  [phone] 3. Do they need a 30 day or 90 day supply? [place]

## 2020-06-30 MED ORDER — AMIODARONE HCL 200 MG PO TABS
200.0000 mg | ORAL_TABLET | Freq: Every day | ORAL | 1 refills | Status: DC
Start: 2020-06-30 — End: 2020-12-01

## 2020-06-30 MED ORDER — FUROSEMIDE 40 MG PO TABS
80.0000 mg | ORAL_TABLET | Freq: Every day | ORAL | 1 refills | Status: DC
Start: 2020-06-30 — End: 2020-07-14

## 2020-06-30 MED ORDER — APIXABAN 2.5 MG PO TABS: 2.5000 mg | ORAL_TABLET | Freq: Two times a day (BID) | ORAL | 1 refills | Status: DC

## 2020-06-30 NOTE — Telephone Encounter (Signed)
Let patient know that refills has been sent to her pharmacy.

## 2020-06-30 NOTE — Addendum Note (Signed)
Addended by: Allean Found on: 06/30/2020 02:09 PM   Modules accepted: Orders

## 2020-06-30 NOTE — Telephone Encounter (Signed)
28f 92.5kg Scr 1.5(04/07/20) Lovw/croitoru 05/05/20 Pt requesting eliquis 2.5 mg and they qualify so a refil of eliquis is granted

## 2020-07-11 ENCOUNTER — Ambulatory Visit: Payer: Medicare Other | Admitting: Cardiology

## 2020-07-14 ENCOUNTER — Encounter: Payer: Self-pay | Admitting: Cardiology

## 2020-07-14 ENCOUNTER — Encounter: Payer: Self-pay | Admitting: *Deleted

## 2020-07-14 ENCOUNTER — Ambulatory Visit (INDEPENDENT_AMBULATORY_CARE_PROVIDER_SITE_OTHER): Payer: Medicare Other

## 2020-07-14 ENCOUNTER — Ambulatory Visit (INDEPENDENT_AMBULATORY_CARE_PROVIDER_SITE_OTHER): Payer: Medicare Other | Admitting: Cardiology

## 2020-07-14 ENCOUNTER — Other Ambulatory Visit: Payer: Self-pay

## 2020-07-14 VITALS — BP 139/56 | HR 51 | Ht 63.0 in | Wt 185.0 lb

## 2020-07-14 DIAGNOSIS — I4819 Other persistent atrial fibrillation: Secondary | ICD-10-CM | POA: Diagnosis not present

## 2020-07-14 DIAGNOSIS — I350 Nonrheumatic aortic (valve) stenosis: Secondary | ICD-10-CM | POA: Diagnosis not present

## 2020-07-14 DIAGNOSIS — I2510 Atherosclerotic heart disease of native coronary artery without angina pectoris: Secondary | ICD-10-CM

## 2020-07-14 DIAGNOSIS — I5082 Biventricular heart failure: Secondary | ICD-10-CM | POA: Diagnosis not present

## 2020-07-14 DIAGNOSIS — R42 Dizziness and giddiness: Secondary | ICD-10-CM

## 2020-07-14 DIAGNOSIS — Z952 Presence of prosthetic heart valve: Secondary | ICD-10-CM

## 2020-07-14 NOTE — Assessment & Plan Note (Signed)
"  Severe" LV and RV failure on recent TEE at [facility]

## 2020-07-14 NOTE — Assessment & Plan Note (Signed)
This complaint pre dated Amiodarone Rx but that could exacerbate it.  Bradycardia may also be an issue. Check OP ZIO.  If significant bradycardia I would decrease her Amiodarone to 100 mg daily.

## 2020-07-14 NOTE — Assessment & Plan Note (Signed)
26 mm Medtronic CorValve Evolut Pro transcatheter heart valve placed via percutaneous left transfemoral approach

## 2020-07-14 NOTE — Assessment & Plan Note (Signed)
11/2016: diagnosed with multivessel CAD, turned down for CABG and underwent PCI/DES to [facility] and PCI/DES to mLAD on 12/28/16

## 2020-07-14 NOTE — Assessment & Plan Note (Signed)
NSR- SB after recent TEE CV at [facility] Dec 2021

## 2020-07-14 NOTE — Assessment & Plan Note (Signed)
01/2017: s/p TAVR; hospital course complicated by large groin hematoma/wound

## 2020-07-14 NOTE — Progress Notes (Signed)
Patient ID: Carrie Monroe, female   DOB: [birth year], 85 y.o.   MRN: [id number] Patient enrolled for [employer] to ship a 7 day ZIO XT long term holter monitor to her home.

## 2020-07-14 NOTE — Patient Instructions (Signed)
Medication Instructions:  Continue current medications  *If you need a refill on your cardiac medications before your next appointment, please call your pharmacy*   Lab Work: None Ordered   Testing/Procedures: Your physician has recommended that you wear a Zio monitor for 7 days. Holter monitors are medical devices that record the heart's electrical activity. Doctors most often use these monitors to diagnose arrhythmias. Arrhythmias are problems with the speed or rhythm of the heartbeat. The monitor is a small, portable device. You can wear one while you do your normal daily activities. This is usually used to diagnose what is causing palpitations/syncope (passing out).   Follow-Up: At [facility], you and your health needs are our priority.  As part of our continuing mission to provide you with exceptional heart care, we have created designated Provider Care Teams.  These Care Teams include your primary Cardiologist (physician) and Advanced Practice Providers (APPs -  Physician Assistants and Nurse Practitioners) who all work together to provide you with the care you need, when you need it.  We recommend signing up for the patient portal called "MyChart".  Sign up information is provided on this After Visit Summary.  MyChart is used to connect with patients for Virtual Visits (Telemedicine).  Patients are able to view lab/test results, encounter notes, upcoming appointments, etc.  Non-urgent messages can be sent to your provider as well.   To learn more about what you can do with MyChart, go to [email or web address].    Your next appointment:   3 month(s)  The format for your next appointment:   In Person  Provider:   You may see Sanda Klein, MD or one of the following Advanced Practice Providers on your designated Care Team:    Almyra Deforest, PA-C  Fabian Sharp, [place] or   Roby Lofts, [place]    Other Instructions Bryn Gulling- Long Term Monitor Instructions   Your physician has  requested you wear your ZIO patch monitor 7 days.   This is a single patch monitor.  [employer] supplies one patch monitor per enrollment.  Additional stickers are not available.   Please do not apply patch if you will be having a Nuclear Stress Test, Echocardiogram, Cardiac CT, MRI, or Chest Xray during the time frame you would be wearing the monitor. The patch cannot be worn during these tests.  You cannot remove and re-apply the ZIO XT patch monitor.   Your ZIO patch monitor will be sent USPS Priority mail from [facility] directly to your home address. The monitor may also be mailed to a PO BOX if home delivery is not available.   It may take 3-5 days to receive your monitor after you have been enrolled.   Once you have received you monitor, please review enclosed instructions.  Your monitor has already been registered assigning a specific monitor serial # to you.   Applying the monitor   Shave hair from upper left chest.   Hold abrader disc by orange tab.  Rub abrader in 40 strokes over left upper chest as indicated in your monitor instructions.   Clean area with 4 enclosed alcohol pads .  Use all pads to assure are is cleaned thoroughly.  Let dry.   Apply patch as indicated in monitor instructions.  Patch will be place under collarbone on left side of chest with arrow pointing upward.   Rub patch adhesive wings for 2 minutes.Remove white label marked "1".  Remove white label marked "2".  Rub patch adhesive wings  for 2 additional minutes.   While looking in a mirror, press and release button in center of patch.  A small green light will flash 3-4 times .  This will be your only indicator the monitor has been turned on.     Do not shower for the first 24 hours.  You may shower after the first 24 hours.   Press button if you feel a symptom. You will hear a small click.  Record Date, Time and Symptom in the Patient Log Book.   When you are ready to remove patch, follow  instructions on last 2 pages of Patient Log Book.  Stick patch monitor onto last page of Patient Log Book.   Place Patient Log Book in [place] box.  Use locking tab on box and tape box closed securely.  The Orange and [employer] has [employer] on it.  Please place in mailbox as soon as possible.  Your physician should have your test results approximately 7 days after the monitor has been mailed back to [facility].   Call [place] at [phone] if you have questions regarding your ZIO XT patch monitor.  Call them immediately if you see an orange light blinking on your monitor.   If your monitor falls off in less than 4 days contact our Monitor department at [phone].  If your monitor becomes loose or falls off after 4 days call [employer] at [phone] for suggestions on securing your monitor.
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
## 2020-07-19 DIAGNOSIS — I4819 Other persistent atrial fibrillation: Secondary | ICD-10-CM

## 2020-07-20 ENCOUNTER — Telehealth: Payer: Self-pay | Admitting: Cardiovascular Disease

## 2020-07-20 NOTE — Telephone Encounter (Signed)
    Shala with [facility] home health would like to verify if Dr. Loletha Grayer got their fax for verbal orders, she said she faxed it last 02/16

## 2020-07-20 NOTE — Telephone Encounter (Signed)
Attempt to return call, unable to leave VM (VM full)  Dr. Sallyanne Kuster out of office until 3/2

## 2020-07-21 NOTE — Telephone Encounter (Signed)
Spoke with shala, aware we have the orders but dr c will not be back to sign until 07/27/20.

## 2020-08-17 ENCOUNTER — Telehealth: Payer: Self-pay | Admitting: Cardiovascular Disease

## 2020-08-17 NOTE — Telephone Encounter (Signed)
    Carrie Monroe with [facility] home health calling back. She said they sent 2 orders for Dr. Loletha Grayer to sign and only one of them was signed by Dr. Loletha Grayer. She will re-fax today the paperwork for medicare claims. She would like to get the order back as soon as possible.

## 2020-08-22 NOTE — Telephone Encounter (Signed)
Attempted to reach [facility] but the voicemail was full. Orders have not been received yet that need to be signed.

## 2020-08-25 ENCOUNTER — Encounter: Payer: Self-pay | Admitting: Physical Therapy

## 2020-08-25 ENCOUNTER — Ambulatory Visit: Payer: Medicare Other | Attending: Internal Medicine | Admitting: Physical Therapy

## 2020-08-25 ENCOUNTER — Other Ambulatory Visit: Payer: Self-pay

## 2020-08-25 DIAGNOSIS — R2681 Unsteadiness on feet: Secondary | ICD-10-CM

## 2020-08-25 DIAGNOSIS — R2689 Other abnormalities of gait and mobility: Secondary | ICD-10-CM

## 2020-08-25 DIAGNOSIS — R42 Dizziness and giddiness: Secondary | ICD-10-CM | POA: Diagnosis not present

## 2020-08-25 NOTE — Patient Instructions (Signed)
Gaze Stabilization: Tip Card  1.Target must remain in focus, not blurry, and appear stationary while head is in motion. 2.Perform exercises with small head movements (45 to either side of midline). 3.Increase speed of head motion so long as target is in focus. 4.If you wear eyeglasses, be sure you can see target through lens (therapist will give specific instructions for bifocal / progressive lenses). 5.These exercises may provoke dizziness or nausea. Work through these symptoms. If too dizzy, slow head movement slightly. Rest between each exercise. 6.Exercises demand concentration; avoid distractions. 7.For safety, perform standing exercises close to a counter, wall, corner, or next to someone.      Gaze Stabilization: Sitting    Keeping eyes on target on wall \\_10N  feet away, tilt head down 15-30 and move head side to side for __30-60__ seconds. Repeat while moving head up and down for _30-60___ seconds. Do _3-5___ sessions per day.   Copyright  VHI. All rights reserved.
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
## 2020-09-13 ENCOUNTER — Ambulatory Visit: Payer: Medicare Other | Admitting: Physical Therapy

## 2020-09-15 ENCOUNTER — Ambulatory Visit: Payer: Medicare Other | Admitting: Physical Therapy

## 2020-09-20 ENCOUNTER — Ambulatory Visit: Payer: Medicare Other | Admitting: Physical Therapy

## 2020-09-22 ENCOUNTER — Encounter: Payer: Medicare Other | Admitting: Physical Therapy

## 2020-09-27 ENCOUNTER — Encounter: Payer: Medicare Other | Admitting: Physical Therapy

## 2020-09-29 ENCOUNTER — Encounter: Payer: Medicare Other | Admitting: Physical Therapy

## 2020-10-18 ENCOUNTER — Ambulatory Visit: Payer: Medicare Other | Admitting: Cardiovascular Disease

## 2020-10-18 ENCOUNTER — Encounter: Payer: Self-pay | Admitting: Cardiovascular Disease

## 2020-10-18 ENCOUNTER — Other Ambulatory Visit: Payer: Self-pay

## 2020-10-18 VITALS — BP 174/73 | HR 48 | Ht 63.0 in | Wt 183.6 lb

## 2020-10-18 DIAGNOSIS — E7800 Pure hypercholesterolemia, unspecified: Secondary | ICD-10-CM

## 2020-10-18 DIAGNOSIS — I5042 Chronic combined systolic (congestive) and diastolic (congestive) heart failure: Secondary | ICD-10-CM

## 2020-10-18 DIAGNOSIS — I2510 Atherosclerotic heart disease of native coronary artery without angina pectoris: Secondary | ICD-10-CM | POA: Diagnosis not present

## 2020-10-18 DIAGNOSIS — I4819 Other persistent atrial fibrillation: Secondary | ICD-10-CM | POA: Diagnosis not present

## 2020-10-18 DIAGNOSIS — Z952 Presence of prosthetic heart valve: Secondary | ICD-10-CM

## 2020-10-18 DIAGNOSIS — N184 Chronic kidney disease, stage 4 (severe): Secondary | ICD-10-CM

## 2020-10-18 MED ORDER — AMOXICILLIN 500 MG PO CAPS: 2000.0000 mg | ORAL_CAPSULE | Freq: Once | ORAL | 0 refills | Status: AC
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
## 2020-10-18 NOTE — Patient Instructions (Signed)
Medication Instructions:  TAKE the Amoxicillin 2000 mg (4 of the 500 mg tablets) one hour prior to the procedure  *If you need a refill on your cardiac medications before your next appointment, please call your pharmacy*   Lab Work: None ordered If you have labs (blood work) drawn today and your tests are completely normal, you will receive your results only by: Marland Kitchen MyChart Message (if you have MyChart) OR . A paper copy in the mail If you have any lab test that is abnormal or we need to change your treatment, we will call you to review the results.   Testing/Procedures: None ordered   Follow-Up: At [facility], you and your health needs are our priority.  As part of our continuing mission to provide you with exceptional heart care, we have created designated Provider Care Teams.  These Care Teams include your primary Cardiologist (physician) and Advanced Practice Providers (APPs -  Physician Assistants and Nurse Practitioners) who all work together to provide you with the care you need, when you need it.  We recommend signing up for the patient portal called "MyChart".  Sign up information is provided on this After Visit Summary.  MyChart is used to connect with patients for Virtual Visits (Telemedicine).  Patients are able to view lab/test results, encounter notes, upcoming appointments, etc.  Non-urgent messages can be sent to your provider as well.   To learn more about what you can do with MyChart, go to [email or web address].    Your next appointment:   6 month(s)  The format for your next appointment:   In Person  Provider:   You may see Sanda Klein, MD or one of the following Advanced Practice Providers on your designated Care Team:    Almyra Deforest, PA-C  Fabian Sharp, [place] or   Roby Lofts, PA-C    Other Instructions Hold the Eliquis for 2 days prior to the surgery

## 2020-12-01 ENCOUNTER — Telehealth: Payer: Self-pay | Admitting: Cardiovascular Disease

## 2020-12-01 MED ORDER — APIXABAN 2.5 MG PO TABS
2.5000 mg | ORAL_TABLET | Freq: Two times a day (BID) | ORAL | 1 refills | Status: DC
Start: 2020-12-01 — End: 2021-02-07

## 2020-12-01 MED ORDER — AMIODARONE HCL 200 MG PO TABS
200.0000 mg | ORAL_TABLET | Freq: Every day | ORAL | 1 refills | Status: DC
Start: 2020-12-01 — End: 2021-02-07

## 2020-12-01 NOTE — Telephone Encounter (Signed)
38f, 83.3kg, lovw/croitoru 10/18/20 08/17/20 Creatinine 0.4 - 1.0 mg/dL 1.9 High

## 2020-12-01 NOTE — Telephone Encounter (Signed)
Per Karlene Einstein with [facility]:    *STAT* If patient is at the pharmacy, call can be transferred to refill team.   1. Which medications need to be refilled? (please list name of each medication and dose if known)  apixaban (ELIQUIS) 2.5 MG TABS tablet  2. Which pharmacy/location (including street and city if local pharmacy) is medication to be sent to?  [facility] - [place], [place] - [place] [address]  3. Do they need a 30 day or 90 day supply?  90 day supply

## 2020-12-01 NOTE — Telephone Encounter (Signed)
*  STAT* If patient is at the pharmacy, call can be transferred to refill team.   1. Which medications need to be refilled? (please list name of each medication and dose if known) amiodarone (PACERONE) 200 MG tablet  2. Which pharmacy/location (including street and city if local pharmacy) is medication to be sent to? [facility] - [place], [place] - [place] [address]  3. Do they need a 30 day or 90 day supply? [place]

## 2020-12-26 ENCOUNTER — Other Ambulatory Visit (HOSPITAL_COMMUNITY): Payer: Self-pay | Admitting: Family Medicine

## 2020-12-26 DIAGNOSIS — M79604 Pain in right leg: Secondary | ICD-10-CM

## 2020-12-27 ENCOUNTER — Ambulatory Visit (HOSPITAL_COMMUNITY): Payer: Medicare Other

## 2021-01-02 ENCOUNTER — Emergency Department (HOSPITAL_COMMUNITY): Payer: Medicare Other

## 2021-01-02 ENCOUNTER — Encounter (HOSPITAL_COMMUNITY): Payer: Self-pay

## 2021-01-02 ENCOUNTER — Inpatient Hospital Stay (HOSPITAL_COMMUNITY)
Admission: EM | Admit: 2021-01-02 | Discharge: 2021-01-09 | DRG: 291 | Disposition: A | Discharge status: Skilled Nursing Facility | Payer: Medicare Other | Attending: Internal Medicine | Admitting: Internal Medicine

## 2021-01-02 ENCOUNTER — Other Ambulatory Visit: Payer: Self-pay

## 2021-01-02 DIAGNOSIS — Z961 Presence of intraocular lens: Secondary | ICD-10-CM | POA: Diagnosis present

## 2021-01-02 DIAGNOSIS — Z87442 Personal history of urinary calculi: Secondary | ICD-10-CM

## 2021-01-02 DIAGNOSIS — C4440 Unspecified malignant neoplasm of skin of scalp and neck: Secondary | ICD-10-CM | POA: Diagnosis present

## 2021-01-02 DIAGNOSIS — N1831 Chronic kidney disease, stage 3a: Secondary | ICD-10-CM | POA: Diagnosis present

## 2021-01-02 DIAGNOSIS — L89150 Pressure ulcer of sacral region, unstageable: Secondary | ICD-10-CM | POA: Diagnosis present

## 2021-01-02 DIAGNOSIS — Z6831 Body mass index (BMI) 31.0-31.9, adult: Secondary | ICD-10-CM

## 2021-01-02 DIAGNOSIS — Z8249 Family history of ischemic heart disease and other diseases of the circulatory system: Secondary | ICD-10-CM

## 2021-01-02 DIAGNOSIS — I4821 Permanent atrial fibrillation: Secondary | ICD-10-CM | POA: Diagnosis present

## 2021-01-02 DIAGNOSIS — M19012 Primary osteoarthritis, left shoulder: Secondary | ICD-10-CM | POA: Diagnosis present

## 2021-01-02 DIAGNOSIS — R52 Pain, unspecified: Secondary | ICD-10-CM

## 2021-01-02 DIAGNOSIS — Z953 Presence of xenogenic heart valve: Secondary | ICD-10-CM

## 2021-01-02 DIAGNOSIS — Z79899 Other long term (current) drug therapy: Secondary | ICD-10-CM

## 2021-01-02 DIAGNOSIS — L89152 Pressure ulcer of sacral region, stage 2: Secondary | ICD-10-CM | POA: Diagnosis present

## 2021-01-02 DIAGNOSIS — E1122 Type 2 diabetes mellitus with diabetic chronic kidney disease: Secondary | ICD-10-CM | POA: Diagnosis present

## 2021-01-02 DIAGNOSIS — E6609 Other obesity due to excess calories: Secondary | ICD-10-CM | POA: Diagnosis present

## 2021-01-02 DIAGNOSIS — D638 Anemia in other chronic diseases classified elsewhere: Secondary | ICD-10-CM | POA: Diagnosis present

## 2021-01-02 DIAGNOSIS — D509 Iron deficiency anemia, unspecified: Secondary | ICD-10-CM | POA: Diagnosis present

## 2021-01-02 DIAGNOSIS — I429 Cardiomyopathy, unspecified: Secondary | ICD-10-CM | POA: Diagnosis present

## 2021-01-02 DIAGNOSIS — Z96653 Presence of artificial knee joint, bilateral: Secondary | ICD-10-CM | POA: Diagnosis present

## 2021-01-02 DIAGNOSIS — L89892 Pressure ulcer of other site, stage 2: Secondary | ICD-10-CM | POA: Diagnosis present

## 2021-01-02 DIAGNOSIS — I480 Paroxysmal atrial fibrillation: Secondary | ICD-10-CM | POA: Diagnosis not present

## 2021-01-02 DIAGNOSIS — T462X5A Adverse effect of other antidysrhythmic drugs, initial encounter: Secondary | ICD-10-CM | POA: Diagnosis present

## 2021-01-02 DIAGNOSIS — Z9842 Cataract extraction status, left eye: Secondary | ICD-10-CM

## 2021-01-02 DIAGNOSIS — I495 Sick sinus syndrome: Secondary | ICD-10-CM | POA: Diagnosis present

## 2021-01-02 DIAGNOSIS — I4589 Other specified conduction disorders: Secondary | ICD-10-CM | POA: Diagnosis present

## 2021-01-02 DIAGNOSIS — Z955 Presence of coronary angioplasty implant and graft: Secondary | ICD-10-CM

## 2021-01-02 DIAGNOSIS — I130 Hypertensive heart and chronic kidney disease with heart failure and stage 1 through stage 4 chronic kidney disease, or unspecified chronic kidney disease: Secondary | ICD-10-CM | POA: Diagnosis present

## 2021-01-02 DIAGNOSIS — Z885 Allergy status to narcotic agent status: Secondary | ICD-10-CM

## 2021-01-02 DIAGNOSIS — Z9049 Acquired absence of other specified parts of digestive tract: Secondary | ICD-10-CM

## 2021-01-02 DIAGNOSIS — M25561 Pain in right knee: Secondary | ICD-10-CM | POA: Diagnosis present

## 2021-01-02 DIAGNOSIS — R001 Bradycardia, unspecified: Secondary | ICD-10-CM | POA: Diagnosis not present

## 2021-01-02 DIAGNOSIS — M25461 Effusion, right knee: Secondary | ICD-10-CM | POA: Diagnosis present

## 2021-01-02 DIAGNOSIS — M109 Gout, unspecified: Secondary | ICD-10-CM | POA: Diagnosis present

## 2021-01-02 DIAGNOSIS — E8770 Fluid overload, unspecified: Secondary | ICD-10-CM | POA: Diagnosis present

## 2021-01-02 DIAGNOSIS — R5383 Other fatigue: Secondary | ICD-10-CM

## 2021-01-02 DIAGNOSIS — Z833 Family history of diabetes mellitus: Secondary | ICD-10-CM

## 2021-01-02 DIAGNOSIS — Z85828 Personal history of other malignant neoplasm of skin: Secondary | ICD-10-CM

## 2021-01-02 DIAGNOSIS — Z20822 Contact with and (suspected) exposure to covid-19: Secondary | ICD-10-CM | POA: Diagnosis present

## 2021-01-02 DIAGNOSIS — N179 Acute kidney failure, unspecified: Secondary | ICD-10-CM | POA: Diagnosis not present

## 2021-01-02 DIAGNOSIS — I2510 Atherosclerotic heart disease of native coronary artery without angina pectoris: Secondary | ICD-10-CM | POA: Diagnosis present

## 2021-01-02 DIAGNOSIS — I350 Nonrheumatic aortic (valve) stenosis: Secondary | ICD-10-CM | POA: Diagnosis not present

## 2021-01-02 DIAGNOSIS — Z952 Presence of prosthetic heart valve: Secondary | ICD-10-CM | POA: Diagnosis not present

## 2021-01-02 DIAGNOSIS — I4819 Other persistent atrial fibrillation: Secondary | ICD-10-CM | POA: Diagnosis not present

## 2021-01-02 DIAGNOSIS — Z7902 Long term (current) use of antithrombotics/antiplatelets: Secondary | ICD-10-CM

## 2021-01-02 DIAGNOSIS — I447 Left bundle-branch block, unspecified: Secondary | ICD-10-CM | POA: Diagnosis present

## 2021-01-02 DIAGNOSIS — I5043 Acute on chronic combined systolic (congestive) and diastolic (congestive) heart failure: Secondary | ICD-10-CM

## 2021-01-02 DIAGNOSIS — M75102 Unspecified rotator cuff tear or rupture of left shoulder, not specified as traumatic: Secondary | ICD-10-CM | POA: Diagnosis present

## 2021-01-02 DIAGNOSIS — E8809 Other disorders of plasma-protein metabolism, not elsewhere classified: Secondary | ICD-10-CM | POA: Diagnosis present

## 2021-01-02 DIAGNOSIS — Z7982 Long term (current) use of aspirin: Secondary | ICD-10-CM

## 2021-01-02 DIAGNOSIS — I959 Hypotension, unspecified: Secondary | ICD-10-CM | POA: Diagnosis present

## 2021-01-02 DIAGNOSIS — E785 Hyperlipidemia, unspecified: Secondary | ICD-10-CM | POA: Diagnosis present

## 2021-01-02 DIAGNOSIS — Z9841 Cataract extraction status, right eye: Secondary | ICD-10-CM

## 2021-01-02 DIAGNOSIS — Z853 Personal history of malignant neoplasm of breast: Secondary | ICD-10-CM

## 2021-01-02 DIAGNOSIS — Z7901 Long term (current) use of anticoagulants: Secondary | ICD-10-CM

## 2021-01-02 DIAGNOSIS — D649 Anemia, unspecified: Secondary | ICD-10-CM | POA: Diagnosis not present

## 2021-01-02 LAB — CBC WITH DIFFERENTIAL/PLATELET
Abs Immature Granulocytes: 0.04 10*3/uL (ref 0.00–0.07)
Basophils Absolute: 0 10*3/uL (ref 0.0–0.1)
Basophils Relative: 0 %
Eosinophils Absolute: 0 10*3/uL (ref 0.0–0.5)
Eosinophils Relative: 0 %
HCT: 28.6 % — ABNORMAL LOW (ref 36.0–46.0)
Hemoglobin: 9.1 g/dL — ABNORMAL LOW (ref 12.0–15.0)
Immature Granulocytes: 1 %
Lymphocytes Relative: 8 %
Lymphs Abs: 0.6 10*3/uL — ABNORMAL LOW (ref 0.7–4.0)
MCH: 31 pg (ref 26.0–34.0)
MCHC: 31.8 g/dL (ref 30.0–36.0)
MCV: 97.3 fL (ref 80.0–100.0)
Monocytes Absolute: 0.6 10*3/uL (ref 0.1–1.0)
Monocytes Relative: 8 %
Neutro Abs: 6.3 10*3/uL (ref 1.7–7.7)
Neutrophils Relative %: 83 %
Platelets: 147 10*3/uL — ABNORMAL LOW (ref 150–400)
RBC: 2.94 MIL/uL — ABNORMAL LOW (ref 3.87–5.11)
RDW: 14.6 % (ref 11.5–15.5)
WBC: 7.6 10*3/uL (ref 4.0–10.5)
nRBC: 0 % (ref 0.0–0.2)

## 2021-01-02 LAB — COMPREHENSIVE METABOLIC PANEL
ALT: 61 U/L — ABNORMAL HIGH (ref 0–44)
AST: 69 U/L — ABNORMAL HIGH (ref 15–41)
Albumin: 2.2 g/dL — ABNORMAL LOW (ref 3.5–5.0)
Alkaline Phosphatase: 92 U/L (ref 38–126)
Anion gap: 8 (ref 5–15)
BUN: 42 mg/dL — ABNORMAL HIGH (ref 8–23)
CO2: 25 mmol/L (ref 22–32)
Calcium: 8.9 mg/dL (ref 8.9–10.3)
Chloride: 101 mmol/L (ref 98–111)
Creatinine, Ser: 1.67 mg/dL — ABNORMAL HIGH (ref 0.44–1.00)
GFR, Estimated: 30 mL/min — ABNORMAL LOW (ref >60)
Glucose, Bld: 115 mg/dL — ABNORMAL HIGH (ref 70–99)
Potassium: 3.9 mmol/L (ref 3.5–5.1)
Sodium: 134 mmol/L — ABNORMAL LOW (ref 135–145)
Total Bilirubin: 1.1 mg/dL (ref 0.3–1.2)
Total Protein: 6.2 g/dL — ABNORMAL LOW (ref 6.5–8.1)

## 2021-01-02 LAB — RESP PANEL BY RT-PCR (FLU A&B, COVID) ARPGX2
Influenza A by PCR: NEGATIVE
Influenza B by PCR: NEGATIVE
SARS Coronavirus 2 by RT PCR: NEGATIVE

## 2021-01-02 LAB — URINALYSIS, ROUTINE W REFLEX MICROSCOPIC
Bilirubin Urine: NEGATIVE
Glucose, UA: NEGATIVE mg/dL
Hgb urine dipstick: NEGATIVE
Ketones, ur: NEGATIVE mg/dL
Leukocytes,Ua: NEGATIVE
Nitrite: NEGATIVE
Protein, ur: NEGATIVE mg/dL
Specific Gravity, Urine: 1.006 (ref 1.005–1.030)
pH: 7 (ref 5.0–8.0)

## 2021-01-02 LAB — BRAIN NATRIURETIC PEPTIDE: B Natriuretic Peptide: 661.2 pg/mL — ABNORMAL HIGH (ref 0.0–100.0)

## 2021-01-02 MED ORDER — AMIODARONE HCL 200 MG PO TABS
200.0000 mg | ORAL_TABLET | Freq: Every day | ORAL | Status: DC
  Administered 2021-01-03 – 2021-01-09 (×7): 200 mg via ORAL
  Filled 2021-01-02 (×7): qty 1

## 2021-01-02 MED ORDER — ROSUVASTATIN CALCIUM 5 MG PO TABS
10.0000 mg | ORAL_TABLET | Freq: Every day | ORAL | Status: DC
  Administered 2021-01-02 – 2021-01-09 (×8): 10 mg via ORAL
  Filled 2021-01-02 (×8): qty 2

## 2021-01-02 MED ORDER — ACETAMINOPHEN 325 MG PO TABS
650.0000 mg | ORAL_TABLET | Freq: Four times a day (QID) | ORAL | Status: DC | PRN
  Filled 2021-01-02: qty 2

## 2021-01-02 MED ORDER — MORPHINE SULFATE (PF) 2 MG/ML IV SOLN: 2.0000 mg | INTRAVENOUS | Status: DC | PRN

## 2021-01-02 MED ORDER — FERROUS SULFATE 325 (65 FE) MG PO TABS
325.0000 mg | ORAL_TABLET | Freq: Every day | ORAL | Status: DC
  Administered 2021-01-03 – 2021-01-09 (×6): 325 mg via ORAL
  Filled 2021-01-02 (×7): qty 1

## 2021-01-02 MED ORDER — APIXABAN 2.5 MG PO TABS: 2.5000 mg | ORAL_TABLET | Freq: Two times a day (BID) | ORAL | Status: DC

## 2021-01-02 MED ORDER — HYDROCODONE-ACETAMINOPHEN 5-325 MG PO TABS
1.0000 | ORAL_TABLET | ORAL | Status: DC | PRN
  Administered 2021-01-03 – 2021-01-04 (×2): 1 via ORAL
  Filled 2021-01-02 (×2): qty 1

## 2021-01-02 MED ORDER — FUROSEMIDE 10 MG/ML IJ SOLN
40.0000 mg | Freq: Once | INTRAMUSCULAR | Status: AC
  Administered 2021-01-02: 40 mg via INTRAVENOUS
  Filled 2021-01-02: qty 4

## 2021-01-02 MED ORDER — FUROSEMIDE 10 MG/ML IJ SOLN
40.0000 mg | Freq: Every day | INTRAMUSCULAR | Status: DC
  Administered 2021-01-03 – 2021-01-04 (×2): 40 mg via INTRAVENOUS
  Filled 2021-01-02 (×3): qty 4

## 2021-01-02 MED ORDER — ONDANSETRON HCL 4 MG/2ML IJ SOLN: 4.0000 mg | Freq: Four times a day (QID) | INTRAMUSCULAR | Status: DC | PRN

## 2021-01-02 MED ORDER — VITAMIN D 25 MCG (1000 UNIT) PO TABS
2000.0000 [IU] | ORAL_TABLET | Freq: Every day | ORAL | Status: DC
  Administered 2021-01-02 – 2021-01-09 (×6): 2000 [IU] via ORAL
  Filled 2021-01-02 (×7): qty 2

## 2021-01-02 MED ORDER — ONDANSETRON HCL 4 MG PO TABS: 4.0000 mg | ORAL_TABLET | Freq: Four times a day (QID) | ORAL | Status: DC | PRN

## 2021-01-02 MED ORDER — VITAMIN B 12 500 MCG PO TABS: ORAL_TABLET | Freq: Every day | ORAL | Status: DC

## 2021-01-02 MED ORDER — ASCORBIC ACID 500 MG PO TABS
1000.0000 mg | ORAL_TABLET | Freq: Every day | ORAL | Status: DC
  Administered 2021-01-02 – 2021-01-09 (×6): 1000 mg via ORAL
  Filled 2021-01-02 (×7): qty 2

## 2021-01-02 MED ORDER — VITAMIN B-12 100 MCG PO TABS
100.0000 ug | ORAL_TABLET | Freq: Every day | ORAL | Status: DC
  Administered 2021-01-02 – 2021-01-09 (×6): 100 ug via ORAL
  Filled 2021-01-02 (×8): qty 1

## 2021-01-02 MED ORDER — FERROUS SULFATE 325 (65 FE) MG PO TBEC: 325.0000 mg | DELAYED_RELEASE_TABLET | Freq: Every day | ORAL | Status: DC

## 2021-01-02 MED ORDER — ACETAMINOPHEN 650 MG RE SUPP: 650.0000 mg | Freq: Four times a day (QID) | RECTAL | Status: DC | PRN

## 2021-01-02 MED ORDER — POLYETHYLENE GLYCOL 3350 17 G PO PACK: 17.0000 g | PACK | Freq: Every day | ORAL | Status: DC | PRN

## 2021-01-02 NOTE — ED Provider Notes (Signed)
Emergency Medicine Provider Triage Evaluation Note  Carrie Monroe , a 85 y.o. female  was evaluated in triage.  Pt complains of generalized weakness over the past 2 weeks. Pt lives alone at home however family comes to visit, were concerned about increased immobility and brought her to the ED. On the way in she hit her left shoulder on the door frame of the car and is now having pain. Also complains of R knee pain, no injury. Family also mentioned pt having bed sores to her bottom that pt states are new and painful.  Review of Systems  Positive: + generalized weakness, L shoulder, R knee pain Negative: - chest pain, SOB, nausea, vomiting, diarrhea  Physical Exam  BP (!) 141/55   Pulse (!) 57   Temp 98.7 F (37.1 C) (Oral)   Resp 16   SpO2 98%  Gen:   Awake, no distress   Resp:  Normal effort  MSK:   Moves extremities without difficulty  Other:  + TTP to left shoulder and R knee  Medical Decision Making  Medically screening exam initiated at 11:24 AM.  Appropriate orders placed.  [name] was informed that the remainder of the evaluation will be completed by another provider, this initial triage assessment does not replace that evaluation, and the importance of remaining in the ED until their evaluation is complete.  Complains of weakness x 2 weeks with new bed sores, decreased mobility, and new pain of L shoulder and R knee pain. Xrays and labs ordered.    Eustaquio Maize, PA-C 01/02/21 1126    Godfrey Pick, MD 01/02/21 1950

## 2021-01-02 NOTE — ED Notes (Signed)
Attempted report 

## 2021-01-02 NOTE — ED Notes (Signed)
Patient transported to X-ray, x-ray called and will transport pt to room

## 2021-01-02 NOTE — ED Triage Notes (Signed)
Patient arrived by [facility] from home where she lives alone. Patient has had increased weakness 1-2 weeks. Reports immobility, complains of left shoulder pain and right knee pain with ROM, denies fall. Alert and oriented. Family reports bed sores to bottom from all the sitting.
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
## 2021-01-02 NOTE — ED Notes (Signed)
Pts bedding changed, brief placed, new dressing on bed sore. Pt repositioned on right side, comfortable and resting currently.
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
## 2021-01-03 DIAGNOSIS — R001 Bradycardia, unspecified: Secondary | ICD-10-CM

## 2021-01-03 DIAGNOSIS — I5043 Acute on chronic combined systolic (congestive) and diastolic (congestive) heart failure: Secondary | ICD-10-CM

## 2021-01-03 DIAGNOSIS — I4819 Other persistent atrial fibrillation: Secondary | ICD-10-CM | POA: Diagnosis not present

## 2021-01-03 DIAGNOSIS — I2510 Atherosclerotic heart disease of native coronary artery without angina pectoris: Secondary | ICD-10-CM

## 2021-01-03 DIAGNOSIS — Z952 Presence of prosthetic heart valve: Secondary | ICD-10-CM

## 2021-01-03 DIAGNOSIS — I447 Left bundle-branch block, unspecified: Secondary | ICD-10-CM

## 2021-01-03 DIAGNOSIS — D649 Anemia, unspecified: Secondary | ICD-10-CM

## 2021-01-03 DIAGNOSIS — I350 Nonrheumatic aortic (valve) stenosis: Secondary | ICD-10-CM

## 2021-01-03 DIAGNOSIS — R5383 Other fatigue: Secondary | ICD-10-CM

## 2021-01-03 LAB — CBC
HCT: 26.4 % — ABNORMAL LOW (ref 36.0–46.0)
Hemoglobin: 8.7 g/dL — ABNORMAL LOW (ref 12.0–15.0)
MCH: 31.1 pg (ref 26.0–34.0)
MCHC: 33 g/dL (ref 30.0–36.0)
MCV: 94.3 fL (ref 80.0–100.0)
Platelets: 135 10*3/uL — ABNORMAL LOW (ref 150–400)
RBC: 2.8 MIL/uL — ABNORMAL LOW (ref 3.87–5.11)
RDW: 14.5 % (ref 11.5–15.5)
WBC: 6.9 10*3/uL (ref 4.0–10.5)
nRBC: 0 % (ref 0.0–0.2)

## 2021-01-03 LAB — COMPREHENSIVE METABOLIC PANEL
ALT: 57 U/L — ABNORMAL HIGH (ref 0–44)
AST: 59 U/L — ABNORMAL HIGH (ref 15–41)
Albumin: 2 g/dL — ABNORMAL LOW (ref 3.5–5.0)
Alkaline Phosphatase: 92 U/L (ref 38–126)
Anion gap: 8 (ref 5–15)
BUN: 41 mg/dL — ABNORMAL HIGH (ref 8–23)
CO2: 26 mmol/L (ref 22–32)
Calcium: 8.8 mg/dL — ABNORMAL LOW (ref 8.9–10.3)
Chloride: 98 mmol/L (ref 98–111)
Creatinine, Ser: 1.62 mg/dL — ABNORMAL HIGH (ref 0.44–1.00)
GFR, Estimated: 32 mL/min — ABNORMAL LOW (ref >60)
Glucose, Bld: 100 mg/dL — ABNORMAL HIGH (ref 70–99)
Potassium: 3.4 mmol/L — ABNORMAL LOW (ref 3.5–5.1)
Sodium: 132 mmol/L — ABNORMAL LOW (ref 135–145)
Total Bilirubin: 0.9 mg/dL (ref 0.3–1.2)
Total Protein: 5.7 g/dL — ABNORMAL LOW (ref 6.5–8.1)

## 2021-01-03 LAB — RETICULOCYTES
Immature Retic Fract: 13.2 % (ref 2.3–15.9)
RBC.: 2.79 MIL/uL — ABNORMAL LOW (ref 3.87–5.11)
Retic Count, Absolute: 64.2 10*3/uL (ref 19.0–186.0)
Retic Ct Pct: 2.3 % (ref 0.4–3.1)

## 2021-01-03 LAB — FOLATE: Folate: 23 ng/mL (ref >5.9)

## 2021-01-03 LAB — C-REACTIVE PROTEIN: CRP: 7.5 mg/dL — ABNORMAL HIGH (ref <1.0)

## 2021-01-03 LAB — IRON AND TIBC
Iron: 29 ug/dL (ref 28–170)
Saturation Ratios: 15 % (ref 10.4–31.8)
TIBC: 199 ug/dL — ABNORMAL LOW (ref 250–450)
UIBC: 170 ug/dL

## 2021-01-03 LAB — VITAMIN B12: Vitamin B-12: 4788 pg/mL — ABNORMAL HIGH (ref 180–914)

## 2021-01-03 LAB — SEDIMENTATION RATE: Sed Rate: 50 mm/hr — ABNORMAL HIGH (ref 0–22)

## 2021-01-03 LAB — APTT: aPTT: 30 seconds (ref 24–36)

## 2021-01-03 LAB — GLUCOSE, CAPILLARY: Glucose-Capillary: 108 mg/dL — ABNORMAL HIGH (ref 70–99)

## 2021-01-03 LAB — FERRITIN: Ferritin: 280 ng/mL (ref 11–307)

## 2021-01-03 MED ORDER — HYDRALAZINE HCL 20 MG/ML IJ SOLN
10.0000 mg | Freq: Four times a day (QID) | INTRAMUSCULAR | Status: DC | PRN
  Filled 2021-01-03: qty 1

## 2021-01-03 MED ORDER — DM-GUAIFENESIN ER 30-600 MG PO TB12: 1.0000 | ORAL_TABLET | Freq: Two times a day (BID) | ORAL | Status: DC | PRN

## 2021-01-03 MED ORDER — GERHARDT'S BUTT CREAM
TOPICAL_CREAM | Freq: Two times a day (BID) | CUTANEOUS | Status: DC
  Filled 2021-01-03: qty 1

## 2021-01-03 MED ORDER — IPRATROPIUM-ALBUTEROL 0.5-2.5 (3) MG/3ML IN SOLN: 3.0000 mL | RESPIRATORY_TRACT | Status: DC | PRN

## 2021-01-03 MED ORDER — POTASSIUM CHLORIDE CRYS ER 20 MEQ PO TBCR
40.0000 meq | EXTENDED_RELEASE_TABLET | Freq: Once | ORAL | Status: AC
  Administered 2021-01-03: 40 meq via ORAL
  Filled 2021-01-03: qty 2

## 2021-01-03 MED ORDER — HEPARIN (PORCINE) 25000 UT/250ML-% IV SOLN
1400.0000 [IU]/h | INTRAVENOUS | Status: DC
  Administered 2021-01-03 (×2): 1000 [IU]/h via INTRAVENOUS
  Filled 2021-01-03: qty 250

## 2021-01-03 MED ORDER — BUPIVACAINE HCL (PF) 0.5 % IJ SOLN
10.0000 mL | Freq: Once | INTRAMUSCULAR | Status: DC
  Filled 2021-01-03: qty 10

## 2021-01-03 MED ORDER — METHYLPREDNISOLONE ACETATE 40 MG/ML IJ SUSP
40.0000 mg | Freq: Once | INTRAMUSCULAR | Status: DC
  Filled 2021-01-03: qty 1

## 2021-01-03 MED ORDER — METOPROLOL TARTRATE 5 MG/5ML IV SOLN: 5.0000 mg | INTRAVENOUS | Status: DC | PRN

## 2021-01-03 MED ORDER — ENSURE ENLIVE PO LIQD
237.0000 mL | Freq: Two times a day (BID) | ORAL | Status: DC
  Administered 2021-01-03 – 2021-01-09 (×11): 237 mL via ORAL

## 2021-01-03 MED ORDER — SENNOSIDES-DOCUSATE SODIUM 8.6-50 MG PO TABS: 1.0000 | ORAL_TABLET | Freq: Every evening | ORAL | Status: DC | PRN

## 2021-01-03 NOTE — Consult Note (Addendum)
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 last saw her, markedly bradycardic, short systolic ejection murmur, scalp wound after surgery Key new findings / data: markedly reduced albumin, moderately elevated creatinine (close to baseline), moderately anemic (normocytic normochromic), elevated BNP  PLAN: - markedly bradycardic due to age related conduction system disease and amiodarone effect. She became seriously ill with tachycardia related CMP due to AFib with uncontrolled RVR until amiodarone was initiated. - she has evidence of CHF decompensation, but is improving with diuretics, probably still mildly hypervolemic - she has lost a lot of true weight and we need to re-establish her "dry weight" - she is  malnourished and hypoalbuminemia and anemia are compounding the HF related volume retention - she is severely deconditioned  - will check ESR/CRP to screen for polymyalgia rheumatica, but prolonged sedentary lifestyle may be a big part of the musculoskeletal issues and severe weakness - investigate anemia - I agree that she would benefit from faster heart rates and better LV synchrony. Agree with Dr. Beckey Downing recommendation for CRT-P (biventricular pacemaker). This will not solve all of her problems, though. She is very reluctant to have any invasive procedures. - one more day of IV diuretic  Sanda Klein, MD, [facility] [phone] 01/03/2021, 3:22 PM

## 2021-01-03 NOTE — Therapy (Signed)
Occupational Therapy Evaluation Patient Details Name: Carrie Monroe MRN: [id number] DOB: 08/14/1935 Today's Date: 01/03/2021    History of Present Illness Pt adm 8/8 with weakness. Pt with progressive immobility in the week prior. Pt with CHF exacerbation. Pt also with lt shoulder pain and rt knee pain. Pt with MASD/ICD to left buttock  PMH - CAD s/p DES PCI (2018), severe AS s/p TAVR (2018), chronic HFpEF, HTN,  DMII, afib, CKD.   Clinical Impression   Pt admitted as above with problems listed below. She currently requires increased assistance with ADL/self care and all functional mobility which is a significant decline in status per pt report over last two weeks. She is overall Mod-max A +2 for bed mobility and pain is a limiting factor. She should benefit from acute OT to assist in maximizing independence with ADL/self care tasks and functional transfers related to ADL's in preparation for next venue.    Follow Up Recommendations  SNF;Supervision/Assistance - 24 hour    Equipment Recommendations  Other (comment) (Defer to next venue)    Recommendations for Other Services       Precautions / Restrictions Precautions Precautions: Fall;Other (comment) Precaution Comments: painful sore on buttocks      Mobility Bed Mobility Overal bed mobility: Needs Assistance Bed Mobility: Supine to Sit     Supine to sit: +2 for physical assistance;Max assist     General bed mobility comments: +2 Mod A, physical assist to roll in bed to reposition and place pillows under right buttocks/side (Pt able to use R UE and rails while rolling)    Transfers   General transfer comment: Pt unable to tolerate transfers secondary to pain in buttocks/right knee. RN gave pain meds and requested OT hold OOB activity until later in the morning today.        ADL either performed or assessed with clinical judgement   ADL Overall ADL's : Needs assistance/impaired Eating/Feeding: Minimal  assistance;Cueing for sequencing;Bed level (Assistance to cut food and Min A to hold utensils. RN feeding pt upon OT arrival) Eating/Feeding Details (indicate cue type and reason): HOB elevated Grooming: Set up;Bed level   Upper Body Bathing: Minimal assistance;Bed level   Lower Body Bathing: Maximal assistance;Bed level;+2 for physical assistance;+2 for safety/equipment (Secondary to pain in buttocks. +2 assist to roll in bed to reposition)   Upper Body Dressing : Minimal assistance;Bed level   Lower Body Dressing: Maximal assistance;+2 for physical assistance;+2 for safety/equipment;Bed level   Toilet Transfer: Moderate assistance;+2 for physical assistance;+2 for safety/equipment (Simulated) Toilet Transfer Details (indicate cue type and reason): Pt pain is limiting factor. RN requests hold OOB activity until pain medications can take effect.. PT made aware Toileting- Clothing Manipulation and Hygiene: Maximal assistance;+2 for physical assistance;+2 for safety/equipment   Tub/ Shower Transfer:  (TBD)   Functional mobility during ADLs:  (Pt is in too much pain, RN aware and asking that OT/PT hold OOB transfer until pain meds take effect later this morning.) General ADL Comments: Pt pain is impacting ability to participate in acute OT assessment this morning. RN gave medication therefore, assessment was limited to bed level and repositioning in bed. Pt was Mod A +2 for bed mobility and rolling to reposition in bed. Pain 8/10     Pertinent Vitals/Pain Pain Assessment: 0-10 Pain Score: 8  Faces Pain Scale: Hurts whole lot Pain Location: buttocks(worst), lt shoulder, rt knee Pain Descriptors / Indicators: Guarding;Grimacing;Discomfort;Moaning Pain Intervention(s): Limited activity within patient's tolerance;Monitored during session;Repositioned;RN gave pain meds during  session     Hand Dominance Right   Extremity/Trunk Assessment Upper Extremity Assessment Upper Extremity  Assessment: Generalized weakness;LUE deficits/detail LUE: Unable to fully assess due to pain (h/o left shoulder pain)   Lower Extremity Assessment Lower Extremity Assessment: Defer to PT evaluation RLE Deficits / Details: Knee limited by pain       Communication Communication Communication: No difficulties   Cognition Arousal/Alertness: Awake/alert Behavior During Therapy: WFL for tasks assessed/performed Overall Cognitive Status: Within Functional Limits for tasks assessed      General Comments: Pt pain is a limiting factor              Home Living Family/patient expects to be discharged to:: Private residence Living Arrangements: Alone Available Help at Discharge: Family;Available PRN/intermittently;Friend(s) Type of Home: House Home Access: Ramped entrance     Home Layout: One level     Bathroom Shower/Tub: Occupational psychologist: Handicapped height     Home Equipment: Bedside commode;Shower seat;Grab bars - tub/shower;Hand held shower head;Transport chair          Prior Functioning/Environment Level of Independence: Independent        Comments: Pt reports until 1 week ago she was ambulating without assistive device. Doesn't drive        OT Problem List: Decreased strength;Decreased activity tolerance;Impaired balance (sitting and/or standing);Decreased knowledge of use of DME or AE;Pain      OT Treatment/Interventions: Self-care/ADL training;Therapeutic exercise;Energy conservation;DME and/or AE instruction;Therapeutic activities;Patient/family education    OT Goals(Current goals can be found in the care plan section) Acute Rehab OT Goals Patient Stated Goal: Decreased pain OT Goal Formulation: Patient unable to participate in goal setting (Secondary to pain) Time For Goal Achievement: 01/17/21 Potential to Achieve Goals: Fair ADL Goals Pt Will Perform Eating: Independently;sitting Pt Will Perform Grooming: Independently;sitting Pt  Will Perform Lower Body Bathing: sitting/lateral leans;sit to/from stand;with supervision;with min assist Pt Will Perform Lower Body Dressing: with supervision;with min assist;sitting/lateral leans;sit to/from stand Pt Will Transfer to Toilet: with modified independence;ambulating;bedside commode Pt Will Perform Toileting - Clothing Manipulation and hygiene: with modified independence;sitting/lateral leans;sit to/from stand Pt Will Perform Tub/Shower Transfer: with min assist;ambulating;shower seat;tub bench;rolling walker Additional ADL Goal #1: Pt will be Mod I UE HEP for strengthening (with written instructions/handout provided).  OT Frequency: Min 2X/week   Barriers to D/C: Other (comment) (Pt lives alone)         AM-PAC OT "6 Clicks" Daily Activity     Outcome Measure Help from another person eating meals?: A Little Help from another person taking care of personal grooming?: A Little Help from another person toileting, which includes using toliet, bedpan, or urinal?: A Lot Help from another person bathing (including washing, rinsing, drying)?: A Lot Help from another person to put on and taking off regular upper body clothing?: A Little Help from another person to put on and taking off regular lower body clothing?: A Lot 6 Click Score: 15   End of Session Nurse Communication: Other (comment) (As per above)  Activity Tolerance: Patient limited by pain;Other (comment) (RN requested OT hold on functional transfers until later this morning secondary to pain. Medication was given) Patient left: in bed;with call bell/phone within reach;Other (comment) (RN in room)  OT Visit Diagnosis: Unsteadiness on feet (R26.81);Other abnormalities of gait and mobility (R26.89);Muscle weakness (generalized) (M62.81);Pain Pain - Right/Left: Right Pain - part of body:  (Buttocks)                Time:  5038-8828 OT Time Calculation (min): 21 min Charges:  OT General Charges $OT Visit: 1 Visit OT  Evaluation $OT Eval Moderate Complexity: 1 Mod  Lindalee Huizinga Beth Dixon, OTR/L 01/03/2021, 1:20 PM

## 2021-01-03 NOTE — Progress Notes (Signed)
ANTICOAGULATION CONSULT NOTE  Pharmacy Consult for heparin Indication: atrial fibrillation  Allergies  Allergen Reactions   Oxycodone Other (See Comments)    Hallunication    Patient Measurements: Height: 5\' 2"  (157.5 cm) Weight: 76.9 kg (169 lb 8.5 oz) IBW/kg (Calculated) : 50.1 Heparin Dosing Weight: 67kg  Vital Signs: Temp: 98.6 F (37 C) (08/09 1221) Temp Source: Oral (08/09 1221) BP: 108/49 (08/09 1221) Pulse Rate: 57 (08/09 1221)  Labs: Recent Labs    01/02/21 1133 01/03/21 0243 01/03/21 1915  HGB 9.1* 8.7*  --   HCT 28.6* 26.4*  --   PLT 147* 135*  --   APTT  --   --  30  CREATININE 1.67* 1.62*  --      Estimated Creatinine Clearance: 25.7 mL/min (A) (by C-G formula based on SCr of 1.62 mg/dL (H)).   Medical History: Past Medical History:  Diagnosis Date   Aortic stenosis, severe    a. 01/2017: s/p TAVR; hospital course complicated by large groin hematoma/wound   Arthritis    "fingers" (11/06/2017)   Cancer of left breast ([place]) 2009   s/p lumpectomy and XRT   Carotid artery stenosis    a. s/p R TCAR 02/2019 followed by [facility].   CKD (chronic kidney disease)    Coronary artery disease    a. 11/2016: diagnosed with multivessel CAD, turned down for CABG and underwent PCI/DES to mLCx, PCI/DES to mLAD and PCTA of ostial diagonal on 12/28/16   Diabetes mellitus type 2, diet-controlled ([place])    Exogenous obesity    Gout    "on daily RX" (11/06/2017)   Heart murmur    Hemorrhoids    HFmrEF (heart failure with mid-range ejection fraction) ([place])    a. 10/2019 Echo: EF 40-45%, glo HK. RVSP 5mmHg. Mildly reduced RV fxn. Sev BAE. Mild to mod MR. Nl fxn AoV prosthesis.   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 Last dose of apixaban 8/8 AM prior to admission. Pharmacy consulted for heparin.    Initial aPTT subtherapeutic at 30 seconds. No infusion issues per nursing.  Goal of Therapy:  Heparin level 0.3-0.7 units/ml Monitor platelets by anticoagulation protocol: Yes   Plan:  Increase heparin to 1250 units/h Recheck aPTT and heparin level in 8h   Arrie Senate, PharmD, [place], [place] Pharmacist [phone] Please check AMION for all [facility] Pharmacy numbers 01/03/2021

## 2021-01-03 NOTE — Consult Note (Addendum)
WOC Nurse Consult Note: Patient receiving care in [facility] Reason for Consult: pressure ulcer Wound type: MASD/IAD to the left buttock. Open ulcer measures 3 cm x 3 cm and is red/pink moist and painful Dressing procedure/placement/frequency: Clean the sacral area with no rinse cleanser, dry and apply Gerhardt's butt cream to the entire area BID or PRN soiling  ICD-10 CM Codes for Irritant Dermatitis L24A2 - Due to fecal, urinary or dual incontinence  Pressure Injury Prevention Bundle Support surfaces (air mattress) chair cushion Kellie Simmering # [phone]) Heel offloading boots Kellie Simmering # [phone]) Turning and Positioning  Measures to reduce shear (draw sheet, knees up) Skin protection Products (Foam dressing) Moisture management products (Critic-Aid Barrier Cream (Purple top) Nutrition Management Protection for Medical Devices Routine Skin Assessment   Monitor the wound area(s) for worsening of condition such as: Signs/symptoms of infection, increase in size, development of or worsening of odor, development of pain, or increased pain at the affected locations.   Notify the medical team if any of these develop.  Thank you for the consult. [place] nurse will not follow at this time.   Please re-consult the [place] team if needed.  Cathlean Marseilles Tamala Julian, MSN, RN, [place], [place], [facility] Wound Treatment Associate Pager [phone]

## 2021-01-03 NOTE — Evaluation (Signed)
Physical Therapy Evaluation Patient Details Name: Carrie Monroe MRN: [id number] DOB: 10-01-1935 Today's Date: 01/03/2021   History of Present Illness  Pt adm 8/8 with weakness. Pt with progressive immobility in the week prior. Pt with CHF exacerbation. Pt also with lt shoulder pain and rt knee pain. Pt with MASD/ICD to left buttock  PMH - CAD s/p DES PCI (2018), severe AS s/p TAVR (2018), chronic HFpEF, HTN,  DMII, afib, CKD.  Clinical Impression  Pt with significant decline in mobility in recent week. Requiring 2 person assist for any mobility and unable to ambulate. Pt will need SNF as she is unable to care for herself until her mobility improves.     Follow Up Recommendations SNF    Equipment Recommendations  Rolling walker with 5" wheels    Recommendations for Other Services       Precautions / Restrictions Precautions Precautions: Fall;Other (comment) Precaution Comments: painful sore on buttocks      Mobility  Bed Mobility Overal bed mobility: Needs Assistance Bed Mobility: Supine to Sit     Supine to sit: +2 for physical assistance;Max assist     General bed mobility comments: Assist to bring RLE off of bed, elevate trunk into sitting and bring hips to EOB    Transfers Overall transfer level: Needs assistance Equipment used: Ambulation equipment used;Rolling walker (2 wheeled) Transfers: Sit to/from Stand Sit to Stand: +2 physical assistance;Mod assist         General transfer comment: Assist to bring hips up. Stood from bed with Stedy and used Stedy for bed to chair. From chair stood with walker. Pt with flexed posture and needed tactile/verbal cues to stand more erect.  Ambulation/Gait             General Gait Details: stood with walker but unable to take any steps  Stairs            Wheelchair Mobility    Modified Rankin (Stroke Patients Only)       Balance Overall balance assessment: Needs assistance Sitting-balance support:  Bilateral upper extremity supported Sitting balance-Leahy Scale: Poor Sitting balance - Comments: UE support and min guard Postural control: Posterior lean Standing balance support: Bilateral upper extremity supported Standing balance-Leahy Scale: Poor Standing balance comment: Stood x 2 with Stedy and x 1 with rolling walker. Min assist to maintain static stand but unable to advance either leg to take steps                             Pertinent Vitals/Pain Pain Assessment: Faces Faces Pain Scale: Hurts whole lot Pain Location: buttocks(worst), lt shoulder, rt knee Pain Descriptors / Indicators: Guarding;Grimacing Pain Intervention(s): Limited activity within patient's tolerance;Monitored during session;Repositioned    Home Living Family/patient expects to be discharged to:: Private residence Living Arrangements: Alone Available Help at Discharge: Family;Available PRN/intermittently;Friend(s) Type of Home: House Home Access: Ramped entrance     Home Layout: One level Home Equipment: Bedside commode;Shower seat;Grab bars - tub/shower;Hand held shower head;Transport chair      Prior Function Level of Independence: Independent         Comments: Pt reports until 1 week ago she was ambulating without assistive device. Doesn't drive     Hand Dominance   Dominant Hand: Right    Extremity/Trunk Assessment   Upper Extremity Assessment Upper Extremity Assessment: Defer to OT evaluation    Lower Extremity Assessment Lower Extremity Assessment: Generalized weakness;RLE deficits/detail RLE Deficits /  Details: Knee limited by pain       Communication   Communication: No difficulties  Cognition Arousal/Alertness: Awake/alert Behavior During Therapy: WFL for tasks assessed/performed Overall Cognitive Status: Within Functional Limits for tasks assessed                                        General Comments      Exercises      Assessment/Plan    PT Assessment Patient needs continued PT services  PT Problem List Decreased strength;Decreased balance;Pain;Decreased activity tolerance       PT Treatment Interventions DME instruction;Functional mobility training;Balance training;Patient/family education;Gait training;Therapeutic activities;Therapeutic exercise    PT Goals (Current goals can be found in the Care Plan section)  Acute Rehab PT Goals Patient Stated Goal: decr pain PT Goal Formulation: With patient Time For Goal Achievement: 01/17/21 Potential to Achieve Goals: Fair    Frequency Min 3X/week   Barriers to discharge Decreased caregiver support lives alone    Co-evaluation               AM-PAC PT "6 Clicks" Mobility  Outcome Measure Help needed turning from your back to your side while in a flat bed without using bedrails?: Total Help needed moving from lying on your back to sitting on the side of a flat bed without using bedrails?: Total Help needed moving to and from a bed to a chair (including a wheelchair)?: Total Help needed standing up from a chair using your arms (e.g., wheelchair or bedside chair)?: Total Help needed to walk in hospital room?: Total Help needed climbing 3-5 steps with a railing? : Total 6 Click Score: 6    End of Session   Activity Tolerance: Patient limited by pain Patient left: in chair;with call bell/phone within reach;with chair alarm set Nurse Communication: Mobility status;Need for lift equipment PT Visit Diagnosis: Other abnormalities of gait and mobility (R26.89);Muscle weakness (generalized) (M62.81);Pain Pain - Right/Left: Left Pain - part of body: Shoulder (rt knee, and buttocks)    Time: 6283-6629 PT Time Calculation (min) (ACUTE ONLY): 22 min   Charges:   PT Evaluation $PT Eval Moderate Complexity: 1 [place] Pager [phone] Office [place] 01/03/2021,  12:09 PM

## 2021-01-03 NOTE — Progress Notes (Addendum)
PROGRESS NOTE    Carrie Monroe  QQP:[id number] DOB: 15-Dec-1935 DOA: 01/02/2021 PCP: Carrie Organ., MD   Brief Narrative:  85 year old with history of severe aortic stenosis status post TAVR, CAD status post PCI, CKD, CHF, left breast cancer, carotid artery stenosis status post CEA, DM2 diet controlled, renal stones, HLD, HTN, squamous cell carcinoma of scalp presented to ED with severe fatigue and weakness for several weeks.  No longer able to participate in her ADL S.  She was seen by [facility] cardiology on 7/27 with plan for biventricular pacemaker placement.  Cardiology team has been consulted.   Assessment & Plan:   Active Problems:   Volume overload   Acute congestive heart failure with reduced ejection fraction, 45%, class III Severe fatigue and generalized weakness -Getting IV Lasix 40 mg daily.  Monitor input and output.  1500 cc fluid restriction - Consult cardiology due to advanced medical issues - Monitor electrolytes  History of severe aortic stenosis status post TAVR- 2018 -Supportive care.  Cardiology consulted  History of CAD status post PCI B/l Carotid Atery Disease s/p resvasc 2020 -Currently chest pain-free.  On Eliquis and statin  Diabetes mellitus type 2 - Diet controlled.  Sliding scale and Accu-Cheks  Essential hypertension -Not on medications due to low BPs  Atrial fibrillation, permanent - On home amiodarone.  Eliquis currently on hold, in case if she needs pacemaker placement procedure.  In the meantime we will start her on heparin drip. Need to get PFTs and DLCO at somepoint outpatient   Hyperlipidemia - Crestor  CKD CKD Stage 3a -Baseline creatinine 1.6.  Closely continue to monitor.  Anemia of chronic disease - Baseline hemoglobin 9.0 - Admission hemoglobin 8.7.  Continue to monitor this. 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   Lesion appears to be deeper than initially expected there is ongoing discussion with Carrie Monroe at [facility] for systemic chemo with radiation treatment  Shoulder and Knee Pain. Moderate Knee effusion Rib fracture -consulted orthopedic PA Carrie Monroe, pain control. May need join aspiration. Will see the patient tomorrow. Rule out infection vs hemarthrosis. Pain control.     DVT prophylaxis: Heparin drip, on Eliquis at home Code Status: Full code Family Communication: called multiple family members multiple times, no answer thus far. Finally [name] answer so updated her.   Status is: Inpatient  Remains inpatient appropriate because:Inpatient level of care appropriate due to severity of illness  Dispo: The patient is from: Home              Anticipated d/c is to: Home              Patient currently is not medically stable to d/c.   Difficult to place patient No       Nutritional status           Body mass index is 31.01 kg/m.  Pressure Injury 03/03/20 Sacrum Mid Stage 2 -  Partial thickness loss of dermis presenting as a shallow open injury with a red, pink wound bed without slough. (Active)  03/03/20 0226  Location: Sacrum  Location Orientation: Mid  Staging: Stage 2 -  Partial thickness loss of dermis presenting as a shallow open injury with a red, pink wound bed without slough.  Wound Description (Comments):   Present on Admission: Yes     Pressure Injury 01/03/21 Thigh Posterior;Right Stage 2 -  Partial thickness loss of dermis  presenting as a shallow open injury with a red, pink wound bed without slough. (Active)  01/03/21 0048  Location: Thigh  Location Orientation: Posterior;Right  Staging: Stage 2 -  Partial thickness loss of dermis presenting as a shallow open injury with a red, pink wound bed without slough.  Wound Description (Comments):   Present on Admission: Yes     Pressure Injury 01/03/21 Coccyx Medial Unstageable - Full thickness tissue loss in which the  base of the injury is covered by slough (yellow, tan, gray, green or brown) and/or eschar (tan, brown or black) in the wound bed. (Active)  01/03/21 0050  Location: Coccyx  Location Orientation: Medial  Staging: Unstageable - Full thickness tissue loss in which the base of the injury is covered by slough (yellow, tan, gray, green or brown) and/or eschar (tan, brown or black) in the wound bed.  Wound Description (Comments):   Present on Admission: Yes          Subjective: Patient is sitting up in the chair, reports of feeling fatigue at home but currently she does not have any symptoms while she is sitting at rest.  Denies any chest pain, shortness of breath, lightheadedness, dizziness. Patient and family denies any trauma.   Review of Systems Otherwise negative except as per HPI, including: General: Denies fever, chills, night sweats or unintended weight loss. Resp: Denies cough, wheezing, shortness of breath. Cardiac: Denies chest pain, palpitations, orthopnea, paroxysmal nocturnal dyspnea. GI: Denies abdominal pain, nausea, vomiting, diarrhea or constipation GU: Denies dysuria, frequency, hesitancy or incontinence MS: Denies muscle aches, joint pain or swelling Neuro: Denies headache, neurologic deficits (focal weakness, numbness, tingling), abnormal gait Psych: Denies anxiety, depression, SI/HI/AVH Skin: Denies new rashes or lesions ID: Denies sick contacts, exotic exposures, travel  Examination:  General exam: Appears calm and comfortable, elderly frail Respiratory system: Clear to auscultation. Respiratory effort normal. Cardiovascular system: S1 & S2 heard, RRR. No JVD, murmurs, rubs, gallops or clicks. No pedal edema. Gastrointestinal system: Abdomen is nondistended, soft and nontender. No organomegaly or masses felt. Normal bowel sounds heard. Central nervous system: Alert and oriented. No focal neurological deficits. Extremities: Symmetric 5 x 5 power. Skin: No  rashes, lesions or ulcers Psychiatry: Judgement and insight appear normal. Mood & affect appropriate.     Objective: Vitals:   01/03/21 0018 01/03/21 0020 01/03/21 0317 01/03/21 0500  BP: (!) 169/59 (!) 169/59 (!) 138/53   Pulse: 64 62 (!) 55   Resp: _0 Temp: 98.7 F (37.1 C)  99.4 F (37.4 C)   TempSrc: Oral  Oral   SpO2: 95% 96% 95%   Weight: 76.9 kg   76.9 kg  Height: 5' 2" (1.575 m)       Intake/Output Summary (Last 24 hours) at 01/03/2021 0809 Last data filed at 01/02/2021 2045 Gross per 24 hour  Intake --  Output 3000 ml  Net -3000 ml   Filed Weights   01/03/21 0018 01/03/21 0500  Weight: 76.9 kg 76.9 kg     Data Reviewed:   CBC: Recent Labs  Lab 01/02/21 1133 01/03/21 0243  WBC 7.6 6.9  NEUTROABS 6.3  --   HGB 9.1* 8.7*  HCT 28.6* 26.4*  MCV 97.3 94.3  PLT 147* 283*   Basic Metabolic Panel: Recent Labs  Lab 01/02/21 1133 01/03/21 0243  NA 134* 132*  K 3.9 3.4*  CL 101 98  CO2 25 26  GLUCOSE 115* 100*  BUN 42* 41*  CREATININE 1.67* 1.62*  CALCIUM 8.9 8.8*   GFR: Estimated Creatinine Clearance: 25.7 mL/min (A) (by C-G formula based on SCr of 1.62 mg/dL (H)). Liver Function Tests: Recent Labs  Lab 01/02/21 1133 01/03/21 0243  AST 69* 59*  ALT 61* 57*  ALKPHOS 92 92  BILITOT 1.1 0.9  PROT 6.2* 5.7*  ALBUMIN 2.2* 2.0*   No results for input(s): LIPASE, AMYLASE in the last 168 hours. No results for input(s): AMMONIA in the last 168 hours. Coagulation Profile: No results for input(s): INR, PROTIME in the last 168 hours. Cardiac Enzymes: No results for input(s): CKTOTAL, CKMB, CKMBINDEX, TROPONINI in the last 168 hours. BNP (last 3 results) No results for input(s): PROBNP in the last 8760 hours. HbA1C: No results for input(s): HGBA1C in the last 72 hours. CBG: Recent Labs  Lab 01/03/21 0022  GLUCAP 108*   Lipid Profile: No results for input(s): CHOL, HDL, LDLCALC, TRIG, CHOLHDL, LDLDIRECT in the last 72 hours. Thyroid  Function Tests: No results for input(s): TSH, T4TOTAL, FREET4, T3FREE, THYROIDAB in the last 72 hours. Anemia Panel: No results for input(s): VITAMINB12, FOLATE, FERRITIN, TIBC, IRON, RETICCTPCT in the last 72 hours. Sepsis Labs: No results for input(s): PROCALCITON, LATICACIDVEN in the last 168 hours.  Recent Results (from the past 240 hour(s))  Resp Panel by RT-PCR (Flu A&B, Covid) Nasopharyngeal Swab     Status: None   Collection Time: 01/02/21  2:07 PM   Specimen: Nasopharyngeal Swab; Nasopharyngeal(NP) swabs in vial transport medium  Result Value Ref Range Status   SARS Coronavirus 2 by RT PCR NEGATIVE NEGATIVE Final    Comment: (NOTE) SARS-CoV-2 target nucleic acids are NOT DETECTED.  The SARS-CoV-2 RNA is generally detectable in upper respiratory specimens during the acute phase of infection. The lowest concentration of SARS-CoV-2 viral copies this assay can detect is 138 copies/mL. A negative result does not preclude SARS-Cov-2 infection and should not be used as the sole basis for treatment or other patient management decisions. A negative result may occur with  improper specimen collection/handling, submission of specimen other than nasopharyngeal swab, presence of viral mutation(s) within the areas targeted by this assay, and inadequate number of viral copies(<138 copies/mL). A negative result must be combined with clinical observations, patient history, and epidemiological information. The expected result is Negative.  Fact Sheet for Patients:  EntrepreneurPulse.com.au  Fact Sheet for Healthcare Providers:  IncredibleEmployment.be  This test is no t yet approved or cleared by the [place] FDA and  has been authorized for detection and/or diagnosis of SARS-CoV-2 by FDA under an Emergency Use Authorization (EUA). This EUA will remain  in effect (meaning this test can be used) for the duration of the COVID-19 declaration under  Section 564(b)(1) of the Act, 21 U.S.C.section 360bbb-3(b)(1), unless the authorization is terminated  or revoked sooner.       Influenza A by PCR NEGATIVE NEGATIVE Final   Influenza B by PCR NEGATIVE NEGATIVE Final    Comment: (NOTE) The Xpert Xpress SARS-CoV-2/FLU/RSV plus assay is intended as an aid in the diagnosis of influenza from Nasopharyngeal swab specimens and should not be used as a sole basis for treatment. Nasal washings and aspirates are unacceptable for Xpert Xpress SARS-CoV-2/FLU/RSV testing.  Fact Sheet for Patients: EntrepreneurPulse.com.au  Fact Sheet for Healthcare Providers: IncredibleEmployment.be  This test is not yet approved or cleared by the [place] FDA and has been authorized for detection and/or diagnosis of SARS-CoV-2 by FDA under an Emergency Use Authorization (EUA). This EUA will remain in effect (meaning  this test can be used) for the duration of the COVID-19 declaration under Section 564(b)(1) of the Act, 21 U.S.C. section 360bbb-3(b)(1), unless the authorization is terminated or revoked.  Performed at [facility], [place] [address] [place], [place] [address]          Radiology Studies: DG Chest 1 View  Result Date: 01/02/2021 CLINICAL DATA:  Generalized weakness over the last 2 weeks. Left shoulder struck on door frame this morning. EXAM: CHEST  1 VIEW COMPARISON:  03/02/2020 FINDINGS: Mild to moderate enlargement of the cardiopericardial silhouette without evidence of overt edema. Prior TAVR. Atherosclerotic calcification of the aortic arch. Mitral valve calcification. Chronic rim calcified lesion or structure in the left breast as shown on prior exams such 01/10/2017. Suspected right distal clavicular resection. No discrete airspace opacity is identified. IMPRESSION: 1. Mild to moderate enlargement of the cardiopericardial silhouette, without edema or airspace opacity identified. 2. Prior TAVR.   Mitral valve calcification. 3. Chronic calcified lesion in the left breast, not changed from prior exams. 4.  Aortic Atherosclerosis (ICD10-I70.0). Electronically Signed   By: Van Clines M.D.   On: 01/02/2021 12:38   DG Shoulder Left  Result Date: 01/02/2021 CLINICAL DATA:  Struck left shoulder on door frame this morning. EXAM: LEFT SHOULDER - 2+ VIEW COMPARISON:  Chest radiograph 03/02/2020 FINDINGS: Degenerative glenohumeral spurring. Substantially reduced acromiohumeral distance with subacromial spurring, raising the likelihood of chronic supraspinatus tendon tear. Mild degenerative AC joint spurring. Subtle deformity of the left third rib suspicious for nondisplaced fracture. Atherosclerotic calcification of the aortic arch. IMPRESSION: 1. Subtle deformity of the left third rib suspicious for nondisplaced fracture. 2. Degenerative glenohumeral spurring and degenerative AC joint spurring. 3. Subacromial spur with markedly reduced acromial humeral space favoring chronic supraspinatus tendon tear. 4.  Aortic Atherosclerosis (ICD10-I70.0). Electronically Signed   By: Van Clines M.D.   On: 01/02/2021 12:42   DG Knee Complete 4 Views Right  Result Date: 01/02/2021 CLINICAL DATA:  Weakness.  Right knee pain EXAM: RIGHT KNEE - COMPLETE 4+ VIEW COMPARISON:  Report from 11/02/2002 FINDINGS: SFA and popliteal artery atherosclerotic calcification. Medial compartmental arthroplasty. Lateral meniscal chondrocalcinosis. There is a moderate knee joint effusion. Marginal spurring in the patellofemoral and lateral compartment. Questionable slight medial tibial plateau volume loss along the prosthesis, likely chronic. I do not observe a fracture or an acute bony finding. IMPRESSION: 1. Moderate knee joint effusion. 2. Chondrocalcinosis with spurring in the lateral compartment and patellofemoral joint. 3. Medial compartmental arthroplasty, equivocal mild volume loss in the medial tibial plateau but below the  tibial tray component. Electronically Signed   By: Van Clines M.D.   On: 01/02/2021 12:53        Scheduled Meds:  amiodarone  200 mg Oral Daily   vitamin C  1,000 mg Oral Daily   cholecalciferol  2,000 Units Oral Daily   ferrous sulfate  325 mg Oral Q breakfast   furosemide  40 mg Intravenous Daily   Gerhardt's butt cream   Topical BID   rosuvastatin  10 mg Oral Daily   vitamin B-12  100 mcg Oral Daily   Continuous Infusions:   LOS: 1 day   Time spent= 35 mins    Wells Gerdeman Arsenio Loader, MD [facility]  If 7PM-7AM, please contact night-coverage  01/03/2021, 8:09 AM

## 2021-01-03 NOTE — Progress Notes (Signed)
ANTICOAGULATION CONSULT NOTE - Initial Consult  Pharmacy Consult for heparin Indication: atrial fibrillation  Allergies  Allergen Reactions   Oxycodone Other (See Comments)    Hallunication    Patient Measurements: Height: 5\' 2"  (157.5 cm) Weight: 76.9 kg (169 lb 8.5 oz) IBW/kg (Calculated) : 50.1 Heparin Dosing Weight: 67kg  Vital Signs: Temp: 99.4 F (37.4 C) (08/09 0317) Temp Source: Oral (08/09 0317) BP: 138/53 (08/09 0317) Pulse Rate: 55 (08/09 0317)  Labs: Recent Labs    01/02/21 1133 01/03/21 0243  HGB 9.1* 8.7*  HCT 28.6* 26.4*  PLT 147* 135*  CREATININE 1.67* 1.62*    Estimated Creatinine Clearance: 25.7 mL/min (A) (by C-G formula based on SCr of 1.62 mg/dL (H)).   Medical History: Past Medical History:  Diagnosis Date   Aortic stenosis, severe    a. 01/2017: s/p TAVR; hospital course complicated by large groin hematoma/wound   Arthritis    "fingers" (11/06/2017)   Cancer of left breast ([place]) 2009   s/p lumpectomy and XRT   Carotid artery stenosis    a. s/p R TCAR 02/2019 followed by [facility].   CKD (chronic kidney disease)    Coronary artery disease    a. 11/2016: diagnosed with multivessel CAD, turned down for CABG and underwent PCI/DES to mLCx, PCI/DES to mLAD and PCTA of ostial diagonal on 12/28/16   Diabetes mellitus type 2, diet-controlled ([place])    Exogenous obesity    Gout    "on daily RX" (11/06/2017)   Heart murmur    Hemorrhoids    HFmrEF (heart failure with mid-range ejection fraction) ([place])    a. 10/2019 Echo: EF 40-45%, glo HK. RVSP 57mmHg. Mildly reduced RV fxn. Sev BAE. Mild to mod MR. Nl fxn AoV prosthesis.   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   26 mm Medtronic CorValve Evolut Pro transcatheter heart valve placed  via percutaneous left transfemoral approach    Squamous carcinoma 10/2017   "scalp"   Thrombocytopenia ([place])    Assessment: 85 yo W on apixaban PTA for afib now being held for possible PPM placement. Last dose of apixaban 8/8 AM prior to admission. Pharmacy consulted for heparin.    ClCr ~25 ml/min, anticipate apixaban will be in system for several days. HR controlled. H/H anemic at baseline, plt 130s.    Goal of Therapy:  Heparin level 0.3-0.7 units/ml Monitor platelets by anticoagulation protocol: Yes   Plan:  Heparin 1000 units/hr, no bolus  F/u 8hr aPTT  F/u aPTT until correlates with heparin level  Monitor daily aPTT, HL, CBC/plt Monitor for signs/symptoms of bleeding  F/u restart apixaban    Benetta Spar, PharmD, BCPS, BCCP Clinical Pharmacist  Please check AMION for all [place] phone numbers After 10:00 PM, call [place]

## 2021-01-03 NOTE — Progress Notes (Signed)
Patient's son wanted to speak to Charge RN about the care patient is receiving on the unit. Patient's son was concern about staff rounding after the patient was sitting in the chair, transferred by PT/OT and "pad was wet". There was no purwick on the patient and the call bell was not within reach. Charge RN apologize to the patient in regards to the call bell not being within reach and that the nursing staff does try to do purposeful rounding. Patient's son expresses understanding and Charge RN express to the son that he is welcome to talk to any of the Charge RNs and leadership if he would like.
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
## 2021-01-04 DIAGNOSIS — I447 Left bundle-branch block, unspecified: Secondary | ICD-10-CM

## 2021-01-04 DIAGNOSIS — I495 Sick sinus syndrome: Secondary | ICD-10-CM

## 2021-01-04 DIAGNOSIS — I5043 Acute on chronic combined systolic (congestive) and diastolic (congestive) heart failure: Secondary | ICD-10-CM | POA: Diagnosis not present

## 2021-01-04 DIAGNOSIS — M25561 Pain in right knee: Secondary | ICD-10-CM

## 2021-01-04 DIAGNOSIS — Z952 Presence of prosthetic heart valve: Secondary | ICD-10-CM | POA: Diagnosis not present

## 2021-01-04 DIAGNOSIS — I480 Paroxysmal atrial fibrillation: Secondary | ICD-10-CM

## 2021-01-04 LAB — BASIC METABOLIC PANEL
Anion gap: 6 (ref 5–15)
BUN: 47 mg/dL — ABNORMAL HIGH (ref 8–23)
CO2: 27 mmol/L (ref 22–32)
Calcium: 8.6 mg/dL — ABNORMAL LOW (ref 8.9–10.3)
Chloride: 102 mmol/L (ref 98–111)
Creatinine, Ser: 1.94 mg/dL — ABNORMAL HIGH (ref 0.44–1.00)
GFR, Estimated: 25 mL/min — ABNORMAL LOW (ref >60)
Glucose, Bld: 169 mg/dL — ABNORMAL HIGH (ref 70–99)
Potassium: 4.2 mmol/L (ref 3.5–5.1)
Sodium: 135 mmol/L (ref 135–145)

## 2021-01-04 LAB — HEPARIN LEVEL (UNFRACTIONATED): Heparin Unfractionated: >1.1 IU/mL — ABNORMAL HIGH (ref 0.30–0.70)

## 2021-01-04 LAB — SYNOVIAL CELL COUNT + DIFF, W/ CRYSTALS
Eosinophils-Synovial: 0 % (ref 0–1)
Lymphocytes-Synovial Fld: 3 % (ref 0–20)
Monocyte-Macrophage-Synovial Fluid: 4 % — ABNORMAL LOW (ref 50–90)
Neutrophil, Synovial: 93 % — ABNORMAL HIGH (ref 0–25)
WBC, Synovial: 4935 /mm3 — ABNORMAL HIGH (ref 0–200)

## 2021-01-04 LAB — CBC
HCT: 24.7 % — ABNORMAL LOW (ref 36.0–46.0)
Hemoglobin: 8 g/dL — ABNORMAL LOW (ref 12.0–15.0)
MCH: 30.7 pg (ref 26.0–34.0)
MCHC: 32.4 g/dL (ref 30.0–36.0)
MCV: 94.6 fL (ref 80.0–100.0)
Platelets: 135 10*3/uL — ABNORMAL LOW (ref 150–400)
RBC: 2.61 MIL/uL — ABNORMAL LOW (ref 3.87–5.11)
RDW: 14.7 % (ref 11.5–15.5)
WBC: 7.4 10*3/uL (ref 4.0–10.5)
nRBC: 0 % (ref 0.0–0.2)

## 2021-01-04 LAB — MAGNESIUM: Magnesium: 2 mg/dL (ref 1.7–2.4)

## 2021-01-04 LAB — TSH: TSH: 1.558 u[IU]/mL (ref 0.350–4.500)

## 2021-01-04 LAB — APTT: aPTT: 51 seconds — ABNORMAL HIGH (ref 24–36)

## 2021-01-04 MED ORDER — VANCOMYCIN HCL 750 MG/150ML IV SOLN: 750.0000 mg | INTRAVENOUS | Status: DC

## 2021-01-04 MED ORDER — VANCOMYCIN HCL 1500 MG/300ML IV SOLN
1500.0000 mg | Freq: Once | INTRAVENOUS | Status: AC
  Administered 2021-01-04: 1500 mg via INTRAVENOUS
  Filled 2021-01-04: qty 300

## 2021-01-04 MED ORDER — APIXABAN 2.5 MG PO TABS
2.5000 mg | ORAL_TABLET | Freq: Two times a day (BID) | ORAL | Status: DC
  Administered 2021-01-04 – 2021-01-09 (×12): 2.5 mg via ORAL
  Filled 2021-01-04 (×12): qty 1

## 2021-01-04 MED ORDER — SODIUM CHLORIDE 0.9 % IV SOLN
2.0000 g | INTRAVENOUS | Status: DC
  Administered 2021-01-04: 2 g via INTRAVENOUS
  Filled 2021-01-04 (×2): qty 20

## 2021-01-04 NOTE — Progress Notes (Signed)
Pharmacy Antibiotic Note  Carrie Monroe is a 85 y.o. female admitted on 01/02/2021 with  septic arthritis . Pharmacy has been consulted for vancomycin dosing. Ceftriaxone ordered by MD. Synovial fluid gram stain/culture pending. Cr worsening this morning 1.6 to 1.9.   Plan: Vancomycin 1500mg  IV x1 then 750mg  IV q48h Ceftriaxone per MD Follow Cr closely F/U culture studies   Height: 5\' 2"  (157.5 cm) Weight: 80.3 kg (177 lb) IBW/kg (Calculated) : 50.1  Temp (24hrs), Avg:98.8 F (37.1 C), Min:98.6 F (37 C), Max:99.1 F (37.3 C)  Recent Labs  Lab 01/02/21 1133 01/03/21 0243 01/04/21 0503  WBC 7.6 6.9 7.4  CREATININE 1.67* 1.62* 1.94*    Estimated Creatinine Clearance: 22 mL/min (A) (by C-G formula based on SCr of 1.94 mg/dL (H)).    Allergies  Allergen Reactions   Oxycodone Other (See Comments)    Hallunication    Antimicrobials this admission: Ceftriaxone 8/10 >> Vancomycin 8/10 >>  Microbiology results: Pending   Thank you for allowing pharmacy to be a part of this patient's care.  Arrie Senate, PharmD, BCPS, [facility] Clinical Pharmacist [phone] Please check AMION for all [place] numbers 01/04/2021

## 2021-01-04 NOTE — TOC Initial Note (Signed)
Transition of Care [facility]) - Initial/Assessment Note    Patient Details  Name: Carrie Monroe MRN: [id number] Date of Birth: [birth year]  Transition of Care [facility]) CM/SW Contact:    Tresa Endo Phone Number: 01/04/2021, 5:14 PM  Clinical Narrative:                 CSW received SNF consult. CSW met with pt and son at bedside. CSW introduced self and explained role at the hospital. Pt reports that PTA the pt was living at home alone. PT reports pt needs maxA+2 assist. PT reports that pt limited by significant pain, fatigue, generalized weakness and decreased activity tolerance. Recommend use of maximove lift for OOB with nursing staff.  CSW reviewed PT/OT recommendations for SNF. Pt family reports pt is in extreme need of SNF. Pt gave CSW permission to fax out to facilities in the area. Pt family would like [employer] because pt daughter in law works there. CSW gave pt medicare.gov rating list to review. PT reports they are covid negative, no vaccinations on pt chart.  CSW will continue to follow.    Expected Discharge Plan: Skilled Nursing Facility Barriers to Discharge: Continued Medical Work up   Patient Goals and CMS Choice Patient states their goals for this hospitalization and ongoing recovery are:: Rehab CMS Medicare.gov Compare Post Acute Care list provided to:: Patient Choice offered to / list presented to : Patient  Expected Discharge Plan and Services Expected Discharge Plan: [place] In-house Referral: Clinical Social Work Discharge Planning Services: NA Post Acute Care Choice: [place] Living arrangements for the past 2 months: [place]                                      Prior Living Arrangements/Services Living arrangements for the past 2 months: [place] Lives with:: Self Patient language and need for interpreter reviewed:: Yes Do you feel safe going back to the place  where you live?: Yes        Care giver support system in place?: Yes (comment)   Criminal Activity/Legal Involvement Pertinent to Current Situation/Hospitalization: No - Comment as needed  Activities of Daily Living Home Assistive Devices/Equipment: None ADL Screening (condition at time of admission) Patient's cognitive ability adequate to safely complete daily activities?: Yes Patient able to express need for assistance with ADLs?: Yes Independently performs ADLs?: No  Permission Sought/Granted Permission sought to share information with : Family Supports, Chartered certified accountant granted to share information with : Yes, Verbal Permission Granted  Share Information with NAME: Kandise, Riehle (Relative)   [phone]  Permission granted to share info w AGENCY: SNF  Permission granted to share info w Relationship: Love, Chowning (Relative)   [phone]  Permission granted to share info w Contact Information: Coryn, Mosso (Relative)   [phone]  Emotional Assessment Appearance:: Appears stated age Attitude/Demeanor/Rapport: Engaged Affect (typically observed): Appropriate Orientation: : Oriented to Self, Oriented to Place, Oriented to  Time, Oriented to Situation Alcohol / Substance Use: Not Applicable Psych Involvement: No (comment)  Admission diagnosis:  Pain [R52] Volume overload [E87.70] Other fatigue [R53.83] Patient Active Problem List   Diagnosis Date Noted   Paroxysmal atrial fibrillation ([place])    Tachycardia-bradycardia syndrome ([place])    LBBB (left bundle branch block)    Volume overload 01/02/2021   Dizziness 07/14/2020   Biventricular failure ([place]) 07/14/2020   Pressure  injury of skin 03/03/2020   Acute on chronic combined systolic and diastolic CHF (congestive heart failure) ([place]) 03/02/2020   AKI (acute kidney injury) ([place]) 03/02/2020   Thrombocytopenia ([place]) 03/02/2020   Secondary hypercoagulable state ([place]) 01/13/2020    Persistent atrial fibrillation (HCC)    Hypoxia    Acute on chronic diastolic CHF (congestive heart failure), NYHA class 3 ([place]) 11/21/2019   Atrial fibrillation with rapid ventricular response ([place]) 11/21/2019   Fever    Carotid stenosis 03/13/2019   Encounter for medication monitoring 11/27/2017   PICC (peripherally inserted central catheter) in place 11/27/2017   MSSA bacteremia 11/05/2017   Wound infection after surgery 11/04/2017   Anemia 09/27/2017   Hyperlipidemia    History of kidney stones    Hemorrhoids    Exogenous obesity    Chronic diastolic CHF (congestive heart failure) (HCC)    Arthritis    Aortic stenosis, severe    Chronic respiratory failure with hypoxia (HCC)    Hemorrhagic shock (HCC)    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   Carotid artery stenosis    Three-vessel CAD-PCI to LAD and LCx, med management of RCA    Chronic renal insufficiency, stage III (moderate) ([place]) 12/22/2016   Dental abscess- s/p multiple tooth extraction 12/21/16 12/22/2016   CAD- severe 3V CAD     Essential hypertension 01/27/2016   Morbid obesity due to excess calories ([place]) 01/27/2016   Gout 06/22/2013   Dyslipidemia 05/08/2011   Severe aortic stenosis 05/08/2011   Breast cancer of upper-outer quadrant of left female breast ([place]) 03/16/2011   PCP:  Ginger Organ., MD Pharmacy:   [facility],  - [address] [place] [address] Phone: [phone] Fax: [phone]  [facility] [place], [place] [address] Phone: [phone] Fax: [phone]  [facility] [address] [place] [address] Phone: [phone] Fax: [phone]  [facility] [id number] [address]: [phone] Fax: [phone] [facility] [name] [place]: [phone]: [phone]    Social Determinants of Health (SDOH) Interventions    Readmission Risk Interventions Readmission Risk Prevention Plan 03/17/2020  Transportation Screening Complete  PCP or Specialist Appt within 3-5 Days Complete  HRI or [place]  Social Work Consult for R[place]anning/Counseling Complete  Palliative Care Screening Not Applicable  Medication Review (Press photographer Complete  Some recent data might be hidden

## 2021-01-04 NOTE — Discharge Instructions (Signed)

## 2021-01-04 NOTE — Progress Notes (Signed)
ANTICOAGULATION CONSULT NOTE - Initial Consult  Pharmacy Consult for Eliquis Indication: atrial fibrillation  Allergies  Allergen Reactions   Oxycodone Other (See Comments)    Hallunication    Patient Measurements: Height: 5\' 2"  (157.5 cm) Weight: 80.3 kg (177 lb) IBW/kg (Calculated) : 50.1  Vital Signs: Temp: 99.1 F (37.3 C) (08/10 0416) Temp Source: Oral (08/10 0416) BP: 135/49 (08/10 0824) Pulse Rate: 61 (08/10 0824)  Labs: Recent Labs    01/02/21 1133 01/03/21 0243 01/03/21 1915 01/04/21 0503  HGB 9.1* 8.7*  --  8.0*  HCT 28.6* 26.4*  --  24.7*  PLT 147* 135*  --  135*  APTT  --   --  30 51*  HEPARINUNFRC  --   --   --  >1.10*  CREATININE 1.67* 1.62*  --  1.94*    Estimated Creatinine Clearance: 22 mL/min (A) (by C-G formula based on SCr of 1.94 mg/dL (H)).   Assessment: Anticoag: hep gtt, held apix PTA for afib. LD 8/8 am. Resume 8/10 - Hgb down to 8.   Goal of Therapy:  Therapeutic oral anticoagulation  Plan:  D/c IV heparin Resume Eliquis 2.5mg  BID Watch rising Scr and declining Hgb  Carrie Monroe S. Alford Highland, PharmD, BCPS Clinical Staff Pharmacist [email or web address] Alford Highland, [employer] 01/04/2021,10:35 AM

## 2021-01-04 NOTE — Progress Notes (Signed)
Progress Note  Patient Name: Carrie Monroe Date of Encounter: 01/04/2021  [facility] Cardiologist: Sanda Klein, MD   Subjective   Sleepy after pain meds. Diuresis about 3 L since admission.  Admission weight from 08/09 likely to be inaccurate. Creatinine has inched up to 1.9. ESR moderately elevated at 50.  CRP markedly elevated at 7.5. No evidence of nutritional deficiency as cause of anemia.  Inpatient Medications    Scheduled Meds:  amiodarone  200 mg Oral Daily   vitamin C  1,000 mg Oral Daily   bupivacaine  10 mL Infiltration Once   cholecalciferol  2,000 Units Oral Daily   feeding supplement  237 mL Oral BID BM   ferrous sulfate  325 mg Oral Q breakfast   furosemide  40 mg Intravenous Daily   Gerhardt's butt cream   Topical BID   methylPREDNISolone acetate  40 mg Intra-articular Once   rosuvastatin  10 mg Oral Daily   vitamin B-12  100 mcg Oral Daily   Continuous Infusions:  heparin 1,400 Units/hr (01/04/21 0645)   PRN Meds: acetaminophen **OR** acetaminophen, dextromethorphan-guaiFENesin, hydrALAZINE, HYDROcodone-acetaminophen, ipratropium-albuterol, metoprolol tartrate, morphine injection, ondansetron **OR** ondansetron (ZOFRAN) IV, polyethylene glycol, senna-docusate   Vital Signs    Vitals:   01/03/21 2300 01/04/21 0416 01/04/21 0648 01/04/21 0824  BP: (!) 153/64 (!) 127/42  (!) 135/49  Pulse: 72 68  61  Resp: 18 20  17   Temp: 98.6 F (37 C) 99.1 F (37.3 C)    TempSrc: Oral Oral    SpO2: 93% 90%  94%  Weight:   80.3 kg   Height:        Intake/Output Summary (Last 24 hours) at 01/04/2021 0911 Last data filed at 01/04/2021 0902 Gross per 24 hour  Intake 883.17 ml  Output 1000 ml  Net -116.83 ml   Last 3 Weights 01/04/2021 01/03/2021 01/03/2021  Weight (lbs) 177 lb 169 lb 8.5 oz 169 lb 8.5 oz  Weight (kg) 80.287 kg 76.9 kg 76.9 kg      Telemetry    Sinus bradycardia- Personally Reviewed  ECG    Sinus bradycardia with left bundle  branch block and very broad QRS at 178 ms, left axis deviation - Personally Reviewed  Physical Exam  Obese, weak GEN: No acute distress.   Neck: No JVD Cardiac: RRR, paradoxically split S2, 2/6 early peaking aortic ejection murmur, no diastolic murmurs, rubs, or gallops.  Respiratory: Clear to auscultation bilaterally. GI: Soft, nontender, non-distended  MS:1-2+ ankle/pedal edema; No deformity. Neuro:  Nonfocal  Psych: Normal affect   Labs    High Sensitivity Troponin:  No results for input(s): TROPONINIHS in the last 720 hours.    Chemistry Recent Labs  Lab 01/02/21 1133 01/03/21 0243 01/04/21 0503  NA 134* 132* 135  K 3.9 3.4* 4.2  CL 101 98 102  CO2 25 26 27   GLUCOSE 115* 100* 169*  BUN 42* 41* 47*  CREATININE 1.67* 1.62* 1.94*  CALCIUM 8.9 8.8* 8.6*  PROT 6.2* 5.7*  --   ALBUMIN 2.2* 2.0*  --   AST 69* 59*  --   ALT 61* 57*  --   ALKPHOS 92 92  --   BILITOT 1.1 0.9  --   GFRNONAA 30* 32* 25*  ANIONGAP 8 8 6      Hematology Recent Labs  Lab 01/02/21 1133 01/03/21 0243 01/03/21 1915 01/04/21 0503  WBC 7.6 6.9  --  7.4  RBC 2.94* 2.80* 2.79* 2.61*  HGB 9.1* 8.7*  --  8.0*  HCT 28.6* 26.4*  --  24.7*  MCV 97.3 94.3  --  94.6  MCH 31.0 31.1  --  30.7  MCHC 31.8 33.0  --  32.4  RDW 14.6 14.5  --  14.7  PLT 147* 135*  --  135*    BNP Recent Labs  Lab 01/02/21 1209  BNP 661.2*     DDimer No results for input(s): DDIMER in the last 168 hours.   Radiology    DG Chest 1 View  Result Date: 01/02/2021 CLINICAL DATA:  Generalized weakness over the last 2 weeks. Left shoulder struck on door frame this morning. EXAM: CHEST  1 VIEW COMPARISON:  03/02/2020 FINDINGS: Mild to moderate enlargement of the cardiopericardial silhouette without evidence of overt edema. Prior TAVR. Atherosclerotic calcification of the aortic arch. Mitral valve calcification. Chronic rim calcified lesion or structure in the left breast as shown on prior exams such 01/10/2017. Suspected  right distal clavicular resection. No discrete airspace opacity is identified. IMPRESSION: 1. Mild to moderate enlargement of the cardiopericardial silhouette, without edema or airspace opacity identified. 2. Prior TAVR.  Mitral valve calcification. 3. Chronic calcified lesion in the left breast, not changed from prior exams. 4.  Aortic Atherosclerosis (ICD10-I70.0). Electronically Signed   By: Van Clines M.D.   On: 01/02/2021 12:38   DG Shoulder Left  Result Date: 01/02/2021 CLINICAL DATA:  Struck left shoulder on door frame this morning. EXAM: LEFT SHOULDER - 2+ VIEW COMPARISON:  Chest radiograph 03/02/2020 FINDINGS: Degenerative glenohumeral spurring. Substantially reduced acromiohumeral distance with subacromial spurring, raising the likelihood of chronic supraspinatus tendon tear. Mild degenerative AC joint spurring. Subtle deformity of the left third rib suspicious for nondisplaced fracture. Atherosclerotic calcification of the aortic arch. IMPRESSION: 1. Subtle deformity of the left third rib suspicious for nondisplaced fracture. 2. Degenerative glenohumeral spurring and degenerative AC joint spurring. 3. Subacromial spur with markedly reduced acromial humeral space favoring chronic supraspinatus tendon tear. 4.  Aortic Atherosclerosis (ICD10-I70.0). Electronically Signed   By: Van Clines M.D.   On: 01/02/2021 12:42   DG Knee Complete 4 Views Right  Result Date: 01/02/2021 CLINICAL DATA:  Weakness.  Right knee pain EXAM: RIGHT KNEE - COMPLETE 4+ VIEW COMPARISON:  Report from 11/02/2002 FINDINGS: SFA and popliteal artery atherosclerotic calcification. Medial compartmental arthroplasty. Lateral meniscal chondrocalcinosis. There is a moderate knee joint effusion. Marginal spurring in the patellofemoral and lateral compartment. Questionable slight medial tibial plateau volume loss along the prosthesis, likely chronic. I do not observe a fracture or an acute bony finding. IMPRESSION: 1.  Moderate knee joint effusion. 2. Chondrocalcinosis with spurring in the lateral compartment and patellofemoral joint. 3. Medial compartmental arthroplasty, equivocal mild volume loss in the medial tibial plateau but below the tibial tray component. Electronically Signed   By: Van Clines M.D.   On: 01/02/2021 12:53    Cardiac Studies   ECHO: 08/17/2020 2D DIMENSIONS  AORTA          Values     Normal RangeMAIN PA      Values     Normal Range       Annulus:  nm*  cm    [1.9 - 2.7]    PA Main:  nm*  cm    [1.5 - 2.1]     Aorta Sin:  nm*  cm    [2.4 - 3.6] RIGHT VENTRICLE   ST Junction:  nm*  cm    [2 - 3.2]      RV Base:  4.3 cm    [2.5 - 4.1]     Asc.Aorta:   3.2 cm    [1.9 - 3.5]     RV Mid:   2.6 cm    [1.9 - 3.5]  LEFT VENTRICLE                         RV Length:  nm*  cm    [  ]         LVIDd:   5.3 cm    [3.7 - 5.3] RIGHT ATRIUM         LVIDs:   4.1 cm    [2.2 - 3.4]    RA Area:  25   cm2   [ <= 20]        LVEDVi:  nm*  ml/m2 [29 - 61]         RAVi:  47   ml/m2 [15 - 27]        LVESVi:  nm*  ml/m2 [8 - 24]    INFERIOR VENA CAVA            FS:  23   %     [ >= 25]       Max.IVC:   1.7 cm    [ <= 2.1]           SWT:   1.3 cm    [0.6 - 0.9]    Min.IVC:  nm*  cm    [ <= 1.7]           PWT:   1.4 cm    [0.6 - 0.9] __________________  LEFT ATRIUM                           nm* - not measured       LA Diam:   4.5 cm    [2.7 - 3.8]       LA Area:  27   cm2   [ <= 20]     LA Volume:  97   ml    [22 - 52]          LAVi:  50   ml/m2 [16 - 34]   ECHOCARDIOGRAPHIC DESCRIPTIONS -----------------------------------------------  AORTIC ROOT          Size: Normal    Dissection: INDETERM FOR DISSECTION   AORTIC VALVE      Leaflets: PROSTHETIC            Morphology: Not seen      Mobility: Not seen   LEFT VENTRICLE                                      Anterior: HYPOCONTRACTILE          Size: Normal                                 Lateral: HYPOCONTRACTILE   Contraction: MOD GLOBAL DECREASE                      Septal: HYPOCONTRACTILE    Closest EF: 40% (Estimated)  Calc.EF: 46% (2D)      Apical: HYPOCONTRACTILE     LV masses: No Masses  Inferior: HYPOCONTRACTILE           LVH: MILD LVH CONCENTRIC                  Posterior: HYPOCONTRACTILE   LV GLS(GE): -12.2% Normal Range [ <= -16]  Dias.FxClass: N/A   MITRAL VALVE      Leaflets: ABNORMAL                Mobility: Fully mobile    Morphology: THICKENED LEAFLET(S)   LEFT ATRIUM          Size: SEVERELY ENLARGED     LA masses: No masses                Normal IAS   MAIN PA          Size: Not seen   PULMONIC VALVE    Morphology: Normal      Mobility: Fully Mobile   RIGHT VENTRICLE          Size: Normal                    Free wall: HYPOCONTRACTILE   Contraction: MILD GLOBAL DECREASE      RV masses: No Masses         TAPSE:   1.8 cm,  Normal Range [>= 1.6 cm]       RV Note: TV S' 0.35ms   TRICUSPID VALVE      Leaflets: Normal                  Mobility: Fully mobile    Morphology: Normal   RIGHT ATRIUM          Size: MODERATELY ENLARGED        RA Other: None     RA masses: No masses   PERICARDIUM         Fluid: No effusion   INFERIOR VENACAVA          Size: Normal     Normal respiratory collapse   DOPPLER ECHO and OTHER SPECIAL PROCEDURES ------------------------------------     Aortic: No AR                  TAVR      2.2 m/s peak vel   19 mmHg peak grad  10 mmHg mean grad 1.2 cm2 by DOPPLER   LVOT Diam: 1.7 cm. Dimensionless Index: 0.55      Mitral: MODERATE MR            No MS     MV Inflow E Vel.= 104.0 cm/s  MV Annulus E'Vel.= 6.0 cm/s  E/E'Ratio= 17   Tricuspid: MILD TR                No TS             3.4 m/s peak TR vel   50 mmHg peak RV pressure   Pulmonary: MODERATE PR            No PS     CARDIAC MONITOR 08/12/2020 Impression   1. Predominantly sinus mechanism rhythms and 4 occurrences of supraventricular tachycardia, the longest lasting 12 beats. (0.17% PAC burden)  2. No ventricular ectopy noted 3. Patient triggered events notable for artifact/lead loss preceded by sinus  bradycardia 4. Automatically detected events include supraventricular ectopy(PACs) and SVT   Narrative   This result has an attachment that is not available.  *The observed rhythms are sinus bradycardia to sinus rhythm. *The Maximum Heart  Rate recorded was 120 bpm, Day 3 / 12:54:10 pm, the Minimum Heart Rate recorded was 35 bpm, Day 6 / 12:05:18 pm and the Average Heart Rate was 54 bpm. *There were 910 PSVCs  with a burden of 0.17 %. There were 4 occurrences of Supraventricular Tachycardia with the longest episode 12 beats, Day 2 / 11:33:15 am and the fastest episode 120 bpm, Day 3 / 12:54:14 pm. *There was1 Patient triggeredevents.   CARDIAC CATH/PCI, 12/28/2016 A STENT SYNERGY DES 3X16 drug eluting stent was successfully placed. Mid Cx lesion, 99 %stenosed. Post intervention, there is a 0% residual stenosis. A STENT SYNERGY DES 2.5X28 drug eluting stent was successfully placed. Mid LAD lesion, 90 %stenosed. Post intervention, there is a 0% residual stenosis. Ost 2nd Diag lesion, 70 %stenosed. Post intervention, there is a 60% residual stenosis.   1. Severe stenosis mid LAD at bifurcation of the Diagonal. Successful PTCA/DES x 1 mid LAD. Successful PTCA with balloon angioplasty only ostium Diagonal. 2. Severe stenosis mid Circumflex. Successful PTCA/DES x 1 mid to distal Circumflex.   Recommendations. Will continue ASA and Plavix for at least 3 months. Follow renal function and H/H in am. I will plan medical management of the moderate stenosis in the mid RCA. I will discuss f/u with Dr. Roxy Manns for second surgical consult. She will need staged CT scans in preparation for TAVR based on renal function. She will have TAVR then will have carotid endarterectomy when all cardiac issues are stable in several months. Diagnostic Dominance: Right      Intervention         Patient Profile      85 y.o. female with a hx of CAD s/p DES mCFX/mLAD w/ PTCA Diag and AS s/p TAVR 2018, persistent Afib (w/ intol BB/CCB 2nd hypotension/dizziness, now on amiodarone and Eliquis), s/p R TCAR, HFrCHF III w/ EF 40-45% by echo 07/2020, CKD III, DM, HTN, HLD, LBBB, anemia presents with volume overload, extreme weakness and immobility, multiple joint complaints  Assessment & Plan    CHF exacerbation: Chronic NYHA class III symptoms, now with almost complete immobility.  She has been sleeping in a recliner for years, which makes it hard to discern whether she has had orthopnea.  She skipped multiple doses of diuretics since she could not get up to the bathroom.  Most recent LVEF 40-45% with predominantly global hypokinesis pattern.  Medical therapy limited by dizziness/bradycardia/hypotension.  Has an extremely broad LBBB and may benefit from CRT-P.  She has lost a lot of true weight, "dry weight" needs to be reestablished, appears to be close to that today. Conduction system disease/drug-induced bradycardia: She has severe persistent sinus bradycardia due to amiodarone therapy and advanced age related conduction system disease.  The recent monitor showed that her minimum heart rate in sinus bradycardia was 35 bpm and her average heart rate was 54 bpm.  Some of her fatigue can readily be explained by chronotropic incompetence.  She would benefit from pacemaker therapy, specifically CRT-P with rate response.  This was proposed by her cardiologist at [facility], but she has been reluctant to pursue any further invasive procedures. CAD: Currently asymptomatic.  4 years have passed since her revascularization procedure, which was prompted by the need for TAVR, rather than symptomatic CAD.  Cardiomyopathy is felt to be primarily nonischemic. Afib: Had persistent atrial fibrillation rapid ventricular response with difficulty in rate control due to hypotension with conventional AV blocking agents.  Developed tachycardia  related cardiomyopathy with EF down to 20%, recovered  to 40-45% after 3 months in sinus rhythm.  On anticoagulation with Eliquis.  No overt bleeding. Amiodarone: Started after she failed cardioversion and has helped maintain sinus rhythm for over 6 months now. Acute on CKD 3b: Creatinine has begun to increase with diuresis.  Residual edema is not surprising with severe hypoalbuminemia.  Switch to p.o. diuretics. Anemia: Hyporegenerative, normocytic normochromic.  Contributing to fatigue and weakness.  No evidence of iron deficiency or other nutritional cause.  Could be related to CKD +/-anemia of chronic disease. Elevated ESR/CRP: Nonspecific and not really as high as expected for polymyalgia rheumatica, especially in the setting of anemia and marked hypoalbuminemia which make the ESR higher.  Has a knee joint effusion with plan for steroid injection.  Shoulder symptoms are probably related to a chronic rotator cuff tear. Severe  Deconditioning: Prognosis is not good.  May not be the best candidate for invasive/aggressive therapy and palliative care may need to be considered.  I do not think her family is ready for that decision at this time though.     For questions or updates, please contact [place] Please consult [email or web address] for contact info under        Signed, Sanda Klein, MD  01/04/2021, 9:11 AM

## 2021-01-04 NOTE — Progress Notes (Signed)
ANTICOAGULATION CONSULT NOTE - Follow Up Consult  Pharmacy Consult for heparin Indication: atrial fibrillation  Labs: Recent Labs    01/02/21 1133 01/03/21 0243 01/03/21 1915 01/04/21 0503  HGB 9.1* 8.7*  --  8.0*  HCT 28.6* 26.4*  --  24.7*  PLT 147* 135*  --  135*  APTT  --   --  30 51*  HEPARINUNFRC  --   --   --  >1.10*  CREATININE 1.67* 1.62*  --   --     Assessment: 84yo female subtherapeutic on heparin after rate change; no infusion issues or signs of bleeding per RN.  Goal of Therapy:  aPTT 66-102 seconds   Plan:  Will increase heparin infusion by 2 units/kg/hr to 1400 units/hr and check PTT in 8 hours.    Wynona Neat, PharmD, BCPS  01/04/2021,6:29 AM

## 2021-01-04 NOTE — Plan of Care (Signed)
  Problem: Pain Managment: Goal: General experience of comfort will improve Outcome: Progressing   Problem: Safety: Goal: Ability to remain free from injury will improve Outcome: Progressing   

## 2021-01-04 NOTE — Progress Notes (Signed)
Synovial fluids studies shows calcium pyrophosphate crystal with elevated WBC and Neutrophils. Gram stain and culture pending, will empirically start Vanc/Rocephine.  Will consult ID as well.   Gretta Samons

## 2021-01-04 NOTE — Progress Notes (Signed)
PROGRESS NOTE    Carrie Monroe  [id number]:[id number] DOB: 1935/08/21 DOA: 01/02/2021 PCP: Ginger Organ., MD   Brief Narrative:  85 year old with history of severe aortic stenosis status post TAVR, CAD status post PCI, CKD, CHF, left breast cancer, carotid artery stenosis status post CEA, DM2 diet controlled, renal stones, HLD, HTN, squamous cell carcinoma of scalp presented to ED with severe fatigue and weakness for several weeks.  No longer able to participate in her ADL S.  She was seen by [facility] cardiology on 7/27 with plan for biventricular pacemaker placement.  Cardiology team has been consulted who recommended diuresis and ongoing discussion regarding biventricular pacemaker placement.  During hospitalization also had right shoulder and knee pain noted to have moderate effusion therefore orthopedic consulted for arthrocentesis.   Assessment & Plan:   Active Problems:   Volume overload   Acute congestive heart failure with reduced ejection fraction, 45%, class III Severe fatigue and generalized weakness - Seen by cardiology.  Fluid restriction, input and output.  Has advanced cardiac disease.  Diuretic therapy guided by cardiology team.  History of severe aortic stenosis status post TAVR- 2018 -Supportive care.  Cardiology following  History of CAD status post PCI B/l Carotid Atery Disease s/p resvasc 2020 -Currently chest pain-free.  On Eliquis and statin  Diabetes mellitus type 2 - Diet controlled.  Sliding scale and Accu-Cheks  Essential hypertension -Not on medications due to low BPs  Atrial fibrillation, permanent - On home amiodarone.  We will discontinue heparin drip today and resume her Eliquis..  In the meantime we will start her on heparin drip. Need to get PFTs and DLCO at somepoint outpatient   Hyperlipidemia - Crestor  CKD CKD Stage 3a -Baseline creatinine 1.6.  Closely continue to monitor.  Creatinine 1.94.  Anemia of chronic disease - Baseline  hemoglobin 9.0, drifted down over several months - Admission hemoglobin 8.7.  No obvious signs of bleeding, continue iron supplements with bowel regimen.  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  Lesion appears to be deeper than initially expected there is ongoing discussion with Dr. Raynelle Chary at [facility] for systemic chemo with radiation treatment  Shoulder and Knee Pain. Moderate Knee effusion Rib fracture -Orthopedic planning on arthrocentesis today, will send studies.  Resume Eliquis.  Hopefully this will help with her mobility  DVT prophylaxis: Eliquis Code Status: Full code Family Communication: Spoke with [place] and [name].   Status is: Inpatient  Remains inpatient appropriate because:Inpatient level of care appropriate due to severity of illness  Dispo: The patient is from: Home              Anticipated d/c is to: Home              Patient currently is not medically stable to d/c. On going cardiac management per cardiology.    Difficult to place patient No       Nutritional status           Body mass index is 32.37 kg/m.  Pressure Injury 03/03/20 Sacrum Mid Stage 2 -  Partial thickness loss of dermis presenting as a shallow open injury with a red, pink wound bed without slough. (Active)  03/03/20 0226  Location: Sacrum  Location Orientation: Mid  Staging: Stage 2 -  Partial thickness loss of dermis presenting as a shallow open injury with a red, pink wound bed without slough.  Wound Description (Comments):  Present on Admission: Yes     Pressure Injury 01/03/21 Thigh Posterior;Right Stage 2 -  Partial thickness loss of dermis presenting as a shallow open injury with a red, pink wound bed without slough. (Active)  01/03/21 0048  Location: Thigh  Location Orientation: Posterior;Right  Staging: Stage 2 -  Partial thickness loss of dermis presenting as a shallow open injury  with a red, pink wound bed without slough.  Wound Description (Comments):   Present on Admission: Yes     Pressure Injury 01/03/21 Coccyx Medial Unstageable - Full thickness tissue loss in which the base of the injury is covered by slough (yellow, tan, gray, green or brown) and/or eschar (tan, brown or black) in the wound bed. (Active)  01/03/21 0050  Location: Coccyx  Location Orientation: Medial  Staging: Unstageable - Full thickness tissue loss in which the base of the injury is covered by slough (yellow, tan, gray, green or brown) and/or eschar (tan, brown or black) in the wound bed.  Wound Description (Comments):   Present on Admission: Yes          Subjective: Breathing is better but still having bilateral knee issues especially on the right side.  Having difficulty getting up and mobilizing herself.    Examination:  Constitutional: Not in acute distress, elderly frail Respiratory: Clear to auscultation bilaterally Cardiovascular: Normal sinus rhythm, no rubs Abdomen: Nontender nondistended good bowel sounds Musculoskeletal: Limited flexion of her bilateral knees Skin: No rashes seen Neurologic: CN 2-12 grossly intact.  And nonfocal Psychiatric: Normal judgment and insight. Alert and oriented x 3. Normal mood. Objective: Vitals:   01/03/21 2300 01/04/21 0416 01/04/21 0648 01/04/21 0824  BP: (!) 153/64 (!) 127/42  (!) 135/49  Pulse: 72 68  61  Resp: _0 Temp: 98.6 F (37 C) 99.1 F (37.3 C)    TempSrc: Oral Oral    SpO2: 93% 90%  94%  Weight:   80.3 kg   Height:        Intake/Output Summary (Last 24 hours) at 01/04/2021 0945 Last data filed at 01/04/2021 0902 Gross per 24 hour  Intake 883.17 ml  Output 1000 ml  Net -116.83 ml   Filed Weights   01/03/21 0018 01/03/21 0500 01/04/21 0648  Weight: 76.9 kg 76.9 kg 80.3 kg     Data Reviewed:   CBC: Recent Labs  Lab 01/02/21 1133 01/03/21 0243 01/04/21 0503  WBC 7.6 6.9 7.4  NEUTROABS 6.3  --    --   HGB 9.1* 8.7* 8.0*  HCT 28.6* 26.4* 24.7*  MCV 97.3 94.3 94.6  PLT 147* 135* 220*   Basic Metabolic Panel: Recent Labs  Lab 01/02/21 1133 01/03/21 0243 01/04/21 0503  NA 134* 132* 135  K 3.9 3.4* 4.2  CL 101 98 102  CO2 _1 GLUCOSE 115* 100* 169*  BUN 42* 41* 47*  CREATININE 1.67* 1.62* 1.94*  CALCIUM 8.9 8.8* 8.6*  MG  --   --  2.0   GFR: Estimated Creatinine Clearance: 22 mL/min (A) (by C-G formula based on SCr of 1.94 mg/dL (H)). Liver Function Tests: Recent Labs  Lab 01/02/21 1133 01/03/21 0243  AST 69* 59*  ALT 61* 57*  ALKPHOS 92 92  BILITOT 1.1 0.9  PROT 6.2* 5.7*  ALBUMIN 2.2* 2.0*   No results for input(s): LIPASE, AMYLASE in the last 168 hours. No results for input(s): AMMONIA in the last 168 hours. Coagulation Profile: No results for input(s): INR, PROTIME in  the last 168 hours. Cardiac Enzymes: No results for input(s): CKTOTAL, CKMB, CKMBINDEX, TROPONINI in the last 168 hours. BNP (last 3 results) No results for input(s): PROBNP in the last 8760 hours. HbA1C: No results for input(s): HGBA1C in the last 72 hours. CBG: Recent Labs  Lab 01/03/21 0022  GLUCAP 108*   Lipid Profile: No results for input(s): CHOL, HDL, LDLCALC, TRIG, CHOLHDL, LDLDIRECT in the last 72 hours. Thyroid Function Tests: Recent Labs    01/04/21 0503  TSH 1.558   Anemia Panel: Recent Labs    01/03/21 1915  VITAMINB12 4,788*  FOLATE 23.0  FERRITIN 280  TIBC 199*  IRON 29  RETICCTPCT 2.3   Sepsis Labs: No results for input(s): PROCALCITON, LATICACIDVEN in the last 168 hours.  Recent Results (from the past 240 hour(s))  Resp Panel by RT-PCR (Flu A&B, Covid) Nasopharyngeal Swab     Status: None   Collection Time: 01/02/21  2:07 PM   Specimen: Nasopharyngeal Swab; Nasopharyngeal(NP) swabs in vial transport medium  Result Value Ref Range Status   SARS Coronavirus 2 by RT PCR NEGATIVE NEGATIVE Final    Comment: (NOTE) SARS-CoV-2 target nucleic acids  are NOT DETECTED.  The SARS-CoV-2 RNA is generally detectable in upper respiratory specimens during the acute phase of infection. The lowest concentration of SARS-CoV-2 viral copies this assay can detect is 138 copies/mL. A negative result does not preclude SARS-Cov-2 infection and should not be used as the sole basis for treatment or other patient management decisions. A negative result may occur with  improper specimen collection/handling, submission of specimen other than nasopharyngeal swab, presence of viral mutation(s) within the areas targeted by this assay, and inadequate number of viral copies(<138 copies/mL). A negative result must be combined with clinical observations, patient history, and epidemiological information. The expected result is Negative.  Fact Sheet for Patients:  EntrepreneurPulse.com.au  Fact Sheet for Healthcare Providers:  IncredibleEmployment.be  This test is no t yet approved or cleared by the [place] FDA and  has been authorized for detection and/or diagnosis of SARS-CoV-2 by FDA under an Emergency Use Authorization (EUA). This EUA will remain  in effect (meaning this test can be used) for the duration of the COVID-19 declaration under Section 564(b)(1) of the Act, 21 U.S.C.section 360bbb-3(b)(1), unless the authorization is terminated  or revoked sooner.       Influenza A by PCR NEGATIVE NEGATIVE Final   Influenza B by PCR NEGATIVE NEGATIVE Final    Comment: (NOTE) The Xpert Xpress SARS-CoV-2/FLU/RSV plus assay is intended as an aid in the diagnosis of influenza from Nasopharyngeal swab specimens and should not be used as a sole basis for treatment. Nasal washings and aspirates are unacceptable for Xpert Xpress SARS-CoV-2/FLU/RSV testing.  Fact Sheet for Patients: EntrepreneurPulse.com.au  Fact Sheet for Healthcare Providers: IncredibleEmployment.be  This test is  not yet approved or cleared by the [place] FDA and has been authorized for detection and/or diagnosis of SARS-CoV-2 by FDA under an Emergency Use Authorization (EUA). This EUA will remain in effect (meaning this test can be used) for the duration of the COVID-19 declaration under Section 564(b)(1) of the Act, 21 U.S.C. section 360bbb-3(b)(1), unless the authorization is terminated or revoked.  Performed at [facility], [place] [address], [place], [place] [address]          Radiology Studies: DG Chest 1 View  Result Date: 01/02/2021 CLINICAL DATA:  Generalized weakness over the last 2 weeks. Left shoulder struck on door frame this  morning. EXAM: CHEST  1 VIEW COMPARISON:  03/02/2020 FINDINGS: Mild to moderate enlargement of the cardiopericardial silhouette without evidence of overt edema. Prior TAVR. Atherosclerotic calcification of the aortic arch. Mitral valve calcification. Chronic rim calcified lesion or structure in the left breast as shown on prior exams such 01/10/2017. Suspected right distal clavicular resection. No discrete airspace opacity is identified. IMPRESSION: 1. Mild to moderate enlargement of the cardiopericardial silhouette, without edema or airspace opacity identified. 2. Prior TAVR.  Mitral valve calcification. 3. Chronic calcified lesion in the left breast, not changed from prior exams. 4.  Aortic Atherosclerosis (ICD10-I70.0). Electronically Signed   By: Van Clines M.D.   On: 01/02/2021 12:38   DG Shoulder Left  Result Date: 01/02/2021 CLINICAL DATA:  Struck left shoulder on door frame this morning. EXAM: LEFT SHOULDER - 2+ VIEW COMPARISON:  Chest radiograph 03/02/2020 FINDINGS: Degenerative glenohumeral spurring. Substantially reduced acromiohumeral distance with subacromial spurring, raising the likelihood of chronic supraspinatus tendon tear. Mild degenerative AC joint spurring. Subtle deformity of the left third rib suspicious for nondisplaced  fracture. Atherosclerotic calcification of the aortic arch. IMPRESSION: 1. Subtle deformity of the left third rib suspicious for nondisplaced fracture. 2. Degenerative glenohumeral spurring and degenerative AC joint spurring. 3. Subacromial spur with markedly reduced acromial humeral space favoring chronic supraspinatus tendon tear. 4.  Aortic Atherosclerosis (ICD10-I70.0). Electronically Signed   By: Van Clines M.D.   On: 01/02/2021 12:42   DG Knee Complete 4 Views Right  Result Date: 01/02/2021 CLINICAL DATA:  Weakness.  Right knee pain EXAM: RIGHT KNEE - COMPLETE 4+ VIEW COMPARISON:  Report from 11/02/2002 FINDINGS: SFA and popliteal artery atherosclerotic calcification. Medial compartmental arthroplasty. Lateral meniscal chondrocalcinosis. There is a moderate knee joint effusion. Marginal spurring in the patellofemoral and lateral compartment. Questionable slight medial tibial plateau volume loss along the prosthesis, likely chronic. I do not observe a fracture or an acute bony finding. IMPRESSION: 1. Moderate knee joint effusion. 2. Chondrocalcinosis with spurring in the lateral compartment and patellofemoral joint. 3. Medial compartmental arthroplasty, equivocal mild volume loss in the medial tibial plateau but below the tibial tray component. Electronically Signed   By: Van Clines M.D.   On: 01/02/2021 12:53        Scheduled Meds:  amiodarone  200 mg Oral Daily   vitamin C  1,000 mg Oral Daily   bupivacaine  10 mL Infiltration Once   cholecalciferol  2,000 Units Oral Daily   feeding supplement  237 mL Oral BID BM   ferrous sulfate  325 mg Oral Q breakfast   furosemide  40 mg Intravenous Daily   Gerhardt's butt cream   Topical BID   methylPREDNISolone acetate  40 mg Intra-articular Once   rosuvastatin  10 mg Oral Daily   vitamin B-12  100 mcg Oral Daily   Continuous Infusions:  heparin 1,400 Units/hr (01/04/21 0645)     LOS: 2 days   Time spent= 35 mins      Arsenio Loader, MD [facility]  If 7PM-7AM, please contact night-coverage  01/04/2021, 9:45 AM

## 2021-01-04 NOTE — Procedures (Signed)
Procedure: Right knee aspiration and injection   Indication: Right knee effusion(s)   Surgeon: Silvestre Gunner, PA-C   Assist: None   Anesthesia: Topical refrigerant   EBL: None   Complications: None   Findings: After risks/benefits explained patient desires to undergo procedure. Consent obtained and time out performed. The right knee was sterilely prepped and aspirated. 1ml clear orange fluid obtained. 17ml 0.5% Marcaine instilled. Pt tolerated the procedure well.       Lisette Abu, PA-C Orthopedic Surgery [phone]

## 2021-01-05 DIAGNOSIS — I5043 Acute on chronic combined systolic (congestive) and diastolic (congestive) heart failure: Secondary | ICD-10-CM | POA: Diagnosis not present

## 2021-01-05 DIAGNOSIS — Z952 Presence of prosthetic heart valve: Secondary | ICD-10-CM | POA: Diagnosis not present

## 2021-01-05 DIAGNOSIS — I495 Sick sinus syndrome: Secondary | ICD-10-CM | POA: Diagnosis not present

## 2021-01-05 DIAGNOSIS — I480 Paroxysmal atrial fibrillation: Secondary | ICD-10-CM | POA: Diagnosis not present

## 2021-01-05 LAB — MAGNESIUM: Magnesium: 2.1 mg/dL (ref 1.7–2.4)

## 2021-01-05 LAB — BASIC METABOLIC PANEL
Anion gap: 8 (ref 5–15)
BUN: 49 mg/dL — ABNORMAL HIGH (ref 8–23)
CO2: 27 mmol/L (ref 22–32)
Calcium: 9 mg/dL (ref 8.9–10.3)
Chloride: 101 mmol/L (ref 98–111)
Creatinine, Ser: 2.08 mg/dL — ABNORMAL HIGH (ref 0.44–1.00)
GFR, Estimated: 23 mL/min — ABNORMAL LOW (ref >60)
Glucose, Bld: 152 mg/dL — ABNORMAL HIGH (ref 70–99)
Potassium: 4.2 mmol/L (ref 3.5–5.1)
Sodium: 136 mmol/L (ref 135–145)

## 2021-01-05 LAB — T3, FREE: T3, Free: 1.3 pg/mL — ABNORMAL LOW (ref 2.0–4.4)

## 2021-01-05 MED ORDER — TRAMADOL HCL 50 MG PO TABS
50.0000 mg | ORAL_TABLET | Freq: Four times a day (QID) | ORAL | Status: DC | PRN
Start: 2021-01-05 — End: 2021-01-08
  Administered 2021-01-05 – 2021-01-08 (×2): 50 mg via ORAL
  Filled 2021-01-05 (×2): qty 1

## 2021-01-05 MED ORDER — ADULT MULTIVITAMIN W/MINERALS CH
1.0000 | ORAL_TABLET | Freq: Every day | ORAL | Status: DC
  Administered 2021-01-05 – 2021-01-09 (×3): 1 via ORAL
  Filled 2021-01-05 (×4): qty 1

## 2021-01-05 MED ORDER — COLCHICINE 0.3 MG HALF TABLET
0.3000 mg | ORAL_TABLET | Freq: Every day | ORAL | Status: DC
  Administered 2021-01-05 – 2021-01-09 (×5): 0.3 mg via ORAL
  Filled 2021-01-05 (×5): qty 1

## 2021-01-05 MED ORDER — HYDROMORPHONE HCL 1 MG/ML IJ SOLN
1.0000 mg | INTRAMUSCULAR | Status: DC | PRN
  Filled 2021-01-05: qty 1

## 2021-01-05 NOTE — Progress Notes (Signed)
After wound care patient report severe pain offered tylenol but refused patient verbalized "it do nothing"  MD notified and changed PRN pain meds offer both (dilaudid & tramadol) to the pt. but still refused.   Patient's son Mikki Santee and patient's friend Santiago Glad refused to used of air matters. Charged nurse notified and talk to the patient son's and friend explain used of air bed verbalized understand and still would like to change the air bed to a regular hospital bed.

## 2021-01-05 NOTE — Progress Notes (Signed)
PROGRESS NOTE    Carrie Monroe  PNT:[id number] DOB: Sep 27, 1935 DOA: 01/02/2021 PCP: Ginger Organ., MD    Brief Narrative:  Carrie Monroe was admitted to the hospital with working diagnosis of acute congestive heart failure exacerbation.  85 year old female past medical history for severe arctic stenosis status post TAVR, coronary artery disease, chronic kidney disease, heart failure, breast cancer, carotid artery stenosis status post TCAR, type 2 diabetes mellitus, dyslipidemia and hypertension who presented with severe fatigue and generalized weakness.  Reported several weeks of worsening symptoms, requiring assistance for activities of daily living.  Because of persistent symptoms she was brought to the hospital.  On her initial physical examination blood pressure 154/54, heart rate 54, respirate 19, oxygen saturation 98%, she was ill-looking appearing, her lungs were clear to auscultation bilaterally, heart S1-S2, present, rhythmic, soft abdomen, positive 3+ extremity edema up to the knees.  Patient was placed on aggressive diuresis for volume overload.  For severe knee pain she underwent arthrocentesis.  Assessment & Plan:   Active Problems:   Acute on chronic combined systolic and diastolic CHF (congestive heart failure) (HCC)   Volume overload   Paroxysmal atrial fibrillation (HCC)   Tachycardia-bradycardia syndrome (HCC)   LBBB (left bundle branch block)  Acute on chronic diastolic heart failure decompensation. Severe aortic stenosis. (TAVR 2018)Hypervolemia has been improving, urine output over last 24 hrs is 1,300 ml. Oxygenation is 95% on room air.  Currently holding on diuresis.  2. CAD patient is chest pain free, continue with asa and statin therapy  3. T2DM. Dyslipidemia Continue insulin sliding scale for glucose cover and monitoring  Continue with rosuvastatin   4. AKI on CKD stage 3a renal function with serum cr at 2,0 with K at 4,2 and serum bicarbonate at  27. Continue close follow up on renal function and electrolytes Holding on diuresis for now   5. 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 Plan to follow up with [facility] as outpatient   6. Right knee pain, (pseudogout).  Sp right knee aspiration 14 ml of orange fluid obtained.  40 mg methylprednisolone injection  Fluid positive for calcium pyrophosphate crystals, only 4,935 wbc 93% PMN. Gram stain no organism and culture with no growth.  No signs of septic arthritis, will discontinue antibiotic therapy and continue close monitoring.  Continue pain control with tramadol    7. Pressure ulcer thigh stage 2 and coccyx not able to stage. Present on admission, continue with local skin care.   8. Iron deficiency anemia. Continue with oral iron supplementation   9. Chronic atrial fibrillation. Continue with amiodarone and anticoagulation with apixaban   Status is: Inpatient  Remains inpatient appropriate because:Inpatient level of care appropriate due to severity of illness  Dispo: The patient is from: Home              Anticipated d/c is to: SNF              Patient currently is not medically stable to d/c.   Difficult to place patient No   DVT prophylaxis: Apixaban   Code Status:    full  Family Communication:   I spoke with patient's son at the bedside, we talked in detail about patient's condition, plan of care and prognosis and all questions were addressed.      Nutrition Status:           Skin Documentation: Pressure Injury 03/03/20 Sacrum Mid Stage 2 -  Partial thickness loss of dermis presenting  as a shallow open injury with a red, pink wound bed without slough. (Active)  03/03/20 0226  Location: Sacrum  Location Orientation: Mid  Staging: Stage 2 -  Partial thickness loss of dermis presenting as a shallow open injury with a red, pink wound bed without slough.  Wound Description (Comments):   Present on Admission: Yes     Pressure Injury 01/03/21 Thigh  Posterior;Right Stage 2 -  Partial thickness loss of dermis presenting as a shallow open injury with a red, pink wound bed without slough. (Active)  01/03/21 0048  Location: Thigh  Location Orientation: Posterior;Right  Staging: Stage 2 -  Partial thickness loss of dermis presenting as a shallow open injury with a red, pink wound bed without slough.  Wound Description (Comments):   Present on Admission: Yes     Pressure Injury 01/03/21 Coccyx Medial Unstageable - Full thickness tissue loss in which the base of the injury is covered by slough (yellow, tan, gray, green or brown) and/or eschar (tan, brown or black) in the wound bed. (Active)  01/03/21 0050  Location: Coccyx  Location Orientation: Medial  Staging: Unstageable - Full thickness tissue loss in which the base of the injury is covered by slough (yellow, tan, gray, green or brown) and/or eschar (tan, brown or black) in the wound bed.  Wound Description (Comments):   Present on Admission: Yes     Consultants:  Cardiology  Orthopedics   Procedures:  Right knee arthrocentesis    Subjective: Patient feeling better, right knee pain has improved, no nausea or vomiting ,no dyspnea or chest pain, continue to be very weak and deconditioned   Objective: Vitals:   01/05/21 0000 01/05/21 0416 01/05/21 0930 01/05/21 1137  BP:  (!) 146/52 (!) 127/45 (!) 140/46  Pulse:  61 (!) 58 (!) 56  Resp:  19  17  Temp:  99.4 F (37.4 C) 98.3 F (36.8 C) 97.9 F (36.6 C)  TempSrc:  Oral Oral Oral  SpO2:  93% 94% 95%  Weight: 82.1 kg     Height:        Intake/Output Summary (Last 24 hours) at 01/05/2021 1508 Last data filed at 01/05/2021 0900 Monroe per 24 hour  Intake 300.07 ml  Output 700 ml  Net -399.93 ml   Filed Weights   01/03/21 0500 01/04/21 0648 01/05/21 0000  Weight: 76.9 kg 80.3 kg 82.1 kg    Examination:   General: Not in pain or dyspnea  Neurology: Awake and alert, non focal  E ENT: mild pallor, no icterus, oral  mucosa moist Cardiovascular: No JVD. S1-S2 present, rhythmic, no gallops, rubs, or murmurs. Non pitting lower extremity edema. Pulmonary: positive breath sounds bilaterally, adequate air movement, no wheezing, rhonchi or rales. Gastrointestinal. Abdomen soft and no tender Skin. Bilateral boots in place, right knee with no erythema, tenderness or increased local temperature  Musculoskeletal: bilateral knee hypertrophy      Data Reviewed: I have personally reviewed following labs and imaging studies  CBC: Recent Labs  Lab 01/02/21 1133 01/03/21 0243 01/04/21 0503  WBC 7.6 6.9 7.4  NEUTROABS 6.3  --   --   HGB 9.1* 8.7* 8.0*  HCT 28.6* 26.4* 24.7*  MCV 97.3 94.3 94.6  PLT 147* 135* 536*   Basic Metabolic Panel: Recent Labs  Lab 01/02/21 1133 01/03/21 0243 01/04/21 0503 01/05/21 0419  NA 134* 132* 135 136  K 3.9 3.4* 4.2 4.2  CL 101 98 102 101  CO2 25 26 27 27   GLUCOSE  115* 100* 169* 152*  BUN 42* 41* 47* 49*  CREATININE 1.67* 1.62* 1.94* 2.08*  CALCIUM 8.9 8.8* 8.6* 9.0  MG  --   --  2.0 2.1   GFR: Estimated Creatinine Clearance: 20.7 mL/min (A) (by C-G formula based on SCr of 2.08 mg/dL (H)). Liver Function Tests: Recent Labs  Lab 01/02/21 1133 01/03/21 0243  AST 69* 59*  ALT 61* 57*  ALKPHOS 92 92  BILITOT 1.1 0.9  PROT 6.2* 5.7*  ALBUMIN 2.2* 2.0*   No results for input(s): LIPASE, AMYLASE in the last 168 hours. No results for input(s): AMMONIA in the last 168 hours. Coagulation Profile: No results for input(s): INR, PROTIME in the last 168 hours. Cardiac Enzymes: No results for input(s): CKTOTAL, CKMB, CKMBINDEX, TROPONINI in the last 168 hours. BNP (last 3 results) No results for input(s): PROBNP in the last 8760 hours. HbA1C: No results for input(s): HGBA1C in the last 72 hours. CBG: Recent Labs  Lab 01/03/21 0022  GLUCAP 108*   Lipid Profile: No results for input(s): CHOL, HDL, LDLCALC, TRIG, CHOLHDL, LDLDIRECT in the last 72  hours. Thyroid Function Tests: Recent Labs    01/04/21 0503  TSH 1.558  T3FREE 1.3*   Anemia Panel: Recent Labs    01/03/21 1915  VITAMINB12 4,788*  FOLATE 23.0  FERRITIN 280  TIBC 199*  IRON 29  RETICCTPCT 2.3      Radiology Studies: I have reviewed all of the imaging during this hospital visit personally     Scheduled Meds:  amiodarone  200 mg Oral Daily   apixaban  2.5 mg Oral BID   vitamin C  1,000 mg Oral Daily   bupivacaine  10 mL Infiltration Once   cholecalciferol  2,000 Units Oral Daily   colchicine  0.3 mg Oral Daily   feeding supplement  237 mL Oral BID BM   ferrous sulfate  325 mg Oral Q breakfast   Gerhardt's butt cream   Topical BID   methylPREDNISolone acetate  40 mg Intra-articular Once   rosuvastatin  10 mg Oral Daily   vitamin B-12  100 mcg Oral Daily   Continuous Infusions:  cefTRIAXone (ROCEPHIN)  IV Stopped (01/04/21 1708)   [START ON 01/06/2021] vancomycin       LOS: 3 days        Carrie Alderman Gerome Apley, MD

## 2021-01-05 NOTE — Progress Notes (Signed)
Physical Therapy Treatment Patient Details Name: Carrie Monroe MRN: [id number] DOB: 05-30-1935 Today's Date: 01/05/2021    History of Present Illness Pt is an 85 y.o. female admitted 01/02/21 with weakness and progressive immobility. Workup for CHF exacerbation. Pt also with c/o L shoulder and R knee pain; suspicion for L rib fx, AC and GH joint spurring, chronic supraspinatus tendon tear; R knee effusion. S/p R knee aspiration 8/10. Pt with wound to L buttock. PMH includes CAD, AS s/p TAVR. HF, HTN, DM2, afib, CKD, obesity.   PT Comments    Pt seen for additional session for standing trials from recliner and return to bed. Pt required heavy maxA+2 to stand from lower surface height with Stedy frame; maxA+2 to [place] for bed mobility and repositioning to encourage pressure relief to sacrum and other bony prominences. Pt limited by significant pain, fatigue, generalized weakness and decreased activity tolerance. Recommend use of maximove lift for OOB with nursing staff. Will continue to follow acutely to address established goals.   Follow Up Recommendations  SNF;Supervision for mobility/OOB     Equipment Recommendations  Wheelchair (measurements PT);Wheelchair cushion (measurements PT);3in1 (PT);Hospital bed (hoyer lift)    Recommendations for Other Services       Precautions / Restrictions Precautions Precautions: Fall;Other (comment) Precaution Comments: painful sore on buttocks Restrictions Weight Bearing Restrictions: No    Mobility  Bed Mobility Overal bed mobility: Needs Assistance Bed Mobility: Sit to Supine;Rolling Rolling: Max assist   Supine to sit: Max assist;+2 for physical assistance;HOB elevated Sit to supine: Max assist;+2 for physical assistance;Total assist   General bed mobility comments: MaxA+2 to totalA for return to supine via helicopter technique; pt limited by pain and fatigue, requiring max-totalA for trunk and BLE management; totalA for scooting up  in bed; maxA to roll R/L for pressure relief pad positioning; pt left with partial roll onto L-side for pressure relief due to sacral discomfort    Transfers Overall transfer level: Needs assistance Equipment used: Ambulation equipment used Transfers: Sit to/from Stand Sit to Stand: Max assist;+2 physical assistance         General transfer comment: MaxA+2 to stand from low recliner height into stedy standing frame; pt requires max verbal/tactile cues for sequencing and to extend hips/trunk/neck, pt able to achieve near fully upright with heavy external assist; additional stand from stedy frame with maxA+2; very poor eccentric control into sitting  Ambulation/Gait             General Gait Details: unable   Stairs             Wheelchair Mobility    Modified Rankin (Stroke Patients Only)       Balance Overall balance assessment: Needs assistance Sitting-balance support: Bilateral upper extremity supported;No upper extremity supported Sitting balance-Leahy Scale: Poor Sitting balance - Comments: Reliant on external assist and UE support to maintain static sitting, pt with persistent posterior and L lateral lean, increased difficulty maintaining midline Postural control: Left lateral lean;Posterior lean Standing balance support: Bilateral upper extremity supported Standing balance-Leahy Scale: Zero Standing balance comment: Reliant on BUE support, external assist and bilateral knees blocked in stedy frame to maintain static standing                            Cognition Arousal/Alertness: Awake/alert Behavior During Therapy: Flat affect Overall Cognitive Status: Impaired/Different from baseline Area of Impairment: Attention;Following commands;Awareness;Problem solving;Safety/judgement  Current Attention Level: Sustained   Following Commands: Follows one step commands with increased time;Follows one step commands  inconsistently Safety/Judgement: Decreased awareness of safety;Decreased awareness of deficits Awareness: Emergent Problem Solving: Requires verbal cues;Requires tactile cues General Comments: Suspect apparent cognitive impairment more related to pain/fatigue      Exercises General Exercises - Lower Extremity Ankle Circles/Pumps: AROM;Both;Seated Long Arc Quad: AROM;Left;AAROM;Right;Seated Other Exercises Other Exercises: Poor tolerance for BUE elbow flex/ext and shoulder AROM/PROM;    General Comments General comments (skin integrity, edema, etc.): family present and supportive; pt awaiting new bed/mattress for pressure relief, therefore pt resting in bed on geomat and pillows for added sacral comfort/pressure relief; bilateral PRAFOs donned for pressure relief and to encourage ankle DF      Pertinent Vitals/Pain Pain Assessment: Faces Faces Pain Scale: Hurts whole lot Pain Location: buttocks, bilateral knees (R>L) Pain Descriptors / Indicators: Guarding;Grimacing;Discomfort;Moaning Pain Intervention(s): Monitored during session;Limited activity within patient's tolerance;Repositioned    Home Living                      Prior Function            PT Goals (current goals can now be found in the care plan section) Acute Rehab PT Goals Patient Stated Goal: Decreased pain Progress towards PT goals: Progressing toward goals (slowly)    Frequency    Min 2X/week      PT Plan Current plan remains appropriate    Co-evaluation PT/OT/SLP Co-Evaluation/Treatment: Yes Reason for Co-Treatment: Complexity of the patient's impairments (multi-system involvement);For patient/therapist safety;To address functional/ADL transfers   OT goals addressed during session: Other (comment);ADL's and self-care (Functional mobility and transfers using steady in preparation for increased participation in ADL's. Static sitting at EOB/Steady)      AM-PAC PT "6 Clicks" Mobility    Outcome Measure  Help needed turning from your back to your side while in a flat bed without using bedrails?: Total Help needed moving from lying on your back to sitting on the side of a flat bed without using bedrails?: Total Help needed moving to and from a bed to a chair (including a wheelchair)?: Total Help needed standing up from a chair using your arms (e.g., wheelchair or bedside chair)?: Total Help needed to walk in hospital room?: Total Help needed climbing 3-5 steps with a railing? : Total 6 Click Score: 6    End of Session Equipment Utilized During Treatment: Gait belt Activity Tolerance: Patient limited by fatigue;Patient limited by pain Patient left: in bed;with call bell/phone within reach;with family/visitor present Nurse Communication: Mobility status;Need for lift equipment PT Visit Diagnosis: Other abnormalities of gait and mobility (R26.89);Muscle weakness (generalized) (M62.81);Pain     Time: 0355-9741 PT Time Calculation (min) (ACUTE ONLY): 24 min  Charges:  $Therapeutic Activity: 8-22 mins                     Mabeline Caras, PT, DPT Acute Rehabilitation Services  Pager [phone] 01/05/2021, 1:07 PM

## 2021-01-05 NOTE — Progress Notes (Signed)
Physical Therapy Treatment Patient Details Name: Carrie Monroe MRN: [id number] DOB: 1935-09-23 Today's Date: 01/05/2021    History of Present Illness Pt is an 85 y.o. female admitted 01/02/21 with weakness and progressive immobility. Workup for CHF exacerbation. Pt also with c/o L shoulder and R knee pain; suspicion for L rib fx, AC and GH joint spurring, chronic supraspinatus tendon tear; R knee effusion. S/p R knee aspiration 8/10. Pt with wound to L buttock. PMH includes CAD, AS s/p TAVR. HF, HTN, DM2, afib, CKD, obesity.   PT Comments    Pt slowly progressing with mobility; pain remains a significant limiting factor to pt's ability to mobilize. Pt requires maxA+2 for bed mobility and modA+2 for standing with Stedy frame. Pt limited by pain, diffuse muscle weakness, decreased activity tolerance and impaired balance. Pt motivated to participate despite pain. Continue to recommend SNF-level therapies to maximize functional mobility and independence prior to return home.   Follow Up Recommendations  SNF;Supervision for mobility/OOB     Equipment Recommendations  TBD - if patient to return home, will likely need Wheelchair (bariatric), Wheelchair cushion, 3in1 (bariatric), Hospital bed, Hoyer lift   Recommendations for Other Services       Precautions / Restrictions Precautions Precautions: Fall;Other (comment) Precaution Comments: painful sore on buttocks Restrictions Weight Bearing Restrictions: No    Mobility  Bed Mobility Overal bed mobility: Needs Assistance Bed Mobility: Supine to Sit     Supine to sit: Max assist;+2 for physical assistance;HOB elevated     General bed mobility comments: Repeated verbal/tactile cues for sequencing, requiring maxA+2 for BLE management, trunk elevation and scooting hips to EOB; limited by pain    Transfers Overall transfer level: Needs assistance Equipment used: Ambulation equipment used Transfers: Sit to/from Stand Sit to Stand:  Mod assist;+2 physical assistance;From elevated surface         General transfer comment: Required consistent modA+2 for sit<>stand from EOB into stedy frame and from stedy seat; verbal/tactile cues for sequencing and how to assist; reliant on assist to place BUE support on stedy rail, assist to flex R knee; poor eccentric control into sitting  Ambulation/Gait             General Gait Details: unable   Stairs             Wheelchair Mobility    Modified Rankin (Stroke Patients Only)       Balance Overall balance assessment: Needs assistance Sitting-balance support: Bilateral upper extremity supported;No upper extremity supported Sitting balance-Leahy Scale: Poor Sitting balance - Comments: Initial modA to maintain static sitting, progressing to min guard, but still reliant on UE support and frequent verbal/tactile cues to achieve midline posture Postural control: Left lateral lean;Posterior lean   Standing balance-Leahy Scale: Zero Standing balance comment: Reliant on BUE support, external assist and bilateral knees blocked in stedy frame to maintain static standing                            Cognition Arousal/Alertness: Awake/alert Behavior During Therapy: Flat affect Overall Cognitive Status: Impaired/Different from baseline Area of Impairment: Attention;Following commands;Awareness;Problem solving                       Following Commands: Follows one step commands with increased time;Follows one step commands inconsistently   Awareness: Emergent Problem Solving: Requires verbal cues General Comments: Suspect apparent cognitive impairment more related to pain/fatigue      Exercises  Other Exercises Other Exercises: Poor tolerance for BUE elbow flex/ext and shoulder AROM/PROM; PROM bilateral heel slides while supine but pt with poor tolerance of knee flexion    General Comments General comments (skin integrity, edema, etc.): Pt's son  and daughter present, supportive; family concerned with pt's persistent sacral soreness despite pressure relief mattress (RN aware). Pt seated on geomat in recliner for pressure relief      Pertinent Vitals/Pain Pain Assessment: Faces Faces Pain Scale: Hurts whole lot Pain Location: buttocks(worst), lt shoulder, rt knee Pain Descriptors / Indicators: Guarding;Grimacing;Discomfort;Moaning Pain Intervention(s): Monitored during session;Limited activity within patient's tolerance;Repositioned    Home Living                      Prior Function            PT Goals (current goals can now be found in the care plan section) Progress towards PT goals: Progressing toward goals (slowly)    Frequency    Min 2X/week      PT Plan Current plan remains appropriate    Co-evaluation PT/OT/SLP Co-Evaluation/Treatment: Yes Reason for Co-Treatment: Complexity of the patient's impairments (multi-system involvement);For patient/therapist safety;To address functional/ADL transfers          AM-PAC PT "6 Clicks" Mobility   Outcome Measure  Help needed turning from your back to your side while in a flat bed without using bedrails?: Total Help needed moving from lying on your back to sitting on the side of a flat bed without using bedrails?: Total Help needed moving to and from a bed to a chair (including a wheelchair)?: Total Help needed standing up from a chair using your arms (e.g., wheelchair or bedside chair)?: Total Help needed to walk in hospital room?: Total Help needed climbing 3-5 steps with a railing? : Total 6 Click Score: 6    End of Session Equipment Utilized During Treatment: Gait belt Activity Tolerance: Patient limited by pain Patient left: in chair;with call bell/phone within reach;with chair alarm set;with family/visitor present Nurse Communication: Mobility status;Need for lift equipment PT Visit Diagnosis: Other abnormalities of gait and mobility (R26.89);Muscle  weakness (generalized) (M62.81);Pain     Time: 0935-1003 PT Time Calculation (min) (ACUTE ONLY): 28 min  Charges:  $Therapeutic Activity: 8-22 mins                     Mabeline Caras, PT, DPT Acute Rehabilitation Services  Pager [phone] Office [place] 01/05/2021, 11:56 AM

## 2021-01-05 NOTE — Therapy (Signed)
Occupational Therapy Treatment Patient Details Name: Carrie Monroe MRN: [id number] DOB: 04-02-1936 Today's Date: 01/05/2021    History of present illness Pt is an 85 y.o. female admitted 01/02/21 with weakness and progressive immobility. Workup for CHF exacerbation. Pt also with c/o L shoulder and R knee pain; suspicion for L rib fx, AC and GH joint spurring, chronic supraspinatus tendon tear; R knee effusion. S/p R knee aspiration 8/10. Pt with wound to L buttock. PMH includes CAD, AS s/p TAVR. HF, HTN, DM2, afib, CKD, obesity.   OT comments  Pt seen for second therapy session today with focus on functional mobility/transfers, transfer from recliner chair to stedy and bed mobility/repositioning in preparation for increased participation in ADL and self care tasks. Pt was max A -total +2 from recliner chair and total A +2 for bed mobility and repositioning. Pain is limiting factor in addition to decreased activity tolerance, deconditioning & weakness. Recommend +2 assist & stedy for therapy vs Maximove for RN staff PRN. Will cont to follow acutely for OT toward plan of care/goals to maximize independence.   Follow Up Recommendations  SNF;Supervision/Assistance - 24 hour    Equipment Recommendations  Other (comment) (Defer to next venue)    Recommendations for Other Services      Precautions / Restrictions Precautions Precautions: Fall;Other (comment) Precaution Comments: painful sore on buttocks Restrictions Weight Bearing Restrictions: No       Mobility Bed Mobility Overal bed mobility: Needs Assistance Bed Mobility: Sit to Supine;Rolling Rolling: Max assist    Sit to supine: Max assist;+2 for physical assistance;Total assist   General bed mobility comments: MaxA+2 to totalA for return to supine via helicopter technique; pt limited by pain and fatigue, requiring max-totalA for trunk and BLE management; totalA for scooting up in bed; maxA to roll R/L for pressure relief pad  positioning; pt left with partial roll onto L-side for pressure relief due to sacral discomfort on right.    Transfers Overall transfer level: Needs assistance Equipment used: Ambulation equipment used Transfers: Sit to/from Stand Sit to Stand: Max assist;+2 physical assistance         General transfer comment: MaxA+2 to stand from low recliner height into stedy standing frame; pt requires max verbal/tactile cues for sequencing and to extend hips/trunk/neck, pt able to achieve near fully upright with heavy external assist; additional stand from stedy frame with maxA+2; very poor eccentric control into sitting    Balance Overall balance assessment: Needs assistance Sitting-balance support: Bilateral upper extremity supported;No upper extremity supported Sitting balance-Leahy Scale: Poor Sitting balance - Comments: Reliant on external assist and UE support to maintain static sitting, pt with persistent posterior and L lateral lean, increased difficulty maintaining midline Postural control: Left lateral lean;Posterior lean Standing balance support: Bilateral upper extremity supported Standing balance-Leahy Scale: Zero Standing balance comment: Reliant on BUE support, external assist and bilateral knees blocked in stedy frame to maintain static standing          ADL either performed or assessed with clinical judgement   ADL Overall ADL's : Needs assistance/impaired      Functional mobility during ADLs: +2 for safety/equipment;+2 for physical assistance;Maximal assistance - total assist(Using stedy during session for OT/PT co-treat) General ADL Comments: Pt pain is limiting factor. Pt was assisted from recliner chair back to bed. She was Max A +2 sit to stand from recliner to stedy and transfer to bed. Performed second sit to stand before getting back to bed, again with Max A-total A+2.  Cognition Arousal/Alertness: Awake/alert Behavior During Therapy: Flat affect Overall  Cognitive Status: Impaired/Different from baseline Area of Impairment: Attention;Following commands;Awareness;Problem solving;Safety/judgement     Current Attention Level: Sustained   Following Commands: Follows one step commands with increased time;Follows one step commands inconsistently Safety/Judgement: Decreased awareness of safety;Decreased awareness of deficits Awareness: Emergent Problem Solving: Requires verbal cues;Requires tactile cues General Comments: Suspect apparent cognitive impairment more related to pain/fatigue        Exercises  Other Exercises: Poor tolerance for BUE elbow flex/ext and shoulder AROM/PROM in preparation for reaching/holding on to stedy during transfers;      General Comments family present and supportive; pt awaiting new bed/mattress for pressure relief, therefore pt resting in bed on geomat and pillows for added sacral comfort/pressure relief; bilateral PRAFOs donned for pressure relief and to encourage ankle DF    Pertinent Vitals/ Pain       Pain Assessment: Faces Faces Pain Scale: Hurts whole lot Pain Location: buttocks, bilateral knees (R>L) Pain Descriptors / Indicators: Guarding;Grimacing;Discomfort;Moaning Pain Intervention(s): Monitored during session;Limited activity within patient's tolerance;Repositioned   Frequency  Min 2X/week        Progress Toward Goals  OT Goals(current goals can now be found in the care plan section)  Progress towards OT goals: Progressing toward goals  Acute Rehab OT Goals Patient Stated Goal: Decreased pain Time For Goal Achievement: 01/17/21 Potential to Achieve Goals: [place] Discharge plan remains appropriate;Frequency remains appropriate    Co-evaluation    PT/OT/SLP Co-Evaluation/Treatment: Yes Reason for Co-Treatment: Complexity of the patient's impairments (multi-system involvement);For patient/therapist safety;To address functional/ADL transfers   OT goals addressed during session:  ADL's and self-care;Other (comment) (Functional mobility & transfers using stedy.)      AM-PAC OT "6 Clicks" Daily Activity     Outcome Measure   Help from another person eating meals?: A Little Help from another person taking care of personal grooming?: A Little Help from another person toileting, which includes using toliet, bedpan, or urinal?: A Lot Help from another person bathing (including washing, rinsing, drying)?: A Lot Help from another person to put on and taking off regular upper body clothing?: A Lot Help from another person to put on and taking off regular lower body clothing?: A Lot 6 Click Score: 14    End of Session Equipment Utilized During Treatment: Other (comment) Charlaine Dalton)  OT Visit Diagnosis: Unsteadiness on feet (R26.81);Other abnormalities of gait and mobility (R26.89);Muscle weakness (generalized) (M62.81);Pain Pain - Right/Left: Right Pain - part of body:  (buttocks (worst), left shoulder, right knee)   Activity Tolerance Patient limited by pain;Other (comment) (deconditioned state)   Patient Left in bed;with call bell/phone within reach;with family/visitor present   Nurse Communication Need for lift equipment        Time: 1103-1130 OT Time Calculation (min): 27 min  Charges: OT General Charges $OT Visit: 1 Visit OT Treatments $Therapeutic Activity: 8-22 mins   Rees Santistevan Beth Dixon, OTR/L 01/05/2021, 1:14 PM

## 2021-01-05 NOTE — Progress Notes (Signed)
Progress Note  Patient Name: Carrie Monroe Date of Encounter: 01/05/2021  [facility] Cardiologist: Sanda Klein, MD   Subjective   Was heavily sedated all day yesterday after a single dose of hydrocodone. Family reports she has previously been sedated for 48 h after receiving narcotics, but has tolerated tramadol before. Denies dyspnea. Bottom hurts. Knee not better yet after arthrocentesis and steroid infusion.  Inpatient Medications    Scheduled Meds:  amiodarone  200 mg Oral Daily   apixaban  2.5 mg Oral BID   vitamin C  1,000 mg Oral Daily   bupivacaine  10 mL Infiltration Once   cholecalciferol  2,000 Units Oral Daily   feeding supplement  237 mL Oral BID BM   ferrous sulfate  325 mg Oral Q breakfast   furosemide  40 mg Intravenous Daily   Gerhardt's butt cream   Topical BID   methylPREDNISolone acetate  40 mg Intra-articular Once   rosuvastatin  10 mg Oral Daily   vitamin B-12  100 mcg Oral Daily   Continuous Infusions:  cefTRIAXone (ROCEPHIN)  IV Stopped (01/04/21 1708)   [START ON 01/06/2021] vancomycin     PRN Meds: acetaminophen **OR** acetaminophen, dextromethorphan-guaiFENesin, hydrALAZINE, HYDROcodone-acetaminophen, HYDROmorphone (DILAUDID) injection, ipratropium-albuterol, metoprolol tartrate, ondansetron **OR** ondansetron (ZOFRAN) IV, polyethylene glycol, senna-docusate   Vital Signs    Vitals:   01/04/21 1147 01/04/21 1932 01/05/21 0000 01/05/21 0416  BP: (!) 134/46 (!) 123/43  (!) 146/52  Pulse: 60 (!) 59  61  Resp: 18 17  19   Temp: 98.8 F (37.1 C) 99.6 F (37.6 C)  99.4 F (37.4 C)  TempSrc: Oral Oral  Oral  SpO2: 95% 94%  93%  Weight:   82.1 kg   Height:        Intake/Output Summary (Last 24 hours) at 01/05/2021 0930 Last data filed at 01/05/2021 0506 Gross per 24 hour  Intake 100.07 ml  Output 1300 ml  Net -1199.93 ml   Last 3 Weights 01/05/2021 01/04/2021 01/03/2021  Weight (lbs) 181 lb 177 lb 169 lb 8.5 oz  Weight (kg) 82.1  kg 80.287 kg 76.9 kg      Telemetry    Sinus Loletha Grayer - Personally Reviewed  ECG    Sinus bradycardia, LBBB - Personally Reviewed  Physical Exam  Very weak, more awake than yesterday GEN: No acute distress.   Neck: No JVD Cardiac: RRR brady, paradoxically split S2, 1-2/6 aortic ejection murmur, no diastolic murmurs, rubs, or gallops.  Respiratory: Clear to auscultation bilaterally. GI: Soft, nontender, non-distended  MS: No edema; No deformity. Neuro:  Nonfocal  Psych: Normal affect   Labs    High Sensitivity Troponin:  No results for input(s): TROPONINIHS in the last 720 hours.    Chemistry Recent Labs  Lab 01/02/21 1133 01/03/21 0243 01/04/21 0503 01/05/21 0419  NA 134* 132* 135 136  K 3.9 3.4* 4.2 4.2  CL 101 98 102 101  CO2 25 26 27 27   GLUCOSE 115* 100* 169* 152*  BUN 42* 41* 47* 49*  CREATININE 1.67* 1.62* 1.94* 2.08*  CALCIUM 8.9 8.8* 8.6* 9.0  PROT 6.2* 5.7*  --   --   ALBUMIN 2.2* 2.0*  --   --   AST 69* 59*  --   --   ALT 61* 57*  --   --   ALKPHOS 92 92  --   --   BILITOT 1.1 0.9  --   --   GFRNONAA 30* 32* 25* 23*  ANIONGAP  8 8 6 8      Hematology Recent Labs  Lab 01/02/21 1133 01/03/21 0243 01/03/21 1915 01/04/21 0503  WBC 7.6 6.9  --  7.4  RBC 2.94* 2.80* 2.79* 2.61*  HGB 9.1* 8.7*  --  8.0*  HCT 28.6* 26.4*  --  24.7*  MCV 97.3 94.3  --  94.6  MCH 31.0 31.1  --  30.7  MCHC 31.8 33.0  --  32.4  RDW 14.6 14.5  --  14.7  PLT 147* 135*  --  135*    BNP Recent Labs  Lab 01/02/21 1209  BNP 661.2*     DDimer No results for input(s): DDIMER in the last 168 hours.   Radiology    No results found.  Cardiac Studies   ECHO: 08/17/2020 2D DIMENSIONS  AORTA          Values     Normal RangeMAIN PA      Values     Normal Range       Annulus:  nm*  cm    [1.9 - 2.7]    PA Main:  nm*  cm    [1.5 - 2.1]     Aorta Sin:  nm*  cm    [2.4 - 3.6] RIGHT VENTRICLE   ST Junction:  nm*  cm    [2 - 3.2]      RV Base:   4.3 cm    [2.5 - 4.1]      Asc.Aorta:   3.2 cm    [1.9 - 3.5]     RV Mid:   2.6 cm    [1.9 - 3.5]  LEFT VENTRICLE                         RV Length:  nm*  cm    [  ]         LVIDd:   5.3 cm    [3.7 - 5.3] RIGHT ATRIUM         LVIDs:   4.1 cm    [2.2 - 3.4]    RA Area:  25   cm2   [ <= 20]        LVEDVi:  nm*  ml/m2 [29 - 61]         RAVi:  47   ml/m2 [15 - 27]        LVESVi:  nm*  ml/m2 [8 - 24]    INFERIOR VENA CAVA            FS:  23   %     [ >= 25]       Max.IVC:   1.7 cm    [ <= 2.1]           SWT:   1.3 cm    [0.6 - 0.9]    Min.IVC:  nm*  cm    [ <= 1.7]           PWT:   1.4 cm    [0.6 - 0.9] __________________  LEFT ATRIUM                           nm* - not measured       LA Diam:   4.5 cm    [2.7 - 3.8]       LA Area:  27   cm2   [ <= 20]     LA  Volume:  97   ml    [22 - 52]          LAVi:  50   ml/m2 [16 - 34]   ECHOCARDIOGRAPHIC DESCRIPTIONS -----------------------------------------------  AORTIC ROOT          Size: Normal    Dissection: INDETERM FOR DISSECTION   AORTIC VALVE      Leaflets: PROSTHETIC            Morphology: Not seen      Mobility: Not seen   LEFT VENTRICLE                                      Anterior: HYPOCONTRACTILE          Size: Normal                                 Lateral: HYPOCONTRACTILE   Contraction: MOD GLOBAL DECREASE                     Septal: HYPOCONTRACTILE    Closest EF: 40% (Estimated)  Calc.EF: 46% (2D)      Apical: HYPOCONTRACTILE     LV masses: No Masses                             Inferior: HYPOCONTRACTILE           LVH: MILD LVH CONCENTRIC                  Posterior: HYPOCONTRACTILE   LV GLS(GE): -12.2% Normal Range [ <= -16]  Dias.FxClass: N/A   MITRAL VALVE      Leaflets: ABNORMAL                Mobility: Fully mobile    Morphology: THICKENED LEAFLET(S)   LEFT ATRIUM          Size: SEVERELY ENLARGED     LA masses: No masses                Normal IAS   MAIN PA          Size: Not seen   PULMONIC VALVE    Morphology: Normal      Mobility: Fully  Mobile   RIGHT VENTRICLE          Size: Normal                    Free wall: HYPOCONTRACTILE   Contraction: MILD GLOBAL DECREASE      RV masses: No Masses         TAPSE:   1.8 cm,  Normal Range [>= 1.6 cm]       RV Note: TV S' 0.95ms   TRICUSPID VALVE      Leaflets: Normal                  Mobility: Fully mobile    Morphology: Normal   RIGHT ATRIUM          Size: MODERATELY ENLARGED        RA Other: None     RA masses: No masses   PERICARDIUM         Fluid: No effusion   INFERIOR VENACAVA          Size: Normal  Normal respiratory collapse   DOPPLER ECHO and OTHER SPECIAL PROCEDURES ------------------------------------     Aortic: No AR                  TAVR      2.2 m/s peak vel   19 mmHg peak grad  10 mmHg mean grad 1.2 cm2 by DOPPLER   LVOT Diam: 1.7 cm. Dimensionless Index: 0.55      Mitral: MODERATE MR            No MS     MV Inflow E Vel.= 104.0 cm/s  MV Annulus E'Vel.= 6.0 cm/s  E/E'Ratio= 17   Tricuspid: MILD TR                No TS             3.4 m/s peak TR vel   50 mmHg peak RV pressure   Pulmonary: MODERATE PR            No PS     CARDIAC MONITOR 08/12/2020 Impression   1. Predominantly sinus mechanism rhythms and 4 occurrences of supraventricular tachycardia, the longest lasting 12 beats. (0.17% PAC burden) 2. No ventricular ectopy noted 3. Patient triggered events notable for artifact/lead loss preceded by sinus  bradycardia 4. Automatically detected events include supraventricular ectopy(PACs) and SVT   Narrative   This result has an attachment that is not available.  *The observed rhythms are sinus bradycardia to sinus rhythm. *The Maximum Heart Rate recorded was 120 bpm, Day 3 / 12:54:10 pm, the Minimum Heart Rate recorded was 35 bpm, Day 6 / 12:05:18 pm and the Average Heart Rate was 54 bpm. *There were 910 PSVCs  with a burden of 0.17 %. There were 4 occurrences of Supraventricular Tachycardia with the longest episode 12 beats, Day 2 / 11:33:15 am  and the fastest episode 120 bpm, Day 3 / 12:54:14 pm. *There was1 Patient triggeredevents.   CARDIAC CATH/PCI, 12/28/2016 A STENT SYNERGY DES 3X16 drug eluting stent was successfully placed. Mid Cx lesion, 99 %stenosed. Post intervention, there is a 0% residual stenosis. A STENT SYNERGY DES 2.5X28 drug eluting stent was successfully placed. Mid LAD lesion, 90 %stenosed. Post intervention, there is a 0% residual stenosis. Ost 2nd Diag lesion, 70 %stenosed. Post intervention, there is a 60% residual stenosis.   1. Severe stenosis mid LAD at bifurcation of the Diagonal. Successful PTCA/DES x 1 mid LAD. Successful PTCA with balloon angioplasty only ostium Diagonal. 2. Severe stenosis mid Circumflex. Successful PTCA/DES x 1 mid to distal Circumflex.   Recommendations. Will continue ASA and Plavix for at least 3 months. Follow renal function and H/H in am. I will plan medical management of the moderate stenosis in the mid RCA. I will discuss f/u with Dr. Roxy Manns for second surgical consult. She will need staged CT scans in preparation for TAVR based on renal function. She will have TAVR then will have carotid endarterectomy when all cardiac issues are stable in several months. Diagnostic Dominance: Right     Intervention        Patient Profile     85 y.o. female with a hx of CAD s/p DES mCFX/mLAD w/ PTCA Diag and AS s/p TAVR 2018, persistent Afib (w/ intol BB/CCB 2nd hypotension/dizziness, now on amiodarone and Eliquis), s/p R TCAR, HFrCHF III w/ EF 40-45% by echo 07/2020, CKD III, DM, HTN, HLD, LBBB, anemia presents with volume overload, extreme weakness and immobility, multiple joint complaints  Assessment & Plan  CHF exacerbation: hard to assess functional status due to immobility. Not dyspneic at rest. Most recent LVEF 40-45% with predominantly global hypokinesis pattern.  Medical therapy limited by dizziness/bradycardia/hypotension.  Has an extremely broad LBBB and may benefit from  CRT-P.  She has lost a lot of true weight, "dry weight" now seems to be around 180 lb. Conduction system disease/drug-induced bradycardia: She has severe persistent sinus bradycardia due to amiodarone therapy and advanced age related conduction system disease.  The recent monitor showed that her minimum heart rate in sinus bradycardia was 35 bpm and her average heart rate was 54 bpm.  Some of her fatigue can readily be explained by chronotropic incompetence.  She would benefit from pacemaker therapy, specifically CRT-P with rate response.  This was proposed by her cardiologist at [facility], as well. The patient today firmly but declines any further invasive procedures. CAD: No angina.  4 years have passed since her revascularization procedure, which was prompted by the need for TAVR, rather than symptomatic CAD.  Cardiomyopathy is felt to be primarily nonischemic. Afib: Had persistent atrial fibrillation rapid ventricular response with difficulty in rate control due to hypotension with conventional AV blocking agents.  Developed tachycardia related cardiomyopathy with EF down to 20%, failed initial cardioversion, but succeeded on amiodarone and LVEF recovered to 40-45% after 3 months in sinus rhythm .  On anticoagulation with Eliquis.  No overt bleeding. Amiodarone: Started after she failed cardioversion and has helped maintain sinus rhythm for over 6 months now. Acute on CKD 3b: Creatinine has increased further, will hold diuretics.  Residual edema would not be surprising with severe hypoalbuminemia. Plan to resume PO diuretics as renal function returns to baseline.  Anemia: Hyporegenerative, normocytic normochromic.  Contributing to fatigue and weakness.  No evidence of iron deficiency or other nutritional cause.  Could be related to CKD +/-anemia of chronic disease. Probable pseudogout: arthrocentesis results, elevated ESR/CRP strongly support this. Shoulder symptoms are probably related to a chronic rotator  cuff tear. Start colchicine, dose adjusted for renal function. May also need a brief course of systemic steroids. Avoid NSAIDs. Severe  Deconditioning: Prognosis is not good.  May not be the best candidate for invasive/aggressive therapy and palliative care may need to be considered.  I do not think her family is ready for that decision at this time though. Oversedation: she was completely knocked out by Vicodin yesterday. Family reports she has had a similar response to narcotics before and it takes her 48h to recover. She has done well with tramadol so I ordered that instead and stopped the Vicodin and Dilaudid.     For questions or updates, please contact [place] Please consult [email or web address] for contact info under        Signed, Sanda Klein, MD  01/05/2021, 9:30 AM

## 2021-01-05 NOTE — Progress Notes (Signed)
Initial Nutrition Assessment  DOCUMENTATION CODES:   Obesity unspecified  INTERVENTION:   -Continue Ensure Enlive po BID, each supplement provides 350 kcal and 20 grams of protein  -MVI with minerals daily  NUTRITION DIAGNOSIS:   Increased nutrient needs related to chronic illness (CHF) as evidenced by estimated needs.  GOAL:   Patient will meet greater than or equal to 90% of their needs  MONITOR:   PO intake, Supplement acceptance, Labs, Weight trends, Skin, I & O's  REASON FOR ASSESSMENT:   Low Braden    ASSESSMENT:   Carrie Monroe is a 85 y.o. female with a past medical history of severe aortic stenosis status post-TAVR, coronary artery disease status post PCI/DES, chronic kidney disease, CHF, history of left breast cancer, carotid artery stenosis status post right TCAR, DM type II diet-controlled, history of nephrolithiasis, hyperlipidemia, hypertension, squamous cell carcinoma of the scalp  Pt admitted with orthopnea and edema likely due to CHF exacerbation.   8/10- s/p rt knee effusion  Reviewed I/O's: -900 ml x 24 hours and -3.8 L since admission  UOP: 1.3 L x 24 hours  Spoke with pt and family member at bedside. Pt has had a good appetite, noted she consumed about 75% of her breakfast tray. Documented meal completion 60-100%.   Intake has been erratic over the past month. Pt shares that she will have "good days and bad days". Typical meal intake is a large breakfast and a smaller lunch and dinner. Pt enjoying Ensure supplements, but does not consume at home.   Reviewed wt hx; wt has been stable over the past 6 months. Per, pt, she suspects ome wt loss secondary to diuresis. Her UBW is around 178#.   Medications reviewed and include vitamin C, vitamin D, colchicine, ferrous sulfate, and vitamin B-12.   Discussed importance of good meal and supplement intake to promote healing.   Labs reviewed.   NUTRITION - FOCUSED PHYSICAL EXAM:  Flowsheet Row Most  Recent Value  Orbital Region Mild depletion  Upper Arm Region No depletion  Thoracic and Lumbar Region No depletion  Buccal Region No depletion  Temple Region Mild depletion  Clavicle Bone Region No depletion  Clavicle and Acromion Bone Region No depletion  Scapular Bone Region No depletion  Dorsal Hand No depletion  Patellar Region Mild depletion  Anterior Thigh Region Mild depletion  Posterior Calf Region Mild depletion  Edema (RD Assessment) Mild  Hair Reviewed  Eyes Reviewed  Mouth Reviewed  Skin Reviewed  Nails Reviewed       Diet Order:   Diet Order             Diet 2 gram sodium Room service appropriate? Yes; Fluid consistency: Thin; Fluid restriction: 1500 mL Fluid  Diet effective now                   EDUCATION NEEDS:   Education needs have been addressed  Skin:  Skin Assessment: Skin Integrity Issues: Skin Integrity Issues:: Stage II, Other (Comment) Stage II: rt posterior thigh Other: MASD/ IAD to lt buttock  Last BM:  01/04/21  Height:   Ht Readings from Last 1 Encounters:  01/03/21 5\' 2"  (1.575 m)    Weight:   Wt Readings from Last 1 Encounters:  01/05/21 82.1 kg    Ideal Body Weight:  50 kg  BMI:  Body mass index is 33.1 kg/m.  Estimated Nutritional Needs:   Kcal:  6283-1517  Protein:  100-115 grams  Fluid:  1.5  Ricka Burdock, RD, LDN, [place] Registered Dietitian II Certified Diabetes Care and Education Specialist Please refer to [facility] for RD and/or RD on-call/weekend/after hours pager

## 2021-01-05 NOTE — Care Management Important Message (Signed)
Important Message  Patient Details  Name: Carrie Monroe MRN: [id number] Date of Birth: [birth year]   Medicare Important Message Given:  Yes     Shelda Altes 01/05/2021, 10:08 AM

## 2021-01-05 NOTE — Therapy (Signed)
Occupational Therapy Treatment Patient Details Name: Carrie Monroe MRN: [id number] DOB: Apr 21, 1936 Today's Date: 01/05/2021    History of present illness Pt is an 85 y.o. female admitted 01/02/21 with weakness and progressive immobility. Workup for CHF exacerbation. Pt also with c/o L shoulder and R knee pain; suspicion for L rib fx, AC and GH joint spurring, chronic supraspinatus tendon tear; R knee effusion. S/p R knee aspiration 8/10. Pt with wound to L buttock. PMH includes CAD, AS s/p TAVR. HF, HTN, DM2, afib, CKD, obesity.   OT comments  Pt seen for co-treat with PT today with OT focus on bed mobility, functional transfers using stedy, sitting up at EOB and limited grooming. Pt is making slow progress toward plan of care and OT goals. Pain, decreased activity tolerance and deconditioning is limiting factor. Pt is overall Max A +2 for bed mob and Mod A +2 with stedy for sit to stand and transfer to chair.    Follow Up Recommendations  SNF;Supervision/Assistance - 24 hour    Equipment Recommendations  Other (comment) (Defer to next venue)    Recommendations for Other Services      Precautions / Restrictions Precautions Precautions: Fall;Other (comment) Precaution Comments: painful sore on buttocks Restrictions Weight Bearing Restrictions: No       Mobility Bed Mobility Overal bed mobility: Needs Assistance Bed Mobility: Supine to Sit     Supine to sit: Max assist;+2 for physical assistance;HOB elevated     General bed mobility comments: Repeated verbal/tactile cues for sequencing, requiring maxA+2 for BLE management, trunk elevation and scooting hips to EOB; limited by pain    Transfers Overall transfer level: Needs assistance Equipment used: Ambulation equipment used Transfers: Sit to/from Stand Sit to Stand: Mod assist;+2 physical assistance;From elevated surface         General transfer comment: Required consistent modA+2 for sit<>stand from EOB into stedy  frame and from stedy seat; verbal/tactile cues for sequencing and how to assist; reliant on assist to place BUE support on stedy rail, assist to flex R knee; poor eccentric control into sitting. Consistent vc's to maintain and reposition trunk in midline (left lateral lean).    Balance Overall balance assessment: Needs assistance Sitting-balance support: Bilateral upper extremity supported;No upper extremity supported Sitting balance-Leahy Scale: Poor Sitting balance - Comments: Initial modA to maintain static sitting, progressing to min guard, but still reliant on UE support and frequent verbal/tactile cues to achieve midline posture Postural control: Left lateral lean;Posterior lean   Standing balance-Leahy Scale: Zero Standing balance comment: Reliant on BUE support, external assist and bilateral knees blocked in stedy frame to maintain static standing                           ADL either performed or assessed with clinical judgement   ADL Overall ADL's : Needs assistance/impaired     Grooming: Wash/dry hands;Wash/dry face;Bed level;Min guard;Supervision/safety (Increased time and vc's required) Grooming Details (indicate cue type and reason): Max encouragement to participate                             Functional mobility during ADLs: +2 for safety/equipment;+2 for physical assistance;Moderate assistance;Maximal assistance (Using steady during treatment session w/ PT/OT co-treat) General ADL Comments: Pt pain is limiting factor. RN offered pain medication and states that pt declined. Pt performed brief grooming at bed level and was Mod A +2 for bed mobility, sitting  at EOB and using steady for sit to stand and transfer to recliner chair.     Cognition Arousal/Alertness: Awake/alert Behavior During Therapy: Flat affect Overall Cognitive Status: Impaired/Different from baseline Area of Impairment: Attention;Following commands;Awareness;Problem solving       Following Commands: Follows one step commands with increased time;Follows one step commands inconsistently   Awareness: Emergent Problem Solving: Requires verbal cues General Comments: Suspect apparent cognitive impairment more related to pain/fatigue        Exercises Other Exercises Other Exercises: Poor tolerance for BUE elbow flex/ext and shoulder AROM/PROM;      General Comments Pt's son and daughter present, supportive; family concerned with pt's persistent sacral soreness despite pressure relief mattress (RN aware). Pt seated on geomat in recliner for pressure relief    Pertinent Vitals/ Pain       Pain Assessment: Faces Faces Pain Scale: Hurts whole lot Pain Location: buttocks(worst), lt shoulder, rt knee Pain Descriptors / Indicators: Guarding;Grimacing;Discomfort;Moaning Pain Intervention(s): Limited activity within patient's tolerance;Monitored during session;Repositioned   Frequency  Min 2X/week        Progress Toward Goals  OT Goals(current goals can now be found in the care plan section)  Progress towards OT goals: Progressing toward goals  Acute Rehab OT Goals Patient Stated Goal: Decreased pain Time For Goal Achievement: 01/17/21 Potential to Achieve Goals: [place] Discharge plan remains appropriate;Frequency remains appropriate    Co-evaluation    PT/OT/SLP Co-Evaluation/Treatment: Yes Reason for Co-Treatment: Complexity of the patient's impairments (multi-system involvement);For patient/therapist safety;To address functional/ADL transfers   OT goals addressed during session: Other (comment);ADL's and self-care (Functional mobility and transfers using steady in preparation for increased participation in ADL's. Static sitting at EOB/Steady)      AM-PAC OT "6 Clicks" Daily Activity     Outcome Measure   Help from another person eating meals?: A Little Help from another person taking care of personal grooming?: A Little Help from another person  toileting, which includes using toliet, bedpan, or urinal?: A Lot Help from another person bathing (including washing, rinsing, drying)?: A Lot Help from another person to put on and taking off regular upper body clothing?: A Lot Help from another person to put on and taking off regular lower body clothing?: A Lot 6 Click Score: 14    End of Session Equipment Utilized During Treatment: Other (comment) Charlaine Dalton)  OT Visit Diagnosis: Unsteadiness on feet (R26.81);Other abnormalities of gait and mobility (R26.89);Muscle weakness (generalized) (M62.81);Pain Pain - Right/Left: Right Pain - part of body:  (Buttocks (worst), left shoulder, knee)   Activity Tolerance Patient limited by pain;Other (comment) (Pt limited by deconditioned state)   Patient Left in chair;with call bell/phone within reach;with family/visitor present   Nurse Communication Other (comment) (OT/PT will come back to assist with transferring back to bed.)        Time: 6599-3570 OT Time Calculation (min): 30 min  Charges: OT General Charges $OT Visit: 1 Visit OT Treatments $Therapeutic Activity: 8-22 mins   Tersa Fotopoulos Beth Dixon, OTR/L 01/05/2021, 12:33 PM

## 2021-01-05 NOTE — TOC Progression Note (Signed)
Transition of Care [facility]) - Progression Note    Patient Details  Name: Carrie Monroe MRN: [id number] Date of Birth: Jul 14, 1935  Transition of Care [facility]) CM/SW Contact  Reece Agar, [place] Phone Number: 01/05/2021, 5:21 PM  Clinical Narrative:    CSW spoke with [facility]iqu[facility] place admin who is aware of this pt family wanting SNF and possibly long term care after. Admin states they have explained to pt daughter in law that there may not be a LTC bed available after SNF. CSW also shared that pt is able to return home or with family after SNF if there are not any LTC beds. Admin rep will follow up with CSW after speaking with upper management on pt offer. CSW will continue to follow pt for DC planning.   Expected Discharge Plan: [place] Barriers to Discharge: Continued Medical Work up  Expected Discharge Plan and Services Expected Discharge Plan: [place] In-house Referral: Clinical Social Work Discharge Planning Services: NA Post Acute Care Choice: [place] Living arrangements for the past 2 months: [place]                                       Social Determinants of Health (SDOH) Interventions    Readmission Risk Interventions Readmission Risk Prevention Plan 03/17/2020  Transportation Screening Complete  PCP or Specialist Appt within 3-5 Days Complete  HRI or [place] Complete  Social Work Consult for [place] Planning/Counseling Complete  Palliative Care Screening Not Applicable  Medication Review Press photographer) Complete  Some recent data might be hidden

## 2021-01-06 LAB — BASIC METABOLIC PANEL
Anion gap: 5 (ref 5–15)
BUN: 48 mg/dL — ABNORMAL HIGH (ref 8–23)
CO2: 26 mmol/L (ref 22–32)
Calcium: 8.8 mg/dL — ABNORMAL LOW (ref 8.9–10.3)
Chloride: 101 mmol/L (ref 98–111)
Creatinine, Ser: 1.84 mg/dL — ABNORMAL HIGH (ref 0.44–1.00)
GFR, Estimated: 27 mL/min — ABNORMAL LOW (ref >60)
Glucose, Bld: 148 mg/dL — ABNORMAL HIGH (ref 70–99)
Potassium: 4.5 mmol/L (ref 3.5–5.1)
Sodium: 132 mmol/L — ABNORMAL LOW (ref 135–145)

## 2021-01-06 LAB — CK: Total CK: 207 U/L (ref 38–234)

## 2021-01-06 LAB — GLUCOSE, CAPILLARY
Glucose-Capillary: 150 mg/dL — ABNORMAL HIGH (ref 70–99)
Glucose-Capillary: 148 mg/dL — ABNORMAL HIGH (ref 70–99)
Glucose-Capillary: 161 mg/dL — ABNORMAL HIGH (ref 70–99)

## 2021-01-06 MED ORDER — DICLOFENAC SODIUM 1 % EX GEL
2.0000 g | Freq: Four times a day (QID) | CUTANEOUS | Status: DC
  Administered 2021-01-06 – 2021-01-09 (×7): 2 g via TOPICAL
  Filled 2021-01-06: qty 100

## 2021-01-06 NOTE — NC FL2 (Addendum)
[place] LEVEL OF CARE SCREENING TOOL     IDENTIFICATION  Patient Name: Carrie Monroe Birthdate: [birth year] Sex: female Admission Date (Current Location): 01/02/2021  [facility] and [place] Number:  Herbalist and Address:  [facility] [place], [place] [address] [place], [place] [address]      Provider Number: [id number]  Attending Physician Name and Address:  Tawni Millers,*  Relative Name and Phone Number:  Tylasia, Fletchall "[name]" Son     [phone]    Current Level of Care: Hospital Recommended Level of Care: [place] Prior Approval Number:   Date Approved/Denied:   PASRR Number:  [id number]  Discharge Plan: SNF    Current Diagnoses: Patient Active Problem List   Diagnosis Date Noted   Paroxysmal atrial fibrillation (HCC)    Tachycardia-bradycardia syndrome ([place])    LBBB (left bundle branch block)    Volume overload 01/02/2021   Dizziness 07/14/2020   Biventricular failure ([place]) 07/14/2020   Pressure injury of skin 03/03/2020   Acute on chronic combined systolic and diastolic CHF (congestive heart failure) ([place]) 03/02/2020   AKI (acute kidney injury) ([place]) 03/02/2020   Thrombocytopenia ([place]) 03/02/2020   Secondary hypercoagulable state ([place]) 01/13/2020   Persistent atrial fibrillation (HCC)    Hypoxia    Acute on chronic diastolic CHF (congestive heart failure), NYHA class 3 ([place]) 11/21/2019   Atrial fibrillation with rapid ventricular response ([place]) 11/21/2019   Fever    Carotid stenosis 03/13/2019   Encounter for medication monitoring 11/27/2017   PICC (peripherally inserted central catheter) in place 11/27/2017   MSSA bacteremia 11/05/2017   Wound infection after surgery 11/04/2017   Anemia 09/27/2017   Hyperlipidemia    History of kidney stones    Hemorrhoids    Exogenous obesity    Chronic diastolic CHF (congestive heart failure) (HCC)    Arthritis    Aortic stenosis, severe     Chronic respiratory failure with hypoxia (HCC)    Hemorrhagic shock (HCC)    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   Carotid artery stenosis    Three-vessel CAD-PCI to LAD and LCx, med management of RCA    Chronic renal insufficiency, stage III (moderate) (HCC) 12/22/2016   Dental abscess- s/p multiple tooth extraction 12/21/16 12/22/2016   CAD- severe 3V CAD     Essential hypertension 01/27/2016   Morbid obesity due to excess calories ([place]) 01/27/2016   Gout 06/22/2013   Dyslipidemia 05/08/2011   Severe aortic stenosis 05/08/2011   Breast cancer of upper-outer quadrant of left female breast ([place]) 03/16/2011    Orientation RESPIRATION BLADDER Height & Weight     Self, Time, Situation, Place  Normal External catheter, Incontinent Weight: 173 lb 15.1 oz (78.9 kg) Height:  5\' 2"  (157.5 cm)  BEHAVIORAL SYMPTOMS/MOOD NEUROLOGICAL BOWEL NUTRITION STATUS      Continent Diet (See DC Summary)  AMBULATORY STATUS COMMUNICATION OF NEEDS Skin   Extensive Assist Verbally Surgical wounds                       Personal Care Assistance Level of Assistance  Bathing, Feeding, Dressing Bathing Assistance: Maximum assistance Feeding assistance: Limited assistance Dressing Assistance: Maximum assistance     Functional Limitations Info  Sight, Speech, Hearing Sight Info: Adequate Hearing Info: Adequate Speech Info: Adequate    SPECIAL CARE FACTORS FREQUENCY  PT (By licensed PT), OT (By licensed OT)     PT Frequency: 5x a week OT  Frequency: 5x a week            Contractures Contractures Info: Not present    Additional Factors Info  Code Status, Allergies Code Status Info: Full Allergies Info: Oxycodone           Current Medications (01/06/2021):  This is the current hospital active medication list Current Facility-Administered Medications  Medication Dose Route Frequency Provider Last Rate Last Admin   acetaminophen (TYLENOL) tablet 650 mg  650 mg  Oral Q6H PRN Imagene Sheller S, DO       Or   acetaminophen (TYLENOL) suppository 650 mg  650 mg Rectal Q6H PRN Imagene Sheller S, DO       amiodarone (PACERONE) tablet 200 mg  200 mg Oral Daily Imagene Sheller S, DO   200 mg at 01/06/21 0936   apixaban (ELIQUIS) tablet 2.5 mg  2.5 mg Oral BID Karren Cobble, RPH   2.5 mg at 01/06/21 0630   ascorbic acid (VITAMIN C) tablet 1,000 mg  1,000 mg Oral Daily Imagene Sheller S, DO   1,000 mg at 01/06/21 0936   bupivacaine (MARCAINE) 0.5 % injection 10 mL  10 mL Infiltration Once Lisette Abu, PA-C       cholecalciferol (VITAMIN D3) tablet 2,000 Units  2,000 Units Oral Daily Imagene Sheller S, DO   2,000 Units at 01/06/21 0935   colchicine tablet 0.3 mg  0.3 mg Oral Daily Croitoru, Mihai, MD   0.3 mg at 01/06/21 1601   diclofenac Sodium (VOLTAREN) 1 % topical gel 2 g  2 g Topical QID Arrien, Jimmy Picket, MD       feeding supplement (ENSURE ENLIVE / ENSURE PLUS) liquid 237 mL  237 mL Oral BID BM Barrett, Rhonda G, PA-C   237 mL at 01/06/21 0936   ferrous sulfate tablet 325 mg  325 mg Oral Q breakfast Heloise Purpura, RPH   325 mg at 01/06/21 0932   Gerhardt's butt cream   Topical BID Damita Lack, MD   Given at 01/06/21 0936   ipratropium-albuterol (DUONEB) 0.5-2.5 (3) MG/3ML nebulizer solution 3 mL  3 mL Nebulization Q4H PRN Amin, Ankit Chirag, MD       methylPREDNISolone acetate (DEPO-MEDROL) injection 40 mg  40 mg Intra-articular Once Lisette Abu, PA-C       metoprolol tartrate (LOPRESSOR) injection 5 mg  5 mg Intravenous Q4H PRN Amin, Jeanella Flattery, MD       multivitamin with minerals tablet 1 tablet  1 tablet Oral Daily Arrien, Jimmy Picket, MD   1 tablet at 01/06/21 0936   ondansetron (ZOFRAN) tablet 4 mg  4 mg Oral Q6H PRN Imagene Sheller S, DO       Or   ondansetron (ZOFRAN) injection 4 mg  4 mg Intravenous Q6H PRN Imagene Sheller S, DO       polyethylene glycol (MIRALAX / GLYCOLAX) packet 17 g  17 g Oral Daily PRN Imagene Sheller S, DO       rosuvastatin (CRESTOR) tablet 10 mg  10 mg Oral Daily Anwar, Shayan S, DO   10 mg at 01/06/21 0935   senna-docusate (Senokot-S) tablet 1 tablet  1 tablet Oral QHS PRN Amin, Jeanella Flattery, MD       traMADol (ULTRAM) tablet 50 mg  50 mg Oral Q6H PRN Croitoru, Mihai, MD   50 mg at 01/05/21 1437   vitamin B-12 (CYANOCOBALAMIN) tablet 100 mcg  100 mcg Oral Daily Heloise Purpura, [facility]  100 mcg at 01/06/21 3406     Discharge Medications: Please see discharge summary for a list of discharge medications.  Relevant Imaging Results:  Relevant Lab Results:   Additional Information SS#: [id number]  Reece Agar, LCSWA

## 2021-01-06 NOTE — Progress Notes (Addendum)
PROGRESS NOTE    Carrie Monroe  HYI:[id number] DOB: 13-Oct-1935 DOA: 01/02/2021 PCP: Ginger Organ., MD    Brief Narrative:  Carrie Monroe was admitted to the hospital with working diagnosis of acute congestive heart failure exacerbation.   85 year old female past medical history for severe arctic stenosis status post TAVR, coronary artery disease, chronic kidney disease, heart failure, breast cancer, carotid artery stenosis, type 2 diabetes mellitus, dyslipidemia and hypertension who presented with severe fatigue and generalized weakness.  Reported several weeks of worsening symptoms, requiring assistance for activities of daily living.  Because of persistent symptoms she was brought to the hospital.  On her initial physical examination blood pressure 154/54, heart rate 54, respiratory rate 19, oxygen saturation 98%, she was ill-looking appearing, her lungs were clear to auscultation bilaterally, heart S1-S2, present, rhythmic, soft abdomen, positive 3+ extremity edema up to the knees.   Na 134, K 3,9, CL 101, bicarb 25, Glucose 115, BUN 45, cr 1,67, wbc 7.6, Hgb 9,1, Hct 28,6, PLT 147. SARS COVID 19 negative.  Urine SG 1,006 with negative nitrates.   Chest film with increase lung marking bilaterally, no infiltrates, positive left breast calcified lesion.  Left shoulder with subtle deformity of the left third rib suspicious for non displaced fracture.  Left shoulder arthrosis. Left knee with arthrosis.   EKG with 56 bpm, left axis deviation, left bundle branch block, poor r wave progression, no ST segment or significant T wave changes.   Patient was placed on aggressive diuresis for volume overload.   For severe knee pain she underwent arthrocentesis with local steroid injection. She continue to be very weak and deconditioned.  Plan for transfer to SNF.     Assessment & Plan:   Active Problems:   Acute on chronic combined systolic and diastolic CHF (congestive heart failure)  (HCC)   Volume overload   Paroxysmal atrial fibrillation (HCC)   Tachycardia-bradycardia syndrome (HCC)   LBBB (left bundle branch block)   Acute on chronic diastolic heart failure decompensation. Severe aortic stenosis. (TAVR 2018). Pulmonary HTN class 2. Acute on chronic core pulmonale  Urine output over last 24 hrs is 300 ml. Oxygenation is 97% on room air. Patient very weak and deconditioned, poor oral intake.  Echocardiogram from 06/22 with LV EF 40 to 45%, LV with global hypokinesis. RV systolic function mildly reduced. RVSP 61. mmHg.  Severe biatrial dilatation.   Continue to hold on diuresis for now, continue blood pressure monitoring.    2. CAD On asa and statin therapy   3. T2DM. Dyslipidemia patient with very poor oral intake, fasting glucose this am is 148 mg/dl.   On rosuvastatin    4. AKI on CKD stage 3a  Renal function with serum cr at 1,84 with K at 4,5 and serum bicarbonate at 26. Continue close follow up on renal function and electrolytes, avoid hypotension or nephrotoxic medications.  Continue to hold on diuretics for now.    5. 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 Plan to follow up with [facility] as outpatient    6. Right knee pain, (pseudogout).  Sp right knee aspiration 14 ml of orange fluid obtained.  40 mg methylprednisolone injection  Fluid positive for calcium pyrophosphate crystals, only 4,935 wbc 93% PMN. Gram stain no organism and culture with no growth.   Continue pain control wit tramadol, off antibiotics, ruled out infection.     7. Pressure ulcer thigh stage 2 and coccyx not able to stage. Local skin care.  8. Iron deficiency anemia. Iron supplementation    9. Chronic atrial fibrillation.  Amiodarone for rate control and apixban for anticoagulation.     Status is: Inpatient  Remains inpatient appropriate because:Inpatient level of care appropriate due to severity of illness  Dispo: The patient is from: Home               Anticipated d/c is to: SNF              Patient currently is medically stable to d/c.   Difficult to place patient No   DVT prophylaxis: Apixaban   Code Status:    full  Family Communication:  I spoke with patient's son at the bedside, we talked in detail about patient's condition, plan of care and prognosis and all questions were addressed.    Nutrition Status: Nutrition Problem: Increased nutrient needs Etiology: chronic illness (CHF) Signs/Symptoms: estimated needs Interventions: Ensure Enlive (each supplement provides 350kcal and 20 grams of protein), MVI     Skin Documentation: Pressure Injury 03/03/20 Sacrum Mid Stage 2 -  Partial thickness loss of dermis presenting as a shallow open injury with a red, pink wound bed without slough. (Active)  03/03/20 0226  Location: Sacrum  Location Orientation: Mid  Staging: Stage 2 -  Partial thickness loss of dermis presenting as a shallow open injury with a red, pink wound bed without slough.  Wound Description (Comments):   Present on Admission: Yes     Pressure Injury 01/03/21 Thigh Posterior;Right Stage 2 -  Partial thickness loss of dermis presenting as a shallow open injury with a red, pink wound bed without slough. (Active)  01/03/21 0048  Location: Thigh  Location Orientation: Posterior;Right  Staging: Stage 2 -  Partial thickness loss of dermis presenting as a shallow open injury with a red, pink wound bed without slough.  Wound Description (Comments):   Present on Admission: Yes     Pressure Injury 01/03/21 Coccyx Medial Unstageable - Full thickness tissue loss in which the base of the injury is covered by slough (yellow, tan, gray, green or brown) and/or eschar (tan, brown or black) in the wound bed. (Active)  01/03/21 0050  Location: Coccyx  Location Orientation: Medial  Staging: Unstageable - Full thickness tissue loss in which the base of the injury is covered by slough (yellow, tan, gray, green or brown) and/or eschar  (tan, brown or black) in the wound bed.  Wound Description (Comments):   Present on Admission: Yes     Consultants:  Cardiology    Subjective: Patient is feeling very weak and deconditioned, she is easy to arouse, follows commands and answers to simple questions   Objective: Vitals:   01/05/21 0930 01/05/21 1137 01/05/21 2035 01/06/21 0313  BP: (!) 127/45 (!) 140/46 (!) 144/53 (!) 146/44  Pulse: (!) 58 (!) 56 73 62  Resp:  17 17 18   Temp: 98.3 F (36.8 C) 97.9 F (36.6 C) 98.6 F (37 C) 97.8 F (36.6 C)  TempSrc: Oral Oral Oral Oral  SpO2: 94% 95% 94% 95%  Weight:    78.9 kg  Height:        Intake/Output Summary (Last 24 hours) at 01/06/2021 1042 Last data filed at 01/06/2021 0342 Gross per 24 hour  Intake 320 ml  Output 300 ml  Net 20 ml   Filed Weights   01/04/21 0648 01/05/21 0000 01/06/21 0313  Weight: 80.3 kg 82.1 kg 78.9 kg    Examination:   General: deconditioned and ill looking  appearing  Neurology: somnolent but easy to arouse, following commands and answering simple questions.   E ENT:  positive pallor, no icterus, oral mucosa moist Cardiovascular: No JVD. S1-S2 present, rhythmic, no gallops, rubs, or murmurs. Trace bilateral non pitting lower extremity edema. Pulmonary: positive breath sounds bilaterally, with no wheezing, rhonchi or rales. Anterior auscultation.  Gastrointestinal. Abdomen soft and non tender Skin. Boots in place  Musculoskeletal: hypertrophic knees bilaterally.      Data Reviewed: I have personally reviewed following labs and imaging studies  CBC: Recent Labs  Lab 01/02/21 1133 01/03/21 0243 01/04/21 0503  WBC 7.6 6.9 7.4  NEUTROABS 6.3  --   --   HGB 9.1* 8.7* 8.0*  HCT 28.6* 26.4* 24.7*  MCV 97.3 94.3 94.6  PLT 147* 135* 938*   Basic Metabolic Panel: Recent Labs  Lab 01/02/21 1133 01/03/21 0243 01/04/21 0503 01/05/21 0419 01/06/21 0215  NA 134* 132* 135 136 132*  K 3.9 3.4* 4.2 4.2 4.5  CL 101 98 102 101 101   CO2 25 26 27 27 26   GLUCOSE 115* 100* 169* 152* 148*  BUN 42* 41* 47* 49* 48*  CREATININE 1.67* 1.62* 1.94* 2.08* 1.84*  CALCIUM 8.9 8.8* 8.6* 9.0 8.8*  MG  --   --  2.0 2.1  --    GFR: Estimated Creatinine Clearance: 22.9 mL/min (A) (by C-G formula based on SCr of 1.84 mg/dL (H)). Liver Function Tests: Recent Labs  Lab 01/02/21 1133 01/03/21 0243  AST 69* 59*  ALT 61* 57*  ALKPHOS 92 92  BILITOT 1.1 0.9  PROT 6.2* 5.7*  ALBUMIN 2.2* 2.0*   No results for input(s): LIPASE, AMYLASE in the last 168 hours. No results for input(s): AMMONIA in the last 168 hours. Coagulation Profile: No results for input(s): INR, PROTIME in the last 168 hours. Cardiac Enzymes: No results for input(s): CKTOTAL, CKMB, CKMBINDEX, TROPONINI in the last 168 hours. BNP (last 3 results) No results for input(s): PROBNP in the last 8760 hours. HbA1C: No results for input(s): HGBA1C in the last 72 hours. CBG: Recent Labs  Lab 01/03/21 0022  GLUCAP 108*   Lipid Profile: No results for input(s): CHOL, HDL, LDLCALC, TRIG, CHOLHDL, LDLDIRECT in the last 72 hours. Thyroid Function Tests: Recent Labs    01/04/21 0503  TSH 1.558  T3FREE 1.3*   Anemia Panel: Recent Labs    01/03/21 1915  VITAMINB12 4,788*  FOLATE 23.0  FERRITIN 280  TIBC 199*  IRON 29  RETICCTPCT 2.3      Radiology Studies: I have reviewed all of the imaging during this hospital visit personally     Scheduled Meds:  amiodarone  200 mg Oral Daily   apixaban  2.5 mg Oral BID   vitamin C  1,000 mg Oral Daily   bupivacaine  10 mL Infiltration Once   cholecalciferol  2,000 Units Oral Daily   colchicine  0.3 mg Oral Daily   feeding supplement  237 mL Oral BID BM   ferrous sulfate  325 mg Oral Q breakfast   Gerhardt's butt cream   Topical BID   methylPREDNISolone acetate  40 mg Intra-articular Once   multivitamin with minerals  1 tablet Oral Daily   rosuvastatin  10 mg Oral Daily   vitamin B-12  100 mcg Oral Daily    Continuous Infusions:   LOS: 4 days        Dorlisa Savino Gerome Apley, MD

## 2021-01-06 NOTE — Progress Notes (Signed)
Progress Note  Patient Name: Carrie Monroe Date of Encounter: 01/06/2021  [facility] Cardiologist: Sanda Klein, MD   Subjective   More alert, less uncomfortable today, but still very weak.  Denies dyspnea or chest discomfort. Renal function parameters improved. Recorded weight has dropped 7 pounds which appears unlikely in/outs were net neutral yesterday.  Inpatient Medications    Scheduled Meds:  amiodarone  200 mg Oral Daily   apixaban  2.5 mg Oral BID   vitamin C  1,000 mg Oral Daily   bupivacaine  10 mL Infiltration Once   cholecalciferol  2,000 Units Oral Daily   colchicine  0.3 mg Oral Daily   feeding supplement  237 mL Oral BID BM   ferrous sulfate  325 mg Oral Q breakfast   Gerhardt's butt cream   Topical BID   methylPREDNISolone acetate  40 mg Intra-articular Once   multivitamin with minerals  1 tablet Oral Daily   rosuvastatin  10 mg Oral Daily   vitamin B-12  100 mcg Oral Daily   Continuous Infusions:  PRN Meds: acetaminophen **OR** acetaminophen, dextromethorphan-guaiFENesin, hydrALAZINE, ipratropium-albuterol, metoprolol tartrate, ondansetron **OR** ondansetron (ZOFRAN) IV, polyethylene glycol, senna-docusate, traMADol   Vital Signs    Vitals:   01/05/21 0930 01/05/21 1137 01/05/21 2035 01/06/21 0313  BP: (!) 127/45 (!) 140/46 (!) 144/53 (!) 146/44  Pulse: (!) 58 (!) 56 73 62  Resp:  _0 Temp: 98.3 F (36.8 C) 97.9 F (36.6 C) 98.6 F (37 C) 97.8 F (36.6 C)  TempSrc: Oral Oral Oral Oral  SpO2: 94% 95% 94% 95%  Weight:    78.9 kg  Height:        Intake/Output Summary (Last 24 hours) at 01/06/2021 0843 Last data filed at 01/06/2021 0342 Gross per 24 hour  Intake 520 ml  Output 300 ml  Net 220 ml   Last 3 Weights 01/06/2021 01/05/2021 01/04/2021  Weight (lbs) 173 lb 15.1 oz 181 lb 177 lb  Weight (kg) 78.9 kg 82.1 kg 80.287 kg      Telemetry    Sinus bradycardia- Personally Reviewed  ECG    Sinus bradycardia left bundle  branch block- Personally Reviewed  Physical Exam  Appears chronically ill, pale, very weak.  Lying fully supine in bed without respiratory difficulty GEN: No acute distress.   Neck: No JVD Cardiac: RRR, 2/6 early peaking aortic ejection murmur, no diastolic murmurs, rubs, or gallops.  Respiratory: Clear to auscultation bilaterally. GI: Soft, nontender, non-distended  MS: No edema; No deformity. Neuro:  Nonfocal  Psych: Normal affect   Labs    High Sensitivity Troponin:  No results for input(s): TROPONINIHS in the last 720 hours.    Chemistry Recent Labs  Lab 01/02/21 1133 01/03/21 0243 01/04/21 0503 01/05/21 0419 01/06/21 0215  NA 134* 132* 135 136 132*  K 3.9 3.4* 4.2 4.2 4.5  CL 101 98 102 101 101  CO2 _1 GLUCOSE 115* 100* 169* 152* 148*  BUN 42* 41* 47* 49* 48*  CREATININE 1.67* 1.62* 1.94* 2.08* 1.84*  CALCIUM 8.9 8.8* 8.6* 9.0 8.8*  PROT 6.2* 5.7*  --   --   --   ALBUMIN 2.2* 2.0*  --   --   --   AST 69* 59*  --   --   --   ALT 61* 57*  --   --   --   ALKPHOS 92 92  --   --   --  BILITOT 1.1 0.9  --   --   --   GFRNONAA 30* 32* 25* 23* 27*  ANIONGAP _0 Hematology Recent Labs  Lab 01/02/21 1133 01/03/21 0243 01/03/21 1915 01/04/21 0503  WBC 7.6 6.9  --  7.4  RBC 2.94* 2.80* 2.79* 2.61*  HGB 9.1* 8.7*  --  8.0*  HCT 28.6* 26.4*  --  24.7*  MCV 97.3 94.3  --  94.6  MCH 31.0 31.1  --  30.7  MCHC 31.8 33.0  --  32.4  RDW 14.6 14.5  --  14.7  PLT 147* 135*  --  135*    BNP Recent Labs  Lab 01/02/21 1209  BNP 661.2*     DDimer No results for input(s): DDIMER in the last 168 hours.   Radiology    No results found.  Cardiac Studies   ECHO: 08/17/2020 2D DIMENSIONS  AORTA          Values     Normal RangeMAIN PA      Values     Normal Range       Annulus:  nm*  cm    [1.9 - 2.7]    PA Main:  nm*  cm    [1.5 - 2.1]     Aorta Sin:  nm*  cm    [2.4 - 3.6] RIGHT VENTRICLE   ST Junction:  nm*  cm    [2 - 3.2]      RV Base:    4.3 cm    [2.5 - 4.1]     Asc.Aorta:   3.2 cm    [1.9 - 3.5]     RV Mid:   2.6 cm    [1.9 - 3.5]  LEFT VENTRICLE                         RV Length:  nm*  cm    [  ]         LVIDd:   5.3 cm    [3.7 - 5.3] RIGHT ATRIUM         LVIDs:   4.1 cm    [2.2 - 3.4]    RA Area:  25   cm2   [ <= 20]        LVEDVi:  nm*  ml/m2 [29 - 61]         RAVi:  47   ml/m2 [15 - 27]        LVESVi:  nm*  ml/m2 [8 - 24]    INFERIOR VENA CAVA            FS:  23   %     [ >= 25]       Max.IVC:   1.7 cm    [ <= 2.1]           SWT:   1.3 cm    [0.6 - 0.9]    Min.IVC:  nm*  cm    [ <= 1.7]           PWT:   1.4 cm    [0.6 - 0.9] __________________  LEFT ATRIUM                           nm* - not measured       LA Diam:   4.5 cm    [2.7 - 3.8]  LA Area:  27   cm2   [ <= 20]     LA Volume:  97   ml    [22 - 52]          LAVi:  50   ml/m2 [16 - 34]   ECHOCARDIOGRAPHIC DESCRIPTIONS -----------------------------------------------  AORTIC ROOT          Size: Normal    Dissection: INDETERM FOR DISSECTION   AORTIC VALVE      Leaflets: PROSTHETIC            Morphology: Not seen      Mobility: Not seen   LEFT VENTRICLE                                      Anterior: HYPOCONTRACTILE          Size: Normal                                 Lateral: HYPOCONTRACTILE   Contraction: MOD GLOBAL DECREASE                     Septal: HYPOCONTRACTILE    Closest EF: 40% (Estimated)  Calc.EF: 46% (2D)      Apical: HYPOCONTRACTILE     LV masses: No Masses                             Inferior: HYPOCONTRACTILE           LVH: MILD LVH CONCENTRIC                  Posterior: HYPOCONTRACTILE   LV GLS(GE): -12.2% Normal Range [ <= -16]  Dias.FxClass: N/A   MITRAL VALVE      Leaflets: ABNORMAL                Mobility: Fully mobile    Morphology: THICKENED LEAFLET(S)   LEFT ATRIUM          Size: SEVERELY ENLARGED     LA masses: No masses                Normal IAS   MAIN PA          Size: Not seen   PULMONIC VALVE    Morphology:  Normal      Mobility: Fully Mobile   RIGHT VENTRICLE          Size: Normal                    Free wall: HYPOCONTRACTILE   Contraction: MILD GLOBAL DECREASE      RV masses: No Masses         TAPSE:   1.8 cm,  Normal Range [>= 1.6 cm]       RV Note: TV S' 0.8m/s   TRICUSPID VALVE      Leaflets: Normal                  Mobility: Fully mobile    Morphology: Normal   RIGHT ATRIUM          Size: MODERATELY ENLARGED        RA Other: None     RA masses: No masses   PERICARDIUM         Fluid: No effusion  INFERIOR VENACAVA          Size: Normal     Normal respiratory collapse   DOPPLER ECHO and OTHER SPECIAL PROCEDURES ------------------------------------     Aortic: No AR                  TAVR      2.2 m/s peak vel   19 mmHg peak grad  10 mmHg mean grad 1.2 cm2 by DOPPLER   LVOT Diam: 1.7 cm. Dimensionless Index: 0.55      Mitral: MODERATE MR            No MS     MV Inflow E Vel.= 104.0 cm/s  MV Annulus E'Vel.= 6.0 cm/s  E/E'Ratio= 17   Tricuspid: MILD TR                No TS             3.4 m/s peak TR vel   50 mmHg peak RV pressure   Pulmonary: MODERATE PR            No PS     CARDIAC MONITOR 08/12/2020 Impression   1. Predominantly sinus mechanism rhythms and 4 occurrences of supraventricular tachycardia, the longest lasting 12 beats. (0.17% PAC burden) 2. No ventricular ectopy noted 3. Patient triggered events notable for artifact/lead loss preceded by sinus  bradycardia 4. Automatically detected events include supraventricular ectopy(PACs) and SVT   Narrative   This result has an attachment that is not available.  *The observed rhythms are sinus bradycardia to sinus rhythm. *The Maximum Heart Rate recorded was 120 bpm, Day 3 / 12:54:10 pm, the Minimum Heart Rate recorded was 35 bpm, Day 6 / 12:05:18 pm and the Average Heart Rate was 54 bpm. *There were 910 PSVCs  with a burden of 0.17 %. There were 4 occurrences of Supraventricular Tachycardia with the longest episode 12  beats, Day 2 / 11:33:15 am and the fastest episode 120 bpm, Day 3 / 12:54:14 pm. *There was1 Patient triggeredevents.   CARDIAC CATH/PCI, 12/28/2016 A STENT SYNERGY DES 3X16 drug eluting stent was successfully placed. Mid Cx lesion, 99 %stenosed. Post intervention, there is a 0% residual stenosis. A STENT SYNERGY DES 2.5X28 drug eluting stent was successfully placed. Mid LAD lesion, 90 %stenosed. Post intervention, there is a 0% residual stenosis. Ost 2nd Diag lesion, 70 %stenosed. Post intervention, there is a 60% residual stenosis.   1. Severe stenosis mid LAD at bifurcation of the Diagonal. Successful PTCA/DES x 1 mid LAD. Successful PTCA with balloon angioplasty only ostium Diagonal. 2. Severe stenosis mid Circumflex. Successful PTCA/DES x 1 mid to distal Circumflex.   Recommendations. Will continue ASA and Plavix for at least 3 months. Follow renal function and H/H in am. I will plan medical management of the moderate stenosis in the mid RCA. I will discuss f/u with Dr. Roxy Manns for second surgical consult. She will need staged CT scans in preparation for TAVR based on renal function. She will have TAVR then will have carotid endarterectomy when all cardiac issues are stable in several months. Diagnostic Dominance: Right     Intervention        Patient Profile     85 y.o. 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 Not dyspneic at rest. Most recent LVEF 40-45% with predominantly global hypokinesis pattern.  Medical therapy limited by dizziness/bradycardia/hypotension.  Has an extremely broad LBBB  and may benefit from CRT-P, but she appears firmly against any invasive/interventional procedures.  Appears to be euvolemic today, but "dry weight" remains a little uncertain due to the broad variation and weights recorded in the last couple of days (174/180 pounds). Conduction system disease/drug-induced bradycardia: She has severe persistent sinus bradycardia due to amiodarone therapy and advanced age related conduction system disease.  The recent monitor showed that her minimum heart rate in sinus bradycardia was 35 bpm and her average heart rate was 54 bpm.  Some (but not all) of her fatigue can readily be explained by chronotropic incompetence.  She would benefit from pacemaker therapy, specifically CRT-P with rate response.  This was proposed by her cardiologist at [facility], as well. The patient continues to firmly but declines any further invasive procedures. CAD: No angina.  4 years have passed since her revascularization procedure, which was prompted by the need for TAVR, rather than symptomatic CAD.  Cardiomyopathy is felt to be primarily nonischemic.  On statin.  Most recent LDL 52. Afib: Previous atrial fibrillation was extremely deleterious.  Had persistent atrial fibrillation rapid ventricular response with difficulty in rate control due to hypotension with conventional AV blocking agents.  Developed tachycardia related cardiomyopathy with EF down to 20%, failed initial cardioversion, but succeeded on amiodarone and LVEF recovered to 40-45% after 3 months in sinus rhythm .  On anticoagulation with Eliquis.  No overt bleeding. Amiodarone: Started after she failed cardioversion and has helped maintain sinus rhythm for over 6 months now. Acute on CKD 3b: Creatinine has improved by holding diuretics.  Resume furosemide when creatinine back to her baseline level (which appears to be 1.5-1.6, corresponding to a GFR around 30-35).  Some residual edema would not be surprising with severe hypoalbuminemia.   Anemia: Hyporegenerative, normocytic normochromic.  Contributing to fatigue and weakness.  No evidence of iron deficiency or other nutritional cause.  Could be related to CKD +/-anemia of chronic disease. Probable pseudogout: arthrocentesis results, elevated ESR/CRP strongly support this. Shoulder symptoms are probably related to a chronic rotator cuff tear. Started colchicine on 01/05/2021, dose adjusted for renal function. May also consider a brief course of systemic steroids. Avoid NSAIDs due to chronic anticoagulation, chronic kidney disease and heart failure. Severe  Deconditioning: Prognosis is not good.  May not be the best candidate for invasive/aggressive therapy and palliative care may need to be considered.  I do not think her family is ready for that decision at this time though.  Plan is for skilled nursing facility for rehab.  We will check a CK to make sure she is not having myopathy. Oversedation: Has an excessive sedative response to treatment with narcotics.  Often takes her 48 hours to recover from a single dose of hydrocodone or oxycodone.  Opiates should be avoided.  She does okay with Ultram.       For questions or updates, please contact [place] Please consult [email or web address] for contact info under        Signed, Sanda Klein, MD  01/06/2021, 8:43 AM

## 2021-01-06 NOTE — Consult Note (Signed)
   [facility] CM Inpatient Consult   01/06/2021  Carrie Monroe [birth year] [id number]  [place] [facility] [ACO] Patient: [place] [employer]  Primary Care Provider:  Ginger Organ., MD is listed as the primary care  Patient screened for hospitalization with noted high risk score for unplanned readmission risk and  to assess for potential [facility] service needs for post hospital transition.  Review of patient's medical record reveals patient is being recommended for a skilled nursing facility for rehab.    Plan:  Continue to follow progress and disposition to assess for post hospital care management needs.  If patient transitions to a skilled nursing facility level of care then her post hospital needs for transition are to be met at that level of care.  For questions contact:   Natividad Brood, RN BSN [facility] Liaison  [phone] business mobile phone Toll free office [phone]  Fax number: [phone] [email or web address]

## 2021-01-06 NOTE — TOC Progression Note (Addendum)
Transition of Care [facility]) - Progression Note    Patient Details  Name: Carrie Monroe MRN: [id number] Date of Birth: 08/21/1935  [facility] CM/SW Contact  Reece Agar, [place] Phone Number: 01/06/2021, 4:16 PM  Clinical Narrative:    CSW spoke with the pt son and his wife who are set on [place]. Pt son was visibly upset about pt declining and wants her in a SNF asap. CSW confirmed that pt could not go to [place] with coordinator who states she has shared that the pt could NOT go to [place].   The family's next choice is [facility] in which CSW reached out to [place], she is reviewing pt information. CSW started auth with ref# [id number] without facility.  [facility] cannot accept pt bc she is not vaccinated and they have no LTC beds for after pt has completed rehab.  CSW followed up with pt daughter in law and informed her that [facility] could not accept pt bc of non vaccination status. Daughter in law asked if CSW could check with [facility], [place] reached out to [facility] for bed availability.Csw will continue to follow.  [facility] does not have any non vaccinated beds at this time. CSW informed pt DIL. CSW will continue to follow pt for DC needs.  Expected Discharge Plan: [place] Barriers to Discharge: Continued Medical Work up  Expected Discharge Plan and Services Expected Discharge Plan: [place] In-house Referral: Clinical Social Work Discharge Planning Services: NA Post Acute Care Choice: [place] Living arrangements for the past 2 months: [place]                                       Social Determinants of Health (SDOH) Interventions    Readmission Risk Interventions Readmission Risk Prevention Plan 03/17/2020  Transportation Screening Complete  PCP or Specialist Appt within 3-5 Days Complete  HRI or [place] Complete  Social Work Consult for [place] Planning/Counseling Complete   Palliative Care Screening Not Applicable  Medication Review Press photographer) Complete  Some recent data might be hidden

## 2021-01-07 LAB — BODY FLUID CULTURE W GRAM STAIN: Culture: NO GROWTH

## 2021-01-07 NOTE — Plan of Care (Signed)

## 2021-01-07 NOTE — Plan of Care (Addendum)
Upon assessment, patient is grimacing and minimally interactive - indicating she is in pain. Patient denies any pain and declines any medication for pain, but is obviously uncomfortable. Patient declines reposition. Except to place her left elbow up on a pillow. Patient is tearful but denies any needs or concerns at this time.  Edit: patient reluctantly agreed to allow staff to change wound dressing and reposition her hips @ 2215 hrs.  Problem: Education: Goal: Knowledge of General Education information will improve Description: Including pain rating scale, medication(s)/side effects and non-pharmacologic comfort measures Outcome: Not Progressing   Problem: Health Behavior/Discharge Planning: Goal: Ability to manage health-related needs will improve Outcome: Not Progressing   Problem: Clinical Measurements: Goal: Ability to maintain clinical measurements within normal limits will improve Outcome: Not Progressing Goal: Will remain free from infection Outcome: Not Progressing Goal: Diagnostic test results will improve Outcome: Not Progressing Goal: Respiratory complications will improve Outcome: Not Progressing Goal: Cardiovascular complication will be avoided Outcome: Not Progressing   Problem: Activity: Goal: Risk for activity intolerance will decrease Outcome: Not Progressing   Problem: Nutrition: Goal: Adequate nutrition will be maintained Outcome: Not Progressing   Problem: Coping: Goal: Level of anxiety will decrease Outcome: Not Progressing   Problem: Elimination: Goal: Will not experience complications related to bowel motility Outcome: Not Progressing Goal: Will not experience complications related to urinary retention Outcome: Not Progressing   Problem: Pain Managment: Goal: General experience of comfort will improve Outcome: Not Progressing   Problem: Safety: Goal: Ability to remain free from injury will improve Outcome: Not Progressing   Problem: Skin  Integrity: Goal: Risk for impaired skin integrity will decrease Outcome: Not Progressing

## 2021-01-07 NOTE — Progress Notes (Signed)
PROGRESS NOTE    Carrie Monroe  FIE:[id number] DOB: 1935-07-23 DOA: 01/02/2021 PCP: Ginger Organ., MD    Brief Narrative:  Carrie Monroe was admitted to the hospital with working diagnosis of acute congestive heart failure exacerbation.   85 year old female past medical history for severe arctic stenosis status post TAVR, coronary artery disease, chronic kidney disease, heart failure, breast cancer, carotid artery stenosis, type 2 diabetes mellitus, dyslipidemia and hypertension who presented with severe fatigue and generalized weakness.  Reported several weeks of worsening symptoms, requiring assistance for activities of daily living.  Because of persistent symptoms she was brought to the hospital.  On her initial physical examination blood pressure 154/54, heart rate 54, respiratory rate 19, oxygen saturation 98%, she was ill-looking appearing, her lungs were clear to auscultation bilaterally, heart S1-S2, present, rhythmic, soft abdomen, positive 3+ extremity edema up to the knees.   Na 134, K 3,9, CL 101, bicarb 25, Glucose 115, BUN 45, cr 1,67, wbc 7.6, Hgb 9,1, Hct 28,6, PLT 147. SARS COVID 19 negative.   Urine SG 1,006 with negative nitrates.    Chest film with increase lung marking bilaterally, no infiltrates, positive left breast calcified lesion.  Left shoulder with subtle deformity of the left third rib suspicious for non displaced fracture.  Left shoulder arthrosis. Left knee with arthrosis.    EKG with 56 bpm, left axis deviation, left bundle branch block, sinus rhythm, with poor r wave progression, no ST segment or significant T wave changes.    Patient was placed on aggressive diuresis for volume overload.   For severe knee pain she underwent arthrocentesis with local steroid injection. She continue to be very weak and deconditioned.  Plan for transfer to SNF.    Assessment & Plan:   Active Problems:   Acute on chronic combined systolic and diastolic CHF  (congestive heart failure) (HCC)   Volume overload   Paroxysmal atrial fibrillation (HCC)   Tachycardia-bradycardia syndrome (HCC)   LBBB (left bundle branch block)       Acute on chronic diastolic heart failure decompensation. Severe aortic stenosis. (TAVR 2018). Pulmonary HTN class 2. Acute on chronic core pulmonale  Urine output over last 24 hrs is 650 ml. Oxygenation is 95% on room air.  Patient is more awake today and having better po intake but continue very weak and deconditioned.   Echocardiogram from 06/22 with LV EF 40 to 45%, LV with global hypokinesis. RV systolic function mildly reduced. RVSP 61. mmHg.  Severe biatrial dilatation.    Continue blood pressure monitoring Plan to resume diuresis when recovery of renal function    2. CAD Continue with asa and statin therapy   3. T2DM. Dyslipidemia  Continue glucose cover and monitoring with insulin sliding scale, today her po intake is better.  On rosuvastatin    4. AKI on CKD stage 3a  Follow up renal function in am, continue to hold on diuretics for now. Avoid hypotension or nephrotoxic medications.    5. 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 Plan to follow up with [facility] as outpatient    6. Right knee pain, (pseudogout).  Sp right knee aspiration 14 ml of orange fluid obtained.  40 mg methylprednisolone injection  Fluid positive for calcium pyrophosphate crystals, only 4,935 wbc 93% PMN. Gram stain no organism and culture with no growth.   As needed tramadol, off antibiotics, ruled out infection.   Continue with topical diclofenac to tender points.    7. Pressure ulcer  thigh stage 2 and coccyx not able to stage. Continue with local skin care.    8. Iron deficiency anemia. Continue with iron supplementation    9. Chronic atrial fibrillation.  On amiodarone for rate control and apixban for anticoagulation.   Heart rate has remained in the 60 range    Status is: Inpatient  Remains inpatient  appropriate because:Inpatient level of care appropriate due to severity of illness  Dispo: The patient is from: Home              Anticipated d/c is to: SNF              Patient currently is medically stable to d/c.   Difficult to place patient No   DVT prophylaxis: Apixaban   Code Status:   full  Family Communication:   I spoke with patient's son at the bedside, we talked in detail about patient's condition, plan of care and prognosis and all questions were addressed.      Nutrition Status: Nutrition Problem: Increased nutrient needs Etiology: chronic illness (CHF) Signs/Symptoms: estimated needs Interventions: Ensure Enlive (each supplement provides 350kcal and 20 grams of protein), MVI     Skin Documentation: Pressure Injury 03/03/20 Sacrum Mid Stage 2 -  Partial thickness loss of dermis presenting as a shallow open injury with a red, pink wound bed without slough. (Active)  03/03/20 0226  Location: Sacrum  Location Orientation: Mid  Staging: Stage 2 -  Partial thickness loss of dermis presenting as a shallow open injury with a red, pink wound bed without slough.  Wound Description (Comments):   Present on Admission: Yes     Pressure Injury 01/03/21 Thigh Posterior;Right Stage 2 -  Partial thickness loss of dermis presenting as a shallow open injury with a red, pink wound bed without slough. (Active)  01/03/21 0048  Location: Thigh  Location Orientation: Posterior;Right  Staging: Stage 2 -  Partial thickness loss of dermis presenting as a shallow open injury with a red, pink wound bed without slough.  Wound Description (Comments):   Present on Admission: Yes     Pressure Injury 01/03/21 Coccyx Medial Unstageable - Full thickness tissue loss in which the base of the injury is covered by slough (yellow, tan, gray, green or brown) and/or eschar (tan, brown or black) in the wound bed. (Active)  01/03/21 0050  Location: Coccyx  Location Orientation: Medial  Staging:  Unstageable - Full thickness tissue loss in which the base of the injury is covered by slough (yellow, tan, gray, green or brown) and/or eschar (tan, brown or black) in the wound bed.  Wound Description (Comments):   Present on Admission: Yes     Consultants:  Cardiology     Subjective: Patient continue to be very weak and deconditioned, this am is more awake and having better po intake, no nausea or vomiting, no chest pain.   Objective: Vitals:   01/06/21 1955 01/07/21 0303 01/07/21 0304 01/07/21 1129  BP: (!) 141/52 (!) 134/46  (!) 131/42  Pulse: (!) 57 (!) 59  (!) 57  Resp: 16 16  18   Temp: 98.6 F (37 C) 98.9 F (37.2 C)  98.1 F (36.7 C)  TempSrc: Oral Oral  Oral  SpO2: 97% 95%  95%  Weight:   78.9 kg   Height:        Intake/Output Summary (Last 24 hours) at 01/07/2021 1150 Last data filed at 01/07/2021 0931 Gross per 24 hour  Intake 355 ml  Output 650 ml  Net -295 ml   Filed Weights   01/05/21 0000 01/06/21 0313 01/07/21 0304  Weight: 82.1 kg 78.9 kg 78.9 kg    Examination:   General: Not in pain or dyspnea, deconditioned  Neurology: Awake and alert, non focal  E ENT: no pallor, no icterus, oral mucosa moist Cardiovascular: No JVD. S1-S2 present, rhythmic, no gallops, rubs, or murmurs. Trace lower extremity edema. Pulmonary: positive breath sounds bilaterally, with no wheezing, rhonchi or rales. Gastrointestinal. Abdomen soft and non tender Skin. No rashes, bilateral feet boots.  Musculoskeletal: no joint deformities     Data Reviewed: I have personally reviewed following labs and imaging studies  CBC: Recent Labs  Lab 01/02/21 1133 01/03/21 0243 01/04/21 0503  WBC 7.6 6.9 7.4  NEUTROABS 6.3  --   --   HGB 9.1* 8.7* 8.0*  HCT 28.6* 26.4* 24.7*  MCV 97.3 94.3 94.6  PLT 147* 135* 660*   Basic Metabolic Panel: Recent Labs  Lab 01/02/21 1133 01/03/21 0243 01/04/21 0503 01/05/21 0419 01/06/21 0215  NA 134* 132* 135 136 132*  K 3.9 3.4*  4.2 4.2 4.5  CL 101 98 102 101 101  CO2 25 26 27 27 26   GLUCOSE 115* 100* 169* 152* 148*  BUN 42* 41* 47* 49* 48*  CREATININE 1.67* 1.62* 1.94* 2.08* 1.84*  CALCIUM 8.9 8.8* 8.6* 9.0 8.8*  MG  --   --  2.0 2.1  --    GFR: Estimated Creatinine Clearance: 22.9 mL/min (A) (by C-G formula based on SCr of 1.84 mg/dL (H)). Liver Function Tests: Recent Labs  Lab 01/02/21 1133 01/03/21 0243  AST 69* 59*  ALT 61* 57*  ALKPHOS 92 92  BILITOT 1.1 0.9  PROT 6.2* 5.7*  ALBUMIN 2.2* 2.0*   No results for input(s): LIPASE, AMYLASE in the last 168 hours. No results for input(s): AMMONIA in the last 168 hours. Coagulation Profile: No results for input(s): INR, PROTIME in the last 168 hours. Cardiac Enzymes: Recent Labs  Lab 01/06/21 0215  CKTOTAL 207   BNP (last 3 results) No results for input(s): PROBNP in the last 8760 hours. HbA1C: No results for input(s): HGBA1C in the last 72 hours. CBG: Recent Labs  Lab 01/03/21 0022 01/06/21 1155 01/06/21 1621 01/06/21 2112  GLUCAP 108* 150* 148* 161*   Lipid Profile: No results for input(s): CHOL, HDL, LDLCALC, TRIG, CHOLHDL, LDLDIRECT in the last 72 hours. Thyroid Function Tests: No results for input(s): TSH, T4TOTAL, FREET4, T3FREE, THYROIDAB in the last 72 hours. Anemia Panel: No results for input(s): VITAMINB12, FOLATE, FERRITIN, TIBC, IRON, RETICCTPCT in the last 72 hours.    Radiology Studies: I have reviewed all of the imaging during this hospital visit personally     Scheduled Meds:  amiodarone  200 mg Oral Daily   apixaban  2.5 mg Oral BID   vitamin C  1,000 mg Oral Daily   bupivacaine  10 mL Infiltration Once   cholecalciferol  2,000 Units Oral Daily   colchicine  0.3 mg Oral Daily   diclofenac Sodium  2 g Topical QID   feeding supplement  237 mL Oral BID BM   ferrous sulfate  325 mg Oral Q breakfast   Gerhardt's butt cream   Topical BID   methylPREDNISolone acetate  40 mg Intra-articular Once   multivitamin  with minerals  1 tablet Oral Daily   rosuvastatin  10 mg Oral Daily   vitamin B-12  100 mcg Oral Daily   Continuous Infusions:   LOS: 5  days        Laurella Tull Gerome Apley, MD

## 2021-01-07 NOTE — TOC Progression Note (Signed)
Transition of Care [facility]) - Progression Note    Patient Details  Name: Carrie Monroe MRN: [id number] Date of Birth: [birth year]  Transition of Care [facility]) CM/SW [place], [place] Phone Number: [phone] 01/07/2021, 1:30 PM  Clinical Narrative:     CSW met with pt and pt's son to discuss the bed offer from [facility]. Pt's son notified CSW to follow up with daughter in law [phone]. CSW spoke with [name] and discussed the bed offer from [facility]. [name] inquired about [facility] as well. [name] stated that [facility] could be option. CSW attempted to follow up with [facility] and [facility] however had to leave messages.   TOC team will continue to assist with discharge planning needs.   Expected Discharge Plan: [place] Barriers to Discharge: Continued Medical Work up  Expected Discharge Plan and Services Expected Discharge Plan: [place] In-house Referral: Clinical Social Work Discharge Planning Services: NA Post Acute Care Choice: [place] Living arrangements for the past 2 months: [place]                                       Social Determinants of Health (SDOH) Interventions    Readmission Risk Interventions Readmission Risk Prevention Plan 03/17/2020  Transportation Screening Complete  PCP or Specialist Appt within 3-5 Days Complete  HRI or [place] Complete  Social Work Consult for [place] Planning/Counseling Complete  Palliative Care Screening Not Applicable  Medication Review Press photographer) Complete  Some recent data might be hidden

## 2021-01-07 NOTE — Progress Notes (Signed)
Progress Note  Patient Name: Carrie Monroe Date of Encounter: 01/07/2021  [facility] Cardiologist: Sanda Klein, MD   Subjective   No CP; dyspnea improving.  Inpatient Medications    Scheduled Meds:  amiodarone  200 mg Oral Daily   apixaban  2.5 mg Oral BID   vitamin C  1,000 mg Oral Daily   bupivacaine  10 mL Infiltration Once   cholecalciferol  2,000 Units Oral Daily   colchicine  0.3 mg Oral Daily   diclofenac Sodium  2 g Topical QID   feeding supplement  237 mL Oral BID BM   ferrous sulfate  325 mg Oral Q breakfast   Gerhardt's butt cream   Topical BID   methylPREDNISolone acetate  40 mg Intra-articular Once   multivitamin with minerals  1 tablet Oral Daily   rosuvastatin  10 mg Oral Daily   vitamin B-12  100 mcg Oral Daily   Continuous Infusions:  PRN Meds: acetaminophen **OR** acetaminophen, ipratropium-albuterol, metoprolol tartrate, ondansetron **OR** ondansetron (ZOFRAN) IV, polyethylene glycol, senna-docusate, traMADol   Vital Signs    Vitals:   01/06/21 1154 01/06/21 1955 01/07/21 0303 01/07/21 0304  BP: (!) 129/49 (!) 141/52 (!) 134/46   Pulse: 60 (!) 57 (!) 59   Resp: 18 16 16    Temp: 98.8 F (37.1 C) 98.6 F (37 C) 98.9 F (37.2 C)   TempSrc: Oral Oral Oral   SpO2: 97% 97% 95%   Weight:    78.9 kg  Height:        Intake/Output Summary (Last 24 hours) at 01/07/2021 1111 Last data filed at 01/07/2021 0931 Gross per 24 hour  Intake 355 ml  Output 650 ml  Net -295 ml   Last 3 Weights 01/07/2021 01/06/2021 01/05/2021  Weight (lbs) 173 lb 15.1 oz 173 lb 15.1 oz 181 lb  Weight (kg) 78.9 kg 78.9 kg 82.1 kg      Telemetry    Sinus bradycardia - Personally Reviewed   Physical Exam   GEN: No acute distress.  Chronically ill appearing Neck: No JVD Cardiac: regular and bradycardic Respiratory: Clear to auscultation bilaterally. GI: Soft, nontender, non-distended  MS: No edema Neuro:  Nonfocal  Psych: Normal affect   Labs     Chemistry Recent Labs  Lab 01/02/21 1133 01/03/21 0243 01/04/21 0503 01/05/21 0419 01/06/21 0215  NA 134* 132* 135 136 132*  K 3.9 3.4* 4.2 4.2 4.5  CL 101 98 102 101 101  CO2 25 26 27 27 26   GLUCOSE 115* 100* 169* 152* 148*  BUN 42* 41* 47* 49* 48*  CREATININE 1.67* 1.62* 1.94* 2.08* 1.84*  CALCIUM 8.9 8.8* 8.6* 9.0 8.8*  PROT 6.2* 5.7*  --   --   --   ALBUMIN 2.2* 2.0*  --   --   --   AST 69* 59*  --   --   --   ALT 61* 57*  --   --   --   ALKPHOS 92 92  --   --   --   BILITOT 1.1 0.9  --   --   --   GFRNONAA 30* 32* 25* 23* 27*  ANIONGAP 8 8 6 8 5      Hematology Recent Labs  Lab 01/02/21 1133 01/03/21 0243 01/03/21 1915 01/04/21 0503  WBC 7.6 6.9  --  7.4  RBC 2.94* 2.80* 2.79* 2.61*  HGB 9.1* 8.7*  --  8.0*  HCT 28.6* 26.4*  --  24.7*  MCV 97.3  94.3  --  94.6  MCH 31.0 31.1  --  30.7  MCHC 31.8 33.0  --  32.4  RDW 14.6 14.5  --  14.7  PLT 147* 135*  --  135*    BNP Recent Labs  Lab 01/02/21 1209  BNP 661.2*      Patient Profile     85 y.o. female with past medical history of coronary artery disease, previous TAVR, persistent atrial fibrillation, chronic combined systolic/diastolic congestive heart failure, diabetes mellitus, hypertension, hyperlipidemia, left bundle branch block, chronic stage IIIb kidney disease with acute on chronic combined systolic/diastolic congestive heart failure, weakness and fatigue.  Most recent echocardiogram June 2021 showed ejection fraction 40 to 45%, mild left ventricular hypertrophy, mild right ventricular enlargement, mild RV dysfunction, severe biatrial enlargement, mild to moderate mitral regurgitation, status post TAVR with mean gradient 3 mmHg and no AI.  Assessment & Plan    1 acute on chronic combined systolic/diastolic congestive heart failure-[id number] since admission. Wt  78.9 kg.  Patient remains euvolemic on examination.  Diuretics on hold due to worsening renal function.  As outlined by Dr. Sallyanne Kuster would  resume home dose of Lasix when creatinine 1.5-1.6.  2 history of bradycardia-felt possibly contributing to her fatigue.  Pacemaker is felt indicated but patient declined previously.  She now states she would consider.  This can be discussed with Dr. Sallyanne Kuster as an outpatient.  3 paroxysmal atrial fibrillation-she remains in sinus rhythm.  She previously had rates difficult to control due to hypotension associated with conventional AV nodal blocking agents.  We will continue amiodarone and apixaban at present dose.  4 acute on chronic stage IIIb kidney disease-renal function slightly improved.  As outlined above resume Lasix when creatinine 1.5-1.6.  5 history of TAVR  6 coronary artery disease-continue statin.  No aspirin given need for apixaban.  Other issues per primary care.  Apparently may need placement.  Cardiology will sign off.  We will arrange follow-up approximately 8 to 12 weeks following discharge.  For questions or updates, please contact [place] Please consult [email or web address] for contact info under        Signed, Kirk Ruths, MD  01/07/2021, 11:11 AM

## 2021-01-08 LAB — BASIC METABOLIC PANEL
Anion gap: 6 (ref 5–15)
BUN: 54 mg/dL — ABNORMAL HIGH (ref 8–23)
CO2: 26 mmol/L (ref 22–32)
Calcium: 9.1 mg/dL (ref 8.9–10.3)
Chloride: 103 mmol/L (ref 98–111)
Creatinine, Ser: 1.59 mg/dL — ABNORMAL HIGH (ref 0.44–1.00)
GFR, Estimated: 32 mL/min — ABNORMAL LOW (ref >60)
Glucose, Bld: 139 mg/dL — ABNORMAL HIGH (ref 70–99)
Potassium: 4.7 mmol/L (ref 3.5–5.1)
Sodium: 135 mmol/L (ref 135–145)

## 2021-01-08 MED ORDER — TRAMADOL HCL 50 MG PO TABS
25.0000 mg | ORAL_TABLET | Freq: Four times a day (QID) | ORAL | Status: DC | PRN
  Administered 2021-01-08 – 2021-01-09 (×2): 25 mg via ORAL
  Filled 2021-01-08 (×2): qty 1

## 2021-01-08 NOTE — Progress Notes (Signed)
PROGRESS NOTE    MEYER DOCKERY  IHK:[id number] DOB: 1936/03/05 DOA: 01/02/2021 PCP: Ginger Organ., MD    Brief Narrative:  Mrs. Coyt was admitted to the hospital with working diagnosis of acute congestive heart failure exacerbation.   85 year old female past medical history for severe arctic stenosis status post TAVR, coronary artery disease, chronic kidney disease, heart failure, breast cancer, carotid artery stenosis, type 2 diabetes mellitus, dyslipidemia and hypertension who presented with severe fatigue and generalized weakness.  Reported several weeks of worsening symptoms, requiring assistance for activities of daily living.  Because of persistent symptoms she was brought to the hospital.  On her initial physical examination blood pressure 154/54, heart rate 54, respiratory rate 19, oxygen saturation 98%, she was ill-looking appearing, her lungs were clear to auscultation bilaterally, heart S1-S2, present, rhythmic, soft abdomen, positive 3+ extremity edema up to the knees.   Na 134, K 3,9, CL 101, bicarb 25, Glucose 115, BUN 45, cr 1,67, wbc 7.6, Hgb 9,1, Hct 28,6, PLT 147. SARS COVID 19 negative.   Urine SG 1,006 with negative nitrates.    Chest film with increase lung marking bilaterally, no infiltrates, positive left breast calcified lesion.  Left shoulder with subtle deformity of the left third rib suspicious for non displaced fracture.  Left shoulder arthrosis. Left knee with arthrosis.    EKG with 56 bpm, left axis deviation, left bundle branch block, sinus rhythm, with poor r wave progression, no ST segment or significant T wave changes.    Patient was placed on aggressive diuresis for volume overload.   For severe knee pain she underwent arthrocentesis with local steroid injection. She continue to be very weak and deconditioned.  Plan for transfer to SNF.    Assessment & Plan:   Active Problems:   Acute on chronic combined systolic and diastolic CHF  (congestive heart failure) (HCC)   Volume overload   Paroxysmal atrial fibrillation (HCC)   Tachycardia-bradycardia syndrome (HCC)   LBBB (left bundle branch block)   Acute on chronic diastolic heart failure decompensation. Severe aortic stenosis. (TAVR 2018). Pulmonary HTN class 2. Acute on chronic core pulmonale  Urine output over last 24 hrs is 1000 ml.  Continue to be very weak and deconditioned  Echocardiogram from 06/22 with LV EF 40 to 45%, LV with global hypokinesis. RV systolic function mildly reduced. RVSP 61. mmHg.  Severe biatrial dilatation.   Clinically euvolemic.    2. CAD On asa and statin therapy   3. T2DM. Dyslipidemia  Glucose cover and monitoring with insulin sliding scale, today her po intake is better.  Continue with rosuvastatin    4. AKI on CKD stage 3a  Stable renal function with serum cr down to 1,59 with K at 4,7 and serum bicarbnate at 26. Plan to resume diuresis at discharge.    5. 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 Plan to follow up with [facility] as outpatient    6. Right knee pain, (pseudogout).  Sp right knee aspiration 14 ml of orange fluid obtained.  40 mg methylprednisolone injection  Fluid positive for calcium pyrophosphate crystals, only 4,935 wbc 93% PMN. Gram stain no organism and culture with no growth. Ruled out septic arthritis.    Continue with as needed tramadol, will decrease dose to 25 mg as needed to prevent sedation.  Topical diclofenac to tender points.    7. Pressure ulcer thigh stage 2 and coccyx not able to stage. Local skin care.    8. Iron  deficiency anemia. Iron supplementation    9. Chronic atrial fibrillation.  continue with amiodarone for rate control and apixban for anticoagulation.   HR has been in the 52 to 58. History of tachy-brady syndrome.   Status is: Inpatient  Remains inpatient appropriate because:Inpatient level of care appropriate due to severity of illness  Dispo: The patient is from:  Home              Anticipated d/c is to: SNF              Patient currently is medically stable to d/c.   Difficult to place patient No   DVT prophylaxis: Apixaban   Code Status:    full  Family Communication:   No family at the bedside      Nutrition Status: Nutrition Problem: Increased nutrient needs Etiology: chronic illness (CHF) Signs/Symptoms: estimated needs Interventions: Ensure Enlive (each supplement provides 350kcal and 20 grams of protein), MVI     Skin Documentation: Pressure Injury 03/03/20 Sacrum Mid Stage 2 -  Partial thickness loss of dermis presenting as a shallow open injury with a red, pink wound bed without slough. (Active)  03/03/20 0226  Location: Sacrum  Location Orientation: Mid  Staging: Stage 2 -  Partial thickness loss of dermis presenting as a shallow open injury with a red, pink wound bed without slough.  Wound Description (Comments):   Present on Admission: Yes     Pressure Injury 01/03/21 Thigh Posterior;Right Stage 2 -  Partial thickness loss of dermis presenting as a shallow open injury with a red, pink wound bed without slough. (Active)  01/03/21 0048  Location: Thigh  Location Orientation: Posterior;Right  Staging: Stage 2 -  Partial thickness loss of dermis presenting as a shallow open injury with a red, pink wound bed without slough.  Wound Description (Comments):   Present on Admission: Yes     Pressure Injury 01/03/21 Coccyx Medial Unstageable - Full thickness tissue loss in which the base of the injury is covered by slough (yellow, tan, gray, green or brown) and/or eschar (tan, brown or black) in the wound bed. (Active)  01/03/21 0050  Location: Coccyx  Location Orientation: Medial  Staging: Unstageable - Full thickness tissue loss in which the base of the injury is covered by slough (yellow, tan, gray, green or brown) and/or eschar (tan, brown or black) in the wound bed.  Wound Description (Comments):   Present on Admission: Yes      Consultants:  Cardiology     Subjective: Patient continue to be very weal and deconditioned, no nausea or vomiting, not pain at the time of my examination   Objective: Vitals:   01/08/21 0400 01/08/21 0500 01/08/21 0600 01/08/21 1112  BP: (!) 137/47   (!) 151/49  Pulse: (!) 56   (!) 52  Resp: 18 19 17 18   Temp: 98 F (36.7 C)   98 F (36.7 C)  TempSrc: Oral     SpO2: 99%   100%  Weight: 79.1 kg     Height:        Intake/Output Summary (Last 24 hours) at 01/08/2021 1129 Last data filed at 01/08/2021 0900 Gross per 24 hour  Intake 757 ml  Output 1000 ml  Net -243 ml   Filed Weights   01/06/21 0313 01/07/21 0304 01/08/21 0400  Weight: 78.9 kg 78.9 kg 79.1 kg    Examination:   General: deconditioned  Neurology: eyes closed but answering questions and following simple commands E ENT: mild  pallor, no icterus, oral mucosa moist Cardiovascular: No JVD. S1-S2 present, rhythmic, no gallops, rubs, or murmurs. Trace lower extremity edema. Pulmonary: positive breath sounds bilaterally, adequate air movement, no wheezing, rhonchi or rales. Gastrointestinal. Abdomen soft and non tender Skin. No rashes Musculoskeletal: no joint deformities     Data Reviewed: I have personally reviewed following labs and imaging studies  CBC: Recent Labs  Lab 01/02/21 1133 01/03/21 0243 01/04/21 0503  WBC 7.6 6.9 7.4  NEUTROABS 6.3  --   --   HGB 9.1* 8.7* 8.0*  HCT 28.6* 26.4* 24.7*  MCV 97.3 94.3 94.6  PLT 147* 135* 469*   Basic Metabolic Panel: Recent Labs  Lab 01/03/21 0243 01/04/21 0503 01/05/21 0419 01/06/21 0215 01/08/21 0436  NA 132* 135 136 132* 135  K 3.4* 4.2 4.2 4.5 4.7  CL 98 102 101 101 103  CO2 26 27 27 26 26   GLUCOSE 100* 169* 152* 148* 139*  BUN 41* 47* 49* 48* 54*  CREATININE 1.62* 1.94* 2.08* 1.84* 1.59*  CALCIUM 8.8* 8.6* 9.0 8.8* 9.1  MG  --  2.0 2.1  --   --    GFR: Estimated Creatinine Clearance: 26.6 mL/min (A) (by C-G formula based on  SCr of 1.59 mg/dL (H)). Liver Function Tests: Recent Labs  Lab 01/02/21 1133 01/03/21 0243  AST 69* 59*  ALT 61* 57*  ALKPHOS 92 92  BILITOT 1.1 0.9  PROT 6.2* 5.7*  ALBUMIN 2.2* 2.0*   No results for input(s): LIPASE, AMYLASE in the last 168 hours. No results for input(s): AMMONIA in the last 168 hours. Coagulation Profile: No results for input(s): INR, PROTIME in the last 168 hours. Cardiac Enzymes: Recent Labs  Lab 01/06/21 0215  CKTOTAL 207   BNP (last 3 results) No results for input(s): PROBNP in the last 8760 hours. HbA1C: No results for input(s): HGBA1C in the last 72 hours. CBG: Recent Labs  Lab 01/03/21 0022 01/06/21 1155 01/06/21 1621 01/06/21 2112  GLUCAP 108* 150* 148* 161*   Lipid Profile: No results for input(s): CHOL, HDL, LDLCALC, TRIG, CHOLHDL, LDLDIRECT in the last 72 hours. Thyroid Function Tests: No results for input(s): TSH, T4TOTAL, FREET4, T3FREE, THYROIDAB in the last 72 hours. Anemia Panel: No results for input(s): VITAMINB12, FOLATE, FERRITIN, TIBC, IRON, RETICCTPCT in the last 72 hours.    Radiology Studies: I have reviewed all of the imaging during this hospital visit personally     Scheduled Meds:  amiodarone  200 mg Oral Daily   apixaban  2.5 mg Oral BID   vitamin C  1,000 mg Oral Daily   bupivacaine  10 mL Infiltration Once   cholecalciferol  2,000 Units Oral Daily   colchicine  0.3 mg Oral Daily   diclofenac Sodium  2 g Topical QID   feeding supplement  237 mL Oral BID BM   ferrous sulfate  325 mg Oral Q breakfast   Gerhardt's butt cream   Topical BID   methylPREDNISolone acetate  40 mg Intra-articular Once   multivitamin with minerals  1 tablet Oral Daily   rosuvastatin  10 mg Oral Daily   vitamin B-12  100 mcg Oral Daily   Continuous Infusions:   LOS: 6 days        Tully Burgo Gerome Apley, MD

## 2021-01-08 NOTE — TOC Progression Note (Signed)
Transition of Care [facility]) - Progression Note    Patient Details  Name: Carrie Monroe MRN: [id number] Date of Birth: 20-Jun-1935  Transition [facility]) CM/SW [place], [place] Phone Number: [phone] 01/08/2021, 2:16 PM  Clinical Narrative:     CSW received a requested that daughter in law Carrie Monroe wanted an update about placement. CSW spoke with Carrie Monroe and let her know that there were no new updated due to no reaching any one at [place] or [facility].  TOC team will continue to assist with discharge planning needs.   Expected Discharge Plan: [place] Barriers to Discharge: Continued Medical Work up  Expected Discharge Plan and Services Expected Discharge Plan: [place] In-house Referral: Clinical Social Work Discharge Planning Services: NA Post Acute Care Choice: [place] Living arrangements for the past 2 months: [place]                                       Social Determinants of Health (SDOH) Interventions    Readmission Risk Interventions Readmission Risk Prevention Plan 03/17/2020  Transportation Screening Complete  PCP or Specialist Appt within 3-5 Days Complete  HRI or [place] Complete  Social Work Consult for [place] Planning/Counseling Complete  Palliative Care Screening Not Applicable  Medication Review Press photographer) Complete  Some recent data might be hidden

## 2021-01-09 LAB — RESP PANEL BY RT-PCR (FLU A&B, COVID) ARPGX2
Influenza A by PCR: NEGATIVE
Influenza B by PCR: NEGATIVE
SARS Coronavirus 2 by RT PCR: NEGATIVE

## 2021-01-09 MED ORDER — ADULT MULTIVITAMIN W/MINERALS CH: 1.0000 | ORAL_TABLET | Freq: Every day | ORAL | 0 refills | Status: DC

## 2021-01-09 MED ORDER — DICLOFENAC SODIUM 1 % EX GEL
2.0000 g | Freq: Four times a day (QID) | CUTANEOUS | 0 refills | Status: DC
Start: 2021-01-09 — End: 2021-02-07

## 2021-01-09 MED ORDER — ENSURE ENLIVE PO LIQD: 237.0000 mL | Freq: Two times a day (BID) | ORAL | 0 refills | Status: DC

## 2021-01-09 MED ORDER — TRAMADOL HCL 50 MG PO TABS: 25.0000 mg | ORAL_TABLET | Freq: Four times a day (QID) | ORAL | 0 refills | Status: DC | PRN

## 2021-01-09 MED ORDER — ACETAMINOPHEN 325 MG PO TABS: 650.0000 mg | ORAL_TABLET | Freq: Four times a day (QID) | ORAL | Status: DC | PRN

## 2021-01-09 MED ORDER — COLCHICINE 0.6 MG PO TABS: 0.3000 mg | ORAL_TABLET | Freq: Every day | ORAL | 0 refills | Status: DC

## 2021-01-09 NOTE — TOC Progression Note (Signed)
Transition of Care [facility]) - Progression Note    Patient Details  Name: Carrie Monroe MRN: [id number] Date of Birth: 10-15-1935  Transition [facility] Care [facility]) CM/SW Contact  Reece Agar, [place] Phone Number: 01/09/2021, 2:31 PM  Clinical Narrative:    CSW spoke with Olivia Mackie at [facility], she confirmed that she would be able to offer pt a bed. Pt daughter in law was contacted and will fill out paperwork today. CSW confirmed pt auth as ref# [id number] for 5 days starting 01/06/2021.    Expected Discharge Plan: [place] Barriers to Discharge: Continued Medical Work up  Expected Discharge Plan and Services Expected Discharge Plan: [place] In-house Referral: Clinical Social Work Discharge Planning Services: NA Post Acute Care Choice: [place] Living arrangements for the past 2 months: [place]                                       Social Determinants of Health (SDOH) Interventions    Readmission Risk Interventions Readmission Risk Prevention Plan 03/17/2020  Transportation Screening Complete  PCP or Specialist Appt within 3-5 Days Complete  HRI or [place] Complete  Social Work Consult for [place] Planning/Counseling Complete  Palliative Care Screening Not Applicable  Medication Review Press photographer) Complete  Some recent data might be hidden

## 2021-01-09 NOTE — Progress Notes (Signed)
Nutrition Follow-up  DOCUMENTATION CODES:   Obesity unspecified  INTERVENTION:   -Continue Ensure Enlive po BID, each supplement provides 350 kcal and 20 grams of protein  -Continue MVI with minerals daily -Liberalize diet to regular  NUTRITION DIAGNOSIS:   Increased nutrient needs related to chronic illness (CHF) as evidenced by estimated needs.  Ongoing  GOAL:   Patient will meet greater than or equal to 90% of their needs  Progressing   MONITOR:   PO intake, Supplement acceptance, Labs, Weight trends, Skin, I & O's  REASON FOR ASSESSMENT:   Consult Assessment of nutrition requirement/status  ASSESSMENT:   Carrie Monroe is a 85 y.o. female with a past medical history of severe aortic stenosis status post-TAVR, coronary artery disease status post PCI/DES, chronic kidney disease, CHF, history of left breast cancer, carotid artery stenosis status post right TCAR, DM type II diet-controlled, history of nephrolithiasis, hyperlipidemia, hypertension, squamous cell carcinoma of the scalp  8/10- s/p rt knee effusion  Reviewed I/O's: -120 ml x 24 hours and -4.5 L since admission  UOP 700 ml x 24 hours  Pt unavailable at time of visit.   Pt with variable oral intake. Noted meal completions 30-100%. Pt is also consuming Ensure supplements.  Albumin has a half-life of 21 days and is strongly affected by stress response and inflammatory process, therefore, do not expect to see an improvement in this lab value during acute hospitalization. When a patient presents with low albumin, it is likely skewed due to the acute inflammatory response.  Unless it is suspected that patient had poor PO intake or malnutrition prior to admission, then RD should not be consulted solely for low albumin. Note that low albumin is no longer used to diagnose malnutrition; [place] uses the new malnutrition guidelines published by the American Society for Parenteral and Enteral Nutrition  (A.S.P.E.N.) and the Academy of Nutrition and Dietetics (AND).     Per MD notes, plan to d/c to SNF today.  Medications reviewed and include vitamin C, vitamin D3, colchicine, ferrous sulfate, depo-medrol, and vitamin B-12.   Labs reviewed: CBGS: 148-161 (inpatient orders for glycemic control are none).    Diet Order:   Diet Order             Diet regular Room service appropriate? Yes; Fluid consistency: Thin  Diet effective now           Diet - low sodium heart healthy                   EDUCATION NEEDS:   Education needs have been addressed  Skin:  Skin Assessment: Skin Integrity Issues: Skin Integrity Issues:: Stage II, Other (Comment) Stage II: rt posterior thigh Other: MASD/ IAD to lt buttock  Last BM:  01/08/21  Height:   Ht Readings from Last 1 Encounters:  01/03/21 5\' 2"  (1.575 m)    Weight:   Wt Readings from Last 1 Encounters:  01/09/21 77.4 kg    Ideal Body Weight:  50 kg  BMI:  Body mass index is 31.21 kg/m.  Estimated Nutritional Needs:   Kcal:  0962-8366  Protein:  100-115 grams  Fluid:  1.5 L    Loistine Chance, RD, LDN, [place] Registered Dietitian II Certified Diabetes Care and Education Specialist Please refer to [facility] for RD and/or RD on-call/weekend/after hours pager

## 2021-01-09 NOTE — TOC Transition Note (Addendum)
Transition of Care [facility]) - CM/SW Discharge Note   Patient Details  Name: Carrie Monroe MRN: [id number] Date of Birth: 11/27/1935  Transition of Care [facility]) CM/SW Contact:  Tresa Endo Phone Number: 01/09/2021, 3:00 PM   Clinical Narrative:    Patient will DC to: [facility] Anticipated DC date: 01/09/2021 Family notified: Pt Daughter-in-law Transport by: Corey Harold   Per MD patient ready for DC to [facility] room 708. RN to call report prior to discharge [phone]). RN, patient, patient's family, and facility notified of DC. Discharge Summary and FL2 sent to facility. DC packet on chart. Ambulance transport requested for patient.   CSW will sign off for now as social work intervention is no longer needed. Please consult [place] again if new needs arise.     Final next level of care: Skilled Nursing Facility Barriers to Discharge: Continued Medical Work up   Patient Goals and CMS Choice Patient states their goals for this hospitalization and ongoing recovery are:: Rehab CMS Medicare.gov Compare Post Acute Care list provided to:: Patient Choice offered to / list presented to : Patient  Discharge Placement                       Discharge Plan and Services In-house Referral: Clinical Social Work Discharge Planning Services: NA Post Acute Care Choice: [place]                               Social Determinants of Health (SDOH) Interventions     Readmission Risk Interventions Readmission Risk Prevention Plan 03/17/2020  Transportation Screening Complete  PCP or Specialist Appt within 3-5 Days Complete  HRI or [place] Complete  Social Work Consult for [place] Planning/Counseling Complete  Palliative Care Screening Not Applicable  Medication Review Press photographer) Complete  Some recent data might be hidden

## 2021-01-09 NOTE — Progress Notes (Signed)
PROGRESS NOTE    Carrie Monroe  EPP:[id number] DOB: 1935/09/02 DOA: 01/02/2021 PCP: Ginger Organ., MD    Brief Narrative:  Carrie Monroe was admitted to the hospital with working diagnosis of acute congestive heart failure exacerbation.   85 year old female past medical history for severe aortic stenosis status post TAVR, coronary artery disease, chronic kidney disease, heart failure, breast cancer, carotid artery stenosis, type 2 diabetes mellitus, dyslipidemia and hypertension who presented with severe fatigue and generalized weakness.  Reported several weeks of worsening symptoms, requiring assistance for activities of daily living.  Because of persistent symptoms she was brought to the hospital.  On her initial physical examination blood pressure 154/54, heart rate 54, respiratory rate 19, oxygen saturation 98%, she was ill-looking appearing, her lungs were clear to auscultation bilaterally, heart S1-S2, present, rhythmic, soft abdomen, positive 3+ extremity edema up to the knees.   Na 134, K 3,9, CL 101, bicarb 25, Glucose 115, BUN 45, cr 1,67, wbc 7.6, Hgb 9,1, Hct 28,6, PLT 147. SARS COVID 19 negative.   Urine SG 1,006 with negative nitrates.    Chest film with increase lung marking bilaterally, no infiltrates, positive left breast calcified lesion.  Left shoulder with subtle deformity of the left third rib suspicious for non displaced fracture.  Left shoulder arthrosis. Left knee with arthrosis.    EKG with 56 bpm, left axis deviation, left bundle branch block, sinus rhythm, with poor r wave progression, no ST segment or significant T wave changes.    Patient was placed on aggressive diuresis for volume overload.   For severe knee pain she underwent arthrocentesis with local steroid injection. She continue to be very weak and deconditioned.  Plan for transfer to SNF.    Assessment & Plan:   Active Problems:   Acute on chronic combined systolic and diastolic CHF  (congestive heart failure) (HCC)   Volume overload   Paroxysmal atrial fibrillation (HCC)   Tachycardia-bradycardia syndrome (HCC)   LBBB (left bundle branch block)   Acute on chronic diastolic heart failure decompensation. Severe aortic stenosis. (TAVR 2018). Pulmonary HTN class 2. Acute on chronic core pulmonale  Echocardiogram from 06/22 with LV EF 40 to 45%, LV with global hypokinesis. RV systolic function mildly reduced. RVSP 61. mmHg.  Severe biatrial dilatation.    Patient very weak and deconditioned, no clinical signs of volume overload. Will continue to hold furosemide for now.  Will liberate her diet and will consult nutrition for recommendations    2. CAD Continue with  asa and statin therapy   3. T2DM. Dyslipidemia  Continue glucose cover and monitoring with insulin sliding scale. Capillary glucose has been 108, 150, 148, 161  On rosuvastatin    4. AKI on CKD stage 3a  Clinically not volume overloaded. Will plan to resume diuretic therapy at discharge, patient continue to be very weak and deconditioned.,    5. 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 Plan to follow up with [facility] as outpatient  Head CT 07/22 with right parietal scalp vertex with erosion in the outer table, worrisome for bone involvement.  Old records personally reviewed dermatology from [place], 06/22.  Mention patient declined surgical intervention, and referred to radiation oncology. If bone involvement risk of treatment failure are significant with any intervention.   I spoke with her daughter in law and she agrees in consulting palliative care.    6. Right knee pain, (pseudogout).  Sp right knee aspiration 14 ml of orange fluid obtained.  40 mg methylprednisolone injection  Fluid positive for calcium pyrophosphate crystals, only 4,935 wbc 93% PMN. Gram stain no organism and culture with no growth. Ruled out septic arthritis.     Continue pain control with tramadol and topical diclofenac to  tender points.  Patient is sensitive to opioid analgesics with sedation.    7. Pressure ulcer thigh stage 2 and coccyx not able to stage. Continue with local skin care.    8. Iron deficiency anemia. Continue with iron supplementation    9. Chronic atrial fibrillation. Tachy -Brady syndrome  Rate control with amiodarone per cardiology recommendations., Continue anticoagulation with apixban   Her heart rate has been 52 to 61. Plan to follow up as outpatient with cardiology   Status is: Inpatient  Remains inpatient appropriate because:Inpatient level of care appropriate due to severity of illness  Dispo: The patient is from: Home              Anticipated d/c is to: SNF              Patient currently is medically stable to d/c.   Difficult to place patient No   DVT prophylaxis: Apixaban   Code Status:    full  Family Communication:   No family at the bedside     Nutrition Status: Nutrition Problem: Increased nutrient needs Etiology: chronic illness (CHF) Signs/Symptoms: estimated needs Interventions: Ensure Enlive (each supplement provides 350kcal and 20 grams of protein), MVI     Skin Documentation: Pressure Injury 03/03/20 Sacrum Mid Stage 2 -  Partial thickness loss of dermis presenting as a shallow open injury with a red, pink wound bed without slough. (Active)  03/03/20 0226  Location: Sacrum  Location Orientation: Mid  Staging: Stage 2 -  Partial thickness loss of dermis presenting as a shallow open injury with a red, pink wound bed without slough.  Wound Description (Comments):   Present on Admission: Yes     Pressure Injury 01/03/21 Thigh Posterior;Right Stage 2 -  Partial thickness loss of dermis presenting as a shallow open injury with a red, pink wound bed without slough. (Active)  01/03/21 0048  Location: Thigh  Location Orientation: Posterior;Right  Staging: Stage 2 -  Partial thickness loss of dermis presenting as a shallow open injury with a red, pink wound  bed without slough.  Wound Description (Comments):   Present on Admission: Yes     Pressure Injury 01/03/21 Coccyx Medial Unstageable - Full thickness tissue loss in which the base of the injury is covered by slough (yellow, tan, gray, green or brown) and/or eschar (tan, brown or black) in the wound bed. (Active)  01/03/21 0050  Location: Coccyx  Location Orientation: Medial  Staging: Unstageable - Full thickness tissue loss in which the base of the injury is covered by slough (yellow, tan, gray, green or brown) and/or eschar (tan, brown or black) in the wound bed.  Wound Description (Comments):   Present on Admission: Yes     Consultants:  Cardiology    Subjective: Patient continue to be very weak and deconditioned, but able to follow commands and answer questions appropriately, keep her eyes closed.   Objective: Vitals:   01/08/21 0600 01/08/21 1112 01/08/21 2030 01/09/21 0324  BP:  (!) 151/49 (!) 107/40 (!) 140/49  Pulse:  (!) 52 61   Resp: 17 18    Temp:  98 F (36.7 C) 98.7 F (37.1 C) 98.6 F (37 C)  TempSrc:   Oral Oral  SpO2:  100%  92% 92%  Weight:    77.4 kg  Height:        Intake/Output Summary (Last 24 hours) at 01/09/2021 1236 Last data filed at 01/09/2021 0324 Gross per 24 hour  Intake 360 ml  Output 700 ml  Net -340 ml   Filed Weights   01/07/21 0304 01/08/21 0400 01/09/21 0324  Weight: 78.9 kg 79.1 kg 77.4 kg    Examination:   General: Not in pain or dyspnea, deconditioned and ill looking appearing  Neurology: her eyes are closed but able to answer questions and follow commands.  E ENT: no pallor, no icterus, oral mucosa moist Cardiovascular: No JVD. S1-S2 present, rhythmic, no gallops, rubs, positive systolic murmur at the base. No lower extremity edema. Pulmonary: positive breath sounds bilaterally, with no wheezing, rhonchi or rales. Gastrointestinal. Abdomen soft and non tender Skin. Feet boots in place.  Musculoskeletal: no joint  deformities     Data Reviewed: I have personally reviewed following labs and imaging studies  CBC: Recent Labs  Lab 01/03/21 0243 01/04/21 0503  WBC 6.9 7.4  HGB 8.7* 8.0*  HCT 26.4* 24.7*  MCV 94.3 94.6  PLT 135* 161*   Basic Metabolic Panel: Recent Labs  Lab 01/03/21 0243 01/04/21 0503 01/05/21 0419 01/06/21 0215 01/08/21 0436  NA 132* 135 136 132* 135  K 3.4* 4.2 4.2 4.5 4.7  CL 98 102 101 101 103  CO2 26 27 27 26 26   GLUCOSE 100* 169* 152* 148* 139*  BUN 41* 47* 49* 48* 54*  CREATININE 1.62* 1.94* 2.08* 1.84* 1.59*  CALCIUM 8.8* 8.6* 9.0 8.8* 9.1  MG  --  2.0 2.1  --   --    GFR: Estimated Creatinine Clearance: 26.3 mL/min (A) (by C-G formula based on SCr of 1.59 mg/dL (H)). Liver Function Tests: Recent Labs  Lab 01/03/21 0243  AST 59*  ALT 57*  ALKPHOS 92  BILITOT 0.9  PROT 5.7*  ALBUMIN 2.0*   No results for input(s): LIPASE, AMYLASE in the last 168 hours. No results for input(s): AMMONIA in the last 168 hours. Coagulation Profile: No results for input(s): INR, PROTIME in the last 168 hours. Cardiac Enzymes: Recent Labs  Lab 01/06/21 0215  CKTOTAL 207   BNP (last 3 results) No results for input(s): PROBNP in the last 8760 hours. HbA1C: No results for input(s): HGBA1C in the last 72 hours. CBG: Recent Labs  Lab 01/03/21 0022 01/06/21 1155 01/06/21 1621 01/06/21 2112  GLUCAP 108* 150* 148* 161*   Lipid Profile: No results for input(s): CHOL, HDL, LDLCALC, TRIG, CHOLHDL, LDLDIRECT in the last 72 hours. Thyroid Function Tests: No results for input(s): TSH, T4TOTAL, FREET4, T3FREE, THYROIDAB in the last 72 hours. Anemia Panel: No results for input(s): VITAMINB12, FOLATE, FERRITIN, TIBC, IRON, RETICCTPCT in the last 72 hours.    Radiology Studies: I have reviewed all of the imaging during this hospital visit personally     Scheduled Meds:  amiodarone  200 mg Oral Daily   apixaban  2.5 mg Oral BID   vitamin C  1,000 mg Oral  Daily   bupivacaine  10 mL Infiltration Once   cholecalciferol  2,000 Units Oral Daily   colchicine  0.3 mg Oral Daily   diclofenac Sodium  2 g Topical QID   feeding supplement  237 mL Oral BID BM   ferrous sulfate  325 mg Oral Q breakfast   Gerhardt's butt cream   Topical BID   methylPREDNISolone acetate  40 mg Intra-articular Once  multivitamin with minerals  1 tablet Oral Daily   rosuvastatin  10 mg Oral Daily   vitamin B-12  100 mcg Oral Daily   Continuous Infusions:   LOS: 7 days        Aleea Hendry Gerome Apley, MD

## 2021-01-09 NOTE — Discharge Summary (Signed)
Physician Discharge Summary  Car[place] GDJ:[id number] DOB: January 15, 1936 DOA: 01/02/2021  PCP: Ginger Organ., MD  Admit date: 01/02/2021 Discharge date: 01/09/2021  Admitted From: Home  Disposition:   SNF   Recommendations for Outpatient Follow-up and new medication changes:  Follow up with Dr. Brigitte Pulse in 7 to 10 days.  Continue pain control with low dose tramadol, patient is sensitive to analgesics.  Follow up as outpatient with cardiology for atrial fibrillation Follow up as outpatient with radiation oncology  Continue furosemide 40 mg daily with instructions to increase to bid in case of worsening dyspnea, lower extremity edema or weight gain (3 lbs 48 hrs or 5 lbs in 7 days). Follow up renal function in 7 days.   Home Health: na   Equipment/Devices: na    Discharge Condition: stable  CODE STATUS: full  Diet recommendation:  regular   Brief/Interim Summary: Carrie [place] was admitted to the hospital with working diagnosis of acute congestive heart failure exacerbation.   85 year old female past medical history for severe aortic stenosis status post TAVR, coronary artery disease, chronic kidney disease, heart failure, breast cancer, carotid artery stenosis, type 2 diabetes mellitus, dyslipidemia, skin cancer and hypertension who presented with severe fatigue and generalized weakness.  Reported several weeks of worsening symptoms, requiring assistance for activities of daily living.  Because of persistent symptoms she was brought to the hospital.  On her initial physical examination blood pressure 154/54, heart rate 54, respiratory rate 19, oxygen saturation 98%, she was ill-looking appearing, her lungs were clear to auscultation bilaterally, heart S1-S2, present, rhythmic, soft abdomen, positive 3+ extremity edema up to the knees.   Na 134, K 3,9, CL 101, bicarb 25, Glucose 115, BUN 45, cr 1,67, wbc 7.6, Hgb 9,1, Hct 28,6, PLT 147. SARS COVID 19 negative.   Urine SG 1,006 with  negative nitrates.    Chest film with increase lung marking bilaterally, no infiltrates, positive left breast calcified lesion.  Left shoulder with subtle deformity of the left third rib suspicious for non displaced fracture.  Left shoulder arthrosis. Left knee with arthrosis.    EKG with 56 bpm, left axis deviation, left bundle branch block, sinus rhythm, with poor r wave progression, no ST segment or significant T wave changes.    Patient was placed on aggressive diuresis for volume overload.   For severe knee pain she underwent arthrocentesis with local steroid injection. She continue to be very weak and deconditioned.  Plan for transfer to SNF.        Acute on chronic diastolic heart failure decompensation. Severe aortic stenosis. (TAVR 2018). Pulmonary HTN class 2. Acute on chronic core pulmonale  Echocardiogram from 06/22 with LV EF 40 to 45%, LV with global hypokinesis. RV systolic function mildly reduced. RVSP 61. mmHg.  Severe biatrial dilatation.    Patient initially diuresed with intravenous furosemide with good response. Because of severe deconditioning her diuretics were held over the last few days. At discharge her kidney function has improved and she will resume diuretic therapy. Her diet has been liberated to improve her nutrition. To consider outpatient nutrition consultation.   2. CAD Continue with  asa and statin therapy   3. T2DM. Dyslipidemia  Capillary glucose has been 108, 150, 148, 161  On rosuvastatin  Continue to encourage p.o. intake.   4. AKI on CKD stage 3a  After initial IV diuresis her volume status improved.   At her discharge sodium 135, potassium 4.7, chloride 103, bicarb 26, glucose 139,  BUN 54, creatinine 1.59.  Continue diuresis as an outpatient.   5. 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 Plan to follow up with [facility] as outpatient  Head CT 07/22 with right parietal scalp vertex with erosion in the outer table, worrisome for  bone involvement.  Old records personally reviewed dermatology from [place], 06/22.  Mention patient declined surgical intervention, and referred to radiation oncology. If bone involvement risk of treatment failure are significant with any intervention.    I spoke with her daughter in law and she agrees in consulting palliative care.  A bed at the skilled nursing facility has been approved today, will refer outpatient palliative care consultation.   6. Right knee pain, (pseudogout).  Sp right knee aspiration 14 ml of orange fluid obtained.  40 mg methylprednisolone injection  Fluid positive for calcium pyrophosphate crystals, only 4,935 wbc 93% PMN. Gram stain no organism and culture with no growth. Ruled out septic arthritis.     Continue pain control with tramadol and topical diclofenac to tender points.  Patient is sensitive to opioid analgesics with sedation.    7. Pressure ulcer thigh- mid sacrum stage 2 and coccyx not able to stage. Continue with local skin care.    8. Iron deficiency anemia. Continue with iron supplementation    9. Chronic atrial fibrillation. Tachy -Brady syndrome  Rate control with amiodarone per cardiology recommendations., Continue anticoagulation with apixban   Her heart rate has been 52 to 61. Plan to follow up as outpatient with cardiology    Discharge Diagnoses:  Active Problems:   Acute on chronic combined systolic and diastolic CHF (congestive heart failure) (HCC)   Volume overload   Paroxysmal atrial fibrillation (HCC)   Tachycardia-bradycardia syndrome (HCC)   LBBB (left bundle branch block)    Discharge Instructions   Allergies as of 01/09/2021       Reactions   Oxycodone Other (See Comments)   Hallunication        Medication List     STOP taking these medications    HYDROcodone-acetaminophen 5-325 MG tablet Commonly known as: NORCO/VICODIN       TAKE these medications    acetaminophen 325 MG tablet Commonly known as:  TYLENOL Take 2 tablets (650 mg total) by mouth every 6 (six) hours as needed for mild pain or moderate pain (or Fever >/= 101).   amiodarone 200 MG tablet Commonly known as: PACERONE Take 1 tablet (200 mg total) by mouth daily.   apixaban 2.5 MG Tabs tablet Commonly known as: ELIQUIS Take 1 tablet (2.5 mg total) by mouth 2 (two) times daily.   colchicine 0.6 MG tablet Take 0.5 tablets (0.3 mg total) by mouth daily. Start taking on: January 10, 2021   COLLAGEN PO Take 5 g by mouth daily. power   diclofenac Sodium 1 % Gel Commonly known as: VOLTAREN Apply 2 g topically 4 (four) times daily. Tender joints   feeding supplement Liqd Take 237 mLs by mouth 2 (two) times daily between meals. Start taking on: January 10, 2021   ferrous sulfate 325 (65 FE) MG EC tablet Take 325 mg by mouth daily.   furosemide 40 MG tablet Commonly known as: LASIX Take 40 mg by mouth daily.   multivitamin with minerals Tabs tablet Take 1 tablet by mouth daily. Start taking on: January 10, 2021   rosuvastatin 10 MG tablet Commonly known as: CRESTOR Take 10 mg by mouth daily.   traMADol 50 MG tablet Commonly known as: ULTRAM Take  0.5 tablets (25 mg total) by mouth every 6 (six) hours as needed for moderate pain or severe pain.   VITAMIN B 12 PO Take 1 Dose by mouth daily. liquid   vitamin C 1000 MG tablet Take 1,000 mg by mouth daily.   Vitamin D 50 MCG (2000 UT) tablet Take 2,000 Units by mouth daily.               Discharge Care Instructions  (From admission, onward)           Start     Ordered   01/09/21 0000  Discharge wound care:       Comments: Every shift clean the sacral area with no rinse cleanser, dry and apply Gerhardt's butt cream to the entire area bid and PRN soiling   01/09/21 1459            Follow-up Information     Deberah Pelton, NP Follow up on 03/13/2021.   Specialty: Cardiology Why: Cardiology Follow-up on 03/13/2021 at 3:45 PM. Contact  information: [address] [place] [address] [phone]                Allergies  Allergen Reactions   Oxycodone Other (See Comments)    Hallunication    Consultations: Cardiology    Procedures/Studies: DG Chest 1 View  Result Date: 01/02/2021 CLINICAL DATA:  Generalized weakness over the last 2 weeks. Left shoulder struck on door frame this morning. EXAM: CHEST  1 VIEW COMPARISON:  03/02/2020 FINDINGS: Mild to moderate enlargement of the cardiopericardial silhouette without evidence of overt edema. Prior TAVR. Atherosclerotic calcification of the aortic arch. Mitral valve calcification. Chronic rim calcified lesion or structure in the left breast as shown on prior exams such 01/10/2017. Suspected right distal clavicular resection. No discrete airspace opacity is identified. IMPRESSION: 1. Mild to moderate enlargement of the cardiopericardial silhouette, without edema or airspace opacity identified. 2. Prior TAVR.  Mitral valve calcification. 3. Chronic calcified lesion in the left breast, not changed from prior exams. 4.  Aortic Atherosclerosis (ICD10-I70.0). Electronically Signed   By: Van Clines M.D.   On: 01/02/2021 12:38   DG Shoulder Left  Result Date: 01/02/2021 CLINICAL DATA:  Struck left shoulder on door frame this morning. EXAM: LEFT SHOULDER - 2+ VIEW COMPARISON:  Chest radiograph 03/02/2020 FINDINGS: Degenerative glenohumeral spurring. Substantially reduced acromiohumeral distance with subacromial spurring, raising the likelihood of chronic supraspinatus tendon tear. Mild degenerative AC joint spurring. Subtle deformity of the left third rib suspicious for nondisplaced fracture. Atherosclerotic calcification of the aortic arch. IMPRESSION: 1. Subtle deformity of the left third rib suspicious for nondisplaced fracture. 2. Degenerative glenohumeral spurring and degenerative AC joint spurring. 3. Subacromial spur with markedly reduced acromial humeral  space favoring chronic supraspinatus tendon tear. 4.  Aortic Atherosclerosis (ICD10-I70.0). Electronically Signed   By: Van Clines M.D.   On: 01/02/2021 12:42   DG Knee Complete 4 Views Right  Result Date: 01/02/2021 CLINICAL DATA:  Weakness.  Right knee pain EXAM: RIGHT KNEE - COMPLETE 4+ VIEW COMPARISON:  Report from 11/02/2002 FINDINGS: SFA and popliteal artery atherosclerotic calcification. Medial compartmental arthroplasty. Lateral meniscal chondrocalcinosis. There is a moderate knee joint effusion. Marginal spurring in the patellofemoral and lateral compartment. Questionable slight medial tibial plateau volume loss along the prosthesis, likely chronic. I do not observe a fracture or an acute bony finding. IMPRESSION: 1. Moderate knee joint effusion. 2. Chondrocalcinosis with spurring in the lateral compartment and patellofemoral joint. 3. Medial compartmental  arthroplasty, equivocal mild volume loss in the medial tibial plateau but below the tibial tray component. Electronically Signed   By: Van Clines M.D.   On: 01/02/2021 12:53      Subjective: Patient continue to be very weak and deconditioned, but able to follow commands and answer questions appropriately, keep her eyes closed.   Discharge Exam: Vitals:   01/08/21 2030 01/09/21 0324  BP: (!) 107/40 (!) 140/49  Pulse: 61   Resp:    Temp: 98.7 F (37.1 C) 98.6 F (37 C)  SpO2: 92% 92%   Vitals:   01/08/21 0600 01/08/21 1112 01/08/21 2030 01/09/21 0324  BP:  (!) 151/49 (!) 107/40 (!) 140/49  Pulse:  (!) 52 61   Resp: 17 18    Temp:  98 F (36.7 C) 98.7 F (37.1 C) 98.6 F (37 C)  TempSrc:   Oral Oral  SpO2:  100% 92% 92%  Weight:    77.4 kg  Height:        General: Not in pain or dyspnea, deconditioned and ill looking appearing  Neurology: her eyes are closed but able to answer questions and follow commands.  E ENT: no pallor, no icterus, oral mucosa moist Cardiovascular: No JVD. S1-S2 present, rhythmic,  no gallops, rubs, positive systolic murmur at the base. No lower extremity edema. Pulmonary: positive breath sounds bilaterally, with no wheezing, rhonchi or rales. Gastrointestinal. Abdomen soft and non tender Skin. Feet boots in place.  Musculoskeletal: no joint deformities    The results of significant diagnostics from this hospitalization (including imaging, microbiology, ancillary and laboratory) are listed below for reference.     Microbiology: Recent Results (from the past 240 hour(s))  Resp Panel by RT-PCR (Flu A&B, Covid) Nasopharyngeal Swab     Status: None   Collection Time: 01/02/21  2:07 PM   Specimen: Nasopharyngeal Swab; Nasopharyngeal(NP) swabs in vial transport medium  Result Value Ref Range Status   SARS Coronavirus 2 by RT PCR NEGATIVE NEGATIVE Final    Comment: (NOTE) SARS-CoV-2 target nucleic acids are NOT DETECTED.  The SARS-CoV-2 RNA is generally detectable in upper respiratory specimens during the acute phase of infection. The lowest concentration of SARS-CoV-2 viral copies this assay can detect is 138 copies/mL. A negative result does not preclude SARS-Cov-2 infection and should not be used as the sole basis for treatment or other patient management decisions. A negative result may occur with  improper specimen collection/handling, submission of specimen other than nasopharyngeal swab, presence of viral mutation(s) within the areas targeted by this assay, and inadequate number of viral copies(<138 copies/mL). A negative result must be combined with clinical observations, patient history, and epidemiological information. The expected result is Negative.  Fact Sheet for Patients:  EntrepreneurPulse.com.au  Fact Sheet for Healthcare Providers:  IncredibleEmployment.be  This test is no t yet approved or cleared by the [place] FDA and  has been authorized for detection and/or diagnosis of SARS-CoV-2 by FDA under an  Emergency Use Authorization (EUA). This EUA will remain  in effect (meaning this test can be used) for the duration of the COVID-19 declaration under Section 564(b)(1) of the Act, 21 U.S.C.section 360bbb-3(b)(1), unless the authorization is terminated  or revoked sooner.       Influenza A by PCR NEGATIVE NEGATIVE Final   Influenza B by PCR NEGATIVE NEGATIVE Final    Comment: (NOTE) The Xpert Xpress SARS-CoV-2/FLU/RSV plus assay is intended as an aid in the diagnosis of influenza from Nasopharyngeal swab specimens and should  not be used as a sole basis for treatment. Nasal washings and aspirates are unacceptable for Xpert Xpress SARS-CoV-2/FLU/RSV testing.  Fact Sheet for Patients: EntrepreneurPulse.com.au  Fact Sheet for Healthcare Providers: IncredibleEmployment.be  This test is not yet approved or cleared by the [place] FDA and has been authorized for detection and/or diagnosis of SARS-CoV-2 by FDA under an Emergency Use Authorization (EUA). This EUA will remain in effect (meaning this test can be used) for the duration of the COVID-19 declaration under Section 564(b)(1) of the Act, 21 U.S.C. section 360bbb-3(b)(1), unless the authorization is terminated or revoked.  Performed at [facility], [place] [address], [place], [place] [address]   Body fluid culture w Gram Stain     Status: None   Collection Time: 01/04/21  9:19 AM   Specimen: Body Fluid  Result Value Ref Range Status   Specimen Description FLUID  Final   Special Requests SYNOVIAL, RIGHT KNEE  Final   Gram Stain   Final    ABUNDANT WBC PRESENT, PREDOMINANTLY PMN NO ORGANISMS SEEN    Culture   Final    NO GROWTH 3 DAYS Performed at [facility], [address] [place], [place] [address]    Report Status 01/07/2021 FINAL  Final     Labs: BNP (last 3 results) Recent Labs    03/02/20 1936 01/02/21 1209  BNP 529.1* 829.5*   Basic Metabolic  Panel: Recent Labs  Lab 01/03/21 0243 01/04/21 0503 01/05/21 0419 01/06/21 0215 01/08/21 0436  NA 132* 135 136 132* 135  K 3.4* 4.2 4.2 4.5 4.7  CL 98 102 101 101 103  CO2 26 27 27 26 26   GLUCOSE 100* 169* 152* 148* 139*  BUN 41* 47* 49* 48* 54*  CREATININE 1.62* 1.94* 2.08* 1.84* 1.59*  CALCIUM 8.8* 8.6* 9.0 8.8* 9.1  MG  --  2.0 2.1  --   --    Liver Function Tests: Recent Labs  Lab 01/03/21 0243  AST 59*  ALT 57*  ALKPHOS 92  BILITOT 0.9  PROT 5.7*  ALBUMIN 2.0*   No results for input(s): LIPASE, AMYLASE in the last 168 hours. No results for input(s): AMMONIA in the last 168 hours. CBC: Recent Labs  Lab 01/03/21 0243 01/04/21 0503  WBC 6.9 7.4  HGB 8.7* 8.0*  HCT 26.4* 24.7*  MCV 94.3 94.6  PLT 135* 135*   Cardiac Enzymes: Recent Labs  Lab 01/06/21 0215  CKTOTAL 207   BNP: Invalid input(s): POCBNP CBG: Recent Labs  Lab 01/03/21 0022 01/06/21 1155 01/06/21 1621 01/06/21 2112  GLUCAP 108* 150* 148* 161*   D-Dimer No results for input(s): DDIMER in the last 72 hours. Hgb A1c No results for input(s): HGBA1C in the last 72 hours. Lipid Profile No results for input(s): CHOL, HDL, LDLCALC, TRIG, CHOLHDL, LDLDIRECT in the last 72 hours. Thyroid function studies No results for input(s): TSH, T4TOTAL, T3FREE, THYROIDAB in the last 72 hours.  Invalid input(s): FREET3 Anemia work up No results for input(s): VITAMINB12, FOLATE, FERRITIN, TIBC, IRON, RETICCTPCT in the last 72 hours. Urinalysis    Component Value Date/Time   COLORURINE STRAW (A) 01/02/2021 1837   APPEARANCEUR CLEAR 01/02/2021 1837   LABSPEC 1.006 01/02/2021 1837   PHURINE 7.0 01/02/2021 1837   GLUCOSEU NEGATIVE 01/02/2021 1837   HGBUR NEGATIVE 01/02/2021 1837   BILIRUBINUR NEGATIVE 01/02/2021 [place] 01/02/2021 1837   PROTEINUR NEGATIVE 01/02/2021 1837   UROBILINOGEN 1.0 07/28/2013 1305   NITRITE NEGATIVE  01/02/2021 1837   LEUKOCYTESUR NEGATIVE 01/02/2021 1837    Sepsis Labs Invalid input(s): PROCALCITONIN,  WBC,  LACTICIDVEN Microbiology Recent Results (from the past 240 hour(s))  Resp Panel by RT-PCR (Flu A&B, Covid) Nasopharyngeal Swab     Status: None   Collection Time: 01/02/21  2:07 PM   Specimen: Nasopharyngeal Swab; Nasopharyngeal(NP) swabs in vial transport medium  Result Value Ref Range Status   SARS Coronavirus 2 by RT PCR NEGATIVE NEGATIVE Final    Comment: (NOTE) SARS-CoV-2 target nucleic acids are NOT DETECTED.  The SARS-CoV-2 RNA is generally detectable in upper respiratory specimens during the acute phase of infection. The lowest concentration of SARS-CoV-2 viral copies this assay can detect is 138 copies/mL. A negative result does not preclude SARS-Cov-2 infection and should not be used as the sole basis for treatment or other patient management decisions. A negative result may occur with  improper specimen collection/handling, submission of specimen other than nasopharyngeal swab, presence of viral mutation(s) within the areas targeted by this assay, and inadequate number of viral copies(<138 copies/mL). A negative result must be combined with clinical observations, patient history, and epidemiological information. The expected result is Negative.  Fact Sheet for Patients:  EntrepreneurPulse.com.au  Fact Sheet for Healthcare Providers:  IncredibleEmployment.be  This test is no t yet approved or cleared by the [place] FDA and  has been authorized for detection and/or diagnosis of SARS-CoV-2 by FDA under an Emergency Use Authorization (EUA). This EUA will remain  in effect (meaning this test can be used) for the duration of the COVID-19 declaration under Section 564(b)(1) of the Act, 21 U.S.C.section 360bbb-3(b)(1), unless the authorization is terminated  or revoked sooner.       Influenza A by PCR NEGATIVE NEGATIVE Final   Influenza B by PCR NEGATIVE NEGATIVE Final     Comment: (NOTE) The Xpert Xpress SARS-CoV-2/FLU/RSV plus assay is intended as an aid in the diagnosis of influenza from Nasopharyngeal swab specimens and should not be used as a sole basis for treatment. Nasal washings and aspirates are unacceptable for Xpert Xpress SARS-CoV-2/FLU/RSV testing.  Fact Sheet for Patients: EntrepreneurPulse.com.au  Fact Sheet for Healthcare Providers: IncredibleEmployment.be  This test is not yet approved or cleared by the [place] FDA and has been authorized for detection and/or diagnosis of SARS-CoV-2 by FDA under an Emergency Use Authorization (EUA). This EUA will remain in effect (meaning this test can be used) for the duration of the COVID-19 declaration under Section 564(b)(1) of the Act, 21 U.S.C. section 360bbb-3(b)(1), unless the authorization is terminated or revoked.  Performed at [facility], [place] [address], [place], [place] [address]   Body fluid culture w Gram Stain     Status: None   Collection Time: 01/04/21  9:19 AM   Specimen: Body Fluid  Result Value Ref Range Status   Specimen Description FLUID  Final   Special Requests SYNOVIAL, RIGHT KNEE  Final   Gram Stain   Final    ABUNDANT WBC PRESENT, PREDOMINANTLY PMN NO ORGANISMS SEEN    Culture   Final    NO GROWTH 3 DAYS Performed at [facility], [address] [place], [place] [address]    Report Status 01/07/2021 FINAL  Final     Time coordinating discharge: 45 minutes  SIGNED:   Tawni Millers, MD  [facility] 01/09/2021, 2:41 PM

## 2021-01-09 NOTE — Progress Notes (Signed)
PT Cancellation Note  Patient Details Name: [place] MRN: [id number] DOB: Jan 21, 1936   Cancelled Treatment:    Reason Eval/Treat Not Completed: Pain limiting ability to participate;Fatigue/lethargy limiting ability to participate.  Reports too much LE pain and too tired to get OOB.  Retry as time and pt allow.   Ramond Dial 01/09/2021, 11:30 AM  Mee Hives, PT MS Acute Rehab Dept. Number: [place] and [place]

## 2021-01-09 NOTE — Progress Notes (Signed)
Pt in severe pain and requested tramadol. Pt is alert and oriented x4. Pt stated tylenol does not help pain and that she wanted the tramadol. Voltaren gel also applied to knees and pt was repositioned to help with pain.

## 2021-01-09 NOTE — Care Management Important Message (Signed)
Important Message  Patient Details  Name: CHISTINE DEMATTEO MRN: [id number] Date of Birth: October 28, 1935   Medicare Important Message Given:  Yes     Shelda Altes 01/09/2021, 9:15 AM

## 2021-01-18 ENCOUNTER — Encounter (HOSPITAL_COMMUNITY): Payer: Self-pay | Admitting: Internal Medicine

## 2021-01-18 ENCOUNTER — Emergency Department (HOSPITAL_COMMUNITY): Payer: Medicare Other

## 2021-01-18 ENCOUNTER — Inpatient Hospital Stay (HOSPITAL_COMMUNITY)
Admission: EM | Admit: 2021-01-18 | Discharge: 2021-02-07 | DRG: 682 | Disposition: A | Discharge status: Hospice | Payer: Medicare Other | Source: Skilled Nursing Facility | Attending: Internal Medicine | Admitting: Internal Medicine

## 2021-01-18 ENCOUNTER — Observation Stay (HOSPITAL_COMMUNITY): Payer: Medicare Other

## 2021-01-18 DIAGNOSIS — D631 Anemia in chronic kidney disease: Secondary | ICD-10-CM | POA: Diagnosis present

## 2021-01-18 DIAGNOSIS — I25110 Atherosclerotic heart disease of native coronary artery with unstable angina pectoris: Secondary | ICD-10-CM | POA: Diagnosis present

## 2021-01-18 DIAGNOSIS — L89153 Pressure ulcer of sacral region, stage 3: Secondary | ICD-10-CM | POA: Diagnosis present

## 2021-01-18 DIAGNOSIS — I2510 Atherosclerotic heart disease of native coronary artery without angina pectoris: Secondary | ICD-10-CM | POA: Diagnosis present

## 2021-01-18 DIAGNOSIS — Z955 Presence of coronary angioplasty implant and graft: Secondary | ICD-10-CM

## 2021-01-18 DIAGNOSIS — Z515 Encounter for palliative care: Secondary | ICD-10-CM

## 2021-01-18 DIAGNOSIS — G9340 Encephalopathy, unspecified: Secondary | ICD-10-CM | POA: Diagnosis not present

## 2021-01-18 DIAGNOSIS — Z953 Presence of xenogenic heart valve: Secondary | ICD-10-CM

## 2021-01-18 DIAGNOSIS — Z7901 Long term (current) use of anticoagulants: Secondary | ICD-10-CM

## 2021-01-18 DIAGNOSIS — B965 Pseudomonas (aeruginosa) (mallei) (pseudomallei) as the cause of diseases classified elsewhere: Secondary | ICD-10-CM | POA: Diagnosis present

## 2021-01-18 DIAGNOSIS — Z85828 Personal history of other malignant neoplasm of skin: Secondary | ICD-10-CM

## 2021-01-18 DIAGNOSIS — Z833 Family history of diabetes mellitus: Secondary | ICD-10-CM

## 2021-01-18 DIAGNOSIS — F419 Anxiety disorder, unspecified: Secondary | ICD-10-CM | POA: Diagnosis present

## 2021-01-18 DIAGNOSIS — N179 Acute kidney failure, unspecified: Principal | ICD-10-CM | POA: Diagnosis present

## 2021-01-18 DIAGNOSIS — N139 Obstructive and reflux uropathy, unspecified: Secondary | ICD-10-CM

## 2021-01-18 DIAGNOSIS — N136 Pyonephrosis: Secondary | ICD-10-CM | POA: Diagnosis present

## 2021-01-18 DIAGNOSIS — Z8249 Family history of ischemic heart disease and other diseases of the circulatory system: Secondary | ICD-10-CM

## 2021-01-18 DIAGNOSIS — R627 Adult failure to thrive: Secondary | ICD-10-CM | POA: Diagnosis present

## 2021-01-18 DIAGNOSIS — L89313 Pressure ulcer of right buttock, stage 3: Secondary | ICD-10-CM | POA: Diagnosis present

## 2021-01-18 DIAGNOSIS — Z6831 Body mass index (BMI) 31.0-31.9, adult: Secondary | ICD-10-CM

## 2021-01-18 DIAGNOSIS — E46 Unspecified protein-calorie malnutrition: Secondary | ICD-10-CM | POA: Diagnosis present

## 2021-01-18 DIAGNOSIS — Z96653 Presence of artificial knee joint, bilateral: Secondary | ICD-10-CM | POA: Diagnosis present

## 2021-01-18 DIAGNOSIS — E1122 Type 2 diabetes mellitus with diabetic chronic kidney disease: Secondary | ICD-10-CM | POA: Diagnosis present

## 2021-01-18 DIAGNOSIS — Z853 Personal history of malignant neoplasm of breast: Secondary | ICD-10-CM

## 2021-01-18 DIAGNOSIS — I959 Hypotension, unspecified: Secondary | ICD-10-CM | POA: Diagnosis not present

## 2021-01-18 DIAGNOSIS — Z885 Allergy status to narcotic agent status: Secondary | ICD-10-CM

## 2021-01-18 DIAGNOSIS — Z66 Do not resuscitate: Secondary | ICD-10-CM | POA: Diagnosis present

## 2021-01-18 DIAGNOSIS — G928 Other toxic encephalopathy: Secondary | ICD-10-CM | POA: Diagnosis present

## 2021-01-18 DIAGNOSIS — Z7189 Other specified counseling: Secondary | ICD-10-CM

## 2021-01-18 DIAGNOSIS — D539 Nutritional anemia, unspecified: Secondary | ICD-10-CM | POA: Diagnosis present

## 2021-01-18 DIAGNOSIS — I130 Hypertensive heart and chronic kidney disease with heart failure and stage 1 through stage 4 chronic kidney disease, or unspecified chronic kidney disease: Secondary | ICD-10-CM | POA: Diagnosis present

## 2021-01-18 DIAGNOSIS — I10 Essential (primary) hypertension: Secondary | ICD-10-CM | POA: Diagnosis present

## 2021-01-18 DIAGNOSIS — I350 Nonrheumatic aortic (valve) stenosis: Secondary | ICD-10-CM

## 2021-01-18 DIAGNOSIS — Z952 Presence of prosthetic heart valve: Secondary | ICD-10-CM

## 2021-01-18 DIAGNOSIS — Z9841 Cataract extraction status, right eye: Secondary | ICD-10-CM

## 2021-01-18 DIAGNOSIS — E860 Dehydration: Secondary | ICD-10-CM | POA: Diagnosis present

## 2021-01-18 DIAGNOSIS — R7989 Other specified abnormal findings of blood chemistry: Secondary | ICD-10-CM | POA: Diagnosis present

## 2021-01-18 DIAGNOSIS — K7460 Unspecified cirrhosis of liver: Secondary | ICD-10-CM | POA: Diagnosis not present

## 2021-01-18 DIAGNOSIS — I495 Sick sinus syndrome: Secondary | ICD-10-CM

## 2021-01-18 DIAGNOSIS — N19 Unspecified kidney failure: Secondary | ICD-10-CM

## 2021-01-18 DIAGNOSIS — Z20822 Contact with and (suspected) exposure to covid-19: Secondary | ICD-10-CM | POA: Diagnosis present

## 2021-01-18 DIAGNOSIS — E785 Hyperlipidemia, unspecified: Secondary | ICD-10-CM | POA: Diagnosis present

## 2021-01-18 DIAGNOSIS — B952 Enterococcus as the cause of diseases classified elsewhere: Secondary | ICD-10-CM | POA: Diagnosis present

## 2021-01-18 DIAGNOSIS — E870 Hyperosmolality and hypernatremia: Secondary | ICD-10-CM | POA: Diagnosis present

## 2021-01-18 DIAGNOSIS — N390 Urinary tract infection, site not specified: Secondary | ICD-10-CM | POA: Diagnosis present

## 2021-01-18 DIAGNOSIS — Z79899 Other long term (current) drug therapy: Secondary | ICD-10-CM

## 2021-01-18 DIAGNOSIS — E1165 Type 2 diabetes mellitus with hyperglycemia: Secondary | ICD-10-CM | POA: Diagnosis present

## 2021-01-18 DIAGNOSIS — L98421 Non-pressure chronic ulcer of back limited to breakdown of skin: Secondary | ICD-10-CM

## 2021-01-18 DIAGNOSIS — I330 Acute and subacute infective endocarditis: Secondary | ICD-10-CM

## 2021-01-18 DIAGNOSIS — N184 Chronic kidney disease, stage 4 (severe): Secondary | ICD-10-CM | POA: Diagnosis present

## 2021-01-18 DIAGNOSIS — I4821 Permanent atrial fibrillation: Secondary | ICD-10-CM | POA: Diagnosis present

## 2021-01-18 DIAGNOSIS — I4819 Other persistent atrial fibrillation: Secondary | ICD-10-CM | POA: Diagnosis present

## 2021-01-18 DIAGNOSIS — I5042 Chronic combined systolic (congestive) and diastolic (congestive) heart failure: Secondary | ICD-10-CM | POA: Diagnosis present

## 2021-01-18 DIAGNOSIS — N170 Acute kidney failure with tubular necrosis: Secondary | ICD-10-CM | POA: Diagnosis present

## 2021-01-18 DIAGNOSIS — G9341 Metabolic encephalopathy: Secondary | ICD-10-CM | POA: Diagnosis present

## 2021-01-18 DIAGNOSIS — Z961 Presence of intraocular lens: Secondary | ICD-10-CM | POA: Diagnosis present

## 2021-01-18 DIAGNOSIS — R7881 Bacteremia: Secondary | ICD-10-CM | POA: Diagnosis present

## 2021-01-18 DIAGNOSIS — Z9842 Cataract extraction status, left eye: Secondary | ICD-10-CM

## 2021-01-18 LAB — CBC
HCT: 30.5 % — ABNORMAL LOW (ref 36.0–46.0)
Hemoglobin: 9.5 g/dL — ABNORMAL LOW (ref 12.0–15.0)
MCH: 31.3 pg (ref 26.0–34.0)
MCHC: 31.1 g/dL (ref 30.0–36.0)
MCV: 100.3 fL — ABNORMAL HIGH (ref 80.0–100.0)
Platelets: 171 10*3/uL (ref 150–400)
RBC: 3.04 MIL/uL — ABNORMAL LOW (ref 3.87–5.11)
RDW: 17.5 % — ABNORMAL HIGH (ref 11.5–15.5)
WBC: 10.4 10*3/uL (ref 4.0–10.5)
nRBC: 0 % (ref 0.0–0.2)

## 2021-01-18 LAB — URINALYSIS, ROUTINE W REFLEX MICROSCOPIC
Bilirubin Urine: NEGATIVE
Glucose, UA: NEGATIVE mg/dL
Hgb urine dipstick: NEGATIVE
Ketones, ur: NEGATIVE mg/dL
Nitrite: NEGATIVE
Protein, ur: NEGATIVE mg/dL
Specific Gravity, Urine: 1.012 (ref 1.005–1.030)
pH: 5 (ref 5.0–8.0)

## 2021-01-18 LAB — COMPREHENSIVE METABOLIC PANEL
ALT: 104 U/L — ABNORMAL HIGH (ref 0–44)
AST: 106 U/L — ABNORMAL HIGH (ref 15–41)
Albumin: 2.1 g/dL — ABNORMAL LOW (ref 3.5–5.0)
Alkaline Phosphatase: 108 U/L (ref 38–126)
Anion gap: 10 (ref 5–15)
BUN: 137 mg/dL — ABNORMAL HIGH (ref 8–23)
CO2: 23 mmol/L (ref 22–32)
Calcium: 9.6 mg/dL (ref 8.9–10.3)
Chloride: 104 mmol/L (ref 98–111)
Creatinine, Ser: 2.35 mg/dL — ABNORMAL HIGH (ref 0.44–1.00)
GFR, Estimated: 20 mL/min — ABNORMAL LOW (ref >60)
Glucose, Bld: 157 mg/dL — ABNORMAL HIGH (ref 70–99)
Potassium: 4.4 mmol/L (ref 3.5–5.1)
Sodium: 137 mmol/L (ref 135–145)
Total Bilirubin: 0.8 mg/dL (ref 0.3–1.2)
Total Protein: 6 g/dL — ABNORMAL LOW (ref 6.5–8.1)

## 2021-01-18 LAB — RESP PANEL BY RT-PCR (FLU A&B, COVID) ARPGX2
Influenza A by PCR: NEGATIVE
Influenza B by PCR: NEGATIVE
SARS Coronavirus 2 by RT PCR: NEGATIVE

## 2021-01-18 LAB — TYPE AND SCREEN
ABO/RH(D): A POS
Antibody Screen: NEGATIVE

## 2021-01-18 LAB — LIPASE, BLOOD: Lipase: 49 U/L (ref 11–51)

## 2021-01-18 MED ORDER — AMIODARONE HCL 200 MG PO TABS
200.0000 mg | ORAL_TABLET | Freq: Every day | ORAL | Status: DC
  Administered 2021-01-19 – 2021-01-23 (×5): 200 mg via ORAL
  Filled 2021-01-18 (×5): qty 1

## 2021-01-18 MED ORDER — SODIUM CHLORIDE 0.9 % IV SOLN
1.0000 g | INTRAVENOUS | Status: DC
  Administered 2021-01-18 – 2021-01-19 (×2): 1 g via INTRAVENOUS
  Filled 2021-01-18 (×2): qty 10

## 2021-01-18 MED ORDER — ONDANSETRON HCL 4 MG/2ML IJ SOLN
4.0000 mg | Freq: Once | INTRAMUSCULAR | Status: AC
  Administered 2021-01-18: 4 mg via INTRAVENOUS
  Filled 2021-01-18: qty 2

## 2021-01-18 MED ORDER — SODIUM CHLORIDE 0.9 % IV SOLN: INTRAVENOUS | Status: AC

## 2021-01-18 MED ORDER — ROSUVASTATIN CALCIUM 5 MG PO TABS: 10.0000 mg | ORAL_TABLET | Freq: Every day | ORAL | Status: DC

## 2021-01-18 MED ORDER — APIXABAN 2.5 MG PO TABS
2.5000 mg | ORAL_TABLET | Freq: Two times a day (BID) | ORAL | Status: DC
  Administered 2021-01-18: 2.5 mg via ORAL
  Filled 2021-01-18: qty 1

## 2021-01-18 MED ORDER — VITAMIN B-12 100 MCG PO TABS
500.0000 ug | ORAL_TABLET | Freq: Every day | ORAL | Status: DC
  Administered 2021-01-19 – 2021-01-31 (×13): 500 ug via ORAL
  Filled 2021-01-18 (×13): qty 5

## 2021-01-18 MED ORDER — MORPHINE SULFATE (PF) 2 MG/ML IV SOLN
2.0000 mg | Freq: Once | INTRAVENOUS | Status: AC
  Administered 2021-01-18: 2 mg via INTRAVENOUS
  Filled 2021-01-18: qty 1

## 2021-01-18 MED ORDER — SODIUM CHLORIDE 0.9 % IV BOLUS
1000.0000 mL | Freq: Once | INTRAVENOUS | Status: AC
Start: 2021-01-18 — End: 2021-01-19
  Administered 2021-01-18: 1000 mL via INTRAVENOUS

## 2021-01-18 MED ORDER — FERROUS SULFATE 325 (65 FE) MG PO TABS
325.0000 mg | ORAL_TABLET | Freq: Every day | ORAL | Status: DC
  Administered 2021-01-18 – 2021-01-31 (×14): 325 mg via ORAL
  Filled 2021-01-18 (×14): qty 1
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
## 2021-01-18 NOTE — ED Notes (Signed)
Received verbal report from [facility] [place] at this time

## 2021-01-18 NOTE — ED Triage Notes (Signed)
Pt here Via EMS d/t  reported elevated BUN of 119.3. Also complains of abdominal pain, buttock pain d/t bed sores. Also wound on top of head noted during triage.  Pt grimacing and visible uncomfortable.  Pain rated at 7/10  103/55 HR 70 100% RA CBG 220 97.6 T

## 2021-01-18 NOTE — ED Notes (Signed)
Attempted to call report advised nurse not assigned someone would call back

## 2021-01-18 NOTE — ED Notes (Signed)
Patient transported to CT 

## 2021-01-18 NOTE — ED Notes (Signed)
Pt moved to room

## 2021-01-18 NOTE — ED Notes (Signed)
While giving pt po meds noted pt not able to drink out of a cup with a straw. Had to provide small sips of water in a medicine cup and provide water that way for her to take po meds. Provider sent a message and made aware of same
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
## 2021-01-19 ENCOUNTER — Other Ambulatory Visit: Payer: Self-pay

## 2021-01-19 DIAGNOSIS — E1122 Type 2 diabetes mellitus with diabetic chronic kidney disease: Secondary | ICD-10-CM | POA: Diagnosis present

## 2021-01-19 DIAGNOSIS — I330 Acute and subacute infective endocarditis: Secondary | ICD-10-CM | POA: Diagnosis present

## 2021-01-19 DIAGNOSIS — D631 Anemia in chronic kidney disease: Secondary | ICD-10-CM | POA: Diagnosis present

## 2021-01-19 DIAGNOSIS — N189 Chronic kidney disease, unspecified: Secondary | ICD-10-CM | POA: Diagnosis not present

## 2021-01-19 DIAGNOSIS — L89313 Pressure ulcer of right buttock, stage 3: Secondary | ICD-10-CM | POA: Diagnosis present

## 2021-01-19 DIAGNOSIS — I350 Nonrheumatic aortic (valve) stenosis: Secondary | ICD-10-CM | POA: Diagnosis present

## 2021-01-19 DIAGNOSIS — Z6831 Body mass index (BMI) 31.0-31.9, adult: Secondary | ICD-10-CM | POA: Diagnosis not present

## 2021-01-19 DIAGNOSIS — Z952 Presence of prosthetic heart valve: Secondary | ICD-10-CM | POA: Diagnosis not present

## 2021-01-19 DIAGNOSIS — L89153 Pressure ulcer of sacral region, stage 3: Secondary | ICD-10-CM | POA: Diagnosis present

## 2021-01-19 DIAGNOSIS — G9340 Encephalopathy, unspecified: Secondary | ICD-10-CM | POA: Diagnosis not present

## 2021-01-19 DIAGNOSIS — I130 Hypertensive heart and chronic kidney disease with heart failure and stage 1 through stage 4 chronic kidney disease, or unspecified chronic kidney disease: Secondary | ICD-10-CM | POA: Diagnosis present

## 2021-01-19 DIAGNOSIS — N179 Acute kidney failure, unspecified: Secondary | ICD-10-CM | POA: Diagnosis present

## 2021-01-19 DIAGNOSIS — I5042 Chronic combined systolic (congestive) and diastolic (congestive) heart failure: Secondary | ICD-10-CM | POA: Diagnosis present

## 2021-01-19 DIAGNOSIS — E870 Hyperosmolality and hypernatremia: Secondary | ICD-10-CM | POA: Diagnosis present

## 2021-01-19 DIAGNOSIS — N390 Urinary tract infection, site not specified: Secondary | ICD-10-CM | POA: Diagnosis present

## 2021-01-19 DIAGNOSIS — I2510 Atherosclerotic heart disease of native coronary artery without angina pectoris: Secondary | ICD-10-CM | POA: Diagnosis not present

## 2021-01-19 DIAGNOSIS — R7881 Bacteremia: Secondary | ICD-10-CM | POA: Diagnosis present

## 2021-01-19 DIAGNOSIS — D539 Nutritional anemia, unspecified: Secondary | ICD-10-CM | POA: Diagnosis present

## 2021-01-19 DIAGNOSIS — Z20822 Contact with and (suspected) exposure to covid-19: Secondary | ICD-10-CM | POA: Diagnosis present

## 2021-01-19 DIAGNOSIS — I4821 Permanent atrial fibrillation: Secondary | ICD-10-CM | POA: Diagnosis present

## 2021-01-19 DIAGNOSIS — K7460 Unspecified cirrhosis of liver: Secondary | ICD-10-CM | POA: Diagnosis present

## 2021-01-19 DIAGNOSIS — Z66 Do not resuscitate: Secondary | ICD-10-CM | POA: Diagnosis present

## 2021-01-19 DIAGNOSIS — I5022 Chronic systolic (congestive) heart failure: Secondary | ICD-10-CM | POA: Diagnosis not present

## 2021-01-19 DIAGNOSIS — I5043 Acute on chronic combined systolic (congestive) and diastolic (congestive) heart failure: Secondary | ICD-10-CM | POA: Diagnosis not present

## 2021-01-19 DIAGNOSIS — Z7189 Other specified counseling: Secondary | ICD-10-CM | POA: Diagnosis not present

## 2021-01-19 DIAGNOSIS — I4819 Other persistent atrial fibrillation: Secondary | ICD-10-CM | POA: Diagnosis not present

## 2021-01-19 DIAGNOSIS — G9341 Metabolic encephalopathy: Secondary | ICD-10-CM | POA: Diagnosis present

## 2021-01-19 DIAGNOSIS — N184 Chronic kidney disease, stage 4 (severe): Secondary | ICD-10-CM | POA: Diagnosis present

## 2021-01-19 DIAGNOSIS — G928 Other toxic encephalopathy: Secondary | ICD-10-CM | POA: Diagnosis present

## 2021-01-19 DIAGNOSIS — Z515 Encounter for palliative care: Secondary | ICD-10-CM | POA: Diagnosis not present

## 2021-01-19 DIAGNOSIS — I340 Nonrheumatic mitral (valve) insufficiency: Secondary | ICD-10-CM | POA: Diagnosis not present

## 2021-01-19 DIAGNOSIS — E1165 Type 2 diabetes mellitus with hyperglycemia: Secondary | ICD-10-CM | POA: Diagnosis present

## 2021-01-19 DIAGNOSIS — N139 Obstructive and reflux uropathy, unspecified: Secondary | ICD-10-CM | POA: Diagnosis not present

## 2021-01-19 DIAGNOSIS — E46 Unspecified protein-calorie malnutrition: Secondary | ICD-10-CM | POA: Diagnosis present

## 2021-01-19 DIAGNOSIS — I10 Essential (primary) hypertension: Secondary | ICD-10-CM | POA: Diagnosis not present

## 2021-01-19 DIAGNOSIS — I5023 Acute on chronic systolic (congestive) heart failure: Secondary | ICD-10-CM | POA: Diagnosis not present

## 2021-01-19 LAB — GLUCOSE, CAPILLARY
Glucose-Capillary: 116 mg/dL — ABNORMAL HIGH (ref 70–99)
Glucose-Capillary: 121 mg/dL — ABNORMAL HIGH (ref 70–99)
Glucose-Capillary: 150 mg/dL — ABNORMAL HIGH (ref 70–99)

## 2021-01-19 LAB — CBC
HCT: 25.2 % — ABNORMAL LOW (ref 36.0–46.0)
Hemoglobin: 7.9 g/dL — ABNORMAL LOW (ref 12.0–15.0)
MCH: 31.5 pg (ref 26.0–34.0)
MCHC: 31.3 g/dL (ref 30.0–36.0)
MCV: 100.4 fL — ABNORMAL HIGH (ref 80.0–100.0)
Platelets: 138 10*3/uL — ABNORMAL LOW (ref 150–400)
RBC: 2.51 MIL/uL — ABNORMAL LOW (ref 3.87–5.11)
RDW: 17.5 % — ABNORMAL HIGH (ref 11.5–15.5)
WBC: 6.8 10*3/uL (ref 4.0–10.5)
nRBC: 0 % (ref 0.0–0.2)

## 2021-01-19 LAB — COMPREHENSIVE METABOLIC PANEL
ALT: 101 U/L — ABNORMAL HIGH (ref 0–44)
AST: 110 U/L — ABNORMAL HIGH (ref 15–41)
Albumin: 1.8 g/dL — ABNORMAL LOW (ref 3.5–5.0)
Alkaline Phosphatase: 88 U/L (ref 38–126)
Anion gap: 6 (ref 5–15)
BUN: 125 mg/dL — ABNORMAL HIGH (ref 8–23)
CO2: 23 mmol/L (ref 22–32)
Calcium: 9.2 mg/dL (ref 8.9–10.3)
Chloride: 111 mmol/L (ref 98–111)
Creatinine, Ser: 2.18 mg/dL — ABNORMAL HIGH (ref 0.44–1.00)
GFR, Estimated: 22 mL/min — ABNORMAL LOW (ref >60)
Glucose, Bld: 126 mg/dL — ABNORMAL HIGH (ref 70–99)
Potassium: 4.2 mmol/L (ref 3.5–5.1)
Sodium: 140 mmol/L (ref 135–145)
Total Bilirubin: 0.8 mg/dL (ref 0.3–1.2)
Total Protein: 5.3 g/dL — ABNORMAL LOW (ref 6.5–8.1)

## 2021-01-19 LAB — HEPATITIS PANEL, ACUTE
HCV Ab: NONREACTIVE
Hep A IgM: NONREACTIVE
Hep B C IgM: NONREACTIVE
Hepatitis B Surface Ag: NONREACTIVE

## 2021-01-19 LAB — PROTIME-INR
INR: 1.8 — ABNORMAL HIGH (ref 0.8–1.2)
Prothrombin Time: 21.1 seconds — ABNORMAL HIGH (ref 11.4–15.2)

## 2021-01-19 LAB — IRON AND TIBC
Iron: 22 ug/dL — ABNORMAL LOW (ref 28–170)
Saturation Ratios: 11 % (ref 10.4–31.8)
TIBC: 199 ug/dL — ABNORMAL LOW (ref 250–450)
UIBC: 177 ug/dL

## 2021-01-19 LAB — RETICULOCYTES
Immature Retic Fract: 12.1 % (ref 2.3–15.9)
RBC.: 2.58 MIL/uL — ABNORMAL LOW (ref 3.87–5.11)
Retic Count, Absolute: 149.6 10*3/uL (ref 19.0–186.0)
Retic Ct Pct: 5.8 % — ABNORMAL HIGH (ref 0.4–3.1)

## 2021-01-19 LAB — FERRITIN: Ferritin: 262 ng/mL (ref 11–307)

## 2021-01-19 LAB — AMMONIA: Ammonia: 13 umol/L (ref 9–35)

## 2021-01-19 LAB — FOLATE: Folate: 16.8 ng/mL (ref >5.9)

## 2021-01-19 LAB — VITAMIN B12: Vitamin B-12: >7500 pg/mL — ABNORMAL HIGH (ref 180–914)

## 2021-01-19 MED ORDER — HEPARIN (PORCINE) 25000 UT/250ML-% IV SOLN: 900.0000 [IU]/h | INTRAVENOUS | Status: DC

## 2021-01-19 MED ORDER — CHLORHEXIDINE GLUCONATE CLOTH 2 % EX PADS
6.0000 | MEDICATED_PAD | Freq: Every day | CUTANEOUS | Status: DC
  Administered 2021-01-19 – 2021-01-31 (×14): 6 via TOPICAL

## 2021-01-19 MED ORDER — APIXABAN 2.5 MG PO TABS
2.5000 mg | ORAL_TABLET | Freq: Two times a day (BID) | ORAL | Status: DC
  Administered 2021-01-19 – 2021-01-25 (×12): 2.5 mg via ORAL
  Filled 2021-01-19 (×13): qty 1

## 2021-01-19 NOTE — Progress Notes (Signed)
PROGRESS NOTE    Carrie Monroe  XQJ:[id number] DOB: December 17, 1935 DOA: 01/18/2021 PCP: Ginger Organ., MD    Brief Narrative:  85 year old female with history of congestive heart failure, coronary artery disease status post stenting, atrial fibrillation tachybradycardia syndrome, diabetes, chronic kidney disease and anemia recently admitted to the hospital with weakness and pseudogout of the knee was discharged to a skilled nursing rehab 9 days ago brought back to the emergency room with elevated creatinine on routine exam at the skilled nursing facility.  Patient is poor historian.  Patient's daughter tells me that she had been looking weak and tired however she has not noticed anything new.  In the emergency room CT scan abdomen pelvis showed prominent distention of the bladder with bilateral hydronephrosis and hydroureter, changes of hepatic cirrhosis with prominent splenic vein varices and splenomegaly.  Creatinine 2.3 and BUN 137.  A Foley catheter was placed.  Admitted for further work-up.   Assessment & Plan:   Principal Problem:   ARF (acute renal failure) (HCC) Active Problems:   Essential hypertension   CAD- severe 3V CAD    Three-vessel CAD-PCI to LAD and LCx, med management of RCA   S/P TAVR (transcatheter aortic valve replacement)   Aortic stenosis, severe   Persistent atrial fibrillation (HCC)   Acute encephalopathy  Acute kidney injury on chronic kidney disease stage IV: Baseline creatinine about 1.5.  Probably postobstructive and dehydration.  Continue Foley catheter.  Gentle hydration.  Avoiding diuretics.  Acute metabolic encephalopathy in a patient with multiple medical problems: Likely metabolic from uremia.  Urea 135 on presentation.  Patient is making urine.  Ammonia is normal.  Continue to monitor.  Elevated LFTs with CT scan concerning for hepatic cirrhosis: Hepatitis panel.  Dopplers of the portal vein.  Mild abnormal LFTs, can continue statin at this  time.  If worsen, will consider stopping amiodarone.  Possible UTI and urinary retention: Treating with Rocephin until final cultures.  Chronic A. fib on amiodarone and Eliquis: Continue amiodarone.  Therapeutic on Eliquis.  Repeat EKG consistent with chronic left bundle branch block pattern.  Chronic combined heart failure: Without exacerbation.  Euvolemic.  Treating with IV fluid today.  Patient is fairly lethargic, unable to eat well.  Needing IV antibiotics and IV fluids.  Will need to stay in the hospital until clinical improvement.   DVT prophylaxis: apixaban (ELIQUIS) tablet 2.5 mg Start: 01/19/21 1200 apixaban (ELIQUIS) tablet 2.5 mg   Code Status: DNR Family Communication: Daughter at the bedside Disposition Plan: Status is: Observation  The patient will require care spanning > 2 midnights and should be moved to inpatient because: IV treatments appropriate due to intensity of illness or inability to take PO and Inpatient level of care appropriate due to severity of illness  Dispo: The patient is from: SNF              Anticipated d/c is to: SNF              Patient currently is not medically stable to d/c.   Difficult to place patient No         Consultants:  None  Procedures:  None  Antimicrobials:  Rocephin 8/24---   Subjective: Patient seen and examined.  Early morning rounds she was so sleepy and not able to have much conversation. Went back to talk to the patient with her daughter at the bedside, patient was sitting upright, closing her eyes and not participating much.  Her daughter thinks  this is her baseline.  Objective: Vitals:   01/19/21 0030 01/19/21 0115 01/19/21 0142 01/19/21 0417  BP: (!) 124/45 (!) 104/47 (!) 128/55 (!) 121/43  Pulse: 77 74 79 77  Resp: 19 18 20 18   Temp:   99 F (37.2 C)   TempSrc:   Axillary   SpO2: 94% 94%  99%  Weight:      Height:        Intake/Output Summary (Last 24 hours) at 01/19/2021 1350 Last data filed at  01/19/2021 1147 Gross per 24 hour  Intake 6425.14 ml  Output 4150 ml  Net 2275.14 ml   Filed Weights   01/18/21 1747  Weight: 77.4 kg    Examination:  General: Frail debilitated and chronically sick looking.  Not participating much.  Looks comfortable on room air. Cardiovascular: S1-S2 normal.  Regular rate rhythm. Respiratory: Bilateral clear.  No added sounds. Gastrointestinal: Soft and nontender. Ext: No edema or cyanosis. Neuro:  Sleepy.  Lethargic.  Tired.  Not very interactive. Moves all extremities but extremely weak.     Data Reviewed: I have personally reviewed following labs and imaging studies  CBC: Recent Labs  Lab 01/18/21 1827 01/19/21 0217  WBC 10.4 6.8  HGB 9.5* 7.9*  HCT 30.5* 25.2*  MCV 100.3* 100.4*  PLT 171 102*   Basic Metabolic Panel: Recent Labs  Lab 01/18/21 1827 01/19/21 0217  NA 137 140  K 4.4 4.2  CL 104 111  CO2 23 23  GLUCOSE 157* 126*  BUN 137* 125*  CREATININE 2.35* 2.18*  CALCIUM 9.6 9.2   GFR: Estimated Creatinine Clearance: 19.2 mL/min (A) (by C-G formula based on SCr of 2.18 mg/dL (H)). Liver Function Tests: Recent Labs  Lab 01/18/21 1827 01/19/21 0217  AST 106* 110*  ALT 104* 101*  ALKPHOS 108 88  BILITOT 0.8 0.8  PROT 6.0* 5.3*  ALBUMIN 2.1* 1.8*   Recent Labs  Lab 01/18/21 1827  LIPASE 49   Recent Labs  Lab 01/19/21 0217  AMMONIA 13   Coagulation Profile: Recent Labs  Lab 01/19/21 0217  INR 1.8*   Cardiac Enzymes: No results for input(s): CKTOTAL, CKMB, CKMBINDEX, TROPONINI in the last 168 hours. BNP (last 3 results) No results for input(s): PROBNP in the last 8760 hours. HbA1C: No results for input(s): HGBA1C in the last 72 hours. CBG: Recent Labs  Lab 01/19/21 1146  GLUCAP 116*   Lipid Profile: No results for input(s): CHOL, HDL, LDLCALC, TRIG, CHOLHDL, LDLDIRECT in the last 72 hours. Thyroid Function Tests: No results for input(s): TSH, T4TOTAL, FREET4, T3FREE, THYROIDAB in the  last 72 hours. Anemia Panel: Recent Labs    01/19/21 0217  VITAMINB12 >7,500*  FOLATE 16.8  FERRITIN 262  TIBC 199*  IRON 22*  RETICCTPCT 5.8*   Sepsis Labs: No results for input(s): PROCALCITON, LATICACIDVEN in the last 168 hours.  Recent Results (from the past 240 hour(s))  Resp Panel by RT-PCR (Flu A&B, Covid) Nasopharyngeal Swab     Status: None   Collection Time: 01/09/21  3:07 PM   Specimen: Nasopharyngeal Swab; Nasopharyngeal(NP) swabs in vial transport medium  Result Value Ref Range Status   SARS Coronavirus 2 by RT PCR NEGATIVE NEGATIVE Final    Comment: (NOTE) SARS-CoV-2 target nucleic acids are NOT DETECTED.  The SARS-CoV-2 RNA is generally detectable in upper respiratory specimens during the acute phase of infection. The lowest concentration of SARS-CoV-2 viral copies this assay can detect is 138 copies/mL. A negative result does not preclude SARS-Cov-2  infection and should not be used as the sole basis for treatment or other patient management decisions. A negative result may occur with  improper specimen collection/handling, submission of specimen other than nasopharyngeal swab, presence of viral mutation(s) within the areas targeted by this assay, and inadequate number of viral copies(<138 copies/mL). A negative result must be combined with clinical observations, patient history, and epidemiological information. The expected result is Negative.  Fact Sheet for Patients:  EntrepreneurPulse.com.au  Fact Sheet for Healthcare Providers:  IncredibleEmployment.be  This test is no t yet approved or cleared by the [place] FDA and  has been authorized for detection and/or diagnosis of SARS-CoV-2 by FDA under an Emergency Use Authorization (EUA). This EUA will remain  in effect (meaning this test can be used) for the duration of the COVID-19 declaration under Section 564(b)(1) of the Act, 21 U.S.C.section 360bbb-3(b)(1),  unless the authorization is terminated  or revoked sooner.       Influenza A by PCR NEGATIVE NEGATIVE Final   Influenza B by PCR NEGATIVE NEGATIVE Final    Comment: (NOTE) The Xpert Xpress SARS-CoV-2/FLU/RSV plus assay is intended as an aid in the diagnosis of influenza from Nasopharyngeal swab specimens and should not be used as a sole basis for treatment. Nasal washings and aspirates are unacceptable for Xpert Xpress SARS-CoV-2/FLU/RSV testing.  Fact Sheet for Patients: EntrepreneurPulse.com.au  Fact Sheet for Healthcare Providers: IncredibleEmployment.be  This test is not yet approved or cleared by the [place] FDA and has been authorized for detection and/or diagnosis of SARS-CoV-2 by FDA under an Emergency Use Authorization (EUA). This EUA will remain in effect (meaning this test can be used) for the duration of the COVID-19 declaration under Section 564(b)(1) of the Act, 21 U.S.C. section 360bbb-3(b)(1), unless the authorization is terminated or revoked.  Performed at [facility], [place] [address], [place], [place] [address]   Resp Panel by RT-PCR (Flu A&B, Covid) Nasopharyngeal Swab     Status: None   Collection Time: 01/18/21  6:27 PM   Specimen: Nasopharyngeal Swab; Nasopharyngeal(NP) swabs in vial transport medium  Result Value Ref Range Status   SARS Coronavirus 2 by RT PCR NEGATIVE NEGATIVE Final    Comment: (NOTE) SARS-CoV-2 target nucleic acids are NOT DETECTED.  The SARS-CoV-2 RNA is generally detectable in upper respiratory specimens during the acute phase of infection. The lowest concentration of SARS-CoV-2 viral copies this assay can detect is 138 copies/mL. A negative result does not preclude SARS-Cov-2 infection and should not be used as the sole basis for treatment or other patient management decisions. A negative result may occur with  improper specimen collection/handling, submission of specimen  other than nasopharyngeal swab, presence of viral mutation(s) within the areas targeted by this assay, and inadequate number of viral copies(<138 copies/mL). A negative result must be combined with clinical observations, patient history, and epidemiological information. The expected result is Negative.  Fact Sheet for Patients:  EntrepreneurPulse.com.au  Fact Sheet for Healthcare Providers:  IncredibleEmployment.be  This test is no t yet approved or cleared by the [place] FDA and  has been authorized for detection and/or diagnosis of SARS-CoV-2 by FDA under an Emergency Use Authorization (EUA). This EUA will remain  in effect (meaning this test can be used) for the duration of the COVID-19 declaration under Section 564(b)(1) of the Act, 21 U.S.C.section 360bbb-3(b)(1), unless the authorization is terminated  or revoked sooner.       Influenza A by PCR NEGATIVE NEGATIVE Final  Influenza B by PCR NEGATIVE NEGATIVE Final    Comment: (NOTE) The Xpert Xpress SARS-CoV-2/FLU/RSV plus assay is intended as an aid in the diagnosis of influenza from Nasopharyngeal swab specimens and should not be used as a sole basis for treatment. Nasal washings and aspirates are unacceptable for Xpert Xpress SARS-CoV-2/FLU/RSV testing.  Fact Sheet for Patients: EntrepreneurPulse.com.au  Fact Sheet for Healthcare Providers: IncredibleEmployment.be  This test is not yet approved or cleared by the [place] FDA and has been authorized for detection and/or diagnosis of SARS-CoV-2 by FDA under an Emergency Use Authorization (EUA). This EUA will remain in effect (meaning this test can be used) for the duration of the COVID-19 declaration under Section 564(b)(1) of the Act, 21 U.S.C. section 360bbb-3(b)(1), unless the authorization is terminated or revoked.  Performed at [facility], [place] [address], [place],  [place] [address]          Radiology Studies: CT Abdomen Pelvis Wo Contrast  Result Date: 01/18/2021 CLINICAL DATA:  Acute abdominal pain, nonlocalized. Elevated BUN. Abdominal pain. Bedsores. EXAM: CT ABDOMEN AND PELVIS WITHOUT CONTRAST TECHNIQUE: Multidetector CT imaging of the abdomen and pelvis was performed following the standard protocol without IV contrast. COMPARISON:  06/26/2017 FINDINGS: Lower chest: Lung bases are clear. Multiple calcifications in the left breast with circumscribed peripherally calcified central lesion measuring 4.1 cm diameter. These changes appear to have been present on the previous study and likely related to treatment of previous left breast cancer. Cardiac enlargement with cardiac valve prosthesis. Hepatobiliary: Cirrhotic changes suggested in the liver with enlarged lateral segment left and caudate lobes and nodular contour to the liver. No focal liver abnormality is seen. Status post cholecystectomy. No biliary dilatation. Pancreas: Unremarkable. No pancreatic ductal dilatation or surrounding inflammatory changes. Spleen: Spleen is enlarged. Prominent splenic artery calcifications. Prominent splenic vein varices. Adrenals/Urinary Tract: No adrenal gland nodules. Bilateral hydronephrosis with significant hydroureter. The bladder is severely distended. Hydronephrosis and hydroureter likely result from reflux. No definite obstructing lesion. Small intrarenal stones are demonstrated bilaterally. Stomach/Bowel: Stomach, small bowel, and colon are mostly decompressed. The rectal wall appears thickened which may indicate proctitis. No pericolonic stranding. Appendix is not identified. Vascular/Lymphatic: Calcification of the aorta. No significant lymphadenopathy. Reproductive: Uterus and bilateral adnexa are unremarkable. Other: Small amount of free fluid in the pelvis.  No free air. Musculoskeletal: Degenerative changes in the spine and hips. No destructive bone lesions. Old rib  fractures. IMPRESSION: 1. Prominent distention of the bladder with bilateral hydronephrosis and hydroureter likely representing neurogenic bladder with bilateral reflux. 2. Small bilateral nonobstructing intrarenal stones. 3. Changes of hepatic cirrhosis with splenic enlargement and prominent splenic vein varices. 4. Aortic atherosclerosis. Electronically Signed   By: Lucienne Capers M.D.   On: 01/18/2021 19:25   CT HEAD WO CONTRAST (5MM)  Result Date: 01/18/2021 CLINICAL DATA:  Mental status changes EXAM: CT HEAD WITHOUT CONTRAST TECHNIQUE: Contiguous axial images were obtained from the base of the skull through the vertex without intravenous contrast. COMPARISON:  02/17/2019 FINDINGS: Brain: There is atrophy and chronic small vessel disease changes. No acute intracranial abnormality. Specifically, no hemorrhage, hydrocephalus, mass lesion, acute infarction, or significant intracranial injury. Vascular: No hyperdense vessel or unexpected calcification. Skull: No acute calvarial abnormality. Sinuses/Orbits: No acute findings Other: None IMPRESSION: Atrophy, chronic microvascular disease. No acute intracranial abnormality. Electronically Signed   By: Rolm Baptise M.D.   On: 01/18/2021 21:18        Scheduled Meds:  amiodarone  200 mg Oral Daily  apixaban  2.5 mg Oral BID   ferrous sulfate  325 mg Oral Daily   vitamin B-12  500 mcg Oral Daily   Continuous Infusions:  sodium chloride 75 mL/hr at 01/19/21 0027   cefTRIAXone (ROCEPHIN)  IV Stopped (01/19/21 0015)     LOS: 0 days    Time spent: 30 minutes    Barb Merino, MD [facility] Pager [phone]

## 2021-01-19 NOTE — ED Notes (Signed)
Attempted to call report at this time no answer

## 2021-01-19 NOTE — ED Notes (Signed)
Received message from provider and advised to keep pt NPO at this time.

## 2021-01-19 NOTE — Consult Note (Signed)
[facility] Nurse Consult Note: Patient receiving care in [place] Reason for Consult: Sacral wounds Wound type: Stage 3 sacral wounds x 2 with an island of skin separating the two. These have evolved from previous admission of MASD/IAD. Coccyx wound measures 1.5 cm x 3.5 cm x 0.2 cm 25% yellow, 75% pink/red moist surrounded by a border of maceration at the distal end of the wound.  Right buttock wound measures 2.5 cm x 1 cm x 0.2 cm 100% red/friable. Serous drainage on the foam dressing.  Pressure Injury POA: Yes Dressing procedure/placement/frequency: Cleanse the sacral area with soap and water, rinse and pat dry. Apply a small piece of Xeroform gauze over coccyx and buttock wound and secure with sacral foam dressing. Apply the sacral foam dressing with the point up to prevent soiling.  Order and place patient on standard size air mattress. Order chair cushion Kellie Simmering # [phone]) if up in chair. Turn q 4 and slide up in bed with draw sheet or mattress pad to prevent sheering.  Pressure Injury Prevention Bundle May use any that apply to this patient. Support surfaces (air mattress) chair cushion Kellie Simmering # [phone]) Heel offloading boots Kellie Simmering # [phone]) Turning and Positioning  Measures to reduce shear (draw sheet, knees up) Skin protection Products (Foam dressing) Moisture management products (Critic-Aid Barrier Cream (Purple top) Sween moisturizing lotion (Pink top in clean supply) Nutrition Management Protection for Medical Devices Routine Skin Assessment   Monitor the wound area(s) for worsening of condition such as: Signs/symptoms of infection, increase in size, development of or worsening of odor, development of pain, or increased pain at the affected locations.   Notify the medical team if any of these develop.  Thank you for the consult. [place] nurse will not follow at this time.   Please re-consult the [place] team if needed.  Cathlean Marseilles Tamala Julian, MSN, RN, [place], [place] [place], [facility] Pager [phone]
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
## 2021-01-19 NOTE — Progress Notes (Signed)
ANTICOAGULATION CONSULT NOTE - Initial Consult  Pharmacy Consult for Heparin Indication: atrial fibrillation  Allergies  Allergen Reactions   Oxycodone Other (See Comments)    Hallunication    Patient Measurements: Height: 5\' 2"  (157.5 cm) Weight: 77.4 kg (170 lb 10.2 oz) IBW/kg (Calculated) : 50.1 Heparin Dosing Weight: 65 kg  Vital Signs: Temp: 99 F (37.2 C) (08/25 0142) Temp Source: Axillary (08/25 0142) BP: 128/55 (08/25 0142) Pulse Rate: 79 (08/25 0142)  Labs: Recent Labs    01/18/21 1827  HGB 9.5*  HCT 30.5*  PLT 171  CREATININE 2.35*    Estimated Creatinine Clearance: 17.8 mL/min (A) (by C-G formula based on SCr of 2.35 mg/dL (H)).   Medical History: Past Medical History:  Diagnosis Date   Aortic stenosis, severe    a. 01/2017: s/p TAVR; hospital course complicated by large groin hematoma/wound   Arthritis    "fingers" (11/06/2017)   Cancer of left breast ([place]) 2009   s/p lumpectomy and XRT   Carotid artery stenosis    a. s/p R TCAR 02/2019 followed by [facility].   CKD (chronic kidney disease)    Coronary artery disease    a. 11/2016: diagnosed with multivessel CAD, turned down for CABG and underwent PCI/DES to mLCx, PCI/DES to mLAD and PCTA of ostial diagonal on 12/28/16   Diabetes mellitus type 2, diet-controlled ([place])    Exogenous obesity    Gout    "on daily RX" (11/06/2017)   Heart murmur    Hemorrhoids    HFmrEF (heart failure with mid-range ejection fraction) ([place])    a. 10/2019 Echo: EF 40-45%, glo HK. RVSP 51mmHg. Mildly reduced RV fxn. Sev BAE. Mild to mod MR. Nl fxn AoV prosthesis.   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   PRN   amiodarone (PACERONE) 200 MG tablet Take 1 tablet (200 mg total) by mouth daily. 90 tablet 1 01/18/2021   apixaban (ELIQUIS) 2.5 MG TABS tablet Take 1 tablet (2.5 mg total) by mouth 2 (two) times daily. 180 tablet 1 01/18/2021 at am   Ascorbic Acid (VITAMIN C) 1000 MG tablet Take 1,000 mg by mouth daily.   01/18/2021   Cholecalciferol (VITAMIN D) 50 MCG (2000 UT) tablet Take 2,000 Units by mouth daily.   01/18/2021   colchicine 0.6 MG tablet Take 0.5 tablets (0.3 mg total) by mouth daily. (Patient taking differently: Take 0.3 mg by mouth daily as needed (gout flare-ups).) 15 tablet 0 Past Week   COLLAGEN PO Take 5 g by mouth daily. power   01/18/2021   Cyanocobalamin (VITAMIN B 12 PO) Take 1 Dose by mouth daily. liquid   01/18/2021   diclofenac Sodium (VOLTAREN) 1 % GEL Apply 2 g topically 4 (four) times daily. Tender joints 50 g 0 Past Week  feeding supplement (ENSURE ENLIVE / ENSURE PLUS) LIQD Take 237 mLs by mouth 2 (two) times daily between meals. 14220 mL 0 01/18/2021   ferrous sulfate 325 (65 FE) MG EC tablet Take 325 mg by mouth daily.   01/18/2021   furosemide (LASIX) 40 MG tablet Take 40 mg by mouth daily.   01/18/2021   Multiple Vitamin (MULTIVITAMIN WITH MINERALS) TABS tablet Take 1 tablet by mouth daily. 30 tablet 0 01/18/2021   rosuvastatin (CRESTOR) 10 MG tablet Take 10 mg by mouth daily.   01/18/2021   traMADol (ULTRAM) 50 MG tablet Take 0.5 tablets (25 mg total) by mouth every 6 (six) hours as needed for moderate pain or severe pain. 10 tablet 0 01/18/2021    Assessment: 85 y.o. female with h/o Afib, Eliquis on hold due to swallowing difficulties, for  heparin.  Last dose of Eliquis at 2345  Goal of Therapy:  aPTT 66-102 sec Heparin level 0.3-0.7 units/mL Monitor platelets by anticoagulation protocol: Yes   Plan:  Start heparin 900 units/hr at noon today aPTT in 8 hours  [name] 01/19/2021,2:05 AM

## 2021-01-20 DIAGNOSIS — N179 Acute kidney failure, unspecified: Secondary | ICD-10-CM | POA: Diagnosis not present

## 2021-01-20 DIAGNOSIS — G9340 Encephalopathy, unspecified: Secondary | ICD-10-CM

## 2021-01-20 DIAGNOSIS — I10 Essential (primary) hypertension: Secondary | ICD-10-CM | POA: Diagnosis not present

## 2021-01-20 DIAGNOSIS — I4819 Other persistent atrial fibrillation: Secondary | ICD-10-CM

## 2021-01-20 DIAGNOSIS — Z952 Presence of prosthetic heart valve: Secondary | ICD-10-CM

## 2021-01-20 LAB — CBC WITH DIFFERENTIAL/PLATELET
Abs Immature Granulocytes: 0.03 10*3/uL (ref 0.00–0.07)
Basophils Absolute: 0 10*3/uL (ref 0.0–0.1)
Basophils Relative: 0 %
Eosinophils Absolute: 0.1 10*3/uL (ref 0.0–0.5)
Eosinophils Relative: 1 %
HCT: 24.1 % — ABNORMAL LOW (ref 36.0–46.0)
Hemoglobin: 7.3 g/dL — ABNORMAL LOW (ref 12.0–15.0)
Immature Granulocytes: 1 %
Lymphocytes Relative: 10 %
Lymphs Abs: 0.6 10*3/uL — ABNORMAL LOW (ref 0.7–4.0)
MCH: 30.8 pg (ref 26.0–34.0)
MCHC: 30.3 g/dL (ref 30.0–36.0)
MCV: 101.7 fL — ABNORMAL HIGH (ref 80.0–100.0)
Monocytes Absolute: 0.6 10*3/uL (ref 0.1–1.0)
Monocytes Relative: 10 %
Neutro Abs: 4.5 10*3/uL (ref 1.7–7.7)
Neutrophils Relative %: 78 %
Platelets: 112 10*3/uL — ABNORMAL LOW (ref 150–400)
RBC: 2.37 MIL/uL — ABNORMAL LOW (ref 3.87–5.11)
RDW: 17.3 % — ABNORMAL HIGH (ref 11.5–15.5)
WBC: 5.8 10*3/uL (ref 4.0–10.5)
nRBC: 0 % (ref 0.0–0.2)

## 2021-01-20 LAB — GLUCOSE, CAPILLARY
Glucose-Capillary: 119 mg/dL — ABNORMAL HIGH (ref 70–99)
Glucose-Capillary: 146 mg/dL — ABNORMAL HIGH (ref 70–99)
Glucose-Capillary: 162 mg/dL — ABNORMAL HIGH (ref 70–99)
Glucose-Capillary: 183 mg/dL — ABNORMAL HIGH (ref 70–99)
Glucose-Capillary: 186 mg/dL — ABNORMAL HIGH (ref 70–99)
Glucose-Capillary: 194 mg/dL — ABNORMAL HIGH (ref 70–99)
Glucose-Capillary: 211 mg/dL — ABNORMAL HIGH (ref 70–99)

## 2021-01-20 LAB — MAGNESIUM: Magnesium: 2.4 mg/dL (ref 1.7–2.4)

## 2021-01-20 LAB — BASIC METABOLIC PANEL
Anion gap: 4 — ABNORMAL LOW (ref 5–15)
BUN: 98 mg/dL — ABNORMAL HIGH (ref 8–23)
CO2: 24 mmol/L (ref 22–32)
Calcium: 8.9 mg/dL (ref 8.9–10.3)
Chloride: 118 mmol/L — ABNORMAL HIGH (ref 98–111)
Creatinine, Ser: 1.72 mg/dL — ABNORMAL HIGH (ref 0.44–1.00)
GFR, Estimated: 29 mL/min — ABNORMAL LOW (ref >60)
Glucose, Bld: 163 mg/dL — ABNORMAL HIGH (ref 70–99)
Potassium: 3.4 mmol/L — ABNORMAL LOW (ref 3.5–5.1)
Sodium: 146 mmol/L — ABNORMAL HIGH (ref 135–145)

## 2021-01-20 LAB — PHOSPHORUS: Phosphorus: 3.3 mg/dL (ref 2.5–4.6)

## 2021-01-20 MED ORDER — SODIUM CHLORIDE 0.9 % IV SOLN
2.0000 g | INTRAVENOUS | Status: DC
  Administered 2021-01-20 – 2021-01-22 (×3): 2 g via INTRAVENOUS
  Filled 2021-01-20 (×3): qty 2

## 2021-01-20 MED ORDER — SODIUM CHLORIDE 0.45 % IV SOLN: INTRAVENOUS | Status: DC

## 2021-01-20 NOTE — Discharge Instructions (Signed)

## 2021-01-20 NOTE — Progress Notes (Signed)
PROGRESS NOTE    Carrie Monroe  FBP:[id number] DOB: 16-Jun-1935 DOA: 01/18/2021 PCP: Ginger Organ., MD    Brief Narrative:  85 year old female with history of congestive heart failure, coronary artery disease status post stenting, atrial fibrillation tachybradycardia syndrome, diabetes, chronic kidney disease and anemia recently admitted to the hospital with weakness and pseudogout of the knee was discharged to a skilled nursing rehab 9 days ago brought back to the emergency room with elevated creatinine on routine exam at the skilled nursing facility.  Patient is poor historian.  Patient's daughter tells me that she had been looking weak and tired however she has not noticed anything new.  In the emergency room CT scan abdomen pelvis showed prominent distention of the bladder with bilateral hydronephrosis and hydroureter, changes of hepatic cirrhosis with prominent splenic vein varices and splenomegaly.  Creatinine 2.3 and BUN 137.  A Foley catheter was placed.  Admitted for further work-up.   Assessment & Plan:   Principal Problem:   ARF (acute renal failure) (HCC) Active Problems:   Essential hypertension   CAD- severe 3V CAD    Three-vessel CAD-PCI to LAD and LCx, med management of RCA   S/P TAVR (transcatheter aortic valve replacement)   Aortic stenosis, severe   Persistent atrial fibrillation (HCC)   AKI (acute kidney injury) ([place])   Acute encephalopathy  Acute kidney injury on chronic kidney disease stage IV: Baseline creatinine about 1.5.  Probably postobstructive and dehydration.  Continue Foley catheter.  Gentle hydration.  Avoiding diuretics.  Acute metabolic encephalopathy in a patient with multiple medical problems: Likely metabolic from uremia.  Urea 135 on presentation.  Trending down with IV fluids.  Patient is making urine.  Ammonia is normal.  Continue to monitor. B 12 and folic acid is normal.  Urinalysis with more than 80,000 Pseudomonas, will change to  cefepime.  CT head was without any acute findings.  With persistent confusion, will check MRI of the brain.  Elevated LFTs with CT scan concerning for hepatic cirrhosis: Hepatitis panel negative.  Dopplers of the portal vein pending.  Mild abnormal LFTs, can continue statin at this time.  If worsen, will consider stopping amiodarone. Compensated.  Ammonia normal.  Acute UTI present on admission: Growing Pseudomonas.  Will treat with cefepime until final cultures.  Chronic A. fib on amiodarone and Eliquis: Continue amiodarone.  Therapeutic on Eliquis.  Repeat EKG consistent with chronic left bundle branch block pattern.  Chronic combined heart failure: Without exacerbation.  Euvolemic.  Treating with IV fluid today.  MRI today.  Work with PT OT.   DVT prophylaxis: apixaban (ELIQUIS) tablet 2.5 mg Start: 01/19/21 1200 apixaban (ELIQUIS) tablet 2.5 mg   Code Status: DNR Family Communication: Called patient's daughter and daughter-in-law, unable to talk.  We will try again. Disposition Plan: Status is: Inpatient.  Remains inpatient with persistent lethargic, altered mental status.  Dispo: The patient is from: SNF              Anticipated d/c is to: SNF              Patient currently is not medically stable to d/c.   Difficult to place patient No         Consultants:  None  Procedures:  None  Antimicrobials:  Rocephin 8/24--- 8/25 Cefepime 8/26--   Subjective: Patient seen and examined.  No overnight events.  Today she was more awake, she was tearful.  She wanted to meet her family. Patient herself is  poor historian.  She says she is fine but just missing her family.  Denies any nausea vomiting or abdominal pain.  She is afebrile.  Objective: Vitals:   01/20/21 0405 01/20/21 0737 01/20/21 0900 01/20/21 1149  BP: (!) 128/52 (!) 133/48  128/75  Pulse: 76 67 69 71  Resp: 20 17 17 20   Temp: 99 F (37.2 C) 98.8 F (37.1 C)  98.9 F (37.2 C)  TempSrc: Oral Oral  Oral   SpO2: 97% 93% 95% 99%  Weight:      Height:        Intake/Output Summary (Last 24 hours) at 01/20/2021 1340 Last data filed at 01/20/2021 0900 Gross per 24 hour  Intake 1890 ml  Output 975 ml  Net 915 ml   Filed Weights   01/18/21 1747  Weight: 77.4 kg    Examination:  General: Frail debilitated and chronically sick looking.  Not participating much.  Looks comfortable on room air. Cardiovascular: S1-S2 normal.  Regular rate rhythm. Respiratory: Bilateral clear.  No added sounds. Gastrointestinal: Soft and nontender. Ext: No edema or cyanosis. Neuro:  Lethargic.  Tired.  Not very interactive. Moves all extremities but extremely weak. Foley catheter with clear urine.     Data Reviewed: I have personally reviewed following labs and imaging studies  CBC: Recent Labs  Lab 01/18/21 1827 01/19/21 0217 01/20/21 0139  WBC 10.4 6.8 5.8  NEUTROABS  --   --  4.5  HGB 9.5* 7.9* 7.3*  HCT 30.5* 25.2* 24.1*  MCV 100.3* 100.4* 101.7*  PLT 171 138* 956*   Basic Metabolic Panel: Recent Labs  Lab 01/18/21 1827 01/19/21 0217 01/20/21 0139  NA 137 140 146*  K 4.4 4.2 3.4*  CL 104 111 118*  CO2 23 23 24   GLUCOSE 157* 126* 163*  BUN 137* 125* 98*  CREATININE 2.35* 2.18* 1.72*  CALCIUM 9.6 9.2 8.9  MG  --   --  2.4  PHOS  --   --  3.3   GFR: Estimated Creatinine Clearance: 24.3 mL/min (A) (by C-G formula based on SCr of 1.72 mg/dL (H)). Liver Function Tests: Recent Labs  Lab 01/18/21 1827 01/19/21 0217  AST 106* 110*  ALT 104* 101*  ALKPHOS 108 88  BILITOT 0.8 0.8  PROT 6.0* 5.3*  ALBUMIN 2.1* 1.8*   Recent Labs  Lab 01/18/21 1827  LIPASE 49   Recent Labs  Lab 01/19/21 0217  AMMONIA 13   Coagulation Profile: Recent Labs  Lab 01/19/21 0217  INR 1.8*   Cardiac Enzymes: No results for input(s): CKTOTAL, CKMB, CKMBINDEX, TROPONINI in the last 168 hours. BNP (last 3 results) No results for input(s): PROBNP in the last 8760 hours. HbA1C: No results  for input(s): HGBA1C in the last 72 hours. CBG: Recent Labs  Lab 01/19/21 2053 01/20/21 0033 01/20/21 0403 01/20/21 0736 01/20/21 1148  GLUCAP 150* 194* 146* 119* 162*   Lipid Profile: No results for input(s): CHOL, HDL, LDLCALC, TRIG, CHOLHDL, LDLDIRECT in the last 72 hours. Thyroid Function Tests: No results for input(s): TSH, T4TOTAL, FREET4, T3FREE, THYROIDAB in the last 72 hours. Anemia Panel: Recent Labs    01/19/21 0217  VITAMINB12 >7,500*  FOLATE 16.8  FERRITIN 262  TIBC 199*  IRON 22*  RETICCTPCT 5.8*   Sepsis Labs: No results for input(s): PROCALCITON, LATICACIDVEN in the last 168 hours.  Recent Results (from the past 240 hour(s))  Resp Panel by RT-PCR (Flu A&B, Covid) Nasopharyngeal Swab     Status: None  Collection Time: 01/18/21  6:27 PM   Specimen: Nasopharyngeal Swab; Nasopharyngeal(NP) swabs in vial transport medium  Result Value Ref Range Status   SARS Coronavirus 2 by RT PCR NEGATIVE NEGATIVE Final    Comment: (NOTE) SARS-CoV-2 target nucleic acids are NOT DETECTED.  The SARS-CoV-2 RNA is generally detectable in upper respiratory specimens during the acute phase of infection. The lowest concentration of SARS-CoV-2 viral copies this assay can detect is 138 copies/mL. A negative result does not preclude SARS-Cov-2 infection and should not be used as the sole basis for treatment or other patient management decisions. A negative result may occur with  improper specimen collection/handling, submission of specimen other than nasopharyngeal swab, presence of viral mutation(s) within the areas targeted by this assay, and inadequate number of viral copies(<138 copies/mL). A negative result must be combined with clinical observations, patient history, and epidemiological information. The expected result is Negative.  Fact Sheet for Patients:  EntrepreneurPulse.com.au  Fact Sheet for Healthcare Providers:   IncredibleEmployment.be  This test is no t yet approved or cleared by the [place] FDA and  has been authorized for detection and/or diagnosis of SARS-CoV-2 by FDA under an Emergency Use Authorization (EUA). This EUA will remain  in effect (meaning this test can be used) for the duration of the COVID-19 declaration under Section 564(b)(1) of the Act, 21 U.S.C.section 360bbb-3(b)(1), unless the authorization is terminated  or revoked sooner.       Influenza A by PCR NEGATIVE NEGATIVE Final   Influenza B by PCR NEGATIVE NEGATIVE Final    Comment: (NOTE) The Xpert Xpress SARS-CoV-2/FLU/RSV plus assay is intended as an aid in the diagnosis of influenza from Nasopharyngeal swab specimens and should not be used as a sole basis for treatment. Nasal washings and aspirates are unacceptable for Xpert Xpress SARS-CoV-2/FLU/RSV testing.  Fact Sheet for Patients: EntrepreneurPulse.com.au  Fact Sheet for Healthcare Providers: IncredibleEmployment.be  This test is not yet approved or cleared by the [place] FDA and has been authorized for detection and/or diagnosis of SARS-CoV-2 by FDA under an Emergency Use Authorization (EUA). This EUA will remain in effect (meaning this test can be used) for the duration of the COVID-19 declaration under Section 564(b)(1) of the Act, 21 U.S.C. section 360bbb-3(b)(1), unless the authorization is terminated or revoked.  Performed at [facility], [place] [address] [place], [place] [address]   Urine Culture     Status: Abnormal (Preliminary result)   Collection Time: 01/18/21 11:10 PM   Specimen: Urine, Clean Catch  Result Value Ref Range Status   Specimen Description URINE, CLEAN CATCH  Final   Special Requests   Final    NONE Performed at [facility], [place] [address] [place], [place] [address]    Culture 80,000 COLONIES/mL PSEUDOMONAS AERUGINOSA (A)  Final   Report Status  PENDING  Incomplete         Radiology Studies: CT Abdomen Pelvis Wo Contrast  Result Date: 01/18/2021 CLINICAL DATA:  Acute abdominal pain, nonlocalized. Elevated BUN. Abdominal pain. Bedsores. EXAM: CT ABDOMEN AND PELVIS WITHOUT CONTRAST TECHNIQUE: Multidetector CT imaging of the abdomen and pelvis was performed following the standard protocol without IV contrast. COMPARISON:  06/26/2017 FINDINGS: Lower chest: Lung bases are clear. Multiple calcifications in the left breast with circumscribed peripherally calcified central lesion measuring 4.1 cm diameter. These changes appear to have been present on the previous study and likely related to treatment of previous left breast cancer. Cardiac enlargement with cardiac valve prosthesis. Hepatobiliary:  Cirrhotic changes suggested in the liver with enlarged lateral segment left and caudate lobes and nodular contour to the liver. No focal liver abnormality is seen. Status post cholecystectomy. No biliary dilatation. Pancreas: Unremarkable. No pancreatic ductal dilatation or surrounding inflammatory changes. Spleen: Spleen is enlarged. Prominent splenic artery calcifications. Prominent splenic vein varices. Adrenals/Urinary Tract: No adrenal gland nodules. Bilateral hydronephrosis with significant hydroureter. The bladder is severely distended. Hydronephrosis and hydroureter likely result from reflux. No definite obstructing lesion. Small intrarenal stones are demonstrated bilaterally. Stomach/Bowel: Stomach, small bowel, and colon are mostly decompressed. The rectal wall appears thickened which may indicate proctitis. No pericolonic stranding. Appendix is not identified. Vascular/Lymphatic: Calcification of the aorta. No significant lymphadenopathy. Reproductive: Uterus and bilateral adnexa are unremarkable. Other: Small amount of free fluid in the pelvis.  No free air. Musculoskeletal: Degenerative changes in the spine and hips. No destructive bone lesions. Old  rib fractures. IMPRESSION: 1. Prominent distention of the bladder with bilateral hydronephrosis and hydroureter likely representing neurogenic bladder with bilateral reflux. 2. Small bilateral nonobstructing intrarenal stones. 3. Changes of hepatic cirrhosis with splenic enlargement and prominent splenic vein varices. 4. Aortic atherosclerosis. Electronically Signed   By: Lucienne Capers M.D.   On: 01/18/2021 19:25   CT HEAD WO CONTRAST (5MM)  Result Date: 01/18/2021 CLINICAL DATA:  Mental status changes EXAM: CT HEAD WITHOUT CONTRAST TECHNIQUE: Contiguous axial images were obtained from the base of the skull through the vertex without intravenous contrast. COMPARISON:  02/17/2019 FINDINGS: Brain: There is atrophy and chronic small vessel disease changes. No acute intracranial abnormality. Specifically, no hemorrhage, hydrocephalus, mass lesion, acute infarction, or significant intracranial injury. Vascular: No hyperdense vessel or unexpected calcification. Skull: No acute calvarial abnormality. Sinuses/Orbits: No acute findings Other: None IMPRESSION: Atrophy, chronic microvascular disease. No acute intracranial abnormality. Electronically Signed   By: Rolm Baptise M.D.   On: 01/18/2021 21:18        Scheduled Meds:  amiodarone  200 mg Oral Daily   apixaban  2.5 mg Oral BID   Chlorhexidine Gluconate Cloth  6 each Topical Daily   ferrous sulfate  325 mg Oral Daily   vitamin B-12  500 mcg Oral Daily   Continuous Infusions:  sodium chloride     ceFEPime (MAXIPIME) IV 2 g (01/20/21 1205)     LOS: 1 day    Time spent: 30 minutes    Barb Merino, MD [facility] Pager [phone]

## 2021-01-21 DIAGNOSIS — I4819 Other persistent atrial fibrillation: Secondary | ICD-10-CM | POA: Diagnosis not present

## 2021-01-21 DIAGNOSIS — I10 Essential (primary) hypertension: Secondary | ICD-10-CM | POA: Diagnosis not present

## 2021-01-21 DIAGNOSIS — N179 Acute kidney failure, unspecified: Secondary | ICD-10-CM | POA: Diagnosis not present

## 2021-01-21 DIAGNOSIS — G9340 Encephalopathy, unspecified: Secondary | ICD-10-CM | POA: Diagnosis not present

## 2021-01-21 LAB — COMPREHENSIVE METABOLIC PANEL
ALT: 104 U/L — ABNORMAL HIGH (ref 0–44)
AST: 102 U/L — ABNORMAL HIGH (ref 15–41)
Albumin: 1.5 g/dL — ABNORMAL LOW (ref 3.5–5.0)
Alkaline Phosphatase: 89 U/L (ref 38–126)
Anion gap: 4 — ABNORMAL LOW (ref 5–15)
BUN: 78 mg/dL — ABNORMAL HIGH (ref 8–23)
CO2: 24 mmol/L (ref 22–32)
Calcium: 8.9 mg/dL (ref 8.9–10.3)
Chloride: 119 mmol/L — ABNORMAL HIGH (ref 98–111)
Creatinine, Ser: 1.39 mg/dL — ABNORMAL HIGH (ref 0.44–1.00)
GFR, Estimated: 38 mL/min — ABNORMAL LOW (ref >60)
Glucose, Bld: 146 mg/dL — ABNORMAL HIGH (ref 70–99)
Potassium: 3.8 mmol/L (ref 3.5–5.1)
Sodium: 147 mmol/L — ABNORMAL HIGH (ref 135–145)
Total Bilirubin: 0.6 mg/dL (ref 0.3–1.2)
Total Protein: 4.6 g/dL — ABNORMAL LOW (ref 6.5–8.1)

## 2021-01-21 LAB — CBC WITH DIFFERENTIAL/PLATELET
Abs Immature Granulocytes: 0.03 10*3/uL (ref 0.00–0.07)
Basophils Absolute: 0 10*3/uL (ref 0.0–0.1)
Basophils Relative: 0 %
Eosinophils Absolute: 0.1 10*3/uL (ref 0.0–0.5)
Eosinophils Relative: 2 %
HCT: 24.5 % — ABNORMAL LOW (ref 36.0–46.0)
Hemoglobin: 7.5 g/dL — ABNORMAL LOW (ref 12.0–15.0)
Immature Granulocytes: 1 %
Lymphocytes Relative: 15 %
Lymphs Abs: 0.7 10*3/uL (ref 0.7–4.0)
MCH: 31.1 pg (ref 26.0–34.0)
MCHC: 30.6 g/dL (ref 30.0–36.0)
MCV: 101.7 fL — ABNORMAL HIGH (ref 80.0–100.0)
Monocytes Absolute: 0.5 10*3/uL (ref 0.1–1.0)
Monocytes Relative: 10 %
Neutro Abs: 3.5 10*3/uL (ref 1.7–7.7)
Neutrophils Relative %: 72 %
Platelets: 99 10*3/uL — ABNORMAL LOW (ref 150–400)
RBC: 2.41 MIL/uL — ABNORMAL LOW (ref 3.87–5.11)
RDW: 16.8 % — ABNORMAL HIGH (ref 11.5–15.5)
WBC: 4.9 10*3/uL (ref 4.0–10.5)
nRBC: 0 % (ref 0.0–0.2)

## 2021-01-21 LAB — GLUCOSE, CAPILLARY
Glucose-Capillary: 130 mg/dL — ABNORMAL HIGH (ref 70–99)
Glucose-Capillary: 141 mg/dL — ABNORMAL HIGH (ref 70–99)
Glucose-Capillary: 151 mg/dL — ABNORMAL HIGH (ref 70–99)
Glucose-Capillary: 158 mg/dL — ABNORMAL HIGH (ref 70–99)
Glucose-Capillary: 172 mg/dL — ABNORMAL HIGH (ref 70–99)
Glucose-Capillary: 213 mg/dL — ABNORMAL HIGH (ref 70–99)

## 2021-01-21 LAB — URINE CULTURE: Culture: 80000 — AB

## 2021-01-21 LAB — MAGNESIUM: Magnesium: 2.2 mg/dL (ref 1.7–2.4)

## 2021-01-21 LAB — PHOSPHORUS: Phosphorus: 2.4 mg/dL — ABNORMAL LOW (ref 2.5–4.6)

## 2021-01-21 NOTE — Progress Notes (Signed)
PROGRESS NOTE    Carrie Monroe  RWE:[id number] DOB: Sep 26, 1935 DOA: 01/18/2021 PCP: Ginger Organ., MD    Brief Narrative:  85 year old female with history of congestive heart failure, coronary artery disease status post stenting, atrial fibrillation tachybradycardia syndrome, diabetes, chronic kidney disease and anemia recently admitted to the hospital with weakness and pseudogout of the knee was discharged to a skilled nursing rehab 9 days ago brought back to the emergency room with elevated creatinine on routine exam at the skilled nursing facility.  Patient is poor historian.  Patient's daughter tells me that she had been looking weak and tired however she has not noticed anything new.  In the emergency room CT scan abdomen pelvis showed prominent distention of the bladder with bilateral hydronephrosis and hydroureter, changes of hepatic cirrhosis with prominent splenic vein varices and splenomegaly.  Creatinine 2.3 and BUN 137.  A Foley catheter was placed.  Admitted for further work-up.   Assessment & Plan:   Principal Problem:   ARF (acute renal failure) (HCC) Active Problems:   Essential hypertension   CAD- severe 3V CAD    Three-vessel CAD-PCI to LAD and LCx, med management of RCA   S/P TAVR (transcatheter aortic valve replacement)   Aortic stenosis, severe   Persistent atrial fibrillation (HCC)   AKI (acute kidney injury) ([place])   Acute encephalopathy  Acute kidney injury on chronic kidney disease stage IV: Baseline creatinine about 1.5.  Probably postobstructive and dehydration.  Continue Foley catheter.  Gentle hydration.  Avoiding diuretics. Renal function improving.  BUN gradually improving to her baseline.  Continue half-normal saline today given hypernatremia. Discharged with Foley catheter until patient has good clinical improvement of her mobility.  Acute metabolic encephalopathy in a patient with multiple medical problems: Likely metabolic from uremia and  UTI.  Urea 135 on presentation.  Trending down with IV fluids.  Patient is making urine.  Ammonia is normal.  Continue to monitor. B 12 and folic acid is normal.  Urinalysis with more than 80,000 Pseudomonas, on cefepime. CT head was without any acute findings.   Mental status improving.  Elevated LFTs with CT scan concerning for hepatic cirrhosis: Hepatitis panel negative.  Dopplers of the portal vein pending.  Mild abnormal LFTs, can continue statin at this time.  If worsen, will consider stopping amiodarone. Compensated.  Ammonia normal.  Repeat examination with a stable LFTs.  Acute UTI present on admission: Due to Pseudomonas.  Pansensitive.  Currently on cefepime.  No safe oral choices.  Will avoid ciprofloxacin. Continue cefepime while in the hospital, can give a dose of fosfomycin when ready to discharge to complete 7 days of therapy.  Chronic A. fib on amiodarone and Eliquis: Continue amiodarone.  Therapeutic on Eliquis.  Repeat EKG consistent with chronic left bundle branch block pattern.  Chronic combined heart failure: Without exacerbation.  Euvolemic.  Treating with IV fluid today.  Start working with PT OT and referred to SNF for rehab.   DVT prophylaxis: apixaban (ELIQUIS) tablet 2.5 mg Start: 01/19/21 1200 apixaban (ELIQUIS) tablet 2.5 mg   Code Status: DNR Family Communication: Daughter [name] and son [name] at the bedside. Disposition Plan: Status is: Inpatient.   Dispo: The patient is from: SNF              Anticipated d/c is to: SNF              Patient currently is not medically stable.   Difficult to place patient No  Patient is  somehow medically stabilizing.  BUN trending down, will continue on IV fluids today.  Possibly can transfer to a skilled nursing facility tomorrow or Monday.   Consultants:  None  Procedures:  None  Antimicrobials:  Rocephin 8/24--- 8/25 Cefepime 8/26--   Subjective: Patient seen and examined.  No overnight events.  She was more  awake today and able to keep up conversation. Patient was seen with her daughter and son at the bedside in the afternoon, she was much more awake and keeping up conversation.  Feels extremely weak.  Objective: Vitals:   01/20/21 2229 01/21/21 0513 01/21/21 1009 01/21/21 1124  BP: (!) 134/50 (!) 141/52 (!) 152/54 (!) 138/51  Pulse: 65 63 63 65  Resp: (!) 22 20 19  (!) 24  Temp: 97.9 F (36.6 C) 98 F (36.7 C)  97.8 F (36.6 C)  TempSrc: Oral Oral  Oral  SpO2: 98% 96% 100% 100%  Weight:      Height:        Intake/Output Summary (Last 24 hours) at 01/21/2021 1441 Last data filed at 01/21/2021 1245 Gross per 24 hour  Intake 439.66 ml  Output 2050 ml  Net -1610.34 ml   Filed Weights   01/18/21 1747  Weight: 77.4 kg    Examination:  General: Frail debilitated and chronically sick looking. Looks comfortable on room air. Cardiovascular: S1-S2 normal.  Regular rate rhythm. Respiratory: Bilateral clear.  No added sounds. Gastrointestinal: Soft and nontender. Ext: No edema or cyanosis. Neuro:  Tired and lethargic.  Overall weak.  No focal deficits. Moves all extremities but extremely weak. Foley catheter with clear urine.     Data Reviewed: I have personally reviewed following labs and imaging studies  CBC: Recent Labs  Lab 01/18/21 1827 01/19/21 0217 01/20/21 0139 01/21/21 0314  WBC 10.4 6.8 5.8 4.9  NEUTROABS  --   --  4.5 3.5  HGB 9.5* 7.9* 7.3* 7.5*  HCT 30.5* 25.2* 24.1* 24.5*  MCV 100.3* 100.4* 101.7* 101.7*  PLT 171 138* 112* 99*   Basic Metabolic Panel: Recent Labs  Lab 01/18/21 1827 01/19/21 0217 01/20/21 0139 01/21/21 0314  NA 137 140 146* 147*  K 4.4 4.2 3.4* 3.8  CL 104 111 118* 119*  CO2 23 23 24 24   GLUCOSE 157* 126* 163* 146*  BUN 137* 125* 98* 78*  CREATININE 2.35* 2.18* 1.72* 1.39*  CALCIUM 9.6 9.2 8.9 8.9  MG  --   --  2.4 2.2  PHOS  --   --  3.3 2.4*   GFR: Estimated Creatinine Clearance: 30 mL/min (A) (by C-G formula based on SCr  of 1.39 mg/dL (H)). Liver Function Tests: Recent Labs  Lab 01/18/21 1827 01/19/21 0217 01/21/21 0314  AST 106* 110* 102*  ALT 104* 101* 104*  ALKPHOS 108 88 89  BILITOT 0.8 0.8 0.6  PROT 6.0* 5.3* 4.6*  ALBUMIN 2.1* 1.8* 1.5*   Recent Labs  Lab 01/18/21 1827  LIPASE 49   Recent Labs  Lab 01/19/21 0217  AMMONIA 13   Coagulation Profile: Recent Labs  Lab 01/19/21 0217  INR 1.8*   Cardiac Enzymes: No results for input(s): CKTOTAL, CKMB, CKMBINDEX, TROPONINI in the last 168 hours. BNP (last 3 results) No results for input(s): PROBNP in the last 8760 hours. HbA1C: No results for input(s): HGBA1C in the last 72 hours. CBG: Recent Labs  Lab 01/20/21 2114 01/21/21 0056 01/21/21 0507 01/21/21 0733 01/21/21 1122  GLUCAP 186* 151* 141* 130* 172*   Lipid Profile: No results for  input(s): CHOL, HDL, LDLCALC, TRIG, CHOLHDL, LDLDIRECT in the last 72 hours. Thyroid Function Tests: No results for input(s): TSH, T4TOTAL, FREET4, T3FREE, THYROIDAB in the last 72 hours. Anemia Panel: Recent Labs    01/19/21 0217  VITAMINB12 >7,500*  FOLATE 16.8  FERRITIN 262  TIBC 199*  IRON 22*  RETICCTPCT 5.8*   Sepsis Labs: No results for input(s): PROCALCITON, LATICACIDVEN in the last 168 hours.  Recent Results (from the past 240 hour(s))  Resp Panel by RT-PCR (Flu A&B, Covid) Nasopharyngeal Swab     Status: None   Collection Time: 01/18/21  6:27 PM   Specimen: Nasopharyngeal Swab; Nasopharyngeal(NP) swabs in vial transport medium  Result Value Ref Range Status   SARS Coronavirus 2 by RT PCR NEGATIVE NEGATIVE Final    Comment: (NOTE) SARS-CoV-2 target nucleic acids are NOT DETECTED.  The SARS-CoV-2 RNA is generally detectable in upper respiratory specimens during the acute phase of infection. The lowest concentration of SARS-CoV-2 viral copies this assay can detect is 138 copies/mL. A negative result does not preclude SARS-Cov-2 infection and should not be used as the sole  basis for treatment or other patient management decisions. A negative result may occur with  improper specimen collection/handling, submission of specimen other than nasopharyngeal swab, presence of viral mutation(s) within the areas targeted by this assay, and inadequate number of viral copies(<138 copies/mL). A negative result must be combined with clinical observations, patient history, and epidemiological information. The expected result is Negative.  Fact Sheet for Patients:  EntrepreneurPulse.com.au  Fact Sheet for Healthcare Providers:  IncredibleEmployment.be  This test is no t yet approved or cleared by the [place] FDA and  has been authorized for detection and/or diagnosis of SARS-CoV-2 by FDA under an Emergency Use Authorization (EUA). This EUA will remain  in effect (meaning this test can be used) for the duration of the COVID-19 declaration under Section 564(b)(1) of the Act, 21 U.S.C.section 360bbb-3(b)(1), unless the authorization is terminated  or revoked sooner.       Influenza A by PCR NEGATIVE NEGATIVE Final   Influenza B by PCR NEGATIVE NEGATIVE Final    Comment: (NOTE) The Xpert Xpress SARS-CoV-2/FLU/RSV plus assay is intended as an aid in the diagnosis of influenza from Nasopharyngeal swab specimens and should not be used as a sole basis for treatment. Nasal washings and aspirates are unacceptable for Xpert Xpress SARS-CoV-2/FLU/RSV testing.  Fact Sheet for Patients: EntrepreneurPulse.com.au  Fact Sheet for Healthcare Providers: IncredibleEmployment.be  This test is not yet approved or cleared by the [place] FDA and has been authorized for detection and/or diagnosis of SARS-CoV-2 by FDA under an Emergency Use Authorization (EUA). This EUA will remain in effect (meaning this test can be used) for the duration of the COVID-19 declaration under Section 564(b)(1) of the Act,  21 U.S.C. section 360bbb-3(b)(1), unless the authorization is terminated or revoked.  Performed at [facility], [place] [address] [place], [place] [address]   Urine Culture     Status: Abnormal   Collection Time: 01/18/21 11:10 PM   Specimen: Urine, Clean Catch  Result Value Ref Range Status   Specimen Description URINE, CLEAN CATCH  Final   Special Requests   Final    NONE Performed at [facility], [place] [address] [place], [place] [address]    Culture 80,000 COLONIES/mL PSEUDOMONAS AERUGINOSA (A)  Final   Report Status 01/21/2021 FINAL  Final   Organism ID, Bacteria PSEUDOMONAS AERUGINOSA (A)  Final  Susceptibility   Pseudomonas aeruginosa - MIC*    CEFTAZIDIME 4 SENSITIVE Sensitive     CIPROFLOXACIN <=0.25 SENSITIVE Sensitive     GENTAMICIN 2 SENSITIVE Sensitive     IMIPENEM 2 SENSITIVE Sensitive     PIP/TAZO 8 SENSITIVE Sensitive     CEFEPIME 2 SENSITIVE Sensitive     * 80,000 COLONIES/mL PSEUDOMONAS AERUGINOSA         Radiology Studies: No results found.      Scheduled Meds:  amiodarone  200 mg Oral Daily   apixaban  2.5 mg Oral BID   Chlorhexidine Gluconate Cloth  6 each Topical Daily   ferrous sulfate  325 mg Oral Daily   vitamin B-12  500 mcg Oral Daily   Continuous Infusions:  sodium chloride 75 mL/hr at 01/21/21 0512   ceFEPime (MAXIPIME) IV 2 g (01/21/21 1217)     LOS: 2 days    Time spent: 30 minutes    Barb Merino, MD [facility] Pager [phone]

## 2021-01-21 NOTE — Evaluation (Signed)
Physical Therapy Evaluation Patient Details Name: FRAYA UEDA MRN: [id number] DOB: Apr 03, 1936 Today's Date: 01/21/2021   History of Present Illness  85 y.o. female presents to [facility] from SNF on 01/18/2021 with weakness and pseudogout of knee along with elevated creatinine. CT abdomen/pelvis demonstrates bladder distention with bilateral hydronephrosis and hydroureter, changes of hepatic cirrhosis with prominent splenic vein varices and splenomegaly. Pt found to have UTI.  Clinical Impression  Pt presents to PT with deficits in functional mobility, strength, power, balance, cognition. Pt demonstrates flat affect at this time, seemingly with limited motivation to mobilize. Pt requires significant physical assistance to perform all functional mobility tasks at this time. Pt remains at a high falls risk, PT recommending use of hoyer lift for transfers with nursing staff. PT recommends discharge to SNF in an effort to improve mobility quality and reduce caregiver burden.    Follow Up Recommendations SNF    Equipment Recommendations  Wheelchair (measurements PT);Wheelchair cushion (measurements PT);3in1 (PT);Hospital bed (hoyer lift)    Recommendations for Other Services       Precautions / Restrictions Precautions Precautions: Fall;Other (comment) Precaution Comments: painful sore on buttocks Restrictions Weight Bearing Restrictions: No      Mobility  Bed Mobility Overal bed mobility: Needs Assistance Bed Mobility: Supine to Sit;Rolling Rolling: Total assist   Supine to sit: Max assist;+2 for physical assistance;HOB elevated          Transfers Overall transfer level: Needs assistance Equipment used: 1 person hand held assist Transfers: Sit to/from Stand;Lateral/Scoot Transfers Sit to Stand: Total assist        Lateral/Scoot Transfers: Total assist    Ambulation/Gait                Stairs            Wheelchair Mobility    Modified Rankin  (Stroke Patients Only)       Balance Overall balance assessment: Needs assistance Sitting-balance support: Bilateral upper extremity supported;Feet supported Sitting balance-Leahy Scale: Poor Sitting balance - Comments: minG-modA, often with R lateral lean Postural control: Right lateral lean Standing balance support: Bilateral upper extremity supported Standing balance-Leahy Scale: Zero Standing balance comment: max-totalA                             Pertinent Vitals/Pain Pain Assessment: Faces Faces Pain Scale: Hurts even more Pain Location: buttocks Pain Descriptors / Indicators: Grimacing Pain Intervention(s): Monitored during session    Home Living Family/patient expects to be discharged to:: Skilled nursing facility Living Arrangements: Alone Available Help at Discharge: Family;Available PRN/intermittently;Friend(s) Type of Home: House Home Access: Ramped entrance     Home Layout: One level Home Equipment: Bedside commode;Shower seat;Grab bars - tub/shower;Hand held shower head;Transport chair      Prior Function Level of Independence: Independent         Comments: pt was independent prior to previous admission. Daughter reports pt has not been out of bed in over a week.     Hand Dominance   Dominant Hand: Right    Extremity/Trunk Assessment   Upper Extremity Assessment Upper Extremity Assessment: Generalized weakness (variable performance but profound weakness throughout session)    Lower Extremity Assessment Lower Extremity Assessment: Generalized weakness (3/5 knee extension bilaterally, 3/5 ankle PF/DF)    Cervical / Trunk Assessment Cervical / Trunk Assessment: Kyphotic  Communication   Communication: Expressive difficulties (limited verbal communication)  Cognition Arousal/Alertness: Awake/alert Behavior During Therapy: Flat affect Overall Cognitive  Status: Impaired/Different from baseline Area of Impairment:  Attention;Memory;Following commands;Safety/judgement;Awareness;Problem solving                   Current Attention Level: Sustained Memory: Decreased short-term memory Following Commands: Follows one step commands with increased time Safety/Judgement: Decreased awareness of safety;Decreased awareness of deficits Awareness: Intellectual Problem Solving: Slow processing;Decreased initiation;Requires verbal cues;Requires tactile cues        General Comments General comments (skin integrity, edema, etc.): VSS on RA    Exercises General Exercises - Lower Extremity Long Arc Quad: AROM;Both;5 reps   Assessment/Plan    PT Assessment Patient needs continued PT services  PT Problem List Decreased strength;Decreased activity tolerance;Decreased balance;Decreased mobility;Decreased cognition;Decreased knowledge of use of DME;Decreased safety awareness;Decreased knowledge of precautions;Pain       PT Treatment Interventions DME instruction;Gait training;Functional mobility training;Therapeutic activities;Therapeutic exercise;Balance training;Neuromuscular re-education;Patient/family education;Wheelchair mobility training    PT Goals (Current goals can be found in the Care Plan section)  Acute Rehab PT Goals Patient Stated Goal: to improve mobility quality PT Goal Formulation: With family Time For Goal Achievement: 02/04/21 Potential to Achieve Goals: Fair    Frequency Min 2X/week   Barriers to discharge        Co-evaluation               AM-PAC PT "6 Clicks" Mobility  Outcome Measure Help needed turning from your back to your side while in a flat bed without using bedrails?: Total Help needed moving from lying on your back to sitting on the side of a flat bed without using bedrails?: Total Help needed moving to and from a bed to a chair (including a wheelchair)?: Total Help needed standing up from a chair using your arms (e.g., wheelchair or bedside chair)?:  Total Help needed to walk in hospital room?: Total Help needed climbing 3-5 steps with a railing? : Total 6 Click Score: 6    End of Session   Activity Tolerance: Patient limited by fatigue Patient left: in chair;with call bell/phone within reach;with family/visitor present Nurse Communication: Mobility status;Need for lift equipment PT Visit Diagnosis: Other abnormalities of gait and mobility (R26.89);Muscle weakness (generalized) (M62.81);Pain Pain - part of body:  (buttocks)    Time: 2458-0998 PT Time Calculation (min) (ACUTE ONLY): 34 min   Charges:   PT Evaluation $PT Eval Moderate Complexity: 1 Mod PT Treatments $Therapeutic Activity: 8-22 mins        Zenaida Niece, PT, DPT Acute Rehabilitation Pager: [phone]   Zenaida Niece 01/21/2021, 1:20 PM

## 2021-01-21 NOTE — Progress Notes (Signed)
OT Cancellation Note  Patient Details Name: Carrie Monroe MRN: [id number] DOB: 02-21-1936   Cancelled Treatment:    Reason Eval/Treat Not Completed: Pain limiting ability to participate;Fatigue/lethargy limiting ability to participate (pt in chair upon arrival with complaints of pain, whispering "no, tomorrow" when discussed therapy participation. OT eval to f/u tomorrow as appropriate.)  Carrie Monroe 01/21/2021, 4:35 PM

## 2021-01-21 NOTE — Plan of Care (Signed)
  Problem: Clinical Measurements: Goal: Ability to maintain clinical measurements within normal limits will improve Outcome: Progressing   Problem: Clinical Measurements: Goal: Diagnostic test results will improve Outcome: Progressing   Problem: Clinical Measurements: Goal: Respiratory complications will improve Outcome: Progressing   Problem: Clinical Measurements: Goal: Cardiovascular complication will be avoided Outcome: Progressing   Problem: Activity: Goal: Risk for activity intolerance will decrease Outcome: Progressing   Problem: Nutrition: Goal: Adequate nutrition will be maintained Outcome: Progressing   Problem: Elimination: Goal: Will not experience complications related to urinary retention Outcome: Progressing   Problem: Pain Managment: Goal: General experience of comfort will improve Outcome: Progressing   Problem: Safety: Goal: Ability to remain free from injury will improve Outcome: Progressing   Problem: Skin Integrity: Goal: Risk for impaired skin integrity will decrease Outcome: Progressing

## 2021-01-22 DIAGNOSIS — I5043 Acute on chronic combined systolic (congestive) and diastolic (congestive) heart failure: Secondary | ICD-10-CM | POA: Diagnosis not present

## 2021-01-22 DIAGNOSIS — N179 Acute kidney failure, unspecified: Secondary | ICD-10-CM | POA: Diagnosis not present

## 2021-01-22 LAB — CBC WITH DIFFERENTIAL/PLATELET
Abs Immature Granulocytes: 0.04 10*3/uL (ref 0.00–0.07)
Basophils Absolute: 0 10*3/uL (ref 0.0–0.1)
Basophils Relative: 0 %
Eosinophils Absolute: 0.1 10*3/uL (ref 0.0–0.5)
Eosinophils Relative: 2 %
HCT: 24.3 % — ABNORMAL LOW (ref 36.0–46.0)
Hemoglobin: 7.2 g/dL — ABNORMAL LOW (ref 12.0–15.0)
Immature Granulocytes: 1 %
Lymphocytes Relative: 12 %
Lymphs Abs: 0.7 10*3/uL (ref 0.7–4.0)
MCH: 30 pg (ref 26.0–34.0)
MCHC: 29.6 g/dL — ABNORMAL LOW (ref 30.0–36.0)
MCV: 101.3 fL — ABNORMAL HIGH (ref 80.0–100.0)
Monocytes Absolute: 0.3 10*3/uL (ref 0.1–1.0)
Monocytes Relative: 6 %
Neutro Abs: 4.5 10*3/uL (ref 1.7–7.7)
Neutrophils Relative %: 79 %
Platelets: 95 10*3/uL — ABNORMAL LOW (ref 150–400)
RBC: 2.4 MIL/uL — ABNORMAL LOW (ref 3.87–5.11)
RDW: 16.8 % — ABNORMAL HIGH (ref 11.5–15.5)
WBC: 5.7 10*3/uL (ref 4.0–10.5)
nRBC: 0 % (ref 0.0–0.2)

## 2021-01-22 LAB — COMPREHENSIVE METABOLIC PANEL
ALT: 102 U/L — ABNORMAL HIGH (ref 0–44)
AST: 91 U/L — ABNORMAL HIGH (ref 15–41)
Albumin: 1.5 g/dL — ABNORMAL LOW (ref 3.5–5.0)
Alkaline Phosphatase: 106 U/L (ref 38–126)
Anion gap: 4 — ABNORMAL LOW (ref 5–15)
BUN: 56 mg/dL — ABNORMAL HIGH (ref 8–23)
CO2: 22 mmol/L (ref 22–32)
Calcium: 8.9 mg/dL (ref 8.9–10.3)
Chloride: 118 mmol/L — ABNORMAL HIGH (ref 98–111)
Creatinine, Ser: 1.16 mg/dL — ABNORMAL HIGH (ref 0.44–1.00)
GFR, Estimated: 47 mL/min — ABNORMAL LOW (ref >60)
Glucose, Bld: 130 mg/dL — ABNORMAL HIGH (ref 70–99)
Potassium: 3.8 mmol/L (ref 3.5–5.1)
Sodium: 144 mmol/L (ref 135–145)
Total Bilirubin: 0.6 mg/dL (ref 0.3–1.2)
Total Protein: 4.6 g/dL — ABNORMAL LOW (ref 6.5–8.1)

## 2021-01-22 LAB — GLUCOSE, CAPILLARY
Glucose-Capillary: 110 mg/dL — ABNORMAL HIGH (ref 70–99)
Glucose-Capillary: 118 mg/dL — ABNORMAL HIGH (ref 70–99)
Glucose-Capillary: 123 mg/dL — ABNORMAL HIGH (ref 70–99)
Glucose-Capillary: 128 mg/dL — ABNORMAL HIGH (ref 70–99)
Glucose-Capillary: 129 mg/dL — ABNORMAL HIGH (ref 70–99)
Glucose-Capillary: 158 mg/dL — ABNORMAL HIGH (ref 70–99)

## 2021-01-22 LAB — PREPARE RBC (CROSSMATCH)

## 2021-01-22 MED ORDER — SODIUM CHLORIDE 0.9% IV SOLUTION: Freq: Once | INTRAVENOUS | Status: AC

## 2021-01-22 MED ORDER — SODIUM CHLORIDE 0.9% IV SOLUTION: Freq: Once | INTRAVENOUS | Status: DC

## 2021-01-22 MED ORDER — ACETAMINOPHEN 325 MG PO TABS
650.0000 mg | ORAL_TABLET | Freq: Once | ORAL | Status: AC
  Administered 2021-01-23: 650 mg via ORAL
  Filled 2021-01-22: qty 2

## 2021-01-22 MED ORDER — DIPHENHYDRAMINE HCL 25 MG PO CAPS: 25.0000 mg | ORAL_CAPSULE | Freq: Once | ORAL | Status: DC

## 2021-01-22 MED ORDER — FUROSEMIDE 10 MG/ML IJ SOLN
40.0000 mg | Freq: Once | INTRAMUSCULAR | Status: AC
  Administered 2021-01-23: 40 mg via INTRAVENOUS
  Filled 2021-01-22: qty 4

## 2021-01-22 MED ORDER — FUROSEMIDE 10 MG/ML IJ SOLN: 20.0000 mg | Freq: Once | INTRAMUSCULAR | Status: DC

## 2021-01-22 NOTE — Evaluation (Signed)
Occupational Therapy Evaluation Patient Details Name: Carrie Monroe MRN: [id number] DOB: 1935-10-23 Today's Date: 01/22/2021    History of Present Illness 85 y.o. female presents to [facility] from SNF on 01/18/2021 with weakness and pseudogout of knee along with elevated creatinine. CT abdomen/pelvis demonstrates bladder distention with bilateral hydronephrosis and hydroureter, changes of hepatic cirrhosis with prominent splenic vein varices and splenomegaly. Pt found to have UTI.   Clinical Impression   Patient admitted for above and limited by problem list below, including B UE weakness and edema, pain, decreased activity tolerance.  She is on RA and VSS during session, lethargic but follows simple 1 step commands. Soft spoken but appropriately answers and engages with therapist, poor attention, problem solving and awareness; disoriented to time.  Pt requires total assist for ADLs at this time, total assist for bed mobility (would require +2 for OOB activities).  She will benefit from further OT services while admitted and after dc at SNF level to optimize independence and decrease burden of care.     Follow Up Recommendations  SNF;Supervision/Assistance - 24 hour    Equipment Recommendations  Other (comment) (TBD)    Recommendations for Other Services       Precautions / Restrictions Precautions Precautions: Fall;Other (comment) Precaution Comments: painful sore on buttocks Restrictions Weight Bearing Restrictions: No      Mobility Bed Mobility Overal bed mobility: Needs Assistance Bed Mobility: Rolling Rolling: Total assist         General bed mobility comments: attempted rolling, total assist    Transfers                 General transfer comment: deferred    Balance                                           ADL either performed or assessed with clinical judgement   ADL Overall ADL's : Needs assistance/impaired     Grooming:  Wash/dry hands;Wash/dry face;Total assistance;Bed level Grooming Details (indicate cue type and reason): requires total assist with hand over hand                               General ADL Comments: total assist for all self care at this time     Vision   Additional Comments: cueing to maintain eyes open     Perception     Praxis      Pertinent Vitals/Pain Pain Assessment: Faces Faces Pain Scale: Hurts little more Pain Location: generalized Pain Descriptors / Indicators: Grimacing Pain Intervention(s): Monitored during session;Repositioned     Hand Dominance Right   Extremity/Trunk Assessment Upper Extremity Assessment Upper Extremity Assessment: RUE deficits/detail;LUE deficits/detail RUE Deficits / Details: grossly 3-/5, variable performance and noted edema RUE Coordination: decreased gross motor;decreased fine motor LUE Deficits / Details: limited shoudler ROM passively, 3-/5 elbow and distal (per chart old rotator cuff injury) LUE Coordination: decreased fine motor;decreased gross motor   Lower Extremity Assessment Lower Extremity Assessment: Defer to PT evaluation       Communication Communication Communication: Expressive difficulties (soft spoken, limited verbal communication)   Cognition Arousal/Alertness: Lethargic Behavior During Therapy: Flat affect Overall Cognitive Status: Impaired/Different from baseline Area of Impairment: Attention;Memory;Following commands;Safety/judgement;Awareness;Problem solving;Orientation                 Orientation Level: Disoriented  to;Time;Situation Current Attention Level: Focused Memory: Decreased short-term memory Following Commands: Follows one step commands with increased time Safety/Judgement: Decreased awareness of safety;Decreased awareness of deficits Awareness: Intellectual Problem Solving: Slow processing;Decreased initiation;Difficulty sequencing;Requires verbal cues;Requires tactile  cues General Comments: pt disoriented to time, knows she is at the hospital.  Follows simple commands but requires increased time.   General Comments  VSS on RA, RN notified of swelling to BUES    Exercises Exercises: Other exercises Other Exercises Other Exercises: PROM to BUE in supine (limited L shoulder) and elevated BUEs on pillows to support edema reduction   Shoulder Instructions      Home Living Family/patient expects to be discharged to:: Skilled nursing facility                                        Prior Functioning/Environment Level of Independence: Independent        Comments: pt was independent prior to previous admission. Daughter reports pt has not been out of bed in over a week.        OT Problem List: Decreased strength;Decreased range of motion;Decreased activity tolerance;Impaired balance (sitting and/or standing);Decreased coordination;Decreased cognition;Decreased safety awareness;Decreased knowledge of use of DME or AE;Decreased knowledge of precautions;Pain;Increased edema      OT Treatment/Interventions: Self-care/ADL training;Therapeutic exercise;Energy conservation;DME and/or AE instruction;Therapeutic activities;Patient/family education;Cognitive remediation/compensation    OT Goals(Current goals can be found in the care plan section) Acute Rehab OT Goals Patient Stated Goal: none stated OT Goal Formulation: Patient unable to participate in goal setting Time For Goal Achievement: 02/05/21 Potential to Achieve Goals: Fair  OT Frequency: Min 2X/week   Barriers to D/C:            Co-evaluation              AM-PAC OT "6 Clicks" Daily Activity     Outcome Measure Help from another person eating meals?: Total Help from another person taking care of personal grooming?: Total Help from another person toileting, which includes using toliet, bedpan, or urinal?: Total Help from another person bathing (including washing,  rinsing, drying)?: Total Help from another person to put on and taking off regular upper body clothing?: Total Help from another person to put on and taking off regular lower body clothing?: Total 6 Click Score: 6   End of Session Nurse Communication: Mobility status;Other (comment) (edema)  Activity Tolerance: Patient limited by lethargy Patient left: in bed;with call bell/phone within reach;with bed alarm set  OT Visit Diagnosis: Other abnormalities of gait and mobility (R26.89);Muscle weakness (generalized) (M62.81);Other symptoms and signs involving cognitive function;Pain Pain - part of body:  (generalized)                Time: 6720-9470 OT Time Calculation (min): 15 min Charges:  OT General Charges $OT Visit: 1 Visit OT Evaluation $OT Eval Moderate Complexity: 1 Mod  Jolaine Artist, OT Acute Rehabilitation Services Pager [phone] Office [phone]   Delight Stare 01/22/2021, 9:13 AM
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
## 2021-01-22 NOTE — Progress Notes (Signed)
PROGRESS NOTE   Carrie Monroe  IFO:[id number] DOB: Jan 01, 1936 DOA: 01/18/2021 PCP: Ginger Organ., MD  Brief Narrative:  85 year old community dwelling white female CAD PCI RCA stenting 2018. AoS TAVR, A. fib CHADS2 score >5 status post DCCV 03/15/2020/amiodarone, EF 620 2240-45% Eliquis 01/2017-complicated hemorrhagic shock hematoma, chronic left bundle, carotid stenosis status post TCAR 2020 DM TY 2, stage III CKD Breast cancer status postlumpectomy/XRT Locally invasive squamous cell CA scalp referred to XRT as declined surgical intervention Pseudogout Prior meds sacral stage II pressure ulcer  Recent hospitalization 01/02/2021--01/09/2021 with severe fatigue generalized weakness-found to have acute superimposed on chronic diastolic heart failure as well as decompensated pulmonary hypertension--at that admission palliative care consulted  Readmit 01/18/2021 new onset confusion, elevated creatinine CT abdomen pelvis = prominent bladder distention with bilateral hydro nephrosis hydroureter and bilateral reflux BUNs/creatinine 54/1.5-->137/2.3 with slightly elevated LFTs in addition  Hospital-Problem based course  Toxic metabolic encephalopathy secondary to uremia Now improving, careful with sedating agents MRI brain contemplated - hold for now-cognition improving ATN secondary to hydronephrosis hydroureter status post Foley placement in ED on admission Saline lock IV, continue Foley for now may consider voiding trial although has to be much more independent to ensure no sacral breakdown from urine Labs are improving significantly ?  Pseudomonas urinary tract infection on admission-80,000 CFU on Received cefepime X 5 days-would hold further antibiotics at this time Periodic rechecks HFpEF systolic and diastolic 67-67% with moderately elevated PASP DC-CV 04/2020 [facility]--?consideration for BIV [facility] at [facility] last visit Recent hospital admission for this-discharged on Lasix 40  daily Patient could have right-sided failure causing elevated LFTs If cirrhosis is confirmed on ultrasound, may need more aggressive diuresis/discussion with cardiology regarding amiodarone as may need to discontinue this--Cardiology to non-emergently see Elevated LFTs?  Cirrhosis Acute hepatitis panel is negative CT abdomen pelvis implies cirrhosis-wrong ultrasound was ordered therefore have reordered complete abdominal ultrasound Get INR in addition Etiology?  Amiodarone?  Fatty liver Chronic atrial fibrillation with tachybradycardia syndrome Continue anticoagulation with apixaban 2.5 twice daily, Continue amiodarone 200 daily Macrocytic anemia Patient has dropped her hemoglobin some although her baseline seems to be in the range of 8-9 We will give her 1 unit of blood today with Lasix after this Locally invasive squamous cell CA scalp Recent OV [facility] and patient has declined any chemo or radiation to the area Palliative care consulted last admission and may need to resume the same on discharge    DVT prophylaxis: Eliquis 2.5 twice daily Code Status: DNR Family Communication: Discussed on phone with relatives [name] [phone] and at bedside with friend Santiago Glad Disposition:  Status is: Inpatient  Remains inpatient appropriate because:Hemodynamically unstable, Unsafe d/c plan, and IV treatments appropriate due to intensity of illness or inability to take PO  Dispo: The patient is from:               Anticipated d/c is to: SNF              Patient currently is not medically stable to d/c.   Difficult to place patient No    Consultants:  Cardiology  Procedures:   Antimicrobials:     Subjective:  Listless and seems tired Nursing informed me patient seems well and family had concerns No chest pain Did not eat much of her dysphagia 1 diet only drank fluids No chest pain no fever She seems overall quite tired  Objective: Vitals:   01/21/21 1009 01/21/21 1124 01/21/21  2008 01/22/21 0400  BP: Marland Kitchen)  152/54 (!) 138/51 (!) 129/53 (!) 129/44  Pulse: 63 65 66 60  Resp: 19 (!) 24 19 (!) 22  Temp:  97.8 F (36.6 C) 98 F (36.7 C) 99 F (37.2 C)  TempSrc:  Oral Oral Oral  SpO2: 100% 100% 98% 96%  Weight:      Height:        Intake/Output Summary (Last 24 hours) at 01/22/2021 0826 Last data filed at 01/22/2021 0600 Gross per 24 hour  Intake 1863.69 ml  Output 2000 ml  Net -136.31 ml   Filed Weights   01/18/21 1747  Weight: 77.4 kg    Examination:  Listless cachectic white female no distress quite swollen upper extremities lower extremities S1-S2 no murmur ROM intact moving upper and lower extremities quite swollen Foley in place Chest clinically clear no rales no rhonchi- Although it is limited by effort I cannot appreciate JVD no hepatojugular reflex on exam Neurologically power is 5/5   Data Reviewed: personally reviewed   CBC    Component Value Date/Time   WBC 5.7 01/22/2021 0515   RBC 2.40 (L) 01/22/2021 0515   HGB 7.2 (L) 01/22/2021 0515   HGB 10.3 (L) 12/10/2019 1241   HGB 11.2 (L) 02/23/2015 1029   HCT 24.3 (L) 01/22/2021 0515   HCT 30.8 (L) 12/10/2019 1241   HCT 33.6 (L) 02/23/2015 1029   PLT 95 (L) 01/22/2021 0515   PLT 97 (LL) 12/10/2019 1241   MCV 101.3 (H) 01/22/2021 0515   MCV 97 12/10/2019 1241   MCV 95.1 02/23/2015 1029   MCH 30.0 01/22/2021 0515   MCHC 29.6 (L) 01/22/2021 0515   RDW 16.8 (H) 01/22/2021 0515   RDW 12.9 12/10/2019 1241   RDW 12.9 02/23/2015 1029   LYMPHSABS 0.7 01/22/2021 0515   LYMPHSABS 1.4 12/27/2016 0831   LYMPHSABS 1.3 02/23/2015 1029   MONOABS 0.3 01/22/2021 0515   MONOABS 0.3 02/23/2015 1029   EOSABS 0.1 01/22/2021 0515   EOSABS 0.2 12/27/2016 0831   BASOSABS 0.0 01/22/2021 0515   BASOSABS 0.0 12/27/2016 0831   BASOSABS 0.0 02/23/2015 1029   CMP Latest Ref Rng & Units 01/22/2021 01/21/2021 01/20/2021  Glucose 70 - 99 mg/dL 130(H) 146(H) 163(H)  BUN 8 - 23 mg/dL 56(H) 78(H) 98(H)   Creatinine 0.44 - 1.00 mg/dL 1.16(H) 1.39(H) 1.72(H)  Sodium 135 - 145 mmol/L 144 147(H) 146(H)  Potassium 3.5 - 5.1 mmol/L 3.8 3.8 3.4(L)  Chloride 98 - 111 mmol/L 118(H) 119(H) 118(H)  CO2 22 - 32 mmol/L 22 24 24   Calcium 8.9 - 10.3 mg/dL 8.9 8.9 8.9  Total Protein 6.5 - 8.1 g/dL 4.6(L) 4.6(L) -  Total Bilirubin 0.3 - 1.2 mg/dL 0.6 0.6 -  Alkaline Phos 38 - 126 U/L 106 89 -  AST 15 - 41 U/L 91(H) 102(H) -  ALT 0 - 44 U/L 102(H) 104(H) -     Radiology Studies: No results found.   Scheduled Meds:  sodium chloride   Intravenous Once   acetaminophen  650 mg Oral Once   amiodarone  200 mg Oral Daily   apixaban  2.5 mg Oral BID   Chlorhexidine Gluconate Cloth  6 each Topical Daily   diphenhydrAMINE  25 mg Oral Once   ferrous sulfate  325 mg Oral Daily   furosemide  20 mg Intravenous Once   furosemide  40 mg Intravenous Once   vitamin B-12  500 mcg Oral Daily   Continuous Infusions:     LOS: 3 days   Time  spent: 81 including consultant discussion time  Nita Sells, MD [facility] To contact the attending provider between 7A-7P or the covering provider during after hours 7P-7A, please log into the web site [email or web address] and access using universal [place] password for that web site. If you do not have the password, please call the hospital operator.  01/22/2021, 8:27 AM

## 2021-01-23 ENCOUNTER — Inpatient Hospital Stay (HOSPITAL_COMMUNITY): Payer: Medicare Other

## 2021-01-23 DIAGNOSIS — I2510 Atherosclerotic heart disease of native coronary artery without angina pectoris: Secondary | ICD-10-CM

## 2021-01-23 DIAGNOSIS — I5043 Acute on chronic combined systolic (congestive) and diastolic (congestive) heart failure: Secondary | ICD-10-CM

## 2021-01-23 DIAGNOSIS — I5023 Acute on chronic systolic (congestive) heart failure: Secondary | ICD-10-CM

## 2021-01-23 DIAGNOSIS — I4819 Other persistent atrial fibrillation: Secondary | ICD-10-CM | POA: Diagnosis not present

## 2021-01-23 LAB — COMPREHENSIVE METABOLIC PANEL
ALT: 95 U/L — ABNORMAL HIGH (ref 0–44)
AST: 80 U/L — ABNORMAL HIGH (ref 15–41)
Albumin: 1.5 g/dL — ABNORMAL LOW (ref 3.5–5.0)
Alkaline Phosphatase: 118 U/L (ref 38–126)
Anion gap: 4 — ABNORMAL LOW (ref 5–15)
BUN: 52 mg/dL — ABNORMAL HIGH (ref 8–23)
CO2: 21 mmol/L — ABNORMAL LOW (ref 22–32)
Calcium: 8.9 mg/dL (ref 8.9–10.3)
Chloride: 120 mmol/L — ABNORMAL HIGH (ref 98–111)
Creatinine, Ser: 1.24 mg/dL — ABNORMAL HIGH (ref 0.44–1.00)
GFR, Estimated: 43 mL/min — ABNORMAL LOW (ref >60)
Glucose, Bld: 120 mg/dL — ABNORMAL HIGH (ref 70–99)
Potassium: 3.6 mmol/L (ref 3.5–5.1)
Sodium: 145 mmol/L (ref 135–145)
Total Bilirubin: 1.1 mg/dL (ref 0.3–1.2)
Total Protein: 4.7 g/dL — ABNORMAL LOW (ref 6.5–8.1)

## 2021-01-23 LAB — CBC WITH DIFFERENTIAL/PLATELET
Abs Immature Granulocytes: 0.04 10*3/uL (ref 0.00–0.07)
Basophils Absolute: 0 10*3/uL (ref 0.0–0.1)
Basophils Relative: 0 %
Eosinophils Absolute: 0.1 10*3/uL (ref 0.0–0.5)
Eosinophils Relative: 2 %
HCT: 28.4 % — ABNORMAL LOW (ref 36.0–46.0)
Hemoglobin: 8.9 g/dL — ABNORMAL LOW (ref 12.0–15.0)
Immature Granulocytes: 1 %
Lymphocytes Relative: 12 %
Lymphs Abs: 0.7 10*3/uL (ref 0.7–4.0)
MCH: 31 pg (ref 26.0–34.0)
MCHC: 31.3 g/dL (ref 30.0–36.0)
MCV: 99 fL (ref 80.0–100.0)
Monocytes Absolute: 0.4 10*3/uL (ref 0.1–1.0)
Monocytes Relative: 6 %
Neutro Abs: 4.8 10*3/uL (ref 1.7–7.7)
Neutrophils Relative %: 79 %
Platelets: 94 10*3/uL — ABNORMAL LOW (ref 150–400)
RBC: 2.87 MIL/uL — ABNORMAL LOW (ref 3.87–5.11)
RDW: 16.9 % — ABNORMAL HIGH (ref 11.5–15.5)
WBC: 6.1 10*3/uL (ref 4.0–10.5)
nRBC: 0 % (ref 0.0–0.2)

## 2021-01-23 LAB — HEMOGLOBIN AND HEMATOCRIT, BLOOD
HCT: 25.9 % — ABNORMAL LOW (ref 36.0–46.0)
Hemoglobin: 8.1 g/dL — ABNORMAL LOW (ref 12.0–15.0)

## 2021-01-23 LAB — GLUCOSE, CAPILLARY
Glucose-Capillary: 116 mg/dL — ABNORMAL HIGH (ref 70–99)
Glucose-Capillary: 119 mg/dL — ABNORMAL HIGH (ref 70–99)
Glucose-Capillary: 111 mg/dL — ABNORMAL HIGH (ref 70–99)
Glucose-Capillary: 133 mg/dL — ABNORMAL HIGH (ref 70–99)
Glucose-Capillary: 145 mg/dL — ABNORMAL HIGH (ref 70–99)

## 2021-01-23 LAB — ECHOCARDIOGRAM COMPLETE
AR max vel: 0.96 cm2
AV Area VTI: 0.98 cm2
AV Area mean vel: 0.93 cm2
AV Mean grad: 17 mmHg
AV Peak grad: 32.9 mmHg
Ao pk vel: 2.87 m/s
Height: 62 in
MV M vel: 5.15 m/s
MV Peak grad: 105.9 mmHg
Radius: 0.4 cm
S' Lateral: 3.4 cm
Single Plane A4C EF: 66.7 %
Weight: 2723.12 oz

## 2021-01-23 LAB — PROTIME-INR
INR: 1.4 — ABNORMAL HIGH (ref 0.8–1.2)
Prothrombin Time: 16.7 seconds — ABNORMAL HIGH (ref 11.4–15.2)

## 2021-01-23 MED ORDER — DIPHENHYDRAMINE HCL 50 MG/ML IJ SOLN
12.5000 mg | Freq: Once | INTRAMUSCULAR | Status: AC
  Administered 2021-01-23: 12.5 mg via INTRAVENOUS
  Filled 2021-01-23: qty 1

## 2021-01-23 MED ORDER — ISOSORBIDE MONONITRATE ER 30 MG PO TB24
15.0000 mg | ORAL_TABLET | Freq: Every day | ORAL | Status: DC
  Administered 2021-01-23 – 2021-01-31 (×9): 15 mg via ORAL
  Filled 2021-01-23 (×9): qty 1

## 2021-01-23 MED ORDER — TAMSULOSIN HCL 0.4 MG PO CAPS
0.4000 mg | ORAL_CAPSULE | Freq: Every day | ORAL | Status: DC
  Administered 2021-01-23 – 2021-02-06 (×13): 0.4 mg via ORAL
  Filled 2021-01-23 (×14): qty 1

## 2021-01-23 MED ORDER — AMIODARONE HCL 200 MG PO TABS
200.0000 mg | ORAL_TABLET | Freq: Every day | ORAL | Status: DC
  Administered 2021-01-24 – 2021-01-31 (×8): 200 mg via ORAL
  Filled 2021-01-23 (×8): qty 1

## 2021-01-23 MED ORDER — HYDRALAZINE HCL 10 MG PO TABS
10.0000 mg | ORAL_TABLET | Freq: Three times a day (TID) | ORAL | Status: DC
  Administered 2021-01-23 – 2021-01-30 (×21): 10 mg via ORAL
  Filled 2021-01-23 (×24): qty 1

## 2021-01-23 MED ORDER — EZETIMIBE 10 MG PO TABS
10.0000 mg | ORAL_TABLET | Freq: Every day | ORAL | Status: DC
  Administered 2021-01-23 – 2021-01-31 (×9): 10 mg via ORAL
  Filled 2021-01-23 (×9): qty 1

## 2021-01-23 NOTE — NC FL2 (Signed)
[place] LEVEL OF CARE SCREENING TOOL     IDENTIFICATION  Patient Name: Carrie Monroe Birthdate: [birth year] Sex: female Admission Date (Current Location): 01/18/2021  [facility] and [place] Number:  Herbalist and Address:  The . [facility], [place] [address] [place], [place] [address]      Provider Number: [id number]  Attending Physician Name and Address:  Darliss Cheney, MD  Relative Name and Phone Number:  [name] [phone]    Current Level of Care: Hospital Recommended Level of Care: [place] Prior Approval Number:    Date Approved/Denied:   PASRR Number: [id number]  Discharge Plan: SNF    Current Diagnoses: Patient Active Problem List   Diagnosis Date Noted   ARF (acute renal failure) ([place]) 01/18/2021   Acute encephalopathy 01/18/2021   Paroxysmal atrial fibrillation ([place])    Tachycardia-bradycardia syndrome (HCC)    LBBB (left bundle branch block)    Volume overload 01/02/2021   Dizziness 07/14/2020   Biventricular failure ([place]) 07/14/2020   Pressure injury of skin 03/03/2020   Acute on chronic combined systolic and diastolic CHF (congestive heart failure) ([place]) 03/02/2020   AKI (acute kidney injury) ([place]) 03/02/2020   Thrombocytopenia ([place]) 03/02/2020   Secondary hypercoagulable state ([place]) 01/13/2020   Persistent atrial fibrillation (HCC)    Hypoxia    Acute on chronic diastolic CHF (congestive heart failure), NYHA class 3 ([place]) 11/21/2019   Atrial fibrillation with rapid ventricular response ([place]) 11/21/2019   Fever    Carotid stenosis 03/13/2019   Encounter for medication monitoring 11/27/2017   PICC (peripherally inserted central catheter) in place 11/27/2017   MSSA bacteremia 11/05/2017   Wound infection after surgery 11/04/2017   Anemia 09/27/2017   Hyperlipidemia    History of kidney stones    Hemorrhoids    Exogenous obesity    Chronic diastolic CHF (congestive heart failure)  ([place])    Arthritis    Aortic stenosis, severe    Chronic respiratory failure with hypoxia (HCC)    Hemorrhagic shock (HCC)    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   Carotid artery stenosis    Three-vessel CAD-PCI to LAD and LCx, med management of RCA    Chronic renal insufficiency, stage III (moderate) ([place]) 12/22/2016   Dental abscess- s/p multiple tooth extraction 12/21/16 12/22/2016   CAD- severe 3V CAD     Essential hypertension 01/27/2016   Morbid obesity due to excess calories ([place]) 01/27/2016   Gout 06/22/2013   Dyslipidemia 05/08/2011   Severe aortic stenosis 05/08/2011   Breast cancer of upper-outer quadrant of left female breast ([place]) 03/16/2011    Orientation RESPIRATION BLADDER Height & Weight     Self, Place  Normal Incontinent (Urethral Catheter Double lumen 14 Fr.) Weight: 170 lb 3.1 oz (77.2 kg) Height:  5\' 2"  (157.5 cm)  BEHAVIORAL SYMPTOMS/MOOD NEUROLOGICAL BOWEL NUTRITION STATUS      Incontinent Diet (Please see discharge summary)  AMBULATORY STATUS COMMUNICATION OF NEEDS Skin   Total Care Verbally Other (Comment) (Ecchymosis,arm,bilateral,abrasion,face,arm,excoriated,buttocks,mid foam,PI thigh posterior,R,stage 2,Foam lift dressing,daily,PI coccyx medial unstageable,foam lift dressing,daily)                       Personal Care Assistance Level of Assistance    Bathing Assistance: Maximum assistance Feeding assistance: Maximum assistance Dressing Assistance: Maximum assistance     Functional Limitations Info  Sight, Hearing, Speech Sight Info: Adequate Hearing Info: Adequate Speech Info: Adequate    SPECIAL CARE  FACTORS FREQUENCY  PT (By licensed PT), OT (By licensed OT)     PT Frequency: 5x min weekly OT Frequency: 5x min weekly            Contractures Contractures Info: Not present    Additional Factors Info  Code Status, Allergies Code Status Info: DNR Allergies Info: Oxycodone           Current  Medications (01/23/2021):  This is the current hospital active medication list Current Facility-Administered Medications  Medication Dose Route Frequency Provider Last Rate Last Admin   0.9 %  sodium chloride infusion (Manually program via Guardrails IV Fluids)   Intravenous Once Nita Sells, MD       amiodarone (PACERONE) tablet 200 mg  200 mg Oral Daily O'Neal, Cassie Freer, MD       apixaban Arne Cleveland) tablet 2.5 mg  2.5 mg Oral BID Barb Merino, MD   2.5 mg at 01/23/21 0370   Chlorhexidine Gluconate Cloth 2 % PADS 6 each  6 each Topical Daily Barb Merino, MD   6 each at 01/22/21 1329   ezetimibe (ZETIA) tablet 10 mg  10 mg Oral Daily Geralynn Rile, MD   10 mg at 01/23/21 1239   ferrous sulfate tablet 325 mg  325 mg Oral Daily Rise Patience, MD   325 mg at 01/23/21 4888   hydrALAZINE (APRESOLINE) tablet 10 mg  10 mg Oral Q8H Geralynn Rile, MD   10 mg at 01/23/21 1239   isosorbide mononitrate (IMDUR) 24 hr tablet 15 mg  15 mg Oral Daily Geralynn Rile, MD   15 mg at 01/23/21 1241   tamsulosin (FLOMAX) capsule 0.4 mg  0.4 mg Oral QPC breakfast Darliss Cheney, MD       vitamin B-12 (CYANOCOBALAMIN) tablet 500 mcg  500 mcg Oral Daily Rise Patience, MD   500 mcg at 01/23/21 9169     Discharge Medications: Please see discharge summary for a list of discharge medications.  Relevant Imaging Results:  Relevant Lab Results:   Additional Information [phone]  Trula Ore, LCSWA

## 2021-01-23 NOTE — TOC Initial Note (Signed)
Transition of Care [facility]) - Initial/Assessment Note    Patient Details  Name: Carrie Monroe MRN: [id number] Date of Birth: Jun 16, 1935  Transition of Care [facility]) CM/SW Contact:    Trula Ore, [place] Phone Number: 01/23/2021, 11:46 AM  Clinical Narrative:                  CSW received consult for possible SNF placement at time of discharge. CSW spoke with patients relative [place] regarding PT recommendation of SNF placement at time of discharge. Patients relative [name] expressed understanding of PT recommendation and is agreeable to SNF placement at time of discharge.  Patients relative [name] reports patient comes from [facility] short term. Patients relative gives permission for CSW to fax out initial referral to [facility]. Patient has not received the COVID vaccines. No further questions reported at this time. CSW to continue to follow and assist with discharge planning needs.   Expected Discharge Plan: Skilled Nursing Facility Barriers to Discharge: Continued Medical Work up   Patient Goals and CMS Choice   CMS Medicare.gov Compare Post Acute Care list provided to:: Patient Represenative (must comment) (patients relative [facility]) Choice offered to / list presented to :  (Patients relative [name])  Expected Discharge Plan and Services Expected Discharge Plan: [place] In-house Referral: Clinical Social Work     Living arrangements for the past 2 months: [place]                                      Prior Living Arrangements/Services Living arrangements for the past 2 months: [place] Lives with:: Self, Facility Resident (Patient is from [facility] short term) Patient language and need for interpreter reviewed:: Yes Do you feel safe going back to the place where you live?: No   SNF  Need for Family Participation in Patient Care: Yes (Comment) Care giver support system in place?: Yes  (comment)   Criminal Activity/Legal Involvement Pertinent to Current Situation/Hospitalization: No - Comment as needed  Activities of Daily Living Home Assistive Devices/Equipment: Other (Comment) (From SNF) ADL Screening (condition at time of admission) Patient's cognitive ability adequate to safely complete daily activities?: Yes Is the patient deaf or have difficulty hearing?: No Does the patient have difficulty seeing, even when wearing glasses/contacts?: No Does the patient have difficulty concentrating, remembering, or making decisions?: Yes Patient able to express need for assistance with ADLs?: Yes Does the patient have difficulty dressing or bathing?: Yes Independently performs ADLs?: No Communication: Independent Dressing (OT): Dependent Is this a change from baseline?: Pre-admission baseline Grooming: Dependent Is this a change from baseline?: Pre-admission baseline Bathing: Dependent Is this a change from baseline?: Pre-admission baseline Toileting: Dependent Is this a change from baseline?: Pre-admission baseline In/Out Bed: Dependent Is this a change from baseline?: Pre-admission baseline Walks in Home: Dependent Is this a change from baseline?: Pre-admission baseline Does the patient have difficulty walking or climbing stairs?: Yes Weakness of Legs: Both Weakness of Arms/Hands: Both  Permission Sought/Granted Permission sought to share information with : Case Manager, Family Supports, Chartered certified accountant granted to share information with : No  Share Information with NAME: Patient only oriented to self and place CSW spoke with relative [place] granted to share info w AGENCY: Patient only oriented to self and place CSW spoke with relative Stephanie/ SNF  Permission granted to share info w Relationship: Patient only  oriented to self and place CSW spoke with relative Stephanie/ relative  Permission granted to share info w Contact  Information: Patient only oriented to self and place CSW spoke with relative Stephanie/ relative [phone]  Emotional Assessment       Orientation: : Oriented to Self, Oriented to Place Alcohol / Substance Use: Not Applicable Psych Involvement: No (comment)  Admission diagnosis:  Cirrhosis ([place]) [K74.60] Uremia [N19] ARF (acute renal failure) ([place]) [N17.9] Obstructive uropathy [N13.9] AKI (acute kidney injury) ([place]) [N17.9] Skin ulcer of sacrum, limited to breakdown of skin ([place]) [G81.856] Patient Active Problem List   Diagnosis Date Noted   ARF (acute renal failure) ([place]) 01/18/2021   Acute encephalopathy 01/18/2021   Paroxysmal atrial fibrillation ([place])    Tachycardia-bradycardia syndrome ([place])    LBBB (left bundle branch block)    Volume overload 01/02/2021   Dizziness 07/14/2020   Biventricular failure ([place]) 07/14/2020   Pressure injury of skin 03/03/2020   Acute on chronic combined systolic and diastolic CHF (congestive heart failure) ([place]) 03/02/2020   AKI (acute kidney injury) ([place]) 03/02/2020   Thrombocytopenia ([place]) 03/02/2020   Secondary hypercoagulable state ([place]) 01/13/2020   Persistent atrial fibrillation (HCC)    Hypoxia    Acute on chronic diastolic CHF (congestive heart failure), NYHA class 3 ([place]) 11/21/2019   Atrial fibrillation with rapid ventricular response ([place]) 11/21/2019   Fever    Carotid stenosis 03/13/2019   Encounter for medication monitoring 11/27/2017   PICC (peripherally inserted central catheter) in place 11/27/2017   MSSA bacteremia 11/05/2017   Wound infection after surgery 11/04/2017   Anemia 09/27/2017   Hyperlipidemia    History of kidney stones    Hemorrhoids    Exogenous obesity    Chronic diastolic CHF (congestive heart failure) (HCC)    Arthritis    Aortic stenosis, severe    Chronic respiratory failure with hypoxia (HCC)    Hemorrhagic shock (HCC)    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   Carotid  artery stenosis    Three-vessel CAD-PCI to LAD and LCx, med management of RCA    Chronic renal insufficiency, stage III (moderate) ([place]) 12/22/2016   Dental abscess- s/p multiple tooth extraction 12/21/16 12/22/2016   CAD- severe 3V CAD     Essential hypertension 01/27/2016   Morbid obesity due to excess calories ([place]) 01/27/2016   Gout 06/22/2013   Dyslipidemia 05/08/2011   Severe aortic stenosis 05/08/2011   Breast cancer of upper-outer quadrant of left female breast ([place]) 03/16/2011   PCP:  Ginger Organ., MD Pharmacy:   [facility],  - [address] [place] [address] Phone: [phone] Fax: [phone]  [facility] [place], [place] [address] Phone: [phone] Fax: [phone]  [facility] [address] [place] [address] Phone: [phone] Fax: [phone]  [id number] [place], [place] [address] Phone: [phone] Fax: [phone]  [facility] [place], [place] - [address] [place] [address] Phone: [phone] Fax: [phone]     Social Determinants of Health (SDOH) Interventions    Readmission Risk Interventions Readmission Risk Prevention Plan 03/17/2020  Transportation Screening Complete  PCP or Specialist Appt within 3-5 Days Complete  HRI or [place] Complete  Social Work  Consult for Recovery Care Planning/Counseling Complete  Palliative Care Screening Not Applicable  Medication Review (RN Care Manager) Complete  Some recent data might be hidden

## 2021-01-23 NOTE — Progress Notes (Signed)
Cardiology Progress Note  Patient ID: Carrie Monroe MRN: [id number] DOB: 08-Jun-1935 Date of Encounter: 01/23/2021  Primary Cardiologist: Sanda Klein, MD  Subjective   Chief Complaint: None.   HPI: Drowsy this morning.  No complaints.  Appears euvolemic.  ROS:  All other ROS reviewed and negative. Pertinent positives noted in the HPI.     Inpatient Medications  Scheduled Meds:  sodium chloride   Intravenous Once   apixaban  2.5 mg Oral BID   Chlorhexidine Gluconate Cloth  6 each Topical Daily   ferrous sulfate  325 mg Oral Daily   vitamin B-12  500 mcg Oral Daily   Continuous Infusions:  PRN Meds:    Vital Signs   Vitals:   01/23/21 0100 01/23/21 0102 01/23/21 0428 01/23/21 0806  BP: (!) 133/46 (!) 133/46 (!) 135/49 (!) 133/52  Pulse: 63 (!) 59 61 63  Resp: (!) 21 (!) 22 20   Temp: 99.5 F (37.5 C) 99.5 F (37.5 C) 99.3 F (37.4 C) 98.9 F (37.2 C)  TempSrc: Oral Oral Oral Axillary  SpO2: 98% 97% 98% 95%  Weight:      Height:        Intake/Output Summary (Last 24 hours) at 01/23/2021 1204 Last data filed at 01/23/2021 1146 Gross per 24 hour  Intake 458 ml  Output 3700 ml  Net -3242 ml   Last 3 Weights 01/18/2021 01/09/2021 01/08/2021  Weight (lbs) 170 lb 10.2 oz 170 lb 10.2 oz 174 lb 6.1 oz  Weight (kg) 77.4 kg 77.4 kg 79.1 kg      Telemetry  Overnight telemetry shows sinus rhythm, left bundle branch block, which I personally reviewed.   ECG  The most recent ECG shows sinus rhythm heart rate 76, left bundle branch block, which I personally reviewed.   Physical Exam   Vitals:   01/23/21 0100 01/23/21 0102 01/23/21 0428 01/23/21 0806  BP: (!) 133/46 (!) 133/46 (!) 135/49 (!) 133/52  Pulse: 63 (!) 59 61 63  Resp: (!) 21 (!) 22 20   Temp: 99.5 F (37.5 C) 99.5 F (37.5 C) 99.3 F (37.4 C) 98.9 F (37.2 C)  TempSrc: Oral Oral Oral Axillary  SpO2: 98% 97% 98% 95%  Weight:      Height:        Intake/Output Summary (Last 24 hours) at  01/23/2021 1204 Last data filed at 01/23/2021 1146 Gross per 24 hour  Intake 458 ml  Output 3700 ml  Net -3242 ml    Last 3 Weights 01/18/2021 01/09/2021 01/08/2021  Weight (lbs) 170 lb 10.2 oz 170 lb 10.2 oz 174 lb 6.1 oz  Weight (kg) 77.4 kg 77.4 kg 79.1 kg    Body mass index is 31.21 kg/m.  General: Well nourished, well developed, in no acute distress Head: Atraumatic, normal size  Eyes: PEERLA, EOMI  Neck: Supple, no JVD Endocrine: No thryomegaly Cardiac: Normal S1, S2; RRR; no murmurs, rubs, or gallops Lungs: Clear to auscultation bilaterally, no wheezing, rhonchi or rales  Abd: Soft, nontender, no hepatomegaly  Ext: No edema, pulses 2+ Musculoskeletal: No deformities, BUE and BLE strength normal and equal Skin: Warm and dry, no rashes   Neuro: Alert and oriented to person, place, time, and situation, CNII-XII grossly intact, no focal deficits  Psych: Normal mood and affect   Labs  High Sensitivity Troponin:  No results for input(s): TROPONINIHS in the last 720 hours.   Cardiac EnzymesNo results for input(s): TROPONINI in the last 168 hours. No results for  input(s): TROPIPOC in the last 168 hours.  Chemistry Recent Labs  Lab 01/21/21 0314 01/22/21 0515 01/23/21 0638  NA 147* 144 145  K 3.8 3.8 3.6  CL 119* 118* 120*  CO2 24 22 21*  GLUCOSE 146* 130* 120*  BUN 78* 56* 52*  CREATININE 1.39* 1.16* 1.24*  CALCIUM 8.9 8.9 8.9  PROT 4.6* 4.6* 4.7*  ALBUMIN 1.5* 1.5* 1.5*  AST 102* 91* 80*  ALT 104* 102* 95*  ALKPHOS 89 106 118  BILITOT 0.6 0.6 1.1  GFRNONAA 38* 47* 43*  ANIONGAP 4* 4* 4*    Hematology Recent Labs  Lab 01/21/21 0314 01/22/21 0515 01/23/21 0638  WBC 4.9 5.7 6.1  RBC 2.41* 2.40* 2.87*  HGB 7.5* 7.2* 8.9*  HCT 24.5* 24.3* 28.4*  MCV 101.7* 101.3* 99.0  MCH 31.1 30.0 31.0  MCHC 30.6 29.6* 31.3  RDW 16.8* 16.8* 16.9*  PLT 99* 95* 94*   BNPNo results for input(s): BNP, PROBNP in the last 168 hours.  DDimer No results for input(s): DDIMER in the  last 168 hours.   Radiology  US Abdomen Complete  Result Date: 01/23/2021 CLINICAL DATA:  History of cirrhosis EXAM: ABDOMEN ULTRASOUND COMPLETE COMPARISON:  01/18/2021 FINDINGS: Gallbladder: Surgically removed Common bile duct: Diameter: 5.5 mm Liver: Minimal nodularity of the liver is noted without focal mass. These changes are consistent with the given clinical history of cirrhosis. Portal vein is patent on color Doppler imaging with normal direction of blood flow towards the liver. IVC: No abnormality visualized. Pancreas: Visualized portion unremarkable. Spleen: Prominent at 14.8 cm. Right Kidney: Length: 11.9 cm. Echogenicity within normal limits. No mass or hydronephrosis visualized. 2.9 cm cyst is noted in the mid to upper pole. Previously seen hydronephrotic changes have resolved likely related to bladder decompression. Left Kidney: Length: 10.6 cm. Mild cortical thinning is noted. No mass lesion is noted. Hydronephrosis has resolved related to bladder decompression. Abdominal aorta: No aneurysm visualized. Other findings: None. IMPRESSION: Mild changes of cirrhosis. Right renal cysts stable from prior CT. Resolution of previously seen bilateral hydronephrosis following bladder decompression. Electronically Signed   By: Inez Catalina M.D.   On: 01/23/2021 08:20    Cardiac Studies  TTE 11/22/2019  1. Left ventricular ejection fraction, by estimation, is 40 to 45%. The  left ventricle has mildly decreased function. The left ventricle  demonstrates global hypokinesis. There is mild concentric left ventricular  hypertrophy. Left ventricular diastolic  function could not be evaluated.   2. Right ventricular systolic function is mildly reduced. The right  ventricular size is mildly enlarged. There is moderately elevated  pulmonary artery systolic pressure. The estimated right ventricular  systolic pressure is 02.5 mmHg.   3. Left atrial size was severely dilated.   4. Right atrial size was  severely dilated.   5. The mitral valve is normal in structure. Mild to moderate mitral valve  regurgitation.   6. The aortic valve has been repaired/replaced. Aortic valve  regurgitation is not visualized. There is a 26 mm Medtronic  CoreValve-EvolutR prosthetic (TAVR) valve present in the aortic position.  Procedure Date: September 2018. Echo findings are  consistent with normal structure and function of the aortic valve  prosthesis.   7. The inferior vena cava is dilated in size with <50% respiratory  variability, suggesting right atrial pressure of 15 mmHg.   Patient Profile  BLONDELL LAPERLE is a 85 y.o. female with CAD status post drug-eluting stent to the LAD and RCA in 2018, aortic stenosis  status post TAVR, bilateral carotid artery disease status post CEA in 2020, persistent atrial fibrillation, systolic heart failure (EF 40-45%), left bundle branch block who was admitted on 01/18/2021 with altered mental status and obstructive uropathy.  Foley has been placed.  Cardiology was consulted for rhythm management in the setting of cirrhosis found on imaging.  Assessment & Plan   Persistent atrial fibrillation -She remains on Eliquis 5 mg twice daily.  This is the appropriate dose given her kidney function and serum creatinine level.  This will need to be closely monitored. -She does have changes suggestive of cirrhosis.  Changes are mild.  LFTs are minimally elevated.  They are coming down. She was seen by Dr. Sallyanne Kuster 10/18/2020 who did not recommend dofetilide due to QT prolonging agents as well as CKD.  She cannot be on other agents such as 1C due to history of CAD and congestive heart failure.  She is not a candidate for dronedarone.  -I doubt she would be a great candidate for outpatient cardiac ablation.  She really has no other options besides amiodarone.  Given that LFTs are minimally elevated we will continue amiodarone for now. -She is intolerant of AV nodal agents.  Likely  pacing.  See discussion below.  2.  Systolic heart failure, EF 40-45% -She has been evaluated by Dr. Sallyanne Kuster on 10/18/2020.  He mentions that her heart failure has been stable.  She is intolerant of AV nodal agents due to hypotension.  ACE/ARB/Arni/MRA could be quite problematic in the setting of CKD as well. -She does have a wide QRS with left bundle branch block.  QRS is greater than 180 ms. -She appears effectively euvolemic. -We will challenge her with hydralazine and Imdur.  This seems to be her only options.  If she does have pacing support she likely will be able to tolerate beta-blockers in the future.  It appears she will need CRT-P to tolerate beta-blockers.  This likely will be beneficial anyway as she has a left bundle branch block with QRS 192 ms.  This can be discussed in the outpatient setting. -For now would hold further diuresis.  She is effectively euvolemic. -We will check an echocardiogram just to make sure there has been no significant change. -Given her EF 40 to 45% she may need to be considered for A. fib ablation.  -Continue amiodarone for rhythm control strategy.  See discussion as above.  3.  CAD status post PCI -Not on a statin at home.  We will likely just hold this that she is on amiodarone with minimal LFT elevation.  A. fib appears to be a bigger issue for her. -We will add Zetia for lipid lowering.  For questions or updates, please contact [place] Please consult [email or web address] for contact info under   Time Spent with Patient: I have spent a total of 35 minutes with patient reviewing hospital notes, telemetry, EKGs, labs and examining the patient as well as establishing an assessment and plan that was discussed with the patient.  > 50% of time was spent in direct patient care.    Signed, Addison Naegeli. Audie Box, MD, [place]  01/23/2021 12:04 PM

## 2021-01-23 NOTE — Progress Notes (Signed)
PROGRESS NOTE    Carrie Monroe  PFX:[id number] DOB: [birth year] DOA: 01/18/2021 PCP: Ginger Organ., MD    Brief Narrative:  85 year old female with history of congestive heart failure, coronary artery disease status post stenting, atrial fibrillation tachybradycardia syndrome, diabetes, chronic kidney disease and anemia recently admitted to the hospital with weakness and pseudogout of the knee was discharged to a skilled nursing rehab 9 days ago brought back to the emergency room with elevated creatinine on routine exam at the skilled nursing facility.  Patient is poor historian.  Patient's daughter tells me that she had been looking weak and tired however she has not noticed anything new.  In the emergency room CT scan abdomen pelvis showed prominent distention of the bladder with bilateral hydronephrosis and hydroureter, changes of hepatic cirrhosis with prominent splenic vein varices and splenomegaly.  Creatinine 2.3 and BUN 137.  A Foley catheter was placed.  Admitted for further work-up.  Recent hospitalization 01/02/2021--01/09/2021 with severe fatigue generalized weakness-found to have acute superimposed on chronic diastolic heart failure as well as decompensated pulmonary hypertension--at that admission palliative care consulted  Assessment & Plan:   Principal Problem:   ARF (acute renal failure) ([place]) Active Problems:   Essential hypertension   CAD- severe 3V CAD    Three-vessel CAD-PCI to LAD and LCx, med management of RCA   S/P TAVR (transcatheter aortic valve replacement)   Aortic stenosis, severe   Persistent atrial fibrillation ([facility])   AKI (acute kidney injury) ([place])   Acute encephalopathy  Acute kidney injury on chronic kidney disease stage 3a: Baseline creatinine about 1.5.  Presented with creatinine of 2.35.  CT abdomen at admission showed prominent urinary bladder with bilateral hydronephrosis and hydroureter, probably postobstructive and dehydration.  Renal  function now at baseline now.  Continue and Discharge with Foley catheter until patient has good clinical improvement of her mobility.  Will start on Flomax.  Acute metabolic encephalopathy in a patient with multiple medical problems: CT head unremarkable.  Likely metabolic from uremia and UTI.  Urea 135 on presentation.  Trending down.  Ammonia is normal.  Continue to monitor. B 12 and folic acid is normal.  She is fully alert and oriented.  UTI: Urinalysis with more than 80,000 Pseudomonas, received cefepime for 5 days, ended on 01/22/2021.  Elevated LFTs with CT scan concerning for hepatic cirrhosis: Hepatitis panel negative.  Mild abnormal LFTs, cardiology discontinued statin and started her on Zetia.  Patient also on amiodarone.  Per cardiology, this needs to be continued as she does not have any other options for her atrial fibrillation.  Permanent A. fib on amiodarone and Eliquis: Continue amiodarone.  Therapeutic on Eliquis.  Repeat EKG consistent with chronic left bundle branch block pattern.  Cardiology on board and managing.  Appreciate their help.  Chronic combined heart failure: Without exacerbation.  Euvolemic other than some bilateral lower extremity edema but no pulmonary edema.  Received IV fluids.  Cardiology was consulted for this.  She received 1 dose of Lasix yesterday.  Renal function is stable.  No further diuretics planned by cardiology.  Appreciate their help.  Essential hypertension: Blood pressure controlled.  Continue hydralazine and Imdur.  Hyperlipidemia: Crestor was switched to Zetia by cardiology due to elevated LFTs and the need for continuation of amiodarone in order to protect liver  Debility: PT OT recommends SNF.  TOC on board for placement   DVT prophylaxis: apixaban (ELIQUIS) tablet 2.5 mg Start: 01/19/21 1200 apixaban (ELIQUIS) tablet 2.5 mg  Code Status: DNR Family Communication: None at bedside. Disposition Plan: Status is: Inpatient.   Dispo: The  patient is from: SNF              Anticipated d/c is to: SNF              Patient currently is not medically stable.   Difficult to place patient No    Consultants:  None  Procedures:  None  Antimicrobials:  Rocephin 8/24--- 8/25 Cefepime 8/26--01/22/2021   Subjective: Patient seen and examined.  She has no complaints.  Objective: Vitals:   01/23/21 0102 01/23/21 0428 01/23/21 0806 01/23/21 1239  BP: (!) 133/46 (!) 135/49 (!) 133/52 (!) 132/47  Pulse: (!) 59 61 63   Resp: (!) 22 20    Temp: 99.5 F (37.5 C) 99.3 F (37.4 C) 98.9 F (37.2 C)   TempSrc: Oral Oral Axillary   SpO2: 97% 98% 95%   Weight:      Height:        Intake/Output Summary (Last 24 hours) at 01/23/2021 1243 Last data filed at 01/23/2021 1146 Gross per 24 hour  Intake 458 ml  Output 3700 ml  Net -3242 ml    Filed Weights   01/18/21 1747  Weight: 77.4 kg    Examination:  General exam: Appears calm and comfortable  Respiratory system: Clear to auscultation. Respiratory effort normal. Cardiovascular system: S1 & S2 heard, RRR. No JVD, murmurs, rubs, gallops or clicks.  +1 pitting edema bilateral lower extremity Gastrointestinal system: Abdomen is nondistended, soft and nontender. No organomegaly or masses felt. Normal bowel sounds heard. Central nervous system: Alert and oriented. No focal neurological deficits. Extremities: Symmetric 5 x 5 power. Skin: No rashes, lesions or ulcers.     Data Reviewed: I have personally reviewed following labs and imaging studies  CBC: Recent Labs  Lab 01/19/21 0217 01/20/21 0139 01/21/21 0314 01/22/21 0515 01/23/21 0638  WBC 6.8 5.8 4.9 5.7 6.1  NEUTROABS  --  4.5 3.5 4.5 4.8  HGB 7.9* 7.3* 7.5* 7.2* 8.9*  HCT 25.2* 24.1* 24.5* 24.3* 28.4*  MCV 100.4* 101.7* 101.7* 101.3* 99.0  PLT 138* 112* 99* 95* 94*    Basic Metabolic Panel: Recent Labs  Lab 01/19/21 0217 01/20/21 0139 01/21/21 0314 01/22/21 0515 01/23/21 0638  NA 140 146* 147* 144  145  K 4.2 3.4* 3.8 3.8 3.6  CL 111 118* 119* 118* 120*  CO2 23 24 24 22  21*  GLUCOSE 126* 163* 146* 130* 120*  BUN 125* 98* 78* 56* 52*  CREATININE 2.18* 1.72* 1.39* 1.16* 1.24*  CALCIUM 9.2 8.9 8.9 8.9 8.9  MG  --  2.4 2.2  --   --   PHOS  --  3.3 2.4*  --   --     GFR: Estimated Creatinine Clearance: 33.7 mL/min (A) (by C-G formula based on SCr of 1.24 mg/dL (H)). Liver Function Tests: Recent Labs  Lab 01/18/21 1827 01/19/21 0217 01/21/21 0314 01/22/21 0515 01/23/21 0638  AST 106* 110* 102* 91* 80*  ALT 104* 101* 104* 102* 95*  ALKPHOS 108 88 89 106 118  BILITOT 0.8 0.8 0.6 0.6 1.1  PROT 6.0* 5.3* 4.6* 4.6* 4.7*  ALBUMIN 2.1* 1.8* 1.5* 1.5* 1.5*    Recent Labs  Lab 01/18/21 1827  LIPASE 49    Recent Labs  Lab 01/19/21 0217  AMMONIA 13    Coagulation Profile: Recent Labs  Lab 01/19/21 0217 01/23/21 0638  INR 1.8* 1.4*    Cardiac  Enzymes: No results for input(s): CKTOTAL, CKMB, CKMBINDEX, TROPONINI in the last 168 hours. BNP (last 3 results) No results for input(s): PROBNP in the last 8760 hours. HbA1C: No results for input(s): HGBA1C in the last 72 hours. CBG: Recent Labs  Lab 01/22/21 1540 01/22/21 2029 01/23/21 0551 01/23/21 0744 01/23/21 1132  GLUCAP 158* 118* 116* 119* 111*    Lipid Profile: No results for input(s): CHOL, HDL, LDLCALC, TRIG, CHOLHDL, LDLDIRECT in the last 72 hours. Thyroid Function Tests: No results for input(s): TSH, T4TOTAL, FREET4, T3FREE, THYROIDAB in the last 72 hours. Anemia Panel: No results for input(s): VITAMINB12, FOLATE, FERRITIN, TIBC, IRON, RETICCTPCT in the last 72 hours.  Sepsis Labs: No results for input(s): PROCALCITON, LATICACIDVEN in the last 168 hours.  Recent Results (from the past 240 hour(s))  Resp Panel by RT-PCR (Flu A&B, Covid) Nasopharyngeal Swab     Status: None   Collection Time: 01/18/21  6:27 PM   Specimen: Nasopharyngeal Swab; Nasopharyngeal(NP) swabs in vial transport medium   Result Value Ref Range Status   SARS Coronavirus 2 by RT PCR NEGATIVE NEGATIVE Final    Comment: (NOTE) SARS-CoV-2 target nucleic acids are NOT DETECTED.  The SARS-CoV-2 RNA is generally detectable in upper respiratory specimens during the acute phase of infection. The lowest concentration of SARS-CoV-2 viral copies this assay can detect is 138 copies/mL. A negative result does not preclude SARS-Cov-2 infection and should not be used as the sole basis for treatment or other patient management decisions. A negative result may occur with  improper specimen collection/handling, submission of specimen other than nasopharyngeal swab, presence of viral mutation(s) within the areas targeted by this assay, and inadequate number of viral copies(<138 copies/mL). A negative result must be combined with clinical observations, patient history, and epidemiological information. The expected result is Negative.  Fact Sheet for Patients:  EntrepreneurPulse.com.au  Fact Sheet for Healthcare Providers:  IncredibleEmployment.be  This test is no t yet approved or cleared by the [place] FDA and  has been authorized for detection and/or diagnosis of SARS-CoV-2 by FDA under an Emergency Use Authorization (EUA). This EUA will remain  in effect (meaning this test can be used) for the duration of the COVID-19 declaration under Section 564(b)(1) of the Act, 21 U.S.C.section 360bbb-3(b)(1), unless the authorization is terminated  or revoked sooner.       Influenza A by PCR NEGATIVE NEGATIVE Final   Influenza B by PCR NEGATIVE NEGATIVE Final    Comment: (NOTE) The Xpert Xpress SARS-CoV-2/FLU/RSV plus assay is intended as an aid in the diagnosis of influenza from Nasopharyngeal swab specimens and should not be used as a sole basis for treatment. Nasal washings and aspirates are unacceptable for Xpert Xpress SARS-CoV-2/FLU/RSV testing.  Fact Sheet for  Patients: EntrepreneurPulse.com.au  Fact Sheet for Healthcare Providers: IncredibleEmployment.be  This test is not yet approved or cleared by the [place] FDA and has been authorized for detection and/or diagnosis of SARS-CoV-2 by FDA under an Emergency Use Authorization (EUA). This EUA will remain in effect (meaning this test can be used) for the duration of the COVID-19 declaration under Section 564(b)(1) of the Act, 21 U.S.C. section 360bbb-3(b)(1), unless the authorization is terminated or revoked.  Performed at [facility], [place] [address], [place], [place] [address]   Urine Culture     Status: Abnormal   Collection Time: 01/18/21 11:10 PM   Specimen: Urine, Clean Catch  Result Value Ref Range Status   Specimen Description URINE, CLEAN CATCH  Final   Special Requests   Final    NONE Performed at [facility], [place] [address], [place], [place] [address]    Culture 80,000 COLONIES/mL PSEUDOMONAS AERUGINOSA (A)  Final   Report Status 01/21/2021 FINAL  Final   Organism ID, Bacteria PSEUDOMONAS AERUGINOSA (A)  Final      Susceptibility   Pseudomonas aeruginosa - MIC*    CEFTAZIDIME 4 SENSITIVE Sensitive     CIPROFLOXACIN <=0.25 SENSITIVE Sensitive     GENTAMICIN 2 SENSITIVE Sensitive     IMIPENEM 2 SENSITIVE Sensitive     PIP/TAZO 8 SENSITIVE Sensitive     CEFEPIME 2 SENSITIVE Sensitive     * 80,000 COLONIES/mL PSEUDOMONAS AERUGINOSA          Radiology Studies: US Abdomen Complete  Result Date: 01/23/2021 CLINICAL DATA:  History of cirrhosis EXAM: ABDOMEN ULTRASOUND COMPLETE COMPARISON:  01/18/2021 FINDINGS: Gallbladder: Surgically removed Common bile duct: Diameter: 5.5 mm Liver: Minimal nodularity of the liver is noted without focal mass. These changes are consistent with the given clinical history of cirrhosis. Portal vein is patent on color Doppler imaging with normal direction of blood flow towards the liver. IVC:  No abnormality visualized. Pancreas: Visualized portion unremarkable. Spleen: Prominent at 14.8 cm. Right Kidney: Length: 11.9 cm. Echogenicity within normal limits. No mass or hydronephrosis visualized. 2.9 cm cyst is noted in the mid to upper pole. Previously seen hydronephrotic changes have resolved likely related to bladder decompression. Left Kidney: Length: 10.6 cm. Mild cortical thinning is noted. No mass lesion is noted. Hydronephrosis has resolved related to bladder decompression. Abdominal aorta: No aneurysm visualized. Other findings: None. IMPRESSION: Mild changes of cirrhosis. Right renal cysts stable from prior CT. Resolution of previously seen bilateral hydronephrosis following bladder decompression. Electronically Signed   By: Inez Catalina M.D.   On: 01/23/2021 08:20        Scheduled Meds:  sodium chloride   Intravenous Once   amiodarone  200 mg Oral Daily   apixaban  2.5 mg Oral BID   Chlorhexidine Gluconate Cloth  6 each Topical Daily   ezetimibe  10 mg Oral Daily   ferrous sulfate  325 mg Oral Daily   hydrALAZINE  10 mg Oral Q8H   isosorbide mononitrate  15 mg Oral Daily   vitamin B-12  500 mcg Oral Daily   Continuous Infusions:     LOS: 4 days   Time spent: 32 minutes  Darliss Cheney, MD [facility]

## 2021-01-23 NOTE — Progress Notes (Signed)
  Echocardiogram 2D Echocardiogram has been performed.  Merrie Roof F 01/23/2021, 4:52 PM

## 2021-01-24 DIAGNOSIS — I5023 Acute on chronic systolic (congestive) heart failure: Secondary | ICD-10-CM | POA: Diagnosis not present

## 2021-01-24 DIAGNOSIS — Z952 Presence of prosthetic heart valve: Secondary | ICD-10-CM | POA: Diagnosis not present

## 2021-01-24 DIAGNOSIS — I340 Nonrheumatic mitral (valve) insufficiency: Secondary | ICD-10-CM

## 2021-01-24 DIAGNOSIS — K7460 Unspecified cirrhosis of liver: Secondary | ICD-10-CM

## 2021-01-24 DIAGNOSIS — I5022 Chronic systolic (congestive) heart failure: Secondary | ICD-10-CM

## 2021-01-24 DIAGNOSIS — I4819 Other persistent atrial fibrillation: Secondary | ICD-10-CM

## 2021-01-24 DIAGNOSIS — N139 Obstructive and reflux uropathy, unspecified: Secondary | ICD-10-CM

## 2021-01-24 DIAGNOSIS — Z7189 Other specified counseling: Secondary | ICD-10-CM

## 2021-01-24 DIAGNOSIS — I330 Acute and subacute infective endocarditis: Secondary | ICD-10-CM | POA: Diagnosis not present

## 2021-01-24 DIAGNOSIS — N189 Chronic kidney disease, unspecified: Secondary | ICD-10-CM

## 2021-01-24 LAB — BLOOD CULTURE ID PANEL (REFLEXED) - BCID2

## 2021-01-24 LAB — COMPREHENSIVE METABOLIC PANEL
ALT: 80 U/L — ABNORMAL HIGH (ref 0–44)
AST: 62 U/L — ABNORMAL HIGH (ref 15–41)
Albumin: 1.4 g/dL — ABNORMAL LOW (ref 3.5–5.0)
Alkaline Phosphatase: 111 U/L (ref 38–126)
Anion gap: 3 — ABNORMAL LOW (ref 5–15)
BUN: 50 mg/dL — ABNORMAL HIGH (ref 8–23)
CO2: 23 mmol/L (ref 22–32)
Calcium: 8.9 mg/dL (ref 8.9–10.3)
Chloride: 120 mmol/L — ABNORMAL HIGH (ref 98–111)
Creatinine, Ser: 1.23 mg/dL — ABNORMAL HIGH (ref 0.44–1.00)
GFR, Estimated: 44 mL/min — ABNORMAL LOW (ref >60)
Glucose, Bld: 131 mg/dL — ABNORMAL HIGH (ref 70–99)
Potassium: 3.6 mmol/L (ref 3.5–5.1)
Sodium: 146 mmol/L — ABNORMAL HIGH (ref 135–145)
Total Bilirubin: 0.8 mg/dL (ref 0.3–1.2)
Total Protein: 4.6 g/dL — ABNORMAL LOW (ref 6.5–8.1)

## 2021-01-24 LAB — BPAM RBC
Blood Product Expiration Date: 202209212359
ISSUE DATE / TIME: 202208290042
Unit Type and Rh: 6200

## 2021-01-24 LAB — CBC WITH DIFFERENTIAL/PLATELET
Abs Immature Granulocytes: 0.04 10*3/uL (ref 0.00–0.07)
Basophils Absolute: 0 10*3/uL (ref 0.0–0.1)
Basophils Relative: 0 %
Eosinophils Absolute: 0.1 10*3/uL (ref 0.0–0.5)
Eosinophils Relative: 2 %
HCT: 26.4 % — ABNORMAL LOW (ref 36.0–46.0)
Hemoglobin: 8.1 g/dL — ABNORMAL LOW (ref 12.0–15.0)
Immature Granulocytes: 1 %
Lymphocytes Relative: 10 %
Lymphs Abs: 0.7 10*3/uL (ref 0.7–4.0)
MCH: 30.5 pg (ref 26.0–34.0)
MCHC: 30.7 g/dL (ref 30.0–36.0)
MCV: 99.2 fL (ref 80.0–100.0)
Monocytes Absolute: 0.4 10*3/uL (ref 0.1–1.0)
Monocytes Relative: 6 %
Neutro Abs: 5.2 10*3/uL (ref 1.7–7.7)
Neutrophils Relative %: 81 %
Platelets: 89 10*3/uL — ABNORMAL LOW (ref 150–400)
RBC: 2.66 MIL/uL — ABNORMAL LOW (ref 3.87–5.11)
RDW: 16.8 % — ABNORMAL HIGH (ref 11.5–15.5)
WBC: 6.4 10*3/uL (ref 4.0–10.5)
nRBC: 0 % (ref 0.0–0.2)

## 2021-01-24 LAB — TYPE AND SCREEN
ABO/RH(D): A POS
Antibody Screen: NEGATIVE
Unit division: 0

## 2021-01-24 LAB — GLUCOSE, CAPILLARY
Glucose-Capillary: 116 mg/dL — ABNORMAL HIGH (ref 70–99)
Glucose-Capillary: 125 mg/dL — ABNORMAL HIGH (ref 70–99)
Glucose-Capillary: 132 mg/dL — ABNORMAL HIGH (ref 70–99)
Glucose-Capillary: 138 mg/dL — ABNORMAL HIGH (ref 70–99)
Glucose-Capillary: 140 mg/dL — ABNORMAL HIGH (ref 70–99)
Glucose-Capillary: 187 mg/dL — ABNORMAL HIGH (ref 70–99)

## 2021-01-24 MED ORDER — ADULT MULTIVITAMIN W/MINERALS CH
1.0000 | ORAL_TABLET | Freq: Every day | ORAL | Status: DC
  Administered 2021-01-24 – 2021-01-31 (×8): 1 via ORAL
  Filled 2021-01-24 (×8): qty 1

## 2021-01-24 MED ORDER — SODIUM CHLORIDE 0.9 % IV SOLN
2.0000 g | Freq: Four times a day (QID) | INTRAVENOUS | Status: DC
  Administered 2021-01-24 – 2021-01-31 (×28): 2 g via INTRAVENOUS
  Filled 2021-01-24 (×30): qty 2000

## 2021-01-24 MED ORDER — SODIUM CHLORIDE 0.9 % IV SOLN
2.0000 g | Freq: Two times a day (BID) | INTRAVENOUS | Status: DC
  Administered 2021-01-24 – 2021-01-31 (×15): 2 g via INTRAVENOUS
  Filled 2021-01-24 (×16): qty 20

## 2021-01-24 MED ORDER — ENSURE ENLIVE PO LIQD
237.0000 mL | Freq: Three times a day (TID) | ORAL | Status: DC
  Administered 2021-01-24 – 2021-01-31 (×17): 237 mL via ORAL

## 2021-01-24 NOTE — Consult Note (Signed)
[facility]       Reason for Consult: Mitral valve infective endocarditis    Referring Physician: Dr. Doristine Bosworth  Principal Problem:   ARF (acute renal failure) [facility]) Active Problems:   Essential hypertension   CAD- severe 3V CAD    Three-vessel CAD-PCI to LAD and LCx, med management of RCA   S/P TAVR (transcatheter aortic valve replacement)   Aortic stenosis, severe   Persistent atrial fibrillation (HCC)   AKI (acute kidney injury) (HCC)   Acute encephalopathy    sodium chloride   Intravenous Once   amiodarone  200 mg Oral Daily   apixaban  2.5 mg Oral BID   Chlorhexidine Gluconate Cloth  6 each Topical Daily   ezetimibe  10 mg Oral Daily   feeding supplement  237 mL Oral TID BM   ferrous sulfate  325 mg Oral Daily   hydrALAZINE  10 mg Oral Q8H   isosorbide mononitrate  15 mg Oral Daily   multivitamin with minerals  1 tablet Oral Daily   tamsulosin  0.4 mg Oral QPC breakfast   vitamin B-12  500 mcg Oral Daily    Recommendations:  Ceftriaxone and ampicillin Will repeat blood cultures  Assessment: 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     Antibiotics: Ceftriaxone and cefepime Starting ampicillin and ceftriaxone  HPI: Carrie Monroe is a 85 y.o. female with a history of CHF, CAD with stenting in the past, Afib, DM and chronic kidney disease with weakness and confusion.  History is obtained from the chart as the patient does not verbalize to me, just shakes head yes or no.  She was recently hospitalized with acute on chronic heart failure.  She underwent a TAVR in 2018 for aortic stenosis.  She was recently found to have cirrhosis of the liver on CT scan.  She has been uremic.     Review of Systems:  Constitutional: negative for fevers and chills Gastrointestinal: negative for diarrhea All other systems reviewed and are negative    Past Medical History:   Diagnosis Date   Aortic stenosis, severe    a. 01/2017: s/p TAVR; hospital course complicated by large groin hematoma/wound   Arthritis    "fingers" (11/06/2017)   Cancer of left breast ([place]) 2009   s/p lumpectomy and XRT   Carotid artery stenosis    a. s/p R TCAR 02/2019 followed by [facility].   CKD (chronic kidney disease)    Coronary artery disease    a. 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 10/2019 Echo: EF 40-45%, glo HK. RVSP 26mmHg. Mildly reduced RV fxn. Sev BAE. Mild to mod MR. Nl fxn AoV prosthesis.   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   26 mm Medtronic CorValve Evolut Pro transcatheter heart valve  placed via percutaneous left transfemoral approach    Squamous carcinoma 10/2017   "scalp"   Thrombocytopenia ([place])     Social History   Tobacco Use   Smoking status: Never   Smokeless tobacco: Never  Vaping Use   Vaping Use: Never used  Substance Use Topics   Alcohol use: Never   Drug use: Never    Family History  Problem Relation Age of Onset   Cancer Mother        pancreatic   Heart disease Father    Hypertension Father    Other Father        dialysis   Cancer Brother    Diabetes Sister    Sudden death Brother        age 5    Allergies  Allergen Reactions   Oxycodone Other (See Comments)    Hallunication    Physical Exam: Constitutional: in no apparent distress, fatigued Vitals:   01/23/21 2105 01/24/21 0453  BP:  (!) 130/48 (!) 120/42  Pulse: 70 (!) 58  Resp: (!) 25 (!) 25  Temp: 99.3 F (37.4 C) 99.3 F (37.4 C)  SpO2: 97% 97%   EYES: anicteric ENMT: no thrush Cardiovascular: Cor RRR Respiratory: clear; GI: Bowel sounds are normal, liver is not enlarged, spleen is not enlarged Musculoskeletal: no pedal edema noted Skin: negatives: no rash Neuro: alert, does not verbalize, eyes open and responds appropriately to questions with head nod  Lab Results  Component Value Date   WBC 6.4 01/24/2021   HGB 8.1 (L) 01/24/2021   HCT 26.4 (L) 01/24/2021   MCV 99.2 01/24/2021   PLT 89 (L) 01/24/2021    Lab Results  Component Value Date   CREATININE 1.23 (H) 01/24/2021   BUN 50 (H) 01/24/2021   NA 146 (H) 01/24/2021   K 3.6 01/24/2021   CL 120 (H) 01/24/2021   CO2 23 01/24/2021    Lab Results  Component Value Date   ALT 80 (H) 01/24/2021   AST 62 (H) 01/24/2021   ALKPHOS 111 01/24/2021     Microbiology: Recent Results (from the past 240 hour(s))  Resp Panel by RT-PCR (Flu A&B, Covid) Nasopharyngeal Swab     Status: None   Collection Time: 01/18/21  6:27 PM   Specimen: Nasopharyngeal Swab; Nasopharyngeal(NP) swabs in vial transport medium  Result Value Ref Range Status   SARS Coronavirus 2 by RT PCR NEGATIVE NEGATIVE Final    Comment: (NOTE) SARS-CoV-2 target nucleic acids are NOT DETECTED.  The SARS-CoV-2 RNA is generally detectable in upper respiratory specimens during the acute phase of infection. The lowest concentration of SARS-CoV-2 viral copies this assay can detect is 138 copies/mL. A negative result does not preclude SARS-Cov-2 infection and should not be used as the sole basis for treatment or other patient management decisions. A negative result may occur with  improper specimen collection/handling, submission of specimen other than nasopharyngeal swab, presence of viral mutation(s) within the areas targeted by this assay, and inadequate number of viral copies(<138  copies/mL). A negative result must be combined with clinical observations, patient history, and epidemiological information. The expected result is Negative.  Fact Sheet for Patients:  EntrepreneurPulse.com.au  Fact Sheet for Healthcare Providers:  IncredibleEmployment.be  This test is no t yet approved or cleared by the [place] FDA and  has been authorized for detection and/or diagnosis of SARS-CoV-2 by FDA under an Emergency Use Authorization (EUA). This EUA will remain  in effect (meaning this test can be used)  for the duration of the COVID-19 declaration under Section 564(b)(1) of the Act, 21 U.S.C.section 360bbb-3(b)(1), unless the authorization is terminated  or revoked sooner.       Influenza A by PCR NEGATIVE NEGATIVE Final   Influenza B by PCR NEGATIVE NEGATIVE Final    Comment: (NOTE) The Xpert Xpress SARS-CoV-2/FLU/RSV plus assay is intended as an aid in the diagnosis of influenza from Nasopharyngeal swab specimens and should not be used as a sole basis for treatment. Nasal washings and aspirates are unacceptable for Xpert Xpress SARS-CoV-2/FLU/RSV testing.  Fact Sheet for Patients: EntrepreneurPulse.com.au  Fact Sheet for Healthcare Providers: IncredibleEmployment.be  This test is not yet approved or cleared by the [place] FDA and has been authorized for detection and/or diagnosis of SARS-CoV-2 by FDA under an Emergency Use Authorization (EUA). This EUA will remain in effect (meaning this test can be used) for the duration of the COVID-19 declaration under Section 564(b)(1) of the Act, 21 U.S.C. section 360bbb-3(b)(1), unless the authorization is terminated or revoked.  Performed at [facility], [place] [address], [place], [place] [address]   Urine Culture     Status: Abnormal   Collection Time: 01/18/21 11:10 PM   Specimen: Urine, Clean Catch  Result Value Ref Range  Status   Specimen Description URINE, CLEAN CATCH  Final   Special Requests   Final    NONE Performed at [facility], [place] [address] [place], [place] [address]    Culture 80,000 COLONIES/mL PSEUDOMONAS AERUGINOSA (A)  Final   Report Status 01/21/2021 FINAL  Final   Organism ID, Bacteria PSEUDOMONAS AERUGINOSA (A)  Final      Susceptibility   Pseudomonas aeruginosa - MIC*    CEFTAZIDIME 4 SENSITIVE Sensitive     CIPROFLOXACIN <=0.25 SENSITIVE Sensitive     GENTAMICIN 2 SENSITIVE Sensitive     IMIPENEM 2 SENSITIVE Sensitive     PIP/TAZO 8 SENSITIVE Sensitive     CEFEPIME 2 SENSITIVE Sensitive     * 80,000 COLONIES/mL PSEUDOMONAS AERUGINOSA  Culture, blood (Routine X 2) w Reflex to ID Panel     Status: None (Preliminary result)   Collection Time: 01/23/21  6:09 PM   Specimen: BLOOD  Result Value Ref Range Status   Specimen Description BLOOD RIGHT ANTECUBITAL  Final   Special Requests   Final    BOTTLES DRAWN AEROBIC AND ANAEROBIC Blood Culture adequate volume   Culture  Setup Time   Final    GRAM POSITIVE COCCI IN CHAINS IN BOTH AEROBIC AND ANAEROBIC BOTTLES Organism ID to follow CRITICAL RESULT CALLED TO, READ BACK BY AND VERIFIED WITH: Ronald Pippins [id number] MLM Performed at [facility], [place] [address] [place], [place] [address]    Culture GRAM POSITIVE COCCI  Final   Report Status PENDING  Incomplete  Culture, blood (Routine X 2) w Reflex to ID Panel     Status: None (Preliminary result)   Collection Time: 01/23/21  6:09 PM   Specimen: BLOOD RIGHT HAND  Result Value Ref Range Status   Specimen Description BLOOD RIGHT HAND  Final   Special Requests   Final    BOTTLES DRAWN AEROBIC AND ANAEROBIC Blood Culture adequate volume   Culture  Setup Time   Final    GRAM POSITIVE COCCI IN CHAINS IN BOTH AEROBIC AND ANAEROBIC BOTTLES CRITICAL VALUE NOTED.  VALUE IS CONSISTENT WITH PREVIOUSLY REPORTED AND CALLED VALUE. Performed at [facility], [place]  [address]  [place], [place] [address]    Culture GRAM POSITIVE COCCI  Final   Report Status PENDING  Incomplete  Blood Culture ID Panel (Reflexed)     Status: Abnormal   Collection Time: 01/23/21  6:09 PM  Result Value Ref Range Status   Enterococcus faecalis DETECTED (A) NOT DETECTED Final    Comment: CRITICAL RESULT CALLED TO, READ BACK BY AND VERIFIED WITH: PHARMD Mervyn Gay [id number] MLM    Enterococcus Faecium NOT DETECTED NOT DETECTED Final   Listeria monocytogenes NOT DETECTED NOT DETECTED Final   Staphylococcus species NOT DETECTED NOT DETECTED Final   Staphylococcus aureus (BCID) NOT DETECTED NOT DETECTED Final   Staphylococcus epidermidis NOT DETECTED NOT DETECTED Final   Staphylococcus lugdunensis NOT DETECTED NOT DETECTED Final   Streptococcus species NOT DETECTED NOT DETECTED Final   Streptococcus agalactiae NOT DETECTED NOT DETECTED Final   Streptococcus pneumoniae NOT DETECTED NOT DETECTED Final   Streptococcus pyogenes NOT DETECTED NOT DETECTED Final   A.calcoaceticus-baumannii NOT DETECTED NOT DETECTED Final   Bacteroides fragilis NOT DETECTED NOT DETECTED Final   Enterobacterales NOT DETECTED NOT DETECTED Final   Enterobacter cloacae complex NOT DETECTED NOT DETECTED Final   Escherichia coli NOT DETECTED NOT DETECTED Final   Klebsiella aerogenes NOT DETECTED NOT DETECTED Final   Klebsiella oxytoca NOT DETECTED NOT DETECTED Final   Klebsiella pneumoniae NOT DETECTED NOT DETECTED Final   Proteus species NOT DETECTED NOT DETECTED Final   Salmonella species NOT DETECTED NOT DETECTED Final   Serratia marcescens NOT DETECTED NOT DETECTED Final   Haemophilus influenzae NOT DETECTED NOT DETECTED Final   Neisseria meningitidis NOT DETECTED NOT DETECTED Final   Pseudomonas aeruginosa NOT DETECTED NOT DETECTED Final   Stenotrophomonas maltophilia NOT DETECTED NOT DETECTED Final   Candida albicans NOT DETECTED NOT DETECTED Final   Candida auris NOT DETECTED NOT DETECTED Final   Candida  glabrata NOT DETECTED NOT DETECTED Final   Candida krusei NOT DETECTED NOT DETECTED Final   Candida parapsilosis NOT DETECTED NOT DETECTED Final   Candida tropicalis NOT DETECTED NOT DETECTED Final   Cryptococcus neoformans/gattii NOT DETECTED NOT DETECTED Final   Vancomycin resistance NOT DETECTED NOT DETECTED Final    Comment: Performed at [facility], [address] [place], [place] [address]    Aquarius Latouche W Yunior Jain, [facility] [email or web address] 01/24/2021, 12:00 PM

## 2021-01-24 NOTE — Consult Note (Signed)
   [facility] Inpatient Consult   01/24/2021  MYRIAH BOGGUS [birth year] [id number]  [place] Organization [ACO] Patient: [place] [employer]   Primary Care Provider:  Ginger Organ., MD   Reviewed for less than 30 days readmission with extreme high risk for unplanned readmission score. Reviewed for post hospital transition of care [TOC] needs and plan.  Patient is being recommended to return to a skilled nursing facility [SNF] level of care. Patient TOC needs are to be met at the SNF level of care no follow up with [facility] planned at this time.  For questions or referrals, please contact:   Natividad Brood, RN BSN [facility] Liaison  [phone] business mobile phone Toll free office [phone]  Fax number: [phone] [email or web address]

## 2021-01-24 NOTE — TOC Progression Note (Signed)
Transition of Care [facility]) - Progression Note    Patient Details  Name: Carrie Monroe MRN: [id number] Date of Birth: 1936/03/12  Transition of Care [facility]) CM/SW [place], [place] Phone Number: 01/24/2021, 3:11 PM  Clinical Narrative:     CSW following patient for SNF placement. CSW will start insurance auth close to patient being medically ready for dc. Patient has SN[facility] bed at [facility]. CSW will continue to follow and assist with dc planning needs.  Expected Discharge Plan: [place] Barriers to Discharge: Continued Medical Work up  Expected Discharge Plan and Services Expected Discharge Plan: [place] In-house Referral: Clinical Social Work     Living arrangements for the past 2 months: [place]                                       Social Determinants of Health (SDOH) Interventions    Readmission Risk Interventions Readmission Risk Prevention Plan 03/17/2020  Transportation Screening Complete  PCP or Specialist Appt within 3-5 Days Complete  HRI or [place] Complete  Social Work Consult for [place] Planning/Counseling Complete  Palliative Care Screening Not Applicable  Medication Review Press photographer) Complete  Some recent data might be hidden

## 2021-01-24 NOTE — Progress Notes (Signed)
PROGRESS NOTE    Carrie Monroe  AOZ:[id number] DOB: 1936-04-09 DOA: 01/18/2021 PCP: Ginger Organ., MD    Brief Narrative:  85 year old female with history of congestive heart failure, coronary artery disease status post stenting, atrial fibrillation tachybradycardia syndrome, diabetes, chronic kidney disease and anemia recently admitted to the hospital with weakness and pseudogout of the knee was discharged to a skilled nursing rehab 9 days ago brought back to the emergency room with elevated creatinine on routine exam at the skilled nursing facility.  Patient is poor historian.  Patient's daughter tells me that she had been looking weak and tired however she has not noticed anything new.  In the emergency room CT scan abdomen pelvis showed prominent distention of the bladder with bilateral hydronephrosis and hydroureter, changes of hepatic cirrhosis with prominent splenic vein varices and splenomegaly.  Creatinine 2.3 and BUN 137.  A Foley catheter was placed.  Admitted for further work-up.  Recent hospitalization 01/02/2021--01/09/2021 with severe fatigue generalized weakness-found to have acute superimposed on chronic diastolic heart failure as well as decompensated pulmonary hypertension--at that admission palliative care consulted  Assessment & Plan:   Principal Problem:   ARF (acute renal failure) ([place]) Active Problems:   Essential hypertension   CAD- severe 3V CAD    Three-vessel CAD-PCI to LAD and LCx, med management of RCA   S/P TAVR (transcatheter aortic valve replacement)   Aortic stenosis, severe   Persistent atrial fibrillation (H[facility])   AKI (acute kidney injury) ([place])   Acute encephalopathy  Acute kidney injury on chronic kidney disease stage 3a: Baseline creatinine about 1.5.  Presented with creatinine of 2.35.  CT abdomen at admission showed prominent urinary bladder with bilateral hydronephrosis and hydroureter, probably postobstructive and dehydration.  Renal  function now at baseline now.  Continue and Discharge with Foley catheter until patient has good clinical improvement of her mobility.  Continue Flomax and discharged with it as well.  Acute metabolic encephalopathy in a patient with multiple medical problems: CT head unremarkable.  Likely metabolic from uremia and UTI.  Urea 135 on presentation.  Trending down.  Ammonia is normal. B 12 and folic acid is normal.  Patient is alert but extremely weak, too weak to even hold a conversation.  She just states that she is not feeling well but could not elaborate further.  Likely secondary to bacteremia.  Enterococcus bacteremia/bacterial endocarditis: Transthoracic echo yesterday shows large mitral valve vegetation/endocarditis.  Blood cultures were drawn yesterday which are now growing Enterococcus faecalis.  Patient has been started on Rocephin and ampicillin.  We have consulted ID.  Cardiology to discuss with the family about further options and consult cardiothoracic surgery.  UTI: Urinalysis with more than 80,000 Pseudomonas, received cefepime for 5 days, ended on 01/22/2021.  Elevated LFTs with CT scan concerning for hepatic cirrhosis: Hepatitis panel negative.  Mild abnormal LFTs, cardiology discontinued statin and started her on Zetia.  Patient also on amiodarone.  Per cardiology, this needs to be continued as she does not have any other options for her atrial fibrillation.  LFTs improving.  Permanent A. fib on amiodarone and Eliquis: Continue amiodarone.  Therapeutic on Eliquis.  Repeat EKG consistent with chronic left bundle branch block pattern.  Cardiology on board and managing.  Appreciate their help.  Chronic combined heart failure: Without exacerbation.  Euvolemic other than some bilateral lower extremity edema but no pulmonary edema.  Received IV fluids.  Cardiology was consulted for this.  She received 1 dose of Lasix on  01/22/2021.  Renal function is stable.  No further diuretics planned by  cardiology.  Appreciate their help.  Essential hypertension: Blood pressure controlled.  Continue hydralazine and Imdur.  Hyperlipidemia: Crestor was switched to Zetia by cardiology on 01/23/2021 due to elevated LFTs and the need for continuation of amiodarone in order to protect liver  Debility: PT OT recommends SNF.  TOC on board for placement   DVT prophylaxis: apixaban (ELIQUIS) tablet 2.5 mg Start: 01/19/21 1200 apixaban (ELIQUIS) tablet 2.5 mg   Code Status: DNR Family Communication: None at bedside.  Discussed in length with daughter-in-law Yamaris Cummings.  Consulting palliative care with her agreement. Disposition Plan: Status is: Inpatient.   Dispo: The patient is from: SNF              Anticipated d/c is to: SNF              Patient currently is not medically stable.   Difficult to place patient No    Consultants:  None  Procedures:  None  Antimicrobials:  Rocephin 8/24--- 8/25 Cefepime 8/26--01/22/2021 Rocephin 01/24/2021> Ampicillin 01/24/2021>   Subjective: Patient seen and examined.  Sitting in the recliner.  SLP at the bedside.  Patient too lethargic to hold any conversation but easily arousable.  She states that she is not feeling well but could not elaborate any specific symptoms.  Objective: Vitals:   01/23/21 1746 01/23/21 1800 01/23/21 2105 01/24/21 0453  BP: (!) 126/47  (!) 130/48 (!) 120/42  Pulse: 67 69 70 (!) 58  Resp: (!) 26 18 (!) 25 (!) 25  Temp: 98.3 F (36.8 C)  99.3 F (37.4 C) 99.3 F (37.4 C)  TempSrc: Oral  Oral Oral  SpO2: 96% 97% 97% 97%  Weight:      Height:        Intake/Output Summary (Last 24 hours) at 01/24/2021 1021 Last data filed at 01/24/2021 0600 Gross per 24 hour  Intake --  Output 1500 ml  Net -1500 ml    Filed Weights   01/18/21 1747 01/23/21 1239  Weight: 77.4 kg 77.2 kg    Examination:  General exam: Appears lethargic but comfortable Respiratory system: Clear to auscultation. Respiratory effort  normal. Cardiovascular system: S1 & S2 heard, RRR. No JVD, murmurs, rubs, gallops but positive for click.  +1 pitting edema bilateral lower extremity Gastrointestinal system: Abdomen is nondistended, soft and nontender. No organomegaly or masses felt. Normal bowel sounds heard. Central nervous system: Lethargic, unable to assess orientation due to her inability to participate in conversation.  No focal deficit. Skin: No rashes, lesions or ulcers.     Data Reviewed: I have personally reviewed following labs and imaging studies  CBC: Recent Labs  Lab 01/20/21 0139 01/21/21 0314 01/22/21 0515 01/23/21 0638 01/23/21 1519 01/24/21 0205  WBC 5.8 4.9 5.7 6.1  --  6.4  NEUTROABS 4.5 3.5 4.5 4.8  --  5.2  HGB 7.3* 7.5* 7.2* 8.9* 8.1* 8.1*  HCT 24.1* 24.5* 24.3* 28.4* 25.9* 26.4*  MCV 101.7* 101.7* 101.3* 99.0  --  99.2  PLT 112* 99* 95* 94*  --  89*    Basic Metabolic Panel: Recent Labs  Lab 01/20/21 0139 01/21/21 0314 01/22/21 0515 01/23/21 0638 01/24/21 0205  NA 146* 147* 144 145 146*  K 3.4* 3.8 3.8 3.6 3.6  CL 118* 119* 118* 120* 120*  CO2 24 24 22  21* 23  GLUCOSE 163* 146* 130* 120* 131*  BUN 98* 78* 56* 52* 50*  CREATININE 1.72* 1.39*  1.16* 1.24* 1.23*  CALCIUM 8.9 8.9 8.9 8.9 8.9  MG 2.4 2.2  --   --   --   PHOS 3.3 2.4*  --   --   --     GFR: Estimated Creatinine Clearance: 33.9 mL/min (A) (by C-G formula based on SCr of 1.23 mg/dL (H)). Liver Function Tests: Recent Labs  Lab 01/19/21 0217 01/21/21 0314 01/22/21 0515 01/23/21 0638 01/24/21 0205  AST 110* 102* 91* 80* 62*  ALT 101* 104* 102* 95* 80*  ALKPHOS 88 89 106 118 111  BILITOT 0.8 0.6 0.6 1.1 0.8  PROT 5.3* 4.6* 4.6* 4.7* 4.6*  ALBUMIN 1.8* 1.5* 1.5* 1.5* 1.4*    Recent Labs  Lab 01/18/21 1827  LIPASE 49    Recent Labs  Lab 01/19/21 0217  AMMONIA 13    Coagulation Profile: Recent Labs  Lab 01/19/21 0217 01/23/21 0638  INR 1.8* 1.4*    Cardiac Enzymes: No results for input(s):  CKTOTAL, CKMB, CKMBINDEX, TROPONINI in the last 168 hours. BNP (last 3 results) No results for input(s): PROBNP in the last 8760 hours. HbA1C: No results for input(s): HGBA1C in the last 72 hours. CBG: Recent Labs  Lab 01/23/21 1738 01/23/21 2127 01/24/21 0015 01/24/21 0411 01/24/21 0746  GLUCAP 133* 145* 132* 125* 116*    Lipid Profile: No results for input(s): CHOL, HDL, LDLCALC, TRIG, CHOLHDL, LDLDIRECT in the last 72 hours. Thyroid Function Tests: No results for input(s): TSH, T4TOTAL, FREET4, T3FREE, THYROIDAB in the last 72 hours. Anemia Panel: No results for input(s): VITAMINB12, FOLATE, FERRITIN, TIBC, IRON, RETICCTPCT in the last 72 hours.  Sepsis Labs: No results for input(s): PROCALCITON, LATICACIDVEN in the last 168 hours.  Recent Results (from the past 240 hour(s))  Resp Panel by RT-PCR (Flu A&B, Covid) Nasopharyngeal Swab     Status: None   Collection Time: 01/18/21  6:27 PM   Specimen: Nasopharyngeal Swab; Nasopharyngeal(NP) swabs in vial transport medium  Result Value Ref Range Status   SARS Coronavirus 2 by RT PCR NEGATIVE NEGATIVE Final    Comment: (NOTE) SARS-CoV-2 target nucleic acids are NOT DETECTED.  The SARS-CoV-2 RNA is generally detectable in upper respiratory specimens during the acute phase of infection. The lowest concentration of SARS-CoV-2 viral copies this assay can detect is 138 copies/mL. A negative result does not preclude SARS-Cov-2 infection and should not be used as the sole basis for treatment or other patient management decisions. A negative result may occur with  improper specimen collection/handling, submission of specimen other than nasopharyngeal swab, presence of viral mutation(s) within the areas targeted by this assay, and inadequate number of viral copies(<138 copies/mL). A negative result must be combined with clinical observations, patient history, and epidemiological information. The expected result is Negative.  Fact  Sheet for Patients:  EntrepreneurPulse.com.au  Fact Sheet for Healthcare Providers:  IncredibleEmployment.be  This test is no t yet approved or cleared by the [place] FDA and  has been authorized for detection and/or diagnosis of SARS-CoV-2 by FDA under an Emergency Use Authorization (EUA). This EUA will remain  in effect (meaning this test can be used) for the duration of the COVID-19 declaration under Section 564(b)(1) of the Act, 21 U.S.C.section 360bbb-3(b)(1), unless the authorization is terminated  or revoked sooner.       Influenza A by PCR NEGATIVE NEGATIVE Final   Influenza B by PCR NEGATIVE NEGATIVE Final    Comment: (NOTE) The Xpert Xpress SARS-CoV-2/FLU/RSV plus assay is intended as an aid in the diagnosis  of influenza from Nasopharyngeal swab specimens and should not be used as a sole basis for treatment. Nasal washings and aspirates are unacceptable for Xpert Xpress SARS-CoV-2/FLU/RSV testing.  Fact Sheet for Patients: EntrepreneurPulse.com.au  Fact Sheet for Healthcare Providers: IncredibleEmployment.be  This test is not yet approved or cleared by the [place] FDA and has been authorized for detection and/or diagnosis of SARS-CoV-2 by FDA under an Emergency Use Authorization (EUA). This EUA will remain in effect (meaning this test can be used) for the duration of the COVID-19 declaration under Section 564(b)(1) of the Act, 21 U.S.C. section 360bbb-3(b)(1), unless the authorization is terminated or revoked.  Performed at [facility], [place] [address], [place], [place] [address]   Urine Culture     Status: Abnormal   Collection Time: 01/18/21 11:10 PM   Specimen: Urine, Clean Catch  Result Value Ref Range Status   Specimen Description URINE, CLEAN CATCH  Final   Special Requests   Final    NONE Performed at [facility], [place] [address] [place], [place] [address]     Culture 80,000 COLONIES/mL PSEUDOMONAS AERUGINOSA (A)  Final   Report Status 01/21/2021 FINAL  Final   Organism ID, Bacteria PSEUDOMONAS AERUGINOSA (A)  Final      Susceptibility   Pseudomonas aeruginosa - MIC*    CEFTAZIDIME 4 SENSITIVE Sensitive     CIPROFLOXACIN <=0.25 SENSITIVE Sensitive     GENTAMICIN 2 SENSITIVE Sensitive     IMIPENEM 2 SENSITIVE Sensitive     PIP/TAZO 8 SENSITIVE Sensitive     CEFEPIME 2 SENSITIVE Sensitive     * 80,000 COLONIES/mL PSEUDOMONAS AERUGINOSA  Culture, blood (Routine X 2) w Reflex to ID Panel     Status: None (Preliminary result)   Collection Time: 01/23/21  6:09 PM   Specimen: BLOOD  Result Value Ref Range Status   Specimen Description BLOOD RIGHT ANTECUBITAL  Final   Special Requests   Final    BOTTLES DRAWN AEROBIC AND ANAEROBIC Blood Culture adequate volume   Culture  Setup Time   Final    GRAM POSITIVE COCCI IN CHAINS IN BOTH AEROBIC AND ANAEROBIC BOTTLES Organism ID to follow CRITICAL RESULT CALLED TO, READ BACK BY AND VERIFIED WITH: Ronald Pippins [id number] Performed at [facility], [place] [address] [place], [place] [address]    Culture GRAM POSITIVE COCCI  Final   Report Status PENDING  Incomplete  Culture, blood (Routine X 2) w Reflex to ID Panel     Status: None (Preliminary result)   Collection Time: 01/23/21  6:09 PM   Specimen: BLOOD RIGHT HAND  Result Value Ref Range Status   Specimen Description BLOOD RIGHT HAND  Final   Special Requests   Final    BOTTLES DRAWN AEROBIC AND ANAEROBIC Blood Culture adequate volume   Culture  Setup Time   Final    GRAM POSITIVE COCCI IN CHAINS IN BOTH AEROBIC AND ANAEROBIC BOTTLES CRITICAL VALUE NOTED.  VALUE IS CONSISTENT WITH PREVIOUSLY REPORTED AND CALLED VALUE. Performed at [facility], [place] [address] [place], [place] [address]    Culture GRAM POSITIVE COCCI  Final   Report Status PENDING  Incomplete  Blood Culture ID Panel (Reflexed)     Status: Abnormal    Collection Time: 01/23/21  6:09 PM  Result Value Ref Range Status   Enterococcus faecalis DETECTED (A) NOT DETECTED Final    Comment: CRITICAL RESULT CALLED TO, READ BACK BY AND  VERIFIED WITH: Ronald Pippins [id number]    Enterococcus Faecium NOT DETECTED NOT DETECTED Final   Listeria monocytogenes NOT DETECTED NOT DETECTED Final   Staphylococcus species NOT DETECTED NOT DETECTED Final   Staphylococcus aureus (BCID) NOT DETECTED NOT DETECTED Final   Staphylococcus epidermidis NOT DETECTED NOT DETECTED Final   Staphylococcus lugdunensis NOT DETECTED NOT DETECTED Final   Streptococcus species NOT DETECTED NOT DETECTED Final   Streptococcus agalactiae NOT DETECTED NOT DETECTED Final   Streptococcus pneumoniae NOT DETECTED NOT DETECTED Final   Streptococcus pyogenes NOT DETECTED NOT DETECTED Final   A.calcoaceticus-baumannii NOT DETECTED NOT DETECTED Final   Bacteroides fragilis NOT DETECTED NOT DETECTED Final   Enterobacterales NOT DETECTED NOT DETECTED Final   Enterobacter cloacae complex NOT DETECTED NOT DETECTED Final   Escherichia coli NOT DETECTED NOT DETECTED Final   Klebsiella aerogenes NOT DETECTED NOT DETECTED Final   Klebsiella oxytoca NOT DETECTED NOT DETECTED Final   Klebsiella pneumoniae NOT DETECTED NOT DETECTED Final   Proteus species NOT DETECTED NOT DETECTED Final   Salmonella species NOT DETECTED NOT DETECTED Final   Serratia marcescens NOT DETECTED NOT DETECTED Final   Haemophilus influenzae NOT DETECTED NOT DETECTED Final   Neisseria meningitidis NOT DETECTED NOT DETECTED Final   Pseudomonas aeruginosa NOT DETECTED NOT DETECTED Final   Stenotrophomonas maltophilia NOT DETECTED NOT DETECTED Final   Candida albicans NOT DETECTED NOT DETECTED Final   Candida auris NOT DETECTED NOT DETECTED Final   Candida glabrata NOT DETECTED NOT DETECTED Final   Candida krusei NOT DETECTED NOT DETECTED Final   Candida parapsilosis NOT DETECTED NOT DETECTED Final   Candida  tropicalis NOT DETECTED NOT DETECTED Final   Cryptococcus neoformans/gattii NOT DETECTED NOT DETECTED Final   Vancomycin resistance NOT DETECTED NOT DETECTED Final    Comment: Performed at [facility], [address] [place], [place] [address]          Radiology Studies: US Abdomen Complete  Result Date: 01/23/2021 CLINICAL DATA:  History of cirrhosis EXAM: ABDOMEN ULTRASOUND COMPLETE COMPARISON:  01/18/2021 FINDINGS: Gallbladder: Surgically removed Common bile duct: Diameter: 5.5 mm Liver: Minimal nodularity of the liver is noted without focal mass. These changes are consistent with the given clinical history of cirrhosis. Portal vein is patent on color Doppler imaging with normal direction of blood flow towards the liver. IVC: No abnormality visualized. Pancreas: Visualized portion unremarkable. Spleen: Prominent at 14.8 cm. Right Kidney: Length: 11.9 cm. Echogenicity within normal limits. No mass or hydronephrosis visualized. 2.9 cm cyst is noted in the mid to upper pole. Previously seen hydronephrotic changes have resolved likely related to bladder decompression. Left Kidney: Length: 10.6 cm. Mild cortical thinning is noted. No mass lesion is noted. Hydronephrosis has resolved related to bladder decompression. Abdominal aorta: No aneurysm visualized. Other findings: None. IMPRESSION: Mild changes of cirrhosis. Right renal cysts stable from prior CT. Resolution of previously seen bilateral hydronephrosis following bladder decompression. Electronically Signed   By: Inez Catalina M.D.   On: 01/23/2021 08:20   ECHOCARDIOGRAM COMPLETE  Result Date: 01/23/2021    ECHOCARDIOGRAM REPORT   Patient Name:   ELISHIA KACZOROWSKI Date of Exam: 01/23/2021 Medical Rec #:  [id number]           Height:       62.0 in Accession #:    [id number]          Weight:       170.2 lb Date of Birth:  1936/01/01  BSA:          1.785 m Patient Age:    61 years            BP:           120/41 mmHg Patient  Gender: F                   HR:           66 bpm. Exam Location:  Inpatient Procedure: 2D Echo, Cardiac Doppler and Color Doppler Indications:    CHF  History:        Patient has prior history of Echocardiogram examinations, most                 recent 11/22/2019. 29mm medtronic valve in the aortic position,                 Arrythmias:Atrial Fibrillation; Risk Factors:Hypertension,                 Diabetes and Dyslipidemia. H/O bacteremia.  Sonographer:    Merrie Roof RDCS Referring Phys: [id number] [place]  1. Left ventricular ejection fraction, by estimation, is 45 to 50%. The left ventricle has mildly decreased function. The left ventricle demonstrates global hypokinesis with septal-lateral dyssynchrony due to LBBB. There is moderate left ventricular hypertrophy. Left ventricular diastolic parameters are consistent with Grade I diastolic dysfunction (impaired relaxation).  2. Right ventricular systolic function is normal. The right ventricular size is normal. Tricuspid regurgitation signal is inadequate for assessing PA pressure.  3. Left atrial size was moderately dilated.  4. Right atrial size was moderately dilated.  5. There is a large 1.45 x 1.45 cm mobile, well-circumscribed mass present on the atrial surface at the base of the posterior mitral valve leaflet. This certainly looks like it could be a myxoma but was was not present on 6/21 echo. Cannot rule out vegetation/endocarditis. The mitral valve is degenerative. Mild to moderate mitral valve regurgitation. No evidence of mitral stenosis. Moderate mitral annular calcification.  6. There is a 26 mm Medtronic CoreValve-EvolutR prosthetic (TAVR) valve present in the aortic position. Mean gradient mildly elevated at 17 mmHg. No perivalvular leakage noted. The aortic valve has been repaired/replaced. Aortic valve regurgitation is not visualized.  7. The inferior vena cava is normal in size with greater than 50% respiratory variability,  suggesting right atrial pressure of 3 mmHg. FINDINGS  Left Ventricle: Left ventricular ejection fraction, by estimation, is 45 to 50%. The left ventricle has mildly decreased function. The left ventricle demonstrates global hypokinesis. The left ventricular internal cavity size was normal in size. There is  moderate left ventricular hypertrophy. Left ventricular diastolic parameters are consistent with Grade I diastolic dysfunction (impaired relaxation). Right Ventricle: The right ventricular size is normal. No increase in right ventricular wall thickness. Right ventricular systolic function is normal. Tricuspid regurgitation signal is inadequate for assessing PA pressure. Left Atrium: Left atrial size was moderately dilated. Right Atrium: Right atrial size was moderately dilated. Pericardium: There is no evidence of pericardial effusion. Mitral Valve: There is a large 1.45 x 1.45 cm mobile, well-circumscribed mass present on the atrial surface at the base of the posterior mitral valve leaflet. This certainly looks like it could be a myxoma but was was not present on 6/21 echo. Cannot rule out vegetation/endocarditis. The mitral valve is degenerative in appearance. There is moderate calcification of the mitral valve leaflet(s). Moderate mitral annular calcification. Mild to moderate mitral valve regurgitation. No  evidence of mitral valve stenosis. Tricuspid Valve: The tricuspid valve is normal in structure. Tricuspid valve regurgitation is not demonstrated. Aortic Valve: There is a 26 mm Medtronic CoreValve-EvolutR prosthetic (TAVR) valve present in the aortic position. Mean gradient mildly elevated at 17 mmHg. No perivalvular leakage noted. The aortic valve has been repaired/replaced. Aortic valve regurgitation is not visualized. Aortic valve mean gradient measures 17.0 mmHg. Aortic valve peak gradient measures 32.9 mmHg. Aortic valve area, by VTI measures 0.98 cm. There is a 26 mm Medtronic-Hall stented (TAVR)  valve present in the aortic position. Pulmonic Valve: The pulmonic valve was normal in structure. Pulmonic valve regurgitation is trivial. Aorta: The aortic root is normal in size and structure. Venous: The inferior vena cava is normal in size with greater than 50% respiratory variability, suggesting right atrial pressure of 3 mmHg. IAS/Shunts: No atrial level shunt detected by color flow Doppler.  LEFT VENTRICLE PLAX 2D LVIDd:         4.70 cm      Diastology LVIDs:         3.40 cm      LV e' medial:  6.20 cm/s LV PW:         1.40 cm      LV e' lateral: 5.11 cm/s LV IVS:        1.40 cm LVOT diam:     1.70 cm LV SV:         56 LV SV Index:   31 LVOT Area:     2.27 cm  LV Volumes (MOD) LV vol d, MOD A4C: 123.0 ml LV vol s, MOD A4C: 41.0 ml LV SV MOD A4C:     123.0 ml RIGHT VENTRICLE RV Basal diam:  4.70 cm RV Mid diam:    3.70 cm LEFT ATRIUM              Index       RIGHT ATRIUM           Index LA diam:        3.80 cm  2.13 cm/m  RA Area:     31.10 cm LA Vol (A2C):   108.0 ml 60.51 ml/m RA Volume:   105.00 ml 58.82 ml/m LA Vol (A4C):   56.6 ml  31.71 ml/m LA Biplane Vol: 80.7 ml  45.21 ml/m  AORTIC VALVE AV Area (Vmax):    0.96 cm AV Area (Vmean):   0.93 cm AV Area (VTI):     0.98 cm AV Vmax:           287.00 cm/s AV Vmean:          189.000 cm/s AV VTI:            0.566 m AV Peak Grad:      32.9 mmHg AV Mean Grad:      17.0 mmHg LVOT Vmax:         122.00 cm/s LVOT Vmean:        77.600 cm/s LVOT VTI:          0.245 m LVOT/AV VTI ratio: 0.43  AORTA Ao Root diam: 3.10 cm MR Peak grad:    105.9 mmHg MR Vmax:         514.50 cm/s SHUNTS MR PISA:         1.01 cm    Systemic VTI:  0.24 m MR PISA Eff ROA: 6 mm       Systemic Diam: 1.70 cm MR PISA Radius:  0.40 cm Dalton  McleanMD Electronically signed by Franki Monte Signature Date/Time: 01/23/2021/5:22:40 PM    Final         Scheduled Meds:  sodium chloride   Intravenous Once   amiodarone  200 mg Oral Daily   apixaban  2.5 mg Oral BID   Chlorhexidine  Gluconate Cloth  6 each Topical Daily   ezetimibe  10 mg Oral Daily   ferrous sulfate  325 mg Oral Daily   hydrALAZINE  10 mg Oral Q8H   isosorbide mononitrate  15 mg Oral Daily   tamsulosin  0.4 mg Oral QPC breakfast   vitamin B-12  500 mcg Oral Daily   Continuous Infusions:  ampicillin (OMNIPEN) IV     cefTRIAXone (ROCEPHIN)  IV        LOS: 5 days   Time spent: 33 minutes  Darliss Cheney, MD [facility]

## 2021-01-24 NOTE — Progress Notes (Signed)
  Speech Language Pathology Treatment: Dysphagia  Patient Details Name: Carrie Monroe MRN: [id number] DOB: [birth year] Today's Date: 01/24/2021 Time: 6168-3729 SLP Time Calculation (min) (ACUTE ONLY): 35 min  Assessment / Plan / Recommendation Clinical Impression  Pt was seen at bedside for follow up after BSE. Recommendation at that time was for puree/thin, crushed meds. Chart review revealed pt currently receiving a regular diet. RN reports poor intake, but tolerance of what limited PO she will accept.  Upon arrival of SLP, pt was resting in recliner. No family present. Pt was noted to be open mouth breathing, resulting in extremely dry oral cavity. Pt allowed gentle oral care, but was clearly uncomfortable during the process despite gentle approach. Pt accepted bites of ice cream and sips of water via straw without anterior leakage or cough response. Given profound weakness, a regular diet may be a bit too aggressive. Will return pt to puree diet for energy conservation. SLP will follow to assess readiness to advance solid textures. RN and MD informed.   Safe swallow precautions at [facility] and whiteboard updated.   HPI HPI: 85 yo F with history of CHF, CAD s/p stenting. A. fib tachybradycardia syndrome, DM, CKD, and anemia who was recently admitted for CHF weakness pseudogout of the knee was discharged to SNF; routine bloodwork revealed elevated creatinine so was transferred to [facility]. Per patient's daughter, patient was also found with AMS.      SLP Plan  Continue with current plan of care       Recommendations  Diet recommendations: Dysphagia 1 (puree);Thin liquid Liquids provided via: Cup;Straw Medication Administration: Crushed with puree Supervision: Full supervision/cueing for compensatory strategies Compensations: Small sips/bites;Slow rate;Minimize environmental distractions Postural Changes and/or Swallow Maneuvers: Seated upright 90 degrees                Oral Care  Recommendations: Oral care QID Follow up Recommendations: 24 hour supervision/assistance SLP Visit Diagnosis: Dysphagia, unspecified (R13.10) Plan: Continue with current plan of care       GO              Shubham Thackston B. Quentin Ore, [facility], [place] Speech Language Pathologist Office: [phone]   Shonna Chock 01/24/2021, 10:24 AM

## 2021-01-24 NOTE — Progress Notes (Signed)
PHARMACY - PHYSICIAN COMMUNICATION CRITICAL VALUE ALERT - BLOOD CULTURE IDENTIFICATION (BCID)  Carrie Monroe is an 85 y.o. female who presented to [facility] from a SNF on 01/18/2021 with a chief complaint of weakness.  Assessment:  Micro lab called to report 4/4 blood cultures positive for E. faecalis. WBC 6.4, SCr 1.23 with CrCl 33.23mL/min. Patient remains afebrile.   Name of physician (or Provider) Contacted: Darliss Cheney, MD  Current antibiotics: none  Changes to prescribed antibiotics recommended:  Start ampicillin 2g q6h IV per BCID protocol.   Results for orders placed or performed during the hospital encounter of 01/18/21  Blood Culture ID Panel (Reflexed) (Collected: 01/23/2021  6:09 PM)  Result Value Ref Range   Enterococcus faecalis DETECTED (A) NOT DETECTED   Enterococcus Faecium NOT DETECTED NOT DETECTED   Listeria monocytogenes NOT DETECTED NOT DETECTED   Staphylococcus species NOT DETECTED NOT DETECTED   Staphylococcus aureus (BCID) NOT DETECTED NOT DETECTED   Staphylococcus epidermidis NOT DETECTED NOT DETECTED   Staphylococcus lugdunensis NOT DETECTED NOT DETECTED   Streptococcus species NOT DETECTED NOT DETECTED   Streptococcus agalactiae NOT DETECTED NOT DETECTED   Streptococcus pneumoniae NOT DETECTED NOT DETECTED   Streptococcus pyogenes NOT DETECTED NOT DETECTED   A.calcoaceticus-baumannii NOT DETECTED NOT DETECTED   Bacteroides fragilis NOT DETECTED NOT DETECTED   Enterobacterales NOT DETECTED NOT DETECTED   Enterobacter cloacae complex NOT DETECTED NOT DETECTED   Escherichia coli NOT DETECTED NOT DETECTED   Klebsiella aerogenes NOT DETECTED NOT DETECTED   Klebsiella oxytoca NOT DETECTED NOT DETECTED   Klebsiella pneumoniae NOT DETECTED NOT DETECTED   Proteus species NOT DETECTED NOT DETECTED   Salmonella species NOT DETECTED NOT DETECTED   Serratia marcescens NOT DETECTED NOT DETECTED   Haemophilus influenzae NOT DETECTED NOT DETECTED   Neisseria  meningitidis NOT DETECTED NOT DETECTED   Pseudomonas aeruginosa NOT DETECTED NOT DETECTED   Stenotrophomonas maltophilia NOT DETECTED NOT DETECTED   Candida albicans NOT DETECTED NOT DETECTED   Candida auris NOT DETECTED NOT DETECTED   Candida glabrata NOT DETECTED NOT DETECTED   Candida krusei NOT DETECTED NOT DETECTED   Candida parapsilosis NOT DETECTED NOT DETECTED   Candida tropicalis NOT DETECTED NOT DETECTED   Cryptococcus neoformans/gattii NOT DETECTED NOT DETECTED   Vancomycin resistance NOT DETECTED NOT DETECTED    Donald Pore, PharmD Pharmacy Resident 01/24/2021, 9:41 AM

## 2021-01-24 NOTE — Progress Notes (Addendum)
Cardiology Progress Note  Patient ID: Carrie Monroe MRN: [id number] DOB: 05-18-1936 Date of Encounter: 01/24/2021  Primary Cardiologist: Sanda Klein, MD  Subjective   Chief Complaint: Weakness and fatigue  HPI: Concerns for mitral valve vegetation echocardiogram.  Cultures are positive.  Likely explains her deterioration.  ROS:  All other ROS reviewed and negative. Pertinent positives noted in the HPI.     Inpatient Medications  Scheduled Meds:  sodium chloride   Intravenous Once   amiodarone  200 mg Oral Daily   apixaban  2.5 mg Oral BID   Chlorhexidine Gluconate Cloth  6 each Topical Daily   ezetimibe  10 mg Oral Daily   feeding supplement  237 mL Oral TID BM   ferrous sulfate  325 mg Oral Daily   hydrALAZINE  10 mg Oral Q8H   isosorbide mononitrate  15 mg Oral Daily   multivitamin with minerals  1 tablet Oral Daily   tamsulosin  0.4 mg Oral QPC breakfast   vitamin B-12  500 mcg Oral Daily   Continuous Infusions:  ampicillin (OMNIPEN) IV     cefTRIAXone (ROCEPHIN)  IV     PRN Meds:    Vital Signs   Vitals:   01/23/21 1746 01/23/21 1800 01/23/21 2105 01/24/21 0453  BP: (!) 126/47  (!) 130/48 (!) 120/42  Pulse: 67 69 70 (!) 58  Resp: (!) 26 18 (!) 25 (!) 25  Temp: 98.3 F (36.8 C)  99.3 F (37.4 C) 99.3 F (37.4 C)  TempSrc: Oral  Oral Oral  SpO2: 96% 97% 97% 97%  Weight:      Height:        Intake/Output Summary (Last 24 hours) at 01/24/2021 1159 Last data filed at 01/24/2021 0600 Gross per 24 hour  Intake --  Output 300 ml  Net -300 ml   Last 3 Weights 01/23/2021 01/18/2021 01/09/2021  Weight (lbs) 170 lb 3.1 oz 170 lb 10.2 oz 170 lb 10.2 oz  Weight (kg) 77.2 kg 77.4 kg 77.4 kg      Telemetry  Overnight telemetry shows sinus rhythm in the 70s, which I personally reviewed.   Physical Exam   Vitals:   01/23/21 1746 01/23/21 1800 01/23/21 2105 01/24/21 0453  BP: (!) 126/47  (!) 130/48 (!) 120/42  Pulse: 67 69 70 (!) 58  Resp: (!) 26 18  (!) 25 (!) 25  Temp: 98.3 F (36.8 C)  99.3 F (37.4 C) 99.3 F (37.4 C)  TempSrc: Oral  Oral Oral  SpO2: 96% 97% 97% 97%  Weight:      Height:        Intake/Output Summary (Last 24 hours) at 01/24/2021 1159 Last data filed at 01/24/2021 0600 Gross per 24 hour  Intake --  Output 300 ml  Net -300 ml    Last 3 Weights 01/23/2021 01/18/2021 01/09/2021  Weight (lbs) 170 lb 3.1 oz 170 lb 10.2 oz 170 lb 10.2 oz  Weight (kg) 77.2 kg 77.4 kg 77.4 kg    Body mass index is 31.13 kg/m.   General: Ill-appearing Head: Atraumatic, normal size  Eyes: PEERLA, EOMI  Neck: Supple, no JVD Endocrine: No thryomegaly Cardiac: Normal S1, S2; RRR; no murmurs, rubs, or gallops Lungs: Clear to auscultation bilaterally, no wheezing, rhonchi or rales  Abd: Soft, nontender, no hepatomegaly  Ext: No edema, pulses 2+ Musculoskeletal: No deformities, BUE and BLE strength normal and equal Skin: Warm and dry, no rashes   Neuro: Alert and oriented to person, place, time, and  situation, CNII-XII grossly intact, no focal deficits  Psych: Normal mood and affect   Labs  High Sensitivity Troponin:  No results for input(s): TROPONINIHS in the last 720 hours.   Cardiac EnzymesNo results for input(s): TROPONINI in the last 168 hours. No results for input(s): TROPIPOC in the last 168 hours.  Chemistry Recent Labs  Lab 01/22/21 0515 01/23/21 0638 01/24/21 0205  NA 144 145 146*  K 3.8 3.6 3.6  CL 118* 120* 120*  CO2 22 21* 23  GLUCOSE 130* 120* 131*  BUN 56* 52* 50*  CREATININE 1.16* 1.24* 1.23*  CALCIUM 8.9 8.9 8.9  PROT 4.6* 4.7* 4.6*  ALBUMIN 1.5* 1.5* 1.4*  AST 91* 80* 62*  ALT 102* 95* 80*  ALKPHOS 106 118 111  BILITOT 0.6 1.1 0.8  GFRNONAA 47* 43* 44*  ANIONGAP 4* 4* 3*    Hematology Recent Labs  Lab 01/22/21 0515 01/23/21 0638 01/23/21 1519 01/24/21 0205  WBC 5.7 6.1  --  6.4  RBC 2.40* 2.87*  --  2.66*  HGB 7.2* 8.9* 8.1* 8.1*  HCT 24.3* 28.4* 25.9* 26.4*  MCV 101.3* 99.0  --  99.2   MCH 30.0 31.0  --  30.5  MCHC 29.6* 31.3  --  30.7  RDW 16.8* 16.9*  --  16.8*  PLT 95* 94*  --  89*   BNPNo results for input(s): BNP, PROBNP in the last 168 hours.  DDimer No results for input(s): DDIMER in the last 168 hours.   Radiology  US Abdomen Complete  Result Date: 01/23/2021 CLINICAL DATA:  History of cirrhosis EXAM: ABDOMEN ULTRASOUND COMPLETE COMPARISON:  01/18/2021 FINDINGS: Gallbladder: Surgically removed Common bile duct: Diameter: 5.5 mm Liver: Minimal nodularity of the liver is noted without focal mass. These changes are consistent with the given clinical history of cirrhosis. Portal vein is patent on color Doppler imaging with normal direction of blood flow towards the liver. IVC: No abnormality visualized. Pancreas: Visualized portion unremarkable. Spleen: Prominent at 14.8 cm. Right Kidney: Length: 11.9 cm. Echogenicity within normal limits. No mass or hydronephrosis visualized. 2.9 cm cyst is noted in the mid to upper pole. Previously seen hydronephrotic changes have resolved likely related to bladder decompression. Left Kidney: Length: 10.6 cm. Mild cortical thinning is noted. No mass lesion is noted. Hydronephrosis has resolved related to bladder decompression. Abdominal aorta: No aneurysm visualized. Other findings: None. IMPRESSION: Mild changes of cirrhosis. Right renal cysts stable from prior CT. Resolution of previously seen bilateral hydronephrosis following bladder decompression. Electronically Signed   By: Inez Catalina M.D.   On: 01/23/2021 08:20   ECHOCARDIOGRAM COMPLETE  Result Date: 01/23/2021    ECHOCARDIOGRAM REPORT   Patient Name:   Carrie Monroe Date of Exam: 01/23/2021 Medical Rec #:  [id number]           Height:       62.0 in Accession #:    [id number]          Weight:       170.2 lb Date of Birth:  1935-06-21           BSA:          1.785 m Patient Age:    85 years            BP:           120/41 mmHg Patient Gender: F                   HR:  66  bpm. Exam Location:  Inpatient Procedure: 2D Echo, Cardiac Doppler and Color Doppler Indications:    CHF  History:        Patient has prior history of Echocardiogram examinations, most                 recent 11/22/2019. 60m medtronic valve in the aortic position,                 Arrythmias:Atrial Fibrillation; Risk Factors:Hypertension,                 Diabetes and Dyslipidemia. H/O bacteremia.  Sonographer:    RMerrie RoofRDCS Referring Phys: [id number] Play 1. Left ventricular ejection fraction, by estimation, is 45 to 50%. The left ventricle has mildly decreased function. The left ventricle demonstrates global hypokinesis with septal-lateral dyssynchrony due to LBBB. There is moderate left ventricular hypertrophy. Left ventricular diastolic parameters are consistent with Grade I diastolic dysfunction (impaired relaxation).  2. Right ventricular systolic function is normal. The right ventricular size is normal. Tricuspid regurgitation signal is inadequate for assessing PA pressure.  3. Left atrial size was moderately dilated.  4. Right atrial size was moderately dilated.  5. There is a large 1.45 x 1.45 cm mobile, well-circumscribed mass present on the atrial surface at the base of the posterior mitral valve leaflet. This certainly looks like it could be a myxoma but was was not present on 6/21 echo. Cannot rule out vegetation/endocarditis. The mitral valve is degenerative. Mild to moderate mitral valve regurgitation. No evidence of mitral stenosis. Moderate mitral annular calcification.  6. There is a 26 mm Medtronic CoreValve-EvolutR prosthetic (TAVR) valve present in the aortic position. Mean gradient mildly elevated at 17 mmHg. No perivalvular leakage noted. The aortic valve has been repaired/replaced. Aortic valve regurgitation is not visualized.  7. The inferior vena cava is normal in size with greater than 50% respiratory variability, suggesting right atrial pressure of 3 mmHg.  FINDINGS  Left Ventricle: Left ventricular ejection fraction, by estimation, is 45 to 50%. The left ventricle has mildly decreased function. The left ventricle demonstrates global hypokinesis. The left ventricular internal cavity size was normal in size. There is  moderate left ventricular hypertrophy. Left ventricular diastolic parameters are consistent with Grade I diastolic dysfunction (impaired relaxation). Right Ventricle: The right ventricular size is normal. No increase in right ventricular wall thickness. Right ventricular systolic function is normal. Tricuspid regurgitation signal is inadequate for assessing PA pressure. Left Atrium: Left atrial size was moderately dilated. Right Atrium: Right atrial size was moderately dilated. Pericardium: There is no evidence of pericardial effusion. Mitral Valve: There is a large 1.45 x 1.45 cm mobile, well-circumscribed mass present on the atrial surface at the base of the posterior mitral valve leaflet. This certainly looks like it could be a myxoma but was was not present on 6/21 echo. Cannot rule out vegetation/endocarditis. The mitral valve is degenerative in appearance. There is moderate calcification of the mitral valve leaflet(s). Moderate mitral annular calcification. Mild to moderate mitral valve regurgitation. No evidence of mitral valve stenosis. Tricuspid Valve: The tricuspid valve is normal in structure. Tricuspid valve regurgitation is not demonstrated. Aortic Valve: There is a 26 mm Medtronic CoreValve-EvolutR prosthetic (TAVR) valve present in the aortic position. Mean gradient mildly elevated at 17 mmHg. No perivalvular leakage noted. The aortic valve has been repaired/replaced. Aortic valve regurgitation is not visualized. Aortic valve mean gradient measures 17.0 mmHg. Aortic valve peak gradient measures 32.9 mmHg. Aortic  valve area, by VTI measures 0.98 cm. There is a 26 mm Medtronic-Hall stented (TAVR) valve present in the aortic position. Pulmonic  Valve: The pulmonic valve was normal in structure. Pulmonic valve regurgitation is trivial. Aorta: The aortic root is normal in size and structure. Venous: The inferior vena cava is normal in size with greater than 50% respiratory variability, suggesting right atrial pressure of 3 mmHg. IAS/Shunts: No atrial level shunt detected by color flow Doppler.  LEFT VENTRICLE PLAX 2D LVIDd:         4.70 cm      Diastology LVIDs:         3.40 cm      LV e' medial:  6.20 cm/s LV PW:         1.40 cm      LV e' lateral: 5.11 cm/s LV IVS:        1.40 cm LVOT diam:     1.70 cm LV SV:         56 LV SV Index:   31 LVOT Area:     2.27 cm  LV Volumes (MOD) LV vol d, MOD A4C: 123.0 ml LV vol s, MOD A4C: 41.0 ml LV SV MOD A4C:     123.0 ml RIGHT VENTRICLE RV Basal diam:  4.70 cm RV Mid diam:    3.70 cm LEFT ATRIUM              Index       RIGHT ATRIUM           Index LA diam:        3.80 cm  2.13 cm/m  RA Area:     31.10 cm LA Vol (A2C):   108.0 ml 60.51 ml/m RA Volume:   105.00 ml 58.82 ml/m LA Vol (A4C):   56.6 ml  31.71 ml/m LA Biplane Vol: 80.7 ml  45.21 ml/m  AORTIC VALVE AV Area (Vmax):    0.96 cm AV Area (Vmean):   0.93 cm AV Area (VTI):     0.98 cm AV Vmax:           287.00 cm/s AV Vmean:          189.000 cm/s AV VTI:            0.566 m AV Peak Grad:      32.9 mmHg AV Mean Grad:      17.0 mmHg LVOT Vmax:         122.00 cm/s LVOT Vmean:        77.600 cm/s LVOT VTI:          0.245 m LVOT/AV VTI ratio: 0.43  AORTA Ao Root diam: 3.10 cm MR Peak grad:    105.9 mmHg MR Vmax:         514.50 cm/s SHUNTS MR PISA:         1.01 cm    Systemic VTI:  0.24 m MR PISA Eff ROA: 6 mm       Systemic Diam: 1.70 cm MR PISA Radius:  0.40 cm Dalton McleanMD Electronically signed by Franki Monte Signature Date/Time: 01/23/2021/5:22:40 PM    Final     Cardiac Studies  TTE 01/23/2021  1. Left ventricular ejection fraction, by estimation, is 45 to 50%. The  left ventricle has mildly decreased function. The left ventricle  demonstrates  global hypokinesis with septal-lateral dyssynchrony due to  LBBB. There is moderate left ventricular  hypertrophy. Left ventricular diastolic parameters are consistent with  Grade I  diastolic dysfunction (impaired relaxation).   2. Right ventricular systolic function is normal. The right ventricular  size is normal. Tricuspid regurgitation signal is inadequate for assessing  PA pressure.   3. Left atrial size was moderately dilated.   4. Right atrial size was moderately dilated.   5. There is a large 1.45 x 1.45 cm mobile, well-circumscribed mass  present on the atrial surface at the base of the posterior mitral valve  leaflet. This certainly looks like it could be a myxoma but was was not  present on 6/21 echo. Cannot rule out  vegetation/endocarditis. The mitral valve is degenerative. Mild to  moderate mitral valve regurgitation. No evidence of mitral stenosis.  Moderate mitral annular calcification.   6. There is a 26 mm Medtronic CoreValve-EvolutR prosthetic (TAVR) valve  present in the aortic position. Mean gradient mildly elevated at 17 mmHg.  No perivalvular leakage noted. The aortic valve has been  repaired/replaced. Aortic valve regurgitation is  not visualized.   7. The inferior vena cava is normal in size with greater than 50%  respiratory variability, suggesting right atrial pressure of 3 mmHg.   Patient Profile  Carrie Monroe is a 85 y.o. female with CAD status post drug-eluting stent to the LAD and RCA in 2018, aortic stenosis status post TAVR, bilateral carotid artery disease status post CEA in 2020, persistent atrial fibrillation, systolic heart failure (EF 40-45%), left bundle branch block who was admitted on 01/18/2021 with altered mental status and obstructive uropathy.  Foley has been placed.  Cardiology was consulted for rhythm management in the setting of cirrhosis found on imaging.  Assessment & Plan   Mitral valve endocarditis/status post TAVR -She is had a  slow decline over the past few weeks.  Recently admitted with elevated ESR and CRP.  We did obtain a repeat echocardiogram which now shows a large mitral valve vegetation.  Blood cultures are positive for enterococcus  -She will need infectious disease consult. -She will ultimately need a transesophageal echocardiogram however I am unsure if this is necessary.  I will reach out to family discussed with him.  It is unclear that she would be a surgical candidate.  She is quite debilitated and very weak.  She has multiple comorbidities that may preclude her from cardiac surgery.  A transesophageal echocardiogram may be futile.  We will discuss this with family. -For now we will continue with supportive care with antibiotics.  2.  Persistent atrial fibrillation -Continue Eliquis 5 mg twice daily. -Does have mild changes of cirrhosis and LFTs are minimally elevated. -Okay to continue Eliquis for now. -Limited options for rhythm control.  Would just continue amiodarone for now.  3.  Systolic heart failure, EF 40 to 45% -Euvolemic on review.  Collapsing IVC. -It has been recommended to avoid AV nodal agents.  It appears pacing has been discussed in the past.  She is not a candidate for pacing in setting of endocarditis. -Would be cautious with ACE/ARB/Arni/MRA given CKD. -For now we will discontinue hydralazine and Imdur strategy.  4. CAD s/p PCI -Not on statin at home.  Would hold statin she is on amiodarone with mild cirrhosis changes. -We will continue Zetia for lipid lowering agent.  For questions or updates, please contact [place] Please consult [email or web address] for contact info under   Time Spent with Patient: I have spent a total of 35 minutes with patient reviewing hospital notes, telemetry, EKGs, labs and examining the patient as well as establishing an assessment and  plan that was discussed with the patient.  > 50% of time was spent in direct patient care.    Signed, Addison Naegeli.  Audie Box, MD, [place]  01/24/2021 11:59 AM

## 2021-01-24 NOTE — Progress Notes (Addendum)
Initial Nutrition Assessment  DOCUMENTATION CODES:   Not applicable  INTERVENTION:   Ensure Enlive po TID, each supplement provides 350 kcal and 20 grams of protein Magic cup TID with meals, each supplement provides 290 kcal and 9 grams of protein MVI with minerals daily  NUTRITION DIAGNOSIS:   Increased nutrient needs related to wound healing as evidenced by estimated needs.  GOAL:   Patient will meet greater than or equal to 90% of their needs  MONITOR:   PO intake, Supplement acceptance, Diet advancement, Labs, Skin  REASON FOR ASSESSMENT:   Consult Poor PO  ASSESSMENT:   85 yo female admitted with AKI on CKD stage 3a. PMH includes CHF, CAD, A fib, tachybrady syndrome, DM, CKD, anemia, HTN.  In the ED, CT abd/pelvis showed bilateral hydronephrosis, hydroureter, hepatic cirrhosis, splenomegaly.  S/P bedside swallow evaluation with SLP this morning. Previously on dysphagia 1-thin liquids (meal intakes 5-20% since admission). Diet was changed to heart healthy this morning and patient received regular foods. Patient is very weak so diet was changed  back to dysphagia 1 with thin liquids. Discussed with SLP patient would benefit from PO supplements.  Labs reviewed. Na 146 CBG: 132-125-116  Medications reviewed and include ferrous sulfate, Flomax, vitamin B-12 tablet, IV antibiotics.  Weight history reviewed. Patient has had 7.5% weight loss within the past 3 months, mostly over the past 2 weeks.  Nutrition focused physical exam completed 2 weeks ago showed mild orbital depletion in one area and mild muscle depletion in four areas. Suspect mild muscle depletion is still present, maybe worse, given recent weight loss and poor intake.   Diet Order:   Diet Order             DIET - DYS 1 Room service appropriate? Yes with Assist; Fluid consistency: Thin  Diet effective now                   EDUCATION NEEDS:   Not appropriate for education at this time  Skin:   Skin Integrity Issues:: Unstageable Unstageable: coccyx  Last BM:  8/27  Height:   Ht Readings from Last 1 Encounters:  01/23/21 5\' 2"  (1.575 m)    Weight:   Wt Readings from Last 1 Encounters:  01/23/21 77.2 kg    BMI:  Body mass index is 31.13 kg/m.  Estimated Nutritional Needs:   Kcal:  1600-1800   Protein:  100-120 gm  Fluid:  >/= 1.6 L    Lucas Mallow, RD, LDN, CNSC Please refer to Amion for contact information.

## 2021-01-24 NOTE — Progress Notes (Signed)
Physical Therapy Treatment Patient Details Name: Carrie Monroe MRN: [id number] DOB: 01-11-1936 Today's Date: 01/24/2021    History of Present Illness 85 y.o. female presents to [facility] from SNF on 01/18/2021 with weakness and pseudogout of knee along with elevated creatinine. CT abdomen/pelvis demonstrates bladder distention with bilateral hydronephrosis and hydroureter, changes of hepatic cirrhosis with prominent splenic vein varices and splenomegaly. Pt found to have UTI.    PT Comments    Pt supine in bed with mouth gaped open and eyes open on entry. Nods when asked if she is feeling tired this morning. Does not protest to suggestion to get up to chair. Pt is maxAx2 to come to EoB. Once there requires modA for steadying in seated, able to progress to min guard for seated balance for approximately 30 sec x2. Pt requires total A for transfer to recliner. D/c plans remain appropriate at this time. PT will continue to follow acutely.    Follow Up Recommendations  SNF     Equipment Recommendations  Wheelchair (measurements PT);Wheelchair cushion (measurements PT);3in1 (PT);Hospital bed (hoyer lift)       Precautions / Restrictions Precautions Precautions: Fall;Other (comment) Precaution Comments: painful sore on buttocks Restrictions Weight Bearing Restrictions: No    Mobility  Bed Mobility Overal bed mobility: Needs Assistance Bed Mobility: Supine to Sit;Rolling Rolling: Total assist   Supine to sit: Max assist;+2 for physical assistance;HOB elevated     General bed mobility comments: rolled L and R for placement of pad to assist in transfer, maxAx2 for coming to upright, minimal core activation to assist in bringing trunk to upright    Transfers Overall transfer level: Needs assistance Equipment used: 2 person hand held assist Transfers: Lateral/Scoot Transfers          Lateral/Scoot Transfers: Total assist General transfer comment: requires 2 scoots with  total A to reach chair  Ambulation/Gait             General Gait Details: unable          Balance Overall balance assessment: Needs assistance Sitting-balance support: Bilateral upper extremity supported;Feet supported Sitting balance-Leahy Scale: Poor Sitting balance - Comments: minG-modA, once LE arranged for maximal support pt able to maintain seated balance with not outside support for approx 30 sec x 2   Standing balance support: Bilateral upper extremity supported Standing balance-Leahy Scale: Zero Standing balance comment: max-totalA                            Cognition Arousal/Alertness: Lethargic Behavior During Therapy: Flat affect Overall Cognitive Status: Impaired/Different from baseline Area of Impairment: Attention;Memory;Following commands;Safety/judgement;Awareness;Problem solving                   Current Attention Level: Sustained Memory: Decreased short-term memory Following Commands: Follows one step commands with increased time Safety/Judgement: Decreased awareness of safety;Decreased awareness of deficits Awareness: Intellectual Problem Solving: Slow processing;Decreased initiation;Requires verbal cues;Requires tactile cues General Comments: pt with limited response mainly with eyes and groans, weakness factor in her ability to follow commands         General Comments General comments (skin integrity, edema, etc.): VSS on RA, RN notifed increased R UE edema causing hospital bands to be tight and has some minor bleeding from 2 IV sites in R UE, attempted to have pt wash her face in seated, but lacks strength in hand to grasp washcloth      Pertinent Vitals/Pain Pain Assessment: Faces Faces  Pain Scale: Hurts even more Pain Location: generalized Pain Descriptors / Indicators: Grimacing;Moaning;Discomfort Pain Intervention(s): Limited activity within patient's tolerance;Monitored during session;Repositioned     PT Goals  (current goals can now be found in the care plan section) Acute Rehab PT Goals Patient Stated Goal: to improve mobility quality PT Goal Formulation: With family Time For Goal Achievement: 02/04/21 Potential to Achieve Goals: Fair Progress towards PT goals: Progressing toward goals (slowed)    Frequency    Min 2X/week      PT Plan Current plan remains appropriate       AM-PAC PT "6 Clicks" Mobility   Outcome Measure  Help needed turning from your back to your side while in a flat bed without using bedrails?: Total Help needed moving from lying on your back to sitting on the side of a flat bed without using bedrails?: Total Help needed moving to and from a bed to a chair (including a wheelchair)?: Total Help needed standing up from a chair using your arms (e.g., wheelchair or bedside chair)?: Total Help needed to walk in hospital room?: Total Help needed climbing 3-5 steps with a railing? : Total 6 Click Score: 6    End of Session Equipment Utilized During Treatment: Gait belt Activity Tolerance: Patient limited by fatigue Patient left: in chair;with call bell/phone within reach;with family/visitor present Nurse Communication: Mobility status;Need for lift equipment (requires maximove back to bed) PT Visit Diagnosis: Other abnormalities of gait and mobility (R26.89);Muscle weakness (generalized) (M62.81);Pain Pain - part of body:  (buttocks)     Time: 1610-9604 PT Time Calculation (min) (ACUTE ONLY): 23 min  Charges:  $Therapeutic Activity: 23-37 mins                     Ethyle B. Migdalia Dk PT, DPT Acute Rehabilitation Services Pager [phone] Office [phone]    [place] 01/24/2021, 9:11 AM

## 2021-01-24 NOTE — Consult Note (Signed)
Consultation Note Date: 01/24/2021   Patient Name: Carrie Monroe  DOB: 30-Jun-1935  MRN: [id number]  Age / Sex: 85 y.o., female  PCP: Carrie Monroe., MD Referring Physician: Darliss Cheney, MD  Reason for Consultation: Establishing goals of care  HPI/Patient Profile: 85 y.o. female  with past medical history of CHF, CAD status post stenting, A. fib tachybradycardia syndrome, diabetes mellitus, chronic kidney disease and anemia admitted on 01/18/2021 with elevated creatinine from [facility].  Patient was recently admitted for CHF exacerbation after several weeks of worsening symptoms. CT now shows prominent distention of the bladder with bilateral hydronephrosis and hydroureter, changes of hepatic cirrhosis with prominent splenic vein varices and splenomegaly.   Patient also has Enterococcus bacteremia/bacterial endocarditis and multiple comorbidities. Palliative medicine has been consulted to assist with goals of care conversation.   Clinical Assessment and Goals of Care:  I have reviewed medical records including EPIC notes, labs and imaging, assessed the patient and then called patient's daughter-in-law Carrie Monroe to discuss diagnosis prognosis, GOC, EOL wishes, disposition and options.  I introduced Palliative Medicine as specialized medical care for people living with serious illness. It focuses on providing relief from the symptoms and stress of a serious illness. The goal is to improve quality of life for both the patient and the family.  We discussed a brief life review of the patient and then focused on their current illness. The natural disease trajectory and expectations at EOL were discussed. Carrie Monroe was married for over 84 years and has 4 adult children (2 sons, 2 daughters). 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 She is a member of  the [employer] in [place]. Patient's daughter-in-law Carrie Monroe (son Carrie Monroe's spouse) shares that she is a former Therapist, sports at a SNF and she has a good understanding of Carrie Monroe's current illness. Discussed her poor oral intake, bloodstream infection, endocarditis, and lack of surgical option given patient's comorbidities and ongoing functional decline. Carrie Monroe voices her appreciate for candid updates from patient's providers and tells me that daughter Carrie Monroe is both financial and healthcare POA. The family has remained in contact with each other to determine the best plan moving forward considering both patient's preferences and new information. Carrie Monroe understands that patient is at high risk to decline and it will become clear within a couple of days whether she is responding to antibiotics. Discussed risk of worsening kidney injury.   I attempted to elicit values and goals of care important to the patient.   Carrie Monroe confirms that patient has always been clear on her wish for DNR order. Patient has also clearly stated that she would never want to be dependent on any artificial life support, including feeding tubes. Most (3/4) of patient's children understands that Carrie Monroe would be at peace with her mortality if she does not improve with medical management over the next few days.  The difference between aggressive medical intervention and comfort care was considered in light  of the patient's goals of care.   Advanced directives, concepts specific to code status, artifical feeding and hydration, and rehospitalization were considered and discussed.   Discussed the importance of continued conversation with family and the medical providers regarding overall plan of care and treatment options, ensuring decisions are within the context of the patient's values and GOCs.    Questions and concerns were addressed. The family was encouraged to call with questions or concerns.   PMT will continue to support holistically.   HCPOA is patient's youngest daughter Carrie Monroe, who lives in [place]. Not on file.    SUMMARY OF RECOMMENDATIONS   -DNR confirmed -Continue current interventions -Patient's DIL shares that Carrie Monroe would never want artificial life support, feeding tubes etc, and would prefer a natural death if she does not improve -Left a voicemail for patient's daughter/HCPOA Carrie Monroe -Ongoing support from PMT  Code Status/Advance Care Planning: DNR  Palliative Prophylaxis:  Aspiration and Delirium Protocol  Additional Recommendations (Limitations, Scope, Preferences): No Artificial Feeding, No Hemodialysis, and No Surgical Procedures  Psycho-social/Spiritual:  Desire for further Chaplaincy support:tbd Additional Recommendations: Education on Hospice and Referral to [employer]   Prognosis:  Unable to determine  Discharge Planning: To Be Determined      Primary Diagnoses: Present on Admission:  ARF (acute renal failure) ([place])  Essential hypertension  CAD- severe 3V CAD   Three-vessel CAD-PCI to LAD and LCx, med management of RCA  Persistent atrial fibrillation (HCC)  Acute encephalopathy  AKI (acute kidney injury) ([place])   I have reviewed the medical record, interviewed the patient and family, and examined the patient. The following aspects are pertinent.  Past Medical History:  Diagnosis Date   Aortic stenosis, severe    a. 01/2017: s/p TAVR; hospital course complicated by large groin hematoma/wound   Arthritis    "fingers" (11/06/2017)   Cancer of left breast ([place]) 2009   s/p lumpectomy and XRT   Carotid artery stenosis    a. s/p R TCAR 02/2019 followed by [facility].   CKD (chronic kidney disease)    Coronary artery disease    a. 11/2016: diagnosed with multivessel CAD, turned down for CABG and underwent PCI/DES to mLCx, PCI/DES to mLAD and PCTA of ostial diagonal on 12/28/16   Diabetes mellitus type 2, diet-controlled ([place])     Exogenous obesity    Gout    "on daily RX" (11/06/2017)   Heart murmur    Hemorrhoids    HFmrEF (heart failure with mid-range ejection fraction) ([place])    a. 10/2019 Echo: EF 40-45%, glo HK. RVSP 20mmHg. Mildly reduced RV fxn. Sev BAE. Mild to mod MR. Nl fxn AoV prosthesis.   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   "scalp"   Thrombocytopenia ([place])    Social History   Socioeconomic History   Marital status: Widowed    Spouse name: Not on file   Number of children: 4   Years of education: Not on file   Highest education level: Some college, no degree  Occupational History   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  Tobacco Use   Smoking  status: Never   Smokeless tobacco: Never  Vaping Use   Vaping Use: Never used  Substance and Sexual Activity   Alcohol use: Never   Drug use: Never   Sexual activity: Not Currently  Other Topics Concern   Not on file  Social History Narrative   Epworth Sleepiness scale score =11 as of 01/25/16   No caffeine   06/24/19 lives alone   Social Determinants of Health   Financial Resource Strain: Not on file  Food Insecurity: Not on file  Transportation Needs: Not on file  Physical Activity: Not on file  Stress: Not on file  Social Connections: Not on file   Family History  Problem Relation Age of Onset   Cancer Mother        pancreatic   Heart disease Father    Hypertension Father    Other Father        dialysis   Cancer Brother    Diabetes Sister    Sudden death Brother        age 49   Scheduled Meds:  sodium chloride   Intravenous Once   amiodarone  200 mg  Oral Daily   apixaban  2.5 mg Oral BID   Chlorhexidine Gluconate Cloth  6 each Topical Daily   ezetimibe  10 mg Oral Daily   feeding supplement  237 mL Oral TID BM   ferrous sulfate  325 mg Oral Daily   hydrALAZINE  10 mg Oral Q8H   isosorbide mononitrate  15 mg Oral Daily   multivitamin with minerals  1 tablet Oral Daily   tamsulosin  0.4 mg Oral QPC breakfast   vitamin B-12  500 mcg Oral Daily   Continuous Infusions:  ampicillin (OMNIPEN) IV 2 g (01/24/21 1231)   cefTRIAXone (ROCEPHIN)  IV 2 g (01/24/21 1227)   PRN Meds:. Medications Prior to Admission:  Prior to Admission medications   Medication Sig Start Date End Date Taking? Authorizing Provider  acetaminophen (TYLENOL) 325 MG tablet Take 2 tablets (650 mg total) by mouth every 6 (six) hours as needed for mild pain or moderate pain (or Fever >/= 101). 01/09/21  Yes Arrien, Jimmy Picket, MD  amiodarone (PACERONE) 200 MG tablet Take 1 tablet (200 mg total) by mouth daily. 12/01/20  Yes Croitoru, Mihai, MD  apixaban (ELIQUIS) 2.5 MG TABS tablet Take 1 tablet (2.5 mg total) by mouth 2 (two) times daily. 12/01/20  Yes Croitoru, Mihai, MD  Ascorbic Acid (VITAMIN C) 1000 MG tablet Take 1,000 mg by mouth daily.   Yes [provider]  Cholecalciferol (VITAMIN D) 50 MCG (2000 UT) tablet Take 2,000 Units by mouth daily.   Yes [provider]  colchicine 0.6 MG tablet Take 0.5 tablets (0.3 mg total) by mouth daily. Patient taking differently: Take 0.3 mg by mouth daily as needed (gout flare-ups). 01/10/21 02/09/21 Yes Arrien, Jimmy Picket, MD  COLLAGEN PO Take 5 g by mouth daily. power   Yes [provider]  Cyanocobalamin (VITAMIN B 12 PO) Take 1 Dose by mouth daily. liquid   Yes [provider]  diclofenac Sodium (VOLTAREN) 1 % GEL Apply 2 g topically 4 (four) times daily. Tender joints 01/09/21  Yes Arrien, Jimmy Picket, MD  feeding supplement (ENSURE ENLIVE / ENSURE PLUS) LIQD Take 237 mLs by mouth 2  (two) times daily between meals. 01/10/21 02/09/21 Yes Arrien, Jimmy Picket, MD  ferrous sulfate 325 (65 FE) MG EC tablet Take 325 mg by mouth daily.   Yes  [provider]  furosemide (LASIX) 40 MG tablet Take 40 mg by mouth daily.   Yes [provider]  Multiple Vitamin (MULTIVITAMIN WITH MINERALS) TABS tablet Take 1 tablet by mouth daily. 01/10/21 02/09/21 Yes Arrien, Jimmy Picket, MD  rosuvastatin (CRESTOR) 10 MG tablet Take 10 mg by mouth daily. 02/09/20  Yes [provider]  traMADol (ULTRAM) 50 MG tablet Take 0.5 tablets (25 mg total) by mouth every 6 (six) hours as needed for moderate pain or severe pain. 01/09/21  Yes Arrien, Jimmy Picket, MD   Allergies  Allergen Reactions   Oxycodone Other (See Comments)    Hallunication   Review of Systems  Unable to perform ROS: Mental status change   Physical Exam Vitals and nursing note reviewed.  Constitutional:      General: She is not in acute distress.    Appearance: She is ill-appearing.     Comments: Sitting up in bedside chair.  Cardiovascular:     Rate and Rhythm: Normal rate.  Pulmonary:     Effort: Pulmonary effort is normal.  Neurological:     Mental Status: She is alert. She is confused.    Vital Signs: BP (!) 111/49 (BP Location: Right Arm)   Pulse 81   Temp 99.1 F (37.3 C) (Oral)   Resp 19   Ht 5\' 2"  (1.575 m)   Wt 77.2 kg   SpO2 97%   BMI 31.13 kg/m  Pain Scale: 0-10   Pain Score: 0-No pain   SpO2: SpO2: 97 % O2 Device:SpO2: 97 % O2 Flow Rate: .   IO: Intake/output summary:  Intake/Output Summary (Last 24 hours) at 01/24/2021 1456 Last data filed at 01/24/2021 1447 Gross per 24 hour  Intake --  Output 1050 ml  Net -1050 ml    LBM: Last BM Date: 01/21/21 Baseline Weight: Weight: 77.4 kg Most recent weight: Weight: 77.2 kg     Palliative Assessment/Data: 40%     Time In: 3:00pm  Time Out: 3:50pm Time Total: 50 minutes Greater than 50% of this time was spent in  counseling and coordinating care related to the above assessment and plan.  Carrie Cooler, PA-C Palliative Medicine Team Team phone # [phone]  Thank you for allowing the Palliative Medicine Team to assist in the care of this patient. Please utilize secure chat with additional questions, if there is no response within 30 minutes please call the above phone number.  Palliative Medicine Team providers are available by phone from 7am to 7pm daily and can be reached through the team cell phone.  Should this patient require assistance outside of these hours, please call the patient's attending physician.
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
## 2021-01-25 DIAGNOSIS — I330 Acute and subacute infective endocarditis: Secondary | ICD-10-CM | POA: Diagnosis not present

## 2021-01-25 DIAGNOSIS — N179 Acute kidney failure, unspecified: Secondary | ICD-10-CM | POA: Diagnosis not present

## 2021-01-25 LAB — COMPREHENSIVE METABOLIC PANEL
ALT: 79 U/L — ABNORMAL HIGH (ref 0–44)
AST: 60 U/L — ABNORMAL HIGH (ref 15–41)
Albumin: 1.5 g/dL — ABNORMAL LOW (ref 3.5–5.0)
Alkaline Phosphatase: 136 U/L — ABNORMAL HIGH (ref 38–126)
Anion gap: 4 — ABNORMAL LOW (ref 5–15)
BUN: 50 mg/dL — ABNORMAL HIGH (ref 8–23)
CO2: 23 mmol/L (ref 22–32)
Calcium: 9.1 mg/dL (ref 8.9–10.3)
Chloride: 122 mmol/L — ABNORMAL HIGH (ref 98–111)
Creatinine, Ser: 1.31 mg/dL — ABNORMAL HIGH (ref 0.44–1.00)
GFR, Estimated: 41 mL/min — ABNORMAL LOW (ref >60)
Glucose, Bld: 165 mg/dL — ABNORMAL HIGH (ref 70–99)
Potassium: 3.8 mmol/L (ref 3.5–5.1)
Sodium: 149 mmol/L — ABNORMAL HIGH (ref 135–145)
Total Bilirubin: 0.4 mg/dL (ref 0.3–1.2)
Total Protein: 4.9 g/dL — ABNORMAL LOW (ref 6.5–8.1)

## 2021-01-25 LAB — CBC WITH DIFFERENTIAL/PLATELET
Abs Immature Granulocytes: 0.04 10*3/uL (ref 0.00–0.07)
Basophils Absolute: 0 10*3/uL (ref 0.0–0.1)
Basophils Relative: 0 %
Eosinophils Absolute: 0.1 10*3/uL (ref 0.0–0.5)
Eosinophils Relative: 1 %
HCT: 27.9 % — ABNORMAL LOW (ref 36.0–46.0)
Hemoglobin: 8.4 g/dL — ABNORMAL LOW (ref 12.0–15.0)
Immature Granulocytes: 1 %
Lymphocytes Relative: 10 %
Lymphs Abs: 0.7 10*3/uL (ref 0.7–4.0)
MCH: 30.5 pg (ref 26.0–34.0)
MCHC: 30.1 g/dL (ref 30.0–36.0)
MCV: 101.5 fL — ABNORMAL HIGH (ref 80.0–100.0)
Monocytes Absolute: 0.4 10*3/uL (ref 0.1–1.0)
Monocytes Relative: 6 %
Neutro Abs: 5.7 10*3/uL (ref 1.7–7.7)
Neutrophils Relative %: 82 %
Platelets: 87 10*3/uL — ABNORMAL LOW (ref 150–400)
RBC: 2.75 MIL/uL — ABNORMAL LOW (ref 3.87–5.11)
RDW: 17 % — ABNORMAL HIGH (ref 11.5–15.5)
WBC: 6.9 10*3/uL (ref 4.0–10.5)
nRBC: 0 % (ref 0.0–0.2)

## 2021-01-25 LAB — GLUCOSE, CAPILLARY
Glucose-Capillary: 140 mg/dL — ABNORMAL HIGH (ref 70–99)
Glucose-Capillary: 133 mg/dL — ABNORMAL HIGH (ref 70–99)
Glucose-Capillary: 200 mg/dL — ABNORMAL HIGH (ref 70–99)
Glucose-Capillary: 197 mg/dL — ABNORMAL HIGH (ref 70–99)
Glucose-Capillary: 162 mg/dL — ABNORMAL HIGH (ref 70–99)

## 2021-01-25 MED ORDER — APIXABAN 2.5 MG PO TABS
2.5000 mg | ORAL_TABLET | Freq: Once | ORAL | Status: AC
  Administered 2021-01-25: 2.5 mg via ORAL
  Filled 2021-01-25: qty 1

## 2021-01-25 MED ORDER — APIXABAN 5 MG PO TABS
5.0000 mg | ORAL_TABLET | Freq: Two times a day (BID) | ORAL | Status: DC
  Administered 2021-01-25 – 2021-01-31 (×12): 5 mg via ORAL
  Filled 2021-01-25 (×12): qty 1

## 2021-01-25 NOTE — Progress Notes (Signed)
Patient's daughter Jackelyn Poling is visiting at the bedside. Jackelyn Poling asks that patient's other daughter [name] should be the primary point of contact for the patient and that the patient's son [name] should be the secondary contact for the patient.

## 2021-01-25 NOTE — Progress Notes (Addendum)
PROGRESS NOTE    Carrie Monroe  STM:[id number] DOB: 05-03-1936 DOA: 01/18/2021 PCP: Ginger Organ., MD    Brief Narrative:  85 year old female with history of congestive heart failure, coronary artery disease status post stenting, atrial fibrillation tachybradycardia syndrome, diabetes, chronic kidney disease and anemia recently admitted to the hospital with weakness and pseudogout of the knee was discharged to a skilled nursing rehab 9 days ago brought back to the emergency room with elevated creatinine on routine exam at the skilled nursing facility.  Patient is poor historian.  Patient's daughter tells me that she had been looking weak and tired however she has not noticed anything new.  In the emergency room CT scan abdomen pelvis showed prominent distention of the bladder with bilateral hydronephrosis and hydroureter, changes of hepatic cirrhosis with prominent splenic vein varices and splenomegaly.  Creatinine 2.3 and BUN 137.  A Foley catheter was placed.  Admitted for further work-up.  Recent hospitalization 01/02/2021--01/09/2021 with severe fatigue generalized weakness-found to have acute superimposed on chronic diastolic heart failure as well as decompensated pulmonary hypertension--at that admission palliative care consulted  Assessment & Plan:   Principal Problem:   ARF (acute renal failure) ([place]) Active Problems:   Essential hypertension   CAD- severe 3V CAD    Three-vessel CAD-PCI to LAD and LCx, med management of RCA   S/P TAVR (transcatheter aortic valve replacement)   Aortic stenosis, severe   Goals of care, counseling/discussion   Persistent atrial fibrillation (HCC)   AKI (acute kidney injury) ([place])   Acute encephalopathy   Subacute bacterial endocarditis   Cirrhosis of liver without ascites ([place])   Obstructive uropathy  Acute kidney injury on chronic kidney disease stage 3a: Baseline creatinine about 1.5.  Presented with creatinine of 2.35.  CT abdomen at  admission showed prominent urinary bladder with bilateral hydronephrosis and hydroureter, probably postobstructive and dehydration.  Renal function now at baseline now.  Continue and Discharge with Foley catheter until patient has good clinical improvement of her mobility.  Continue Flomax.  Acute metabolic encephalopathy in a patient with multiple medical problems: CT head unremarkable.  Likely metabolic from uremia and UTI.  Urea 135 on presentation.  Trending down.  Ammonia is normal. B 12 and folic acid is normal.  Today, patient is slightly more alert and oriented to place.  Extremely weak.   Enterococcus bacteremia/bacterial endocarditis: Transthoracic echo shows large mitral valve vegetation/endocarditis.  Blood cultures are now growing Enterococcus faecalis.  ID consulted patient has been started on Rocephin and ampicillin.  We have consulted ID.  Patient seen by CT surgery.  Due to multiple comorbidities, she is deemed to be a poor surgical candidate so CT surgery did not recommend any surgery.  ID recommends 6 weeks of IV antibiotics.  Palliative care on board to have discussions with the family about management course from here.  At this point in time, they prefer to continue current management.  UTI: Urinalysis with more than 80,000 Pseudomonas, received cefepime for 5 days, ended on 01/22/2021.  Elevated LFTs with CT scan concerning for hepatic cirrhosis: Hepatitis panel negative.  Mild abnormal LFTs, cardiology discontinued statin and started her on Zetia.  Patient also on amiodarone.  Per cardiology, this needs to be continued as she does not have any other options for her atrial fibrillation.  LFTs improving.  Permanent A. fib on amiodarone and Eliquis: Continue amiodarone.  Therapeutic on Eliquis.  Repeat EKG consistent with chronic left bundle branch block pattern.  Cardiology on  board and managing.  Appreciate their help.  Chronic combined heart failure: Without exacerbation.  Euvolemic  other than some bilateral lower extremity edema but no pulmonary edema.  Received IV fluids.  Cardiology was consulted for this.  She received 1 dose of Lasix on 01/22/2021.  Renal function is stable.  No further diuretics planned by cardiology.  Appreciate their help.  Essential hypertension: Blood pressure controlled.  Continue hydralazine and Imdur.  Hyperlipidemia: Crestor was switched to Zetia by cardiology on 01/23/2021 due to elevated LFTs and the need for continuation of amiodarone in order to protect liver  Debility: PT OT recommends SNF.  TOC on board for placement   DVT prophylaxis:  apixaban (ELIQUIS) tablet 5 mg   Code Status: DNR Family Communication: None at bedside.  Discussed over the phone with [name] and then [place] Disposition Plan: Status is: Inpatient.   Dispo: The patient is from: SNF              Anticipated d/c is to: SNF              Patient currently is not medically stable.   Difficult to place patient No    Consultants:  None  Procedures:  None  Antimicrobials:  Rocephin 8/24--- 8/25 Cefepime 8/26--01/22/2021 Rocephin 01/24/2021> Ampicillin 01/24/2021>   Subjective: Patient seen and examined.  Once again she was alert but not fully oriented however she was oriented to place today which she was not yesterday.  Just looks very weak otherwise she has no specific complaints.  Objective: Vitals:   01/25/21 0100 01/25/21 0352 01/25/21 0905 01/25/21 1251  BP:  (!) 134/52 (!) 128/56 (!) 116/43  Pulse:  69 68   Resp:  18 16   Temp: 98.7 F (37.1 C) 98.9 F (37.2 C)    TempSrc: Oral Oral    SpO2:  97% 98%   Weight:      Height:        Intake/Output Summary (Last 24 hours) at 01/25/2021 1314 Last data filed at 01/25/2021 0603 Gross per 24 hour  Intake 645.37 ml  Output 1150 ml  Net -504.63 ml    Filed Weights   01/18/21 1747 01/23/21 1239  Weight: 77.4 kg 77.2 kg    Examination: General exam: Appears calm and comfortable  Respiratory  system: Clear to auscultation. Respiratory effort normal. Cardiovascular system: S1 & S2 heard, RRR. No JVD, murmurs, rubs, gallops but positive for click.  No pedal edema. Gastrointestinal system: Abdomen is nondistended, soft and nontender. No organomegaly or masses felt. Normal bowel sounds heard. Central nervous system: Alert but not oriented.  No focal neurological deficits.  Global weakness. Extremities: Symmetric 5 x 5 power.    Data Reviewed: I have personally reviewed following labs and imaging studies  CBC: Recent Labs  Lab 01/21/21 0314 01/22/21 0515 01/23/21 0638 01/23/21 1519 01/24/21 0205 01/25/21 0044  WBC 4.9 5.7 6.1  --  6.4 6.9  NEUTROABS 3.5 4.5 4.8  --  5.2 5.7  HGB 7.5* 7.2* 8.9* 8.1* 8.1* 8.4*  HCT 24.5* 24.3* 28.4* 25.9* 26.4* 27.9*  MCV 101.7* 101.3* 99.0  --  99.2 101.5*  PLT 99* 95* 94*  --  89* 87*    Basic Metabolic Panel: Recent Labs  Lab 01/20/21 0139 01/21/21 0314 01/22/21 0515 01/23/21 0638 01/24/21 0205 01/25/21 0044  NA 146* 147* 144 145 146* 149*  K 3.4* 3.8 3.8 3.6 3.6 3.8  CL 118* 119* 118* 120* 120* 122*  CO2 24 24 22  21*  23 23  GLUCOSE 163* 146* 130* 120* 131* 165*  BUN 98* 78* 56* 52* 50* 50*  CREATININE 1.72* 1.39* 1.16* 1.24* 1.23* 1.31*  CALCIUM 8.9 8.9 8.9 8.9 8.9 9.1  MG 2.4 2.2  --   --   --   --   PHOS 3.3 2.4*  --   --   --   --     GFR: Estimated Creatinine Clearance: 31.8 mL/min (A) (by C-G formula based on SCr of 1.31 mg/dL (H)). Liver Function Tests: Recent Labs  Lab 01/21/21 0314 01/22/21 0515 01/23/21 0638 01/24/21 0205 01/25/21 0044  AST 102* 91* 80* 62* 60*  ALT 104* 102* 95* 80* 79*  ALKPHOS 89 106 118 111 136*  BILITOT 0.6 0.6 1.1 0.8 0.4  PROT 4.6* 4.6* 4.7* 4.6* 4.9*  ALBUMIN 1.5* 1.5* 1.5* 1.4* 1.5*    Recent Labs  Lab 01/18/21 1827  LIPASE 49    Recent Labs  Lab 01/19/21 0217  AMMONIA 13    Coagulation Profile: Recent Labs  Lab 01/19/21 0217 01/23/21 0638  INR 1.8* 1.4*     Cardiac Enzymes: No results for input(s): CKTOTAL, CKMB, CKMBINDEX, TROPONINI in the last 168 hours. BNP (last 3 results) No results for input(s): PROBNP in the last 8760 hours. HbA1C: No results for input(s): HGBA1C in the last 72 hours. CBG: Recent Labs  Lab 01/24/21 1623 01/24/21 2243 01/25/21 0458 01/25/21 0747 01/25/21 1228  GLUCAP 138* 187* 140* 133* 200*    Lipid Profile: No results for input(s): CHOL, HDL, LDLCALC, TRIG, CHOLHDL, LDLDIRECT in the last 72 hours. Thyroid Function Tests: No results for input(s): TSH, T4TOTAL, FREET4, T3FREE, THYROIDAB in the last 72 hours. Anemia Panel: No results for input(s): VITAMINB12, FOLATE, FERRITIN, TIBC, IRON, RETICCTPCT in the last 72 hours.  Sepsis Labs: No results for input(s): PROCALCITON, LATICACIDVEN in the last 168 hours.  Recent Results (from the past 240 hour(s))  Resp Panel by RT-PCR (Flu A&B, Covid) Nasopharyngeal Swab     Status: None   Collection Time: 01/18/21  6:27 PM   Specimen: Nasopharyngeal Swab; Nasopharyngeal(NP) swabs in vial transport medium  Result Value Ref Range Status   SARS Coronavirus 2 by RT PCR NEGATIVE NEGATIVE Final    Comment: (NOTE) SARS-CoV-2 target nucleic acids are NOT DETECTED.  The SARS-CoV-2 RNA is generally detectable in upper respiratory specimens during the acute phase of infection. The lowest concentration of SARS-CoV-2 viral copies this assay can detect is 138 copies/mL. A negative result does not preclude SARS-Cov-2 infection and should not be used as the sole basis for treatment or other patient management decisions. A negative result may occur with  improper specimen collection/handling, submission of specimen other than nasopharyngeal swab, presence of viral mutation(s) within the areas targeted by this assay, and inadequate number of viral copies(<138 copies/mL). A negative result must be combined with clinical observations, patient history, and  epidemiological information. The expected result is Negative.  Fact Sheet for Patients:  EntrepreneurPulse.com.au  Fact Sheet for Healthcare Providers:  IncredibleEmployment.be  This test is no t yet approved or cleared by the [place] FDA and  has been authorized for detection and/or diagnosis of SARS-CoV-2 by FDA under an Emergency Use Authorization (EUA). This EUA will remain  in effect (meaning this test can be used) for the duration of the COVID-19 declaration under Section 564(b)(1) of the Act, 21 U.S.C.section 360bbb-3(b)(1), unless the authorization is terminated  or revoked sooner.       Influenza A by PCR NEGATIVE  NEGATIVE Final   Influenza B by PCR NEGATIVE NEGATIVE Final    Comment: (NOTE) The Xpert Xpress SARS-CoV-2/FLU/RSV plus assay is intended as an aid in the diagnosis of influenza from Nasopharyngeal swab specimens and should not be used as a sole basis for treatment. Nasal washings and aspirates are unacceptable for Xpert Xpress SARS-CoV-2/FLU/RSV testing.  Fact Sheet for Patients: EntrepreneurPulse.com.au  Fact Sheet for Healthcare Providers: IncredibleEmployment.be  This test is not yet approved or cleared by the [place] FDA and has been authorized for detection and/or diagnosis of SARS-CoV-2 by FDA under an Emergency Use Authorization (EUA). This EUA will remain in effect (meaning this test can be used) for the duration of the COVID-19 declaration under Section 564(b)(1) of the Act, 21 U.S.C. section 360bbb-3(b)(1), unless the authorization is terminated or revoked.  Performed at [facility], [place] [address] [place], [place] [address]   Urine Culture     Status: Abnormal   Collection Time: 01/18/21 11:10 PM   Specimen: Urine, Clean Catch  Result Value Ref Range Status   Specimen Description URINE, CLEAN CATCH  Final   Special Requests   Final     NONE Performed at [facility], [place] [address] [place], [place] [address]    Culture 80,000 COLONIES/mL PSEUDOMONAS AERUGINOSA (A)  Final   Report Status 01/21/2021 FINAL  Final   Organism ID, Bacteria PSEUDOMONAS AERUGINOSA (A)  Final      Susceptibility   Pseudomonas aeruginosa - MIC*    CEFTAZIDIME 4 SENSITIVE Sensitive     CIPROFLOXACIN <=0.25 SENSITIVE Sensitive     GENTAMICIN 2 SENSITIVE Sensitive     IMIPENEM 2 SENSITIVE Sensitive     PIP/TAZO 8 SENSITIVE Sensitive     CEFEPIME 2 SENSITIVE Sensitive     * 80,000 COLONIES/mL PSEUDOMONAS AERUGINOSA  Culture, blood (Routine X 2) w Reflex to ID Panel     Status: None (Preliminary result)   Collection Time: 01/23/21  6:09 PM   Specimen: BLOOD  Result Value Ref Range Status   Specimen Description BLOOD RIGHT ANTECUBITAL  Final   Special Requests   Final    BOTTLES DRAWN AEROBIC AND ANAEROBIC Blood Culture adequate volume   Culture  Setup Time   Final    GRAM POSITIVE COCCI IN CHAINS IN BOTH AEROBIC AND ANAEROBIC BOTTLES Organism ID to follow CRITICAL RESULT CALLED TO, READ BACK BY AND VERIFIED WITH: Ronald Pippins [id number] Performed at [facility], [place] [address] [place], [place] [address]    Culture GRAM POSITIVE COCCI  Final   Report Status PENDING  Incomplete  Culture, blood (Routine X 2) w Reflex to ID Panel     Status: Abnormal (Preliminary result)   Collection Time: 01/23/21  6:09 PM   Specimen: BLOOD RIGHT HAND  Result Value Ref Range Status   Specimen Description BLOOD RIGHT HAND  Final   Special Requests   Final    BOTTLES DRAWN AEROBIC AND ANAEROBIC Blood Culture adequate volume   Culture  Setup Time   Final    GRAM POSITIVE COCCI IN CHAINS IN BOTH AEROBIC AND ANAEROBIC BOTTLES CRITICAL VALUE NOTED.  VALUE IS CONSISTENT WITH PREVIOUSLY REPORTED AND CALLED VALUE. Performed at [facility], [place] [address], [place], [place] [address]    Culture ENTEROCOCCUS FAECALIS (A)  Final   Report  Status PENDING  Incomplete  Blood Culture ID Panel (Reflexed)     Status: Abnormal   Collection Time: 01/23/21  6:09 PM  Result Value Ref Range Status   Enterococcus faecalis DETECTED (A) NOT DETECTED Final    Comment: CRITICAL RESULT CALLED TO, READ BACK BY AND VERIFIED WITH: PHARMD Mervyn Gay [id number] MLM    Enterococcus Faecium NOT DETECTED NOT DETECTED Final   Listeria monocytogenes NOT DETECTED NOT DETECTED Final   Staphylococcus species NOT DETECTED NOT DETECTED Final   Staphylococcus aureus (BCID) NOT DETECTED NOT DETECTED Final   Staphylococcus epidermidis NOT DETECTED NOT DETECTED Final   Staphylococcus lugdunensis NOT DETECTED NOT DETECTED Final   Streptococcus species NOT DETECTED NOT DETECTED Final   Streptococcus agalactiae NOT DETECTED NOT DETECTED Final   Streptococcus pneumoniae NOT DETECTED NOT DETECTED Final   Streptococcus pyogenes NOT DETECTED NOT DETECTED Final   A.calcoaceticus-baumannii NOT DETECTED NOT DETECTED Final   Bacteroides fragilis NOT DETECTED NOT DETECTED Final   Enterobacterales NOT DETECTED NOT DETECTED Final   Enterobacter cloacae complex NOT DETECTED NOT DETECTED Final   Escherichia coli NOT DETECTED NOT DETECTED Final   Klebsiella aerogenes NOT DETECTED NOT DETECTED Final   Klebsiella oxytoca NOT DETECTED NOT DETECTED Final   Klebsiella pneumoniae NOT DETECTED NOT DETECTED Final   Proteus species NOT DETECTED NOT DETECTED Final   Salmonella species NOT DETECTED NOT DETECTED Final   Serratia marcescens NOT DETECTED NOT DETECTED Final   Haemophilus influenzae NOT DETECTED NOT DETECTED Final   Neisseria meningitidis NOT DETECTED NOT DETECTED Final   Pseudomonas aeruginosa NOT DETECTED NOT DETECTED Final   Stenotrophomonas maltophilia NOT DETECTED NOT DETECTED Final   Candida albicans NOT DETECTED NOT DETECTED Final   Candida auris NOT DETECTED NOT DETECTED Final   Candida glabrata NOT DETECTED NOT DETECTED Final   Candida krusei NOT DETECTED NOT  DETECTED Final   Candida parapsilosis NOT DETECTED NOT DETECTED Final   Candida tropicalis NOT DETECTED NOT DETECTED Final   Cryptococcus neoformans/gattii NOT DETECTED NOT DETECTED Final   Vancomycin resistance NOT DETECTED NOT DETECTED Final    Comment: Performed at [facility], [address] [place], [place] [address]          Radiology Studies: ECHOCARDIOGRAM COMPLETE  Result Date: 01/23/2021    ECHOCARDIOGRAM REPORT   Patient Name:   YEILY LINK Date of Exam: 01/23/2021 Medical Rec #:  [id number]           Height:       62.0 in Accession #:    [id number]          Weight:       170.2 lb Date of Birth:  01/06/1936           BSA:          1.785 m Patient Age:    85 years            BP:           120/41 mmHg Patient Gender: F                   HR:           66 bpm. Exam Location:  Inpatient Procedure: 2D Echo, Cardiac Doppler and Color Doppler Indications:    CHF  History:        Patient has prior history of Echocardiogram examinations, most                 recent 11/22/2019. 6mm medtronic valve in the aortic position,  Arrythmias:Atrial Fibrillation; Risk Factors:Hypertension,                 Diabetes and Dyslipidemia. H/O bacteremia.  Sonographer:    Merrie Roof RDCS Referring Phys: [id number] [place]  1. Left ventricular ejection fraction, by estimation, is 45 to 50%. The left ventricle has mildly decreased function. The left ventricle demonstrates global hypokinesis with septal-lateral dyssynchrony due to LBBB. There is moderate left ventricular hypertrophy. Left ventricular diastolic parameters are consistent with Grade I diastolic dysfunction (impaired relaxation).  2. Right ventricular systolic function is normal. The right ventricular size is normal. Tricuspid regurgitation signal is inadequate for assessing PA pressure.  3. Left atrial size was moderately dilated.  4. Right atrial size was moderately dilated.  5. There is a large 1.45 x  1.45 cm mobile, well-circumscribed mass present on the atrial surface at the base of the posterior mitral valve leaflet. This certainly looks like it could be a myxoma but was was not present on 6/21 echo. Cannot rule out vegetation/endocarditis. The mitral valve is degenerative. Mild to moderate mitral valve regurgitation. No evidence of mitral stenosis. Moderate mitral annular calcification.  6. There is a 26 mm Medtronic CoreValve-EvolutR prosthetic (TAVR) valve present in the aortic position. Mean gradient mildly elevated at 17 mmHg. No perivalvular leakage noted. The aortic valve has been repaired/replaced. Aortic valve regurgitation is not visualized.  7. The inferior vena cava is normal in size with greater than 50% respiratory variability, suggesting right atrial pressure of 3 mmHg. FINDINGS  Left Ventricle: Left ventricular ejection fraction, by estimation, is 45 to 50%. The left ventricle has mildly decreased function. The left ventricle demonstrates global hypokinesis. The left ventricular internal cavity size was normal in size. There is  moderate left ventricular hypertrophy. Left ventricular diastolic parameters are consistent with Grade I diastolic dysfunction (impaired relaxation). Right Ventricle: The right ventricular size is normal. No increase in right ventricular wall thickness. Right ventricular systolic function is normal. Tricuspid regurgitation signal is inadequate for assessing PA pressure. Left Atrium: Left atrial size was moderately dilated. Right Atrium: Right atrial size was moderately dilated. Pericardium: There is no evidence of pericardial effusion. Mitral Valve: There is a large 1.45 x 1.45 cm mobile, well-circumscribed mass present on the atrial surface at the base of the posterior mitral valve leaflet. This certainly looks like it could be a myxoma but was was not present on 6/21 echo. Cannot rule out vegetation/endocarditis. The mitral valve is degenerative in appearance. There  is moderate calcification of the mitral valve leaflet(s). Moderate mitral annular calcification. Mild to moderate mitral valve regurgitation. No evidence of mitral valve stenosis. Tricuspid Valve: The tricuspid valve is normal in structure. Tricuspid valve regurgitation is not demonstrated. Aortic Valve: There is a 26 mm Medtronic CoreValve-EvolutR prosthetic (TAVR) valve present in the aortic position. Mean gradient mildly elevated at 17 mmHg. No perivalvular leakage noted. The aortic valve has been repaired/replaced. Aortic valve regurgitation is not visualized. Aortic valve mean gradient measures 17.0 mmHg. Aortic valve peak gradient measures 32.9 mmHg. Aortic valve area, by VTI measures 0.98 cm. There is a 26 mm Medtronic-Hall stented (TAVR) valve present in the aortic position. Pulmonic Valve: The pulmonic valve was normal in structure. Pulmonic valve regurgitation is trivial. Aorta: The aortic root is normal in size and structure. Venous: The inferior vena cava is normal in size with greater than 50% respiratory variability, suggesting right atrial pressure of 3 mmHg. IAS/Shunts: No atrial level shunt detected by color flow Doppler.  LEFT VENTRICLE PLAX 2D LVIDd:         4.70 cm      Diastology LVIDs:         3.40 cm      LV e' medial:  6.20 cm/s LV PW:         1.40 cm      LV e' lateral: 5.11 cm/s LV IVS:        1.40 cm LVOT diam:     1.70 cm LV SV:         56 LV SV Index:   31 LVOT Area:     2.27 cm  LV Volumes (MOD) LV vol d, MOD A4C: 123.0 ml LV vol s, MOD A4C: 41.0 ml LV SV MOD A4C:     123.0 ml RIGHT VENTRICLE RV Basal diam:  4.70 cm RV Mid diam:    3.70 cm LEFT ATRIUM              Index       RIGHT ATRIUM           Index LA diam:        3.80 cm  2.13 cm/m  RA Area:     31.10 cm LA Vol (A2C):   108.0 ml 60.51 ml/m RA Volume:   105.00 ml 58.82 ml/m LA Vol (A4C):   56.6 ml  31.71 ml/m LA Biplane Vol: 80.7 ml  45.21 ml/m  AORTIC VALVE AV Area (Vmax):    0.96 cm AV Area (Vmean):   0.93 cm AV Area  (VTI):     0.98 cm AV Vmax:           287.00 cm/s AV Vmean:          189.000 cm/s AV VTI:            0.566 m AV Peak Grad:      32.9 mmHg AV Mean Grad:      17.0 mmHg LVOT Vmax:         122.00 cm/s LVOT Vmean:        77.600 cm/s LVOT VTI:          0.245 m LVOT/AV VTI ratio: 0.43  AORTA Ao Root diam: 3.10 cm MR Peak grad:    105.9 mmHg MR Vmax:         514.50 cm/s SHUNTS MR PISA:         1.01 cm    Systemic VTI:  0.24 m MR PISA Eff ROA: 6 mm       Systemic Diam: 1.70 cm MR PISA Radius:  0.40 cm Dalton McleanMD Electronically signed by Franki Monte Signature Date/Time: 01/23/2021/5:22:40 PM    Final         Scheduled Meds:  sodium chloride   Intravenous Once   amiodarone  200 mg Oral Daily   apixaban  5 mg Oral BID   Chlorhexidine Gluconate Cloth  6 each Topical Daily   ezetimibe  10 mg Oral Daily   feeding supplement  237 mL Oral TID BM   ferrous sulfate  325 mg Oral Daily   hydrALAZINE  10 mg Oral Q8H   isosorbide mononitrate  15 mg Oral Daily   multivitamin with minerals  1 tablet Oral Daily   tamsulosin  0.4 mg Oral QPC breakfast   vitamin B-12  500 mcg Oral Daily   Continuous Infusions:  ampicillin (OMNIPEN) IV 2 g (01/25/21 1256)   cefTRIAXone (ROCEPHIN)  IV 2 g (01/25/21 1012)  LOS: 6 days   Time spent: 30 minutes  Darliss Cheney, MD [facility]

## 2021-01-25 NOTE — Progress Notes (Signed)
Have spent time today talking with the patients daughter's [name] and [name].  Carrie Monroe has provided me with a living will noting that her sister [name] and brother [name] are the decision makers.  I have reviewed this with the Palliative team and this appears to only for financial purposes and I have made [name] aware of this.    Carrie Monroe also reports that the daughter in law [name] who has nursing background and has been present speaking with the medical team has requested to step out of that position and wants the children to be the primary people to make decisions at this time.    I have updated the Palliative medicine team and the Attending Dr. Doristine Bosworth of these discussions.   [name] RN, BSN, CCRN-K

## 2021-01-25 NOTE — Care Management Important Message (Signed)
Important Message  Patient Details  Name: Carrie Monroe MRN: [id number] Date of Birth: [birth year]   Medicare Important Message Given:  Yes     Shelda Altes 01/25/2021, 12:52 PM

## 2021-01-25 NOTE — Progress Notes (Signed)
[place] for Infectious Disease   Reason for visit: Follow up on native valve infective endocarditis  Interval History: WBC wnl, afebrile. Does not verbalize anything   Day 2 ampicillin + ceftriaxone  Physical Exam: Constitutional:  Vitals:   01/25/21 0352 01/25/21 0905  BP: (!) 134/52 (!) 128/56  Pulse: 69 68  Resp: 18 16  Temp: 98.9 F (37.2 C)   SpO2: 97% 98%   patient appears in NAD, fatigued appearing Respiratory: Normal respiratory effort; CTA B Cardiovascular:RRR GI: soft, nt, nd  Review of Systems: Constitutional: negative for fevers and chills Gastrointestinal: negative for nausea and diarrhea  Lab Results  Component Value Date   WBC 6.9 01/25/2021   HGB 8.4 (L) 01/25/2021   HCT 27.9 (L) 01/25/2021   MCV 101.5 (H) 01/25/2021   PLT 87 (L) 01/25/2021    Lab Results  Component Value Date   CREATININE 1.31 (H) 01/25/2021   BUN 50 (H) 01/25/2021   NA 149 (H) 01/25/2021   K 3.8 01/25/2021   CL 122 (H) 01/25/2021   CO2 23 01/25/2021    Lab Results  Component Value Date   ALT 79 (H) 01/25/2021   AST 60 (H) 01/25/2021   ALKPHOS 136 (H) 01/25/2021     Microbiology: Recent Results (from the past 240 hour(s))  Resp Panel by RT-PCR (Flu A&B, Covid) Nasopharyngeal Swab     Status: None   Collection Time: 01/18/21  6:27 PM   Specimen: Nasopharyngeal Swab; Nasopharyngeal(NP) swabs in vial transport medium  Result Value Ref Range Status   SARS Coronavirus 2 by RT PCR NEGATIVE NEGATIVE Final    Comment: (NOTE) SARS-CoV-2 target nucleic acids are NOT DETECTED.  The SARS-CoV-2 RNA is generally detectable in upper respiratory specimens during the acute phase of infection. The lowest concentration of SARS-CoV-2 viral copies this assay can detect is 138 copies/mL. A negative result does not preclude SARS-Cov-2 infection and should not be used as the sole basis for treatment or other patient management decisions. A negative result may occur with   improper specimen collection/handling, submission of specimen other than nasopharyngeal swab, presence of viral mutation(s) within the areas targeted by this assay, and inadequate number of viral copies(<138 copies/mL). A negative result must be combined with clinical observations, patient history, and epidemiological information. The expected result is Negative.  Fact Sheet for Patients:  EntrepreneurPulse.com.au  Fact Sheet for Healthcare Providers:  IncredibleEmployment.be  This test is no t yet approved or cleared by the [place] FDA and  has been authorized for detection and/or diagnosis of SARS-CoV-2 by FDA under an Emergency Use Authorization (EUA). This EUA will remain  in effect (meaning this test can be used) for the duration of the COVID-19 declaration under Section 564(b)(1) of the Act, 21 U.S.C.section 360bbb-3(b)(1), unless the authorization is terminated  or revoked sooner.       Influenza A by PCR NEGATIVE NEGATIVE Final   Influenza B by PCR NEGATIVE NEGATIVE Final    Comment: (NOTE) The Xpert Xpress SARS-CoV-2/FLU/RSV plus assay is intended as an aid in the diagnosis of influenza from Nasopharyngeal swab specimens and should not be used as a sole basis for treatment. Nasal washings and aspirates are unacceptable for Xpert Xpress SARS-CoV-2/FLU/RSV testing.  Fact Sheet for Patients: EntrepreneurPulse.com.au  Fact Sheet for Healthcare Providers: IncredibleEmployment.be  This test is not yet approved or cleared by the [place] FDA and has been authorized for detection and/or diagnosis of SARS-CoV-2 by FDA under an Emergency Use Authorization (  EUA). This EUA will remain in effect (meaning this test can be used) for the duration of the COVID-19 declaration under Section 564(b)(1) of the Act, 21 U.S.C. section 360bbb-3(b)(1), unless the authorization is terminated  or revoked.  Performed at [facility], [place] [address] [place], [place] [address]   Urine Culture     Status: Abnormal   Collection Time: 01/18/21 11:10 PM   Specimen: Urine, Clean Catch  Result Value Ref Range Status   Specimen Description URINE, CLEAN CATCH  Final   Special Requests   Final    NONE Performed at [facility], [place] [address] [place], [place] [address]    Culture 80,000 COLONIES/mL PSEUDOMONAS AERUGINOSA (A)  Final   Report Status 01/21/2021 FINAL  Final   Organism ID, Bacteria PSEUDOMONAS AERUGINOSA (A)  Final      Susceptibility   Pseudomonas aeruginosa - MIC*    CEFTAZIDIME 4 SENSITIVE Sensitive     CIPROFLOXACIN <=0.25 SENSITIVE Sensitive     GENTAMICIN 2 SENSITIVE Sensitive     IMIPENEM 2 SENSITIVE Sensitive     PIP/TAZO 8 SENSITIVE Sensitive     CEFEPIME 2 SENSITIVE Sensitive     * 80,000 COLONIES/mL PSEUDOMONAS AERUGINOSA  Culture, blood (Routine X 2) w Reflex to ID Panel     Status: None (Preliminary result)   Collection Time: 01/23/21  6:09 PM   Specimen: BLOOD  Result Value Ref Range Status   Specimen Description BLOOD RIGHT ANTECUBITAL  Final   Special Requests   Final    BOTTLES DRAWN AEROBIC AND ANAEROBIC Blood Culture adequate volume   Culture  Setup Time   Final    GRAM POSITIVE COCCI IN CHAINS IN BOTH AEROBIC AND ANAEROBIC BOTTLES Organism ID to follow CRITICAL RESULT CALLED TO, READ BACK BY AND VERIFIED WITH: Ronald Pippins [id number] Performed at [facility], [place] [address], [place], [place] [address]    Culture GRAM POSITIVE COCCI  Final   Report Status PENDING  Incomplete  Culture, blood (Routine X 2) w Reflex to ID Panel     Status: None (Preliminary result)   Collection Time: 01/23/21  6:09 PM   Specimen: BLOOD RIGHT HAND  Result Value Ref Range Status   Specimen Description BLOOD RIGHT HAND  Final   Special Requests   Final    BOTTLES DRAWN AEROBIC AND ANAEROBIC Blood Culture adequate volume   Culture   Setup Time   Final    GRAM POSITIVE COCCI IN CHAINS IN BOTH AEROBIC AND ANAEROBIC BOTTLES CRITICAL VALUE NOTED.  VALUE IS CONSISTENT WITH PREVIOUSLY REPORTED AND CALLED VALUE. Performed at [facility], [place] [address], [place], [place] [address]    Culture [facility] POSITIVE COCCI  Final   Report Status PENDING  Incomplete  Blood Culture ID Panel (Reflexed)     Status: Abnormal   Collection Time: 01/23/21  6:09 PM  Result Value Ref Range Status   Enterococcus faecalis DETECTED (A) NOT DETECTED Final    Comment: CRITICAL RESULT CALLED TO, READ BACK BY AND VERIFIED WITH: PHARMD Mervyn Gay [id number] MLM    Enterococcus Faecium NOT DETECTED NOT DETECTED Final   Listeria monocytogenes NOT DETECTED NOT DETECTED Final   Staphylococcus species NOT DETECTED NOT DETECTED Final   Staphylococcus aureus (BCID) NOT DETECTED NOT DETECTED Final   Staphylococcus epidermidis NOT DETECTED NOT DETECTED Final   Staphylococcus lugdunensis NOT DETECTED NOT DETECTED Final   Streptococcus species NOT DETECTED NOT DETECTED Final  Streptococcus agalactiae NOT DETECTED NOT DETECTED Final   Streptococcus pneumoniae NOT DETECTED NOT DETECTED Final   Streptococcus pyogenes NOT DETECTED NOT DETECTED Final   A.calcoaceticus-baumannii NOT DETECTED NOT DETECTED Final   Bacteroides fragilis NOT DETECTED NOT DETECTED Final   Enterobacterales NOT DETECTED NOT DETECTED Final   Enterobacter cloacae complex NOT DETECTED NOT DETECTED Final   Escherichia coli NOT DETECTED NOT DETECTED Final   Klebsiella aerogenes NOT DETECTED NOT DETECTED Final   Klebsiella oxytoca NOT DETECTED NOT DETECTED Final   Klebsiella pneumoniae NOT DETECTED NOT DETECTED Final   Proteus species NOT DETECTED NOT DETECTED Final   Salmonella species NOT DETECTED NOT DETECTED Final   Serratia marcescens NOT DETECTED NOT DETECTED Final   Haemophilus influenzae NOT DETECTED NOT DETECTED Final   Neisseria meningitidis NOT DETECTED NOT DETECTED Final    Pseudomonas aeruginosa NOT DETECTED NOT DETECTED Final   Stenotrophomonas maltophilia NOT DETECTED NOT DETECTED Final   Candida albicans NOT DETECTED NOT DETECTED Final   Candida auris NOT DETECTED NOT DETECTED Final   Candida glabrata NOT DETECTED NOT DETECTED Final   Candida krusei NOT DETECTED NOT DETECTED Final   Candida parapsilosis NOT DETECTED NOT DETECTED Final   Candida tropicalis NOT DETECTED NOT DETECTED Final   Cryptococcus neoformans/gattii NOT DETECTED NOT DETECTED Final   Vancomycin resistance NOT DETECTED NOT DETECTED Final    Comment: Performed at [facility], [address] [place], [place] [address]    Impression/Plan:  1. Native mitral valve infective endocarditis - Enterococcus in blood cultures and now on ceftriaxone and ampicillin.  Will need a prolonged course of treatment of 6 weeks.  No surgery planned due to patient being high risk for complications.    2.  Malnutrition - decreased albumin and she is not eating well.  Continued decline.  Palliative care involved.   3.  Afib - on amiodarrone for rate control.

## 2021-01-25 NOTE — TOC Progression Note (Signed)
Transition of Care [facility]) - Progression Note    Patient Details  Name: Carrie Monroe MRN: [id number] Date of Birth: 1935/11/23  Transition of Care [facility]) CM/SW [place], [place] Phone Number: 01/25/2021, 1:55 PM  Clinical Narrative:     CSW following patient for SNF placement. CSW will start insurance auth close to patient being medically ready for dc. Patient has SNF bed at [facility]. CSW will continue to follow and assist with dc planning needs.  Expected Discharge Plan: [place] Barriers to Discharge: Continued Medical Work up  Expected Discharge Plan and Services Expected Discharge Plan: [place] In-house Referral: Clinical Social Work     Living arrangements for the past 2 months: [place]                                       Social Determinants of Health (SDOH) Interventions    Readmission Risk Interventions Readmission Risk Prevention Plan 03/17/2020  Transportation Screening Complete  PCP or Specialist Appt within 3-5 Days Complete  HRI or [place] Complete  Social Work Consult for [place] Planning/Counseling Complete  Palliative Care Screening Not Applicable  Medication Review Press photographer) Complete  Some recent data might be hidden

## 2021-01-25 NOTE — Progress Notes (Addendum)
Progress Note  Patient Name: Carrie Monroe Date of Encounter: 01/25/2021  Primary Cardiologist: Sanda Klein, MD  Subjective   Diffusely weak. Denies pain, SOB. Unable to tell me the date. Very debilitated but appears comfortable.  Inpatient Medications    Scheduled Meds:  sodium chloride   Intravenous Once   amiodarone  200 mg Oral Daily   apixaban  2.5 mg Oral BID   Chlorhexidine Gluconate Cloth  6 each Topical Daily   ezetimibe  10 mg Oral Daily   feeding supplement  237 mL Oral TID BM   ferrous sulfate  325 mg Oral Daily   hydrALAZINE  10 mg Oral Q8H   isosorbide mononitrate  15 mg Oral Daily   multivitamin with minerals  1 tablet Oral Daily   tamsulosin  0.4 mg Oral QPC breakfast   vitamin B-12  500 mcg Oral Daily   Continuous Infusions:  ampicillin (OMNIPEN) IV 2 g (01/25/21 0603)   cefTRIAXone (ROCEPHIN)  IV 2 g (01/24/21 2200)   PRN Meds:    Vital Signs    Vitals:   01/24/21 2105 01/24/21 2106 01/25/21 0100 01/25/21 0352  BP:  (!) 117/47  (!) 134/52  Pulse:  79  69  Resp:  20  18  Temp: 98.1 F (36.7 C)  98.7 F (37.1 C) 98.9 F (37.2 C)  TempSrc: Oral  Oral Oral  SpO2:  98%  97%  Weight:      Height:        Intake/Output Summary (Last 24 hours) at 01/25/2021 0911 Last data filed at 01/25/2021 0603 Gross per 24 hour  Intake 645.37 ml  Output 1150 ml  Net -504.63 ml   Last 3 Weights 01/23/2021 01/18/2021 01/09/2021  Weight (lbs) 170 lb 3.1 oz 170 lb 10.2 oz 170 lb 10.2 oz  Weight (kg) 77.2 kg 77.4 kg 77.4 kg     Telemetry    NSR - Personally Reviewed  Physical Exam   GEN: Chronically ill and frail appearing HEENT: Normocephalic, atraumatic, sclera non-icteric. Neck: No JVD or bruits. Cardiac: RRR, no murmurs, rubs, or gallops.  Respiratory: Clear to auscultation bilaterally. Breathing is unlabored. GI: Soft, nontender, non-distended, BS +x 4. MS: generalized atrophy Extremities: No clubbing or cyanosis. 1+ soft pitting BLE  edema. Distal pedal pulses are 2+ and equal bilaterally. Neuro:  A+O to self, place, not time. Closes eyes frequently during conversation, otherwise not very conversant. Diffusely weak with poor effort towards muscular commands Psych:  Non-agitated  Labs    High Sensitivity Troponin:  No results for input(s): TROPONINIHS in the last 720 hours.    Cardiac EnzymesNo results for input(s): TROPONINI in the last 168 hours. No results for input(s): TROPIPOC in the last 168 hours.   Chemistry Recent Labs  Lab 01/23/21 0638 01/24/21 0205 01/25/21 0044  NA 145 146* 149*  K 3.6 3.6 3.8  CL 120* 120* 122*  CO2 21* 23 23  GLUCOSE 120* 131* 165*  BUN 52* 50* 50*  CREATININE 1.24* 1.23* 1.31*  CALCIUM 8.9 8.9 9.1  PROT 4.7* 4.6* 4.9*  ALBUMIN 1.5* 1.4* 1.5*  AST 80* 62* 60*  ALT 95* 80* 79*  ALKPHOS 118 111 136*  BILITOT 1.1 0.8 0.4  GFRNONAA 43* 44* 41*  ANIONGAP 4* 3* 4*     Hematology Recent Labs  Lab 01/23/21 0638 01/23/21 1519 01/24/21 0205 01/25/21 0044  WBC 6.1  --  6.4 6.9  RBC 2.87*  --  2.66* 2.75*  HGB 8.9* 8.1*  8.1* 8.4*  HCT 28.4* 25.9* 26.4* 27.9*  MCV 99.0  --  99.2 101.5*  MCH 31.0  --  30.5 30.5  MCHC 31.3  --  30.7 30.1  RDW 16.9*  --  16.8* 17.0*  PLT 94*  --  89* 87*    BNPNo results for input(s): BNP, PROBNP in the last 168 hours.   DDimer No results for input(s): DDIMER in the last 168 hours.   Radiology    ECHOCARDIOGRAM COMPLETE  Result Date: 01/23/2021    ECHOCARDIOGRAM REPORT   Patient Name:   RUTHER EPHRAIM Date of Exam: 01/23/2021 Medical Rec #:  [id number]           Height:       62.0 in Accession #:    [id number]          Weight:       170.2 lb Date of Birth:  06-19-1935           BSA:          1.785 m Patient Age:    15 years            BP:           120/41 mmHg Patient Gender: F                   HR:           66 bpm. Exam Location:  Inpatient Procedure: 2D Echo, Cardiac Doppler and Color Doppler Indications:    CHF  History:         Patient has prior history of Echocardiogram examinations, most                 recent 11/22/2019. 8m medtronic valve in the aortic position,                 Arrythmias:Atrial Fibrillation; Risk Factors:Hypertension,                 Diabetes and Dyslipidemia. H/O bacteremia.  Sonographer:    RMerrie RoofRDCS Referring Phys: [id number] 1. Left ventricular ejection fraction, by estimation, is 45 to 50%. The left ventricle has mildly decreased function. The left ventricle demonstrates global hypokinesis with septal-lateral dyssynchrony due to LBBB. There is moderate left ventricular hypertrophy. Left ventricular diastolic parameters are consistent with Grade I diastolic dysfunction (impaired relaxation).  2. Right ventricular systolic function is normal. The right ventricular size is normal. Tricuspid regurgitation signal is inadequate for assessing PA pressure.  3. Left atrial size was moderately dilated.  4. Right atrial size was moderately dilated.  5. There is a large 1.45 x 1.45 cm mobile, well-circumscribed mass present on the atrial surface at the base of the posterior mitral valve leaflet. This certainly looks like it could be a myxoma but was was not present on 6/21 echo. Cannot rule out vegetation/endocarditis. The mitral valve is degenerative. Mild to moderate mitral valve regurgitation. No evidence of mitral stenosis. Moderate mitral annular calcification.  6. There is a 26 mm Medtronic CoreValve-EvolutR prosthetic (TAVR) valve present in the aortic position. Mean gradient mildly elevated at 17 mmHg. No perivalvular leakage noted. The aortic valve has been repaired/replaced. Aortic valve regurgitation is not visualized.  7. The inferior vena cava is normal in size with greater than 50% respiratory variability, suggesting right atrial pressure of 3 mmHg. FINDINGS  Left Ventricle: Left ventricular ejection fraction, by estimation, is 45 to 50%. The left ventricle has mildly  decreased function. The left ventricle demonstrates global hypokinesis. The left ventricular internal cavity size was normal in size. There is  moderate left ventricular hypertrophy. Left ventricular diastolic parameters are consistent with Grade I diastolic dysfunction (impaired relaxation). Right Ventricle: The right ventricular size is normal. No increase in right ventricular wall thickness. Right ventricular systolic function is normal. Tricuspid regurgitation signal is inadequate for assessing PA pressure. Left Atrium: Left atrial size was moderately dilated. Right Atrium: Right atrial size was moderately dilated. Pericardium: There is no evidence of pericardial effusion. Mitral Valve: There is a large 1.45 x 1.45 cm mobile, well-circumscribed mass present on the atrial surface at the base of the posterior mitral valve leaflet. This certainly looks like it could be a myxoma but was was not present on 6/21 echo. Cannot rule out vegetation/endocarditis. The mitral valve is degenerative in appearance. There is moderate calcification of the mitral valve leaflet(s). Moderate mitral annular calcification. Mild to moderate mitral valve regurgitation. No evidence of mitral valve stenosis. Tricuspid Valve: The tricuspid valve is normal in structure. Tricuspid valve regurgitation is not demonstrated. Aortic Valve: There is a 26 mm Medtronic CoreValve-EvolutR prosthetic (TAVR) valve present in the aortic position. Mean gradient mildly elevated at 17 mmHg. No perivalvular leakage noted. The aortic valve has been repaired/replaced. Aortic valve regurgitation is not visualized. Aortic valve mean gradient measures 17.0 mmHg. Aortic valve peak gradient measures 32.9 mmHg. Aortic valve area, by VTI measures 0.98 cm. There is a 26 mm Medtronic-Hall stented (TAVR) valve present in the aortic position. Pulmonic Valve: The pulmonic valve was normal in structure. Pulmonic valve regurgitation is trivial. Aorta: The aortic root is  normal in size and structure. Venous: The inferior vena cava is normal in size with greater than 50% respiratory variability, suggesting right atrial pressure of 3 mmHg. IAS/Shunts: No atrial level shunt detected by color flow Doppler.  LEFT VENTRICLE PLAX 2D LVIDd:         4.70 cm      Diastology LVIDs:         3.40 cm      LV e' medial:  6.20 cm/s LV PW:         1.40 cm      LV e' lateral: 5.11 cm/s LV IVS:        1.40 cm LVOT diam:     1.70 cm LV SV:         56 LV SV Index:   31 LVOT Area:     2.27 cm  LV Volumes (MOD) LV vol d, MOD A4C: 123.0 ml LV vol s, MOD A4C: 41.0 ml LV SV MOD A4C:     123.0 ml RIGHT VENTRICLE RV Basal diam:  4.70 cm RV Mid diam:    3.70 cm LEFT ATRIUM              Index       RIGHT ATRIUM           Index LA diam:        3.80 cm  2.13 cm/m  RA Area:     31.10 cm LA Vol (A2C):   108.0 ml 60.51 ml/m RA Volume:   105.00 ml 58.82 ml/m LA Vol (A4C):   56.6 ml  31.71 ml/m LA Biplane Vol: 80.7 ml  45.21 ml/m  AORTIC VALVE AV Area (Vmax):    0.96 cm AV Area (Vmean):   0.93 cm AV Area (VTI):     0.98 cm AV Vmax:  287.00 cm/s AV Vmean:          189.000 cm/s AV VTI:            0.566 m AV Peak Grad:      32.9 mmHg AV Mean Grad:      17.0 mmHg LVOT Vmax:         122.00 cm/s LVOT Vmean:        77.600 cm/s LVOT VTI:          0.245 m LVOT/AV VTI ratio: 0.43  AORTA Ao Root diam: 3.10 cm MR Peak grad:    105.9 mmHg MR Vmax:         514.50 cm/s SHUNTS MR PISA:         1.01 cm    Systemic VTI:  0.24 m MR PISA Eff ROA: 6 mm       Systemic Diam: 1.70 cm MR PISA Radius:  0.40 cm Dalton McleanMD Electronically signed by Franki Monte Signature Date/Time: 01/23/2021/5:22:40 PM    Final     Cardiac Studies   2D Echo 01/23/21   1. Left ventricular ejection fraction, by estimation, is 45 to 50%. The  left ventricle has mildly decreased function. The left ventricle  demonstrates global hypokinesis with septal-lateral dyssynchrony due to  LBBB. There is moderate left ventricular   hypertrophy. Left ventricular diastolic parameters are consistent with  Grade I diastolic dysfunction (impaired relaxation).   2. Right ventricular systolic function is normal. The right ventricular  size is normal. Tricuspid regurgitation signal is inadequate for assessing  PA pressure.   3. Left atrial size was moderately dilated.   4. Right atrial size was moderately dilated.   5. There is a large 1.45 x 1.45 cm mobile, well-circumscribed mass  present on the atrial surface at the base of the posterior mitral valve  leaflet. This certainly looks like it could be a myxoma but was was not  present on 6/21 echo. Cannot rule out  vegetation/endocarditis. The mitral valve is degenerative. Mild to  moderate mitral valve regurgitation. No evidence of mitral stenosis.  Moderate mitral annular calcification.   6. There is a 26 mm Medtronic CoreValve-EvolutR prosthetic (TAVR) valve  present in the aortic position. Mean gradient mildly elevated at 17 mmHg.  No perivalvular leakage noted. The aortic valve has been  repaired/replaced. Aortic valve regurgitation is  not visualized.   7. The inferior vena cava is normal in size with greater than 50%  respiratory variability, suggesting right atrial pressure of 3 mmHg.   Patient Profile     85 y.o. female with HFmrEF, severe AS s/p TAVR 11/8293 (complicated by severe groin hematoma and hemorrhagic shock), CAD with multivessel PCI in 2018, carotid arterial disease (s/p R TCAR 02/2019, followed by [facility]), persistent atrial fibrillation, left bundle branch block, breast cancer s/p lumpectomy/radiation, diabetes, hypertension, hyperlipidemia, CKD III, LBBB, anemia/thrombocytopenia admitted with AMS/metabolic encephalopathy and obstructive uropathy requiring foley with AKI on CKD stage IIIa. Cardiology consulted for rhythm management in setting of cirrhosis (amiodarone used) and als found to have mitral valve endocarditis. Other issues include severely low  albumin of 1.4, hypernatremia, continued anemia.  Assessment & Plan    1. Mitral valve endocarditis, also history of TAVR - she has had slow decline for several weeks - a picture consistent with overall failure to thrive. I met her in 2021 and she was on the decline then, but comes in with endocarditis this time around - Dr. Audie Box entertained idea of TEE although CVTS  does not feel she would be a candidate for procedures - she is very weak and debilitated, palliative care involved, so suspect we will defer at this time - will confirm with MD - on abx, ID involved  2. Persistent atrial fibrillation, complicated by prior issues of sinus bradycardia - also concern for cirrhosis as well with minimally elevated LFTs - we will continue lower dose amiodarone 254m daily for now given limited options for rate control - goal is symptom control at this time  - per d/w MD, increase Eliquis to 56mBID (age >8>53ut Cr <1.5 and weight >60kg) - got 2.52m6mhis AM so will give another 2.52mg50mw and increase to 52mg 39mlet BID qhs - need to follow renal function  3. Chronic combined CHF - mild pitting edema noted on exam likely worsened by severely low albumin - hold off diuretics given recent AKI, utility with third spacing - continue Imdur/hydralazine as tolerated - avoid ACEI/ARB/ARNI/spiro given CKD  4. CAD s/p prior PCI - no angina - not on ASA due to concomitant Eliquis - not on BB at this time - had prior issues with bradycardia hoping to avoid PPM - Zetia being continued but no statin due to cirrhotic changes - could consider dc of Zetia if objective is palliation  For questions or updates, please contact [facility] Please consult [email or web address] for contact info under Cardiology/STEMI.  Signed, DaynaCharlie PitterC 01/25/2021, 9:11 AM

## 2021-01-25 NOTE — Progress Notes (Signed)
Palliative Medicine RN Note: Our team rec'd a call from [facility]. [name], who met with our PA Josseline yesterday, no longer wants to be involved with decisions. Family emailed a copy of pt's will; there is no information/designation for HCPOA (only Geologist, engineering for financial trust), and no advance directive/living will information is in the document either.  Unfortunately, our team is not a 24 hour service. I have sent a secure chat to the team members who are on and to [facility], the unit director to see if anyone is free to see her today. If not, and the pt needs immediate intervention, Dr Doristine Bosworth [facility] attending) will need to step in.  Marjie Skiff Finnbar Cedillos, RN, BSN, [facility] Palliative Medicine Team 01/25/2021 2:43 PM Office [phone]

## 2021-01-26 DIAGNOSIS — I330 Acute and subacute infective endocarditis: Secondary | ICD-10-CM | POA: Diagnosis not present

## 2021-01-26 LAB — CBC WITH DIFFERENTIAL/PLATELET
Abs Immature Granulocytes: 0.03 10*3/uL (ref 0.00–0.07)
Basophils Absolute: 0 10*3/uL (ref 0.0–0.1)
Basophils Relative: 0 %
Eosinophils Absolute: 0.2 10*3/uL (ref 0.0–0.5)
Eosinophils Relative: 3 %
HCT: 24.9 % — ABNORMAL LOW (ref 36.0–46.0)
Hemoglobin: 7.5 g/dL — ABNORMAL LOW (ref 12.0–15.0)
Immature Granulocytes: 1 %
Lymphocytes Relative: 11 %
Lymphs Abs: 0.6 10*3/uL — ABNORMAL LOW (ref 0.7–4.0)
MCH: 30.7 pg (ref 26.0–34.0)
MCHC: 30.1 g/dL (ref 30.0–36.0)
MCV: 102 fL — ABNORMAL HIGH (ref 80.0–100.0)
Monocytes Absolute: 0.3 10*3/uL (ref 0.1–1.0)
Monocytes Relative: 5 %
Neutro Abs: 4.2 10*3/uL (ref 1.7–7.7)
Neutrophils Relative %: 80 %
Platelets: 88 10*3/uL — ABNORMAL LOW (ref 150–400)
RBC: 2.44 MIL/uL — ABNORMAL LOW (ref 3.87–5.11)
RDW: 16.8 % — ABNORMAL HIGH (ref 11.5–15.5)
WBC: 5.3 10*3/uL (ref 4.0–10.5)
nRBC: 0 % (ref 0.0–0.2)

## 2021-01-26 LAB — GLUCOSE, CAPILLARY
Glucose-Capillary: 183 mg/dL — ABNORMAL HIGH (ref 70–99)
Glucose-Capillary: 235 mg/dL — ABNORMAL HIGH (ref 70–99)
Glucose-Capillary: 204 mg/dL — ABNORMAL HIGH (ref 70–99)
Glucose-Capillary: 282 mg/dL — ABNORMAL HIGH (ref 70–99)
Glucose-Capillary: 157 mg/dL — ABNORMAL HIGH (ref 70–99)
Glucose-Capillary: 210 mg/dL — ABNORMAL HIGH (ref 70–99)

## 2021-01-26 LAB — COMPREHENSIVE METABOLIC PANEL
ALT: 61 U/L — ABNORMAL HIGH (ref 0–44)
AST: 53 U/L — ABNORMAL HIGH (ref 15–41)
Albumin: 1.4 g/dL — ABNORMAL LOW (ref 3.5–5.0)
Alkaline Phosphatase: 145 U/L — ABNORMAL HIGH (ref 38–126)
Anion gap: 4 — ABNORMAL LOW (ref 5–15)
BUN: 49 mg/dL — ABNORMAL HIGH (ref 8–23)
CO2: 25 mmol/L (ref 22–32)
Calcium: 8.9 mg/dL (ref 8.9–10.3)
Chloride: 121 mmol/L — ABNORMAL HIGH (ref 98–111)
Creatinine, Ser: 1.14 mg/dL — ABNORMAL HIGH (ref 0.44–1.00)
GFR, Estimated: 48 mL/min — ABNORMAL LOW (ref >60)
Glucose, Bld: 191 mg/dL — ABNORMAL HIGH (ref 70–99)
Potassium: 3.7 mmol/L (ref 3.5–5.1)
Sodium: 150 mmol/L — ABNORMAL HIGH (ref 135–145)
Total Bilirubin: <0.1 mg/dL — ABNORMAL LOW (ref 0.3–1.2)
Total Protein: 4.5 g/dL — ABNORMAL LOW (ref 6.5–8.1)

## 2021-01-26 LAB — CULTURE, BLOOD (ROUTINE X 2)
Special Requests: ADEQUATE
Special Requests: ADEQUATE

## 2021-01-26 LAB — HEMOGLOBIN A1C
Hgb A1c MFr Bld: 5.7 % — ABNORMAL HIGH (ref 4.8–5.6)
Mean Plasma Glucose: 116.89 mg/dL

## 2021-01-26 MED ORDER — INSULIN ASPART 100 UNIT/ML IJ SOLN
0.0000 [IU] | Freq: Three times a day (TID) | INTRAMUSCULAR | Status: DC
  Administered 2021-01-26: 5 [IU] via SUBCUTANEOUS
  Administered 2021-01-30: 2 [IU] via SUBCUTANEOUS

## 2021-01-26 MED ORDER — INSULIN ASPART 100 UNIT/ML IJ SOLN
0.0000 [IU] | Freq: Every day | INTRAMUSCULAR | Status: DC
  Administered 2021-01-26: 3 [IU] via SUBCUTANEOUS

## 2021-01-26 MED ORDER — MORPHINE SULFATE (PF) 2 MG/ML IV SOLN
1.0000 mg | INTRAVENOUS | Status: DC | PRN
  Administered 2021-01-26 – 2021-01-31 (×2): 1 mg via INTRAVENOUS
  Filled 2021-01-26 (×2): qty 1

## 2021-01-26 NOTE — Progress Notes (Signed)
Occupational Therapy Treatment Patient Details Name: Carrie Monroe MRN: [id number] DOB: 04/08/1936 Today's Date: 01/26/2021    History of present illness 85 y.o. female presents to [facility] from SNF on 01/18/2021 with weakness and pseudogout of knee along with elevated creatinine. CT abdomen/pelvis demonstrates bladder distention with bilateral hydronephrosis and hydroureter, changes of hepatic cirrhosis with prominent splenic vein varices and splenomegaly. Pt found to have UTI.   OT comments  Pt continues to present with significant weakness and decreased activity tolerance. Today due to edema in BUE focus was on exercises at bed level, functional grooming tasks at bed level, and repositioning for comfort. Please see exercise section below for details. Pt was able to perform limited grooming with max A for physical assist and encouragement. Educated pt on importance of BUE exercises for edema management and pain management. Pt verbalized understanding that she is to complete 10x per hour throughout the day.  BUE propped up with pillows at the end of the session to assist with edema as well. OT will continue to follow acutely and POC remains appropriate.    Follow Up Recommendations  SNF;Supervision/Assistance - 24 hour    Equipment Recommendations  Wheelchair (measurements OT);Wheelchair cushion (measurements OT);Hospital bed;Other (comment) (hoyer lift)    Recommendations for Other Services      Precautions / Restrictions Precautions Precautions: Fall;Other (comment) Precaution Comments: painful sore on buttocks Restrictions Weight Bearing Restrictions: No       Mobility Bed Mobility Overal bed mobility: Needs Assistance   Rolling: Max assist;+2 for physical assistance;+2 for safety/equipment;Total assist         General bed mobility comments: max A +2 for rolling for pillow placement to relieve bottom, also total A +2 for boost up in bed    Transfers                  General transfer comment: NT this session    Balance                                           ADL either performed or assessed with clinical judgement   ADL Overall ADL's : Needs assistance/impaired     Grooming: Wash/dry face;Brushing hair;Minimal assistance;Total assistance;Bed level Grooming Details (indicate cue type and reason): Pt was able to bring hands to face for warm wash cloth, when asked to brush her hair she stated " I can't" and was total A. OT was able to remove several matts from the back of her head.                 Toilet Transfer: Maximal assistance;+2 for safety/equipment;+2 for physical assistance Toilet Transfer Details (indicate cue type and reason): rolling at bed level for clean up Toileting- Clothing Manipulation and Hygiene: Total assistance;+2 for safety/equipment;+2 for physical assistance;Bed level         General ADL Comments: Pt with noted edema in BUE and weakness/decreased activity tolerance for ADL -     Vision   Additional Comments: initially requiring cues to keep eyes open, more alert by the end of session   Perception     Praxis      Cognition Arousal/Alertness: Awake/alert (initially lethargic) Behavior During Therapy: Flat affect Overall Cognitive Status: Impaired/Different from baseline Area of Impairment: Attention;Memory;Following commands;Safety/judgement;Awareness;Problem solving  Current Attention Level: Sustained Memory: Decreased short-term memory Following Commands: Follows one step commands with increased time Safety/Judgement: Decreased awareness of safety;Decreased awareness of deficits Awareness: Intellectual Problem Solving: Slow processing;Decreased initiation;Requires verbal cues;Requires tactile cues General Comments: Pt initially very lethargic, improved with exercises but required multimodal cues for task initiation and attention to task         Exercises Exercises: Other exercises Other Exercises Other Exercises: AAROM/PROM at digits (x10) wrists (x10, elbows (x10) and shoulder FF to 90 (x10) for BUE   Shoulder Instructions       General Comments VSS on RA    Pertinent Vitals/ Pain       Pain Assessment: Faces Faces Pain Scale: Hurts even more Pain Location: buttocks Pain Descriptors / Indicators: Grimacing;Moaning;Discomfort Pain Intervention(s): Limited activity within patient's tolerance;Monitored during session;Repositioned  Home Living                                          Prior Functioning/Environment              Frequency  Min 2X/week        Progress Toward Goals  OT Goals(current goals can now be found in the care plan section)  Progress towards OT goals: Not progressing toward goals - comment (fatigue/weakness)  Acute Rehab OT Goals Patient Stated Goal: reduce swelling in hands OT Goal Formulation: With patient Time For Goal Achievement: 02/05/21 Potential to Achieve Goals: [place] Discharge plan remains appropriate;Frequency remains appropriate;Equipment recommendations need to be updated    Co-evaluation    PT/OT/SLP Co-Evaluation/Treatment: Yes Reason for Co-Treatment: For patient/therapist safety;To address functional/ADL transfers PT goals addressed during session: Strengthening/ROM OT goals addressed during session: ADL's and self-care;Strengthening/ROM      AM-PAC OT "6 Clicks" Daily Activity     Outcome Measure   Help from another person eating meals?: Total Help from another person taking care of personal grooming?: A Lot Help from another person toileting, which includes using toliet, bedpan, or urinal?: Total Help from another person bathing (including washing, rinsing, drying)?: Total Help from another person to put on and taking off regular upper body clothing?: Total Help from another person to put on and taking off regular lower body clothing?:  Total 6 Click Score: 7    End of Session    OT Visit Diagnosis: Other abnormalities of gait and mobility (R26.89);Muscle weakness (generalized) (M62.81);Other symptoms and signs involving cognitive function;Pain Pain - part of body:  (bottom)   Activity Tolerance Patient tolerated treatment well   Patient Left in bed;with call bell/phone within reach;with bed alarm set;with nursing/sitter in room   Nurse Communication Mobility status        Time: 4010-2725 OT Time Calculation (min): 30 min  Charges: OT General Charges $OT Visit: 1 Visit OT Treatments $Self Care/Home Management : 8-22 mins  Jesse Sans OTR/L Acute Rehabilitation Services Pager: [phone] Office: [place] 01/26/2021, 10:02 AM

## 2021-01-26 NOTE — TOC Progression Note (Signed)
Transition of Care [facility]) - Progression Note    Patient Details  Name: Carrie Monroe MRN: [id number] Date of Birth: [birth year]  Transition of Care [facility]) CM/SW [place], [place] Phone Number: 01/26/2021, 2:34 PM  Clinical Narrative:     Patient is from [facility] SNF. Palliative is following.CSW will continue to follow and assist with dc planning needs.  Expected Discharge Plan: [place] Barriers to Discharge: Continued Medical Work up  Expected Discharge Plan and Services Expected Discharge Plan: [place] In-house Referral: Clinical Social Work     Living arrangements for the past 2 months: [place]                                       Social Determinants of Health (SDOH) Interventions    Readmission Risk Interventions Readmission Risk Prevention Plan 03/17/2020  Transportation Screening Complete  PCP or Specialist Appt within 3-5 Days Complete  HRI or [place] Complete  Social Work Consult for [place] Planning/Counseling Complete  Palliative Care Screening Not Applicable  Medication Review Press photographer) Complete  Some recent data might be hidden

## 2021-01-26 NOTE — Progress Notes (Signed)
Physical Therapy Treatment Patient Details Name: ADALENE GULOTTA MRN: [id number] DOB: December 21, 1935 Today's Date: 01/26/2021    History of Present Illness 85 y.o. female presents to [facility] from SNF on 01/18/2021 with weakness and pseudogout of knee along with elevated creatinine. CT abdomen/pelvis demonstrates bladder distention with bilateral hydronephrosis and hydroureter, changes of hepatic cirrhosis with prominent splenic vein varices and splenomegaly. Pt found to have UTI.    PT Comments    Plan was for hoyer lift to chair, but upon arrival pt was lethargic and displayed edema in her bil UE. Thus, decided to focus session on increasing arousal, increasing bil upper and lower extremity AROM and strength, core strength, and reducing edema, see Exercises below. Pt fatigues easily and needs AAROM for all exercises due to significant muscle weakness. Pt positioned rolled off L buttocks slightly with use of pillows and bed in chair-like position to encourage improved pulmonary function. Will continue to follow acutely. Current recommendations remain appropriate.   Follow Up Recommendations  SNF     Equipment Recommendations  Wheelchair (measurements PT);Wheelchair cushion (measurements PT);3in1 (PT);Hospital bed (hoyer lift)    Recommendations for Other Services       Precautions / Restrictions Precautions Precautions: Fall;Other (comment) Precaution Comments: painful sore on buttocks Restrictions Weight Bearing Restrictions: No    Mobility  Bed Mobility Overal bed mobility: Needs Assistance Bed Mobility: Rolling;Supine to Sit Rolling: Max assist;+2 for physical assistance;+2 for safety/equipment;Total assist   Supine to sit: Max assist;+2 for physical assistance;HOB elevated     General bed mobility comments: max A +2 for rolling for pillow placement to relieve bottom, also total A +2 for boost up in bed. Supine > long-sit from elevated HOB with maxAx2.    Transfers                  General transfer comment: NT this session  Ambulation/Gait             General Gait Details: unable   Stairs             Wheelchair Mobility    Modified Rankin (Stroke Patients Only)       Balance                                            Cognition Arousal/Alertness: Awake/alert (initially lethargic) Behavior During Therapy: Flat affect Overall Cognitive Status: Impaired/Different from baseline Area of Impairment: Attention;Memory;Following commands;Safety/judgement;Awareness;Problem solving                   Current Attention Level: Sustained Memory: Decreased short-term memory Following Commands: Follows one step commands with increased time Safety/Judgement: Decreased awareness of safety;Decreased awareness of deficits Awareness: Intellectual Problem Solving: Slow processing;Decreased initiation;Requires verbal cues;Requires tactile cues General Comments: Pt initially very lethargic, improved with exercises but required multimodal cues for task initiation and attention to task. Pt needs repeated cues to remind her of task at hand.      Exercises General Exercises - Upper Extremity Shoulder Flexion: AAROM;Both;10 reps;Supine Elbow Flexion: AAROM;Both;10 reps;Supine Elbow Extension: AAROM;Both;10 reps;Supine Digit Composite Flexion: AAROM;Both;10 reps;Supine Composite Extension: AAROM;Both;10 reps;Supine General Exercises - Lower Extremity Ankle Circles/Pumps: AAROM;Both;10 reps;Supine Short Arc Quad: AAROM;Both;5 reps;Supine Heel Slides: AAROM;Both;5 reps;Supine Other Exercises Other Exercises: AAROM maxAx2 to perform modified sit-ups from elevated HOB, cuing to slide hands down legs, 5x    General Comments General comments (skin  integrity, edema, etc.): VSS on RA; pt left sitting up in chair position in bed      Pertinent Vitals/Pain Pain Assessment: Faces Faces Pain Scale: Hurts even more Pain  Location: buttocks Pain Descriptors / Indicators: Grimacing;Moaning;Discomfort Pain Intervention(s): Limited activity within patient's tolerance;Monitored during session;Repositioned    Home Living                      Prior Function            PT Goals (current goals can now be found in the care plan section) Acute Rehab PT Goals Patient Stated Goal: reduce swelling in hands PT Goal Formulation: With patient Time For Goal Achievement: 02/04/21 Potential to Achieve Goals: [place] Progress towards PT goals: Progressing toward goals (limited by pain and lehtargy)    Frequency    Min 2X/week      PT Plan Current plan remains appropriate    Co-evaluation PT/OT/SLP Co-Evaluation/Treatment: Yes Reason for Co-Treatment: For patient/therapist safety;To address functional/ADL transfers PT goals addressed during session: Strengthening/ROM OT goals addressed during session: ADL's and self-care;Strengthening/ROM      AM-PAC PT "6 Clicks" Mobility   Outcome Measure  Help needed turning from your back to your side while in a flat bed without using bedrails?: Total Help needed moving from lying on your back to sitting on the side of a flat bed without using bedrails?: Total Help needed moving to and from a bed to a chair (including a wheelchair)?: Total Help needed standing up from a chair using your arms (e.g., wheelchair or bedside chair)?: Total Help needed to walk in hospital room?: Total Help needed climbing 3-5 steps with a railing? : Total 6 Click Score: 6    End of Session   Activity Tolerance: Patient limited by fatigue;Patient limited by pain Patient left: in bed;with call bell/phone within reach;with bed alarm set   PT Visit Diagnosis: Other abnormalities of gait and mobility (R26.89);Muscle weakness (generalized) (M62.81);Pain;Difficulty in walking, not elsewhere classified (R26.2) Pain - Right/Left: Left Pain - part of body:  (buttocks)     Time:  1224-8250 PT Time Calculation (min) (ACUTE ONLY): 20 min  Charges:  $Therapeutic Exercise: 8-22 mins                     [name], PT, DPT Acute Rehabilitation Services  Pager: [phone] Office: [place] 01/26/2021, 10:09 AM

## 2021-01-26 NOTE — Progress Notes (Signed)
Carrie Monroe for Infectious Disease   Reason for visit: Follow up on infective endocarditis  Interval History: WBC wnl, remains afebrile.  Main compliant is of her 'butt' bothering her.   Day 3 ampicillin + ceftriaxone  Physical Exam: Constitutional:  Vitals:   01/26/21 0440 01/26/21 0548  BP: (!) 131/48 (!) 132/45  Pulse: 63   Resp: 18   Temp: 98.4 F (36.9 C)   SpO2: 96%    patient appears in NAD Respiratory: Normal respiratory effort; CTA B Cardiovascular: RRR + murmur GI: soft, nt, nd  Review of Systems: Constitutional: negative for fevers and chills Gastrointestinal: negative for diarrhea  Lab Results  Component Value Date   WBC 5.3 01/26/2021   HGB 7.5 (L) 01/26/2021   HCT 24.9 (L) 01/26/2021   MCV 102.0 (H) 01/26/2021   PLT 88 (L) 01/26/2021    Lab Results  Component Value Date   CREATININE 1.14 (H) 01/26/2021   BUN 49 (H) 01/26/2021   NA 150 (H) 01/26/2021   K 3.7 01/26/2021   CL 121 (H) 01/26/2021   CO2 25 01/26/2021    Lab Results  Component Value Date   ALT 61 (H) 01/26/2021   AST 53 (H) 01/26/2021   ALKPHOS 145 (H) 01/26/2021     Microbiology: Recent Results (from the past 240 hour(s))  Resp Panel by RT-PCR (Flu A&B, Covid) Nasopharyngeal Swab     Status: None   Collection Time: 01/18/21  6:27 PM   Specimen: Nasopharyngeal Swab; Nasopharyngeal(NP) swabs in vial transport medium  Result Value Ref Range Status   SARS Coronavirus 2 by RT PCR NEGATIVE NEGATIVE Final    Comment: (NOTE) SARS-CoV-2 target nucleic acids are NOT DETECTED.  The SARS-CoV-2 RNA is generally detectable in upper respiratory specimens during the acute phase of infection. The lowest concentration of SARS-CoV-2 viral copies this assay can detect is 138 copies/mL. A negative result does not preclude SARS-Cov-2 infection and should not be used as the sole basis for treatment or other patient management decisions. A negative result may occur with  improper specimen  collection/handling, submission of specimen other than nasopharyngeal swab, presence of viral mutation(s) within the areas targeted by this assay, and inadequate number of viral copies(<138 copies/mL). A negative result must be combined with clinical observations, patient history, and epidemiological information. The expected result is Negative.  Fact Sheet for Patients:  EntrepreneurPulse.com.au  Fact Sheet for Healthcare Providers:  IncredibleEmployment.be  This test is no t yet approved or cleared by the [place] FDA and  has been authorized for detection and/or diagnosis of SARS-CoV-2 by FDA under an Emergency Use Authorization (EUA). This EUA will remain  in effect (meaning this test can be used) for the duration of the COVID-19 declaration under Section 564(b)(1) of the Act, 21 U.S.C.section 360bbb-3(b)(1), unless the authorization is terminated  or revoked sooner.       Influenza A by PCR NEGATIVE NEGATIVE Final   Influenza B by PCR NEGATIVE NEGATIVE Final    Comment: (NOTE) The Xpert Xpress SARS-CoV-2/FLU/RSV plus assay is intended as an aid in the diagnosis of influenza from Nasopharyngeal swab specimens and should not be used as a sole basis for treatment. Nasal washings and aspirates are unacceptable for Xpert Xpress SARS-CoV-2/FLU/RSV testing.  Fact Sheet for Patients: EntrepreneurPulse.com.au  Fact Sheet for Healthcare Providers: IncredibleEmployment.be  This test is not yet approved or cleared by the [place] FDA and has been authorized for detection and/or diagnosis of SARS-CoV-2 by FDA under an  Emergency Use Authorization (EUA). This EUA will remain in effect (meaning this test can be used) for the duration of the COVID-19 declaration under Section 564(b)(1) of the Act, 21 U.S.C. section 360bbb-3(b)(1), unless the authorization is terminated or revoked.  Performed at [facility], [place] [address] [place], [place] [address]   Urine Culture     Status: Abnormal   Collection Time: 01/18/21 11:10 PM   Specimen: Urine, Clean Catch  Result Value Ref Range Status   Specimen Description URINE, CLEAN CATCH  Final   Special Requests   Final    NONE Performed at [facility], [place] [address] [place], [place] [address]    Culture 80,000 COLONIES/mL PSEUDOMONAS AERUGINOSA (A)  Final   Report Status 01/21/2021 FINAL  Final   Organism ID, Bacteria PSEUDOMONAS AERUGINOSA (A)  Final      Susceptibility   Pseudomonas aeruginosa - MIC*    CEFTAZIDIME 4 SENSITIVE Sensitive     CIPROFLOXACIN <=0.25 SENSITIVE Sensitive     GENTAMICIN 2 SENSITIVE Sensitive     IMIPENEM 2 SENSITIVE Sensitive     PIP/TAZO 8 SENSITIVE Sensitive     CEFEPIME 2 SENSITIVE Sensitive     * 80,000 COLONIES/mL PSEUDOMONAS AERUGINOSA  Culture, blood (Routine X 2) w Reflex to ID Panel     Status: Abnormal   Collection Time: 01/23/21  6:09 PM   Specimen: BLOOD  Result Value Ref Range Status   Specimen Description BLOOD RIGHT ANTECUBITAL  Final   Special Requests   Final    BOTTLES DRAWN AEROBIC AND ANAEROBIC Blood Culture adequate volume   Culture  Setup Time   Final    GRAM POSITIVE COCCI IN CHAINS IN BOTH AEROBIC AND ANAEROBIC BOTTLES Organism ID to follow CRITICAL RESULT CALLED TO, READ BACK BY AND VERIFIED WITH: Ronald Pippins [id number] MLM Performed at [facility], [place] [address], [place], [place] [address]    Culture ENTEROCOCCUS FAECALIS (A)  Final   Report Status 01/26/2021 FINAL  Final   Organism ID, Bacteria ENTEROCOCCUS FAECALIS  Final      Susceptibility   Enterococcus faecalis - MIC*    AMPICILLIN <=2 SENSITIVE Sensitive     VANCOMYCIN 1 SENSITIVE Sensitive     GENTAMICIN SYNERGY SENSITIVE Sensitive     * ENTEROCOCCUS FAECALIS  Culture, blood (Routine X 2) w Reflex to ID Panel     Status: Abnormal   Collection Time: 01/23/21  6:09 PM   Specimen: BLOOD  RIGHT HAND  Result Value Ref Range Status   Specimen Description BLOOD RIGHT HAND  Final   Special Requests   Final    BOTTLES DRAWN AEROBIC AND ANAEROBIC Blood Culture adequate volume   Culture  Setup Time   Final    GRAM POSITIVE COCCI IN CHAINS IN BOTH AEROBIC AND ANAEROBIC BOTTLES CRITICAL VALUE NOTED.  VALUE IS CONSISTENT WITH PREVIOUSLY REPORTED AND CALLED VALUE.    Culture (A)  Final    ENTEROCOCCUS FAECALIS SUSCEPTIBILITIES PERFORMED ON PREVIOUS CULTURE WITHIN THE LAST 5 DAYS. Performed at [facility], [place] [address], [place], [place] [address]    Report Status 01/26/2021 FINAL  Final  Blood Culture ID Panel (Reflexed)     Status: Abnormal   Collection Time: 01/23/21  6:09 PM  Result Value Ref Range Status   Enterococcus faecalis DETECTED (A) NOT DETECTED Final    Comment: CRITICAL RESULT CALLED TO, READ BACK BY AND VERIFIED WITH: [place] Mervyn Gay [id number]  MLM    Enterococcus Faecium NOT DETECTED NOT DETECTED Final   Listeria monocytogenes NOT DETECTED NOT DETECTED Final   Staphylococcus species NOT DETECTED NOT DETECTED Final   Staphylococcus aureus (BCID) NOT DETECTED NOT DETECTED Final   Staphylococcus epidermidis NOT DETECTED NOT DETECTED Final   Staphylococcus lugdunensis NOT DETECTED NOT DETECTED Final   Streptococcus species NOT DETECTED NOT DETECTED Final   Streptococcus agalactiae NOT DETECTED NOT DETECTED Final   Streptococcus pneumoniae NOT DETECTED NOT DETECTED Final   Streptococcus pyogenes NOT DETECTED NOT DETECTED Final   A.calcoaceticus-baumannii NOT DETECTED NOT DETECTED Final   Bacteroides fragilis NOT DETECTED NOT DETECTED Final   Enterobacterales NOT DETECTED NOT DETECTED Final   Enterobacter cloacae complex NOT DETECTED NOT DETECTED Final   Escherichia coli NOT DETECTED NOT DETECTED Final   Klebsiella aerogenes NOT DETECTED NOT DETECTED Final   Klebsiella oxytoca NOT DETECTED NOT DETECTED Final   Klebsiella pneumoniae NOT DETECTED NOT DETECTED  Final   Proteus species NOT DETECTED NOT DETECTED Final   Salmonella species NOT DETECTED NOT DETECTED Final   Serratia marcescens NOT DETECTED NOT DETECTED Final   Haemophilus influenzae NOT DETECTED NOT DETECTED Final   Neisseria meningitidis NOT DETECTED NOT DETECTED Final   Pseudomonas aeruginosa NOT DETECTED NOT DETECTED Final   Stenotrophomonas maltophilia NOT DETECTED NOT DETECTED Final   Candida albicans NOT DETECTED NOT DETECTED Final   Candida auris NOT DETECTED NOT DETECTED Final   Candida glabrata NOT DETECTED NOT DETECTED Final   Candida krusei NOT DETECTED NOT DETECTED Final   Candida parapsilosis NOT DETECTED NOT DETECTED Final   Candida tropicalis NOT DETECTED NOT DETECTED Final   Cryptococcus neoformans/gattii NOT DETECTED NOT DETECTED Final   Vancomycin resistance NOT DETECTED NOT DETECTED Final    Comment: Performed at [facility], [address] [place], [place] [address]  Culture, blood (routine x 2)     Status: None (Preliminary result)   Collection Time: 01/25/21 11:21 AM   Specimen: BLOOD RIGHT HAND  Result Value Ref Range Status   Specimen Description BLOOD RIGHT HAND  Final   Special Requests   Final    BOTTLES DRAWN AEROBIC AND ANAEROBIC Blood Culture adequate volume   Culture   Final    NO GROWTH < 24 HOURS Performed at [facility], [place] [address], [place], [place] [address]    Report Status PENDING  Incomplete  Culture, blood (routine x 2)     Status: None (Preliminary result)   Collection Time: 01/25/21 11:41 AM   Specimen: BLOOD RIGHT HAND  Result Value Ref Range Status   Specimen Description BLOOD RIGHT HAND  Final   Special Requests   Final    BOTTLES DRAWN AEROBIC ONLY Blood Culture adequate volume   Culture   Final    NO GROWTH < 24 HOURS Performed at [facility], [place] [address], [place], [place] [address]    Report Status PENDING  Incomplete    Impression/Plan:  1. Infective endocarditis of the mitral valve -  Enterococcal bacteremia c/w endocarditis with TTE findings.  On treatment as above with antibiotics and will need a prolonged 6 week course of antibiotics.  2.  medication monitoring - will continue to monitor LFTs and creat on antibiotics.  Stable today.

## 2021-01-26 NOTE — Progress Notes (Signed)
Daily Progress Note   Patient Name: Carrie Monroe       Date: 01/26/2021 DOB: 1935/07/19  Age: 85 y.o. MRN#: [id number] Attending Physician: Darliss Cheney, MD Primary Care Physician: Ginger Organ., MD Admit Date: 01/18/2021  Reason for Consultation/Follow-up: Establishing goals of care  Subjective: Medical records reviewed. Discussed with RN Carrie Monroe and assessed patient at the bedside. She is lethargic, appears uncomfortable and shakes her head to confirm but does not specify further. States "ok" when informed that this PA will contact her family to provide support.  I then called patient's daughter Carrie Monroe to provide updates, support, and discuss goals of care. I introduced Palliative Medicine as specialized medical care for people living with serious illness. It focuses on providing relief from the symptoms and stress of a serious illness. The goal is to improve quality of life for both the patient and the family.  Carrie Monroe shares that the patient lost her husband of 70 years around 3 years ago and has lived at home along since then, until her recent admission to SNF. Patient is a very religious woman and has enjoyed socializing with fellow church members, going out to lunches. She has always been someone who does not wish to be a burden on others and values her independence. For example, patient has stated that she would not want to be in a nursing home. We discussed [name]'s thoughts on how this might relate to existential distress since patient's husband died. Carrie Monroe feels it would be beneficial to accept SNF as a short-term option to "do whatever is helpful" and possibly allow patient to return home after recovery.   We then discussed patient's current illness, including lab trends since  initiation of antibiotics for endocarditis. I shared my concern that her mother is becoming more lethargic and continues to have poor oral intake despite ongoing treatments. Discussed anticipatory care needs and counseled on patient's risk to decline with additional complications throughout prolonged course of antibiotics. [name] understands the severity of patient's illness and that she may not do well despite treatment. She agrees with the importance of ongoing discussions with the care team and family. I emphasized the importance of planning for all possible outcomes and with her permission I reviewed the option of comfort care, hospice at home, and residential hospice. Carrie Monroe finds this very  helpful and voices her appreciation.  Questions and concerns addressed. Family was encouraged to call with additional needs. PMT will continue to support holistically.      Length of Stay: 7  Current Medications: Scheduled Meds:   sodium chloride   Intravenous Once   amiodarone  200 mg Oral Daily   apixaban  5 mg Oral BID   Chlorhexidine Gluconate Cloth  6 each Topical Daily   ezetimibe  10 mg Oral Daily   feeding supplement  237 mL Oral TID BM   ferrous sulfate  325 mg Oral Daily   hydrALAZINE  10 mg Oral Q8H   isosorbide mononitrate  15 mg Oral Daily   multivitamin with minerals  1 tablet Oral Daily   tamsulosin  0.4 mg Oral QPC breakfast   vitamin B-12  500 mcg Oral Daily    Continuous Infusions:  ampicillin (OMNIPEN) IV 2 g (01/26/21 1229)   cefTRIAXone (ROCEPHIN)  IV 2 g (01/26/21 0943)    PRN Meds: morphine injection  Physical Exam Vitals and nursing note reviewed.  Constitutional:      Appearance: She is ill-appearing.     Comments: Elderly, frail  Cardiovascular:     Rate and Rhythm: Normal rate.  Pulmonary:     Effort: Pulmonary effort is normal.  Neurological:     Mental Status: She is easily aroused. She is lethargic.            Vital Signs: BP (!) 132/45   Pulse 63    Temp 98.4 F (36.9 C) (Axillary)   Resp 18   Ht 5\' 2"  (1.575 m)   Wt 77.2 kg   SpO2 96%   BMI 31.13 kg/m  SpO2: SpO2: 96 % O2 Device: O2 Device: Room Air O2 Flow Rate:    Intake/output summary:  Intake/Output Summary (Last 24 hours) at 01/26/2021 1245 Last data filed at 01/26/2021 1138 Gross per 24 hour  Intake 1095.58 ml  Output 1300 ml  Net -204.42 ml   LBM: Last BM Date: 01/21/21 Baseline Weight: Weight: 77.4 kg Most recent weight: Weight: 77.2 kg       Palliative Assessment/Data: 40% at best      Patient Active Problem List   Diagnosis Date Noted   Subacute bacterial endocarditis    Cirrhosis of liver without ascites (HCC)    Obstructive uropathy    ARF (acute renal failure) ([place]) 01/18/2021   Acute encephalopathy 01/18/2021   Paroxysmal atrial fibrillation (HCC)    Tachycardia-bradycardia syndrome (HCC)    LBBB (left bundle branch block)    Volume overload 01/02/2021   Dizziness 07/14/2020   Biventricular failure ([place]) 07/14/2020   Pressure injury of skin 03/03/2020   Acute on chronic combined systolic and diastolic CHF (congestive heart failure) ([place]) 03/02/2020   AKI (acute kidney injury) ([place]) 03/02/2020   Thrombocytopenia ([place]) 03/02/2020   Secondary hypercoagulable state ([place]) 01/13/2020   Persistent atrial fibrillation (HCC)    Hypoxia    Acute on chronic diastolic CHF (congestive heart failure), NYHA class 3 ([place]) 11/21/2019   Atrial fibrillation with rapid ventricular response ([place]) 11/21/2019   Fever    Carotid stenosis 03/13/2019   Goals of care, counseling/discussion 11/27/2017   PICC (peripherally inserted central catheter) in place 11/27/2017   MSSA bacteremia 11/05/2017   Wound infection after surgery 11/04/2017   Anemia 09/27/2017   Hyperlipidemia    History of kidney stones    Hemorrhoids    Exogenous obesity    Chronic diastolic CHF (congestive heart  failure) (HCC)    Arthritis    Aortic stenosis, severe    Chronic respiratory  failure with hypoxia (HCC)    Hemorrhagic shock (HCC)    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   Carotid artery stenosis    Three-vessel CAD-PCI to LAD and LCx, med management of RCA    Chronic renal insufficiency, stage III (moderate) ([place]) 12/22/2016   Dental abscess- s/p multiple tooth extraction 12/21/16 12/22/2016   CAD- severe 3V CAD     Essential hypertension 01/27/2016   Morbid obesity due to excess calories ([place]) 01/27/2016   Gout 06/22/2013   Dyslipidemia 05/08/2011   Severe aortic stenosis 05/08/2011   Breast cancer of upper-outer quadrant of left female breast ([place]) 03/16/2011    Palliative Care Assessment & Plan   Patient Profile: 85 y.o. female  with past medical history of CHF, CAD status post stenting, A. fib tachybradycardia syndrome, diabetes mellitus, chronic kidney disease and anemia admitted on 01/18/2021 with elevated creatinine from [facility] SNF.   Patient was recently admitted for CHF exacerbation after several weeks of worsening symptoms. CT now shows prominent distention of the bladder with bilateral hydronephrosis and hydroureter, changes of hepatic cirrhosis with prominent splenic vein varices and splenomegaly.    Patient also has Enterococcus bacteremia/bacterial endocarditis and multiple comorbidities. Palliative medicine has been consulted to assist with goals of care conversation.     Assessment: Acute renal failure on OLM7E Acute metabolic encephalopathy  Enterococcus bacteremia/bacterial endocarditis UTI Goals of care conversation  Recommendations/Plan: Continue current interventions/therapies, watchful waiting to determine whether patient responds to antibiotics Daughter Carrie Monroe confirms that she is the family spokesperson to contact with updates Psychosocial and emotional support provided  Goals of Care and Additional Recommendations: Limitations on Scope of Treatment: Full Scope Treatment  Prognosis:   Guarded  Discharge Planning: [facility] for rehab with Palliative care service follow-up  Care plan was discussed with patient's daughter Carrie Monroe, Dr. Doristine Bosworth  Total time: 40 minutes Greater than 50% of this time was spent in counseling and coordinating care related to the above assessment and plan.  Dorthy Cooler, PA-C Palliative Medicine Team Team phone # [phone]  Thank you for allowing the Palliative Medicine Team to assist in the care of this patient. Please utilize secure chat with additional questions, if there is no response within 30 minutes please call the above phone number.  Palliative Medicine Team providers are available by phone from 7am to 7pm daily and can be reached through the team cell phone.  Should this patient require assistance outside of these hours, please call the patient's attending physician.

## 2021-01-26 NOTE — Progress Notes (Addendum)
PROGRESS NOTE    Carrie Monroe  QMG:[id number] DOB: 1935-12-29 DOA: 01/18/2021 PCP: Ginger Organ., MD    Brief Narrative:  85 year old female with history of congestive heart failure, coronary artery disease status post stenting, atrial fibrillation tachybradycardia syndrome, diabetes, chronic kidney disease and anemia recently admitted to the hospital with weakness and pseudogout of the knee was discharged to a skilled nursing rehab 9 days ago brought back to the emergency room with elevated creatinine on routine exam at the skilled nursing facility.  Patient is poor historian.  Patient's daughter tells me that she had been looking weak and tired however she has not noticed anything new.  In the emergency room CT scan abdomen pelvis showed prominent distention of the bladder with bilateral hydronephrosis and hydroureter, changes of hepatic cirrhosis with prominent splenic vein varices and splenomegaly.  Creatinine 2.3 and BUN 137.  A Foley catheter was placed.  Admitted for further work-up.  Recent hospitalization 01/02/2021--01/09/2021 with severe fatigue generalized weakness-found to have acute superimposed on chronic diastolic heart failure as well as decompensated pulmonary hypertension--at that admission palliative care consulted  Assessment & Plan:   Principal Problem:   ARF (acute renal failure) ([place]) Active Problems:   Essential hypertension   CAD- severe 3V CAD    Three-vessel CAD-PCI to LAD and LCx, med management of RCA   S/P TAVR (transcatheter aortic valve replacement)   Aortic stenosis, severe   Goals of care, counseling/discussion   Persistent atrial fibrillation (HCC)   AKI (acute kidney injury) ([place])   Acute encephalopathy   Subacute bacterial endocarditis   Cirrhosis of liver without ascites ([place])   Obstructive uropathy  Acute kidney injury on chronic kidney disease stage 3a: Baseline creatinine about 1.5.  Presented with creatinine of 2.35.  CT abdomen at  admission showed prominent urinary bladder with bilateral hydronephrosis and hydroureter, probably postobstructive and dehydration.  Renal function now at baseline now.  Continue and Discharge with Foley catheter until patient has good clinical improvement of her mobility.  Continue Flomax.  Acute metabolic encephalopathy in a patient with multiple medical problems: CT head unremarkable.  Likely metabolic from uremia and UTI.  Urea 135 on presentation.  Trending down.  Ammonia is normal. B 12 and folic acid is normal.  Shin remains intermittently lethargic.  Enterococcus bacteremia/bacterial endocarditis: Transthoracic echo shows large mitral valve vegetation/endocarditis.  Blood cultures are now growing Enterococcus faecalis.  ID consulted patient has been started on Rocephin and ampicillin.  We have consulted ID.  Patient seen by CT surgery.  Due to multiple comorbidities, she is deemed to be a poor surgical candidate so CT surgery did not recommend any surgery.  ID recommends 6 weeks of IV antibiotics.  Palliative care on board leading the communication with the family.  Family is open to considering comfort care if patient were to go [place] however at this point in time, they would like to continue antibiotics and pursue rehab if possible.  UTI: Urinalysis with more than 80,000 Pseudomonas, received cefepime for 5 days, ended on 01/22/2021.  Elevated LFTs with CT scan concerning for hepatic cirrhosis: Hepatitis panel negative.  Mild abnormal LFTs, cardiology discontinued statin and started her on Zetia.  Patient also on amiodarone.  Per cardiology, this needs to be continued as she does not have any other options for her atrial fibrillation.  LFTs improving.  Prediabetes: Recent hemoglobin A1c about a year ago was 6.3.  Currently hyperglycemic.  We will check hemoglobin A1c again and start on  SSI.  Permanent A. fib on amiodarone and Eliquis: Continue amiodarone.  Therapeutic on Eliquis.  Repeat EKG  consistent with chronic left bundle branch block pattern.  Cardiology on board and managing.  Appreciate their help.  Chronic combined heart failure: Without exacerbation.  Euvolemic other than some bilateral lower extremity edema but no pulmonary edema.  Received IV fluids.  Cardiology was consulted for this.  She received 1 dose of Lasix on 01/22/2021.  Renal function is stable.  No further diuretics planned by cardiology.  Appreciate their help.  Essential hypertension: Blood pressure controlled.  Continue hydralazine and Imdur.  Hyperlipidemia: Crestor was switched to Zetia by cardiology on 01/23/2021 due to elevated LFTs and the need for continuation of amiodarone in order to protect liver  Debility: PT OT recommends SNF.  TOC on board for placement   DVT prophylaxis:  apixaban (ELIQUIS) tablet 5 mg   Code Status: DNR Family Communication: None at bedside.  Discussed with D IL and daughter yesterday.  Palliative care leading communications with the family. Disposition Plan: Status is: Inpatient.   Dispo: The patient is from: SNF              Anticipated d/c is to: SNF              Patient currently is not medically stable.   Difficult to place patient No    Consultants:  None  Procedures:  None  Antimicrobials:  Rocephin 8/24--- 8/25 Cefepime 8/26--01/22/2021 Rocephin 01/24/2021> Ampicillin 01/24/2021>   Subjective: Patient seen and examined.  Once again lethargic.  He says she was not feeling well but did not, with any specific complaints.  Objective: Vitals:   01/25/21 1512 01/25/21 1944 01/26/21 0440 01/26/21 0548  BP: (!) 125/46 (!) 134/49 (!) 131/48 (!) 132/45  Pulse: 69 72 63   Resp: 20 19 18    Temp: 97.9 F (36.6 C) 97.9 F (36.6 C) 98.4 F (36.9 C)   TempSrc: Oral Oral Axillary   SpO2: 99% 100% 96%   Weight:      Height:        Intake/Output Summary (Last 24 hours) at 01/26/2021 1251 Last data filed at 01/26/2021 1138 Gross per 24 hour  Intake 1095.58  ml  Output 1300 ml  Net -204.42 ml    Filed Weights   01/18/21 1747 01/23/21 1239  Weight: 77.4 kg 77.2 kg    Examination: General exam: Appears calm and comfortable  Respiratory system: Clear to auscultation. Respiratory effort normal. Cardiovascular system: S1 & S2 heard, RRR. No JVD, murmurs, rubs, gallops or clicks. No pedal edema. Gastrointestinal system: Abdomen is nondistended, soft and nontender. No organomegaly or masses felt. Normal bowel sounds heard. Central nervous system: Alert and oriented. No focal neurological deficits. Extremities: Symmetric 5 x 5 power. Skin: No rashes, lesions or ulcers.   Data Reviewed: I have personally reviewed following labs and imaging studies  CBC: Recent Labs  Lab 01/22/21 0515 01/23/21 4401 01/23/21 1519 01/24/21 0205 01/25/21 0044 01/26/21 0307  WBC 5.7 6.1  --  6.4 6.9 5.3  NEUTROABS 4.5 4.8  --  5.2 5.7 4.2  HGB 7.2* 8.9* 8.1* 8.1* 8.4* 7.5*  HCT 24.3* 28.4* 25.9* 26.4* 27.9* 24.9*  MCV 101.3* 99.0  --  99.2 101.5* 102.0*  PLT 95* 94*  --  89* 87* 88*    Basic Metabolic Panel: Recent Labs  Lab 01/20/21 0139 01/21/21 0314 01/22/21 0515 01/23/21 0272 01/24/21 0205 01/25/21 0044 01/26/21 0307  NA 146* 147* 144 145  146* 149* 150*  K 3.4* 3.8 3.8 3.6 3.6 3.8 3.7  CL 118* 119* 118* 120* 120* 122* 121*  CO2 24 24 22  21* 23 23 25   GLUCOSE 163* 146* 130* 120* 131* 165* 191*  BUN 98* 78* 56* 52* 50* 50* 49*  CREATININE 1.72* 1.39* 1.16* 1.24* 1.23* 1.31* 1.14*  CALCIUM 8.9 8.9 8.9 8.9 8.9 9.1 8.9  MG 2.4 2.2  --   --   --   --   --   PHOS 3.3 2.4*  --   --   --   --   --     GFR: Estimated Creatinine Clearance: 36.6 mL/min (A) (by C-G formula based on SCr of 1.14 mg/dL (H)). Liver Function Tests: Recent Labs  Lab 01/22/21 0515 01/23/21 5102 01/24/21 0205 01/25/21 0044 01/26/21 0307  AST 91* 80* 62* 60* 53*  ALT 102* 95* 80* 79* 61*  ALKPHOS 106 118 111 136* 145*  BILITOT 0.6 1.1 0.8 0.4 <0.1*  PROT 4.6*  4.7* 4.6* 4.9* 4.5*  ALBUMIN 1.5* 1.5* 1.4* 1.5* 1.4*    No results for input(s): LIPASE, AMYLASE in the last 168 hours.  No results for input(s): AMMONIA in the last 168 hours.  Coagulation Profile: Recent Labs  Lab 01/23/21 0638  INR 1.4*    Cardiac Enzymes: No results for input(s): CKTOTAL, CKMB, CKMBINDEX, TROPONINI in the last 168 hours. BNP (last 3 results) No results for input(s): PROBNP in the last 8760 hours. HbA1C: No results for input(s): HGBA1C in the last 72 hours. CBG: Recent Labs  Lab 01/25/21 1228 01/25/21 1643 01/25/21 2013 01/26/21 0801 01/26/21 1134  GLUCAP 200* 197* 162* 183* 235*    Lipid Profile: No results for input(s): CHOL, HDL, LDLCALC, TRIG, CHOLHDL, LDLDIRECT in the last 72 hours. Thyroid Function Tests: No results for input(s): TSH, T4TOTAL, FREET4, T3FREE, THYROIDAB in the last 72 hours. Anemia Panel: No results for input(s): VITAMINB12, FOLATE, FERRITIN, TIBC, IRON, RETICCTPCT in the last 72 hours.  Sepsis Labs: No results for input(s): PROCALCITON, LATICACIDVEN in the last 168 hours.  Recent Results (from the past 240 hour(s))  Resp Panel by RT-PCR (Flu A&B, Covid) Nasopharyngeal Swab     Status: None   Collection Time: 01/18/21  6:27 PM   Specimen: Nasopharyngeal Swab; Nasopharyngeal(NP) swabs in vial transport medium  Result Value Ref Range Status   SARS Coronavirus 2 by RT PCR NEGATIVE NEGATIVE Final    Comment: (NOTE) SARS-CoV-2 target nucleic acids are NOT DETECTED.  The SARS-CoV-2 RNA is generally detectable in upper respiratory specimens during the acute phase of infection. The lowest concentration of SARS-CoV-2 viral copies this assay can detect is 138 copies/mL. A negative result does not preclude SARS-Cov-2 infection and should not be used as the sole basis for treatment or other patient management decisions. A negative result may occur with  improper specimen collection/handling, submission of specimen other than  nasopharyngeal swab, presence of viral mutation(s) within the areas targeted by this assay, and inadequate number of viral copies(<138 copies/mL). A negative result must be combined with clinical observations, patient history, and epidemiological information. The expected result is Negative.  Fact Sheet for Patients:  EntrepreneurPulse.com.au  Fact Sheet for Healthcare Providers:  IncredibleEmployment.be  This test is no t yet approved or cleared by the [place] FDA and  has been authorized for detection and/or diagnosis of SARS-CoV-2 by FDA under an Emergency Use Authorization (EUA). This EUA will remain  in effect (meaning this test can be used) for the  duration of the COVID-19 declaration under Section 564(b)(1) of the Act, 21 U.S.C.section 360bbb-3(b)(1), unless the authorization is terminated  or revoked sooner.       Influenza A by PCR NEGATIVE NEGATIVE Final   Influenza B by PCR NEGATIVE NEGATIVE Final    Comment: (NOTE) The Xpert Xpress SARS-CoV-2/FLU/RSV plus assay is intended as an aid in the diagnosis of influenza from Nasopharyngeal swab specimens and should not be used as a sole basis for treatment. Nasal washings and aspirates are unacceptable for Xpert Xpress SARS-CoV-2/FLU/RSV testing.  Fact Sheet for Patients: EntrepreneurPulse.com.au  Fact Sheet for Healthcare Providers: IncredibleEmployment.be  This test is not yet approved or cleared by the [place] FDA and has been authorized for detection and/or diagnosis of SARS-CoV-2 by FDA under an Emergency Use Authorization (EUA). This EUA will remain in effect (meaning this test can be used) for the duration of the COVID-19 declaration under Section 564(b)(1) of the Act, 21 U.S.C. section 360bbb-3(b)(1), unless the authorization is terminated or revoked.  Performed at [facility], [place] [address] [place], [place] [address]    Urine Culture     Status: Abnormal   Collection Time: 01/18/21 11:10 PM   Specimen: Urine, Clean Catch  Result Value Ref Range Status   Specimen Description URINE, CLEAN CATCH  Final   Special Requests   Final    NONE Performed at [facility], [place] [address], [place], [place] [address]    Culture 80,000 COLONIES/mL PSEUDOMONAS AERUGINOSA (A)  Final   Report Status 01/21/2021 FINAL  Final   Organism ID, Bacteria PSEUDOMONAS AERUGINOSA (A)  Final      Susceptibility   Pseudomonas aeruginosa - MIC*    CEFTAZIDIME 4 SENSITIVE Sensitive     CIPROFLOXACIN <=0.25 SENSITIVE Sensitive     GENTAMICIN 2 SENSITIVE Sensitive     IMIPENEM 2 SENSITIVE Sensitive     PIP/TAZO 8 SENSITIVE Sensitive     CEFEPIME 2 SENSITIVE Sensitive     * 80,000 COLONIES/mL PSEUDOMONAS AERUGINOSA  Culture, blood (Routine X 2) w Reflex to ID Panel     Status: Abnormal   Collection Time: 01/23/21  6:09 PM   Specimen: BLOOD  Result Value Ref Range Status   Specimen Description BLOOD RIGHT ANTECUBITAL  Final   Special Requests   Final    BOTTLES DRAWN AEROBIC AND ANAEROBIC Blood Culture adequate volume   Culture  Setup Time   Final    GRAM POSITIVE COCCI IN CHAINS IN BOTH AEROBIC AND ANAEROBIC BOTTLES Organism ID to follow CRITICAL RESULT CALLED TO, READ BACK BY AND VERIFIED WITH: Ronald Pippins [phone] Performed at [facility], [place] [address], [place],  [address]    Culture ENTEROCOCCUS FAECALIS (A)  Final   Report Status 01/26/2021 FINAL  Final   Organism ID, Bacteria ENTEROCOCCUS FAECALIS  Final      Susceptibility   Enterococcus faecalis - MIC*    AMPICILLIN <=2 SENSITIVE Sensitive     VANCOMYCIN 1 SENSITIVE Sensitive     GENTAMICIN SYNERGY SENSITIVE Sensitive     * ENTEROCOCCUS FAECALIS  Culture, blood (Routine X 2) w Reflex to ID Panel     Status: Abnormal   Collection Time: 01/23/21  6:09 PM   Specimen: BLOOD RIGHT HAND  Result Value Ref Range Status   Specimen  Description BLOOD RIGHT HAND  Final   Special Requests   Final    BOTTLES DRAWN AEROBIC AND ANAEROBIC Blood Culture adequate volume  Culture  Setup Time   Final    GRAM POSITIVE COCCI IN CHAINS IN BOTH AEROBIC AND ANAEROBIC BOTTLES CRITICAL VALUE NOTED.  VALUE IS CONSISTENT WITH PREVIOUSLY REPORTED AND CALLED VALUE.    Culture (A)  Final    ENTEROCOCCUS FAECALIS SUSCEPTIBILITIES PERFORMED ON PREVIOUS CULTURE WITHIN THE LAST 5 DAYS. Performed at [facility], [place] [address] [place], [place] [address]    Report Status 01/26/2021 FINAL  Final  Blood Culture ID Panel (Reflexed)     Status: Abnormal   Collection Time: 01/23/21  6:09 PM  Result Value Ref Range Status   Enterococcus faecalis DETECTED (A) NOT DETECTED Final    Comment: CRITICAL RESULT CALLED TO, READ BACK BY AND VERIFIED WITH: Ronald Pippins [id number] MLM    Enterococcus Faecium NOT DETECTED NOT DETECTED Final   Listeria monocytogenes NOT DETECTED NOT DETECTED Final   Staphylococcus species NOT DETECTED NOT DETECTED Final   Staphylococcus aureus (BCID) NOT DETECTED NOT DETECTED Final   Staphylococcus epidermidis NOT DETECTED NOT DETECTED Final   Staphylococcus lugdunensis NOT DETECTED NOT DETECTED Final   Streptococcus species NOT DETECTED NOT DETECTED Final   Streptococcus agalactiae NOT DETECTED NOT DETECTED Final   Streptococcus pneumoniae NOT DETECTED NOT DETECTED Final   Streptococcus pyogenes NOT DETECTED NOT DETECTED Final   A.calcoaceticus-baumannii NOT DETECTED NOT DETECTED Final   Bacteroides fragilis NOT DETECTED NOT DETECTED Final   Enterobacterales NOT DETECTED NOT DETECTED Final   Enterobacter cloacae complex NOT DETECTED NOT DETECTED Final   Escherichia coli NOT DETECTED NOT DETECTED Final   Klebsiella aerogenes NOT DETECTED NOT DETECTED Final   Klebsiella oxytoca NOT DETECTED NOT DETECTED Final   Klebsiella pneumoniae NOT DETECTED NOT DETECTED Final   Proteus species NOT DETECTED NOT DETECTED  Final   Salmonella species NOT DETECTED NOT DETECTED Final   Serratia marcescens NOT DETECTED NOT DETECTED Final   Haemophilus influenzae NOT DETECTED NOT DETECTED Final   Neisseria meningitidis NOT DETECTED NOT DETECTED Final   Pseudomonas aeruginosa NOT DETECTED NOT DETECTED Final   Stenotrophomonas maltophilia NOT DETECTED NOT DETECTED Final   Candida albicans NOT DETECTED NOT DETECTED Final   Candida auris NOT DETECTED NOT DETECTED Final   Candida glabrata NOT DETECTED NOT DETECTED Final   Candida krusei NOT DETECTED NOT DETECTED Final   Candida parapsilosis NOT DETECTED NOT DETECTED Final   Candida tropicalis NOT DETECTED NOT DETECTED Final   Cryptococcus neoformans/gattii NOT DETECTED NOT DETECTED Final   Vancomycin resistance NOT DETECTED NOT DETECTED Final    Comment: Performed at [facility], [address], [place], [place] [address]  Culture, blood (routine x 2)     Status: None (Preliminary result)   Collection Time: 01/25/21 11:21 AM   Specimen: BLOOD RIGHT HAND  Result Value Ref Range Status   Specimen Description BLOOD RIGHT HAND  Final   Special Requests   Final    BOTTLES DRAWN AEROBIC AND ANAEROBIC Blood Culture adequate volume   Culture   Final    NO GROWTH < 24 HOURS Performed at [facility], [place] [address] [place], [place] [address]    Report Status PENDING  Incomplete  Culture, blood (routine x 2)     Status: None (Preliminary result)   Collection Time: 01/25/21 11:41 AM   Specimen: BLOOD RIGHT HAND  Result Value Ref Range Status   Specimen Description BLOOD RIGHT HAND  Final   Special Requests   Final    BOTTLES DRAWN AEROBIC ONLY  Blood Culture adequate volume   Culture   Final    NO GROWTH < 24 HOURS Performed at [facility], [place] [address], [place], [place] [address]    Report Status PENDING  Incomplete    Radiology Studies: No results found.  Scheduled Meds:  sodium chloride   Intravenous Once   amiodarone  200 mg Oral  Daily   apixaban  5 mg Oral BID   Chlorhexidine Gluconate Cloth  6 each Topical Daily   ezetimibe  10 mg Oral Daily   feeding supplement  237 mL Oral TID BM   ferrous sulfate  325 mg Oral Daily   hydrALAZINE  10 mg Oral Q8H   isosorbide mononitrate  15 mg Oral Daily   multivitamin with minerals  1 tablet Oral Daily   tamsulosin  0.4 mg Oral QPC breakfast   vitamin B-12  500 mcg Oral Daily   Continuous Infusions:  ampicillin (OMNIPEN) IV 2 g (01/26/21 1229)   cefTRIAXone (ROCEPHIN)  IV 2 g (01/26/21 0943)     LOS: 7 days   Time spent: 28 minutes  Darliss Cheney, MD [facility]

## 2021-01-26 NOTE — Progress Notes (Signed)
  Speech Language Pathology Treatment: Dysphagia  Patient Details Name: Carrie Monroe MRN: [id number] DOB: 22-Dec-1935 Today's Date: 01/26/2021 Time: 9326-7124 SLP Time Calculation (min) (ACUTE ONLY): 23 min  Assessment / Plan / Recommendation Clinical Impression  Pt seen for dysphagia tx/diet tolerance with current diet of Dysphagia 1/thin liquids wit differential dx with progression of soft solids.  Pt with prolonged mastication, but no s/s of aspiration present throughout trial/snack.  Pt initially refusing PO intake stating "I am all therapied out", but with encouragement and given preferred liquid/food, pt agreeable to trialed amounts of soft solids/thin via straw.  Pt consumed successive swallows of thin via straw without overt s/s of aspiration present.  Pt with oral residue (? Meds) on tongue prior to PO intake with pt declining oral care.  Transition meds to whole/puree may be beneficial to reduce/eliminate oral residue.  Pt stating she "doesn't like food" and when further questioned, she stated it was d/t the consistency.  Soft solids with prolonged mastication, but adequate oral clearance and transitioned into pharynx without overt aspiration symptoms noted.  Recommend progressing to Dysphagia 2/thin liquid diet for increased satiety and PO intake improvement.  ST will continue to f/u for dysphagia management/tx.    HPI HPI: 85 y.o. female presents to [facility] from SNF on 01/18/2021 with weakness and pseudogout of knee along with elevated creatinine. CT abdomen/pelvis demonstrates bladder distention with bilateral hydronephrosis and hydroureter, changes of hepatic cirrhosis with prominent splenic vein varices and splenomegaly. Pt found to have UTI      SLP Plan  Goals updated       Recommendations  Diet recommendations: Dysphagia 2 (fine chop);Thin liquid Liquids provided via: Cup;Straw Medication Administration: Whole meds with puree Supervision: Staff to assist with self  feeding;Full supervision/cueing for compensatory strategies Compensations: Slow rate;Small sips/bites;Minimize environmental distractions Postural Changes and/or Swallow Maneuvers: Seated upright 90 degrees                Oral Care Recommendations: Oral care QID Follow up Recommendations: 24 hour supervision/assistance SLP Visit Diagnosis: Dysphagia, unspecified (R13.10) Plan: Goals updated                       Carrie Monroe, M.S., [place] 01/26/2021, 2:35 PM

## 2021-01-27 DIAGNOSIS — I330 Acute and subacute infective endocarditis: Secondary | ICD-10-CM | POA: Diagnosis not present

## 2021-01-27 LAB — CBC WITH DIFFERENTIAL/PLATELET
Abs Immature Granulocytes: 0.03 10*3/uL (ref 0.00–0.07)
Basophils Absolute: 0 10*3/uL (ref 0.0–0.1)
Basophils Relative: 0 %
Eosinophils Absolute: 0.2 10*3/uL (ref 0.0–0.5)
Eosinophils Relative: 3 %
HCT: 25.5 % — ABNORMAL LOW (ref 36.0–46.0)
Hemoglobin: 7.9 g/dL — ABNORMAL LOW (ref 12.0–15.0)
Immature Granulocytes: 1 %
Lymphocytes Relative: 11 %
Lymphs Abs: 0.7 10*3/uL (ref 0.7–4.0)
MCH: 31.2 pg (ref 26.0–34.0)
MCHC: 31 g/dL (ref 30.0–36.0)
MCV: 100.8 fL — ABNORMAL HIGH (ref 80.0–100.0)
Monocytes Absolute: 0.4 10*3/uL (ref 0.1–1.0)
Monocytes Relative: 6 %
Neutro Abs: 5.2 10*3/uL (ref 1.7–7.7)
Neutrophils Relative %: 79 %
Platelets: 87 10*3/uL — ABNORMAL LOW (ref 150–400)
RBC: 2.53 MIL/uL — ABNORMAL LOW (ref 3.87–5.11)
RDW: 16.6 % — ABNORMAL HIGH (ref 11.5–15.5)
WBC: 6.5 10*3/uL (ref 4.0–10.5)
nRBC: 0 % (ref 0.0–0.2)

## 2021-01-27 LAB — CULTURE, BLOOD (ROUTINE X 2): Special Requests: ADEQUATE

## 2021-01-27 LAB — GLUCOSE, CAPILLARY
Glucose-Capillary: 122 mg/dL — ABNORMAL HIGH (ref 70–99)
Glucose-Capillary: 165 mg/dL — ABNORMAL HIGH (ref 70–99)
Glucose-Capillary: 180 mg/dL — ABNORMAL HIGH (ref 70–99)
Glucose-Capillary: 158 mg/dL — ABNORMAL HIGH (ref 70–99)

## 2021-01-27 NOTE — Progress Notes (Signed)
PHARMACY CONSULT NOTE FOR:  OUTPATIENT  PARENTERAL ANTIBIOTIC THERAPY (OPAT)  Indication: E. Faecalis Endocarditis/Bacteremia Regimen:  Ampicillin 8g IV q24h administered as a continuous infusion  Ceftriaxone 2g IV q12h End date: 03/07/21  IV antibiotic discharge orders are pended. To discharging provider:  please sign these orders via discharge navigator,  Select New Orders & click on the button choice - Manage This Unsigned Work.     Thank you for allowing pharmacy to be a part of this patient's care.  Lestine Box, PharmD PGY2 Infectious Diseases Pharmacy Resident   Please check [email or web address] for unit-specific pharmacy phone numbers

## 2021-01-27 NOTE — TOC Progression Note (Signed)
Transition of Care [facility]) - Progression Note    Patient Details  Name: Carrie Monroe MRN: [id number] Date of Birth: 24-Nov-1935  [facility] of [facility]) CM/SW [place], [place] Phone Number: 01/27/2021, 1:34 PM  Clinical Narrative:      CSW following for dc plan. CSW will continue to follow and assist with dc planning needs.  Expected Discharge Plan: [place] Barriers to Discharge: Continued Medical Work up  Expected Discharge Plan and Services Expected Discharge Plan: [place] In-house Referral: Clinical Social Work     Living arrangements for the past 2 months: [place]                                       Social Determinants of Health (SDOH) Interventions    Readmission Risk Interventions Readmission Risk Prevention Plan 03/17/2020  Transportation Screening Complete  PCP or Specialist Appt within 3-5 Days Complete  HRI or [place] Complete  Social Work Consult for [place] Planning/Counseling Complete  Palliative Care Screening Not Applicable  Medication Review Press photographer) Complete  Some recent data might be hidden

## 2021-01-27 NOTE — Progress Notes (Signed)
[facility]ious [facility]   Reason for visit: Follow up on infective endocarditis  Interval History: WBC wnl, remains afebrile; no complaints today.  No associated rash or diarrhea. Day 4 ampicillin + ceftriaxone  Physical Exam: Constitutional:  Vitals:   01/27/21 0751 01/27/21 0931  BP: (!) 123/35 (!) 121/43  Pulse: 73 71  Resp: (!) 24 20  Temp:  98.7 F (37.1 C)  SpO2: 99% 96%   patient appears in NAD, fatigued Respiratory: Normal respiratory effort; CTA B Cardiovascular: RRR GI: soft, nt, nd  Review of Systems: Constitutional: negative for chills Gastrointestinal: negative for nausea and diarrhea  Lab Results  Component Value Date   WBC 6.5 01/27/2021   HGB 7.9 (L) 01/27/2021   HCT 25.5 (L) 01/27/2021   MCV 100.8 (H) 01/27/2021   PLT 87 (L) 01/27/2021    Lab Results  Component Value Date   CREATININE 1.14 (H) 01/26/2021   BUN 49 (H) 01/26/2021   NA 150 (H) 01/26/2021   K 3.7 01/26/2021   CL 121 (H) 01/26/2021   CO2 25 01/26/2021    Lab Results  Component Value Date   ALT 61 (H) 01/26/2021   AST 53 (H) 01/26/2021   ALKPHOS 145 (H) 01/26/2021     Microbiology: Recent Results (from the past 240 hour(s))  Resp Panel by RT-PCR (Flu A&B, Covid) Nasopharyngeal Swab     Status: None   Collection Time: 01/18/21  6:27 PM   Specimen: Nasopharyngeal Swab; Nasopharyngeal(NP) swabs in vial transport medium  Result Value Ref Range Status   SARS Coronavirus 2 by RT PCR NEGATIVE NEGATIVE Final    Comment: (NOTE) SARS-CoV-2 target nucleic acids are NOT DETECTED.  The SARS-CoV-2 RNA is generally detectable in upper respiratory specimens during the acute phase of infection. The lowest concentration of SARS-CoV-2 viral copies this assay can detect is 138 copies/mL. A negative result does not preclude SARS-Cov-2 infection and should not be used as the sole basis for treatment or other patient management decisions. A negative result may occur with   improper specimen collection/handling, submission of specimen other than nasopharyngeal swab, presence of viral mutation(s) within the areas targeted by this assay, and inadequate number of viral copies(<138 copies/mL). A negative result must be combined with clinical observations, patient history, and epidemiological information. The expected result is Negative.  Fact Sheet for Patients:  EntrepreneurPulse.com.au  Fact Sheet for Healthcare Providers:  IncredibleEmployment.be  This test is no t yet approved or cleared by the [place] FDA and  has been authorized for detection and/or diagnosis of SARS-CoV-2 by FDA under an Emergency Use Authorization (EUA). This EUA will remain  in effect (meaning this test can be used) for the duration of the COVID-19 declaration under Section 564(b)(1) of the Act, 21 U.S.C.section 360bbb-3(b)(1), unless the authorization is terminated  or revoked sooner.       Influenza A by PCR NEGATIVE NEGATIVE Final   Influenza B by PCR NEGATIVE NEGATIVE Final    Comment: (NOTE) The Xpert Xpress SARS-CoV-2/FLU/RSV plus assay is intended as an aid in the diagnosis of influenza from Nasopharyngeal swab specimens and should not be used as a sole basis for treatment. Nasal washings and aspirates are unacceptable for Xpert Xpress SARS-CoV-2/FLU/RSV testing.  Fact Sheet for Patients: EntrepreneurPulse.com.au  Fact Sheet for Healthcare Providers: IncredibleEmployment.be  This test is not yet approved or cleared by the [place] FDA and has been authorized for detection and/or diagnosis of SARS-CoV-2 by FDA under an Emergency Use  Authorization (EUA). This EUA will remain in effect (meaning this test can be used) for the duration of the COVID-19 declaration under Section 564(b)(1) of the Act, 21 U.S.C. section 360bbb-3(b)(1), unless the authorization is terminated  or revoked.  Performed at [facility], [place] [address], [place], [place] [address]   Urine Culture     Status: Abnormal   Collection Time: 01/18/21 11:10 PM   Specimen: Urine, Clean Catch  Result Value Ref Range Status   Specimen Description URINE, CLEAN CATCH  Final   Special Requests   Final    NONE Performed at [facility], [place] [address], [place], [place] [address]    Culture 80,000 COLONIES/mL PSEUDOMONAS AERUGINOSA (A)  Final   Report Status 01/21/2021 FINAL  Final   Organism ID, Bacteria PSEUDOMONAS AERUGINOSA (A)  Final      Susceptibility   Pseudomonas aeruginosa - MIC*    CEFTAZIDIME 4 SENSITIVE Sensitive     CIPROFLOXACIN <=0.25 SENSITIVE Sensitive     GENTAMICIN 2 SENSITIVE Sensitive     IMIPENEM 2 SENSITIVE Sensitive     PIP/TAZO 8 SENSITIVE Sensitive     CEFEPIME 2 SENSITIVE Sensitive     * 80,000 COLONIES/mL PSEUDOMONAS AERUGINOSA  Culture, blood (Routine X 2) w Reflex to ID Panel     Status: Abnormal   Collection Time: 01/23/21  6:09 PM   Specimen: BLOOD  Result Value Ref Range Status   Specimen Description BLOOD RIGHT ANTECUBITAL  Final   Special Requests   Final    BOTTLES DRAWN AEROBIC AND ANAEROBIC Blood Culture adequate volume   Culture  Setup Time   Final    GRAM POSITIVE COCCI IN CHAINS IN BOTH AEROBIC AND ANAEROBIC BOTTLES Organism ID to follow CRITICAL RESULT CALLED TO, READ BACK BY AND VERIFIED WITH: Ronald Pippins [id number] MLM Performed at [facility], [place] [address] [place], [place] [address]    Culture ENTEROCOCCUS FAECALIS (A)  Final   Report Status 01/26/2021 FINAL  Final   Organism ID, Bacteria ENTEROCOCCUS FAECALIS  Final      Susceptibility   Enterococcus faecalis - MIC*    AMPICILLIN <=2 SENSITIVE Sensitive     VANCOMYCIN 1 SENSITIVE Sensitive     GENTAMICIN SYNERGY SENSITIVE Sensitive     * ENTEROCOCCUS FAECALIS  Culture, blood (Routine X 2) w Reflex to ID Panel     Status: Abnormal   Collection Time:  01/23/21  6:09 PM   Specimen: BLOOD RIGHT HAND  Result Value Ref Range Status   Specimen Description BLOOD RIGHT HAND  Final   Special Requests   Final    BOTTLES DRAWN AEROBIC AND ANAEROBIC Blood Culture adequate volume   Culture  Setup Time   Final    GRAM POSITIVE COCCI IN CHAINS IN BOTH AEROBIC AND ANAEROBIC BOTTLES CRITICAL VALUE NOTED.  VALUE IS CONSISTENT WITH PREVIOUSLY REPORTED AND CALLED VALUE.    Culture (A)  Final    ENTEROCOCCUS FAECALIS SUSCEPTIBILITIES PERFORMED ON PREVIOUS CULTURE WITHIN THE LAST 5 DAYS. Performed at [facility], [place] [address], [place], [place] [address]    Report Status 01/26/2021 FINAL  Final  Blood Culture ID Panel (Reflexed)     Status: Abnormal   Collection Time: 01/23/21  6:09 PM  Result Value Ref Range Status   Enterococcus faecalis DETECTED (A) NOT DETECTED Final    Comment: CRITICAL RESULT CALLED TO, READ BACK BY AND VERIFIED WITH: [place] [employer] Mervyn Gay [id number] MLM  Enterococcus Faecium NOT DETECTED NOT DETECTED Final   Listeria monocytogenes NOT DETECTED NOT DETECTED Final   Staphylococcus species NOT DETECTED NOT DETECTED Final   Staphylococcus aureus (BCID) NOT DETECTED NOT DETECTED Final   Staphylococcus epidermidis NOT DETECTED NOT DETECTED Final   Staphylococcus lugdunensis NOT DETECTED NOT DETECTED Final   Streptococcus species NOT DETECTED NOT DETECTED Final   Streptococcus agalactiae NOT DETECTED NOT DETECTED Final   Streptococcus pneumoniae NOT DETECTED NOT DETECTED Final   Streptococcus pyogenes NOT DETECTED NOT DETECTED Final   A.calcoaceticus-baumannii NOT DETECTED NOT DETECTED Final   Bacteroides fragilis NOT DETECTED NOT DETECTED Final   Enterobacterales NOT DETECTED NOT DETECTED Final   Enterobacter cloacae complex NOT DETECTED NOT DETECTED Final   Escherichia coli NOT DETECTED NOT DETECTED Final   Klebsiella aerogenes NOT DETECTED NOT DETECTED Final   Klebsiella oxytoca NOT DETECTED NOT DETECTED Final   Klebsiella  pneumoniae NOT DETECTED NOT DETECTED Final   Proteus species NOT DETECTED NOT DETECTED Final   Salmonella species NOT DETECTED NOT DETECTED Final   Serratia marcescens NOT DETECTED NOT DETECTED Final   Haemophilus influenzae NOT DETECTED NOT DETECTED Final   Neisseria meningitidis NOT DETECTED NOT DETECTED Final   Pseudomonas aeruginosa NOT DETECTED NOT DETECTED Final   Stenotrophomonas maltophilia NOT DETECTED NOT DETECTED Final   Candida albicans NOT DETECTED NOT DETECTED Final   Candida auris NOT DETECTED NOT DETECTED Final   Candida glabrata NOT DETECTED NOT DETECTED Final   Candida krusei NOT DETECTED NOT DETECTED Final   Candida parapsilosis NOT DETECTED NOT DETECTED Final   Candida tropicalis NOT DETECTED NOT DETECTED Final   Cryptococcus neoformans/gattii NOT DETECTED NOT DETECTED Final   Vancomycin resistance NOT DETECTED NOT DETECTED Final    Comment: Performed at [facility], [address] [place], [place] [address]  Culture, blood (routine x 2)     Status: None (Preliminary result)   Collection Time: 01/25/21 11:21 AM   Specimen: BLOOD RIGHT HAND  Result Value Ref Range Status   Specimen Description BLOOD RIGHT HAND  Final   Special Requests   Final    BOTTLES DRAWN AEROBIC AND ANAEROBIC Blood Culture adequate volume   Culture   Final    NO GROWTH 2 DAYS Performed at [facility], [place] [address] [place], [place] [address]    Report Status PENDING  Incomplete  Culture, blood (routine x 2)     Status: Abnormal   Collection Time: 01/25/21 11:41 AM   Specimen: BLOOD RIGHT HAND  Result Value Ref Range Status   Specimen Description BLOOD RIGHT HAND  Final   Special Requests   Final    BOTTLES DRAWN AEROBIC ONLY Blood Culture adequate volume   Culture  Setup Time   Final    GRAM POSITIVE COCCI AEROBIC BOTTLE ONLY CRITICAL VALUE NOTED.  VALUE IS CONSISTENT WITH PREVIOUSLY REPORTED AND CALLED VALUE.    Culture (A)  Final    STAPHYLOCOCCUS EPIDERMIDIS THE  SIGNIFICANCE OF ISOLATING THIS ORGANISM FROM A SINGLE SET OF BLOOD CULTURES WHEN MULTIPLE SETS ARE DRAWN IS UNCERTAIN. PLEASE NOTIFY THE MICROBIOLOGY DEPARTMENT WITHIN ONE WEEK IF SPECIATION AND SENSITIVITIES ARE REQUIRED. Performed at [facility], [place] [address] [place], [place] [address]    Report Status 01/27/2021 FINAL  Final    Impression/Plan:  1. Infective endocarditis - positive Enterococcal bacteremia and vegetation on TTE c/w infective endocarditis.  She is on ceftriaxone and ampicillin for a prolonged course and will continue.  Repeat blood cultures with one positive culture with Staph epidermidis c/w a contaminate.   2.  Medication monitoring - LFTs, creat stable yesterday.  WBC wnl.  From an ID standpoint, will need weekly CBC, CMP  3.  OPAT - will leave opat order today; ok for a picc line at any time if needed, depending on ongoing goals of care discussions.    Call with any questions or changes.   Diagnosis: Infective endocarditis  Culture Result: Enterococcus faecalis  Allergies  Allergen Reactions   Oxycodone Other (See Comments)    Hallunication    OPAT Orders Discharge antibiotics to be given via PICC line Discharge antibiotics: ceftriaxone IV 2 grams every 12 hours + ampicillin 2 grams every 6 hours IV as a continuous infusion Per pharmacy protocol yes Duration: 6 weeks End Date: March 07, 2021  [facility] Care Per Protocol: yes  Home health RN for IV administration and teaching; PICC line care and labs.    Labs weekly while on IV antibiotics: __x CBC with differential __ BMP __x CMP __ CRP __ ESR __ Vancomycin trough __ CK  _x_ Please pull PIC at completion of IV antibiotics __ Please leave PIC in place until doctor has seen patient or been notified  Fax weekly labs to [phone]  Clinic Follow Up Appt: N/A, we can follow up with her as needed, labs will be monitored by SNF

## 2021-01-27 NOTE — Progress Notes (Signed)
Daily Progress Note   Patient Name: Carrie Monroe       Date: 01/27/2021 DOB: [birth year]  Age: 85 y.o. MRN#: [id number] Attending Physician: Darliss Cheney, MD Primary Care Physician: Ginger Organ., MD Admit Date: 01/18/2021  Reason for Consultation/Follow-up: Establishing goals of care  Subjective: Medical records reviewed. Assessed patient at the bedside. She is more alert today, smiling and acknowledging the yesterday was a very difficult day for her. Her son Carrie Monroe is present at the bedside.  I introduced Palliative Medicine as specialized medical care for people living with serious illness. Carrie Monroe is initially concerned and asks "are you getting ready to put me in the ground." I clarified the difference between palliative care and hospice and reassured her that PMT is available for assistance along with curative treatment plans. We are also available to connect patients and family with hospice services if goals of care change during the course of illness. She is in good spirits and verbalizes her understanding. She is appreciative of [facility] and shares that her husband was cared for here before his death a few years ago.  Carrie Monroe is glad to be feeling better and quite hopeful for improvement. We discussed the importance of ongoing discussions and planning for all possible outcomes. I shared that while I hope she continues to feel better, I worry that she may decline and we may need to discuss a focus on comfort and end of life care. Carrie Monroe and Carrie Monroe voice their appreciation and confirm the goal to continue life-prolonging measures at this time. They would like to see how she does over the weekend and meet again with PMT on Monday.        Questions and concerns addressed. Hard Choices  booklet provided for review. Family was encouraged to call with additional needs. PMT will continue to support holistically.      Length of Stay: 8  Current Medications: Scheduled Meds:   sodium chloride   Intravenous Once   amiodarone  200 mg Oral Daily   apixaban  5 mg Oral BID   Chlorhexidine Gluconate Cloth  6 each Topical Daily   ezetimibe  10 mg Oral Daily   feeding supplement  237 mL Oral TID BM   ferrous sulfate  325 mg Oral Daily   hydrALAZINE  10 mg Oral Q8H  insulin aspart  0-15 Units Subcutaneous TID WC   insulin aspart  0-5 Units Subcutaneous QHS   isosorbide mononitrate  15 mg Oral Daily   multivitamin with minerals  1 tablet Oral Daily   tamsulosin  0.4 mg Oral QPC breakfast   vitamin B-12  500 mcg Oral Daily    Continuous Infusions:  ampicillin (OMNIPEN) IV 2 g (01/27/21 1319)   cefTRIAXone (ROCEPHIN)  IV 2 g (01/27/21 0938)    PRN Meds: morphine injection  Physical Exam Vitals and nursing note reviewed.  Constitutional:      General: She is not in acute distress.    Appearance: She is ill-appearing.     Comments: Elderly, frail  Cardiovascular:     Rate and Rhythm: Normal rate.  Pulmonary:     Effort: Pulmonary effort is normal.  Neurological:     Mental Status: She is alert and easily aroused.  Psychiatric:        Mood and Affect: Mood normal.            Vital Signs: BP (!) 129/45   Pulse 77   Temp 98.5 F (36.9 C) (Oral)   Resp (!) 21   Ht 5\' 2"  (1.575 m)   Wt 77.2 kg   SpO2 98%   BMI 31.13 kg/m  SpO2: SpO2: 98 % O2 Device: O2 Device: Room Air O2 Flow Rate:    Intake/output summary:  Intake/Output Summary (Last 24 hours) at 01/27/2021 1334 Last data filed at 01/27/2021 1100 Gross per 24 hour  Intake 1582.66 ml  Output --  Net 1582.66 ml    LBM: Last BM Date: 01/21/21 Baseline Weight: Weight: 77.4 kg Most recent weight: Weight: 77.2 kg       Palliative Assessment/Data: 40% at best      Patient Active Problem List    Diagnosis Date Noted   Subacute bacterial endocarditis    Cirrhosis of liver without ascites (HCC)    Obstructive uropathy    ARF (acute renal failure) ([place]) 01/18/2021   Acute encephalopathy 01/18/2021   Paroxysmal atrial fibrillation (HCC)    Tachycardia-bradycardia syndrome (HCC)    LBBB (left bundle branch block)    Volume overload 01/02/2021   Dizziness 07/14/2020   Biventricular failure ([place]) 07/14/2020   Pressure injury of skin 03/03/2020   Acute on chronic combined systolic and diastolic CHF (congestive heart failure) ([place]) 03/02/2020   AKI (acute kidney injury) ([place]) 03/02/2020   Thrombocytopenia ([place]) 03/02/2020   Secondary hypercoagulable state ([place]) 01/13/2020   Persistent atrial fibrillation (HCC)    Hypoxia    Acute on chronic diastolic CHF (congestive heart failure), NYHA class 3 ([place]) 11/21/2019   Atrial fibrillation with rapid ventricular response ([place]) 11/21/2019   Fever    Carotid stenosis 03/13/2019   Goals of care, counseling/discussion 11/27/2017   PICC (peripherally inserted central catheter) in place 11/27/2017   MSSA bacteremia 11/05/2017   Wound infection after surgery 11/04/2017   Anemia 09/27/2017   Hyperlipidemia    History of kidney stones    Hemorrhoids    Exogenous obesity    Chronic diastolic CHF (congestive heart failure) (HCC)    Arthritis    Aortic stenosis, severe    Chronic respiratory failure with hypoxia (HCC)    Hemorrhagic shock (HCC)    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   Carotid artery stenosis    Three-vessel CAD-PCI to LAD and LCx, med management of RCA    Chronic renal insufficiency, stage III (moderate) ([place]) 12/22/2016  Dental abscess- s/p multiple tooth extraction 12/21/16 12/22/2016   CAD- severe 3V CAD     Essential hypertension 01/27/2016   Morbid obesity due to excess calories ([place]) 01/27/2016   Gout 06/22/2013   Dyslipidemia 05/08/2011   Severe aortic stenosis 05/08/2011   Breast cancer  of upper-outer quadrant of left female breast ([place]) 03/16/2011    Palliative Care Assessment & Plan   Patient Profile: 85 y.o. female  with past medical history of CHF, CAD status post stenting, A. fib tachybradycardia syndrome, diabetes mellitus, chronic kidney disease and anemia admitted on 01/18/2021 with elevated creatinine from [facility].   Patient was recently admitted for CHF exacerbation after several weeks of worsening symptoms. CT now shows prominent distention of the bladder with bilateral hydronephrosis and hydroureter, changes of hepatic cirrhosis with prominent splenic vein varices and splenomegaly.    Patient also has Enterococcus bacteremia/bacterial endocarditis and multiple comorbidities. Palliative medicine has been consulted to assist with goals of care conversation.     Assessment: Acute renal failure on MVE7M Acute metabolic encephalopathy  Enterococcus bacteremia/bacterial endocarditis UTI Goals of care conversation  Recommendations/Plan: Continue current interventions/therapies Patient and son Carrie Monroe are hopeful for improvement, she is more alert and participated in goc discussion today Psychosocial and emotional support provided PMT will follow remotely over the weekend and f/u with patient and family on Monday  Goals of Care and Additional Recommendations: Limitations on Scope of Treatment: Full Scope Treatment  Prognosis:  Guarded  Discharge Planning: [place] for rehab with Palliative care service follow-up  Care plan was discussed with patient, patient's son Carrie Monroe, Dr. Doristine Bosworth  Total time: 25 minutes Greater than 50% of this time was spent in counseling and coordinating care related to the above assessment and plan.  Dorthy Cooler, PA-C Palliative Medicine Team Team phone # [phone]  Thank you for allowing the Palliative Medicine Team to assist in the care of this patient. Please utilize secure chat with additional  questions, if there is no response within 30 minutes please call the above phone number.  Palliative Medicine Team providers are available by phone from 7am to 7pm daily and can be reached through the team cell phone.  Should this patient require assistance outside of these hours, please call the patient's attending physician.

## 2021-01-27 NOTE — Progress Notes (Signed)
PROGRESS NOTE    Carrie Monroe  MIW:[id number] DOB: Sep 18, 1935 DOA: 01/18/2021 PCP: Ginger Organ., MD    Brief Narrative:  85 year old female with history of congestive heart failure, coronary artery disease status post stenting, atrial fibrillation tachybradycardia syndrome, diabetes, chronic kidney disease and anemia recently admitted to the hospital with weakness and pseudogout of the knee was discharged to a skilled nursing rehab 9 days ago brought back to the emergency room with elevated creatinine on routine exam at the skilled nursing facility.  Patient is poor historian.  Patient's daughter tells me that she had been looking weak and tired however she has not noticed anything new.  In the emergency room CT scan abdomen pelvis showed prominent distention of the bladder with bilateral hydronephrosis and hydroureter, changes of hepatic cirrhosis with prominent splenic vein varices and splenomegaly.  Creatinine 2.3 and BUN 137.  A Foley catheter was placed.  Admitted for further work-up.  Recent hospitalization 01/02/2021--01/09/2021 with severe fatigue generalized weakness-found to have acute superimposed on chronic diastolic heart failure as well as decompensated pulmonary hypertension--at that admission palliative care consulted  Assessment & Plan:   Principal Problem:   ARF (acute renal failure) ([place]) Active Problems:   Essential hypertension   CAD- severe 3V CAD    Three-vessel CAD-PCI to LAD and LCx, med management of RCA   S/P TAVR (transcatheter aortic valve replacement)   Aortic stenosis, severe   Goals of care, counseling/discussion   Persistent atrial fibrillation (HCC)   AKI (acute kidney injury) ([place])   Acute encephalopathy   Subacute bacterial endocarditis   Cirrhosis of liver without ascites ([place])   Obstructive uropathy  Acute kidney injury on chronic kidney disease stage 3a: Baseline creatinine about 1.5.  Presented with creatinine of 2.35.  CT abdomen at  admission showed prominent urinary bladder with bilateral hydronephrosis and hydroureter, probably postobstructive and dehydration.  Renal function now at baseline now.  Continue and Discharge with Foley catheter until patient has good clinical improvement of her mobility.  Continue Flomax.  Acute metabolic encephalopathy in a patient with multiple medical problems: CT head unremarkable.  Likely metabolic from uremia and UTI.  Urea 135 on presentation.  Trending down.  Ammonia is normal. B 12 and folic acid is normal.  Vernard Gambles remains intermittently lethargic due to her bacterial endocarditis.  Enterococcus bacteremia/bacterial endocarditis: Transthoracic echo shows large mitral valve vegetation/endocarditis.  Blood cultures are now growing Enterococcus faecalis.  ID consulted patient has been started on Rocephin and ampicillin.  We have consulted ID.  Patient seen by CT surgery.  Due to multiple comorbidities, she is deemed to be a poor surgical candidate so CT surgery did not recommend any surgery.  ID recommends 6 weeks of IV antibiotics.  Palliative care on board leading the communication with the family.  Family is open to considering comfort care if patient were to go [facility] however at this point in time, they would like to continue antibiotics and pursue rehab if possible.  TOC made aware.  UTI: Urinalysis with more than 80,000 Pseudomonas, received cefepime for 5 days, ended on 01/22/2021.  Elevated LFTs with CT scan concerning for hepatic cirrhosis: Hepatitis panel negative.  Mild abnormal LFTs, cardiology discontinued statin and started her on Zetia.  Patient also on amiodarone.  Per cardiology, this needs to be continued as she does not have any other options for her atrial fibrillation.  LFTs improving.  Prediabetes: Hemoglobin A1c 5.7.  Continue SSI.  Permanent A. fib on amiodarone and  Eliquis: Continue amiodarone.  Therapeutic on Eliquis.  Repeat EKG consistent with chronic left bundle branch  block pattern.  Cardiology on board and managing.  Appreciate their help.  Chronic combined heart failure: Without exacerbation.  Euvolemic other than some bilateral lower extremity edema but no pulmonary edema.  Received IV fluids.  Cardiology was consulted for this.  She received 1 dose of Lasix on 01/22/2021.  Renal function is stable.  No further diuretics planned by cardiology.  Appreciate their help.  Essential hypertension: Blood pressure controlled.  Continue hydralazine and Imdur.  Hyperlipidemia: Crestor was switched to Zetia by cardiology on 01/23/2021 due to elevated LFTs and the need for continuation of amiodarone in order to protect liver  Debility: PT OT recommends SNF.  TOC on board for placement   DVT prophylaxis:  apixaban (ELIQUIS) tablet 5 mg   Code Status: DNR Family Communication: None at bedside.   Palliative care leading communications with the family. Disposition Plan: Status is: Inpatient.   Dispo: The patient is from: SNF              Anticipated d/c is to: SNF              Patient currently is medically stable.   Difficult to place patient No    Consultants:  None  Procedures:  None  Antimicrobials:  Rocephin 8/24--- 8/25 Cefepime 8/26--01/22/2021 Rocephin 01/24/2021> Ampicillin 01/24/2021>   Subjective: Seen and examined.  Appears to be more alert and slightly more oriented compared to yesterday.  Has no complaints.  Objective: Vitals:   01/27/21 0458 01/27/21 0751 01/27/21 0931 01/27/21 1155  BP: (!) 121/42 (!) 123/35 (!) 121/43 (!) 137/43  Pulse: 72 73 71 79  Resp: 18 (!) 24 20 18   Temp: 99.7 F (37.6 C)  98.7 F (37.1 C) 98.5 F (36.9 C)  TempSrc: Axillary  Oral Oral  SpO2: 97% 99% 96%   Weight:      Height:        Intake/Output Summary (Last 24 hours) at 01/27/2021 1302 Last data filed at 01/27/2021 1100 Gross per 24 hour  Intake 1582.66 ml  Output --  Net 1582.66 ml    Filed Weights   01/18/21 1747 01/23/21 1239  Weight:  77.4 kg 77.2 kg    Examination: General exam: Appears calm and comfortable  Respiratory system: Clear to auscultation. Respiratory effort normal. Cardiovascular system: S1 & S2 heard, RRR. No JVD, murmurs, rubs, gallops, click is present though.  No pedal edema. Gastrointestinal system: Abdomen is nondistended, soft and nontender. No organomegaly or masses felt. Normal bowel sounds heard. Central nervous system: Alert and oriented. No focal neurological deficits. Extremities: Symmetric 5 x 5 power. Skin: No rashes, lesions or ulcers.     Data Reviewed: I have personally reviewed following labs and imaging studies  CBC: Recent Labs  Lab 01/23/21 0638 01/23/21 1519 01/24/21 0205 01/25/21 0044 01/26/21 0307 01/27/21 0234  WBC 6.1  --  6.4 6.9 5.3 6.5  NEUTROABS 4.8  --  5.2 5.7 4.2 5.2  HGB 8.9* 8.1* 8.1* 8.4* 7.5* 7.9*  HCT 28.4* 25.9* 26.4* 27.9* 24.9* 25.5*  MCV 99.0  --  99.2 101.5* 102.0* 100.8*  PLT 94*  --  89* 87* 88* 87*    Basic Metabolic Panel: Recent Labs  Lab 01/21/21 0314 01/22/21 0515 01/23/21 0638 01/24/21 0205 01/25/21 0044 01/26/21 0307  NA 147* 144 145 146* 149* 150*  K 3.8 3.8 3.6 3.6 3.8 3.7  CL 119* 118* 120* 120*  122* 121*  CO2 24 22 21* 23 23 25   GLUCOSE 146* 130* 120* 131* 165* 191*  BUN 78* 56* 52* 50* 50* 49*  CREATININE 1.39* 1.16* 1.24* 1.23* 1.31* 1.14*  CALCIUM 8.9 8.9 8.9 8.9 9.1 8.9  MG 2.2  --   --   --   --   --   PHOS 2.4*  --   --   --   --   --     GFR: Estimated Creatinine Clearance: 36.6 mL/min (A) (by C-G formula based on SCr of 1.14 mg/dL (H)). Liver Function Tests: Recent Labs  Lab 01/22/21 0515 01/23/21 5366 01/24/21 0205 01/25/21 0044 01/26/21 0307  AST 91* 80* 62* 60* 53*  ALT 102* 95* 80* 79* 61*  ALKPHOS 106 118 111 136* 145*  BILITOT 0.6 1.1 0.8 0.4 <0.1*  PROT 4.6* 4.7* 4.6* 4.9* 4.5*  ALBUMIN 1.5* 1.5* 1.4* 1.5* 1.4*    No results for input(s): LIPASE, AMYLASE in the last 168 hours.  No results  for input(s): AMMONIA in the last 168 hours.  Coagulation Profile: Recent Labs  Lab 01/23/21 0638  INR 1.4*    Cardiac Enzymes: No results for input(s): CKTOTAL, CKMB, CKMBINDEX, TROPONINI in the last 168 hours. BNP (last 3 results) No results for input(s): PROBNP in the last 8760 hours. HbA1C: Recent Labs    01/26/21 0307  HGBA1C 5.7*   CBG: Recent Labs  Lab 01/26/21 1134 01/26/21 1649 01/26/21 2116 01/27/21 0751 01/27/21 1153  GLUCAP 235* 204* 282* 122* 165*    Lipid Profile: No results for input(s): CHOL, HDL, LDLCALC, TRIG, CHOLHDL, LDLDIRECT in the last 72 hours. Thyroid Function Tests: No results for input(s): TSH, T4TOTAL, FREET4, T3FREE, THYROIDAB in the last 72 hours. Anemia Panel: No results for input(s): VITAMINB12, FOLATE, FERRITIN, TIBC, IRON, RETICCTPCT in the last 72 hours.  Sepsis Labs: No results for input(s): PROCALCITON, LATICACIDVEN in the last 168 hours.  Recent Results (from the past 240 hour(s))  Resp Panel by RT-PCR (Flu A&B, Covid) Nasopharyngeal Swab     Status: None   Collection Time: 01/18/21  6:27 PM   Specimen: Nasopharyngeal Swab; Nasopharyngeal(NP) swabs in vial transport medium  Result Value Ref Range Status   SARS Coronavirus 2 by RT PCR NEGATIVE NEGATIVE Final    Comment: (NOTE) SARS-CoV-2 target nucleic acids are NOT DETECTED.  The SARS-CoV-2 RNA is generally detectable in upper respiratory specimens during the acute phase of infection. The lowest concentration of SARS-CoV-2 viral copies this assay can detect is 138 copies/mL. A negative result does not preclude SARS-Cov-2 infection and should not be used as the sole basis for treatment or other patient management decisions. A negative result may occur with  improper specimen collection/handling, submission of specimen other than nasopharyngeal swab, presence of viral mutation(s) within the areas targeted by this assay, and inadequate number of viral copies(<138 copies/mL).  A negative result must be combined with clinical observations, patient history, and epidemiological information. The expected result is Negative.  Fact Sheet for Patients:  EntrepreneurPulse.com.au  Fact Sheet for Healthcare Providers:  IncredibleEmployment.be  This test is no t yet approved or cleared by the [place] FDA and  has been authorized for detection and/or diagnosis of SARS-CoV-2 by FDA under an Emergency Use Authorization (EUA). This EUA will remain  in effect (meaning this test can be used) for the duration of the COVID-19 declaration under Section 564(b)(1) of the Act, 21 U.S.C.section 360bbb-3(b)(1), unless the authorization is terminated  or revoked sooner.  Influenza A by PCR NEGATIVE NEGATIVE Final   Influenza B by PCR NEGATIVE NEGATIVE Final    Comment: (NOTE) The Xpert Xpress SARS-CoV-2/FLU/RSV plus assay is intended as an aid in the diagnosis of influenza from Nasopharyngeal swab specimens and should not be used as a sole basis for treatment. Nasal washings and aspirates are unacceptable for Xpert Xpress SARS-CoV-2/FLU/RSV testing.  Fact Sheet for Patients: EntrepreneurPulse.com.au  Fact Sheet for Healthcare Providers: IncredibleEmployment.be  This test is not yet approved or cleared by the [place] FDA and has been authorized for detection and/or diagnosis of SARS-CoV-2 by FDA under an Emergency Use Authorization (EUA). This EUA will remain in effect (meaning this test can be used) for the duration of the COVID-19 declaration under Section 564(b)(1) of the Act, 21 U.S.C. section 360bbb-3(b)(1), unless the authorization is terminated or revoked.  Performed at [facility], [place] [address] [place], [place] [address]   Urine Culture     Status: Abnormal   Collection Time: 01/18/21 11:10 PM   Specimen: Urine, Clean Catch  Result Value Ref Range Status    Specimen Description URINE, CLEAN CATCH  Final   Special Requests   Final    NONE Performed at [facility], [place] [address], [place], [place] [address]    Culture 80,000 COLONIES/mL PSEUDOMONAS AERUGINOSA (A)  Final   Report Status 01/21/2021 FINAL  Final   Organism ID, Bacteria PSEUDOMONAS AERUGINOSA (A)  Final      Susceptibility   Pseudomonas aeruginosa - MIC*    CEFTAZIDIME 4 SENSITIVE Sensitive     CIPROFLOXACIN <=0.25 SENSITIVE Sensitive     GENTAMICIN 2 SENSITIVE Sensitive     IMIPENEM 2 SENSITIVE Sensitive     PIP/TAZO 8 SENSITIVE Sensitive     CEFEPIME 2 SENSITIVE Sensitive     * 80,000 COLONIES/mL PSEUDOMONAS AERUGINOSA  Culture, blood (Routine X 2) w Reflex to ID Panel     Status: Abnormal   Collection Time: 01/23/21  6:09 PM   Specimen: BLOOD  Result Value Ref Range Status   Specimen Description BLOOD RIGHT ANTECUBITAL  Final   Special Requests   Final    BOTTLES DRAWN AEROBIC AND ANAEROBIC Blood Culture adequate volume   Culture  Setup Time   Final    GRAM POSITIVE COCCI IN CHAINS IN BOTH AEROBIC AND ANAEROBIC BOTTLES Organism ID to follow CRITICAL RESULT CALLED TO, READ BACK BY AND VERIFIED WITH: Ronald Pippins [id number] Performed at [facility], [place] [address], [place], [place] [address]    Culture ENTEROCOCCUS FAECALIS (A)  Final   Report Status 01/26/2021 FINAL  Final   Organism ID, Bacteria ENTEROCOCCUS FAECALIS  Final      Susceptibility   Enterococcus faecalis - MIC*    AMPICILLIN <=2 SENSITIVE Sensitive     VANCOMYCIN 1 SENSITIVE Sensitive     GENTAMICIN SYNERGY SENSITIVE Sensitive     * ENTEROCOCCUS FAECALIS  Culture, blood (Routine X 2) w Reflex to ID Panel     Status: Abnormal   Collection Time: 01/23/21  6:09 PM   Specimen: BLOOD RIGHT HAND  Result Value Ref Range Status   Specimen Description BLOOD RIGHT HAND  Final   Special Requests   Final    BOTTLES DRAWN AEROBIC AND ANAEROBIC Blood Culture adequate volume   Culture   Setup Time   Final    GRAM POSITIVE COCCI IN CHAINS IN BOTH AEROBIC AND ANAEROBIC BOTTLES CRITICAL VALUE NOTED.  VALUE IS  CONSISTENT WITH PREVIOUSLY REPORTED AND CALLED VALUE.    Culture (A)  Final    ENTEROCOCCUS FAECALIS SUSCEPTIBILITIES PERFORMED ON PREVIOUS CULTURE WITHIN THE LAST 5 DAYS. Performed at [facility], [place] [address], [place], [place] [address]    Report Status 01/26/2021 FINAL  Final  Blood Culture ID Panel (Reflexed)     Status: Abnormal   Collection Time: 01/23/21  6:09 PM  Result Value Ref Range Status   Enterococcus faecalis DETECTED (A) NOT DETECTED Final    Comment: CRITICAL RESULT CALLED TO, READ BACK BY AND VERIFIED WITH: Ronald Pippins [id number]    Enterococcus Faecium NOT DETECTED NOT DETECTED Final   Listeria monocytogenes NOT DETECTED NOT DETECTED Final   Staphylococcus species NOT DETECTED NOT DETECTED Final   Staphylococcus aureus (BCID) NOT DETECTED NOT DETECTED Final   Staphylococcus epidermidis NOT DETECTED NOT DETECTED Final   Staphylococcus lugdunensis NOT DETECTED NOT DETECTED Final   Streptococcus species NOT DETECTED NOT DETECTED Final   Streptococcus agalactiae NOT DETECTED NOT DETECTED Final   Streptococcus pneumoniae NOT DETECTED NOT DETECTED Final   Streptococcus pyogenes NOT DETECTED NOT DETECTED Final   A.calcoaceticus-baumannii NOT DETECTED NOT DETECTED Final   Bacteroides fragilis NOT DETECTED NOT DETECTED Final   Enterobacterales NOT DETECTED NOT DETECTED Final   Enterobacter cloacae complex NOT DETECTED NOT DETECTED Final   Escherichia coli NOT DETECTED NOT DETECTED Final   Klebsiella aerogenes NOT DETECTED NOT DETECTED Final   Klebsiella oxytoca NOT DETECTED NOT DETECTED Final   Klebsiella pneumoniae NOT DETECTED NOT DETECTED Final   Proteus species NOT DETECTED NOT DETECTED Final   Salmonella species NOT DETECTED NOT DETECTED Final   Serratia marcescens NOT DETECTED NOT DETECTED Final   Haemophilus influenzae NOT  DETECTED NOT DETECTED Final   Neisseria meningitidis NOT DETECTED NOT DETECTED Final   Pseudomonas aeruginosa NOT DETECTED NOT DETECTED Final   Stenotrophomonas maltophilia NOT DETECTED NOT DETECTED Final   Candida albicans NOT DETECTED NOT DETECTED Final   Candida auris NOT DETECTED NOT DETECTED Final   Candida glabrata NOT DETECTED NOT DETECTED Final   Candida krusei NOT DETECTED NOT DETECTED Final   Candida parapsilosis NOT DETECTED NOT DETECTED Final   Candida tropicalis NOT DETECTED NOT DETECTED Final   Cryptococcus neoformans/gattii NOT DETECTED NOT DETECTED Final   Vancomycin resistance NOT DETECTED NOT DETECTED Final    Comment: Performed at [facility], [address] [place], [place] [address]  Culture, blood (routine x 2)     Status: None (Preliminary result)   Collection Time: 01/25/21 11:21 AM   Specimen: BLOOD RIGHT HAND  Result Value Ref Range Status   Specimen Description BLOOD RIGHT HAND  Final   Special Requests   Final    BOTTLES DRAWN AEROBIC AND ANAEROBIC Blood Culture adequate volume   Culture   Final    NO GROWTH 2 DAYS Performed at [facility], [place] [address], [place], [place] [address]    Report Status PENDING  Incomplete  Culture, blood (routine x 2)     Status: Abnormal   Collection Time: 01/25/21 11:41 AM   Specimen: BLOOD RIGHT HAND  Result Value Ref Range Status   Specimen Description BLOOD RIGHT HAND  Final   Special Requests   Final    BOTTLES DRAWN AEROBIC ONLY Blood Culture adequate volume   Culture  Setup Time   Final    GRAM POSITIVE COCCI AEROBIC BOTTLE ONLY CRITICAL VALUE NOTED.  VALUE IS CONSISTENT WITH  PREVIOUSLY REPORTED AND CALLED VALUE.    Culture (A)  Final    STAPHYLOCOCCUS EPIDERMIDIS THE SIGNIFICANCE OF ISOLATING THIS ORGANISM FROM A SINGLE SET OF BLOOD CULTURES WHEN MULTIPLE SETS ARE DRAWN IS UNCERTAIN. PLEASE NOTIFY THE MICROBIOLOGY DEPARTMENT WITHIN ONE WEEK IF SPECIATION AND SENSITIVITIES ARE REQUIRED. Performed  at [facility], [place] [address], [place], [place] [address]    Report Status 01/27/2021 FINAL  Final    Radiology Studies: No results found.  Scheduled Meds:  sodium chloride   Intravenous Once   amiodarone  200 mg Oral Daily   apixaban  5 mg Oral BID   Chlorhexidine Gluconate Cloth  6 each Topical Daily   ezetimibe  10 mg Oral Daily   feeding supplement  237 mL Oral TID BM   ferrous sulfate  325 mg Oral Daily   hydrALAZINE  10 mg Oral Q8H   insulin aspart  0-15 Units Subcutaneous TID WC   insulin aspart  0-5 Units Subcutaneous QHS   isosorbide mononitrate  15 mg Oral Daily   multivitamin with minerals  1 tablet Oral Daily   tamsulosin  0.4 mg Oral QPC breakfast   vitamin B-12  500 mcg Oral Daily   Continuous Infusions:  ampicillin (OMNIPEN) IV 2 g (01/27/21 0552)   cefTRIAXone (ROCEPHIN)  IV 2 g (01/27/21 0938)     LOS: 8 days   Time spent: 27 minutes  Darliss Cheney, MD [facility]

## 2021-01-27 NOTE — Care Management Important Message (Signed)
Important Message  Patient Details  Name: Carrie Monroe MRN: [id number] Date of Birth: [birth year]   Medicare Important Message Given:  Yes     Shelda Altes 01/27/2021, 12:01 PM

## 2021-01-28 LAB — CBC WITH DIFFERENTIAL/PLATELET
Abs Immature Granulocytes: 0.04 10*3/uL (ref 0.00–0.07)
Basophils Absolute: 0 10*3/uL (ref 0.0–0.1)
Basophils Relative: 1 %
Eosinophils Absolute: 0.2 10*3/uL (ref 0.0–0.5)
Eosinophils Relative: 3 %
HCT: 24.6 % — ABNORMAL LOW (ref 36.0–46.0)
Hemoglobin: 7.5 g/dL — ABNORMAL LOW (ref 12.0–15.0)
Immature Granulocytes: 1 %
Lymphocytes Relative: 14 %
Lymphs Abs: 0.8 10*3/uL (ref 0.7–4.0)
MCH: 30.4 pg (ref 26.0–34.0)
MCHC: 30.5 g/dL (ref 30.0–36.0)
MCV: 99.6 fL (ref 80.0–100.0)
Monocytes Absolute: 0.3 10*3/uL (ref 0.1–1.0)
Monocytes Relative: 5 %
Neutro Abs: 4.7 10*3/uL (ref 1.7–7.7)
Neutrophils Relative %: 76 %
Platelets: 91 10*3/uL — ABNORMAL LOW (ref 150–400)
RBC: 2.47 MIL/uL — ABNORMAL LOW (ref 3.87–5.11)
RDW: 16.6 % — ABNORMAL HIGH (ref 11.5–15.5)
WBC: 6 10*3/uL (ref 4.0–10.5)
nRBC: 0 % (ref 0.0–0.2)

## 2021-01-28 LAB — GLUCOSE, CAPILLARY
Glucose-Capillary: 112 mg/dL — ABNORMAL HIGH (ref 70–99)
Glucose-Capillary: 112 mg/dL — ABNORMAL HIGH (ref 70–99)
Glucose-Capillary: 112 mg/dL — ABNORMAL HIGH (ref 70–99)
Glucose-Capillary: 117 mg/dL — ABNORMAL HIGH (ref 70–99)
Glucose-Capillary: 120 mg/dL — ABNORMAL HIGH (ref 70–99)
Glucose-Capillary: 144 mg/dL — ABNORMAL HIGH (ref 70–99)

## 2021-01-28 NOTE — Progress Notes (Signed)
PROGRESS NOTE    Carrie Monroe  RFF:[id number] DOB: 1935/08/16 DOA: 01/18/2021 PCP: Ginger Organ., MD    Brief Narrative:  85 year old female with history of congestive heart failure, coronary artery disease status post stenting, atrial fibrillation tachybradycardia syndrome, diabetes, chronic kidney disease and anemia recently admitted to the hospital with weakness and pseudogout of the knee was discharged to a skilled nursing rehab 9 days ago brought back to the emergency room with elevated creatinine on routine exam at the skilled nursing facility.  Patient is poor historian.  Patient's daughter tells me that she had been looking weak and tired however she has not noticed anything new.  In the emergency room CT scan abdomen pelvis showed prominent distention of the bladder with bilateral hydronephrosis and hydroureter, changes of hepatic cirrhosis with prominent splenic vein varices and splenomegaly.  Creatinine 2.3 and BUN 137.  A Foley catheter was placed.  Admitted for further work-up.  Recent hospitalization 01/02/2021--01/09/2021 with severe fatigue generalized weakness-found to have acute superimposed on chronic diastolic heart failure as well as decompensated pulmonary hypertension--at that admission palliative care consulted  Assessment & Plan:   Principal Problem:   ARF (acute renal failure) ([place]) Active Problems:   Essential hypertension   CAD- severe 3V CAD    Three-vessel CAD-PCI to LAD and LCx, med management of RCA   S/P TAVR (transcatheter aortic valve replacement)   Aortic stenosis, severe   Goals of care, counseling/discussion   Persistent atrial fibrillation (HCC)   AKI (acute kidney injury) ([place])   Acute encephalopathy   Subacute bacterial endocarditis   Cirrhosis of liver without ascites ([place])   Obstructive uropathy  Acute kidney injury on chronic kidney disease stage 3a: Baseline creatinine about 1.5.  Presented with creatinine of 2.35.  CT abdomen at  admission showed prominent urinary bladder with bilateral hydronephrosis and hydroureter, probably postobstructive and dehydration.  Renal function now at baseline now.  Continue and Discharge with Foley catheter until patient has good clinical improvement of her mobility.  Continue Flomax.  Acute metabolic encephalopathy in a patient with multiple medical problems: CT head unremarkable.  Likely metabolic from uremia and UTI.  Urea 135 on presentation.  Trending down.  Ammonia is normal. B 12 and folic acid is normal.  Continues to have intermittent confusion, likely secondary to bacterial endocarditis.  Enterococcus bacteremia/bacterial endocarditis: Transthoracic echo shows large mitral valve vegetation/endocarditis.  Blood cultures are now growing Enterococcus faecalis.  ID consulted patient has been started on Rocephin and ampicillin.  We have consulted ID.  Patient seen by CT surgery.  Due to multiple comorbidities, she is deemed to be a poor surgical candidate so CT surgery did not recommend any surgery.  ID recommends 6 weeks of IV antibiotics.  Palliative care on board leading the communication with the family.  Family is open to considering comfort care if patient were to go [facility] however at this point in time, they would like to continue antibiotics and pursue rehab if possible.  Family would like to wait until Monday to see how she does and have another meeting with the palliative care before making a final decision about rehab.  TOC informed.  UTI: Urinalysis with more than 80,000 Pseudomonas, received cefepime for 5 days, ended on 01/22/2021.  Elevated LFTs with CT scan concerning for hepatic cirrhosis: Hepatitis panel negative.  Mild abnormal LFTs, cardiology discontinued statin and started her on Zetia.  Patient also on amiodarone.  Per cardiology, this needs to be continued as she  does not have any other options for her atrial fibrillation.  LFTs improving.  Prediabetes: Hemoglobin A1c 5.7.   Continue SSI.  Permanent A. fib on amiodarone and Eliquis: Continue amiodarone.  Therapeutic on Eliquis.  Repeat EKG consistent with chronic left bundle branch block pattern.   Appreciate their help.  Chronic combined heart failure: Without exacerbation.  Euvolemic other than some bilateral lower extremity edema but no pulmonary edema.  Received IV fluids.  Cardiology was consulted for this.  She received 1 dose of Lasix on 01/22/2021.  Renal function is stable.  No further diuretics planned by cardiology.  She is on Imdur and hydralazine.  Not a candidate for ACE/ARB/ARNI/MRA given CKD appreciate their help.  Essential hypertension: Blood pressure controlled.  Continue hydralazine and Imdur.  Hyperlipidemia: Crestor was switched to Zetia by cardiology on 01/23/2021 due to elevated LFTs and the need for continuation of amiodarone in order to protect liver  Debility: PT OT recommends SNF.  TOC on board for placement   DVT prophylaxis:  apixaban (ELIQUIS) tablet 5 mg   Code Status: DNR Family Communication: None at bedside.   Palliative care leading communications with the family. Disposition Plan: Status is: Inpatient.   Dispo: The patient is from: SNF              Anticipated d/c is to: SNF              Patient currently is medically stable.   Difficult to place patient No    Consultants:  None  Procedures:  None  Antimicrobials:  Rocephin 8/24--- 8/25 Cefepime 8/26--01/22/2021 Rocephin 01/24/2021> Ampicillin 01/24/2021>   Subjective: Patient seen and examined.  Sleepy and easily arousable however she was confused, likely because she just woke up.  Otherwise she had no complaints.  She was comfortable.  Objective: Vitals:   01/27/21 2125 01/28/21 0401 01/28/21 0557 01/28/21 0600  BP: (!) 121/38 (!) 124/38 (!) 129/36 (!) 129/36  Pulse:  79  80  Resp:  (!) 25  (!) 25  Temp:  98.3 F (36.8 C)    TempSrc:  Oral    SpO2:  95%  96%  Weight:      Height:         Intake/Output Summary (Last 24 hours) at 01/28/2021 1133 Last data filed at 01/28/2021 0900 Gross per 24 hour  Intake 632.4 ml  Output 700 ml  Net -67.6 ml    Filed Weights   01/18/21 1747 01/23/21 1239  Weight: 77.4 kg 77.2 kg    Examination: General exam: Appears calm and comfortable  Respiratory system: Clear to auscultation. Respiratory effort normal. Cardiovascular system: S1 & S2 heard, RRR. No JVD.  2/6 holosystolic murmur, positive click.  +1-2 pitting edema bilateral lower extremity. Gastrointestinal system: Abdomen is nondistended, soft and nontender. No organomegaly or masses felt. Normal bowel sounds heard. Central nervous system: Alert and oriented. No focal neurological deficits.  Data Reviewed: I have personally reviewed following labs and imaging studies  CBC: Recent Labs  Lab 01/24/21 0205 01/25/21 0044 01/26/21 0307 01/27/21 0234 01/28/21 0119  WBC 6.4 6.9 5.3 6.5 6.0  NEUTROABS 5.2 5.7 4.2 5.2 4.7  HGB 8.1* 8.4* 7.5* 7.9* 7.5*  HCT 26.4* 27.9* 24.9* 25.5* 24.6*  MCV 99.2 101.5* 102.0* 100.8* 99.6  PLT 89* 87* 88* 87* 91*    Basic Metabolic Panel: Recent Labs  Lab 01/22/21 0515 01/23/21 0638 01/24/21 0205 01/25/21 0044 01/26/21 0307  NA 144 145 146* 149* 150*  K 3.8 3.6  3.6 3.8 3.7  CL 118* 120* 120* 122* 121*  CO2 22 21* 23 23 25   GLUCOSE 130* 120* 131* 165* 191*  BUN 56* 52* 50* 50* 49*  CREATININE 1.16* 1.24* 1.23* 1.31* 1.14*  CALCIUM 8.9 8.9 8.9 9.1 8.9    GFR: Estimated Creatinine Clearance: 36.6 mL/min (A) (by C-G formula based on SCr of 1.14 mg/dL (H)). Liver Function Tests: Recent Labs  Lab 01/22/21 0515 01/23/21 1025 01/24/21 0205 01/25/21 0044 01/26/21 0307  AST 91* 80* 62* 60* 53*  ALT 102* 95* 80* 79* 61*  ALKPHOS 106 118 111 136* 145*  BILITOT 0.6 1.1 0.8 0.4 <0.1*  PROT 4.6* 4.7* 4.6* 4.9* 4.5*  ALBUMIN 1.5* 1.5* 1.4* 1.5* 1.4*    No results for input(s): LIPASE, AMYLASE in the last 168 hours.  No results  for input(s): AMMONIA in the last 168 hours.  Coagulation Profile: Recent Labs  Lab 01/23/21 0638  INR 1.4*    Cardiac Enzymes: No results for input(s): CKTOTAL, CKMB, CKMBINDEX, TROPONINI in the last 168 hours. BNP (last 3 results) No results for input(s): PROBNP in the last 8760 hours. HbA1C: Recent Labs    01/26/21 0307  HGBA1C 5.7*    CBG: Recent Labs  Lab 01/27/21 2025 01/28/21 0014 01/28/21 0407 01/28/21 0735 01/28/21 1119  GLUCAP 158* 144* 120* 112* 117*    Lipid Profile: No results for input(s): CHOL, HDL, LDLCALC, TRIG, CHOLHDL, LDLDIRECT in the last 72 hours. Thyroid Function Tests: No results for input(s): TSH, T4TOTAL, FREET4, T3FREE, THYROIDAB in the last 72 hours. Anemia Panel: No results for input(s): VITAMINB12, FOLATE, FERRITIN, TIBC, IRON, RETICCTPCT in the last 72 hours.  Sepsis Labs: No results for input(s): PROCALCITON, LATICACIDVEN in the last 168 hours.  Recent Results (from the past 240 hour(s))  Resp Panel by RT-PCR (Flu A&B, Covid) Nasopharyngeal Swab     Status: None   Collection Time: 01/18/21  6:27 PM   Specimen: Nasopharyngeal Swab; Nasopharyngeal(NP) swabs in vial transport medium  Result Value Ref Range Status   SARS Coronavirus 2 by RT PCR NEGATIVE NEGATIVE Final    Comment: (NOTE) SARS-CoV-2 target nucleic acids are NOT DETECTED.  The SARS-CoV-2 RNA is generally detectable in upper respiratory specimens during the acute phase of infection. The lowest concentration of SARS-CoV-2 viral copies this assay can detect is 138 copies/mL. A negative result does not preclude SARS-Cov-2 infection and should not be used as the sole basis for treatment or other patient management decisions. A negative result may occur with  improper specimen collection/handling, submission of specimen other than nasopharyngeal swab, presence of viral mutation(s) within the areas targeted by this assay, and inadequate number of viral copies(<138  copies/mL). A negative result must be combined with clinical observations, patient history, and epidemiological information. The expected result is Negative.  Fact Sheet for Patients:  EntrepreneurPulse.com.au  Fact Sheet for Healthcare Providers:  IncredibleEmployment.be  This test is no t yet approved or cleared by the [place] FDA and  has been authorized for detection and/or diagnosis of SARS-CoV-2 by FDA under an Emergency Use Authorization (EUA). This EUA will remain  in effect (meaning this test can be used) for the duration of the COVID-19 declaration under Section 564(b)(1) of the Act, 21 U.S.C.section 360bbb-3(b)(1), unless the authorization is terminated  or revoked sooner.       Influenza A by PCR NEGATIVE NEGATIVE Final   Influenza B by PCR NEGATIVE NEGATIVE Final    Comment: (NOTE) The Xpert Xpress SARS-CoV-2/FLU/RSV plus assay  is intended as an aid in the diagnosis of influenza from Nasopharyngeal swab specimens and should not be used as a sole basis for treatment. Nasal washings and aspirates are unacceptable for Xpert Xpress SARS-CoV-2/FLU/RSV testing.  Fact Sheet for Patients: EntrepreneurPulse.com.au  Fact Sheet for Healthcare Providers: IncredibleEmployment.be  This test is not yet approved or cleared by the [place] FDA and has been authorized for detection and/or diagnosis of SARS-CoV-2 by FDA under an Emergency Use Authorization (EUA). This EUA will remain in effect (meaning this test can be used) for the duration of the COVID-19 declaration under Section 564(b)(1) of the Act, 21 U.S.C. section 360bbb-3(b)(1), unless the authorization is terminated or revoked.  Performed at [facility], [place] [address] [place], [place] [address]   Urine Culture     Status: Abnormal   Collection Time: 01/18/21 11:10 PM   Specimen: Urine, Clean Catch  Result Value Ref Range  Status   Specimen Description URINE, CLEAN CATCH  Final   Special Requests   Final    NONE Performed at [facility], [place] [address] [place], [place] [address]    Culture 80,000 COLONIES/mL PSEUDOMONAS AERUGINOSA (A)  Final   Report Status 01/21/2021 FINAL  Final   Organism ID, Bacteria PSEUDOMONAS AERUGINOSA (A)  Final      Susceptibility   Pseudomonas aeruginosa - MIC*    CEFTAZIDIME 4 SENSITIVE Sensitive     CIPROFLOXACIN <=0.25 SENSITIVE Sensitive     GENTAMICIN 2 SENSITIVE Sensitive     IMIPENEM 2 SENSITIVE Sensitive     PIP/TAZO 8 SENSITIVE Sensitive     CEFEPIME 2 SENSITIVE Sensitive     * 80,000 COLONIES/mL PSEUDOMONAS AERUGINOSA  Culture, blood (Routine X 2) w Reflex to ID Panel     Status: Abnormal   Collection Time: 01/23/21  6:09 PM   Specimen: BLOOD  Result Value Ref Range Status   Specimen Description BLOOD RIGHT ANTECUBITAL  Final   Special Requests   Final    BOTTLES DRAWN AEROBIC AND ANAEROBIC Blood Culture adequate volume   Culture  Setup Time   Final    GRAM POSITIVE COCCI IN CHAINS IN BOTH AEROBIC AND ANAEROBIC BOTTLES Organism ID to follow CRITICAL RESULT CALLED TO, READ BACK BY AND VERIFIED WITH: Ronald Pippins [id number] MLM Performed at [facility], [place] [address] [place], [place] [address]    Culture ENTEROCOCCUS FAECALIS (A)  Final   Report Status 01/26/2021 FINAL  Final   Organism ID, Bacteria ENTEROCOCCUS FAECALIS  Final      Susceptibility   Enterococcus faecalis - MIC*    AMPICILLIN <=2 SENSITIVE Sensitive     VANCOMYCIN 1 SENSITIVE Sensitive     GENTAMICIN SYNERGY SENSITIVE Sensitive     * ENTEROCOCCUS FAECALIS  Culture, blood (Routine X 2) w Reflex to ID Panel     Status: Abnormal   Collection Time: 01/23/21  6:09 PM   Specimen: BLOOD RIGHT HAND  Result Value Ref Range Status   Specimen Description BLOOD RIGHT HAND  Final   Special Requests   Final    BOTTLES DRAWN AEROBIC AND ANAEROBIC Blood Culture adequate volume    Culture  Setup Time   Final    GRAM POSITIVE COCCI IN CHAINS IN BOTH AEROBIC AND ANAEROBIC BOTTLES CRITICAL VALUE NOTED.  VALUE IS CONSISTENT WITH PREVIOUSLY REPORTED AND CALLED VALUE.    Culture (A)  Final    ENTEROCOCCUS FAECALIS SUSCEPTIBILITIES PERFORMED ON PREVIOUS CULTURE WITHIN THE LAST  5 DAYS. Performed at [facility], [place] [address] [place], [place] [address]    Report Status 01/26/2021 FINAL  Final  Blood Culture ID Panel (Reflexed)     Status: Abnormal   Collection Time: 01/23/21  6:09 PM  Result Value Ref Range Status   Enterococcus faecalis DETECTED (A) NOT DETECTED Final    Comment: CRITICAL RESULT CALLED TO, READ BACK BY AND VERIFIED WITH: Ronald Pippins [id number] MLM    Enterococcus Faecium NOT DETECTED NOT DETECTED Final   Listeria monocytogenes NOT DETECTED NOT DETECTED Final   Staphylococcus species NOT DETECTED NOT DETECTED Final   Staphylococcus aureus (BCID) NOT DETECTED NOT DETECTED Final   Staphylococcus epidermidis NOT DETECTED NOT DETECTED Final   Staphylococcus lugdunensis NOT DETECTED NOT DETECTED Final   Streptococcus species NOT DETECTED NOT DETECTED Final   Streptococcus agalactiae NOT DETECTED NOT DETECTED Final   Streptococcus pneumoniae NOT DETECTED NOT DETECTED Final   Streptococcus pyogenes NOT DETECTED NOT DETECTED Final   A.calcoaceticus-baumannii NOT DETECTED NOT DETECTED Final   Bacteroides fragilis NOT DETECTED NOT DETECTED Final   Enterobacterales NOT DETECTED NOT DETECTED Final   Enterobacter cloacae complex NOT DETECTED NOT DETECTED Final   Escherichia coli NOT DETECTED NOT DETECTED Final   Klebsiella aerogenes NOT DETECTED NOT DETECTED Final   Klebsiella oxytoca NOT DETECTED NOT DETECTED Final   Klebsiella pneumoniae NOT DETECTED NOT DETECTED Final   Proteus species NOT DETECTED NOT DETECTED Final   Salmonella species NOT DETECTED NOT DETECTED Final   Serratia marcescens NOT DETECTED NOT DETECTED Final   Haemophilus influenzae  NOT DETECTED NOT DETECTED Final   Neisseria meningitidis NOT DETECTED NOT DETECTED Final   Pseudomonas aeruginosa NOT DETECTED NOT DETECTED Final   Stenotrophomonas maltophilia NOT DETECTED NOT DETECTED Final   Candida albicans NOT DETECTED NOT DETECTED Final   Candida auris NOT DETECTED NOT DETECTED Final   Candida glabrata NOT DETECTED NOT DETECTED Final   Candida krusei NOT DETECTED NOT DETECTED Final   Candida parapsilosis NOT DETECTED NOT DETECTED Final   Candida tropicalis NOT DETECTED NOT DETECTED Final   Cryptococcus neoformans/gattii NOT DETECTED NOT DETECTED Final   Vancomycin resistance NOT DETECTED NOT DETECTED Final    Comment: Performed at [facility], [address] [place], [place] [address]  Culture, blood (routine x 2)     Status: None (Preliminary result)   Collection Time: 01/25/21 11:21 AM   Specimen: BLOOD RIGHT HAND  Result Value Ref Range Status   Specimen Description BLOOD RIGHT HAND  Final   Special Requests   Final    BOTTLES DRAWN AEROBIC AND ANAEROBIC Blood Culture adequate volume   Culture   Final    NO GROWTH 3 DAYS Performed at [facility], [place] [address], [place],  [address]    Report Status PENDING  Incomplete  Culture, blood (routine x 2)     Status: Abnormal   Collection Time: 01/25/21 11:41 AM   Specimen: BLOOD RIGHT HAND  Result Value Ref Range Status   Specimen Description BLOOD RIGHT HAND  Final   Special Requests   Final    BOTTLES DRAWN AEROBIC ONLY Blood Culture adequate volume   Culture  Setup Time   Final    GRAM POSITIVE COCCI AEROBIC BOTTLE ONLY CRITICAL VALUE NOTED.  VALUE IS CONSISTENT WITH PREVIOUSLY REPORTED AND CALLED VALUE.    Culture (A)  Final    STAPHYLOCOCCUS EPIDERMIDIS THE SIGNIFICANCE OF ISOLATING THIS ORGANISM FROM A SINGLE SET  OF BLOOD CULTURES WHEN MULTIPLE SETS ARE DRAWN IS UNCERTAIN. PLEASE NOTIFY THE MICROBIOLOGY DEPARTMENT WITHIN ONE WEEK IF SPECIATION AND SENSITIVITIES ARE  REQUIRED. Performed at [facility], [place] [address], [place], [place] [address]    Report Status 01/27/2021 FINAL  Final    Radiology Studies: No results found.  Scheduled Meds:  sodium chloride   Intravenous Once   amiodarone  200 mg Oral Daily   apixaban  5 mg Oral BID   Chlorhexidine Gluconate Cloth  6 each Topical Daily   ezetimibe  10 mg Oral Daily   feeding supplement  237 mL Oral TID BM   ferrous sulfate  325 mg Oral Daily   hydrALAZINE  10 mg Oral Q8H   insulin aspart  0-15 Units Subcutaneous TID WC   insulin aspart  0-5 Units Subcutaneous QHS   isosorbide mononitrate  15 mg Oral Daily   multivitamin with minerals  1 tablet Oral Daily   tamsulosin  0.4 mg Oral QPC breakfast   vitamin B-12  500 mcg Oral Daily   Continuous Infusions:  ampicillin (OMNIPEN) IV 2 g (01/28/21 0600)   cefTRIAXone (ROCEPHIN)  IV 2 g (01/27/21 2131)     LOS: 9 days   Time spent: 26 minutes  Darliss Cheney, MD [facility]

## 2021-01-29 LAB — CBC WITH DIFFERENTIAL/PLATELET
Abs Immature Granulocytes: 0 10*3/uL (ref 0.00–0.07)
Basophils Absolute: 0 10*3/uL (ref 0.0–0.1)
Basophils Relative: 0 %
Eosinophils Absolute: 0.1 10*3/uL (ref 0.0–0.5)
Eosinophils Relative: 2 %
HCT: 28.5 % — ABNORMAL LOW (ref 36.0–46.0)
Hemoglobin: 8.5 g/dL — ABNORMAL LOW (ref 12.0–15.0)
Lymphocytes Relative: 2 %
Lymphs Abs: 0.1 10*3/uL — ABNORMAL LOW (ref 0.7–4.0)
MCH: 30.6 pg (ref 26.0–34.0)
MCHC: 29.8 g/dL — ABNORMAL LOW (ref 30.0–36.0)
MCV: 102.5 fL — ABNORMAL HIGH (ref 80.0–100.0)
Monocytes Absolute: 0.1 10*3/uL (ref 0.1–1.0)
Monocytes Relative: 1 %
Neutro Abs: 6.7 10*3/uL (ref 1.7–7.7)
Neutrophils Relative %: 95 %
Platelets: 93 10*3/uL — ABNORMAL LOW (ref 150–400)
RBC: 2.78 MIL/uL — ABNORMAL LOW (ref 3.87–5.11)
RDW: 17 % — ABNORMAL HIGH (ref 11.5–15.5)
Smear Review: DECREASED
WBC: 7 10*3/uL (ref 4.0–10.5)
nRBC: 0 % (ref 0.0–0.2)

## 2021-01-29 LAB — BASIC METABOLIC PANEL
Anion gap: 4 — ABNORMAL LOW (ref 5–15)
BUN: 42 mg/dL — ABNORMAL HIGH (ref 8–23)
CO2: 23 mmol/L (ref 22–32)
Calcium: 9.1 mg/dL (ref 8.9–10.3)
Chloride: 119 mmol/L — ABNORMAL HIGH (ref 98–111)
Creatinine, Ser: 1.15 mg/dL — ABNORMAL HIGH (ref 0.44–1.00)
GFR, Estimated: 48 mL/min — ABNORMAL LOW (ref >60)
Glucose, Bld: 114 mg/dL — ABNORMAL HIGH (ref 70–99)
Potassium: 4.1 mmol/L (ref 3.5–5.1)
Sodium: 146 mmol/L — ABNORMAL HIGH (ref 135–145)

## 2021-01-29 LAB — GLUCOSE, CAPILLARY
Glucose-Capillary: 107 mg/dL — ABNORMAL HIGH (ref 70–99)
Glucose-Capillary: 107 mg/dL — ABNORMAL HIGH (ref 70–99)
Glucose-Capillary: 120 mg/dL — ABNORMAL HIGH (ref 70–99)
Glucose-Capillary: 138 mg/dL — ABNORMAL HIGH (ref 70–99)
Glucose-Capillary: 158 mg/dL — ABNORMAL HIGH (ref 70–99)

## 2021-01-29 LAB — MAGNESIUM: Magnesium: 2.2 mg/dL (ref 1.7–2.4)

## 2021-01-29 MED ORDER — SODIUM CHLORIDE 0.9 % IV SOLN
INTRAVENOUS | Status: DC | PRN
Start: 2021-01-29 — End: 2021-02-01
  Administered 2021-01-29: 500 mL via INTRAVENOUS

## 2021-01-29 MED ORDER — ACETAMINOPHEN 325 MG PO TABS
650.0000 mg | ORAL_TABLET | Freq: Four times a day (QID) | ORAL | Status: DC | PRN
  Administered 2021-01-29: 650 mg via ORAL
  Filled 2021-01-29: qty 2

## 2021-01-29 NOTE — Progress Notes (Signed)
PROGRESS NOTE    Carrie Monroe  ACZ:[id number] DOB: 09/17/1935 DOA: 01/18/2021 PCP: Ginger Organ., MD    Brief Narrative:  85 year old female with history of congestive heart failure, coronary artery disease status post stenting, atrial fibrillation tachybradycardia syndrome, diabetes, chronic kidney disease and anemia recently admitted to the hospital with weakness and pseudogout of the knee was discharged to a skilled nursing rehab 9 days ago brought back to the emergency room with elevated creatinine on routine exam at the skilled nursing facility.  Patient is poor historian.  Patient's daughter tells me that she had been looking weak and tired however she has not noticed anything new.  In the emergency room CT scan abdomen pelvis showed prominent distention of the bladder with bilateral hydronephrosis and hydroureter, changes of hepatic cirrhosis with prominent splenic vein varices and splenomegaly.  Creatinine 2.3 and BUN 137.  A Foley catheter was placed.  Admitted for further work-up.  Recent hospitalization 01/02/2021--01/09/2021 with severe fatigue generalized weakness-found to have acute superimposed on chronic diastolic heart failure as well as decompensated pulmonary hypertension--at that admission palliative care consulted  Assessment & Plan:   Principal Problem:   ARF (acute renal failure) ([place]) Active Problems:   Essential hypertension   CAD- severe 3V CAD    Three-vessel CAD-PCI to LAD and LCx, med management of RCA   S/P TAVR (transcatheter aortic valve replacement)   Aortic stenosis, severe   Goals of care, counseling/discussion   Persistent atrial fibrillation (HCC)   AKI (acute kidney injury) ([place])   Acute encephalopathy   Subacute bacterial endocarditis   Cirrhosis of liver without ascites ([place])   Obstructive uropathy  Acute kidney injury on chronic kidney disease stage 3a: Baseline creatinine about 1.5.  Presented with creatinine of 2.35.  CT abdomen at  admission showed prominent urinary bladder with bilateral hydronephrosis and hydroureter, probably postobstructive and dehydration.  Renal function now at baseline now.  Continue and Discharge with Foley catheter until patient has good clinical improvement of her mobility.  Continue Flomax.  Acute metabolic encephalopathy in a patient with multiple medical problems: CT head unremarkable.  Likely metabolic from uremia and UTI.  Urea 135 on presentation.  Trending down.  Ammonia is normal. B 12 and folic acid is normal.  Continues to have intermittent confusion, likely secondary to bacterial endocarditis.  However today, she is alert and oriented x3.  Denies any complaint.  Enterococcus bacteremia/bacterial endocarditis: Transthoracic echo shows large mitral valve vegetation/endocarditis.  Blood cultures are now growing Enterococcus faecalis.  ID consulted patient has been started on Rocephin and ampicillin.  We have consulted ID.  Patient seen by CT surgery.  Due to multiple comorbidities, she is deemed to be a poor surgical candidate so CT surgery did not recommend any surgery.  ID recommends 6 weeks of IV antibiotics.  Palliative care on board leading the communication with the family.  Family is open to considering comfort care if patient were to go [facility] however at this point in time, they would like to continue antibiotics and pursue rehab if possible.  Family would like to wait until Monday to see how she does and have another meeting with the palliative care before making a final decision about rehab.  TOC informed.  UTI: Urinalysis with more than 80,000 Pseudomonas, received cefepime for 5 days, ended on 01/22/2021.  Elevated LFTs with CT scan concerning for hepatic cirrhosis: Hepatitis panel negative.  Mild abnormal LFTs, cardiology discontinued statin and started her on Zetia.  Patient  also on amiodarone.  Per cardiology, this needs to be continued as she does not have any other options for her  atrial fibrillation.  LFTs improving.  Prediabetes: Hemoglobin A1c 5.7.  Continue SSI.  Permanent A. fib on amiodarone and Eliquis: Continue amiodarone.  Therapeutic on Eliquis.  Repeat EKG consistent with chronic left bundle branch block pattern.   Appreciate their help.  Chronic combined heart failure: Without exacerbation.  Euvolemic other than some bilateral lower extremity edema but no pulmonary edema.  Received IV fluids.  Cardiology was consulted for this.  She received 1 dose of Lasix on 01/22/2021.  Renal function is stable.  No further diuretics planned by cardiology.  She is on Imdur and hydralazine.  Not a candidate for ACE/ARB/ARNI/MRA given CKD appreciate their help.  Essential hypertension: Blood pressure controlled.  Continue hydralazine and Imdur.  Hyperlipidemia: Crestor was switched to Zetia by cardiology on 01/23/2021 due to elevated LFTs and the need for continuation of amiodarone in order to protect liver  Debility: PT OT recommends SNF.  TOC on board for placement   DVT prophylaxis:  apixaban (ELIQUIS) tablet 5 mg   Code Status: DNR Family Communication: None at bedside.   Palliative care leading communications with the family.  They plan to touch base with the family tomorrow and come up with a final plan. Disposition Plan: Status is: Inpatient.   Dispo: The patient is from: SNF              Anticipated d/c is to: SNF              Patient currently is medically stable.   Difficult to place patient No    Consultants:  None  Procedures:  None  Antimicrobials:  Rocephin 8/24--- 8/25 Cefepime 8/26--01/22/2021 Rocephin 01/24/2021> Ampicillin 01/24/2021>   Subjective: Patient seen and examined.  She complains of swelling in the feet.  She is more alert and oriented x3 today.   Objective: Vitals:   01/29/21 0406 01/29/21 0800 01/29/21 0805 01/29/21 0850  BP: (!) 121/49  (!) 109/27 (!) 127/47  Pulse: 80   73  Resp: (!) 23   18  Temp: 98 F (36.7 C) 97.9  F (36.6 C)    TempSrc: Oral     SpO2: 97%   97%  Weight:      Height:       No intake or output data in the 24 hours ending 01/29/21 1129  Filed Weights   01/18/21 1747 01/23/21 1239  Weight: 77.4 kg 77.2 kg    Examination: General exam: Appears calm and comfortable  Respiratory system: Clear to auscultation. Respiratory effort normal. Cardiovascular system: S1 & S2 heard, RRR. No JVD.  2/6 holosystolic murmur, positive click +1 pitting edema bilateral lower extremity Gastrointestinal system: Abdomen is nondistended, soft and nontender. No organomegaly or masses felt. Normal bowel sounds heard. Central nervous system: Alert and oriented. No focal neurological deficits. Extremities: Symmetric 5 x 5 power. Skin: No rashes, lesions or ulcers.   Data Reviewed: I have personally reviewed following labs and imaging studies  CBC: Recent Labs  Lab 01/25/21 0044 01/26/21 0307 01/27/21 0234 01/28/21 0119 01/29/21 0202  WBC 6.9 5.3 6.5 6.0 7.0  NEUTROABS 5.7 4.2 5.2 4.7 6.7  HGB 8.4* 7.5* 7.9* 7.5* 8.5*  HCT 27.9* 24.9* 25.5* 24.6* 28.5*  MCV 101.5* 102.0* 100.8* 99.6 102.5*  PLT 87* 88* 87* 91* 93*    Basic Metabolic Panel: Recent Labs  Lab 01/23/21 0638 01/24/21 0205 01/25/21 0044  01/26/21 0307 01/29/21 0202  NA 145 146* 149* 150* 146*  K 3.6 3.6 3.8 3.7 4.1  CL 120* 120* 122* 121* 119*  CO2 21* 23 23 25 23   GLUCOSE 120* 131* 165* 191* 114*  BUN 52* 50* 50* 49* 42*  CREATININE 1.24* 1.23* 1.31* 1.14* 1.15*  CALCIUM 8.9 8.9 9.1 8.9 9.1  MG  --   --   --   --  2.2    GFR: Estimated Creatinine Clearance: 36.3 mL/min (A) (by C-G formula based on SCr of 1.15 mg/dL (H)). Liver Function Tests: Recent Labs  Lab 01/23/21 0093 01/24/21 0205 01/25/21 0044 01/26/21 0307  AST 80* 62* 60* 53*  ALT 95* 80* 79* 61*  ALKPHOS 118 111 136* 145*  BILITOT 1.1 0.8 0.4 <0.1*  PROT 4.7* 4.6* 4.9* 4.5*  ALBUMIN 1.5* 1.4* 1.5* 1.4*    No results for input(s): LIPASE,  AMYLASE in the last 168 hours.  No results for input(s): AMMONIA in the last 168 hours.  Coagulation Profile: Recent Labs  Lab 01/23/21 0638  INR 1.4*    Cardiac Enzymes: No results for input(s): CKTOTAL, CKMB, CKMBINDEX, TROPONINI in the last 168 hours. BNP (last 3 results) No results for input(s): PROBNP in the last 8760 hours. HbA1C: No results for input(s): HGBA1C in the last 72 hours.  CBG: Recent Labs  Lab 01/28/21 1616 01/28/21 2029 01/29/21 0023 01/29/21 0401 01/29/21 0753  GLUCAP 112* 112* 120* 107* 107*    Lipid Profile: No results for input(s): CHOL, HDL, LDLCALC, TRIG, CHOLHDL, LDLDIRECT in the last 72 hours. Thyroid Function Tests: No results for input(s): TSH, T4TOTAL, FREET4, T3FREE, THYROIDAB in the last 72 hours. Anemia Panel: No results for input(s): VITAMINB12, FOLATE, FERRITIN, TIBC, IRON, RETICCTPCT in the last 72 hours.  Sepsis Labs: No results for input(s): PROCALCITON, LATICACIDVEN in the last 168 hours.  Recent Results (from the past 240 hour(s))  Culture, blood (Routine X 2) w Reflex to ID Panel     Status: Abnormal   Collection Time: 01/23/21  6:09 PM   Specimen: BLOOD  Result Value Ref Range Status   Specimen Description BLOOD RIGHT ANTECUBITAL  Final   Special Requests   Final    BOTTLES DRAWN AEROBIC AND ANAEROBIC Blood Culture adequate volume   Culture  Setup Time   Final    GRAM POSITIVE COCCI IN CHAINS IN BOTH AEROBIC AND ANAEROBIC BOTTLES Organism ID to follow CRITICAL RESULT CALLED TO, READ BACK BY AND VERIFIED WITH: Ronald Pippins [id number] Performed at [facility], [place] [address], [place], [place] [address]    Culture ENTEROCOCCUS FAECALIS (A)  Final   Report Status 01/26/2021 FINAL  Final   Organism ID, Bacteria ENTEROCOCCUS FAECALIS  Final      Susceptibility   Enterococcus faecalis - MIC*    AMPICILLIN <=2 SENSITIVE Sensitive     VANCOMYCIN 1 SENSITIVE Sensitive     GENTAMICIN SYNERGY SENSITIVE  Sensitive     * ENTEROCOCCUS FAECALIS  Culture, blood (Routine X 2) w Reflex to ID Panel     Status: Abnormal   Collection Time: 01/23/21  6:09 PM   Specimen: BLOOD RIGHT HAND  Result Value Ref Range Status   Specimen Description BLOOD RIGHT HAND  Final   Special Requests   Final    BOTTLES DRAWN AEROBIC AND ANAEROBIC Blood Culture adequate volume   Culture  Setup Time   Final    GRAM POSITIVE COCCI IN CHAINS IN BOTH AEROBIC AND  ANAEROBIC BOTTLES CRITICAL VALUE NOTED.  VALUE IS CONSISTENT WITH PREVIOUSLY REPORTED AND CALLED VALUE.    Culture (A)  Final    ENTEROCOCCUS FAECALIS SUSCEPTIBILITIES PERFORMED ON PREVIOUS CULTURE WITHIN THE LAST 5 DAYS. Performed at [facility], [place] [address], [place], [place] [address]    Report Status 01/26/2021 FINAL  Final  Blood Culture ID Panel (Reflexed)     Status: Abnormal   Collection Time: 01/23/21  6:09 PM  Result Value Ref Range Status   Enterococcus faecalis DETECTED (A) NOT DETECTED Final    Comment: CRITICAL RESULT CALLED TO, READ BACK BY AND VERIFIED WITH: Ronald Pippins [id number]    Enterococcus Faecium NOT DETECTED NOT DETECTED Final   Listeria monocytogenes NOT DETECTED NOT DETECTED Final   Staphylococcus species NOT DETECTED NOT DETECTED Final   Staphylococcus aureus (BCID) NOT DETECTED NOT DETECTED Final   Staphylococcus epidermidis NOT DETECTED NOT DETECTED Final   Staphylococcus lugdunensis NOT DETECTED NOT DETECTED Final   Streptococcus species NOT DETECTED NOT DETECTED Final   Streptococcus agalactiae NOT DETECTED NOT DETECTED Final   Streptococcus pneumoniae NOT DETECTED NOT DETECTED Final   Streptococcus pyogenes NOT DETECTED NOT DETECTED Final   A.calcoaceticus-baumannii NOT DETECTED NOT DETECTED Final   Bacteroides fragilis NOT DETECTED NOT DETECTED Final   Enterobacterales NOT DETECTED NOT DETECTED Final   Enterobacter cloacae complex NOT DETECTED NOT DETECTED Final   Escherichia coli NOT DETECTED NOT  DETECTED Final   Klebsiella aerogenes NOT DETECTED NOT DETECTED Final   Klebsiella oxytoca NOT DETECTED NOT DETECTED Final   Klebsiella pneumoniae NOT DETECTED NOT DETECTED Final   Proteus species NOT DETECTED NOT DETECTED Final   Salmonella species NOT DETECTED NOT DETECTED Final   Serratia marcescens NOT DETECTED NOT DETECTED Final   Haemophilus influenzae NOT DETECTED NOT DETECTED Final   Neisseria meningitidis NOT DETECTED NOT DETECTED Final   Pseudomonas aeruginosa NOT DETECTED NOT DETECTED Final   Stenotrophomonas maltophilia NOT DETECTED NOT DETECTED Final   Candida albicans NOT DETECTED NOT DETECTED Final   Candida auris NOT DETECTED NOT DETECTED Final   Candida glabrata NOT DETECTED NOT DETECTED Final   Candida krusei NOT DETECTED NOT DETECTED Final   Candida parapsilosis NOT DETECTED NOT DETECTED Final   Candida tropicalis NOT DETECTED NOT DETECTED Final   Cryptococcus neoformans/gattii NOT DETECTED NOT DETECTED Final   Vancomycin resistance NOT DETECTED NOT DETECTED Final    Comment: Performed at [facility], [address], [place], [place] [address]  Culture, blood (routine x 2)     Status: None (Preliminary result)   Collection Time: 01/25/21 11:21 AM   Specimen: BLOOD RIGHT HAND  Result Value Ref Range Status   Specimen Description BLOOD RIGHT HAND  Final   Special Requests   Final    BOTTLES DRAWN AEROBIC AND ANAEROBIC Blood Culture adequate volume   Culture   Final    NO GROWTH 4 DAYS Performed at [facility], [place] [address], [place], [place] [address]    Report Status PENDING  Incomplete  Culture, blood (routine x 2)     Status: Abnormal   Collection Time: 01/25/21 11:41 AM   Specimen: BLOOD RIGHT HAND  Result Value Ref Range Status   Specimen Description BLOOD RIGHT HAND  Final   Special Requests   Final    BOTTLES DRAWN AEROBIC ONLY Blood Culture adequate volume   Culture  Setup Time   Final    GRAM POSITIVE COCCI AEROBIC BOTTLE  ONLY CRITICAL VALUE NOTED.  VALUE IS CONSISTENT WITH PREVIOUSLY REPORTED AND CALLED VALUE.    Culture (A)  Final    STAPHYLOCOCCUS EPIDERMIDIS THE SIGNIFICANCE OF ISOLATING THIS ORGANISM FROM A SINGLE SET OF BLOOD CULTURES WHEN MULTIPLE SETS ARE DRAWN IS UNCERTAIN. PLEASE NOTIFY THE MICROBIOLOGY DEPARTMENT WITHIN ONE WEEK IF SPECIATION AND SENSITIVITIES ARE REQUIRED. Performed at [facility], [place] [address], [place], [place] [address]    Report Status 01/27/2021 FINAL  Final    Radiology Studies: No results found.  Scheduled Meds:  sodium chloride   Intravenous Once   amiodarone  200 mg Oral Daily   apixaban  5 mg Oral BID   Chlorhexidine Gluconate Cloth  6 each Topical Daily   ezetimibe  10 mg Oral Daily   feeding supplement  237 mL Oral TID BM   ferrous sulfate  325 mg Oral Daily   hydrALAZINE  10 mg Oral Q8H   insulin aspart  0-15 Units Subcutaneous TID WC   insulin aspart  0-5 Units Subcutaneous QHS   isosorbide mononitrate  15 mg Oral Daily   multivitamin with minerals  1 tablet Oral Daily   tamsulosin  0.4 mg Oral QPC breakfast   vitamin B-12  500 mcg Oral Daily   Continuous Infusions:  sodium chloride 500 mL (01/29/21 1054)   ampicillin (OMNIPEN) IV 2 g (01/29/21 0651)   cefTRIAXone (ROCEPHIN)  IV 2 g (01/29/21 1058)     LOS: 10 days   Time spent: 25 minutes  Darliss Cheney, MD [facility]

## 2021-01-29 NOTE — Progress Notes (Signed)
Carrie Monroe is refusing her 12pm CBG check

## 2021-01-30 DIAGNOSIS — L98421 Non-pressure chronic ulcer of back limited to breakdown of skin: Secondary | ICD-10-CM

## 2021-01-30 DIAGNOSIS — I350 Nonrheumatic aortic (valve) stenosis: Secondary | ICD-10-CM

## 2021-01-30 DIAGNOSIS — Z66 Do not resuscitate: Secondary | ICD-10-CM

## 2021-01-30 LAB — CBC WITH DIFFERENTIAL/PLATELET
Abs Immature Granulocytes: 0.03 10*3/uL (ref 0.00–0.07)
Basophils Absolute: 0 10*3/uL (ref 0.0–0.1)
Basophils Relative: 1 %
Eosinophils Absolute: 0.2 10*3/uL (ref 0.0–0.5)
Eosinophils Relative: 3 %
HCT: 24.9 % — ABNORMAL LOW (ref 36.0–46.0)
Hemoglobin: 7.4 g/dL — ABNORMAL LOW (ref 12.0–15.0)
Immature Granulocytes: 1 %
Lymphocytes Relative: 11 %
Lymphs Abs: 0.7 10*3/uL (ref 0.7–4.0)
MCH: 30.3 pg (ref 26.0–34.0)
MCHC: 29.7 g/dL — ABNORMAL LOW (ref 30.0–36.0)
MCV: 102 fL — ABNORMAL HIGH (ref 80.0–100.0)
Monocytes Absolute: 0.3 10*3/uL (ref 0.1–1.0)
Monocytes Relative: 5 %
Neutro Abs: 5.2 10*3/uL (ref 1.7–7.7)
Neutrophils Relative %: 79 %
Platelets: 102 10*3/uL — ABNORMAL LOW (ref 150–400)
RBC: 2.44 MIL/uL — ABNORMAL LOW (ref 3.87–5.11)
RDW: 17.2 % — ABNORMAL HIGH (ref 11.5–15.5)
WBC: 6.5 10*3/uL (ref 4.0–10.5)
nRBC: 0 % (ref 0.0–0.2)

## 2021-01-30 LAB — GLUCOSE, CAPILLARY
Glucose-Capillary: 100 mg/dL — ABNORMAL HIGH (ref 70–99)
Glucose-Capillary: 126 mg/dL — ABNORMAL HIGH (ref 70–99)
Glucose-Capillary: 143 mg/dL — ABNORMAL HIGH (ref 70–99)
Glucose-Capillary: 145 mg/dL — ABNORMAL HIGH (ref 70–99)
Glucose-Capillary: 181 mg/dL — ABNORMAL HIGH (ref 70–99)

## 2021-01-30 LAB — CULTURE, BLOOD (ROUTINE X 2)
Culture: NO GROWTH
Special Requests: ADEQUATE

## 2021-01-30 NOTE — Progress Notes (Signed)
Carrie Monroe is extremely weak today, she does not have the strength to drink through a straw and could not swallow a spoonful of applesauce with her medications in it. It took 5 minutes of prompting and reminding to get her to swallow even the 1 bite she was just holding in her mouth. She is extremely tired and minimally interactive.

## 2021-01-30 NOTE — Progress Notes (Signed)
PROGRESS NOTE    Carrie Monroe  XLK:[id number] DOB: Oct 03, 1935 DOA: 01/18/2021 PCP: Ginger Organ., MD    Brief Narrative:  85 year old female with history of congestive heart failure, coronary artery disease status post stenting, atrial fibrillation tachybradycardia syndrome, diabetes, chronic kidney disease and anemia recently admitted to the hospital with weakness and pseudogout of the knee was discharged to a skilled nursing rehab 9 days ago brought back to the emergency room with elevated creatinine on routine exam at the skilled nursing facility.  Patient is poor historian.  Patient's daughter tells me that she had been looking weak and tired however she has not noticed anything new.  In the emergency room CT scan abdomen pelvis showed prominent distention of the bladder with bilateral hydronephrosis and hydroureter, changes of hepatic cirrhosis with prominent splenic vein varices and splenomegaly.  Creatinine 2.3 and BUN 137.  A Foley catheter was placed.  Admitted for further work-up.  Recent hospitalization 01/02/2021--01/09/2021 with severe fatigue generalized weakness-found to have acute superimposed on chronic diastolic heart failure as well as decompensated pulmonary hypertension--at that admission palliative care consulted  Assessment & Plan:   Principal Problem:   ARF (acute renal failure) ([place]) Active Problems:   Essential hypertension   CAD- severe 3V CAD    Three-vessel CAD-PCI to LAD and LCx, med management of RCA   S/P TAVR (transcatheter aortic valve replacement)   Aortic stenosis, severe   Goals of care, counseling/discussion   Persistent atrial fibrillation (HCC)   AKI (acute kidney injury) ([place])   Acute encephalopathy   Subacute bacterial endocarditis   Cirrhosis of liver without ascites ([place])   Obstructive uropathy  Acute kidney injury on chronic kidney disease stage 3a: Baseline creatinine about 1.5.  Presented with creatinine of 2.35.  CT abdomen at  admission showed prominent urinary bladder with bilateral hydronephrosis and hydroureter, probably postobstructive and dehydration.  Renal function now at baseline now.  Continue and Discharge with Foley catheter until patient has good clinical improvement of her mobility.  Continue Flomax.  Acute metabolic encephalopathy in a patient with multiple medical problems: CT head unremarkable.  Likely metabolic from uremia and UTI.  Urea 135 on presentation.  Trending down.  Ammonia is normal. B 12 and folic acid is normal.  Continues to have intermittent confusion, likely secondary to bacterial endocarditis.  Patient is too lethargic to hold any conversation or even eat a spoonful of applesauce today.  Enterococcus bacteremia/bacterial endocarditis: Transthoracic echo shows large mitral valve vegetation/endocarditis.  Blood cultures are now growing Enterococcus faecalis.  ID consulted patient has been started on Rocephin and ampicillin.  We have consulted ID.  Patient seen by CT surgery.  Due to multiple comorbidities, she is deemed to be a poor surgical candidate so CT surgery did not recommend any surgery.  ID recommends 6 weeks of IV antibiotics.  Palliative care on board leading the communication with the family.  Family is open to considering comfort care if patient were to go [place] however at this point in time, they would like to continue antibiotics and pursue rehab if possible.  Family would like to wait until Monday to see how she does and have another meeting with the palliative care before making a final decision about rehab.  TOC informed.  UTI: Urinalysis with more than 80,000 Pseudomonas, received cefepime for 5 days, ended on 01/22/2021.  Elevated LFTs with CT scan concerning for hepatic cirrhosis: Hepatitis panel negative.  Mild abnormal LFTs, cardiology discontinued statin and started her  on Zetia.  Patient also on amiodarone.  Per cardiology, this needs to be continued as she does not have any  other options for her atrial fibrillation.  LFTs improving.  Prediabetes: Hemoglobin A1c 5.7.  Continue SSI.  Permanent A. fib on amiodarone and Eliquis: Continue amiodarone.  Therapeutic on Eliquis.  Repeat EKG consistent with chronic left bundle branch block pattern.   Appreciate their help.  Chronic combined heart failure: Without exacerbation.  Euvolemic other than some bilateral lower extremity edema but no pulmonary edema.  Received IV fluids.  Cardiology was consulted for this.  She received 1 dose of Lasix on 01/22/2021.  Renal function is stable.  No further diuretics planned by cardiology.  She is on Imdur and hydralazine.  Not a candidate for ACE/ARB/ARNI/MRA given CKD appreciate their help.  Essential hypertension: Blood pressure controlled.  Continue hydralazine and Imdur.  Hyperlipidemia: Crestor was switched to Zetia by cardiology on 01/23/2021 due to elevated LFTs and the need for continuation of amiodarone in order to protect liver  Debility: PT OT recommends SNF.  TOC on board for placement  GOC: Palliative has been on board and having communications with the family.  Per their meeting with family last Friday, family wanted to give her until Monday to see how she does before they make any decision towards pursuing SNF or comfort care.  Per recent update from palliative care today, family is now talking among themselves and will make a final decision today.  TOC on the standby.  DVT prophylaxis:  apixaban (ELIQUIS) tablet 5 mg   Code Status: DNR Family Communication: None at bedside.   Palliative care leading communications with the family.  They plan to touch base with the family tomorrow and come up with a final plan. Disposition Plan: Status is: Inpatient.   Dispo: The patient is from: SNF              Anticipated d/c is to: SNF              Patient currently is medically stable.   Difficult to place patient No    Consultants:  None  Procedures:   None  Antimicrobials:  Rocephin 8/24--- 8/25 Cefepime 8/26--01/22/2021 Rocephin 01/24/2021> Ampicillin 01/24/2021>   Subjective: Patient seen and examined today.  She is too lethargic to hold any conversation.  Per nurses, she was too lethargic to have been eat a spoonful of applesauce today.  Objective: Vitals:   01/29/21 1400 01/29/21 1923 01/30/21 0420 01/30/21 1253  BP: (!) 127/49 (!) 112/41 113/60 (!) 140/57  Pulse: 74 80 83 73  Resp: 20 (!) 22 (!) 21 (!) 22  Temp: 99 F (37.2 C) 99.9 F (37.7 C) 99 F (37.2 C) 98.1 F (36.7 C)  TempSrc: Axillary Axillary Axillary Axillary  SpO2: 97% 95% 95%   Weight:   82 kg   Height:        Intake/Output Summary (Last 24 hours) at 01/30/2021 1302 Last data filed at 01/30/2021 0411 Gross per 24 hour  Intake 1630.34 ml  Output 825 ml  Net 805.34 ml    Filed Weights   01/18/21 1747 01/23/21 1239 01/30/21 0420  Weight: 77.4 kg 77.2 kg 82 kg    Examination: General exam: Appears very lethargic Respiratory system: Clear to auscultation. Respiratory effort normal. Cardiovascular system: S1 & S2 heard, RRR. No JVD, murmurs, rubs, gallops or clicks. No pedal edema. Gastrointestinal system: Abdomen is nondistended, soft and nontender. No organomegaly or masses felt. Normal bowel sounds heard.  Central nervous system: Lethargic.  Not following commands.  Data Reviewed: I have personally reviewed following labs and imaging studies  CBC: Recent Labs  Lab 01/26/21 0307 01/27/21 0234 01/28/21 0119 01/29/21 0202 01/30/21 0059  WBC 5.3 6.5 6.0 7.0 6.5  NEUTROABS 4.2 5.2 4.7 6.7 5.2  HGB 7.5* 7.9* 7.5* 8.5* 7.4*  HCT 24.9* 25.5* 24.6* 28.5* 24.9*  MCV 102.0* 100.8* 99.6 102.5* 102.0*  PLT 88* 87* 91* 93* 102*    Basic Metabolic Panel: Recent Labs  Lab 01/24/21 0205 01/25/21 0044 01/26/21 0307 01/29/21 0202  NA 146* 149* 150* 146*  K 3.6 3.8 3.7 4.1  CL 120* 122* 121* 119*  CO2 23 23 25 23   GLUCOSE 131* 165* 191* 114*  BUN  50* 50* 49* 42*  CREATININE 1.23* 1.31* 1.14* 1.15*  CALCIUM 8.9 9.1 8.9 9.1  MG  --   --   --  2.2    GFR: Estimated Creatinine Clearance: 37.5 mL/min (A) (by C-G formula based on SCr of 1.15 mg/dL (H)). Liver Function Tests: Recent Labs  Lab 01/24/21 0205 01/25/21 0044 01/26/21 0307  AST 62* 60* 53*  ALT 80* 79* 61*  ALKPHOS 111 136* 145*  BILITOT 0.8 0.4 <0.1*  PROT 4.6* 4.9* 4.5*  ALBUMIN 1.4* 1.5* 1.4*    No results for input(s): LIPASE, AMYLASE in the last 168 hours.  No results for input(s): AMMONIA in the last 168 hours.  Coagulation Profile: No results for input(s): INR, PROTIME in the last 168 hours.  Cardiac Enzymes: No results for input(s): CKTOTAL, CKMB, CKMBINDEX, TROPONINI in the last 168 hours. BNP (last 3 results) No results for input(s): PROBNP in the last 8760 hours. HbA1C: No results for input(s): HGBA1C in the last 72 hours.  CBG: Recent Labs  Lab 01/29/21 2033 01/30/21 0007 01/30/21 0407 01/30/21 0744 01/30/21 1200  GLUCAP 158* 181* 145* 143* 126*    Lipid Profile: No results for input(s): CHOL, HDL, LDLCALC, TRIG, CHOLHDL, LDLDIRECT in the last 72 hours. Thyroid Function Tests: No results for input(s): TSH, T4TOTAL, FREET4, T3FREE, THYROIDAB in the last 72 hours. Anemia Panel: No results for input(s): VITAMINB12, FOLATE, FERRITIN, TIBC, IRON, RETICCTPCT in the last 72 hours.  Sepsis Labs: No results for input(s): PROCALCITON, LATICACIDVEN in the last 168 hours.  Recent Results (from the past 240 hour(s))  Culture, blood (Routine X 2) w Reflex to ID Panel     Status: Abnormal   Collection Time: 01/23/21  6:09 PM   Specimen: BLOOD  Result Value Ref Range Status   Specimen Description BLOOD RIGHT ANTECUBITAL  Final   Special Requests   Final    BOTTLES DRAWN AEROBIC AND ANAEROBIC Blood Culture adequate volume   Culture  Setup Time   Final    GRAM POSITIVE COCCI IN CHAINS IN BOTH AEROBIC AND ANAEROBIC BOTTLES Organism ID to  follow CRITICAL RESULT CALLED TO, READ BACK BY AND VERIFIED WITH: Ronald Pippins [id number] MLM Performed at [facility], [place] [address] [place], [place] [address]    Culture ENTEROCOCCUS FAECALIS (A)  Final   Report Status 01/26/2021 FINAL  Final   Organism ID, Bacteria ENTEROCOCCUS FAECALIS  Final      Susceptibility   Enterococcus faecalis - MIC*    AMPICILLIN <=2 SENSITIVE Sensitive     VANCOMYCIN 1 SENSITIVE Sensitive     GENTAMICIN SYNERGY SENSITIVE Sensitive     * ENTEROCOCCUS FAECALIS  Culture, blood (Routine X 2) w Reflex to ID Panel  Status: Abnormal   Collection Time: 01/23/21  6:09 PM   Specimen: BLOOD RIGHT HAND  Result Value Ref Range Status   Specimen Description BLOOD RIGHT HAND  Final   Special Requests   Final    BOTTLES DRAWN AEROBIC AND ANAEROBIC Blood Culture adequate volume   Culture  Setup Time   Final    GRAM POSITIVE COCCI IN CHAINS IN BOTH AEROBIC AND ANAEROBIC BOTTLES CRITICAL VALUE NOTED.  VALUE IS CONSISTENT WITH PREVIOUSLY REPORTED AND CALLED VALUE.    Culture (A)  Final    ENTEROCOCCUS FAECALIS SUSCEPTIBILITIES PERFORMED ON PREVIOUS CULTURE WITHIN THE LAST 5 DAYS. Performed at [facility], [place] [address], [place], [place] [address]    Report Status 01/26/2021 FINAL  Final  Blood Culture ID Panel (Reflexed)     Status: Abnormal   Collection Time: 01/23/21  6:09 PM  Result Value Ref Range Status   Enterococcus faecalis DETECTED (A) NOT DETECTED Final    Comment: CRITICAL RESULT CALLED TO, READ BACK BY AND VERIFIED WITH: Ronald Pippins [id number] MLM    Enterococcus Faecium NOT DETECTED NOT DETECTED Final   Listeria monocytogenes NOT DETECTED NOT DETECTED Final   Staphylococcus species NOT DETECTED NOT DETECTED Final   Staphylococcus aureus (BCID) NOT DETECTED NOT DETECTED Final   Staphylococcus epidermidis NOT DETECTED NOT DETECTED Final   Staphylococcus lugdunensis NOT DETECTED NOT DETECTED Final   Streptococcus species NOT  DETECTED NOT DETECTED Final   Streptococcus agalactiae NOT DETECTED NOT DETECTED Final   Streptococcus pneumoniae NOT DETECTED NOT DETECTED Final   Streptococcus pyogenes NOT DETECTED NOT DETECTED Final   A.calcoaceticus-baumannii NOT DETECTED NOT DETECTED Final   Bacteroides fragilis NOT DETECTED NOT DETECTED Final   Enterobacterales NOT DETECTED NOT DETECTED Final   Enterobacter cloacae complex NOT DETECTED NOT DETECTED Final   Escherichia coli NOT DETECTED NOT DETECTED Final   Klebsiella aerogenes NOT DETECTED NOT DETECTED Final   Klebsiella oxytoca NOT DETECTED NOT DETECTED Final   Klebsiella pneumoniae NOT DETECTED NOT DETECTED Final   Proteus species NOT DETECTED NOT DETECTED Final   Salmonella species NOT DETECTED NOT DETECTED Final   Serratia marcescens NOT DETECTED NOT DETECTED Final   Haemophilus influenzae NOT DETECTED NOT DETECTED Final   Neisseria meningitidis NOT DETECTED NOT DETECTED Final   Pseudomonas aeruginosa NOT DETECTED NOT DETECTED Final   Stenotrophomonas maltophilia NOT DETECTED NOT DETECTED Final   Candida albicans NOT DETECTED NOT DETECTED Final   Candida auris NOT DETECTED NOT DETECTED Final   Candida glabrata NOT DETECTED NOT DETECTED Final   Candida krusei NOT DETECTED NOT DETECTED Final   Candida parapsilosis NOT DETECTED NOT DETECTED Final   Candida tropicalis NOT DETECTED NOT DETECTED Final   Cryptococcus neoformans/gattii NOT DETECTED NOT DETECTED Final   Vancomycin resistance NOT DETECTED NOT DETECTED Final    Comment: Performed at [facility], [address], [place], [place] [address]  Culture, blood (routine x 2)     Status: None   Collection Time: 01/25/21 11:21 AM   Specimen: BLOOD RIGHT HAND  Result Value Ref Range Status   Specimen Description BLOOD RIGHT HAND  Final   Special Requests   Final    BOTTLES DRAWN AEROBIC AND ANAEROBIC Blood Culture adequate volume   Culture   Final    NO GROWTH 5 DAYS Performed at [facility], [place] [address], [place], [place] [address]    Report Status 01/30/2021 FINAL  Final  Culture, blood (routine x  2)     Status: Abnormal   Collection Time: 01/25/21 11:41 AM   Specimen: BLOOD RIGHT HAND  Result Value Ref Range Status   Specimen Description BLOOD RIGHT HAND  Final   Special Requests   Final    BOTTLES DRAWN AEROBIC ONLY Blood Culture adequate volume   Culture  Setup Time   Final    GRAM POSITIVE COCCI AEROBIC BOTTLE ONLY CRITICAL VALUE NOTED.  VALUE IS CONSISTENT WITH PREVIOUSLY REPORTED AND CALLED VALUE.    Culture (A)  Final    STAPHYLOCOCCUS EPIDERMIDIS THE SIGNIFICANCE OF ISOLATING THIS ORGANISM FROM A SINGLE SET OF BLOOD CULTURES WHEN MULTIPLE SETS ARE DRAWN IS UNCERTAIN. PLEASE NOTIFY THE MICROBIOLOGY DEPARTMENT WITHIN ONE WEEK IF SPECIATION AND SENSITIVITIES ARE REQUIRED. Performed at [facility], [place] [address] [place], [place] [address]    Report Status 01/27/2021 FINAL  Final    Radiology Studies: No results found.  Scheduled Meds:  sodium chloride   Intravenous Once   amiodarone  200 mg Oral Daily   apixaban  5 mg Oral BID   Chlorhexidine Gluconate Cloth  6 each Topical Daily   ezetimibe  10 mg Oral Daily   feeding supplement  237 mL Oral TID BM   ferrous sulfate  325 mg Oral Daily   hydrALAZINE  10 mg Oral Q8H   insulin aspart  0-15 Units Subcutaneous TID WC   insulin aspart  0-5 Units Subcutaneous QHS   isosorbide mononitrate  15 mg Oral Daily   multivitamin with minerals  1 tablet Oral Daily   tamsulosin  0.4 mg Oral QPC breakfast   vitamin B-12  500 mcg Oral Daily   Continuous Infusions:  sodium chloride 10 mL/hr at 01/29/21 1902   ampicillin (OMNIPEN) IV 2 g (01/30/21 0522)   cefTRIAXone (ROCEPHIN)  IV 2 g (01/29/21 2134)     LOS: 11 days   Time spent: 26 minutes  Darliss Cheney, MD [facility]

## 2021-01-30 NOTE — Care Management Important Message (Signed)
Important Message  Patient Details  Name: Carrie Monroe MRN: [id number] Date of Birth: 05/21/1936   Medicare Important Message Given:  Yes     Shelda Altes 01/30/2021, 10:18 AM

## 2021-01-30 NOTE — Progress Notes (Signed)
Daily Progress Note   Patient Name: Carrie Monroe       Date: 01/30/2021 DOB: 07-27-1935  Age: 85 y.o. MRN#: [id number] Attending Physician: Darliss Cheney, MD Primary Care Physician: Ginger Organ., MD Admit Date: 01/18/2021  Reason for Consultation/Follow-up: Establishing goals of care  Subjective: Chart Reviewed. Updates Received. Patient Assessed.   Patient is lethargic. Will briefly open eyes. Unable to safely take in po nutrition/medications. Holds content in her mouth with no interest in swallowing. Will not follow commands. Breathing appears somewhat labored but with no distress.   No family at the bedside.   I spoke with patient's daughter, Carrie Monroe at length. Updates provided. I expressed concerns for patient's decline and with honest recommendations for care to focus on her comfort and end-of-life given her current condition. Daughter verbalized understanding. Appropriate questions asked.   Education provided on comfort focused care and what this would look like while hospitalized in addition to at a residential hospice facility. Family advised given patient's decline and lethargic state she is at high risk of sudden death and if they make the decision to shift focus to comfort she may have a hospital death depending on when she would be approved, stability for transfer, and bed availability for hospice.   Carrie Monroe states she would like to share updates with her family and continue ongoing discussions amongst them prior to making any final decisions. She shares her brother, [name] visited with patient over the weekend and this sounds like a change compared to when he last visited.   Acknowledged request for family discussions. I emphasized to daughter again patient is at high risk of sudden death and further decline. She verbalized understanding. Carrie Monroe states wishes to continue with current plan of care, no changes at this time until family has made final decisions.   All  questions answered and support provided.   Length of Stay: 11 days  Vital Signs: BP 113/60 (BP Location: Right Arm)   Pulse 83   Temp 99 F (37.2 C) (Axillary)   Resp (!) 21   Ht 5\' 2"  (1.575 m)   Wt 82 kg   SpO2 95%   BMI 33.07 kg/m  SpO2: SpO2: 95 % O2 Device: O2 Device: Room Air O2 Flow Rate:    Physical Exam: Lethargic, frail, ill-appearing Diminished bilaterally, tachypneic RRR            Not following commands   Palliative Care Assessment & Plan   Code Status: DNR  Goals of Care/Recommendations: Extensive discussion and updates provided to family. Concerns shared that patient continues to decline, lethargic state, poor nutrition, unable to take oral medications, and that patient is approaching end-of-life. Recommendations for comfort focused care, anticipated hospital death depending on hospice home availability, with a focus on symptom management. Education provided on comfort care. Family wishes to continue discussions amongst themselves and will call back with final decisions. Clear in understanding patient is at high risk of sudden death and further decline.  PMT will continue to support and follow.   Prognosis: POOR   Discharge Planning: To Be Determined  Thank you for allowing the Palliative Medicine Team to assist in the care of this patient.  Time Total: 50 min.   Visit consisted of counseling and education dealing with the complex and emotionally intense issues of symptom management and palliative care in the setting of serious and potentially life-threatening illness.Greater than 50%  of this time was spent counseling and coordinating care related to the above  assessment and plan.  Alda Lea, AGPCNP-BC  Palliative Medicine Team [phone]

## 2021-01-30 NOTE — TOC Progression Note (Signed)
Transition of Care [facility]) - Progression Note    Patient Details  Name: Carrie Monroe MRN: [id number] Date of Birth: [birth year]  Transition of Care [facility]) CM/SW [place], [place] Phone Number: 01/30/2021, 10:44 AM  Clinical Narrative:     CSW continues to follow for dc plan. Palliative is following. CSW will continue to follow and assist with dc planning needs.   Expected Discharge Plan: [place] Barriers to Discharge: Continued Medical Work up  Expected Discharge Plan and Services Expected Discharge Plan: [place] In-house Referral: Clinical Social Work     Living arrangements for the past 2 months: [place]                                       Social Determinants of Health (SDOH) Interventions    Readmission Risk Interventions Readmission Risk Prevention Plan 03/17/2020  Transportation Screening Complete  PCP or Specialist Appt within 3-5 Days Complete  HRI or [place] Complete  Social Work Consult for [place] Planning/Counseling Complete  Palliative Care Screening Not Applicable  Medication Review [employer]) Complete  Some recent data might be hidden

## 2021-01-30 NOTE — Progress Notes (Signed)
PT Cancellation & Discharge Note  Patient Details Name: Carrie Monroe MRN: [id number] DOB: 12/26/1935   Cancelled Treatment:    Reason Eval/Treat Not Completed: Patient declined, no reason specified;Fatigue/lethargy limiting ability to participate. RN reporting pt appears to be medically declining and will likely be going comfort care once Palliative is able to coordinate with family today. Pt declined PT to mobilize or work on ROM or strengthening this date as she is very lethargic. PT will sign off at this time. If pt becomes appropriate to participate in PT again, please re-consult PT.     [name], PT, DPT Acute Rehabilitation Services  Pager: [phone] Office: [place] 01/30/2021, 11:11 AM

## 2021-01-31 LAB — GLUCOSE, CAPILLARY
Glucose-Capillary: 125 mg/dL — ABNORMAL HIGH (ref 70–99)
Glucose-Capillary: 130 mg/dL — ABNORMAL HIGH (ref 70–99)
Glucose-Capillary: 176 mg/dL — ABNORMAL HIGH (ref 70–99)
Glucose-Capillary: 94 mg/dL (ref 70–99)
Glucose-Capillary: 95 mg/dL (ref 70–99)

## 2021-01-31 MED ORDER — BIOTENE DRY MOUTH MT LIQD: 15.0000 mL | OROMUCOSAL | Status: DC | PRN

## 2021-01-31 MED ORDER — ONDANSETRON HCL 4 MG/2ML IJ SOLN: 4.0000 mg | Freq: Four times a day (QID) | INTRAMUSCULAR | Status: DC | PRN

## 2021-01-31 MED ORDER — ALPRAZOLAM 0.25 MG PO TABS: 0.2500 mg | ORAL_TABLET | Freq: Two times a day (BID) | ORAL | Status: DC | PRN

## 2021-01-31 MED ORDER — GLYCOPYRROLATE 0.2 MG/ML IJ SOLN: 0.3000 mg | INTRAMUSCULAR | Status: DC | PRN

## 2021-01-31 MED ORDER — HALOPERIDOL LACTATE 5 MG/ML IJ SOLN: 0.5000 mg | INTRAMUSCULAR | Status: DC | PRN

## 2021-01-31 MED ORDER — POLYVINYL ALCOHOL 1.4 % OP SOLN
1.0000 [drp] | Freq: Four times a day (QID) | OPHTHALMIC | Status: DC | PRN
  Filled 2021-01-31: qty 15

## 2021-01-31 MED ORDER — BISACODYL 10 MG RE SUPP
10.0000 mg | Freq: Once | RECTAL | Status: AC
  Administered 2021-01-31: 10 mg via RECTAL
  Filled 2021-01-31: qty 1

## 2021-01-31 MED ORDER — MORPHINE SULFATE (PF) 2 MG/ML IV SOLN
1.0000 mg | INTRAVENOUS | Status: DC | PRN
  Administered 2021-02-01 – 2021-02-06 (×6): 1 mg via INTRAVENOUS
  Filled 2021-01-31 (×6): qty 1

## 2021-01-31 NOTE — Progress Notes (Signed)
Daily Progress Note   Patient Name: Carrie Monroe       Date: 01/31/2021 DOB: June 01, 1935  Age: 85 y.o. MRN#: [id number] Attending Physician: Darliss Cheney, MD Primary Care Physician: Ginger Organ., MD Admit Date: 01/18/2021  Reason for Consultation/Follow-up: Establishing goals of care  Subjective: Chart Reviewed. Updates Received. Patient Assessed.   Patient is more awake and alert today. Is able to answer all orientation questions appropriately. Appears uncomfortable however continues to decline pain medication. Weak and ill in appearance.   Patient states "I am tired, please just let me go to heaven!" I acknowledge patient's request. RN at the bedside to administer insulin/medications and patient is refusing again expressing wishes to focus on her comfort and no further medical interventions or medications. I attempted to call patient's daughter while at the bedside however unavailable. Voicemail left.   I was able to speak with daughter, [name]. Updates provided. I shared with her patient's expressed wishes during my visit with her. Daughter is tearful. [name] shares family has been in discussion and her sister Jackelyn Poling is in town and on the way to the hospital. Family will plan to make decisions regarding comfort focused care once they have visited and family is at peace with Mrs. Swartout' request and understanding of her condition.   1515: Daughter Jackelyn Poling has visited with patient and notify the medical team family would like to transition care to focus on comfort. Education has been provided to family regarding comfort care while hospitalized and in agreement.   All questions answered and support provided.   Length of Stay: 12 days  Vital Signs: BP (!) 125/40 (BP Location: Right Wrist)   Pulse 72   Temp 98.8 F (37.1 C) (Oral)   Resp (!) 22   Ht 5\' 2"  (1.575 m)   Wt 82 kg   SpO2 98%   BMI 33.07 kg/m  SpO2: SpO2: 98 % O2 Device: O2 Device: Room Air O2 Flow Rate:     Physical Exam: Awake, alert, frail, ill-appearing Diminished bilaterally, tachypneic RRR            Generalized weakness, will follow some commands, AAO x3   Palliative Care Assessment & Plan   Code Status: DNR  Goals of Care/Recommendations: Extensive discussion and updates provided to family.  Family has visited and made decisions to transition all care to focus on comfort. Education has been provided including PRN medications for comfort.  Patient would quality for hospice home. Did not discuss this with family given emotional responses and decisions for comfort. Did not want to further overwhelm family however this would be appropriatie to approach tomorrow by medical team/TOC.  Morphine PRN for pain/air hunger/comfort Robinul PRN for excessive secretions Zofran PRN for nausea Liquifilm tears PRN for dry eyes Haldol PRN for agitation/anxiety May have comfort feeding Unrestricted visitations in the setting of EOL (per policy) Oxygen PRN 2L or less for comfort. No escalation.   PMT will continue to support and follow.  Please call for urgent needs.   Prognosis: POOR   Discharge Planning: To Be Determined  Thank you for allowing the Palliative Medicine Team to assist in the care of this patient.  Time Total: 55 min.   Visit consisted of counseling and education dealing with the complex and emotionally intense issues of symptom management and palliative care in the setting of serious and potentially life-threatening illness.Greater than 50%  of this time was spent counseling and coordinating care related to the above assessment and  plan.  Alda Lea, AGPCNP-BC  Palliative Medicine Team [phone]

## 2021-01-31 NOTE — Progress Notes (Signed)
PROGRESS NOTE    Carrie Monroe  MHD:[id number] DOB: Jul 07, 1935 DOA: 01/18/2021 PCP: Ginger Organ., MD    Brief Narrative:  85 year old female with history of congestive heart failure, coronary artery disease status post stenting, atrial fibrillation tachybradycardia syndrome, diabetes, chronic kidney disease and anemia recently admitted to the hospital with weakness and pseudogout of the knee was discharged to a skilled nursing rehab 9 days ago brought back to the emergency room with elevated creatinine on routine exam at the skilled nursing facility.  Patient is poor historian.  Patient's daughter tells me that she had been looking weak and tired however she has not noticed anything new.  In the emergency room CT scan abdomen pelvis showed prominent distention of the bladder with bilateral hydronephrosis and hydroureter, changes of hepatic cirrhosis with prominent splenic vein varices and splenomegaly.  Creatinine 2.3 and BUN 137.  A Foley catheter was placed.  Admitted for further work-up.  Recent hospitalization 01/02/2021--01/09/2021 with severe fatigue generalized weakness-found to have acute superimposed on chronic diastolic heart failure as well as decompensated pulmonary hypertension--at that admission palliative care consulted  Assessment & Plan:   Principal Problem:   ARF (acute renal failure) ([place]) Active Problems:   Essential hypertension   CAD- severe 3V CAD    Three-vessel CAD-PCI to LAD and LCx, med management of RCA   S/P TAVR (transcatheter aortic valve replacement)   Aortic stenosis, severe   Goals of care, counseling/discussion   Persistent atrial fibrillation (HCC)   AKI (acute kidney injury) ([place])   Acute encephalopathy   Subacute bacterial endocarditis   Cirrhosis of liver without ascites ([place])   Obstructive uropathy  Acute kidney injury on chronic kidney disease stage 3a: Baseline creatinine about 1.5.  Presented with creatinine of 2.35.  CT abdomen at  admission showed prominent urinary bladder with bilateral hydronephrosis and hydroureter, probably postobstructive and dehydration.  Renal function now at baseline now.  Continue and Discharge with Foley catheter until patient has good clinical improvement of her mobility.  Continue Flomax.  Acute metabolic encephalopathy in a patient with multiple medical problems: CT head unremarkable.  Likely metabolic from uremia and UTI.  Urea 135 on presentation.  Trending down.  Ammonia is normal. B 12 and folic acid is normal.  Continues to have intermittent confusion, likely secondary to bacterial endocarditis.  Patient was alert today but too tired to have a good conversation with me.  Per palliative care, patient was very alert and oriented today and in fact said " I am tired of this, please let me go to heaven".   Enterococcus bacteremia/bacterial endocarditis: Transthoracic echo shows large mitral valve vegetation/endocarditis.  Blood cultures are now growing Enterococcus faecalis.  ID consulted patient has been started on Rocephin and ampicillin.  We have consulted ID.  Patient seen by CT surgery.  Due to multiple comorbidities, she is deemed to be a poor surgical candidate so CT surgery did not recommend any surgery.  ID recommends 6 weeks of IV antibiotics.  Palliative care on board leading the communication with the family.  Family is open to considering comfort care if patient were to go [place] however at this point in time, they would like to continue antibiotics and pursue rehab if possible.  Family would like to wait until Monday to see how she does and have another meeting with the palliative care before making a final decision about rehab.  TOC informed.  UTI: Urinalysis with more than 80,000 Pseudomonas, received cefepime for 5 days,  ended on 01/22/2021.  Elevated LFTs with CT scan concerning for hepatic cirrhosis: Hepatitis panel negative.  Mild abnormal LFTs, cardiology discontinued statin and started  her on Zetia.  Patient also on amiodarone.  Per cardiology, this needs to be continued as she does not have any other options for her atrial fibrillation.  LFTs improving.  Prediabetes: Hemoglobin A1c 5.7.  Continue SSI.  Permanent A. fib on amiodarone and Eliquis: Continue amiodarone.  Therapeutic on Eliquis.  Repeat EKG consistent with chronic left bundle branch block pattern.   Appreciate their help.  Chronic combined heart failure: Without exacerbation.  Euvolemic other than some bilateral lower extremity edema but no pulmonary edema.  Received IV fluids.  Cardiology was consulted for this.  She received 1 dose of Lasix on 01/22/2021.  Renal function is stable.  No further diuretics planned by cardiology.  She is on Imdur and hydralazine.  Not a candidate for ACE/ARB/ARNI/MRA given CKD appreciate their help.  Essential hypertension: Blood pressure controlled.  Continue hydralazine and Imdur.  Hyperlipidemia: Crestor was switched to Zetia by cardiology on 01/23/2021 due to elevated LFTs and the need for continuation of amiodarone in order to protect liver  Debility: PT OT recommends SNF.  TOC on board for placement  GOC: Palliative has been on board and having communications with the family.  Per their meeting with family last Friday, family wanted to give her until Monday to see how she does before they make any decision towards pursuing SNF or comfort care.  Per recent update from palliative care yesterday, family is now talking among themselves and will make a final decision today.  Per recent update today as of 01/31/2021, patient is more alert and oriented and has requested palliative care to let her go/die.  Palliative care will talk to family today and encouraged to make a final decision as soon as possible.  TOC on the standby.  DVT prophylaxis:  apixaban (ELIQUIS) tablet 5 mg   Code Status: DNR Family Communication: None at bedside.   Palliative care leading communications with the  family.  They plan to touch base with the family tomorrow and come up with a final plan. Disposition Plan: Status is: Inpatient.   Dispo: The patient is from: SNF              Anticipated d/c is to: SNF              Patient currently is medically stable.   Difficult to place patient No    Consultants:  None  Procedures:  None  Antimicrobials:  Rocephin 8/24--- 8/25 Cefepime 8/26--01/22/2021 Rocephin 01/24/2021> Ampicillin 01/24/2021>   Subjective: Patient seen and examined.  Slightly lower than yesterday but too weak to have any conversation with me.  Looks comfortable.  Objective: Vitals:   01/30/21 2043 01/30/21 2136 01/31/21 0445 01/31/21 0943  BP:  (!) 122/30 (!) 117/33 (!) 125/40  Pulse: 76  73 72  Resp: 17  19 (!) 22  Temp: 99 F (37.2 C)  98.7 F (37.1 C) 98.8 F (37.1 C)  TempSrc: Oral  Oral Oral  SpO2: 97%  97% 98%  Weight:      Height:        Intake/Output Summary (Last 24 hours) at 01/31/2021 1306 Last data filed at 01/31/2021 1135 Gross per 24 hour  Intake 1593.37 ml  Output 1000 ml  Net 593.37 ml    Filed Weights   01/18/21 1747 01/23/21 1239 01/30/21 0420  Weight: 77.4 kg 77.2 kg  82 kg    Examination: General exam: Appears calm and comfortable  Respiratory system: Clear to auscultation. Respiratory effort normal. Cardiovascular system: S1 & S2 heard, RRR. No JVD, positive 2/6 murmur, positive click.  Trace pitting edema bilateral lower extremity. Gastrointestinal system: Abdomen is nondistended, soft and nontender. No organomegaly or masses felt. Normal bowel sounds heard. Central nervous system: Slightly lethargic but better than yesterday. Extremities: Symmetric 5 x 5 power.   Data Reviewed: I have personally reviewed following labs and imaging studies  CBC: Recent Labs  Lab 01/26/21 0307 01/27/21 0234 01/28/21 0119 01/29/21 0202 01/30/21 0059  WBC 5.3 6.5 6.0 7.0 6.5  NEUTROABS 4.2 5.2 4.7 6.7 5.2  HGB 7.5* 7.9* 7.5* 8.5* 7.4*   HCT 24.9* 25.5* 24.6* 28.5* 24.9*  MCV 102.0* 100.8* 99.6 102.5* 102.0*  PLT 88* 87* 91* 93* 102*    Basic Metabolic Panel: Recent Labs  Lab 01/25/21 0044 01/26/21 0307 01/29/21 0202  NA 149* 150* 146*  K 3.8 3.7 4.1  CL 122* 121* 119*  CO2 23 25 23   GLUCOSE 165* 191* 114*  BUN 50* 49* 42*  CREATININE 1.31* 1.14* 1.15*  CALCIUM 9.1 8.9 9.1  MG  --   --  2.2    GFR: Estimated Creatinine Clearance: 37.5 mL/min (A) (by C-G formula based on SCr of 1.15 mg/dL (H)). Liver Function Tests: Recent Labs  Lab 01/25/21 0044 01/26/21 0307  AST 60* 53*  ALT 79* 61*  ALKPHOS 136* 145*  BILITOT 0.4 <0.1*  PROT 4.9* 4.5*  ALBUMIN 1.5* 1.4*    No results for input(s): LIPASE, AMYLASE in the last 168 hours.  No results for input(s): AMMONIA in the last 168 hours.  Coagulation Profile: No results for input(s): INR, PROTIME in the last 168 hours.  Cardiac Enzymes: No results for input(s): CKTOTAL, CKMB, CKMBINDEX, TROPONINI in the last 168 hours. BNP (last 3 results) No results for input(s): PROBNP in the last 8760 hours. HbA1C: No results for input(s): HGBA1C in the last 72 hours.  CBG: Recent Labs  Lab 01/30/21 2121 01/31/21 0055 01/31/21 0440 01/31/21 0803 01/31/21 1201  GLUCAP 100* 125* 94 95 130*    Lipid Profile: No results for input(s): CHOL, HDL, LDLCALC, TRIG, CHOLHDL, LDLDIRECT in the last 72 hours. Thyroid Function Tests: No results for input(s): TSH, T4TOTAL, FREET4, T3FREE, THYROIDAB in the last 72 hours. Anemia Panel: No results for input(s): VITAMINB12, FOLATE, FERRITIN, TIBC, IRON, RETICCTPCT in the last 72 hours.  Sepsis Labs: No results for input(s): PROCALCITON, LATICACIDVEN in the last 168 hours.  Recent Results (from the past 240 hour(s))  Culture, blood (Routine X 2) w Reflex to ID Panel     Status: Abnormal   Collection Time: 01/23/21  6:09 PM   Specimen: BLOOD  Result Value Ref Range Status   Specimen Description BLOOD RIGHT  ANTECUBITAL  Final   Special Requests   Final    BOTTLES DRAWN AEROBIC AND ANAEROBIC Blood Culture adequate volume   Culture  Setup Time   Final    GRAM POSITIVE COCCI IN CHAINS IN BOTH AEROBIC AND ANAEROBIC BOTTLES Organism ID to follow CRITICAL RESULT CALLED TO, READ BACK BY AND VERIFIED WITH: Ronald Pippins [id number] MLM Performed at [facility], [place] [address], [place], [place] [address]    Culture ENTEROCOCCUS FAECALIS (A)  Final   Report Status 01/26/2021 FINAL  Final   Organism ID, Bacteria ENTEROCOCCUS FAECALIS  Final      Susceptibility   Enterococcus  faecalis - MIC*    AMPICILLIN <=2 SENSITIVE Sensitive     VANCOMYCIN 1 SENSITIVE Sensitive     GENTAMICIN SYNERGY SENSITIVE Sensitive     * ENTEROCOCCUS FAECALIS  Culture, blood (Routine X 2) w Reflex to ID Panel     Status: Abnormal   Collection Time: 01/23/21  6:09 PM   Specimen: BLOOD RIGHT HAND  Result Value Ref Range Status   Specimen Description BLOOD RIGHT HAND  Final   Special Requests   Final    BOTTLES DRAWN AEROBIC AND ANAEROBIC Blood Culture adequate volume   Culture  Setup Time   Final    GRAM POSITIVE COCCI IN CHAINS IN BOTH AEROBIC AND ANAEROBIC BOTTLES CRITICAL VALUE NOTED.  VALUE IS CONSISTENT WITH PREVIOUSLY REPORTED AND CALLED VALUE.    Culture (A)  Final    ENTEROCOCCUS FAECALIS SUSCEPTIBILITIES PERFORMED ON PREVIOUS CULTURE WITHIN THE LAST 5 DAYS. Performed at [facility], [place] [address] [place], [place] [address]    Report Status 01/26/2021 FINAL  Final  Blood Culture ID Panel (Reflexed)     Status: Abnormal   Collection Time: 01/23/21  6:09 PM  Result Value Ref Range Status   Enterococcus faecalis DETECTED (A) NOT DETECTED Final    Comment: CRITICAL RESULT CALLED TO, READ BACK BY AND VERIFIED WITH: Ronald Pippins [id number]    Enterococcus Faecium NOT DETECTED NOT DETECTED Final   Listeria monocytogenes NOT DETECTED NOT DETECTED Final   Staphylococcus species NOT DETECTED  NOT DETECTED Final   Staphylococcus aureus (BCID) NOT DETECTED NOT DETECTED Final   Staphylococcus epidermidis NOT DETECTED NOT DETECTED Final   Staphylococcus lugdunensis NOT DETECTED NOT DETECTED Final   Streptococcus species NOT DETECTED NOT DETECTED Final   Streptococcus agalactiae NOT DETECTED NOT DETECTED Final   Streptococcus pneumoniae NOT DETECTED NOT DETECTED Final   Streptococcus pyogenes NOT DETECTED NOT DETECTED Final   A.calcoaceticus-baumannii NOT DETECTED NOT DETECTED Final   Bacteroides fragilis NOT DETECTED NOT DETECTED Final   Enterobacterales NOT DETECTED NOT DETECTED Final   Enterobacter cloacae complex NOT DETECTED NOT DETECTED Final   Escherichia coli NOT DETECTED NOT DETECTED Final   Klebsiella aerogenes NOT DETECTED NOT DETECTED Final   Klebsiella oxytoca NOT DETECTED NOT DETECTED Final   Klebsiella pneumoniae NOT DETECTED NOT DETECTED Final   Proteus species NOT DETECTED NOT DETECTED Final   Salmonella species NOT DETECTED NOT DETECTED Final   Serratia marcescens NOT DETECTED NOT DETECTED Final   Haemophilus influenzae NOT DETECTED NOT DETECTED Final   Neisseria meningitidis NOT DETECTED NOT DETECTED Final   Pseudomonas aeruginosa NOT DETECTED NOT DETECTED Final   Stenotrophomonas maltophilia NOT DETECTED NOT DETECTED Final   Candida albicans NOT DETECTED NOT DETECTED Final   Candida auris NOT DETECTED NOT DETECTED Final   Candida glabrata NOT DETECTED NOT DETECTED Final   Candida krusei NOT DETECTED NOT DETECTED Final   Candida parapsilosis NOT DETECTED NOT DETECTED Final   Candida tropicalis NOT DETECTED NOT DETECTED Final   Cryptococcus neoformans/gattii NOT DETECTED NOT DETECTED Final   Vancomycin resistance NOT DETECTED NOT DETECTED Final    Comment: Performed at [facility], [address], [place], [place] [address]  Culture, blood (routine x 2)     Status: None   Collection Time: 01/25/21 11:21 AM   Specimen: BLOOD RIGHT HAND  Result Value  Ref Range Status   Specimen Description BLOOD RIGHT HAND  Final   Special Requests   Final    BOTTLES  DRAWN AEROBIC AND ANAEROBIC Blood Culture adequate volume   Culture   Final    NO GROWTH 5 DAYS Performed at [facility], [place] [address], [place], [place] [address]    Report Status 01/30/2021 FINAL  Final  Culture, blood (routine x 2)     Status: Abnormal   Collection Time: 01/25/21 11:41 AM   Specimen: BLOOD RIGHT HAND  Result Value Ref Range Status   Specimen Description BLOOD RIGHT HAND  Final   Special Requests   Final    BOTTLES DRAWN AEROBIC ONLY Blood Culture adequate volume   Culture  Setup Time   Final    GRAM POSITIVE COCCI AEROBIC BOTTLE ONLY CRITICAL VALUE NOTED.  VALUE IS CONSISTENT WITH PREVIOUSLY REPORTED AND CALLED VALUE.    Culture (A)  Final    STAPHYLOCOCCUS EPIDERMIDIS THE SIGNIFICANCE OF ISOLATING THIS ORGANISM FROM A SINGLE SET OF BLOOD CULTURES WHEN MULTIPLE SETS ARE DRAWN IS UNCERTAIN. PLEASE NOTIFY THE MICROBIOLOGY DEPARTMENT WITHIN ONE WEEK IF SPECIATION AND SENSITIVITIES ARE REQUIRED. Performed at [facility], [place] [address] [place], [place] [address]    Report Status 01/27/2021 FINAL  Final    Radiology Studies: No results found.  Scheduled Meds:  sodium chloride   Intravenous Once   amiodarone  200 mg Oral Daily   apixaban  5 mg Oral BID   Chlorhexidine Gluconate Cloth  6 each Topical Daily   ezetimibe  10 mg Oral Daily   feeding supplement  237 mL Oral TID BM   ferrous sulfate  325 mg Oral Daily   hydrALAZINE  10 mg Oral Q8H   insulin aspart  0-15 Units Subcutaneous TID WC   insulin aspart  0-5 Units Subcutaneous QHS   isosorbide mononitrate  15 mg Oral Daily   multivitamin with minerals  1 tablet Oral Daily   tamsulosin  0.4 mg Oral QPC breakfast   vitamin B-12  500 mcg Oral Daily   Continuous Infusions:  sodium chloride 10 mL/hr at 01/31/21 1135   ampicillin (OMNIPEN) IV 2 g (01/31/21 1208)   cefTRIAXone (ROCEPHIN)  IV  Stopped (01/31/21 1019)     LOS: 12 days   Time spent: 25 minutes  Darliss Cheney, MD [facility]

## 2021-01-31 NOTE — Progress Notes (Signed)
Nutrition Follow-up  DOCUMENTATION CODES:   Not applicable  INTERVENTION:   Continue PO supplements for now: Ensure Enlive po TID, each supplement provides 350 kcal and 20 grams of protein Magic cup TID with meals, each supplement provides 290 kcal and 9 grams of protein MVI with minerals daily  If plans to continue with supportive care will need to address prolonged inadequate nutrition intake and need for enteral nutrition support.   NUTRITION DIAGNOSIS:   Increased nutrient needs related to wound healing as evidenced by estimated needs.  GOAL:   Patient will meet greater than or equal to 90% of their needs  MONITOR:   PO intake, Supplement acceptance, Diet advancement, Labs, Skin  REASON FOR ASSESSMENT:   Consult Poor PO  ASSESSMENT:   85 yo female admitted with AKI on CKD stage 3a. PMH includes CHF, CAD, A fib, tachybrady syndrome, DM, CKD, anemia, HTN.  SLP advanced diet to dysphagia 2 with thin liquids on 9/1. Meal intakes 0-30% since 9/1; average 14%. Per RN, patient has been refusing to eat. She drank 2 ginger ale sodas and took her pills this morning, but is refusing to eat.  Palliative care team is following. Family has not made a decision regarding goals of care.   Labs reviewed.  CBG: [phone]  Medications reviewed and include ferrous sulfate, Novolog, MVI with minerals, Flomax, vitamin B-12 tablet, IV antibiotics.  Admission weight 77.4 kg Current weight 82 kg  Diet Order:   Diet Order             DIET DYS 2 Room service appropriate? Yes with Assist; Fluid consistency: Thin  Diet effective now                   EDUCATION NEEDS:   Not appropriate for education at this time  Skin:  Skin Integrity Issues:: Unstageable Unstageable: coccyx  Last BM:  9/5 type 2  Height:   Ht Readings from Last 1 Encounters:  01/23/21 5\' 2"  (1.575 m)    Weight:   Wt Readings from Last 1 Encounters:  01/30/21 82 kg    BMI:  Body mass index  is 33.07 kg/m.  Estimated Nutritional Needs:   Kcal:  1600-1800   Protein:  100-120 gm  Fluid:  >/= 1.6 L    Lucas Mallow, RD, LDN, CNSC Please refer to Amion for contact information.

## 2021-01-31 NOTE — Consult Note (Signed)
   [facility] CM Inpatient Consult   01/31/2021  Carrie Monroe May 14, 1936 [id number]  [place] [facility] [ACO] Patient: Carrie Monroe [facility]   Patient screened for hospitalization with noted extreme high risk score with less than 30 days unplanned readmission risk. Reviewed to assess for potential [place] Management service needs for post hospital transition.  Review of patient's medical record reveals patient is being followed by palliative care for consult.   Plan:  Continue to follow progress and disposition to assess for post hospital care management needs.    For questions contact:   Natividad Brood, RN BSN [facility] Liaison  [phone] business mobile phone Toll free office [phone]  Fax number: [phone] [email or web address]

## 2021-02-01 MED ORDER — BISACODYL 10 MG RE SUPP: 10.0000 mg | Freq: Once | RECTAL | Status: DC

## 2021-02-01 MED ORDER — MORPHINE SULFATE (PF) 2 MG/ML IV SOLN
1.0000 mg | INTRAVENOUS | Status: DC
Start: 2021-02-01 — End: 2021-02-02
  Administered 2021-02-01 (×3): 1 mg via INTRAVENOUS
  Filled 2021-02-01 (×3): qty 1

## 2021-02-01 MED ORDER — POLYETHYLENE GLYCOL 3350 17 G PO PACK
17.0000 g | PACK | Freq: Every day | ORAL | Status: DC
  Administered 2021-02-01 – 2021-02-05 (×3): 17 g via ORAL
  Filled 2021-02-01 (×5): qty 1

## 2021-02-01 MED ORDER — LORAZEPAM 2 MG/ML PO CONC: 1.0000 mg | ORAL | Status: DC | PRN

## 2021-02-01 NOTE — TOC Progression Note (Addendum)
Transition of Care [facility]) - Progression Note    Patient Details  Name: Carrie Monroe MRN: [id number] Date of Birth: 07-Aug-1935  [facility] of [facility]) CM/SW [place], [place] Phone Number: 02/01/2021, 1:49 PM  Clinical Narrative:     Update- CSW received consult for residential hospice placement for patient. CSW called patients daughter [name]. CSW left voicemail and awaiting callback.   Palliative following. CSW following for DC plan. CSW will continue to follow and assist with dc planning needs.  Expected Discharge Plan: [place] Barriers to Discharge: Continued Medical Work up  Expected Discharge Plan and Services Expected Discharge Plan: [place] In-house Referral: Clinical Social Work     Living arrangements for the past 2 months: [place]                                       Social Determinants of Health (SDOH) Interventions    Readmission Risk Interventions Readmission Risk Prevention Plan 03/17/2020  Transportation Screening Complete  PCP or Specialist Appt within 3-5 Days Complete  HRI or [place] Complete  Social Work Consult for [place] Planning/Counseling Complete  Palliative Care Screening Not Applicable  Medication Review Press photographer) Complete  Some recent data might be hidden

## 2021-02-01 NOTE — Progress Notes (Signed)
PROGRESS NOTE    Carrie Monroe  LSL:[id number] DOB: 06/21/1935 DOA: 01/18/2021 PCP: Ginger Organ., MD    Brief Narrative:  85 year old female with history of congestive heart failure, coronary artery disease status post stenting, atrial fibrillation tachybradycardia syndrome, diabetes, chronic kidney disease and anemia recently admitted to the hospital with weakness and pseudogout of the knee was discharged to a skilled nursing rehab 9 days ago brought back to the emergency room with elevated creatinine on routine exam at the skilled nursing facility.  Patient is poor historian.  Patient's daughter tells me that she had been looking weak and tired however she has not noticed anything new.  In the emergency room CT scan abdomen pelvis showed prominent distention of the bladder with bilateral hydronephrosis and hydroureter, changes of hepatic cirrhosis with prominent splenic vein varices and splenomegaly.  Creatinine 2.3 and BUN 137.  A Foley catheter was placed.  Admitted for further work-up.  Recent hospitalization 01/02/2021--01/09/2021 with severe fatigue generalized weakness-found to have acute superimposed on chronic diastolic heart failure as well as decompensated pulmonary hypertension--at that admission palliative care consulted  Assessment & Plan:   Principal Problem:   ARF (acute renal failure) ([place]) Active Problems:   Essential hypertension   CAD- severe 3V CAD    Three-vessel CAD-PCI to LAD and LCx, med management of RCA   S/P TAVR (transcatheter aortic valve replacement)   Aortic stenosis, severe   Goals of care, counseling/discussion   Persistent atrial fibrillation (HCC)   AKI (acute kidney injury) ([place])   Acute encephalopathy   Subacute bacterial endocarditis   Cirrhosis of liver without ascites ([place])   Obstructive uropathy  Acute kidney injury on chronic kidney disease stage 3a: Baseline creatinine about 1.5.  Presented with creatinine of 2.35.  CT abdomen at  admission showed prominent urinary bladder with bilateral hydronephrosis and hydroureter, probably postobstructive and dehydration.  Renal function now at baseline now.  Continue and Discharge with Foley catheter until patient has good clinical improvement of her mobility.  Continue Flomax.  Acute metabolic encephalopathy in a patient with multiple medical problems: CT head unremarkable.  Likely metabolic from uremia and UTI.  Urea 135 on presentation.  Trending down.  Ammonia is normal. B 12 and folic acid is normal.  Patient fully alert and oriented since yesterday.  She is very clear in her plans and keeps saying that " I am ready to go, put me in hospice" son at the bedside.  Enterococcus bacteremia/bacterial endocarditis: Transthoracic echo shows large mitral valve vegetation/endocarditis.  Blood cultures are now growing Enterococcus faecalis.  ID consulted patient has been started on Rocephin and ampicillin.  We have consulted ID.  Patient seen by CT surgery.  Due to multiple comorbidities, she is deemed to be a poor surgical candidate so CT surgery did not recommend any surgery.  ID recommended 6 weeks of IV antibiotics.  Palliative care on board leading the communication with the family.  Patient herself decided to stop everything, transition to comfort care on 01/31/2021 and antibiotics were discontinued.  Awaiting further plans from palliative care to see if she would go home with hospice versus hospice facility.  UTI: Urinalysis with more than 80,000 Pseudomonas, received cefepime for 5 days, ended on 01/22/2021.  Elevated LFTs with CT scan concerning for hepatic cirrhosis: Hepatitis panel negative.  Mild abnormal LFTs, cardiology discontinued statin and started her on Zetia.  Patient also on amiodarone.  Per cardiology, this needs to be continued as she does not have  any other options for her atrial fibrillation.  LFTs improving.  Prediabetes: Hemoglobin A1c 5.7.  Continue SSI.  Permanent A. fib  on amiodarone and Eliquis: Continue amiodarone.  Therapeutic on Eliquis.  Repeat EKG consistent with chronic left bundle branch block pattern.   Appreciate their help.  Chronic combined heart failure: Without exacerbation.  Euvolemic other than some bilateral lower extremity edema but no pulmonary edema.  Received IV fluids.  Cardiology was consulted for this.  She received 1 dose of Lasix on 01/22/2021.  Renal function is stable.  No further diuretics planned by cardiology.  She is on Imdur and hydralazine.  Not a candidate for ACE/ARB/ARNI/MRA given CKD appreciate their help.  Essential hypertension: Blood pressure controlled.  Continue hydralazine and Imdur.  Hyperlipidemia: Crestor was switched to Zetia by cardiology on 01/23/2021 due to elevated LFTs and the need for continuation of amiodarone in order to protect liver  Debility: PT OT recommends SNF.  TOC on board for placement  GOC: Palliative care leading their communications with the family.  Finally patient herself asked to put her to hospice stating " I am ready to go to heaven" and she was subsequently transitioned to comfort care on 01/31/2021.  When discussed with the son about what they have decided about disposition, he stated that they would prefer for her to stay in the hospital until she passes but patient right away interrupted him saying "no, took me to hospice, you are talking about me " awaiting further recommendations from palliative care.  DVT prophylaxis:    Code Status: DNR Family Communication: Son at the bedside.  Palliative care leading communications with the family.   Disposition Plan: Status is: Inpatient.   Dispo: The patient is from: SNF              Anticipated d/c is to: SNF              Patient currently is medically stable.   Difficult to place patient No    Consultants:  None  Procedures:  None  Antimicrobials:  Rocephin 8/24--- 8/25 Cefepime 8/26--01/22/2021 Rocephin 01/24/2021> Ampicillin  01/24/2021>   Subjective: Seen and examined.  Son at the bedside.  Patient very comfortable, alert and oriented.  She seems to be very comfortable with her decision of comfort care and wants to go to hospice facility.  Continue comfort care per palliative care.  Objective: Vitals:   01/31/21 1431 01/31/21 1946 02/01/21 0610 02/01/21 0841  BP: (!) 130/38 (!) 124/40 (!) 130/49   Pulse: 76  69   Resp: 18  17   Temp: 98.3 F (36.8 C) 98.1 F (36.7 C) 98 F (36.7 C) 97.7 F (36.5 C)  TempSrc: Oral Oral Oral Oral  SpO2: 97%  94%   Weight:      Height:        Intake/Output Summary (Last 24 hours) at 02/01/2021 1105 Last data filed at 02/01/2021 0900 Gross per 24 hour  Intake 1127.18 ml  Output 800 ml  Net 327.18 ml    Filed Weights   01/18/21 1747 01/23/21 1239 01/30/21 0420  Weight: 77.4 kg 77.2 kg 82 kg    Examination: General exam: Appears calm and comfortable  Respiratory system: Clear to auscultation. Respiratory effort normal. Cardiovascular system: S1 & S2 heard, RRR. No JVD, positive murmur and click no pedal edema. Gastrointestinal system: Abdomen is nondistended, soft and nontender. No organomegaly or masses felt. Normal bowel sounds heard. Central nervous system: Alert and oriented. No focal neurological  deficits. Extremities: Symmetric 5 x 5 power. Skin: No rashes, lesions or ulcers.  Psychiatry: Judgement and insight appear normal.   Data Reviewed: I have personally reviewed following labs and imaging studies  CBC: Recent Labs  Lab 01/26/21 0307 01/27/21 0234 01/28/21 0119 01/29/21 0202 01/30/21 0059  WBC 5.3 6.5 6.0 7.0 6.5  NEUTROABS 4.2 5.2 4.7 6.7 5.2  HGB 7.5* 7.9* 7.5* 8.5* 7.4*  HCT 24.9* 25.5* 24.6* 28.5* 24.9*  MCV 102.0* 100.8* 99.6 102.5* 102.0*  PLT 88* 87* 91* 93* 102*    Basic Metabolic Panel: Recent Labs  Lab 01/26/21 0307 01/29/21 0202  NA 150* 146*  K 3.7 4.1  CL 121* 119*  CO2 25 23  GLUCOSE 191* 114*  BUN 49* 42*   CREATININE 1.14* 1.15*  CALCIUM 8.9 9.1  MG  --  2.2    GFR: Estimated Creatinine Clearance: 37.5 mL/min (A) (by C-G formula based on SCr of 1.15 mg/dL (H)). Liver Function Tests: Recent Labs  Lab 01/26/21 0307  AST 53*  ALT 61*  ALKPHOS 145*  BILITOT <0.1*  PROT 4.5*  ALBUMIN 1.4*    No results for input(s): LIPASE, AMYLASE in the last 168 hours.  No results for input(s): AMMONIA in the last 168 hours.  Coagulation Profile: No results for input(s): INR, PROTIME in the last 168 hours.  Cardiac Enzymes: No results for input(s): CKTOTAL, CKMB, CKMBINDEX, TROPONINI in the last 168 hours. BNP (last 3 results) No results for input(s): PROBNP in the last 8760 hours. HbA1C: No results for input(s): HGBA1C in the last 72 hours.  CBG: Recent Labs  Lab 01/31/21 0055 01/31/21 0440 01/31/21 0803 01/31/21 1201 01/31/21 1659  GLUCAP 125* 94 95 130* 176*    Lipid Profile: No results for input(s): CHOL, HDL, LDLCALC, TRIG, CHOLHDL, LDLDIRECT in the last 72 hours. Thyroid Function Tests: No results for input(s): TSH, T4TOTAL, FREET4, T3FREE, THYROIDAB in the last 72 hours. Anemia Panel: No results for input(s): VITAMINB12, FOLATE, FERRITIN, TIBC, IRON, RETICCTPCT in the last 72 hours.  Sepsis Labs: No results for input(s): PROCALCITON, LATICACIDVEN in the last 168 hours.  Recent Results (from the past 240 hour(s))  Culture, blood (Routine X 2) w Reflex to ID Panel     Status: Abnormal   Collection Time: 01/23/21  6:09 PM   Specimen: BLOOD  Result Value Ref Range Status   Specimen Description BLOOD RIGHT ANTECUBITAL  Final   Special Requests   Final    BOTTLES DRAWN AEROBIC AND ANAEROBIC Blood Culture adequate volume   Culture  Setup Time   Final    GRAM POSITIVE COCCI IN CHAINS IN BOTH AEROBIC AND ANAEROBIC BOTTLES Organism ID to follow CRITICAL RESULT CALLED TO, READ BACK BY AND VERIFIED WITH: Ronald Pippins [id number] MLM Performed at [facility],  [place] [address], [place], [place] [address]    Culture ENTEROCOCCUS FAECALIS (A)  Final   Report Status 01/26/2021 FINAL  Final   Organism ID, Bacteria ENTEROCOCCUS FAECALIS  Final      Susceptibility   Enterococcus faecalis - MIC*    AMPICILLIN <=2 SENSITIVE Sensitive     VANCOMYCIN 1 SENSITIVE Sensitive     GENTAMICIN SYNERGY SENSITIVE Sensitive     * ENTEROCOCCUS FAECALIS  Culture, blood (Routine X 2) w Reflex to ID Panel     Status: Abnormal   Collection Time: 01/23/21  6:09 PM   Specimen: BLOOD RIGHT HAND  Result Value Ref Range Status   Specimen Description BLOOD  RIGHT HAND  Final   Special Requests   Final    BOTTLES DRAWN AEROBIC AND ANAEROBIC Blood Culture adequate volume   Culture  Setup Time   Final    GRAM POSITIVE COCCI IN CHAINS IN BOTH AEROBIC AND ANAEROBIC BOTTLES CRITICAL VALUE NOTED.  VALUE IS CONSISTENT WITH PREVIOUSLY REPORTED AND CALLED VALUE.    Culture (A)  Final    ENTEROCOCCUS FAECALIS SUSCEPTIBILITIES PERFORMED ON PREVIOUS CULTURE WITHIN THE LAST 5 DAYS. Performed at [facility], [place] [address], [place], [place] [address]    Report Status 01/26/2021 FINAL  Final  Blood Culture ID Panel (Reflexed)     Status: Abnormal   Collection Time: 01/23/21  6:09 PM  Result Value Ref Range Status   Enterococcus faecalis DETECTED (A) NOT DETECTED Final    Comment: CRITICAL RESULT CALLED TO, READ BACK BY AND VERIFIED WITH: Ronald Pippins [id number] MLM    Enterococcus Faecium NOT DETECTED NOT DETECTED Final   Listeria monocytogenes NOT DETECTED NOT DETECTED Final   Staphylococcus species NOT DETECTED NOT DETECTED Final   Staphylococcus aureus (BCID) NOT DETECTED NOT DETECTED Final   Staphylococcus epidermidis NOT DETECTED NOT DETECTED Final   Staphylococcus lugdunensis NOT DETECTED NOT DETECTED Final   Streptococcus species NOT DETECTED NOT DETECTED Final   Streptococcus agalactiae NOT DETECTED NOT DETECTED Final   Streptococcus pneumoniae NOT DETECTED NOT  DETECTED Final   Streptococcus pyogenes NOT DETECTED NOT DETECTED Final   A.calcoaceticus-baumannii NOT DETECTED NOT DETECTED Final   Bacteroides fragilis NOT DETECTED NOT DETECTED Final   Enterobacterales NOT DETECTED NOT DETECTED Final   Enterobacter cloacae complex NOT DETECTED NOT DETECTED Final   Escherichia coli NOT DETECTED NOT DETECTED Final   Klebsiella aerogenes NOT DETECTED NOT DETECTED Final   Klebsiella oxytoca NOT DETECTED NOT DETECTED Final   Klebsiella pneumoniae NOT DETECTED NOT DETECTED Final   Proteus species NOT DETECTED NOT DETECTED Final   Salmonella species NOT DETECTED NOT DETECTED Final   Serratia marcescens NOT DETECTED NOT DETECTED Final   Haemophilus influenzae NOT DETECTED NOT DETECTED Final   Neisseria meningitidis NOT DETECTED NOT DETECTED Final   Pseudomonas aeruginosa NOT DETECTED NOT DETECTED Final   Stenotrophomonas maltophilia NOT DETECTED NOT DETECTED Final   Candida albicans NOT DETECTED NOT DETECTED Final   Candida auris NOT DETECTED NOT DETECTED Final   Candida glabrata NOT DETECTED NOT DETECTED Final   Candida krusei NOT DETECTED NOT DETECTED Final   Candida parapsilosis NOT DETECTED NOT DETECTED Final   Candida tropicalis NOT DETECTED NOT DETECTED Final   Cryptococcus neoformans/gattii NOT DETECTED NOT DETECTED Final   Vancomycin resistance NOT DETECTED NOT DETECTED Final    Comment: Performed at [facility], [address], [place], [place] [address]  Culture, blood (routine x 2)     Status: None   Collection Time: 01/25/21 11:21 AM   Specimen: BLOOD RIGHT HAND  Result Value Ref Range Status   Specimen Description BLOOD RIGHT HAND  Final   Special Requests   Final    BOTTLES DRAWN AEROBIC AND ANAEROBIC Blood Culture adequate volume   Culture   Final    NO GROWTH 5 DAYS Performed at [facility], [place] [address] [place],  [address]    Report Status 01/30/2021 FINAL  Final  Culture, blood (routine x 2)     Status:  Abnormal   Collection Time: 01/25/21 11:41 AM   Specimen: BLOOD RIGHT HAND  Result Value Ref Range Status  Specimen Description BLOOD RIGHT HAND  Final   Special Requests   Final    BOTTLES DRAWN AEROBIC ONLY Blood Culture adequate volume   Culture  Setup Time   Final    GRAM POSITIVE COCCI AEROBIC BOTTLE ONLY CRITICAL VALUE NOTED.  VALUE IS CONSISTENT WITH PREVIOUSLY REPORTED AND CALLED VALUE.    Culture (A)  Final    STAPHYLOCOCCUS EPIDERMIDIS THE SIGNIFICANCE OF ISOLATING THIS ORGANISM FROM A SINGLE SET OF BLOOD CULTURES WHEN MULTIPLE SETS ARE DRAWN IS UNCERTAIN. PLEASE NOTIFY THE MICROBIOLOGY DEPARTMENT WITHIN ONE WEEK IF SPECIATION AND SENSITIVITIES ARE REQUIRED. Performed at [facility], [place] [address], [place], [place] [address]    Report Status 01/27/2021 FINAL  Final    Radiology Studies: No results found.  Scheduled Meds:  sodium chloride   Intravenous Once   bisacodyl  10 mg Rectal Once   polyethylene glycol  17 g Oral Daily   tamsulosin  0.4 mg Oral QPC breakfast   Continuous Infusions:  sodium chloride 10 mL/hr at 01/31/21 1838     LOS: 13 days   Time spent: 28 minutes  Darliss Cheney, MD [facility]

## 2021-02-01 NOTE — Progress Notes (Addendum)
Palliative Medicine RN Note: Chart check. Our NP who has been seeing Mrs Carrie Monroe, [place], is out today and tomorrow.  Reviewed Dr Jacob Moores note from today where he states that pt wants to go to hospice. Per YYFRT'M note yesterday, pt is absolutely appropriate for residential hospice.   Based on this, I have placed a TOC order for residential hospice referral. Pt has required several doses of morphine this morning, and her family needs the level of support that would best be met at a residential facility. Antibiotics were stopped yesterday. She is having some liquid PO intake with encouragement, but otherwise is eating 10% of meals. Prognosis is likely days to a week or two.  Marjie Skiff Mardell Suttles, RN, BSN, [facility] Palliative Medicine Team 02/01/2021 2:33 PM Office [phone]     ADDENDUM: Called daughter/POA [name]; no answer. Left message that pt is doing better with comfort meds and that she is begging for hospice, so family can expect a call from [facility].  Marjie Skiff Stefanee Mckell, RN, BSN, [facility] Palliative Medicine Team 02/01/2021 3:08 PM Office [phone]

## 2021-02-02 MED ORDER — FENTANYL CITRATE PF 50 MCG/ML IJ SOSY
25.0000 ug | PREFILLED_SYRINGE | INTRAMUSCULAR | Status: DC | PRN
  Administered 2021-02-04: 25 ug via INTRAVENOUS
  Filled 2021-02-02: qty 1

## 2021-02-02 NOTE — TOC Progression Note (Signed)
Transition of Care [facility]) - Progression Note    Patient Details  Name: Carrie Monroe MRN: [id number] Date of Birth: Jun 20, 1935  Transition [facility]) CM/SW [place], [place] Phone Number: 02/02/2021, 10:10 AM  Clinical Narrative:     CSW received call from [place] patients daughter. [name] confirmed she is going to discuss with family residential hospice to help determine which facility patient will go Patients daughter confirmed she will give CSW callback with choice.CSW awaiting callback.CSW will continue to follow and assist with dc planning needs.  Expected Discharge Plan: [place] Barriers to Discharge: Continued Medical Work up  Expected Discharge Plan and Services Expected Discharge Plan: [place] In-house Referral: Clinical Social Work     Living arrangements for the past 2 months: [place]                                       Social Determinants of Health (SDOH) Interventions    Readmission Risk Interventions Readmission Risk Prevention Plan 03/17/2020  Transportation Screening Complete  PCP or Specialist Appt within 3-5 Days Complete  HRI or [place] Complete  Social Work Consult for [place] Planning/Counseling Complete  Palliative Care Screening Not Applicable  Medication Review [employer]) Complete  Some recent data might be hidden

## 2021-02-02 NOTE — Care Management Important Message (Signed)
Important Message  Patient Details  Name: Carrie Monroe MRN: [id number] Date of Birth: [birth year]   Medicare Important Message Given:  Yes     Shelda Altes 02/02/2021, 10:11 AM

## 2021-02-02 NOTE — Progress Notes (Signed)
PROGRESS NOTE    Carrie FINKBINER  FKC:[id number] DOB: 03-Mar-1936 DOA: 01/18/2021 PCP: Ginger Organ., MD    Brief Narrative:  85 year old female with history of congestive heart failure, coronary artery disease status post stenting, atrial fibrillation tachybradycardia syndrome, diabetes, chronic kidney disease and anemia recently admitted to the hospital with weakness and pseudogout of the knee was discharged to a skilled nursing rehab 9 days ago brought back to the emergency room with elevated creatinine on routine exam at the skilled nursing facility.  Patient is poor historian.  Patient's daughter tells me that she had been looking weak and tired however she has not noticed anything new.  In the emergency room CT scan abdomen pelvis showed prominent distention of the bladder with bilateral hydronephrosis and hydroureter, changes of hepatic cirrhosis with prominent splenic vein varices and splenomegaly.  Creatinine 2.3 and BUN 137.  A Foley catheter was placed.  Admitted for further work-up.  Recent hospitalization 01/02/2021--01/09/2021 with severe fatigue generalized weakness-found to have acute superimposed on chronic diastolic heart failure as well as decompensated pulmonary hypertension--at that admission palliative care consulted  Assessment & Plan:   Principal Problem:   ARF (acute renal failure) ([place]) Active Problems:   Essential hypertension   CAD- severe 3V CAD    Three-vessel CAD-PCI to LAD and LCx, med management of RCA   S/P TAVR (transcatheter aortic valve replacement)   Aortic stenosis, severe   Goals of care, counseling/discussion   Persistent atrial fibrillation (HCC)   AKI (acute kidney injury) ([place])   Acute encephalopathy   Subacute bacterial endocarditis   Cirrhosis of liver without ascites ([place])   Obstructive uropathy  Acute kidney injury on chronic kidney disease stage 3a: Baseline creatinine about 1.5.  Presented with creatinine of 2.35.  CT abdomen at  admission showed prominent urinary bladder with bilateral hydronephrosis and hydroureter, probably postobstructive and dehydration.  Renal function now at baseline now.  Continue and Discharge with Foley catheter until patient has good clinical improvement of her mobility.  Continue Flomax.  Acute metabolic encephalopathy in a patient with multiple medical problems: CT head unremarkable.  Likely metabolic from uremia and UTI.  Urea 135 on presentation.  Trending down.  Ammonia is normal. B 12 and folic acid is normal.  Patient fully alert and oriented since day before yesterday.  She is very clear in her plans and keeps saying that " I am ready to go, put me in hospice" son at the bedside.  Enterococcus bacteremia/bacterial endocarditis: Transthoracic echo shows large mitral valve vegetation/endocarditis.  Blood cultures are now growing Enterococcus faecalis.  ID consulted patient has been started on Rocephin and ampicillin.  We have consulted ID.  Patient seen by CT surgery.  Due to multiple comorbidities, she is deemed to be a poor surgical candidate so CT surgery did not recommend any surgery.  ID recommended 6 weeks of IV antibiotics.  Palliative care on board leading the communication with the family.  Patient herself decided to stop everything, transition to comfort care on 01/31/2021 and antibiotics were discontinued.  Awaiting further plans from palliative care to see if she would go home with hospice versus hospice facility.  UTI: Urinalysis with more than 80,000 Pseudomonas, received cefepime for 5 days, ended on 01/22/2021.  Elevated LFTs with CT scan concerning for hepatic cirrhosis: Hepatitis panel negative.  Mild abnormal LFTs, cardiology discontinued statin and started her on Zetia.  Patient also on amiodarone.  Per cardiology, this needs to be continued as she does  not have any other options for her atrial fibrillation.  LFTs improving.  Prediabetes: Hemoglobin A1c 5.7.  Continue  SSI.  Permanent A. fib on amiodarone and Eliquis: Continue amiodarone.  Therapeutic on Eliquis.  Repeat EKG consistent with chronic left bundle branch block pattern.   Appreciate their help.  Chronic combined heart failure: Without exacerbation.  Euvolemic other than some bilateral lower extremity edema but no pulmonary edema.  Received IV fluids.  Cardiology was consulted for this.  She received 1 dose of Lasix on 01/22/2021.  Renal function is stable.  No further diuretics planned by cardiology.  She is on Imdur and hydralazine.  Not a candidate for ACE/ARB/ARNI/MRA given CKD appreciate their help.  Essential hypertension: Blood pressure controlled.  Continue hydralazine and Imdur.  Hyperlipidemia: Crestor was switched to Zetia by cardiology on 01/23/2021 due to elevated LFTs and the need for continuation of amiodarone in order to protect liver  Debility: PT OT recommends SNF.  TOC on board for placement  GOC: Palliative care leading their communications with the family.  Finally patient herself asked to put her to hospice stating " I am ready to go to heaven" and she was subsequently transitioned to comfort care on 01/31/2021.  Per palliative care update today, they have talked to her daughter and she is going to talk with her siblings and will reveal their final plan about where they would like disposition for this patient.  TOC is consulted for residential hospice.  DVT prophylaxis:    Code Status: DNR Family Communication: Ladies present at the bedside who calls her self " adopted daughter"  Disposition Plan: Status is: Inpatient.   Dispo: The patient is from: SNF              Anticipated d/c is to: SNF              Patient currently is medically stable.   Difficult to place patient No    Consultants:  None  Procedures:  None  Antimicrobials:  Rocephin 8/24--- 8/25 Cefepime 8/26--01/22/2021 Rocephin 01/24/2021> Ampicillin 01/24/2021>   Subjective: Patient seen and examined.   Her "adopted daughter" is present at bedside.  Patient is fully alert and oriented.  Patient mentioned that she is thinking about going home with hospice.  She was told by the daughter that she probably will not go there since they do not have the equipment.  Objective: Vitals:   02/01/21 0841 02/01/21 1316 02/02/21 0800 02/02/21 1057  BP:  (!) 112/40  (!) 120/36  Pulse:  72 80 75  Resp:  (!) 26 (!) 24 19  Temp: 97.7 F (36.5 C) 98 F (36.7 C)  97.7 F (36.5 C)  TempSrc: Oral Oral  Axillary  SpO2:  98% 96% 99%  Weight:      Height:        Intake/Output Summary (Last 24 hours) at 02/02/2021 1249 Last data filed at 02/02/2021 0900 Gross per 24 hour  Intake 340 ml  Output 400 ml  Net -60 ml    Filed Weights   01/18/21 1747 01/23/21 1239 01/30/21 0420  Weight: 77.4 kg 77.2 kg 82 kg    Examination: General exam: Appears calm and comfortable  Respiratory system: Clear to auscultation. Respiratory effort normal. Cardiovascular system: S1 & S2 heard, RRR. No JVD, murmurs, rubs, gallops or clicks. No pedal edema. Gastrointestinal system: Abdomen is nondistended, soft and nontender. No organomegaly or masses felt. Normal bowel sounds heard. Central nervous system: Alert and oriented. No focal neurological  deficits. Extremities: Symmetric 5 x 5 power. Skin: No rashes, lesions or ulcers.  Psychiatry: Judgement and insight appear normal. Mood & affect appropriate.    Data Reviewed: I have personally reviewed following labs and imaging studies  CBC: Recent Labs  Lab 01/27/21 0234 01/28/21 0119 01/29/21 0202 01/30/21 0059  WBC 6.5 6.0 7.0 6.5  NEUTROABS 5.2 4.7 6.7 5.2  HGB 7.9* 7.5* 8.5* 7.4*  HCT 25.5* 24.6* 28.5* 24.9*  MCV 100.8* 99.6 102.5* 102.0*  PLT 87* 91* 93* 102*    Basic Metabolic Panel: Recent Labs  Lab 01/29/21 0202  NA 146*  K 4.1  CL 119*  CO2 23  GLUCOSE 114*  BUN 42*  CREATININE 1.15*  CALCIUM 9.1  MG 2.2    GFR: Estimated Creatinine  Clearance: 37.5 mL/min (A) (by C-G formula based on SCr of 1.15 mg/dL (H)). Liver Function Tests: No results for input(s): AST, ALT, ALKPHOS, BILITOT, PROT, ALBUMIN in the last 168 hours.  No results for input(s): LIPASE, AMYLASE in the last 168 hours.  No results for input(s): AMMONIA in the last 168 hours.  Coagulation Profile: No results for input(s): INR, PROTIME in the last 168 hours.  Cardiac Enzymes: No results for input(s): CKTOTAL, CKMB, CKMBINDEX, TROPONINI in the last 168 hours. BNP (last 3 results) No results for input(s): PROBNP in the last 8760 hours. HbA1C: No results for input(s): HGBA1C in the last 72 hours.  CBG: Recent Labs  Lab 01/31/21 0055 01/31/21 0440 01/31/21 0803 01/31/21 1201 01/31/21 1659  GLUCAP 125* 94 95 130* 176*    Lipid Profile: No results for input(s): CHOL, HDL, LDLCALC, TRIG, CHOLHDL, LDLDIRECT in the last 72 hours. Thyroid Function Tests: No results for input(s): TSH, T4TOTAL, FREET4, T3FREE, THYROIDAB in the last 72 hours. Anemia Panel: No results for input(s): VITAMINB12, FOLATE, FERRITIN, TIBC, IRON, RETICCTPCT in the last 72 hours.  Sepsis Labs: No results for input(s): PROCALCITON, LATICACIDVEN in the last 168 hours.  Recent Results (from the past 240 hour(s))  Culture, blood (Routine X 2) w Reflex to ID Panel     Status: Abnormal   Collection Time: 01/23/21  6:09 PM   Specimen: BLOOD  Result Value Ref Range Status   Specimen Description BLOOD RIGHT ANTECUBITAL  Final   Special Requests   Final    BOTTLES DRAWN AEROBIC AND ANAEROBIC Blood Culture adequate volume   Culture  Setup Time   Final    GRAM POSITIVE COCCI IN CHAINS IN BOTH AEROBIC AND ANAEROBIC BOTTLES Organism ID to follow CRITICAL RESULT CALLED TO, READ BACK BY AND VERIFIED WITH: Ronald Pippins [id number] Performed at [facility], [place] [address], [place], [place] [address]    Culture ENTEROCOCCUS FAECALIS (A)  Final   Report Status 01/26/2021  FINAL  Final   Organism ID, Bacteria ENTEROCOCCUS FAECALIS  Final      Susceptibility   Enterococcus faecalis - MIC*    AMPICILLIN <=2 SENSITIVE Sensitive     VANCOMYCIN 1 SENSITIVE Sensitive     GENTAMICIN SYNERGY SENSITIVE Sensitive     * ENTEROCOCCUS FAECALIS  Culture, blood (Routine X 2) w Reflex to ID Panel     Status: Abnormal   Collection Time: 01/23/21  6:09 PM   Specimen: BLOOD RIGHT HAND  Result Value Ref Range Status   Specimen Description BLOOD RIGHT HAND  Final   Special Requests   Final    BOTTLES DRAWN AEROBIC AND ANAEROBIC Blood Culture adequate volume   Culture  Setup Time   Final    GRAM POSITIVE COCCI IN CHAINS IN BOTH AEROBIC AND ANAEROBIC BOTTLES CRITICAL VALUE NOTED.  VALUE IS CONSISTENT WITH PREVIOUSLY REPORTED AND CALLED VALUE.    Culture (A)  Final    ENTEROCOCCUS FAECALIS SUSCEPTIBILITIES PERFORMED ON PREVIOUS CULTURE WITHIN THE LAST 5 DAYS. Performed at [facility], [place] [address], [place], [place] [address]    Report Status 01/26/2021 FINAL  Final  Blood Culture ID Panel (Reflexed)     Status: Abnormal   Collection Time: 01/23/21  6:09 PM  Result Value Ref Range Status   Enterococcus faecalis DETECTED (A) NOT DETECTED Final    Comment: CRITICAL RESULT CALLED TO, READ BACK BY AND VERIFIED WITH: Ronald Pippins [id number]    Enterococcus Faecium NOT DETECTED NOT DETECTED Final   Listeria monocytogenes NOT DETECTED NOT DETECTED Final   Staphylococcus species NOT DETECTED NOT DETECTED Final   Staphylococcus aureus (BCID) NOT DETECTED NOT DETECTED Final   Staphylococcus epidermidis NOT DETECTED NOT DETECTED Final   Staphylococcus lugdunensis NOT DETECTED NOT DETECTED Final   Streptococcus species NOT DETECTED NOT DETECTED Final   Streptococcus agalactiae NOT DETECTED NOT DETECTED Final   Streptococcus pneumoniae NOT DETECTED NOT DETECTED Final   Streptococcus pyogenes NOT DETECTED NOT DETECTED Final   A.calcoaceticus-baumannii NOT DETECTED NOT  DETECTED Final   Bacteroides fragilis NOT DETECTED NOT DETECTED Final   Enterobacterales NOT DETECTED NOT DETECTED Final   Enterobacter cloacae complex NOT DETECTED NOT DETECTED Final   Escherichia coli NOT DETECTED NOT DETECTED Final   Klebsiella aerogenes NOT DETECTED NOT DETECTED Final   Klebsiella oxytoca NOT DETECTED NOT DETECTED Final   Klebsiella pneumoniae NOT DETECTED NOT DETECTED Final   Proteus species NOT DETECTED NOT DETECTED Final   Salmonella species NOT DETECTED NOT DETECTED Final   Serratia marcescens NOT DETECTED NOT DETECTED Final   Haemophilus influenzae NOT DETECTED NOT DETECTED Final   Neisseria meningitidis NOT DETECTED NOT DETECTED Final   Pseudomonas aeruginosa NOT DETECTED NOT DETECTED Final   Stenotrophomonas maltophilia NOT DETECTED NOT DETECTED Final   Candida albicans NOT DETECTED NOT DETECTED Final   Candida auris NOT DETECTED NOT DETECTED Final   Candida glabrata NOT DETECTED NOT DETECTED Final   Candida krusei NOT DETECTED NOT DETECTED Final   Candida parapsilosis NOT DETECTED NOT DETECTED Final   Candida tropicalis NOT DETECTED NOT DETECTED Final   Cryptococcus neoformans/gattii NOT DETECTED NOT DETECTED Final   Vancomycin resistance NOT DETECTED NOT DETECTED Final    Comment: Performed at [facility], [address] [place], [place] [address]  Culture, blood (routine x 2)     Status: None   Collection Time: 01/25/21 11:21 AM   Specimen: BLOOD RIGHT HAND  Result Value Ref Range Status   Specimen Description BLOOD RIGHT HAND  Final   Special Requests   Final    BOTTLES DRAWN AEROBIC AND ANAEROBIC Blood Culture adequate volume   Culture   Final    NO GROWTH 5 DAYS Performed at [facility], [place] [address], [place], [place] [address]    Report Status 01/30/2021 FINAL  Final  Culture, blood (routine x 2)     Status: Abnormal   Collection Time: 01/25/21 11:41 AM   Specimen: BLOOD RIGHT HAND  Result Value Ref Range Status   Specimen  Description BLOOD RIGHT HAND  Final   Special Requests   Final    BOTTLES DRAWN AEROBIC ONLY Blood Culture adequate volume  Culture  Setup Time   Final    GRAM POSITIVE COCCI AEROBIC BOTTLE ONLY CRITICAL VALUE NOTED.  VALUE IS CONSISTENT WITH PREVIOUSLY REPORTED AND CALLED VALUE.    Culture (A)  Final    STAPHYLOCOCCUS EPIDERMIDIS THE SIGNIFICANCE OF ISOLATING THIS ORGANISM FROM A SINGLE SET OF BLOOD CULTURES WHEN MULTIPLE SETS ARE DRAWN IS UNCERTAIN. PLEASE NOTIFY THE MICROBIOLOGY DEPARTMENT WITHIN ONE WEEK IF SPECIATION AND SENSITIVITIES ARE REQUIRED. Performed at [facility], [place] [address], [place], [place] [address]    Report Status 01/27/2021 FINAL  Final    Radiology Studies: No results found.  Scheduled Meds:  bisacodyl  10 mg Rectal Once   polyethylene glycol  17 g Oral Daily   tamsulosin  0.4 mg Oral QPC breakfast   Continuous Infusions:     LOS: 14 days   Time spent: 26 minutes  Darliss Cheney, MD [facility]

## 2021-02-02 NOTE — Progress Notes (Signed)
     Patient is somnolent but easily awakens. No acute distress noted although brow furring noted. Denies shortness of breath. Continues to decline care, nutrition, and medications. No family at the bedside.   I spoke at length with patient's daughter [name]. Updates provided. Family is concerned about options and ability to return home with family support versus residential hospice facility. Education provided on each option and family is aware they may choose with an understanding that family is responsible for patient's care 24/7 with hospice support. Education provided on equipment needs and the ability for hospice to assist with any required items if they chose to take patient home. Daughter verbalized understanding.   Daughter is questioning if patient should continue to receive care given she appears to be more alert. Education provided on poor prognosis, patient is at level of comfort, and has expressed her wishes in the setting of being alert and oriented x4. Family is aware patient continues to refuse care which supports comfort focused care.   They are concerned patient is hallucinating with scheduled morphine. Advised she has not received over the past 24 hrs. Education provided on the use of fentanyl vs morphine. Daughter verbalized understanding and appreciation.   Family would like to continue discussions regarding disposition. Goals are clear to continue with comfort focused care while family makes final decisions. Daughter plans to call once decisions have been made. Advised she may also notify TOC with disposition decisions if no further medical questions.   All questions answered and support provided.   Assessment -AAO x3, ill appearing -diminished bilaterally -gen weakness  Plan -continue comfort focused care -d/c scheduled morphine -Fentanyl PRN for pain -Family having ongoing discussions regarding disposition decisions (home vs residential hospice). Plans to call with  final decisions. Detailed education and updates provided on patient's plan of care.  Time Total: 50 min.   Visit consisted of counseling and education dealing with the complex and emotionally intense issues of symptom management and palliative care in the setting of serious and potentially life-threatening illness.Greater than 50%  of this time was spent counseling and coordinating care related to the above assessment and plan.  Alda Lea, AGPCNP-BC  Palliative Medicine Team [phone]

## 2021-02-02 NOTE — Progress Notes (Signed)
Pt refused foley care and CHG bath.

## 2021-02-02 NOTE — Progress Notes (Signed)
Offered wound care, pt declined. Will try again at later time

## 2021-02-03 NOTE — Progress Notes (Signed)
Pt is refusing care. Pt stated did not want to be repositioned or perform foley care. Pt verbalizes reasons for these tasks to be performed. Will reevaluate later in shift. Pt remains on comfort measures.

## 2021-02-03 NOTE — Progress Notes (Signed)
PROGRESS NOTE    Carrie Monroe  WGY:[id number] DOB: [birth year] DOA: 01/18/2021 PCP: Ginger Organ., MD    Brief Narrative:  85 year old female with history of congestive heart failure, coronary artery disease status post stenting, atrial fibrillation tachybradycardia syndrome, diabetes, chronic kidney disease and anemia recently admitted to the hospital with weakness and pseudogout of the knee was discharged to a skilled nursing rehab 9 days ago brought back to the emergency room with elevated creatinine on routine exam at the skilled nursing facility.  Patient is poor historian.  Per Patient's she had been looking weak and tired however she has not noticed anything new.  In the emergency room CT scan abdomen pelvis showed prominent distention of the bladder with bilateral hydronephrosis and hydroureter, changes of hepatic cirrhosis with prominent splenic vein varices and splenomegaly.  Creatinine 2.3 and BUN 137.  Foley catheter was placed.  Admitted for further work-up.  Recent hospitalization 01/02/2021--01/09/2021 with severe fatigue generalized weakness-found to have acute superimposed on chronic diastolic heart failure as well as decompensated pulmonary hypertension--at that admission palliative care consulted.  Assessment & Plan:   Principal Problem:   ARF (acute renal failure) (HCC) Active Problems:   Essential hypertension   CAD- severe 3V CAD    Three-vessel CAD-PCI to LAD and LCx, med management of RCA   S/P TAVR (transcatheter aortic valve replacement)   Aortic stenosis, severe   Goals of care, counseling/discussion   Persistent atrial fibrillation (HCC)   AKI (acute kidney injury) ([place])   Acute encephalopathy   Subacute bacterial endocarditis   Cirrhosis of liver without ascites (HCC)   Obstructive uropathy  Acute kidney injury on chronic kidney disease stage 3a: Baseline creatinine about 1.5.  Presented with creatinine of 2.35.  CT abdomen at admission showed  prominent urinary bladder with bilateral hydronephrosis and hydroureter, probably postobstructive and dehydration.  Renal function now at baseline now.  Continue with Foley catheter.  Continue Flomax.  Acute metabolic encephalopathy in a patient with multiple medical problems: CT head unremarkable.  Likely metabolic from uremia and UTI.  Urea 135 on presentation.  Trending down.  Ammonia is normal. B 12 and folic acid is normal.  She is lethargic this morning at the time of examination.    Enterococcus bacteremia/bacterial endocarditis: Transthoracic echo shows large mitral valve vegetation/endocarditis.  Blood cultures are now growing Enterococcus faecalis.  ID consulted patient has been started on Rocephin and ampicillin. Patient seen by CT surgery.  Due to multiple comorbidities, she is deemed to be a poor surgical candidate so CT surgery did not recommend any surgery.  ID recommended 6 weeks of IV antibiotics.  Palliative care on board leading the communication with the family.  Patient herself decided to stop everything, transition to comfort care on 01/31/2021 and antibiotics were discontinued.  Awaiting further plans from palliative care to see if she would go home with hospice versus hospice facility.  UTI: Urinalysis with more than 80,000 Pseudomonas, received cefepime for 5 days, ended on 01/22/2021.  Elevated LFTs with CT scan concerning for hepatic cirrhosis: Hepatitis panel negative.  Mild abnormal LFTs, cardiology discontinued statin and started her on Zetia.  Patient also on amiodarone.  Per cardiology, this needs to be continued as she does not have any other options for her atrial fibrillation.  LFTs improving.  Prediabetes: Hemoglobin A1c 5.7.  Continue SSI.  Permanent A. fib on amiodarone and Eliquis: Continue amiodarone.  Therapeutic on Eliquis.  Repeat EKG consistent with chronic left bundle branch block  pattern.   Appreciate their help.  Chronic combined heart failure: Without  exacerbation.  Euvolemic other than some bilateral lower extremity edema but no pulmonary edema.  Received IV fluids.  Cardiology was consulted for this.  She received 1 dose of Lasix on 01/22/2021.  Renal function is stable.  No further diuretics planned by cardiology.  She is on Imdur and hydralazine.  Not a candidate for ACE/ARB/ARNI/MRA given CKD appreciate their help.  Essential hypertension: Blood pressure controlled.  Continue hydralazine and Imdur.  Hyperlipidemia: Crestor was switched to Zetia by cardiology on 01/23/2021 due to elevated LFTs and the need for continuation of amiodarone in order to protect liver  Debility: PT OT recommends SNF.  TOC on board for placement  GOC: Palliative care leading their communications with the family.  Finally patient herself asked to put her to hospice stating " I am ready to go to heaven" and she was subsequently transitioned to comfort care on 01/31/2021.  Per palliative care family to discuss their final plan about where they would like disposition for this patient.  TOC is consulted for residential hospice.  Code Status: DNR Family Communication: No family at the bedside today Disposition Plan: Status is: Inpatient.   Dispo: The patient is from: SNF              Anticipated d/c is to: Likely hospice              Patient currently is not medically stable.   Difficult to place patient No  Procedures:  None  Antimicrobials:  Rocephin 8/24--- 8/25 Cefepime 8/26--01/22/2021 Rocephin 01/24/2021> Ampicillin 01/24/2021>   Subjective: Patient seen and examined.  She is lethargic this morning.  Currently comfort care.    Objective: Vitals:   02/01/21 1316 02/02/21 0800 02/02/21 1057 02/02/21 2100  BP: (!) 112/40  (!) 120/36 (!) 123/32  Pulse: 72 80 75   Resp: (!) 26 (!) 24 19 20   Temp: 98 F (36.7 C)  97.7 F (36.5 C) (!) 97.5 F (36.4 C)  TempSrc: Oral  Axillary Axillary  SpO2: 98% 96% 99%   Weight:      Height:        Intake/Output  Summary (Last 24 hours) at 02/03/2021 1002 Last data filed at 02/03/2021 0600 Gross per 24 hour  Intake --  Output 975 ml  Net -975 ml    Filed Weights   01/18/21 1747 01/23/21 1239 01/30/21 0420  Weight: 77.4 kg 77.2 kg 82 kg    Examination: General exam: Ill-appearing female, lethargic this morning Respiratory system: Decreased breath sounds lower lobes Cardiovascular system: S1 & S2 heard, lower extremity edema Gastrointestinal system: Abdomen is nondistended, soft and nontender. Normal bowel sounds heard. Central nervous system: Lethargic. Extremities: Edema. Skin: No rashes Psychiatry: Lethargic today   Data Reviewed: I have personally reviewed following labs and imaging studies  CBC: Recent Labs  Lab 01/28/21 0119 01/29/21 0202 01/30/21 0059  WBC 6.0 7.0 6.5  NEUTROABS 4.7 6.7 5.2  HGB 7.5* 8.5* 7.4*  HCT 24.6* 28.5* 24.9*  MCV 99.6 102.5* 102.0*  PLT 91* 93* 102*    Basic Metabolic Panel: Recent Labs  Lab 01/29/21 0202  NA 146*  K 4.1  CL 119*  CO2 23  GLUCOSE 114*  BUN 42*  CREATININE 1.15*  CALCIUM 9.1  MG 2.2    GFR: Estimated Creatinine Clearance: 37.5 mL/min (A) (by C-G formula based on SCr of 1.15 mg/dL (H)). Liver Function Tests: No results for input(s): AST, ALT,  ALKPHOS, BILITOT, PROT, ALBUMIN in the last 168 hours.  No results for input(s): LIPASE, AMYLASE in the last 168 hours.  No results for input(s): AMMONIA in the last 168 hours.  Coagulation Profile: No results for input(s): INR, PROTIME in the last 168 hours.  Cardiac Enzymes: No results for input(s): CKTOTAL, CKMB, CKMBINDEX, TROPONINI in the last 168 hours. BNP (last 3 results) No results for input(s): PROBNP in the last 8760 hours. HbA1C: No results for input(s): HGBA1C in the last 72 hours.  CBG: Recent Labs  Lab 01/31/21 0055 01/31/21 0440 01/31/21 0803 01/31/21 1201 01/31/21 1659  GLUCAP 125* 94 95 130* 176*    Lipid Profile: No results for input(s): CHOL,  HDL, LDLCALC, TRIG, CHOLHDL, LDLDIRECT in the last 72 hours. Thyroid Function Tests: No results for input(s): TSH, T4TOTAL, FREET4, T3FREE, THYROIDAB in the last 72 hours. Anemia Panel: No results for input(s): VITAMINB12, FOLATE, FERRITIN, TIBC, IRON, RETICCTPCT in the last 72 hours.  Sepsis Labs: No results for input(s): PROCALCITON, LATICACIDVEN in the last 168 hours.  Recent Results (from the past 240 hour(s))  Culture, blood (routine x 2)     Status: None   Collection Time: 01/25/21 11:21 AM   Specimen: BLOOD RIGHT HAND  Result Value Ref Range Status   Specimen Description BLOOD RIGHT HAND  Final   Special Requests   Final    BOTTLES DRAWN AEROBIC AND ANAEROBIC Blood Culture adequate volume   Culture   Final    NO GROWTH 5 DAYS Performed at [facility], [address], [place], [place] [address]    Report Status 01/30/2021 FINAL  Final  Culture, blood (routine x 2)     Status: Abnormal   Collection Time: 01/25/21 11:41 AM   Specimen: BLOOD RIGHT HAND  Result Value Ref Range Status   Specimen Description BLOOD RIGHT HAND  Final   Special Requests   Final    BOTTLES DRAWN AEROBIC ONLY Blood Culture adequate volume   Culture  Setup Time   Final    GRAM POSITIVE COCCI AEROBIC BOTTLE ONLY CRITICAL VALUE NOTED.  VALUE IS CONSISTENT WITH PREVIOUSLY REPORTED AND CALLED VALUE.    Culture (A)  Final    STAPHYLOCOCCUS EPIDERMIDIS THE SIGNIFICANCE OF ISOLATING THIS ORGANISM FROM A SINGLE SET OF BLOOD CULTURES WHEN MULTIPLE SETS ARE DRAWN IS UNCERTAIN. PLEASE NOTIFY THE MICROBIOLOGY DEPARTMENT WITHIN ONE WEEK IF SPECIATION AND SENSITIVITIES ARE REQUIRED. Performed at [facility], [place] [address] [place], [place] [address]    Report Status 01/27/2021 FINAL  Final    Radiology Studies: No results found.  Scheduled Meds:  bisacodyl  10 mg Rectal Once   polyethylene glycol  17 g Oral Daily   tamsulosin  0.4 mg Oral QPC breakfast   Continuous Infusions:     LOS:  15 days   Yaakov Guthrie, MD [facility]

## 2021-02-03 NOTE — Progress Notes (Signed)
     Chart reviewed. Updates received from RN. Patient examined at the bedside. No family present. Patient is lethargic. She has been unable to safely take oral medications today. Will awaken during assessment with no verbal response to questions, immediately close eyes. Appears extremely tired and weak. Some accessory muscle use with respirations.   Continue comfort focused care.   I attempted to reach patient's daughter, [name] (designated point-of-contact) by family to provide updates. Unable to reach message left. Per discussions on yesterday family requesting to continue disposition discussions amongst themselves with a goal of making a final decision for hospice care at home with family support vs. A residential hospice facility. [name] was advised she can call myself back or our [facility] team with final decisions given focus is on disposition.   1433: Patient remains lethargic. No family at the bedside. Additional attempt made to reach daughter, but unsuccessful.   Assessment  -lethargic, comfort focused care, ill-appearing -RRR, bilateral lower extremity edema -diminished bilaterally  -Will not follow commands, lethargic  Plan -Continue with comfort focused care -Family pending decisions on disposition (home vs hospice facility) -PMT will continue to support and follow as needed. Please call for urgent needs.   Time Total: 25 min.   Visit consisted of counseling and education dealing with the complex and emotionally intense issues of symptom management and palliative care in the setting of serious and potentially life-threatening illness.Greater than 50%  of this time was spent counseling and coordinating care related to the above assessment and plan.  Alda Lea, AGPCNP-BC  Palliative Medicine Team [phone]

## 2021-02-03 NOTE — Progress Notes (Addendum)
Attempted on several occasions today to provide wound care. Pt declined wound care and she advised to leave her alone. Pt was very lethargic throughout shift. I did wake patient up to ask if she was in pain and pt would shake her head no.

## 2021-02-03 NOTE — Progress Notes (Signed)
Pt unable to wake up long enough to give medication safely.

## 2021-02-04 NOTE — Progress Notes (Signed)
     Chart reviewed. Updates received. Patient examined at the bedside. No family present. Patient is more awake and alert today. Continues to refuse care and appetite remains poor. Denies pain or shortness of breath. Ill appearing. Son, Jolayne Haines is at the bedside. Updates provided. He verbalizes understanding confirming family has been in close communication. He does not have any questions. Is appreciative patient is alert and able to speak with him on today. Her appetite remains poor.   Comfort focused care. Patient fluctuates with lethargy and being awake. She remains hospice appropriate. Family has been in discussion for several days. If family wishes to take patient home with hospice support this will be appropriate. Would defer TOC to have further discussions with family to confirm their wishes and assist with disposition as they are still undecided, but have been educated on options and expectations.   All questions answered and support provided.   Assessment  -awake, alert, ill appearing  -RRR, bilateral lower extremity edema -diminished bilaterally  -AAOx3, continues to refuse care  Plan -Continue with comfort focused care -Family pending decisions on disposition (home vs hospice facility). Will defer to [facility] for final disposition assistance. Family has been well educated on their options and continue to request time for final decisions.  -PMT will continue to support and follow as needed. May not see daily, given goals of care are set. Please call for urgent needs.   Time Total: 25 min.   Visit consisted of counseling and education dealing with the complex and emotionally intense issues of symptom management and palliative care in the setting of serious and potentially life-threatening illness.Greater than 50%  of this time was spent counseling and coordinating care related to the above assessment and plan.  Alda Lea, AGPCNP-BC  Palliative Medicine  Team [phone]

## 2021-02-04 NOTE — Progress Notes (Signed)
[facility] PROGRESS NOTE    Progress Note  Carrie Monroe  SWH:[id number] DOB: Jun 17, 1935 DOA: 01/18/2021 PCP: Ginger Organ., MD     Brief Narrative:   Carrie Monroe is an 85 y.o. female past medical history of systolic heart failure with an EF of 45% and grade 1 diastolic heart failure coronary disease status post stenting, atrial fibrillation tachybradycardia syndrome on Eliquis, with a history of severe aortic stenosis status post TAVR's in 2018 diabetes mellitus type 2 with a last A1c of 5.7, kidney disease stage III a, recently discharged from the hospital admitted to the hospital with weakness pseudogout generalized fatigue and acute on chronic diastolic heart failure, discharged back to skilled nursing facility brought back to the ED for an elevated creatinine was found to have acute urinary retention with bilateral hydronephrosis and hydroureter was found to have acute metabolic encephalopathy with bacterial enterococcal mitral valve endocarditis, deemed not a candidate surgical candidate.  She met with hospice and Perative care and she decided to move towards comfort care.   Assessment/Plan:   ARF (acute renal failure) (HCC) on chronic kidney disease stage IIIa: With a baseline creatinine of around 1.5-2.3. Probably postobstructive Foley was placed she was started on Flomax. Renal function has now improved to baseline follow-up with urology as an outpatient continue Foley catheter.  Acute metabolic encephalopathy: CT of the head unremarkable likely due to uremia and possible UTI.  Enterococcus bacteremia/bacterial endocarditis: Transthoracic echo showed mitral endocarditis. Blood cultures grew Enterococcus faecalis. ID was consulted recommended to start Rocephin and ampicillin. Patient seen by CT surgery due to multiple comorbidities she was deemed a poor candidate for surgical intervention. ID recommended 6 weeks of IV antibiotic. After meeting with  palliative Care the patient on 01/31/2021 decided to move towards comfort care all antibiotics were discontinued.  UTI: She completed 5-day course of IV cefepime.  Elevated LFTs: CT scan concerning for cirrhosis. As she decided to move towards comfort care all medications were discontinued that are not related to comfort.  Permanent atrial fibrillation: Before admission she was on amiodarone and Eliquis, this has been discontinued as we have moved to her comfort care.  Chronic combined systolic and diastolic heart failure: Without exacerbation #1 towards comfort care.  Essential hypertension: All antihypertensive medications have been DC'd stop checking vitals.  Hyperlipidemia: Statins were discontinued.  Goals of care: Patient decided to move towards comfort care on 01/31/2021 Perative care was consulted awaiting TOC placement to residential hospice facility.  Age 19 sacral decubitus ulcer present on admission RN Pressure Injury Documentation: Pressure Injury 03/03/20 Sacrum Mid Stage 2 -  Partial thickness loss of dermis presenting as a shallow open injury with a red, pink wound bed without slough. (Active)  03/03/20 0226  Location: Sacrum  Location Orientation: Mid  Staging: Stage 2 -  Partial thickness loss of dermis presenting as a shallow open injury with a red, pink wound bed without slough.  Wound Description (Comments):   Present on Admission: Yes     Pressure Injury 01/03/21 Thigh Posterior;Right Stage 2 -  Partial thickness loss of dermis presenting as a shallow open injury with a red, pink wound bed without slough. (Active)  01/03/21 0048  Location: Thigh  Location Orientation: Posterior;Right  Staging: Stage 2 -  Partial thickness loss of dermis presenting as a shallow open injury with a red, pink wound bed without slough.  Wound Description (Comments):   Present on Admission: Yes     Pressure Injury 01/03/21 Coccyx  Medial Unstageable - Full thickness tissue loss in  which the base of the injury is covered by slough (yellow, tan, gray, green or brown) and/or eschar (tan, brown or black) in the wound bed. (Active)  01/03/21 0050  Location: Coccyx  Location Orientation: Medial  Staging: Unstageable - Full thickness tissue loss in which the base of the injury is covered by slough (yellow, tan, gray, green or brown) and/or eschar (tan, brown or black) in the wound bed.  Wound Description (Comments):   Present on Admission: Yes    DVT prophylaxis: none Family Communication:daughter Status is: Inpatient  Remains inpatient appropriate because:Hemodynamically unstable  Dispo: The patient is from: SNF              Anticipated d/c is to:  Residential hospice              Patient currently is not medically stable to d/c.   Difficult to place patient No    Code Status:     Code Status Orders  (From admission, onward)           Start     Ordered   01/31/21 1656  Do not attempt resuscitation (DNR)  Continuous       Question Answer Comment  In the event of cardiac or respiratory ARREST Do not call a "code blue"   In the event of cardiac or respiratory ARREST Do not perform Intubation, CPR, defibrillation or ACLS   In the event of cardiac or respiratory ARREST Use medication by any route, position, wound care, and other measures to relive pain and suffering. May use oxygen, suction and manual treatment of airway obstruction as needed for comfort.      01/31/21 1657           Code Status History     Date Active Date Inactive Code Status Order ID Comments User Context   01/18/2021 2104 01/31/2021 1657 DNR [id number]  Rise Patience, MD ED   01/02/2021 1856 01/10/2021 0619 Full Code [id number]  Leslee Home, DO ED   03/03/2020 0259 03/18/2020 1639 Full Code [id number]  Vianne Bulls, MD Inpatient   11/21/2019 1657 11/28/2019 1915 Full Code [id number]  Kathlen Mody, Cadence H, PA-C ED   04/28/2019 0453 04/29/2019 1753 Full Code [id number]  Rise Patience, MD ED   03/13/2019 1603 03/16/2019 2211 Full Code [id number]  Serafina Mitchell, MD Inpatient   11/04/2017 1901 11/10/2017 1735 DNR [id number]  Karmen Bongo, MD Inpatient   04/03/2017 2327 04/05/2017 1856 Full Code [id number]  Vianne Bulls, MD ED   02/05/2017 1032 02/27/2017 2250 Full Code [id number]  Burnell Blanks, MD Inpatient   12/28/2016 1206 12/29/2016 1350 Full Code [id number]  Burnell Blanks, MD Inpatient   12/17/2016 1145 12/22/2016 1703 Full Code [id number]  Burnell Blanks, MD Inpatient   08/27/2013 1808 01/06/2016 1150 Full Code [id number]  Hennie Duos, MD Outpatient   08/03/2013 1048 08/05/2013 1850 Full Code [id number]  Aluisio, Dione Plover, MD Inpatient         IV Access:   Peripheral IV   Procedures and diagnostic studies:   No results found.   Medical Consultants:   None.   Subjective:    [name] sleepy and unresponsive this morning  Objective:    Vitals:   02/02/21 0800 02/02/21 1057 02/02/21 2100 02/03/21 1455  BP:  (!) 120/36 (!) 123/32 (!) 133/46  Pulse: 80 75  72  Resp: (!) 24 19 20  20  Temp:  97.7 F (36.5 C) (!) 97.5 F (36.4 C) 98.1 F (36.7 C)  TempSrc:  Axillary Axillary Oral  SpO2: 96% 99%    Weight:      Height:       SpO2: 99 %   Intake/Output Summary (Last 24 hours) at 02/04/2021 0754 Last data filed at 02/04/2021 0431 Gross per 24 hour  Intake 480 ml  Output 750 ml  Net -270 ml   Filed Weights   01/18/21 1747 01/23/21 1239 01/30/21 0420  Weight: 77.4 kg 77.2 kg 82 kg    Exam: General exam: In no acute distress. Respiratory system: Good air movement and clear to auscultation. Cardiovascular system: S1 & S2 heard, RRR. No JVD. Gastrointestinal system: Abdomen is nondistended, soft and nontender.  Extremities: She is anasarca Skin: No rashes, lesions or ulcers   Data Reviewed:    Labs: Basic Metabolic Panel: Recent Labs  Lab 01/29/21 0202  NA 146*  K 4.1  CL 119*  CO2 23  GLUCOSE  114*  BUN 42*  CREATININE 1.15*  CALCIUM 9.1  MG 2.2   GFR Estimated Creatinine Clearance: 37.5 mL/min (A) (by C-G formula based on SCr of 1.15 mg/dL (H)). Liver Function Tests: No results for input(s): AST, ALT, ALKPHOS, BILITOT, PROT, ALBUMIN in the last 168 hours. No results for input(s): LIPASE, AMYLASE in the last 168 hours. No results for input(s): AMMONIA in the last 168 hours. Coagulation profile No results for input(s): INR, PROTIME in the last 168 hours. COVID-19 Labs  No results for input(s): DDIMER, FERRITIN, LDH, CRP in the last 72 hours.  Lab Results  Component Value Date   SARSCOV2NAA NEGATIVE 01/18/2021   SARSCOV2NAA NEGATIVE 01/09/2021   SARSCOV2NAA NEGATIVE 01/02/2021   [place] NEGATIVE 03/02/2020    CBC: Recent Labs  Lab 01/29/21 0202 01/30/21 0059  WBC 7.0 6.5  NEUTROABS 6.7 5.2  HGB 8.5* 7.4*  HCT 28.5* 24.9*  MCV 102.5* 102.0*  PLT 93* 102*   Cardiac Enzymes: No results for input(s): CKTOTAL, CKMB, CKMBINDEX, TROPONINI in the last 168 hours. BNP (last 3 results) No results for input(s): PROBNP in the last 8760 hours. CBG: Recent Labs  Lab 01/31/21 0055 01/31/21 0440 01/31/21 0803 01/31/21 1201 01/31/21 1659  GLUCAP 125* 94 95 130* 176*   D-Dimer: No results for input(s): DDIMER in the last 72 hours. Hgb A1c: No results for input(s): HGBA1C in the last 72 hours. Lipid Profile: No results for input(s): CHOL, HDL, LDLCALC, TRIG, CHOLHDL, LDLDIRECT in the last 72 hours. Thyroid function studies: No results for input(s): TSH, T4TOTAL, T3FREE, THYROIDAB in the last 72 hours.  Invalid input(s): FREET3 Anemia work up: No results for input(s): VITAMINB12, FOLATE, FERRITIN, TIBC, IRON, RETICCTPCT in the last 72 hours. Sepsis Labs: Recent Labs  Lab 01/29/21 0202 01/30/21 0059  WBC 7.0 6.5   Microbiology Recent Results (from the past 240 hour(s))  Culture, blood (routine x 2)     Status: None   Collection Time: 01/25/21 11:21 AM    Specimen: BLOOD RIGHT HAND  Result Value Ref Range Status   Specimen Description BLOOD RIGHT HAND  Final   Special Requests   Final    BOTTLES DRAWN AEROBIC AND ANAEROBIC Blood Culture adequate volume   Culture   Final    NO GROWTH 5 DAYS Performed at [facility], [address], [place], [place] [address]    Report Status 01/30/2021 FINAL  Final  Culture, blood (routine x 2)  Status: Abnormal   Collection Time: 01/25/21 11:41 AM   Specimen: BLOOD RIGHT HAND  Result Value Ref Range Status   Specimen Description BLOOD RIGHT HAND  Final   Special Requests   Final    BOTTLES DRAWN AEROBIC ONLY Blood Culture adequate volume   Culture  Setup Time   Final    GRAM POSITIVE COCCI AEROBIC BOTTLE ONLY CRITICAL VALUE NOTED.  VALUE IS CONSISTENT WITH PREVIOUSLY REPORTED AND CALLED VALUE.    Culture (A)  Final    STAPHYLOCOCCUS EPIDERMIDIS THE SIGNIFICANCE OF ISOLATING THIS ORGANISM FROM A SINGLE SET OF BLOOD CULTURES WHEN MULTIPLE SETS ARE DRAWN IS UNCERTAIN. PLEASE NOTIFY THE MICROBIOLOGY DEPARTMENT WITHIN ONE WEEK IF SPECIATION AND SENSITIVITIES ARE REQUIRED. Performed at [facility], [place] [address] [place], [place] [address]    Report Status 01/27/2021 FINAL  Final     Medications:    bisacodyl  10 mg Rectal Once   polyethylene glycol  17 g Oral Daily   tamsulosin  0.4 mg Oral QPC breakfast   Continuous Infusions:    LOS: 16 days   Charlynne Cousins  [facility]  02/04/2021, 7:54 AM

## 2021-02-05 NOTE — TOC Progression Note (Signed)
Transition of Care [facility]) - Progression Note    Patient Details  Name: Carrie Monroe MRN: [id number] Date of Birth: [birth year]  Transition of Care [facility]) CM/SW [place], [place] Phone Number: [phone] 02/05/2021, 11:23 AM  Clinical Narrative:     CSW spoke with daughter [name] in regards to residential options. [name] explained that she has more family coming into town today and they want to make the decision as a family. She asked if she could receive a follow up call tomorrow.  CSW let [name] know that the weekday CSW would follow up in regards to choice facility.  TOC team will continue to assist with discharge planning needs.   Expected Discharge Plan: [place] Barriers to Discharge: Continued Medical Work up  Expected Discharge Plan and Services Expected Discharge Plan: [place] In-house Referral: Clinical Social Work     Living arrangements for the past 2 months: [place]                                       Social Determinants of Health (SDOH) Interventions    Readmission Risk Interventions Readmission Risk Prevention Plan 03/17/2020  Transportation Screening Complete  PCP or Specialist Appt within 3-5 Days Complete  HRI or [place] Complete  Social Work Consult for [place] Planning/Counseling Complete  Palliative Care Screening Not Applicable  Medication Review Press photographer) Complete  Some recent data might be hidden

## 2021-02-05 NOTE — Progress Notes (Signed)
[facility] PROGRESS NOTE    Progress Note  Carrie Monroe  SWH:[id number] DOB: Jun 17, 1935 DOA: 01/18/2021 PCP: Ginger Organ., MD     Brief Narrative:   Carrie Monroe is an 85 y.o. female past medical history of systolic heart failure with an EF of 45% and grade 1 diastolic heart failure coronary disease status post stenting, atrial fibrillation tachybradycardia syndrome on Eliquis, with a history of severe aortic stenosis status post TAVR's in 2018 diabetes mellitus type 2 with a last A1c of 5.7, kidney disease stage III a, recently discharged from the hospital admitted to the hospital with weakness pseudogout generalized fatigue and acute on chronic diastolic heart failure, discharged back to skilled nursing facility brought back to the ED for an elevated creatinine was found to have acute urinary retention with bilateral hydronephrosis and hydroureter was found to have acute metabolic encephalopathy with bacterial enterococcal mitral valve endocarditis, deemed not a candidate surgical candidate.  She met with hospice and Perative care and she decided to move towards comfort care.   Assessment/Plan:   ARF (acute renal failure) (HCC) on chronic kidney disease stage IIIa: With a baseline creatinine of around 1.5-2.3. Probably postobstructive Foley was placed she was started on Flomax. Renal function has now improved to baseline follow-up with urology as an outpatient continue Foley catheter.  Acute metabolic encephalopathy: CT of the head unremarkable likely due to uremia and possible UTI.  Enterococcus bacteremia/bacterial endocarditis: Transthoracic echo showed mitral endocarditis. Blood cultures grew Enterococcus faecalis. ID was consulted recommended to start Rocephin and ampicillin. Patient seen by CT surgery due to multiple comorbidities she was deemed a poor candidate for surgical intervention. ID recommended 6 weeks of IV antibiotic. After meeting with  palliative Care the patient on 01/31/2021 decided to move towards comfort care all antibiotics were discontinued.  UTI: She completed 5-day course of IV cefepime.  Elevated LFTs: CT scan concerning for cirrhosis. As she decided to move towards comfort care all medications were discontinued that are not related to comfort.  Permanent atrial fibrillation: Before admission she was on amiodarone and Eliquis, this has been discontinued as we have moved to her comfort care.  Chronic combined systolic and diastolic heart failure: Without exacerbation #1 towards comfort care.  Essential hypertension: All antihypertensive medications have been DC'd stop checking vitals.  Hyperlipidemia: Statins were discontinued.  Goals of care: Patient decided to move towards comfort care on 01/31/2021 Perative care was consulted awaiting TOC placement to residential hospice facility.  Age 19 sacral decubitus ulcer present on admission RN Pressure Injury Documentation: Pressure Injury 03/03/20 Sacrum Mid Stage 2 -  Partial thickness loss of dermis presenting as a shallow open injury with a red, pink wound bed without slough. (Active)  03/03/20 0226  Location: Sacrum  Location Orientation: Mid  Staging: Stage 2 -  Partial thickness loss of dermis presenting as a shallow open injury with a red, pink wound bed without slough.  Wound Description (Comments):   Present on Admission: Yes     Pressure Injury 01/03/21 Thigh Posterior;Right Stage 2 -  Partial thickness loss of dermis presenting as a shallow open injury with a red, pink wound bed without slough. (Active)  01/03/21 0048  Location: Thigh  Location Orientation: Posterior;Right  Staging: Stage 2 -  Partial thickness loss of dermis presenting as a shallow open injury with a red, pink wound bed without slough.  Wound Description (Comments):   Present on Admission: Yes     Pressure Injury 01/03/21 Coccyx  Medial Unstageable - Full thickness tissue loss in  which the base of the injury is covered by slough (yellow, tan, gray, green or brown) and/or eschar (tan, brown or black) in the wound bed. (Active)  01/03/21 0050  Location: Coccyx  Location Orientation: Medial  Staging: Unstageable - Full thickness tissue loss in which the base of the injury is covered by slough (yellow, tan, gray, green or brown) and/or eschar (tan, brown or black) in the wound bed.  Wound Description (Comments):   Present on Admission: Yes    DVT prophylaxis: none Family Communication:daughter Status is: Inpatient  Remains inpatient appropriate because:Hemodynamically unstable  Dispo: The patient is from: SNF              Anticipated d/c is to:  Residential hospice              Patient currently is not medically stable to d/c.   Difficult to place patient No    Code Status:     Code Status Orders  (From admission, onward)           Start     Ordered   01/31/21 1656  Do not attempt resuscitation (DNR)  Continuous       Question Answer Comment  In the event of cardiac or respiratory ARREST Do not call a "code blue"   In the event of cardiac or respiratory ARREST Do not perform Intubation, CPR, defibrillation or ACLS   In the event of cardiac or respiratory ARREST Use medication by any route, position, wound care, and other measures to relive pain and suffering. May use oxygen, suction and manual treatment of airway obstruction as needed for comfort.      01/31/21 1657           Code Status History     Date Active Date Inactive Code Status Order ID Comments User Context   01/18/2021 2104 01/31/2021 1657 DNR [id number]  Rise Patience, MD ED   01/02/2021 1856 01/10/2021 0619 Full Code [id number]  Leslee Home, DO ED   03/03/2020 0259 03/18/2020 1639 Full Code [id number]  Vianne Bulls, MD Inpatient   11/21/2019 1657 11/28/2019 1915 Full Code [id number]  Kathlen Mody, Cadence H, PA-C ED   04/28/2019 0453 04/29/2019 1753 Full Code [id number]  Rise Patience, MD ED   03/13/2019 1603 03/16/2019 2211 Full Code [id number]  Serafina Mitchell, MD Inpatient   11/04/2017 1901 11/10/2017 1735 DNR [id number]  Karmen Bongo, MD Inpatient   04/03/2017 2327 04/05/2017 1856 Full Code [id number]  Vianne Bulls, MD ED   02/05/2017 1032 02/27/2017 2250 Full Code [id number]  Burnell Blanks, MD Inpatient   12/28/2016 1206 12/29/2016 1350 Full Code [id number]  Burnell Blanks, MD Inpatient   12/17/2016 1145 12/22/2016 1703 Full Code [id number]  Burnell Blanks, MD Inpatient   08/27/2013 1808 01/06/2016 1150 Full Code [id number]  Hennie Duos, MD Outpatient   08/03/2013 1048 08/05/2013 1850 Full Code [id number]  Aluisio, Dione Plover, MD Inpatient         IV Access:   Peripheral IV   Procedures and diagnostic studies:   No results found.   Medical Consultants:   None.   Subjective:    [name] sleepy and unresponsive this morning  Objective:    Vitals:   02/02/21 2100 02/03/21 1455 02/04/21 1426 02/04/21 2131  BP: (!) 123/32 (!) 133/46 (!) 115/39 (!) 128/36  Pulse:  72 69 76  Resp: 20 20 20  20  Temp: (!) 97.5 F (36.4 C) 98.1 F (36.7 C) 97.7 F (36.5 C) 97.7 F (36.5 C)  TempSrc: Axillary Oral Oral Oral  SpO2:      Weight:      Height:       SpO2: 99 %   Intake/Output Summary (Last 24 hours) at 02/05/2021 0923 Last data filed at 02/04/2021 1330 Gross per 24 hour  Intake 480 ml  Output --  Net 480 ml    Filed Weights   01/18/21 1747 01/23/21 1239 01/30/21 0420  Weight: 77.4 kg 77.2 kg 82 kg    Exam: General exam: In no acute distress. Respiratory system: Good air movement and clear to auscultation. Cardiovascular system: S1 & S2 heard, RRR. No JVD. Gastrointestinal system: Abdomen is nondistended, soft and nontender.  Extremities: She is anasarca Skin: No rashes, lesions or ulcers   Data Reviewed:    Labs: Basic Metabolic Panel: No results for input(s): NA, K, CL, CO2, GLUCOSE, BUN, CREATININE,  CALCIUM, MG, PHOS in the last 168 hours.  GFR Estimated Creatinine Clearance: 37.5 mL/min (A) (by C-G formula based on SCr of 1.15 mg/dL (H)). Liver Function Tests: No results for input(s): AST, ALT, ALKPHOS, BILITOT, PROT, ALBUMIN in the last 168 hours. No results for input(s): LIPASE, AMYLASE in the last 168 hours. No results for input(s): AMMONIA in the last 168 hours. Coagulation profile No results for input(s): INR, PROTIME in the last 168 hours. COVID-19 Labs  No results for input(s): DDIMER, FERRITIN, LDH, CRP in the last 72 hours.  Lab Results  Component Value Date   SARSCOV2NAA NEGATIVE 01/18/2021   SARSCOV2NAA NEGATIVE 01/09/2021   SARSCOV2NAA NEGATIVE 01/02/2021   [place] NEGATIVE 03/02/2020    CBC: Recent Labs  Lab 01/30/21 0059  WBC 6.5  NEUTROABS 5.2  HGB 7.4*  HCT 24.9*  MCV 102.0*  PLT 102*    Cardiac Enzymes: No results for input(s): CKTOTAL, CKMB, CKMBINDEX, TROPONINI in the last 168 hours. BNP (last 3 results) No results for input(s): PROBNP in the last 8760 hours. CBG: Recent Labs  Lab 01/31/21 0055 01/31/21 0440 01/31/21 0803 01/31/21 1201 01/31/21 1659  GLUCAP 125* 94 95 130* 176*    D-Dimer: No results for input(s): DDIMER in the last 72 hours. Hgb A1c: No results for input(s): HGBA1C in the last 72 hours. Lipid Profile: No results for input(s): CHOL, HDL, LDLCALC, TRIG, CHOLHDL, LDLDIRECT in the last 72 hours. Thyroid function studies: No results for input(s): TSH, T4TOTAL, T3FREE, THYROIDAB in the last 72 hours.  Invalid input(s): FREET3 Anemia work up: No results for input(s): VITAMINB12, FOLATE, FERRITIN, TIBC, IRON, RETICCTPCT in the last 72 hours. Sepsis Labs: Recent Labs  Lab 01/30/21 0059  WBC 6.5    Microbiology No results found for this or any previous visit (from the past 240 hour(s)).    Medications:    bisacodyl  10 mg Rectal Once   polyethylene glycol  17 g Oral Daily   tamsulosin  0.4 mg Oral QPC  breakfast   Continuous Infusions:    LOS: 17 days   Charlynne Cousins  [facility]  02/05/2021, 9:23 AM

## 2021-02-06 DIAGNOSIS — Z515 Encounter for palliative care: Secondary | ICD-10-CM

## 2021-02-06 NOTE — TOC Progression Note (Addendum)
Transition of Care [facility]) - Progression Note    Patient Details  Name: Carrie Monroe MRN: [id number] Date of Birth: [birth year]  Transition of Care [facility]) CM/SW [place], [place] Phone Number: 02/06/2021, 11:47 AM  Clinical Narrative:     Update 5:05pm- CSW received call from [place] with [facility]. Chrislyn confirmed they can accept patient for [facility]. Chrislyn confirmed they will have bed available tomorrow. CSW will follow up with Chrislyn in the am.    Update 3:45pm- CSW received callback from patients daughter [name]. [name] confirmed she would like for CSW to make referral to [facility] for [employer]. CSW called [facility] and spoke with Bevely [name]. CSW made referral for patient. Bevely [name] confirmed she will follow up with CSW on status of referral as well as bed availability. CSW will continue to follow. CSW called to follow up on Residential Hospice options with patients daughter [name]. CSW left voicemail and awaiting call back. CSW will continue to follow and assist with dc planning needs.  Expected Discharge Plan: [place] Barriers to Discharge: Continued Medical Work up  Expected Discharge Plan and Services Expected Discharge Plan: [place] In-house Referral: Clinical Social Work     Living arrangements for the past 2 months: [place]                                       Social Determinants of Health (SDOH) Interventions    Readmission Risk Interventions Readmission Risk Prevention Plan 03/17/2020  Transportation Screening Complete  PCP or Specialist Appt within 3-5 Days Complete  HRI or [place] Complete  Social Work Consult for [place] Planning/Counseling Complete  Palliative Care Screening Not Applicable  Medication Review Press photographer) Complete  Some recent data might be hidden

## 2021-02-06 NOTE — Progress Notes (Signed)
Manufacturing engineer [facility]) Hospital Liaison note.    Chart reviewed and eligibility confirmed for [facility]. Spoke with family to confirm interest and explain services. Family agreeable to tcomplete consents today for transfer in the morning.  [facility] will notify TOC when registration paperwork has been completed to arrange transport.   Thank you for the opportunity to participate in this patient's care. Domenic Moras, BSN, RN [facility] Liaison (listed on [place] under [facility])    [phone] (24h on call)

## 2021-02-06 NOTE — Progress Notes (Signed)
[facility] PROGRESS NOTE    Progress Note  Carrie Monroe  MWN:[id number] DOB: 09-Jul-1935 DOA: 01/18/2021 PCP: Ginger Organ., MD     Brief Narrative:   Carrie Monroe is an 85 y.o. female past medical history of systolic heart failure with an EF of 45% and grade 1 diastolic heart failure coronary disease status post stenting, atrial fibrillation tachybradycardia syndrome on Eliquis, with a history of severe aortic stenosis status post TAVR's in 2018 diabetes mellitus type 2 with a last A1c of 5.7, kidney disease stage III a, recently discharged from the hospital admitted to the hospital with weakness pseudogout generalized fatigue and acute on chronic diastolic heart failure, discharged back to skilled nursing facility brought back to the ED for an elevated creatinine was found to have acute urinary retention with bilateral hydronephrosis and hydroureter was found to have acute metabolic encephalopathy with bacterial enterococcal mitral valve endocarditis, deemed not a candidate surgical candidate.  She met with hospice and Perative care and she decided to move towards comfort care.   Assessment/Plan:   ARF (acute renal failure) (HCC) on chronic kidney disease stage IIIa: Acute metabolic encephalopathy:  Enterococcus bacteremia/bacterial endocarditis: Transthoracic echo showed mitral endocarditis. ID was consulted recommended to start Rocephin and ampicillin. Patient seen by CT surgery due to multiple comorbidities she was deemed a poor candidate for surgical intervention. ID recommended 6 weeks of IV antibiotic. After meeting with palliative Care the patient on 01/31/2021 decided to move towards comfort care all antibiotics were discontinued.  UTI: She completed 5-day course of IV cefepime.  Elevated LFTs: As she decided to move towards comfort care all medications were discontinued that are not related to comfort.  Permanent atrial fibrillation: Before  admission she was on amiodarone and Eliquis, this has been discontinued as we have moved to her comfort care.  Chronic combined systolic and diastolic heart failure: Without exacerbation #1 towards comfort care.  Essential hypertension: All antihypertensive medications have been DC'd stop checking vitals.  Hyperlipidemia: Statins were discontinued.  Goals of care: Patient decided to move towards comfort care on 01/31/2021, palliative care was consulted awaiting TOC placement to residential hospice facility.  DVT prophylaxis: none Family Communication:daughter Status is: Inpatient  Remains inpatient appropriate because:Hemodynamically unstable  Dispo: The patient is from: SNF              Anticipated d/c is to:  Residential hospice              Patient currently is not medically stable to d/c.   Difficult to place patient No    Code Status:     Code Status Orders  (From admission, onward)           Start     Ordered   01/31/21 1656  Do not attempt resuscitation (DNR)  Continuous       Question Answer Comment  In the event of cardiac or respiratory ARREST Do not call a "code blue"   In the event of cardiac or respiratory ARREST Do not perform Intubation, CPR, defibrillation or ACLS   In the event of cardiac or respiratory ARREST Use medication by any route, position, wound care, and other measures to relive pain and suffering. May use oxygen, suction and manual treatment of airway obstruction as needed for comfort.      01/31/21 1657           Code Status History     Date Active Date Inactive Code Status Order ID Comments  User Context   01/18/2021 2104 01/31/2021 1657 DNR [id number]  Rise Patience, MD ED   01/02/2021 1856 01/10/2021 0619 Full Code [id number]  Leslee Home, DO ED   03/03/2020 0259 03/18/2020 1639 Full Code [id number]  Vianne Bulls, MD Inpatient   11/21/2019 1657 11/28/2019 1915 Full Code [id number]  Antony Madura, PA-C ED   04/28/2019 0453  04/29/2019 1753 Full Code [id number]  Rise Patience, MD ED   03/13/2019 1603 03/16/2019 2211 Full Code [id number]  Serafina Mitchell, MD Inpatient   11/04/2017 1901 11/10/2017 1735 DNR [id number]  Karmen Bongo, MD Inpatient   04/03/2017 2327 04/05/2017 1856 Full Code [id number]  Vianne Bulls, MD ED   02/05/2017 1032 02/27/2017 2250 Full Code [id number]  Burnell Blanks, MD Inpatient   12/28/2016 1206 12/29/2016 1350 Full Code [id number]  Burnell Blanks, MD Inpatient   12/17/2016 1145 12/22/2016 1703 Full Code [id number]  Burnell Blanks, MD Inpatient   08/27/2013 1808 01/06/2016 1150 Full Code [id number]  Hennie Duos, MD Outpatient   08/03/2013 1048 08/05/2013 1850 Full Code [id number]  Gearlean Alf, MD Inpatient         IV Access:   Peripheral IV   Procedures and diagnostic studies:   No results found.   Medical Consultants:   None.   Subjective:    [name] sleepy unresponsive  Objective:    Vitals:   02/04/21 1426 02/04/21 2131 02/05/21 1300 02/05/21 1948  BP: (!) 115/39 (!) 128/36 (!) 132/34 (!) 126/34  Pulse: 69 76 74 77  Resp: _0 Temp: 97.7 F (36.5 C) 97.7 F (36.5 C) 99.6 F (37.6 C) (!) 100.6 F (38.1 C)  TempSrc: Oral Oral Axillary Axillary  SpO2:   97% 94%  Weight:      Height:       SpO2: 94 %   Intake/Output Summary (Last 24 hours) at 02/06/2021 1134 Last data filed at 02/06/2021 0616 Gross per 24 hour  Intake --  Output 600 ml  Net -600 ml    Filed Weights   01/18/21 1747 01/23/21 1239 01/30/21 0420  Weight: 77.4 kg 77.2 kg 82 kg    Exam: General exam: In no acute distress. Respiratory system: Good air movement and clear to auscultation. Cardiovascular system: S1 & S2 heard, RRR. No JVD. Gastrointestinal system: Abdomen is nondistended, soft and nontender.  Extremities: She is anasarca Skin: No rashes, lesions or ulcers   Data Reviewed:    Labs: Basic Metabolic Panel: No results for  input(s): NA, K, CL, CO2, GLUCOSE, BUN, CREATININE, CALCIUM, MG, PHOS in the last 168 hours.  GFR Estimated Creatinine Clearance: 37.5 mL/min (A) (by C-G formula based on SCr of 1.15 mg/dL (H)). Liver Function Tests: No results for input(s): AST, ALT, ALKPHOS, BILITOT, PROT, ALBUMIN in the last 168 hours. No results for input(s): LIPASE, AMYLASE in the last 168 hours. No results for input(s): AMMONIA in the last 168 hours. Coagulation profile No results for input(s): INR, PROTIME in the last 168 hours. COVID-19 Labs  No results for input(s): DDIMER, FERRITIN, LDH, CRP in the last 72 hours.  Lab Results  Component Value Date   SARSCOV2NAA NEGATIVE 01/18/2021   SARSCOV2NAA NEGATIVE 01/09/2021   SARSCOV2NAA NEGATIVE 01/02/2021   [place] NEGATIVE 03/02/2020    CBC: No results for input(s): WBC, NEUTROABS, HGB, HCT, MCV, PLT in the last 168 hours.  Cardiac Enzymes: No results for input(s): CKTOTAL, CKMB, CKMBINDEX, TROPONINI in the last  168 hours. BNP (last 3 results) No results for input(s): PROBNP in the last 8760 hours. CBG: Recent Labs  Lab 01/31/21 0055 01/31/21 0440 01/31/21 0803 01/31/21 1201 01/31/21 1659  GLUCAP 125* 94 95 130* 176*    D-Dimer: No results for input(s): DDIMER in the last 72 hours. Hgb A1c: No results for input(s): HGBA1C in the last 72 hours. Lipid Profile: No results for input(s): CHOL, HDL, LDLCALC, TRIG, CHOLHDL, LDLDIRECT in the last 72 hours. Thyroid function studies: No results for input(s): TSH, T4TOTAL, T3FREE, THYROIDAB in the last 72 hours.  Invalid input(s): FREET3 Anemia work up: No results for input(s): VITAMINB12, FOLATE, FERRITIN, TIBC, IRON, RETICCTPCT in the last 72 hours. Sepsis Labs: No results for input(s): PROCALCITON, WBC, LATICACIDVEN in the last 168 hours.  Microbiology No results found for this or any previous visit (from the past 240 hour(s)).    Medications:    bisacodyl  10 mg Rectal Once    polyethylene glycol  17 g Oral Daily   tamsulosin  0.4 mg Oral QPC breakfast   Continuous Infusions:    LOS: 18 days   Charlynne Cousins  [facility]  02/06/2021, 11:34 AM

## 2021-02-06 NOTE — Progress Notes (Signed)
   Subjective Chart reviewed. Updates received. Patient examined at the bedside. Her daughter [name] and sister Jackelyn Poling are present at the bedside.  Discussed patient's lethargy and overall comfort. She is able to acknowledge family's presence; remains asleep during my visit. Discussed lack of sedatives administered since 9/10 and expectations for clearance of these medications. Reviewed the [facility] referral process, family-oriented environment, availability of beds today, and the expert care available 24/7. Family is appreciative of this information and will continue discussions throughout the day. They are aware that PMT is available for further discussion of options and TOC is available to facilitate a transfer when family has made a decision.  Questions and concerns addressed. PMT will continue to support holistically.    Exam -sleeping, ill appearing  -RRR on room air -bilateral lower extremity edema  Assessment -End of life care  Plan -Continue with comfort focused care -Family is leaning towards [facility], will continue discussions today -PMT will continue to support and follow as needed  Time Total: 15 min.  Greater than 50% of this time was spent in counseling and coordinating care related to the above assessment and plan.  Dorthy Cooler, PA-C Palliative Medicine Team Team phone # [phone]  Thank you for allowing the Palliative Medicine Team to assist in the care of this patient. Please utilize secure chat with additional questions, if there is no response within 30 minutes please call the above phone number.  Palliative Medicine Team providers are available by phone from 7am to 7pm daily and can be reached through the team cell phone.  Should this patient require assistance outside of these hours, please call the patient's attending physician.

## 2021-02-07 MED ORDER — GLYCOPYRROLATE 0.2 MG/ML IJ SOLN: 0.3000 mg | INTRAMUSCULAR | Status: AC | PRN

## 2021-02-07 MED ORDER — FENTANYL CITRATE PF 50 MCG/ML IJ SOSY: 25.0000 ug | PREFILLED_SYRINGE | INTRAMUSCULAR | 0 refills | Status: AC | PRN

## 2021-02-07 NOTE — Discharge Summary (Signed)
Physician Discharge Summary  Carrie Monroe QVZ:[id number] DOB: January 20, 1936 DOA: 01/18/2021  PCP: Ginger Organ., MD  Admit date: 01/18/2021 Discharge date: 02/07/2021  Admitted From: home Disposition: Residential hospice facility  Recommendations for Outpatient Follow-up:  Will go to residential hospice facility.  Home Health:no Equipment/Devices:none  Discharge Condition:Hospice CODE STATUS:DNR Diet recommendation: Heart Healthy   Brief/Interim Summary: 85 y.o. female past medical history of systolic heart failure with an EF of 45% and grade 1 diastolic heart failure coronary disease status post stenting, atrial fibrillation tachybradycardia syndrome on Eliquis, with a history of severe aortic stenosis status post TAVR's in 2018 diabetes mellitus type 2 with a last A1c of 5.7, kidney disease stage III a, recently discharged from the hospital admitted to the hospital with weakness pseudogout generalized fatigue and acute on chronic diastolic heart failure, discharged back to skilled nursing facility brought back to the ED for an elevated creatinine was found to have acute urinary retention with bilateral hydronephrosis and hydroureter was found to have acute metabolic encephalopathy with bacterial enterococcal mitral valve endocarditis, deemed not a candidate surgical candidate.  She met with hospice and palliative care and she decided to move towards comfort care.  Discharge Diagnoses:  Principal Problem:   ARF (acute renal failure) (HCC) Active Problems:   Essential hypertension   CAD- severe 3V CAD    Three-vessel CAD-PCI to LAD and LCx, med management of RCA   S/P TAVR (transcatheter aortic valve replacement)   Aortic stenosis, severe   End of life care   Persistent atrial fibrillation (HCC)   AKI (acute kidney injury) ([place])   Acute encephalopathy   Subacute bacterial endocarditis   Cirrhosis of liver without ascites (HCC)   Obstructive uropathy  Acute kidney injury  on chronic kidney disease stage III yea With a baseline creatinine 1.5-2.3. Probably postobstructive uropathy Foley was placed started on Flomax renal function improved.  Acute metabolic encephalopathy: CT scan of the head was unremarkable.  Enterococcus bacteremia/bacterial endocarditis: Transthoracic echo showed mitral endocarditis. Blood cultures grew Enterococcus faecalis. ID was consulted recommended starting Rocephin and ampicillin. Seen by CT surgery and deemed not a candidate for surgery due to multiple comorbidities and after meeting with palliative care on 01/31/2021 the patient decided to move towards comfort care all antibiotics and labs were discontinued she was moved towards comfort care, she family decided to transfer the patient to a residential hospice facility.  UTI: She completed a 5-day course of antibiotics: Elevated LFTs: CT scan of the abdomen pelvis concerning for cirrhosis.  After the patient decided to move towards comfort care no further work-up were performed.  Combined chronic systolic and diastolic heart failure: Essential hypertension Hyperlipidemia   Stage II sacral decubitus ulcer present on admission:  Discharge Instructions  Discharge Instructions     [facility] pharmacist to adjust dose for Vancomycin, Aminoglycosides and other anti-infective therapies as requested by physician.   Complete by: As directed    [facility] to provide Cath Flo 12m   Complete by: As directed    Administer for PICC line occlusion and as ordered by physician for other access device issues.   Anaphylaxis Kit: Provided to treat any anaphylactic reaction to the medication being provided to the patient if First Dose or when requested by physician   Complete by: As directed    Epinephrine 139mml vial / amp: Administer 0.13m38m0.13ml20mubcutaneously once for moderate to severe anaphylaxis, nurse to call physician and pharmacy when reaction occurs and call  [phone] if needed for immediate care   Diphenhydramine 61m/ml IV vial: Administer 25-513mIV/IM PRN for first dose reaction, rash, itching, mild reaction, nurse to call physician and pharmacy when reaction occurs   Sodium Chloride 0.9% NS 50087mV: Administer if needed for hypovolemic blood pressure drop or as ordered by physician after call to physician with anaphylactic reaction   Change dressing on IV access line weekly and PRN   Complete by: As directed    Diet - low sodium heart healthy   Complete by: As directed    Flush IV access with Sodium Chloride 0.9% and Heparin 10 units/ml or 100 units/ml   Complete by: As directed    Home infusion instructions - [facility]   Complete by: As directed    Instructions: Flush IV access with Sodium Chloride 0.9% and Heparin 10units/ml or 100units/ml   Change dressing on IV access line: Weekly and PRN   Instructions Cath Flo 2mg4mdminister for PICC Line occlusion and as ordered by physician for other access device   [facility] pharmacist to adjust dose for: Vancomycin, Aminoglycosides and other anti-infective therapies as requested by physician   Increase activity slowly   Complete by: As directed    Method of administration may be changed at the discretion of home infusion pharmacist based upon assessment of the patient and/or caregiver's ability to self-administer the medication ordered   Complete by: As directed    No wound care   Complete by: As directed       Allergies as of 02/07/2021       Reactions   Oxycodone Other (See Comments)   Hallunication        Medication List     STOP taking these medications    acetaminophen 325 MG tablet Commonly known as: TYLENOL   amiodarone 200 MG tablet Commonly known as: PACERONE   apixaban 2.5 MG Tabs tablet Commonly known as: ELIQUIS   colchicine 0.6 MG tablet   COLLAGEN PO   diclofenac Sodium 1 % Gel Commonly known as: VOLTAREN   feeding supplement Liqd    ferrous sulfate 325 (65 FE) MG EC tablet   furosemide 40 MG tablet Commonly known as: LASIX   multivitamin with minerals Tabs tablet   rosuvastatin 10 MG tablet Commonly known as: CRESTOR   traMADol 50 MG tablet Commonly known as: ULTRAM   VITAMIN B 12 PO   vitamin C 1000 MG tablet   Vitamin D 50 MCG (2000 UT) tablet       TAKE these medications    fentaNYL 50 MCG/ML injection Commonly known as: SUBLIMAZE Inject 0.5 mLs (25 mcg total) into the vein every 2 (two) hours as needed for severe pain or moderate pain.   glycopyrrolate 0.2 MG/ML injection Commonly known as: ROBINUL Inject 1.5 mLs (0.3 mg total) into the vein every 4 (four) hours as needed (excessive secretions).               Discharge Care Instructions  (From admission, onward)           Start     Ordered   02/07/21 0000  Change dressing on IV access line weekly and PRN  (Home infusion instructions - [facility] )        02/07/21 1022            Allergies  Allergen Reactions   Oxycodone Other (See Comments)    Hallunication    Consultations: CT surgery Infectious disease Palliative  care   Procedures/Studies: CT Abdomen Pelvis Wo Contrast  Result Date: 01/18/2021 CLINICAL DATA:  Acute abdominal pain, nonlocalized. Elevated BUN. Abdominal pain. Bedsores. EXAM: CT ABDOMEN AND PELVIS WITHOUT CONTRAST TECHNIQUE: Multidetector CT imaging of the abdomen and pelvis was performed following the standard protocol without IV contrast. COMPARISON:  06/26/2017 FINDINGS: Lower chest: Lung bases are clear. Multiple calcifications in the left breast with circumscribed peripherally calcified central lesion measuring 4.1 cm diameter. These changes appear to have been present on the previous study and likely related to treatment of previous left breast cancer. Cardiac enlargement with cardiac valve prosthesis. Hepatobiliary: Cirrhotic changes suggested in the liver with enlarged lateral  segment left and caudate lobes and nodular contour to the liver. No focal liver abnormality is seen. Status post cholecystectomy. No biliary dilatation. Pancreas: Unremarkable. No pancreatic ductal dilatation or surrounding inflammatory changes. Spleen: Spleen is enlarged. Prominent splenic artery calcifications. Prominent splenic vein varices. Adrenals/Urinary Tract: No adrenal gland nodules. Bilateral hydronephrosis with significant hydroureter. The bladder is severely distended. Hydronephrosis and hydroureter likely result from reflux. No definite obstructing lesion. Small intrarenal stones are demonstrated bilaterally. Stomach/Bowel: Stomach, small bowel, and colon are mostly decompressed. The rectal wall appears thickened which may indicate proctitis. No pericolonic stranding. Appendix is not identified. Vascular/Lymphatic: Calcification of the aorta. No significant lymphadenopathy. Reproductive: Uterus and bilateral adnexa are unremarkable. Other: Small amount of free fluid in the pelvis.  No free air. Musculoskeletal: Degenerative changes in the spine and hips. No destructive bone lesions. Old rib fractures. IMPRESSION: 1. Prominent distention of the bladder with bilateral hydronephrosis and hydroureter likely representing neurogenic bladder with bilateral reflux. 2. Small bilateral nonobstructing intrarenal stones. 3. Changes of hepatic cirrhosis with splenic enlargement and prominent splenic vein varices. 4. Aortic atherosclerosis. Electronically Signed   By: Lucienne Capers M.D.   On: 01/18/2021 19:25   CT HEAD WO CONTRAST (5MM)  Result Date: 01/18/2021 CLINICAL DATA:  Mental status changes EXAM: CT HEAD WITHOUT CONTRAST TECHNIQUE: Contiguous axial images were obtained from the base of the skull through the vertex without intravenous contrast. COMPARISON:  02/17/2019 FINDINGS: Brain: There is atrophy and chronic small vessel disease changes. No acute intracranial abnormality. Specifically, no  hemorrhage, hydrocephalus, mass lesion, acute infarction, or significant intracranial injury. Vascular: No hyperdense vessel or unexpected calcification. Skull: No acute calvarial abnormality. Sinuses/Orbits: No acute findings Other: None IMPRESSION: Atrophy, chronic microvascular disease. No acute intracranial abnormality. Electronically Signed   By: Rolm Baptise M.D.   On: 01/18/2021 21:18   US Abdomen Complete  Result Date: 01/23/2021 CLINICAL DATA:  History of cirrhosis EXAM: ABDOMEN ULTRASOUND COMPLETE COMPARISON:  01/18/2021 FINDINGS: Gallbladder: Surgically removed Common bile duct: Diameter: 5.5 mm Liver: Minimal nodularity of the liver is noted without focal mass. These changes are consistent with the given clinical history of cirrhosis. Portal vein is patent on color Doppler imaging with normal direction of blood flow towards the liver. IVC: No abnormality visualized. Pancreas: Visualized portion unremarkable. Spleen: Prominent at 14.8 cm. Right Kidney: Length: 11.9 cm. Echogenicity within normal limits. No mass or hydronephrosis visualized. 2.9 cm cyst is noted in the mid to upper pole. Previously seen hydronephrotic changes have resolved likely related to bladder decompression. Left Kidney: Length: 10.6 cm. Mild cortical thinning is noted. No mass lesion is noted. Hydronephrosis has resolved related to bladder decompression. Abdominal aorta: No aneurysm visualized. Other findings: None. IMPRESSION: Mild changes of cirrhosis. Right renal cysts stable from prior CT. Resolution of previously seen bilateral hydronephrosis following bladder decompression. Electronically Signed  By: Inez Catalina M.D.   On: 01/23/2021 08:20   ECHOCARDIOGRAM COMPLETE  Result Date: 01/23/2021    ECHOCARDIOGRAM REPORT   Patient Name:   ELLAN TESS Date of Exam: 01/23/2021 Medical Rec #:  [id number]           Height:       62.0 in Accession #:    [id number]          Weight:       170.2 lb Date of Birth:  02-25-1936            BSA:          1.785 m Patient Age:    85 years            BP:           120/41 mmHg Patient Gender: F                   HR:           66 bpm. Exam Location:  Inpatient Procedure: 2D Echo, Cardiac Doppler and Color Doppler Indications:    CHF  History:        Patient has prior history of Echocardiogram examinations, most                 recent 11/22/2019. 84mm medtronic valve in the aortic position,                 Arrythmias:Atrial Fibrillation; Risk Factors:Hypertension,                 Diabetes and Dyslipidemia. H/O bacteremia.  Sonographer:    Merrie Roof RDCS Referring Phys: [id number] [place]  1. Left ventricular ejection fraction, by estimation, is 45 to 50%. The left ventricle has mildly decreased function. The left ventricle demonstrates global hypokinesis with septal-lateral dyssynchrony due to LBBB. There is moderate left ventricular hypertrophy. Left ventricular diastolic parameters are consistent with Grade I diastolic dysfunction (impaired relaxation).  2. Right ventricular systolic function is normal. The right ventricular size is normal. Tricuspid regurgitation signal is inadequate for assessing PA pressure.  3. Left atrial size was moderately dilated.  4. Right atrial size was moderately dilated.  5. There is a large 1.45 x 1.45 cm mobile, well-circumscribed mass present on the atrial surface at the base of the posterior mitral valve leaflet. This certainly looks like it could be a myxoma but was was not present on 6/21 echo. Cannot rule out vegetation/endocarditis. The mitral valve is degenerative. Mild to moderate mitral valve regurgitation. No evidence of mitral stenosis. Moderate mitral annular calcification.  6. There is a 26 mm Medtronic CoreValve-EvolutR prosthetic (TAVR) valve present in the aortic position. Mean gradient mildly elevated at 17 mmHg. No perivalvular leakage noted. The aortic valve has been repaired/replaced. Aortic valve regurgitation is not  visualized.  7. The inferior vena cava is normal in size with greater than 50% respiratory variability, suggesting right atrial pressure of 3 mmHg. FINDINGS  Left Ventricle: Left ventricular ejection fraction, by estimation, is 45 to 50%. The left ventricle has mildly decreased function. The left ventricle demonstrates global hypokinesis. The left ventricular internal cavity size was normal in size. There is  moderate left ventricular hypertrophy. Left ventricular diastolic parameters are consistent with Grade I diastolic dysfunction (impaired relaxation). Right Ventricle: The right ventricular size is normal. No increase in right ventricular wall thickness. Right ventricular systolic function is normal. Tricuspid regurgitation signal is inadequate for assessing PA pressure.  Left Atrium: Left atrial size was moderately dilated. Right Atrium: Right atrial size was moderately dilated. Pericardium: There is no evidence of pericardial effusion. Mitral Valve: There is a large 1.45 x 1.45 cm mobile, well-circumscribed mass present on the atrial surface at the base of the posterior mitral valve leaflet. This certainly looks like it could be a myxoma but was was not present on 6/21 echo. Cannot rule out vegetation/endocarditis. The mitral valve is degenerative in appearance. There is moderate calcification of the mitral valve leaflet(s). Moderate mitral annular calcification. Mild to moderate mitral valve regurgitation. No evidence of mitral valve stenosis. Tricuspid Valve: The tricuspid valve is normal in structure. Tricuspid valve regurgitation is not demonstrated. Aortic Valve: There is a 26 mm Medtronic CoreValve-EvolutR prosthetic (TAVR) valve present in the aortic position. Mean gradient mildly elevated at 17 mmHg. No perivalvular leakage noted. The aortic valve has been repaired/replaced. Aortic valve regurgitation is not visualized. Aortic valve mean gradient measures 17.0 mmHg. Aortic valve peak gradient measures  32.9 mmHg. Aortic valve area, by VTI measures 0.98 cm. There is a 26 mm Medtronic-Hall stented (TAVR) valve present in the aortic position. Pulmonic Valve: The pulmonic valve was normal in structure. Pulmonic valve regurgitation is trivial. Aorta: The aortic root is normal in size and structure. Venous: The inferior vena cava is normal in size with greater than 50% respiratory variability, suggesting right atrial pressure of 3 mmHg. IAS/Shunts: No atrial level shunt detected by color flow Doppler.  LEFT VENTRICLE PLAX 2D LVIDd:         4.70 cm      Diastology LVIDs:         3.40 cm      LV e' medial:  6.20 cm/s LV PW:         1.40 cm      LV e' lateral: 5.11 cm/s LV IVS:        1.40 cm LVOT diam:     1.70 cm LV SV:         56 LV SV Index:   31 LVOT Area:     2.27 cm  LV Volumes (MOD) LV vol d, MOD A4C: 123.0 ml LV vol s, MOD A4C: 41.0 ml LV SV MOD A4C:     123.0 ml RIGHT VENTRICLE RV Basal diam:  4.70 cm RV Mid diam:    3.70 cm LEFT ATRIUM              Index       RIGHT ATRIUM           Index LA diam:        3.80 cm  2.13 cm/m  RA Area:     31.10 cm LA Vol (A2C):   108.0 ml 60.51 ml/m RA Volume:   105.00 ml 58.82 ml/m LA Vol (A4C):   56.6 ml  31.71 ml/m LA Biplane Vol: 80.7 ml  45.21 ml/m  AORTIC VALVE AV Area (Vmax):    0.96 cm AV Area (Vmean):   0.93 cm AV Area (VTI):     0.98 cm AV Vmax:           287.00 cm/s AV Vmean:          189.000 cm/s AV VTI:            0.566 m AV Peak Grad:      32.9 mmHg AV Mean Grad:      17.0 mmHg LVOT Vmax:         122.00 cm/s  LVOT Vmean:        77.600 cm/s LVOT VTI:          0.245 m LVOT/AV VTI ratio: 0.43  AORTA Ao Root diam: 3.10 cm MR Peak grad:    105.9 mmHg MR Vmax:         514.50 cm/s SHUNTS MR PISA:         1.01 cm    Systemic VTI:  0.24 m MR PISA Eff ROA: 6 mm       Systemic Diam: 1.70 cm MR PISA Radius:  0.40 cm Dalton McleanMD Electronically signed by Franki Monte Signature Date/Time: 01/23/2021/5:22:40 PM    Final    (Echo, Carotid, EGD, Colonoscopy, ERCP)     Subjective: Nonverbal  Discharge Exam: Vitals:   02/06/21 2100 02/07/21 0801  BP: (!) 130/33 (!) 119/35  Pulse: 78 70  Resp: 18   Temp: (!) 101 F (38.3 C) 99.7 F (37.6 C)  SpO2: 95% 95%   Vitals:   02/05/21 1948 02/06/21 1627 02/06/21 2100 02/07/21 0801  BP: (!) 126/34 (!) 131/47 (!) 130/33 (!) 119/35  Pulse: 77 72 78 70  Resp: _0 Temp: (!) 100.6 F (38.1 C) (!) 101.2 F (38.4 C) (!) 101 F (38.3 C) 99.7 F (37.6 C)  TempSrc: Axillary Axillary Axillary Oral  SpO2: 94% 95% 95% 95%  Weight:      Height:        General: Pt is alert, awake, not in acute distress Cardiovascular: RRR, S1/S2 +, no rubs, no gallops Respiratory: CTA bilaterally, no wheezing, no rhonchi Abdominal: Soft, NT, ND, bowel sounds + Extremities: no edema, no cyanosis    The results of significant diagnostics from this hospitalization (including imaging, microbiology, ancillary and laboratory) are listed below for reference.     Microbiology: No results found for this or any previous visit (from the past 240 hour(s)).   Labs: BNP (last 3 results) Recent Labs    03/02/20 1936 01/02/21 1209  BNP 529.1* 962.9*   Basic Metabolic Panel: No results for input(s): NA, K, CL, CO2, GLUCOSE, BUN, CREATININE, CALCIUM, MG, PHOS in the last 168 hours. Liver Function Tests: No results for input(s): AST, ALT, ALKPHOS, BILITOT, PROT, ALBUMIN in the last 168 hours. No results for input(s): LIPASE, AMYLASE in the last 168 hours. No results for input(s): AMMONIA in the last 168 hours. CBC: No results for input(s): WBC, NEUTROABS, HGB, HCT, MCV, PLT in the last 168 hours. Cardiac Enzymes: No results for input(s): CKTOTAL, CKMB, CKMBINDEX, TROPONINI in the last 168 hours. BNP: Invalid input(s): POCBNP CBG: Recent Labs  Lab 01/31/21 1201 01/31/21 1659  GLUCAP 130* 176*   D-Dimer No results for input(s): DDIMER in the last 72 hours. Hgb A1c No results for input(s): HGBA1C in the last  72 hours. Lipid Profile No results for input(s): CHOL, HDL, LDLCALC, TRIG, CHOLHDL, LDLDIRECT in the last 72 hours. Thyroid function studies No results for input(s): TSH, T4TOTAL, T3FREE, THYROIDAB in the last 72 hours.  Invalid input(s): FREET3 Anemia work up No results for input(s): VITAMINB12, FOLATE, FERRITIN, TIBC, IRON, RETICCTPCT in the last 72 hours. Urinalysis    Component Value Date/Time   COLORURINE YELLOW 01/18/2021 1827   APPEARANCEUR CLEAR 01/18/2021 1827   LABSPEC 1.012 01/18/2021 1827   PHURINE 5.0 01/18/2021 1827   GLUCOSEU NEGATIVE 01/18/2021 1827   HGBUR NEGATIVE 01/18/2021 1827   BILIRUBINUR NEGATIVE 01/18/2021 1827   KETONESUR NEGATIVE 01/18/2021 1827   PROTEINUR NEGATIVE 01/18/2021 1827  UROBILINOGEN 1.0 07/28/2013 1305   NITRITE NEGATIVE 01/18/2021 1827   LEUKOCYTESUR LARGE (A) 01/18/2021 1827   Sepsis Labs Invalid input(s): PROCALCITONIN,  WBC,  LACTICIDVEN Microbiology No results found for this or any previous visit (from the past 240 hour(s)).   SIGNED:   Charlynne Cousins, MD  [facility] 02/07/2021, 10:22 AM Pager   If 7PM-7AM, please contact night-coverage [email or web address] Password [email or web address]

## 2021-02-07 NOTE — TOC Transition Note (Signed)
Transition of Care [facility]) - CM/SW Discharge Note   Patient Details  Name: Carrie Monroe MRN: [id number] Date of Birth: [birth year]  Transition of Care [facility]) CM/SW Contact:  Trula Ore, [place] Phone Number: 02/07/2021, 1:06 PM   Clinical Narrative:     Patient will DC to: [facility]  Anticipated DC date: 02/07/2021  Family notified: [name]  Transport by: Corey Harold  ?  Per MD patient ready for DC to [facility] . RN, patient, patient's family, Judeen Hammans with [facility], and facility notified of DC. Discharge Summary sent to facility. RN given number for report tele# [phone]. DC packet on chart. DNR signed by MD attached to patients DC packet.Ambulance transport requested for patient.  CSW signing off.   Final next level of care: [place] ([place]) Barriers to Discharge: No Barriers Identified   Patient Goals and CMS Choice   CMS Medicare.gov Compare Post Acute Care list provided to:: Patient Represenative (must comment) (patients daughter [name]) Choice offered to / list presented to : Adult Children [name])  Discharge Placement              Patient chooses bed at:  [facility]) Patient to be transferred to facility by: [place] Name of family member notified: [name] Patient and family notified of of transfer: 02/07/21  Discharge Plan and Services In-house Referral: Clinical Social Work                                   Social Determinants of Health (SDOH) Interventions     Readmission Risk Interventions Readmission Risk Prevention Plan 03/17/2020  Transportation Screening Complete  PCP or Specialist Appt within 3-5 Days Complete  HRI or [place] Complete  Social Work Consult for [place] Planning/Counseling Complete  Palliative Care Screening Not Applicable  Medication Review Press photographer) Complete  Some recent data might be hidden

## 2021-02-07 NOTE — Progress Notes (Signed)
Manufacturing engineer [facility]) Hospital Liaison Note:  Patient family has completed the registration paper work and [employer] is ready to receive this patient. TOC aware.  Please have the bedside RN call report to [phone].  Please arrange transport to arrive as soon as possible with OOF DNR paperwork with patient.  Thank you for allowing [place] to participate in this patient's care,  Gar Ponto, RN [facility] HLT [phone]

## 2021-02-07 NOTE — Progress Notes (Signed)
Nutrition Brief Note  Chart reviewed. Pt now transitioning to comfort care. Per PMT notes, plan to transfer to residential hospice [facility]) today No further nutrition interventions planned at this time.  Please re-consult as needed.   Loistine Chance, RD, LDN, [place] Registered Dietitian II Certified Diabetes Care and Education Specialist Please refer to [facility] for RD and/or RD on-call/weekend/after hours pager

## 2021-02-07 NOTE — Progress Notes (Signed)
Report called to Transport planner at [facility].

## 2021-02-07 NOTE — Plan of Care (Signed)
  Problem: Health Behavior/Discharge Planning: Goal: Ability to manage health-related needs will improve Outcome: Adequate for Discharge   Problem: Clinical Measurements: Goal: Ability to maintain clinical measurements within normal limits will improve Outcome: Adequate for Discharge Goal: Will remain free from infection Outcome: Adequate for Discharge Goal: Diagnostic test results will improve Outcome: Adequate for Discharge Goal: Respiratory complications will improve Outcome: Adequate for Discharge Goal: Cardiovascular complication will be avoided Outcome: Adequate for Discharge   Problem: Activity: Goal: Risk for activity intolerance will decrease Outcome: Adequate for Discharge   Problem: Nutrition: Goal: Adequate nutrition will be maintained Outcome: Adequate for Discharge   Problem: Coping: Goal: Level of anxiety will decrease Outcome: Adequate for Discharge   Problem: Elimination: Goal: Will not experience complications related to bowel motility Outcome: Adequate for Discharge Goal: Will not experience complications related to urinary retention Outcome: Adequate for Discharge   Problem: Pain Managment: Goal: General experience of comfort will improve Outcome: Adequate for Discharge   Problem: Safety: Goal: Ability to remain free from injury will improve Outcome: Adequate for Discharge   Problem: Skin Integrity: Goal: Risk for impaired skin integrity will decrease Outcome: Adequate for Discharge   Problem: Urinary Elimination: Goal: Signs and symptoms of infection will decrease Outcome: Adequate for Discharge

## 2021-02-25 DEATH — deceased
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
## 2021-03-13 ENCOUNTER — Ambulatory Visit: Payer: Medicare Other | Admitting: General Practice
